# Patient Record
Sex: Female | Born: 1940 | Race: White | Hispanic: No | Smoking: Never smoker
Health system: Southern US, Community
[De-identification: ages and names within clinical notes are randomized; demographics above are authoritative.]

## PROBLEM LIST (undated history)

## (undated) DIAGNOSIS — G8929 Other chronic pain: Secondary | ICD-10-CM

## (undated) DIAGNOSIS — J8410 Pulmonary fibrosis, unspecified: Secondary | ICD-10-CM

## (undated) DIAGNOSIS — F419 Anxiety disorder, unspecified: Secondary | ICD-10-CM

## (undated) DIAGNOSIS — M109 Gout, unspecified: Secondary | ICD-10-CM

## (undated) DIAGNOSIS — I5032 Chronic diastolic (congestive) heart failure: Secondary | ICD-10-CM

## (undated) DIAGNOSIS — I10 Essential (primary) hypertension: Secondary | ICD-10-CM

## (undated) DIAGNOSIS — Z9981 Dependence on supplemental oxygen: Secondary | ICD-10-CM

## (undated) DIAGNOSIS — M1990 Unspecified osteoarthritis, unspecified site: Secondary | ICD-10-CM

## (undated) DIAGNOSIS — H409 Unspecified glaucoma: Secondary | ICD-10-CM

## (undated) DIAGNOSIS — I519 Heart disease, unspecified: Secondary | ICD-10-CM

## (undated) DIAGNOSIS — R7303 Prediabetes: Secondary | ICD-10-CM

## (undated) DIAGNOSIS — I4891 Unspecified atrial fibrillation: Secondary | ICD-10-CM

## (undated) DIAGNOSIS — J449 Chronic obstructive pulmonary disease, unspecified: Secondary | ICD-10-CM

## (undated) DIAGNOSIS — M069 Rheumatoid arthritis, unspecified: Secondary | ICD-10-CM

## (undated) DIAGNOSIS — F329 Major depressive disorder, single episode, unspecified: Secondary | ICD-10-CM

## (undated) DIAGNOSIS — I5031 Acute diastolic (congestive) heart failure: Secondary | ICD-10-CM

## (undated) DIAGNOSIS — G4730 Sleep apnea, unspecified: Secondary | ICD-10-CM

## (undated) DIAGNOSIS — K5790 Diverticulosis of intestine, part unspecified, without perforation or abscess without bleeding: Secondary | ICD-10-CM

## (undated) DIAGNOSIS — E785 Hyperlipidemia, unspecified: Secondary | ICD-10-CM

## (undated) DIAGNOSIS — I499 Cardiac arrhythmia, unspecified: Secondary | ICD-10-CM

## (undated) DIAGNOSIS — F32A Depression, unspecified: Secondary | ICD-10-CM

## (undated) DIAGNOSIS — G629 Polyneuropathy, unspecified: Secondary | ICD-10-CM

## (undated) DIAGNOSIS — N1831 Chronic kidney disease, stage 3a: Secondary | ICD-10-CM

## (undated) DIAGNOSIS — H3530 Unspecified macular degeneration: Secondary | ICD-10-CM

## (undated) DIAGNOSIS — K219 Gastro-esophageal reflux disease without esophagitis: Secondary | ICD-10-CM

## (undated) DIAGNOSIS — M545 Low back pain, unspecified: Secondary | ICD-10-CM

## (undated) DIAGNOSIS — E039 Hypothyroidism, unspecified: Secondary | ICD-10-CM

## (undated) DIAGNOSIS — R0600 Dyspnea, unspecified: Secondary | ICD-10-CM

## (undated) DIAGNOSIS — Z9289 Personal history of other medical treatment: Secondary | ICD-10-CM

## (undated) DIAGNOSIS — Z950 Presence of cardiac pacemaker: Secondary | ICD-10-CM

## (undated) HISTORY — PX: LUMBAR LAMINECTOMY: SHX95

## (undated) HISTORY — DX: Major depressive disorder, single episode, unspecified: F32.9

## (undated) HISTORY — PX: TOTAL ABDOMINAL HYSTERECTOMY: SHX209

## (undated) HISTORY — DX: Hyperlipidemia, unspecified: E78.5

## (undated) HISTORY — DX: Chronic kidney disease, stage 3a: N18.31

## (undated) HISTORY — DX: Depression, unspecified: F32.A

## (undated) HISTORY — PX: DILATION AND CURETTAGE OF UTERUS: SHX78

## (undated) HISTORY — PX: BACK SURGERY: SHX140

## (undated) HISTORY — DX: Gastro-esophageal reflux disease without esophagitis: K21.9

## (undated) HISTORY — DX: Chronic diastolic (congestive) heart failure: I50.32

## (undated) HISTORY — DX: Unspecified osteoarthritis, unspecified site: M19.90

## (undated) HISTORY — DX: Essential (primary) hypertension: I10

## (undated) HISTORY — DX: Diverticulosis of intestine, part unspecified, without perforation or abscess without bleeding: K57.90

## (undated) HISTORY — DX: Unspecified glaucoma: H40.9

## (undated) HISTORY — PX: NEPHRECTOMY TRANSPLANTED ORGAN: SUR880

## (undated) HISTORY — DX: Anxiety disorder, unspecified: F41.9

## (undated) HISTORY — DX: Hypothyroidism, unspecified: E03.9

## (undated) HISTORY — PX: TUBAL LIGATION: SHX77

## (undated) HISTORY — DX: Heart disease, unspecified: I51.9

## (undated) HISTORY — PX: BREAST LUMPECTOMY: SHX2

## (undated) HISTORY — PX: CATARACT EXTRACTION W/ INTRAOCULAR LENS  IMPLANT, BILATERAL: SHX1307

---

## 1981-04-21 DIAGNOSIS — Z9289 Personal history of other medical treatment: Secondary | ICD-10-CM

## 1981-04-21 HISTORY — DX: Personal history of other medical treatment: Z92.89

## 1981-04-21 HISTORY — PX: NEPHRECTOMY LIVING DONOR: SUR877

## 1997-10-17 ENCOUNTER — Encounter: Admission: RE | Admit: 1997-10-17 | Discharge: 1997-10-17 | Payer: Self-pay | Admitting: Internal Medicine

## 2000-03-16 ENCOUNTER — Encounter: Admission: RE | Admit: 2000-03-16 | Discharge: 2000-03-16 | Payer: Self-pay | Admitting: Internal Medicine

## 2001-01-18 ENCOUNTER — Encounter: Admission: RE | Admit: 2001-01-18 | Discharge: 2001-01-18 | Payer: Self-pay | Admitting: Internal Medicine

## 2001-03-12 ENCOUNTER — Encounter: Admission: RE | Admit: 2001-03-12 | Discharge: 2001-03-12 | Payer: Self-pay | Admitting: Internal Medicine

## 2001-03-12 ENCOUNTER — Encounter: Payer: Self-pay | Admitting: Internal Medicine

## 2001-03-12 ENCOUNTER — Ambulatory Visit (HOSPITAL_COMMUNITY): Admission: RE | Admit: 2001-03-12 | Discharge: 2001-03-12 | Payer: Self-pay | Admitting: Internal Medicine

## 2002-06-21 ENCOUNTER — Ambulatory Visit (HOSPITAL_COMMUNITY): Admission: RE | Admit: 2002-06-21 | Discharge: 2002-06-21 | Payer: Self-pay | Admitting: Family Medicine

## 2003-02-01 ENCOUNTER — Encounter: Admission: RE | Admit: 2003-02-01 | Discharge: 2003-02-01 | Payer: Self-pay | Admitting: Internal Medicine

## 2003-02-14 ENCOUNTER — Encounter: Admission: RE | Admit: 2003-02-14 | Discharge: 2003-02-14 | Payer: Self-pay | Admitting: Internal Medicine

## 2003-02-22 ENCOUNTER — Encounter (INDEPENDENT_AMBULATORY_CARE_PROVIDER_SITE_OTHER): Payer: Self-pay | Admitting: Specialist

## 2003-02-22 ENCOUNTER — Other Ambulatory Visit: Admission: RE | Admit: 2003-02-22 | Discharge: 2003-02-22 | Payer: Self-pay | Admitting: Internal Medicine

## 2003-02-22 ENCOUNTER — Encounter: Admission: RE | Admit: 2003-02-22 | Discharge: 2003-02-22 | Payer: Self-pay | Admitting: Internal Medicine

## 2006-12-25 ENCOUNTER — Ambulatory Visit: Payer: Self-pay | Admitting: Internal Medicine

## 2007-01-12 ENCOUNTER — Ambulatory Visit (HOSPITAL_COMMUNITY): Admission: RE | Admit: 2007-01-12 | Discharge: 2007-01-12 | Payer: Self-pay | Admitting: Internal Medicine

## 2007-01-12 ENCOUNTER — Ambulatory Visit: Payer: Self-pay | Admitting: Cardiology

## 2007-01-14 ENCOUNTER — Encounter: Payer: Self-pay | Admitting: Critical Care Medicine

## 2007-01-14 ENCOUNTER — Ambulatory Visit: Payer: Self-pay | Admitting: Internal Medicine

## 2007-01-14 LAB — CONVERTED CEMR LAB
Anti Nuclear Antibody(ANA): NEGATIVE
BUN: 14 mg/dL (ref 6–23)
Basophils Absolute: 0.1 10*3/uL (ref 0.0–0.1)
Basophils Relative: 0.8 % (ref 0.0–1.0)
CO2: 32 meq/L (ref 19–32)
Calcium: 9.7 mg/dL (ref 8.4–10.5)
Chloride: 107 meq/L (ref 96–112)
Creatinine, Ser: 0.9 mg/dL (ref 0.4–1.2)
Eosinophils Absolute: 0.2 10*3/uL (ref 0.0–0.6)
Eosinophils Relative: 2.5 % (ref 0.0–5.0)
GFR calc Af Amer: 81 mL/min
GFR calc non Af Amer: 67 mL/min
Glucose, Bld: 93 mg/dL (ref 70–99)
HCT: 37 % (ref 36.0–46.0)
Hemoglobin: 13.3 g/dL (ref 12.0–15.0)
Lymphocytes Relative: 23.9 % (ref 12.0–46.0)
MCHC: 36 g/dL (ref 30.0–36.0)
MCV: 89.4 fL (ref 78.0–100.0)
Monocytes Absolute: 0.6 10*3/uL (ref 0.2–0.7)
Monocytes Relative: 6.4 % (ref 3.0–11.0)
Neutro Abs: 5.7 10*3/uL (ref 1.4–7.7)
Neutrophil cytoplasmic antibodies,IgG,serum: <1:20 {titer}
Neutrophils Relative %: 66.4 % (ref 43.0–77.0)
Platelets: 268 10*3/uL (ref 150–400)
Potassium: 4.6 meq/L (ref 3.5–5.1)
RBC: 4.14 M/uL (ref 3.87–5.11)
RDW: 12.8 % (ref 11.5–14.6)
Rhuematoid fact SerPl-aCnc: <20 intl units/mL — ABNORMAL LOW (ref 0.0–20.0)
Sodium: 146 meq/L — ABNORMAL HIGH (ref 135–145)
TSH: 4.49 microintl units/mL (ref 0.35–5.50)
WBC: 8.7 10*3/uL (ref 4.5–10.5)

## 2007-02-08 DIAGNOSIS — I10 Essential (primary) hypertension: Secondary | ICD-10-CM | POA: Insufficient documentation

## 2007-02-08 DIAGNOSIS — R059 Cough, unspecified: Secondary | ICD-10-CM | POA: Insufficient documentation

## 2007-02-08 DIAGNOSIS — R05 Cough: Secondary | ICD-10-CM | POA: Insufficient documentation

## 2007-02-08 DIAGNOSIS — R0602 Shortness of breath: Secondary | ICD-10-CM | POA: Insufficient documentation

## 2007-02-08 DIAGNOSIS — K219 Gastro-esophageal reflux disease without esophagitis: Secondary | ICD-10-CM | POA: Insufficient documentation

## 2007-02-08 DIAGNOSIS — J8410 Pulmonary fibrosis, unspecified: Secondary | ICD-10-CM | POA: Insufficient documentation

## 2007-02-08 DIAGNOSIS — I152 Hypertension secondary to endocrine disorders: Secondary | ICD-10-CM | POA: Insufficient documentation

## 2007-02-08 DIAGNOSIS — J984 Other disorders of lung: Secondary | ICD-10-CM | POA: Insufficient documentation

## 2007-02-08 DIAGNOSIS — E1159 Type 2 diabetes mellitus with other circulatory complications: Secondary | ICD-10-CM | POA: Insufficient documentation

## 2008-01-29 ENCOUNTER — Encounter: Admission: RE | Admit: 2008-01-29 | Discharge: 2008-01-29 | Payer: Self-pay | Admitting: Family Medicine

## 2008-06-23 ENCOUNTER — Inpatient Hospital Stay (HOSPITAL_COMMUNITY): Admission: RE | Admit: 2008-06-23 | Discharge: 2008-06-27 | Payer: Self-pay | Admitting: Neurosurgery

## 2010-08-01 LAB — BASIC METABOLIC PANEL
BUN: 13 mg/dL (ref 6–23)
CO2: 28 mEq/L (ref 19–32)
Calcium: 9.7 mg/dL (ref 8.4–10.5)
Chloride: 104 mEq/L (ref 96–112)
Creatinine, Ser: 1.06 mg/dL (ref 0.4–1.2)
GFR calc Af Amer: >60 mL/min (ref >60)
GFR calc non Af Amer: 52 mL/min — ABNORMAL LOW (ref >60)
Glucose, Bld: 92 mg/dL (ref 70–99)
Potassium: 4.2 mEq/L (ref 3.5–5.1)
Sodium: 139 mEq/L (ref 135–145)

## 2010-08-01 LAB — CBC
HCT: 40 % (ref 36.0–46.0)
Hemoglobin: 14 g/dL (ref 12.0–15.0)
MCHC: 35 g/dL (ref 30.0–36.0)
MCV: 90.2 fL (ref 78.0–100.0)
Platelets: 281 10*3/uL (ref 150–400)
RBC: 4.44 MIL/uL (ref 3.87–5.11)
RDW: 12.5 % (ref 11.5–15.5)
WBC: 9.5 10*3/uL (ref 4.0–10.5)

## 2010-08-01 LAB — TYPE AND SCREEN
ABO/RH(D): B POS
Antibody Screen: NEGATIVE

## 2010-08-01 LAB — ABO/RH: ABO/RH(D): B POS

## 2010-09-03 NOTE — Op Note (Signed)
NAMESHANVI, [name] NO.:  [id number]   MEDICAL RECORD NO.:  [id number]          PATIENT TYPE:  INP   LOCATION:  3004                         FACILITY:  [facility]   PHYSICIAN:  Danae Orleans. Venetia Maxon, M.D.  DATE OF BIRTH:  08/09/1940   DATE OF PROCEDURE:  06/23/2008  DATE OF DISCHARGE:                               OPERATIVE REPORT   PREOPERATIVE DIAGNOSIS:  Recurrent herniated lumbar disk with  spondylosis, degenerative disk disease and radiculopathy L4-5 and L5-S1  levels.   POSTOPERATIVE DIAGNOSIS:  Recurrent herniated lumbar disk with  spondylosis, degenerative disk disease and radiculopathy L4-5 and L5-S1  levels.   PROCEDURES:  1. Redo laminectomy L4-5.  2. Diskectomy L4-5 and decompression more extensive than typical for      fusion.  3. Decompressive laminectomy L5-S1.  4. Transforaminal lumbar interbody fusion with PEEK interbody cage,      morselized bone autograft and allograft and EquivaBone at L4-5.  5. Pedicle screw fixation bilaterally L4 through S1 (segmental      fixation).  6. Posterolateral arthrodesis with bone autograft, BMP, and      EquivaBone.   SURGEON:  Danae Orleans. Venetia Maxon, MD   ASSISTANTS:  Georgiann Cocker, RN and Cristi Loron, MD   ANESTHESIA:  General endotracheal anesthesia.   ESTIMATED BLOOD LOSS:  Minimal.   COMPLICATIONS:  None.   DISPOSITION:  Recovery.   INDICATION:  Lorraine Leonard is a 70 year old woman who has previously  undergone lumbar laminectomy at L4-5 level and now has a large recurrent  disk herniation at L4-5 with severe spondylosis, foraminal nerve root  compression and is complaining of severe left leg pain.  Additionally,  she has a large central to left-sided disk herniation at the L5-S1  level.  It was elected to take her to surgery for decompression and  fusion at these two affected levels.  She complains of significant back  pain and she has a significant amount of disk space height loss and  modic changes  in the endplates at both of these levels.  She is morbidly  obese.  It was elected to proceed with decompression and fusion at these  affected levels.   PROCEDURE:  Lorraine Leonard was brought to the operating room.  Following a  satisfactory and uncomplicated induction of general endotracheal  anesthesia plus intravenous lines and Foley catheter, the patient was  placed in a prone position on the [place] table.  Soft tissue and the  bony prominences were padded appropriately.  Her low back was prepped  and draped and the area of planned incision was infiltrated with local  lidocaine.  Incision was made overlying the L4 to sacral spinous  processes and carried sharply through subcutaneous fat to the  lumbodorsal fascia in the previously scarred area.  Subperiosteal  dissection was performed exposing the L4-L5 transverse processes  bilaterally and sacral ala.  There was significant amount of scar tissue  overlying the L4-5 level.  Intraoperative x-ray confirmed correct  orientation of the L4 with marker probes at the L4 through S1 level.  On  the left side  of midline, a hemilaminectomy of L4 was performed with  high-speed drill and additionally at L5-S1 a similar decompression was  performed.  Extensive decompression of the L5 nerve root, lateral aspect  of the thecal sac was performed at the L4-5 level and also extensive  decompression at the L5-S1 level.  The disk herniation at L5-S1 was  large and calcified and there was not a lot of movement at this level.  It therefore elected not to perform a diskectomy at this level.  However, at L4-5, there was a significant amount of herniated disk  material with marked compression of the L5 nerve root and the nerve root  was decompressed.  A thorough diskectomy was performed and the inner  endplates were stripped of residual disk material and after trial  sizing, an 8 mm PEEK interbody TLIF cage was packed with BMP and  EquivaBone.  Additional BMP  and EquivaBone were placed deep within the  interspace and the implant was then tamped into position and rotated  appropriately and intraoperative x-ray demonstrated well-positioned  interbody graft.  Additional bone autograft was placed overlying the  implanted cage.  This was tamped into position.  Subsequently, pedicle  screw fixation was used from L4 through S1 levels with 6.5 x 40-mm  screws at S1, 5.5 x 45-mm screws at L5 and 5.5 x 50-mm screws at L4.  All screws had excellent purchase and positioning was confirmed on AP  and lateral fluoroscopy.  There were no cutouts.  Preloaded rods were  affixed to the heads of the screws prior to doing so.  The right  posterolateral region was extensively decorticated and the remaining  BMP, EquivaBone and autograft was placed in this location and tamped  into position.  The rods were locked down in situ.  Self-retaining  retractor was removed.  The lumbodorsal fascia was closed with #1 Vicryl  sutures, subcutaneous tissues were reapproximated with 2-0 Vicryl  interrupted inverted sutures and skin edges were reapproximated with  interrupted 3-0 Vicryl subcuticular stitch.  The wound was dressed with  benzoin, Steri-Strips, Telfa gauze and tape.  The patient was extubated  in the operating room and taken to recovery in stable satisfactory  condition having tolerated the operation well.  All counts were correct  at the end of the case.      Danae Orleans. Venetia Maxon, M.D.  Electronically Signed     JDS/MEDQ  D:  06/23/2008  T:  06/24/2008  Job:  [id number]

## 2010-09-03 NOTE — Letter (Signed)
December 25, 2006    Lorraine Leonard, M.D. [facility]  [address]  [place], [place] [address]   RE:  Lorraine, Leonard  MRN:  [id number]  /  DOB:  06/03/1940   Dear Dr. Corinda Gubler:   CHIEF COMPLAINT:  Cough and shortness of breath.   Thank you for referring Lorraine Leonard to my clinic.  It was a pleasure to  see her today for symptoms of cough and also for evaluation of possible  pulmonary fibrosis.  Lorraine Leonard tells me that she has had cough for the  past 1 year, it is a dry cough, and occasionally she brings up some  clear mucus.  She says that she has had sinus drainage through her nose  and down to her throat life-long and she thinks her cough is unrelated  to this, although she does admit that the cough is worse at night more  than during the day, particularly when she lies down.  She has taken a  prednisone course and she has seen Dewain Penning for her lungs.   For the last several months she has also had shortness of breath,  particularly while doing laundry and routine stuff and this is  associated with some wheezing, and for the last few weeks she has had  some tiredness.   She denies any hemoptysis, paroxysmal nocturnal dyspnea, fever, chills,  edema, orthopnea or wheezing.   She tells me that she presented to your clinic for cough on September  3rd and during her physical exam she was found to have some left lower  lobe crackles.  This was followed up by a chest x-ray and CT scan which  showed some fibrosis and that is why she is here.  She is on a  prednisone course that started December 22, 2006, and this will finish  up in 3 weeks.   PAST MEDICAL HISTORY:  1. Hypertension.  2. Postnasal drip that has been present life-long.   PAST SURGICAL HISTORY:  1. Status post back surgery in 1995.  2. Status post hysterectomy in 1980.  3. Status post tubal ligation in 1973.  4. Status post fiberoptic breast lump removal.  5. 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   ALLERGIES:  No known drug allergies.   CURRENT MEDICATIONS:  Hyzaar, Nasacort, Allegra, Singulair, Astelin,  citalopram, Nexium, gabapentin, multivitamin and Fish Oil.   SOCIAL HISTORY:  1. She states that she has never smoked.  2. She is married, lives with her children.  3. Her occupation is a housewife.  4. She has never been a passive smoker.  5. No recent travel.   FAMILY HISTORY:  1. Three of her sisters had lung cancer.  2. Her father had emphysema.  3. One of her sons and one sister has allergies.  4. There is family history of asthma.  5. Her Mom had some clotting disorders.  6. Sister had rheumatism.   REVIEW OF SYSTEMS:  Positive for snoring and excessive daytime  somnolence.  Other than that is positive for the current cough and  shortness of breath.  Rest of review of systems was negative.   PHYSICAL EXAM:  A weight of 210.8 pounds, temperature 98.1, blood  pressure 132/80, pulse of 61, oxygen saturation 96% on room air.  GENERAL EXAM:  An obese female seated comfortably in the examining room.  PSYCHIATRIC:  Flat affect.  CENTRAL NERVOUS SYSTEM:  Alert and oriented x3, speech and ambulation  are normal.  CARDIOVASCULAR:  Normal heart sounds, no murmurs, no gallops.  RESPIRATORY EXAM:  Air entry equal on both sides.  Left lower lobe  crackles present.  There is also a possibility of crackles on the right  lower lobe.  HEENT EXAM:  No neck nodes, supple neck, no elevated JVP.  ABDOMEN:  Obese, soft, nontender.  EXTREMITIES:  No cyanosis, no clubbing, no pedal edema.  SKIN:  Skin is intact.   LABORATORY EVALUATION:  A CT scan of the chest.  She brought with her  the original CT scan of the chest.  These are non-high resolution CT  scans of the chest.  I personally reviewed them.  They were done at the  [facility].  The report is as follows:  1. Mild coarse and interstitial markings with peripheral cystic change      in the left lung base.   I personally saw some interstitial      markings, but I am not sure there is any honeycombing there.  2. Minimal streaky opacities in the lung apices bilaterally without      osseous involvement.  I agree with this.  3. Small bilateral lung nodules, one 3 mm right upper lobe on image      21.  I do not see this.  4. A 2 mm posterior left upper lobe adjacent to the fissure, image 24.      I agree with this.  5. A 6 x 4 mm nodule right lateral lower lobe in image 39.  I agree      with this.  6. One borderline pretracheal node less than 1 cm.  I agree with this.  7. 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   ASSESSMENT AND PLAN:  In summary, this is a 70 year old nonsmoker who  has had chronic cough for a year and shortness of breath and wheezing  for a few months who has crackles on her exam and a CT scan of the chest  that shows some left lower lobe scarring.  The CT scan also shows  presence of some real small micronodules but she is at low risk for  cancer, especially due to the fact she does not smoke.  Her problems are  as follows:  1. Nodules.  These are micronodules and then she is also low risk for      lung cancer because of her smoking history, but she feels she is at      high risk because of her family history.  Nevertheless, these      nodules are too small to be characterized and by the Fleischner      criteria her next CT scan can be done only a year from now. Since      she is a bit more anxious to wait one year, she has agreed to have      a CT scan in 9 months and I will do a high resolution CT scan of      the chest.   1. Fibrosis.  I am unclear what the cause of this fibrosis is.  It      seems unilateral and in one base.  There is no clear evidence of      honeycombing.  This could be early UIP.  Will need a high      resolution CT scan of the chest to further characterize this      fibrosis.  Currently, there is  no urgency to get this CT scan of       the chest.  I, therefore, will get it within 6 to 9 months when she      has repeat CT for the nodules.   1. Cough.  She is to follow up with Dr. Stevphen Rochester for this.  The      cough could be related to fibrosis, but I will allow Dr. Corinda Gubler to      take care of the allergy symptoms and see what happens with the      cough before we pursue other etiologies.   1. Possible sleep apnea.  I offered a sleep study but she refused.   I will also get full set of pulmonary function tests and I will see her  in followup after that.    Sincerely,      Kalman Shan, MD  Electronically Signed    MR/MedQ  DD: 12/27/2006  DT: 12/27/2006  Job #: [id number]

## 2010-09-06 NOTE — Discharge Summary (Signed)
[name], Lorraine Leonard NO.:  [id number]   MEDICAL RECORD NO.:  [id number]          PATIENT TYPE:  INP   LOCATION:  3004                         FACILITY:  [facility]   PHYSICIAN:  Cristi Loron, M.D.DATE OF BIRTH:  11-Feb-1941   DATE OF ADMISSION:  06/23/2008  DATE OF DISCHARGE:  06/27/2008                               DISCHARGE SUMMARY   BRIEF HISTORY:  The patient is a 70 year old woman who has previously  undergone lumbar laminectomy at L4-L5 level and now has a large  recurrent disk herniation at L4-L5 with severe spondylosis, femoral  nerve root compression, and is complaining of severe left leg pain.  The  patient shows a large central to left-sided disk herniation at L5-S1 and  it was elected to take her to surgery for decompression and fusion at  these levels.  She complains of significant back pain and has had  significant amount of disk space height loss with Modic changes in the  endplates at both these levels.  She is morbidly obese, was elected to  proceed with decompression and fusion at these levels.   For further details of this admission, please refer to history and  physical.   HOSPITAL COURSE:  The patient was admitted to [facility] by Dr.  Venetia Maxon on June 23, 2008.  On day of admission, he performed L4-L5, L5-S1  decompression and fusion.  For further details of this operation, please  refer to typed operative note.   POSTOPERATIVE COURSE:  The patient's postoperative course was  unremarkable.  She was discharged to home June 27, 2008.   DISCHARGE INSTRUCTIONS:  The patient was given written discharge  instructions.  Instructed to follow up with Dr. Venetia Maxon.   DISCHARGE PRESCRIPTIONS:  1. Percocet 10/325, #100, 1 p.o. q.4 h. p.r.n. for pain.  2. Valium 5 mg 1 p.o. q.6 h. p.r.n. muscle spasms.   FINAL DIAGNOSES:  Recurrent disk herniation and spondylosis,  degenerative disk disease, radiculopathy of L4-L5 and L5-S1.   PROCEDURE  PERFORMED:  Redo laminectomy of L4-L5, discectomy at L4-L5  with decompression more extensive than typical for fusion, decompressive  laminectomy of L5-S1, transforaminal lumbar interbody fusion with PEEK  interbody cages, morselized bone autograft and allograft and EquivaBone  at L4-L5, pedicle screw fixation bilateral L4-S1 segmental  posterolateral arthrodesis, autograft bone, BMP, and EquivaBone.      Cristi Loron, M.D.  Electronically Signed    JDJ/MEDQ  D:  07/27/2008  T:  07/28/2008  Job:  [id number]

## 2010-09-06 NOTE — Letter (Signed)
January 20, 2007    Lorraine Leonard, M.D.  [address]  [place] [address]   RE:  Lorraine Leonard  MRN:  [id number]  /  DOB:  01-28-1941    PROBLEM LIST:  1. Micronodules on CT scan of the chest.  2. 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  3. Decreased diffusion capacity on pulmonary function tests September      2008.  Cardiopulmonary test is pending.  4. Dyspnea probably related to decreased diffusion and unclear      etiology.  Cardiopulmonary test is pending.  5. Cough probably related to fibrosis or other etiology not otherwise      specified.   Dear Dr. Corinda Gubler:   I saw Lorraine Leonard again on follow-up earlier than I had planned.  I was  concerned about the fibrosis.  Therefore, I decided to obtain a high  resolution scan of the chest.  I had noted that the image scans done at  [facility] were not high resolution cuts, so we obtained some high  resolution non-contrast CT scan of the chest here at [facility]  that were 10 mm cuts.  She returns for follow-up with results of the CT  scan and also undergoing pulmonary function test and 6-minute walk test.  In the interim, she reports no change in her complaints of shortness of  breath and cough since December 25, 2006.   PAST MEDICAL HISTORY:  No change other than the fact that she admitted  today that she has had bad acid reflux disease in the past and she has  been on Nexium with adequate control.   PAST SURGICAL HISTORY:  No change since December 25, 2006.   ALLERGIES:  No known drug allergies.   CURRENT MEDICATIONS:  No change since December 25, 2006.   SOCIAL HISTORY:  No change since December 25, 2006.   FAMILY HISTORY:  No change since December 25, 2006.   REVIEW OF SYSTEMS:  No change since December 25, 2006.   PHYSICAL EXAMINATION:  VITAL SIGNS:  Weight 209 pounds, temperature  98.5, blood pressure 128/80, pulse oximetry 95% on room air.  GENERAL:  Obese female  seated comfortably in the examination room.  PSYCHIATRIC:  Flat affect.  NEUROLOGY:  Alert and oriented x3.  Speech and ambulation are normal.  CARDIOVASCULAR:  Normal heart sounds, no murmurs and no gallops.  LUNGS:  Air entry equal in both sides.  Left lower lobe crackles present  as before.  HEENT:  No neck nodes, supple neck, no elevated JVP.  ABDOMEN:  Obese, soft, and nontender.  EXTREMITIES:  No cyanosis, no clubbing, and no pedal edema.  SKIN:  Intact.   LABORATORY DATA:  1. 6-minute walk test.  This was performed on January 12, 2007, at      [facility].  The technician noted that she walked a total      of 950 feet in 6 minutes.  Her pre-exercise heart rate was 78 with      a pulse oximetry of 97%.  At the end of 6 minutes, her heart rate      was 106 beats per minute, pulse oximetry stayed at 95% on room air.      This looks like a normal test, however, the interesting thing is      there is a notation that this test was done on 2 liters of oxygen.      I am currently unsure why  this was done on 2 liters of oxygen.  She      is normoxic on room air at rest and I never got a call about the      fact that she required oxygen.  The report does not say that oxygen      was initiated, so I do not know if this was a typographic area or      true need for oxygen.  If indeed there was a need for oxygen during      exercise, then this test is significant.  I will investigate this      further.   1. Full lung function test.  FEV1 2.43 102% predicted, FVC 3.1 liters      96% predicted, FEV1/FVC ratio normal at 78%, total lung capacity of      5.81 liters 106% predicted, DLCO corrected for hemoglobin was 16.7      mL/minute/mmHg which is 59% predicted.  This was reduced.  Hgb used      for estimate 13.4 grams%.   1. CT scan of the chest done on January 12, 2007.  Our radiologist      did not observe the left lower lobe fibrosis, although, when I      looked at it I felt  that it was still present.  They did not      observe any mediastinal or hilar adenopathy.  They noted the      presence of multiple micronodules, some of them calcified in both      lungs and they recommended follow-up in 9-12 months.   ASSESSMENT:  1. Shortness of breath.  Her dyspnea is out of proportion to her      pulmonary function and CT scan abnormalities.  Did not desaturate      in that 6-minute walk test. , I suspect some amount of      deconditioning could be present.  In any event, I will get a      cardiopulmonary excess test to clearly delineate the source of her      dyspnea because in her case, I think it is multifactorial.   1. Cough.  Again unclear etiology.  Certainly significant pulmonary      fibrosis can cause a cough.  We will continue to follow the cough      at next visit.   1. Isolated low DLCO.  Most common cause epidemiologically is COPD,      but she has never smoked and otherwise CT scan of the chest does      not show any emphysematous disease.  It is possible to isolate the      low DLCO from the second most important cause which is pulmonary      fibrosis.  I will get a cardiopulmonary excess test to see if this      is what is causing the dyspnea.  Also get complete blood count,      BMET, and TSH.   1. Pulmonary fibrosis.  At present, it looks localized to the left      lower lobe.  I would prefer to get  1mm cuts every 3 mm resolution      CT scan of the chest which we will do at follow-up in a years'      time.  Meanwhile look for secondary causes of fibrosis and plan to      get an ANA and anchor.  I suspect acid reflux  is a potential      etiology for her fibrosis particularly in that she gives a history      of today that she has had significant acid reflux in the past.   1. Micronodules.  There are many micronodules in the scan.      Radiologist have recommended a follow-up in one year.  We will get      another CT scan of the chest in one  year.    Sincerely,      Kalman Shan, MD  Electronically Signed    MR/MedQ  DD: 01/20/2007  DT: 01/21/2007  Job #: [id number]

## 2010-11-13 ENCOUNTER — Encounter (INDEPENDENT_AMBULATORY_CARE_PROVIDER_SITE_OTHER): Payer: Medicare Other | Admitting: Ophthalmology

## 2010-11-13 DIAGNOSIS — H43819 Vitreous degeneration, unspecified eye: Secondary | ICD-10-CM

## 2010-11-13 DIAGNOSIS — H353290 Exudative age-related macular degeneration, unspecified eye, stage unspecified: Secondary | ICD-10-CM

## 2010-11-13 DIAGNOSIS — H3530 Unspecified macular degeneration: Secondary | ICD-10-CM

## 2010-12-18 ENCOUNTER — Encounter (INDEPENDENT_AMBULATORY_CARE_PROVIDER_SITE_OTHER): Payer: Medicare Other | Admitting: Ophthalmology

## 2010-12-18 DIAGNOSIS — E11319 Type 2 diabetes mellitus with unspecified diabetic retinopathy without macular edema: Secondary | ICD-10-CM

## 2010-12-18 DIAGNOSIS — H353290 Exudative age-related macular degeneration, unspecified eye, stage unspecified: Secondary | ICD-10-CM

## 2010-12-18 DIAGNOSIS — H3530 Unspecified macular degeneration: Secondary | ICD-10-CM

## 2010-12-18 DIAGNOSIS — H43819 Vitreous degeneration, unspecified eye: Secondary | ICD-10-CM

## 2011-01-22 ENCOUNTER — Encounter (INDEPENDENT_AMBULATORY_CARE_PROVIDER_SITE_OTHER): Payer: Medicare Other | Admitting: Ophthalmology

## 2011-01-22 DIAGNOSIS — H2510 Age-related nuclear cataract, unspecified eye: Secondary | ICD-10-CM

## 2011-01-22 DIAGNOSIS — H3530 Unspecified macular degeneration: Secondary | ICD-10-CM

## 2011-01-22 DIAGNOSIS — H353290 Exudative age-related macular degeneration, unspecified eye, stage unspecified: Secondary | ICD-10-CM

## 2011-01-22 DIAGNOSIS — H43819 Vitreous degeneration, unspecified eye: Secondary | ICD-10-CM

## 2011-02-24 ENCOUNTER — Encounter (INDEPENDENT_AMBULATORY_CARE_PROVIDER_SITE_OTHER): Payer: Medicare Other | Admitting: Ophthalmology

## 2011-02-24 DIAGNOSIS — H43819 Vitreous degeneration, unspecified eye: Secondary | ICD-10-CM

## 2011-02-24 DIAGNOSIS — H3530 Unspecified macular degeneration: Secondary | ICD-10-CM

## 2011-02-24 DIAGNOSIS — H353290 Exudative age-related macular degeneration, unspecified eye, stage unspecified: Secondary | ICD-10-CM

## 2011-03-31 ENCOUNTER — Encounter (INDEPENDENT_AMBULATORY_CARE_PROVIDER_SITE_OTHER): Payer: Medicare Other | Admitting: Ophthalmology

## 2011-03-31 DIAGNOSIS — H43819 Vitreous degeneration, unspecified eye: Secondary | ICD-10-CM

## 2011-03-31 DIAGNOSIS — H2510 Age-related nuclear cataract, unspecified eye: Secondary | ICD-10-CM

## 2011-03-31 DIAGNOSIS — H3530 Unspecified macular degeneration: Secondary | ICD-10-CM

## 2011-03-31 DIAGNOSIS — H353290 Exudative age-related macular degeneration, unspecified eye, stage unspecified: Secondary | ICD-10-CM

## 2011-04-25 DIAGNOSIS — M25569 Pain in unspecified knee: Secondary | ICD-10-CM | POA: Diagnosis not present

## 2011-04-25 DIAGNOSIS — M549 Dorsalgia, unspecified: Secondary | ICD-10-CM | POA: Diagnosis not present

## 2011-04-25 DIAGNOSIS — IMO0002 Reserved for concepts with insufficient information to code with codable children: Secondary | ICD-10-CM | POA: Diagnosis not present

## 2011-05-02 ENCOUNTER — Encounter: Payer: Self-pay | Admitting: *Deleted

## 2011-05-05 ENCOUNTER — Encounter: Payer: Self-pay | Admitting: Cardiovascular Disease

## 2011-05-05 ENCOUNTER — Ambulatory Visit (INDEPENDENT_AMBULATORY_CARE_PROVIDER_SITE_OTHER): Payer: Medicare Other | Admitting: Cardiovascular Disease

## 2011-05-05 VITALS — BP 120/65 | HR 68 | Ht 68.0 in | Wt 246.0 lb

## 2011-05-05 DIAGNOSIS — I517 Cardiomegaly: Secondary | ICD-10-CM | POA: Insufficient documentation

## 2011-05-05 DIAGNOSIS — I10 Essential (primary) hypertension: Secondary | ICD-10-CM | POA: Diagnosis not present

## 2011-05-05 DIAGNOSIS — R0602 Shortness of breath: Secondary | ICD-10-CM | POA: Diagnosis not present

## 2011-05-05 DIAGNOSIS — R209 Unspecified disturbances of skin sensation: Secondary | ICD-10-CM | POA: Diagnosis not present

## 2011-05-05 DIAGNOSIS — R6889 Other general symptoms and signs: Secondary | ICD-10-CM | POA: Diagnosis not present

## 2011-05-05 DIAGNOSIS — R269 Unspecified abnormalities of gait and mobility: Secondary | ICD-10-CM | POA: Diagnosis not present

## 2011-05-05 DIAGNOSIS — D518 Other vitamin B12 deficiency anemias: Secondary | ICD-10-CM | POA: Diagnosis not present

## 2011-05-05 NOTE — Progress Notes (Signed)
70 yo referred by Dr Collins Scotland for cardiomegaly.  She is very overweight and debilitated.  Has chronic back and leg pain.  Seeing Dr Venetia Maxon next week. Recent cortisone shot.  I take care of her husband.  No previous cardiac history.  Chronic exertional dyspnea.  No SSCP, palpitations or syncope.  CRF HTN and elevated lipids.  On Rx.  Recent worsening of hives which she takes hydroxazine for  CXR reoport from office visit reviewed and indicates " minimally enlarged cardiac silloute".    ROS: Denies fever, malais, weight loss, blurry vision, decreased visual acuity, cough, sputum, SOB, hemoptysis, pleuritic pain, palpitaitons, heartburn, abdominal pain, melena, lower extremity edema, claudication, or rash.  All other systems reviewed and negative   General: Affect appropriate Healthy:  appears stated age HEENT: normal Neck supple with no adenopathy JVP normal no bruits no thyromegaly Lungs clear with no wheezing and good diaphragmatic motion Heart:  S1/S2 no murmur,rub, gallop or click PMI normal Abdomen: benighn, BS positve, no tenderness, no AAA no bruit.  No HSM or HJR Distal pulses intact with no bruits No edema Neuro non-focal Skin warm and dry No muscular weakness  Medications Current Outpatient Prescriptions  Medication Sig Dispense Refill  . albuterol (PROVENTIL HFA;VENTOLIN HFA) 108 (90 BASE) MCG/ACT inhaler Inhale 2 puffs into the lungs every 4 (four) hours as needed.      Marland Kitchen aspirin 81 MG tablet Take 160 mg by mouth daily.      Marland Kitchen atorvastatin (LIPITOR) 20 MG tablet Take 20 mg by mouth daily.      . Calcium Carbonate-Vitamin D (CALTRATE 600+D) 600-400 MG-UNIT per tablet Take 1 tablet by mouth daily.      . diazepam (VALIUM) 5 MG tablet Take 5 mg by mouth daily.      Marland Kitchen esomeprazole (NEXIUM) 40 MG capsule Take 40 mg by mouth daily before breakfast.      . hydrOXYzine (ATARAX/VISTARIL) 25 MG tablet Take 25 mg by mouth as needed.      Marland Kitchen levothyroxine (SYNTHROID, LEVOTHROID) 75 MCG  tablet Take 75 mcg by mouth daily.      Marland Kitchen losartan-hydrochlorothiazide (HYZAAR) 100-25 MG per tablet Take 1 tablet by mouth daily.      . mometasone (ELOCON) 0.1 % cream Apply 1 application topically 2 (two) times daily.      . multivitamin (THERAGRAN) per tablet Take 1 tablet by mouth daily.      . NON FORMULARY VAGROMJAX EYE DROPS, USE AFTER EYE INJECTIONS      . NON FORMULARY Apply to eye. Injection into eye every 5 wks      . pregabalin (LYRICA) 50 MG capsule Take 50 mg by mouth 3 (three) times daily.      Marland Kitchen trimethoprim (TRIMPEX) 100 MG tablet Take 100 mg by mouth daily.      . traMADol (ULTRAM) 50 MG tablet Take 50 mg by mouth 3 (three) times daily.        Allergies Review of patient's allergies indicates no known allergies.  Family History: Family History  Problem Relation Age of Onset  . Diabetes    . Hypertension    . Coronary artery disease    . Stroke    . Asthma    . Kidney disease Sister   . Lung cancer Sister   . Lung cancer Sister   . Suicidality Son     committed suicide in 2009  . Hyperlipidemia    . Anxiety disorder    . Migraines  Social History: History   Social History  . Marital Status: Married    Spouse Name: N/A    Number of Children: N/A  . Years of Education: N/A   Occupational History  . Not on file.   Social History Main Topics  . Smoking status: Never Smoker   . Smokeless tobacco: Not on file  . Alcohol Use: Not on file  . Drug Use: Not on file  . Sexually Active: Not on file   Other Topics Concern  . Not on file   Social History Narrative   Pt ONLY HAS ONE KIDNEY, she donated one of her kidneys to her sister    Electrocardiogram:  04/09/11  Nsr T wave inversion I and AVL  Today in our office NSR rate 68  T wave changes in 1,AVL no change  Assessment and Plan

## 2011-05-05 NOTE — Assessment & Plan Note (Signed)
Doubt significant cardiac issue.  ECG relatively normal and normal exam.  CXR likely AP and magnified from body habitus  Echo

## 2011-05-05 NOTE — Assessment & Plan Note (Signed)
Functional from obesity  ? History of pulmonary fibrosis although CXR doesn't mention this  Echo

## 2011-05-05 NOTE — Patient Instructions (Signed)
Your physician recommends that you schedule a follow-up appointment in: AS NEEDED Your physician recommends that you continue on your current medications as directed. Please refer to the Current Medication list given to you today.  Your physician has requested that you have an echocardiogram. Echocardiography is a painless test that uses sound waves to create images of your heart. It provides your doctor with information about the size and shape of your heart and how well your heart's chambers and valves are working. This procedure takes approximately one hour. There are no restrictions for this procedure. DX CARDIOMEAGALY

## 2011-05-05 NOTE — Assessment & Plan Note (Signed)
Well controlled.  Continue current medications and low sodium Dash type diet.    

## 2011-05-09 ENCOUNTER — Ambulatory Visit (HOSPITAL_COMMUNITY): Payer: Medicare Other | Attending: Cardiovascular Disease | Admitting: Radiology

## 2011-05-09 DIAGNOSIS — I10 Essential (primary) hypertension: Secondary | ICD-10-CM | POA: Insufficient documentation

## 2011-05-09 DIAGNOSIS — I517 Cardiomegaly: Secondary | ICD-10-CM

## 2011-05-09 DIAGNOSIS — R0609 Other forms of dyspnea: Secondary | ICD-10-CM | POA: Insufficient documentation

## 2011-05-09 DIAGNOSIS — R0989 Other specified symptoms and signs involving the circulatory and respiratory systems: Secondary | ICD-10-CM | POA: Diagnosis not present

## 2011-05-14 ENCOUNTER — Encounter (HOSPITAL_COMMUNITY): Payer: Self-pay | Admitting: *Deleted

## 2011-05-14 ENCOUNTER — Encounter (INDEPENDENT_AMBULATORY_CARE_PROVIDER_SITE_OTHER): Payer: Medicare Other | Admitting: Ophthalmology

## 2011-05-14 ENCOUNTER — Emergency Department (HOSPITAL_COMMUNITY)
Admission: EM | Admit: 2011-05-14 | Discharge: 2011-05-14 | Disposition: A | Discharge status: Home / Self Care | Payer: Medicare Other | Attending: Emergency Medicine | Admitting: Emergency Medicine

## 2011-05-14 ENCOUNTER — Emergency Department (HOSPITAL_COMMUNITY): Payer: Medicare Other

## 2011-05-14 DIAGNOSIS — K5730 Diverticulosis of large intestine without perforation or abscess without bleeding: Secondary | ICD-10-CM | POA: Diagnosis not present

## 2011-05-14 DIAGNOSIS — E039 Hypothyroidism, unspecified: Secondary | ICD-10-CM | POA: Diagnosis not present

## 2011-05-14 DIAGNOSIS — M545 Low back pain, unspecified: Secondary | ICD-10-CM | POA: Diagnosis not present

## 2011-05-14 DIAGNOSIS — R1032 Left lower quadrant pain: Secondary | ICD-10-CM | POA: Insufficient documentation

## 2011-05-14 DIAGNOSIS — R5381 Other malaise: Secondary | ICD-10-CM | POA: Insufficient documentation

## 2011-05-14 DIAGNOSIS — M5126 Other intervertebral disc displacement, lumbar region: Secondary | ICD-10-CM | POA: Diagnosis not present

## 2011-05-14 DIAGNOSIS — H2510 Age-related nuclear cataract, unspecified eye: Secondary | ICD-10-CM | POA: Diagnosis not present

## 2011-05-14 DIAGNOSIS — H3530 Unspecified macular degeneration: Secondary | ICD-10-CM

## 2011-05-14 DIAGNOSIS — R61 Generalized hyperhidrosis: Secondary | ICD-10-CM | POA: Insufficient documentation

## 2011-05-14 DIAGNOSIS — H353290 Exudative age-related macular degeneration, unspecified eye, stage unspecified: Secondary | ICD-10-CM | POA: Diagnosis not present

## 2011-05-14 DIAGNOSIS — K219 Gastro-esophageal reflux disease without esophagitis: Secondary | ICD-10-CM | POA: Insufficient documentation

## 2011-05-14 DIAGNOSIS — E119 Type 2 diabetes mellitus without complications: Secondary | ICD-10-CM | POA: Diagnosis not present

## 2011-05-14 DIAGNOSIS — E785 Hyperlipidemia, unspecified: Secondary | ICD-10-CM | POA: Diagnosis not present

## 2011-05-14 DIAGNOSIS — IMO0002 Reserved for concepts with insufficient information to code with codable children: Secondary | ICD-10-CM | POA: Diagnosis not present

## 2011-05-14 DIAGNOSIS — Z79899 Other long term (current) drug therapy: Secondary | ICD-10-CM | POA: Diagnosis not present

## 2011-05-14 DIAGNOSIS — H43819 Vitreous degeneration, unspecified eye: Secondary | ICD-10-CM

## 2011-05-14 DIAGNOSIS — Z7982 Long term (current) use of aspirin: Secondary | ICD-10-CM | POA: Diagnosis not present

## 2011-05-14 DIAGNOSIS — R110 Nausea: Secondary | ICD-10-CM | POA: Diagnosis not present

## 2011-05-14 LAB — DIFFERENTIAL
Basophils Absolute: 0.1 10*3/uL (ref 0.0–0.1)
Basophils Relative: 0 % (ref 0–1)
Eosinophils Absolute: 0.1 10*3/uL (ref 0.0–0.7)
Eosinophils Relative: 1 % (ref 0–5)
Lymphocytes Relative: 21 % (ref 12–46)
Lymphs Abs: 2.8 10*3/uL (ref 0.7–4.0)
Monocytes Absolute: 1 10*3/uL (ref 0.1–1.0)
Monocytes Relative: 8 % (ref 3–12)
Neutro Abs: 9.6 10*3/uL — ABNORMAL HIGH (ref 1.7–7.7)
Neutrophils Relative %: 70 % (ref 43–77)

## 2011-05-14 LAB — CBC
HCT: 41.9 % (ref 36.0–46.0)
Hemoglobin: 14.1 g/dL (ref 12.0–15.0)
MCH: 29.9 pg (ref 26.0–34.0)
MCHC: 33.7 g/dL (ref 30.0–36.0)
MCV: 88.8 fL (ref 78.0–100.0)
Platelets: 271 10*3/uL (ref 150–400)
RBC: 4.72 MIL/uL (ref 3.87–5.11)
RDW: 13.9 % (ref 11.5–15.5)
WBC: 13.6 10*3/uL — ABNORMAL HIGH (ref 4.0–10.5)

## 2011-05-14 LAB — COMPREHENSIVE METABOLIC PANEL
ALT: 34 U/L (ref 0–35)
AST: 36 U/L (ref 0–37)
Albumin: 3.8 g/dL (ref 3.5–5.2)
Alkaline Phosphatase: 115 U/L (ref 39–117)
BUN: 14 mg/dL (ref 6–23)
CO2: 26 mEq/L (ref 19–32)
Calcium: 10 mg/dL (ref 8.4–10.5)
Chloride: 101 mEq/L (ref 96–112)
Creatinine, Ser: 1.25 mg/dL — ABNORMAL HIGH (ref 0.50–1.10)
GFR calc Af Amer: 49 mL/min — ABNORMAL LOW (ref >90)
GFR calc non Af Amer: 43 mL/min — ABNORMAL LOW (ref >90)
Glucose, Bld: 114 mg/dL — ABNORMAL HIGH (ref 70–99)
Potassium: 3.7 mEq/L (ref 3.5–5.1)
Sodium: 138 mEq/L (ref 135–145)
Total Bilirubin: 0.4 mg/dL (ref 0.3–1.2)
Total Protein: 7.7 g/dL (ref 6.0–8.3)

## 2011-05-14 LAB — URINALYSIS, ROUTINE W REFLEX MICROSCOPIC
Bilirubin Urine: NEGATIVE
Glucose, UA: NEGATIVE mg/dL
Ketones, ur: NEGATIVE mg/dL
Leukocytes, UA: NEGATIVE
Nitrite: NEGATIVE
Protein, ur: NEGATIVE mg/dL
Specific Gravity, Urine: 1.009 (ref 1.005–1.030)
Urobilinogen, UA: 0.2 mg/dL (ref 0.0–1.0)
pH: 6 (ref 5.0–8.0)

## 2011-05-14 LAB — URINE MICROSCOPIC-ADD ON

## 2011-05-14 LAB — LIPASE, BLOOD: Lipase: 29 U/L (ref 11–59)

## 2011-05-14 MED ORDER — POLYETHYLENE GLYCOL 3350 17 G PO PACK: 17.0000 g | PACK | Freq: Every day | ORAL | Status: AC | PRN

## 2011-05-14 MED ORDER — ONDANSETRON HCL 4 MG/2ML IJ SOLN
4.0000 mg | Freq: Once | INTRAMUSCULAR | Status: AC
  Administered 2011-05-14: 4 mg via INTRAVENOUS
  Filled 2011-05-14: qty 2

## 2011-05-14 MED ORDER — MORPHINE SULFATE 4 MG/ML IJ SOLN
4.0000 mg | Freq: Once | INTRAMUSCULAR | Status: AC
Start: 2011-05-14 — End: 2011-05-14
  Administered 2011-05-14: 4 mg via INTRAVENOUS
  Filled 2011-05-14: qty 1

## 2011-05-14 MED ORDER — IOHEXOL 300 MG/ML  SOLN
100.0000 mL | Freq: Once | INTRAMUSCULAR | Status: AC | PRN
  Administered 2011-05-14: 100 mL via INTRAVENOUS

## 2011-05-14 MED ORDER — ONDANSETRON HCL 4 MG PO TABS: 4.0000 mg | ORAL_TABLET | Freq: Three times a day (TID) | ORAL | Status: AC | PRN

## 2011-05-14 MED ORDER — SODIUM CHLORIDE 0.9 % IV SOLN
999.0000 mL | INTRAVENOUS | Status: DC
  Administered 2011-05-14: 999 mL via INTRAVENOUS

## 2011-05-14 NOTE — ED Provider Notes (Signed)
History     CSN: [id number]  Arrival date & time 05/14/11  1618   First MD Initiated Contact with Patient 05/14/11 1754      Chief Complaint  Patient presents with  . Abdominal Pain    (Consider location/radiation/quality/duration/timing/severity/associated sxs/prior treatment) HPI Patient presented to the emergency room after being referred by her primary care Dr. Patient states she's been having abdominal pain ongoing since January 19. Patient states she's noted some nausea and has also noted some dark stools for a couple of days. Patient denies any vomiting or diarrhea. Her last bowel movement was normal in color but a little bit hard. Patient has also been general weakness. Patient states the pain seems to get worse with movement and exertion. Patient has not noticed any definite fevers but has been having some sweating at night. She denies any urinary symptoms. Nothing seems to make this pain any better. It is in the left lower quadrant and seems to radiate around that time  Past Medical History  Diagnosis Date  . Hypertension   . Diabetes mellitus     pre-diabetic  . Hyperlipidemia   . Allergic rhinitis   . Urticaria   . Hypothyroidism   . Anxiety   . GERD (gastroesophageal reflux disease)   . Lung fibrosis     Past Surgical History  Procedure Date  . Cervical discectomy   . 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 Diabetes    . Hypertension    . Coronary artery disease    . Stroke    . Asthma    . Kidney disease Sister   . Lung cancer Sister   . Lung cancer Sister   . Suicidality Son     committed suicide in 2009  . Hyperlipidemia    . Anxiety disorder    . Migraines      History  Substance Use Topics  . Smoking status: Never Smoker   . Smokeless tobacco: Not on file  . Alcohol Use: No    OB History    Grav Para Term Preterm Abortions TAB SAB Ect Mult Living                  Review  of Systems  All other systems reviewed and are negative.    Allergies  Review of patient's allergies indicates no known allergies.  Home Medications   Current Outpatient Rx  Name Route Sig Dispense Refill  . ALBUTEROL SULFATE HFA 108 (90 BASE) MCG/ACT IN AERS Inhalation Inhale 2 puffs into the lungs every 4 (four) hours as needed. For shortness of breath    . ASPIRIN 81 MG PO TABS Oral Take 81 mg by mouth daily.     . ATORVASTATIN CALCIUM 20 MG PO TABS Oral Take 20 mg by mouth daily.    Marland Kitchen CALCIUM CARBONATE-VITAMIN D 600-400 MG-UNIT PO TABS Oral Take 1 tablet by mouth daily.    Marland Kitchen DIAZEPAM 5 MG PO TABS Oral Take 5 mg by mouth daily.    Marland Kitchen ESOMEPRAZOLE MAGNESIUM 40 MG PO CPDR Oral Take 40 mg by mouth daily before breakfast.    . HYDROXYZINE HCL 25 MG PO TABS Oral Take 25 mg by mouth every 6 (six) hours as needed. For itching    . LEVOTHYROXINE SODIUM 75 MCG PO TABS Oral Take 75 mcg by mouth daily.    Marland Kitchen LOSARTAN POTASSIUM-HCTZ 100-25 MG PO  TABS Oral Take 1 tablet by mouth daily.    Marland Kitchen MAGNESIUM OXIDE 400 MG PO TABS Oral Take 400 mg by mouth daily.    . MOMETASONE FUROATE 0.1 % EX CREA Topical Apply 1 application topically 2 (two) times daily.    Marland Kitchen MOXIFLOXACIN HCL 0.5 % OP SOLN Right Eye Place 1 drop into the right eye See admin instructions. Pt takes three times daily for 2 days after eye injection    . MULTIVITAMINS PO TABS Oral Take 1 tablet by mouth daily.    . NON FORMULARY Right Eye Place 1 Syringe into the right eye every 5 (five) weeks. Injection into eye every 5 wks    . ONDANSETRON HCL 8 MG PO TABS Oral Take 8 mg by mouth every 8 (eight) hours as needed. For nausea    . TRAMADOL HCL 50 MG PO TABS Oral Take 50 mg by mouth 3 (three) times daily.    Marland Kitchen TRIMETHOPRIM 100 MG PO TABS Oral Take 100 mg by mouth daily.      BP 143/57  Pulse 76  Temp(Src) 98.2 F (36.8 C) (Oral)  Resp 19  SpO2 96%  Physical Exam  Nursing note and vitals reviewed. Constitutional: She appears  well-developed and well-nourished. No distress.  HENT:  Head: Normocephalic and atraumatic.  Right Ear: External ear normal.  Left Ear: External ear normal.  Eyes: Conjunctivae are normal. Right eye exhibits no discharge. Left eye exhibits no discharge. No scleral icterus.  Neck: Neck supple. No tracheal deviation present.  Cardiovascular: Normal rate, regular rhythm and intact distal pulses.   Pulmonary/Chest: Effort normal and breath sounds normal. No stridor. No respiratory distress. She has no wheezes. She has no rales.  Abdominal: Soft. Bowel sounds are normal. She exhibits no distension. There is tenderness in the left lower quadrant. There is no rigidity, no rebound and no guarding.  Musculoskeletal: She exhibits no edema and no tenderness.  Neurological: She is alert. She has normal strength. No sensory deficit. Cranial nerve deficit:  no gross defecits noted. She exhibits normal muscle tone. She displays no seizure activity. Coordination normal.  Skin: Skin is warm and dry. No rash noted.  Psychiatric: She has a normal mood and affect.    ED Course  Procedures (including critical care time)  Medications  ondansetron (ZOFRAN) 8 MG tablet (not administered)  moxifloxacin (VIGAMOX) 0.5 % ophthalmic solution (not administered)  magnesium oxide (MAG-OX) 400 MG tablet (not administered)  ondansetron (ZOFRAN) 4 MG tablet (not administered)  polyethylene glycol (MIRALAX) packet (not administered)  ondansetron (ZOFRAN) injection 4 mg (4 mg Intravenous Given 05/14/11 1857)  morphine 4 MG/ML injection 4 mg (4 mg Intravenous Given 05/14/11 1859)  iohexol (OMNIPAQUE) 300 MG/ML solution 100 mL (100 mL Intravenous Contrast Given 05/14/11 2054)    Labs Reviewed  CBC - Abnormal; Notable for the following:    WBC 13.6 (*)    All other components within normal limits  DIFFERENTIAL - Abnormal; Notable for the following:    Neutro Abs 9.6 (*)    All other components within normal limits    COMPREHENSIVE METABOLIC PANEL - Abnormal; Notable for the following:    Glucose, Bld 114 (*)    Creatinine, Ser 1.25 (*)    GFR calc non Af Amer 43 (*)    GFR calc Af Amer 49 (*)    All other components within normal limits  URINALYSIS, ROUTINE W REFLEX MICROSCOPIC - Abnormal; Notable for the following:    Hgb urine dipstick  TRACE (*)    All other components within normal limits  URINE MICROSCOPIC-ADD ON - Abnormal; Notable for the following:    Squamous Epithelial / LPF FEW (*)    Casts HYALINE CASTS (*)    All other components within normal limits  LIPASE, BLOOD  LAB REPORT - SCANNED  URINE CULTURE         MDM  Pt with abdominal pain since the 19th. Concerning for diverticulitis.  Sent to ed by pcp..  Ct scan ordered.  Case discussed with PA West in the cdu to follow up on CT scan results and disposition accordingly.        Celene Kras, MD 05/15/11 [id number]

## 2011-05-14 NOTE — ED Notes (Signed)
Returned from ct 

## 2011-05-14 NOTE — ED Notes (Signed)
LLQ since 1/19, sent by PCP for further evaluation.  Pt has has had black stools,.  No fever or chills with this.  Pt has had nausea, no vomiting

## 2011-05-14 NOTE — ED Notes (Signed)
Patient transported to CT 

## 2011-05-15 ENCOUNTER — Telehealth: Payer: Self-pay | Admitting: Cardiovascular Disease

## 2011-05-15 NOTE — Telephone Encounter (Signed)
Results of echo were given to Lorraine Leonard.

## 2011-05-15 NOTE — Telephone Encounter (Signed)
New Msg: pt calling wanting to know results of pt ECHO. Please return pt call to discuss further.  

## 2011-05-16 DIAGNOSIS — H02059 Trichiasis without entropian unspecified eye, unspecified eyelid: Secondary | ICD-10-CM | POA: Diagnosis not present

## 2011-05-19 DIAGNOSIS — R269 Unspecified abnormalities of gait and mobility: Secondary | ICD-10-CM | POA: Diagnosis not present

## 2011-05-19 DIAGNOSIS — R209 Unspecified disturbances of skin sensation: Secondary | ICD-10-CM | POA: Diagnosis not present

## 2011-05-23 DIAGNOSIS — R109 Unspecified abdominal pain: Secondary | ICD-10-CM | POA: Diagnosis not present

## 2011-05-23 DIAGNOSIS — R1032 Left lower quadrant pain: Secondary | ICD-10-CM | POA: Diagnosis not present

## 2011-05-23 DIAGNOSIS — R110 Nausea: Secondary | ICD-10-CM | POA: Diagnosis not present

## 2011-05-23 DIAGNOSIS — Z79899 Other long term (current) drug therapy: Secondary | ICD-10-CM | POA: Diagnosis not present

## 2011-05-23 DIAGNOSIS — R5383 Other fatigue: Secondary | ICD-10-CM | POA: Diagnosis not present

## 2011-05-23 DIAGNOSIS — M064 Inflammatory polyarthropathy: Secondary | ICD-10-CM | POA: Diagnosis not present

## 2011-05-23 DIAGNOSIS — R5381 Other malaise: Secondary | ICD-10-CM | POA: Diagnosis not present

## 2011-06-16 DIAGNOSIS — N8111 Cystocele, midline: Secondary | ICD-10-CM | POA: Diagnosis not present

## 2011-06-16 DIAGNOSIS — N3020 Other chronic cystitis without hematuria: Secondary | ICD-10-CM | POA: Diagnosis not present

## 2011-06-17 DIAGNOSIS — D649 Anemia, unspecified: Secondary | ICD-10-CM | POA: Diagnosis not present

## 2011-06-17 DIAGNOSIS — N2581 Secondary hyperparathyroidism of renal origin: Secondary | ICD-10-CM | POA: Diagnosis not present

## 2011-06-17 DIAGNOSIS — N183 Chronic kidney disease, stage 3 unspecified: Secondary | ICD-10-CM | POA: Diagnosis not present

## 2011-06-18 ENCOUNTER — Ambulatory Visit
Admission: RE | Admit: 2011-06-18 | Discharge: 2011-06-18 | Disposition: A | Discharge status: Home / Self Care | Payer: Medicare Other | Source: Ambulatory Visit | Attending: Nephrology | Admitting: Nephrology

## 2011-06-18 ENCOUNTER — Other Ambulatory Visit: Payer: Self-pay | Admitting: Nephrology

## 2011-06-18 ENCOUNTER — Encounter (INDEPENDENT_AMBULATORY_CARE_PROVIDER_SITE_OTHER): Payer: Medicare Other | Admitting: Ophthalmology

## 2011-06-18 DIAGNOSIS — H43819 Vitreous degeneration, unspecified eye: Secondary | ICD-10-CM | POA: Diagnosis not present

## 2011-06-18 DIAGNOSIS — R0602 Shortness of breath: Secondary | ICD-10-CM

## 2011-06-18 DIAGNOSIS — H353290 Exudative age-related macular degeneration, unspecified eye, stage unspecified: Secondary | ICD-10-CM

## 2011-06-18 DIAGNOSIS — H2510 Age-related nuclear cataract, unspecified eye: Secondary | ICD-10-CM

## 2011-06-18 DIAGNOSIS — N19 Unspecified kidney failure: Secondary | ICD-10-CM | POA: Diagnosis not present

## 2011-06-18 DIAGNOSIS — R9389 Abnormal findings on diagnostic imaging of other specified body structures: Secondary | ICD-10-CM | POA: Diagnosis not present

## 2011-06-18 DIAGNOSIS — H3530 Unspecified macular degeneration: Secondary | ICD-10-CM

## 2011-07-28 ENCOUNTER — Encounter (INDEPENDENT_AMBULATORY_CARE_PROVIDER_SITE_OTHER): Payer: Medicare Other | Admitting: Ophthalmology

## 2011-07-28 DIAGNOSIS — H353290 Exudative age-related macular degeneration, unspecified eye, stage unspecified: Secondary | ICD-10-CM

## 2011-07-28 DIAGNOSIS — H2510 Age-related nuclear cataract, unspecified eye: Secondary | ICD-10-CM

## 2011-07-28 DIAGNOSIS — H3530 Unspecified macular degeneration: Secondary | ICD-10-CM | POA: Diagnosis not present

## 2011-07-28 DIAGNOSIS — H43819 Vitreous degeneration, unspecified eye: Secondary | ICD-10-CM

## 2011-07-31 DIAGNOSIS — L500 Allergic urticaria: Secondary | ICD-10-CM | POA: Diagnosis not present

## 2011-07-31 DIAGNOSIS — R109 Unspecified abdominal pain: Secondary | ICD-10-CM | POA: Diagnosis not present

## 2011-07-31 DIAGNOSIS — L501 Idiopathic urticaria: Secondary | ICD-10-CM | POA: Diagnosis not present

## 2011-08-04 DIAGNOSIS — L501 Idiopathic urticaria: Secondary | ICD-10-CM | POA: Diagnosis not present

## 2011-08-14 DIAGNOSIS — N183 Chronic kidney disease, stage 3 unspecified: Secondary | ICD-10-CM | POA: Diagnosis not present

## 2011-08-15 DIAGNOSIS — R300 Dysuria: Secondary | ICD-10-CM | POA: Diagnosis not present

## 2011-08-15 DIAGNOSIS — N3080 Other cystitis without hematuria: Secondary | ICD-10-CM | POA: Diagnosis not present

## 2011-08-15 DIAGNOSIS — R319 Hematuria, unspecified: Secondary | ICD-10-CM | POA: Diagnosis not present

## 2011-08-15 DIAGNOSIS — N183 Chronic kidney disease, stage 3 unspecified: Secondary | ICD-10-CM | POA: Diagnosis not present

## 2011-08-15 DIAGNOSIS — R10814 Left lower quadrant abdominal tenderness: Secondary | ICD-10-CM | POA: Diagnosis not present

## 2011-08-15 DIAGNOSIS — L501 Idiopathic urticaria: Secondary | ICD-10-CM | POA: Diagnosis not present

## 2011-08-19 DIAGNOSIS — D649 Anemia, unspecified: Secondary | ICD-10-CM | POA: Diagnosis not present

## 2011-08-19 DIAGNOSIS — N390 Urinary tract infection, site not specified: Secondary | ICD-10-CM | POA: Diagnosis not present

## 2011-08-19 DIAGNOSIS — N183 Chronic kidney disease, stage 3 unspecified: Secondary | ICD-10-CM | POA: Diagnosis not present

## 2011-08-19 DIAGNOSIS — I10 Essential (primary) hypertension: Secondary | ICD-10-CM | POA: Diagnosis not present

## 2011-08-19 DIAGNOSIS — I129 Hypertensive chronic kidney disease with stage 1 through stage 4 chronic kidney disease, or unspecified chronic kidney disease: Secondary | ICD-10-CM | POA: Diagnosis not present

## 2011-09-02 DIAGNOSIS — R319 Hematuria, unspecified: Secondary | ICD-10-CM | POA: Diagnosis not present

## 2011-09-08 DIAGNOSIS — N3020 Other chronic cystitis without hematuria: Secondary | ICD-10-CM | POA: Diagnosis not present

## 2011-09-08 DIAGNOSIS — N8111 Cystocele, midline: Secondary | ICD-10-CM | POA: Diagnosis not present

## 2011-09-08 DIAGNOSIS — N390 Urinary tract infection, site not specified: Secondary | ICD-10-CM | POA: Diagnosis not present

## 2011-09-17 ENCOUNTER — Encounter (INDEPENDENT_AMBULATORY_CARE_PROVIDER_SITE_OTHER): Payer: Medicare Other | Admitting: Ophthalmology

## 2011-09-17 DIAGNOSIS — H3530 Unspecified macular degeneration: Secondary | ICD-10-CM

## 2011-09-17 DIAGNOSIS — H353290 Exudative age-related macular degeneration, unspecified eye, stage unspecified: Secondary | ICD-10-CM | POA: Diagnosis not present

## 2011-09-17 DIAGNOSIS — H2510 Age-related nuclear cataract, unspecified eye: Secondary | ICD-10-CM

## 2011-09-17 DIAGNOSIS — H43819 Vitreous degeneration, unspecified eye: Secondary | ICD-10-CM

## 2011-09-19 DIAGNOSIS — H02059 Trichiasis without entropian unspecified eye, unspecified eyelid: Secondary | ICD-10-CM | POA: Diagnosis not present

## 2011-09-19 DIAGNOSIS — H1045 Other chronic allergic conjunctivitis: Secondary | ICD-10-CM | POA: Diagnosis not present

## 2011-10-13 DIAGNOSIS — N3020 Other chronic cystitis without hematuria: Secondary | ICD-10-CM | POA: Diagnosis not present

## 2011-10-13 DIAGNOSIS — N8111 Cystocele, midline: Secondary | ICD-10-CM | POA: Diagnosis not present

## 2011-11-05 ENCOUNTER — Encounter (INDEPENDENT_AMBULATORY_CARE_PROVIDER_SITE_OTHER): Payer: Medicare Other | Admitting: Ophthalmology

## 2011-11-05 DIAGNOSIS — H353290 Exudative age-related macular degeneration, unspecified eye, stage unspecified: Secondary | ICD-10-CM

## 2011-11-05 DIAGNOSIS — I10 Essential (primary) hypertension: Secondary | ICD-10-CM

## 2011-11-05 DIAGNOSIS — H43819 Vitreous degeneration, unspecified eye: Secondary | ICD-10-CM | POA: Diagnosis not present

## 2011-11-05 DIAGNOSIS — H3530 Unspecified macular degeneration: Secondary | ICD-10-CM | POA: Diagnosis not present

## 2011-11-05 DIAGNOSIS — H35039 Hypertensive retinopathy, unspecified eye: Secondary | ICD-10-CM

## 2011-11-13 DIAGNOSIS — R7309 Other abnormal glucose: Secondary | ICD-10-CM | POA: Diagnosis not present

## 2011-11-13 DIAGNOSIS — J84112 Idiopathic pulmonary fibrosis: Secondary | ICD-10-CM | POA: Diagnosis not present

## 2011-11-13 DIAGNOSIS — Z79899 Other long term (current) drug therapy: Secondary | ICD-10-CM | POA: Diagnosis not present

## 2011-11-13 DIAGNOSIS — E782 Mixed hyperlipidemia: Secondary | ICD-10-CM | POA: Diagnosis not present

## 2011-11-13 DIAGNOSIS — R252 Cramp and spasm: Secondary | ICD-10-CM | POA: Diagnosis not present

## 2011-11-19 DIAGNOSIS — H02059 Trichiasis without entropian unspecified eye, unspecified eyelid: Secondary | ICD-10-CM | POA: Diagnosis not present

## 2011-11-24 DIAGNOSIS — N3020 Other chronic cystitis without hematuria: Secondary | ICD-10-CM | POA: Diagnosis not present

## 2011-12-15 ENCOUNTER — Encounter: Payer: Self-pay | Admitting: Emergency Medicine

## 2011-12-16 ENCOUNTER — Encounter: Payer: Self-pay | Admitting: Emergency Medicine

## 2011-12-16 ENCOUNTER — Ambulatory Visit (INDEPENDENT_AMBULATORY_CARE_PROVIDER_SITE_OTHER): Payer: Medicare Other | Admitting: Emergency Medicine

## 2011-12-16 VITALS — BP 180/98 | HR 75 | Temp 98.2°F | Ht 68.0 in | Wt 235.6 lb

## 2011-12-16 DIAGNOSIS — R0602 Shortness of breath: Secondary | ICD-10-CM

## 2011-12-16 DIAGNOSIS — J8410 Pulmonary fibrosis, unspecified: Secondary | ICD-10-CM | POA: Diagnosis not present

## 2011-12-16 NOTE — Assessment & Plan Note (Signed)
?   Related to wt gain vs progressive ILD or other changes - screening PFT to compare with priors

## 2011-12-16 NOTE — Progress Notes (Signed)
Subjective:    Patient ID: Lorraine Leonard, female    DOB: October 14, 1940, 71 y.o.   MRN: [id number]  HPI 71yo woman, never smoker, hx HTN, allergies, hyperlipidemia, ? Asthma. Seen here by Dr MR for cough, found to have some LLL interstitial changes and nodular changes on CT scan 2008. She reports more SOB than her last visit, has gained about 35 pounds. She has occasional cough and a tickle in her throat. Freq throat clearing.    Review of Systems  Constitutional: Positive for activity change and unexpected weight change. Negative for fever, chills, diaphoresis, appetite change and fatigue.  HENT: Positive for congestion, rhinorrhea, sneezing and postnasal drip. Negative for hearing loss, ear pain, nosebleeds, sore throat, facial swelling, trouble swallowing, neck pain, neck stiffness, voice change, sinus pressure, tinnitus and ear discharge.   Eyes: Negative for visual disturbance.  Respiratory: Positive for cough and shortness of breath. Negative for choking, chest tightness and wheezing.   Cardiovascular: Negative for chest pain, palpitations and leg swelling.  Gastrointestinal: Positive for constipation. Negative for nausea, vomiting, abdominal pain and blood in stool.  Genitourinary: Negative for difficulty urinating.  Musculoskeletal: Positive for arthralgias. Negative for myalgias, back pain, joint swelling and gait problem.  Skin: Positive for rash.  Neurological: Positive for headaches. Negative for dizziness, tremors, seizures, syncope, speech difficulty, weakness, light-headedness and numbness.  Hematological: Negative for adenopathy. Does not bruise/bleed easily.  Psychiatric/Behavioral: Negative for confusion, disturbed wake/sleep cycle and agitation. The patient is nervous/anxious.        Objective:   Physical Exam Filed Vitals:   12/16/11 1555  BP: 180/98  Pulse: 75  Temp: 98.2 F (36.8 C)    Gen: Pleasant, obese woman, in no distress,  normal affect  ENT: No lesions,   mouth clear,  oropharynx clear, no postnasal drip  Neck: No JVD, no TMG, no carotid bruits  Lungs: No use of accessory muscles, few insp crackles L base  Cardiovascular: RRR, heart sounds normal, no murmur or gallops, no peripheral edema  Musculoskeletal: No deformities, no cyanosis or clubbing  Neuro: alert, non focal  Skin: Warm, no lesions or rashes     CHEST CT WITHOUT CONTRAST - 01/12/07:  Technique: Multidetector CT imaging of the chest was performed following the standard protocol without IV contrast.  Comparison: The dictation from the [facility] CT chest without contrast of 12/22/06 was reviewed.  Findings: The ground glass opacity described within the medial right lower lobe is no longer seen. The small nodule in the left upper lobe near the fissure may be partially calcified, and there is a calcified granuloma within the right lower lobe on image #40. The other nodules appear stable and no new lung nodule is seen. I would favor the small nodules as being due to prior granulomatous disease but follow-up in 9 to 12 months is recommended. No pleural effusion is seen. There is a slightly more prominent nodule or pleural plaque abutting the right hemidiaphragm at the right lung base on image #38 and direct comparison with the prior CT may be helpful. No mediastinal or hilar adenopathy is seen. The liver is somewhat low in attenuation suggesting fatty infiltration.  IMPRESSION:  1. The ground-glass opacity noted on the outside CT of 12/22/06 is no longer seen.  2. No change in small nodules, right more numerous than left, a few of which are calcified , suggesting prior granulomatous disease. Consider follow-up CT in 9-12 months.  3. No mediastinal or hilar adenopathy.  Assessment & Plan:

## 2011-12-16 NOTE — Patient Instructions (Addendum)
We will repeat your CT scan of the chest We will repeat your pulmonary function testing Follow with Dr Delton Coombes next available appointment after your testing

## 2011-12-16 NOTE — Assessment & Plan Note (Signed)
Hx LLL scarring CT scan from 2008, ? Progression - repeat Ct scan chest now - follow to review

## 2011-12-19 ENCOUNTER — Ambulatory Visit (INDEPENDENT_AMBULATORY_CARE_PROVIDER_SITE_OTHER)
Admission: RE | Admit: 2011-12-19 | Discharge: 2011-12-19 | Disposition: A | Discharge status: Home / Self Care | Payer: Medicare Other | Source: Ambulatory Visit | Attending: Emergency Medicine | Admitting: Emergency Medicine

## 2011-12-19 DIAGNOSIS — R0602 Shortness of breath: Secondary | ICD-10-CM

## 2011-12-19 DIAGNOSIS — J8410 Pulmonary fibrosis, unspecified: Secondary | ICD-10-CM | POA: Diagnosis not present

## 2011-12-24 ENCOUNTER — Encounter (INDEPENDENT_AMBULATORY_CARE_PROVIDER_SITE_OTHER): Payer: Medicare Other | Admitting: Ophthalmology

## 2011-12-24 DIAGNOSIS — H3530 Unspecified macular degeneration: Secondary | ICD-10-CM

## 2011-12-24 DIAGNOSIS — H353290 Exudative age-related macular degeneration, unspecified eye, stage unspecified: Secondary | ICD-10-CM

## 2011-12-24 DIAGNOSIS — H35039 Hypertensive retinopathy, unspecified eye: Secondary | ICD-10-CM

## 2011-12-24 DIAGNOSIS — I10 Essential (primary) hypertension: Secondary | ICD-10-CM | POA: Diagnosis not present

## 2011-12-24 DIAGNOSIS — H2510 Age-related nuclear cataract, unspecified eye: Secondary | ICD-10-CM

## 2011-12-24 DIAGNOSIS — H43819 Vitreous degeneration, unspecified eye: Secondary | ICD-10-CM

## 2011-12-25 DIAGNOSIS — E782 Mixed hyperlipidemia: Secondary | ICD-10-CM | POA: Diagnosis not present

## 2011-12-25 DIAGNOSIS — Z79899 Other long term (current) drug therapy: Secondary | ICD-10-CM | POA: Diagnosis not present

## 2011-12-25 DIAGNOSIS — Z23 Encounter for immunization: Secondary | ICD-10-CM | POA: Diagnosis not present

## 2011-12-25 DIAGNOSIS — E785 Hyperlipidemia, unspecified: Secondary | ICD-10-CM | POA: Diagnosis not present

## 2011-12-29 ENCOUNTER — Telehealth: Payer: Self-pay | Admitting: Emergency Medicine

## 2011-12-29 NOTE — Telephone Encounter (Signed)
Pt is requesting chest ct results. Pls advise.

## 2012-01-02 NOTE — Telephone Encounter (Signed)
Reviewed results w patient - CT scan with stable ILD

## 2012-02-03 ENCOUNTER — Ambulatory Visit (INDEPENDENT_AMBULATORY_CARE_PROVIDER_SITE_OTHER): Payer: Medicare Other | Admitting: Emergency Medicine

## 2012-02-03 ENCOUNTER — Encounter: Payer: Self-pay | Admitting: Emergency Medicine

## 2012-02-03 VITALS — BP 140/76 | HR 83 | Temp 98.1°F | Ht 68.0 in | Wt 233.0 lb

## 2012-02-03 DIAGNOSIS — R0602 Shortness of breath: Secondary | ICD-10-CM | POA: Diagnosis not present

## 2012-02-03 DIAGNOSIS — J8410 Pulmonary fibrosis, unspecified: Secondary | ICD-10-CM

## 2012-02-03 DIAGNOSIS — R05 Cough: Secondary | ICD-10-CM

## 2012-02-03 DIAGNOSIS — R059 Cough, unspecified: Secondary | ICD-10-CM

## 2012-02-03 LAB — PULMONARY FUNCTION TEST

## 2012-02-03 MED ORDER — AZELASTINE HCL 0.1 % NA SOLN: 2.0000 | Freq: Two times a day (BID) | NASAL | Status: DC

## 2012-02-03 NOTE — Patient Instructions (Addendum)
Your Ct scan is stable - no evidence of worsening scarring Your breathing tests do not show evidence for asthma Contineu your zyrtec and nexium Start astelin nasal spray, 2 sprays each nostril 2 -3 times a day Follow with Dr Delton Coombes in 6 months or sooner if you have any problems

## 2012-02-03 NOTE — Assessment & Plan Note (Signed)
With restriction, likely due to obesity. CT scan reassuring, no progression ILD

## 2012-02-03 NOTE — Progress Notes (Signed)
Subjective:    Patient ID: Lorraine Leonard, female    DOB: Jun 17, 1940, 70 y.o.   MRN: [id number]  HPI 71yo woman, never smoker, hx HTN, allergies, hyperlipidemia, ? Asthma. Seen here by Dr MR for cough, found to have some LLL interstitial changes and nodular changes on CT scan 2008. She reports more SOB than her last visit, has gained about 35 pounds. She has occasional cough and a tickle in her throat. Freq throat clearing.   ROV 02/03/12 -- hx cough, ? 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 Last time we repeated CT scan of the chest, ordered PFT. Returns feeling that her breathing is the same - no new sx. She is on zyrtec and nexium. Has been on nasal steroid in the past. She has lost 5 lbs.    No evidence obstruction on spirometry.    PULMONARY FUNCTON TEST 02/03/2012  FVC 2.91  FEV1 2.1  FEV1/FVC 72.2  FVC  % Predicted 89  FEV % Predicted 90  FeF 25-75 1.45  FeF 25-75 % Predicted 2.5        Objective:   Physical Exam Filed Vitals:   02/03/12 1410  BP: 140/76  Pulse: 83  Temp: 98.1 F (36.7 C)    Gen: Pleasant, obese woman, in no distress,  normal affect  ENT: No lesions,  mouth clear,  oropharynx clear, no postnasal drip  Neck: No JVD, no TMG, no carotid bruits  Lungs: No use of accessory muscles, few insp crackles L base  Cardiovascular: RRR, heart sounds normal, no murmur or gallops, no peripheral edema  Musculoskeletal: No deformities, no cyanosis or clubbing  Neuro: alert, non focal  Skin: Warm, no lesions or rashes    CT SCAN CHEST 12/19/11:  Comparison: Chest CT 01/12/2007.  Findings:  Mediastinum: Heart size is normal. There is no significant  pericardial fluid, thickening or pericardial calcification. The  pulmonic trunk is mildly dilated measuring 3.8 cm in diameter. No  pathologically enlarged mediastinal or hilar lymph nodes. Please  note that accurate exclusion of hilar adenopathy is limited on  noncontrast CT scans. Esophagus is unremarkable in  appearance.  There is atherosclerosis of the thoracic aorta, the great vessels  of the mediastinum and the coronary arteries, including calcified  atherosclerotic plaque in the left anterior descending coronary  arteries.  Lungs/Pleura: No acute consolidative airspace disease. No pleural  effusions. Calcified granuloma in the right lower lobe. 6 mm  subpleural nodule associated with the lateral aspect of the right  major fissure (image 35 of series 5). Multiple other tiny 1-2 mm  nodules in the periphery of the lungs bilaterally, are favored to  represent areas of mucoid impaction within terminal bronchioles.  No other larger more suspicious appearing pulmonary nodules or  masses are otherwise identified. In the lungs bilaterally there  are some patchy areas of very mild peripheral subpleural  reticulation, most pronounced in the left lower lobe. In the left  lower lobe there is also some bronchial wall thickening with mild  cylindrical bronchiectasis and peripheral bronchiolectasis. These  findings are very similar in comparison to prior study 01/12/2007.  Inspiratory and expiratory imaging is unremarkable.  Upper Abdomen: Mild decreased attenuation throughout the hepatic  parenchyma suggestive of very mild hepatic steatosis.  Musculoskeletal: There are no aggressive appearing lytic or blastic  lesions noted in the visualized portions of the skeleton.  IMPRESSION:  1. While there are some very mild fibrotic changes in the lungs,  most pronounced in the left  lower lobe, the pattern is highly  nonspecific and is unchanged compared to remote prior examination  01/12/2007. Overall, this is favored to represent sequela of prior  infection/inflammation, rather than evidence of underlying  interstitial lung disease.  2. Mild dilatation of the pulmonic trunk (3.8 cm in diameter).  Clinical correlation for signs and symptoms of pulmonary arterial  hypertension may be warranted.  3.  Atherosclerosis, including left anterior descending coronary  artery disease. Assessment for potential risk factor modification,  dietary therapy or pharmacologic therapy may be warranted, if  clinically indicated.  4. Mild hepatic steatosis.       Assessment & Plan:  COUGH No AFL on spiro - no evidence for asthma. Sx sound UA in nature - nexium - add astelin to zyrtec  DYSPNEA With restriction, likely due to obesity. 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

## 2012-02-03 NOTE — Assessment & Plan Note (Signed)
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

## 2012-02-03 NOTE — Progress Notes (Signed)
PFT done today. 

## 2012-02-03 NOTE — Assessment & Plan Note (Signed)
No AFL on spiro - no evidence for asthma. Sx sound UA in nature - nexium - add astelin to zyrtec

## 2012-02-11 ENCOUNTER — Encounter (INDEPENDENT_AMBULATORY_CARE_PROVIDER_SITE_OTHER): Payer: Medicare Other | Admitting: Ophthalmology

## 2012-02-11 DIAGNOSIS — I10 Essential (primary) hypertension: Secondary | ICD-10-CM

## 2012-02-11 DIAGNOSIS — H3530 Unspecified macular degeneration: Secondary | ICD-10-CM | POA: Diagnosis not present

## 2012-02-11 DIAGNOSIS — H35039 Hypertensive retinopathy, unspecified eye: Secondary | ICD-10-CM | POA: Diagnosis not present

## 2012-02-11 DIAGNOSIS — H2510 Age-related nuclear cataract, unspecified eye: Secondary | ICD-10-CM

## 2012-02-11 DIAGNOSIS — H353290 Exudative age-related macular degeneration, unspecified eye, stage unspecified: Secondary | ICD-10-CM

## 2012-02-11 DIAGNOSIS — H43819 Vitreous degeneration, unspecified eye: Secondary | ICD-10-CM

## 2012-03-25 DIAGNOSIS — K219 Gastro-esophageal reflux disease without esophagitis: Secondary | ICD-10-CM | POA: Diagnosis not present

## 2012-03-25 DIAGNOSIS — I10 Essential (primary) hypertension: Secondary | ICD-10-CM | POA: Diagnosis not present

## 2012-03-25 DIAGNOSIS — E559 Vitamin D deficiency, unspecified: Secondary | ICD-10-CM | POA: Diagnosis not present

## 2012-03-25 DIAGNOSIS — E039 Hypothyroidism, unspecified: Secondary | ICD-10-CM | POA: Diagnosis not present

## 2012-03-25 DIAGNOSIS — E785 Hyperlipidemia, unspecified: Secondary | ICD-10-CM | POA: Diagnosis not present

## 2012-04-07 ENCOUNTER — Encounter (INDEPENDENT_AMBULATORY_CARE_PROVIDER_SITE_OTHER): Payer: Medicare Other | Admitting: Ophthalmology

## 2012-04-07 DIAGNOSIS — H3530 Unspecified macular degeneration: Secondary | ICD-10-CM

## 2012-04-07 DIAGNOSIS — Z1231 Encounter for screening mammogram for malignant neoplasm of breast: Secondary | ICD-10-CM | POA: Diagnosis not present

## 2012-04-07 DIAGNOSIS — I10 Essential (primary) hypertension: Secondary | ICD-10-CM

## 2012-04-07 DIAGNOSIS — H353290 Exudative age-related macular degeneration, unspecified eye, stage unspecified: Secondary | ICD-10-CM

## 2012-04-07 DIAGNOSIS — H2510 Age-related nuclear cataract, unspecified eye: Secondary | ICD-10-CM

## 2012-04-07 DIAGNOSIS — H35039 Hypertensive retinopathy, unspecified eye: Secondary | ICD-10-CM | POA: Diagnosis not present

## 2012-04-29 DIAGNOSIS — H02059 Trichiasis without entropian unspecified eye, unspecified eyelid: Secondary | ICD-10-CM | POA: Diagnosis not present

## 2012-05-27 DIAGNOSIS — N3020 Other chronic cystitis without hematuria: Secondary | ICD-10-CM | POA: Diagnosis not present

## 2012-05-27 DIAGNOSIS — R351 Nocturia: Secondary | ICD-10-CM | POA: Diagnosis not present

## 2012-06-16 ENCOUNTER — Encounter (INDEPENDENT_AMBULATORY_CARE_PROVIDER_SITE_OTHER): Payer: Medicare Other | Admitting: Ophthalmology

## 2012-06-16 DIAGNOSIS — H43819 Vitreous degeneration, unspecified eye: Secondary | ICD-10-CM

## 2012-06-16 DIAGNOSIS — I10 Essential (primary) hypertension: Secondary | ICD-10-CM

## 2012-06-16 DIAGNOSIS — H3530 Unspecified macular degeneration: Secondary | ICD-10-CM | POA: Diagnosis not present

## 2012-06-16 DIAGNOSIS — H353290 Exudative age-related macular degeneration, unspecified eye, stage unspecified: Secondary | ICD-10-CM

## 2012-06-16 DIAGNOSIS — H35039 Hypertensive retinopathy, unspecified eye: Secondary | ICD-10-CM

## 2012-06-16 DIAGNOSIS — H2510 Age-related nuclear cataract, unspecified eye: Secondary | ICD-10-CM

## 2012-06-22 DIAGNOSIS — H02059 Trichiasis without entropian unspecified eye, unspecified eyelid: Secondary | ICD-10-CM | POA: Diagnosis not present

## 2012-06-29 ENCOUNTER — Other Ambulatory Visit: Payer: Self-pay | Admitting: *Deleted

## 2012-06-29 DIAGNOSIS — M899 Disorder of bone, unspecified: Secondary | ICD-10-CM

## 2012-06-30 DIAGNOSIS — E785 Hyperlipidemia, unspecified: Secondary | ICD-10-CM | POA: Diagnosis not present

## 2012-06-30 DIAGNOSIS — E559 Vitamin D deficiency, unspecified: Secondary | ICD-10-CM | POA: Diagnosis not present

## 2012-06-30 DIAGNOSIS — E119 Type 2 diabetes mellitus without complications: Secondary | ICD-10-CM | POA: Diagnosis not present

## 2012-06-30 DIAGNOSIS — I10 Essential (primary) hypertension: Secondary | ICD-10-CM | POA: Diagnosis not present

## 2012-06-30 DIAGNOSIS — E039 Hypothyroidism, unspecified: Secondary | ICD-10-CM | POA: Diagnosis not present

## 2012-07-01 DIAGNOSIS — Z1212 Encounter for screening for malignant neoplasm of rectum: Secondary | ICD-10-CM | POA: Diagnosis not present

## 2012-07-07 ENCOUNTER — Other Ambulatory Visit: Payer: Self-pay | Admitting: *Deleted

## 2012-07-07 MED ORDER — TRAMADOL HCL 50 MG PO TABS: 50.0000 mg | ORAL_TABLET | Freq: Three times a day (TID) | ORAL | Status: DC | PRN

## 2012-07-07 MED ORDER — LEVOTHYROXINE SODIUM 75 MCG PO TABS: 75.0000 ug | ORAL_TABLET | Freq: Every day | ORAL | Status: DC

## 2012-07-21 ENCOUNTER — Ambulatory Visit (INDEPENDENT_AMBULATORY_CARE_PROVIDER_SITE_OTHER): Payer: Medicare Other

## 2012-07-21 ENCOUNTER — Ambulatory Visit (INDEPENDENT_AMBULATORY_CARE_PROVIDER_SITE_OTHER): Payer: Medicare Other | Admitting: Pharmacist

## 2012-07-21 ENCOUNTER — Other Ambulatory Visit: Payer: Self-pay

## 2012-07-21 VITALS — Ht 68.0 in | Wt 224.0 lb

## 2012-07-21 DIAGNOSIS — Z09 Encounter for follow-up examination after completed treatment for conditions other than malignant neoplasm: Secondary | ICD-10-CM

## 2012-07-21 DIAGNOSIS — M899 Disorder of bone, unspecified: Secondary | ICD-10-CM

## 2012-07-21 DIAGNOSIS — M949 Disorder of cartilage, unspecified: Secondary | ICD-10-CM

## 2012-07-21 DIAGNOSIS — R110 Nausea: Secondary | ICD-10-CM

## 2012-07-21 DIAGNOSIS — Z13820 Encounter for screening for osteoporosis: Secondary | ICD-10-CM | POA: Diagnosis not present

## 2012-07-21 MED ORDER — ONDANSETRON HCL 8 MG PO TABS: 8.0000 mg | ORAL_TABLET | Freq: Three times a day (TID) | ORAL | Status: DC | PRN

## 2012-07-21 NOTE — Patient Instructions (Addendum)

## 2012-07-21 NOTE — Progress Notes (Signed)
Patient ID: Lorraine Leonard, female   DOB: Jul 14, 1940, 72 y.o.   MRN: [id number] [facility] Current Height: Height: 5\' 8"  (172.7 cm)      Max Lifetime Height:  5'8" Current Weight: Weight: 224 lb (101.606 kg)       Ethnicity: Caucasian        HPI: Does pt already have a diagnosis of:  Osteopenia?  No Osteoporosis?  No  Back Pain?  Yes   -occassionally with increased activity    Kyphosis?  No Prior fracture?  Yes  - foot and thumb Med(s) for Osteoporosis/Osteopenia:  Calcium + D and MVI Med(s) previously tried for Osteoporosis/Osteopenia:  N/A                                                             PMH: Age at menopause:  72yo (surgiacal) Hysterectomy?  Yes Oophorectomy?  Yes HRT? Yes - Current.  Type/duration: premarin cream Steroid Use?  No Thyroid med?  Yes History of cancer?  No History of digestive disorders (ie Crohn's)?  Yes - GERD taking Nexium Current or previous eating disorders?  No Last Vitamin D Result:  28 (06-30-2012) Last Scr:  0.99 (06-30-2012)   FH/SH: Family history of osteoporosis?  No Parent with history of hip fracture?  No Family history of breast cancer?  Yes - paternal GM Exercise?  No Caffeine?  No Smoking?  No Alcohol?  No    Calcium Assessment Calcium Intake  # of servings/day  Calcium mg  Milk (8 oz) 0  x  300  = 0  Yogurt (8 oz) 0 x  400 = 0  Cheese (1 oz) 1 x  200 = 200mg   Non dairy sources   250 mg  Ca supplement 600mg  qd and mvi = 1000mg    Estimated calcium intake per day 1450mg     DEXA Results Date of Test T-Score for AP Spine L1-L4 T-Score for Left Neck of Hip T-Score for Right Neck of Hip  07/21/2012 0.4 0.0 -0.1                   Assessment: Normal BMD  Vitamin D deficiency  Recommendations: Continue current calcium intake Continue vitamin D 2000iu qd - due to recheck Vit D level 09/2012 Weight bearing exercise Educate on fall prevention - counseling and educational materials provided Recheck DEXA:  2-3  years   Time spent counseling patient:  15 minutes

## 2012-08-11 ENCOUNTER — Ambulatory Visit (INDEPENDENT_AMBULATORY_CARE_PROVIDER_SITE_OTHER): Payer: Medicare Other | Admitting: Emergency Medicine

## 2012-08-11 ENCOUNTER — Encounter: Payer: Self-pay | Admitting: Emergency Medicine

## 2012-08-11 VITALS — BP 110/68 | HR 68 | Temp 98.7°F | Ht 68.0 in | Wt 229.2 lb

## 2012-08-11 DIAGNOSIS — J8410 Pulmonary fibrosis, unspecified: Secondary | ICD-10-CM

## 2012-08-11 DIAGNOSIS — R059 Cough, unspecified: Secondary | ICD-10-CM | POA: Diagnosis not present

## 2012-08-11 DIAGNOSIS — R05 Cough: Secondary | ICD-10-CM | POA: Diagnosis not present

## 2012-08-11 NOTE — Patient Instructions (Addendum)
Please take your Zyrtec every day during the allergy season.  Otherwise continue your medications as you are taking them Follow with Dr Delton Coombes in 12 months or sooner if you have any problems CXR at your follow up visit

## 2012-08-11 NOTE — Progress Notes (Signed)
Subjective:    Patient ID: Lorraine Leonard, female    DOB: 24-Nov-1940, 72 y.o.   MRN: [id number]  HPI 72 yo woman, never smoker, hx HTN, allergies, hyperlipidemia, ? Asthma. Seen here by Dr MR for cough, found to have some LLL interstitial changes and nodular changes on CT scan 2008. She reports more SOB than her last visit, has gained about 35 pounds. She has occasional cough and a tickle in her throat. Freq throat clearing.   ROV 02/03/12 -- hx cough, ? 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 Last time we repeated CT scan of the chest, ordered PFT. Returns feeling that her breathing is the same - no new sx. She is on zyrtec and nexium. Has been on nasal steroid in the past. She has lost 5 lbs.   ROV 08/11/12 -- hx cough, ? 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 Returns for f/u >> states that addition of astelin to zyrtec has been helpful - less PND and cough.    No evidence obstruction on spirometry.  PULMONARY FUNCTON TEST 02/03/2012  FVC 2.91  FEV1 2.1  FEV1/FVC 72.2  FVC  % Predicted 89  FEV % Predicted 90  FeF 25-75 1.45  FeF 25-75 % Predicted 2.5       Objective:   Physical Exam Filed Vitals:   08/11/12 1346  BP: 110/68  Pulse: 68  Temp: 98.7 F (37.1 C)    Gen: Pleasant, obese woman, in no distress,  normal affect  ENT: No lesions,  mouth clear,  oropharynx clear, no postnasal drip  Neck: No JVD, no TMG, no carotid bruits  Lungs: No use of accessory muscles, few insp crackles L base  Cardiovascular: RRR, heart sounds normal, no murmur or gallops, no peripheral edema  Musculoskeletal: No deformities, no cyanosis or clubbing  Neuro: alert, non focal  Skin: Warm, no lesions or rashes    CT SCAN CHEST 12/19/11:  Comparison: Chest CT 01/12/2007.  Findings:  Mediastinum: Heart size is normal. There is no significant  pericardial fluid, thickening or pericardial calcification. The  pulmonic trunk is mildly dilated  measuring 3.8 cm in diameter. No  pathologically enlarged mediastinal or hilar lymph nodes. Please  note that accurate exclusion of hilar adenopathy is limited on  noncontrast CT scans. Esophagus is unremarkable in appearance.  There is atherosclerosis of the thoracic aorta, the great vessels  of the mediastinum and the coronary arteries, including calcified  atherosclerotic plaque in the left anterior descending coronary  arteries.  Lungs/Pleura: No acute consolidative airspace disease. No pleural  effusions. Calcified granuloma in the right lower lobe. 6 mm  subpleural nodule associated with the lateral aspect of the right  major fissure (image 35 of series 5). Multiple other tiny 1-2 mm  nodules in the periphery of the lungs bilaterally, are favored to  represent areas of mucoid impaction within terminal bronchioles.  No other larger more suspicious appearing pulmonary nodules or  masses are otherwise identified. In the lungs bilaterally there  are some patchy areas of very mild peripheral subpleural  reticulation, most pronounced in the left lower lobe. In the left  lower lobe there is also some bronchial wall thickening with mild  cylindrical bronchiectasis and peripheral bronchiolectasis. These  findings are very similar in comparison to prior study 01/12/2007.  Inspiratory and expiratory imaging is unremarkable.  Upper Abdomen: Mild decreased attenuation throughout the hepatic  parenchyma suggestive of very mild hepatic  steatosis.  Musculoskeletal: There are no aggressive appearing lytic or blastic  lesions noted in the visualized portions of the skeleton.  IMPRESSION:  1. While there are some very mild fibrotic changes in the lungs,  most pronounced in the left lower lobe, the pattern is highly  nonspecific and is unchanged compared to remote prior examination  01/12/2007. Overall, this is favored to represent sequela of prior  infection/inflammation, rather than evidence of  underlying  interstitial lung disease.  2. Mild dilatation of the pulmonic trunk (3.8 cm in diameter).  Clinical correlation for signs and symptoms of pulmonary arterial  hypertension may be warranted.  3. Atherosclerosis, including left anterior descending coronary  artery disease. Assessment for potential risk factor modification,  dietary therapy or pharmacologic therapy may be warranted, if  clinically indicated.  4. Mild hepatic steatosis.       Assessment & Plan:  PULMONARY FIBROSIS LLL stable on last Ct scan  - CXR in 1 year or sooner if any new sx  COUGH Improved with addition of astelin. She is using zyrtec prn - increase the zyrtec to qd during Spring, then back to prn

## 2012-08-11 NOTE — Assessment & Plan Note (Signed)
Improved with addition of astelin. She is using zyrtec prn - increase the zyrtec to qd during Spring, then back to prn

## 2012-08-11 NOTE — Assessment & Plan Note (Addendum)
LLL stable on last Ct scan  - CXR in 1 year or sooner if any new sx

## 2012-08-13 ENCOUNTER — Telehealth: Payer: Self-pay | Admitting: Nurse Practitioner

## 2012-08-13 ENCOUNTER — Other Ambulatory Visit: Payer: Self-pay | Admitting: *Deleted

## 2012-08-13 MED ORDER — TRAMADOL HCL 50 MG PO TABS: 50.0000 mg | ORAL_TABLET | Freq: Three times a day (TID) | ORAL | Status: DC | PRN

## 2012-08-13 NOTE — Telephone Encounter (Signed)
rx called in

## 2012-08-13 NOTE — Telephone Encounter (Signed)
If approved please route to nurse and have her phone into pharmacy.    Last visit 06/30/12.  Follow-up due 09/30/12.  Last filled 07/07/12.

## 2012-08-14 NOTE — Telephone Encounter (Signed)
[facility] saying [facility].  [facility] [place] and left on their voicemail.

## 2012-08-16 DIAGNOSIS — H02059 Trichiasis without entropian unspecified eye, unspecified eyelid: Secondary | ICD-10-CM | POA: Diagnosis not present

## 2012-08-16 DIAGNOSIS — H00029 Hordeolum internum unspecified eye, unspecified eyelid: Secondary | ICD-10-CM | POA: Diagnosis not present

## 2012-08-16 NOTE — Telephone Encounter (Signed)
PER EPIC THIS WAS TAKEN CARE OF ON 08/14/12

## 2012-08-24 ENCOUNTER — Ambulatory Visit (INDEPENDENT_AMBULATORY_CARE_PROVIDER_SITE_OTHER): Payer: Medicare Other | Admitting: Family Medicine

## 2012-08-24 ENCOUNTER — Telehealth: Payer: Self-pay | Admitting: Nurse Practitioner

## 2012-08-24 ENCOUNTER — Encounter: Payer: Self-pay | Admitting: Family Medicine

## 2012-08-24 VITALS — BP 150/78 | HR 74 | Temp 98.2°F | Wt 229.4 lb

## 2012-08-24 DIAGNOSIS — L500 Allergic urticaria: Secondary | ICD-10-CM

## 2012-08-24 MED ORDER — RANITIDINE HCL 300 MG PO TABS: 300.0000 mg | ORAL_TABLET | Freq: Every day | ORAL | Status: DC

## 2012-08-24 MED ORDER — METHYLPREDNISOLONE ACETATE 80 MG/ML IJ SUSP
120.0000 mg | Freq: Once | INTRAMUSCULAR | Status: AC
  Administered 2012-08-24: 120 mg via INTRAMUSCULAR

## 2012-08-24 MED ORDER — PREDNISONE 20 MG PO TABS: 40.0000 mg | ORAL_TABLET | Freq: Every day | ORAL | Status: DC

## 2012-08-24 MED ORDER — DIPHENHYDRAMINE HCL 50 MG PO TABS: 50.0000 mg | ORAL_TABLET | Freq: Every evening | ORAL | Status: DC | PRN

## 2012-08-24 NOTE — Progress Notes (Signed)
Tolerated injection well  Depo medrol 80 mg given verbal order Dr Modesto Charon

## 2012-08-24 NOTE — Telephone Encounter (Signed)
APPT MADE FOR [facility]- PT HAS HAD HIVES FOR 2 WEEKS

## 2012-08-24 NOTE — Progress Notes (Signed)
Patient ID: Lorraine Leonard, female   DOB: 03/29/1941, 72 y.o.   MRN: [id number] SUBJECTIVE: HPI: Rash Patient presents for evaluation of a rash involving the forearm and trunk. Rash started 2 days ago. Lesions are pink, and raised in texture. Rash has not changed over time. Rash is pruritic. Associated symptoms: none. Patient denies: abdominal pain, arthralgia, congestion, cough, crankiness, decrease in appetite, decrease in energy level, fever, headache, irritability, myalgia, nausea, sore throat and vomiting. Patient has not had contacts with similar rash. Patient has not had new exposures (soaps, lotions, laundry detergents, foods, medications, plants, insects or animals). These are the hives that she has every few months for years especially in Spring. 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 No respiratory distress.  PMH/PSH: reviewed/updated in Epic  SH/FH: reviewed/updated in Epic  Allergies: reviewed/updated in Epic  Medications: reviewed/updated in Epic  Immunizations: reviewed/updated in Epic  ROS: As above in the HPI. All other systems are stable or negative.  OBJECTIVE: APPEARANCE:  Patient in no acute distress.The patient appeared well nourished and normally developed. Acyanotic. Waist: VITAL SIGNS:BP 150/78  Pulse 74  Temp(Src) 98.2 F (36.8 C) (Oral)  Wt 229 lb 6.4 oz (104.055 kg)  BMI 34.89 kg/m2   SKIN: warm and  Dry , tattoos and scars Large Hives in both antecubital fossae and the biceps areas.  HEAD and Neck: without JVD, Head and scalp: normal Eyes:No scleral icterus. Fundi normal, eye movements normal. Ears: Auricle normal, canal normal, Tympanic membranes normal, insufflation normal. Nose: normal Throat: normal Neck & thyroid: normal  CHEST & LUNGS: Chest wall: normal Lungs: Clear  [facility]: Reveals the PMI to be normally located. Regular rhythm, First and Second Heart sounds are normal,  absence of  murmurs, rubs or gallops. Peripheral vasculature: Radial pulses: normal  ABDOMEN:  Appearance: obese Benign,, no organomegaly, no masses, no Abdominal Aortic enlargement. No Guarding , no rebound. No Bruits. Bowel sounds: normal   EXTREMETIES: nonedematous. MUSCULOSKELETAL:  Spine: normal  NEUROLOGIC: oriented to time,place and person; nonfocal.  ASSESSMENT: Allergic urticaria - Plan: ranitidine (ZANTAC) 300 MG tablet, diphenhydrAMINE (BENADRYL) 50 MG tablet, predniSONE (DELTASONE) 20 MG tablet, methylPREDNISolone acetate (DEPO-MEDROL) injection 120 mg    PLAN: Results for orders placed in visit on 02/03/12  PULMONARY FUNCTION TEST      Result Value Range   FEV1       FVC       FEV1/FVC       TLC       DLCO       No orders of the defined types were placed in this encounter.   Meds ordered this encounter  Medications  . ranitidine (ZANTAC) 300 MG tablet    Sig: Take 1 tablet (300 mg total) by mouth at bedtime.    Dispense:  30 tablet    Refill:  2  . diphenhydrAMINE (BENADRYL) 50 MG tablet    Sig: Take 1 tablet (50 mg total) by mouth at bedtime as needed for itching.    Dispense:  30 tablet    Refill:  0  . predniSONE (DELTASONE) 20 MG tablet    Sig: Take 2 tablets (40 mg total) by mouth daily. for 3 days, then 1 tab daily for 2 days, then 1/2 tab daily for 2 days.    Dispense:  9 tablet    Refill:  0  . methylPREDNISolone acetate (DEPO-MEDROL) injection 120 mg    Sig:  Recommend re-evaluation with an Allergist. Patient declines today. Does not have anaphylaxis, so she does not carry epipen all the time.  RTC prn.  Emy Angevine P. Modesto Charon, M.D.    .

## 2012-08-24 NOTE — Patient Instructions (Signed)
Methylprednisolone Suspension for Injection What is this medicine? METHYLPREDNISOLONE (meth ill pred NISS oh lone) is a corticosteroid. It is commonly used to treat inflammation of the skin, joints, lungs, and other organs. Common conditions treated include asthma, allergies, and arthritis. It is also used for other conditions, such as blood disorders and diseases of the adrenal glands. This medicine may be used for other purposes; ask your health care provider or pharmacist if you have questions. What should I tell my health care provider before I take this medicine? They need to know if you have any of these conditions: -cataracts or glaucoma -Cushings -heart disease -high blood pressure -infection including tuberculosis -low calcium or potassium levels in the blood -recent surgery -seizures -stomach or intestinal disease, including colitis -threadworms -thyroid problems -an unusual or allergic reaction to methylprednisolone, corticosteroids, benzyl alcohol, other medicines, foods, dyes, or preservatives -pregnant or trying to get pregnant -breast-feeding How should I use this medicine? This medicine is for injection into a muscle, joint, or other tissue. It is given by a health care professional in a hospital or clinic setting. Talk to your pediatrician regarding the use of this medicine in children. While this drug may be prescribed for selected conditions, precautions do apply. Overdosage: If you think you have taken too much of this medicine contact a poison control center or emergency room at once. NOTE: This medicine is only for you. Do not share this medicine with others. What if I miss a dose? This does not apply. What may interact with this medicine? Do not take this medicine with any of the following medications: -mifepristone -radiopaque contrast agents This medicine may also interact with the following medications: -aspirin and aspirin-like  medicines -cyclosporin -ketoconazole -phenobarbital -phenytoin -rifampin -tacrolimus -troleandomycin -vaccines -warfarin This list may not describe all possible interactions. Give your health care provider a list of all the medicines, herbs, non-prescription drugs, or dietary supplements you use. Also tell them if you smoke, drink alcohol, or use illegal drugs. Some items may interact with your medicine. What should I watch for while using this medicine? Visit your doctor or health care professional for regular checks on your progress. If you are taking this medicine for a long time, carry an identification card with your name and address, the type and dose of your medicine, and your doctor's name and address. The medicine may increase your risk of getting an infection. Stay away from people who are sick. Tell your doctor or health care professional if you are around anyone with measles or chickenpox. You may need to avoid some vaccines. Talk to your health care provider for more information. If you are going to have surgery, tell your doctor or health care professional that you have taken this medicine within the last twelve months. Ask your doctor or health care professional about your diet. You may need to lower the amount of salt you eat. The medicine can increase your blood sugar. If you are a diabetic check with your doctor if you need help adjusting the dose of your diabetic medicine. What side effects may I notice from receiving this medicine? Side effects that you should report to your doctor or health care professional as soon as possible: -allergic reactions like skin rash, itching or hives, swelling of the face, lips, or tongue -bloody or tarry stools -changes in vision -eye pain or bulging eyes -fever, sore throat, sneezing, cough, or other signs of infection, wounds that will not heal -increased thirst -irregular heartbeat -muscle cramps -pain   in hips, back, ribs, arms,  shoulders, or legs -swelling of the ankles, feet, hands -trouble passing urine or change in the amount of urine -unusual bleeding or bruising -unusually weak or tired -weight gain or weight loss Side effects that usually do not require medical attention (report to your doctor or health care professional if they continue or are bothersome): -changes in emotions or moods -constipation or diarrhea -headache -irritation at site where injected -nausea, vomiting -skin problems, acne, thin and shiny skin -trouble sleeping -unusual hair growth on the face or body This list may not describe all possible side effects. Call your doctor for medical advice about side effects. You may report side effects to FDA at 1-800-FDA-1088. Where should I keep my medicine? This drug is given in a hospital or clinic and will not be stored at home. NOTE: This sheet is a summary. It may not cover all possible information. If you have questions about this medicine, talk to your doctor, pharmacist, or health care provider.  2013, [employer]. (10/27/2007 2:36:31 PM)  

## 2012-08-25 ENCOUNTER — Encounter (INDEPENDENT_AMBULATORY_CARE_PROVIDER_SITE_OTHER): Payer: Medicare Other | Admitting: Ophthalmology

## 2012-08-25 ENCOUNTER — Telehealth: Payer: Self-pay | Admitting: Family Medicine

## 2012-08-25 DIAGNOSIS — H3530 Unspecified macular degeneration: Secondary | ICD-10-CM

## 2012-08-25 DIAGNOSIS — H353290 Exudative age-related macular degeneration, unspecified eye, stage unspecified: Secondary | ICD-10-CM | POA: Diagnosis not present

## 2012-08-25 DIAGNOSIS — H35039 Hypertensive retinopathy, unspecified eye: Secondary | ICD-10-CM

## 2012-08-25 DIAGNOSIS — I10 Essential (primary) hypertension: Secondary | ICD-10-CM

## 2012-08-25 DIAGNOSIS — H43819 Vitreous degeneration, unspecified eye: Secondary | ICD-10-CM

## 2012-08-25 NOTE — Telephone Encounter (Signed)
Advised patient that the oral prednisone is to be taken also.  The injection was to boost her progress.

## 2012-08-26 NOTE — Telephone Encounter (Signed)
Pt returned call and was advised to take her rx as prescribed . Pt verbalized understanding.

## 2012-08-31 ENCOUNTER — Other Ambulatory Visit: Payer: Self-pay | Admitting: Nurse Practitioner

## 2012-09-01 NOTE — Telephone Encounter (Signed)
[facility]

## 2012-09-01 NOTE — Telephone Encounter (Signed)
Please call in valium rx 

## 2012-09-01 NOTE — Telephone Encounter (Signed)
LAST RF 07/06/12. LAST OV 06/30/12. CALL [facility].

## 2012-09-06 ENCOUNTER — Telehealth: Payer: Self-pay | Admitting: Family Medicine

## 2012-09-06 DIAGNOSIS — H00029 Hordeolum internum unspecified eye, unspecified eyelid: Secondary | ICD-10-CM | POA: Diagnosis not present

## 2012-09-06 DIAGNOSIS — H02059 Trichiasis without entropian unspecified eye, unspecified eyelid: Secondary | ICD-10-CM | POA: Diagnosis not present

## 2012-09-06 NOTE — Telephone Encounter (Signed)
Please advise 

## 2012-09-16 ENCOUNTER — Other Ambulatory Visit: Payer: Self-pay | Admitting: Nurse Practitioner

## 2012-10-06 ENCOUNTER — Other Ambulatory Visit: Payer: Self-pay | Admitting: Nurse Practitioner

## 2012-10-07 DIAGNOSIS — H02059 Trichiasis without entropian unspecified eye, unspecified eyelid: Secondary | ICD-10-CM | POA: Diagnosis not present

## 2012-10-08 NOTE — Telephone Encounter (Signed)
Please call in valuim rx with 1 refill

## 2012-10-08 NOTE — Telephone Encounter (Signed)
Med at [facility]

## 2012-10-08 NOTE — Telephone Encounter (Signed)
Last filled 08/31/12,last seen 06/30/12. [facility]

## 2012-10-11 ENCOUNTER — Telehealth: Payer: Self-pay | Admitting: Nurse Practitioner

## 2012-10-11 NOTE — Telephone Encounter (Signed)
APPT MADE

## 2012-10-12 ENCOUNTER — Encounter: Payer: Self-pay | Admitting: General Practice

## 2012-10-12 ENCOUNTER — Ambulatory Visit (INDEPENDENT_AMBULATORY_CARE_PROVIDER_SITE_OTHER): Payer: Medicare Other | Admitting: General Practice

## 2012-10-12 VITALS — BP 149/72 | HR 65 | Temp 97.1°F | Ht 68.0 in | Wt 227.0 lb

## 2012-10-12 DIAGNOSIS — F329 Major depressive disorder, single episode, unspecified: Secondary | ICD-10-CM

## 2012-10-12 DIAGNOSIS — F32A Depression, unspecified: Secondary | ICD-10-CM

## 2012-10-12 DIAGNOSIS — F411 Generalized anxiety disorder: Secondary | ICD-10-CM | POA: Diagnosis not present

## 2012-10-12 MED ORDER — ESCITALOPRAM OXALATE 10 MG PO TABS: 10.0000 mg | ORAL_TABLET | Freq: Every day | ORAL | Status: DC

## 2012-10-12 NOTE — Patient Instructions (Addendum)

## 2012-10-12 NOTE — Progress Notes (Signed)
  Subjective:    Patient ID: Lorraine Leonard, female    DOB: 1940/11/04, 72 y.o.   MRN: [id number]  HPI Presents today with complaints of being nervous, crying, and worrying about things she can't control. She reports taking valium currently, but doesn't take as directed. Denies taking once daily, she may skip a day or two. Patient reports she has been on paxil and prozac in the past and discontinue due to feeling like meds were not effective. She reports having a lot of stress. She reports having back problems and not able to perform activities as in the past. Reports having to rest her back more frequently. She denies interference with activities of daily living. She reports crying and having thoughts about her son who committed suicide five years ago. She denies seeking counseling, but reports she has family that she talks with. She reports staying at home most of the time. She denies going on outings with friends. She does report attending church with family and having dinner.  Denies having thoughts of harming self or others.     Review of Systems  Constitutional: Negative for fever and chills.  Respiratory: Negative for chest tightness and shortness of breath.   Cardiovascular: Negative for chest pain and palpitations.  Neurological: Negative for dizziness, weakness and headaches.  Psychiatric/Behavioral: Positive for sleep disturbance. Negative for suicidal ideas. The patient is nervous/anxious.        Objective:   Physical Exam  Constitutional: She is oriented to person, place, and time. She appears well-developed and well-nourished.  Cardiovascular: Normal rate, regular rhythm and normal heart sounds.   Pulmonary/Chest: Effort normal and breath sounds normal.  Neurological: She is alert and oriented to person, place, and time.  Skin: Skin is warm and dry.  Psychiatric: Her mood appears anxious.          Assessment & Plan:  1. Generalized anxiety disorder, 2. Depression -  escitalopram (LEXAPRO) 10 MG tablet; Take 1 tablet (10 mg total) by mouth daily.  Dispense: 30 tablet; Refill: 0 -discussed side effects of medication -advised patient to take valium as directed -discussed reducing stress -relaxation techinques -A list of counseling resources provided, patient denies being willing to contact or discuss feelings with a counselor -RTO if symptoms worsen or seek emergent medical attention -follow up appointment in one week for evaluation of Lexapro -Patient verbalized understanding -Coralie Keens, FNP-C

## 2012-10-15 DIAGNOSIS — H02059 Trichiasis without entropian unspecified eye, unspecified eyelid: Secondary | ICD-10-CM | POA: Diagnosis not present

## 2012-10-19 ENCOUNTER — Encounter: Payer: Self-pay | Admitting: General Practice

## 2012-10-19 ENCOUNTER — Ambulatory Visit (INDEPENDENT_AMBULATORY_CARE_PROVIDER_SITE_OTHER): Payer: Medicare Other | Admitting: General Practice

## 2012-10-19 VITALS — BP 134/67 | HR 58 | Temp 97.3°F | Ht 68.0 in | Wt 227.0 lb

## 2012-10-19 DIAGNOSIS — F32A Depression, unspecified: Secondary | ICD-10-CM

## 2012-10-19 DIAGNOSIS — F411 Generalized anxiety disorder: Secondary | ICD-10-CM

## 2012-10-19 DIAGNOSIS — F329 Major depressive disorder, single episode, unspecified: Secondary | ICD-10-CM

## 2012-10-19 MED ORDER — ESCITALOPRAM OXALATE 10 MG PO TABS: 10.0000 mg | ORAL_TABLET | Freq: Every day | ORAL | Status: DC

## 2012-10-19 NOTE — Progress Notes (Signed)
  Subjective:    Patient ID: Lorraine Leonard, female    DOB: 16-May-1940, 72 y.o.   MRN: [id number]  HPI Patient presents today for follow up of medication initiation of medication for depression and anxiety. She reports having less mood swings and decreased anxiety. She reports taking medication as directed and feels it is effective. She reports having more energy and going to sit on porch more often.     Review of Systems  Constitutional: Negative for fever and chills.  Respiratory: Negative for chest tightness and shortness of breath.   Cardiovascular: Negative for chest pain and palpitations.  Gastrointestinal: Negative for abdominal pain and blood in stool.  Genitourinary: Negative for dysuria, hematuria and difficulty urinating.  Neurological: Negative for dizziness, weakness and headaches.  Psychiatric/Behavioral: Negative for suicidal ideas and self-injury. The patient is nervous/anxious.        Decreased anxiety       Objective:   Physical Exam  Constitutional: She is oriented to person, place, and time. She appears well-developed and well-nourished.  HENT:  Head: Normocephalic and atraumatic.  Cardiovascular: Normal rate, regular rhythm and normal heart sounds.   Pulmonary/Chest: Effort normal and breath sounds normal. No respiratory distress. She exhibits no tenderness.  Neurological: She is alert and oriented to person, place, and time.  Skin: Skin is warm and dry.  Psychiatric: She has a normal mood and affect.          Assessment & Plan:  1. Depression and 2. Generalized anxiety disorder - Continue escitalopram (LEXAPRO) 10 MG tablet; Take 1 tablet (10 mg total) by mouth daily.  Dispense: 30 tablet; Refill: 0 -Continue anxiety reduction and relaxation techniques -Will follow up in one month to reassess -Patient verbalized understanding -Coralie Keens, FNP-C

## 2012-10-29 ENCOUNTER — Other Ambulatory Visit: Payer: Self-pay | Admitting: Nurse Practitioner

## 2012-10-29 NOTE — Telephone Encounter (Signed)
Last seen Lorraine Leonard 10/19/12  Last filled 09/16/12   If approved print and have nurse call patient to pick up

## 2012-11-10 DIAGNOSIS — H02869 Hypertrichosis of unspecified eye, unspecified eyelid: Secondary | ICD-10-CM | POA: Diagnosis not present

## 2012-11-10 DIAGNOSIS — H2589 Other age-related cataract: Secondary | ICD-10-CM | POA: Diagnosis not present

## 2012-11-10 DIAGNOSIS — H00029 Hordeolum internum unspecified eye, unspecified eyelid: Secondary | ICD-10-CM | POA: Diagnosis not present

## 2012-11-10 DIAGNOSIS — H353290 Exudative age-related macular degeneration, unspecified eye, stage unspecified: Secondary | ICD-10-CM | POA: Diagnosis not present

## 2012-11-19 ENCOUNTER — Ambulatory Visit: Payer: Medicare Other | Admitting: General Practice

## 2012-11-25 ENCOUNTER — Encounter (INDEPENDENT_AMBULATORY_CARE_PROVIDER_SITE_OTHER): Payer: Medicare Other | Admitting: Ophthalmology

## 2012-11-25 DIAGNOSIS — H353290 Exudative age-related macular degeneration, unspecified eye, stage unspecified: Secondary | ICD-10-CM

## 2012-11-25 DIAGNOSIS — H3530 Unspecified macular degeneration: Secondary | ICD-10-CM

## 2012-11-25 DIAGNOSIS — I10 Essential (primary) hypertension: Secondary | ICD-10-CM | POA: Diagnosis not present

## 2012-11-25 DIAGNOSIS — H35039 Hypertensive retinopathy, unspecified eye: Secondary | ICD-10-CM

## 2012-11-25 DIAGNOSIS — H43819 Vitreous degeneration, unspecified eye: Secondary | ICD-10-CM

## 2012-12-13 ENCOUNTER — Encounter: Payer: Self-pay | Admitting: Family Medicine

## 2012-12-13 ENCOUNTER — Ambulatory Visit (INDEPENDENT_AMBULATORY_CARE_PROVIDER_SITE_OTHER): Payer: Medicare Other | Admitting: Family Medicine

## 2012-12-13 VITALS — BP 135/65 | HR 68 | Temp 99.1°F | Ht 67.0 in | Wt 228.2 lb

## 2012-12-13 DIAGNOSIS — R300 Dysuria: Secondary | ICD-10-CM

## 2012-12-13 DIAGNOSIS — N390 Urinary tract infection, site not specified: Secondary | ICD-10-CM

## 2012-12-13 DIAGNOSIS — R509 Fever, unspecified: Secondary | ICD-10-CM | POA: Diagnosis not present

## 2012-12-13 DIAGNOSIS — R350 Frequency of micturition: Secondary | ICD-10-CM

## 2012-12-13 LAB — POCT CBC
Granulocyte percent: 65.1 %G (ref 37–80)
HCT, POC: 41 % (ref 37.7–47.9)
Hemoglobin: 13.8 g/dL (ref 12.2–16.2)
Lymph, poc: 4 — AB (ref 0.6–3.4)
MCH, POC: 29.9 pg (ref 27–31.2)
MCHC: 33.6 g/dL (ref 31.8–35.4)
MCV: 88.8 fL (ref 80–97)
MPV: 8.1 fL (ref 0–99.8)
POC Granulocyte: 8.7 — AB (ref 2–6.9)
POC LYMPH PERCENT: 29.9 %L (ref 10–50)
Platelet Count, POC: 278 10*3/uL (ref 142–424)
RBC: 4.6 M/uL (ref 4.04–5.48)
RDW, POC: 12.9 %
WBC: 13.4 10*3/uL — AB (ref 4.6–10.2)

## 2012-12-13 LAB — POCT UA - MICROSCOPIC ONLY
Casts, Ur, LPF, POC: NEGATIVE
Crystals, Ur, HPF, POC: NEGATIVE
Mucus, UA: NEGATIVE
Yeast, UA: NEGATIVE

## 2012-12-13 LAB — POCT URINALYSIS DIPSTICK
Bilirubin, UA: NEGATIVE
Glucose, UA: NEGATIVE
Ketones, UA: NEGATIVE
Leukocytes, UA: NEGATIVE
Nitrite, UA: NEGATIVE
Spec Grav, UA: <=1.005
Urobilinogen, UA: NEGATIVE
pH, UA: 5

## 2012-12-13 MED ORDER — SULFAMETHOXAZOLE-TMP DS 800-160 MG PO TABS: 1.0000 | ORAL_TABLET | Freq: Two times a day (BID) | ORAL | Status: DC

## 2012-12-13 MED ORDER — PHENAZOPYRIDINE HCL 200 MG PO TABS: 200.0000 mg | ORAL_TABLET | Freq: Three times a day (TID) | ORAL | Status: DC | PRN

## 2012-12-13 NOTE — Progress Notes (Signed)
Subjective:    Patient ID: Lorraine Leonard, female    DOB: [birth year], 72 y.o.   MRN: [id number]  HPI Patient comes in today with complaints of urinary tract symptoms which include frequency urgency and discomfort with voiding. She also complains of some cough and upper respiratory symptoms. Patient has not been using her estrogen cream for 2-3 months.   Review of Systems  Constitutional: Positive for fever, chills and fatigue.  HENT: Positive for ear pain (L ear), sneezing, postnasal drip and sinus pressure. Negative for sore throat.   Eyes: Negative.  Negative for pain, discharge, redness, itching and visual disturbance.  Respiratory: Positive for cough. Negative for shortness of breath and wheezing.   Gastrointestinal: Positive for nausea, abdominal pain (lower, started Saturday) and constipation. Negative for vomiting.  Genitourinary: Positive for dysuria, urgency and frequency. Negative for hematuria.  Musculoskeletal: Positive for myalgias and arthralgias (all over).  Skin: Positive for rash (hive on lower lags).  Neurological: Positive for weakness and light-headedness. Negative for dizziness, tremors, numbness and headaches.       Objective:   Physical Exam  Vitals reviewed. Constitutional: She appears well-developed and well-nourished. No distress.  Pleasant  HENT:  Head: Normocephalic and atraumatic.  Eyes: Conjunctivae are normal. Right eye exhibits no discharge. Left eye exhibits no discharge. No scleral icterus.  Neck: No thyromegaly present.  Cardiovascular: Normal rate and regular rhythm.  Exam reveals no gallop and no friction rub.   No murmur heard. At 72 per minute  Pulmonary/Chest: Effort normal and breath sounds normal. No respiratory distress. She has no wheezes. She has no rales.  Abdominal: Soft. Bowel sounds are normal. She exhibits no distension and no mass. Tenderness: suprapubic pubic and both right and left lower quadrant tenderness. There is no rebound and  no guarding.  Obesity  Musculoskeletal: Normal range of motion. She exhibits no edema.  Lymphadenopathy:    She has no cervical adenopathy.  Skin: Skin is warm and dry. No rash noted.  Psychiatric: She has a normal mood and affect. Her behavior is normal. Thought content normal.     Results for orders placed in visit on 12/13/12  POCT UA - MICROSCOPIC ONLY      Result Value Range   WBC, Ur, HPF, POC tntc     RBC, urine, microscopic 20-40     Bacteria, U Microscopic many     Mucus, UA negative     Epithelial cells, urine per micros few     Crystals, Ur, HPF, POC negative     Casts, Ur, LPF, POC negative     Yeast, UA negative    POCT URINALYSIS DIPSTICK      Result Value Range   Color, UA gold     Clarity, UA clear     Glucose, UA negative     Bilirubin, UA negative'     Ketones, UA negative     Spec Grav, UA <=1.005     Blood, UA largre     pH, UA 5.0     Protein, UA 4+     Urobilinogen, UA negative     Nitrite, UA negative     Leukocytes, UA Negative          Assessment & Plan:  1. Dysuria - POCT UA - Microscopic Only - POCT urinalysis dipstick  2. Frequency of urination  3. UTI (urinary tract infection)  4. Fever and chills -CBC  Patient Instructions  Schedule appointment with regular provider for a routine follow  up Take Tylenol for aches pains and fever Start reusing vaginal cream Drink plenty of fluids Recheck urinalysis in 10-14 days   Nyra Capes MD

## 2012-12-13 NOTE — Patient Instructions (Addendum)
Schedule appointment with regular provider for a routine follow up Take Tylenol for aches pains and fever Start reusing vaginal cream Drink plenty of fluids Recheck urinalysis in 10-14 days Hold the trimethoprim while on the Septra DS

## 2012-12-13 NOTE — Addendum Note (Signed)
Addended by: Prescott Gum on: 12/13/2012 05:45 PM   Modules accepted: Orders

## 2012-12-14 ENCOUNTER — Other Ambulatory Visit: Payer: Self-pay | Admitting: Family Medicine

## 2012-12-14 DIAGNOSIS — H02059 Trichiasis without entropian unspecified eye, unspecified eyelid: Secondary | ICD-10-CM | POA: Diagnosis not present

## 2012-12-14 NOTE — Progress Notes (Signed)
Pt notified at appt 

## 2012-12-15 LAB — URINE CULTURE

## 2012-12-16 NOTE — Telephone Encounter (Signed)
Please have patient pick up

## 2012-12-16 NOTE — Telephone Encounter (Signed)
Last seen 10/19/12, last filled 10/29/12. Rx will print, route to pool to have nurse call pt for pickup if approved

## 2012-12-17 NOTE — Telephone Encounter (Signed)
Called in.

## 2012-12-23 ENCOUNTER — Ambulatory Visit (INDEPENDENT_AMBULATORY_CARE_PROVIDER_SITE_OTHER): Payer: Medicare Other | Admitting: Family Medicine

## 2012-12-23 VITALS — BP 144/66 | HR 70 | Temp 96.9°F | Ht 67.0 in | Wt 228.2 lb

## 2012-12-23 DIAGNOSIS — R5381 Other malaise: Secondary | ICD-10-CM

## 2012-12-23 DIAGNOSIS — N390 Urinary tract infection, site not specified: Secondary | ICD-10-CM | POA: Diagnosis not present

## 2012-12-23 DIAGNOSIS — R1011 Right upper quadrant pain: Secondary | ICD-10-CM

## 2012-12-23 DIAGNOSIS — R110 Nausea: Secondary | ICD-10-CM

## 2012-12-23 DIAGNOSIS — E785 Hyperlipidemia, unspecified: Secondary | ICD-10-CM

## 2012-12-23 DIAGNOSIS — R079 Chest pain, unspecified: Secondary | ICD-10-CM

## 2012-12-23 DIAGNOSIS — F329 Major depressive disorder, single episode, unspecified: Secondary | ICD-10-CM

## 2012-12-23 DIAGNOSIS — R531 Weakness: Secondary | ICD-10-CM

## 2012-12-23 DIAGNOSIS — G8929 Other chronic pain: Secondary | ICD-10-CM

## 2012-12-23 DIAGNOSIS — L500 Allergic urticaria: Secondary | ICD-10-CM

## 2012-12-23 DIAGNOSIS — M549 Dorsalgia, unspecified: Secondary | ICD-10-CM

## 2012-12-23 DIAGNOSIS — F32A Depression, unspecified: Secondary | ICD-10-CM

## 2012-12-23 DIAGNOSIS — K219 Gastro-esophageal reflux disease without esophagitis: Secondary | ICD-10-CM

## 2012-12-23 LAB — POCT URINALYSIS DIPSTICK
Bilirubin, UA: NEGATIVE
Blood, UA: NEGATIVE
Glucose, UA: NEGATIVE
Ketones, UA: NEGATIVE
Nitrite, UA: NEGATIVE
Protein, UA: NEGATIVE
Spec Grav, UA: <=1.005
Urobilinogen, UA: NEGATIVE
pH, UA: 8

## 2012-12-23 LAB — POCT UA - MICROSCOPIC ONLY
Bacteria, U Microscopic: NEGATIVE
Casts, Ur, LPF, POC: NEGATIVE
Crystals, Ur, HPF, POC: NEGATIVE
Mucus, UA: NEGATIVE
RBC, urine, microscopic: NEGATIVE
Yeast, UA: NEGATIVE

## 2012-12-23 LAB — POCT CBC
Granulocyte percent: 64 %G (ref 37–80)
HCT, POC: 39.6 % (ref 37.7–47.9)
Hemoglobin: 13.2 g/dL (ref 12.2–16.2)
Lymph, poc: 3.5 — AB (ref 0.6–3.4)
MCH, POC: 30 pg (ref 27–31.2)
MCHC: 33.4 g/dL (ref 31.8–35.4)
MCV: 90 fL (ref 80–97)
MPV: 7.9 fL (ref 0–99.8)
POC Granulocyte: 7.2 — AB (ref 2–6.9)
POC LYMPH PERCENT: 31.1 %L (ref 10–50)
Platelet Count, POC: 287 10*3/uL (ref 142–424)
RBC: 4.4 M/uL (ref 4.04–5.48)
RDW, POC: 13.3 %
WBC: 11.2 10*3/uL — AB (ref 4.6–10.2)

## 2012-12-23 MED ORDER — DIAZEPAM 5 MG PO TABS: 5.0000 mg | ORAL_TABLET | Freq: Four times a day (QID) | ORAL | Status: DC | PRN

## 2012-12-23 MED ORDER — ESCITALOPRAM OXALATE 10 MG PO TABS: 10.0000 mg | ORAL_TABLET | Freq: Every day | ORAL | Status: DC

## 2012-12-23 MED ORDER — SERTRALINE HCL 50 MG PO TABS: 50.0000 mg | ORAL_TABLET | Freq: Every day | ORAL | Status: DC

## 2012-12-23 MED ORDER — ESOMEPRAZOLE MAGNESIUM 40 MG PO CPDR: 40.0000 mg | DELAYED_RELEASE_CAPSULE | Freq: Every day | ORAL | Status: DC

## 2012-12-23 MED ORDER — ONDANSETRON HCL 8 MG PO TABS: 8.0000 mg | ORAL_TABLET | Freq: Three times a day (TID) | ORAL | Status: DC | PRN

## 2012-12-23 MED ORDER — TRAMADOL HCL 50 MG PO TABS: 50.0000 mg | ORAL_TABLET | Freq: Four times a day (QID) | ORAL | Status: DC | PRN

## 2012-12-23 MED ORDER — SIMVASTATIN 40 MG PO TABS: 40.0000 mg | ORAL_TABLET | Freq: Every evening | ORAL | Status: DC

## 2012-12-23 NOTE — Patient Instructions (Signed)
Chest Pain (Nonspecific) °It is often hard to give a specific diagnosis for the cause of chest pain. There is always a chance that your pain could be related to something serious, such as a heart attack or a blood clot in the lungs. You need to follow up with your caregiver for further evaluation. °CAUSES  °· Heartburn. °· Pneumonia or bronchitis. °· Anxiety or stress. °· Inflammation around your heart (pericarditis) or lung (pleuritis or pleurisy). °· A blood clot in the lung. °· A collapsed lung (pneumothorax). It can develop suddenly on its own (spontaneous pneumothorax) or from injury (trauma) to the chest. °· Shingles infection (herpes zoster virus). °The chest wall is composed of bones, muscles, and cartilage. Any of these can be the source of the pain. °· The bones can be bruised by injury. °· The muscles or cartilage can be strained by coughing or overwork. °· The cartilage can be affected by inflammation and become sore (costochondritis). °DIAGNOSIS  °Lab tests or other studies, such as X-rays, electrocardiography, stress testing, or cardiac imaging, may be needed to find the cause of your pain.  °TREATMENT  °· Treatment depends on what may be causing your chest pain. Treatment may include: °· Acid blockers for heartburn. °· Anti-inflammatory medicine. °· Pain medicine for inflammatory conditions. °· Antibiotics if an infection is present. °· You may be advised to change lifestyle habits. This includes stopping smoking and avoiding alcohol, caffeine, and chocolate. °· You may be advised to keep your head raised (elevated) when sleeping. This reduces the chance of acid going backward from your stomach into your esophagus. °· Most of the time, nonspecific chest pain will improve within 2 to 3 days with rest and mild pain medicine. °HOME CARE INSTRUCTIONS  °· If antibiotics were prescribed, take your antibiotics as directed. Finish them even if you start to feel better. °· For the next few days, avoid physical  activities that bring on chest pain. Continue physical activities as directed. °· Do not smoke. °· Avoid drinking alcohol. °· Only take over-the-counter or prescription medicine for pain, discomfort, or fever as directed by your caregiver. °· Follow your caregiver's suggestions for further testing if your chest pain does not go away. °· Keep any follow-up appointments you made. If you do not go to an appointment, you could develop lasting (chronic) problems with pain. If there is any problem keeping an appointment, you must call to reschedule. °SEEK MEDICAL CARE IF:  °· You think you are having problems from the medicine you are taking. Read your medicine instructions carefully. °· Your chest pain does not go away, even after treatment. °· You develop a rash with blisters on your chest. °SEEK IMMEDIATE MEDICAL CARE IF:  °· You have increased chest pain or pain that spreads to your arm, neck, jaw, back, or abdomen. °· You develop shortness of breath, an increasing cough, or you are coughing up blood. °· You have severe back or abdominal pain, feel nauseous, or vomit. °· You develop severe weakness, fainting, or chills. °· You have a fever. °THIS IS AN EMERGENCY. Do not wait to see if the pain will go away. Get medical help at once. Call your local emergency services (911 in [place] Do not drive yourself to the hospital. °MAKE SURE YOU:  °· Understand these instructions. °· Will watch your condition. °· Will get help right away if you are not doing well or get worse. °Document Released: 01/15/2005 Document Revised: 06/30/2011 Document Reviewed: 11/11/2007 °ExitCare® Patient Information ©2014 [employer]   [employer]. ° °

## 2012-12-23 NOTE — Progress Notes (Signed)
Subjective:    Patient ID: Lorraine Leonard, female    DOB: [birth year], 72 y.o.   MRN: [id number]  HPI  This 72 y.o. female presents for evaluation of fatigue, diaphoresis, nausea, and feeling weak. She does admit to having abdominal pain with eating fatty meal and has been unable to eat  Fatty meals.  She c/o ruq abdominal discomfort.  She c/o SOB, diaphoresis, and chest pain with Walking and feels like she cannot get around as good as she used to. She has hx of pulmonary Fibrosis and does see pulmonary.  She is anxious and states she is unable to take lexapro. She does take valium and needs refill.  She c/o worsening GERD.  She has hx of hypertension.  Review of Systems C/o nausea, abdominal pain, chest pain, SOB, diaphoresis.   No HA, dizziness, vision change, diarrhea, constipation, dysuria, urinary urgency or frequency, myalgias, arthralgias or rash.  Objective:   Physical Exam Vital signs noted  Well developed well nourished anxious female.  HEENT - Head atraumatic Normocephalic                Eyes - PERRLA, Conjuctiva - clear Sclera- Clear EOMI                Ears - EAC's Wnl TM's Wnl Gross Hearing WNL                Nose - Nares patent                 Throat - oropharanx wnl Respiratory - Lungs CTA bilateral Cardiac - RRR S1 and S2 without murmur GI - Abdomen tender right upper quadrant and positive murphy's and bowel sounds active x 4 Extremities - No edema. Neuro - Grossly intact.  EKG - NSR with flipped ST segments in lead I, AVL, V2.     Results for orders placed in visit on 12/23/12  POCT UA - MICROSCOPIC ONLY      Result Value Range   WBC, Ur, HPF, POC 1-5     RBC, urine, microscopic neg     Bacteria, U Microscopic neg     Mucus, UA neg     Epithelial cells, urine per micros mod     Crystals, Ur, HPF, POC neg     Casts, Ur, LPF, POC neg     Yeast, UA neg    POCT URINALYSIS DIPSTICK      Result Value Range   Color, UA yellow     Clarity, UA clear     Glucose, UA neg     Bilirubin, UA neg     Ketones, UA neg     Spec Grav, UA <=1.005     Blood, UA neg     pH, UA 8.0     Protein, UA neg     Urobilinogen, UA negative     Nitrite, UA neg     Leukocytes, UA Trace     Assessment & Plan:  UTI (urinary tract infection) - Plan: POCT UA - Microscopic Only, POCT urinalysis dipstick, POCT CBC, CMP14+EGFR, Thyroid Panel With TSH. UA cx ordered.  Nausea alone - Plan: ondansetron (ZOFRAN) 8 MG tablet  Weakness - Plan: POCT CBC, CMP14+EGFR, Thyroid Panel With TSH  Chest pain - Plan: EKG 12-Lead, Ambulatory referral to Cardiology.  She is not Currently having pain in clinic and recommend she go to ED if develops anymore chest discomfort. Follow up in 2 weeks.  Abdominal pain, chronic, right upper quadrant -  Plan: US Abdomen Limited RUQ.  Back pain - Plan: traMADol (ULTRAM) 50 MG tablet  Other and unspecified hyperlipidemia - Plan: simvastatin (ZOCOR) 40 MG tablet  GERD (gastroesophageal reflux disease) - Plan: esomeprazole (NEXIUM) 40 MG capsule  Depression - Plan: diazepam (VALIUM) 5 MG tablet, sertraline (ZOLOFT) 50 MG tablet, DISCONTINUED: escitalopram (LEXAPRO) 10 MG tablet, DISCONTINUED: diazepam (VALIUM) 5 MG tablet  Follow up in 2 weeks.

## 2012-12-24 ENCOUNTER — Other Ambulatory Visit: Payer: Self-pay | Admitting: Nurse Practitioner

## 2012-12-24 LAB — CMP14+EGFR
ALT: 34 IU/L — ABNORMAL HIGH (ref 0–32)
AST: 47 IU/L — ABNORMAL HIGH (ref 0–40)
Albumin/Globulin Ratio: 1.6 (ref 1.1–2.5)
Albumin: 4.6 g/dL (ref 3.5–4.8)
Alkaline Phosphatase: 94 IU/L (ref 39–117)
BUN/Creatinine Ratio: 11 (ref 11–26)
BUN: 15 mg/dL (ref 8–27)
CO2: 25 mmol/L (ref 18–29)
Calcium: 11 mg/dL — ABNORMAL HIGH (ref 8.6–10.2)
Chloride: 99 mmol/L (ref 97–108)
Creatinine, Ser: 1.33 mg/dL — ABNORMAL HIGH (ref 0.57–1.00)
GFR calc Af Amer: 46 mL/min/{1.73_m2} — ABNORMAL LOW (ref >59)
GFR calc non Af Amer: 40 mL/min/{1.73_m2} — ABNORMAL LOW (ref >59)
Globulin, Total: 2.9 g/dL (ref 1.5–4.5)
Glucose: 93 mg/dL (ref 65–99)
Potassium: 5.5 mmol/L — ABNORMAL HIGH (ref 3.5–5.2)
Sodium: 142 mmol/L (ref 134–144)
Total Bilirubin: 0.4 mg/dL (ref 0.0–1.2)
Total Protein: 7.5 g/dL (ref 6.0–8.5)

## 2012-12-24 LAB — THYROID PANEL WITH TSH
Free Thyroxine Index: 1.4 (ref 1.2–4.9)
T3 Uptake Ratio: 24 % (ref 24–39)
T4, Total: 5.9 ug/dL (ref 4.5–12.0)
TSH: 3.78 u[IU]/mL (ref 0.450–4.500)

## 2012-12-25 LAB — URINE CULTURE: Organism ID, Bacteria: NO GROWTH

## 2012-12-27 ENCOUNTER — Other Ambulatory Visit: Payer: Self-pay | Admitting: Family Medicine

## 2012-12-27 ENCOUNTER — Telehealth: Payer: Self-pay | Admitting: Family Medicine

## 2012-12-27 NOTE — Progress Notes (Signed)
Called in zofran and valium

## 2012-12-28 ENCOUNTER — Ambulatory Visit (HOSPITAL_COMMUNITY)
Admission: RE | Admit: 2012-12-28 | Discharge: 2012-12-28 | Disposition: A | Discharge status: Home / Self Care | Payer: Medicare Other | Source: Ambulatory Visit | Attending: Family Medicine | Admitting: Family Medicine

## 2012-12-28 DIAGNOSIS — R1011 Right upper quadrant pain: Secondary | ICD-10-CM | POA: Insufficient documentation

## 2012-12-28 DIAGNOSIS — G8929 Other chronic pain: Secondary | ICD-10-CM

## 2012-12-29 ENCOUNTER — Ambulatory Visit (INDEPENDENT_AMBULATORY_CARE_PROVIDER_SITE_OTHER): Payer: Medicare Other | Admitting: Family Medicine

## 2012-12-29 ENCOUNTER — Encounter: Payer: Self-pay | Admitting: Family Medicine

## 2012-12-29 VITALS — BP 146/61 | HR 65 | Temp 97.1°F | Ht 67.0 in | Wt 229.6 lb

## 2012-12-29 DIAGNOSIS — D72829 Elevated white blood cell count, unspecified: Secondary | ICD-10-CM

## 2012-12-29 DIAGNOSIS — E875 Hyperkalemia: Secondary | ICD-10-CM | POA: Diagnosis not present

## 2012-12-29 DIAGNOSIS — R300 Dysuria: Secondary | ICD-10-CM | POA: Diagnosis not present

## 2012-12-29 DIAGNOSIS — R350 Frequency of micturition: Secondary | ICD-10-CM

## 2012-12-29 LAB — POCT URINALYSIS DIPSTICK
Bilirubin, UA: NEGATIVE
Blood, UA: NEGATIVE
Glucose, UA: NEGATIVE
Ketones, UA: NEGATIVE
Leukocytes, UA: NEGATIVE
Nitrite, UA: NEGATIVE
Protein, UA: NEGATIVE
Spec Grav, UA: <=1.005
Urobilinogen, UA: NEGATIVE
pH, UA: 7

## 2012-12-29 LAB — POCT CBC
Granulocyte percent: 61.3 %G (ref 37–80)
HCT, POC: 40.6 % (ref 37.7–47.9)
Hemoglobin: 13.1 g/dL (ref 12.2–16.2)
Lymph, poc: 4 — AB (ref 0.6–3.4)
MCH, POC: 28.9 pg (ref 27–31.2)
MCHC: 32.3 g/dL (ref 31.8–35.4)
MCV: 89.6 fL (ref 80–97)
MPV: 8.7 fL (ref 0–99.8)
POC Granulocyte: 7 — AB (ref 2–6.9)
POC LYMPH PERCENT: 35 %L (ref 10–50)
Platelet Count, POC: 286 10*3/uL (ref 142–424)
RBC: 4.5 M/uL (ref 4.04–5.48)
RDW, POC: 13.3 %
WBC: 11.4 10*3/uL — AB (ref 4.6–10.2)

## 2012-12-29 LAB — POCT UA - MICROSCOPIC ONLY
Bacteria, U Microscopic: NEGATIVE
Casts, Ur, LPF, POC: NEGATIVE
Crystals, Ur, HPF, POC: NEGATIVE
Mucus, UA: NEGATIVE
RBC, urine, microscopic: NEGATIVE
Yeast, UA: NEGATIVE

## 2012-12-29 LAB — POCT UA - MICROALBUMIN: Microalbumin Ur, POC: NEGATIVE mg/L

## 2012-12-29 NOTE — Telephone Encounter (Signed)
lmom 

## 2012-12-29 NOTE — Progress Notes (Signed)
  Subjective:    Patient ID: [name], female    DOB: [birth year], 72 y.o.   MRN: [id number]  HPI This 72 y.o. female presents for evaluation of elevated wbc's, elevated Calcium, Hyperkalemia, and elevated bun and creatinine.  She was having tooth pain and she Was on PCN last week.  She is feeling better.  She was having urinary sx's but is no Longer having them. She feels fine and better after taking a week of PCN for dental problems.   Review of Systems No chest pain, SOB, HA, dizziness, vision change, N/V, diarrhea, constipation, dysuria, urinary urgency or frequency, myalgias, arthralgias or rash.     Objective:   Physical Exam Vital signs noted  Well developed well nourished female.  HEENT - Head atraumatic Normocephalic                Eyes - PERRLA, Conjuctiva - clear Sclera- Clear EOMI                Ears - EAC's Wnl TM's Wnl Gross Hearing WNL                Nose - Nares patent                 Throat - oropharanx wnl Respiratory - Lungs CTA bilateral Cardiac - RRR S1 and S2 without murmur GI - Abdomen soft Nontender and bowel sounds active x 4 Extremities - No edema. Neuro - Grossly intact.       Assessment & Plan:  Urinary frequency - Plan: POCT UA - Microalbumin, POCT UA - Microscopic Only, Urine culture, POCT urinalysis dipstick  Dysuria - Plan: POCT UA - Microalbumin, POCT UA - Microscopic Only, Urine culture  Hyperkalemia - Plan: BMP8+EGFR  Hypercalcemia - Plan: PTH, Intact and Calcium  Leukocytosis, unspecified - Plan: POCT CBC  Dental Problems - This has resolved after taking PCN abx's for a week.  Follow up in 3 months.

## 2012-12-29 NOTE — Telephone Encounter (Signed)
Per pt, sxs have resolved

## 2012-12-29 NOTE — Patient Instructions (Signed)
Hyperkalemia  Hyperkalemia is when you have too much potassium in your blood. This can be a life-threatening condition. Potassium is normally removed (excreted) from the body by the kidneys.  CAUSES   The potassium level in your body can become too high for the following reasons:   You take in too much potassium. You can do this by:   Using salt substitutes. They contain large amounts of potassium.   Taking potassium supplements from your caregiver. The dose may be too high for you.   Eating foods or taking nutritional products with potassium.   You excrete too little potassium. This can happen if:   Your kidneys are not functioning properly. Kidney (renal) disease is a very common cause of hyperkalemia.   You are taking medicines that lower your excretion of potassium, such as certain diuretic medicines.   You have an adrenal gland disease called Addison's disease.   You have a urinary tract obstruction, such as kidney stones.   You are on treatment to mechanically clean your blood (dialysis) and you skip a treatment.   You release a high amount of potassium from your cells into your blood. You may have a condition that causes potassium to move from your cells to your bloodstream. This can happen with:   Injury to muscles or other tissues. Most potassium is stored in the muscles.   Severe burns or infections.   Acidic blood plasma (acidosis). Acidosis can result from many diseases, such as uncontrolled diabetes.  SYMPTOMS   Usually, there are no symptoms unless the potassium is dangerously high or has risen very quickly. Symptoms may include:   Irregular or very slow heartbeat.   Feeling sick to your stomach (nauseous).   Tiredness (fatigue).   Nerve problems such as tingling of the skin, numbness of the hands or feet, weakness, or paralysis.  DIAGNOSIS   A simple blood test can measure the amount of potassium in your body. An electrocardiogram test of the heart can also help make the diagnosis.  The heart may beat dangerously fast or slow down and stop beating with severe hyperkalemia.   TREATMENT   Treatment depends on how bad the condition is and on the underlying cause.   If the hyperkalemia is an emergency (causing heart problems or paralysis), many different medicines can be used alone or together to lower the potassium level briefly. This may include an insulin injection even if you are not diabetic. Emergency dialysis may be needed to remove potassium from the body.   If the hyperkalemia is less severe or dangerous, the underlying cause is treated. This can include taking medicines if needed. Your prescription medicines may be changed. You may also need to take a medicine to help your body get rid of potassium. You may need to eat a diet low in potassium.  HOME CARE INSTRUCTIONS    Take medicines and supplements as directed by your caregiver.   Do not take any over-the-counter medicines, supplements, natural products, herbs, or vitamins without reviewing them with your caregiver. Certain supplements and natural food products can have high amounts of potassium. Other products (such as ibuprofen) can damage weak kidneys and raise your potassium.   You may be asked to do repeat lab tests. Be sure to follow these directions.   If you have kidney disease, you may need to follow a low potassium diet.  SEEK MEDICAL CARE IF:    You notice an irregular or very slow heartbeat.   You feel lightheaded.     You develop weakness that is unusual for you.  SEEK IMMEDIATE MEDICAL CARE IF:    You have shortness of breath.   You have chest discomfort.   You pass out (faint).  MAKE SURE YOU:    Understand these instructions.   Will watch your condition.   Will get help right away if you are not doing well or get worse.  Document Released: 03/28/2002 Document Revised: 06/30/2011 Document Reviewed: 09/12/2010  ExitCare Patient Information 2014 [employer].

## 2012-12-30 LAB — BMP8+EGFR
BUN/Creatinine Ratio: 13 (ref 11–26)
BUN: 13 mg/dL (ref 8–27)
CO2: 25 mmol/L (ref 18–29)
Calcium: 9.9 mg/dL (ref 8.6–10.2)
Chloride: 99 mmol/L (ref 97–108)
Creatinine, Ser: 1.03 mg/dL — ABNORMAL HIGH (ref 0.57–1.00)
GFR calc Af Amer: 63 mL/min/{1.73_m2} (ref >59)
GFR calc non Af Amer: 54 mL/min/{1.73_m2} — ABNORMAL LOW (ref >59)
Glucose: 89 mg/dL (ref 65–99)
Potassium: 4.6 mmol/L (ref 3.5–5.2)
Sodium: 141 mmol/L (ref 134–144)

## 2012-12-30 LAB — URINE CULTURE

## 2012-12-30 LAB — PTH, INTACT AND CALCIUM: PTH: 65 pg/mL (ref 15–65)

## 2012-12-31 ENCOUNTER — Telehealth: Payer: Self-pay | Admitting: Family Medicine

## 2012-12-31 NOTE — Telephone Encounter (Signed)
Message copied by Bearl Mulberry on Fri Dec 31, 2012 11:04 AM ------      Message from: Deatra Canter      Created: Mon Dec 27, 2012  8:09 AM       Need PTH intact and repeat calcium with repeat labs ------

## 2012-12-31 NOTE — Telephone Encounter (Signed)
Pt notified of need for repeat labs.

## 2013-01-05 ENCOUNTER — Telehealth: Payer: Self-pay | Admitting: Family Medicine

## 2013-01-05 NOTE — Telephone Encounter (Signed)
Pt has congestion and cough Husband has appt with [facility] tomorrow Wife wants to be seen at the same time Appt scheduled

## 2013-01-06 ENCOUNTER — Ambulatory Visit: Payer: Medicare Other | Admitting: Family Medicine

## 2013-01-06 ENCOUNTER — Ambulatory Visit (INDEPENDENT_AMBULATORY_CARE_PROVIDER_SITE_OTHER): Payer: Medicare Other | Admitting: Family Medicine

## 2013-01-06 ENCOUNTER — Encounter: Payer: Self-pay | Admitting: Family Medicine

## 2013-01-06 VITALS — BP 149/74 | HR 61 | Temp 98.1°F | Ht 67.0 in | Wt 228.4 lb

## 2013-01-06 DIAGNOSIS — J069 Acute upper respiratory infection, unspecified: Secondary | ICD-10-CM

## 2013-01-06 MED ORDER — AZITHROMYCIN 250 MG PO TABS: ORAL_TABLET | ORAL | Status: DC

## 2013-01-06 NOTE — Progress Notes (Signed)
  Subjective:    Patient ID: Lorraine Leonard, female    DOB: 01-Apr-1941, 72 y.o.   MRN: [id number]  HPI  This 72 y.o. female presents for evaluation of uri sx's and cough for over a week. She has recently had labs and her calcium was elevated and repeat labs and pth intact Were wnl.  She is awaiting stress test for past c/o chest pain. She is not having chest pain  At present.  Review of Systems C/o cough and congestion.   No chest pain, SOB, HA, dizziness, vision change, N/V, diarrhea, constipation, dysuria, urinary urgency or frequency, myalgias, arthralgias or rash.  Objective:   Physical Exam  Vital signs noted  Well developed well nourished female.  HEENT - Head atraumatic Normocephalic                Eyes - PERRLA, Conjuctiva - clear Sclera- Clear EOMI                Ears - EAC's Wnl TM's Wnl Gross Hearing WNL                Nose - Nares patent                 Throat - oropharanx wnl Respiratory - Lungs CTA bilateral Cardiac - RRR S1 and S2 without murmur.      Assessment & Plan:  URI (upper respiratory infection) - Plan: azithromycin (ZITHROMAX) 250 MG tablet Continue coricidin HBP cough medicine otc and follow up prn if sx's persist or continue. Discussed with patient that cardiology consult will be called to her.  Follow up in 3 months.

## 2013-01-06 NOTE — Patient Instructions (Signed)

## 2013-01-21 ENCOUNTER — Other Ambulatory Visit: Payer: Self-pay | Admitting: Nurse Practitioner

## 2013-01-21 DIAGNOSIS — M549 Dorsalgia, unspecified: Secondary | ICD-10-CM

## 2013-01-21 MED ORDER — TRAMADOL HCL 50 MG PO TABS: 50.0000 mg | ORAL_TABLET | Freq: Four times a day (QID) | ORAL | Status: DC | PRN

## 2013-01-21 NOTE — Telephone Encounter (Signed)
Last filled 12/17/12, last seen 01/06/13. If approved route to pool A so it can be called into [facility]

## 2013-01-25 NOTE — Telephone Encounter (Signed)
[facility] called [facility] and left on voice mail

## 2013-01-29 DIAGNOSIS — Z23 Encounter for immunization: Secondary | ICD-10-CM | POA: Diagnosis not present

## 2013-02-09 ENCOUNTER — Ambulatory Visit (INDEPENDENT_AMBULATORY_CARE_PROVIDER_SITE_OTHER): Payer: Medicare Other | Admitting: Cardiology

## 2013-02-09 ENCOUNTER — Encounter: Payer: Self-pay | Admitting: Cardiology

## 2013-02-09 VITALS — BP 160/78 | HR 69 | Ht 68.0 in | Wt 236.0 lb

## 2013-02-09 DIAGNOSIS — I10 Essential (primary) hypertension: Secondary | ICD-10-CM | POA: Diagnosis not present

## 2013-02-09 DIAGNOSIS — H02059 Trichiasis without entropian unspecified eye, unspecified eyelid: Secondary | ICD-10-CM | POA: Diagnosis not present

## 2013-02-09 DIAGNOSIS — R0602 Shortness of breath: Secondary | ICD-10-CM

## 2013-02-09 NOTE — Progress Notes (Signed)
HPI The patient presents for evaluation of chest discomfort. She was seen previously for possible cardiomegaly and I did review this record. In January of last year she had an echo without significant abnormalities. She had some mild LVH but normal left ventricular function. She's been having chest discomfort. This has been having for some time. She points to her left chest. It happens at rest. He goes to her left shoulder. She thinks it is somewhat sharp. It lasts for a few minutes. It is mild in intensity. There are no associated symptoms. She has no associated shortness of breath, PND or orthopnea. She's not describing how dictations, presyncope or syncope. She is very limited pain in her feet and back pain. She ambulates a little stiff but also rides and an electric cart if she's going to the grocery store.  No Known Allergies  Current Outpatient Prescriptions  Medication Sig Dispense Refill  . albuterol (PROVENTIL HFA;VENTOLIN HFA) 108 (90 BASE) MCG/ACT inhaler Inhale 2 puffs into the lungs every 4 (four) hours as needed. For shortness of breath      . aspirin 81 MG tablet Take 81 mg by mouth daily.       Marland Kitchen azelastine (ASTELIN) 137 MCG/SPRAY nasal spray Place 2 sprays into the nose 2 (two) times daily as needed.      . calcium citrate (CALCITRATE - DOSED IN MG ELEMENTAL CALCIUM) 950 MG tablet Take 1 tablet by mouth daily.      . cetirizine (ZYRTEC) 10 MG tablet Take 10 mg by mouth as needed.       . Cholecalciferol (VITAMIN D) 2000 UNITS CAPS Take 1 capsule by mouth daily.      Marland Kitchen conjugated estrogens (PREMARIN) vaginal cream Place 0.5 g vaginally daily. Use as directed      . diazepam (VALIUM) 5 MG tablet Take 1 tablet (5 mg total) by mouth every 6 (six) hours as needed for anxiety.  30 tablet  1  . esomeprazole (NEXIUM) 40 MG capsule Take 1 capsule (40 mg total) by mouth daily before breakfast.  30 capsule  11  . levothyroxine (SYNTHROID, LEVOTHROID) 75 MCG tablet Take 1 tablet (75 mcg  total) by mouth daily.  30 tablet  11  . losartan-hydrochlorothiazide (HYZAAR) 100-25 MG per tablet TAKE 1 TABLET DAILY  90 tablet  0  . magnesium oxide (MAG-OX) 400 MG tablet Take 400 mg by mouth daily.      . mometasone (ELOCON) 0.1 % cream APPLY TO AFFECTED AREA DAILY  30 g  1  . moxifloxacin (VIGAMOX) 0.5 % ophthalmic solution Place 1 drop into the right eye See admin instructions. Pt takes three times daily for 2 days after eye injection      . NON FORMULARY Place 1 Syringe into the right eye every 5 (five) weeks. Injection into eye 4 months      . ondansetron (ZOFRAN) 8 MG tablet Take 1 tablet (8 mg total) by mouth every 8 (eight) hours as needed for nausea.  20 tablet  0  . PATADAY 0.2 % SOLN Place 1 drop into both eyes daily.      . simvastatin (ZOCOR) 40 MG tablet Take 1 tablet (40 mg total) by mouth every evening.  30 tablet  11  . traMADol (ULTRAM) 50 MG tablet Take 1 tablet (50 mg total) by mouth every 6 (six) hours as needed for pain.  90 tablet  0  . trimethoprim (TRIMPEX) 100 MG tablet Take 100 mg by mouth daily.      Marland Kitchen  azithromycin (ZITHROMAX) 250 MG tablet Take 2 po day one and then one po qd x 4 days  6 tablet  0   No current facility-administered medications for this visit.    Past Medical History  Diagnosis Date  . Hypertension   . Diabetes mellitus     pre-diabetic  . Hyperlipidemia   . Allergic rhinitis   . Urticaria   . Hypothyroidism   . Anxiety   . GERD (gastroesophageal reflux disease)   . Lung fibrosis   . Diverticulosis   . Lung fibrosis   . Anxiety   . Depression   . Macular degeneration   . Rheumatoid arthritis(714.0)     Past Surgical History  Procedure Laterality Date  . Cervical discectomy    . 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 Vaginal hysterectomy    . Tubal ligation      ROS:  As stated in the HPI and negative for all other systems.  PHYSICAL EXAM BP 160/78  Pulse 69  Ht 5\' 8"  (1.727 m)  Wt 236 lb (107.049 kg)   BMI 35.89 kg/m2 GENERAL:  Well appearing HEENT:  Pupils equal round and reactive, fundi not visualized, oral mucosa unremarkable NECK:  No jugular venous distention, waveform within normal limits, carotid upstroke brisk and symmetric, no bruits, no thyromegaly LYMPHATICS:  No cervical, inguinal adenopathy LUNGS:  Clear to auscultation bilaterally BACK:  No CVA tenderness CHEST:  Unremarkable HEART:  PMI not displaced or sustained,S1 and S2 within normal limits, no S3, no S4, no clicks, no rubs, no murmurs ABD:  Flat, positive bowel sounds normal in frequency in pitch, no bruits, no rebound, no guarding, no midline pulsatile mass, no hepatomegaly, no splenomegaly EXT:  2 plus pulses throughout, no edema, no cyanosis no clubbing SKIN:  No rashes no nodules NEURO:  Cranial nerves II through XII grossly intact, motor grossly intact throughout PSYCH:  Cognitively intact, oriented to person place and time   EKG:  Sinus rhythm, rate 65, axis within normal limits, possible old inferior infarct, lateral T-wave inversion not change from previous. 02/09/2013  ASSESSMENT AND PLAN  CHEST PAIN:  Her pain is somewhat atypical. However, she does have risk factors and an abnormal EKG. Given this stress testing is indicated. However, she would not be a little walk on a treadmill. Therefore, she will need to have a [employer].  HTN:  Her blood pressure is elevated. She should keep a blood pressure diary to further evaluate this. Further changes can be based on these data points.

## 2013-02-09 NOTE — Patient Instructions (Signed)
The current medical regimen is effective;  continue present plan and medications.  Your physician has requested that you have a lexiscan myoview. For further information please visit [email or web address] Please follow instruction sheet, as given.  Follow up will be based on the results of your testing.

## 2013-02-18 ENCOUNTER — Other Ambulatory Visit: Payer: Self-pay | Admitting: Family Medicine

## 2013-02-21 ENCOUNTER — Ambulatory Visit (INDEPENDENT_AMBULATORY_CARE_PROVIDER_SITE_OTHER): Payer: Medicare Other | Admitting: General Practice

## 2013-02-21 ENCOUNTER — Telehealth: Payer: Self-pay | Admitting: Family Medicine

## 2013-02-21 ENCOUNTER — Encounter: Payer: Self-pay | Admitting: General Practice

## 2013-02-21 VITALS — BP 139/62 | HR 65 | Temp 97.5°F | Ht 68.0 in | Wt 230.0 lb

## 2013-02-21 DIAGNOSIS — L299 Pruritus, unspecified: Secondary | ICD-10-CM | POA: Diagnosis not present

## 2013-02-21 DIAGNOSIS — L509 Urticaria, unspecified: Secondary | ICD-10-CM | POA: Diagnosis not present

## 2013-02-21 MED ORDER — HYDROXYZINE HCL 25 MG PO TABS: 25.0000 mg | ORAL_TABLET | Freq: Every day | ORAL | Status: DC | PRN

## 2013-02-21 MED ORDER — METHYLPREDNISOLONE ACETATE 80 MG/ML IJ SUSP
80.0000 mg | Freq: Once | INTRAMUSCULAR | Status: AC
  Administered 2013-02-21: 80 mg via INTRAMUSCULAR

## 2013-02-21 NOTE — Patient Instructions (Signed)

## 2013-02-21 NOTE — Progress Notes (Signed)
  Subjective:    Patient ID: Lorraine Leonard, female    DOB: 07/17/1940, 72 y.o.   MRN: [id number]  Urticaria This is a recurrent problem. The current episode started in the past 7 days. The problem is unchanged. The affected locations include the left arm and right arm. The rash is characterized by redness and itchiness. It is unknown if there was an exposure to a precipitant. Pertinent negatives include no congestion, cough, diarrhea, eye pain, facial edema, fever, joint pain, shortness of breath, sore throat or vomiting. Past treatments include nothing. There is no history of asthma or eczema.      Review of Systems  Constitutional: Negative for fever and chills.  HENT: Negative for congestion and sore throat.   Eyes: Negative for pain.  Respiratory: Negative for cough, chest tightness and shortness of breath.   Gastrointestinal: Negative for vomiting and diarrhea.  Genitourinary: Negative for dysuria and difficulty urinating.  Musculoskeletal: Negative for joint pain.  Skin:       Hives to both arms  Neurological: Negative for dizziness, weakness and headaches.       Objective:   Physical Exam  Constitutional: She is oriented to person, place, and time. She appears well-developed and well-nourished.  Cardiovascular: Normal rate, regular rhythm and normal heart sounds.   Pulmonary/Chest: Effort normal and breath sounds normal. No respiratory distress. She exhibits no tenderness.  Neurological: She is alert and oriented to person, place, and time.  Skin: Skin is warm and dry. There is erythema.  Bilateral lower arms hives  Psychiatric: She has a normal mood and affect.          Assessment & Plan:  1. Hives  - methylPREDNISolone acetate (DEPO-MEDROL) injection 80 mg; Inject 1 mL (80 mg total) into the muscle once.  2. Itching  - hydrOXYzine (ATARAX/VISTARIL) 25 MG tablet; Take 1 tablet (25 mg total) by mouth daily as needed for itching.  Dispense: 30 tablet; Refill: 1 -RTO  if symptoms worsen or unresolved -increase fluids -Patient verbalized understanding Lorraine Keens, FNP-C

## 2013-02-21 NOTE — Telephone Encounter (Signed)
appt scheduled

## 2013-02-24 ENCOUNTER — Other Ambulatory Visit: Payer: Self-pay | Admitting: Family Medicine

## 2013-03-01 ENCOUNTER — Encounter: Payer: Self-pay | Admitting: Cardiology

## 2013-03-01 ENCOUNTER — Ambulatory Visit (HOSPITAL_COMMUNITY): Payer: Medicare Other | Attending: Cardiology | Admitting: Radiology

## 2013-03-01 VITALS — BP 137/74 | HR 65 | Ht 68.0 in | Wt 225.0 lb

## 2013-03-01 DIAGNOSIS — R002 Palpitations: Secondary | ICD-10-CM | POA: Diagnosis not present

## 2013-03-01 DIAGNOSIS — R110 Nausea: Secondary | ICD-10-CM | POA: Diagnosis not present

## 2013-03-01 DIAGNOSIS — Z8249 Family history of ischemic heart disease and other diseases of the circulatory system: Secondary | ICD-10-CM | POA: Insufficient documentation

## 2013-03-01 DIAGNOSIS — E785 Hyperlipidemia, unspecified: Secondary | ICD-10-CM | POA: Diagnosis not present

## 2013-03-01 DIAGNOSIS — R0602 Shortness of breath: Secondary | ICD-10-CM

## 2013-03-01 DIAGNOSIS — R0609 Other forms of dyspnea: Secondary | ICD-10-CM | POA: Diagnosis not present

## 2013-03-01 DIAGNOSIS — R9431 Abnormal electrocardiogram [ECG] [EKG]: Secondary | ICD-10-CM | POA: Insufficient documentation

## 2013-03-01 DIAGNOSIS — R079 Chest pain, unspecified: Secondary | ICD-10-CM | POA: Diagnosis not present

## 2013-03-01 DIAGNOSIS — R5381 Other malaise: Secondary | ICD-10-CM | POA: Diagnosis not present

## 2013-03-01 DIAGNOSIS — I10 Essential (primary) hypertension: Secondary | ICD-10-CM | POA: Diagnosis not present

## 2013-03-01 DIAGNOSIS — R42 Dizziness and giddiness: Secondary | ICD-10-CM | POA: Insufficient documentation

## 2013-03-01 DIAGNOSIS — R0989 Other specified symptoms and signs involving the circulatory and respiratory systems: Secondary | ICD-10-CM | POA: Insufficient documentation

## 2013-03-01 MED ORDER — TECHNETIUM TC 99M SESTAMIBI GENERIC - CARDIOLITE
11.0000 | Freq: Once | INTRAVENOUS | Status: AC | PRN
  Administered 2013-03-01: 11 via INTRAVENOUS

## 2013-03-01 MED ORDER — REGADENOSON 0.4 MG/5ML IV SOLN
0.4000 mg | Freq: Once | INTRAVENOUS | Status: AC
  Administered 2013-03-01: 0.4 mg via INTRAVENOUS

## 2013-03-01 MED ORDER — TECHNETIUM TC 99M SESTAMIBI GENERIC - CARDIOLITE
33.0000 | Freq: Once | INTRAVENOUS | Status: AC | PRN
  Administered 2013-03-01: 33 via INTRAVENOUS

## 2013-03-01 NOTE — Progress Notes (Signed)
[facility] SITE 3 NUCLEAR MED [address] [place], [place] [address] [phone]    Cardiology Nuclear Med Study  Lorraine Leonard is a 72 y.o. female     MRN : [id number]     DOB: [birth year]  Procedure Date: 03/01/2013  Nuclear Med Background Indication for Stress Test:  Evaluation for Ischemia and Abnormal EKG History:  Echo 2013 ef 60-65%, no known CAD Cardiac Risk Factors: Family History - CAD, Hypertension and Lipids  Symptoms:  Chest Pain, Dizziness, DOE, Fatigue, Nausea and Palpitations   Nuclear Pre-Procedure Caffeine/Decaff Intake:  None NPO After: 11:30pm   Lungs:  clear O2 Sat: 95% on room air. IV 0.9% NS with Angio Cath:  22g  IV Site: R Hand  IV Started by:  Cathlyn Parsons, RN  Chest Size (in):  44 Cup Size: C  Height: 5\' 8"  (1.727 m)  Weight:  225 lb (102.059 kg)  BMI:  Body mass index is 34.22 kg/(m^2). Tech Comments:  n/a    Nuclear Med Study 1 or 2 day study: 1 day  Stress Test Type:  Lexiscan  Reading MD: Olga Millers, MD  Order Authorizing Provider:  Melany Guernsey  Resting Radionuclide: Technetium 57m Sestamibi Resting Radionuclide Dose: 11.0 mCi   Stress Radionuclide:  Technetium 73m Sestamibi  Stress Radionuclide Dose: 33.0 mCi           Stress Protocol Rest HR: 65 Stress HR: 92  Rest BP: 137/74 Stress BP: 154/59  Exercise Time (min): n/a METS: n/a   Predicted Max HR: 148 bpm % Max HR: 62.16 bpm Rate Pressure Product: 69629   Dose of Adenosine (mg):  n/a Dose of Lexiscan: 0.4 mg  Dose of Atropine (mg): n/a Dose of Dobutamine: n/a mcg/kg/min (at max HR)  Stress Test Technologist: Nelson Chimes, BS-ES  Nuclear Technologist:  Dario Guardian, CNMT     Rest Procedure:  Myocardial perfusion imaging was performed at rest 45 minutes following the intravenous administration of Technetium 94m Sestamibi. Rest ECG: Sinus rhythm, short PR, nonspecific ST changes.  Stress Procedure:  The patient received IV Lexiscan 0.4 mg over  15-seconds.  Technetium 38m Sestamibi injected at 30-seconds.  Quantitative spect images were obtained after a 45 minute delay. During the infusion of Lexiscan, patient complained of a "strange" all over feeling and stomach discomfort.  Symptoms began to resolve in recovery.   Stress ECG: No significant ST segment change suggestive of ischemia.  QPS Raw Data Images:  Acquisition technically good; normal left ventricular size. Stress Images:  Normal homogeneous uptake in all areas of the myocardium. Rest Images:  Normal homogeneous uptake in all areas of the myocardium. Subtraction (SDS):  No evidence of ischemia. Transient Ischemic Dilatation (Normal <1.22):  0.97 Lung/Heart Ratio (Normal <0.45):  0.23  Quantitative Gated Spect Images QGS EDV:  57 ml QGS ESV:  11 ml  Impression Exercise Capacity:  Lexiscan with no exercise. BP Response:  Normal blood pressure response. Clinical Symptoms:  No chest pain or dyspnea ECG Impression:  No significant ST segment change suggestive of ischemia. Comparison with Prior Nuclear Study: No previous nuclear study performed  Overall Impression:  Normal stress nuclear study.  LV Ejection Fraction: 81%.  LV Wall Motion:  NL LV Function; NL Wall Motion  Olga Millers

## 2013-03-03 ENCOUNTER — Ambulatory Visit (INDEPENDENT_AMBULATORY_CARE_PROVIDER_SITE_OTHER): Payer: Medicare Other | Admitting: Family Medicine

## 2013-03-03 ENCOUNTER — Encounter (INDEPENDENT_AMBULATORY_CARE_PROVIDER_SITE_OTHER): Payer: Medicare Other | Admitting: Ophthalmology

## 2013-03-03 VITALS — BP 136/64 | HR 79 | Temp 97.1°F | Ht 68.0 in | Wt 228.8 lb

## 2013-03-03 DIAGNOSIS — H3530 Unspecified macular degeneration: Secondary | ICD-10-CM

## 2013-03-03 DIAGNOSIS — L299 Pruritus, unspecified: Secondary | ICD-10-CM | POA: Diagnosis not present

## 2013-03-03 DIAGNOSIS — H35039 Hypertensive retinopathy, unspecified eye: Secondary | ICD-10-CM

## 2013-03-03 DIAGNOSIS — I10 Essential (primary) hypertension: Secondary | ICD-10-CM

## 2013-03-03 DIAGNOSIS — H43819 Vitreous degeneration, unspecified eye: Secondary | ICD-10-CM

## 2013-03-03 DIAGNOSIS — L509 Urticaria, unspecified: Secondary | ICD-10-CM

## 2013-03-03 DIAGNOSIS — H353290 Exudative age-related macular degeneration, unspecified eye, stage unspecified: Secondary | ICD-10-CM | POA: Diagnosis not present

## 2013-03-03 MED ORDER — METHYLPREDNISOLONE ACETATE 80 MG/ML IJ SUSP: 80.0000 mg | Freq: Once | INTRAMUSCULAR | Status: DC

## 2013-03-03 MED ORDER — PREDNISONE 10 MG PO TABS: ORAL_TABLET | ORAL | Status: DC

## 2013-03-03 MED ORDER — HYDROXYZINE HCL 25 MG PO TABS: 25.0000 mg | ORAL_TABLET | Freq: Every day | ORAL | Status: DC | PRN

## 2013-03-03 NOTE — Progress Notes (Signed)
  Subjective:    Patient ID: Lorraine Leonard, female    DOB: [birth year], 72 y.o.   MRN: [id number]  Urticaria This is a recurrent problem. The current episode started 1 to 4 weeks ago (two weeks ago). The affected locations include the head, right buttock, right upper leg, left buttock and right arm. The rash is characterized by itchiness, redness and swelling. She was exposed to nothing. Associated symptoms include shortness of breath (no more than normal). Pertinent negatives include no congestion, cough, diarrhea or rhinorrhea. Past treatments include antihistamine, anti-itch cream and topical steroids. The treatment provided mild relief. There is no history of asthma.   *Pt was seen in office in office a week and half ago. She received a "steriod" shot, which helped the swelling but it returned. Pt denies any new interaction of medications, food, animals, or chemicals.    Review of Systems  HENT: Negative for congestion and rhinorrhea.   Respiratory: Positive for shortness of breath (no more than normal). Negative for cough.   Gastrointestinal: Negative for diarrhea.  All other systems reviewed and are negative.       Objective:   Physical Exam  Vitals reviewed. Constitutional: She is oriented to person, place, and time. She appears well-developed and well-nourished.  Cardiovascular: Normal rate, regular rhythm, normal heart sounds and intact distal pulses.   No murmur heard. Pulmonary/Chest: Effort normal and breath sounds normal. No respiratory distress. She has no wheezes.  Abdominal: Soft. Bowel sounds are normal.  Musculoskeletal: Normal range of motion. She exhibits edema.  Neurological: She is alert and oriented to person, place, and time.  Skin: Skin is warm and dry. Rash noted. There is erythema.  Several areas of erythema with swelling on pts left upper leg, right AC, scalp, and buttocks  Psychiatric: She has a normal mood and affect. Her behavior is normal. Judgment and  thought content normal.    BP 136/64  Pulse 79  Temp(Src) 97.1 F (36.2 C) (Oral)  Ht 5\' 8"  (1.727 m)  Wt 228 lb 12.8 oz (103.783 kg)  BMI 34.80 kg/m2       Assessment & Plan:  Hives - Plan: predniSONE (DELTASONE) 10 MG tablet, methylPREDNISolone acetate (DEPO-MEDROL) injection 80 mg  Itching - Plan: hydrOXYzine (ATARAX/VISTARIL) 25 MG tablet  Deatra Canter FNP

## 2013-03-03 NOTE — Patient Instructions (Signed)

## 2013-03-04 ENCOUNTER — Telehealth: Payer: Self-pay | Admitting: Cardiology

## 2013-03-04 NOTE — Telephone Encounter (Signed)
Pt aware of results 

## 2013-03-04 NOTE — Telephone Encounter (Signed)
Follow Up: ° ° ° °Pt returning call for results.  °

## 2013-03-14 ENCOUNTER — Other Ambulatory Visit: Payer: Self-pay | Admitting: Family Medicine

## 2013-03-15 NOTE — Telephone Encounter (Signed)
Last filled 01/21/13, last seen 03/03/13. If approved route to pool A,rx will print

## 2013-03-19 ENCOUNTER — Other Ambulatory Visit: Payer: Self-pay | Admitting: Family Medicine

## 2013-03-21 ENCOUNTER — Other Ambulatory Visit: Payer: Self-pay

## 2013-03-21 DIAGNOSIS — H02059 Trichiasis without entropian unspecified eye, unspecified eyelid: Secondary | ICD-10-CM | POA: Diagnosis not present

## 2013-03-21 DIAGNOSIS — K219 Gastro-esophageal reflux disease without esophagitis: Secondary | ICD-10-CM

## 2013-03-21 MED ORDER — ESOMEPRAZOLE MAGNESIUM 40 MG PO CPDR: 40.0000 mg | DELAYED_RELEASE_CAPSULE | Freq: Every day | ORAL | Status: DC

## 2013-03-22 ENCOUNTER — Telehealth: Payer: Self-pay | Admitting: Pharmacist

## 2013-03-22 NOTE — Telephone Encounter (Signed)
Patient called to schedule Annual Wellness Visit.  She would like to have at same time as her husband's visit 05/10/13.  Schedule is not out that far - will call her back next week when opened.   Patient asked about rx for ultram / tramadol.  I reviewed record and it looks like rx was printed 03/14/13.  Verified rx if at front desk and hasn't been picked up.   Patient to come in to pick up.

## 2013-03-24 DIAGNOSIS — H02059 Trichiasis without entropian unspecified eye, unspecified eyelid: Secondary | ICD-10-CM | POA: Diagnosis not present

## 2013-04-22 ENCOUNTER — Other Ambulatory Visit: Payer: Self-pay | Admitting: Family Medicine

## 2013-04-27 ENCOUNTER — Other Ambulatory Visit: Payer: Self-pay | Admitting: Family Medicine

## 2013-05-18 ENCOUNTER — Encounter: Payer: Self-pay | Admitting: Family Medicine

## 2013-05-18 ENCOUNTER — Ambulatory Visit (INDEPENDENT_AMBULATORY_CARE_PROVIDER_SITE_OTHER): Payer: Medicare Other | Admitting: Family Medicine

## 2013-05-18 ENCOUNTER — Encounter (INDEPENDENT_AMBULATORY_CARE_PROVIDER_SITE_OTHER): Payer: Self-pay

## 2013-05-18 VITALS — BP 151/66 | HR 65 | Temp 98.6°F | Ht 68.0 in | Wt 234.0 lb

## 2013-05-18 DIAGNOSIS — M1990 Unspecified osteoarthritis, unspecified site: Secondary | ICD-10-CM

## 2013-05-18 DIAGNOSIS — M129 Arthropathy, unspecified: Secondary | ICD-10-CM

## 2013-05-18 DIAGNOSIS — K5900 Constipation, unspecified: Secondary | ICD-10-CM | POA: Diagnosis not present

## 2013-05-18 DIAGNOSIS — Z1239 Encounter for other screening for malignant neoplasm of breast: Secondary | ICD-10-CM

## 2013-05-18 MED ORDER — TRAMADOL HCL 50 MG PO TABS: 50.0000 mg | ORAL_TABLET | Freq: Four times a day (QID) | ORAL | Status: DC | PRN

## 2013-05-18 MED ORDER — LINACLOTIDE 145 MCG PO CAPS: 145.0000 ug | ORAL_CAPSULE | Freq: Every day | ORAL | Status: DC

## 2013-05-18 NOTE — Progress Notes (Signed)
   Subjective:    Patient ID: Lorraine Leonard, female    DOB: April 21, 1941, 72 y.o.   MRN: [id number]  HPI This 73 y.o. female presents for evaluation of hypertension, pulmonary fibrosis, hypothyroidism, And GERD.  She has had a recent stress test which was negative.  She has been experiencing some Constipation.   Review of Systems C/o constipation No chest pain, SOB, HA, dizziness, vision change, N/V, diarrhea, constipation, dysuria, urinary urgency or frequency, myalgias, arthralgias or rash.     Objective:   Physical Exam Vital signs noted  Well developed well nourished female.  HEENT - Head atraumatic Normocephalic                Eyes - PERRLA, Conjuctiva - clear Sclera- Clear EOMI                Ears - EAC's Wnl TM's Wnl Gross Hearing WNL                Throat - oropharanx wnl Respiratory - Lungs CTA bilateral Cardiac - RRR S1 and S2 without murmur GI - Abdomen soft Nontender and bowel sounds active x 4       Assessment & Plan:  Screening breast examination - Plan: HM MAMMOGRAPHY  Arthritis - Plan: traMADol (ULTRAM) 50 MG tablet  Unspecified constipation - Plan: Linaclotide Rolan Lipa) 145 MCG CAPS capsule  [name] FNP

## 2013-05-19 ENCOUNTER — Telehealth: Payer: Self-pay | Admitting: Family Medicine

## 2013-05-19 NOTE — Telephone Encounter (Signed)
ERROR

## 2013-05-23 DIAGNOSIS — H02059 Trichiasis without entropian unspecified eye, unspecified eyelid: Secondary | ICD-10-CM | POA: Diagnosis not present

## 2013-05-24 ENCOUNTER — Other Ambulatory Visit: Payer: Self-pay | Admitting: Family Medicine

## 2013-05-26 ENCOUNTER — Other Ambulatory Visit: Payer: Self-pay | Admitting: Family Medicine

## 2013-05-26 ENCOUNTER — Telehealth: Payer: Self-pay | Admitting: Family Medicine

## 2013-05-26 MED ORDER — LUBIPROSTONE 24 MCG PO CAPS: 24.0000 ug | ORAL_CAPSULE | Freq: Two times a day (BID) | ORAL | Status: DC

## 2013-05-26 NOTE — Telephone Encounter (Signed)
Please advise 

## 2013-05-30 DIAGNOSIS — N3020 Other chronic cystitis without hematuria: Secondary | ICD-10-CM | POA: Diagnosis not present

## 2013-05-30 DIAGNOSIS — R351 Nocturia: Secondary | ICD-10-CM | POA: Diagnosis not present

## 2013-05-31 ENCOUNTER — Telehealth: Payer: Self-pay | Admitting: Family Medicine

## 2013-06-15 ENCOUNTER — Encounter (INDEPENDENT_AMBULATORY_CARE_PROVIDER_SITE_OTHER): Payer: Medicare Other | Admitting: Ophthalmology

## 2013-06-15 DIAGNOSIS — E11319 Type 2 diabetes mellitus with unspecified diabetic retinopathy without macular edema: Secondary | ICD-10-CM

## 2013-06-15 DIAGNOSIS — E1139 Type 2 diabetes mellitus with other diabetic ophthalmic complication: Secondary | ICD-10-CM

## 2013-06-15 DIAGNOSIS — H35039 Hypertensive retinopathy, unspecified eye: Secondary | ICD-10-CM

## 2013-06-15 DIAGNOSIS — H43819 Vitreous degeneration, unspecified eye: Secondary | ICD-10-CM

## 2013-06-15 DIAGNOSIS — I10 Essential (primary) hypertension: Secondary | ICD-10-CM

## 2013-06-15 DIAGNOSIS — H353290 Exudative age-related macular degeneration, unspecified eye, stage unspecified: Secondary | ICD-10-CM | POA: Diagnosis not present

## 2013-06-15 DIAGNOSIS — H2510 Age-related nuclear cataract, unspecified eye: Secondary | ICD-10-CM

## 2013-06-15 DIAGNOSIS — H3530 Unspecified macular degeneration: Secondary | ICD-10-CM

## 2013-06-15 DIAGNOSIS — E1165 Type 2 diabetes mellitus with hyperglycemia: Secondary | ICD-10-CM

## 2013-06-28 ENCOUNTER — Other Ambulatory Visit: Payer: Self-pay | Admitting: Family Medicine

## 2013-06-29 ENCOUNTER — Other Ambulatory Visit: Payer: Self-pay | Admitting: Family Medicine

## 2013-06-29 ENCOUNTER — Ambulatory Visit (INDEPENDENT_AMBULATORY_CARE_PROVIDER_SITE_OTHER): Payer: Medicare Other | Admitting: Family Medicine

## 2013-06-29 ENCOUNTER — Encounter: Payer: Self-pay | Admitting: Family Medicine

## 2013-06-29 VITALS — BP 139/67 | HR 62 | Temp 98.0°F | Ht 68.0 in | Wt 235.8 lb

## 2013-06-29 DIAGNOSIS — F329 Major depressive disorder, single episode, unspecified: Secondary | ICD-10-CM

## 2013-06-29 DIAGNOSIS — M1990 Unspecified osteoarthritis, unspecified site: Secondary | ICD-10-CM

## 2013-06-29 DIAGNOSIS — R5383 Other fatigue: Secondary | ICD-10-CM | POA: Diagnosis not present

## 2013-06-29 DIAGNOSIS — F32A Depression, unspecified: Secondary | ICD-10-CM

## 2013-06-29 DIAGNOSIS — E538 Deficiency of other specified B group vitamins: Secondary | ICD-10-CM | POA: Diagnosis not present

## 2013-06-29 DIAGNOSIS — M129 Arthropathy, unspecified: Secondary | ICD-10-CM

## 2013-06-29 DIAGNOSIS — R5381 Other malaise: Secondary | ICD-10-CM

## 2013-06-29 DIAGNOSIS — E531 Pyridoxine deficiency: Secondary | ICD-10-CM | POA: Diagnosis not present

## 2013-06-29 DIAGNOSIS — F3289 Other specified depressive episodes: Secondary | ICD-10-CM

## 2013-06-29 MED ORDER — LEVOTHYROXINE SODIUM 75 MCG PO TABS: ORAL_TABLET | ORAL | Status: DC

## 2013-06-29 MED ORDER — POLYETHYLENE GLYCOL 3350 17 GM/SCOOP PO POWD: 17.0000 g | Freq: Two times a day (BID) | ORAL | Status: DC | PRN

## 2013-06-29 MED ORDER — GABAPENTIN 100 MG PO CAPS: 100.0000 mg | ORAL_CAPSULE | Freq: Three times a day (TID) | ORAL | Status: DC

## 2013-06-29 MED ORDER — DIAZEPAM 5 MG PO TABS: 5.0000 mg | ORAL_TABLET | Freq: Four times a day (QID) | ORAL | Status: DC | PRN

## 2013-06-29 MED ORDER — TRAMADOL HCL 50 MG PO TABS: 50.0000 mg | ORAL_TABLET | Freq: Four times a day (QID) | ORAL | Status: DC | PRN

## 2013-06-29 MED ORDER — AZELASTINE HCL 0.1 % NA SOLN: 2.0000 | Freq: Two times a day (BID) | NASAL | Status: DC | PRN

## 2013-06-29 NOTE — Progress Notes (Signed)
   Subjective:    Patient ID: Lorraine Leonard, female    DOB: 04-26-1940, 73 y.o.   MRN: [id number]  HPI  This 73 y.o. female presents for evaluation of pain in lower extremities and burning in her lower Extremities.  She takes tramadol for arthritis pain.  She has hx of DDD of the LS spine and she Has had surgery on her back.  She has been having elevated calcium levels and high normal PTH intact.  She is c/o fatigue.  She need refills on her medications.  Review of Systems No chest pain, SOB, HA, dizziness, vision change, N/V, diarrhea, constipation, dysuria, urinary urgency or frequency, myalgias, arthralgias or rash.     Objective:   Physical Exam  Vital signs noted  Well developed well nourished female.  HEENT - Head atraumatic Normocephalic                Eyes - PERRLA, Conjuctiva - clear Sclera- Clear EOMI                Ears - EAC's Wnl TM's Wnl Gross Hearing WNL                Nose - Nares patent                 Throat - oropharanx wnl Respiratory - Lungs CTA bilateral Cardiac - RRR S1 and S2 without murmur GI - Abdomen soft Nontender and bowel sounds active x 4 Extremities - No edema. Neuro - Grossly intact.      Assessment & Plan:  Depression - Plan: diazepam (VALIUM) 5 MG tablet, DISCONTINUED: diazepam (VALIUM) 5 MG tablet  Arthritis - Plan: traMADol (ULTRAM) 50 MG tablet, Vit D  25 hydroxy (rtn osteoporosis monitoring)  Fatigue - Plan: Vitamin B12, CMP14+EGFR, Thyroid Panel With TSH, Vit D  25 hydroxy (rtn osteoporosis monitoring), PTH, Intact and Calcium, CANCELED: POCT CBC  Hypercalcemia - Plan: PTH, Intact and Calcium  [name] FNP

## 2013-06-30 ENCOUNTER — Other Ambulatory Visit: Payer: Self-pay | Admitting: Family Medicine

## 2013-06-30 ENCOUNTER — Telehealth: Payer: Self-pay | Admitting: Family Medicine

## 2013-06-30 LAB — CMP14+EGFR
ALT: 28 IU/L (ref 0–32)
AST: 37 IU/L (ref 0–40)
Albumin/Globulin Ratio: 1.4 (ref 1.1–2.5)
Albumin: 4.2 g/dL (ref 3.5–4.8)
Alkaline Phosphatase: 88 IU/L (ref 39–117)
BUN/Creatinine Ratio: 11 (ref 11–26)
BUN: 11 mg/dL (ref 8–27)
CO2: 23 mmol/L (ref 18–29)
Calcium: 10.2 mg/dL (ref 8.7–10.3)
Chloride: 101 mmol/L (ref 97–108)
Creatinine, Ser: 0.98 mg/dL (ref 0.57–1.00)
GFR calc Af Amer: 67 mL/min/{1.73_m2} (ref >59)
GFR calc non Af Amer: 58 mL/min/{1.73_m2} — ABNORMAL LOW (ref >59)
Globulin, Total: 3.1 g/dL (ref 1.5–4.5)
Glucose: 100 mg/dL — ABNORMAL HIGH (ref 65–99)
Potassium: 4.4 mmol/L (ref 3.5–5.2)
Sodium: 142 mmol/L (ref 134–144)
Total Bilirubin: 0.3 mg/dL (ref 0.0–1.2)
Total Protein: 7.3 g/dL (ref 6.0–8.5)

## 2013-06-30 LAB — THYROID PANEL WITH TSH
Free Thyroxine Index: 1.4 (ref 1.2–4.9)
T3 Uptake Ratio: 26 % (ref 24–39)
T4, Total: 5.3 ug/dL (ref 4.5–12.0)
TSH: 2.16 u[IU]/mL (ref 0.450–4.500)

## 2013-06-30 LAB — PTH, INTACT AND CALCIUM: PTH: 24 pg/mL (ref 15–65)

## 2013-06-30 LAB — VITAMIN B12: Vitamin B-12: 799 pg/mL (ref 211–946)

## 2013-06-30 LAB — VITAMIN D 25 HYDROXY (VIT D DEFICIENCY, FRACTURES): Vit D, 25-Hydroxy: 21.7 ng/mL — ABNORMAL LOW (ref 30.0–100.0)

## 2013-06-30 MED ORDER — VITAMIN D (ERGOCALCIFEROL) 1.25 MG (50000 UNIT) PO CAPS: 50000.0000 [IU] | ORAL_CAPSULE | ORAL | Status: DC

## 2013-06-30 NOTE — Telephone Encounter (Signed)
Spoke with pt regarding Vit D and explained dosage

## 2013-06-30 NOTE — Telephone Encounter (Signed)
Patient last seen in office on 06-29-13. Rx last filled on 06-20-13 for #30. Please advise. If approved please route to Pool A so nurse can phone in to pharmacy

## 2013-07-01 DIAGNOSIS — Z1231 Encounter for screening mammogram for malignant neoplasm of breast: Secondary | ICD-10-CM | POA: Diagnosis not present

## 2013-07-04 ENCOUNTER — Other Ambulatory Visit: Payer: Self-pay | Admitting: Family Medicine

## 2013-07-18 DIAGNOSIS — H02059 Trichiasis without entropian unspecified eye, unspecified eyelid: Secondary | ICD-10-CM | POA: Diagnosis not present

## 2013-07-26 ENCOUNTER — Other Ambulatory Visit: Payer: Self-pay | Admitting: Family Medicine

## 2013-07-28 ENCOUNTER — Encounter: Payer: Self-pay | Admitting: Nurse Practitioner

## 2013-07-28 ENCOUNTER — Ambulatory Visit (INDEPENDENT_AMBULATORY_CARE_PROVIDER_SITE_OTHER): Payer: Medicare Other | Admitting: Nurse Practitioner

## 2013-07-28 VITALS — BP 155/81 | HR 77 | Temp 97.3°F | Wt 233.0 lb

## 2013-07-28 DIAGNOSIS — L0231 Cutaneous abscess of buttock: Secondary | ICD-10-CM | POA: Diagnosis not present

## 2013-07-28 DIAGNOSIS — L03317 Cellulitis of buttock: Secondary | ICD-10-CM

## 2013-07-28 DIAGNOSIS — L500 Allergic urticaria: Secondary | ICD-10-CM | POA: Diagnosis not present

## 2013-07-28 MED ORDER — CEPHALEXIN 500 MG PO CAPS: 500.0000 mg | ORAL_CAPSULE | Freq: Three times a day (TID) | ORAL | Status: DC

## 2013-07-28 MED ORDER — PREDNISONE 20 MG PO TABS: ORAL_TABLET | ORAL | Status: DC

## 2013-07-28 MED ORDER — METHYLPREDNISOLONE ACETATE 80 MG/ML IJ SUSP
80.0000 mg | Freq: Once | INTRAMUSCULAR | Status: AC
  Administered 2013-07-28: 80 mg via INTRAMUSCULAR

## 2013-07-28 NOTE — Patient Instructions (Signed)

## 2013-07-28 NOTE — Progress Notes (Signed)
   Subjective:    Patient ID: Lorraine Leonard, female    DOB: [birth year], 73 y.o.   MRN: [id number]  Urticaria This is a recurrent problem. The current episode started 1 to 4 weeks ago (1 and half weeks). The problem is unchanged. The affected locations include the right shoulder, right upper leg and left upper leg. The rash is characterized by itchiness, pain and redness. She was exposed to nothing. Pertinent negatives include no shortness of breath. Past treatments include topical steroids, anti-itch cream, oral steroids and antihistamine. The treatment provided no relief.   Patient has seen a allergist and was told certain tree pollen may be causing the the hives.  Review of Systems  Respiratory: Negative for shortness of breath.   Cardiovascular: Negative for chest pain.  Skin: Positive for rash.       Urticara  All other systems reviewed and are negative.      Objective:   Physical Exam  Constitutional: She is oriented to person, place, and time. She appears well-developed and well-nourished.  HENT:  Head: Normocephalic.  Cardiovascular: Normal rate, regular rhythm and normal heart sounds.   Pulmonary/Chest: Effort normal and breath sounds normal.  Neurological: She is alert and oriented to person, place, and time.  Skin: Skin is warm and dry. Rash noted. Rash is urticarial (located on right upper chest, left, and right upper leg).  2cm raised erythematous lesion left buttocks  Psychiatric: She has a normal mood and affect. Her behavior is normal. Judgment and thought content normal.   BP 155/81  Pulse 77  Temp(Src) 97.3 F (36.3 C) (Oral)  Wt 233 lb (105.688 kg)  SpO2 96%        Assessment & Plan:   1. Allergic urticaria   2. Cellulitis and abscess of buttock      Meds ordered this encounter  Medications  . methylPREDNISolone acetate (DEPO-MEDROL) injection 80 mg    Sig:   . predniSONE (DELTASONE) 20 MG tablet    Sig: 2 po qd X5 days    Dispense:  10 tablet    Refill:  0    Order Specific Question:  Supervising Provider    Answer:  Chipper Herb [[id number]]  . cephALEXin (KEFLEX) 500 MG capsule    Sig: Take 1 capsule (500 mg total) by mouth 3 (three) times daily.    Dispense:  30 capsule    Refill:  0    Order Specific Question:  Supervising Provider    Answer:  Chipper Herb [[id number]]   Keep diary of what you eat prior to hives occuring Avoid scratching  Keep area clean and dry DO NOT PICK AT AREA RTO if symptoms continue or worsen  Mary-Margaret Hassell Done, FNP

## 2013-08-19 ENCOUNTER — Telehealth: Payer: Self-pay | Admitting: Nurse Practitioner

## 2013-08-19 NOTE — Telephone Encounter (Signed)
Patient wants to know if you will order this shot and give it to her for chronic hives?

## 2013-08-20 NOTE — Telephone Encounter (Signed)
Please check and see if we do xolair in our office- we use to but I do not think we do anymore

## 2013-08-22 NOTE — Telephone Encounter (Signed)
Let patient know needs to see pulmonology for that- has she already seen one- or go back to who rx.

## 2013-08-22 NOTE — Telephone Encounter (Signed)
Patient has already seen one and will call them for an appointment.Marland KitchenMarland KitchenMarland Kitchen

## 2013-08-22 NOTE — Telephone Encounter (Signed)
Talked to Lorraine Leonard and we no longer we refer out to pulmonology

## 2013-08-23 ENCOUNTER — Telehealth: Payer: Self-pay | Admitting: Nurse Practitioner

## 2013-08-23 NOTE — Telephone Encounter (Signed)
carlon took this call

## 2013-08-31 ENCOUNTER — Other Ambulatory Visit: Payer: Self-pay | Admitting: Family Medicine

## 2013-09-01 NOTE — Telephone Encounter (Signed)
Last seen 07/28/13  MMM

## 2013-09-21 ENCOUNTER — Other Ambulatory Visit: Payer: Self-pay | Admitting: Family Medicine

## 2013-10-19 ENCOUNTER — Encounter (INDEPENDENT_AMBULATORY_CARE_PROVIDER_SITE_OTHER): Payer: Medicare Other | Admitting: Ophthalmology

## 2013-10-19 DIAGNOSIS — E11319 Type 2 diabetes mellitus with unspecified diabetic retinopathy without macular edema: Secondary | ICD-10-CM | POA: Diagnosis not present

## 2013-10-19 DIAGNOSIS — E1165 Type 2 diabetes mellitus with hyperglycemia: Secondary | ICD-10-CM | POA: Diagnosis not present

## 2013-10-19 DIAGNOSIS — H3530 Unspecified macular degeneration: Secondary | ICD-10-CM | POA: Diagnosis not present

## 2013-10-19 DIAGNOSIS — E1139 Type 2 diabetes mellitus with other diabetic ophthalmic complication: Secondary | ICD-10-CM

## 2013-10-19 DIAGNOSIS — H43819 Vitreous degeneration, unspecified eye: Secondary | ICD-10-CM

## 2013-10-19 DIAGNOSIS — H35039 Hypertensive retinopathy, unspecified eye: Secondary | ICD-10-CM

## 2013-10-19 DIAGNOSIS — H353290 Exudative age-related macular degeneration, unspecified eye, stage unspecified: Secondary | ICD-10-CM | POA: Diagnosis not present

## 2013-10-19 DIAGNOSIS — I10 Essential (primary) hypertension: Secondary | ICD-10-CM

## 2013-11-01 ENCOUNTER — Ambulatory Visit (INDEPENDENT_AMBULATORY_CARE_PROVIDER_SITE_OTHER): Payer: Medicare Other | Admitting: Family Medicine

## 2013-11-01 ENCOUNTER — Encounter: Payer: Self-pay | Admitting: Family Medicine

## 2013-11-01 VITALS — BP 125/60 | HR 75 | Temp 97.4°F | Ht 68.0 in | Wt 235.0 lb

## 2013-11-01 DIAGNOSIS — I10 Essential (primary) hypertension: Secondary | ICD-10-CM

## 2013-11-01 DIAGNOSIS — M129 Arthropathy, unspecified: Secondary | ICD-10-CM

## 2013-11-01 DIAGNOSIS — F411 Generalized anxiety disorder: Secondary | ICD-10-CM | POA: Diagnosis not present

## 2013-11-01 DIAGNOSIS — E785 Hyperlipidemia, unspecified: Secondary | ICD-10-CM

## 2013-11-01 DIAGNOSIS — R5381 Other malaise: Secondary | ICD-10-CM

## 2013-11-01 DIAGNOSIS — G609 Hereditary and idiopathic neuropathy, unspecified: Secondary | ICD-10-CM

## 2013-11-01 DIAGNOSIS — M1990 Unspecified osteoarthritis, unspecified site: Secondary | ICD-10-CM

## 2013-11-01 DIAGNOSIS — K5900 Constipation, unspecified: Secondary | ICD-10-CM

## 2013-11-01 DIAGNOSIS — R5383 Other fatigue: Secondary | ICD-10-CM

## 2013-11-01 MED ORDER — SIMVASTATIN 40 MG PO TABS: 40.0000 mg | ORAL_TABLET | Freq: Every evening | ORAL | Status: DC

## 2013-11-01 MED ORDER — DIAZEPAM 5 MG PO TABS: 5.0000 mg | ORAL_TABLET | Freq: Two times a day (BID) | ORAL | Status: DC | PRN

## 2013-11-01 MED ORDER — TRAMADOL HCL 50 MG PO TABS: 50.0000 mg | ORAL_TABLET | Freq: Four times a day (QID) | ORAL | Status: DC | PRN

## 2013-11-01 MED ORDER — LOSARTAN POTASSIUM-HCTZ 100-25 MG PO TABS: 1.0000 | ORAL_TABLET | Freq: Every day | ORAL | Status: DC

## 2013-11-01 MED ORDER — GABAPENTIN 300 MG PO CAPS: 300.0000 mg | ORAL_CAPSULE | Freq: Three times a day (TID) | ORAL | Status: DC

## 2013-11-01 MED ORDER — LUBIPROSTONE 24 MCG PO CAPS: 24.0000 ug | ORAL_CAPSULE | Freq: Two times a day (BID) | ORAL | Status: DC

## 2013-11-01 MED ORDER — DIAZEPAM 5 MG PO TABS
5.0000 mg | ORAL_TABLET | Freq: Two times a day (BID) | ORAL | Status: DC | PRN
Start: 2013-11-01 — End: 2014-04-10

## 2013-11-02 ENCOUNTER — Ambulatory Visit (INDEPENDENT_AMBULATORY_CARE_PROVIDER_SITE_OTHER)
Admission: RE | Admit: 2013-11-02 | Discharge: 2013-11-02 | Disposition: A | Discharge status: Home / Self Care | Payer: Medicare Other | Source: Ambulatory Visit | Attending: Emergency Medicine | Admitting: Emergency Medicine

## 2013-11-02 ENCOUNTER — Encounter: Payer: Self-pay | Admitting: Emergency Medicine

## 2013-11-02 ENCOUNTER — Ambulatory Visit (INDEPENDENT_AMBULATORY_CARE_PROVIDER_SITE_OTHER): Payer: Medicare Other | Admitting: Emergency Medicine

## 2013-11-02 VITALS — BP 122/80 | HR 79 | Ht 68.0 in | Wt 234.0 lb

## 2013-11-02 DIAGNOSIS — J8410 Pulmonary fibrosis, unspecified: Secondary | ICD-10-CM

## 2013-11-02 DIAGNOSIS — J849 Interstitial pulmonary disease, unspecified: Secondary | ICD-10-CM

## 2013-11-02 DIAGNOSIS — R05 Cough: Secondary | ICD-10-CM | POA: Diagnosis not present

## 2013-11-02 DIAGNOSIS — R059 Cough, unspecified: Secondary | ICD-10-CM | POA: Diagnosis not present

## 2013-11-02 NOTE — Progress Notes (Signed)
Subjective:    Patient ID: Lorraine Leonard, female    DOB: 07-03-1940, 73 y.o.   MRN: [id number]  HPI This 73 y.o. female presents for evaluation of hypertension, GERD, and hyperlipidemia. She has been having constipation.  She has hx of anxiety and takes valium on occasion. She has OA and pain.   Review of Systems    No chest pain, SOB, HA, dizziness, vision change, N/V, diarrhea, constipation, dysuria, urinary urgency or frequency, myalgias, arthralgias or rash.  Objective:   Physical Exam Vital signs noted  Well developed well nourished female.  HEENT - Head atraumatic Normocephalic                Eyes - PERRLA, Conjuctiva - clear Sclera- Clear EOMI                Ears - EAC's Wnl TM's Wnl Gross Hearing WNL                Nose - Nares patent                 Throat - oropharanx wnl Respiratory - Lungs CTA bilateral Cardiac - RRR S1 and S2 without murmur GI - Abdomen soft Nontender and bowel sounds active x 4 Extremities - No edema. Neuro - Grossly intact.        Assessment & Plan:  Arthritis - Plan: traMADol (ULTRAM) 50 MG tablet  Other and unspecified hyperlipidemia - Plan: simvastatin (ZOCOR) 40 MG tablet, POCT CBC, CMP14+EGFR, Lipid panel  Unspecified hereditary and idiopathic peripheral neuropathy - Plan: gabapentin (NEURONTIN) 300 MG capsule, traMADol (ULTRAM) 50 MG tablet  Anxiety state, unspecified - Plan: diazepam (VALIUM) 5 MG tablet  Essential hypertension, benign - Plan: losartan-hydrochlorothiazide (HYZAAR) 100-25 MG per tablet  Other malaise and fatigue - Plan: POCT CBC, CMP14+EGFR, Vitamin B12, TSH  Unspecified constipation - Plan: lubiprostone (AMITIZA) 24 MCG capsule  [name] FNP

## 2013-11-02 NOTE — Assessment & Plan Note (Signed)
Stable. Continue current meds.   

## 2013-11-02 NOTE — Assessment & Plan Note (Signed)
In setting of RA, not on immunosuppression. Clinically stable. Will check CXR for interval change. She will need a repeat CT scan chest next year to compare with 2013.

## 2013-11-02 NOTE — Progress Notes (Signed)
Subjective:    Patient ID: Lorraine Leonard, female    DOB: [birth year], 73 y.o.   MRN: [id number]  HPI 73 yo woman, never smoker, hx HTN, allergies, hyperlipidemia, ? Asthma. Seen here by Dr MR for cough, found to have some LLL interstitial changes and nodular changes on CT scan 2008. She reports more SOB than her last visit, has gained about 35 pounds. She has occasional cough and a tickle in her throat. Freq throat clearing.   ROV 02/03/12 -- hx cough, ? 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 Last time we repeated CT scan of the chest, ordered PFT. Returns feeling that her breathing is the same - no new sx. She is on zyrtec and nexium. Has been on nasal steroid in the past. She has lost 5 lbs.   ROV 08/11/12 -- hx cough, ? 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 Returns for f/u >> states that addition of astelin to zyrtec has been helpful - less PND and cough.   ROV 11/02/13 --Hx of allergies, RA, hyperlipidemia,  follows for cough, ILD. Last CT scan was 11/2011, last CXR was stable in 2014.  Not on immunosuppression for RA at this time. Breathing stable, minimal cough.    No evidence obstruction on spirometry.  PULMONARY FUNCTON TEST 02/03/2012  FVC 2.91  FEV1 2.1  FEV1/FVC 72.2  FVC  % Predicted 89  FEV % Predicted 90  FeF 25-75 1.45  FeF 25-75 % Predicted 2.5    Past Medical History  Diagnosis Date  . Hypertension   . Diabetes mellitus     pre-diabetic  . Hyperlipidemia   . Allergic rhinitis   . Urticaria   . Hypothyroidism   . Anxiety   . GERD (gastroesophageal reflux disease)   . Lung fibrosis   . Diverticulosis   . Anxiety   . Depression   . Macular degeneration   . Rheumatoid arthritis(714.0)      Family History  Problem Relation Age of Onset  . Diabetes    . Hypertension    . Coronary artery disease    . Stroke    . Asthma    . Kidney disease Sister   . Lung cancer Sister   . Lung cancer Sister   . Suicidality Son    committed suicide in 2009  . Hyperlipidemia    . Anxiety disorder    . Migraines       History   Social History  . Marital Status: Married    Spouse Name: N/A    Number of Children: 4  . Years of Education: N/A   Occupational History  . retired     [employer]   Social History Main Topics  . Smoking status: Never Smoker   . Smokeless tobacco: Never Used  . Alcohol Use: No  . Drug Use: No  . Sexual Activity: Not on file   Other Topics Concern  . 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 albuterol (PROVENTIL HFA;VENTOLIN HFA) 108 (90 BASE) MCG/ACT inhaler Inhale 2 puffs into the lungs every 4 (four) hours as needed. For shortness of breath      . aspirin 81 MG tablet Take 81 mg by mouth daily.       Marland Kitchen  azelastine (ASTELIN) 137 MCG/SPRAY nasal spray Place 2 sprays into both nostrils 2 (two) times daily as needed.  30 mL  11  . calcium citrate (CALCITRATE - DOSED IN MG ELEMENTAL CALCIUM) 950 MG tablet Take 1 tablet by mouth daily.      . cetirizine (ZYRTEC) 10 MG tablet TAKE 1 TABLET BY MOUTH EVERY MORNING  30 tablet  5  . Cholecalciferol (VITAMIN D) 2000 UNITS CAPS Take 1 capsule by mouth daily.      Marland Kitchen conjugated estrogens (PREMARIN) vaginal cream Place 0.5 g vaginally daily. Use as directed      . cyanocobalamin 500 MCG tablet Take 500 mcg by mouth daily.      . diazepam (VALIUM) 5 MG tablet Take 1 tablet (5 mg total) by mouth every 12 (twelve) hours as needed for anxiety.  30 tablet  3  . esomeprazole (NEXIUM) 40 MG capsule Take 1 capsule (40 mg total) by mouth daily before breakfast.  30 capsule  4  . gabapentin (NEURONTIN) 300 MG capsule Take 1 capsule (300 mg total) by mouth 3 (three) times daily.  90 capsule  11  . levothyroxine (SYNTHROID, LEVOTHROID) 75 MCG tablet TAKE 1 TABLET (75 MCG TOTAL) BY MOUTH DAILY.   30 tablet  5  . losartan-hydrochlorothiazide (HYZAAR) 100-25 MG per tablet Take 1 tablet by mouth daily.  90 tablet  3  . mometasone (ELOCON) 0.1 % cream APPLY TO AFFECTED AREA DAILY  45 g  1  . moxifloxacin (VIGAMOX) 0.5 % ophthalmic solution Place 1 drop into the right eye See admin instructions. Pt takes three times daily for 2 days after eye injection      . multivitamin-lutein (OCUVITE-LUTEIN) CAPS capsule Take 1 capsule by mouth daily.      . ondansetron (ZOFRAN) 8 MG tablet TAKE 1 TABLET EVERY 8 HOURS AS NEEDED FOR NAUSEA  20 tablet  0  . PATADAY 0.2 % SOLN Place 1 drop into both eyes daily.      . polyethylene glycol powder (GLYCOLAX/MIRALAX) powder Take 17 g by mouth 2 (two) times daily as needed.  527 g  11  . simvastatin (ZOCOR) 40 MG tablet Take 1 tablet (40 mg total) by mouth every evening.  30 tablet  11  . traMADol (ULTRAM) 50 MG tablet Take 1 tablet (50 mg total) by mouth every 6 (six) hours as needed.  90 tablet  3  . trimethoprim (TRIMPEX) 100 MG tablet Take 100 mg by mouth daily.      . vitamin C (ASCORBIC ACID) 500 MG tablet Take 500 mg by mouth daily.      . vitamin E 400 UNIT capsule Take 400 Units by mouth daily.      Marland Kitchen lubiprostone (AMITIZA) 24 MCG capsule Take 1 capsule (24 mcg total) by mouth 2 (two) times daily with a meal.  60 capsule  11   Facility-Administered Medications Prior to Visit  Medication Dose Route Frequency Provider Last Rate Last Dose  . methylPREDNISolone acetate (DEPO-MEDROL) injection 80 mg  80 mg Intramuscular Once [name], FNP             Objective:   Physical Exam Filed Vitals:   11/02/13 1436  BP: 122/80  Pulse: 79    Gen: Pleasant, obese woman, in no distress,  normal affect  ENT: No lesions,  mouth clear,  oropharynx clear, no postnasal drip  Neck: No JVD, no TMG, no carotid bruits  Lungs: No use of accessory muscles, few insp crackles  L base  Cardiovascular: RRR, heart sounds normal, no murmur or gallops, no  peripheral edema  Musculoskeletal: No deformities, no cyanosis or clubbing  Neuro: alert, non focal  Skin: Warm, no lesions or rashes    CT SCAN CHEST 12/19/11:  Comparison: Chest CT 01/12/2007.  Findings:  Mediastinum: Heart size is normal. There is no significant  pericardial fluid, thickening or pericardial calcification. The  pulmonic trunk is mildly dilated measuring 3.8 cm in diameter. No  pathologically enlarged mediastinal or hilar lymph nodes. Please  note that accurate exclusion of hilar adenopathy is limited on  noncontrast CT scans. Esophagus is unremarkable in appearance.  There is atherosclerosis of the thoracic aorta, the great vessels  of the mediastinum and the coronary arteries, including calcified  atherosclerotic plaque in the left anterior descending coronary  arteries.  Lungs/Pleura: No acute consolidative airspace disease. No pleural  effusions. Calcified granuloma in the right lower lobe. 6 mm  subpleural nodule associated with the lateral aspect of the right  major fissure (image 35 of series 5). Multiple other tiny 1-2 mm  nodules in the periphery of the lungs bilaterally, are favored to  represent areas of mucoid impaction within terminal bronchioles.  No other larger more suspicious appearing pulmonary nodules or  masses are otherwise identified. In the lungs bilaterally there  are some patchy areas of very mild peripheral subpleural  reticulation, most pronounced in the left lower lobe. In the left  lower lobe there is also some bronchial wall thickening with mild  cylindrical bronchiectasis and peripheral bronchiolectasis. These  findings are very similar in comparison to prior study 01/12/2007.  Inspiratory and expiratory imaging is unremarkable.  Upper Abdomen: Mild decreased attenuation throughout the hepatic  parenchyma suggestive of very mild hepatic steatosis.  Musculoskeletal: There are no aggressive appearing lytic or blastic  lesions noted  in the visualized portions of the skeleton.  IMPRESSION:  1. While there are some very mild fibrotic changes in the lungs,  most pronounced in the left lower lobe, the pattern is highly  nonspecific and is unchanged compared to remote prior examination  01/12/2007. Overall, this is favored to represent sequela of prior  infection/inflammation, rather than evidence of underlying  interstitial lung disease.  2. Mild dilatation of the pulmonic trunk (3.8 cm in diameter).  Clinical correlation for signs and symptoms of pulmonary arterial  hypertension may be warranted.  3. Atherosclerosis, including left anterior descending coronary  artery disease. Assessment for potential risk factor modification,  dietary therapy or pharmacologic therapy may be warranted, if  clinically indicated.  4. Mild hepatic steatosis.       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 Clinically stable. Will check CXR for interval change. She will need a repeat CT scan chest next year to compare with 2013.   COUGH Stable. Continue current meds.

## 2013-11-02 NOTE — Patient Instructions (Signed)
We will repeat your CXR today  Continue your current medications as you have been taking them  We will plan to repeat your CT scan of the chest next Summer, 2016.  Follow with Dr Lamonte Sakai in 12 months or sooner if you have any problems

## 2013-11-14 ENCOUNTER — Telehealth: Payer: Self-pay | Admitting: Family Medicine

## 2013-11-14 ENCOUNTER — Telehealth: Payer: Self-pay | Admitting: Emergency Medicine

## 2013-11-14 NOTE — Telephone Encounter (Signed)
Patient is requesting her recent cxr results from 11/02/13 be advised.  Please advise Dr Melvyn Novas as Dr Lamonte Sakai not in office.  Results are in EPIC Thanks.

## 2013-11-14 NOTE — Telephone Encounter (Signed)
Say labs needs to be collected patient said blood was drawn but there are no results in computer

## 2013-11-14 NOTE — Telephone Encounter (Signed)
Called and spoke to pt. Informed pt of results per MW. Pt verbalized understanding and denied any further questions or concerns at this time.

## 2013-11-14 NOTE — Telephone Encounter (Signed)
Minimal changes of ? Significance, Dr Lamonte Sakai will need to go over the particulars but nothing at all to worry about in meantime

## 2013-11-16 ENCOUNTER — Telehealth: Payer: Self-pay

## 2013-11-16 NOTE — Telephone Encounter (Signed)
I want to speak with Dr Sallyanne Havers

## 2013-11-16 NOTE — Telephone Encounter (Signed)
Please call her for me

## 2013-11-18 ENCOUNTER — Telehealth: Payer: Self-pay | Admitting: Family Medicine

## 2013-11-18 DIAGNOSIS — L509 Urticaria, unspecified: Secondary | ICD-10-CM

## 2013-11-18 NOTE — Telephone Encounter (Signed)
Pt wants a referral to dermatologist for recurrent hives Referral entered

## 2013-11-29 ENCOUNTER — Ambulatory Visit (INDEPENDENT_AMBULATORY_CARE_PROVIDER_SITE_OTHER): Payer: Medicare Other | Admitting: Family Medicine

## 2013-11-29 ENCOUNTER — Encounter: Payer: Self-pay | Admitting: Family Medicine

## 2013-11-29 VITALS — BP 155/71 | HR 72 | Temp 98.4°F | Ht 68.0 in | Wt 237.8 lb

## 2013-11-29 DIAGNOSIS — R21 Rash and other nonspecific skin eruption: Secondary | ICD-10-CM

## 2013-11-29 MED ORDER — HYDROXYZINE HCL 25 MG PO TABS: 25.0000 mg | ORAL_TABLET | Freq: Three times a day (TID) | ORAL | Status: DC | PRN

## 2013-11-29 MED ORDER — METHYLPREDNISOLONE (PAK) 4 MG PO TABS: ORAL_TABLET | ORAL | Status: DC

## 2013-11-29 MED ORDER — METHYLPREDNISOLONE ACETATE 80 MG/ML IJ SUSP
80.0000 mg | Freq: Once | INTRAMUSCULAR | Status: AC
  Administered 2013-11-29: 80 mg via INTRAMUSCULAR

## 2013-11-29 NOTE — Progress Notes (Signed)
   Subjective:    Patient ID: Lorraine Leonard, female    DOB: 1940/06/22, 73 y.o.   MRN: [id number]  HPI This 73 y.o. female presents for evaluation of allergies and hives.  She is c/o urticaria and rash.   Review of Systems No chest pain, SOB, HA, dizziness, vision change, N/V, diarrhea, constipation, dysuria, urinary urgency or frequency, myalgias, arthralgias or rash.     Objective:   Physical Exam Vital signs noted  Well developed well nourished female.  HEENT - Head atraumatic Normocephalic Respiratory - Lungs CTA bilateral Cardiac - RRR S1 and S2 without murmur GI - Abdomen soft Nontender and bowel sounds active x 4 Extremities - No edema. Neuro - Grossly intact. Skin - Urticaria bilateral arms, legs, and neck      Assessment & Plan:  Rash and nonspecific skin eruption - Plan: methylPREDNISolone acetate (DEPO-MEDROL) injection 80 mg.  Hydroxyzine 25mg  one po qid prn #30  [name] FNP

## 2013-12-09 ENCOUNTER — Telehealth: Payer: Self-pay | Admitting: Family Medicine

## 2013-12-12 NOTE — Telephone Encounter (Signed)
Pt said it was a mistake and she has been getting meds. Doesn't need anything at this time.

## 2013-12-16 ENCOUNTER — Other Ambulatory Visit: Payer: Self-pay | Admitting: Nurse Practitioner

## 2013-12-19 DIAGNOSIS — H1045 Other chronic allergic conjunctivitis: Secondary | ICD-10-CM | POA: Diagnosis not present

## 2013-12-19 DIAGNOSIS — L501 Idiopathic urticaria: Secondary | ICD-10-CM | POA: Diagnosis not present

## 2013-12-19 DIAGNOSIS — J45909 Unspecified asthma, uncomplicated: Secondary | ICD-10-CM | POA: Diagnosis not present

## 2013-12-19 DIAGNOSIS — J301 Allergic rhinitis due to pollen: Secondary | ICD-10-CM | POA: Diagnosis not present

## 2013-12-21 ENCOUNTER — Telehealth: Payer: Self-pay | Admitting: Emergency Medicine

## 2013-12-21 NOTE — Telephone Encounter (Signed)
Called PT line busy wcb

## 2013-12-22 ENCOUNTER — Telehealth: Payer: Self-pay | Admitting: Family Medicine

## 2013-12-22 NOTE — Telephone Encounter (Signed)
Patient is requesting a increase in her Gabapentin. Her feet still like pens sticking her and hurting. Please advise she is taking 300 tid

## 2013-12-22 NOTE — Telephone Encounter (Signed)
lmomtcb for pt 

## 2013-12-23 ENCOUNTER — Other Ambulatory Visit: Payer: Self-pay | Admitting: Family Medicine

## 2013-12-23 MED ORDER — GABAPENTIN 600 MG PO TABS: 600.0000 mg | ORAL_TABLET | Freq: Three times a day (TID) | ORAL | Status: DC

## 2013-12-23 NOTE — Telephone Encounter (Signed)
Increased gabapentin and rx sent to pharmacy

## 2013-12-23 NOTE — Telephone Encounter (Signed)
Called spoke with patient who reported that when she saw RB on 7.15.15 she did not have her [employer] voucher (it did not arrive in the mail until after she had gotten home).  Pt is asking that we call [employer] to have a voucher faxed to the office.  Pt requests a call back once this is completed.  P: [phone] option 2, she had spoken with Gaetano Hawthorne: [phone]  Called [employer] and spoke with Horris Latino.  She stated that they are unable to back-date a voucher, but a note on letter-head can be sent to the above fax number (verified with Horris Latino) stating that pt attended the 7.15.15 appt with RB.  No cover sheet needed.  Letter faxed Pt is aware Nothing further needed; will sign off

## 2013-12-28 DIAGNOSIS — J301 Allergic rhinitis due to pollen: Secondary | ICD-10-CM | POA: Diagnosis not present

## 2013-12-28 DIAGNOSIS — L501 Idiopathic urticaria: Secondary | ICD-10-CM | POA: Diagnosis not present

## 2013-12-28 DIAGNOSIS — H1045 Other chronic allergic conjunctivitis: Secondary | ICD-10-CM | POA: Diagnosis not present

## 2013-12-28 DIAGNOSIS — J45909 Unspecified asthma, uncomplicated: Secondary | ICD-10-CM | POA: Diagnosis not present

## 2014-01-14 ENCOUNTER — Other Ambulatory Visit: Payer: Self-pay | Admitting: Family Medicine

## 2014-01-20 ENCOUNTER — Telehealth: Payer: Self-pay | Admitting: Family Medicine

## 2014-01-20 ENCOUNTER — Other Ambulatory Visit: Payer: Self-pay | Admitting: Family Medicine

## 2014-01-20 MED ORDER — GABAPENTIN 100 MG PO CAPS: 100.0000 mg | ORAL_CAPSULE | Freq: Three times a day (TID) | ORAL | Status: DC

## 2014-01-20 NOTE — Telephone Encounter (Signed)
I called in gabapentin 100mg  one po tid.  I recommend if she wants, to try taking gabapentin 300mg  po qhs and then 100mg  po bid for a few weeks then go to 300mg  po qhs and qam and then 100mg  po in the evening and then after 2 weeks then go to 300mg  po tid.

## 2014-01-20 NOTE — Telephone Encounter (Signed)
Patient aware.

## 2014-01-25 DIAGNOSIS — Z23 Encounter for immunization: Secondary | ICD-10-CM | POA: Diagnosis not present

## 2014-02-01 ENCOUNTER — Encounter: Payer: Self-pay | Admitting: Family Medicine

## 2014-02-01 ENCOUNTER — Ambulatory Visit (INDEPENDENT_AMBULATORY_CARE_PROVIDER_SITE_OTHER): Payer: Medicare Other | Admitting: Family Medicine

## 2014-02-01 VITALS — BP 161/67 | HR 61 | Temp 97.0°F | Ht 68.0 in | Wt 238.4 lb

## 2014-02-01 DIAGNOSIS — E538 Deficiency of other specified B group vitamins: Secondary | ICD-10-CM

## 2014-02-01 DIAGNOSIS — I10 Essential (primary) hypertension: Secondary | ICD-10-CM

## 2014-02-01 DIAGNOSIS — G894 Chronic pain syndrome: Secondary | ICD-10-CM

## 2014-02-01 DIAGNOSIS — M1990 Unspecified osteoarthritis, unspecified site: Secondary | ICD-10-CM

## 2014-02-01 LAB — POCT CBC
Granulocyte percent: 67.7 %G (ref 37–80)
HCT, POC: 39.7 % (ref 37.7–47.9)
Hemoglobin: 12.8 g/dL (ref 12.2–16.2)
Lymph, poc: 2.7 (ref 0.6–3.4)
MCH, POC: 28.5 pg (ref 27–31.2)
MCHC: 32.3 g/dL (ref 31.8–35.4)
MCV: 88.1 fL (ref 80–97)
MPV: 8 fL (ref 0–99.8)
POC Granulocyte: 6.7 (ref 2–6.9)
POC LYMPH PERCENT: 27.4 %L (ref 10–50)
Platelet Count, POC: 260 10*3/uL (ref 142–424)
RBC: 4.5 M/uL (ref 4.04–5.48)
RDW, POC: 14.9 %
WBC: 9.9 10*3/uL (ref 4.6–10.2)

## 2014-02-01 MED ORDER — TRAMADOL HCL 50 MG PO TABS: 50.0000 mg | ORAL_TABLET | Freq: Four times a day (QID) | ORAL | Status: DC | PRN

## 2014-02-01 MED ORDER — DICLOFENAC SODIUM 1 % TD GEL: 2.0000 g | Freq: Four times a day (QID) | TRANSDERMAL | Status: DC

## 2014-02-01 NOTE — Progress Notes (Signed)
   Subjective:    Patient ID: [name], female    DOB: November 29, 1940, 72 y.o.   MRN: [id number]  HPI This 73 y.o. female presents for evaluation of routine follow up.   Review of Systems    No chest pain, SOB, HA, dizziness, vision change, N/V, diarrhea, constipation, dysuria, urinary urgency or frequency, myalgias, arthralgias or rash.  Objective:   Physical Exam  Vital signs noted  Well developed well nourished female.  HEENT - Head atraumatic Normocephalic                Eyes - PERRLA, Conjuctiva - clear Sclera- Clear EOMI                Ears - EAC's Wnl TM's Wnl Gross Hearing WNL                Nose - Nares patent                 Throat - oropharanx wnl Respiratory - Lungs CTA bilateral Cardiac - RRR S1 and S2 without murmur GI - Abdomen soft Nontender and bowel sounds active x 4 Extremities - No edema. Neuro - Grossly intact.      Assessment & Plan:  Arthritis - Plan: traMADol (ULTRAM) 50 MG tablet, diclofenac sodium (VOLTAREN) 1 % GEL  Chronic pain syndrome - Plan: traMADol (ULTRAM) 50 MG tablet  Essential hypertension - Plan: POCT CBC, CMP14+EGFR, Lipid panel, TSH  B12 deficiency - Plan: Vitamin B12  [name] FNP

## 2014-02-02 LAB — CMP14+EGFR
ALT: 27 IU/L (ref 0–32)
AST: 30 IU/L (ref 0–40)
Albumin/Globulin Ratio: 1.4 (ref 1.1–2.5)
Albumin: 4.4 g/dL (ref 3.5–4.8)
Alkaline Phosphatase: 89 IU/L (ref 39–117)
BUN/Creatinine Ratio: 11 (ref 11–26)
BUN: 12 mg/dL (ref 8–27)
CO2: 20 mmol/L (ref 18–29)
Calcium: 9.7 mg/dL (ref 8.7–10.3)
Chloride: 103 mmol/L (ref 97–108)
Creatinine, Ser: 1.05 mg/dL — ABNORMAL HIGH (ref 0.57–1.00)
GFR calc Af Amer: 61 mL/min/{1.73_m2} (ref >59)
GFR calc non Af Amer: 53 mL/min/{1.73_m2} — ABNORMAL LOW (ref >59)
Globulin, Total: 3.2 g/dL (ref 1.5–4.5)
Glucose: 110 mg/dL — ABNORMAL HIGH (ref 65–99)
Potassium: 3.9 mmol/L (ref 3.5–5.2)
Sodium: 144 mmol/L (ref 134–144)
Total Bilirubin: 0.4 mg/dL (ref 0.0–1.2)
Total Protein: 7.6 g/dL (ref 6.0–8.5)

## 2014-02-02 LAB — LIPID PANEL
Chol/HDL Ratio: 3.4 ratio units (ref 0.0–4.4)
Cholesterol, Total: 158 mg/dL (ref 100–199)
HDL: 47 mg/dL (ref >39)
LDL Calculated: 72 mg/dL (ref 0–99)
Triglycerides: 194 mg/dL — ABNORMAL HIGH (ref 0–149)
VLDL Cholesterol Cal: 39 mg/dL (ref 5–40)

## 2014-02-02 LAB — TSH: TSH: 4.22 u[IU]/mL (ref 0.450–4.500)

## 2014-02-02 LAB — VITAMIN B12: Vitamin B-12: 1272 pg/mL — ABNORMAL HIGH (ref 211–946)

## 2014-02-08 ENCOUNTER — Other Ambulatory Visit: Payer: Self-pay | Admitting: Nurse Practitioner

## 2014-02-09 NOTE — Telephone Encounter (Signed)
Last seen 02/01/14 B [place]    

## 2014-02-15 ENCOUNTER — Other Ambulatory Visit: Payer: Self-pay | Admitting: Nurse Practitioner

## 2014-03-08 ENCOUNTER — Encounter (INDEPENDENT_AMBULATORY_CARE_PROVIDER_SITE_OTHER): Payer: Medicare Other | Admitting: Ophthalmology

## 2014-03-08 DIAGNOSIS — E11329 Type 2 diabetes mellitus with mild nonproliferative diabetic retinopathy without macular edema: Secondary | ICD-10-CM

## 2014-03-08 DIAGNOSIS — H43813 Vitreous degeneration, bilateral: Secondary | ICD-10-CM | POA: Diagnosis not present

## 2014-03-08 DIAGNOSIS — H1045 Other chronic allergic conjunctivitis: Secondary | ICD-10-CM | POA: Diagnosis not present

## 2014-03-08 DIAGNOSIS — J301 Allergic rhinitis due to pollen: Secondary | ICD-10-CM | POA: Diagnosis not present

## 2014-03-08 DIAGNOSIS — E11319 Type 2 diabetes mellitus with unspecified diabetic retinopathy without macular edema: Secondary | ICD-10-CM | POA: Diagnosis not present

## 2014-03-08 DIAGNOSIS — H35033 Hypertensive retinopathy, bilateral: Secondary | ICD-10-CM

## 2014-03-08 DIAGNOSIS — H3531 Nonexudative age-related macular degeneration: Secondary | ICD-10-CM | POA: Diagnosis not present

## 2014-03-08 DIAGNOSIS — I10 Essential (primary) hypertension: Secondary | ICD-10-CM

## 2014-03-08 DIAGNOSIS — L501 Idiopathic urticaria: Secondary | ICD-10-CM | POA: Diagnosis not present

## 2014-03-08 DIAGNOSIS — J4520 Mild intermittent asthma, uncomplicated: Secondary | ICD-10-CM | POA: Diagnosis not present

## 2014-03-27 ENCOUNTER — Other Ambulatory Visit: Payer: Self-pay | Admitting: Family Medicine

## 2014-04-05 ENCOUNTER — Encounter (INDEPENDENT_AMBULATORY_CARE_PROVIDER_SITE_OTHER): Payer: Medicare Other | Admitting: Ophthalmology

## 2014-04-05 DIAGNOSIS — H3532 Exudative age-related macular degeneration: Secondary | ICD-10-CM

## 2014-04-05 DIAGNOSIS — E11319 Type 2 diabetes mellitus with unspecified diabetic retinopathy without macular edema: Secondary | ICD-10-CM | POA: Diagnosis not present

## 2014-04-05 DIAGNOSIS — H35033 Hypertensive retinopathy, bilateral: Secondary | ICD-10-CM | POA: Diagnosis not present

## 2014-04-05 DIAGNOSIS — H43813 Vitreous degeneration, bilateral: Secondary | ICD-10-CM | POA: Diagnosis not present

## 2014-04-05 DIAGNOSIS — I10 Essential (primary) hypertension: Secondary | ICD-10-CM

## 2014-04-05 DIAGNOSIS — E11329 Type 2 diabetes mellitus with mild nonproliferative diabetic retinopathy without macular edema: Secondary | ICD-10-CM | POA: Diagnosis not present

## 2014-04-08 ENCOUNTER — Other Ambulatory Visit: Payer: Self-pay | Admitting: Family Medicine

## 2014-04-10 ENCOUNTER — Other Ambulatory Visit: Payer: Self-pay | Admitting: Family Medicine

## 2014-04-10 NOTE — Telephone Encounter (Signed)
Last seen 02/01/14 B [place]  If approved route to nurse to call into [facility]

## 2014-04-10 NOTE — Telephone Encounter (Signed)
This is okay 1 as long as the patient is not too early for the prescription refill

## 2014-04-10 NOTE — Telephone Encounter (Signed)
Pt requesting refill on tramadol ok to refill? Please advise and route to pools.

## 2014-04-10 NOTE — Telephone Encounter (Signed)
Please review and advise.

## 2014-05-01 ENCOUNTER — Other Ambulatory Visit: Payer: Self-pay | Admitting: Family Medicine

## 2014-05-03 ENCOUNTER — Encounter (INDEPENDENT_AMBULATORY_CARE_PROVIDER_SITE_OTHER): Payer: Medicare Other | Admitting: Ophthalmology

## 2014-05-03 DIAGNOSIS — H43813 Vitreous degeneration, bilateral: Secondary | ICD-10-CM

## 2014-05-03 DIAGNOSIS — H35033 Hypertensive retinopathy, bilateral: Secondary | ICD-10-CM

## 2014-05-03 DIAGNOSIS — I10 Essential (primary) hypertension: Secondary | ICD-10-CM

## 2014-05-03 DIAGNOSIS — E11329 Type 2 diabetes mellitus with mild nonproliferative diabetic retinopathy without macular edema: Secondary | ICD-10-CM

## 2014-05-03 DIAGNOSIS — E11319 Type 2 diabetes mellitus with unspecified diabetic retinopathy without macular edema: Secondary | ICD-10-CM | POA: Diagnosis not present

## 2014-05-03 DIAGNOSIS — H3532 Exudative age-related macular degeneration: Secondary | ICD-10-CM | POA: Diagnosis not present

## 2014-05-04 ENCOUNTER — Other Ambulatory Visit: Payer: Self-pay | Admitting: Family Medicine

## 2014-05-04 NOTE — Telephone Encounter (Signed)
Last seen 101/4/15 B [place]

## 2014-05-15 ENCOUNTER — Telehealth: Payer: Self-pay | Admitting: Emergency Medicine

## 2014-05-15 NOTE — Telephone Encounter (Signed)
Spoke with pt. Confirmed message. XRAY has been faxed. Nothing further needed

## 2014-05-16 ENCOUNTER — Encounter: Payer: Self-pay | Admitting: Family Medicine

## 2014-05-16 ENCOUNTER — Ambulatory Visit (INDEPENDENT_AMBULATORY_CARE_PROVIDER_SITE_OTHER): Payer: Medicare Other | Admitting: Family Medicine

## 2014-05-16 VITALS — BP 167/74 | HR 74 | Temp 97.5°F | Ht 68.0 in | Wt 245.0 lb

## 2014-05-16 DIAGNOSIS — M129 Arthropathy, unspecified: Secondary | ICD-10-CM | POA: Diagnosis not present

## 2014-05-16 DIAGNOSIS — M1990 Unspecified osteoarthritis, unspecified site: Secondary | ICD-10-CM

## 2014-05-16 DIAGNOSIS — K219 Gastro-esophageal reflux disease without esophagitis: Secondary | ICD-10-CM | POA: Diagnosis not present

## 2014-05-16 DIAGNOSIS — E785 Hyperlipidemia, unspecified: Secondary | ICD-10-CM | POA: Insufficient documentation

## 2014-05-16 DIAGNOSIS — I10 Essential (primary) hypertension: Secondary | ICD-10-CM

## 2014-05-16 DIAGNOSIS — F32A Depression, unspecified: Secondary | ICD-10-CM | POA: Insufficient documentation

## 2014-05-16 DIAGNOSIS — M069 Rheumatoid arthritis, unspecified: Secondary | ICD-10-CM | POA: Insufficient documentation

## 2014-05-16 DIAGNOSIS — J8410 Pulmonary fibrosis, unspecified: Secondary | ICD-10-CM | POA: Diagnosis not present

## 2014-05-16 DIAGNOSIS — F329 Major depressive disorder, single episode, unspecified: Secondary | ICD-10-CM | POA: Insufficient documentation

## 2014-05-16 DIAGNOSIS — F419 Anxiety disorder, unspecified: Secondary | ICD-10-CM | POA: Insufficient documentation

## 2014-05-16 DIAGNOSIS — E039 Hypothyroidism, unspecified: Secondary | ICD-10-CM | POA: Insufficient documentation

## 2014-05-16 LAB — POCT CBC
Granulocyte percent: 72 %G (ref 37–80)
HCT, POC: 41.4 % (ref 37.7–47.9)
Hemoglobin: 13.4 g/dL (ref 12.2–16.2)
Lymph, poc: 2.7 (ref 0.6–3.4)
MCH, POC: 28.8 pg (ref 27–31.2)
MCHC: 32.5 g/dL (ref 31.8–35.4)
MCV: 88.8 fL (ref 80–97)
MPV: 8.5 fL (ref 0–99.8)
POC Granulocyte: 7.5 — AB (ref 2–6.9)
POC LYMPH PERCENT: 25.6 %L (ref 10–50)
Platelet Count, POC: 279 10*3/uL (ref 142–424)
RBC: 4.7 M/uL (ref 4.04–5.48)
RDW, POC: 13.5 %
WBC: 10.4 10*3/uL — AB (ref 4.6–10.2)

## 2014-05-16 MED ORDER — MELOXICAM 15 MG PO TABS: 15.0000 mg | ORAL_TABLET | Freq: Every day | ORAL | Status: DC

## 2014-05-16 NOTE — Progress Notes (Signed)
Subjective:    Patient ID: Lorraine Leonard, female    DOB: [birth year], 74 y.o.   MRN: [id number]  HPI  74 year old female comes in today for 3 month follow up on chronic medical conditions including hyperlipidemia, hypertension, and hypothyroidism. Poor energy. Can't stand up long enough to wash dishes.   Patient presents for follow-up on  thyroid. She has a history of hypothyroidism for many years. It has been stable recently. Pt. denies any change in  voice, loss of hair, heat or cold intolerance. Energy level has been fair to poor. She denies constipation and diarrhea. No myxedema. Medication is as noted below. Verified that pt is taking it daily on an empty stomach. Well tolerated.  Patient in for follow-up of elevated cholesterol. Doing well without complaints on current medication. Denies side effects of statin including myalgia and arthralgia and nausea. Also in today for liver function testing. Currently no chest pain, shortness of breath or other cardiovascular related symptoms noted.  Patient in for follow-up of hypertension. Patient has no history of headache chest pain or shortness of breath or recent cough. Patient also denies symptoms of TIA such as numbness weakness lateralizing. Patient checks  blood pressure at home and has not had any elevated readings recently. Patient denies side effects from his medication. States taking it regularly.  Patient Active Problem List   Diagnosis Date Noted  . Depression   . Hypothyroidism   . Anxiety   . Rheumatoid arthritis   . Hyperlipidemia   . Cardiomegaly 05/05/2011  . Essential hypertension 02/08/2007  . 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 GASTROESOPHAGEAL REFLUX DISEASE 02/08/2007  . DYSPNEA 02/08/2007  . COUGH 02/08/2007   Outpatient Encounter Prescriptions as of 05/16/2014  Medication Sig  . aspirin 81 MG tablet Take 81 mg by mouth daily.   Marland Kitchen azelastine (ASTELIN) 137 MCG/SPRAY nasal spray Place 2  sprays into both nostrils 2 (two) times daily as needed.  . calcium citrate (CALCITRATE - DOSED IN MG ELEMENTAL CALCIUM) 950 MG tablet Take 1 tablet by mouth daily.  . Cholecalciferol (VITAMIN D) 2000 UNITS CAPS Take 1 capsule by mouth daily.  Marland Kitchen conjugated estrogens (PREMARIN) vaginal cream Place 0.5 g vaginally daily. Use as directed  . cyanocobalamin 500 MCG tablet Take 500 mcg by mouth daily.  . diazepam (VALIUM) 5 MG tablet TAKE 1 TABLET EVERY 12 HOURS AS NEEDED FOR ANXIETY  . diclofenac sodium (VOLTAREN) 1 % GEL Apply 2 g topically 4 (four) times daily.  Marland Kitchen esomeprazole (NEXIUM) 40 MG capsule TAKE 1 CAPSULE (40 MG TOTAL) BY MOUTH DAILY BEFORE BREAKFAST.  . hydrOXYzine (ATARAX/VISTARIL) 25 MG tablet Take 1 tablet (25 mg total) by mouth 3 (three) times daily as needed.  Marland Kitchen levocetirizine (XYZAL) 5 MG tablet   . levothyroxine (SYNTHROID, LEVOTHROID) 75 MCG tablet TAKE 1 TABLET (75 MCG TOTAL) BY MOUTH DAILY.  Marland Kitchen losartan-hydrochlorothiazide (HYZAAR) 100-25 MG per tablet Take 1 tablet by mouth daily.  . mometasone (ELOCON) 0.1 % cream APPLY TO AFFECTED AREA DAILY  . moxifloxacin (VIGAMOX) 0.5 % ophthalmic solution Place 1 drop into the right eye See admin instructions. Pt takes three times daily for 2 days after eye injection  . multivitamin-lutein (OCUVITE-LUTEIN) CAPS capsule Take 1 capsule by mouth daily.  . ondansetron (ZOFRAN) 8 MG tablet TAKE 1 TABLET EVERY 8 HOURS AS NEEDED FOR NAUSEA  . PATADAY 0.2 % SOLN Place 1 drop into both eyes daily.  Marland Kitchen PROAIR HFA 108 (90  BASE) MCG/ACT inhaler INHALE 2 PUFFS INTO THE LUNGS EVERY 6 (SIX) HOURS AS NEEDED FOR WHEEZING.  . simvastatin (ZOCOR) 40 MG tablet Take 1 tablet (40 mg total) by mouth every evening.  . traMADol (ULTRAM) 50 MG tablet TAKE 1 TABLET BY MOUTH EVERY 6 HOURS AS NEEDED  . trimethoprim (TRIMPEX) 100 MG tablet Take 100 mg by mouth daily.  . vitamin C (ASCORBIC ACID) 500 MG tablet Take 500 mg by mouth daily.  . vitamin E 400 UNIT capsule  Take 400 Units by mouth daily.  . [DISCONTINUED] ranitidine (ZANTAC) 150 MG capsule   . [DISCONTINUED] cetirizine (ZYRTEC) 10 MG tablet TAKE 1 TABLET BY MOUTH EVERY MORNING  . [DISCONTINUED] polyethylene glycol powder (GLYCOLAX/MIRALAX) powder Take 17 g by mouth 2 (two) times daily as needed.     Review of Systems  Constitutional: Negative for fever, chills, diaphoresis, appetite change, fatigue and unexpected weight change.  HENT: Negative for congestion, ear pain, hearing loss, postnasal drip, rhinorrhea, sneezing, sore throat and trouble swallowing.   Eyes: Negative for pain.  Respiratory: Negative for cough, chest tightness and shortness of breath.   Cardiovascular: Negative for chest pain and palpitations.  Gastrointestinal: Negative for nausea, vomiting, abdominal pain, diarrhea and constipation.  Genitourinary: Negative for dysuria, frequency and menstrual problem.  Musculoskeletal: Negative for joint swelling and arthralgias.  Skin: Negative for rash.  Neurological: Negative for dizziness, weakness, numbness and headaches.  Psychiatric/Behavioral: Negative for dysphoric mood and agitation.       Objective:   Physical Exam  Constitutional: She is oriented to person, place, and time. She appears well-developed and well-nourished. No distress.  HENT:  Head: Normocephalic and atraumatic.  Right Ear: External ear normal.  Left Ear: External ear normal.  Nose: Nose normal.  Mouth/Throat: Oropharynx is clear and moist.  Eyes: Conjunctivae and EOM are normal. Pupils are equal, round, and reactive to light.  Neck: Normal range of motion. Neck supple. No thyromegaly present.  Cardiovascular: Normal rate, regular rhythm and normal heart sounds.   No murmur heard. Pulmonary/Chest: Effort normal and breath sounds normal. No respiratory distress. She has no wheezes. She has no rales.  Abdominal: Soft. Bowel sounds are normal. She exhibits no distension. There is no tenderness.    Lymphadenopathy:    She has no cervical adenopathy.  Neurological: She is alert and oriented to person, place, and time. She has normal reflexes.  Skin: Skin is warm and dry.  Psychiatric: She has a normal mood and affect. Her behavior is normal. Judgment and thought content normal.    BP 167/74 mmHg  Pulse 74  Temp(Src) 97.5 F (36.4 C) (Oral)  Ht $R'5\' 8"'aQ$  (1.727 m)  Wt 245 lb (111.131 kg)  BMI 37.26 kg/m2       Assessment & Plan:   1. Essential hypertension   2. Gastroesophageal reflux disease, esophagitis presence not specified   3. Hypothyroidism, unspecified hypothyroidism type   4. Hyperlipidemia   5. Arthritis     Meds ordered this encounter  Medications  . meloxicam (MOBIC) 15 MG tablet    Sig: Take 1 tablet (15 mg total) by mouth daily.    Dispense:  30 tablet    Refill:  0    Orders Placed This Encounter  Procedures  . CMP14+EGFR  . TSH  . T4, Free  . Sedimentation rate  . POCT CBC    Labs pending Health Maintenance Diet and exercise encouraged Continue all meds as discussed Follow up in 3 months  Cletus Gash  Livia Snellen, MD

## 2014-05-17 ENCOUNTER — Encounter: Payer: Self-pay | Admitting: Family Medicine

## 2014-05-17 LAB — CMP14+EGFR
ALT: 25 IU/L (ref 0–32)
AST: 27 IU/L (ref 0–40)
Albumin/Globulin Ratio: 1.3 (ref 1.1–2.5)
Albumin: 4.1 g/dL (ref 3.5–4.8)
Alkaline Phosphatase: 89 IU/L (ref 39–117)
BUN/Creatinine Ratio: 12 (ref 11–26)
BUN: 13 mg/dL (ref 8–27)
CO2: 26 mmol/L (ref 18–29)
Calcium: 9.9 mg/dL (ref 8.7–10.3)
Chloride: 98 mmol/L (ref 97–108)
Creatinine, Ser: 1.1 mg/dL — ABNORMAL HIGH (ref 0.57–1.00)
GFR calc Af Amer: 58 mL/min/{1.73_m2} — ABNORMAL LOW (ref >59)
GFR calc non Af Amer: 50 mL/min/{1.73_m2} — ABNORMAL LOW (ref >59)
Globulin, Total: 3.2 g/dL (ref 1.5–4.5)
Glucose: 120 mg/dL — ABNORMAL HIGH (ref 65–99)
Potassium: 4.5 mmol/L (ref 3.5–5.2)
Sodium: 142 mmol/L (ref 134–144)
Total Bilirubin: 0.3 mg/dL (ref 0.0–1.2)
Total Protein: 7.3 g/dL (ref 6.0–8.5)

## 2014-05-17 LAB — T4, FREE: Free T4: 0.9 ng/dL (ref 0.82–1.77)

## 2014-05-17 LAB — SEDIMENTATION RATE: Sed Rate: 20 mm/hr (ref 0–40)

## 2014-05-17 LAB — TSH: TSH: 5.6 u[IU]/mL — ABNORMAL HIGH (ref 0.450–4.500)

## 2014-05-22 ENCOUNTER — Telehealth: Payer: Self-pay | Admitting: Family Medicine

## 2014-05-22 NOTE — Telephone Encounter (Signed)
-----   Message from Claretta Fraise, MD sent at 05/17/2014  3:05 PM EST ----- Labs good except her thyroid is underactive. She needs a higher dose of levothyroxine. Follow up in 2 months. Drop by for labs 2 days before appt. (TSH, Free T4)

## 2014-05-23 MED ORDER — LEVOTHYROXINE SODIUM 88 MCG PO TABS: 88.0000 ug | ORAL_TABLET | Freq: Every day | ORAL | Status: DC

## 2014-05-23 NOTE — Telephone Encounter (Signed)
Higher dose sent to pharmacy. Please notify pt.

## 2014-05-23 NOTE — Telephone Encounter (Signed)
-----   Message from Claretta Fraise, MD sent at 05/17/2014  3:05 PM EST ----- Labs good except her thyroid is underactive. She needs a higher dose of levothyroxine. Follow up in 2 months. Drop by for labs 2 days before appt. (TSH, Free T4)

## 2014-05-23 NOTE — Telephone Encounter (Signed)
Medication change 

## 2014-05-24 DIAGNOSIS — L509 Urticaria, unspecified: Secondary | ICD-10-CM | POA: Diagnosis not present

## 2014-05-24 DIAGNOSIS — Z7952 Long term (current) use of systemic steroids: Secondary | ICD-10-CM | POA: Diagnosis not present

## 2014-05-24 DIAGNOSIS — M069 Rheumatoid arthritis, unspecified: Secondary | ICD-10-CM | POA: Diagnosis not present

## 2014-05-24 DIAGNOSIS — L508 Other urticaria: Secondary | ICD-10-CM | POA: Diagnosis not present

## 2014-05-24 DIAGNOSIS — J309 Allergic rhinitis, unspecified: Secondary | ICD-10-CM | POA: Diagnosis not present

## 2014-05-24 DIAGNOSIS — H3530 Unspecified macular degeneration: Secondary | ICD-10-CM | POA: Diagnosis not present

## 2014-05-26 ENCOUNTER — Telehealth: Payer: Self-pay | Admitting: Family Medicine

## 2014-05-26 MED ORDER — CELECOXIB 200 MG PO CAPS: 200.0000 mg | ORAL_CAPSULE | Freq: Every day | ORAL | Status: DC

## 2014-05-26 NOTE — Telephone Encounter (Signed)
Stp she states the meloxicam caused her to itch really bad so she stopped and the itching is gone. She wants to know if there is anything else she can take because the Meloxicam really helped with her arthritis. 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 Please advise.

## 2014-05-26 NOTE — Telephone Encounter (Signed)
I will send in a prescription for celebrex. Please tell pt.

## 2014-05-29 NOTE — Telephone Encounter (Signed)
Left detailed message on voicemail that rx was at pharmacy and ready to pick up

## 2014-06-07 ENCOUNTER — Encounter (INDEPENDENT_AMBULATORY_CARE_PROVIDER_SITE_OTHER): Payer: Medicare Other | Admitting: Ophthalmology

## 2014-06-07 DIAGNOSIS — H43813 Vitreous degeneration, bilateral: Secondary | ICD-10-CM

## 2014-06-07 DIAGNOSIS — H35033 Hypertensive retinopathy, bilateral: Secondary | ICD-10-CM

## 2014-06-07 DIAGNOSIS — E11329 Type 2 diabetes mellitus with mild nonproliferative diabetic retinopathy without macular edema: Secondary | ICD-10-CM | POA: Diagnosis not present

## 2014-06-07 DIAGNOSIS — H3532 Exudative age-related macular degeneration: Secondary | ICD-10-CM

## 2014-06-07 DIAGNOSIS — E11319 Type 2 diabetes mellitus with unspecified diabetic retinopathy without macular edema: Secondary | ICD-10-CM | POA: Diagnosis not present

## 2014-06-07 DIAGNOSIS — I10 Essential (primary) hypertension: Secondary | ICD-10-CM

## 2014-06-07 DIAGNOSIS — H2513 Age-related nuclear cataract, bilateral: Secondary | ICD-10-CM | POA: Diagnosis not present

## 2014-06-08 DIAGNOSIS — N8111 Cystocele, midline: Secondary | ICD-10-CM | POA: Diagnosis not present

## 2014-06-08 DIAGNOSIS — N3020 Other chronic cystitis without hematuria: Secondary | ICD-10-CM | POA: Diagnosis not present

## 2014-06-12 ENCOUNTER — Telehealth: Payer: Self-pay | Admitting: Family Medicine

## 2014-06-12 NOTE — Telephone Encounter (Signed)
Pt notified that she does need yearly mammogram Verbalizes understanding

## 2014-06-17 ENCOUNTER — Other Ambulatory Visit: Payer: Self-pay | Admitting: Family Medicine

## 2014-06-19 NOTE — Telephone Encounter (Signed)
Last seen and filled on 05/16/14. [facility]

## 2014-06-20 ENCOUNTER — Telehealth: Payer: Self-pay | Admitting: Family Medicine

## 2014-06-20 ENCOUNTER — Ambulatory Visit (INDEPENDENT_AMBULATORY_CARE_PROVIDER_SITE_OTHER): Payer: Medicare Other | Admitting: Nurse Practitioner

## 2014-06-20 ENCOUNTER — Encounter: Payer: Self-pay | Admitting: Nurse Practitioner

## 2014-06-20 VITALS — BP 152/71 | HR 75 | Temp 97.6°F | Ht 68.0 in | Wt 247.0 lb

## 2014-06-20 DIAGNOSIS — L509 Urticaria, unspecified: Secondary | ICD-10-CM | POA: Diagnosis not present

## 2014-06-20 DIAGNOSIS — R21 Rash and other nonspecific skin eruption: Secondary | ICD-10-CM | POA: Diagnosis not present

## 2014-06-20 MED ORDER — METHYLPREDNISOLONE ACETATE 80 MG/ML IJ SUSP
80.0000 mg | Freq: Once | INTRAMUSCULAR | Status: AC
  Administered 2014-06-20: 80 mg via INTRAMUSCULAR

## 2014-06-20 MED ORDER — HYDROXYZINE HCL 10 MG PO TABS: 10.0000 mg | ORAL_TABLET | Freq: Four times a day (QID) | ORAL | Status: DC | PRN

## 2014-06-20 MED ORDER — PREDNISONE 20 MG PO TABS: 20.0000 mg | ORAL_TABLET | Freq: Every day | ORAL | Status: DC

## 2014-06-20 NOTE — Telephone Encounter (Signed)
Appointment given for today with Lorraine Leonard

## 2014-06-20 NOTE — Patient Instructions (Signed)
Hives Hives are itchy, red, swollen areas of the skin. They can vary in size and location on your body. Hives can come and go for hours or several days (acute hives) or for several weeks (chronic hives). Hives do not spread from person to person (noncontagious). They may get worse with scratching, exercise, and emotional stress. CAUSES   Allergic reaction to food, additives, or drugs.  Infections, including the common cold.  Illness, such as vasculitis, lupus, or thyroid disease.  Exposure to sunlight, heat, or cold.  Exercise.  Stress.  Contact with chemicals. SYMPTOMS   Red or white swollen patches on the skin. The patches may change size, shape, and location quickly and repeatedly.  Itching.  Swelling of the hands, feet, and face. This may occur if hives develop deeper in the skin. DIAGNOSIS  Your caregiver can usually tell what is wrong by performing a physical exam. Skin or blood tests may also be done to determine the cause of your hives. In some cases, the cause cannot be determined. TREATMENT  Mild cases usually get better with medicines such as antihistamines. Severe cases may require an emergency epinephrine injection. If the cause of your hives is known, treatment includes avoiding that trigger.  HOME CARE INSTRUCTIONS   Avoid causes that trigger your hives.  Take antihistamines as directed by your caregiver to reduce the severity of your hives. Non-sedating or low-sedating antihistamines are usually recommended. Do not drive while taking an antihistamine.  Take any other medicines prescribed for itching as directed by your caregiver.  Wear loose-fitting clothing.  Keep all follow-up appointments as directed by your caregiver. SEEK MEDICAL CARE IF:   You have persistent or severe itching that is not relieved with medicine.  You have painful or swollen joints. SEEK IMMEDIATE MEDICAL CARE IF:   You have a fever.  Your tongue or lips are swollen.  You have  trouble breathing or swallowing.  You feel tightness in the throat or chest.  You have abdominal pain. These problems may be the first sign of a life-threatening allergic reaction. Call your local emergency services (911 in [place] MAKE SURE YOU:   Understand these instructions.  Will watch your condition.  Will get help right away if you are not doing well or get worse. Document Released: 04/07/2005 Document Revised: 04/12/2013 Document Reviewed: 07/01/2011 Exit[employer] Patient Information 2015 [employer]. This information is not intended to replace advice given to you by your health care provider. Make sure you discuss any questions you have with your health care provider.  

## 2014-06-20 NOTE — Progress Notes (Signed)
  Subjective:     Lorraine Leonard is a 74 y.o. female who complains of a rash. Symptoms began 3 days ago. Patient describes the rash as erythematous, red, diffuse, maculopapular. Characteristics of rash and associated history: Similar rash in the past? yes, Is rash pruritic?  yes, Is rash painful?  yes, Alleviating factors? Yes, cold shower.  Any associated:fever, chills, Skin exposure to potential irritants at home or work or from [employer], Personal hx of allergies, asthma or hay fever? no, Recent hiking?no, Recent travel? no, Recent exposure to animals? no. Patient's previous dermatologic history includes hives.  Medications currently using: benadryl and xyzal. Environmental exposures or allergies: none  The following portions of the patient's history were reviewed and updated as appropriate: allergies, current medications, past family history, past medical history, past social history, past surgical history and problem list.   Review of Systems Pertinent items are noted in HPI.    Objective:    BP 152/71 mmHg  Pulse 75  Temp(Src) 97.6 F (36.4 C) (Oral)  Ht 5\' 8"  (1.727 m)  Wt 247 lb (112.038 kg)  BMI 37.56 kg/m2 Physical Exam  General:  alert and cooperative  HEENT:  PERRLA  Lymph Nodes:  Cervical, supraclavicular, and axillary nodes normal.  Lungs:  clear to auscultation bilaterally  Heart:  regular rate and rhythm, S1, S2 normal, no murmur, click, rub or gallop  Extremities:  extremities normal, atraumatic, no cyanosis or edema  Skin:   Rash located on the buttocks, extremities lower. Warm to touch.    Shape:   flat   Consistency:   dermal  Color:   hyperpigmented, red  Type: macularpapular rash        The remainder of the skin exam:  wnl  Laboratory: none at this time.        Assessment:    hives    Plan:    1.  benadryl, hydrocortisone and steroids: depomedrol 2.  Written patient instruction given. 3. Follow up as needed for acute illness    Orders Placed This  Encounter  Procedures  . Alpha-Gal Panel   Meds ordered this encounter  Medications  . hydrOXYzine (ATARAX/VISTARIL) 10 MG tablet    Sig: Take 1 tablet (10 mg total) by mouth every 6 (six) hours as needed for itching.    Dispense:  30 tablet    Refill:  1  . methylPREDNISolone acetate (DEPO-MEDROL) injection 80 mg    Sig:   . predniSONE (DELTASONE) 20 MG tablet    Sig: Take 1 tablet (20 mg total) by mouth daily with breakfast. 2 po at sametime daily for 5 days- start tomorrow    Dispense:  10 tablet    Refill:  0    Order Specific Question:  Supervising Provider    Answer:  Chipper Herb [[id number]]   [place], FNP

## 2014-06-21 ENCOUNTER — Telehealth: Payer: Self-pay | Admitting: Family Medicine

## 2014-06-21 NOTE — Telephone Encounter (Signed)
Please review and advise.

## 2014-06-22 ENCOUNTER — Telehealth: Payer: Self-pay | Admitting: Nurse Practitioner

## 2014-06-22 NOTE — Telephone Encounter (Signed)
[facility] Pt notified

## 2014-06-22 NOTE — Telephone Encounter (Signed)
[facility] Pt notified 

## 2014-06-22 NOTE — Telephone Encounter (Signed)
Patient took 2- 20mg  prenisone at 12:00pm yesterday at lunch and then took 2-20mg  last night she had forgot that she had taken then. When should she take her next dose.

## 2014-06-24 LAB — ALPHA-GAL PANEL
Alpha Gal IgE*: <0.1 kU/L (ref <0.35)
Beef (Bos spp) IgE: <0.1 kU/L (ref <0.35)
Class Interpretation: 0
Class Interpretation: 0
Class Interpretation: 0
Lamb/Mutton (Ovis spp) IgE: <0.1 kU/L (ref <0.35)
Pork (Sus spp) IgE: <0.1 kU/L (ref <0.35)

## 2014-06-26 ENCOUNTER — Encounter: Payer: Self-pay | Admitting: Nurse Practitioner

## 2014-06-28 DIAGNOSIS — L509 Urticaria, unspecified: Secondary | ICD-10-CM | POA: Diagnosis not present

## 2014-06-28 DIAGNOSIS — L508 Other urticaria: Secondary | ICD-10-CM | POA: Diagnosis not present

## 2014-07-01 ENCOUNTER — Other Ambulatory Visit: Payer: Self-pay | Admitting: Family Medicine

## 2014-07-05 ENCOUNTER — Encounter (INDEPENDENT_AMBULATORY_CARE_PROVIDER_SITE_OTHER): Payer: Medicare Other | Admitting: Ophthalmology

## 2014-07-05 DIAGNOSIS — H3532 Exudative age-related macular degeneration: Secondary | ICD-10-CM

## 2014-07-05 DIAGNOSIS — E11319 Type 2 diabetes mellitus with unspecified diabetic retinopathy without macular edema: Secondary | ICD-10-CM | POA: Diagnosis not present

## 2014-07-05 DIAGNOSIS — E11329 Type 2 diabetes mellitus with mild nonproliferative diabetic retinopathy without macular edema: Secondary | ICD-10-CM | POA: Diagnosis not present

## 2014-07-05 DIAGNOSIS — I10 Essential (primary) hypertension: Secondary | ICD-10-CM

## 2014-07-05 DIAGNOSIS — H35033 Hypertensive retinopathy, bilateral: Secondary | ICD-10-CM

## 2014-07-05 DIAGNOSIS — H43813 Vitreous degeneration, bilateral: Secondary | ICD-10-CM

## 2014-07-06 DIAGNOSIS — E11319 Type 2 diabetes mellitus with unspecified diabetic retinopathy without macular edema: Secondary | ICD-10-CM | POA: Diagnosis not present

## 2014-07-06 DIAGNOSIS — H35033 Hypertensive retinopathy, bilateral: Secondary | ICD-10-CM | POA: Diagnosis not present

## 2014-07-06 DIAGNOSIS — H3532 Exudative age-related macular degeneration: Secondary | ICD-10-CM | POA: Diagnosis not present

## 2014-07-06 DIAGNOSIS — H2512 Age-related nuclear cataract, left eye: Secondary | ICD-10-CM | POA: Diagnosis not present

## 2014-07-06 DIAGNOSIS — H25012 Cortical age-related cataract, left eye: Secondary | ICD-10-CM | POA: Diagnosis not present

## 2014-07-15 ENCOUNTER — Other Ambulatory Visit: Payer: Self-pay | Admitting: Family Medicine

## 2014-07-19 ENCOUNTER — Ambulatory Visit: Payer: Medicare Other | Admitting: Family

## 2014-07-20 DIAGNOSIS — Z1231 Encounter for screening mammogram for malignant neoplasm of breast: Secondary | ICD-10-CM | POA: Diagnosis not present

## 2014-08-03 ENCOUNTER — Ambulatory Visit (INDEPENDENT_AMBULATORY_CARE_PROVIDER_SITE_OTHER): Payer: Medicare Other | Admitting: Nurse Practitioner

## 2014-08-03 ENCOUNTER — Encounter: Payer: Self-pay | Admitting: Nurse Practitioner

## 2014-08-03 ENCOUNTER — Telehealth: Payer: Self-pay | Admitting: Nurse Practitioner

## 2014-08-03 VITALS — BP 143/65 | HR 63 | Temp 96.9°F | Ht 68.0 in | Wt 239.0 lb

## 2014-08-03 DIAGNOSIS — D72829 Elevated white blood cell count, unspecified: Secondary | ICD-10-CM

## 2014-08-03 DIAGNOSIS — R1013 Epigastric pain: Secondary | ICD-10-CM

## 2014-08-03 LAB — POCT CBC
Granulocyte percent: 64.2 %G (ref 37–80)
HCT, POC: 42.4 % (ref 37.7–47.9)
Hemoglobin: 13.3 g/dL (ref 12.2–16.2)
Lymph, poc: 4 — AB (ref 0.6–3.4)
MCH, POC: 27.2 pg (ref 27–31.2)
MCHC: 31.4 g/dL — AB (ref 31.8–35.4)
MCV: 86.7 fL (ref 80–97)
MPV: 8.7 fL (ref 0–99.8)
POC Granulocyte: 7.6 — AB (ref 2–6.9)
POC LYMPH PERCENT: 33.8 %L (ref 10–50)
Platelet Count, POC: 278 10*3/uL (ref 142–424)
RBC: 4.89 M/uL (ref 4.04–5.48)
RDW, POC: 14.5 %
WBC: 11.9 10*3/uL — AB (ref 4.6–10.2)

## 2014-08-03 MED ORDER — CIPROFLOXACIN HCL 500 MG PO TABS: 500.0000 mg | ORAL_TABLET | Freq: Two times a day (BID) | ORAL | Status: DC

## 2014-08-03 NOTE — Progress Notes (Signed)
   Subjective:    Patient ID: Lorraine Leonard, female    DOB: 1940-06-17, 74 y.o.   MRN: [id number]  HPI Patient in today c/o nausea that started Monday. She denies in vomiting- Has taken zofran and that has not helped. Laying down increases feeling of nausea. Soreness across upper abdomen. Had a bowel movement yesterday and today- has been constipated. Denies fever. Lamonte Sakai snot been able to eat today except a piece of bread and a few ginger snaps- Keeping gingerale down. She is on nexium.    Review of Systems  Constitutional: Negative.   HENT: Negative.   Respiratory: Negative.   Cardiovascular: Negative.   Gastrointestinal: Positive for nausea, abdominal pain and constipation. Negative for vomiting and rectal pain.  Genitourinary: Negative.   Neurological: Negative.   Psychiatric/Behavioral: Negative.   All other systems reviewed and are negative.      Objective:   Physical Exam  Constitutional: She is oriented to person, place, and time. She appears well-developed and well-nourished.  Neck: Normal range of motion. Neck supple.  Cardiovascular: Normal rate, regular rhythm and normal heart sounds.   Pulmonary/Chest: Effort normal and breath sounds normal.  Abdominal: Soft. Bowel sounds are normal. She exhibits no distension. There is tenderness (midepigastric and right upper quadrant pain on palpation.).  Neurological: She is alert and oriented to person, place, and time.  Skin: Skin is warm.  Psychiatric: She has a normal mood and affect. Her behavior is normal. Judgment and thought content normal.    BP 143/65 mmHg  Pulse 63  Temp(Src) 96.9 F (36.1 C) (Oral)  Ht 5\' 8"  (1.727 m)  Wt 239 lb (108.41 kg)  BMI 36.35 kg/m2  WBC 11.5     Assessment & Plan:   1. Epigastric pain   2. Elevated WBC count    Meds ordered this encounter  Medications  . ciprofloxacin (CIPRO) 500 MG tablet    Sig: Take 1 tablet (500 mg total) by mouth 2 (two) times daily.    Dispense:  20 tablet    Refill:  0    Order Specific Question:  Supervising Provider    Answer:  Chipper Herb [[id number]]   Orders Placed This Encounter  Procedures  . US Abdomen Limited RUQ    Standing Status: Future     Number of Occurrences:      Standing Expiration Date: 10/03/2015    Order Specific Question:  Reason for Exam (SYMPTOM  OR DIAGNOSIS REQUIRED)    Answer:  epigastric pain    Order Specific Question:  Preferred imaging location?    Answer:  [facility]  . POCT CBC   First 24 Hours-Clear liquids  popsicles  Jello  gatorade  Sprite Second 24 hours-Add Full liquids ( Liquids you cant see through) Third 24 hours- Bland diet ( foods that are baked or broiled)  *avoiding fried foods and highly spiced foods* During these 3 days  Avoid milk, cheese, ice cream or any other dairy products  Avoid caffeine- REMEMBER Mt. Dew and Mello Yellow contain lots of caffeine You should eat and drink in  Frequent small volumes If no improvement in symptoms or worsen in 2-3 days should RETRUN TO OFFICE or go to ER!    Mary-Margaret Hassell Done, FNP

## 2014-08-03 NOTE — Telephone Encounter (Signed)
Appointment given for 5:15 with Ronnald Collum, FNP

## 2014-08-03 NOTE — Patient Instructions (Signed)
First 24 Hours-Clear liquids  popsicles  Jello  gatorade  Sprite Second 24 hours-Add Full liquids ( Liquids you cant see through) Third 24 hours- Bland diet ( foods that are baked or broiled)  *avoiding fried foods and highly spiced foods* During these 3 days  Avoid milk, cheese, ice cream or any other dairy products  Avoid caffeine- REMEMBER Mt. Dew and Mello Yellow contain lots of caffeine You should eat and drink in  Frequent small volumes If no improvement in symptoms or worsen in 2-3 days should RETRUN TO OFFICE or go to ER!     

## 2014-08-07 ENCOUNTER — Telehealth: Payer: Self-pay | Admitting: Nurse Practitioner

## 2014-08-07 MED ORDER — POLYETHYLENE GLYCOL 3350 17 GM/SCOOP PO POWD: 17.0000 g | Freq: Two times a day (BID) | ORAL | Status: DC | PRN

## 2014-08-07 NOTE — Telephone Encounter (Signed)
miralax rx snet  To pharmacy

## 2014-08-07 NOTE — Telephone Encounter (Signed)
Pt aware of refill sent to pharmacy

## 2014-08-08 DIAGNOSIS — H2512 Age-related nuclear cataract, left eye: Secondary | ICD-10-CM | POA: Diagnosis not present

## 2014-08-14 ENCOUNTER — Ambulatory Visit (HOSPITAL_COMMUNITY)
Admission: RE | Admit: 2014-08-14 | Discharge: 2014-08-14 | Disposition: A | Discharge status: Home / Self Care | Payer: Medicare Other | Source: Ambulatory Visit | Attending: Nurse Practitioner | Admitting: Nurse Practitioner

## 2014-08-14 DIAGNOSIS — K760 Fatty (change of) liver, not elsewhere classified: Secondary | ICD-10-CM | POA: Insufficient documentation

## 2014-08-14 DIAGNOSIS — R1013 Epigastric pain: Secondary | ICD-10-CM

## 2014-08-14 DIAGNOSIS — R110 Nausea: Secondary | ICD-10-CM | POA: Insufficient documentation

## 2014-08-16 ENCOUNTER — Encounter (INDEPENDENT_AMBULATORY_CARE_PROVIDER_SITE_OTHER): Payer: Medicare Other | Admitting: Ophthalmology

## 2014-08-16 DIAGNOSIS — E11319 Type 2 diabetes mellitus with unspecified diabetic retinopathy without macular edema: Secondary | ICD-10-CM

## 2014-08-16 DIAGNOSIS — H43813 Vitreous degeneration, bilateral: Secondary | ICD-10-CM | POA: Diagnosis not present

## 2014-08-16 DIAGNOSIS — E11329 Type 2 diabetes mellitus with mild nonproliferative diabetic retinopathy without macular edema: Secondary | ICD-10-CM | POA: Diagnosis not present

## 2014-08-16 DIAGNOSIS — H2511 Age-related nuclear cataract, right eye: Secondary | ICD-10-CM | POA: Diagnosis not present

## 2014-08-16 DIAGNOSIS — H3532 Exudative age-related macular degeneration: Secondary | ICD-10-CM | POA: Diagnosis not present

## 2014-08-17 ENCOUNTER — Ambulatory Visit (INDEPENDENT_AMBULATORY_CARE_PROVIDER_SITE_OTHER): Payer: Medicare Other

## 2014-08-17 ENCOUNTER — Ambulatory Visit (INDEPENDENT_AMBULATORY_CARE_PROVIDER_SITE_OTHER): Payer: Medicare Other | Admitting: Family Medicine

## 2014-08-17 ENCOUNTER — Encounter: Payer: Self-pay | Admitting: Family Medicine

## 2014-08-17 VITALS — BP 154/69 | HR 67 | Temp 96.8°F | Ht 68.0 in | Wt 238.6 lb

## 2014-08-17 DIAGNOSIS — M129 Arthropathy, unspecified: Secondary | ICD-10-CM

## 2014-08-17 DIAGNOSIS — E785 Hyperlipidemia, unspecified: Secondary | ICD-10-CM

## 2014-08-17 DIAGNOSIS — I10 Essential (primary) hypertension: Secondary | ICD-10-CM

## 2014-08-17 DIAGNOSIS — E1169 Type 2 diabetes mellitus with other specified complication: Secondary | ICD-10-CM | POA: Insufficient documentation

## 2014-08-17 DIAGNOSIS — Z780 Asymptomatic menopausal state: Secondary | ICD-10-CM

## 2014-08-17 DIAGNOSIS — M1710 Unilateral primary osteoarthritis, unspecified knee: Secondary | ICD-10-CM

## 2014-08-17 DIAGNOSIS — E559 Vitamin D deficiency, unspecified: Secondary | ICD-10-CM

## 2014-08-17 DIAGNOSIS — E038 Other specified hypothyroidism: Secondary | ICD-10-CM

## 2014-08-17 LAB — POCT CBC
Granulocyte percent: 65.8 %G (ref 37–80)
HCT, POC: 42.9 % (ref 37.7–47.9)
Hemoglobin: 13.6 g/dL (ref 12.2–16.2)
Lymph, poc: 3 (ref 0.6–3.4)
MCH, POC: 27.2 pg (ref 27–31.2)
MCHC: 31.7 g/dL — AB (ref 31.8–35.4)
MCV: 85.9 fL (ref 80–97)
MPV: 8.7 fL (ref 0–99.8)
POC Granulocyte: 6.4 (ref 2–6.9)
POC LYMPH PERCENT: 31.3 %L (ref 10–50)
Platelet Count, POC: 300 10*3/uL (ref 142–424)
RBC: 4.99 M/uL (ref 4.04–5.48)
RDW, POC: 14.6 %
WBC: 9.7 10*3/uL (ref 4.6–10.2)

## 2014-08-17 MED ORDER — VALSARTAN-HYDROCHLOROTHIAZIDE 320-12.5 MG PO TABS: 1.0000 | ORAL_TABLET | Freq: Every day | ORAL | Status: DC

## 2014-08-17 NOTE — Progress Notes (Signed)
Subjective:  Patient ID: Lorraine Leonard, female    DOB: 1940/06/11  Age: 74 y.o. MRN: [id number]  CC: Hyperlipidemia; Hypertension; and Hypothyroidism   HPI Lorraine Leonard presents for Patient presents for follow-up on  thyroid. She has a history of hypothyroidism for many years. It has been stable recently. Pt. denies any change in  voice, loss of hair, heat or cold intolerance. Energy level has been adequate to good. She denies constipation and diarrhea. No myxedema. Medication is as noted below. Verified that pt is taking it daily on an empty stomach. Well tolerated.   follow-up of hypertension. Patient has no history of headache chest pain or shortness of breath or recent cough. Patient also denies symptoms of TIA such as numbness weakness lateralizing. Patient checks  blood pressure at home and has not had any elevated readings recently. Patient denies side effects from his medication. States taking it regularly.   Patient in for follow-up of elevated cholesterol. Doing well without complaints on current medication. Denies side effects of statin including myalgia and arthralgia and nausea. Also in today for liver function testing. Currently no chest pain, shortness of breath or other cardiovascular related symptoms noted.  Patient has had increasing pain in the knee from her arthritis. Right knee primarily, joint line anteriorly. History Lorraine Leonard has a past medical history of Hypertension; Diabetes mellitus; Allergic rhinitis; Urticaria; GERD (gastroesophageal reflux disease); Lung fibrosis; Diverticulosis; Macular degeneration; Depression; Hypothyroidism; Anxiety; Anxiety; Rheumatoid arthritis(714.0); and Hyperlipidemia.   She has past surgical history that includes Lumbar disc surgery; Kidney surgery; Vaginal hysterectomy; and Tubal ligation.   Her family history includes Anxiety disorder in an other family member; Asthma in an other family member; Coronary artery disease in an other family  member; Diabetes in an other family member; Hyperlipidemia in an other family member; Hypertension in an other family member; Kidney disease in her sister; Lung cancer in her sister and sister; Migraines in an other family member; Stroke in an other family member; Suicidality in her son.She reports that she has never smoked. She has never used smokeless tobacco. She reports that she does not drink alcohol or use illicit drugs.  Current Outpatient Prescriptions on File Prior to Visit  Medication Sig Dispense Refill  . aspirin 81 MG tablet Take 81 mg by mouth daily.     Marland Kitchen azelastine (ASTELIN) 0.1 % nasal spray PLACE 2 SPRAYS INTO BOTH NOSTRILS 2 (TWO) TIMES DAILY AS NEEDED. 30 mL 5  . calcium citrate (CALCITRATE - DOSED IN MG ELEMENTAL CALCIUM) 950 MG tablet Take 1 tablet by mouth daily.    . Cholecalciferol (VITAMIN D) 2000 UNITS CAPS Take 1 capsule by mouth daily.    Marland Kitchen conjugated estrogens (PREMARIN) vaginal cream Place 0.5 g vaginally daily. Use as directed    . cyanocobalamin 500 MCG tablet Take 500 mcg by mouth daily.    . diazepam (VALIUM) 5 MG tablet TAKE 1 TABLET 2 TIMES A DAY AS NEEDED FOR ANXIETY 30 tablet 1  . diclofenac sodium (VOLTAREN) 1 % GEL Apply 2 g topically 4 (four) times daily. 100 g 3  . esomeprazole (NEXIUM) 40 MG capsule TAKE 1 CAPSULE (40 MG TOTAL) BY MOUTH DAILY BEFORE BREAKFAST. 30 capsule 3  . hydrOXYzine (ATARAX/VISTARIL) 10 MG tablet Take 1 tablet (10 mg total) by mouth every 6 (six) hours as needed for itching. 30 tablet 1  . levocetirizine (XYZAL) 5 MG tablet     . levothyroxine (SYNTHROID, LEVOTHROID) 88 MCG tablet Take 1 tablet (  88 mcg total) by mouth daily before breakfast. 30 tablet 5  . meloxicam (MOBIC) 15 MG tablet Take 1 tablet (15 mg total) by mouth daily. 30 tablet 0  . mometasone (ELOCON) 0.1 % cream APPLY TO AFFECTED AREA DAILY 45 g 1  . moxifloxacin (VIGAMOX) 0.5 % ophthalmic solution Place 1 drop into the right eye See admin instructions. Pt takes three  times daily for 2 days after eye injection    . multivitamin-lutein (OCUVITE-LUTEIN) CAPS capsule Take 1 capsule by mouth daily.    . ondansetron (ZOFRAN) 8 MG tablet TAKE 1 TABLET EVERY 8 HOURS AS NEEDED FOR NAUSEA 20 tablet 1  . PATADAY 0.2 % SOLN Place 1 drop into both eyes daily.    . polyethylene glycol powder (GLYCOLAX/MIRALAX) powder Take 17 g by mouth 2 (two) times daily as needed. 3350 g 1  . PROAIR HFA 108 (90 BASE) MCG/ACT inhaler INHALE 2 PUFFS INTO THE LUNGS EVERY 6 (SIX) HOURS AS NEEDED FOR WHEEZING. 8.5 each 4  . simvastatin (ZOCOR) 40 MG tablet Take 1 tablet (40 mg total) by mouth every evening. 30 tablet 11  . traMADol (ULTRAM) 50 MG tablet TAKE 1 TABLET BY MOUTH EVERY 6 HOURS AS NEEDED 90 tablet 1  . trimethoprim (TRIMPEX) 100 MG tablet Take 100 mg by mouth daily.    . vitamin C (ASCORBIC ACID) 500 MG tablet Take 500 mg by mouth daily.    . vitamin E 400 UNIT capsule Take 400 Units by mouth daily.     Current Facility-Administered Medications on File Prior to Visit  Medication Dose Route Frequency Provider Last Rate Last Dose  . methylPREDNISolone acetate (DEPO-MEDROL) injection 80 mg  80 mg Intramuscular Once [name], FNP        ROS Review of Systems  Constitutional: Negative for fever, chills, diaphoresis, appetite change, fatigue and unexpected weight change.  HENT: Negative for congestion, ear pain, hearing loss, postnasal drip, rhinorrhea, sneezing, sore throat and trouble swallowing.   Eyes: Negative for pain.  Respiratory: Negative for cough, chest tightness and shortness of breath.   Cardiovascular: Negative for chest pain and palpitations.  Gastrointestinal: Negative for nausea, vomiting, abdominal pain, diarrhea and constipation.  Genitourinary: Negative for dysuria, frequency and menstrual problem.  Musculoskeletal: Negative for joint swelling and arthralgias.  Skin: Negative for rash.  Neurological: Negative for dizziness, weakness, numbness and  headaches.  Psychiatric/Behavioral: Negative for dysphoric mood and agitation.    Objective:  BP 154/69 mmHg  Pulse 67  Temp(Src) 96.8 F (36 C) (Oral)  Ht $R'5\' 8"'JY$  (1.727 m)  Wt 238 lb 9.6 oz (108.228 kg)  BMI 36.29 kg/m2  BP Readings from Last 3 Encounters:  08/17/14 154/69  08/03/14 143/65  06/20/14 152/71    Wt Readings from Last 3 Encounters:  08/17/14 238 lb 9.6 oz (108.228 kg)  08/03/14 239 lb (108.41 kg)  06/20/14 247 lb (112.038 kg)     Physical Exam  Constitutional: She is oriented to person, place, and time. She appears well-developed and well-nourished. No distress.  HENT:  Head: Normocephalic and atraumatic.  Right Ear: External ear normal.  Left Ear: External ear normal.  Nose: Nose normal.  Mouth/Throat: Oropharynx is clear and moist.  Eyes: Conjunctivae and EOM are normal. Pupils are equal, round, and reactive to light.  Neck: Normal range of motion. Neck supple. No thyromegaly present.  Cardiovascular: Normal rate, regular rhythm and normal heart sounds.   No murmur heard. Pulmonary/Chest: Effort normal and breath sounds normal. No  respiratory distress. She has no wheezes. She has no rales.  Abdominal: Soft. Bowel sounds are normal. She exhibits no distension. There is no tenderness.  Lymphadenopathy:    She has no cervical adenopathy.  Neurological: She is alert and oriented to person, place, and time. She has normal reflexes.  Skin: Skin is warm and dry.  Psychiatric: She has a normal mood and affect. Her behavior is normal. Judgment and thought content normal.    No results found for: HGBA1C  Lab Results  Component Value Date   WBC 9.7 08/17/2014   HGB 13.6 08/17/2014   HCT 42.9 08/17/2014   PLT 271 05/14/2011   GLUCOSE 120* 05/16/2014   CHOL 158 02/01/2014   TRIG 194* 02/01/2014   HDL 47 02/01/2014   LDLCALC 72 02/01/2014   ALT 25 05/16/2014   AST 27 05/16/2014   NA 142 05/16/2014   K 4.5 05/16/2014   CL 98 05/16/2014   CREATININE  1.10* 05/16/2014   BUN 13 05/16/2014   CO2 26 05/16/2014   TSH 5.600* 05/16/2014    US Abdomen Limited Ruq  08/14/2014   CLINICAL DATA:  Epigastric pain and nausea since 07/31/2014.  EXAM: US ABDOMEN LIMITED - RIGHT UPPER QUADRANT  COMPARISON:  None.  FINDINGS: Gallbladder:  No gallstones or wall thickening visualized. No sonographic Murphy sign noted.  Common bile duct:  Diameter: 0.4 cm  Liver:  Increased echogenicity consistent with fatty infiltration is identified. No focal lesion or intrahepatic biliary ductal dilatation.  IMPRESSION: No acute finding.  Negative for gallstones.  Fatty infiltration of the liver.   Electronically Signed   By: Inge Rise M.D.   On: 08/14/2014 09:30    Assessment & Plan:   Analese was seen today for hyperlipidemia, hypertension and hypothyroidism.  Diagnoses and all orders for this visit:  Other specified hypothyroidism Orders: -     Thyroid Panel With TSH  Vitamin D deficiency Orders: -     Vit D  25 hydroxy (rtn osteoporosis monitoring); Standing -     Vit D  25 hydroxy (rtn osteoporosis monitoring)  Postmenopausal Orders: -     Vit D  25 hydroxy (rtn osteoporosis monitoring); Standing -     Vit D  25 hydroxy (rtn osteoporosis monitoring) -     DG Bone Density  Hyperlipemia Orders: -     Lipid panel; Standing -     NMR, lipoprofile  Essential hypertension Orders: -     POCT CBC; Standing -     CMP14+EGFR; Standing -     POCT CBC -     CMP14+EGFR  Arthritis of knee  Other orders -     valsartan-hydrochlorothiazide (DIOVAN-HCT) 320-12.5 MG per tablet; Take 1 tablet by mouth daily.   I have discontinued Ms. Skluzacek's losartan-hydrochlorothiazide, predniSONE, and ciprofloxacin. I am also having her start on valsartan-hydrochlorothiazide. Additionally, I am having her maintain her aspirin, trimethoprim, moxifloxacin, conjugated estrogens, PATADAY, Vitamin D, calcium citrate, vitamin E, multivitamin-lutein, cyanocobalamin, vitamin C,  mometasone, simvastatin, levocetirizine, diclofenac sodium, PROAIR HFA, traMADol, esomeprazole, meloxicam, levothyroxine, diazepam, hydrOXYzine, ondansetron, azelastine, and polyethylene glycol powder. We will continue to administer methylPREDNISolone acetate.  Meds ordered this encounter  Medications  . valsartan-hydrochlorothiazide (DIOVAN-HCT) 320-12.5 MG per tablet    Sig: Take 1 tablet by mouth daily.    Dispense:  30 tablet    Refill:  5     Follow-up: Return in about 3 months (around 11/16/2014) for hypertension, cholesterol.  Claretta Fraise, M.D.

## 2014-08-18 ENCOUNTER — Other Ambulatory Visit: Payer: Self-pay | Admitting: Family Medicine

## 2014-08-18 LAB — NMR, LIPOPROFILE
Cholesterol: 155 mg/dL (ref 100–199)
HDL Cholesterol by NMR: 44 mg/dL (ref >39)
HDL Particle Number: 32.2 umol/L (ref >=30.5)
LDL Particle Number: 1349 nmol/L — ABNORMAL HIGH (ref <1000)
LDL Size: 20 nm (ref >20.5)
LDL-C: 77 mg/dL (ref 0–99)
LP-IR Score: 49 — ABNORMAL HIGH (ref <=45)
Small LDL Particle Number: 983 nmol/L — ABNORMAL HIGH (ref <=527)
Triglycerides by NMR: 169 mg/dL — ABNORMAL HIGH (ref 0–149)

## 2014-08-18 LAB — CMP14+EGFR
ALT: 23 IU/L (ref 0–32)
AST: 26 IU/L (ref 0–40)
Albumin/Globulin Ratio: 1.5 (ref 1.1–2.5)
Albumin: 4.4 g/dL (ref 3.5–4.8)
Alkaline Phosphatase: 100 IU/L (ref 39–117)
BUN/Creatinine Ratio: 13 (ref 11–26)
BUN: 14 mg/dL (ref 8–27)
Bilirubin Total: 0.4 mg/dL (ref 0.0–1.2)
CO2: 25 mmol/L (ref 18–29)
Calcium: 9.8 mg/dL (ref 8.7–10.3)
Chloride: 101 mmol/L (ref 97–108)
Creatinine, Ser: 1.05 mg/dL — ABNORMAL HIGH (ref 0.57–1.00)
GFR calc Af Amer: 60 mL/min/{1.73_m2} (ref >59)
GFR calc non Af Amer: 52 mL/min/{1.73_m2} — ABNORMAL LOW (ref >59)
Globulin, Total: 2.9 g/dL (ref 1.5–4.5)
Glucose: 115 mg/dL — ABNORMAL HIGH (ref 65–99)
Potassium: 4.4 mmol/L (ref 3.5–5.2)
Sodium: 141 mmol/L (ref 134–144)
Total Protein: 7.3 g/dL (ref 6.0–8.5)

## 2014-08-18 LAB — THYROID PANEL WITH TSH
Free Thyroxine Index: 1.6 (ref 1.2–4.9)
T3 Uptake Ratio: 26 % (ref 24–39)
T4, Total: 6.2 ug/dL (ref 4.5–12.0)
TSH: 2.76 u[IU]/mL (ref 0.450–4.500)

## 2014-08-18 LAB — VITAMIN D 25 HYDROXY (VIT D DEFICIENCY, FRACTURES): Vit D, 25-Hydroxy: 25 ng/mL — ABNORMAL LOW (ref 30.0–100.0)

## 2014-08-18 MED ORDER — ATORVASTATIN CALCIUM 80 MG PO TABS: 80.0000 mg | ORAL_TABLET | Freq: Every day | ORAL | Status: DC

## 2014-08-22 ENCOUNTER — Telehealth: Payer: Self-pay | Admitting: Family Medicine

## 2014-08-22 MED ORDER — SIMVASTATIN 40 MG PO TABS: 40.0000 mg | ORAL_TABLET | Freq: Every day | ORAL | Status: DC

## 2014-08-22 NOTE — Telephone Encounter (Signed)
She was already taking the maximum dose of Zocor, 40 mg daily. Have her go back to that for now I sent in a refill. Next time we check if it's still a bit high perhaps we could add Zetia.

## 2014-08-22 NOTE — Telephone Encounter (Signed)
The problem is she having with the atorvastatin?

## 2014-08-22 NOTE — Telephone Encounter (Signed)
Patient states that she seen that it can cause diabetes and she states that she is already borderline and she would just rather increase the simvastatin.

## 2014-08-23 ENCOUNTER — Telehealth: Payer: Self-pay | Admitting: Family Medicine

## 2014-08-23 MED ORDER — ATORVASTATIN CALCIUM 80 MG PO TABS: 80.0000 mg | ORAL_TABLET | Freq: Every day | ORAL | Status: DC

## 2014-08-23 NOTE — Telephone Encounter (Signed)
Detailed message left for patient.

## 2014-08-23 NOTE — Telephone Encounter (Signed)
Spoke with patient.

## 2014-08-25 ENCOUNTER — Encounter: Payer: Self-pay | Admitting: Family Medicine

## 2014-08-28 ENCOUNTER — Telehealth: Payer: Self-pay | Admitting: Family Medicine

## 2014-08-28 ENCOUNTER — Other Ambulatory Visit: Payer: Self-pay | Admitting: Family Medicine

## 2014-08-28 MED ORDER — VALSARTAN-HYDROCHLOROTHIAZIDE 160-12.5 MG PO TABS: 1.0000 | ORAL_TABLET | Freq: Every day | ORAL | Status: DC

## 2014-08-28 NOTE — Telephone Encounter (Signed)
Patient states that her BP is dropping with the valsartan/hctz. She states last night and this am 99/40 and she states that it is making her light headed, dizzy, nervous like she states that she just does not feel good since starting the valsartan/hctz.

## 2014-08-28 NOTE — Telephone Encounter (Signed)
Pt notified of Dr Livia Snellen recommendation Verbalizes understanding

## 2014-08-28 NOTE — Telephone Encounter (Signed)
Dose decreased to 160/12.5 called in.

## 2014-08-29 ENCOUNTER — Telehealth: Payer: Self-pay | Admitting: Family Medicine

## 2014-08-29 ENCOUNTER — Other Ambulatory Visit: Payer: Self-pay | Admitting: Family Medicine

## 2014-08-29 MED ORDER — TRAMADOL HCL 50 MG PO TABS: 50.0000 mg | ORAL_TABLET | Freq: Four times a day (QID) | ORAL | Status: DC | PRN

## 2014-08-29 MED ORDER — ESOMEPRAZOLE MAGNESIUM 40 MG PO CPDR: DELAYED_RELEASE_CAPSULE | ORAL | Status: DC

## 2014-08-29 NOTE — Telephone Encounter (Signed)
Please review and advise.

## 2014-08-29 NOTE — Telephone Encounter (Signed)
Pt notified RXs ready for pick up RXs to the front for pt pick up

## 2014-08-29 NOTE — Telephone Encounter (Signed)
Last seen 08/17/14, last filled 07/24/14. Both will print

## 2014-08-30 ENCOUNTER — Telehealth: Payer: Self-pay | Admitting: Family Medicine

## 2014-08-30 ENCOUNTER — Other Ambulatory Visit: Payer: Self-pay | Admitting: Family Medicine

## 2014-08-30 MED ORDER — TRAMADOL HCL 50 MG PO TABS: 50.0000 mg | ORAL_TABLET | Freq: Four times a day (QID) | ORAL | Status: DC | PRN

## 2014-08-30 MED ORDER — DIAZEPAM 5 MG PO TABS: ORAL_TABLET | ORAL | Status: DC

## 2014-08-30 NOTE — Telephone Encounter (Signed)
Patient aware that [facility] has [facility]

## 2014-09-05 ENCOUNTER — Ambulatory Visit (INDEPENDENT_AMBULATORY_CARE_PROVIDER_SITE_OTHER): Payer: Medicare Other

## 2014-09-05 ENCOUNTER — Ambulatory Visit (INDEPENDENT_AMBULATORY_CARE_PROVIDER_SITE_OTHER): Payer: Medicare Other | Admitting: Family

## 2014-09-05 ENCOUNTER — Encounter: Payer: Self-pay | Admitting: Family

## 2014-09-05 VITALS — BP 172/73 | HR 66 | Temp 97.4°F | Ht 68.0 in | Wt 241.0 lb

## 2014-09-05 DIAGNOSIS — W19XXXA Unspecified fall, initial encounter: Secondary | ICD-10-CM

## 2014-09-05 DIAGNOSIS — M25512 Pain in left shoulder: Secondary | ICD-10-CM

## 2014-09-05 DIAGNOSIS — R52 Pain, unspecified: Secondary | ICD-10-CM | POA: Diagnosis not present

## 2014-09-05 MED ORDER — KETOROLAC TROMETHAMINE 30 MG/ML IJ SOLN
30.0000 mg | Freq: Once | INTRAMUSCULAR | Status: AC
  Administered 2014-09-05: 30 mg via INTRAMUSCULAR

## 2014-09-05 MED ORDER — METHYLPREDNISOLONE ACETATE 80 MG/ML IJ SUSP
80.0000 mg | Freq: Once | INTRAMUSCULAR | Status: AC
  Administered 2014-09-05: 80 mg via INTRAMUSCULAR

## 2014-09-05 NOTE — Progress Notes (Signed)
   Subjective:    Patient ID: Lorraine Leonard, female    DOB: 1941/04/01, 74 y.o.   MRN: [id number]  Shoulder Pain  The pain is present in the left shoulder. This is a new problem. The current episode started more than 1 month ago. There has been a history of trauma (Pt lost her balance and supported herself with her left shoudler). The problem occurs constantly. The problem has been unchanged. The quality of the pain is described as aching. The pain is at a severity of 9/10. The pain is moderate. Associated symptoms include stiffness. Pertinent negatives include no fever, inability to bear weight, itching, joint swelling, limited range of motion, numbness or tingling. The symptoms are aggravated by activity. She has tried rest and acetaminophen for the symptoms. The treatment provided mild relief. Her past medical history is significant for osteoarthritis.      Review of Systems  Constitutional: Negative.  Negative for fever.  HENT: Negative.   Eyes: Negative.   Respiratory: Negative.  Negative for shortness of breath.   Cardiovascular: Negative.  Negative for palpitations.  Gastrointestinal: Negative.   Endocrine: Negative.   Genitourinary: Negative.   Musculoskeletal: Positive for stiffness.  Skin: Negative for itching.  Neurological: Negative.  Negative for tingling, numbness and headaches.  Hematological: Negative.   Psychiatric/Behavioral: Negative.   All other systems reviewed and are negative.      Objective:   Physical Exam  Constitutional: She is oriented to person, place, and time. She appears well-developed and well-nourished. No distress.  HENT:  Head: Normocephalic and atraumatic.  Eyes: Pupils are equal, round, and reactive to light.  Neck: Normal range of motion. Neck supple. No thyromegaly present.  Cardiovascular: Normal rate, regular rhythm, normal heart sounds and intact distal pulses.   No murmur heard. Pulmonary/Chest: Effort normal and breath sounds normal. No  respiratory distress. She has no wheezes.  Abdominal: Soft. Bowel sounds are normal. She exhibits no distension. There is no tenderness.  Musculoskeletal: Normal range of motion. She exhibits no edema or tenderness.  Neurological: She is alert and oriented to person, place, and time. She has normal reflexes. No cranial nerve deficit.  Skin: Skin is warm and dry.  Psychiatric: She has a normal mood and affect. Her behavior is normal. Judgment and thought content normal.  Vitals reviewed.  Shoulder X-ray- WNL Preliminary reading by Evelina Dun, FNP [facility]    BP 172/73 mmHg  Pulse 66  Temp(Src) 97.4 F (36.3 C) (Oral)  Ht $R'5\' 8"'OP$  (1.727 m)  Wt 241 lb (109.317 kg)  BMI 36.65 kg/m2     Assessment & Plan:  1. Pain - DG Shoulder Left; Future - BMP8+EGFR  2. Left shoulder pain -Rest -Continue Ultram as needed - ketorolac (TORADOL) 30 MG/ML injection 30 mg; Inject 1 mL (30 mg total) into the muscle once. - methylPREDNISolone acetate (DEPO-MEDROL) injection 80 mg; Inject 1 mL (80 mg total) into the muscle once. - BMP8+EGFR  3. Fall, initial encounter -Falls precaution discussed - ketorolac (TORADOL) 30 MG/ML injection 30 mg; Inject 1 mL (30 mg total) into the muscle once. - methylPREDNISolone acetate (DEPO-MEDROL) injection 80 mg; Inject 1 mL (80 mg total) into the muscle once. - BMP8+EGFR  Pt told to follow up about her HTN  Evelina Dun, FNP

## 2014-09-05 NOTE — Patient Instructions (Signed)
Shoulder Pain The shoulder is the joint that connects your arms to your body. The bones that form the shoulder joint include the upper arm bone (humerus), the shoulder blade (scapula), and the collarbone (clavicle). The top of the humerus is shaped like a ball and fits into a rather flat socket on the scapula (glenoid cavity). A combination of muscles and strong, fibrous tissues that connect muscles to bones (tendons) support your shoulder joint and hold the ball in the socket. Small, fluid-filled sacs (bursae) are located in different areas of the joint. They act as cushions between the bones and the overlying soft tissues and help reduce friction between the gliding tendons and the bone as you move your arm. Your shoulder joint allows a wide range of motion in your arm. This range of motion allows you to do things like scratch your back or throw a ball. However, this range of motion also makes your shoulder more prone to pain from overuse and injury. Causes of shoulder pain can originate from both injury and overuse and usually can be grouped in the following four categories:  Redness, swelling, and pain (inflammation) of the tendon (tendinitis) or the bursae (bursitis).  Instability, such as a dislocation of the joint.  Inflammation of the joint (arthritis).  Broken bone (fracture). HOME CARE INSTRUCTIONS   Apply ice to the sore area.  Put ice in a plastic bag.  Place a towel between your skin and the bag.  Leave the ice on for 15-20 minutes, 3-4 times per day for the first 2 days, or as directed by your health care provider.  Stop using cold packs if they do not help with the pain.  If you have a shoulder sling or immobilizer, wear it as long as your caregiver instructs. Only remove it to shower or bathe. Move your arm as little as possible, but keep your hand moving to prevent swelling.  Squeeze a soft ball or foam pad as much as possible to help prevent swelling.  Only take  over-the-counter or prescription medicines for pain, discomfort, or fever as directed by your caregiver. SEEK MEDICAL CARE IF:   Your shoulder pain increases, or new pain develops in your arm, hand, or fingers.  Your hand or fingers become cold and numb.  Your pain is not relieved with medicines. SEEK IMMEDIATE MEDICAL CARE IF:   Your arm, hand, or fingers are numb or tingling.  Your arm, hand, or fingers are significantly swollen or turn white or blue. MAKE SURE YOU:   Understand these instructions.  Will watch your condition.  Will get help right away if you are not doing well or get worse. Document Released: 01/15/2005 Document Revised: 08/22/2013 Document Reviewed: 03/22/2011 [facility] Patient Information 2015 [place], [place]. This information is not intended to replace advice given to you by your health care provider. Make sure you discuss any questions you have with your health care provider. Fall Prevention and Home Safety Falls cause injuries and can affect all age groups. It is possible to use preventive measures to significantly decrease the likelihood of falls. There are many simple measures which can make your home safer and prevent falls. OUTDOORS  Repair cracks and edges of walkways and driveways.  Remove high doorway thresholds.  Trim shrubbery on the main path into your home.  Have good outside lighting.  Clear walkways of tools, rocks, debris, and clutter.  Check that handrails are not broken and are securely fastened. Both sides of steps should have handrails.  Have leaves, snow, and ice cleared regularly.  Use sand or salt on walkways during winter months.  In the garage, clean up grease or oil spills. BATHROOM  Install night lights.  Install grab bars by the toilet and in the tub and shower.  Use non-skid mats or decals in the tub or shower.  Place a plastic non-slip stool in the shower to sit on, if needed.  Keep floors dry and clean up all  water on the floor immediately.  Remove soap buildup in the tub or shower on a regular basis.  Secure bath mats with non-slip, double-sided rug tape.  Remove throw rugs and tripping hazards from the floors. BEDROOMS  Install night lights.  Make sure a bedside light is easy to reach.  Do not use oversized bedding.  Keep a telephone by your bedside.  Have a firm chair with side arms to use for getting dressed.  Remove throw rugs and tripping hazards from the floor. KITCHEN  Keep handles on pots and pans turned toward the center of the stove. Use back burners when possible.  Clean up spills quickly and allow time for drying.  Avoid walking on wet floors.  Avoid hot utensils and knives.  Position shelves so they are not too high or low.  Place commonly used objects within easy reach.  If necessary, use a sturdy step stool with a grab bar when reaching.  Keep electrical cables out of the way.  Do not use floor polish or wax that makes floors slippery. If you must use wax, use non-skid floor wax.  Remove throw rugs and tripping hazards from the floor. STAIRWAYS  Never leave objects on stairs.  Place handrails on both sides of stairways and use them. Fix any loose handrails. Make sure handrails on both sides of the stairways are as long as the stairs.  Check carpeting to make sure it is firmly attached along stairs. Make repairs to worn or loose carpet promptly.  Avoid placing throw rugs at the top or bottom of stairways, or properly secure the rug with carpet tape to prevent slippage. Get rid of throw rugs, if possible.  Have an electrician put in a light switch at the top and bottom of the stairs. OTHER FALL PREVENTION TIPS  Wear low-heel or rubber-soled shoes that are supportive and fit well. Wear closed toe shoes.  When using a stepladder, make sure it is fully opened and both spreaders are firmly locked. Do not climb a closed stepladder.  Add color or contrast  paint or tape to grab bars and handrails in your home. Place contrasting color strips on first and last steps.  Learn and use mobility aids as needed. Install an electrical emergency response system.  Turn on lights to avoid dark areas. Replace light bulbs that burn out immediately. Get light switches that glow.  Arrange furniture to create clear pathways. Keep furniture in the same place.  Firmly attach carpet with non-skid or double-sided tape.  Eliminate uneven floor surfaces.  Select a carpet pattern that does not visually hide the edge of steps.  Be aware of all pets. OTHER HOME SAFETY TIPS  Set the water temperature for 120 F (48.8 C).  Keep emergency numbers on or near the telephone.  Keep smoke detectors on every level of the home and near sleeping areas. Document Released: 03/28/2002 Document Revised: 10/07/2011 Document Reviewed: 06/27/2011 [facility] Patient Information 2015 [place], [place]. This information is not intended to replace advice given to you by your  health care provider. Make sure you discuss any questions you have with your health care provider.

## 2014-09-06 ENCOUNTER — Other Ambulatory Visit: Payer: Self-pay | Admitting: Family Medicine

## 2014-09-11 DIAGNOSIS — H2511 Age-related nuclear cataract, right eye: Secondary | ICD-10-CM | POA: Diagnosis not present

## 2014-09-11 DIAGNOSIS — H25011 Cortical age-related cataract, right eye: Secondary | ICD-10-CM | POA: Diagnosis not present

## 2014-09-19 DIAGNOSIS — H2511 Age-related nuclear cataract, right eye: Secondary | ICD-10-CM | POA: Diagnosis not present

## 2014-09-21 ENCOUNTER — Encounter: Payer: Self-pay | Admitting: Family

## 2014-09-21 ENCOUNTER — Ambulatory Visit (INDEPENDENT_AMBULATORY_CARE_PROVIDER_SITE_OTHER): Payer: Medicare Other | Admitting: Family

## 2014-09-21 VITALS — BP 132/67 | HR 59 | Temp 97.7°F | Ht 68.0 in | Wt 238.8 lb

## 2014-09-21 DIAGNOSIS — I10 Essential (primary) hypertension: Secondary | ICD-10-CM | POA: Diagnosis not present

## 2014-09-21 NOTE — Patient Instructions (Signed)

## 2014-09-21 NOTE — Progress Notes (Signed)
   Subjective:    Patient ID: Lorraine Leonard, female    DOB: 03/02/1941, 74 y.o.   MRN: [id number]  Pt presents to the office to recheck HTN.  Hypertension This is a chronic problem. The current episode started more than 1 year ago. The problem has been waxing and waning since onset. The problem is uncontrolled. Associated symptoms include anxiety. Pertinent negatives include no headaches, palpitations, peripheral edema or shortness of breath. Risk factors for coronary artery disease include dyslipidemia, family history, obesity, post-menopausal state and sedentary lifestyle. Past treatments include angiotensin blockers and diuretics. The current treatment provides moderate improvement. Hypertensive end-organ damage includes a thyroid problem. There is no history of kidney disease, CAD/MI, CVA or heart failure.      Review of Systems  Constitutional: Negative.   HENT: Negative.   Eyes: Negative.   Respiratory: Negative.  Negative for shortness of breath.   Cardiovascular: Negative.  Negative for palpitations.  Gastrointestinal: Negative.   Endocrine: Negative.   Genitourinary: Negative.   Musculoskeletal: Negative.   Neurological: Negative.  Negative for headaches.  Hematological: Negative.   Psychiatric/Behavioral: Negative.   All other systems reviewed and are negative.      Objective:   Physical Exam  Constitutional: She is oriented to person, place, and time. She appears well-developed and well-nourished. No distress.  HENT:  Head: Normocephalic and atraumatic.  Eyes: Pupils are equal, round, and reactive to light.  Neck: Normal range of motion. Neck supple. No thyromegaly present.  Cardiovascular: Normal rate, regular rhythm, normal heart sounds and intact distal pulses.   No murmur heard. Pulmonary/Chest: Effort normal and breath sounds normal. No respiratory distress. She has no wheezes.  Abdominal: Soft. Bowel sounds are normal. She exhibits no distension. There is no  tenderness.  Musculoskeletal: Normal range of motion. She exhibits no edema or tenderness.  Neurological: She is alert and oriented to person, place, and time. She has normal reflexes. No cranial nerve deficit.  Skin: Skin is warm and dry.  Psychiatric: She has a normal mood and affect. Her behavior is normal. Judgment and thought content normal.  Vitals reviewed.     BP 153/69 mmHg  Pulse 58  Temp(Src) 97.7 F (36.5 C) (Oral)  Ht 5' 8" (1.727 m)  Wt 238 lb 12.8 oz (108.319 kg)  BMI 36.32 kg/m2     Assessment & Plan:  1. Essential hypertension -Daily blood pressure log given with instructions on how to fill out and told to bring to next visit -Dash diet information given -Exercise encouraged - Stress Management  -Continue current meds -RTO in 6months - BMP8+EGFR  Christy Hawks, FNP  

## 2014-09-22 LAB — BMP8+EGFR
BUN/Creatinine Ratio: 17 (ref 11–26)
BUN: 17 mg/dL (ref 8–27)
CO2: 29 mmol/L (ref 18–29)
Calcium: 9.6 mg/dL (ref 8.7–10.3)
Chloride: 102 mmol/L (ref 97–108)
Creatinine, Ser: 1.01 mg/dL — ABNORMAL HIGH (ref 0.57–1.00)
GFR calc Af Amer: 63 mL/min/{1.73_m2} (ref >59)
GFR calc non Af Amer: 55 mL/min/{1.73_m2} — ABNORMAL LOW (ref >59)
Glucose: 102 mg/dL — ABNORMAL HIGH (ref 65–99)
Potassium: 5.1 mmol/L (ref 3.5–5.2)
Sodium: 143 mmol/L (ref 134–144)

## 2014-09-28 ENCOUNTER — Encounter (INDEPENDENT_AMBULATORY_CARE_PROVIDER_SITE_OTHER): Payer: Medicare Other | Admitting: Ophthalmology

## 2014-09-28 DIAGNOSIS — I10 Essential (primary) hypertension: Secondary | ICD-10-CM

## 2014-09-28 DIAGNOSIS — H43813 Vitreous degeneration, bilateral: Secondary | ICD-10-CM

## 2014-09-28 DIAGNOSIS — H3532 Exudative age-related macular degeneration: Secondary | ICD-10-CM

## 2014-09-28 DIAGNOSIS — E11319 Type 2 diabetes mellitus with unspecified diabetic retinopathy without macular edema: Secondary | ICD-10-CM | POA: Diagnosis not present

## 2014-09-28 DIAGNOSIS — E11329 Type 2 diabetes mellitus with mild nonproliferative diabetic retinopathy without macular edema: Secondary | ICD-10-CM | POA: Diagnosis not present

## 2014-09-28 DIAGNOSIS — H35033 Hypertensive retinopathy, bilateral: Secondary | ICD-10-CM

## 2014-10-02 ENCOUNTER — Encounter: Payer: Self-pay | Admitting: *Deleted

## 2014-10-03 ENCOUNTER — Other Ambulatory Visit: Payer: Self-pay | Admitting: *Deleted

## 2014-10-03 MED ORDER — ONDANSETRON HCL 8 MG PO TABS: ORAL_TABLET | ORAL | Status: DC

## 2014-10-05 ENCOUNTER — Other Ambulatory Visit: Payer: Self-pay | Admitting: Family Medicine

## 2014-10-05 NOTE — Telephone Encounter (Signed)
Last seen 09/21/14 Lorraine Leonard  If approved print

## 2014-10-06 NOTE — Telephone Encounter (Signed)
RX ready for pick up 

## 2014-10-18 DIAGNOSIS — Z09 Encounter for follow-up examination after completed treatment for conditions other than malignant neoplasm: Secondary | ICD-10-CM | POA: Diagnosis not present

## 2014-10-18 DIAGNOSIS — D122 Benign neoplasm of ascending colon: Secondary | ICD-10-CM | POA: Diagnosis not present

## 2014-10-18 DIAGNOSIS — Z86010 Personal history of colonic polyps: Secondary | ICD-10-CM | POA: Diagnosis not present

## 2014-10-18 DIAGNOSIS — D126 Benign neoplasm of colon, unspecified: Secondary | ICD-10-CM | POA: Diagnosis not present

## 2014-10-18 DIAGNOSIS — K5730 Diverticulosis of large intestine without perforation or abscess without bleeding: Secondary | ICD-10-CM | POA: Diagnosis not present

## 2014-10-18 DIAGNOSIS — D123 Benign neoplasm of transverse colon: Secondary | ICD-10-CM | POA: Diagnosis not present

## 2014-10-30 ENCOUNTER — Encounter: Payer: Self-pay | Admitting: *Deleted

## 2014-10-30 DIAGNOSIS — H35039 Hypertensive retinopathy, unspecified eye: Secondary | ICD-10-CM

## 2014-11-01 ENCOUNTER — Other Ambulatory Visit: Payer: Self-pay | Admitting: Family Medicine

## 2014-11-02 NOTE — Telephone Encounter (Signed)
Last seen 09/21/14  Lorraine Leonard   If approved route to nurse to call into [facility]

## 2014-11-03 NOTE — Telephone Encounter (Signed)
rx called into pharmacy

## 2014-11-16 ENCOUNTER — Encounter (INDEPENDENT_AMBULATORY_CARE_PROVIDER_SITE_OTHER): Payer: Medicare Other | Admitting: Ophthalmology

## 2014-11-16 DIAGNOSIS — E11329 Type 2 diabetes mellitus with mild nonproliferative diabetic retinopathy without macular edema: Secondary | ICD-10-CM

## 2014-11-16 DIAGNOSIS — H35033 Hypertensive retinopathy, bilateral: Secondary | ICD-10-CM

## 2014-11-16 DIAGNOSIS — E11319 Type 2 diabetes mellitus with unspecified diabetic retinopathy without macular edema: Secondary | ICD-10-CM

## 2014-11-16 DIAGNOSIS — H3532 Exudative age-related macular degeneration: Secondary | ICD-10-CM

## 2014-11-16 DIAGNOSIS — I10 Essential (primary) hypertension: Secondary | ICD-10-CM | POA: Diagnosis not present

## 2014-11-16 DIAGNOSIS — H43813 Vitreous degeneration, bilateral: Secondary | ICD-10-CM

## 2014-11-17 ENCOUNTER — Ambulatory Visit (INDEPENDENT_AMBULATORY_CARE_PROVIDER_SITE_OTHER): Payer: Medicare Other | Admitting: Emergency Medicine

## 2014-11-17 ENCOUNTER — Ambulatory Visit (INDEPENDENT_AMBULATORY_CARE_PROVIDER_SITE_OTHER)
Admission: RE | Admit: 2014-11-17 | Discharge: 2014-11-17 | Disposition: A | Discharge status: Home / Self Care | Payer: Medicare Other | Source: Ambulatory Visit | Attending: Emergency Medicine | Admitting: Emergency Medicine

## 2014-11-17 ENCOUNTER — Other Ambulatory Visit: Payer: Self-pay | Admitting: *Deleted

## 2014-11-17 ENCOUNTER — Ambulatory Visit: Payer: Medicare Other | Admitting: Family Medicine

## 2014-11-17 ENCOUNTER — Telehealth: Payer: Self-pay | Admitting: Family Medicine

## 2014-11-17 ENCOUNTER — Encounter: Payer: Self-pay | Admitting: Emergency Medicine

## 2014-11-17 VITALS — BP 140/68 | HR 58 | Ht 67.0 in | Wt 236.0 lb

## 2014-11-17 DIAGNOSIS — M069 Rheumatoid arthritis, unspecified: Secondary | ICD-10-CM | POA: Diagnosis not present

## 2014-11-17 DIAGNOSIS — J849 Interstitial pulmonary disease, unspecified: Secondary | ICD-10-CM | POA: Diagnosis not present

## 2014-11-17 DIAGNOSIS — J8410 Pulmonary fibrosis, unspecified: Secondary | ICD-10-CM

## 2014-11-17 DIAGNOSIS — J310 Chronic rhinitis: Secondary | ICD-10-CM

## 2014-11-17 MED ORDER — SIMVASTATIN 40 MG PO TABS: 40.0000 mg | ORAL_TABLET | Freq: Every day | ORAL | Status: DC

## 2014-11-17 MED ORDER — MOMETASONE FUROATE 0.1 % EX CREA: TOPICAL_CREAM | CUTANEOUS | Status: DC

## 2014-11-17 NOTE — Assessment & Plan Note (Signed)
Continue Astelin nasal spray

## 2014-11-17 NOTE — Addendum Note (Signed)
Addended by: Ilean China on: 11/17/2014 03:03 PM   Modules accepted: Orders, Medications

## 2014-11-17 NOTE — Progress Notes (Signed)
Subjective:    Patient ID: Lorraine Leonard, female    DOB: April 05, 1941, 74 y.o.   MRN: [id number]  HPI 74 yo woman, never smoker, hx HTN, allergies, hyperlipidemia, ? Asthma. Seen here by Dr MR for cough, found to have some LLL interstitial changes and nodular changes on CT scan 2008. She reports more SOB than her last visit, has gained about 35 pounds. She has occasional cough and a tickle in her throat. Freq throat clearing.   ROV 74/15/13 -- hx cough, ? 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 Last time we repeated CT scan of the chest, ordered PFT. Returns feeling that her breathing is the same - no new sx. She is on zyrtec and nexium. Has been on nasal steroid in the past. She has lost 5 lbs.   ROV 74/23/14 -- hx cough, ? 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 Returns for f/u >> states that addition of astelin to zyrtec has been helpful - less PND and cough.   ROV 74/15/15 --Hx of allergies, RA, hyperlipidemia,  follows for cough, ILD. Last CT scan was 11/2011, last CXR was stable in 2014.  Not on immunosuppression for RA at this time. Breathing stable, minimal cough.   ROV 74/29/16 -- follow-up visit for history of rheumatoid arthritis with some associated mild interstitial lung disease. Chest x-ray performed on 11/02/13 showed some possible mild progression particularly in the left lung base. She returns today describing good breathing. She notes that she is deconditioned. This has affected her exercise tolerance. She is not on immune suppression for her RA. Occas cough, no wheeze.  She has constipation, sees optho for macular degeneration treatments. Uses astelin for nasal congestion.    No evidence obstruction on spirometry.  PULMONARY FUNCTON TEST 02/03/2012  FVC 2.91  FEV1 2.1  FEV1/FVC 72.2  FVC  % Predicted 89  FEV % Predicted 90  FeF 25-75 1.45  FeF 25-75 % Predicted 2.5    Past Medical History  Diagnosis Date  . Hypertension   . Diabetes  mellitus     pre-diabetic  . Allergic rhinitis   . Urticaria   . GERD (gastroesophageal reflux disease)   . Lung fibrosis   . Diverticulosis   . Macular degeneration   . Depression   . Hypothyroidism   . Anxiety   . Anxiety   . Rheumatoid arthritis(714.0)   . Hyperlipidemia      Family History  Problem Relation Age of Onset  . Diabetes    . Hypertension    . Coronary artery disease    . Stroke    . Asthma    . Kidney disease Sister   . Lung cancer Sister   . Lung cancer Sister   . Suicidality Son     committed suicide in 2009  . Hyperlipidemia    . Anxiety disorder    . Migraines       History   Social History  . Marital Status: Married    Spouse Name: N/A  . Number of Children: 4  . Years of Education: N/A   Occupational History  . retired     [employer]   Social History Main Topics  . Smoking status: Never Smoker   . Smokeless tobacco: Never Used  . Alcohol Use: No  . Drug Use: No  . Sexual Activity: Not on file   Other Topics Concern  . 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 Mobic [Meloxicam]      Outpatient Prescriptions Prior to Visit  Medication Sig Dispense Refill  . aspirin 81 MG tablet Take 81 mg by mouth daily.     Marland Kitchen atorvastatin (LIPITOR) 80 MG tablet Take 1 tablet (80 mg total) by mouth daily. (Patient taking differently: Take 40 mg by mouth daily. ) 90 tablet 1  . azelastine (ASTELIN) 0.1 % nasal spray PLACE 2 SPRAYS INTO BOTH NOSTRILS 2 (TWO) TIMES DAILY AS NEEDED. 30 mL 5  . calcium citrate (CALCITRATE - DOSED IN MG ELEMENTAL CALCIUM) 950 MG tablet Take 1 tablet by mouth daily.    . Cholecalciferol (VITAMIN D) 2000 UNITS CAPS Take 1 capsule by mouth daily.    Marland Kitchen conjugated estrogens (PREMARIN) vaginal cream Place 0.5 g vaginally daily. Use as directed    . cyanocobalamin 500 MCG tablet Take 2,000 mcg by mouth daily.     . diazepam  (VALIUM) 5 MG tablet TAKE 1 TABLET 2 TIMES A DAY AS NEEDED FOR ANXIETY 30 tablet 1  . esomeprazole (NEXIUM) 40 MG capsule TAKE 1 CAPSULE (40 MG TOTAL) BY MOUTH DAILY BEFORE BREAKFAST. 30 capsule 5  . levothyroxine (SYNTHROID, LEVOTHROID) 88 MCG tablet Take 1 tablet (88 mcg total) by mouth daily before breakfast. 30 tablet 5  . mometasone (ELOCON) 0.1 % cream APPLY TO AFFECTED AREA DAILY 45 g 1  . moxifloxacin (VIGAMOX) 0.5 % ophthalmic solution Place 1 drop into the right eye See admin instructions. Pt takes three times daily for 2 days after eye injection    . multivitamin-lutein (OCUVITE-LUTEIN) CAPS capsule Take 1 capsule by mouth daily.    . ondansetron (ZOFRAN) 8 MG tablet TAKE 1 TABLET EVERY 8 HOURS AS NEEDED FOR NAUSEA 20 tablet 1  . PATADAY 0.2 % SOLN Place 1 drop into both eyes daily.    . polyethylene glycol powder (GLYCOLAX/MIRALAX) powder Take 17 g by mouth 2 (two) times daily as needed. 3350 g 1  . PROAIR HFA 108 (90 BASE) MCG/ACT inhaler INHALE 2 PUFFS INTO THE LUNGS EVERY 6 (SIX) HOURS AS NEEDED FOR WHEEZING. 8.5 each 4  . simvastatin (ZOCOR) 40 MG tablet Take 1 tablet (40 mg total) by mouth at bedtime. 30 tablet 3  . traMADol (ULTRAM) 50 MG tablet TAKE 1 TABLET EVERY 6 HOURS AS NEEDED 90 tablet 0  . trimethoprim (TRIMPEX) 100 MG tablet Take 100 mg by mouth daily.    . valsartan-hydrochlorothiazide (DIOVAN HCT) 160-12.5 MG per tablet Take 1 tablet by mouth daily. 30 tablet 5  . vitamin C (ASCORBIC ACID) 500 MG tablet Take 500 mg by mouth daily.    . vitamin E 400 UNIT capsule Take 400 Units by mouth daily.    . VOLTAREN 1 % GEL APPLY 2 GRAMS TOPICALLY 4 (FOUR) TIMES DAILY. 100 g 3  . hydrOXYzine (ATARAX/VISTARIL) 10 MG tablet Take 10 mg by mouth daily.    Marland Kitchen levocetirizine (XYZAL) 5 MG tablet      No facility-administered medications prior to visit.         Objective:   Physical Exam Filed Vitals:   11/17/14 1408  BP: 140/68  Pulse: 58  Height: 5\' 7"  (1.702 m)  Weight:  236 lb (107.049 kg)  SpO2: 95%   Gen: Pleasant, obese woman, in no distress,  normal affect  ENT: No lesions,  mouth clear,  oropharynx clear, no postnasal drip  Neck: No JVD, no TMG, no carotid bruits  Lungs: No use of accessory muscles, few insp crackles L base  Cardiovascular: RRR, heart sounds normal, no murmur or gallops, no peripheral edema  Musculoskeletal: No deformities, no cyanosis or clubbing  Neuro: alert, non focal  Skin: Warm, no lesions or rashes    CT SCAN CHEST 12/19/11:  Comparison: Chest CT 01/12/2007.  Findings:  Mediastinum: Heart size is normal. There is no significant  pericardial fluid, thickening or pericardial calcification. The  pulmonic trunk is mildly dilated measuring 3.8 cm in diameter. No  pathologically enlarged mediastinal or hilar lymph nodes. Please  note that accurate exclusion of hilar adenopathy is limited on  noncontrast CT scans. Esophagus is unremarkable in appearance.  There is atherosclerosis of the thoracic aorta, the great vessels  of the mediastinum and the coronary arteries, including calcified  atherosclerotic plaque in the left anterior descending coronary  arteries.  Lungs/Pleura: No acute consolidative airspace disease. No pleural  effusions. Calcified granuloma in the right lower lobe. 6 mm  subpleural nodule associated with the lateral aspect of the right  major fissure (image 35 of series 5). Multiple other tiny 1-2 mm  nodules in the periphery of the lungs bilaterally, are favored to  represent areas of mucoid impaction within terminal bronchioles.  No other larger more suspicious appearing pulmonary nodules or  masses are otherwise identified. In the lungs bilaterally there  are some patchy areas of very mild peripheral subpleural  reticulation, most pronounced in the left lower lobe. In the left  lower lobe there is also some bronchial wall thickening with mild  cylindrical bronchiectasis and peripheral  bronchiolectasis. These  findings are very similar in comparison to prior study 01/12/2007.  Inspiratory and expiratory imaging is unremarkable.  Upper Abdomen: Mild decreased attenuation throughout the hepatic  parenchyma suggestive of very mild hepatic steatosis.  Musculoskeletal: There are no aggressive appearing lytic or blastic  lesions noted in the visualized portions of the skeleton.  IMPRESSION:  1. While there are some very mild fibrotic changes in the lungs,  most pronounced in the left lower lobe, the pattern is highly  nonspecific and is unchanged compared to remote prior examination  01/12/2007. Overall, this is favored to represent sequela of prior  infection/inflammation, rather than evidence of underlying  interstitial lung disease.  2. Mild dilatation of the pulmonic trunk (3.8 cm in diameter).  Clinical correlation for signs and symptoms of pulmonary arterial  hypertension may be warranted.  3. Atherosclerosis, including left anterior descending coronary  artery disease. Assessment for potential risk factor modification,  dietary therapy or pharmacologic therapy may be warranted, if  clinically indicated.  4. Mild hepatic steatosis.       Assessment & Plan:  Rheumatoid arthritis Not currently on any immunosuppression therapy  PULMONARY FIBROSIS Some evidence for slight progression on chest x-ray done a year ago. She appears to be clinically stable although notes that her exercise is limited. I would like to repeat her high-resolution CT scan to compare with prior. If she is having progression and we may need to consider referral back to rheumatology for treatment  Chronic rhinitis Continue Astelin nasal spray

## 2014-11-17 NOTE — Telephone Encounter (Signed)
Prescription sent to pharmacy.

## 2014-11-17 NOTE — Assessment & Plan Note (Signed)
Not currently on any immunosuppression therapy

## 2014-11-17 NOTE — Patient Instructions (Signed)
Please continue astelin nasal spray as you are using it We will perform a CT scan of your chest. You will be called with the results.  Follow with Dr Lamonte Sakai in 12 months or sooner if you have any problems

## 2014-11-17 NOTE — Assessment & Plan Note (Signed)
Some evidence for slight progression on chest x-ray done a year ago. She appears to be clinically stable although notes that her exercise is limited. I would like to repeat her high-resolution CT scan to compare with prior. If she is having progression and we may need to consider referral back to rheumatology for treatment

## 2014-11-21 ENCOUNTER — Other Ambulatory Visit: Payer: Self-pay | Admitting: Family

## 2014-11-22 ENCOUNTER — Other Ambulatory Visit: Payer: Medicare Other

## 2014-11-22 ENCOUNTER — Other Ambulatory Visit: Payer: Self-pay | Admitting: *Deleted

## 2014-11-22 MED ORDER — LEVOTHYROXINE SODIUM 88 MCG PO TABS: 88.0000 ug | ORAL_TABLET | Freq: Every day | ORAL | Status: DC

## 2014-11-22 NOTE — Telephone Encounter (Signed)
Left message , script for pain medication is ready.

## 2014-11-22 NOTE — Telephone Encounter (Signed)
RX ready for pick up 

## 2014-11-22 NOTE — Telephone Encounter (Signed)
Last seen 09/21/14  Lorraine Leonard  If approved print

## 2014-11-23 ENCOUNTER — Telehealth: Payer: Self-pay | Admitting: Emergency Medicine

## 2014-11-23 NOTE — Telephone Encounter (Signed)
RB out of the office until 11/28/14 I spoke with the pt and notified her of this and she verbalized understanding  RB, please advise results thanks!

## 2014-11-27 NOTE — Telephone Encounter (Signed)
lmtcb for pt.  

## 2014-11-27 NOTE — Telephone Encounter (Signed)
Pt returned call [phone]

## 2014-11-27 NOTE — Telephone Encounter (Signed)
Spoke with pt.  Discussed below per Dr. Lamonte Sakai.  She verbalized understanding and voiced no further questions or concerns at this time.

## 2014-11-27 NOTE — Telephone Encounter (Signed)
Please let the pt know that I have reviewed her CT's going back to 2008. There has been no change in the scar and interstitial changes over this time period. This is good news, suggests that there has been no further inflammation or injury in her lungs over this time period.

## 2014-11-28 ENCOUNTER — Encounter: Payer: Self-pay | Admitting: Family Medicine

## 2014-11-28 ENCOUNTER — Ambulatory Visit (INDEPENDENT_AMBULATORY_CARE_PROVIDER_SITE_OTHER): Payer: Medicare Other | Admitting: Family Medicine

## 2014-11-28 VITALS — Ht 67.0 in | Wt 236.6 lb

## 2014-11-28 DIAGNOSIS — H3530 Unspecified macular degeneration: Secondary | ICD-10-CM

## 2014-11-28 DIAGNOSIS — M1990 Unspecified osteoarthritis, unspecified site: Secondary | ICD-10-CM

## 2014-11-28 DIAGNOSIS — I10 Essential (primary) hypertension: Secondary | ICD-10-CM | POA: Diagnosis not present

## 2014-11-28 DIAGNOSIS — K5901 Slow transit constipation: Secondary | ICD-10-CM | POA: Diagnosis not present

## 2014-11-28 DIAGNOSIS — R350 Frequency of micturition: Secondary | ICD-10-CM

## 2014-11-28 DIAGNOSIS — E785 Hyperlipidemia, unspecified: Secondary | ICD-10-CM | POA: Diagnosis not present

## 2014-11-28 DIAGNOSIS — E039 Hypothyroidism, unspecified: Secondary | ICD-10-CM

## 2014-11-28 DIAGNOSIS — K5900 Constipation, unspecified: Secondary | ICD-10-CM | POA: Insufficient documentation

## 2014-11-28 LAB — POCT URINALYSIS DIPSTICK
Bilirubin, UA: NEGATIVE
Blood, UA: NEGATIVE
Glucose, UA: NEGATIVE
Ketones, UA: NEGATIVE
Nitrite, UA: NEGATIVE
Protein, UA: NEGATIVE
Spec Grav, UA: 1.015
Urobilinogen, UA: NEGATIVE
pH, UA: 5

## 2014-11-28 LAB — POCT UA - MICROSCOPIC ONLY
Casts, Ur, LPF, POC: NEGATIVE
Crystals, Ur, HPF, POC: NEGATIVE
Mucus, UA: NEGATIVE
Yeast, UA: NEGATIVE

## 2014-11-28 MED ORDER — VALSARTAN-HYDROCHLOROTHIAZIDE 160-12.5 MG PO TABS: 1.0000 | ORAL_TABLET | Freq: Every day | ORAL | Status: DC

## 2014-11-28 MED ORDER — VALSARTAN-HYDROCHLOROTHIAZIDE 160-25 MG PO TABS: 1.0000 | ORAL_TABLET | Freq: Every day | ORAL | Status: DC

## 2014-11-28 MED ORDER — LINACLOTIDE 145 MCG PO CAPS: 145.0000 ug | ORAL_CAPSULE | Freq: Every day | ORAL | Status: DC

## 2014-11-28 MED ORDER — BETAMETHASONE SOD PHOS & ACET 6 (3-3) MG/ML IJ SUSP
6.0000 mg | Freq: Once | INTRAMUSCULAR | Status: AC
  Administered 2014-11-28: 6 mg via INTRAMUSCULAR

## 2014-11-28 NOTE — Progress Notes (Signed)
Subjective:  Patient ID: Lorraine Leonard, female    DOB: [birth year]  Age: 74 y.o. MRN: [id number]  CC: Hypertension; Hyperlipidemia; and Hypothyroidism   HPI Lorraine Leonard presents for  follow-up of hypertension. Patient has no history of headache chest pain or shortness of breath or recent cough. Patient also denies symptoms of TIA such as numbness weakness lateralizing. Patient checks  blood pressure at home and has not had any elevated readings recently. Patient denies side effects from his medication. States taking it regularly.  Patient also  in for follow-up of elevated cholesterol. Doing well without complaints on current medication. Denies side effects of statin including myalgia and arthralgia and nausea. Also in today for liver function testing. Currently no chest pain, shortness of breath or other cardiovascular related symptoms noted.  Patient presents for follow-up on  thyroid. She has a history of hypothyroidism for many years. It has been stable recently. Pt. denies any change in  voice, loss of hair, heat or cold intolerance. Energy level has been adequate to good. She denies constipation and diarrhea. No myxedema. Medication is as noted below. Verified that pt is taking it daily on an empty stomach. Well tolerated.  Using miralax daily for constipation. Frequently has to use BID.  Amitiza didn't work - caused diarrhea. History Lorraine Leonard has a past medical history of Hypertension; Diabetes mellitus; Allergic rhinitis; Urticaria; GERD (gastroesophageal reflux disease); Lung fibrosis; Diverticulosis; Macular degeneration; Depression; Hypothyroidism; Anxiety; Anxiety; Rheumatoid arthritis(714.0); and Hyperlipidemia.   She has past surgical history that includes Lumbar disc surgery; Kidney surgery; Vaginal hysterectomy; and Tubal ligation.   Her family history includes Anxiety disorder in an other family member; Asthma in an other family member; Coronary artery disease in an other family  member; Diabetes in an other family member; Hyperlipidemia in an other family member; Hypertension in an other family member; Kidney disease in her sister; Lung cancer in her sister and sister; Migraines in an other family member; Stroke in an other family member; Suicidality in her son.She reports that she has never smoked. She has never used smokeless tobacco. She reports that she does not drink alcohol or use illicit drugs.  Current Outpatient Prescriptions on File Prior to Visit  Medication Sig Dispense Refill  . aspirin 81 MG tablet Take 81 mg by mouth daily.     Marland Kitchen azelastine (ASTELIN) 0.1 % nasal spray PLACE 2 SPRAYS INTO BOTH NOSTRILS 2 (TWO) TIMES DAILY AS NEEDED. 30 mL 5  . calcium citrate (CALCITRATE - DOSED IN MG ELEMENTAL CALCIUM) 950 MG tablet Take 1 tablet by mouth daily.    . Cholecalciferol (VITAMIN D) 2000 UNITS CAPS Take 1 capsule by mouth daily.    Marland Kitchen conjugated estrogens (PREMARIN) vaginal cream Place 0.5 g vaginally daily. Use as directed    . cyanocobalamin 500 MCG tablet Take 2,000 mcg by mouth daily.     . diazepam (VALIUM) 5 MG tablet TAKE 1 TABLET 2 TIMES A DAY AS NEEDED FOR ANXIETY 30 tablet 1  . esomeprazole (NEXIUM) 40 MG capsule TAKE 1 CAPSULE (40 MG TOTAL) BY MOUTH DAILY BEFORE BREAKFAST. 30 capsule 5  . levothyroxine (SYNTHROID, LEVOTHROID) 88 MCG tablet Take 1 tablet (88 mcg total) by mouth daily before breakfast. 30 tablet 6  . mometasone (ELOCON) 0.1 % cream APPLY TO AFFECTED AREA DAILY 45 g 1  . moxifloxacin (VIGAMOX) 0.5 % ophthalmic solution Place 1 drop into the right eye See admin instructions. Pt takes three times daily for 2 days after  eye injection    . multivitamin-lutein (OCUVITE-LUTEIN) CAPS capsule Take 1 capsule by mouth daily.    . ondansetron (ZOFRAN) 8 MG tablet TAKE 1 TABLET EVERY 8 HOURS AS NEEDED FOR NAUSEA 20 tablet 1  . PATADAY 0.2 % SOLN Place 1 drop into both eyes daily.    . polyethylene glycol powder (GLYCOLAX/MIRALAX) powder Take 17 g by  mouth 2 (two) times daily as needed. 3350 g 1  . PROAIR HFA 108 (90 BASE) MCG/ACT inhaler INHALE 2 PUFFS INTO THE LUNGS EVERY 6 (SIX) HOURS AS NEEDED FOR WHEEZING. 8.5 each 4  . simvastatin (ZOCOR) 40 MG tablet Take 1 tablet (40 mg total) by mouth at bedtime. 30 tablet 3  . traMADol (ULTRAM) 50 MG tablet TAKE 1 TABLET BY MOUTH EVERY 6 HOURS AS NEEDED 90 tablet 0  . trimethoprim (TRIMPEX) 100 MG tablet Take 100 mg by mouth daily.    . vitamin C (ASCORBIC ACID) 500 MG tablet Take 500 mg by mouth daily.    . vitamin E 400 UNIT capsule Take 400 Units by mouth daily.    . VOLTAREN 1 % GEL APPLY 2 GRAMS TOPICALLY 4 (FOUR) TIMES DAILY. 100 g 3   No current facility-administered medications on file prior to visit.    ROS Review of Systems  Constitutional: Negative for fever, chills, diaphoresis, appetite change, fatigue and unexpected weight change.  HENT: Negative for congestion, ear pain, hearing loss, postnasal drip, rhinorrhea, sneezing, sore throat and trouble swallowing.   Eyes: Negative for pain.  Respiratory: Negative for cough, chest tightness and shortness of breath.   Cardiovascular: Negative for chest pain and palpitations.  Gastrointestinal: Positive for constipation. Negative for nausea, vomiting, abdominal pain and diarrhea.  Genitourinary: Negative for dysuria, frequency and menstrual problem.  Musculoskeletal: Negative for joint swelling and arthralgias.  Skin: Negative for rash.  Neurological: Negative for dizziness, weakness, numbness and headaches.  Psychiatric/Behavioral: Negative for dysphoric mood and agitation.    Objective:  Ht $R'5\' 7"'kr$  (1.702 m)  Wt 236 lb 9.6 oz (107.321 kg)  BMI 37.05 kg/m2  BP Readings from Last 3 Encounters:  11/17/14 140/68  09/21/14 132/67  09/05/14 172/73    Wt Readings from Last 3 Encounters:  11/28/14 236 lb 9.6 oz (107.321 kg)  11/17/14 236 lb (107.049 kg)  09/21/14 238 lb 12.8 oz (108.319 kg)     Physical Exam  Constitutional:  She is oriented to person, place, and time. She appears well-developed and well-nourished. No distress.  HENT:  Head: Normocephalic and atraumatic.  Right Ear: External ear normal.  Left Ear: External ear normal.  Nose: Nose normal.  Mouth/Throat: Oropharynx is clear and moist.  Eyes: Conjunctivae and EOM are normal. Pupils are equal, round, and reactive to light.  Neck: Normal range of motion. Neck supple. No thyromegaly present.  Cardiovascular: Normal rate, regular rhythm and normal heart sounds.   No murmur heard. Pulmonary/Chest: Effort normal and breath sounds normal. No respiratory distress. She has no wheezes. She has no rales.  Abdominal: Soft. Bowel sounds are normal. She exhibits no distension. There is no tenderness.  Lymphadenopathy:    She has no cervical adenopathy.  Neurological: She is alert and oriented to person, place, and time. She has normal reflexes.  Skin: Skin is warm and dry.  Psychiatric: She has a normal mood and affect. Her behavior is normal. Judgment and thought content normal.    No results found for: HGBA1C  Lab Results  Component Value Date   WBC 9.7  08/17/2014   HGB 13.6 08/17/2014   HCT 42.9 08/17/2014   PLT 271 05/14/2011   GLUCOSE 102* 09/21/2014   CHOL 155 08/17/2014   TRIG 169* 08/17/2014   HDL 44 08/17/2014   LDLCALC 72 02/01/2014   ALT 23 08/17/2014   AST 26 08/17/2014   NA 143 09/21/2014   K 5.1 09/21/2014   CL 102 09/21/2014   CREATININE 1.01* 09/21/2014   BUN 17 09/21/2014   CO2 29 09/21/2014   TSH 2.760 08/17/2014    Ct Chest High Resolution  11/17/2014   CLINICAL DATA:  74 year old female with chronic shortness of breath. Suspected interstitial lung disease.  EXAM: CT CHEST WITHOUT CONTRAST  TECHNIQUE: Multidetector CT imaging of the chest was performed following the standard protocol without intravenous contrast. High resolution imaging of the lungs, as well as inspiratory and expiratory imaging, was performed.  COMPARISON:   Chest CT 12/19/2011.  FINDINGS: Mediastinum/Lymph Nodes: Heart size is normal. There is no significant pericardial fluid, thickening or pericardial calcification. There is atherosclerosis of the thoracic aorta, the great vessels of the mediastinum and the coronary arteries, including calcified atherosclerotic plaque in the left anterior descending coronary arteries. Dilatation of the pulmonic trunk (4 cm in diameter), increasing compared to the prior examination. No pathologically enlarged mediastinal or hilar lymph nodes. Please note that accurate exclusion of hilar adenopathy is limited on noncontrast CT scans. Esophagus is unremarkable in appearance. No axillary lymphadenopathy.  Lungs/Pleura: High-resolution images again demonstrate some patchy areas of very mild subpleural reticulation, most evident in the periphery of the lung bases bilaterally (left greater than right). In the basal segments of the lower lobes of the lungs there is associated bronchial wall thickening with some mild cylindrical bronchiectasis and peripheral bronchiolectasis, which appears essentially unchanged compared to the most recent prior examination, which was also very similar to prior study 01/12/2007. No progressive findings are noted on today's examination. Inspiratory and expiratory imaging demonstrates some very mild air trapping, indicative of mild small airways disease. 6 mm subpleural nodule in the periphery of the right lower lobe (image 33 of series 6) is unchanged, likely a subpleural lymph node. A few other scattered 1-2 mm pulmonary nodules are again noted in the periphery of the lungs, similar to the prior study, favored to represent benign areas of mucoid impaction within terminal bronchioles. No larger more suspicious appearing pulmonary nodules or masses are otherwise noted. No acute consolidative airspace disease. No pleural effusions.  Upper Abdomen: Unremarkable. Specifically, previously noted hepatic steatosis is  no longer evident.  Musculoskeletal/Soft Tissues: There are no aggressive appearing lytic or blastic lesions noted in the visualized portions of the skeleton.  IMPRESSION: 1. There again some areas of mild fibrosis in the lungs bilaterally (predominantly in the extreme lung bases), which are stable compared to prior examinations dating back to 2008. 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 No signs of progressive interstitial lung disease such as UIP (usual interstitial pneumonia) at this time. 2. Dilatation of the pulmonic trunk now measuring 4 cm in diameter, increased slightly compared to the prior study, suggestive of underlying pulmonary arterial hypertension. 3. Atherosclerosis, including left anterior descending coronary artery disease. Assessment for potential risk factor modification, dietary therapy or pharmacologic therapy may be warranted, if clinically indicated. 4. Additional incidental findings, as above.   Electronically Signed   By: Vinnie Langton M.D.   On: 11/17/2014 16:23    Assessment & Plan:   Lorraine Leonard was seen today for hypertension, hyperlipidemia and hypothyroidism.  Diagnoses and all orders for this visit:  Urinary frequency Orders: -     POCT UA - Microscopic Only -     POCT urinalysis dipstick -     CBC with Differential/Platelet  Essential hypertension Orders: -     Cancel: POCT CBC -     CMP14+EGFR -     betamethasone acetate-betamethasone sodium phosphate (CELESTONE) injection 6 mg; Inject 1 mL (6 mg total) into the muscle once. -     Cancel: POCT CBC -     Cancel: CMP14+EGFR -     Cancel: Lipid panel -     TSH -     T4, Free -     CBC with Differential/Platelet  Hyperlipemia Orders: -     Lipid panel -     Cancel: Lipid panel -     CBC with Differential/Platelet  Macular degeneration of both eyes Orders: -     CBC with Differential/Platelet  Slow transit constipation Orders: -     TSH -     T4, Free -     CBC with  Differential/Platelet  Arthritis Orders: -     betamethasone acetate-betamethasone sodium phosphate (CELESTONE) injection 6 mg; Inject 1 mL (6 mg total) into the muscle once. -     CBC with Differential/Platelet  Hypothyroidism, unspecified hypothyroidism type Orders: -     CBC with Differential/Platelet  Other orders -     Linaclotide (LINZESS) 145 MCG CAPS capsule; Take 1 capsule (145 mcg total) by mouth daily. -     Discontinue: valsartan-hydrochlorothiazide (DIOVAN HCT) 160-12.5 MG per tablet; Take 1 tablet by mouth daily. -     valsartan-hydrochlorothiazide (DIOVAN HCT) 160-25 MG per tablet; Take 1 tablet by mouth daily.   I have discontinued Ms. Carne's valsartan-hydrochlorothiazide and valsartan-hydrochlorothiazide. I am also having her start on Linaclotide and valsartan-hydrochlorothiazide. Additionally, I am having her maintain her aspirin, trimethoprim, moxifloxacin, conjugated estrogens, PATADAY, Vitamin D, calcium citrate, vitamin E, multivitamin-lutein, cyanocobalamin, vitamin C, PROAIR HFA, azelastine, polyethylene glycol powder, esomeprazole, VOLTAREN, ondansetron, diazepam, simvastatin, mometasone, traMADol, levothyroxine, and atorvastatin. We administered betamethasone acetate-betamethasone sodium phosphate.  Meds ordered this encounter  Medications  . atorvastatin (LIPITOR) 80 MG tablet    Sig: TAKE 1 TABLET (80 MG TOTAL) BY MOUTH DAILY.    Refill:  3  . Linaclotide (LINZESS) 145 MCG CAPS capsule    Sig: Take 1 capsule (145 mcg total) by mouth daily.    Dispense:  30 capsule    Refill:  5  . betamethasone acetate-betamethasone sodium phosphate (CELESTONE) injection 6 mg    Sig:   . DISCONTD: valsartan-hydrochlorothiazide (DIOVAN HCT) 160-12.5 MG per tablet    Sig: Take 1 tablet by mouth daily.    Dispense:  30 tablet    Refill:  5  . valsartan-hydrochlorothiazide (DIOVAN HCT) 160-25 MG per tablet    Sig: Take 1 tablet by mouth daily.    Dispense:  30 tablet     Refill:  5     Follow-up: Return in about 3 months (around 02/28/2015).  Claretta Fraise, M.D.

## 2014-11-29 LAB — CMP14+EGFR
ALT: 21 IU/L (ref 0–32)
AST: 32 IU/L (ref 0–40)
Albumin/Globulin Ratio: 1.3 (ref 1.1–2.5)
Albumin: 4.3 g/dL (ref 3.5–4.8)
Alkaline Phosphatase: 100 IU/L (ref 39–117)
BUN/Creatinine Ratio: 14 (ref 11–26)
BUN: 14 mg/dL (ref 8–27)
Bilirubin Total: 0.3 mg/dL (ref 0.0–1.2)
CO2: 28 mmol/L (ref 18–29)
Calcium: 10.3 mg/dL (ref 8.7–10.3)
Chloride: 99 mmol/L (ref 97–108)
Creatinine, Ser: 0.99 mg/dL (ref 0.57–1.00)
GFR calc Af Amer: 65 mL/min/{1.73_m2} (ref >59)
GFR calc non Af Amer: 56 mL/min/{1.73_m2} — ABNORMAL LOW (ref >59)
Globulin, Total: 3.2 g/dL (ref 1.5–4.5)
Glucose: 112 mg/dL — ABNORMAL HIGH (ref 65–99)
Potassium: 4.8 mmol/L (ref 3.5–5.2)
Sodium: 143 mmol/L (ref 134–144)
Total Protein: 7.5 g/dL (ref 6.0–8.5)

## 2014-11-29 LAB — LIPID PANEL
Chol/HDL Ratio: 3.9 ratio units (ref 0.0–4.4)
Cholesterol, Total: 149 mg/dL (ref 100–199)
HDL: 38 mg/dL — ABNORMAL LOW (ref >39)
LDL Calculated: 72 mg/dL (ref 0–99)
Triglycerides: 195 mg/dL — ABNORMAL HIGH (ref 0–149)
VLDL Cholesterol Cal: 39 mg/dL (ref 5–40)

## 2014-11-29 LAB — CBC WITH DIFFERENTIAL/PLATELET
Basophils Absolute: 0 10*3/uL (ref 0.0–0.2)
Basos: 0 %
EOS (ABSOLUTE): 0.2 10*3/uL (ref 0.0–0.4)
Eos: 1 %
Hematocrit: 40.9 % (ref 34.0–46.6)
Hemoglobin: 13.4 g/dL (ref 11.1–15.9)
Immature Grans (Abs): 0 10*3/uL (ref 0.0–0.1)
Immature Granulocytes: 0 %
Lymphocytes Absolute: 2.6 10*3/uL (ref 0.7–3.1)
Lymphs: 23 %
MCH: 28.6 pg (ref 26.6–33.0)
MCHC: 32.8 g/dL (ref 31.5–35.7)
MCV: 87 fL (ref 79–97)
Monocytes Absolute: 0.7 10*3/uL (ref 0.1–0.9)
Monocytes: 7 %
Neutrophils Absolute: 7.6 10*3/uL — ABNORMAL HIGH (ref 1.4–7.0)
Neutrophils: 69 %
Platelets: 308 10*3/uL (ref 150–379)
RBC: 4.69 x10E6/uL (ref 3.77–5.28)
RDW: 14.1 % (ref 12.3–15.4)
WBC: 11.1 10*3/uL — ABNORMAL HIGH (ref 3.4–10.8)

## 2014-11-29 LAB — TSH: TSH: 1.94 u[IU]/mL (ref 0.450–4.500)

## 2014-11-29 LAB — T4, FREE: Free T4: 1.21 ng/dL (ref 0.82–1.77)

## 2014-12-13 ENCOUNTER — Ambulatory Visit (INDEPENDENT_AMBULATORY_CARE_PROVIDER_SITE_OTHER): Payer: Medicare Other | Admitting: Family Medicine

## 2014-12-13 ENCOUNTER — Encounter: Payer: Self-pay | Admitting: Family Medicine

## 2014-12-13 VITALS — BP 122/73 | HR 82 | Temp 97.0°F | Ht 67.0 in | Wt 233.0 lb

## 2014-12-13 DIAGNOSIS — L509 Urticaria, unspecified: Secondary | ICD-10-CM | POA: Diagnosis not present

## 2014-12-13 DIAGNOSIS — T7840XD Allergy, unspecified, subsequent encounter: Secondary | ICD-10-CM

## 2014-12-13 MED ORDER — PREDNISONE 10 MG PO TABS: ORAL_TABLET | ORAL | Status: DC

## 2014-12-13 MED ORDER — METHYLPREDNISOLONE ACETATE 80 MG/ML IJ SUSP
60.0000 mg | Freq: Once | INTRAMUSCULAR | Status: AC
Start: 2014-12-13 — End: 2014-12-13
  Administered 2014-12-13: 60 mg via INTRAMUSCULAR

## 2014-12-13 NOTE — Patient Instructions (Signed)
The patient should only use scent free soaps, fabric softeners and detergents No more [employer] products Stay cool and drink plenty of cool fluids Take medication as directed, in addition may take Benadryl at nighttime Please get back in touch with [place] if problems continue Avoid shellfish. Peanuts and chocolate

## 2014-12-13 NOTE — Progress Notes (Signed)
Subjective:    Patient ID: Lorraine Leonard, female    DOB: [birth year], 74 y.o.   MRN: [id number]  HPI Patient here today for hives that started about 2 weeks ago. They come and go and are on different areas of her body. She also has a history of some nausea and multiple other medical problems. This patient has been having problems with hives off and on for years and is especially been bad over the past week and a half. At that time she has started some linzess for her stomach which she has since stopped. She doesn't have any shortness of breath associated with this. She dates the hives back to [place] for back as 1995. She is currently taking Allegra. She has had some nausea and headaches. Her daughter sells [place] products.       Patient Active Problem List   Diagnosis Date Noted  . Macular degeneration of both eyes 11/28/2014  . Constipation 11/28/2014  . Chronic rhinitis 11/17/2014  . Hypertensive retinopathy 10/30/2014  . Vitamin D deficiency 08/17/2014  . Arthritis of knee 08/17/2014  . Hyperlipemia 08/17/2014  . Depression   . Hypothyroidism   . Anxiety   . Rheumatoid arthritis   . Hyperlipidemia   . Cardiomegaly 05/05/2011  . Essential hypertension 02/08/2007  . 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 GASTROESOPHAGEAL REFLUX DISEASE 02/08/2007   Outpatient Encounter Prescriptions as of 12/13/2014  Medication Sig  . aspirin 81 MG tablet Take 81 mg by mouth daily.   Marland Kitchen azelastine (ASTELIN) 0.1 % nasal spray PLACE 2 SPRAYS INTO BOTH NOSTRILS 2 (TWO) TIMES DAILY AS NEEDED.  . calcium citrate (CALCITRATE - DOSED IN MG ELEMENTAL CALCIUM) 950 MG tablet Take 1 tablet by mouth daily.  . Cholecalciferol (VITAMIN D) 2000 UNITS CAPS Take 1 capsule by mouth daily.  Marland Kitchen conjugated estrogens (PREMARIN) vaginal cream Place 0.5 g vaginally daily. Use as directed  . cyanocobalamin 500 MCG tablet Take 2,000 mcg by mouth daily.   . diazepam (VALIUM) 5 MG tablet TAKE 1 TABLET 2  TIMES A DAY AS NEEDED FOR ANXIETY  . esomeprazole (NEXIUM) 40 MG capsule TAKE 1 CAPSULE (40 MG TOTAL) BY MOUTH DAILY BEFORE BREAKFAST.  Marland Kitchen levothyroxine (SYNTHROID, LEVOTHROID) 88 MCG tablet Take 1 tablet (88 mcg total) by mouth daily before breakfast.  . mometasone (ELOCON) 0.1 % cream APPLY TO AFFECTED AREA DAILY  . moxifloxacin (VIGAMOX) 0.5 % ophthalmic solution Place 1 drop into the right eye See admin instructions. Pt takes three times daily for 2 days after eye injection  . ondansetron (ZOFRAN) 8 MG tablet TAKE 1 TABLET EVERY 8 HOURS AS NEEDED FOR NAUSEA  . PATADAY 0.2 % SOLN Place 1 drop into both eyes daily.  . polyethylene glycol powder (GLYCOLAX/MIRALAX) powder Take 17 g by mouth 2 (two) times daily as needed.  Marland Kitchen PROAIR HFA 108 (90 BASE) MCG/ACT inhaler INHALE 2 PUFFS INTO THE LUNGS EVERY 6 (SIX) HOURS AS NEEDED FOR WHEEZING.  . traMADol (ULTRAM) 50 MG tablet TAKE 1 TABLET BY MOUTH EVERY 6 HOURS AS NEEDED  . trimethoprim (TRIMPEX) 100 MG tablet Take 100 mg by mouth daily.  . valsartan-hydrochlorothiazide (DIOVAN HCT) 160-25 MG per tablet Take 1 tablet by mouth daily.  . vitamin C (ASCORBIC ACID) 500 MG tablet Take 500 mg by mouth daily.  . vitamin E 400 UNIT capsule Take 400 Units by mouth daily.  . VOLTAREN 1 % GEL APPLY 2 GRAMS TOPICALLY 4 (FOUR) TIMES DAILY.  . [  DISCONTINUED] atorvastatin (LIPITOR) 80 MG tablet TAKE 1 TABLET (80 MG TOTAL) BY MOUTH DAILY.  . [DISCONTINUED] Linaclotide (LINZESS) 145 MCG CAPS capsule Take 1 capsule (145 mcg total) by mouth daily.  . [DISCONTINUED] multivitamin-lutein (OCUVITE-LUTEIN) CAPS capsule Take 1 capsule by mouth daily.  . [DISCONTINUED] simvastatin (ZOCOR) 40 MG tablet Take 1 tablet (40 mg total) by mouth at bedtime.   No facility-administered encounter medications on file as of 12/13/2014.      Review of Systems  Constitutional: Negative.   HENT: Negative.   Eyes: Negative.   Respiratory: Negative.   Cardiovascular: Negative.     Gastrointestinal: Positive for nausea.  Endocrine: Negative.   Genitourinary: Negative.   Musculoskeletal: Negative.   Skin: Positive for rash (hives).  Allergic/Immunologic: Negative.   Neurological: Negative.   Hematological: Negative.   Psychiatric/Behavioral: Negative.        Objective:   Physical Exam  Constitutional: She is oriented to person, place, and time. She appears well-developed and well-nourished. No distress.  Pleasant and alert and frustrated  HENT:  Head: Normocephalic and atraumatic.  Right Ear: External ear normal.  Left Ear: External ear normal.  Nose: Nose normal.  Mouth/Throat: Oropharynx is clear and moist.  Eyes: Conjunctivae and EOM are normal. Pupils are equal, round, and reactive to light. Right eye exhibits no discharge. Left eye exhibits no discharge. No scleral icterus.  Neck: Normal range of motion. Neck supple. No thyromegaly present.  Cardiovascular: Normal rate, regular rhythm and normal heart sounds.   No murmur heard. Pulmonary/Chest: Effort normal and breath sounds normal. She has no wheezes. She has no rales.  Clear anteriorly and posteriorly  Abdominal: Soft. Bowel sounds are normal. She exhibits no mass. There is no tenderness. There is no rebound and no guarding.  Musculoskeletal: Normal range of motion. She exhibits no edema or tenderness.  Lymphadenopathy:    She has no cervical adenopathy.  Neurological: She is alert and oriented to person, place, and time. No cranial nerve deficit.  Skin: Skin is warm and dry. Rash noted.  There is a large welt in the popliteal fossa of both knees  Psychiatric: She has a normal mood and affect. Her behavior is normal. Judgment and thought content normal.  Nursing note and vitals reviewed.  BP 122/73 mmHg  Pulse 82  Temp(Src) 97 F (36.1 C) (Oral)  Ht 5\' 7"  (1.702 m)  Wt 233 lb (105.688 kg)  BMI 36.48 kg/m2        Assessment & Plan:  1. Hives - predniSONE (DELTASONE) 10 MG tablet; 1  tablet 4 times a day for 2 days,  1 tablet 3 times a day for 2 days,  1 tablet 2 times a day for 2 days, 1 tablet daily for 2 days  Dispense: 20 tablet; Refill: 0 - methylPREDNISolone acetate (DEPO-MEDROL) injection 60 mg; Inject 0.75 mLs (60 mg total) into the muscle once.  2. Allergic reaction, subsequent encounter - predniSONE (DELTASONE) 10 MG tablet; 1 tablet 4 times a day for 2 days,  1 tablet 3 times a day for 2 days,  1 tablet 2 times a day for 2 days, 1 tablet daily for 2 days  Dispense: 20 tablet; Refill: 0 - methylPREDNISolone acetate (DEPO-MEDROL) injection 60 mg; Inject 0.75 mLs (60 mg total) into the muscle once.  Patient Instructions  The patient should only use scent free soaps, fabric softeners and detergents No more [employer] products Stay cool and drink plenty of cool fluids Take medication as directed, in addition  may take Benadryl at nighttime Please get back in touch with [place] if problems continue Avoid shellfish. Peanuts and chocolate   Arrie Senate MD

## 2014-12-15 ENCOUNTER — Other Ambulatory Visit: Payer: Self-pay | Admitting: Family

## 2014-12-15 MED ORDER — DIAZEPAM 5 MG PO TABS
5.0000 mg | ORAL_TABLET | Freq: Two times a day (BID) | ORAL | Status: DC | PRN
Start: 2014-12-15 — End: 2015-01-09

## 2014-12-15 NOTE — Telephone Encounter (Signed)
Just filled 12/07/14 for #30,  Taking bid

## 2014-12-15 NOTE — Telephone Encounter (Signed)
Script for valium called to [facility], vm.

## 2015-01-01 ENCOUNTER — Other Ambulatory Visit: Payer: Self-pay | Admitting: Family

## 2015-01-01 NOTE — Telephone Encounter (Signed)
Last filled 11/22/14, last seen 11/28/14. Rx will print

## 2015-01-09 ENCOUNTER — Other Ambulatory Visit: Payer: Self-pay | Admitting: Family

## 2015-01-09 NOTE — Telephone Encounter (Signed)
Last seen 11/28/14  Dr Livia Snellen  If approved route to nurse to call into [facility]

## 2015-01-09 NOTE — Telephone Encounter (Signed)
Please review and advise.

## 2015-01-10 NOTE — Telephone Encounter (Signed)
Rx for Valium called into [facility] Okayed per Dr Livia Snellen

## 2015-01-11 ENCOUNTER — Encounter (INDEPENDENT_AMBULATORY_CARE_PROVIDER_SITE_OTHER): Payer: Medicare Other | Admitting: Ophthalmology

## 2015-01-11 ENCOUNTER — Other Ambulatory Visit: Payer: Self-pay | Admitting: *Deleted

## 2015-01-11 DIAGNOSIS — H43813 Vitreous degeneration, bilateral: Secondary | ICD-10-CM | POA: Diagnosis not present

## 2015-01-11 DIAGNOSIS — E11329 Type 2 diabetes mellitus with mild nonproliferative diabetic retinopathy without macular edema: Secondary | ICD-10-CM | POA: Diagnosis not present

## 2015-01-11 DIAGNOSIS — E11319 Type 2 diabetes mellitus with unspecified diabetic retinopathy without macular edema: Secondary | ICD-10-CM

## 2015-01-11 DIAGNOSIS — I10 Essential (primary) hypertension: Secondary | ICD-10-CM

## 2015-01-11 DIAGNOSIS — H3532 Exudative age-related macular degeneration: Secondary | ICD-10-CM | POA: Diagnosis not present

## 2015-01-11 DIAGNOSIS — H35033 Hypertensive retinopathy, bilateral: Secondary | ICD-10-CM

## 2015-01-11 NOTE — Telephone Encounter (Signed)
Last seen 11/28/14  Dr Livia Snellen

## 2015-01-12 ENCOUNTER — Telehealth: Payer: Self-pay

## 2015-01-12 ENCOUNTER — Ambulatory Visit (HOSPITAL_COMMUNITY)
Admission: RE | Admit: 2015-01-12 | Discharge: 2015-01-12 | Disposition: A | Discharge status: Home / Self Care | Payer: Medicare Other | Source: Ambulatory Visit | Attending: Family Medicine | Admitting: Family Medicine

## 2015-01-12 ENCOUNTER — Ambulatory Visit (INDEPENDENT_AMBULATORY_CARE_PROVIDER_SITE_OTHER): Payer: Medicare Other | Admitting: Family Medicine

## 2015-01-12 ENCOUNTER — Encounter: Payer: Self-pay | Admitting: Family Medicine

## 2015-01-12 VITALS — BP 133/65 | HR 64 | Temp 97.5°F | Ht 67.0 in | Wt 233.6 lb

## 2015-01-12 DIAGNOSIS — R110 Nausea: Secondary | ICD-10-CM | POA: Insufficient documentation

## 2015-01-12 DIAGNOSIS — R1013 Epigastric pain: Secondary | ICD-10-CM

## 2015-01-12 DIAGNOSIS — K5730 Diverticulosis of large intestine without perforation or abscess without bleeding: Secondary | ICD-10-CM | POA: Diagnosis not present

## 2015-01-12 DIAGNOSIS — R109 Unspecified abdominal pain: Secondary | ICD-10-CM | POA: Diagnosis not present

## 2015-01-12 DIAGNOSIS — K5900 Constipation, unspecified: Secondary | ICD-10-CM | POA: Diagnosis not present

## 2015-01-12 DIAGNOSIS — R933 Abnormal findings on diagnostic imaging of other parts of digestive tract: Secondary | ICD-10-CM | POA: Diagnosis not present

## 2015-01-12 DIAGNOSIS — Z905 Acquired absence of kidney: Secondary | ICD-10-CM | POA: Diagnosis not present

## 2015-01-12 LAB — CMP14+EGFR
ALT: 23 IU/L (ref 0–32)
AST: 31 IU/L (ref 0–40)
Albumin/Globulin Ratio: 1.4 (ref 1.1–2.5)
Albumin: 4.2 g/dL (ref 3.5–4.8)
Alkaline Phosphatase: 88 IU/L (ref 39–117)
BUN/Creatinine Ratio: 11 (ref 11–26)
BUN: 12 mg/dL (ref 8–27)
Bilirubin Total: 0.4 mg/dL (ref 0.0–1.2)
CO2: 29 mmol/L (ref 18–29)
Calcium: 9.9 mg/dL (ref 8.7–10.3)
Chloride: 101 mmol/L (ref 96–108)
Creatinine, Ser: 1.11 mg/dL — ABNORMAL HIGH (ref 0.57–1.00)
GFR calc Af Amer: 57 mL/min/{1.73_m2} — ABNORMAL LOW (ref >59)
GFR calc non Af Amer: 49 mL/min/{1.73_m2} — ABNORMAL LOW (ref >59)
Globulin, Total: 3 g/dL (ref 1.5–4.5)
Glucose: 144 mg/dL — ABNORMAL HIGH (ref 65–99)
Potassium: 4.5 mmol/L (ref 3.5–5.2)
Sodium: 140 mmol/L (ref 134–144)
Total Protein: 7.2 g/dL (ref 6.0–8.5)

## 2015-01-12 LAB — LIPASE: Lipase: 33 U/L (ref 0–59)

## 2015-01-12 LAB — POCT I-STAT CREATININE: Creatinine, Ser: 1.2 mg/dL — ABNORMAL HIGH (ref 0.44–1.00)

## 2015-01-12 MED ORDER — IOHEXOL 300 MG/ML  SOLN
100.0000 mL | Freq: Once | INTRAMUSCULAR | Status: AC | PRN
  Administered 2015-01-12: 100 mL via INTRAVENOUS

## 2015-01-12 MED ORDER — GI COCKTAIL ~~LOC~~
30.0000 mL | Freq: Once | ORAL | Status: AC
  Administered 2015-01-12: 30 mL via ORAL

## 2015-01-12 MED ORDER — ONDANSETRON HCL 8 MG PO TABS: ORAL_TABLET | ORAL | Status: DC

## 2015-01-12 MED ORDER — METRONIDAZOLE 500 MG PO TABS: 500.0000 mg | ORAL_TABLET | Freq: Two times a day (BID) | ORAL | Status: DC

## 2015-01-12 MED ORDER — CIPROFLOXACIN HCL 500 MG PO TABS: 500.0000 mg | ORAL_TABLET | Freq: Two times a day (BID) | ORAL | Status: DC

## 2015-01-12 NOTE — Telephone Encounter (Signed)
Per Dr. Warrick Parisian, contact patient and have her call him so that he may discuss labwork with her.  I contacted patient and left a message for her to call back.

## 2015-01-12 NOTE — Progress Notes (Signed)
BP 133/65 mmHg  Pulse 64  Temp(Src) 97.5 F (36.4 C) (Oral)  Ht $R'5\' 7"'cK$  (1.702 m)  Wt 233 lb 9.6 oz (105.96 kg)  BMI 36.58 kg/m2   Subjective:    Patient ID: Lorraine Leonard, female    DOB: [birth year], 74 y.o.   MRN: [id number]  HPI: Lorraine Leonard is a 74 y.o. female presenting on 01/12/2015 for Abdominal Pain; Gastrophageal Reflux; and Constipation   HPI Abdominal pain Patient has epigastric and left-sided abdominal pain that has been worsening over the past couple weeks. She denies fevers or chills but does admit to having significant nausea, vomiting. Over the past couple days she has been having difficulty holding her food down. She has occasional diarrhea and occasional constipation intermittently. She denies any blood in her stool or blood in her vomit. The abdominal pain does not radiate into her back or anywhere else. She denies any difficulties with urination or pain with urination or blood in urine. She denies any vaginal bleeding or vaginal discharge or vaginal pain.  Relevant past medical, surgical, family and social history reviewed and updated as indicated. Interim medical history since our last visit reviewed. Allergies and medications reviewed and updated.  Review of Systems  Constitutional: Positive for fatigue. Negative for fever and chills.  HENT: Negative for congestion, ear discharge and ear pain.   Eyes: Negative for redness and visual disturbance.  Respiratory: Negative for chest tightness and shortness of breath.   Cardiovascular: Negative for chest pain and leg swelling.  Gastrointestinal: Positive for nausea, vomiting, abdominal pain, diarrhea and constipation. Negative for blood in stool, abdominal distention and anal bleeding.  Genitourinary: Negative for dysuria, urgency, hematuria, decreased urine volume, vaginal bleeding, vaginal discharge, difficulty urinating, vaginal pain and pelvic pain.  Musculoskeletal: Negative for back pain and gait problem.  Skin:  Negative for rash.  Neurological: Negative for dizziness, light-headedness and headaches.  Psychiatric/Behavioral: Negative for behavioral problems and agitation.  All other systems reviewed and are negative.   Per HPI unless specifically indicated above     Medication List       This list is accurate as of: 01/12/15  9:06 AM.  Always use your most recent med list.               aspirin 81 MG tablet  Take 81 mg by mouth daily.     azelastine 0.1 % nasal spray  Commonly known as:  ASTELIN  PLACE 2 SPRAYS INTO BOTH NOSTRILS 2 (TWO) TIMES DAILY AS NEEDED.     calcium citrate 950 MG tablet  Commonly known as:  CALCITRATE - dosed in mg elemental calcium  Take 1 tablet by mouth daily.     conjugated estrogens vaginal cream  Commonly known as:  PREMARIN  Place 0.5 g vaginally daily. Use as directed     cyanocobalamin 500 MCG tablet  Take 2,000 mcg by mouth daily.     diazepam 5 MG tablet  Commonly known as:  VALIUM  TAKE 1 TABLET 2 TIMES A DAY AS NEEDED FOR ANXIETY     esomeprazole 40 MG capsule  Commonly known as:  NEXIUM  TAKE 1 CAPSULE (40 MG TOTAL) BY MOUTH DAILY BEFORE BREAKFAST.     levothyroxine 88 MCG tablet  Commonly known as:  SYNTHROID, LEVOTHROID  Take 1 tablet (88 mcg total) by mouth daily before breakfast.     mometasone 0.1 % cream  Commonly known as:  ELOCON  APPLY TO AFFECTED AREA DAILY  moxifloxacin 0.5 % ophthalmic solution  Commonly known as:  VIGAMOX  Place 1 drop into the right eye See admin instructions. Pt takes three times daily for 2 days after eye injection     ondansetron 8 MG tablet  Commonly known as:  ZOFRAN  TAKE 1 TABLET EVERY 8 HOURS AS NEEDED FOR NAUSEA     PATADAY 0.2 % Soln  Generic drug:  Olopatadine HCl  Place 1 drop into both eyes daily.     polyethylene glycol powder powder  Commonly known as:  GLYCOLAX/MIRALAX  Take 17 g by mouth 2 (two) times daily as needed.     predniSONE 10 MG tablet  Commonly known as:   DELTASONE  1 tablet 4 times a day for 2 days,  1 tablet 3 times a day for 2 days,  1 tablet 2 times a day for 2 days, 1 tablet daily for 2 days     PROAIR HFA 108 (90 BASE) MCG/ACT inhaler  Generic drug:  albuterol  INHALE 2 PUFFS INTO THE LUNGS EVERY 6 (SIX) HOURS AS NEEDED FOR WHEEZING.     traMADol 50 MG tablet  Commonly known as:  ULTRAM  TAKE 1 TABLET EVERY 6 HOURS AS NEEDED     trimethoprim 100 MG tablet  Commonly known as:  TRIMPEX  Take 100 mg by mouth daily.     valsartan-hydrochlorothiazide 160-25 MG per tablet  Commonly known as:  DIOVAN HCT  Take 1 tablet by mouth daily.     vitamin C 500 MG tablet  Commonly known as:  ASCORBIC ACID  Take 500 mg by mouth daily.     Vitamin D 2000 UNITS Caps  Take 1 capsule by mouth daily.     vitamin E 400 UNIT capsule  Take 400 Units by mouth daily.     VOLTAREN 1 % Gel  Generic drug:  diclofenac sodium  APPLY 2 GRAMS TOPICALLY 4 (FOUR) TIMES DAILY.           Objective:    BP 133/65 mmHg  Pulse 64  Temp(Src) 97.5 F (36.4 C) (Oral)  Ht $R'5\' 7"'fI$  (1.702 m)  Wt 233 lb 9.6 oz (105.96 kg)  BMI 36.58 kg/m2  Wt Readings from Last 3 Encounters:  01/12/15 233 lb 9.6 oz (105.96 kg)  12/13/14 233 lb (105.688 kg)  11/28/14 236 lb 9.6 oz (107.321 kg)    Physical Exam  Constitutional: She is oriented to person, place, and time. Vital signs are normal. She appears ill. No distress.  Eyes: Conjunctivae and EOM are normal. Pupils are equal, round, and reactive to light.  Cardiovascular: Normal rate, regular rhythm, normal heart sounds and intact distal pulses.   No murmur heard. Pulmonary/Chest: Effort normal and breath sounds normal. No respiratory distress. She has no wheezes.  Abdominal: Soft. Bowel sounds are normal. She exhibits no distension and no mass. There is no hepatosplenomegaly. There is tenderness in the right upper quadrant, epigastric area and left upper quadrant. There is no rigidity, no rebound, no guarding, no CVA  tenderness and negative Murphy's sign. No hernia.  Musculoskeletal: Normal range of motion. She exhibits no edema or tenderness.  Neurological: She is alert and oriented to person, place, and time. Coordination normal.  Skin: Skin is warm and dry. No rash noted. She is not diaphoretic.  Psychiatric: She has a normal mood and affect. Her behavior is normal.  Vitals reviewed.   Results for orders placed or performed in visit on 11/28/14  CMP14+EGFR  Result  Value Ref Range   Glucose 112 (H) 65 - 99 mg/dL   BUN 14 8 - 27 mg/dL   Creatinine, Ser 1.46 0.57 - 1.00 mg/dL   GFR calc non Af Amer 56 (L) >59 mL/min/1.73   GFR calc Af Amer 65 >59 mL/min/1.73   BUN/Creatinine Ratio 14 11 - 26   Sodium 143 134 - 144 mmol/L   Potassium 4.8 3.5 - 5.2 mmol/L   Chloride 99 97 - 108 mmol/L   CO2 28 18 - 29 mmol/L   Calcium 10.3 8.7 - 10.3 mg/dL   Total Protein 7.5 6.0 - 8.5 g/dL   Albumin 4.3 3.5 - 4.8 g/dL   Globulin, Total 3.2 1.5 - 4.5 g/dL   Albumin/Globulin Ratio 1.3 1.1 - 2.5   Bilirubin Total 0.3 0.0 - 1.2 mg/dL   Alkaline Phosphatase 100 39 - 117 IU/L   AST 32 0 - 40 IU/L   ALT 21 0 - 32 IU/L  Lipid panel  Result Value Ref Range   Cholesterol, Total 149 100 - 199 mg/dL   Triglycerides 431 (H) 0 - 149 mg/dL   HDL 38 (L) >42 mg/dL   VLDL Cholesterol Cal 39 5 - 40 mg/dL   LDL Calculated 72 0 - 99 mg/dL   Chol/HDL Ratio 3.9 0.0 - 4.4 ratio units  TSH  Result Value Ref Range   TSH 1.940 0.450 - 4.500 uIU/mL  T4, Free  Result Value Ref Range   Free T4 1.21 0.82 - 1.77 ng/dL  CBC with Differential/Platelet  Result Value Ref Range   WBC 11.1 (H) 3.4 - 10.8 x10E3/uL   RBC 4.69 3.77 - 5.28 x10E6/uL   Hemoglobin 13.4 11.1 - 15.9 g/dL   Hematocrit 76.7 01.1 - 46.6 %   MCV 87 79 - 97 fL   MCH 28.6 26.6 - 33.0 pg   MCHC 32.8 31.5 - 35.7 g/dL   RDW 00.3 49.6 - 11.6 %   Platelets 308 150 - 379 x10E3/uL   Neutrophils 69 %   Lymphs 23 %   Monocytes 7 %   Eos 1 %   Basos 0 %    Neutrophils Absolute 7.6 (H) 1.4 - 7.0 x10E3/uL   Lymphocytes Absolute 2.6 0.7 - 3.1 x10E3/uL   Monocytes Absolute 0.7 0.1 - 0.9 x10E3/uL   EOS (ABSOLUTE) 0.2 0.0 - 0.4 x10E3/uL   Basophils Absolute 0.0 0.0 - 0.2 x10E3/uL   Immature Granulocytes 0 %   Immature Grans (Abs) 0.0 0.0 - 0.1 x10E3/uL  POCT UA - Microscopic Only  Result Value Ref Range   WBC, Ur, HPF, POC 1-5    RBC, urine, microscopic occ    Bacteria, U Microscopic occ    Mucus, UA negative    Epithelial cells, urine per micros occ    Crystals, Ur, HPF, POC negative    Casts, Ur, LPF, POC negative    Yeast, UA negative   POCT urinalysis dipstick  Result Value Ref Range   Color, UA gold    Clarity, UA clear    Glucose, UA negative    Bilirubin, UA negative    Ketones, UA negative    Spec Grav, UA 1.015    Blood, UA negative    pH, UA 5.0    Protein, UA negative    Urobilinogen, UA negative    Nitrite, UA negative    Leukocytes, UA Trace (A) Negative      Assessment & Plan:   Problem List Items Addressed This Visit  None    Visit Diagnoses    Abdominal pain, epigastric    -  Primary    pt has intense epigastric abd pain and appears very ill. gave GI cocktail and got stat labs and CT abd. had u/s in april for gallbladder that was normal.    Relevant Orders    CT Abdomen Pelvis W Contrast    CBC with Differential/Platelet    Lipase    CMP14+EGFR    GI COCKTAIL UP TO 45 CC        CT abdomen was ordered stat and update shows that the CT abdomen showed diverticulitis and Cipro and Flagyl were called in for her.  Follow up plan: Return in about 1 week (around 01/19/2015), or if symptoms worsen or fail to improve.  Caryl Pina, MD [facility] 01/12/2015, 9:06 AM

## 2015-01-12 NOTE — Addendum Note (Signed)
Addended by: Michaela Corner on: 01/12/2015 02:38 PM   Modules accepted: Orders

## 2015-01-12 NOTE — Telephone Encounter (Signed)
Final report of kidney function came in, per Dr. Warrick Parisian patient needs to make sure she drinks plenty of fluids.  Contacted patient and informed her of results and need to hydrate.

## 2015-01-15 LAB — CBC WITH DIFFERENTIAL/PLATELET
Basophils Absolute: 0 10*3/uL (ref 0.0–0.2)
Basos: 0 %
EOS (ABSOLUTE): 0.2 10*3/uL (ref 0.0–0.4)
Eos: 3 %
Hematocrit: 41.3 % (ref 34.0–46.6)
Hemoglobin: 13.6 g/dL (ref 11.1–15.9)
Immature Grans (Abs): 0 10*3/uL (ref 0.0–0.1)
Immature Granulocytes: 0 %
Lymphocytes Absolute: 2.7 10*3/uL (ref 0.7–3.1)
Lymphs: 33 %
MCH: 28.9 pg (ref 26.6–33.0)
MCHC: 32.9 g/dL (ref 31.5–35.7)
MCV: 88 fL (ref 79–97)
Monocytes Absolute: 0.7 10*3/uL (ref 0.1–0.9)
Monocytes: 8 %
Neutrophils Absolute: 4.4 10*3/uL (ref 1.4–7.0)
Neutrophils: 56 %
Platelets: 325 10*3/uL (ref 150–379)
RBC: 4.7 x10E6/uL (ref 3.77–5.28)
RDW: 14.4 % (ref 12.3–15.4)
WBC: 8 10*3/uL (ref 3.4–10.8)

## 2015-01-16 ENCOUNTER — Telehealth: Payer: Self-pay | Admitting: Family Medicine

## 2015-01-16 MED ORDER — FLUCONAZOLE 150 MG PO TABS: 150.0000 mg | ORAL_TABLET | Freq: Once | ORAL | Status: DC

## 2015-01-16 NOTE — Telephone Encounter (Signed)
Diflucan scrip sent

## 2015-01-24 ENCOUNTER — Encounter: Payer: Self-pay | Admitting: Family Medicine

## 2015-01-24 ENCOUNTER — Ambulatory Visit (INDEPENDENT_AMBULATORY_CARE_PROVIDER_SITE_OTHER): Payer: Medicare Other | Admitting: Family Medicine

## 2015-01-24 ENCOUNTER — Telehealth: Payer: Self-pay | Admitting: Family Medicine

## 2015-01-24 VITALS — BP 147/71 | HR 78 | Temp 97.9°F | Ht 67.0 in | Wt 237.4 lb

## 2015-01-24 DIAGNOSIS — N189 Chronic kidney disease, unspecified: Secondary | ICD-10-CM

## 2015-01-24 DIAGNOSIS — N179 Acute kidney failure, unspecified: Secondary | ICD-10-CM

## 2015-01-24 DIAGNOSIS — M25512 Pain in left shoulder: Secondary | ICD-10-CM | POA: Diagnosis not present

## 2015-01-24 DIAGNOSIS — S161XXA Strain of muscle, fascia and tendon at neck level, initial encounter: Secondary | ICD-10-CM | POA: Diagnosis not present

## 2015-01-24 DIAGNOSIS — Z23 Encounter for immunization: Secondary | ICD-10-CM | POA: Diagnosis not present

## 2015-01-24 NOTE — Progress Notes (Signed)
BP 147/71 mmHg  Pulse 78  Temp(Src) 97.9 F (36.6 C) (Oral)  Ht 5\' 7"  (1.702 m)  Wt 237 lb 6.4 oz (107.684 kg)  BMI 37.17 kg/m2   Subjective:    Patient ID: Lorraine Leonard, female    DOB: 09-03-1940, 74 y.o.   MRN: [id number]  HPI: Lorraine Leonard is a 74 y.o. female presenting on 01/24/2015 for Neck Pain   HPI Neck strain Patient presents today for new next rain/pain on the right side of her neck that developed after she was reading a magazine and leaning to one side for some time. The pain is right-sided neck and causes her difficulty to turn right. She has general muscle soreness that recurs frequently like this.  Shoulder pain She has continued shoulder pain that is worse on her left shoulder despite previous injection. 2 like to go to physical therapy for both. She also has the rheumatic arthritis and this may be associated with that.  Acute kidney change Her last creatinine came back 1.2 which was elevated from previous 1.1 range. Encouraged avoiding NSAIDs and increasing fluid intake. We will recheck that today.  Relevant past medical, surgical, family and social history reviewed and updated as indicated. Interim medical history since our last visit reviewed. Allergies and medications reviewed and updated.  Review of Systems  Constitutional: Negative for fever and chills.  HENT: Negative for congestion, ear discharge and ear pain.   Eyes: Negative for redness and visual disturbance.  Respiratory: Negative for chest tightness and shortness of breath.   Cardiovascular: Negative for chest pain and leg swelling.  Genitourinary: Negative for dysuria and difficulty urinating.  Musculoskeletal: Positive for arthralgias, neck pain and neck stiffness. Negative for back pain and gait problem.  Skin: Negative for color change and rash.  Neurological: Negative for light-headedness and headaches.  Psychiatric/Behavioral: Negative for behavioral problems and agitation.  All other  systems reviewed and are negative.   Per HPI unless specifically indicated above     Medication List       This list is accurate as of: 01/24/15 11:40 AM.  Always use your most recent med list.               aspirin 81 MG tablet  Take 81 mg by mouth daily.     azelastine 0.1 % nasal spray  Commonly known as:  ASTELIN  PLACE 2 SPRAYS INTO BOTH NOSTRILS 2 (TWO) TIMES DAILY AS NEEDED.     calcium citrate 950 MG tablet  Commonly known as:  CALCITRATE - dosed in mg elemental calcium  Take 1 tablet by mouth daily.     ciprofloxacin 500 MG tablet  Commonly known as:  CIPRO  Take 1 tablet (500 mg total) by mouth 2 (two) times daily.     conjugated estrogens vaginal cream  Commonly known as:  PREMARIN  Place 0.5 g vaginally daily. Use as directed     cyanocobalamin 500 MCG tablet  Take 2,000 mcg by mouth daily.     diazepam 5 MG tablet  Commonly known as:  VALIUM  TAKE 1 TABLET 2 TIMES A DAY AS NEEDED FOR ANXIETY     esomeprazole 40 MG capsule  Commonly known as:  NEXIUM  TAKE 1 CAPSULE (40 MG TOTAL) BY MOUTH DAILY BEFORE BREAKFAST.     fluconazole 150 MG tablet  Commonly known as:  DIFLUCAN  Take 1 tablet (150 mg total) by mouth once.     levothyroxine 88 MCG tablet  Commonly known as:  SYNTHROID, LEVOTHROID  Take 1 tablet (88 mcg total) by mouth daily before breakfast.     metroNIDAZOLE 500 MG tablet  Commonly known as:  FLAGYL  Take 1 tablet (500 mg total) by mouth 2 (two) times daily.     mometasone 0.1 % cream  Commonly known as:  ELOCON  APPLY TO AFFECTED AREA DAILY     moxifloxacin 0.5 % ophthalmic solution  Commonly known as:  VIGAMOX  Place 1 drop into the right eye See admin instructions. Pt takes three times daily for 2 days after eye injection     ondansetron 8 MG tablet  Commonly known as:  ZOFRAN  TAKE 1 TABLET EVERY 8 HOURS AS NEEDED FOR NAUSEA     PATADAY 0.2 % Soln  Generic drug:  Olopatadine HCl  Place 1 drop into both eyes daily.      polyethylene glycol powder powder  Commonly known as:  GLYCOLAX/MIRALAX  Take 17 g by mouth 2 (two) times daily as needed.     predniSONE 10 MG tablet  Commonly known as:  DELTASONE  1 tablet 4 times a day for 2 days,  1 tablet 3 times a day for 2 days,  1 tablet 2 times a day for 2 days, 1 tablet daily for 2 days     PROAIR HFA 108 (90 BASE) MCG/ACT inhaler  Generic drug:  albuterol  INHALE 2 PUFFS INTO THE LUNGS EVERY 6 (SIX) HOURS AS NEEDED FOR WHEEZING.     traMADol 50 MG tablet  Commonly known as:  ULTRAM  TAKE 1 TABLET EVERY 6 HOURS AS NEEDED     trimethoprim 100 MG tablet  Commonly known as:  TRIMPEX  Take 100 mg by mouth daily.     valsartan-hydrochlorothiazide 160-25 MG tablet  Commonly known as:  DIOVAN HCT  Take 1 tablet by mouth daily.     vitamin C 500 MG tablet  Commonly known as:  ASCORBIC ACID  Take 500 mg by mouth daily.     Vitamin D 2000 UNITS Caps  Take 1 capsule by mouth daily.     vitamin E 400 UNIT capsule  Take 400 Units by mouth daily.     VOLTAREN 1 % Gel  Generic drug:  diclofenac sodium  APPLY 2 GRAMS TOPICALLY 4 (FOUR) TIMES DAILY.           Objective:    BP 147/71 mmHg  Pulse 78  Temp(Src) 97.9 F (36.6 C) (Oral)  Ht $R'5\' 7"'KY$  (1.702 m)  Wt 237 lb 6.4 oz (107.684 kg)  BMI 37.17 kg/m2  Wt Readings from Last 3 Encounters:  01/24/15 237 lb 6.4 oz (107.684 kg)  01/12/15 233 lb 9.6 oz (105.96 kg)  12/13/14 233 lb (105.688 kg)    Physical Exam  Constitutional: She is oriented to person, place, and time. She appears well-developed and well-nourished. No distress.  Eyes: Conjunctivae and EOM are normal.  Neck: Neck supple. Muscular tenderness (right-sided muscular tenderness) present. No rigidity. Decreased range of motion (Decreased range of motion from pain) present. No edema and no erythema present. No thyromegaly present.  Cardiovascular: Normal rate, regular rhythm, normal heart sounds and intact distal pulses.   No murmur  heard. Pulmonary/Chest: Effort normal and breath sounds normal. No respiratory distress. She has no wheezes.  Abdominal: Soft. Bowel sounds are normal. She exhibits no distension. There is no tenderness.  Musculoskeletal: She exhibits no edema.       Left shoulder: She exhibits  tenderness. She exhibits normal range of motion, no swelling, no effusion, no crepitus and normal strength.  Lymphadenopathy:    She has no cervical adenopathy.  Neurological: She is alert and oriented to person, place, and time. She has normal reflexes. No cranial nerve deficit. Coordination normal.  Skin: Skin is warm and dry. No rash noted. She is not diaphoretic.  Psychiatric: She has a normal mood and affect. Her behavior is normal. Judgment and thought content normal.  Vitals reviewed.   Results for orders placed or performed during the hospital encounter of 01/12/15  I-STAT creatinine  Result Value Ref Range   Creatinine, Ser 1.20 (H) 0.44 - 1.00 mg/dL      Assessment & Plan:   Problem List Items Addressed This Visit    None    Visit Diagnoses    Neck strain, initial encounter    -  Primary    We'll refer to physical therapy for right neck strain.    Relevant Orders    Ambulatory referral to Physical Therapy    Left shoulder pain        We'll also send to physical therapy. Kidney function is up so can't take anti-inflammatories. Continue tramadol    Relevant Orders    Ambulatory referral to Physical Therapy    Acute on chronic kidney failure [facility])        Patient also has one kidney and had elevated creatinine at last visit of 1.20. We'll recheck today and continue to avoid anti-inflammatories and stay well-hydra    Relevant Orders    BMP8+EGFR        Follow up plan: Return in about 2 years (around 01/23/2017), or if symptoms worsen or fail to improve, for Hypertension follow-up.  Caryl Pina, MD [facility] 01/24/2015, 11:40 AM

## 2015-01-24 NOTE — Telephone Encounter (Signed)
Patient's creatinine level checked today, results will not be back for 1-2 more days.  I contacted patient's voicemail and left her a message letting her know this and that we will call with results and she may call back with any questions.

## 2015-01-25 LAB — BMP8+EGFR
BUN/Creatinine Ratio: 9 — ABNORMAL LOW (ref 11–26)
BUN: 9 mg/dL (ref 8–27)
CO2: 23 mmol/L (ref 18–29)
Calcium: 9.7 mg/dL (ref 8.7–10.3)
Chloride: 97 mmol/L (ref 97–108)
Creatinine, Ser: 0.95 mg/dL (ref 0.57–1.00)
GFR calc Af Amer: 68 mL/min/{1.73_m2} (ref >59)
GFR calc non Af Amer: 59 mL/min/{1.73_m2} — ABNORMAL LOW (ref >59)
Glucose: 169 mg/dL — ABNORMAL HIGH (ref 65–99)
Potassium: 4.2 mmol/L (ref 3.5–5.2)
Sodium: 140 mmol/L (ref 134–144)

## 2015-01-30 NOTE — Progress Notes (Signed)
Patient aware.

## 2015-02-01 ENCOUNTER — Ambulatory Visit: Payer: Medicare Other | Attending: Family Medicine | Admitting: Physical Therapy

## 2015-02-01 DIAGNOSIS — M25512 Pain in left shoulder: Secondary | ICD-10-CM | POA: Insufficient documentation

## 2015-02-01 DIAGNOSIS — M542 Cervicalgia: Secondary | ICD-10-CM | POA: Insufficient documentation

## 2015-02-01 NOTE — Therapy (Signed)
[facility] [place], [place], [address] Phone: [phone]   Fax:  [phone]  Physical Therapy Evaluation  Patient Details  Name: Lorraine Leonard MRN: [id number] Date of Birth: 1940/07/25 Referring Provider:  Dettinger, Fransisca Kaufmann, MD  Encounter Date: 02/01/2015      PT End of Session - 02/01/15 1359    Visit Number 1   Number of Visits 18   Date for PT Re-Evaluation 03/22/15   PT Start Time 0117   PT Stop Time 0202   PT Time Calculation (min) 45 min   Activity Tolerance Patient tolerated treatment well   Behavior During Therapy [facility] for tasks assessed/performed      Past Medical History  Diagnosis Date  . Hypertension   . Diabetes mellitus     pre-diabetic  . Allergic rhinitis   . Urticaria   . GERD (gastroesophageal reflux disease)   . 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 Diverticulosis   . Macular degeneration   . Depression   . Hypothyroidism   . Anxiety   . Anxiety   . Rheumatoid arthritis(714.0)   . Hyperlipidemia     Past Surgical History  Procedure Laterality Date  . Lumbar disc surgery      x 2  . 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 Vaginal hysterectomy    . Tubal ligation      There were no vitals filed for this visit.  Visit Diagnosis:  Neck pain - Plan: PT plan of care cert/re-cert  Left shoulder pain - Plan: PT plan of care cert/re-cert          [facility] PT Assessment - 02/01/15 0001    Assessment   Medical Diagnosis Neck pain and left shoulder pain.   Onset Date/Surgical Date --  08/18/14.   Precautions   Precautions None   Balance Screen   Has the patient fallen in the past 6 months Yes   How many times? --  2   Has the patient had a decrease in activity level because of a fear of falling?  Yes   Is the patient reluctant to leave their home because of a fear of falling?  Yes   [place] residence   Prior Function   Level of  Independence Independent   Cognition   Overall Cognitive Status Within Functional Limits for tasks assessed   Posture/Postural Control   Posture/Postural Control Postural limitations   Postural Limitations Rounded Shoulders;Forward head   ROM / Strength   AROM / PROM / Strength AROM;Strength   AROM   Overall AROM Comments Right cervical rotation= 48 degrees and 58 degrees to left.   Strength   Overall Strength Comments Left shoulder abduction 3+/5 limited at least in part due to pain.   Palpation   Palpation comment Tender over left UT and right shoulder acromial ridge.  She has a bruise over her right middle deltoid due to a flu shot and she incidentally reports right shoulder pain she staes due to overuse due to left shoulder pain and decreased use.   Special Tests    Special Tests Rotator Cuff Impingement  Normal bilateral UE DTR's.   Rotator Cuff Impingment tests Neer impingement test;Hawkins- Kennedy test   Neer Impingement test    Findings Positive   Side Left   Hawkins-Kennedy test   Findings Positive   Side Left  [facility] PT Treatment/Exercise - 2015/02/24 0001    Modalities   Modalities Electrical Stimulation   Electrical Stimulation   Electrical Stimulation Location Left UT and shoulder   Electrical Stimulation Action 100% scan IFC x 15 minutes 80-150 HZ.   Electrical Stimulation Goals Pain                  PT Short Term Goals - 2015-02-24 1347    PT SHORT TERM GOAL #1   Title Ind with HEP.   Time 2   Period Weeks   Status New           PT Long Term Goals - 02-24-2015 1347    PT LONG TERM GOAL #1   Title Bilateral active cervical rotation= 60-65 degrees so she can turn her head more easily while driving.   Time 6   Period Weeks   Status New   PT LONG TERM GOAL #2   Title Perform ADL's wiht pain not > 3/10.   Time 6   Period Weeks   Status New   PT LONG TERM GOAL #3   Title Perform left shoulder functional  antigravity movements with pain not > 3/10.               Plan - 02/24/15 1324    Clinical Impression Statement The patient tripped and fell on 08/18/14.  She fell forward on her hands and had to crawl to get to a point where she could pull herself up.  She has bilateral neck pain left > right and left shoulder pain rated at 7-8/10.  Turning of head increases pain.   Pt will benefit from skilled therapeutic intervention in order to improve on the following deficits Pain;Decreased activity tolerance   Rehab Potential Good   PT Frequency 3x / week   PT Duration 6 weeks   PT Treatment/Interventions ADLs/Self Care Home Management;Cryotherapy;Electrical Stimulation;Moist Heat;Therapeutic exercise;Therapeutic activities;Ultrasound;Patient/family education;Manual techniques;Passive range of motion   PT Next Visit Plan Left UT STW/M; Combo E'stim/U/S; Mckenzie neck exercises; left shoulder RW4.   Consulted and Agree with Plan of Care Patient          G-Codes - 02-24-2015 1356    Functional Assessment Tool Used FOTO.   Functional Limitation Mobility: Walking and moving around   Mobility: Walking and Moving Around Current Status [phone]) At least 20 percent but less than 40 percent impaired, limited or restricted   Mobility: Walking and Moving Around Goal Status [phone]) At least 1 percent but less than 20 percent impaired, limited or restricted       Problem List Patient Active Problem List   Diagnosis Date Noted  . Macular degeneration of both eyes 11/28/2014  . Constipation 11/28/2014  . Chronic rhinitis 11/17/2014  . Hypertensive retinopathy 10/30/2014  . Vitamin D deficiency 08/17/2014  . Arthritis of knee 08/17/2014  . Hyperlipemia 08/17/2014  . Depression   . Hypothyroidism   . Anxiety   . Rheumatoid arthritis ([place])   . Hyperlipidemia   . Cardiomegaly 05/05/2011  . Essential hypertension 02/08/2007  . 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  GASTROESOPHAGEAL REFLUX DISEASE 02/08/2007    Sayf Kerner, [place] MPT 02-24-2015, 3:08 PM  [facility] [address] [place], [place], [address] Phone: [phone]   Fax:  [phone]

## 2015-02-06 ENCOUNTER — Ambulatory Visit: Payer: Medicare Other | Admitting: *Deleted

## 2015-02-06 ENCOUNTER — Encounter: Payer: Self-pay | Admitting: *Deleted

## 2015-02-06 DIAGNOSIS — M25512 Pain in left shoulder: Secondary | ICD-10-CM | POA: Diagnosis not present

## 2015-02-06 DIAGNOSIS — M542 Cervicalgia: Secondary | ICD-10-CM

## 2015-02-06 NOTE — Therapy (Signed)
[facility] [place], [place], [address] Phone: [phone]   Fax:  [phone]  Physical Therapy Treatment  Patient Details  Name: Lorraine Leonard MRN: [id number] Date of Birth: 30-Nov-1940 No Data Recorded  Encounter Date: 02/06/2015      PT End of Session - 02/06/15 1358    Visit Number 2   Number of Visits 18   Date for PT Re-Evaluation 03/22/15   PT Start Time 1300   PT Stop Time 2202   PT Time Calculation (min) 49 min      Past Medical History  Diagnosis Date  . Hypertension   . Diabetes mellitus     pre-diabetic  . Allergic rhinitis   . Urticaria   . GERD (gastroesophageal reflux disease)   . 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 Diverticulosis   . Macular degeneration   . Depression   . Hypothyroidism   . Anxiety   . Anxiety   . Rheumatoid arthritis(714.0)   . Hyperlipidemia     Past Surgical History  Procedure Laterality Date  . Lumbar disc surgery      x 2  . 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 Vaginal hysterectomy    . Tubal ligation      There were no vitals filed for this visit.  Visit Diagnosis:  Left shoulder pain  Neck pain      Subjective Assessment - 02/06/15 1307    Subjective Did goo after last Rx. Just sore today   Limitations Standing;Sitting   Currently in Pain? Yes   Pain Score 2    Pain Location Neck   Pain Orientation Right;Left   Pain Type Chronic pain   Pain Frequency Intermittent   Aggravating Factors  turning my neck and Lt shldr pushing up   Pain Relieving Factors heat                         [facility] Adult PT Treatment/Exercise - 02/06/15 0001    Exercises   Exercises Neck;Shoulder   Neck Exercises: Standing   Neck Retraction 10 reps   Neck Retraction Limitations needs Verbal and tactile cues   Modalities   Modalities Ultrasound;Retail [employer] Location Left UT and shoulder IFC  x15 mins in sitting   Electrical Stimulation Goals Pain   Ultrasound   Ultrasound Location LT/RT UT   Ultrasound Parameters 1.5 w/cm2 x 10 mins   Ultrasound Goals Pain   Manual Therapy   Manual Therapy Soft tissue mobilization;Myofascial release   Myofascial Release IASTM to BIL Utraps and cerv paras                  PT Short Term Goals - 02/01/15 1347    PT SHORT TERM GOAL #1   Title Ind with HEP.   Time 2   Period Weeks   Status New           PT Long Term Goals - 02/01/15 1347    PT LONG TERM GOAL #1   Title Bilateral active cervical rotation= 60-65 degrees so she can turn her head more easily while driving.   Time 6   Period Weeks   Status New   PT LONG TERM GOAL #2   Title Perform ADL's wiht pain not > 3/10.   Time 6   Period Weeks   Status New   PT LONG  TERM GOAL #3   Title Perform left shoulder functional antigravity movements with pain not > 3/10.               Plan - 02/06/15 1359    Clinical Impression Statement Pt did very well with Rx today and had less pain and tightness after Rx. She needs to review Chin tucks again for technique.   Pt will benefit from skilled therapeutic intervention in order to improve on the following deficits Pain;Decreased activity tolerance   Rehab Potential Good   PT Frequency 3x / week   PT Duration 6 weeks   PT Treatment/Interventions ADLs/Self Care Home Management;Cryotherapy;Electrical Stimulation;Moist Heat;Therapeutic exercise;Therapeutic activities;Ultrasound;Patient/family education;Manual techniques;Passive range of motion   PT Next Visit Plan Left UT STW/M; Combo E'stim/U/S; Mckenzie neck exercises; left shoulder RW4.   Consulted and Agree with Plan of Care Patient        Problem List Patient Active Problem List   Diagnosis Date Noted  . Macular degeneration of both eyes 11/28/2014  . Constipation 11/28/2014  . Chronic rhinitis 11/17/2014  . Hypertensive retinopathy 10/30/2014  . Vitamin D  deficiency 08/17/2014  . Arthritis of knee 08/17/2014  . Hyperlipemia 08/17/2014  . Depression   . Hypothyroidism   . Anxiety   . Rheumatoid arthritis ([place])   . Hyperlipidemia   . Cardiomegaly 05/05/2011  . Essential hypertension 02/08/2007  . 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 GASTROESOPHAGEAL REFLUX DISEASE 02/08/2007    Leola Fiore,CHRIS, PTA 02/06/2015, 2:01 PM  [facility] [place], [place], [address] Phone: [phone]   Fax:  [phone]  Name: Lorraine Leonard MRN: [id number] Date of Birth: 1940/05/23

## 2015-02-08 ENCOUNTER — Ambulatory Visit: Payer: Medicare Other | Admitting: Physical Therapy

## 2015-02-08 DIAGNOSIS — M25512 Pain in left shoulder: Secondary | ICD-10-CM | POA: Diagnosis not present

## 2015-02-08 DIAGNOSIS — M542 Cervicalgia: Secondary | ICD-10-CM | POA: Diagnosis not present

## 2015-02-08 NOTE — Therapy (Signed)
[facility] [place], [place], [address] Phone: [phone]   Fax:  [phone]  Physical Therapy Treatment  Patient Details  Name: GAYLIN OSORIA MRN: [id number] Date of Birth: [birth year] No Data Recorded  Encounter Date: 02/08/2015      PT End of Session - 02/08/15 1354    Visit Number 3   Number of Visits 18   Date for PT Re-Evaluation 03/22/15   PT Start Time 8502   PT Stop Time 7741   PT Time Calculation (min) 48 min   Activity Tolerance Patient tolerated treatment well   Behavior During Therapy [facility] for tasks assessed/performed      Past Medical History  Diagnosis Date  . Hypertension   . Diabetes mellitus     pre-diabetic  . Allergic rhinitis   . Urticaria   . GERD (gastroesophageal reflux disease)   . 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 Diverticulosis   . Macular degeneration   . Depression   . Hypothyroidism   . Anxiety   . Anxiety   . Rheumatoid arthritis(714.0)   . Hyperlipidemia     Past Surgical History  Procedure Laterality Date  . Lumbar disc surgery      x 2  . 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 Vaginal hysterectomy    . Tubal ligation      There were no vitals filed for this visit.  Visit Diagnosis:  Left shoulder pain  Neck pain      Subjective Assessment - 02/08/15 1350    Subjective Left shoulder hurting.   Pain Score 4    Pain Location Shoulder   Pain Orientation Right;Left   Pain Type Chronic pain   Pain Frequency Intermittent                                   PT Short Term Goals - 02/01/15 1347    PT SHORT TERM GOAL #1   Title Ind with HEP.   Time 2   Period Weeks   Status New           PT Long Term Goals - 02/01/15 1347    PT LONG TERM GOAL #1   Title Bilateral active cervical rotation= 60-65 degrees so she can turn her head more easily while driving.   Time 6   Period Weeks   Status New   PT LONG TERM GOAL #2   Title Perform ADL's wiht pain not > 3/10.   Time 6   Period Weeks   Status New   PT LONG TERM GOAL #3   Title Perform left shoulder functional antigravity movements with pain not > 3/10.               Problem List Patient Active Problem List   Diagnosis Date Noted  . Macular degeneration of both eyes 11/28/2014  . Constipation 11/28/2014  . Chronic rhinitis 11/17/2014  . Hypertensive retinopathy 10/30/2014  . Vitamin D deficiency 08/17/2014  . Arthritis of knee 08/17/2014  . Hyperlipemia 08/17/2014  . Depression   . Hypothyroidism   . Anxiety   . Rheumatoid arthritis ([place])   . Hyperlipidemia   . Cardiomegaly 05/05/2011  . Essential hypertension 02/08/2007  . 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 GASTROESOPHAGEAL REFLUX DISEASE 02/08/2007   Treatment:  Pre-mod e stim constant bilateral UT's and acromial  ridge x 15 minutes f/b Combo E'stim?U/S at 1.50 /CM2 x 12 minutes (6 minutes each side) f/b STW/M x 11 minutes.  Instructed patient in yellow Theraband ER.  Give pic next visit.  Twan Harkin, [place] MPT 02/08/2015, 1:55 PM  [facility] [address] [place], [place], [address] Phone: [phone]   Fax:  [phone]  Name: CARLEA BADOUR MRN: [id number] Date of Birth: Aug 26, 1940

## 2015-02-14 ENCOUNTER — Encounter: Payer: Medicare Other | Admitting: Physical Therapy

## 2015-02-15 ENCOUNTER — Encounter: Payer: Self-pay | Admitting: Physical Therapy

## 2015-02-15 ENCOUNTER — Ambulatory Visit: Payer: Medicare Other | Admitting: Physical Therapy

## 2015-02-15 DIAGNOSIS — M25512 Pain in left shoulder: Secondary | ICD-10-CM

## 2015-02-15 DIAGNOSIS — M542 Cervicalgia: Secondary | ICD-10-CM | POA: Diagnosis not present

## 2015-02-15 NOTE — Therapy (Signed)
[facility] [place], [place], [address] Phone: [phone]   Fax:  [phone]  Physical Therapy Treatment  Patient Details  Name: Lorraine Leonard MRN: [id number] Date of Birth: 12-31-1940 No Data Recorded  Encounter Date: 02/15/2015      PT End of Session - 02/15/15 1430    Visit Number 4   Number of Visits 18   Date for PT Re-Evaluation 03/22/15   PT Start Time 5009   PT Stop Time 1444   PT Time Calculation (min) 47 min   Activity Tolerance Patient tolerated treatment well   Behavior During Therapy [facility] for tasks assessed/performed      Past Medical History  Diagnosis Date  . Hypertension   . Diabetes mellitus     pre-diabetic  . Allergic rhinitis   . Urticaria   . GERD (gastroesophageal reflux disease)   . 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 Diverticulosis   . Macular degeneration   . Depression   . Hypothyroidism   . Anxiety   . Anxiety   . Rheumatoid arthritis(714.0)   . Hyperlipidemia     Past Surgical History  Procedure Laterality Date  . Lumbar disc surgery      x 2  . 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 Vaginal hysterectomy    . Tubal ligation      There were no vitals filed for this visit.  Visit Diagnosis:  Left shoulder pain  Neck pain      Subjective Assessment - 02/15/15 1359    Subjective patient reported increase pain with movement, left side more   Limitations Standing;Sitting   Currently in Pain? Yes   Pain Score 7    Pain Location Shoulder   Pain Orientation Right;Left   Pain Type Chronic pain   Pain Onset More than a month ago   Pain Frequency Intermittent   Aggravating Factors  movement   Pain Relieving Factors rest                         [facility] Adult PT Treatment/Exercise - 02/15/15 0001    Electrical Stimulation   Electrical Stimulation Location bil UT and shoulder   Electrical Stimulation Action premod   Electrical Stimulation Parameters  80-_0    Electrical Stimulation Goals Pain   Ultrasound   Ultrasound Location bil UT area   Ultrasound Parameters 1.5w/cm2/50%/67mz x120m   Ultrasound Goals Pain   Manual Therapy   Manual Therapy Soft tissue mobilization;Myofascial release   Myofascial Release IASTM to BIL Utraps and cerv paras                PT Education - 02/15/15 1402    Education provided Yes   Education Details HEP   Person(s) Educated Patient   Methods Explanation;Demonstration;Handout   Comprehension Verbalized understanding;Returned demonstration          PT Short Term Goals - 02/15/15 1431    PT SHORT TERM GOAL #1   Title Ind with HEP.   Time 2   Period Weeks   Status Achieved  02/15/15)           PT Long Term Goals - 02/01/15 1347    PT LONG TERM GOAL #1   Title Bilateral active cervical rotation= 60-65 degrees so she can turn her head more easily while driving.   Time 6   Period Weeks   Status New   PT LONG  TERM GOAL #2   Title Perform ADL's wiht pain not > 3/10.   Time 6   Period Weeks   Status New   PT LONG TERM GOAL #3   Title Perform left shoulder functional antigravity movements with pain not > 3/10.               Plan - 02/15/15 1432    Clinical Impression Statement patient progressing with good response to treatment thus far and has little to no pain at rest and withh increase up to 7/10 with cervical rotation. HEP given to patient for posture and ER strength per MPT. Patient met STG #1, other goals ongoing due to ROM and pain deficits.    Pt will benefit from skilled therapeutic intervention in order to improve on the following deficits Pain;Decreased activity tolerance   Rehab Potential Good   PT Frequency 3x / week   PT Duration 6 weeks   PT Treatment/Interventions ADLs/Self Care Home Management;Cryotherapy;Electrical Stimulation;Moist Heat;Therapeutic exercise;Therapeutic activities;Ultrasound;Patient/family education;Manual techniques;Passive range of  motion   PT Next Visit Plan cont with POC per MPT for Left UT STW/M; Combo E'stim/U/S; Mckenzie neck exercises; left shoulder RW4.   Consulted and Agree with Plan of Care Patient        Problem List Patient Active Problem List   Diagnosis Date Noted  . Macular degeneration of both eyes 11/28/2014  . Constipation 11/28/2014  . Chronic rhinitis 11/17/2014  . Hypertensive retinopathy 10/30/2014  . Vitamin D deficiency 08/17/2014  . Arthritis of knee 08/17/2014  . Hyperlipemia 08/17/2014  . Depression   . Hypothyroidism   . Anxiety   . Rheumatoid arthritis ([place])   . Hyperlipidemia   . Cardiomegaly 05/05/2011  . Essential hypertension 02/08/2007  . 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 GASTROESOPHAGEAL REFLUX DISEASE 02/08/2007    Phillips Climes, PTA 02/15/2015, 2:45 PM  [facility] [address] [place], [place], [address] Phone: [phone]   Fax:  [phone]  Name: DONELL SLIWINSKI MRN: [id number] Date of Birth: January 06, 1941

## 2015-02-15 NOTE — Patient Instructions (Signed)
Strengthening: Resisted External Rotation   Hold tubing in right hand, elbow at side and forearm across body. Rotate forearm out. Repeat __10__ times per set. Do __2-3__ sets per session. Do ____ sessions per day. AROM: Neck Rotation   Turn head slowly to look over one shoulder, then the other. Hold each position _10___ seconds. Repeat _5___ times per set. Do __2__ sets per session. Do _2-3___ sessions per day.  http://orth.exer.us/294   Copyright  VHI. All rights reserved.  AROM: Lateral Neck Flexion   Slowly tilt head toward one shoulder, then the other. Hold each position _10___ seconds. Repeat __5__ times per set. Do __2__ sets per session. Do __2-3__ sessions per day.  http://orth.exer.us/296   Copyright  VHI. All rights reserved.  Stretch Break - Chin Tuck   Looking straight forward, tuck chin and hold __10__ seconds. Relax and return to starting position. Repeat __5-10__ times every _3-4___ hours.  Copyright  VHI. All rights reserved.  Stretch Break - Chest and Shoulder Stretch   Maintaining erect posture, draw shoulders back while bringing elbows back and inward. Return to starting position. Repeat __10-20__ times every _3-4___ hours.  Copyright  VHI. All rights reserved.

## 2015-02-16 ENCOUNTER — Other Ambulatory Visit: Payer: Self-pay | Admitting: Family

## 2015-02-16 ENCOUNTER — Ambulatory Visit: Payer: Medicare Other | Admitting: Physical Therapy

## 2015-02-16 DIAGNOSIS — M542 Cervicalgia: Secondary | ICD-10-CM

## 2015-02-16 DIAGNOSIS — M25512 Pain in left shoulder: Secondary | ICD-10-CM

## 2015-02-16 NOTE — Therapy (Signed)
[facility] [place], [place], [address] Phone: [phone]   Fax:  [phone]  Physical Therapy Treatment  Patient Details  Name: Lorraine Leonard MRN: [id number] Date of Birth: December 04, 1940 No Data Recorded  Encounter Date: 02/16/2015      PT End of Session - 02/16/15 1252    Visit Number 5   Number of Visits 18   Date for PT Re-Evaluation 03/22/15   PT Start Time 1115   PT Stop Time 1209   PT Time Calculation (min) 54 min   Activity Tolerance Patient tolerated treatment well   Behavior During Therapy [facility] for tasks assessed/performed      Past Medical History  Diagnosis Date  . Hypertension   . Diabetes mellitus     pre-diabetic  . Allergic rhinitis   . Urticaria   . GERD (gastroesophageal reflux disease)   . 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 Diverticulosis   . Macular degeneration   . Depression   . Hypothyroidism   . Anxiety   . Anxiety   . Rheumatoid arthritis(714.0)   . Hyperlipidemia     Past Surgical History  Procedure Laterality Date  . Lumbar disc surgery      x 2  . 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 Vaginal hysterectomy    . Tubal ligation      There were no vitals filed for this visit.  Visit Diagnosis:  Left shoulder pain  Neck pain      Subjective Assessment - 02/16/15 1239    Subjective CC is left shoulder pain with referred pain down left arm with certain movements.  Patient states she is doing the yellow back exercise at home.   Limitations Standing;Sitting   Pain Score 6    Pain Location Shoulder   Pain Orientation Left   Pain Type Chronic pain   Pain Onset More than a month ago                                 PT Education - 02/15/15 1402    Education provided Yes   Education Details HEP   Person(s) Educated Patient   Methods Explanation;Demonstration;Handout   Comprehension Verbalized understanding;Returned demonstration           PT Short Term Goals - 02/15/15 1431    PT SHORT TERM GOAL #1   Title Ind with HEP.   Time 2   Period Weeks   Status Achieved  02/15/15)           PT Long Term Goals - 02/01/15 1347    PT LONG TERM GOAL #1   Title Bilateral active cervical rotation= 60-65 degrees so she can turn her head more easily while driving.   Time 6   Period Weeks   Status New   PT LONG TERM GOAL #2   Title Perform ADL's wiht pain not > 3/10.   Time 6   Period Weeks   Status New   PT LONG TERM GOAL #3   Title Perform left shoulder functional antigravity movements with pain not > 3/10.               Plan - 02/15/15 1432    Clinical Impression Statement patient progressing with good response to treatment thus far and has little to no pain at rest and withh increase up to 7/10 with cervical rotation.  HEP given to patient for posture and ER strength per MPT. Patient met STG #1, other goals ongoing due to ROM and pain deficits.    Pt will benefit from skilled therapeutic intervention in order to improve on the following deficits Pain;Decreased activity tolerance   Rehab Potential Good   PT Frequency 3x / week   PT Duration 6 weeks   PT Treatment/Interventions ADLs/Self Care Home Management;Cryotherapy;Electrical Stimulation;Moist Heat;Therapeutic exercise;Therapeutic activities;Ultrasound;Patient/family education;Manual techniques;Passive range of motion   PT Next Visit Plan cont with POC per MPT for Left UT STW/M; Combo E'stim/U/S; Mckenzie neck exercises; left shoulder RW4.   Consulted and Agree with Plan of Care Patient        Problem List Patient Active Problem List   Diagnosis Date Noted  . Macular degeneration of both eyes 11/28/2014  . Constipation 11/28/2014  . Chronic rhinitis 11/17/2014  . Hypertensive retinopathy 10/30/2014  . Vitamin D deficiency 08/17/2014  . Arthritis of knee 08/17/2014  . Hyperlipemia 08/17/2014  . Depression   . Hypothyroidism   . Anxiety   .  Rheumatoid arthritis ([place])   . Hyperlipidemia   . Cardiomegaly 05/05/2011  . Essential hypertension 02/08/2007  . 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 GASTROESOPHAGEAL REFLUX DISEASE 02/08/2007   Treatment:  Seated IFC at 100% scan x 20 minutes to patient's left shoulder f/b Combo E' Stim--U/S at 1.50 W/CM2 x 12 minutes f/b STW/M including IASTM and left UT release technique x 14 minutes.  Patient felt good after treatment.  Koraima Albertsen, [place] MPT 02/16/2015, 12:54 PM  [facility] [address] [place], [place], [address] Phone: [phone]   Fax:  [phone]  Name: Lorraine Leonard MRN: [id number] Date of Birth: 10-03-1940

## 2015-02-20 ENCOUNTER — Other Ambulatory Visit: Payer: Self-pay | Admitting: Family Medicine

## 2015-02-20 ENCOUNTER — Encounter: Payer: Medicare Other | Admitting: Physical Therapy

## 2015-02-20 NOTE — Telephone Encounter (Signed)
Last seen 10/5/ 16  Dr Dettinger   If approved print

## 2015-02-21 ENCOUNTER — Encounter: Payer: Medicare Other | Admitting: Physical Therapy

## 2015-02-21 NOTE — Telephone Encounter (Signed)
We'll print and okayed a refill. Caryl Pina, MD [facility] 02/21/2015, 8:29 AM

## 2015-02-21 NOTE — Telephone Encounter (Signed)
Patient informed that prescription is ready for pick up and prescription placed at front desk

## 2015-02-22 ENCOUNTER — Encounter: Payer: Medicare Other | Admitting: Physical Therapy

## 2015-03-01 ENCOUNTER — Encounter: Payer: Self-pay | Admitting: Family

## 2015-03-01 ENCOUNTER — Encounter (INDEPENDENT_AMBULATORY_CARE_PROVIDER_SITE_OTHER): Payer: Self-pay

## 2015-03-01 ENCOUNTER — Ambulatory Visit (INDEPENDENT_AMBULATORY_CARE_PROVIDER_SITE_OTHER): Payer: Medicare Other | Admitting: Family

## 2015-03-01 VITALS — BP 163/79 | HR 69 | Temp 97.1°F | Ht 67.0 in | Wt 233.8 lb

## 2015-03-01 DIAGNOSIS — H3530 Unspecified macular degeneration: Secondary | ICD-10-CM | POA: Diagnosis not present

## 2015-03-01 DIAGNOSIS — E785 Hyperlipidemia, unspecified: Secondary | ICD-10-CM | POA: Diagnosis not present

## 2015-03-01 DIAGNOSIS — M0579 Rheumatoid arthritis with rheumatoid factor of multiple sites without organ or systems involvement: Secondary | ICD-10-CM

## 2015-03-01 DIAGNOSIS — R739 Hyperglycemia, unspecified: Secondary | ICD-10-CM | POA: Diagnosis not present

## 2015-03-01 DIAGNOSIS — K219 Gastro-esophageal reflux disease without esophagitis: Secondary | ICD-10-CM | POA: Diagnosis not present

## 2015-03-01 DIAGNOSIS — E039 Hypothyroidism, unspecified: Secondary | ICD-10-CM | POA: Diagnosis not present

## 2015-03-01 DIAGNOSIS — F329 Major depressive disorder, single episode, unspecified: Secondary | ICD-10-CM

## 2015-03-01 DIAGNOSIS — E559 Vitamin D deficiency, unspecified: Secondary | ICD-10-CM

## 2015-03-01 DIAGNOSIS — E8881 Metabolic syndrome: Secondary | ICD-10-CM

## 2015-03-01 DIAGNOSIS — K5901 Slow transit constipation: Secondary | ICD-10-CM

## 2015-03-01 DIAGNOSIS — F32A Depression, unspecified: Secondary | ICD-10-CM

## 2015-03-01 DIAGNOSIS — J310 Chronic rhinitis: Secondary | ICD-10-CM | POA: Diagnosis not present

## 2015-03-01 DIAGNOSIS — F419 Anxiety disorder, unspecified: Secondary | ICD-10-CM | POA: Diagnosis not present

## 2015-03-01 DIAGNOSIS — Z23 Encounter for immunization: Secondary | ICD-10-CM | POA: Diagnosis not present

## 2015-03-01 DIAGNOSIS — I10 Essential (primary) hypertension: Secondary | ICD-10-CM | POA: Diagnosis not present

## 2015-03-01 DIAGNOSIS — Z905 Acquired absence of kidney: Secondary | ICD-10-CM | POA: Insufficient documentation

## 2015-03-01 LAB — POCT GLYCOSYLATED HEMOGLOBIN (HGB A1C): Hemoglobin A1C: 6.1

## 2015-03-01 MED ORDER — METOPROLOL SUCCINATE ER 50 MG PO TB24: 50.0000 mg | ORAL_TABLET | Freq: Every day | ORAL | Status: DC

## 2015-03-01 NOTE — Addendum Note (Signed)
Addended by: Shelbie Ammons on: 03/01/2015 03:13 PM   Modules accepted: Orders

## 2015-03-01 NOTE — Addendum Note (Signed)
Addended by: Earlene Plater on: 03/01/2015 03:08 PM   Modules accepted: Miquel Dunn

## 2015-03-01 NOTE — Progress Notes (Signed)
Subjective:    Patient ID: Lorraine Leonard, female    DOB: 1941/01/02, 74 y.o.   MRN: [id number]  Pt presents to the office today for chronic follow up.  Hypertension This is a chronic problem. The current episode started more than 1 year ago. The problem has been waxing and waning since onset. The problem is uncontrolled. Associated symptoms include anxiety. Pertinent negatives include no headaches, palpitations, peripheral edema or shortness of breath. Risk factors for coronary artery disease include dyslipidemia, family history, obesity, post-menopausal state and sedentary lifestyle. Past treatments include angiotensin blockers and diuretics. The current treatment provides moderate improvement. Hypertensive end-organ damage includes a thyroid problem. There is no history of kidney disease, CAD/MI, CVA or heart failure.  Hyperlipidemia This is a chronic problem. The current episode started more than 1 year ago. The problem is controlled. Recent lipid tests were reviewed and are normal. She has no history of diabetes. Pertinent negatives include no shortness of breath. Current antihyperlipidemic treatment includes statins. The current treatment provides significant improvement of lipids. Risk factors for coronary artery disease include dyslipidemia, hypertension, obesity and post-menopausal.  Constipation This is a chronic problem. The current episode started more than 1 year ago. The problem has been resolved since onset. Her stool frequency is 4 to 5 times per week. Associated symptoms include nausea. Pertinent negatives include no diarrhea, fecal incontinence or vomiting. She has tried laxatives for the symptoms. The treatment provided significant relief.  Arthritis Presents for follow-up visit. The disease course has been stable. She complains of pain, stiffness and joint swelling. Affected locations include the left MCP, left wrist, right wrist and right MCP. Her pain is at a severity of 5/10.  Associated symptoms include fatigue. Pertinent negatives include no diarrhea. Her past medical history is significant for osteoarthritis and rheumatoid arthritis. Her pertinent risk factors include overuse. Past treatments include rest and corticosteroids. The treatment provided significant relief. Compliance with prior treatments has been good.  Thyroid Problem Presents for follow-up visit. Symptoms include anxiety, constipation, depressed mood and fatigue. Patient reports no diarrhea, hoarse voice, leg swelling or palpitations. The symptoms have been stable. Past treatments include levothyroxine. The treatment provided significant relief. Her past medical history is significant for hyperlipidemia. There is no history of diabetes or heart failure.  Anxiety Presents for follow-up visit. Onset was 1 to 6 months ago. The problem has been waxing and waning. Symptoms include depressed mood, excessive worry, nausea and nervous/anxious behavior. Patient reports no insomnia, palpitations, shortness of breath or suicidal ideas. Symptoms occur most days. The symptoms are aggravated by family issues. The quality of sleep is good.   Her past medical history is significant for anxiety/panic attacks and depression.  Depression      The patient presents with depression.  This is a chronic problem.  The current episode started more than 1 year ago.   The onset quality is gradual.   The problem occurs intermittently.  The problem has been waxing and waning since onset.  Associated symptoms include fatigue.  Associated symptoms include does not have insomnia, not irritable, no headaches and no suicidal ideas.  Past medical history includes thyroid problem, anxiety and depression.   Gastroesophageal Reflux She complains of nausea. She reports no belching, no coughing, no heartburn or no hoarse voice. This is a chronic problem. The current episode started more than 1 year ago. The problem occurs rarely. The problem has been  resolved. The symptoms are aggravated by lying down. Associated symptoms include fatigue.  Risk factors include obesity. She has tried a PPI for the symptoms. The treatment provided significant relief.      Review of Systems  Constitutional: Positive for fatigue.  HENT: Negative.  Negative for hoarse voice.   Eyes: Negative.   Respiratory: Negative.  Negative for cough and shortness of breath.   Cardiovascular: Negative.  Negative for palpitations.  Gastrointestinal: Positive for nausea and constipation. Negative for heartburn, vomiting and diarrhea.  Endocrine: Negative.   Genitourinary: Negative.   Musculoskeletal: Positive for joint swelling, arthritis and stiffness.  Neurological: Negative.  Negative for headaches.  Hematological: Negative.   Psychiatric/Behavioral: Positive for depression. Negative for suicidal ideas. The patient is nervous/anxious. The patient does not have insomnia.   All other systems reviewed and are negative.      Objective:   Physical Exam  Constitutional: She is oriented to person, place, and time. She appears well-developed and well-nourished. She is not irritable. No distress.  HENT:  Head: Normocephalic and atraumatic.  Right Ear: External ear normal.  Left Ear: External ear normal.  Nose: Nose normal.  Mouth/Throat: Oropharynx is clear and moist.  Eyes: Pupils are equal, round, and reactive to light.  Neck: Normal range of motion. Neck supple. No thyromegaly present.  Cardiovascular: Normal rate, regular rhythm, normal heart sounds and intact distal pulses.   No murmur heard. Pulmonary/Chest: Effort normal and breath sounds normal. No respiratory distress. She has no wheezes.  Abdominal: Soft. Bowel sounds are normal. She exhibits no distension. There is no tenderness.  Musculoskeletal: Normal range of motion. She exhibits no edema or tenderness.  Neurological: She is alert and oriented to person, place, and time. She has normal reflexes. No  cranial nerve deficit.  Skin: Skin is warm and dry.  Psychiatric: She has a normal mood and affect. Her behavior is normal. Judgment and thought content normal.  Vitals reviewed.   BP 163/79 mmHg  Pulse 69  Temp(Src) 97.1 F (36.2 C) (Oral)  Ht $R'5\' 7"'OZ$  (1.702 m)  Wt 233 lb 12.8 oz (106.051 kg)  BMI 36.61 kg/m2       Assessment & Plan:  1. Essential hypertension -Pt started on metoprolol 50 mg daily today -Dash diet information given -Exercise encouraged - Stress Management  -Continue current meds -RTO in 2 weeks  - CMP14+EGFR - metoprolol succinate (TOPROL-XL) 50 MG 24 hr tablet; Take 1 tablet (50 mg total) by mouth daily. Take with or immediately following a meal.  Dispense: 90 tablet; Refill: 3  2. Chronic rhinitis - CMP14+EGFR  3. Slow transit constipation - CMP14+EGFR  4. Gastroesophageal reflux disease, esophagitis presence not specified - CMP14+EGFR  5. Hypothyroidism, unspecified hypothyroidism type - CMP14+EGFR - Thyroid Panel With TSH  6. Rheumatoid arthritis involving multiple sites with positive rheumatoid factor (HCC) - CMP14+EGFR  7. Anxiety - CMP14+EGFR  8. Depression - CMP14+EGFR  9. Hyperlipemia - CMP14+EGFR - Lipid panel  10. Vitamin D deficiency - CMP14+EGFR - Vit D  25 hydroxy (rtn osteoporosis monitoring)  11. Macular degeneration of both eyes - CMP14+EGFR  12. Metabolic syndrome - POCT glycosylated hemoglobin (Hb A1C) - CMP14+EGFR  13. Single kidney - CMP14+EGFR  14. Blood glucose elevated - POCT glycosylated hemoglobin (Hb A1C) - CMP14+EGFR   Continue all meds Labs pending Health Maintenance reviewed- Prevnar given today Diet and exercise encouraged RTO 2 week for HTN  Evelina Dun, FNP

## 2015-03-01 NOTE — Patient Instructions (Signed)

## 2015-03-02 ENCOUNTER — Telehealth: Payer: Self-pay | Admitting: Family

## 2015-03-02 ENCOUNTER — Other Ambulatory Visit: Payer: Self-pay | Admitting: Family

## 2015-03-02 LAB — CMP14+EGFR
ALT: 20 IU/L (ref 0–32)
AST: 27 IU/L (ref 0–40)
Albumin/Globulin Ratio: 1.6 (ref 1.1–2.5)
Albumin: 4.5 g/dL (ref 3.5–4.8)
Alkaline Phosphatase: 95 IU/L (ref 39–117)
BUN/Creatinine Ratio: 14 (ref 11–26)
BUN: 12 mg/dL (ref 8–27)
Bilirubin Total: 0.3 mg/dL (ref 0.0–1.2)
CO2: 29 mmol/L (ref 18–29)
Calcium: 10.5 mg/dL — ABNORMAL HIGH (ref 8.7–10.3)
Chloride: 100 mmol/L (ref 97–106)
Creatinine, Ser: 0.85 mg/dL (ref 0.57–1.00)
GFR calc Af Amer: 78 mL/min/{1.73_m2} (ref >59)
GFR calc non Af Amer: 68 mL/min/{1.73_m2} (ref >59)
Globulin, Total: 2.9 g/dL (ref 1.5–4.5)
Glucose: 87 mg/dL (ref 65–99)
Potassium: 5 mmol/L (ref 3.5–5.2)
Sodium: 143 mmol/L (ref 136–144)
Total Protein: 7.4 g/dL (ref 6.0–8.5)

## 2015-03-02 LAB — LIPID PANEL
Chol/HDL Ratio: 4.1 ratio units (ref 0.0–4.4)
Cholesterol, Total: 141 mg/dL (ref 100–199)
HDL: 34 mg/dL — ABNORMAL LOW (ref >39)
LDL Calculated: 63 mg/dL (ref 0–99)
Triglycerides: 221 mg/dL — ABNORMAL HIGH (ref 0–149)
VLDL Cholesterol Cal: 44 mg/dL — ABNORMAL HIGH (ref 5–40)

## 2015-03-02 LAB — VITAMIN D 25 HYDROXY (VIT D DEFICIENCY, FRACTURES): Vit D, 25-Hydroxy: 25.7 ng/mL — ABNORMAL LOW (ref 30.0–100.0)

## 2015-03-02 LAB — THYROID PANEL WITH TSH
Free Thyroxine Index: 1.5 (ref 1.2–4.9)
T3 Uptake Ratio: 25 % (ref 24–39)
T4, Total: 6.1 ug/dL (ref 4.5–12.0)
TSH: 3.05 u[IU]/mL (ref 0.450–4.500)

## 2015-03-05 NOTE — Telephone Encounter (Signed)
Pt aware in results note encounter.

## 2015-03-08 ENCOUNTER — Encounter (INDEPENDENT_AMBULATORY_CARE_PROVIDER_SITE_OTHER): Payer: Medicare Other | Admitting: Ophthalmology

## 2015-03-08 DIAGNOSIS — H35033 Hypertensive retinopathy, bilateral: Secondary | ICD-10-CM

## 2015-03-08 DIAGNOSIS — E11311 Type 2 diabetes mellitus with unspecified diabetic retinopathy with macular edema: Secondary | ICD-10-CM

## 2015-03-08 DIAGNOSIS — E113213 Type 2 diabetes mellitus with mild nonproliferative diabetic retinopathy with macular edema, bilateral: Secondary | ICD-10-CM | POA: Diagnosis not present

## 2015-03-08 DIAGNOSIS — I10 Essential (primary) hypertension: Secondary | ICD-10-CM

## 2015-03-08 DIAGNOSIS — H353231 Exudative age-related macular degeneration, bilateral, with active choroidal neovascularization: Secondary | ICD-10-CM

## 2015-03-08 DIAGNOSIS — H43813 Vitreous degeneration, bilateral: Secondary | ICD-10-CM | POA: Diagnosis not present

## 2015-03-13 ENCOUNTER — Other Ambulatory Visit: Payer: Self-pay | Admitting: Family

## 2015-03-14 ENCOUNTER — Other Ambulatory Visit: Payer: Self-pay | Admitting: Family Medicine

## 2015-03-19 ENCOUNTER — Ambulatory Visit (INDEPENDENT_AMBULATORY_CARE_PROVIDER_SITE_OTHER): Payer: Medicare Other | Admitting: Family

## 2015-03-19 ENCOUNTER — Encounter: Payer: Self-pay | Admitting: Family

## 2015-03-19 VITALS — BP 133/61 | HR 54 | Temp 97.5°F | Ht 67.0 in | Wt 233.0 lb

## 2015-03-19 DIAGNOSIS — I10 Essential (primary) hypertension: Secondary | ICD-10-CM | POA: Diagnosis not present

## 2015-03-19 NOTE — Progress Notes (Signed)
   Subjective:    Patient ID: Lorraine Leonard, female    DOB: 05-Jun-1940, 74 y.o.   MRN: [id number]  Pt presents to the office today to recheck HTn. Pt's BP is at goal! Hypertension This is a chronic problem. The current episode started more than 1 year ago. The problem has been waxing and waning since onset. The problem is uncontrolled. Pertinent negatives include no blurred vision, headaches, palpitations or peripheral edema. Risk factors for coronary artery disease include dyslipidemia, family history, obesity, post-menopausal state and sedentary lifestyle. Past treatments include angiotensin blockers and diuretics. The current treatment provides significant improvement. Hypertensive end-organ damage includes a thyroid problem. There is no history of kidney disease, CAD/MI or CVA.      Review of Systems  Constitutional: Negative.   HENT: Negative.   Eyes: Negative.  Negative for blurred vision.  Respiratory: Negative.   Cardiovascular: Negative.  Negative for palpitations.  Endocrine: Negative.   Genitourinary: Negative.   Neurological: Negative.  Negative for headaches.  Hematological: Negative.   All other systems reviewed and are negative.      Objective:   Physical Exam  Constitutional: She is oriented to person, place, and time. She appears well-developed and well-nourished. No distress.  HENT:  Head: Normocephalic and atraumatic.  Right Ear: External ear normal.  Left Ear: External ear normal.  Eyes: Pupils are equal, round, and reactive to light.  Neck: Normal range of motion. Neck supple. No thyromegaly present.  Cardiovascular: Normal rate, regular rhythm, normal heart sounds and intact distal pulses.   No murmur heard. Pulmonary/Chest: Effort normal and breath sounds normal. No respiratory distress. She has no wheezes.  Abdominal: Soft. Bowel sounds are normal. She exhibits no distension. There is no tenderness.  Musculoskeletal: Normal range of motion. She exhibits no  edema or tenderness.  Neurological: She is alert and oriented to person, place, and time. She has normal reflexes. No cranial nerve deficit.  Skin: Skin is warm and dry.  Psychiatric: She has a normal mood and affect. Her behavior is normal. Judgment and thought content normal.  Vitals reviewed.     BP 133/61 mmHg  Pulse 54  Temp(Src) 97.5 F (36.4 C) (Oral)  Ht _0  (1.702 m)  Wt 233 lb (105.688 kg)  BMI 36.48 kg/m2     Assessment & Plan:  1. Essential hypertension -Daily blood pressure log given with instructions on how to fill out and told to bring to next visit -Dash diet information given -Exercise encouraged - Stress Management  -Continue current meds -RTO in 4 months - BMP8+EGFR   Continue all meds Labs pending Health Maintenance reviewed Diet and exercise encouraged RTO 4 months   Evelina Dun, FNP

## 2015-03-20 LAB — BMP8+EGFR
BUN/Creatinine Ratio: 16 (ref 11–26)
BUN: 15 mg/dL (ref 8–27)
CO2: 27 mmol/L (ref 18–29)
Calcium: 9.6 mg/dL (ref 8.7–10.3)
Chloride: 98 mmol/L (ref 97–106)
Creatinine, Ser: 0.94 mg/dL (ref 0.57–1.00)
GFR calc Af Amer: 69 mL/min/{1.73_m2} (ref >59)
GFR calc non Af Amer: 60 mL/min/{1.73_m2} (ref >59)
Glucose: 141 mg/dL — ABNORMAL HIGH (ref 65–99)
Potassium: 4.7 mmol/L (ref 3.5–5.2)
Sodium: 139 mmol/L (ref 136–144)

## 2015-03-24 ENCOUNTER — Telehealth: Payer: Self-pay | Admitting: Family

## 2015-03-24 ENCOUNTER — Ambulatory Visit (INDEPENDENT_AMBULATORY_CARE_PROVIDER_SITE_OTHER): Payer: Medicare Other | Admitting: Pediatrics

## 2015-03-24 VITALS — BP 143/63 | HR 51 | Temp 97.8°F | Ht 67.0 in | Wt 235.0 lb

## 2015-03-24 DIAGNOSIS — L508 Other urticaria: Secondary | ICD-10-CM | POA: Diagnosis not present

## 2015-03-24 NOTE — Telephone Encounter (Signed)
Patient was seen in the clinic.

## 2015-03-24 NOTE — Patient Instructions (Signed)
Take allergy medicines as recommended by allergist  Benadryl as needed for breakthrough itching

## 2015-03-24 NOTE — Progress Notes (Signed)
Subjective:    Patient ID: Lorraine Leonard, female    DOB: Feb 07, 1941, 74 y.o.   MRN: [id number]  CC: Urticaria   HPI: Lorraine Leonard is a 74 y.o. female with chronic urticaria and RA presenting for Urticaria  Has had urticaria off and on for years, since 1995 with a back operation At last visit strated trying to avoid fragrant soap Washed clothes in regular soap about a week ago, has had flare of urticaria since then, migrating from hands to legs daily Using topical mometasone and voltaren gel with minimal improvement Ice helps the most Itches a lot Seen by Dr. Orvil Feil, allergist, though ahs been some time since she last saw her Pt was recommended to take allergra and levocetirizine but she doesn't regularly take them Has not tried them with this outbreak, has not tried benadryl or atarax She doesn't like the allergy medicines because it makes her sleepy She was talking with [facility] about possible xolair treatment though she was nervous about it she has not seen them in 9 months.  Relevant past medical, surgical, family and social history reviewed and updated as indicated. Interim medical history since our last visit reviewed. Allergies and medications reviewed and updated.  ROS: Per HPI unless specifically indicated above  History  Smoking status  . Never Smoker   Smokeless tobacco  . Never Used    Past Medical History Patient Active Problem List   Diagnosis Date Noted  . Chronic urticaria 03/24/2015  . Metabolic syndrome [id number]  . Single kidney 03/01/2015  . Macular degeneration of both eyes 11/28/2014  . Constipation 11/28/2014  . Chronic rhinitis 11/17/2014  . Hypertensive retinopathy 10/30/2014  . Vitamin D deficiency 08/17/2014  . Arthritis of knee 08/17/2014  . Hyperlipemia 08/17/2014  . Depression   . Hypothyroidism   . Anxiety   . Rheumatoid arthritis ([place])   . Cardiomegaly 05/05/2011  . Essential hypertension 02/08/2007  . 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 GASTROESOPHAGEAL REFLUX DISEASE 02/08/2007    Current Outpatient Prescriptions  Medication Sig Dispense Refill  . aspirin 81 MG tablet Take 81 mg by mouth daily.     Marland Kitchen azelastine (ASTELIN) 0.1 % nasal spray PLACE 2 SPRAYS INTO BOTH NOSTRILS 2 (TWO) TIMES DAILY AS NEEDED. 30 mL 5  . calcium citrate (CALCITRATE - DOSED IN MG ELEMENTAL CALCIUM) 950 MG tablet Take 1 tablet by mouth daily.    . Cholecalciferol (VITAMIN D) 2000 UNITS CAPS Take 1 capsule by mouth daily.    Marland Kitchen conjugated estrogens (PREMARIN) vaginal cream Place 0.5 g vaginally daily. Use as directed    . cyanocobalamin 500 MCG tablet Take 2,000 mcg by mouth daily.     . diazepam (VALIUM) 5 MG tablet TAKE 1 TABLET 2 TIMES A DAY AS NEEDED FOR ANXIETY 60 tablet 5  . levothyroxine (SYNTHROID, LEVOTHROID) 88 MCG tablet Take 1 tablet (88 mcg total) by mouth daily before breakfast. 30 tablet 6  . metoprolol succinate (TOPROL-XL) 50 MG 24 hr tablet Take 1 tablet (50 mg total) by mouth daily. Take with or immediately following a meal. 90 tablet 3  . mometasone (ELOCON) 0.1 % cream APPLY TO AFFECTED AREA DAILY 45 g 0  . moxifloxacin (VIGAMOX) 0.5 % ophthalmic solution Place 1 drop into the right eye See admin instructions. Pt takes three times daily for 2 days after eye injection    . NEXIUM 40 MG capsule TAKE 1 CAPSULE (40 MG TOTAL) BY MOUTH  DAILY BEFORE BREAKFAST. 30 capsule 5  . PATADAY 0.2 % SOLN Place 1 drop into both eyes daily.    . polyethylene glycol powder (GLYCOLAX/MIRALAX) powder Take 17 g by mouth 2 (two) times daily as needed. 3350 g 1  . PROAIR HFA 108 (90 BASE) MCG/ACT inhaler INHALE 2 PUFFS INTO THE LUNGS EVERY 6 (SIX) HOURS AS NEEDED FOR WHEEZING. 8.5 each 4  . simvastatin (ZOCOR) 40 MG tablet TAKE 1 TABLET (40 MG TOTAL) BY MOUTH AT BEDTIME. 30 tablet 5  . traMADol (ULTRAM) 50 MG tablet TAKE 1 TABLET EVERY 6 HOURS AS NEEDED 90 tablet 0  . trimethoprim (TRIMPEX) 100 MG tablet Take  100 mg by mouth daily.    . valsartan-hydrochlorothiazide (DIOVAN HCT) 160-25 MG per tablet Take 1 tablet by mouth daily. 30 tablet 5  . vitamin C (ASCORBIC ACID) 500 MG tablet Take 500 mg by mouth daily.    . vitamin E 400 UNIT capsule Take 400 Units by mouth daily.    . VOLTAREN 1 % GEL APPLY 2 GRAMS TOPICALLY 4 (FOUR) TIMES DAILY. 100 g 3   No current facility-administered medications for this visit.       Objective:    BP 164/66 mmHg  Pulse 49  Temp(Src) 97.8 F (36.6 C) (Oral)  Ht 5\' 7"  (1.702 m)  Wt 235 lb (106.595 kg)  BMI 36.80 kg/m2  Wt Readings from Last 3 Encounters:  03/24/15 235 lb (106.595 kg)  03/19/15 233 lb (105.688 kg)  03/01/15 233 lb 12.8 oz (106.051 kg)    Gen: NAD, alert, cooperative with exam, NCAT EYES: EOMI, no scleral injection or icterus ENT:  TMs pearly gray b/l, OP without erythema LYMPH: no cervical LAD CV: NRRR, normal S1/S2, no murmur Resp: CTABL, no wheezes, normal WOB Abd: +BS, soft, NTND. no guarding or organomegaly Ext: No edema, warm Neuro: Alert and oriented Skin: raised red plaque with serpentiginous borders over back of legs b/l     Assessment & Plan:    Lorraine Leonard was seen today for urticaria. Has not been back to f/u with allergy. Take allergy medicines as they prescribed, benadryl as needed for itching. Drink plnety of fluids. Elevated SBP today, due for 6 mo HTN f/u.  Diagnoses and all orders for this visit:  Chronic urticaria  Elevated BP  Follow up plan: Return in about 2 weeks (around 04/07/2015) for BP check and med follow up w/ PCP .  Assunta Found, MD [facility] 03/24/2015, 12:26 PM

## 2015-04-06 ENCOUNTER — Encounter: Payer: Self-pay | Admitting: Pediatrics

## 2015-04-06 ENCOUNTER — Ambulatory Visit (INDEPENDENT_AMBULATORY_CARE_PROVIDER_SITE_OTHER): Payer: Medicare Other | Admitting: Pediatrics

## 2015-04-06 VITALS — BP 138/61 | HR 54 | Temp 97.1°F | Ht 67.0 in | Wt 235.0 lb

## 2015-04-06 DIAGNOSIS — N390 Urinary tract infection, site not specified: Secondary | ICD-10-CM

## 2015-04-06 DIAGNOSIS — I10 Essential (primary) hypertension: Secondary | ICD-10-CM | POA: Diagnosis not present

## 2015-04-06 DIAGNOSIS — R32 Unspecified urinary incontinence: Secondary | ICD-10-CM

## 2015-04-06 DIAGNOSIS — R8281 Pyuria: Secondary | ICD-10-CM

## 2015-04-06 DIAGNOSIS — L508 Other urticaria: Secondary | ICD-10-CM

## 2015-04-06 LAB — POCT UA - MICROSCOPIC ONLY
Bacteria, U Microscopic: NEGATIVE
Casts, Ur, LPF, POC: NEGATIVE
Crystals, Ur, HPF, POC: NEGATIVE
Mucus, UA: NEGATIVE
Yeast, UA: NEGATIVE

## 2015-04-06 LAB — POCT URINALYSIS DIPSTICK
Bilirubin, UA: NEGATIVE
Glucose, UA: NEGATIVE
Ketones, UA: NEGATIVE
Nitrite, UA: NEGATIVE
Protein, UA: NEGATIVE
Spec Grav, UA: <=1.005
Urobilinogen, UA: NEGATIVE
pH, UA: 7

## 2015-04-06 MED ORDER — NITROFURANTOIN MONOHYD MACRO 100 MG PO CAPS: 100.0000 mg | ORAL_CAPSULE | Freq: Two times a day (BID) | ORAL | Status: DC

## 2015-04-06 NOTE — Progress Notes (Signed)
Subjective:    Patient ID: Lorraine Leonard, female    DOB: 08-Apr-1941, 74 y.o.   MRN: [id number]  CC: Urinary Incontinence   HPI: Lorraine Leonard is a 74 y.o. female presenting for Urinary Incontinence   Still with hives, have moved and changed since last visit. Hasnt gotten back in to see allergist yet Some burning with urination Symptoms started yesterday Incontinence starting today, not associated with stress or urgency, just comes out at times. Has been wearing pullups all day which is unusual for her Is on trimethoprim chronically for UTI ppx No fevers, normal appetite    Depression screen [facility] 2/9 04/06/2015 03/01/2015 01/12/2015 11/28/2014 09/05/2014  Decreased Interest 0 1 0 0 0  Down, Depressed, Hopeless 0 0 1 0 0  PHQ - 2 Score 0 1 1 0 0     Relevant past medical, surgical, family and social history reviewed and updated as indicated. Interim medical history since our last visit reviewed. Allergies and medications reviewed and updated.    ROS: Per HPI unless specifically indicated above  History  Smoking status  . Never Smoker   Smokeless tobacco  . Never Used    Past Medical History Patient Active Problem List   Diagnosis Date Noted  . Chronic urticaria 03/24/2015  . Metabolic syndrome XX123456  . Single kidney 03/01/2015  . Macular degeneration of both eyes 11/28/2014  . Constipation 11/28/2014  . Chronic rhinitis 11/17/2014  . Hypertensive retinopathy 10/30/2014  . Vitamin D deficiency 08/17/2014  . Arthritis of knee 08/17/2014  . Hyperlipemia 08/17/2014  . Depression   . Hypothyroidism   . Anxiety   . Rheumatoid arthritis ([place])   . Cardiomegaly 05/05/2011  . Essential hypertension 02/08/2007  . 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 GASTROESOPHAGEAL REFLUX DISEASE 02/08/2007    Current Outpatient Prescriptions  Medication Sig Dispense Refill  . aspirin 81 MG tablet Take 81 mg by mouth daily.     Marland Kitchen azelastine  (ASTELIN) 0.1 % nasal spray PLACE 2 SPRAYS INTO BOTH NOSTRILS 2 (TWO) TIMES DAILY AS NEEDED. 30 mL 5  . calcium citrate (CALCITRATE - DOSED IN MG ELEMENTAL CALCIUM) 950 MG tablet Take 1 tablet by mouth daily.    . Cholecalciferol (VITAMIN D) 2000 UNITS CAPS Take 1 capsule by mouth daily.    Marland Kitchen conjugated estrogens (PREMARIN) vaginal cream Place 0.5 g vaginally daily. Use as directed    . cyanocobalamin 500 MCG tablet Take 2,000 mcg by mouth daily.     . diazepam (VALIUM) 5 MG tablet TAKE 1 TABLET 2 TIMES A DAY AS NEEDED FOR ANXIETY 60 tablet 5  . levothyroxine (SYNTHROID, LEVOTHROID) 88 MCG tablet Take 1 tablet (88 mcg total) by mouth daily before breakfast. 30 tablet 6  . metoprolol succinate (TOPROL-XL) 50 MG 24 hr tablet Take 1 tablet (50 mg total) by mouth daily. Take with or immediately following a meal. 90 tablet 3  . mometasone (ELOCON) 0.1 % cream APPLY TO AFFECTED AREA DAILY 45 g 0  . moxifloxacin (VIGAMOX) 0.5 % ophthalmic solution Place 1 drop into the right eye See admin instructions. Pt takes three times daily for 2 days after eye injection    . NEXIUM 40 MG capsule TAKE 1 CAPSULE (40 MG TOTAL) BY MOUTH DAILY BEFORE BREAKFAST. 30 capsule 5  . PATADAY 0.2 % SOLN Place 1 drop into both eyes daily.    . polyethylene glycol powder (GLYCOLAX/MIRALAX) powder Take 17 g by mouth 2 (two)  times daily as needed. 3350 g 1  . PROAIR HFA 108 (90 BASE) MCG/ACT inhaler INHALE 2 PUFFS INTO THE LUNGS EVERY 6 (SIX) HOURS AS NEEDED FOR WHEEZING. 8.5 each 4  . simvastatin (ZOCOR) 40 MG tablet TAKE 1 TABLET (40 MG TOTAL) BY MOUTH AT BEDTIME. 30 tablet 5  . traMADol (ULTRAM) 50 MG tablet TAKE 1 TABLET EVERY 6 HOURS AS NEEDED 90 tablet 0  . trimethoprim (TRIMPEX) 100 MG tablet Take 100 mg by mouth daily.    . valsartan-hydrochlorothiazide (DIOVAN HCT) 160-25 MG per tablet Take 1 tablet by mouth daily. 30 tablet 5  . vitamin C (ASCORBIC ACID) 500 MG tablet Take 500 mg by mouth daily.    . vitamin E 400 UNIT  capsule Take 400 Units by mouth daily.    . VOLTAREN 1 % GEL APPLY 2 GRAMS TOPICALLY 4 (FOUR) TIMES DAILY. 100 g 3  . nitrofurantoin, macrocrystal-monohydrate, (MACROBID) 100 MG capsule Take 1 capsule (100 mg total) by mouth 2 (two) times daily. 1 po BId 14 capsule 0   No current facility-administered medications for this visit.       Objective:    BP 138/61 mmHg  Pulse 54  Temp(Src) 97.1 F (36.2 C) (Oral)  Ht 5\' 7"  (1.702 m)  Wt 235 lb (106.595 kg)  BMI 36.80 kg/m2  Wt Readings from Last 3 Encounters:  04/06/15 235 lb (106.595 kg)  03/24/15 235 lb (106.595 kg)  03/19/15 233 lb (105.688 kg)     Gen: NAD, alert, cooperative with exam, NCAT EYES: EOMI, no scleral injection or icterus ENT:  MMM LYMPH: no cervical LAD CV: NRRR, normal S1/S2, no murmur, distal pulses 2+ b/l Resp: CTABL, no wheezes, normal WOB Abd: +BS, soft, NTND. no guarding or organomegaly, no CVA tenderness Ext: No edema, warm Neuro: Alert and oriented, strength equal b/l UE and LE, coordination grossly normal MSK: normal muscle bulk Skin: red raised plaques on anterior of legs b/l     Assessment & Plan:    Lorraine Leonard was seen today for urinary incontinence, found to have pyuria, likely a UTI.   Diagnoses and all orders for this visit:  Urinary incontinence, unspecified incontinence type -     POCT urinalysis dipstick -     POCT UA - Microscopic Only  Pyuria -     nitrofurantoin, macrocrystal-monohydrate, (MACROBID) 100 MG capsule; Take 1 capsule (100 mg total) by mouth 2 (two) times daily. 1 po BId -     Urine culture  Essential hypertension Much improved control today. Continue current meds.  Chronic urticaria Not controlled. encouraged her to call allergist and get back in to be seen. Has been thinking about starting xolair. Continue allergy meds.   Follow up plan: Return in about 4 weeks (around 05/04/2015).  Assunta Found, MD [facility] 04/06/2015, 3:22 PM

## 2015-04-08 LAB — URINE CULTURE

## 2015-04-10 ENCOUNTER — Other Ambulatory Visit: Payer: Self-pay | Admitting: Family Medicine

## 2015-04-10 ENCOUNTER — Telehealth: Payer: Self-pay | Admitting: Family Medicine

## 2015-04-10 DIAGNOSIS — J4520 Mild intermittent asthma, uncomplicated: Secondary | ICD-10-CM | POA: Diagnosis not present

## 2015-04-10 DIAGNOSIS — H1045 Other chronic allergic conjunctivitis: Secondary | ICD-10-CM | POA: Diagnosis not present

## 2015-04-10 DIAGNOSIS — J301 Allergic rhinitis due to pollen: Secondary | ICD-10-CM | POA: Diagnosis not present

## 2015-04-10 DIAGNOSIS — L501 Idiopathic urticaria: Secondary | ICD-10-CM | POA: Diagnosis not present

## 2015-04-10 NOTE — Telephone Encounter (Signed)
Forwarding to Dr. Warrick Parisian

## 2015-04-10 NOTE — Telephone Encounter (Signed)
Last seen 04/06/15  Dr Evette Doffing  If approved print

## 2015-04-11 NOTE — Telephone Encounter (Signed)
Patient aware and script placed up front

## 2015-04-11 NOTE — Telephone Encounter (Signed)
Go ahead and print refill, if needs another refill after this one we will have to discuss in the visit.

## 2015-04-13 NOTE — Telephone Encounter (Signed)
Labs faxed to [phone]

## 2015-04-24 ENCOUNTER — Other Ambulatory Visit: Payer: Self-pay | Admitting: Family Medicine

## 2015-04-26 DIAGNOSIS — L501 Idiopathic urticaria: Secondary | ICD-10-CM | POA: Diagnosis not present

## 2015-05-04 ENCOUNTER — Other Ambulatory Visit: Payer: Self-pay | Admitting: Family Medicine

## 2015-05-04 ENCOUNTER — Telehealth: Payer: Self-pay | Admitting: Family Medicine

## 2015-05-14 ENCOUNTER — Encounter (INDEPENDENT_AMBULATORY_CARE_PROVIDER_SITE_OTHER): Payer: Medicare Other | Admitting: Ophthalmology

## 2015-05-14 DIAGNOSIS — E113213 Type 2 diabetes mellitus with mild nonproliferative diabetic retinopathy with macular edema, bilateral: Secondary | ICD-10-CM | POA: Diagnosis not present

## 2015-05-14 DIAGNOSIS — H43813 Vitreous degeneration, bilateral: Secondary | ICD-10-CM | POA: Diagnosis not present

## 2015-05-14 DIAGNOSIS — E11311 Type 2 diabetes mellitus with unspecified diabetic retinopathy with macular edema: Secondary | ICD-10-CM | POA: Diagnosis not present

## 2015-05-14 DIAGNOSIS — I10 Essential (primary) hypertension: Secondary | ICD-10-CM

## 2015-05-14 DIAGNOSIS — H353231 Exudative age-related macular degeneration, bilateral, with active choroidal neovascularization: Secondary | ICD-10-CM

## 2015-05-14 DIAGNOSIS — H35033 Hypertensive retinopathy, bilateral: Secondary | ICD-10-CM | POA: Diagnosis not present

## 2015-05-21 ENCOUNTER — Telehealth: Payer: Self-pay | Admitting: *Deleted

## 2015-05-21 NOTE — Telephone Encounter (Signed)
Patient called in today complaining with chest pain yesterday and chest tightness today. Patient advised to go to the ER for evaluation.

## 2015-05-23 ENCOUNTER — Other Ambulatory Visit: Payer: Self-pay | Admitting: Family Medicine

## 2015-05-23 NOTE — Telephone Encounter (Signed)
Palpation this will be the last time a refill without her coming in seeing me in person for a routine visit.

## 2015-05-23 NOTE — Telephone Encounter (Signed)
Last seen for pain 01/24/2015, forwarding to Dr. Warrick Parisian

## 2015-05-23 NOTE — Telephone Encounter (Signed)
Last seen 03/27/15  Dr Evette Doffing  If approved print

## 2015-05-24 DIAGNOSIS — L501 Idiopathic urticaria: Secondary | ICD-10-CM | POA: Diagnosis not present

## 2015-05-28 DIAGNOSIS — H353232 Exudative age-related macular degeneration, bilateral, with inactive choroidal neovascularization: Secondary | ICD-10-CM | POA: Diagnosis not present

## 2015-05-28 DIAGNOSIS — Z961 Presence of intraocular lens: Secondary | ICD-10-CM | POA: Diagnosis not present

## 2015-05-28 DIAGNOSIS — H43813 Vitreous degeneration, bilateral: Secondary | ICD-10-CM | POA: Diagnosis not present

## 2015-05-28 DIAGNOSIS — H43393 Other vitreous opacities, bilateral: Secondary | ICD-10-CM | POA: Diagnosis not present

## 2015-06-05 ENCOUNTER — Other Ambulatory Visit: Payer: Self-pay | Admitting: Pediatrics

## 2015-06-05 NOTE — Telephone Encounter (Signed)
Needs to be seen so we can get urine culture to know what antibiotic she needs.

## 2015-06-08 ENCOUNTER — Ambulatory Visit: Payer: Medicare Other | Admitting: Family

## 2015-06-11 ENCOUNTER — Other Ambulatory Visit: Payer: Self-pay | Admitting: Pediatrics

## 2015-06-15 DIAGNOSIS — Z0000 Encounter for general adult medical examination without abnormal findings: Secondary | ICD-10-CM | POA: Diagnosis not present

## 2015-06-15 DIAGNOSIS — N3020 Other chronic cystitis without hematuria: Secondary | ICD-10-CM | POA: Diagnosis not present

## 2015-06-16 NOTE — Therapy (Signed)
[facility] [place], [place], [address] Phone: [phone]   Fax:  [phone]  Physical Therapy Treatment  Patient Details  Name: Lorraine Leonard MRN: [id number] Date of Birth: 07-22-1940 No Data Recorded  Encounter Date: 02/16/2015    Past Medical History  Diagnosis Date  . Hypertension   . Diabetes mellitus     pre-diabetic  . Allergic rhinitis   . Urticaria   . GERD (gastroesophageal reflux disease)   . 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 Diverticulosis   . Macular degeneration   . Depression   . Hypothyroidism   . Anxiety   . Anxiety   . Rheumatoid arthritis(714.0)   . Hyperlipidemia     Past Surgical History  Procedure Laterality Date  . Lumbar disc surgery      x 2  . 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 Vaginal hysterectomy    . Tubal ligation      There were no vitals filed for this visit.  Visit Diagnosis:  Left shoulder pain  Neck pain                                 PT Short Term Goals - 02/15/15 1431    PT SHORT TERM GOAL #1   Title Ind with HEP.   Time 2   Period Weeks   Status Achieved  02/15/15)           PT Long Term Goals - 02/01/15 1347    PT LONG TERM GOAL #1   Title Bilateral active cervical rotation= 60-65 degrees so she can turn her head more easily while driving.   Time 6   Period Weeks   Status New   PT LONG TERM GOAL #2   Title Perform ADL's wiht pain not > 3/10.   Time 6   Period Weeks   Status New   PT LONG TERM GOAL #3   Title Perform left shoulder functional antigravity movements with pain not > 3/10.               Problem List Patient Active Problem List   Diagnosis Date Noted  . Chronic urticaria 03/24/2015  . Metabolic syndrome 78/58/8502  . Single kidney 03/01/2015  . Macular degeneration of both eyes 11/28/2014  . Constipation 11/28/2014  . Chronic rhinitis 11/17/2014  . Hypertensive retinopathy  10/30/2014  . Vitamin D deficiency 08/17/2014  . Arthritis of knee 08/17/2014  . Hyperlipemia 08/17/2014  . Depression   . Hypothyroidism   . Anxiety   . Rheumatoid arthritis ([place])   . Cardiomegaly 05/05/2011  . Essential hypertension 02/08/2007  . 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 GASTROESOPHAGEAL REFLUX DISEASE 02/08/2007   PHYSICAL THERAPY DISCHARGE SUMMARY  Visits from Start of Care: 5  Current functional level related to goals / functional outcomes: Please see above.   Remaining deficits: Continued neck and left shoulder pain.   Education / Equipment: HEP.  Plan: Patient agrees to discharge.  Patient goals were not met. Patient is being discharged due to not returning since the last visit.  ?????      Kallee Nam, [place] MPT 06/16/2015, 5:21 PM  [facility] [address] [place], [place], [address] Phone: [phone]   Fax:  [phone]  Name: [name] MRN: [id number] Date of Birth: October 24, 1940

## 2015-06-21 DIAGNOSIS — L501 Idiopathic urticaria: Secondary | ICD-10-CM | POA: Diagnosis not present

## 2015-06-29 ENCOUNTER — Other Ambulatory Visit: Payer: Self-pay | Admitting: Family Medicine

## 2015-07-05 ENCOUNTER — Other Ambulatory Visit: Payer: Self-pay | Admitting: Family Medicine

## 2015-07-05 NOTE — Telephone Encounter (Signed)
Last seen 04/06/15  Dr Evette Doffing  Dr Livia Snellen  PCP  Print off approved

## 2015-07-07 ENCOUNTER — Telehealth: Payer: Self-pay | Admitting: Family Medicine

## 2015-07-07 ENCOUNTER — Other Ambulatory Visit: Payer: Self-pay | Admitting: Family Medicine

## 2015-07-07 NOTE — Telephone Encounter (Signed)
PATIENT NOTIFIED  CALLED [facility]

## 2015-07-07 NOTE — Telephone Encounter (Signed)
PLEASE ADDDRESS

## 2015-07-07 NOTE — Telephone Encounter (Signed)
Okay to call in # [phone]

## 2015-07-11 ENCOUNTER — Other Ambulatory Visit: Payer: Self-pay | Admitting: Nurse Practitioner

## 2015-07-11 MED ORDER — TRAMADOL HCL 50 MG PO TABS: 50.0000 mg | ORAL_TABLET | Freq: Four times a day (QID) | ORAL | Status: DC | PRN

## 2015-07-11 NOTE — Telephone Encounter (Signed)
Pt notified Rx for Tramadol is ready for pick up RX to front desk for pt pick up

## 2015-07-11 NOTE — Addendum Note (Signed)
Addended by: Thana Ates on: 07/11/2015 09:44 AM   Modules accepted: Orders

## 2015-07-11 NOTE — Telephone Encounter (Signed)
This will have to be be forwarded to Dr. Livia Snellen.

## 2015-07-12 ENCOUNTER — Ambulatory Visit (INDEPENDENT_AMBULATORY_CARE_PROVIDER_SITE_OTHER): Payer: Medicare Other | Admitting: Family Medicine

## 2015-07-12 ENCOUNTER — Encounter: Payer: Self-pay | Admitting: Family Medicine

## 2015-07-12 VITALS — BP 162/69 | HR 47 | Temp 97.7°F | Ht 67.0 in | Wt 235.8 lb

## 2015-07-12 DIAGNOSIS — J019 Acute sinusitis, unspecified: Secondary | ICD-10-CM

## 2015-07-12 DIAGNOSIS — R1013 Epigastric pain: Secondary | ICD-10-CM

## 2015-07-12 DIAGNOSIS — K219 Gastro-esophageal reflux disease without esophagitis: Secondary | ICD-10-CM

## 2015-07-12 MED ORDER — DEXLANSOPRAZOLE 30 MG PO CPDR: 30.0000 mg | DELAYED_RELEASE_CAPSULE | Freq: Every day | ORAL | Status: DC

## 2015-07-12 MED ORDER — FLUTICASONE PROPIONATE 50 MCG/ACT NA SUSP: 1.0000 | Freq: Two times a day (BID) | NASAL | Status: DC | PRN

## 2015-07-12 NOTE — Progress Notes (Signed)
BP 162/69 mmHg  Pulse 47  Temp(Src) 97.7 F (36.5 C) (Oral)  Ht '5\' 7"'$  (1.702 m)  Wt 235 lb 12.8 oz (106.958 kg)  BMI 36.92 kg/m2   Subjective:    Patient ID: Lorraine Leonard, female    DOB: 11-Mar-1941, 75 y.o.   MRN: [id number]  HPI: Lorraine Leonard is a 75 y.o. female presenting on 07/12/2015 for Nausea and Abdominal Pain   HPI Epigastric abdominal pain and nausea Patient has been having epigastric abdominal pain with belching and nausea and metallic taste in her mouth. She says she doesn't have any burning going up into her chest or throat she has previously. She is currently on Nexium and has been on it for quite some time. She thinks it may not be working as well as it was previously. She denies any vomiting with blood she denies any blood in her stool. She does have a history of being constipated but she did have a good bowel movement yesterday and does not feel constipated her back that today. She denies any shortness of breath or wheezing or palpitations.  Sinus congestion and dizziness Patient has been having sinus congestion and dizziness that has been going on since a week ago. She feels especially dizzy when she bends forward. She is also been having some postnasal drainage and congestion. She has a chronic history of allergies and is on Astelin spray. She is also been taking some Allegra as well.  Relevant past medical, surgical, family and social history reviewed and updated as indicated. Interim medical history since our last visit reviewed. Allergies and medications reviewed and updated.  Review of Systems  Constitutional: Negative for fever and chills.  HENT: Positive for postnasal drip, rhinorrhea, sinus pressure, sneezing and sore throat. Negative for congestion, ear discharge and ear pain.   Eyes: Negative for pain, redness and visual disturbance.  Respiratory: Negative for chest tightness and shortness of breath.   Cardiovascular: Negative for chest pain and leg  swelling.  Gastrointestinal: Positive for nausea and abdominal pain. Negative for vomiting, diarrhea, constipation, blood in stool, abdominal distention, anal bleeding and rectal pain.  Genitourinary: Negative for dysuria and difficulty urinating.  Musculoskeletal: Negative for back pain and gait problem.  Skin: Negative for rash.  Neurological: Negative for light-headedness and headaches.  Psychiatric/Behavioral: Negative for behavioral problems and agitation.  All other systems reviewed and are negative.   Per HPI unless specifically indicated above     Medication List       This list is accurate as of: 07/12/15  2:14 PM.  Always use your most recent med list.               aspirin 81 MG tablet  Take 81 mg by mouth daily.     azelastine 0.1 % nasal spray  Commonly known as:  ASTELIN  PLACE 2 SPRAYS INTO BOTH NOSTRILS 2 (TWO) TIMES DAILY AS NEEDED.     calcium citrate 950 MG tablet  Commonly known as:  CALCITRATE - dosed in mg elemental calcium  Take 1 tablet by mouth daily.     conjugated estrogens vaginal cream  Commonly known as:  PREMARIN  Place 0.5 g vaginally daily. Use as directed     cyanocobalamin 500 MCG tablet  Take 2,000 mcg by mouth daily.     Dexlansoprazole 30 MG capsule  Take 1 capsule (30 mg total) by mouth daily.     diazepam 5 MG tablet  Commonly known as:  VALIUM  TAKE 1 TABLET 2 TIMES A DAY AS NEEDED FOR ANXIETY     fluticasone 50 MCG/ACT nasal spray  Commonly known as:  FLONASE  Place 1 spray into both nostrils 2 (two) times daily as needed for allergies or rhinitis.     levothyroxine 88 MCG tablet  Commonly known as:  SYNTHROID, LEVOTHROID  TAKE 1 TABLET (88 MCG TOTAL) BY MOUTH DAILY BEFORE BREAKFAST.     metoprolol succinate 50 MG 24 hr tablet  Commonly known as:  TOPROL-XL  Take 1 tablet (50 mg total) by mouth daily. Take with or immediately following a meal.     mometasone 0.1 % cream  Commonly known as:  ELOCON  APPLY TO  AFFECTED AREA DAILY     moxifloxacin 0.5 % ophthalmic solution  Commonly known as:  VIGAMOX  Place 1 drop into the right eye See admin instructions. Pt takes three times daily for 2 days after eye injection     NEXIUM 40 MG capsule  Generic drug:  esomeprazole  TAKE 1 CAPSULE (40 MG TOTAL) BY MOUTH DAILY BEFORE BREAKFAST.     nitrofurantoin (macrocrystal-monohydrate) 100 MG capsule  Commonly known as:  MACROBID  Take 1 capsule (100 mg total) by mouth 2 (two) times daily. 1 po BId     ondansetron 8 MG tablet  Commonly known as:  ZOFRAN  TAKE 1 TABLET EVERY 8 HOURS AS NEEDED FOR NAUSEA     PATADAY 0.2 % Soln  Generic drug:  Olopatadine HCl  Place 1 drop into both eyes daily.     polyethylene glycol powder powder  Commonly known as:  GLYCOLAX/MIRALAX  TAKE 17 G BY MOUTH 2 (TWO) TIMES DAILY AS NEEDED.     PROAIR HFA 108 (90 Base) MCG/ACT inhaler  Generic drug:  albuterol  INHALE 2 PUFFS INTO THE LUNGS EVERY 6 (SIX) HOURS AS NEEDED FOR WHEEZING.     simvastatin 40 MG tablet  Commonly known as:  ZOCOR  TAKE 1 TABLET (40 MG TOTAL) BY MOUTH AT BEDTIME.     traMADol 50 MG tablet  Commonly known as:  ULTRAM  Take 1 tablet (50 mg total) by mouth every 6 (six) hours as needed.     trimethoprim 100 MG tablet  Commonly known as:  TRIMPEX  Take 100 mg by mouth daily.     valsartan-hydrochlorothiazide 160-25 MG tablet  Commonly known as:  DIOVAN HCT  Take 1 tablet by mouth daily.     vitamin C 500 MG tablet  Commonly known as:  ASCORBIC ACID  Take 500 mg by mouth daily.     Vitamin D 2000 units Caps  Take 1 capsule by mouth daily.     vitamin E 400 UNIT capsule  Take 400 Units by mouth daily.     VOLTAREN 1 % Gel  Generic drug:  diclofenac sodium  APPLY 2 GRAMS TOPICALLY 4 (FOUR) TIMES DAILY.     XOLAIR 150 MG injection  Generic drug:  omalizumab  Inject 150 mg into the skin every 28 (twenty-eight) days.           Objective:    BP 162/69 mmHg  Pulse 47   Temp(Src) 97.7 F (36.5 C) (Oral)  Ht '5\' 7"'$  (1.702 m)  Wt 235 lb 12.8 oz (106.958 kg)  BMI 36.92 kg/m2  Wt Readings from Last 3 Encounters:  07/12/15 235 lb 12.8 oz (106.958 kg)  04/06/15 235 lb (106.595 kg)  03/24/15 235 lb (106.595 kg)    Physical Exam  Constitutional: She is oriented to person, place, and time. She appears well-developed and well-nourished. No distress.  HENT:  Right Ear: Tympanic membrane, external ear and ear canal normal.  Left Ear: Tympanic membrane, external ear and ear canal normal.  Nose: Mucosal edema and rhinorrhea present. No epistaxis. Right sinus exhibits maxillary sinus tenderness. Right sinus exhibits no frontal sinus tenderness. Left sinus exhibits maxillary sinus tenderness. Left sinus exhibits no frontal sinus tenderness.  Mouth/Throat: Uvula is midline and mucous membranes are normal. No oropharyngeal exudate, posterior oropharyngeal edema, posterior oropharyngeal erythema or tonsillar abscesses.  Eyes: Conjunctivae and EOM are normal.  Neck: Neck supple. No thyromegaly present.  Cardiovascular: Normal rate, regular rhythm, normal heart sounds and intact distal pulses.   No murmur heard. Pulmonary/Chest: Effort normal and breath sounds normal. No respiratory distress. She has no wheezes.  Abdominal: Soft. Bowel sounds are normal. She exhibits no distension and no mass. There is tenderness in the epigastric area. There is no rigidity, no rebound, no guarding, no CVA tenderness, no tenderness at McBurney's point and negative Murphy's sign.  Musculoskeletal: Normal range of motion. She exhibits no edema or tenderness.  Lymphadenopathy:    She has no cervical adenopathy.  Neurological: She is alert and oriented to person, place, and time. Coordination normal.  Skin: Skin is warm and dry. No rash noted. She is not diaphoretic.  Psychiatric: She has a normal mood and affect. Her behavior is normal.  Vitals reviewed.     Assessment & Plan:   Problem  List Items Addressed This Visit      Digestive   GASTROESOPHAGEAL REFLUX DISEASE   Relevant Medications   Dexlansoprazole 30 MG capsule   Other Relevant Orders   Ambulatory referral to Gastroenterology    Other Visit Diagnoses    Abdominal pain, epigastric    -  Primary    Relevant Medications    Dexlansoprazole 30 MG capsule    Other Relevant Orders    CBC with Differential/Platelet    Lipase    CMP14+EGFR    Acute rhinosinusitis        Relevant Medications    fluticasone (FLONASE) 50 MCG/ACT nasal spray       Follow up plan: Return in about 4 weeks (around 08/09/2015), or if symptoms worsen or fail to improve, for Follow-up abdominal pain with PCP.  Counseling provided for all of the vaccine components Orders Placed This Encounter  Procedures  . CBC with Differential/Platelet  . Lipase  . CMP14+EGFR  . Ambulatory referral to Gastroenterology    Caryl Pina, MD [facility] 07/12/2015, 2:14 PM

## 2015-07-13 LAB — CMP14+EGFR
ALT: 18 IU/L (ref 0–32)
AST: 28 IU/L (ref 0–40)
Albumin/Globulin Ratio: 1.2 (ref 1.2–2.2)
Albumin: 4.2 g/dL (ref 3.5–4.8)
Alkaline Phosphatase: 94 IU/L (ref 39–117)
BUN/Creatinine Ratio: 14 (ref 11–26)
BUN: 14 mg/dL (ref 8–27)
Bilirubin Total: 0.3 mg/dL (ref 0.0–1.2)
CO2: 27 mmol/L (ref 18–29)
Calcium: 10.3 mg/dL (ref 8.7–10.3)
Chloride: 97 mmol/L (ref 96–106)
Creatinine, Ser: 0.97 mg/dL (ref 0.57–1.00)
GFR calc Af Amer: 66 mL/min/{1.73_m2} (ref >59)
GFR calc non Af Amer: 57 mL/min/{1.73_m2} — ABNORMAL LOW (ref >59)
Globulin, Total: 3.4 g/dL (ref 1.5–4.5)
Glucose: 80 mg/dL (ref 65–99)
Potassium: 5.1 mmol/L (ref 3.5–5.2)
Sodium: 141 mmol/L (ref 134–144)
Total Protein: 7.6 g/dL (ref 6.0–8.5)

## 2015-07-13 LAB — CBC WITH DIFFERENTIAL/PLATELET
Basophils Absolute: 0 10*3/uL (ref 0.0–0.2)
Basos: 0 %
EOS (ABSOLUTE): 0.3 10*3/uL (ref 0.0–0.4)
Eos: 2 %
Hematocrit: 40.4 % (ref 34.0–46.6)
Hemoglobin: 13.3 g/dL (ref 11.1–15.9)
Immature Grans (Abs): 0 10*3/uL (ref 0.0–0.1)
Immature Granulocytes: 0 %
Lymphocytes Absolute: 4.2 10*3/uL — ABNORMAL HIGH (ref 0.7–3.1)
Lymphs: 33 %
MCH: 28.6 pg (ref 26.6–33.0)
MCHC: 32.9 g/dL (ref 31.5–35.7)
MCV: 87 fL (ref 79–97)
Monocytes Absolute: 1.3 10*3/uL — ABNORMAL HIGH (ref 0.1–0.9)
Monocytes: 10 %
Neutrophils Absolute: 6.7 10*3/uL (ref 1.4–7.0)
Neutrophils: 55 %
Platelets: 321 10*3/uL (ref 150–379)
RBC: 4.65 x10E6/uL (ref 3.77–5.28)
RDW: 15.2 % (ref 12.3–15.4)
WBC: 12.6 10*3/uL — ABNORMAL HIGH (ref 3.4–10.8)

## 2015-07-13 LAB — LIPASE: Lipase: 41 U/L (ref 0–59)

## 2015-07-16 DIAGNOSIS — H1045 Other chronic allergic conjunctivitis: Secondary | ICD-10-CM | POA: Diagnosis not present

## 2015-07-16 DIAGNOSIS — J301 Allergic rhinitis due to pollen: Secondary | ICD-10-CM | POA: Diagnosis not present

## 2015-07-16 DIAGNOSIS — L501 Idiopathic urticaria: Secondary | ICD-10-CM | POA: Diagnosis not present

## 2015-07-16 DIAGNOSIS — J4520 Mild intermittent asthma, uncomplicated: Secondary | ICD-10-CM | POA: Diagnosis not present

## 2015-07-17 ENCOUNTER — Ambulatory Visit (INDEPENDENT_AMBULATORY_CARE_PROVIDER_SITE_OTHER): Payer: Medicare Other | Admitting: Family

## 2015-07-17 ENCOUNTER — Encounter: Payer: Self-pay | Admitting: Family

## 2015-07-17 VITALS — BP 154/62 | HR 56 | Temp 97.0°F | Ht 67.0 in | Wt 235.2 lb

## 2015-07-17 DIAGNOSIS — I10 Essential (primary) hypertension: Secondary | ICD-10-CM

## 2015-07-17 DIAGNOSIS — E8881 Metabolic syndrome: Secondary | ICD-10-CM

## 2015-07-17 DIAGNOSIS — F329 Major depressive disorder, single episode, unspecified: Secondary | ICD-10-CM

## 2015-07-17 DIAGNOSIS — F419 Anxiety disorder, unspecified: Secondary | ICD-10-CM

## 2015-07-17 DIAGNOSIS — M0579 Rheumatoid arthritis with rheumatoid factor of multiple sites without organ or systems involvement: Secondary | ICD-10-CM

## 2015-07-17 DIAGNOSIS — K5901 Slow transit constipation: Secondary | ICD-10-CM | POA: Diagnosis not present

## 2015-07-17 DIAGNOSIS — E785 Hyperlipidemia, unspecified: Secondary | ICD-10-CM | POA: Diagnosis not present

## 2015-07-17 DIAGNOSIS — E559 Vitamin D deficiency, unspecified: Secondary | ICD-10-CM

## 2015-07-17 DIAGNOSIS — E039 Hypothyroidism, unspecified: Secondary | ICD-10-CM | POA: Diagnosis not present

## 2015-07-17 DIAGNOSIS — Z905 Acquired absence of kidney: Secondary | ICD-10-CM | POA: Diagnosis not present

## 2015-07-17 DIAGNOSIS — J310 Chronic rhinitis: Secondary | ICD-10-CM | POA: Diagnosis not present

## 2015-07-17 DIAGNOSIS — K219 Gastro-esophageal reflux disease without esophagitis: Secondary | ICD-10-CM

## 2015-07-17 DIAGNOSIS — F32A Depression, unspecified: Secondary | ICD-10-CM

## 2015-07-17 NOTE — Patient Instructions (Signed)
Health Maintenance, Female Adopting a healthy lifestyle and getting preventive care can go a long way to promote health and wellness. Talk with your health care provider about what schedule of regular examinations is right for you. This is a good chance for you to check in with your provider about disease prevention and staying healthy. In between checkups, there are plenty of things you can do on your own. Experts have done a lot of research about which lifestyle changes and preventive measures are most likely to keep you healthy. Ask your health care provider for more information. WEIGHT AND DIET  Eat a healthy diet  Be sure to include plenty of vegetables, fruits, low-fat dairy products, and lean protein.  Do not eat a lot of foods high in solid fats, added sugars, or salt.  Get regular exercise. This is one of the most important things you can do for your health.  Most adults should exercise for at least 150 minutes each week. The exercise should increase your heart rate and make you sweat (moderate-intensity exercise).  Most adults should also do strengthening exercises at least twice a week. This is in addition to the moderate-intensity exercise.  Maintain a healthy weight  Body mass index (BMI) is a measurement that can be used to identify possible weight problems. It estimates body fat based on height and weight. Your health care provider can help determine your BMI and help you achieve or maintain a healthy weight.  For females 20 years of age and older:   A BMI below 18.5 is considered underweight.  A BMI of 18.5 to 24.9 is normal.  A BMI of 25 to 29.9 is considered overweight.  A BMI of 30 and above is considered obese.  Watch levels of cholesterol and blood lipids  You should start having your blood tested for lipids and cholesterol at 75 years of age, then have this test every 5 years.  You may need to have your cholesterol levels checked more often if:  Your lipid  or cholesterol levels are high.  You are older than 75 years of age.  You are at high risk for heart disease.  CANCER SCREENING   Lung Cancer  Lung cancer screening is recommended for adults 55-80 years old who are at high risk for lung cancer because of a history of smoking.  A yearly low-dose CT scan of the lungs is recommended for people who:  Currently smoke.  Have quit within the past 15 years.  Have at least a 30-pack-year history of smoking. A pack year is smoking an average of one pack of cigarettes a day for 1 year.  Yearly screening should continue until it has been 15 years since you quit.  Yearly screening should stop if you develop a health problem that would prevent you from having lung cancer treatment.  Breast Cancer  Practice breast self-awareness. This means understanding how your breasts normally appear and feel.  It also means doing regular breast self-exams. Let your health care provider know about any changes, no matter how small.  If you are in your 20s or 30s, you should have a clinical breast exam (CBE) by a health care provider every 1-3 years as part of a regular health exam.  If you are 40 or older, have a CBE every year. Also consider having a breast X-ray (mammogram) every year.  If you have a family history of breast cancer, talk to your health care provider about genetic screening.  If you   are at high risk for breast cancer, talk to your health care provider about having an MRI and a mammogram every year.  Breast cancer gene (BRCA) assessment is recommended for women who have family members with BRCA-related cancers. BRCA-related cancers include:  Breast.  Ovarian.  Tubal.  Peritoneal cancers.  Results of the assessment will determine the need for genetic counseling and BRCA1 and BRCA2 testing. Cervical Cancer Your health care provider may recommend that you be screened regularly for cancer of the pelvic organs (ovaries, uterus, and  vagina). This screening involves a pelvic examination, including checking for microscopic changes to the surface of your cervix (Pap test). You may be encouraged to have this screening done every 3 years, beginning at age 21.  For women ages 30-65, health care providers may recommend pelvic exams and Pap testing every 3 years, or they may recommend the Pap and pelvic exam, combined with testing for human papilloma virus (HPV), every 5 years. Some types of HPV increase your risk of cervical cancer. Testing for HPV may also be done on women of any age with unclear Pap test results.  Other health care providers may not recommend any screening for nonpregnant women who are considered low risk for pelvic cancer and who do not have symptoms. Ask your health care provider if a screening pelvic exam is right for you.  If you have had past treatment for cervical cancer or a condition that could lead to cancer, you need Pap tests and screening for cancer for at least 20 years after your treatment. If Pap tests have been discontinued, your risk factors (such as having a new sexual partner) need to be reassessed to determine if screening should resume. Some women have medical problems that increase the chance of getting cervical cancer. In these cases, your health care provider may recommend more frequent screening and Pap tests. Colorectal Cancer  This type of cancer can be detected and often prevented.  Routine colorectal cancer screening usually begins at 75 years of age and continues through 75 years of age.  Your health care provider may recommend screening at an earlier age if you have risk factors for colon cancer.  Your health care provider may also recommend using home test kits to check for hidden blood in the stool.  A small camera at the end of a tube can be used to examine your colon directly (sigmoidoscopy or colonoscopy). This is done to check for the earliest forms of colorectal  cancer.  Routine screening usually begins at age 50.  Direct examination of the colon should be repeated every 5-10 years through 75 years of age. However, you may need to be screened more often if early forms of precancerous polyps or small growths are found. Skin Cancer  Check your skin from head to toe regularly.  Tell your health care provider about any new moles or changes in moles, especially if there is a change in a mole's shape or color.  Also tell your health care provider if you have a mole that is larger than the size of a pencil eraser.  Always use sunscreen. Apply sunscreen liberally and repeatedly throughout the day.  Protect yourself by wearing long sleeves, pants, a wide-brimmed hat, and sunglasses whenever you are outside. HEART DISEASE, DIABETES, AND HIGH BLOOD PRESSURE   High blood pressure causes heart disease and increases the risk of stroke. High blood pressure is more likely to develop in:  People who have blood pressure in the high end   of the normal range (130-139/85-89 mm Hg).  People who are overweight or obese.  People who are African American.  If you are 38-23 years of age, have your blood pressure checked every 3-5 years. If you are 61 years of age or older, have your blood pressure checked every year. You should have your blood pressure measured twice--once when you are at a hospital or clinic, and once when you are not at a hospital or clinic. Record the average of the two measurements. To check your blood pressure when you are not at a hospital or clinic, you can use:  An automated blood pressure machine at a pharmacy.  A home blood pressure monitor.  If you are between 45 years and 39 years old, ask your health care provider if you should take aspirin to prevent strokes.  Have regular diabetes screenings. This involves taking a blood sample to check your fasting blood sugar level.  If you are at a normal weight and have a low risk for diabetes,  have this test once every three years after 75 years of age.  If you are overweight and have a high risk for diabetes, consider being tested at a younger age or more often. PREVENTING INFECTION  Hepatitis B  If you have a higher risk for hepatitis B, you should be screened for this virus. You are considered at high risk for hepatitis B if:  You were born in a country where hepatitis B is common. Ask your health care provider which countries are considered high risk.  Your parents were born in a high-risk country, and you have not been immunized against hepatitis B (hepatitis B vaccine).  You have HIV or AIDS.  You use needles to inject street drugs.  You live with someone who has hepatitis B.  You have had sex with someone who has hepatitis B.  You get hemodialysis treatment.  You take certain medicines for conditions, including cancer, organ transplantation, and autoimmune conditions. Hepatitis C  Blood testing is recommended for:  Everyone born from [birth year] through 1965.  Anyone with known risk factors for hepatitis C. Sexually transmitted infections (STIs)  You should be screened for sexually transmitted infections (STIs) including gonorrhea and chlamydia if:  You are sexually active and are younger than 75 years of age.  You are older than 75 years of age and your health care provider tells you that you are at risk for this type of infection.  Your sexual activity has changed since you were last screened and you are at an increased risk for chlamydia or gonorrhea. Ask your health care provider if you are at risk.  If you do not have HIV, but are at risk, it may be recommended that you take a prescription medicine daily to prevent HIV infection. This is called pre-exposure prophylaxis (PrEP). You are considered at risk if:  You are sexually active and do not regularly use condoms or know the HIV status of your partner(s).  You take drugs by injection.  You are sexually  active with a partner who has HIV. Talk with your health care provider about whether you are at high risk of being infected with HIV. If you choose to begin PrEP, you should first be tested for HIV. You should then be tested every 3 months for as long as you are taking PrEP.  PREGNANCY   If you are premenopausal and you may become pregnant, ask your health care provider about preconception counseling.  If you may  become pregnant, take 400 to 800 micrograms (mcg) of folic acid every day.  If you want to prevent pregnancy, talk to your health care provider about birth control (contraception). OSTEOPOROSIS AND MENOPAUSE   Osteoporosis is a disease in which the bones lose minerals and strength with aging. This can result in serious bone fractures. Your risk for osteoporosis can be identified using a bone density scan.  If you are 61 years of age or older, or if you are at risk for osteoporosis and fractures, ask your health care provider if you should be screened.  Ask your health care provider whether you should take a calcium or vitamin D supplement to lower your risk for osteoporosis.  Menopause may have certain physical symptoms and risks.  Hormone replacement therapy may reduce some of these symptoms and risks. Talk to your health care provider about whether hormone replacement therapy is right for you.  HOME CARE INSTRUCTIONS   Schedule regular health, dental, and eye exams.  Stay current with your immunizations.   Do not use any tobacco products including cigarettes, chewing tobacco, or electronic cigarettes.  If you are pregnant, do not drink alcohol.  If you are breastfeeding, limit how much and how often you drink alcohol.  Limit alcohol intake to no more than 1 drink per day for nonpregnant women. One drink equals 12 ounces of beer, 5 ounces of wine, or 1 ounces of hard liquor.  Do not use street drugs.  Do not share needles.  Ask your health care provider for help if  you need support or information about quitting drugs.  Tell your health care provider if you often feel depressed.  Tell your health care provider if you have ever been abused or do not feel safe at home.   This information is not intended to replace advice given to you by your health care provider. Make sure you discuss any questions you have with your health care provider.   Document Released: 10/21/2010 Document Revised: 04/28/2014 Document Reviewed: 03/09/2013 Elsevier Interactive Patient Education [employer].

## 2015-07-17 NOTE — Progress Notes (Signed)
Subjective:    Patient ID: Lorraine Leonard, female    DOB: February 06, 1941, 75 y.o.   MRN: [id number]  Pt presents to the office today for chronic follow up. PT is followed by a pulmonologist once a year to monitor  pulmonary nodule.  Hypertension This is a chronic problem. The current episode started more than 1 year ago. The problem has been waxing and waning since onset. The problem is controlled. Associated symptoms include anxiety and shortness of breath. Pertinent negatives include no headaches, palpitations or peripheral edema. Risk factors for coronary artery disease include dyslipidemia, family history, obesity, post-menopausal state and sedentary lifestyle. Past treatments include angiotensin blockers and diuretics. The current treatment provides moderate improvement. Hypertensive end-organ damage includes a thyroid problem. There is no history of kidney disease, CAD/MI, CVA or heart failure.  Anxiety Presents for follow-up visit. Onset was 1 to 6 months ago. The problem has been waxing and waning. Symptoms include depressed mood, excessive worry, nausea, nervous/anxious behavior and shortness of breath. Patient reports no insomnia, palpitations or suicidal ideas. Symptoms occur most days. The symptoms are aggravated by family issues. The quality of sleep is good.   Her past medical history is significant for anxiety/panic attacks and depression.  Hyperlipidemia This is a chronic problem. The current episode started more than 1 year ago. The problem is controlled. Recent lipid tests were reviewed and are normal. Exacerbating diseases include obesity. She has no history of diabetes. Associated symptoms include shortness of breath. Current antihyperlipidemic treatment includes statins. The current treatment provides significant improvement of lipids. Risk factors for coronary artery disease include dyslipidemia, hypertension, obesity and post-menopausal.  Constipation This is a chronic problem. The  current episode started more than 1 year ago. The problem has been resolved since onset. Her stool frequency is 4 to 5 times per week. Associated symptoms include nausea. Pertinent negatives include no diarrhea, fecal incontinence or vomiting. She has tried laxatives for the symptoms. The treatment provided significant relief.  Arthritis Presents for follow-up visit. The disease course has been stable. She complains of pain, stiffness and joint swelling. Affected locations include the left MCP, left wrist, right wrist and right MCP. Her pain is at a severity of 5/10. Associated symptoms include fatigue. Pertinent negatives include no diarrhea. Her past medical history is significant for osteoarthritis and rheumatoid arthritis. Her pertinent risk factors include overuse. Past treatments include rest and corticosteroids. The treatment provided significant relief. Compliance with prior treatments has been good.  Thyroid Problem Presents for follow-up visit. Symptoms include anxiety, constipation, depressed mood and fatigue. Patient reports no diarrhea, hoarse voice, leg swelling or palpitations. The symptoms have been stable. Past treatments include levothyroxine. The treatment provided significant relief. Her past medical history is significant for hyperlipidemia. There is no history of diabetes or heart failure.  Depression      The patient presents with depression.  This is a chronic problem.  The current episode started more than 1 year ago.   The onset quality is gradual.   The problem occurs intermittently.  The problem has been waxing and waning since onset.  Associated symptoms include fatigue.  Associated symptoms include does not have insomnia, not irritable, no headaches and no suicidal ideas.  Past medical history includes thyroid problem, anxiety and depression.   Gastroesophageal Reflux She complains of nausea. She reports no belching, no coughing, no heartburn or no hoarse voice. This is a  chronic problem. The current episode started more than 1 year ago. The problem  occurs rarely. The problem has been resolved. The symptoms are aggravated by lying down. Associated symptoms include fatigue. Risk factors include obesity. She has tried a PPI for the symptoms. The treatment provided significant relief.      Review of Systems  Constitutional: Positive for fatigue.  HENT: Negative.  Negative for hoarse voice.   Eyes: Negative.   Respiratory: Positive for shortness of breath. Negative for cough.   Cardiovascular: Negative.  Negative for palpitations.  Gastrointestinal: Positive for nausea and constipation. Negative for heartburn, vomiting and diarrhea.  Endocrine: Negative.   Genitourinary: Negative.   Musculoskeletal: Positive for joint swelling, arthritis and stiffness.  Neurological: Negative.  Negative for headaches.  Hematological: Negative.   Psychiatric/Behavioral: Positive for depression. Negative for suicidal ideas. The patient is nervous/anxious. The patient does not have insomnia.   All other systems reviewed and are negative.      Objective:   Physical Exam  Constitutional: She is oriented to person, place, and time. She appears well-developed and well-nourished. She is not irritable. No distress.  HENT:  Head: Normocephalic and atraumatic.  Right Ear: External ear normal.  Left Ear: External ear normal.  Nose: Nose normal.  Mouth/Throat: Oropharynx is clear and moist.  Eyes: Pupils are equal, round, and reactive to light.  Neck: Normal range of motion. Neck supple. No thyromegaly present.  Cardiovascular: Normal rate, regular rhythm, normal heart sounds and intact distal pulses.   No murmur heard. Pulmonary/Chest: Effort normal and breath sounds normal. No respiratory distress. She has no wheezes.  Abdominal: Soft. Bowel sounds are normal. She exhibits no distension. There is no tenderness.  Musculoskeletal: Normal range of motion. She exhibits no edema or  tenderness.  Neurological: She is alert and oriented to person, place, and time. She has normal reflexes. No cranial nerve deficit.  Skin: Skin is warm and dry.  Psychiatric: She has a normal mood and affect. Her behavior is normal. Judgment and thought content normal.  Vitals reviewed.   BP 154/62 mmHg  Pulse 56  Temp(Src) 97 F (36.1 C) (Oral)  Ht 5' 7" (1.702 m)  Wt 235 lb 3.2 oz (106.686 kg)  BMI 36.83 kg/m2       Assessment & Plan:  1. Essential hypertension - CMP14+EGFR  2. Chronic rhinitis - CMP14+EGFR  3. Gastroesophageal reflux disease, esophagitis presence not specified - CMP14+EGFR  4. Slow transit constipation - CMP14+EGFR  5. Hypothyroidism, unspecified hypothyroidism type - CMP14+EGFR - Thyroid Panel With TSH  6. Rheumatoid arthritis involving multiple sites with positive rheumatoid factor (HCC) - CMP14+EGFR - Arthritis Panel  7. Single kidney - EFU07+KTCC  8. Metabolic syndrome - EQF37+OUZH  9. Hyperlipemia - CMP14+EGFR - Lipid panel  10. Vitamin D deficiency - CMP14+EGFR - VITAMIN D 25 Hydroxy (Vit-D Deficiency, Fractures)  11. Anxiety - CMP14+EGFR  12. Depression - CMP14+EGFR   Continue all meds Labs pending Health Maintenance reviewed Diet and exercise encouraged RTO 4 months  Evelina Dun, FNP

## 2015-07-18 LAB — ARTHRITIS PANEL
Basophils Absolute: 0 10*3/uL (ref 0.0–0.2)
Basos: 0 %
EOS (ABSOLUTE): 0 10*3/uL (ref 0.0–0.4)
Eos: 0 %
Hematocrit: 40.4 % (ref 34.0–46.6)
Hemoglobin: 13.2 g/dL (ref 11.1–15.9)
Immature Grans (Abs): 0.1 10*3/uL (ref 0.0–0.1)
Immature Granulocytes: 1 %
Lymphocytes Absolute: 1.9 10*3/uL (ref 0.7–3.1)
Lymphs: 14 %
MCH: 29.3 pg (ref 26.6–33.0)
MCHC: 32.7 g/dL (ref 31.5–35.7)
MCV: 90 fL (ref 79–97)
Monocytes Absolute: 0.4 10*3/uL (ref 0.1–0.9)
Monocytes: 3 %
Neutrophils Absolute: 11.6 10*3/uL — ABNORMAL HIGH (ref 1.4–7.0)
Neutrophils: 82 %
Platelets: 308 10*3/uL (ref 150–379)
RBC: 4.51 x10E6/uL (ref 3.77–5.28)
RDW: 15.1 % (ref 12.3–15.4)
Rhuematoid fact SerPl-aCnc: 27.8 IU/mL — ABNORMAL HIGH (ref 0.0–13.9)
Sed Rate: 24 mm/hr (ref 0–40)
Uric Acid: 7.3 mg/dL — ABNORMAL HIGH (ref 2.5–7.1)
WBC: 14.1 10*3/uL — ABNORMAL HIGH (ref 3.4–10.8)

## 2015-07-18 LAB — CMP14+EGFR
ALT: 15 IU/L (ref 0–32)
AST: 20 IU/L (ref 0–40)
Albumin/Globulin Ratio: 1.5 (ref 1.2–2.2)
Albumin: 4.4 g/dL (ref 3.5–4.8)
Alkaline Phosphatase: 88 IU/L (ref 39–117)
BUN/Creatinine Ratio: 15 (ref 11–26)
BUN: 15 mg/dL (ref 8–27)
Bilirubin Total: 0.3 mg/dL (ref 0.0–1.2)
CO2: 26 mmol/L (ref 18–29)
Calcium: 10 mg/dL (ref 8.7–10.3)
Chloride: 99 mmol/L (ref 96–106)
Creatinine, Ser: 0.98 mg/dL (ref 0.57–1.00)
GFR calc Af Amer: 65 mL/min/{1.73_m2} (ref >59)
GFR calc non Af Amer: 57 mL/min/{1.73_m2} — ABNORMAL LOW (ref >59)
Globulin, Total: 3 g/dL (ref 1.5–4.5)
Glucose: 150 mg/dL — ABNORMAL HIGH (ref 65–99)
Potassium: 5.5 mmol/L — ABNORMAL HIGH (ref 3.5–5.2)
Sodium: 143 mmol/L (ref 134–144)
Total Protein: 7.4 g/dL (ref 6.0–8.5)

## 2015-07-18 LAB — LIPID PANEL
Chol/HDL Ratio: 2.9 ratio units (ref 0.0–4.4)
Cholesterol, Total: 137 mg/dL (ref 100–199)
HDL: 47 mg/dL (ref >39)
LDL Calculated: 69 mg/dL (ref 0–99)
Triglycerides: 107 mg/dL (ref 0–149)
VLDL Cholesterol Cal: 21 mg/dL (ref 5–40)

## 2015-07-18 LAB — THYROID PANEL WITH TSH
Free Thyroxine Index: 1.3 (ref 1.2–4.9)
T3 Uptake Ratio: 25 % (ref 24–39)
T4, Total: 5 ug/dL (ref 4.5–12.0)
TSH: 1.06 u[IU]/mL (ref 0.450–4.500)

## 2015-07-18 LAB — VITAMIN D 25 HYDROXY (VIT D DEFICIENCY, FRACTURES): Vit D, 25-Hydroxy: 27.3 ng/mL — ABNORMAL LOW (ref 30.0–100.0)

## 2015-07-19 ENCOUNTER — Encounter: Payer: Self-pay | Admitting: *Deleted

## 2015-07-19 ENCOUNTER — Other Ambulatory Visit: Payer: Self-pay | Admitting: Family

## 2015-07-19 DIAGNOSIS — E875 Hyperkalemia: Secondary | ICD-10-CM

## 2015-07-19 DIAGNOSIS — R7689 Other specified abnormal immunological findings in serum: Secondary | ICD-10-CM

## 2015-07-19 DIAGNOSIS — R768 Other specified abnormal immunological findings in serum: Secondary | ICD-10-CM

## 2015-07-19 MED ORDER — COLCHICINE 0.6 MG PO TABS: ORAL_TABLET | ORAL | Status: DC

## 2015-07-19 MED ORDER — VITAMIN D (ERGOCALCIFEROL) 1.25 MG (50000 UNIT) PO CAPS: 50000.0000 [IU] | ORAL_CAPSULE | ORAL | Status: DC

## 2015-07-19 MED ORDER — ALLOPURINOL 100 MG PO TABS: 100.0000 mg | ORAL_TABLET | Freq: Every day | ORAL | Status: DC

## 2015-07-20 DIAGNOSIS — L501 Idiopathic urticaria: Secondary | ICD-10-CM | POA: Diagnosis not present

## 2015-07-23 ENCOUNTER — Encounter (INDEPENDENT_AMBULATORY_CARE_PROVIDER_SITE_OTHER): Payer: Medicare Other | Admitting: Ophthalmology

## 2015-07-23 DIAGNOSIS — E113213 Type 2 diabetes mellitus with mild nonproliferative diabetic retinopathy with macular edema, bilateral: Secondary | ICD-10-CM

## 2015-07-23 DIAGNOSIS — I10 Essential (primary) hypertension: Secondary | ICD-10-CM

## 2015-07-23 DIAGNOSIS — H43813 Vitreous degeneration, bilateral: Secondary | ICD-10-CM | POA: Diagnosis not present

## 2015-07-23 DIAGNOSIS — H353231 Exudative age-related macular degeneration, bilateral, with active choroidal neovascularization: Secondary | ICD-10-CM

## 2015-07-23 DIAGNOSIS — E11311 Type 2 diabetes mellitus with unspecified diabetic retinopathy with macular edema: Secondary | ICD-10-CM

## 2015-07-23 DIAGNOSIS — H35033 Hypertensive retinopathy, bilateral: Secondary | ICD-10-CM | POA: Diagnosis not present

## 2015-07-27 ENCOUNTER — Other Ambulatory Visit: Payer: Self-pay | Admitting: Family Medicine

## 2015-08-02 DIAGNOSIS — R1013 Epigastric pain: Secondary | ICD-10-CM | POA: Diagnosis not present

## 2015-08-02 DIAGNOSIS — K219 Gastro-esophageal reflux disease without esophagitis: Secondary | ICD-10-CM | POA: Diagnosis not present

## 2015-08-14 DIAGNOSIS — M79641 Pain in right hand: Secondary | ICD-10-CM | POA: Diagnosis not present

## 2015-08-14 DIAGNOSIS — M150 Primary generalized (osteo)arthritis: Secondary | ICD-10-CM | POA: Diagnosis not present

## 2015-08-14 DIAGNOSIS — M79671 Pain in right foot: Secondary | ICD-10-CM | POA: Diagnosis not present

## 2015-08-14 DIAGNOSIS — M2550 Pain in unspecified joint: Secondary | ICD-10-CM | POA: Diagnosis not present

## 2015-08-14 DIAGNOSIS — M79642 Pain in left hand: Secondary | ICD-10-CM | POA: Diagnosis not present

## 2015-08-14 DIAGNOSIS — M1A09X0 Idiopathic chronic gout, multiple sites, without tophus (tophi): Secondary | ICD-10-CM | POA: Diagnosis not present

## 2015-08-14 DIAGNOSIS — M79672 Pain in left foot: Secondary | ICD-10-CM | POA: Diagnosis not present

## 2015-08-16 DIAGNOSIS — L501 Idiopathic urticaria: Secondary | ICD-10-CM | POA: Diagnosis not present

## 2015-08-17 DIAGNOSIS — K2970 Gastritis, unspecified, without bleeding: Secondary | ICD-10-CM | POA: Diagnosis not present

## 2015-08-17 DIAGNOSIS — R1013 Epigastric pain: Secondary | ICD-10-CM | POA: Diagnosis not present

## 2015-08-17 DIAGNOSIS — K3189 Other diseases of stomach and duodenum: Secondary | ICD-10-CM | POA: Diagnosis not present

## 2015-08-17 DIAGNOSIS — K2950 Unspecified chronic gastritis without bleeding: Secondary | ICD-10-CM | POA: Diagnosis not present

## 2015-08-23 ENCOUNTER — Other Ambulatory Visit: Payer: Self-pay | Admitting: Family Medicine

## 2015-08-23 NOTE — Telephone Encounter (Signed)
Last seen 07/17/15  Lorraine Leonard   If approved route to nurse to call into [facility]

## 2015-08-29 ENCOUNTER — Other Ambulatory Visit: Payer: Self-pay | Admitting: Family

## 2015-09-03 ENCOUNTER — Other Ambulatory Visit: Payer: Self-pay | Admitting: Family

## 2015-09-03 ENCOUNTER — Other Ambulatory Visit: Payer: Medicare Other

## 2015-09-03 DIAGNOSIS — M1A09X0 Idiopathic chronic gout, multiple sites, without tophus (tophi): Secondary | ICD-10-CM | POA: Diagnosis not present

## 2015-09-03 DIAGNOSIS — M2550 Pain in unspecified joint: Secondary | ICD-10-CM | POA: Diagnosis not present

## 2015-09-04 ENCOUNTER — Encounter: Payer: Self-pay | Admitting: Family

## 2015-09-05 ENCOUNTER — Other Ambulatory Visit: Payer: Self-pay | Admitting: Family Medicine

## 2015-09-09 ENCOUNTER — Other Ambulatory Visit: Payer: Self-pay | Admitting: Family Medicine

## 2015-09-11 DIAGNOSIS — J4520 Mild intermittent asthma, uncomplicated: Secondary | ICD-10-CM | POA: Diagnosis not present

## 2015-09-11 DIAGNOSIS — L501 Idiopathic urticaria: Secondary | ICD-10-CM | POA: Diagnosis not present

## 2015-09-11 DIAGNOSIS — J301 Allergic rhinitis due to pollen: Secondary | ICD-10-CM | POA: Diagnosis not present

## 2015-09-11 DIAGNOSIS — H1045 Other chronic allergic conjunctivitis: Secondary | ICD-10-CM | POA: Diagnosis not present

## 2015-09-12 ENCOUNTER — Other Ambulatory Visit: Payer: Self-pay | Admitting: Family Medicine

## 2015-09-24 ENCOUNTER — Other Ambulatory Visit: Payer: Self-pay | Admitting: Family Medicine

## 2015-09-25 ENCOUNTER — Other Ambulatory Visit: Payer: Self-pay | Admitting: Gastroenterology

## 2015-09-25 DIAGNOSIS — K3189 Other diseases of stomach and duodenum: Secondary | ICD-10-CM | POA: Diagnosis not present

## 2015-09-25 DIAGNOSIS — L501 Idiopathic urticaria: Secondary | ICD-10-CM | POA: Diagnosis not present

## 2015-09-25 DIAGNOSIS — R1013 Epigastric pain: Secondary | ICD-10-CM | POA: Diagnosis not present

## 2015-09-26 ENCOUNTER — Encounter (HOSPITAL_COMMUNITY): Payer: Self-pay | Admitting: *Deleted

## 2015-09-27 DIAGNOSIS — M79671 Pain in right foot: Secondary | ICD-10-CM | POA: Diagnosis not present

## 2015-09-27 DIAGNOSIS — M2550 Pain in unspecified joint: Secondary | ICD-10-CM | POA: Diagnosis not present

## 2015-09-27 DIAGNOSIS — M79641 Pain in right hand: Secondary | ICD-10-CM | POA: Diagnosis not present

## 2015-09-27 DIAGNOSIS — M79642 Pain in left hand: Secondary | ICD-10-CM | POA: Diagnosis not present

## 2015-09-27 DIAGNOSIS — M1A09X0 Idiopathic chronic gout, multiple sites, without tophus (tophi): Secondary | ICD-10-CM | POA: Diagnosis not present

## 2015-09-27 DIAGNOSIS — M150 Primary generalized (osteo)arthritis: Secondary | ICD-10-CM | POA: Diagnosis not present

## 2015-09-27 DIAGNOSIS — M79672 Pain in left foot: Secondary | ICD-10-CM | POA: Diagnosis not present

## 2015-10-02 ENCOUNTER — Other Ambulatory Visit: Payer: Self-pay | Admitting: Gastroenterology

## 2015-10-03 ENCOUNTER — Encounter (HOSPITAL_COMMUNITY): Payer: Self-pay

## 2015-10-03 ENCOUNTER — Ambulatory Visit (HOSPITAL_COMMUNITY): Payer: Medicare Other | Admitting: Anesthesiology

## 2015-10-03 ENCOUNTER — Ambulatory Visit (HOSPITAL_COMMUNITY)
Admission: RE | Admit: 2015-10-03 | Discharge: 2015-10-03 | Disposition: A | Discharge status: Home / Self Care | Payer: Medicare Other | Source: Ambulatory Visit | Attending: Gastroenterology | Admitting: Gastroenterology

## 2015-10-03 ENCOUNTER — Encounter (HOSPITAL_COMMUNITY)
Admission: RE | Disposition: A | Discharge status: Home / Self Care | Payer: Self-pay | Source: Ambulatory Visit | Attending: Gastroenterology

## 2015-10-03 DIAGNOSIS — E039 Hypothyroidism, unspecified: Secondary | ICD-10-CM | POA: Diagnosis not present

## 2015-10-03 DIAGNOSIS — Z6836 Body mass index (BMI) 36.0-36.9, adult: Secondary | ICD-10-CM | POA: Diagnosis not present

## 2015-10-03 DIAGNOSIS — M069 Rheumatoid arthritis, unspecified: Secondary | ICD-10-CM | POA: Diagnosis not present

## 2015-10-03 DIAGNOSIS — J310 Chronic rhinitis: Secondary | ICD-10-CM | POA: Insufficient documentation

## 2015-10-03 DIAGNOSIS — I119 Hypertensive heart disease without heart failure: Secondary | ICD-10-CM | POA: Diagnosis not present

## 2015-10-03 DIAGNOSIS — K210 Gastro-esophageal reflux disease with esophagitis: Secondary | ICD-10-CM | POA: Insufficient documentation

## 2015-10-03 DIAGNOSIS — K869 Disease of pancreas, unspecified: Secondary | ICD-10-CM | POA: Diagnosis not present

## 2015-10-03 DIAGNOSIS — E785 Hyperlipidemia, unspecified: Secondary | ICD-10-CM | POA: Diagnosis not present

## 2015-10-03 DIAGNOSIS — R1013 Epigastric pain: Secondary | ICD-10-CM | POA: Diagnosis present

## 2015-10-03 DIAGNOSIS — E119 Type 2 diabetes mellitus without complications: Secondary | ICD-10-CM | POA: Diagnosis not present

## 2015-10-03 DIAGNOSIS — Z79899 Other long term (current) drug therapy: Secondary | ICD-10-CM | POA: Insufficient documentation

## 2015-10-03 DIAGNOSIS — I10 Essential (primary) hypertension: Secondary | ICD-10-CM | POA: Diagnosis not present

## 2015-10-03 DIAGNOSIS — K3189 Other diseases of stomach and duodenum: Secondary | ICD-10-CM | POA: Insufficient documentation

## 2015-10-03 DIAGNOSIS — R932 Abnormal findings on diagnostic imaging of liver and biliary tract: Secondary | ICD-10-CM | POA: Diagnosis not present

## 2015-10-03 DIAGNOSIS — Z7982 Long term (current) use of aspirin: Secondary | ICD-10-CM | POA: Diagnosis not present

## 2015-10-03 DIAGNOSIS — E669 Obesity, unspecified: Secondary | ICD-10-CM | POA: Diagnosis not present

## 2015-10-03 DIAGNOSIS — Z905 Acquired absence of kidney: Secondary | ICD-10-CM | POA: Insufficient documentation

## 2015-10-03 DIAGNOSIS — Z7951 Long term (current) use of inhaled steroids: Secondary | ICD-10-CM | POA: Diagnosis not present

## 2015-10-03 DIAGNOSIS — E559 Vitamin D deficiency, unspecified: Secondary | ICD-10-CM | POA: Diagnosis not present

## 2015-10-03 HISTORY — PX: FINE NEEDLE ASPIRATION: SHX5430

## 2015-10-03 HISTORY — PX: EUS: SHX5427

## 2015-10-03 LAB — GLUCOSE, CAPILLARY: Glucose-Capillary: 130 mg/dL — ABNORMAL HIGH (ref 65–99)

## 2015-10-03 SURGERY — UPPER ENDOSCOPIC ULTRASOUND (EUS) RADIAL
Anesthesia: Monitor Anesthesia Care

## 2015-10-03 MED ORDER — LIDOCAINE HCL (CARDIAC) 20 MG/ML IV SOLN
INTRAVENOUS | Status: DC | PRN
  Administered 2015-10-03: 50 mg via INTRAVENOUS

## 2015-10-03 MED ORDER — LACTATED RINGERS IV SOLN
INTRAVENOUS | Status: DC
  Administered 2015-10-03: 1000 mL via INTRAVENOUS

## 2015-10-03 MED ORDER — SODIUM CHLORIDE 0.9 % IV SOLN: INTRAVENOUS | Status: DC

## 2015-10-03 MED ORDER — PROPOFOL 500 MG/50ML IV EMUL
INTRAVENOUS | Status: DC | PRN
  Administered 2015-10-03: 50 ug/kg/min via INTRAVENOUS

## 2015-10-03 MED ORDER — PROPOFOL 10 MG/ML IV BOLUS
INTRAVENOUS | Status: AC
  Filled 2015-10-03: qty 40

## 2015-10-03 MED ORDER — PROPOFOL 10 MG/ML IV BOLUS
INTRAVENOUS | Status: DC | PRN
  Administered 2015-10-03: 20 mg via INTRAVENOUS
  Administered 2015-10-03: 30 mg via INTRAVENOUS

## 2015-10-03 MED ORDER — LIDOCAINE HCL (CARDIAC) 20 MG/ML IV SOLN
INTRAVENOUS | Status: AC
  Filled 2015-10-03: qty 5

## 2015-10-03 NOTE — Op Note (Signed)
[facility] Patient Name: Lorraine Leonard Procedure Date: 10/03/2015 MRN: [id number] Attending MD: Arta Silence , MD Date of Birth: Nov 04, 1940 CSN: [id number] Age: 75 Admit Type: Outpatient Procedure:                Upper EUS Indications:              Gastric mucosal mass/polyp found on endoscopy,                            Epigastric abdominal pain Providers:                Arta Silence, MD, Carolynn Comment, RN, Corliss Parish, Technician Referring MD:             Chipper Herb, MD; Teena Irani, MD Medicines:                Monitored Anesthesia Care Complications:            No immediate complications. Estimated Blood Loss:     Estimated blood loss: none. Procedure:                Pre-Anesthesia Assessment:                           - Prior to the procedure, a History and Physical                            was performed, and patient medications and                            allergies were reviewed. The patient's tolerance of                            previous anesthesia was also reviewed. The risks                            and benefits of the procedure and the sedation                            options and risks were discussed with the patient.                            All questions were answered, and informed consent                            was obtained. Prior Anticoagulants: The patient has                            taken aspirin. ASA Grade Assessment: III - A                            patient with severe systemic disease. After  reviewing the risks and benefits, the patient was                            deemed in satisfactory condition to undergo the                            procedure.                           After obtaining informed consent, the endoscope was                            passed under direct vision. Throughout the                            procedure, the patient's blood  pressure, pulse, and                            oxygen saturations were monitored continuously. The                            [id number]) scope was introduced through                            the mouth, and advanced to the second part of                            duodenum. The upper EUS was accomplished without                            difficulty. The patient tolerated the procedure                            well. Due to computer technical difficulties, EUS                            pictures were unable to be saved to [employer]. Scope In: Scope Out: Findings:      Endoscopic Finding :      A single 10 mm papule (nodule) was found in the gastric antrum.      Endosonographic Finding :      A round intramural (subepithelial) lesion was found in the antrum of the       stomach. It was encountered at 10 cm distal to the gastroesophageal       junction. The lesion was hypoechoic. Sonographically, the lesion       appeared to originate from the luminal interface/superficial mucosa       (Layer 1). The lesion also appeared to involve the following wall       layer(s): deep mucosa (Layer 2). The lesion measured 10 mm (in maximum       thickness). The lesion also measured 10 mm by 10 mm in diameter. The       outer endosonographic borders were well defined.      Pancreatic parenchymal abnormalities were noted in the entire pancreas.       These consisted of hyperechoic foci,  hypoechoic foci and lobularity.      There was no sign of significant endosonographic abnormality in the       entire main bile duct. The maximum diameter of the duct was 4 mm. An       unremarkable gallbladder, no calcifications, no stones and no biliary       sludge were identified.      There was diffuse abnormal echotexture in the left lobe of the liver.       This was characterized by a hyperechoic appearance.      There was no sign of significant endosonographic abnormality in the        ampulla. Impression:               - A single papule (nodule) found in the stomach.                           - An intramural (subepithelial) lesion was found in                            the antrum of the stomach. The lesion appeared to                            originate from within the luminal                            interface/superficial mucosa (Layer 1). Mucosal                            biopsies (previously hyperplastic) are likely                            reflective of the nature of this lesion. Other less                            likely diagnostic considerations is pancreatic rest                            and leiomyoma (originating from the muscularis                            mucosa). Overall, suspect this is hyperplastic                            polyp.                           - Pancreatic parenchymal abnormalities consisting                            of hyperechoic foci, hypoechoic foci and lobularity                            were noted in the entire pancreas. These changes                            are mild and of  doubtful significance, and not                            diagnostic of chronic pancreatitis (although                            incipient chronic pancreatitis can't entirely be                            excluded).                           - There was no sign of significant pathology in the                            entire main bile duct.                           - There was diffuse abnormal echotexture in the                            left lobe of the liver. This was characterized by a                            hyperechoic appearance.                           - There was no sign of significant pathology in the                            ampulla.                           - No specimens collected. Moderate Sedation:      N/A- Per Anesthesia Care Recommendation:           - Discharge patient to home (via wheelchair).                            - Resume previous diet today.                           - Continue present medications.                           - Return to GI clinic at appointment to be                            scheduled.                           - Return to referring physician as previously                            scheduled. Procedure Code(s):        --- Professional ---  [phone], Esophagogastroduodenoscopy, flexible,                            transoral; with endoscopic ultrasound examination,                            including the esophagus, stomach, and either the                            duodenum or a surgically altered stomach where the                            jejunum is examined distal to the anastomosis Diagnosis Code(s):        --- Professional ---                           K31.89, Other diseases of stomach and duodenum                           K86.9, Disease of pancreas, unspecified                           R10.13, Epigastric pain                           R93.2, Abnormal findings on diagnostic imaging of                            liver and biliary tract CPT copyright 2016 [employer]. All rights reserved. The codes documented in this report are preliminary and upon coder review may  be revised to meet current compliance requirements. Arta Silence, MD 10/03/2015 9:26:06 AM This report has been signed electronically. Number of Addenda: 0

## 2015-10-03 NOTE — Anesthesia Preprocedure Evaluation (Addendum)
Anesthesia Evaluation  Patient identified by MRN, date of birth, ID band Patient awake    Reviewed: Allergy & Precautions, NPO status , Patient's Chart, lab work & pertinent test results  Airway Mallampati: III  TM Distance: >3 FB Neck ROM: Full  Mouth opening: Limited Mouth Opening  Dental  (+) Teeth Intact   Pulmonary  Pulm fibrosis    + decreased breath sounds      Cardiovascular hypertension,  Rhythm:Regular Rate:Normal     Neuro/Psych    GI/Hepatic GERD  ,  Endo/Other  diabetesHypothyroidism   Renal/GU      Musculoskeletal   Abdominal (+) + obese,   Peds  Hematology   Anesthesia Other Findings   Reproductive/Obstetrics                            Anesthesia Physical Anesthesia Plan  ASA: III  Anesthesia Plan: MAC   Post-op Pain Management:    Induction: Intravenous  Airway Management Planned: Nasal Cannula and Natural Airway  Additional Equipment:   Intra-op Plan:   Post-operative Plan:   Informed Consent: I have reviewed the patients History and Physical, chart, labs and discussed the procedure including the risks, benefits and alternatives for the proposed anesthesia with the patient or authorized representative who has indicated his/her understanding and acceptance.     Plan Discussed with:   Anesthesia Plan Comments:        Anesthesia Quick Evaluation

## 2015-10-03 NOTE — H&P (Signed)
Patient interval history reviewed.  Patient examined again.  There has been no change from documented H/P dated 09/25/15 (scanned into chart from our office) except as documented above.  Assessment:  1.  Epigastric pain. 2.  Submucosal-appearing gastric nodule.  Plan:  1.  Endoscopic ultrasound with possible fine needle aspiration. 2.  Risks (bleeding, infection, bowel perforation that could require surgery, sedation-related changes in cardiopulmonary systems), benefits (identification and possible treatment of source of symptoms, exclusion of certain causes of symptoms), and alternatives (watchful waiting, radiographic imaging studies, empiric medical treatment) of upper endoscopy with ultrasound and possible fine needle aspiration biopsies (EUS +/- FNA) were explained to patient/family in detail and patient wishes to proceed.

## 2015-10-03 NOTE — Transfer of Care (Signed)
Immediate Anesthesia Transfer of Care Note  Patient: Lorraine Leonard  Procedure(s) Performed: Procedure(s): UPPER ENDOSCOPIC ULTRASOUND (EUS) RADIAL (N/A) FINE NEEDLE ASPIRATION (FNA) RADIAL (N/A)  Patient Location: PACU  Anesthesia Type:MAC  Level of Consciousness: Patient easily awoken, sedated, comfortable, cooperative, following commands, responds to stimulation.   Airway & Oxygen Therapy: Patient spontaneously breathing, ventilating well, oxygen via simple oxygen mask.  Post-op Assessment: Report given to PACU RN, vital signs reviewed and stable, moving all extremities.   Post vital signs: Reviewed and stable.  Complications: No apparent anesthesia complications

## 2015-10-03 NOTE — Anesthesia Postprocedure Evaluation (Signed)
Anesthesia Post Note  Patient: Lorraine Leonard  Procedure(s) Performed: Procedure(s) (LRB): UPPER ENDOSCOPIC ULTRASOUND (EUS) RADIAL (N/A) FINE NEEDLE ASPIRATION (FNA) RADIAL (N/A)  Patient location during evaluation: PACU Anesthesia Type: MAC Level of consciousness: awake and alert Pain management: pain level controlled Vital Signs Assessment: post-procedure vital signs reviewed and stable Respiratory status: spontaneous breathing, nonlabored ventilation, respiratory function stable and patient connected to nasal cannula oxygen Cardiovascular status: stable and blood pressure returned to baseline Anesthetic complications: no    Last Vitals:  Filed Vitals:   10/03/15 0940 10/03/15 0950  BP: 204/97 204/65  Pulse: 48 48  Temp:    Resp: 18 19    Last Pain:  Filed Vitals:   10/03/15 0952  PainSc: 4                  Osborn Pullin,JAMES TERRILL

## 2015-10-03 NOTE — Discharge Instructions (Signed)
Endoscopic Ultrasound ° °Care After °Please read the instructions outlined below and refer to this sheet in the next few weeks. These discharge instructions provide you with general information on caring for yourself after you leave the hospital. Your doctor may also give you specific instructions. While your treatment has been planned according to the most current medical practices available, unavoidable complications occasionally occur. If you have any problems or questions after discharge, please call Dr. Jonnell Hentges ([facility]) at [phone]. ° °HOME CARE INSTRUCTIONS °Activity °· You may resume your regular activity but move at a slower pace for the next 24 hours.  °· Take frequent rest periods for the next 24 hours.  °· Walking will help expel (get rid of) the air and reduce the bloated feeling in your abdomen.  °· No driving for 24 hours (because of the anesthesia (medicine) used during the test).  °· You may shower.  °· Do not sign any important legal documents or operate any machinery for 24 hours (because of the anesthesia used during the test).  °Nutrition °· Drink plenty of fluids.  °· You may resume your normal diet.  °· Begin with a light meal and progress to your normal diet.  °· Avoid alcoholic beverages for 24 hours or as instructed by your caregiver.  °Medications °You may resume your normal medications unless your caregiver tells you otherwise. °What you can expect today °· You may experience abdominal discomfort such as a feeling of fullness or "gas" pains.  °· You may experience a sore throat for 2 to 3 days. This is normal. Gargling with salt water may help this.  °·  °SEEK IMMEDIATE MEDICAL CARE IF: °· You have excessive nausea (feeling sick to your stomach) and/or vomiting.  °· You have severe abdominal pain and distention (swelling).  °· You have trouble swallowing.  °· You have a temperature over 100° F (37.8° C).  °· You have rectal bleeding or vomiting of blood.  °Document  Released: 11/20/2003 Document Revised: 12/18/2010 Document Reviewed: 06/02/2007 °ExitCare® Patient Information ©2012 [employer]. °

## 2015-10-04 ENCOUNTER — Encounter (HOSPITAL_COMMUNITY): Payer: Self-pay | Admitting: Gastroenterology

## 2015-10-08 ENCOUNTER — Encounter (INDEPENDENT_AMBULATORY_CARE_PROVIDER_SITE_OTHER): Payer: Medicare Other | Admitting: Ophthalmology

## 2015-10-08 DIAGNOSIS — H35033 Hypertensive retinopathy, bilateral: Secondary | ICD-10-CM

## 2015-10-08 DIAGNOSIS — E113213 Type 2 diabetes mellitus with mild nonproliferative diabetic retinopathy with macular edema, bilateral: Secondary | ICD-10-CM | POA: Diagnosis not present

## 2015-10-08 DIAGNOSIS — E11311 Type 2 diabetes mellitus with unspecified diabetic retinopathy with macular edema: Secondary | ICD-10-CM

## 2015-10-08 DIAGNOSIS — H43813 Vitreous degeneration, bilateral: Secondary | ICD-10-CM

## 2015-10-08 DIAGNOSIS — L501 Idiopathic urticaria: Secondary | ICD-10-CM | POA: Diagnosis not present

## 2015-10-08 DIAGNOSIS — I10 Essential (primary) hypertension: Secondary | ICD-10-CM

## 2015-10-08 DIAGNOSIS — H353231 Exudative age-related macular degeneration, bilateral, with active choroidal neovascularization: Secondary | ICD-10-CM

## 2015-10-15 ENCOUNTER — Telehealth: Payer: Self-pay | Admitting: Family

## 2015-10-15 ENCOUNTER — Other Ambulatory Visit: Payer: Self-pay | Admitting: Family Medicine

## 2015-10-15 NOTE — Telephone Encounter (Signed)
Last filled 08/26/15, last seen 07/17/15. Route to pool to call in at [facility]

## 2015-10-15 NOTE — Telephone Encounter (Signed)
appt scheduled Pt notified 

## 2015-10-16 ENCOUNTER — Ambulatory Visit (INDEPENDENT_AMBULATORY_CARE_PROVIDER_SITE_OTHER): Payer: Medicare Other | Admitting: Family

## 2015-10-16 ENCOUNTER — Telehealth: Payer: Self-pay | Admitting: Family

## 2015-10-16 ENCOUNTER — Encounter: Payer: Self-pay | Admitting: Family

## 2015-10-16 VITALS — BP 132/58 | HR 51 | Temp 97.1°F | Ht 67.0 in | Wt 238.4 lb

## 2015-10-16 DIAGNOSIS — R1013 Epigastric pain: Secondary | ICD-10-CM

## 2015-10-16 DIAGNOSIS — R110 Nausea: Secondary | ICD-10-CM | POA: Diagnosis not present

## 2015-10-16 LAB — CMP14+EGFR
ALT: 22 IU/L (ref 0–32)
AST: 25 IU/L (ref 0–40)
Albumin/Globulin Ratio: 1.3 (ref 1.2–2.2)
Albumin: 4.1 g/dL (ref 3.5–4.8)
Alkaline Phosphatase: 101 IU/L (ref 39–117)
BUN/Creatinine Ratio: 12 (ref 12–28)
BUN: 14 mg/dL (ref 8–27)
Bilirubin Total: 0.4 mg/dL (ref 0.0–1.2)
CO2: 26 mmol/L (ref 18–29)
Calcium: 10 mg/dL (ref 8.7–10.3)
Chloride: 98 mmol/L (ref 96–106)
Creatinine, Ser: 1.16 mg/dL — ABNORMAL HIGH (ref 0.57–1.00)
GFR calc Af Amer: 53 mL/min/{1.73_m2} — ABNORMAL LOW (ref >59)
GFR calc non Af Amer: 46 mL/min/{1.73_m2} — ABNORMAL LOW (ref >59)
Globulin, Total: 3.1 g/dL (ref 1.5–4.5)
Glucose: 124 mg/dL — ABNORMAL HIGH (ref 65–99)
Potassium: 4.9 mmol/L (ref 3.5–5.2)
Sodium: 142 mmol/L (ref 134–144)
Total Protein: 7.2 g/dL (ref 6.0–8.5)

## 2015-10-16 LAB — CBC WITH DIFFERENTIAL/PLATELET
Basophils Absolute: 0 10*3/uL (ref 0.0–0.2)
Basos: 0 %
EOS (ABSOLUTE): 0.3 10*3/uL (ref 0.0–0.4)
Eos: 2 %
Hematocrit: 42.5 % (ref 34.0–46.6)
Hemoglobin: 13.8 g/dL (ref 11.1–15.9)
Immature Grans (Abs): 0 10*3/uL (ref 0.0–0.1)
Immature Granulocytes: 0 %
Lymphocytes Absolute: 3 10*3/uL (ref 0.7–3.1)
Lymphs: 27 %
MCH: 29.1 pg (ref 26.6–33.0)
MCHC: 32.5 g/dL (ref 31.5–35.7)
MCV: 90 fL (ref 79–97)
Monocytes Absolute: 0.9 10*3/uL (ref 0.1–0.9)
Monocytes: 8 %
Neutrophils Absolute: 7 10*3/uL (ref 1.4–7.0)
Neutrophils: 63 %
Platelets: 305 10*3/uL (ref 150–379)
RBC: 4.74 x10E6/uL (ref 3.77–5.28)
RDW: 14.8 % (ref 12.3–15.4)
WBC: 11.3 10*3/uL — ABNORMAL HIGH (ref 3.4–10.8)

## 2015-10-16 MED ORDER — ONDANSETRON HCL 8 MG PO TABS: ORAL_TABLET | ORAL | Status: DC

## 2015-10-16 MED ORDER — DEXLANSOPRAZOLE 60 MG PO CPDR: 60.0000 mg | DELAYED_RELEASE_CAPSULE | Freq: Every day | ORAL | Status: DC

## 2015-10-16 MED ORDER — TRAMADOL HCL 50 MG PO TABS: 50.0000 mg | ORAL_TABLET | Freq: Four times a day (QID) | ORAL | Status: DC | PRN

## 2015-10-16 NOTE — Patient Instructions (Addendum)
Abdominal Pain, Adult Many things can cause abdominal pain. Usually, abdominal pain is not caused by a disease and will improve without treatment. It can often be observed and treated at home. Your health care provider will do a physical exam and possibly order blood tests and X-rays to help determine the seriousness of your pain. However, in many cases, more time must pass before a clear cause of the pain can be found. Before that point, your health care provider may not know if you need more testing or further treatment. HOME CARE INSTRUCTIONS Monitor your abdominal pain for any changes. The following actions may help to alleviate any discomfort you are experiencing:  Only take over-the-counter or prescription medicines as directed by your health care provider.  Do not take laxatives unless directed to do so by your health care provider.  Try a clear liquid diet (broth, tea, or water) as directed by your health care provider. Slowly move to a bland diet as tolerated. SEEK MEDICAL CARE IF:  You have unexplained abdominal pain.  You have abdominal pain associated with nausea or diarrhea.  You have pain when you urinate or have a bowel movement.  You experience abdominal pain that wakes you in the night.  You have abdominal pain that is worsened or improved by eating food.  You have abdominal pain that is worsened with eating fatty foods.  You have a fever. SEEK IMMEDIATE MEDICAL CARE IF:  Your pain does not go away within 2 hours.  You keep throwing up (vomiting).  Your pain is felt only in portions of the abdomen, such as the right side or the left lower portion of the abdomen.  You pass bloody or black tarry stools. MAKE SURE YOU:  Understand these instructions.  Will watch your condition.  Will get help right away if you are not doing well or get worse.   This information is not intended to replace advice given to you by your health care provider. Make sure you discuss  any questions you have with your health care provider.   Document Released: 01/15/2005 Document Revised: 12/27/2014 Document Reviewed: 12/15/2012 Elsevier Interactive Patient Education 2016 [place] for Peptic Ulcer Disease When you have peptic ulcer disease, the foods you eat and your eating habits are very important. Choosing the right foods can help ease the discomfort of peptic ulcer disease. WHAT GENERAL GUIDELINES DO I NEED TO FOLLOW?  Choose fruits, vegetables, whole grains, and low-fat meat, fish, and poultry.   Keep a food diary to identify foods that cause symptoms.  Avoid foods that cause irritation or pain. These may be different for different people.  Eat frequent small meals instead of three large meals each day. The pain may be worse when your stomach is empty.  Avoid eating close to bedtime. WHAT FOODS ARE NOT RECOMMENDED? The following are some foods and drinks that may worsen your symptoms:  Black, white, and red pepper.  Hot sauce.  Chili peppers.  Chili powder.  Chocolate and cocoa.   Alcohol.  Tea, coffee, and cola (regular and decaffeinated). The items listed above may not be a complete list of foods and beverages to avoid. Contact your dietitian for more information.   This information is not intended to replace advice given to you by your health care provider. Make sure you discuss any questions you have with your health care provider.   Document Released: 06/30/2011 Document Revised: 04/12/2013 Document Reviewed: 02/09/2013 Elsevier Interactive Patient Education 2016 Elsevier  Inc.

## 2015-10-16 NOTE — Telephone Encounter (Signed)
Pt aware.

## 2015-10-16 NOTE — Progress Notes (Signed)
Subjective:    Patient ID: [name], female    DOB: [birth year], 75 y.o.   MRN: [id number]  PT presents to the office today for epigastric pain that started two days ago. PT states she had a Upper EUS  On 10/03/15 and found she had a single papule in her stomach that was suspect to be hyperplastic polyp.  Abdominal Pain This is a new problem. The current episode started in the past 7 days (Two days ago). The onset quality is sudden. The problem occurs constantly. The problem has been unchanged. The pain is located in the epigastric region. The pain is at a severity of 4/10. The pain is moderate. The quality of the pain is dull. The abdominal pain does not radiate. Associated symptoms include belching, constipation, nausea and vomiting. Pertinent negatives include no diarrhea, dysuria or hematuria. The pain is aggravated by certain positions. The pain is relieved by sitting up ("when my stomach is full"). She has tried proton pump inhibitors for the symptoms. The treatment provided moderate relief. Prior diagnostic workup includes upper endoscopy.      Review of Systems  Gastrointestinal: Positive for nausea, vomiting, abdominal pain and constipation. Negative for diarrhea.  Genitourinary: Negative for dysuria and hematuria.  All other systems reviewed and are negative.      Objective:   Physical Exam  Constitutional: She is oriented to person, place, and time. She appears well-developed and well-nourished. No distress.  Cardiovascular: Normal rate, regular rhythm, normal heart sounds and intact distal pulses.   No murmur heard. Pulmonary/Chest: Effort normal and breath sounds normal. No respiratory distress. She has no wheezes.  Abdominal: Soft. Bowel sounds are normal. She exhibits no distension. There is tenderness (mild generalized abd pain).  Musculoskeletal: Normal range of motion. She exhibits no edema or tenderness.  Neurological: She is alert and oriented to person, place, and  time.  Skin: Skin is warm and dry.  Psychiatric: She has a normal mood and affect. Her behavior is normal. Judgment and thought content normal.  Vitals reviewed.    BP 132/58 mmHg  Pulse 51  Temp(Src) 97.1 F (36.2 C) (Oral)  Ht 5' 7" (1.702 m)  Wt 238 lb 6.4 oz (108.138 kg)  BMI 37.33 kg/m2      Assessment & Plan:  1. Epigastric pain - CMP14+EGFR - CBC with Differential/Platelet - dexlansoprazole (DEXILANT) 60 MG capsule; Take 1 capsule (60 mg total) by mouth daily.  Dispense: 30 capsule; Refill: 6  2. Nausea without vomiting - ondansetron (ZOFRAN) 8 MG tablet; TAKE 1 TABLET EVERY 8 HOURS AS NEEDED FOR NAUSEA  Dispense: 20 tablet; Refill: 0 - dexlansoprazole (DEXILANT) 60 MG capsule; Take 1 capsule (60 mg total) by mouth daily.  Dispense: 30 capsule; Refill: 6  Pt's dexilant increased to 60 mg today from 30 mg Pt's zofran reordered Pt told to follow up with GI I believe some of this pain is related to anxiety related to being told she had a "nodule in her stomach". RTO prn    Evelina Dun, FNP

## 2015-10-16 NOTE — Addendum Note (Signed)
Addended by: Zannie Cove on: 10/16/2015 09:00 AM   Modules accepted: Orders

## 2015-10-19 ENCOUNTER — Encounter: Payer: Self-pay | Admitting: Family

## 2015-10-19 ENCOUNTER — Ambulatory Visit (INDEPENDENT_AMBULATORY_CARE_PROVIDER_SITE_OTHER): Payer: Medicare Other | Admitting: Family

## 2015-10-19 VITALS — BP 135/56 | HR 58 | Temp 97.3°F | Ht 67.0 in | Wt 240.0 lb

## 2015-10-19 DIAGNOSIS — Z905 Acquired absence of kidney: Secondary | ICD-10-CM | POA: Diagnosis not present

## 2015-10-19 DIAGNOSIS — I517 Cardiomegaly: Secondary | ICD-10-CM

## 2015-10-19 DIAGNOSIS — I10 Essential (primary) hypertension: Secondary | ICD-10-CM

## 2015-10-19 DIAGNOSIS — M0579 Rheumatoid arthritis with rheumatoid factor of multiple sites without organ or systems involvement: Secondary | ICD-10-CM | POA: Diagnosis not present

## 2015-10-19 DIAGNOSIS — K5901 Slow transit constipation: Secondary | ICD-10-CM | POA: Diagnosis not present

## 2015-10-19 DIAGNOSIS — M129 Arthropathy, unspecified: Secondary | ICD-10-CM

## 2015-10-19 DIAGNOSIS — M109 Gout, unspecified: Secondary | ICD-10-CM | POA: Insufficient documentation

## 2015-10-19 DIAGNOSIS — F419 Anxiety disorder, unspecified: Secondary | ICD-10-CM | POA: Diagnosis not present

## 2015-10-19 DIAGNOSIS — E8881 Metabolic syndrome: Secondary | ICD-10-CM

## 2015-10-19 DIAGNOSIS — E039 Hypothyroidism, unspecified: Secondary | ICD-10-CM | POA: Diagnosis not present

## 2015-10-19 DIAGNOSIS — E785 Hyperlipidemia, unspecified: Secondary | ICD-10-CM | POA: Diagnosis not present

## 2015-10-19 DIAGNOSIS — E559 Vitamin D deficiency, unspecified: Secondary | ICD-10-CM | POA: Diagnosis not present

## 2015-10-19 DIAGNOSIS — M1710 Unilateral primary osteoarthritis, unspecified knee: Secondary | ICD-10-CM

## 2015-10-19 DIAGNOSIS — F329 Major depressive disorder, single episode, unspecified: Secondary | ICD-10-CM

## 2015-10-19 DIAGNOSIS — K219 Gastro-esophageal reflux disease without esophagitis: Secondary | ICD-10-CM | POA: Diagnosis not present

## 2015-10-19 DIAGNOSIS — F32A Depression, unspecified: Secondary | ICD-10-CM

## 2015-10-19 DIAGNOSIS — M1A49X0 Other secondary chronic gout, multiple sites, without tophus (tophi): Secondary | ICD-10-CM

## 2015-10-19 NOTE — Patient Instructions (Signed)
Health Maintenance, Female Adopting a healthy lifestyle and getting preventive care can go a long way to promote health and wellness. Talk with your health care provider about what schedule of regular examinations is right for you. This is a good chance for you to check in with your provider about disease prevention and staying healthy. In between checkups, there are plenty of things you can do on your own. Experts have done a lot of research about which lifestyle changes and preventive measures are most likely to keep you healthy. Ask your health care provider for more information. WEIGHT AND DIET  Eat a healthy diet  Be sure to include plenty of vegetables, fruits, low-fat dairy products, and lean protein.  Do not eat a lot of foods high in solid fats, added sugars, or salt.  Get regular exercise. This is one of the most important things you can do for your health.  Most adults should exercise for at least 150 minutes each week. The exercise should increase your heart rate and make you sweat (moderate-intensity exercise).  Most adults should also do strengthening exercises at least twice a week. This is in addition to the moderate-intensity exercise.  Maintain a healthy weight  Body mass index (BMI) is a measurement that can be used to identify possible weight problems. It estimates body fat based on height and weight. Your health care provider can help determine your BMI and help you achieve or maintain a healthy weight.  For females 20 years of age and older:   A BMI below 18.5 is considered underweight.  A BMI of 18.5 to 24.9 is normal.  A BMI of 25 to 29.9 is considered overweight.  A BMI of 30 and above is considered obese.  Watch levels of cholesterol and blood lipids  You should start having your blood tested for lipids and cholesterol at 75 years of age, then have this test every 5 years.  You may need to have your cholesterol levels checked more often if:  Your lipid  or cholesterol levels are high.  You are older than 75 years of age.  You are at high risk for heart disease.  CANCER SCREENING   Lung Cancer  Lung cancer screening is recommended for adults 55-80 years old who are at high risk for lung cancer because of a history of smoking.  A yearly low-dose CT scan of the lungs is recommended for people who:  Currently smoke.  Have quit within the past 15 years.  Have at least a 30-pack-year history of smoking. A pack year is smoking an average of one pack of cigarettes a day for 1 year.  Yearly screening should continue until it has been 15 years since you quit.  Yearly screening should stop if you develop a health problem that would prevent you from having lung cancer treatment.  Breast Cancer  Practice breast self-awareness. This means understanding how your breasts normally appear and feel.  It also means doing regular breast self-exams. Let your health care provider know about any changes, no matter how small.  If you are in your 20s or 30s, you should have a clinical breast exam (CBE) by a health care provider every 1-3 years as part of a regular health exam.  If you are 40 or older, have a CBE every year. Also consider having a breast X-ray (mammogram) every year.  If you have a family history of breast cancer, talk to your health care provider about genetic screening.  If you   are at high risk for breast cancer, talk to your health care provider about having an MRI and a mammogram every year.  Breast cancer gene (BRCA) assessment is recommended for women who have family members with BRCA-related cancers. BRCA-related cancers include:  Breast.  Ovarian.  Tubal.  Peritoneal cancers.  Results of the assessment will determine the need for genetic counseling and BRCA1 and BRCA2 testing. Cervical Cancer Your health care provider may recommend that you be screened regularly for cancer of the pelvic organs (ovaries, uterus, and  vagina). This screening involves a pelvic examination, including checking for microscopic changes to the surface of your cervix (Pap test). You may be encouraged to have this screening done every 3 years, beginning at age 21.  For women ages 30-65, health care providers may recommend pelvic exams and Pap testing every 3 years, or they may recommend the Pap and pelvic exam, combined with testing for human papilloma virus (HPV), every 5 years. Some types of HPV increase your risk of cervical cancer. Testing for HPV may also be done on women of any age with unclear Pap test results.  Other health care providers may not recommend any screening for nonpregnant women who are considered low risk for pelvic cancer and who do not have symptoms. Ask your health care provider if a screening pelvic exam is right for you.  If you have had past treatment for cervical cancer or a condition that could lead to cancer, you need Pap tests and screening for cancer for at least 20 years after your treatment. If Pap tests have been discontinued, your risk factors (such as having a new sexual partner) need to be reassessed to determine if screening should resume. Some women have medical problems that increase the chance of getting cervical cancer. In these cases, your health care provider may recommend more frequent screening and Pap tests. Colorectal Cancer  This type of cancer can be detected and often prevented.  Routine colorectal cancer screening usually begins at 75 years of age and continues through 75 years of age.  Your health care provider may recommend screening at an earlier age if you have risk factors for colon cancer.  Your health care provider may also recommend using home test kits to check for hidden blood in the stool.  A small camera at the end of a tube can be used to examine your colon directly (sigmoidoscopy or colonoscopy). This is done to check for the earliest forms of colorectal  cancer.  Routine screening usually begins at age 50.  Direct examination of the colon should be repeated every 5-10 years through 75 years of age. However, you may need to be screened more often if early forms of precancerous polyps or small growths are found. Skin Cancer  Check your skin from head to toe regularly.  Tell your health care provider about any new moles or changes in moles, especially if there is a change in a mole's shape or color.  Also tell your health care provider if you have a mole that is larger than the size of a pencil eraser.  Always use sunscreen. Apply sunscreen liberally and repeatedly throughout the day.  Protect yourself by wearing long sleeves, pants, a wide-brimmed hat, and sunglasses whenever you are outside. HEART DISEASE, DIABETES, AND HIGH BLOOD PRESSURE   High blood pressure causes heart disease and increases the risk of stroke. High blood pressure is more likely to develop in:  People who have blood pressure in the high end   of the normal range (130-139/85-89 mm Hg).  People who are overweight or obese.  People who are African American.  If you are 38-23 years of age, have your blood pressure checked every 3-5 years. If you are 61 years of age or older, have your blood pressure checked every year. You should have your blood pressure measured twice--once when you are at a hospital or clinic, and once when you are not at a hospital or clinic. Record the average of the two measurements. To check your blood pressure when you are not at a hospital or clinic, you can use:  An automated blood pressure machine at a pharmacy.  A home blood pressure monitor.  If you are between 45 years and 39 years old, ask your health care provider if you should take aspirin to prevent strokes.  Have regular diabetes screenings. This involves taking a blood sample to check your fasting blood sugar level.  If you are at a normal weight and have a low risk for diabetes,  have this test once every three years after 75 years of age.  If you are overweight and have a high risk for diabetes, consider being tested at a younger age or more often. PREVENTING INFECTION  Hepatitis B  If you have a higher risk for hepatitis B, you should be screened for this virus. You are considered at high risk for hepatitis B if:  You were born in a country where hepatitis B is common. Ask your health care provider which countries are considered high risk.  Your parents were born in a high-risk country, and you have not been immunized against hepatitis B (hepatitis B vaccine).  You have HIV or AIDS.  You use needles to inject street drugs.  You live with someone who has hepatitis B.  You have had sex with someone who has hepatitis B.  You get hemodialysis treatment.  You take certain medicines for conditions, including cancer, organ transplantation, and autoimmune conditions. Hepatitis C  Blood testing is recommended for:  Everyone born from [birth year] through 1965.  Anyone with known risk factors for hepatitis C. Sexually transmitted infections (STIs)  You should be screened for sexually transmitted infections (STIs) including gonorrhea and chlamydia if:  You are sexually active and are younger than 75 years of age.  You are older than 75 years of age and your health care provider tells you that you are at risk for this type of infection.  Your sexual activity has changed since you were last screened and you are at an increased risk for chlamydia or gonorrhea. Ask your health care provider if you are at risk.  If you do not have HIV, but are at risk, it may be recommended that you take a prescription medicine daily to prevent HIV infection. This is called pre-exposure prophylaxis (PrEP). You are considered at risk if:  You are sexually active and do not regularly use condoms or know the HIV status of your partner(s).  You take drugs by injection.  You are sexually  active with a partner who has HIV. Talk with your health care provider about whether you are at high risk of being infected with HIV. If you choose to begin PrEP, you should first be tested for HIV. You should then be tested every 3 months for as long as you are taking PrEP.  PREGNANCY   If you are premenopausal and you may become pregnant, ask your health care provider about preconception counseling.  If you may  become pregnant, take 400 to 800 micrograms (mcg) of folic acid every day.  If you want to prevent pregnancy, talk to your health care provider about birth control (contraception). OSTEOPOROSIS AND MENOPAUSE   Osteoporosis is a disease in which the bones lose minerals and strength with aging. This can result in serious bone fractures. Your risk for osteoporosis can be identified using a bone density scan.  If you are 61 years of age or older, or if you are at risk for osteoporosis and fractures, ask your health care provider if you should be screened.  Ask your health care provider whether you should take a calcium or vitamin D supplement to lower your risk for osteoporosis.  Menopause may have certain physical symptoms and risks.  Hormone replacement therapy may reduce some of these symptoms and risks. Talk to your health care provider about whether hormone replacement therapy is right for you.  HOME CARE INSTRUCTIONS   Schedule regular health, dental, and eye exams.  Stay current with your immunizations.   Do not use any tobacco products including cigarettes, chewing tobacco, or electronic cigarettes.  If you are pregnant, do not drink alcohol.  If you are breastfeeding, limit how much and how often you drink alcohol.  Limit alcohol intake to no more than 1 drink per day for nonpregnant women. One drink equals 12 ounces of beer, 5 ounces of wine, or 1 ounces of hard liquor.  Do not use street drugs.  Do not share needles.  Ask your health care provider for help if  you need support or information about quitting drugs.  Tell your health care provider if you often feel depressed.  Tell your health care provider if you have ever been abused or do not feel safe at home.   This information is not intended to replace advice given to you by your health care provider. Make sure you discuss any questions you have with your health care provider.   Document Released: 10/21/2010 Document Revised: 04/28/2014 Document Reviewed: 03/09/2013 Elsevier Interactive Patient Education [employer].

## 2015-10-19 NOTE — Progress Notes (Signed)
Subjective:    Patient ID: Lorraine Leonard, female    DOB: 06-Jun-1940, 75 y.o.   MRN: [id number]  Pt presents to the office today for chronic follow up. PT is followed by a pulmonologist once a year to monitor  pulmonary nodule.  Hypertension This is a chronic problem. The current episode started more than 1 year ago. The problem has been resolved since onset. The problem is controlled. Associated symptoms include anxiety and shortness of breath. Pertinent negatives include no headaches, palpitations or peripheral edema. Risk factors for coronary artery disease include dyslipidemia, family history, obesity, post-menopausal state and sedentary lifestyle. Past treatments include angiotensin blockers and diuretics. The current treatment provides moderate improvement. Hypertensive end-organ damage includes kidney disease ("one kidney") and a thyroid problem. There is no history of CAD/MI, CVA or heart failure.  Gastroesophageal Reflux She complains of a hoarse voice and nausea. She reports no belching, no coughing or no heartburn. This is a chronic problem. The current episode started more than 1 year ago. The problem occurs rarely. The problem has been resolved. The symptoms are aggravated by lying down. Associated symptoms include fatigue. Risk factors include obesity. She has tried a PPI for the symptoms. The treatment provided significant relief.  Anxiety Presents for follow-up visit. Onset was 1 to 6 months ago. The problem has been waxing and waning. Symptoms include depressed mood, excessive worry, nausea, nervous/anxious behavior and shortness of breath. Patient reports no insomnia, palpitations or suicidal ideas. Symptoms occur most days. The symptoms are aggravated by family issues. The quality of sleep is good.   Her past medical history is significant for anxiety/panic attacks and depression. Compliance with prior treatments has been good.  Hyperlipidemia This is a chronic problem. The current  episode started more than 1 year ago. The problem is controlled. Recent lipid tests were reviewed and are normal. Exacerbating diseases include obesity. She has no history of diabetes. Associated symptoms include shortness of breath. Current antihyperlipidemic treatment includes statins. The current treatment provides significant improvement of lipids. Risk factors for coronary artery disease include dyslipidemia, hypertension, obesity and post-menopausal.  Constipation This is a chronic problem. The current episode started more than 1 year ago. The problem has been resolved since onset. Her stool frequency is 4 to 5 times per week. Associated symptoms include nausea. Pertinent negatives include no diarrhea, fecal incontinence or vomiting. She has tried laxatives for the symptoms. The treatment provided significant relief.  Arthritis Presents for follow-up visit. The disease course has been stable. She complains of pain, stiffness and joint swelling. Affected locations include the left MCP, left wrist, right wrist and right MCP. Her pain is at a severity of 2/10. Associated symptoms include fatigue. Pertinent negatives include no diarrhea. Her past medical history is significant for osteoarthritis and rheumatoid arthritis. Her pertinent risk factors include overuse. Past treatments include rest and corticosteroids. The treatment provided significant relief. Compliance with prior treatments has been good.  Thyroid Problem Presents for follow-up visit. Symptoms include anxiety, constipation, depressed mood, fatigue and hoarse voice. Patient reports no diarrhea, leg swelling or palpitations. The symptoms have been stable. Past treatments include levothyroxine. The treatment provided significant relief. Her past medical history is significant for hyperlipidemia. There is no history of diabetes or heart failure.  Depression      The patient presents with depression.  This is a chronic problem.  The current  episode started more than 1 year ago.   The onset quality is gradual.   The problem  occurs intermittently.  The problem has been waxing and waning since onset.  Associated symptoms include fatigue.  Associated symptoms include does not have insomnia, not irritable, no headaches and no suicidal ideas.  Past medical history includes thyroid problem, anxiety and depression.   Gout PT currently taking allopurinol 100 mg daily. States she has not had a gout flare up in awhile.     Review of Systems  Constitutional: Positive for fatigue.  HENT: Positive for hoarse voice.   Eyes: Negative.   Respiratory: Positive for shortness of breath. Negative for cough.   Cardiovascular: Negative.  Negative for palpitations.  Gastrointestinal: Positive for nausea and constipation. Negative for heartburn, vomiting and diarrhea.  Endocrine: Negative.   Genitourinary: Negative.   Musculoskeletal: Positive for joint swelling, arthritis and stiffness.  Neurological: Negative.  Negative for headaches.  Hematological: Negative.   Psychiatric/Behavioral: Positive for depression. Negative for suicidal ideas. The patient is nervous/anxious. The patient does not have insomnia.   All other systems reviewed and are negative.      Objective:   Physical Exam  Constitutional: She is oriented to person, place, and time. She appears well-developed and well-nourished. She is not irritable. No distress.  HENT:  Head: Normocephalic and atraumatic.  Right Ear: External ear normal.  Left Ear: External ear normal.  Nose: Nose normal.  Mouth/Throat: Oropharynx is clear and moist.  Eyes: Pupils are equal, round, and reactive to light.  Neck: Normal range of motion. Neck supple. No thyromegaly present.  Cardiovascular: Normal rate, regular rhythm, normal heart sounds and intact distal pulses.   No murmur heard. Pulmonary/Chest: Effort normal and breath sounds normal. No respiratory distress. She has no wheezes.  Abdominal:  Soft. Bowel sounds are normal. She exhibits no distension. There is no tenderness.  Musculoskeletal: Normal range of motion. She exhibits no edema or tenderness.  Neurological: She is alert and oriented to person, place, and time. She has normal reflexes. No cranial nerve deficit.  Skin: Skin is warm and dry.  Psychiatric: She has a normal mood and affect. Her behavior is normal. Judgment and thought content normal.  Vitals reviewed.   BP 135/56 mmHg  Pulse 58  Temp(Src) 97.3 F (36.3 C) (Oral)  Ht _0  (1.702 m)  Wt 240 lb (108.863 kg)  BMI 37.58 kg/m2       Assessment & Plan:  1. Essential hypertension - CMP14+EGFR  2. Gastroesophageal reflux disease, esophagitis presence not specified - CMP14+EGFR  3. Slow transit constipation - CMP14+EGFR  4. Rheumatoid arthritis involving multiple sites with positive rheumatoid factor (HCC) - CMP14+EGFR  5. Depression - CMP14+EGFR  6. Anxiety - CMP14+EGFR  7. Vitamin D deficiency - CMP14+EGFR - VITAMIN D 25 Hydroxy (Vit-D Deficiency, Fractures)  8. Hyperlipemia - CMP14+EGFR - Lipid panel  9. Metabolic syndrome - CZY60+YTKZ  10. Single kidney - CMP14+EGFR  11. Arthritis of knee - CMP14+EGFR  12. Hypothyroidism, unspecified hypothyroidism type - CMP14+EGFR - Thyroid Panel With TSH  13. Cardiomegaly - CMP14+EGFR  14. Other secondary chronic gout of multiple sites without tophus - Uric acid   Continue all meds Labs pending Health Maintenance reviewed Diet and exercise encouraged RTO 6 months   Evelina Dun, FNP

## 2015-10-20 LAB — CMP14+EGFR
ALT: 21 IU/L (ref 0–32)
AST: 28 IU/L (ref 0–40)
Albumin/Globulin Ratio: 1.4 (ref 1.2–2.2)
Albumin: 4.2 g/dL (ref 3.5–4.8)
Alkaline Phosphatase: 94 IU/L (ref 39–117)
BUN/Creatinine Ratio: 12 (ref 12–28)
BUN: 12 mg/dL (ref 8–27)
Bilirubin Total: 0.2 mg/dL (ref 0.0–1.2)
CO2: 26 mmol/L (ref 18–29)
Calcium: 10.1 mg/dL (ref 8.7–10.3)
Chloride: 101 mmol/L (ref 96–106)
Creatinine, Ser: 1.01 mg/dL — ABNORMAL HIGH (ref 0.57–1.00)
GFR calc Af Amer: 63 mL/min/{1.73_m2} (ref >59)
GFR calc non Af Amer: 55 mL/min/{1.73_m2} — ABNORMAL LOW (ref >59)
Globulin, Total: 3 g/dL (ref 1.5–4.5)
Glucose: 144 mg/dL — ABNORMAL HIGH (ref 65–99)
Potassium: 4.9 mmol/L (ref 3.5–5.2)
Sodium: 144 mmol/L (ref 134–144)
Total Protein: 7.2 g/dL (ref 6.0–8.5)

## 2015-10-20 LAB — LIPID PANEL
Chol/HDL Ratio: 3.5 ratio units (ref 0.0–4.4)
Cholesterol, Total: 140 mg/dL (ref 100–199)
HDL: 40 mg/dL (ref >39)
LDL Calculated: 55 mg/dL (ref 0–99)
Triglycerides: 224 mg/dL — ABNORMAL HIGH (ref 0–149)
VLDL Cholesterol Cal: 45 mg/dL — ABNORMAL HIGH (ref 5–40)

## 2015-10-20 LAB — VITAMIN D 25 HYDROXY (VIT D DEFICIENCY, FRACTURES): Vit D, 25-Hydroxy: 26.8 ng/mL — ABNORMAL LOW (ref 30.0–100.0)

## 2015-10-20 LAB — THYROID PANEL WITH TSH
Free Thyroxine Index: 1.8 (ref 1.2–4.9)
T3 Uptake Ratio: 28 % (ref 24–39)
T4, Total: 6.4 ug/dL (ref 4.5–12.0)
TSH: 2.29 u[IU]/mL (ref 0.450–4.500)

## 2015-10-20 LAB — URIC ACID: Uric Acid: 5.4 mg/dL (ref 2.5–7.1)

## 2015-10-22 NOTE — Progress Notes (Signed)
Patient aware.

## 2015-10-24 DIAGNOSIS — L501 Idiopathic urticaria: Secondary | ICD-10-CM | POA: Diagnosis not present

## 2015-11-06 DIAGNOSIS — L501 Idiopathic urticaria: Secondary | ICD-10-CM | POA: Diagnosis not present

## 2015-11-09 ENCOUNTER — Ambulatory Visit (INDEPENDENT_AMBULATORY_CARE_PROVIDER_SITE_OTHER): Payer: Medicare Other | Admitting: Emergency Medicine

## 2015-11-09 ENCOUNTER — Encounter: Payer: Self-pay | Admitting: Emergency Medicine

## 2015-11-09 VITALS — BP 132/60 | HR 55 | Ht 68.0 in | Wt 242.0 lb

## 2015-11-09 DIAGNOSIS — I272 Other secondary pulmonary hypertension: Secondary | ICD-10-CM | POA: Diagnosis not present

## 2015-11-09 DIAGNOSIS — J8410 Pulmonary fibrosis, unspecified: Secondary | ICD-10-CM | POA: Diagnosis not present

## 2015-11-09 DIAGNOSIS — R0683 Snoring: Secondary | ICD-10-CM

## 2015-11-09 NOTE — Progress Notes (Signed)
Subjective:    Patient ID: Lorraine Leonard, female    DOB: 1940/12/22, 75 y.o.   MRN: [id number]  HPI 75 yo woman, never smoker, hx HTN, allergies, hyperlipidemia, ? Asthma. Seen here by Dr MR for cough, found to have some LLL interstitial changes and nodular changes on CT scan 2008. She reports more SOB than her last visit, has gained about 35 pounds. She has occasional cough and a tickle in her throat. Freq throat clearing.   ROV 02/03/12 -- hx cough, ? 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 Last time we repeated CT scan of the chest, ordered PFT. Returns feeling that her breathing is the same - no new sx. She is on zyrtec and nexium. Has been on nasal steroid in the past. She has lost 5 lbs.   ROV 08/11/12 -- hx cough, ? 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 Returns for f/u >> states that addition of astelin to zyrtec has been helpful - less PND and cough.   ROV 11/02/13 --Hx of allergies, RA, hyperlipidemia,  follows for cough, ILD. Last CT scan was 11/2011, last CXR was stable in 2014.  Not on immunosuppression for RA at this time. Breathing stable, minimal cough.   ROV 11/17/14 -- follow-up visit for history of rheumatoid arthritis with some associated mild interstitial lung disease. Chest x-ray performed on 11/02/13 showed some possible mild progression particularly in the left lung base. She returns today describing good breathing. She notes that she is deconditioned. This has affected her exercise tolerance. She is not on immune suppression for her RA. Occas cough, no wheeze.  She has constipation, sees optho for macular degeneration treatments. Uses astelin for nasal congestion.   ROV 11/09/15 -- annual follow-up for history of rheumatoid arthritis with some mild interstitial changes noted on CT scan of the chest. We repeated her high-resolution CT one year ago and I reviewed the results again today. This shows some stable mild interstitial changes  compared with 2008. There was an enlarged pulmonary artery consistent with possible pulmonary hypertension. She tells me today that she has been re-evaluated by Dr Annamaria Boots w [facility] and it is now believed that she DOES NOT have RA. She tells me that her breathing has been worse - she ascribes this to gaining 15 lbs. She snores, is not fully rested in the am. She is not interested in getting a PSG. Her activity is limited by breathing and back pain.     No evidence obstruction on spirometry.  PULMONARY FUNCTON TEST 02/03/2012  FVC 2.91  FEV1 2.1  FEV1/FVC 72.2  FVC  % Predicted 89  FEV % Predicted 90  FeF 25-75 1.45  FeF 25-75 % Predicted 2.5    Past Medical History  Diagnosis Date  . Hypertension   . Diabetes mellitus     pre-diabetic  . Allergic rhinitis   . Urticaria   . GERD (gastroesophageal reflux disease)   . 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 Diverticulosis   . Macular degeneration   . Depression   . Hypothyroidism   . Anxiety   . Anxiety   . Rheumatoid arthritis(714.0)   . Hyperlipidemia      Family History  Problem Relation Age of Onset  . Diabetes    . Hypertension    . Coronary artery disease    . Stroke    . Asthma    . Kidney disease Sister   . Lung  cancer Sister   . Lung cancer Sister   . Suicidality Son     committed suicide in 2009  . Hyperlipidemia    . Anxiety disorder    . Migraines       Social History   Social History  . Marital Status: Married    Spouse Name: N/A  . Number of Children: 4  . Years of Education: N/A   Occupational History  . retired     [employer]   Social History Main Topics  . Smoking status: Never Smoker   . Smokeless tobacco: Never Used  . Alcohol Use: No  . Drug Use: No  . Sexual Activity: Not on file   Other Topics Concern  . Not on file   Social History Narrative   Pt ONLY HAS ONE KIDNEY, she donated one of her kidneys to her sister     Allergies  Allergen Reactions  . Mobic [Meloxicam] Swelling and Other (See  Comments)    Leg swelling     Outpatient Prescriptions Prior to Visit  Medication Sig Dispense Refill  . allopurinol (ZYLOPRIM) 100 MG tablet Take 1 tablet (100 mg total) by mouth daily. 30 tablet 6  . aspirin EC 81 MG tablet Take 81 mg by mouth daily.    Marland Kitchen azelastine (ASTELIN) 0.1 % nasal spray PLACE 2 SPRAYS INTO BOTH NOSTRILS 2 (TWO) TIMES DAILY AS NEEDED. (Patient taking differently: PLACE 2 SPRAYS INTO BOTH NOSTRILS 2 (TWO) TIMES DAILY AS NEEDED FOR ALLERGIES.) 30 mL 5  . calcium citrate (CALCITRATE - DOSED IN MG ELEMENTAL CALCIUM) 950 MG tablet Take 1 tablet by mouth daily.    . Cholecalciferol (VITAMIN D) 2000 UNITS CAPS Take 2,000 Units by mouth daily.     Marland Kitchen COLCRYS 0.6 MG tablet Reported on 10/19/2015  3  . conjugated estrogens (PREMARIN) vaginal cream Place 0.5 g vaginally every Thursday. Use as directed    . cyanocobalamin 500 MCG tablet Take 500 mcg by mouth daily.     Marland Kitchen dexlansoprazole (DEXILANT) 60 MG capsule Take 1 capsule (60 mg total) by mouth daily. 30 capsule 6  . diazepam (VALIUM) 5 MG tablet TAKE 1 TABLET 2 TIMES A DAY AS NEEDED FOR ANXIETY WE CAN FILL ON 9/25 PER MD (Patient taking differently: TAKE 1 TABLET 2 TIMES A DAY AS NEEDED FOR ANXIETY.) 60 tablet 3  . EPINEPHrine 0.3 mg/0.3 mL IJ SOAJ injection Inject 0.3 mg into the muscle once as needed (For anaphylaxis.).    Marland Kitchen fexofenadine (ALLEGRA) 180 MG tablet Take 180 mg by mouth every morning.  4  . FLUOCINOLONE ACETONIDE BODY 0.01 % OIL Apply 1 application topically at bedtime as needed (Apply to affected area.).   0  . fluticasone (FLONASE) 50 MCG/ACT nasal spray Place 1 spray into both nostrils 2 (two) times daily as needed for allergies or rhinitis. (Patient taking differently: Place 1 spray into both nostrils 2 (two) times daily. ) 16 g 6  . levocetirizine (XYZAL) 5 MG tablet Take 5 mg by mouth at bedtime.     Marland Kitchen levothyroxine (SYNTHROID, LEVOTHROID) 88 MCG tablet TAKE 1 TABLET (88 MCG TOTAL) BY MOUTH DAILY BEFORE  BREAKFAST. 30 tablet 7  . metoprolol succinate (TOPROL-XL) 50 MG 24 hr tablet Take 1 tablet (50 mg total) by mouth daily. Take with or immediately following a meal. (Patient taking differently: Take 50 mg by mouth daily after supper. Take with or immediately following a meal.) 90 tablet 3  . omalizumab (XOLAIR) 150 MG injection  Inject 150 mg into the skin every 14 (fourteen) days.     . ondansetron (ZOFRAN) 8 MG tablet TAKE 1 TABLET EVERY 8 HOURS AS NEEDED FOR NAUSEA 20 tablet 0  . OVER THE COUNTER MEDICATION Place 1 drop into both eyes as needed (For dry eyes.). Systane Gel Drops    . PATADAY 0.2 % SOLN Place 1 drop into both eyes daily.    . polyethylene glycol powder (GLYCOLAX/MIRALAX) powder TAKE 17 G BY MOUTH 2 (TWO) TIMES DAILY AS NEEDED. (Patient taking differently: TAKE 17 G BY MOUTH 2 (TWO) TIMES DAILY AS NEEDED FOR CONSTIPATION.) 3162 g 0  . PROAIR HFA 108 (90 Base) MCG/ACT inhaler INHALE 2 PUFFS INTO THE LUNGS EVERY 6 (SIX) HOURS AS NEEDED FOR WHEEZING. 8.5 Inhaler 1  . simvastatin (ZOCOR) 40 MG tablet TAKE 1 TABLET (40 MG TOTAL) BY MOUTH AT BEDTIME. 30 tablet 4  . traMADol (ULTRAM) 50 MG tablet Take 1 tablet (50 mg total) by mouth every 6 (six) hours as needed. 90 tablet 0  . triamcinolone cream (KENALOG) 0.1 % Apply 1 application topically 2 (two) times daily as needed (Apply to affected area.).   3  . trimethoprim (TRIMPEX) 100 MG tablet Take 100 mg by mouth daily.    . valsartan-hydrochlorothiazide (DIOVAN-HCT) 160-12.5 MG tablet TAKE 1 TABLET BY MOUTH ONCE EVERY DAY 30 tablet 4  . VIGAMOX 0.5 % ophthalmic solution Apply 1 drop to eye as needed (Instill into affected eye four times daily for 2 days after each eye injection for macular degeneration.).   12  . vitamin C (ASCORBIC ACID) 500 MG tablet Take 500 mg by mouth daily.    . Vitamin D, Ergocalciferol, (DRISDOL) 50000 units CAPS capsule Take 1 capsule (50,000 Units total) by mouth every 7 (seven) days. (Patient taking differently:  Take 50,000 Units by mouth every Wednesday. ) 12 capsule 3  . vitamin E 400 UNIT capsule Take 400 Units by mouth daily.     No facility-administered medications prior to visit.         Objective:   Physical Exam Filed Vitals:   11/09/15 1415  BP: 132/60  Pulse: 55  Height: 5\' 8"  (1.727 m)  Weight: 242 lb (109.77 kg)  SpO2: 95%   Gen: Pleasant, obese woman, in no distress,  normal affect  ENT: No lesions,  mouth clear,  oropharynx clear, no postnasal drip  Neck: No JVD, no TMG, no carotid bruits  Lungs: No use of accessory muscles, few insp crackles L base  Cardiovascular: RRR, heart sounds normal, no murmur or gallops, no peripheral edema  Musculoskeletal: No deformities, no cyanosis or clubbing  Neuro: alert, non focal  Skin: Warm, no lesions or rashes    10/2014 --  COMPARISON: Chest CT 12/19/2011.  FINDINGS: Mediastinum/Lymph Nodes: Heart size is normal. There is no significant pericardial fluid, thickening or pericardial calcification. There is atherosclerosis of the thoracic aorta, the great vessels of the mediastinum and the coronary arteries, including calcified atherosclerotic plaque in the left anterior descending coronary arteries. Dilatation of the pulmonic trunk (4 cm in diameter), increasing compared to the prior examination. No pathologically enlarged mediastinal or hilar lymph nodes. Please note that accurate exclusion of hilar adenopathy is limited on noncontrast CT scans. Esophagus is unremarkable in appearance. No axillary lymphadenopathy.  Lungs/Pleura: High-resolution images again demonstrate some patchy areas of very mild subpleural reticulation, most evident in the periphery of the lung bases bilaterally (left greater than right). In the basal segments of the lower  lobes of the lungs there is associated bronchial wall thickening with some mild cylindrical bronchiectasis and peripheral bronchiolectasis, which appears essentially  unchanged compared to the most recent prior examination, which was also very similar to prior study 01/12/2007. No progressive findings are noted on today's examination. Inspiratory and expiratory imaging demonstrates some very mild air trapping, indicative of mild small airways disease. 6 mm subpleural nodule in the periphery of the right lower lobe (image 33 of series 6) is unchanged, likely a subpleural lymph node. A few other scattered 1-2 mm pulmonary nodules are again noted in the periphery of the lungs, similar to the prior study, favored to represent benign areas of mucoid impaction within terminal bronchioles. No larger more suspicious appearing pulmonary nodules or masses are otherwise noted. No acute consolidative airspace disease. No pleural effusions.  Upper Abdomen: Unremarkable. Specifically, previously noted hepatic steatosis is no longer evident.  Musculoskeletal/Soft Tissues: There are no aggressive appearing lytic or blastic lesions noted in the visualized portions of the skeleton.  IMPRESSION: 1. There again some areas of mild fibrosis in the lungs bilaterally (predominantly in the extreme lung bases), which are stable compared to prior examinations dating back to 2008. 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 No signs of progressive interstitial lung disease such as UIP (usual interstitial pneumonia) at this time. 2. Dilatation of the pulmonic trunk now measuring 4 cm in diameter, increased slightly compared to the prior study, suggestive of underlying pulmonary arterial hypertension. 3. Atherosclerosis, including left anterior descending coronary artery disease. Assessment for potential risk factor modification, dietary therapy or pharmacologic therapy may be warranted, if clinically indicated. 4. Additional incidental findings, as above.       Assessment & Plan:  PULMONARY FIBROSIS Stable by repeat CT scan done one year ago.  We will follow her clinically and with serial chest x-rays for now. Interestingly she's been told that she doesn't have true rheumatoid arthritis. This is good prognostic factor since I suspected that RA was a contributor to her interstitial disease.  PULMONARY NODULE No change in 6 mm subpleural nodule in the periphery of the right lower lobe  Snoring Suspect that she does have a obstructive sleep apnea. We discussed polysomnography. She was not interested. We will revisit in the future  Pulmonary hypertension ([place]) Suspected PAH based on the size of her pulmonary artery on CT scan of the chest. I will perform an echocardiogram to compare with her priors. Note that it is now suspected that she does not have rheumatoid disease which is an independent risk factor for center for hypertension. She may have sleep apnea as detailed above   Baltazar Apo, MD, PhD 11/09/2015, 2:38 PM [facility] [phone] or if no answer [phone]

## 2015-11-09 NOTE — Assessment & Plan Note (Signed)
Suspected PAH based on the size of her pulmonary artery on CT scan of the chest. I will perform an echocardiogram to compare with her priors. Note that it is now suspected that she does not have rheumatoid disease which is an independent risk factor for center for hypertension. She may have sleep apnea as detailed above

## 2015-11-09 NOTE — Patient Instructions (Signed)
Walking oximetry today We will perform an echocardiogram to compare with your prior We discussed possibly doing a sleep study. We will not order this at this time but we may decide to revisit in the future. Follow with Dr Lamonte Sakai in 3 months or sooner if you have any problems.

## 2015-11-09 NOTE — Assessment & Plan Note (Signed)
No change in 6 mm subpleural nodule in the periphery of the right lower lobe

## 2015-11-09 NOTE — Assessment & Plan Note (Signed)
Suspect that she does have a obstructive sleep apnea. We discussed polysomnography. She was not interested. We will revisit in the future

## 2015-11-09 NOTE — Assessment & Plan Note (Signed)
Stable by repeat CT scan done one year ago. We will follow her clinically and with serial chest x-rays for now. Interestingly she's been told that she doesn't have true rheumatoid arthritis. This is good prognostic factor since I suspected that RA was a contributor to her interstitial disease.

## 2015-11-16 ENCOUNTER — Telehealth: Payer: Self-pay

## 2015-11-20 ENCOUNTER — Other Ambulatory Visit: Payer: Self-pay | Admitting: Nurse Practitioner

## 2015-11-20 DIAGNOSIS — L501 Idiopathic urticaria: Secondary | ICD-10-CM | POA: Diagnosis not present

## 2015-11-20 NOTE — Telephone Encounter (Signed)
done

## 2015-11-23 ENCOUNTER — Encounter (INDEPENDENT_AMBULATORY_CARE_PROVIDER_SITE_OTHER): Payer: Self-pay

## 2015-11-23 ENCOUNTER — Ambulatory Visit (HOSPITAL_COMMUNITY): Payer: Medicare Other | Attending: Cardiology

## 2015-11-23 ENCOUNTER — Other Ambulatory Visit: Payer: Self-pay

## 2015-11-23 DIAGNOSIS — I340 Nonrheumatic mitral (valve) insufficiency: Secondary | ICD-10-CM | POA: Diagnosis not present

## 2015-11-23 DIAGNOSIS — K3189 Other diseases of stomach and duodenum: Secondary | ICD-10-CM | POA: Diagnosis not present

## 2015-11-23 DIAGNOSIS — I272 Other secondary pulmonary hypertension: Secondary | ICD-10-CM | POA: Diagnosis not present

## 2015-11-23 DIAGNOSIS — F419 Anxiety disorder, unspecified: Secondary | ICD-10-CM | POA: Diagnosis not present

## 2015-11-23 DIAGNOSIS — R1013 Epigastric pain: Secondary | ICD-10-CM | POA: Diagnosis not present

## 2015-11-23 DIAGNOSIS — E785 Hyperlipidemia, unspecified: Secondary | ICD-10-CM | POA: Insufficient documentation

## 2015-11-23 DIAGNOSIS — I119 Hypertensive heart disease without heart failure: Secondary | ICD-10-CM | POA: Diagnosis not present

## 2015-11-23 DIAGNOSIS — E039 Hypothyroidism, unspecified: Secondary | ICD-10-CM | POA: Insufficient documentation

## 2015-11-23 DIAGNOSIS — K219 Gastro-esophageal reflux disease without esophagitis: Secondary | ICD-10-CM | POA: Diagnosis not present

## 2015-12-02 ENCOUNTER — Other Ambulatory Visit: Payer: Self-pay | Admitting: Family Medicine

## 2015-12-03 NOTE — Telephone Encounter (Signed)
Refill called to [facility] 

## 2015-12-03 NOTE — Telephone Encounter (Signed)
Last seen 06/302017. Last filled 10/16/2015

## 2015-12-04 DIAGNOSIS — L501 Idiopathic urticaria: Secondary | ICD-10-CM | POA: Diagnosis not present

## 2015-12-18 DIAGNOSIS — L501 Idiopathic urticaria: Secondary | ICD-10-CM | POA: Diagnosis not present

## 2015-12-19 ENCOUNTER — Encounter (INDEPENDENT_AMBULATORY_CARE_PROVIDER_SITE_OTHER): Payer: Medicare Other | Admitting: Ophthalmology

## 2015-12-19 DIAGNOSIS — E11311 Type 2 diabetes mellitus with unspecified diabetic retinopathy with macular edema: Secondary | ICD-10-CM | POA: Diagnosis not present

## 2015-12-19 DIAGNOSIS — E113213 Type 2 diabetes mellitus with mild nonproliferative diabetic retinopathy with macular edema, bilateral: Secondary | ICD-10-CM | POA: Diagnosis not present

## 2015-12-19 DIAGNOSIS — H353231 Exudative age-related macular degeneration, bilateral, with active choroidal neovascularization: Secondary | ICD-10-CM | POA: Diagnosis not present

## 2015-12-19 DIAGNOSIS — H43813 Vitreous degeneration, bilateral: Secondary | ICD-10-CM | POA: Diagnosis not present

## 2015-12-19 DIAGNOSIS — H35033 Hypertensive retinopathy, bilateral: Secondary | ICD-10-CM | POA: Diagnosis not present

## 2015-12-19 DIAGNOSIS — I10 Essential (primary) hypertension: Secondary | ICD-10-CM

## 2015-12-21 ENCOUNTER — Telehealth: Payer: Self-pay | Admitting: Emergency Medicine

## 2015-12-21 NOTE — Telephone Encounter (Signed)
Spoke with patient-she is aware of results. Nothing more needed at this time.

## 2015-12-21 NOTE — Telephone Encounter (Signed)
Compared to the prior study, there has been no significant   interval change.

## 2015-12-21 NOTE — Telephone Encounter (Signed)
Last ov with RB on 11/09/15  Next ov with RB on 02/18/16 Instructions  Walking oximetry today We will perform an echocardiogram to compare with your prior We discussed possibly doing a sleep study. We will not order this at this time but we may decide to revisit in the future. Follow with Dr Lamonte Sakai in 3 months or sooner if you have any problems.     Called spoke with pt. She is requesting the results from her echo done on 11/23/15. I explained to her that RB is out of the office and that I would send the message to MW in his absence. She voiced understanding and had no further questions.   MW please advise

## 2015-12-23 ENCOUNTER — Other Ambulatory Visit: Payer: Self-pay | Admitting: Family

## 2015-12-23 DIAGNOSIS — R110 Nausea: Secondary | ICD-10-CM

## 2015-12-26 ENCOUNTER — Telehealth: Payer: Self-pay | Admitting: Family

## 2015-12-28 ENCOUNTER — Telehealth: Payer: Self-pay | Admitting: Family

## 2015-12-28 NOTE — Telephone Encounter (Signed)
Spoke with pt regarding bilateral edema lower legs Has been going on x approx 3 mths since starting Allopurinol pt reports appt scheduled for eval

## 2015-12-31 DIAGNOSIS — J301 Allergic rhinitis due to pollen: Secondary | ICD-10-CM | POA: Diagnosis not present

## 2015-12-31 DIAGNOSIS — J4520 Mild intermittent asthma, uncomplicated: Secondary | ICD-10-CM | POA: Diagnosis not present

## 2015-12-31 DIAGNOSIS — H1045 Other chronic allergic conjunctivitis: Secondary | ICD-10-CM | POA: Diagnosis not present

## 2015-12-31 DIAGNOSIS — L501 Idiopathic urticaria: Secondary | ICD-10-CM | POA: Diagnosis not present

## 2016-01-01 DIAGNOSIS — Z23 Encounter for immunization: Secondary | ICD-10-CM | POA: Diagnosis not present

## 2016-01-03 ENCOUNTER — Ambulatory Visit: Payer: Medicare Other | Admitting: Family

## 2016-01-08 DIAGNOSIS — L501 Idiopathic urticaria: Secondary | ICD-10-CM | POA: Diagnosis not present

## 2016-01-22 DIAGNOSIS — L501 Idiopathic urticaria: Secondary | ICD-10-CM | POA: Diagnosis not present

## 2016-01-24 ENCOUNTER — Other Ambulatory Visit: Payer: Self-pay | Admitting: Family

## 2016-01-31 ENCOUNTER — Other Ambulatory Visit: Payer: Self-pay | Admitting: Pediatrics

## 2016-02-05 DIAGNOSIS — L501 Idiopathic urticaria: Secondary | ICD-10-CM | POA: Diagnosis not present

## 2016-02-10 ENCOUNTER — Other Ambulatory Visit: Payer: Self-pay | Admitting: Family

## 2016-02-10 DIAGNOSIS — R110 Nausea: Secondary | ICD-10-CM

## 2016-02-18 ENCOUNTER — Ambulatory Visit: Payer: Medicare Other | Admitting: Emergency Medicine

## 2016-02-18 ENCOUNTER — Other Ambulatory Visit: Payer: Self-pay | Admitting: Family

## 2016-02-19 DIAGNOSIS — L501 Idiopathic urticaria: Secondary | ICD-10-CM | POA: Diagnosis not present

## 2016-02-24 ENCOUNTER — Other Ambulatory Visit: Payer: Self-pay | Admitting: Family Medicine

## 2016-03-01 ENCOUNTER — Other Ambulatory Visit: Payer: Self-pay | Admitting: Family

## 2016-03-01 DIAGNOSIS — R110 Nausea: Secondary | ICD-10-CM

## 2016-03-04 DIAGNOSIS — L501 Idiopathic urticaria: Secondary | ICD-10-CM | POA: Diagnosis not present

## 2016-03-05 ENCOUNTER — Other Ambulatory Visit: Payer: Self-pay | Admitting: Family

## 2016-03-07 ENCOUNTER — Other Ambulatory Visit: Payer: Self-pay | Admitting: Family

## 2016-03-07 DIAGNOSIS — I10 Essential (primary) hypertension: Secondary | ICD-10-CM

## 2016-03-10 ENCOUNTER — Ambulatory Visit (INDEPENDENT_AMBULATORY_CARE_PROVIDER_SITE_OTHER): Payer: Medicare Other

## 2016-03-10 ENCOUNTER — Ambulatory Visit (INDEPENDENT_AMBULATORY_CARE_PROVIDER_SITE_OTHER): Payer: Medicare Other | Admitting: Nurse Practitioner

## 2016-03-10 ENCOUNTER — Encounter: Payer: Self-pay | Admitting: Nurse Practitioner

## 2016-03-10 VITALS — BP 118/62 | HR 46 | Temp 97.2°F | Ht 68.0 in | Wt 245.0 lb

## 2016-03-10 DIAGNOSIS — S92411A Displaced fracture of proximal phalanx of right great toe, initial encounter for closed fracture: Secondary | ICD-10-CM

## 2016-03-10 DIAGNOSIS — M79674 Pain in right toe(s): Secondary | ICD-10-CM

## 2016-03-10 DIAGNOSIS — S92421A Displaced fracture of distal phalanx of right great toe, initial encounter for closed fracture: Secondary | ICD-10-CM | POA: Diagnosis not present

## 2016-03-10 NOTE — Patient Instructions (Signed)
Toe Fracture A toe fracture is a break in one of the toe bones (phalanges). Follow these instructions at home: If you have a cast:   Do not stick anything inside the cast to scratch your skin.  Check the skin around the cast every day. Tell your doctor about any concerns. Do not put lotion on the skin underneath the cast. You may put lotion on dry skin around the edges of the cast.  Do not put pressure on any part of the cast until it is fully hardened. This may take many hours.  Keep the cast clean and dry. Bathing   Do not take baths, swim, or use a hot tub until your doctor says that you can. Ask your doctor if you can take showers. You may only be allowed to take sponge baths for bathing.  If your doctor says that bathing and showering are okay, cover the cast or bandage (dressing) with a watertight plastic bag to protect it from water. Do not let the cast or bandage get wet. Managing pain, stiffness, and swelling   If you do not have a cast, put ice on the injured area if told by your doctor:  Put ice in a plastic bag.  Place a towel between your skin and the bag.  Leave the ice on for 20 minutes, 2-3 times per day.  Move your toes often to avoid stiffness and to lessen swelling.  Raise (elevate) the injured area above the level of your heart while you are sitting or lying down. Driving   Do not drive or use heavy machinery while taking pain medicine.  Do not drive while wearing a cast on a foot that you use for driving. Activity   Return to your normal activities as told by your doctor. Ask your doctor what activities are safe for you.  Perform exercises daily as told by your doctor or therapist. Safety   Do not use your leg to support your body weight until your doctor says that you can. Use crutches or other tools to help you move around as told by your doctor. General instructions   If your toe was taped to a toe that is next to it (buddy taping), follow your  doctor's instructions for changing the gauze and tape. Change it more often:  If the gauze and tape get wet. If this happens, dry the space between the toes.  If the gauze and tape are too tight and they cause your toe to become pale or to lose feeling (numb).  Wear a protective shoe as told by your doctor. If you were not given one, wear sturdy shoes that support your foot. Your shoes should not pinch your toes. Your shoes should not fit tightly against your toes.  Do not use any tobacco products, including cigarettes, chewing tobacco, or e-cigarettes. Tobacco can delay bone healing. If you need help quitting, ask your doctor.  Take medicines only as told by your doctor.  Keep all follow-up visits as told by your doctor. This is important. Contact a doctor if:  You have a fever.  Your pain medicine is not helping.  Your toe feels cold.  You lose feeling (have numbness) in your toe.  You still have pain after one week of rest and treatment.  You still have pain after your doctor has said that you can start walking again.  You have pain or tingling in your foot, and it is not going away.  You have loss of   feeling in your foot, and it is not going away. Get help right away if:  You have severe pain.  You have redness or swelling (inflammation) in your toe, and it is getting worse.  You have pain or loss of feeling in your toe, and it is getting worse.  Your toe is blue. This information is not intended to replace advice given to you by your health care provider. Make sure you discuss any questions you have with your health care provider. Document Released: 09/24/2007 Document Revised: 12/10/2015 Document Reviewed: 02/01/2014 Elsevier Interactive Patient Education  2017 [employer]  

## 2016-03-10 NOTE — Addendum Note (Signed)
Addended by: Chevis Pretty on: 03/10/2016 04:48 PM   Modules accepted: Orders

## 2016-03-10 NOTE — Progress Notes (Signed)
   Subjective:    Patient ID: Lorraine Leonard, female    DOB: 11/18/1940, 75 y.o.   MRN: [id number]  HPI Patient comes in today c/o right big toe pain- started about 2 months. Said she got toe tangled in bedspread and it pulled it a little. Has continued to swell. Painful to walk on but if she takes a tramadol it is easier to walk on. Standing up on it increases swelling. * she does have a history of gout. Said sh etook colcrys earlier but did not like the way it made her feel.   Review of Systems  Constitutional: Negative.   HENT: Negative.   Respiratory: Negative.   Cardiovascular: Negative.   Genitourinary: Negative.   Neurological: Negative.   Psychiatric/Behavioral: Negative.   All other systems reviewed and are negative.      Objective:   Physical Exam  Constitutional: She is oriented to person, place, and time. She appears well-developed and well-nourished. No distress.  Cardiovascular: Normal rate, regular rhythm and normal heart sounds.   Pulmonary/Chest: Effort normal and breath sounds normal.  Musculoskeletal:  Right first toe mild erythema with peeling on inner aspect.- patient says that she cannot feel toe- has not been able  To feel for years.  Neurological: She is alert and oriented to person, place, and time.  Skin: Skin is warm.  Psychiatric: She has a normal mood and affect. Her behavior is normal. Judgment and thought content normal.   BP 118/62   Pulse (!) 46   Temp 97.2 F (36.2 C) (Oral)   Ht 5\' 8"  (1.727 m)   Wt 245 lb (111.1 kg)   BMI 37.25 kg/m   Right first toe xray- mildly displaced fracture of proximal phalalnx and mildly displace fracture of right distal phalanx.      Assessment & Plan:  1. Great toe pain, right - DG Toe Great Right; Future - Arthritis Panel  2. Closed displaced fracture of proximal phalanx of right great toe, initial encounter 3. Closed displaced fracture of distal phalanx of right great toe, initial encounter Buddy tape  toe to 2nd toe Ice Rest  Orders Placed This Encounter  Procedures  . DG Toe Great Right    Standing Status:   Future    Number of Occurrences:   1    Standing Expiration Date:   05/10/2017    Order Specific Question:   Reason for Exam (SYMPTOM  OR DIAGNOSIS REQUIRED)    Answer:   right great toe swelling    Order Specific Question:   Preferred imaging location?    Answer:   Internal  . Arthritis Panel   Mary-Margaret Hassell Done, FNP

## 2016-03-11 ENCOUNTER — Other Ambulatory Visit: Payer: Self-pay

## 2016-03-11 DIAGNOSIS — E7800 Pure hypercholesterolemia, unspecified: Secondary | ICD-10-CM

## 2016-03-11 DIAGNOSIS — E875 Hyperkalemia: Secondary | ICD-10-CM

## 2016-03-11 DIAGNOSIS — E8881 Metabolic syndrome: Secondary | ICD-10-CM

## 2016-03-11 LAB — CMP14+EGFR
ALT: 32 IU/L (ref 0–32)
AST: 50 IU/L — ABNORMAL HIGH (ref 0–40)
Albumin/Globulin Ratio: 1.6 (ref 1.2–2.2)
Albumin: 4.5 g/dL (ref 3.5–4.8)
Alkaline Phosphatase: 103 IU/L (ref 39–117)
BUN/Creatinine Ratio: 11 — ABNORMAL LOW (ref 12–28)
BUN: 12 mg/dL (ref 8–27)
Bilirubin Total: 0.3 mg/dL (ref 0.0–1.2)
CO2: 26 mmol/L (ref 18–29)
Calcium: 10.2 mg/dL (ref 8.7–10.3)
Chloride: 100 mmol/L (ref 96–106)
Creatinine, Ser: 1.08 mg/dL — ABNORMAL HIGH (ref 0.57–1.00)
GFR calc Af Amer: 58 mL/min/{1.73_m2} — ABNORMAL LOW (ref >59)
GFR calc non Af Amer: 50 mL/min/{1.73_m2} — ABNORMAL LOW (ref >59)
Globulin, Total: 2.9 g/dL (ref 1.5–4.5)
Glucose: 103 mg/dL — ABNORMAL HIGH (ref 65–99)
Potassium: 5.3 mmol/L — ABNORMAL HIGH (ref 3.5–5.2)
Sodium: 144 mmol/L (ref 134–144)
Total Protein: 7.4 g/dL (ref 6.0–8.5)

## 2016-03-11 LAB — ARTHRITIS PANEL
Basophils Absolute: 0.1 10*3/uL (ref 0.0–0.2)
Basos: 1 %
EOS (ABSOLUTE): 0.3 10*3/uL (ref 0.0–0.4)
Eos: 3 %
Hematocrit: 42.8 % (ref 34.0–46.6)
Hemoglobin: 13.8 g/dL (ref 11.1–15.9)
Immature Grans (Abs): 0 10*3/uL (ref 0.0–0.1)
Immature Granulocytes: 0 %
Lymphocytes Absolute: 3.5 10*3/uL — ABNORMAL HIGH (ref 0.7–3.1)
Lymphs: 31 %
MCH: 29.2 pg (ref 26.6–33.0)
MCHC: 32.2 g/dL (ref 31.5–35.7)
MCV: 91 fL (ref 79–97)
Monocytes Absolute: 1 10*3/uL — ABNORMAL HIGH (ref 0.1–0.9)
Monocytes: 9 %
Neutrophils Absolute: 6.1 10*3/uL (ref 1.4–7.0)
Neutrophils: 56 %
Platelets: 312 10*3/uL (ref 150–379)
RBC: 4.73 x10E6/uL (ref 3.77–5.28)
RDW: 14.3 % (ref 12.3–15.4)
Rhuematoid fact SerPl-aCnc: 20.3 IU/mL — ABNORMAL HIGH (ref 0.0–13.9)
Sed Rate: 7 mm/hr (ref 0–40)
Uric Acid: 5.4 mg/dL (ref 2.5–7.1)
WBC: 11 10*3/uL — ABNORMAL HIGH (ref 3.4–10.8)

## 2016-03-12 ENCOUNTER — Other Ambulatory Visit: Payer: Medicare Other

## 2016-03-12 DIAGNOSIS — E7800 Pure hypercholesterolemia, unspecified: Secondary | ICD-10-CM | POA: Diagnosis not present

## 2016-03-12 DIAGNOSIS — E875 Hyperkalemia: Secondary | ICD-10-CM | POA: Diagnosis not present

## 2016-03-12 DIAGNOSIS — E8881 Metabolic syndrome: Secondary | ICD-10-CM

## 2016-03-12 DIAGNOSIS — R7309 Other abnormal glucose: Secondary | ICD-10-CM | POA: Diagnosis not present

## 2016-03-12 LAB — BAYER DCA HB A1C WAIVED: HB A1C (BAYER DCA - WAIVED): 6.3 % (ref <7.0)

## 2016-03-13 LAB — CMP14+EGFR
ALT: 28 IU/L (ref 0–32)
AST: 38 IU/L (ref 0–40)
Albumin/Globulin Ratio: 1.4 (ref 1.2–2.2)
Albumin: 4 g/dL (ref 3.5–4.8)
Alkaline Phosphatase: 98 IU/L (ref 39–117)
BUN/Creatinine Ratio: 16 (ref 12–28)
BUN: 16 mg/dL (ref 8–27)
Bilirubin Total: 0.2 mg/dL (ref 0.0–1.2)
CO2: 25 mmol/L (ref 18–29)
Calcium: 9.6 mg/dL (ref 8.7–10.3)
Chloride: 101 mmol/L (ref 96–106)
Creatinine, Ser: 1.01 mg/dL — ABNORMAL HIGH (ref 0.57–1.00)
GFR calc Af Amer: 63 mL/min/{1.73_m2} (ref >59)
GFR calc non Af Amer: 55 mL/min/{1.73_m2} — ABNORMAL LOW (ref >59)
Globulin, Total: 2.8 g/dL (ref 1.5–4.5)
Glucose: 152 mg/dL — ABNORMAL HIGH (ref 65–99)
Potassium: 4.7 mmol/L (ref 3.5–5.2)
Sodium: 142 mmol/L (ref 134–144)
Total Protein: 6.8 g/dL (ref 6.0–8.5)

## 2016-03-14 ENCOUNTER — Telehealth: Payer: Self-pay | Admitting: Family

## 2016-03-14 NOTE — Telephone Encounter (Signed)
Patient aware of results.

## 2016-03-18 DIAGNOSIS — L501 Idiopathic urticaria: Secondary | ICD-10-CM | POA: Diagnosis not present

## 2016-03-23 ENCOUNTER — Other Ambulatory Visit: Payer: Self-pay | Admitting: Family

## 2016-03-26 ENCOUNTER — Encounter (INDEPENDENT_AMBULATORY_CARE_PROVIDER_SITE_OTHER): Payer: Medicare Other | Admitting: Ophthalmology

## 2016-03-26 DIAGNOSIS — E113293 Type 2 diabetes mellitus with mild nonproliferative diabetic retinopathy without macular edema, bilateral: Secondary | ICD-10-CM | POA: Diagnosis not present

## 2016-03-26 DIAGNOSIS — H35033 Hypertensive retinopathy, bilateral: Secondary | ICD-10-CM | POA: Diagnosis not present

## 2016-03-26 DIAGNOSIS — E11319 Type 2 diabetes mellitus with unspecified diabetic retinopathy without macular edema: Secondary | ICD-10-CM

## 2016-03-26 DIAGNOSIS — I10 Essential (primary) hypertension: Secondary | ICD-10-CM

## 2016-03-26 DIAGNOSIS — H43813 Vitreous degeneration, bilateral: Secondary | ICD-10-CM | POA: Diagnosis not present

## 2016-03-26 DIAGNOSIS — H353231 Exudative age-related macular degeneration, bilateral, with active choroidal neovascularization: Secondary | ICD-10-CM | POA: Diagnosis not present

## 2016-03-26 LAB — HM DIABETES EYE EXAM

## 2016-04-08 DIAGNOSIS — L501 Idiopathic urticaria: Secondary | ICD-10-CM | POA: Diagnosis not present

## 2016-04-12 ENCOUNTER — Other Ambulatory Visit: Payer: Self-pay | Admitting: Family

## 2016-04-18 ENCOUNTER — Ambulatory Visit (INDEPENDENT_AMBULATORY_CARE_PROVIDER_SITE_OTHER): Payer: Medicare Other | Admitting: Family

## 2016-04-18 ENCOUNTER — Telehealth: Payer: Self-pay | Admitting: *Deleted

## 2016-04-18 ENCOUNTER — Encounter: Payer: Self-pay | Admitting: Family

## 2016-04-18 VITALS — BP 168/66 | HR 46 | Temp 97.5°F | Ht 68.0 in | Wt 245.0 lb

## 2016-04-18 DIAGNOSIS — E8881 Metabolic syndrome: Secondary | ICD-10-CM

## 2016-04-18 DIAGNOSIS — K219 Gastro-esophageal reflux disease without esophagitis: Secondary | ICD-10-CM | POA: Diagnosis not present

## 2016-04-18 DIAGNOSIS — I10 Essential (primary) hypertension: Secondary | ICD-10-CM | POA: Diagnosis not present

## 2016-04-18 DIAGNOSIS — E1165 Type 2 diabetes mellitus with hyperglycemia: Secondary | ICD-10-CM

## 2016-04-18 DIAGNOSIS — E559 Vitamin D deficiency, unspecified: Secondary | ICD-10-CM | POA: Diagnosis not present

## 2016-04-18 DIAGNOSIS — F331 Major depressive disorder, recurrent, moderate: Secondary | ICD-10-CM | POA: Diagnosis not present

## 2016-04-18 DIAGNOSIS — K5901 Slow transit constipation: Secondary | ICD-10-CM | POA: Diagnosis not present

## 2016-04-18 DIAGNOSIS — E7800 Pure hypercholesterolemia, unspecified: Secondary | ICD-10-CM

## 2016-04-18 DIAGNOSIS — E039 Hypothyroidism, unspecified: Secondary | ICD-10-CM

## 2016-04-18 DIAGNOSIS — M1710 Unilateral primary osteoarthritis, unspecified knee: Secondary | ICD-10-CM | POA: Diagnosis not present

## 2016-04-18 DIAGNOSIS — F419 Anxiety disorder, unspecified: Secondary | ICD-10-CM | POA: Diagnosis not present

## 2016-04-18 DIAGNOSIS — Z905 Acquired absence of kidney: Secondary | ICD-10-CM

## 2016-04-18 DIAGNOSIS — M1A49X0 Other secondary chronic gout, multiple sites, without tophus (tophi): Secondary | ICD-10-CM

## 2016-04-18 DIAGNOSIS — I517 Cardiomegaly: Secondary | ICD-10-CM | POA: Diagnosis not present

## 2016-04-18 DIAGNOSIS — J301 Allergic rhinitis due to pollen: Secondary | ICD-10-CM | POA: Diagnosis not present

## 2016-04-18 DIAGNOSIS — E669 Obesity, unspecified: Secondary | ICD-10-CM | POA: Insufficient documentation

## 2016-04-18 LAB — BAYER DCA HB A1C WAIVED: HB A1C (BAYER DCA - WAIVED): 6.4 % (ref <7.0)

## 2016-04-18 NOTE — Patient Instructions (Signed)
Diabetes Mellitus and Food It is important for you to manage your blood sugar (glucose) level. Your blood glucose level can be greatly affected by what you eat. Eating healthier foods in the appropriate amounts throughout the day at about the same time each day will help you control your blood glucose level. It can also help slow or prevent worsening of your diabetes mellitus. Healthy eating may even help you improve the level of your blood pressure and reach or maintain a healthy weight. General recommendations for healthful eating and cooking habits include:  Eating meals and snacks regularly. Avoid going long periods of time without eating to lose weight.  Eating a diet that consists mainly of plant-based foods, such as fruits, vegetables, nuts, legumes, and whole grains.  Using low-heat cooking methods, such as baking, instead of high-heat cooking methods, such as deep frying.  Work with your dietitian to make sure you understand how to use the Nutrition Facts information on food labels. How can food affect me? Carbohydrates Carbohydrates affect your blood glucose level more than any other type of food. Your dietitian will help you determine how many carbohydrates to eat at each meal and teach you how to count carbohydrates. Counting carbohydrates is important to keep your blood glucose at a healthy level, especially if you are using insulin or taking certain medicines for diabetes mellitus. Alcohol Alcohol can cause sudden decreases in blood glucose (hypoglycemia), especially if you use insulin or take certain medicines for diabetes mellitus. Hypoglycemia can be a life-threatening condition. Symptoms of hypoglycemia (sleepiness, dizziness, and disorientation) are similar to symptoms of having too much alcohol. If your health care provider has given you approval to drink alcohol, do so in moderation and use the following guidelines:  Women should not have more than one drink per day, and men  should not have more than two drinks per day. One drink is equal to: ? 12 oz of beer. ? 5 oz of wine. ? 1 oz of hard liquor.  Do not drink on an empty stomach.  Keep yourself hydrated. Have water, diet soda, or unsweetened iced tea.  Regular soda, juice, and other mixers might contain a lot of carbohydrates and should be counted.  What foods are not recommended? As you make food choices, it is important to remember that all foods are not the same. Some foods have fewer nutrients per serving than other foods, even though they might have the same number of calories or carbohydrates. It is difficult to get your body what it needs when you eat foods with fewer nutrients. Examples of foods that you should avoid that are high in calories and carbohydrates but low in nutrients include:  Trans fats (most processed foods list trans fats on the Nutrition Facts label).  Regular soda.  Juice.  Candy.  Sweets, such as cake, pie, doughnuts, and cookies.  Fried foods.  What foods can I eat? Eat nutrient-rich foods, which will nourish your body and keep you healthy. The food you should eat also will depend on several factors, including:  The calories you need.  The medicines you take.  Your weight.  Your blood glucose level.  Your blood pressure level.  Your cholesterol level.  You should eat a variety of foods, including:  Protein. ? Lean cuts of meat. ? Proteins low in saturated fats, such as fish, egg whites, and beans. Avoid processed meats.  Fruits and vegetables. ? Fruits and vegetables that may help control blood glucose levels, such as apples,   mangoes, and yams.  Dairy products. ? Choose fat-free or low-fat dairy products, such as milk, yogurt, and cheese.  Grains, bread, pasta, and rice. ? Choose whole grain products, such as multigrain bread, whole oats, and brown rice. These foods may help control blood pressure.  Fats. ? Foods containing healthful fats, such as  nuts, avocado, olive oil, canola oil, and fish.  Does everyone with diabetes mellitus have the same meal plan? Because every person with diabetes mellitus is different, there is not one meal plan that works for everyone. It is very important that you meet with a dietitian who will help you create a meal plan that is just right for you. This information is not intended to replace advice given to you by your health care provider. Make sure you discuss any questions you have with your health care provider. Document Released: 01/02/2005 Document Revised: 09/13/2015 Document Reviewed: 03/04/2013 Elsevier Interactive Patient Education  2017 [employer] Inc.  

## 2016-04-18 NOTE — Telephone Encounter (Signed)
Refill for tramadol for quantity of three months given over phone per C. Hawks.

## 2016-04-18 NOTE — Progress Notes (Signed)
Subjective:    Patient ID: Lorraine Leonard, female    DOB: May 11, 1940, 75 y.o.   MRN: [id number]  Pt presents to the office today for chronic follow up. PT is followed by a pulmonologist once a year to monitor  pulmonary nodule.  Hypertension  This is a chronic problem. The current episode started more than 1 year ago. The problem has been waxing and waning since onset. The problem is controlled. Associated symptoms include anxiety and shortness of breath. Pertinent negatives include no headaches, palpitations or peripheral edema. Risk factors for coronary artery disease include dyslipidemia, family history, obesity, post-menopausal state and sedentary lifestyle. Past treatments include angiotensin blockers and diuretics. The current treatment provides moderate improvement. Hypertensive end-organ damage includes kidney disease ("one kidney") and a thyroid problem. There is no history of CAD/MI, CVA or heart failure.  Hyperlipidemia  This is a chronic problem. The current episode started more than 1 year ago. The problem is controlled. Recent lipid tests were reviewed and are normal. Exacerbating diseases include obesity. She has no history of diabetes. Associated symptoms include shortness of breath. Current antihyperlipidemic treatment includes statins. The current treatment provides significant improvement of lipids. Risk factors for coronary artery disease include dyslipidemia, hypertension, obesity and post-menopausal.  Gastroesophageal Reflux  She complains of a hoarse voice and nausea. She reports no belching, no coughing or no heartburn. This is a chronic problem. The current episode started more than 1 year ago. The problem occurs rarely. The problem has been resolved. The symptoms are aggravated by lying down. Associated symptoms include fatigue. Risk factors include obesity. She has tried a PPI for the symptoms. The treatment provided significant relief.  Anxiety  Presents for follow-up visit.  Onset was 1 to 6 months ago. The problem has been waxing and waning. Symptoms include depressed mood, excessive worry, nausea, nervous/anxious behavior and shortness of breath. Patient reports no insomnia, palpitations or suicidal ideas. Symptoms occur most days. The symptoms are aggravated by family issues. The quality of sleep is good.   Her past medical history is significant for anxiety/panic attacks and depression. Compliance with prior treatments has been good.  Constipation  This is a chronic problem. The current episode started more than 1 year ago. The problem has been resolved since onset. Her stool frequency is 2 to 3 times per week. Associated symptoms include nausea. Pertinent negatives include no diarrhea, fecal incontinence or vomiting. She has tried laxatives for the symptoms. The treatment provided moderate relief.  Arthritis  Presents for follow-up visit. The disease course has been stable. She complains of pain, stiffness and joint swelling. Affected locations include the left MCP, left wrist, right wrist, right MCP, right shoulder and left shoulder. Her pain is at a severity of 10/10. Associated symptoms include fatigue. Pertinent negatives include no diarrhea. Her past medical history is significant for osteoarthritis and rheumatoid arthritis. Her pertinent risk factors include overuse. Past treatments include rest and corticosteroids. The treatment provided significant relief. Compliance with prior treatments has been good.  Thyroid Problem  Presents for follow-up visit. Symptoms include anxiety, constipation, depressed mood, fatigue and hoarse voice. Patient reports no diarrhea, leg swelling, palpitations or visual change. The symptoms have been stable. Past treatments include levothyroxine. The treatment provided significant relief. Her past medical history is significant for hyperlipidemia. There is no history of diabetes or heart failure.  Depression       The patient presents  with depression.  This is a chronic problem.  The current episode started  more than 1 year ago.   The onset quality is gradual.   The problem occurs intermittently.  The problem has been waxing and waning since onset.  Associated symptoms include fatigue.  Associated symptoms include does not have insomnia, not irritable, no headaches and no suicidal ideas.  Past medical history includes thyroid problem, anxiety and depression.   Diabetes  She presents for her follow-up diabetic visit. She has type 2 diabetes mellitus. Hypoglycemia symptoms include nervousness/anxiousness. Pertinent negatives for hypoglycemia include no headaches. Associated symptoms include fatigue. Pertinent negatives for diabetes include no visual change. Pertinent negatives for diabetic complications include no CVA. Risk factors for coronary artery disease include dyslipidemia, diabetes mellitus, obesity, hypertension, post-menopausal and sedentary lifestyle. Current diabetic treatment includes diet. She is compliant with treatment some of the time. Her weight is stable. She is following a generally unhealthy diet. An ACE inhibitor/angiotensin II receptor blocker is being taken. Eye exam is not current.  Gout PT currently taking allopurinol 100 mg daily. States she has intermittent bilaterally foot pain that she is unsure if it is gout or not.  Metabolic Syndrome  PT does not exercise regularly  Or is not on a low fat or carb diet.   Review of Systems  Constitutional: Positive for fatigue.  HENT: Positive for hoarse voice.   Eyes: Negative.   Respiratory: Positive for shortness of breath. Negative for cough.   Cardiovascular: Negative.  Negative for palpitations.  Gastrointestinal: Positive for constipation and nausea. Negative for diarrhea, heartburn and vomiting.  Endocrine: Negative.   Genitourinary: Negative.   Musculoskeletal: Positive for arthritis, joint swelling and stiffness.  Neurological: Negative.  Negative for  headaches.  Hematological: Negative.   Psychiatric/Behavioral: Positive for depression. Negative for suicidal ideas. The patient is nervous/anxious. The patient does not have insomnia.   All other systems reviewed and are negative.      Objective:   Physical Exam  Constitutional: She is oriented to person, place, and time. She appears well-developed and well-nourished. She is not irritable. No distress.  HENT:  Head: Normocephalic and atraumatic.  Right Ear: External ear normal.  Left Ear: External ear normal.  Nose: Nose normal.  Mouth/Throat: Oropharynx is clear and moist.  Eyes: Pupils are equal, round, and reactive to light.  Neck: Normal range of motion. Neck supple. No thyromegaly present.  Cardiovascular: Normal rate, regular rhythm, normal heart sounds and intact distal pulses.   No murmur heard. Pulmonary/Chest: Effort normal and breath sounds normal. No respiratory distress. She has no wheezes.  Abdominal: Soft. Bowel sounds are normal. She exhibits no distension. There is no tenderness.  Musculoskeletal: Normal range of motion. She exhibits no edema or tenderness.  Neurological: She is alert and oriented to person, place, and time. She has normal reflexes. No cranial nerve deficit.  Skin: Skin is warm and dry.  Psychiatric: She has a normal mood and affect. Her behavior is normal. Judgment and thought content normal.  Vitals reviewed.   BP (!) 166/75   Pulse (!) 43   Temp 97.5 F (36.4 C) (Oral)   Ht _0  (1.727 m)   Wt 245 lb (111.1 kg)   BMI 37.25 kg/m        Assessment & Plan:  1. Essential hypertension - CMP14+EGFR  2. Cardiomegaly - CMP14+EGFR  3. Chronic allergic rhinitis due to pollen, unspecified seasonality - CMP14+EGFR  4. Slow transit constipation - CMP14+EGFR  5. Gastroesophageal reflux disease, esophagitis presence not specified - CMP14+EGFR  6. Hypothyroidism, unspecified type  -  CMP14+EGFR - Thyroid Panel With TSH  7. Arthritis  of knee - CMP14+EGFR  8. Single kidney - CMP14+EGFR  9. Anxiety - CMP14+EGFR  10. Moderate episode of recurrent major depressive disorder (HCC)  - CMP14+EGFR  11. Pure hypercholesterolemia - CMP14+EGFR - Lipid panel  12. Vitamin D deficiency - CMP14+EGFR  13. Metabolic syndrome  - NHR14+CQPE  14. Other secondary chronic gout of multiple sites without tophus - CMP14+EGFR - Uric acid  15. Morbid obesity (HCC)  - CMP14+EGFR  16. Type 2 diabetes mellitus with hyperglycemia, without long-term current use of insulin (HCC) - CMP14+EGFR - Bayer DCA Hb A1c Waived - Microalbumin / creatinine urine ratio - Ambulatory referral to Ophthalmology   Continue all meds Labs pending Health Maintenance reviewed Diet and exercise encouraged RTO 3 months   Evelina Dun, FNP

## 2016-04-19 LAB — CMP14+EGFR
ALT: 24 IU/L (ref 0–32)
AST: 38 IU/L (ref 0–40)
Albumin/Globulin Ratio: 1.3 (ref 1.2–2.2)
Albumin: 4.1 g/dL (ref 3.5–4.8)
Alkaline Phosphatase: 99 IU/L (ref 39–117)
BUN/Creatinine Ratio: 13 (ref 12–28)
BUN: 13 mg/dL (ref 8–27)
Bilirubin Total: 0.3 mg/dL (ref 0.0–1.2)
CO2: 29 mmol/L (ref 18–29)
Calcium: 9.7 mg/dL (ref 8.7–10.3)
Chloride: 100 mmol/L (ref 96–106)
Creatinine, Ser: 1 mg/dL (ref 0.57–1.00)
GFR calc Af Amer: 64 mL/min/{1.73_m2} (ref >59)
GFR calc non Af Amer: 55 mL/min/{1.73_m2} — ABNORMAL LOW (ref >59)
Globulin, Total: 3.1 g/dL (ref 1.5–4.5)
Glucose: 96 mg/dL (ref 65–99)
Potassium: 4.9 mmol/L (ref 3.5–5.2)
Sodium: 143 mmol/L (ref 134–144)
Total Protein: 7.2 g/dL (ref 6.0–8.5)

## 2016-04-19 LAB — LIPID PANEL
Chol/HDL Ratio: 3.4 ratio units (ref 0.0–4.4)
Cholesterol, Total: 136 mg/dL (ref 100–199)
HDL: 40 mg/dL (ref >39)
LDL Calculated: 56 mg/dL (ref 0–99)
Triglycerides: 199 mg/dL — ABNORMAL HIGH (ref 0–149)
VLDL Cholesterol Cal: 40 mg/dL (ref 5–40)

## 2016-04-19 LAB — THYROID PANEL WITH TSH
Free Thyroxine Index: 1.6 (ref 1.2–4.9)
T3 Uptake Ratio: 28 % (ref 24–39)
T4, Total: 5.6 ug/dL (ref 4.5–12.0)
TSH: 5.14 u[IU]/mL — ABNORMAL HIGH (ref 0.450–4.500)

## 2016-04-19 LAB — URIC ACID: Uric Acid: 6.8 mg/dL (ref 2.5–7.1)

## 2016-04-19 LAB — MICROALBUMIN / CREATININE URINE RATIO
Creatinine, Urine: 18.2 mg/dL
Microalb/Creat Ratio: 85.7 mg/g creat — ABNORMAL HIGH (ref 0.0–30.0)
Microalbumin, Urine: 15.6 ug/mL

## 2016-04-22 DIAGNOSIS — L501 Idiopathic urticaria: Secondary | ICD-10-CM | POA: Diagnosis not present

## 2016-04-23 ENCOUNTER — Other Ambulatory Visit: Payer: Self-pay | Admitting: Family

## 2016-04-23 MED ORDER — LEVOTHYROXINE SODIUM 100 MCG PO TABS: 100.0000 ug | ORAL_TABLET | Freq: Every day | ORAL | 1 refills | Status: DC

## 2016-04-28 ENCOUNTER — Telehealth: Payer: Self-pay | Admitting: Family

## 2016-04-28 NOTE — Telephone Encounter (Signed)
Pt called - labs and notes were discussed and she verbalized understanding of the DM II diagnosis

## 2016-04-29 ENCOUNTER — Telehealth: Payer: Self-pay | Admitting: Family

## 2016-04-30 NOTE — Telephone Encounter (Signed)
Noted  

## 2016-05-06 DIAGNOSIS — L501 Idiopathic urticaria: Secondary | ICD-10-CM | POA: Diagnosis not present

## 2016-05-13 ENCOUNTER — Other Ambulatory Visit: Payer: Self-pay | Admitting: Family

## 2016-05-13 DIAGNOSIS — R110 Nausea: Secondary | ICD-10-CM

## 2016-05-13 DIAGNOSIS — R1013 Epigastric pain: Secondary | ICD-10-CM

## 2016-05-19 ENCOUNTER — Other Ambulatory Visit: Payer: Self-pay | Admitting: Family

## 2016-05-19 ENCOUNTER — Encounter: Payer: Self-pay | Admitting: Family Medicine

## 2016-05-19 ENCOUNTER — Ambulatory Visit (INDEPENDENT_AMBULATORY_CARE_PROVIDER_SITE_OTHER): Payer: Medicare Other | Admitting: Family Medicine

## 2016-05-19 VITALS — BP 157/57 | HR 52 | Temp 97.3°F | Ht 68.0 in | Wt 246.2 lb

## 2016-05-19 DIAGNOSIS — J189 Pneumonia, unspecified organism: Secondary | ICD-10-CM | POA: Diagnosis not present

## 2016-05-19 DIAGNOSIS — R05 Cough: Secondary | ICD-10-CM

## 2016-05-19 DIAGNOSIS — R0981 Nasal congestion: Secondary | ICD-10-CM | POA: Diagnosis not present

## 2016-05-19 DIAGNOSIS — R059 Cough, unspecified: Secondary | ICD-10-CM

## 2016-05-19 DIAGNOSIS — R52 Pain, unspecified: Secondary | ICD-10-CM | POA: Diagnosis not present

## 2016-05-19 LAB — VERITOR FLU A/B WAIVED
Influenza A: NEGATIVE
Influenza B: NEGATIVE

## 2016-05-19 MED ORDER — DOXYCYCLINE HYCLATE 100 MG PO TABS: 100.0000 mg | ORAL_TABLET | Freq: Two times a day (BID) | ORAL | 0 refills | Status: DC

## 2016-05-19 MED ORDER — OSELTAMIVIR PHOSPHATE 75 MG PO CAPS: 75.0000 mg | ORAL_CAPSULE | Freq: Two times a day (BID) | ORAL | 0 refills | Status: DC

## 2016-05-20 ENCOUNTER — Telehealth: Payer: Self-pay | Admitting: Family

## 2016-05-20 DIAGNOSIS — L501 Idiopathic urticaria: Secondary | ICD-10-CM | POA: Diagnosis not present

## 2016-05-20 NOTE — Telephone Encounter (Signed)
Pt should be fine to continue with Xolair injections with Tamiflu and Doxycycline

## 2016-05-20 NOTE — Progress Notes (Signed)
BP (!) 157/57   Pulse (!) 52   Temp 97.3 F (36.3 C) (Oral)   Ht 5\' 8"  (1.727 m)   Wt 246 lb 3.2 oz (111.7 kg)   BMI 37.43 kg/m    Subjective:    Patient ID: Lorraine Leonard, female    DOB: 10-Jun-1940, 76 y.o.   MRN: [id number]  HPI: Lorraine Leonard is a 76 y.o. female presenting on 05/19/2016 for Cough (x 2 days); Generalized Body Aches; Nasal Congestion; and Headache   HPI Lorraine Leonard is a 76 year old female presenting with cough, body aches, nasal congestion and headache for the last 2 days.  She states she has suffered with some sinus issues that last couple weeks but her cough, headache and congestion started and worsened since Saturday.  She says her cough has been productive of white, yellow and blood-tinged "globs" of mucus.  Her body aches began yesterday and she states she had a fever in the 99 range.  She has also had a runny nose which she describes as watery and sometimes bloody, and she has had drainage going down her throat.  She states she has had some nausea, shortness breath and difficulty breathing recently as well.  She has not had any vomiting, but has had some diarrhea and constipation lately.   She tried coricidin last night which helped her sleep a bit, and ibuprofen this morning without much relief.  She also uses Flonase every morning and Astelin every night. She states she was with her grandchildren last week, one of which was treated for the flu on Friday.  Her flu vaccine and pneumovax are up to date.   Relevant past medical, surgical, family and social history reviewed and updated as indicated. Interim medical history since our last visit reviewed. Allergies and medications reviewed and updated.  Review of Systems  Constitutional: Positive for fever.  HENT: Positive for congestion, postnasal drip, rhinorrhea and sore throat. Negative for ear discharge and ear pain.   Eyes: Negative for pain, discharge and visual disturbance.  Respiratory: Positive for cough and  shortness of breath.   Cardiovascular: Negative for chest pain.  Gastrointestinal: Positive for constipation, diarrhea and nausea. Negative for abdominal pain and vomiting.  Neurological: Positive for headaches.  Psychiatric/Behavioral: Positive for sleep disturbance.    Per HPI unless specifically indicated above      Objective:    BP (!) 157/57   Pulse (!) 52   Temp 97.3 F (36.3 C) (Oral)   Ht 5\' 8"  (1.727 m)   Wt 246 lb 3.2 oz (111.7 kg)   BMI 37.43 kg/m   Wt Readings from Last 3 Encounters:  05/19/16 246 lb 3.2 oz (111.7 kg)  04/18/16 245 lb (111.1 kg)  03/10/16 245 lb (111.1 kg)    Physical Exam  Constitutional: She appears well-developed and well-nourished. She is cooperative. No distress.  HENT:  Right Ear: Tympanic membrane, external ear and ear canal normal. No drainage. No middle ear effusion.  Left Ear: Tympanic membrane, external ear and ear canal normal. No drainage.  No middle ear effusion.  Nose: Right sinus exhibits no maxillary sinus tenderness and no frontal sinus tenderness. Left sinus exhibits no maxillary sinus tenderness and no frontal sinus tenderness.  Mouth/Throat: Uvula is midline. Posterior oropharyngeal erythema present. No oropharyngeal exudate or posterior oropharyngeal edema.  Cardiovascular: Regular rhythm and normal heart sounds.  Bradycardia present.  Exam reveals no gallop.   No murmur heard. Pulmonary/Chest: Effort normal. She has rales in  the right lower field and the left lower field.  Lymphadenopathy:       Head (right side): No submental, no submandibular, no tonsillar, no preauricular, no posterior auricular and no occipital adenopathy present.       Head (left side): No submental, no submandibular, no tonsillar, no preauricular, no posterior auricular and no occipital adenopathy present.    She has no cervical adenopathy.    Rapid Flu Test: Negative for Influenza A & B   Assessment & Plan:   Problem List Items Addressed This  Visit    None    Visit Diagnoses    Community acquired pneumonia, unspecified laterality    -  Primary   Relevant Medications   oseltamivir (TAMIFLU) 75 MG capsule   doxycycline (VIBRA-TABS) 100 MG tablet   Other Relevant Orders   Veritor Flu A/B Waived (Completed)   Cough       Relevant Orders   Veritor Flu A/B Waived (Completed)   Nasal congestion       Relevant Orders   Veritor Flu A/B Waived (Completed)      Follow up plan: Return if symptoms worsen or fail to improve.  Counseling provided for all of the vaccine components Orders Placed This Encounter  Procedures  . Veritor Flu A/B Waived   Patient seen and discussed and no reviewed with student Lorraine Leonard.  Lorraine Pina, MD [facility] 05/21/2016, 8:30 PM

## 2016-05-20 NOTE — Telephone Encounter (Signed)
Left detailed mssg on VM 

## 2016-05-26 ENCOUNTER — Telehealth: Payer: Self-pay | Admitting: Family Medicine

## 2016-05-26 MED ORDER — NITROFURANTOIN MONOHYD MACRO 100 MG PO CAPS: 100.0000 mg | ORAL_CAPSULE | Freq: Two times a day (BID) | ORAL | 0 refills | Status: DC

## 2016-05-26 NOTE — Telephone Encounter (Signed)
Macrobid Prescription sent to pharmacy   

## 2016-05-26 NOTE — Telephone Encounter (Signed)
Patient is complaining with frequent urination, burning when urinates, foul odor to urine. Patient does not feel that she can come in she is still getting over pneumonia and flu.

## 2016-05-26 NOTE — Telephone Encounter (Signed)
Patient aware.

## 2016-05-28 ENCOUNTER — Other Ambulatory Visit: Payer: Self-pay | Admitting: Family

## 2016-05-28 DIAGNOSIS — R110 Nausea: Secondary | ICD-10-CM

## 2016-06-06 DIAGNOSIS — L500 Allergic urticaria: Secondary | ICD-10-CM | POA: Diagnosis not present

## 2016-06-06 DIAGNOSIS — R350 Frequency of micturition: Secondary | ICD-10-CM | POA: Diagnosis not present

## 2016-06-06 DIAGNOSIS — N3001 Acute cystitis with hematuria: Secondary | ICD-10-CM | POA: Diagnosis not present

## 2016-06-09 ENCOUNTER — Other Ambulatory Visit: Payer: Self-pay | Admitting: Family

## 2016-06-14 ENCOUNTER — Other Ambulatory Visit: Payer: Self-pay | Admitting: Family Medicine

## 2016-06-14 DIAGNOSIS — J019 Acute sinusitis, unspecified: Secondary | ICD-10-CM

## 2016-06-18 DIAGNOSIS — L501 Idiopathic urticaria: Secondary | ICD-10-CM | POA: Diagnosis not present

## 2016-06-25 ENCOUNTER — Ambulatory Visit (INDEPENDENT_AMBULATORY_CARE_PROVIDER_SITE_OTHER): Payer: Medicare Other | Admitting: Family Medicine

## 2016-06-25 ENCOUNTER — Ambulatory Visit (INDEPENDENT_AMBULATORY_CARE_PROVIDER_SITE_OTHER): Payer: Medicare Other

## 2016-06-25 VITALS — BP 135/59 | HR 58 | Temp 97.4°F | Ht 68.0 in | Wt 246.0 lb

## 2016-06-25 DIAGNOSIS — R112 Nausea with vomiting, unspecified: Secondary | ICD-10-CM

## 2016-06-25 MED ORDER — PROMETHAZINE HCL 25 MG/ML IJ SOLN
25.0000 mg | Freq: Once | INTRAMUSCULAR | Status: AC
  Administered 2016-06-25: 25 mg via INTRAMUSCULAR

## 2016-06-25 MED ORDER — PROMETHAZINE HCL 25 MG RE SUPP: 25.0000 mg | Freq: Four times a day (QID) | RECTAL | 0 refills | Status: DC | PRN

## 2016-06-25 NOTE — Progress Notes (Signed)
Subjective:  Patient ID: Lorraine Leonard, female    DOB: 26-Sep-1940  Age: 76 y.o. MRN: [id number]  CC: Nausea (Ate a bad piece of chicken Monday night)   HPI Lorraine Leonard presents for A day and a half of nausewa, vomiting & diarrhea with acholic appearing stools. Very nauseous today  but no vomiting. She did not get relief from the nausea when she took an ondansetron noted on her med list. She took that for couple of hours ago without any relief. Of note is that the symptoms seemed to start after eating some chicken 2 nights ago. The chicken was undercooked and the first bite tasted really bad. She microwaved it and the rest taste is okay. By morning she was having abdominal pain cramping moderate in severity with multiple bowel movements. The bowel movements were loose more watery first but at have become loose over the last day.  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   She has a past surgical history that includes Lumbar disc surgery; Kidney surgery; Vaginal hysterectomy; Tubal ligation; EUS (N/A, 10/03/2015); and Fine needle aspiration (N/A, 10/03/2015).   Her family history includes Kidney disease in her sister; Lung cancer in her sister and sister; Suicidality in her son.She reports that she has never smoked. She has never used smokeless tobacco. She reports that she does not drink alcohol or use drugs.  Current Outpatient Prescriptions on File Prior to Visit  Medication Sig Dispense Refill  . aspirin EC 81 MG tablet Take 81 mg by mouth daily.    Marland Kitchen azelastine (ASTELIN) 0.1 % nasal spray PLACE 2 SPRAYS INTO BOTH NOSTRILS 2 (TWO) TIMES DAILY AS NEEDED. 30 mL 4  . calcium citrate (CALCITRATE - DOSED IN MG ELEMENTAL CALCIUM) 950 MG tablet  Take 1 tablet by mouth daily.    . Cholecalciferol (VITAMIN D) 2000 UNITS CAPS Take 2,000 Units by mouth daily.     Marland Kitchen COLCRYS 0.6 MG tablet Reported on 10/19/2015  3  . conjugated estrogens (PREMARIN) vaginal cream Place 0.5 g vaginally every Thursday. Use as directed    . cyanocobalamin 500 MCG tablet Take 500 mcg by mouth daily.     Marland Kitchen DEXILANT 60 MG capsule TAKE 1 CAPSULE (60 MG TOTAL) BY MOUTH DAILY. 30 capsule 5  . diazepam (VALIUM) 5 MG tablet TAKE 1 TABLET TWICE A DAY AS NEEDED FOR ANXIETY 60 tablet 2  . EPINEPHrine 0.3 mg/0.3 mL IJ SOAJ injection Inject 0.3 mg into the muscle once as needed (For anaphylaxis.).    Marland Kitchen fexofenadine (ALLEGRA) 180 MG tablet Take 180 mg by mouth every morning.  4  . FLUOCINOLONE ACETONIDE BODY 0.01 % OIL Apply 1 application topically at bedtime as needed (Apply to affected area.).   0  . fluticasone (FLONASE) 50 MCG/ACT nasal spray PLACE 1 SPRAY INTO BOTH NOSTRILS 2 (TWO) TIMES DAILY AS NEEDED FOR ALLERGIES OR RHINITIS. 16 g 5  . levocetirizine (XYZAL) 5 MG tablet Take 5 mg by mouth at bedtime.     Marland Kitchen levothyroxine (SYNTHROID) 100 MCG tablet Take 1 tablet (100 mcg total) by mouth daily before breakfast. 90 tablet 1  . metoprolol succinate (TOPROL-XL) 50 MG 24 hr tablet TAKE 1 TABLET (50 MG TOTAL) BY MOUTH DAILY. TAKE WITH OR IMMEDIATELY FOLLOWING A MEAL. 90 tablet 3  . omalizumab (XOLAIR) 150 MG injection Inject  150 mg into the skin every 14 (fourteen) days.     . ondansetron (ZOFRAN) 8 MG tablet TAKE 1 TABLET EVERY 8 HOURS AS NEEDED FOR NAUSEA 20 tablet 0  . oseltamivir (TAMIFLU) 75 MG capsule Take 1 capsule (75 mg total) by mouth 2 (two) times daily. 10 capsule 0  . OVER THE COUNTER MEDICATION Place 1 drop into both eyes as needed (For dry eyes.). Systane Gel Drops    . PATADAY 0.2 % SOLN Place 1 drop into both eyes daily.    . polyethylene glycol powder (GLYCOLAX/MIRALAX) powder TAKE 17 G BY MOUTH 2 (TWO) TIMES DAILY AS NEEDED. 3162 g 0  . PROAIR HFA 108 (90  Base) MCG/ACT inhaler INHALE 2 PUFFS INTO THE LUNGS EVERY 6 (SIX) HOURS AS NEEDED FOR WHEEZING. 8.5 Inhaler 1  . simvastatin (ZOCOR) 40 MG tablet TAKE 1 TABLET (40 MG TOTAL) BY MOUTH AT BEDTIME. 30 tablet 3  . traMADol (ULTRAM) 50 MG tablet TAKE 1 TABLET BY MOUTH EVERY 6 HOURS AS NEEDED 90 tablet 2  . triamcinolone cream (KENALOG) 0.1 % Apply 1 application topically 2 (two) times daily as needed (Apply to affected area.).   3  . trimethoprim (TRIMPEX) 100 MG tablet Take 100 mg by mouth daily.    . valsartan-hydrochlorothiazide (DIOVAN-HCT) 160-12.5 MG tablet TAKE 1 TABLET BY MOUTH ONCE EVERY DAY 30 tablet 4  . VIGAMOX 0.5 % ophthalmic solution Apply 1 drop to eye as needed (Instill into affected eye four times daily for 2 days after each eye injection for macular degeneration.).   12  . vitamin C (ASCORBIC ACID) 500 MG tablet Take 500 mg by mouth daily.    . Vitamin D, Ergocalciferol, (DRISDOL) 50000 units CAPS capsule Take 1 capsule (50,000 Units total) by mouth every 7 (seven) days. (Patient taking differently: Take 50,000 Units by mouth every Wednesday. ) 12 capsule 3  . vitamin E 400 UNIT capsule Take 400 Units by mouth daily.     No current facility-administered medications on file prior to visit.     ROS Review of Systems  Constitutional: Negative for fever.  HENT: Negative for congestion, rhinorrhea and sore throat.   Respiratory: Negative for cough and shortness of breath.   Cardiovascular: Negative for chest pain and palpitations.  Gastrointestinal: Positive for abdominal distention, abdominal pain, diarrhea and nausea. Negative for blood in stool and vomiting.  Musculoskeletal: Negative for arthralgias and myalgias.    Objective:  BP (!) 135/59   Pulse (!) 58   Temp 97.4 F (36.3 C) (Oral)   Ht '5\' 8"'$  (1.727 m)   Wt 246 lb (111.6 kg)   BMI 37.40 kg/m   Physical Exam  Constitutional: She is oriented to person, place, and time. She appears well-developed and well-nourished.    distraught  HENT:  Head: Normocephalic and atraumatic.  Eyes: EOM are normal. Pupils are equal, round, and reactive to light.  Neck: Normal range of motion. Neck supple.  Cardiovascular: Normal rate and regular rhythm.   No murmur heard. Pulmonary/Chest: Effort normal and breath sounds normal.  Abdominal: Soft. Bowel sounds are normal. She exhibits no distension and no mass. There is tenderness (diffuse). There is no rebound and no guarding.  Neurological: She is alert and oriented to person, place, and time.  Skin: Skin is warm and dry. She is not diaphoretic.  Psychiatric: Her mood appears anxious.    Assessment & Plan:   Lorraine Leonard was seen today for nausea.  Diagnoses and all orders for this  visit:  Intractable vomiting with nausea, unspecified vomiting type -     CBC with Differential/Platelet -     CMP14+EGFR -     DG Abd 2 Views; Future -     promethazine (PHENERGAN) injection 25 mg; Inject 1 mL (25 mg total) into the muscle once.  Other orders -     promethazine (PHENERGAN) 25 MG suppository; Place 1 suppository (25 mg total) rectally every 6 (six) hours as needed for nausea or vomiting.   I have discontinued Lorraine Leonard's doxycycline and nitrofurantoin (macrocrystal-monohydrate). I am also having her start on promethazine. Additionally, I am having her maintain her trimethoprim, conjugated estrogens, PATADAY, Vitamin D, calcium citrate, vitamin E, cyanocobalamin, vitamin C, omalizumab, levocetirizine, Vitamin D (Ergocalciferol), PROAIR HFA, aspirin EC, fexofenadine, Fluocinolone Acetonide Body, triamcinolone cream, VIGAMOX, EPINEPHrine, OVER THE COUNTER MEDICATION, COLCRYS, azelastine, traMADol, polyethylene glycol powder, diazepam, metoprolol succinate, levothyroxine, DEXILANT, oseltamivir, valsartan-hydrochlorothiazide, ondansetron, simvastatin, and fluticasone. We administered promethazine.  Meds ordered this encounter  Medications  . promethazine (PHENERGAN) injection 25 mg   . promethazine (PHENERGAN) 25 MG suppository    Sig: Place 1 suppository (25 mg total) rectally every 6 (six) hours as needed for nausea or vomiting.    Dispense:  12 each    Refill:  0     Follow-up: Return if symptoms worsen or fail to improve.  Claretta Fraise, M.D.

## 2016-06-26 DIAGNOSIS — R350 Frequency of micturition: Secondary | ICD-10-CM | POA: Diagnosis not present

## 2016-06-26 DIAGNOSIS — N3020 Other chronic cystitis without hematuria: Secondary | ICD-10-CM | POA: Diagnosis not present

## 2016-06-26 LAB — CMP14+EGFR
ALT: 30 IU/L (ref 0–32)
AST: 39 IU/L (ref 0–40)
Albumin/Globulin Ratio: 1.4 (ref 1.2–2.2)
Albumin: 4.2 g/dL (ref 3.5–4.8)
Alkaline Phosphatase: 88 IU/L (ref 39–117)
BUN/Creatinine Ratio: 12 (ref 12–28)
BUN: 10 mg/dL (ref 8–27)
Bilirubin Total: 0.3 mg/dL (ref 0.0–1.2)
CO2: 26 mmol/L (ref 18–29)
Calcium: 9.5 mg/dL (ref 8.7–10.3)
Chloride: 100 mmol/L (ref 96–106)
Creatinine, Ser: 0.86 mg/dL (ref 0.57–1.00)
GFR calc Af Amer: 76 mL/min/{1.73_m2} (ref >59)
GFR calc non Af Amer: 66 mL/min/{1.73_m2} (ref >59)
Globulin, Total: 3.1 g/dL (ref 1.5–4.5)
Glucose: 96 mg/dL (ref 65–99)
Potassium: 4.4 mmol/L (ref 3.5–5.2)
Sodium: 145 mmol/L — ABNORMAL HIGH (ref 134–144)
Total Protein: 7.3 g/dL (ref 6.0–8.5)

## 2016-06-26 LAB — CBC WITH DIFFERENTIAL/PLATELET
Basophils Absolute: 0 10*3/uL (ref 0.0–0.2)
Basos: 0 %
EOS (ABSOLUTE): 0.3 10*3/uL (ref 0.0–0.4)
Eos: 3 %
Hematocrit: 43.9 % (ref 34.0–46.6)
Hemoglobin: 14.5 g/dL (ref 11.1–15.9)
Immature Grans (Abs): 0 10*3/uL (ref 0.0–0.1)
Immature Granulocytes: 0 %
Lymphocytes Absolute: 2.6 10*3/uL (ref 0.7–3.1)
Lymphs: 30 %
MCH: 29.6 pg (ref 26.6–33.0)
MCHC: 33 g/dL (ref 31.5–35.7)
MCV: 90 fL (ref 79–97)
Monocytes Absolute: 0.7 10*3/uL (ref 0.1–0.9)
Monocytes: 8 %
Neutrophils Absolute: 5 10*3/uL (ref 1.4–7.0)
Neutrophils: 59 %
Platelets: 319 10*3/uL (ref 150–379)
RBC: 4.9 x10E6/uL (ref 3.77–5.28)
RDW: 14.2 % (ref 12.3–15.4)
WBC: 8.5 10*3/uL (ref 3.4–10.8)

## 2016-06-27 ENCOUNTER — Other Ambulatory Visit: Payer: Self-pay | Admitting: Family Medicine

## 2016-06-27 MED ORDER — PROMETHAZINE HCL 25 MG PO TABS: 25.0000 mg | ORAL_TABLET | Freq: Three times a day (TID) | ORAL | 0 refills | Status: DC | PRN

## 2016-06-27 NOTE — Progress Notes (Signed)
Patient aware.

## 2016-07-04 ENCOUNTER — Other Ambulatory Visit: Payer: Self-pay | Admitting: Family

## 2016-07-04 DIAGNOSIS — R110 Nausea: Secondary | ICD-10-CM

## 2016-07-05 ENCOUNTER — Other Ambulatory Visit: Payer: Self-pay | Admitting: Family

## 2016-07-10 DIAGNOSIS — H02055 Trichiasis without entropian left lower eyelid: Secondary | ICD-10-CM | POA: Diagnosis not present

## 2016-07-10 DIAGNOSIS — H353232 Exudative age-related macular degeneration, bilateral, with inactive choroidal neovascularization: Secondary | ICD-10-CM | POA: Diagnosis not present

## 2016-07-10 DIAGNOSIS — Z961 Presence of intraocular lens: Secondary | ICD-10-CM | POA: Diagnosis not present

## 2016-07-10 DIAGNOSIS — H02054 Trichiasis without entropian left upper eyelid: Secondary | ICD-10-CM | POA: Diagnosis not present

## 2016-07-15 DIAGNOSIS — J301 Allergic rhinitis due to pollen: Secondary | ICD-10-CM | POA: Diagnosis not present

## 2016-07-15 DIAGNOSIS — L501 Idiopathic urticaria: Secondary | ICD-10-CM | POA: Diagnosis not present

## 2016-07-15 DIAGNOSIS — H1045 Other chronic allergic conjunctivitis: Secondary | ICD-10-CM | POA: Diagnosis not present

## 2016-07-15 DIAGNOSIS — J4520 Mild intermittent asthma, uncomplicated: Secondary | ICD-10-CM | POA: Diagnosis not present

## 2016-07-16 ENCOUNTER — Encounter (INDEPENDENT_AMBULATORY_CARE_PROVIDER_SITE_OTHER): Payer: Medicare Other | Admitting: Ophthalmology

## 2016-07-16 DIAGNOSIS — I10 Essential (primary) hypertension: Secondary | ICD-10-CM | POA: Diagnosis not present

## 2016-07-16 DIAGNOSIS — H35033 Hypertensive retinopathy, bilateral: Secondary | ICD-10-CM

## 2016-07-16 DIAGNOSIS — E11319 Type 2 diabetes mellitus with unspecified diabetic retinopathy without macular edema: Secondary | ICD-10-CM

## 2016-07-16 DIAGNOSIS — E113293 Type 2 diabetes mellitus with mild nonproliferative diabetic retinopathy without macular edema, bilateral: Secondary | ICD-10-CM | POA: Diagnosis not present

## 2016-07-16 DIAGNOSIS — H353231 Exudative age-related macular degeneration, bilateral, with active choroidal neovascularization: Secondary | ICD-10-CM | POA: Diagnosis not present

## 2016-07-16 DIAGNOSIS — H43813 Vitreous degeneration, bilateral: Secondary | ICD-10-CM | POA: Diagnosis not present

## 2016-07-16 LAB — HM DIABETES EYE EXAM

## 2016-07-17 ENCOUNTER — Ambulatory Visit (INDEPENDENT_AMBULATORY_CARE_PROVIDER_SITE_OTHER): Payer: Medicare Other | Admitting: Family

## 2016-07-17 ENCOUNTER — Encounter: Payer: Self-pay | Admitting: Family

## 2016-07-17 VITALS — BP 163/65 | HR 55 | Temp 97.1°F | Ht 68.0 in | Wt 243.6 lb

## 2016-07-17 DIAGNOSIS — E559 Vitamin D deficiency, unspecified: Secondary | ICD-10-CM | POA: Diagnosis not present

## 2016-07-17 DIAGNOSIS — K219 Gastro-esophageal reflux disease without esophagitis: Secondary | ICD-10-CM | POA: Diagnosis not present

## 2016-07-17 DIAGNOSIS — E1142 Type 2 diabetes mellitus with diabetic polyneuropathy: Secondary | ICD-10-CM | POA: Diagnosis not present

## 2016-07-17 DIAGNOSIS — M1710 Unilateral primary osteoarthritis, unspecified knee: Secondary | ICD-10-CM

## 2016-07-17 DIAGNOSIS — Z905 Acquired absence of kidney: Secondary | ICD-10-CM | POA: Diagnosis not present

## 2016-07-17 DIAGNOSIS — I10 Essential (primary) hypertension: Secondary | ICD-10-CM | POA: Diagnosis not present

## 2016-07-17 DIAGNOSIS — E039 Hypothyroidism, unspecified: Secondary | ICD-10-CM

## 2016-07-17 DIAGNOSIS — I517 Cardiomegaly: Secondary | ICD-10-CM | POA: Diagnosis not present

## 2016-07-17 DIAGNOSIS — F331 Major depressive disorder, recurrent, moderate: Secondary | ICD-10-CM | POA: Diagnosis not present

## 2016-07-17 DIAGNOSIS — E119 Type 2 diabetes mellitus without complications: Secondary | ICD-10-CM | POA: Insufficient documentation

## 2016-07-17 DIAGNOSIS — E8881 Metabolic syndrome: Secondary | ICD-10-CM | POA: Diagnosis not present

## 2016-07-17 DIAGNOSIS — L508 Other urticaria: Secondary | ICD-10-CM

## 2016-07-17 DIAGNOSIS — J301 Allergic rhinitis due to pollen: Secondary | ICD-10-CM

## 2016-07-17 DIAGNOSIS — F419 Anxiety disorder, unspecified: Secondary | ICD-10-CM

## 2016-07-17 DIAGNOSIS — K5901 Slow transit constipation: Secondary | ICD-10-CM | POA: Diagnosis not present

## 2016-07-17 DIAGNOSIS — E7800 Pure hypercholesterolemia, unspecified: Secondary | ICD-10-CM | POA: Diagnosis not present

## 2016-07-17 DIAGNOSIS — M1A49X0 Other secondary chronic gout, multiple sites, without tophus (tophi): Secondary | ICD-10-CM

## 2016-07-17 LAB — BAYER DCA HB A1C WAIVED: HB A1C (BAYER DCA - WAIVED): 6.5 % (ref <7.0)

## 2016-07-17 MED ORDER — ESCITALOPRAM OXALATE 5 MG PO TABS: 5.0000 mg | ORAL_TABLET | Freq: Every day | ORAL | 1 refills | Status: DC

## 2016-07-17 MED ORDER — DIAZEPAM 5 MG PO TABS: ORAL_TABLET | ORAL | 5 refills | Status: DC

## 2016-07-17 NOTE — Patient Instructions (Signed)
Stress and Stress Management Stress is a normal reaction to life events. It is what you feel when life demands more than you are used to or more than you can handle. Some stress can be useful. For example, the stress reaction can help you catch the last bus of the day, study for a test, or meet a deadline at work. But stress that occurs too often or for too long can cause problems. It can affect your emotional health and interfere with relationships and normal daily activities. Too much stress can weaken your immune system and increase your risk for physical illness. If you already have a medical problem, stress can make it worse. What are the causes? All sorts of life events may cause stress. An event that causes stress for one person may not be stressful for another person. Major life events commonly cause stress. These may be positive or negative. Examples include losing your job, moving into a new home, getting married, having a baby, or losing a loved one. Less obvious life events may also cause stress, especially if they occur day after day or in combination. Examples include working long hours, driving in traffic, caring for children, being in debt, or being in a difficult relationship. What are the signs or symptoms? Stress may cause emotional symptoms including, the following:  Anxiety. This is feeling worried, afraid, on edge, overwhelmed, or out of control.  Anger. This is feeling irritated or impatient.  Depression. This is feeling sad, down, helpless, or guilty.  Difficulty focusing, remembering, or making decisions. Stress may cause physical symptoms, including the following:  Aches and pains. These may affect your head, neck, back, stomach, or other areas of your body.  Tight muscles or clenched jaw.  Low energy or trouble sleeping. Stress may cause unhealthy behaviors, including the following:  Eating to feel better (overeating) or skipping meals.  Sleeping too little, too  much, or both.  Working too much or putting off tasks (procrastination).  Smoking, drinking alcohol, or using drugs to feel better. How is this diagnosed? Stress is diagnosed through an assessment by your health care provider. Your health care provider will ask questions about your symptoms and any stressful life events.Your health care provider will also ask about your medical history and may order blood tests or other tests. Certain medical conditions and medicine can cause physical symptoms similar to stress. Mental illness can cause emotional symptoms and unhealthy behaviors similar to stress. Your health care provider may refer you to a mental health professional for further evaluation. How is this treated? Stress management is the recommended treatment for stress.The goals of stress management are reducing stressful life events and coping with stress in healthy ways. Techniques for reducing stressful life events include the following:  Stress identification. Self-monitor for stress and identify what causes stress for you. These skills may help you to avoid some stressful events.  Time management. Set your priorities, keep a calendar of events, and learn to say "no." These tools can help you avoid making too many commitments. Techniques for coping with stress include the following:  Rethinking the problem. Try to think realistically about stressful events rather than ignoring them or overreacting. Try to find the positives in a stressful situation rather than focusing on the negatives.  Exercise. Physical exercise can release both physical and emotional tension. The key is to find a form of exercise you enjoy and do it regularly.  Relaxation techniques. These relax the body and mind. Examples include yoga,  meditation, tai chi, biofeedback, deep breathing, progressive muscle relaxation, listening to music, being out in nature, journaling, and other hobbies. Again, the key is to find one or  more that you enjoy and can do regularly.  Healthy lifestyle. Eat a balanced diet, get plenty of sleep, and do not smoke. Avoid using alcohol or drugs to relax.  Strong support network. Spend time with family, friends, or other people you enjoy being around.Express your feelings and talk things over with someone you trust. Counseling or talktherapy with a mental health professional may be helpful if you are having difficulty managing stress on your own. Medicine is typically not recommended for the treatment of stress.Talk to your health care provider if you think you need medicine for symptoms of stress. Follow these instructions at home:  Keep all follow-up visits as directed by your health care provider.  Take all medicines as directed by your health care provider. Contact a health care provider if:  Your symptoms get worse or you start having new symptoms.  You feel overwhelmed by your problems and can no longer manage them on your own. Get help right away if:  You feel like hurting yourself or someone else. This information is not intended to replace advice given to you by your health care provider. Make sure you discuss any questions you have with your health care provider. Document Released: 10/01/2000 Document Revised: 09/13/2015 Document Reviewed: 11/30/2012 Elsevier Interactive Patient Education  2017 [employer].

## 2016-07-17 NOTE — Progress Notes (Signed)
Subjective:    Patient ID: Lorraine Leonard, female    DOB: 16-Aug-1940, 76 y.o.   MRN: [id number]  Pt presents to the office today for chronic follow up. PT is followed by a pulmonologist once a year to monitor  pulmonary nodule.  Hyperlipidemia  This is a chronic problem. The current episode started more than 1 year ago. The problem is controlled. Recent lipid tests were reviewed and are normal. Exacerbating diseases include obesity. She has no history of diabetes. Associated symptoms include shortness of breath. Current antihyperlipidemic treatment includes statins. The current treatment provides significant improvement of lipids. Risk factors for coronary artery disease include dyslipidemia, hypertension, obesity and post-menopausal.  Hypertension  This is a chronic problem. The current episode started more than 1 year ago. The problem has been waxing and waning since onset. The problem is uncontrolled. Associated symptoms include anxiety and shortness of breath. Pertinent negatives include no headaches, palpitations or peripheral edema. Risk factors for coronary artery disease include dyslipidemia, family history, obesity, post-menopausal state and sedentary lifestyle. Past treatments include angiotensin blockers and diuretics. The current treatment provides moderate improvement. Hypertensive end-organ damage includes kidney disease ("one kidney"). There is no history of CAD/MI, CVA or heart failure. Identifiable causes of hypertension include a thyroid problem.  Gastroesophageal Reflux  She complains of a hoarse voice and nausea. She reports no belching, no coughing or no heartburn. This is a chronic problem. The current episode started more than 1 year ago. The problem occurs rarely. The problem has been resolved. The symptoms are aggravated by lying down. Associated symptoms include fatigue. Risk factors include obesity. She has tried a PPI for the symptoms. The treatment provided significant relief.   Anxiety  Presents for follow-up visit. Onset was 1 to 6 months ago. The problem has been waxing and waning. Symptoms include depressed mood, excessive worry, nausea, nervous/anxious behavior and shortness of breath. Patient reports no insomnia, palpitations or suicidal ideas. Symptoms occur most days. The symptoms are aggravated by family issues. The quality of sleep is good.   Her past medical history is significant for anxiety/panic attacks and depression. Compliance with prior treatments has been good.  Constipation  This is a chronic problem. The current episode started more than 1 year ago. The problem has been resolved since onset. Her stool frequency is 2 to 3 times per week. Associated symptoms include nausea. Pertinent negatives include no diarrhea, fecal incontinence or vomiting. She has tried laxatives for the symptoms. The treatment provided moderate relief.  Arthritis  Presents for follow-up visit. The disease course has been stable. She complains of pain, stiffness and joint swelling. Affected locations include the left MCP, left wrist, right wrist, right MCP, right shoulder and left shoulder. Her pain is at a severity of 6/10. Associated symptoms include fatigue. Pertinent negatives include no diarrhea. Her past medical history is significant for osteoarthritis and rheumatoid arthritis. Her pertinent risk factors include overuse. Past treatments include rest and corticosteroids. The treatment provided significant relief. Compliance with prior treatments has been good.  Thyroid Problem  Presents for follow-up visit. Symptoms include anxiety, constipation, depressed mood, fatigue and hoarse voice. Patient reports no diarrhea, leg swelling, palpitations or visual change. The symptoms have been stable. Past treatments include levothyroxine. The treatment provided significant relief. Her past medical history is significant for hyperlipidemia. There is no history of diabetes or heart failure.    Depression       The patient presents with depression.  This is a chronic problem.  The current episode started more than 1 year ago.   The onset quality is gradual.   The problem occurs intermittently.  The problem has been waxing and waning since onset.  Associated symptoms include fatigue, helplessness, hopelessness and sad.  Associated symptoms include does not have insomnia, not irritable, no headaches and no suicidal ideas.  Past medical history includes thyroid problem, anxiety and depression.   Diabetes  She presents for her follow-up diabetic visit. She has type 2 diabetes mellitus. Hypoglycemia symptoms include nervousness/anxiousness. Pertinent negatives for hypoglycemia include no headaches. Associated symptoms include fatigue and foot paresthesias. Pertinent negatives for diabetes include no visual change. Diabetic complications include peripheral neuropathy. Pertinent negatives for diabetic complications include no CVA. Risk factors for coronary artery disease include dyslipidemia, diabetes mellitus, obesity, hypertension, post-menopausal and sedentary lifestyle. Current diabetic treatment includes diet. She is compliant with treatment some of the time. Her weight is stable. She is following a generally unhealthy diet. An ACE inhibitor/angiotensin II receptor blocker is being taken. Eye exam is not current.  Gout PT currently stopped her allopurinol 100 mg. States she had a "flare up" a couple weeks ago in her right great tow that has reseolved.  Metabolic Syndrome  PT does not exercise regularly  Or is not on a low fat or carb diet.  Chronic Urticaria PT is followed by [facility] every 6 weeks. Currently taking xyzal, allegra, flonase, and Pataday OP. PT states she started with hives last week and saw her allergens specialists who gave her steroid injection that helped a "little".    Review of Systems  Constitutional: Positive for fatigue.  HENT: Positive for hoarse voice.    Eyes: Negative.   Respiratory: Positive for shortness of breath. Negative for cough.   Cardiovascular: Negative.  Negative for palpitations.  Gastrointestinal: Positive for constipation and nausea. Negative for diarrhea, heartburn and vomiting.  Endocrine: Negative.   Genitourinary: Negative.   Musculoskeletal: Positive for arthritis, joint swelling and stiffness.  Neurological: Negative.  Negative for headaches.  Hematological: Negative.   Psychiatric/Behavioral: Positive for depression. Negative for suicidal ideas. The patient is nervous/anxious. The patient does not have insomnia.   All other systems reviewed and are negative.      Objective:   Physical Exam  Constitutional: She is oriented to person, place, and time. She appears well-developed and well-nourished. She is not irritable. No distress.  HENT:  Head: Normocephalic and atraumatic.  Right Ear: External ear normal.  Left Ear: External ear normal.  Nose: Nose normal.  Mouth/Throat: Oropharynx is clear and moist.  Eyes: Pupils are equal, round, and reactive to light.  Neck: Normal range of motion. Neck supple. No thyromegaly present.  Cardiovascular: Normal rate, regular rhythm, normal heart sounds and intact distal pulses.   No murmur heard. Pulmonary/Chest: Effort normal and breath sounds normal. No respiratory distress. She has no wheezes.  Abdominal: Soft. Bowel sounds are normal. She exhibits no distension. There is no tenderness.  Musculoskeletal: Normal range of motion. She exhibits no edema or tenderness.  Neurological: She is alert and oriented to person, place, and time. She has normal reflexes. No cranial nerve deficit.  Skin: Skin is warm and dry.  Psychiatric: Her behavior is normal. Judgment and thought content normal. Her mood appears anxious.  Tearful   Vitals reviewed.   Temp 97.1 F (36.2 C) (Oral)   Ht '5\' 8"'$  (1.727 m)   Wt 243 lb 9.6 oz (110.5 kg)   BMI 37.04 kg/m  Assessment & Plan:   1. Essential hypertension - CMP14+EGFR  2. Cardiomegaly - CMP14+EGFR  3. Chronic allergic rhinitis due to pollen, unspecified seasonality - CMP14+EGFR  4. Gastroesophageal reflux disease, esophagitis presence not specified - CMP14+EGFR  5. Slow transit constipation - CMP14+EGFR  6. Hypothyroidism, unspecified type - CMP14+EGFR - Thyroid Panel With TSH  7. Arthritis of knee - CMP14+EGFR  8. Vitamin D deficiency - CMP14+EGFR  9. Morbid obesity ([place]) - CMP14+EGFR  10. Single kidney - CMP14+EGFR  11. Metabolic syndrome - SJG28+ZMOQ - Lipid panel - Bayer DCA Hb A1c Waived  12. Pure hypercholesterolemia - CMP14+EGFR - Lipid panel  13. Anxiety -Lexapro 5 mg started today Stress management discussed RTO in 6 weeks  - CMP14+EGFR - escitalopram (LEXAPRO) 5 MG tablet; Take 1 tablet (5 mg total) by mouth daily.  Dispense: 90 tablet; Refill: 1 - diazepam (VALIUM) 5 MG tablet; TAKE 1 TABLET TWICE A DAY AS NEEDED FOR ANXIETY  Dispense: 60 tablet; Refill: 5  14. Moderate episode of recurrent major depressive disorder (HCC) -Lexapro 5 mg started today Stress management discussed RTO in 6 weeks  - CMP14+EGFR - escitalopram (LEXAPRO) 5 MG tablet; Take 1 tablet (5 mg total) by mouth daily.  Dispense: 90 tablet; Refill: 1  15. Other secondary chronic gout of multiple sites without tophus - CMP14+EGFR - Uric acid  16. Chronic urticaria - CMP14+EGFR  17. Type 2 diabetes mellitus with diabetic polyneuropathy, without long-term current use of insulin ([place])   Continue all meds Labs pending Health Maintenance reviewed Diet and exercise encouraged RTO 6 weeks to recheck GAD and depression  Evelina Dun, FNP

## 2016-07-17 NOTE — Addendum Note (Signed)
Addended by: Evelina Dun A on: 07/17/2016 02:59 PM   Modules accepted: Orders

## 2016-07-18 LAB — CMP14+EGFR
ALT: 21 IU/L (ref 0–32)
AST: 34 IU/L (ref 0–40)
Albumin/Globulin Ratio: 1.2 (ref 1.2–2.2)
Albumin: 4.2 g/dL (ref 3.5–4.8)
Alkaline Phosphatase: 86 IU/L (ref 39–117)
BUN/Creatinine Ratio: 14 (ref 12–28)
BUN: 14 mg/dL (ref 8–27)
Bilirubin Total: 0.3 mg/dL (ref 0.0–1.2)
CO2: 28 mmol/L (ref 18–29)
Calcium: 10.1 mg/dL (ref 8.7–10.3)
Chloride: 100 mmol/L (ref 96–106)
Creatinine, Ser: 1.01 mg/dL — ABNORMAL HIGH (ref 0.57–1.00)
GFR calc Af Amer: 62 mL/min/{1.73_m2} (ref >59)
GFR calc non Af Amer: 54 mL/min/{1.73_m2} — ABNORMAL LOW (ref >59)
Globulin, Total: 3.4 g/dL (ref 1.5–4.5)
Glucose: 83 mg/dL (ref 65–99)
Potassium: 5.3 mmol/L — ABNORMAL HIGH (ref 3.5–5.2)
Sodium: 144 mmol/L (ref 134–144)
Total Protein: 7.6 g/dL (ref 6.0–8.5)

## 2016-07-18 LAB — THYROID PANEL WITH TSH
Free Thyroxine Index: 1.9 (ref 1.2–4.9)
T3 Uptake Ratio: 29 % (ref 24–39)
T4, Total: 6.6 ug/dL (ref 4.5–12.0)
TSH: 3.92 u[IU]/mL (ref 0.450–4.500)

## 2016-07-18 LAB — URIC ACID: Uric Acid: 7.2 mg/dL — ABNORMAL HIGH (ref 2.5–7.1)

## 2016-07-18 LAB — LIPID PANEL
Chol/HDL Ratio: 3.8 ratio units (ref 0.0–4.4)
Cholesterol, Total: 135 mg/dL (ref 100–199)
HDL: 36 mg/dL — ABNORMAL LOW (ref >39)
LDL Calculated: 69 mg/dL (ref 0–99)
Triglycerides: 148 mg/dL (ref 0–149)
VLDL Cholesterol Cal: 30 mg/dL (ref 5–40)

## 2016-07-21 ENCOUNTER — Other Ambulatory Visit: Payer: Self-pay | Admitting: Family

## 2016-07-21 MED ORDER — FEBUXOSTAT 40 MG PO TABS: 40.0000 mg | ORAL_TABLET | Freq: Every day | ORAL | 1 refills | Status: DC

## 2016-08-04 DIAGNOSIS — K219 Gastro-esophageal reflux disease without esophagitis: Secondary | ICD-10-CM | POA: Diagnosis not present

## 2016-08-04 DIAGNOSIS — K3189 Other diseases of stomach and duodenum: Secondary | ICD-10-CM | POA: Diagnosis not present

## 2016-08-04 DIAGNOSIS — R1013 Epigastric pain: Secondary | ICD-10-CM | POA: Diagnosis not present

## 2016-08-05 ENCOUNTER — Ambulatory Visit: Payer: Medicare Other

## 2016-08-06 ENCOUNTER — Encounter: Payer: Self-pay | Admitting: Family

## 2016-08-06 DIAGNOSIS — K2970 Gastritis, unspecified, without bleeding: Secondary | ICD-10-CM | POA: Diagnosis not present

## 2016-08-06 DIAGNOSIS — R12 Heartburn: Secondary | ICD-10-CM | POA: Diagnosis not present

## 2016-08-06 DIAGNOSIS — K317 Polyp of stomach and duodenum: Secondary | ICD-10-CM | POA: Diagnosis not present

## 2016-08-06 DIAGNOSIS — K2930 Chronic superficial gastritis without bleeding: Secondary | ICD-10-CM | POA: Diagnosis not present

## 2016-08-11 ENCOUNTER — Other Ambulatory Visit: Payer: Self-pay | Admitting: Family

## 2016-08-13 DIAGNOSIS — K2930 Chronic superficial gastritis without bleeding: Secondary | ICD-10-CM | POA: Diagnosis not present

## 2016-08-13 DIAGNOSIS — K2970 Gastritis, unspecified, without bleeding: Secondary | ICD-10-CM | POA: Diagnosis not present

## 2016-08-28 ENCOUNTER — Ambulatory Visit (INDEPENDENT_AMBULATORY_CARE_PROVIDER_SITE_OTHER): Payer: Medicare Other | Admitting: Family

## 2016-08-28 ENCOUNTER — Encounter: Payer: Self-pay | Admitting: Family

## 2016-08-28 VITALS — BP 126/59 | HR 55 | Temp 97.9°F | Ht 68.0 in | Wt 241.4 lb

## 2016-08-28 DIAGNOSIS — F419 Anxiety disorder, unspecified: Secondary | ICD-10-CM

## 2016-08-28 DIAGNOSIS — F331 Major depressive disorder, recurrent, moderate: Secondary | ICD-10-CM

## 2016-08-28 DIAGNOSIS — I10 Essential (primary) hypertension: Secondary | ICD-10-CM | POA: Diagnosis not present

## 2016-08-28 NOTE — Patient Instructions (Signed)
Stress and Stress Management Stress is a normal reaction to life events. It is what you feel when life demands more than you are used to or more than you can handle. Some stress can be useful. For example, the stress reaction can help you catch the last bus of the day, study for a test, or meet a deadline at work. But stress that occurs too often or for too long can cause problems. It can affect your emotional health and interfere with relationships and normal daily activities. Too much stress can weaken your immune system and increase your risk for physical illness. If you already have a medical problem, stress can make it worse. What are the causes? All sorts of life events may cause stress. An event that causes stress for one person may not be stressful for another person. Major life events commonly cause stress. These may be positive or negative. Examples include losing your job, moving into a new home, getting married, having a baby, or losing a loved one. Less obvious life events may also cause stress, especially if they occur day after day or in combination. Examples include working long hours, driving in traffic, caring for children, being in debt, or being in a difficult relationship. What are the signs or symptoms? Stress may cause emotional symptoms including, the following:  Anxiety. This is feeling worried, afraid, on edge, overwhelmed, or out of control.  Anger. This is feeling irritated or impatient.  Depression. This is feeling sad, down, helpless, or guilty.  Difficulty focusing, remembering, or making decisions. Stress may cause physical symptoms, including the following:  Aches and pains. These may affect your head, neck, back, stomach, or other areas of your body.  Tight muscles or clenched jaw.  Low energy or trouble sleeping. Stress may cause unhealthy behaviors, including the following:  Eating to feel better (overeating) or skipping meals.  Sleeping too little, too  much, or both.  Working too much or putting off tasks (procrastination).  Smoking, drinking alcohol, or using drugs to feel better. How is this diagnosed? Stress is diagnosed through an assessment by your health care provider. Your health care provider will ask questions about your symptoms and any stressful life events.Your health care provider will also ask about your medical history and may order blood tests or other tests. Certain medical conditions and medicine can cause physical symptoms similar to stress. Mental illness can cause emotional symptoms and unhealthy behaviors similar to stress. Your health care provider may refer you to a mental health professional for further evaluation. How is this treated? Stress management is the recommended treatment for stress.The goals of stress management are reducing stressful life events and coping with stress in healthy ways. Techniques for reducing stressful life events include the following:  Stress identification. Self-monitor for stress and identify what causes stress for you. These skills may help you to avoid some stressful events.  Time management. Set your priorities, keep a calendar of events, and learn to say "no." These tools can help you avoid making too many commitments. Techniques for coping with stress include the following:  Rethinking the problem. Try to think realistically about stressful events rather than ignoring them or overreacting. Try to find the positives in a stressful situation rather than focusing on the negatives.  Exercise. Physical exercise can release both physical and emotional tension. The key is to find a form of exercise you enjoy and do it regularly.  Relaxation techniques. These relax the body and mind. Examples include yoga,  meditation, tai chi, biofeedback, deep breathing, progressive muscle relaxation, listening to music, being out in nature, journaling, and other hobbies. Again, the key is to find one or  more that you enjoy and can do regularly.  Healthy lifestyle. Eat a balanced diet, get plenty of sleep, and do not smoke. Avoid using alcohol or drugs to relax.  Strong support network. Spend time with family, friends, or other people you enjoy being around.Express your feelings and talk things over with someone you trust. Counseling or talktherapy with a mental health professional may be helpful if you are having difficulty managing stress on your own. Medicine is typically not recommended for the treatment of stress.Talk to your health care provider if you think you need medicine for symptoms of stress. Follow these instructions at home:  Keep all follow-up visits as directed by your health care provider.  Take all medicines as directed by your health care provider. Contact a health care provider if:  Your symptoms get worse or you start having new symptoms.  You feel overwhelmed by your problems and can no longer manage them on your own. Get help right away if:  You feel like hurting yourself or someone else. This information is not intended to replace advice given to you by your health care provider. Make sure you discuss any questions you have with your health care provider. Document Released: 10/01/2000 Document Revised: 09/13/2015 Document Reviewed: 11/30/2012 Elsevier Interactive Patient Education  2017 [employer].

## 2016-08-28 NOTE — Progress Notes (Signed)
   Subjective:    Patient ID: Lorraine Leonard, female    DOB: [birth year], 76 y.o.   MRN: [id number]  PT presents to the office today to recheck depression and GAD. PT was started on lexapro 5 mg on 07/17/16. PT states she took it for a few days but made her feel weird and stopped it.  Depression         This is a chronic problem.  The current episode started more than 1 year ago.   The onset quality is gradual.   The problem occurs intermittently.  Associated symptoms include helplessness, hopelessness, restlessness, decreased interest and sad.  Past treatments include nothing.  Past medical history includes anxiety.   Anxiety  Presents for follow-up visit. Symptoms include excessive worry, irritability, nervous/anxious behavior and restlessness. Symptoms occur occasionally.        Review of Systems  Constitutional: Positive for irritability.  Respiratory: Positive for cough.   Gastrointestinal: Positive for abdominal distention and abdominal pain.  Musculoskeletal: Positive for arthralgias and back pain.  Psychiatric/Behavioral: Positive for depression. The patient is nervous/anxious.   All other systems reviewed and are negative.      Objective:   Physical Exam  Constitutional: She is oriented to person, place, and time. She appears well-developed and well-nourished. No distress.  HENT:  Head: Normocephalic and atraumatic.  Right Ear: External ear normal.  Mouth/Throat: Oropharynx is clear and moist.  Eyes: Pupils are equal, round, and reactive to light.  Neck: Normal range of motion. Neck supple. No thyromegaly present.  Cardiovascular: Normal rate, regular rhythm, normal heart sounds and intact distal pulses.   No murmur heard. Pulmonary/Chest: Effort normal and breath sounds normal. No respiratory distress. She has no wheezes.  Abdominal: Soft. Bowel sounds are normal. She exhibits no distension. There is no tenderness.  Musculoskeletal: Normal range of motion. She exhibits no  edema or tenderness.  Neurological: She is alert and oriented to person, place, and time.  Skin: Skin is warm and dry.  Psychiatric: She has a normal mood and affect. Her behavior is normal. Judgment and thought content normal.  Vitals reviewed.     BP (!) 156/66   Pulse (!) 56   Temp 97.9 F (36.6 C) (Oral)   Ht 5\' 8"  (1.727 m)   Wt 241 lb 6.4 oz (109.5 kg)   BMI 36.70 kg/m      Assessment & Plan:  1. Essential hypertension -Dash diet information given -Exercise encouraged - Stress Management  -Continue current meds  2. Moderate episode of recurrent major depressive disorder ([place])  3. Anxiety  PT states she could not take lexapro, she continues the valium as needed Pt encouraged to contact [facility]- They have been calling pt and she has not been answering.  Stress management discussed RTO in 3 months  Evelina Dun, FNP

## 2016-09-02 DIAGNOSIS — R1013 Epigastric pain: Secondary | ICD-10-CM | POA: Diagnosis not present

## 2016-09-02 DIAGNOSIS — K3189 Other diseases of stomach and duodenum: Secondary | ICD-10-CM | POA: Diagnosis not present

## 2016-09-02 DIAGNOSIS — K219 Gastro-esophageal reflux disease without esophagitis: Secondary | ICD-10-CM | POA: Diagnosis not present

## 2016-09-11 ENCOUNTER — Telehealth: Payer: Self-pay | Admitting: Family

## 2016-09-12 NOTE — Telephone Encounter (Signed)
lmtcb

## 2016-09-17 ENCOUNTER — Telehealth: Payer: Self-pay | Admitting: *Deleted

## 2016-09-17 NOTE — Telephone Encounter (Signed)
Patient aware she will need to be seen by provider for her cough. She says she can wait until she visits Dr. Amedeo Plenty on Monday.

## 2016-09-17 NOTE — Telephone Encounter (Signed)
Patient complaining of cough lasting for a month with white sputum.  She would like to have an antibiotic and cough syrup sent to [facility].  Her stomach is bloated and she does not feel well enough to come in for an office visit. Please advise.  Patient is aware Ms Lorraine Leonard will be back tomorrow.

## 2016-09-17 NOTE — Telephone Encounter (Signed)
Lmtcb if still needing [place]

## 2016-09-19 DIAGNOSIS — I4891 Unspecified atrial fibrillation: Secondary | ICD-10-CM

## 2016-09-19 HISTORY — DX: Unspecified atrial fibrillation: I48.91

## 2016-09-22 DIAGNOSIS — K317 Polyp of stomach and duodenum: Secondary | ICD-10-CM | POA: Diagnosis not present

## 2016-09-23 ENCOUNTER — Ambulatory Visit (INDEPENDENT_AMBULATORY_CARE_PROVIDER_SITE_OTHER): Payer: Medicare Other

## 2016-09-23 ENCOUNTER — Emergency Department (HOSPITAL_COMMUNITY): Payer: Medicare Other

## 2016-09-23 ENCOUNTER — Ambulatory Visit (INDEPENDENT_AMBULATORY_CARE_PROVIDER_SITE_OTHER): Payer: Medicare Other | Admitting: Family

## 2016-09-23 ENCOUNTER — Encounter: Payer: Self-pay | Admitting: Family

## 2016-09-23 ENCOUNTER — Encounter (HOSPITAL_COMMUNITY): Payer: Self-pay

## 2016-09-23 ENCOUNTER — Inpatient Hospital Stay (HOSPITAL_COMMUNITY)
Admission: EM | Admit: 2016-09-23 | Discharge: 2016-09-25 | DRG: 189 | Disposition: A | Discharge status: Home / Self Care | Payer: Medicare Other | Attending: Internal Medicine | Admitting: Internal Medicine

## 2016-09-23 VITALS — BP 137/71 | HR 63 | Temp 97.4°F | Ht 68.0 in | Wt 238.8 lb

## 2016-09-23 DIAGNOSIS — R05 Cough: Secondary | ICD-10-CM | POA: Diagnosis not present

## 2016-09-23 DIAGNOSIS — E039 Hypothyroidism, unspecified: Secondary | ICD-10-CM | POA: Diagnosis present

## 2016-09-23 DIAGNOSIS — E785 Hyperlipidemia, unspecified: Secondary | ICD-10-CM | POA: Diagnosis present

## 2016-09-23 DIAGNOSIS — I10 Essential (primary) hypertension: Secondary | ICD-10-CM | POA: Diagnosis present

## 2016-09-23 DIAGNOSIS — I272 Pulmonary hypertension, unspecified: Secondary | ICD-10-CM | POA: Diagnosis present

## 2016-09-23 DIAGNOSIS — J189 Pneumonia, unspecified organism: Secondary | ICD-10-CM | POA: Insufficient documentation

## 2016-09-23 DIAGNOSIS — I5030 Unspecified diastolic (congestive) heart failure: Secondary | ICD-10-CM | POA: Diagnosis not present

## 2016-09-23 DIAGNOSIS — J9801 Acute bronchospasm: Secondary | ICD-10-CM | POA: Diagnosis not present

## 2016-09-23 DIAGNOSIS — Z905 Acquired absence of kidney: Secondary | ICD-10-CM

## 2016-09-23 DIAGNOSIS — R0602 Shortness of breath: Secondary | ICD-10-CM | POA: Diagnosis not present

## 2016-09-23 DIAGNOSIS — J9601 Acute respiratory failure with hypoxia: Principal | ICD-10-CM | POA: Diagnosis present

## 2016-09-23 DIAGNOSIS — R0902 Hypoxemia: Secondary | ICD-10-CM

## 2016-09-23 DIAGNOSIS — E1159 Type 2 diabetes mellitus with other circulatory complications: Secondary | ICD-10-CM | POA: Diagnosis present

## 2016-09-23 DIAGNOSIS — J8410 Pulmonary fibrosis, unspecified: Secondary | ICD-10-CM | POA: Diagnosis not present

## 2016-09-23 DIAGNOSIS — Z7982 Long term (current) use of aspirin: Secondary | ICD-10-CM | POA: Diagnosis not present

## 2016-09-23 DIAGNOSIS — Z79899 Other long term (current) drug therapy: Secondary | ICD-10-CM

## 2016-09-23 DIAGNOSIS — I152 Hypertension secondary to endocrine disorders: Secondary | ICD-10-CM | POA: Diagnosis present

## 2016-09-23 DIAGNOSIS — R059 Cough, unspecified: Secondary | ICD-10-CM

## 2016-09-23 DIAGNOSIS — K317 Polyp of stomach and duodenum: Secondary | ICD-10-CM | POA: Diagnosis not present

## 2016-09-23 DIAGNOSIS — J849 Interstitial pulmonary disease, unspecified: Secondary | ICD-10-CM | POA: Diagnosis not present

## 2016-09-23 DIAGNOSIS — K219 Gastro-esophageal reflux disease without esophagitis: Secondary | ICD-10-CM | POA: Diagnosis present

## 2016-09-23 LAB — CBC WITH DIFFERENTIAL/PLATELET
Basophils Absolute: 0 10*3/uL (ref 0.0–0.1)
Basophils Relative: 0 %
Eosinophils Absolute: 0.3 10*3/uL (ref 0.0–0.7)
Eosinophils Relative: 2 %
HCT: 43.6 % (ref 36.0–46.0)
Hemoglobin: 13.7 g/dL (ref 12.0–15.0)
Lymphocytes Relative: 20 %
Lymphs Abs: 2.8 10*3/uL (ref 0.7–4.0)
MCH: 29.1 pg (ref 26.0–34.0)
MCHC: 31.4 g/dL (ref 30.0–36.0)
MCV: 92.8 fL (ref 78.0–100.0)
Monocytes Absolute: 1.2 10*3/uL — ABNORMAL HIGH (ref 0.1–1.0)
Monocytes Relative: 9 %
Neutro Abs: 10 10*3/uL — ABNORMAL HIGH (ref 1.7–7.7)
Neutrophils Relative %: 69 %
Platelets: 287 10*3/uL (ref 150–400)
RBC: 4.7 MIL/uL (ref 3.87–5.11)
RDW: 15 % (ref 11.5–15.5)
WBC: 14.3 10*3/uL — ABNORMAL HIGH (ref 4.0–10.5)

## 2016-09-23 LAB — BLOOD GAS, ARTERIAL
Acid-Base Excess: 5.8 mmol/L — ABNORMAL HIGH (ref 0.0–2.0)
Bicarbonate: 28.8 mmol/L — ABNORMAL HIGH (ref 20.0–28.0)
Drawn by: 105551
FIO2: 30
O2 Saturation: 91.7 %
pCO2 arterial: 48.5 mmHg — ABNORMAL HIGH (ref 32.0–48.0)
pH, Arterial: 7.412 (ref 7.350–7.450)
pO2, Arterial: 65.1 mmHg — ABNORMAL LOW (ref 83.0–108.0)

## 2016-09-23 LAB — BASIC METABOLIC PANEL
Anion gap: 9 (ref 5–15)
BUN: 9 mg/dL (ref 6–20)
CO2: 33 mmol/L — ABNORMAL HIGH (ref 22–32)
Calcium: 10.1 mg/dL (ref 8.9–10.3)
Chloride: 99 mmol/L — ABNORMAL LOW (ref 101–111)
Creatinine, Ser: 0.98 mg/dL (ref 0.44–1.00)
GFR calc Af Amer: >60 mL/min (ref >60)
GFR calc non Af Amer: 55 mL/min — ABNORMAL LOW (ref >60)
Glucose, Bld: 114 mg/dL — ABNORMAL HIGH (ref 65–99)
Potassium: 4.2 mmol/L (ref 3.5–5.1)
Sodium: 141 mmol/L (ref 135–145)

## 2016-09-23 LAB — BRAIN NATRIURETIC PEPTIDE: B Natriuretic Peptide: 313 pg/mL — ABNORMAL HIGH (ref 0.0–100.0)

## 2016-09-23 LAB — TROPONIN I: Troponin I: <0.03 ng/mL (ref <0.03)

## 2016-09-23 LAB — LACTIC ACID, PLASMA: Lactic Acid, Venous: 1.6 mmol/L (ref 0.5–1.9)

## 2016-09-23 MED ORDER — SODIUM CHLORIDE 0.9% FLUSH
3.0000 mL | Freq: Two times a day (BID) | INTRAVENOUS | Status: DC
  Administered 2016-09-24 – 2016-09-25 (×4): 3 mL via INTRAVENOUS

## 2016-09-23 MED ORDER — NITROGLYCERIN 2 % TD OINT
1.0000 [in_us] | TOPICAL_OINTMENT | Freq: Once | TRANSDERMAL | Status: AC
  Administered 2016-09-24: 1 [in_us] via TOPICAL
  Filled 2016-09-23: qty 1

## 2016-09-23 MED ORDER — ONDANSETRON HCL 4 MG/2ML IJ SOLN
4.0000 mg | Freq: Four times a day (QID) | INTRAMUSCULAR | Status: DC | PRN
  Administered 2016-09-24: 4 mg via INTRAVENOUS
  Filled 2016-09-23: qty 2

## 2016-09-23 MED ORDER — SIMVASTATIN 10 MG PO TABS
40.0000 mg | ORAL_TABLET | Freq: Every day | ORAL | Status: DC
  Administered 2016-09-24: 40 mg via ORAL
  Filled 2016-09-23: qty 4

## 2016-09-23 MED ORDER — METOPROLOL SUCCINATE ER 50 MG PO TB24
50.0000 mg | ORAL_TABLET | Freq: Every day | ORAL | Status: DC
  Administered 2016-09-24 – 2016-09-25 (×2): 50 mg via ORAL
  Filled 2016-09-23 (×2): qty 1

## 2016-09-23 MED ORDER — ALBUTEROL (5 MG/ML) CONTINUOUS INHALATION SOLN: 10.0000 mg/h | INHALATION_SOLUTION | Freq: Once | RESPIRATORY_TRACT | Status: DC

## 2016-09-23 MED ORDER — CHOLECALCIFEROL 10 MCG (400 UNIT) PO TABS
400.0000 [IU] | ORAL_TABLET | Freq: Every day | ORAL | Status: DC
  Administered 2016-09-24 – 2016-09-25 (×2): 400 [IU] via ORAL
  Filled 2016-09-23 (×3): qty 1

## 2016-09-23 MED ORDER — ALBUTEROL SULFATE (2.5 MG/3ML) 0.083% IN NEBU
2.5000 mg | INHALATION_SOLUTION | Freq: Once | RESPIRATORY_TRACT | Status: AC
  Administered 2016-09-23: 2.5 mg via RESPIRATORY_TRACT
  Filled 2016-09-23: qty 3

## 2016-09-23 MED ORDER — SODIUM CHLORIDE 0.9 % IV SOLN: 250.0000 mL | INTRAVENOUS | Status: DC | PRN

## 2016-09-23 MED ORDER — IPRATROPIUM BROMIDE 0.02 % IN SOLN: 1.0000 mg | Freq: Once | RESPIRATORY_TRACT | Status: DC

## 2016-09-23 MED ORDER — IOPAMIDOL (ISOVUE-370) INJECTION 76%
100.0000 mL | Freq: Once | INTRAVENOUS | Status: AC | PRN
  Administered 2016-09-23: 100 mL via INTRAVENOUS

## 2016-09-23 MED ORDER — VITAMIN E 180 MG (400 UNIT) PO CAPS
400.0000 [IU] | ORAL_CAPSULE | Freq: Every day | ORAL | Status: DC
  Administered 2016-09-24 – 2016-09-25 (×2): 400 [IU] via ORAL
  Filled 2016-09-23 (×2): qty 1

## 2016-09-23 MED ORDER — ALBUTEROL SULFATE (2.5 MG/3ML) 0.083% IN NEBU: 2.5000 mg | INHALATION_SOLUTION | RESPIRATORY_TRACT | Status: DC | PRN

## 2016-09-23 MED ORDER — PREDNISONE 10 MG (21) PO TBPK: ORAL_TABLET | ORAL | 0 refills | Status: DC

## 2016-09-23 MED ORDER — HEPARIN SODIUM (PORCINE) 5000 UNIT/ML IJ SOLN
5000.0000 [IU] | Freq: Three times a day (TID) | INTRAMUSCULAR | Status: DC
  Administered 2016-09-24 – 2016-09-25 (×4): 5000 [IU] via SUBCUTANEOUS
  Filled 2016-09-23 (×4): qty 1

## 2016-09-23 MED ORDER — VITAMIN D (ERGOCALCIFEROL) 1.25 MG (50000 UNIT) PO CAPS
50000.0000 [IU] | ORAL_CAPSULE | ORAL | Status: DC
  Administered 2016-09-24: 50000 [IU] via ORAL
  Filled 2016-09-23: qty 1

## 2016-09-23 MED ORDER — EPINEPHRINE 0.3 MG/0.3ML IJ SOAJ
0.3000 mg | Freq: Once | INTRAMUSCULAR | Status: DC | PRN
  Filled 2016-09-23: qty 0.3

## 2016-09-23 MED ORDER — CEFTRIAXONE SODIUM 1 G IJ SOLR: 500.0000 mg | Freq: Once | INTRAMUSCULAR | Status: DC

## 2016-09-23 MED ORDER — TRIMETHOPRIM 100 MG PO TABS
100.0000 mg | ORAL_TABLET | Freq: Every day | ORAL | Status: DC
  Administered 2016-09-24 – 2016-09-25 (×2): 100 mg via ORAL
  Filled 2016-09-23 (×3): qty 1

## 2016-09-23 MED ORDER — SODIUM CHLORIDE 0.9% FLUSH: 3.0000 mL | INTRAVENOUS | Status: DC | PRN

## 2016-09-23 MED ORDER — PANTOPRAZOLE SODIUM 40 MG PO TBEC
40.0000 mg | DELAYED_RELEASE_TABLET | Freq: Every day | ORAL | Status: DC
  Administered 2016-09-24 – 2016-09-25 (×2): 40 mg via ORAL
  Filled 2016-09-23 (×2): qty 1

## 2016-09-23 MED ORDER — POLYETHYLENE GLYCOL 3350 17 G PO PACK: 17.0000 g | PACK | Freq: Every day | ORAL | Status: DC | PRN

## 2016-09-23 MED ORDER — CALCIUM CITRATE 950 (200 CA) MG PO TABS
1.0000 | ORAL_TABLET | Freq: Two times a day (BID) | ORAL | Status: DC
  Filled 2016-09-23 (×2): qty 1

## 2016-09-23 MED ORDER — IPRATROPIUM-ALBUTEROL 0.5-2.5 (3) MG/3ML IN SOLN
RESPIRATORY_TRACT | Status: AC
  Administered 2016-09-23: 3 mL via RESPIRATORY_TRACT
  Filled 2016-09-23: qty 3

## 2016-09-23 MED ORDER — VITAMIN C 500 MG PO TABS
500.0000 mg | ORAL_TABLET | Freq: Every day | ORAL | Status: DC
  Administered 2016-09-24 – 2016-09-25 (×2): 500 mg via ORAL
  Filled 2016-09-23 (×2): qty 1

## 2016-09-23 MED ORDER — LEVOTHYROXINE SODIUM 50 MCG PO TABS
100.0000 ug | ORAL_TABLET | Freq: Every day | ORAL | Status: DC
  Administered 2016-09-24 – 2016-09-25 (×2): 100 ug via ORAL
  Filled 2016-09-23 (×2): qty 2

## 2016-09-23 MED ORDER — GUAIFENESIN ER 600 MG PO TB12
600.0000 mg | ORAL_TABLET | Freq: Two times a day (BID) | ORAL | Status: DC
  Administered 2016-09-24 – 2016-09-25 (×3): 600 mg via ORAL
  Filled 2016-09-23 (×4): qty 1

## 2016-09-23 MED ORDER — ONDANSETRON HCL 4 MG PO TABS: 4.0000 mg | ORAL_TABLET | Freq: Four times a day (QID) | ORAL | Status: DC | PRN

## 2016-09-23 MED ORDER — TRAZODONE HCL 50 MG PO TABS
50.0000 mg | ORAL_TABLET | Freq: Every evening | ORAL | Status: DC | PRN
  Administered 2016-09-24 (×2): 50 mg via ORAL
  Filled 2016-09-23 (×2): qty 1

## 2016-09-23 MED ORDER — HYDRALAZINE HCL 20 MG/ML IJ SOLN: 10.0000 mg | Freq: Four times a day (QID) | INTRAMUSCULAR | Status: DC | PRN

## 2016-09-23 MED ORDER — ONDANSETRON HCL 4 MG PO TABS: 8.0000 mg | ORAL_TABLET | Freq: Three times a day (TID) | ORAL | Status: DC | PRN

## 2016-09-23 MED ORDER — LEVOFLOXACIN 500 MG PO TABS: 500.0000 mg | ORAL_TABLET | Freq: Every day | ORAL | 0 refills | Status: DC

## 2016-09-23 MED ORDER — IPRATROPIUM-ALBUTEROL 0.5-2.5 (3) MG/3ML IN SOLN
3.0000 mL | Freq: Four times a day (QID) | RESPIRATORY_TRACT | Status: DC
  Administered 2016-09-23 – 2016-09-24 (×5): 3 mL via RESPIRATORY_TRACT
  Filled 2016-09-23 (×4): qty 3

## 2016-09-23 MED ORDER — ASPIRIN EC 81 MG PO TBEC
81.0000 mg | DELAYED_RELEASE_TABLET | Freq: Every day | ORAL | Status: DC
  Administered 2016-09-24 – 2016-09-25 (×2): 81 mg via ORAL
  Filled 2016-09-23 (×2): qty 1

## 2016-09-23 MED ORDER — ACETAMINOPHEN 325 MG PO TABS: 650.0000 mg | ORAL_TABLET | Freq: Four times a day (QID) | ORAL | Status: DC | PRN

## 2016-09-23 MED ORDER — VITAMIN B-12 1000 MCG PO TABS
500.0000 ug | ORAL_TABLET | Freq: Every day | ORAL | Status: DC
  Administered 2016-09-24 – 2016-09-25 (×2): 500 ug via ORAL
  Filled 2016-09-23 (×3): qty 1

## 2016-09-23 MED ORDER — LEVOFLOXACIN IN D5W 750 MG/150ML IV SOLN
750.0000 mg | INTRAVENOUS | Status: DC
  Administered 2016-09-23 – 2016-09-24 (×2): 750 mg via INTRAVENOUS
  Filled 2016-09-23 (×2): qty 150

## 2016-09-23 MED ORDER — GUAIFENESIN 100 MG/5ML PO SOLN: 10.0000 mL | ORAL | Status: DC | PRN

## 2016-09-23 MED ORDER — SENNA 8.6 MG PO TABS
1.0000 | ORAL_TABLET | Freq: Two times a day (BID) | ORAL | Status: DC
  Administered 2016-09-24 – 2016-09-25 (×4): 8.6 mg via ORAL
  Filled 2016-09-23 (×4): qty 1

## 2016-09-23 MED ORDER — ACETAMINOPHEN 650 MG RE SUPP: 650.0000 mg | Freq: Four times a day (QID) | RECTAL | Status: DC | PRN

## 2016-09-23 MED ORDER — GATIFLOXACIN 0.5 % OP SOLN
1.0000 [drp] | Freq: Four times a day (QID) | OPHTHALMIC | Status: DC
  Administered 2016-09-24 (×3): 1 [drp] via OPHTHALMIC
  Filled 2016-09-23: qty 2.5

## 2016-09-23 MED ORDER — ESTROGENS, CONJUGATED 0.625 MG/GM VA CREA
0.5000 g | TOPICAL_CREAM | VAGINAL | Status: DC
  Administered 2016-09-25: 0.25 via VAGINAL
  Filled 2016-09-23: qty 30

## 2016-09-23 MED ORDER — IPRATROPIUM-ALBUTEROL 0.5-2.5 (3) MG/3ML IN SOLN
3.0000 mL | Freq: Once | RESPIRATORY_TRACT | Status: AC
  Administered 2016-09-23: 3 mL via RESPIRATORY_TRACT
  Filled 2016-09-23: qty 3

## 2016-09-23 NOTE — ED Notes (Signed)
RT notified of order for breathing treatment as well as ABG

## 2016-09-23 NOTE — ED Notes (Signed)
Pt's SpO2 on room air was at 81%. Pt placed on Nasal cannula at 2 L/min. O2 sat raised to 98%. RN Dorian Pod Flueckiger notified

## 2016-09-23 NOTE — Patient Instructions (Signed)
Community-Acquired Pneumonia, Adult °Pneumonia is an infection of the lungs. There are different types of pneumonia. One type can develop while a person is in a hospital. A different type, called community-acquired pneumonia, develops in people who are not, or have not recently been, in the hospital or other health care facility. °What are the causes? °Pneumonia may be caused by bacteria, viruses, or funguses. Community-acquired pneumonia is often caused by Streptococcus pneumonia bacteria. These bacteria are often passed from one person to another by breathing in droplets from the cough or sneeze of an infected person. °What increases the risk? °The condition is more likely to develop in: °· People who have chronic diseases, such as chronic obstructive pulmonary disease (COPD), asthma, congestive heart failure, cystic fibrosis, diabetes, or kidney disease. °· People who have early-stage or late-stage HIV. °· People who have sickle cell disease. °· People who have had their spleen removed (splenectomy). °· People who have poor dental hygiene. °· People who have medical conditions that increase the risk of breathing in (aspirating) secretions their own mouth and nose. °· People who have a weakened immune system (immunocompromised). °· People who smoke. °· People who travel to areas where pneumonia-causing germs commonly exist. °· People who are around animal habitats or animals that have pneumonia-causing germs, including birds, bats, rabbits, cats, and farm animals. ° °What are the signs or symptoms? °Symptoms of this condition include: °· A dry cough. °· A wet (productive) cough. °· Fever. °· Sweating. °· Chest pain, especially when breathing deeply or coughing. °· Rapid breathing or difficulty breathing. °· Shortness of breath. °· Shaking chills. °· Fatigue. °· Muscle aches. ° °How is this diagnosed? °Your health care provider will take a medical history and perform a physical exam. You may also have other tests,  including: °· Imaging studies of your chest, including X-rays. °· Tests to check your blood oxygen level and other blood gases. °· Other tests on blood, mucus (sputum), fluid around your lungs (pleural fluid), and urine. ° °If your pneumonia is severe, other tests may be done to identify the specific cause of your illness. °How is this treated? °The type of treatment that you receive depends on many factors, such as the cause of your pneumonia, the medicines you take, and other medical conditions that you have. For most adults, treatment and recovery from pneumonia may occur at home. In some cases, treatment must happen in a hospital. Treatment may include: °· Antibiotic medicines, if the pneumonia was caused by bacteria. °· Antiviral medicines, if the pneumonia was caused by a virus. °· Medicines that are given by mouth or through an IV tube. °· Oxygen. °· Respiratory therapy. ° °Although rare, treating severe pneumonia may include: °· Mechanical ventilation. This is done if you are not breathing well on your own and you cannot maintain a safe blood oxygen level. °· Thoracentesis. This procedure removes fluid around one lung or both lungs to help you breathe better. ° °Follow these instructions at home: °· Take over-the-counter and prescription medicines only as told by your health care provider. °? Only take cough medicine if you are losing sleep. Understand that cough medicine can prevent your body’s natural ability to remove mucus from your lungs. °? If you were prescribed an antibiotic medicine, take it as told by your health care provider. Do not stop taking the antibiotic even if you start to feel better. °· Sleep in a semi-upright position at night. Try sleeping in a reclining chair, or place a few pillows under your head. °· Do not use tobacco products, including cigarettes, chewing   tobacco, and e-cigarettes. If you need help quitting, ask your health care provider. °· Drink enough water to keep your urine  clear or pale yellow. This will help to thin out mucus secretions in your lungs. °How is this prevented? °There are ways that you can decrease your risk of developing community-acquired pneumonia. Consider getting a pneumococcal vaccine if: °· You are older than 76 years of age. °· You are older than 76 years of age and are undergoing cancer treatment, have chronic lung disease, or have other medical conditions that affect your immune system. Ask your health care provider if this applies to you. ° °There are different types and schedules of pneumococcal vaccines. Ask your health care provider which vaccination option is best for you. °You may also prevent community-acquired pneumonia if you take these actions: °· Get an influenza vaccine every year. Ask your health care provider which type of influenza vaccine is best for you. °· Go to the dentist on a regular basis. °· Wash your hands often. Use hand sanitizer if soap and water are not available. ° °Contact a health care provider if: °· You have a fever. °· You are losing sleep because you cannot control your cough with cough medicine. °Get help right away if: °· You have worsening shortness of breath. °· You have increased chest pain. °· Your sickness becomes worse, especially if you are an older adult or have a weakened immune system. °· You cough up blood. °This information is not intended to replace advice given to you by your health care provider. Make sure you discuss any questions you have with your health care provider. °Document Released: 04/07/2005 Document Revised: 08/16/2015 Document Reviewed: 08/02/2014 °[employer] Interactive Patient Education © 2017 [employer] ° °

## 2016-09-23 NOTE — H&P (Signed)
Patient Demographics:    Lorraine Leonard, is a 76 y.o. female  MRN: [id number]   DOB - [birth year]  Admit Date - 09/23/2016  Outpatient Primary MD for the patient is Sharion Balloon, FNP   Assessment & Plan:    Principal Problem:   Acute respiratory failure with hypoxia ([place]) Active Problems:   Essential hypertension   GASTROESOPHAGEAL REFLUX DISEASE   Single kidney   Pulmonary hypertension (HCC)   Pulmonary fibrosis (HCC)    1)Acute Hypoxic Resp Failure- Patient has severe hypoxia with O2 sats down to 75 with minimal activity in the room, at rest O2 sats was 84-87%, ??? Pulm Fibrosis and ??? Pulm HTN, Give supplemental oxygen as ordered, use bronchialdilators, mucolytics as ordered,. Hold off on steroids at this time (no evidence of COPD or Asthma), however patient has productive cough and leukocytosis so treat empirically with IV Levaquin for possible concomitant respiratory infection. Get echo kilograms to evaluate for possible pulmonary hypertension the setting of possible pulmonary fibrosis. Get pulmonary consult from Dr. Luan Pulling to evaluate patient for pulmonary fibrosis. CTA chest report from 09/23/2016 noted (CT chest in the ED suggest pulmonary fibrosis and pulmonary hypertension, No Pulm Embolism). ABG done on FiO2 of 30% suggest some CO2 retention, consider BiPAP/CPAP use if hypercapnia worsens.   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 Allergic rhinitis   . Anxiety   . Anxiety   .  Depression   . Diabetes mellitus    pre-diabetic  . Diverticulosis   . GERD (gastroesophageal reflux disease)   . Hyperlipidemia   . Hypertension   . Hypothyroidism   . Lung fibrosis ([place])   . Macular degeneration   . Rheumatoid arthritis(714.0)   . Urticaria       Past Surgical History:  Procedure Laterality Date  . EUS N/A 10/03/2015   Procedure: UPPER ENDOSCOPIC ULTRASOUND (EUS) RADIAL;  Surgeon: Arta Silence, MD;  Location: [facility];  Service: Endoscopy;  Laterality: N/A;  . FINE NEEDLE ASPIRATION N/A 10/03/2015   Procedure: FINE NEEDLE ASPIRATION (FNA) RADIAL;  Surgeon: Arta Silence, MD;  Location: [facility];  Service: Endoscopy;  Laterality: N/A;  . 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 LUMBAR DISC SURGERY     x 2  . TUBAL LIGATION    . VAGINAL HYSTERECTOMY        Chief Complaint  Patient presents with  . Cough      HPI:    Lorraine Leonard  is a 76 y.o. female with past medical history relevant for status post previous nephrectomy (donated one kidney  to her sister), hypertension, glucose intolerance as well as hypothyroidism and anxiety who presents from the clinic with worsening respiratory status and hypoxia. Patient gives a history of recurrent URIs with productive sputum, No fever  Or chills . Marland KitchenNo nausea, vomiting, diarrhea   O2 sats down to 75 with minimal activity in the room, at rest O2 sats was 84-87%,   CT chest in the ED suggest pulmonary fibrosis and pulmonary hypertension  No tobacco use, no prior history of COPD, No Asthma  Additional history obtained from patient's husband at bedside  No pleuritic symptoms      Review of systems:    In addition to the HPI above,   A full 12 point Review of 10 Systems was done, except as stated above, all other Review of 10 Systems were negative.    Social History:  Reviewed by me    Social History  Substance Use Topics  . Smoking status: Never Smoker  . Smokeless tobacco: Never  Used  . Alcohol use No       Family History :  Reviewed by me    Family History  Problem Relation Age of Onset  . Diabetes Unknown   . Hypertension Unknown   . Coronary artery disease Unknown   . Stroke Unknown   . Asthma Unknown   . Kidney disease Sister   . Lung cancer Sister   . Lung cancer Sister   . Suicidality Son        committed suicide in 2009  . Hyperlipidemia Unknown   . Anxiety disorder Unknown   . Migraines Unknown      Home Medications:   Prior to Admission medications   Medication Sig Start Date End Date Taking? Authorizing Provider  aspirin EC 81 MG tablet Take 81 mg by mouth daily.   Yes [provider]  azelastine (ASTELIN) 0.1 % nasal spray PLACE 2 SPRAYS INTO BOTH NOSTRILS 2 (TWO) TIMES DAILY AS NEEDED. Patient taking differently: PLACE 2 SPRAYS INTO BOTH NOSTRILS AT BEDTIME 11/20/15  Yes Hawks, Christy A, FNP  calcium citrate (CALCITRATE - DOSED IN MG ELEMENTAL CALCIUM) 950 MG tablet Take 1 tablet by mouth 2 (two) times daily.    Yes [provider]  cholecalciferol (VITAMIN D) 400 units TABS tablet Take 400 Units by mouth daily.   Yes [provider]  conjugated estrogens (PREMARIN) vaginal cream Place 0.5 g vaginally every Thursday. Use as directed   Yes [provider]  cyanocobalamin 500 MCG tablet Take 500 mcg by mouth daily.    Yes [provider]  DEXILANT 60 MG capsule TAKE 1 CAPSULE (60 MG TOTAL) BY MOUTH DAILY. 05/14/16  Yes Hawks, Christy A, FNP  diazepam (VALIUM) 5 MG tablet TAKE 1 TABLET TWICE A DAY AS NEEDED FOR ANXIETY Patient taking differently: Take 5 mg by mouth 2 (two) times daily as needed for anxiety.  07/17/16  Yes Hawks, Christy A, FNP  EPINEPHrine 0.3 mg/0.3 mL IJ SOAJ injection Inject 0.3 mg into the muscle once as needed (For anaphylaxis.).   Yes [provider]  fluticasone (FLONASE) 50 MCG/ACT nasal spray PLACE 1 SPRAY INTO BOTH NOSTRILS 2 (TWO) TIMES DAILY AS NEEDED FOR  ALLERGIES OR RHINITIS. Patient taking differently: Place 1 spray into both nostrils 2 (two) times daily.  06/16/16  Yes Hawks, Christy A, FNP  levocetirizine (XYZAL) 5 MG tablet Take 5 mg by mouth at bedtime.  07/16/15  Yes [provider]  levothyroxine (SYNTHROID) 100 MCG tablet Take  1 tablet (100 mcg total) by mouth daily before breakfast. 04/23/16  Yes Hawks, Christy A, FNP  metoprolol succinate (TOPROL-XL) 50 MG 24 hr tablet TAKE 1 TABLET (50 MG TOTAL) BY MOUTH DAILY. TAKE WITH OR IMMEDIATELY FOLLOWING A MEAL. 03/07/16  Yes Hawks, Christy A, FNP  mometasone (ELOCON) 0.1 % cream APPLY TO AFFECTED AREA DAILY 08/12/16  Yes Hawks, Christy A, FNP  ondansetron (ZOFRAN) 8 MG tablet Take 8 mg by mouth every 8 (eight) hours as needed for nausea or vomiting.   Yes [provider]  PATADAY 0.2 % SOLN Place 1 drop into both eyes daily. 06/10/12  Yes [provider]  polyethylene glycol powder (GLYCOLAX/MIRALAX) powder TAKE 17 G BY MOUTH 2 (TWO) TIMES DAILY AS NEEDED. 02/01/16  Yes Sharion Balloon, FNP  PROAIR HFA 108 (90 Base) MCG/ACT inhaler INHALE 2 PUFFS INTO THE LUNGS EVERY 6 (SIX) HOURS AS NEEDED FOR WHEEZING. 09/13/15  Yes Evelina Dun A, FNP  simvastatin (ZOCOR) 40 MG tablet TAKE 1 TABLET (40 MG TOTAL) BY MOUTH AT BEDTIME. 06/10/16  Yes Hawks, Christy A, FNP  traMADol (ULTRAM) 50 MG tablet TAKE 1 TABLET BY MOUTH EVERY 6 HOURS AS NEEDED Patient taking differently: TAKE 1 TABLET BY MOUTH EVERY 6 HOURS AS NEEDED FOR PAIN 12/03/15  Yes Hawks, Christy A, FNP  trimethoprim (TRIMPEX) 100 MG tablet Take 100 mg by mouth daily.   Yes [provider]  valsartan-hydrochlorothiazide (DIOVAN-HCT) 160-12.5 MG tablet TAKE 1 TABLET BY MOUTH ONCE EVERY DAY 05/20/16  Yes Hawks, Christy A, FNP  VIGAMOX 0.5 % ophthalmic solution Apply 1 drop to eye as needed (Instill into affected eye four times daily for 2 days after each eye injection for macular degeneration.).  08/30/15  Yes [provider]  vitamin C (ASCORBIC ACID) 500 MG tablet Take 500 mg by mouth daily.   Yes [provider]  Vitamin D, Ergocalciferol, (DRISDOL) 50000 units CAPS capsule TAKE 1 CAPSULE (50,000 UNITS TOTAL) BY MOUTH EVERY 7 (SEVEN) DAYS. 07/07/16  Yes Hawks, Christy A, FNP  vitamin E 400 UNIT capsule Take 400 Units by mouth daily.   Yes [provider]  levofloxacin (LEVAQUIN) 500 MG tablet Take 1 tablet (500 mg total) by mouth daily. 09/23/16   Hawks, Alyse Low A, FNP  OVER THE COUNTER MEDICATION Place 1 drop into both eyes as needed (For dry eyes.). Systane Gel Drops    [provider]     Allergies:     Allergies  Allergen Reactions  . Allopurinol   . Mobic [Meloxicam] Swelling and Other (See Comments)    Leg swelling     Physical Exam:   Vitals  Blood pressure (!) 145/64, pulse 73, temperature 98.6 F (37 C), temperature source Oral, resp. rate 16, height 5\' 8"  (1.727 m), weight 108 kg (238 lb), SpO2 (!) 88 %.  Physical Examination: General appearance - alert, well appearing, and in no distress  Mental status - alert, oriented to person, place, and time Nose- [place] 3 L/min Eyes - sclera anicteric Neck - supple, no JVD elevation , Chest - scattered rhonchi and wheezes bilaterally, diminished breath sounds in the left lung base Heart - S1 and S2 normal,  Abdomen - soft, nontender, nondistended, no masses or organomegaly Neurological - screening mental status exam normal, neck supple without rigidity, cranial nerves II through XII intact, DTR's normal and symmetric Extremities - no pedal edema noted, intact peripheral pulses  Skin - warm, dry    Data Review:  CBC  Recent Labs Lab 09/23/16 1955  WBC 14.3*  HGB 13.7  HCT 43.6  PLT 287  MCV 92.8  MCH 29.1  MCHC 31.4  RDW 15.0  LYMPHSABS 2.8  MONOABS 1.2*  EOSABS 0.3  BASOSABS 0.0    ------------------------------------------------------------------------------------------------------------------  Chemistries   Recent Labs Lab 09/23/16 1955  NA 141  K 4.2  CL 99*  CO2 33*  GLUCOSE 114*  BUN 9  CREATININE 0.98  CALCIUM 10.1   FIO2  30.00   Delivery systems  NASAL CANNULA   pH, Arterial 7.350 - 7.450 7.412   pCO2 arterial 32.0 - 48.0 mmHg 48.5    pO2, Arterial 83.0 - 108.0 mmHg 65.1    Bicarbonate 20.0 - 28.0 mmol/L 28.8    Acid-Base Excess 0.0 - 2.0 mmol/L 5.8    O2 Saturation % 91.7   Collection site  RADIAL   Drawn by  [id number]   Sample type  ARTERIAL      ------------------------------------------------------------------------------------------------------------------ estimated creatinine clearance is 62.8 mL/min (by C-G formula based on SCr of 0.98 mg/dL). ------------------------------------------------------------------------------------------------------------------ No results for input(s): TSH, T4TOTAL, T3FREE, THYROIDAB in the last 72 hours.  Invalid input(s): FREET3   Coagulation profile No results for input(s): INR, PROTIME in the last 168 hours. ------------------------------------------------------------------------------------------------------------------- No results for input(s): DDIMER in the last 72 hours. -------------------------------------------------------------------------------------------------------------------  Cardiac Enzymes  Recent Labs Lab 09/23/16 1955  TROPONINI <0.03   ------------------------------------------------------------------------------------------------------------------    Component Value Date/Time   BNP 313.0 (H) 09/23/2016 1955     ---------------------------------------------------------------------------------------------------------------  Urinalysis    Component Value Date/Time   COLORURINE YELLOW 05/14/2011 1942   APPEARANCEUR CLEAR 05/14/2011 1942   LABSPEC 1.009 05/14/2011  1942   PHURINE 6.0 05/14/2011 1942   GLUCOSEU NEGATIVE 05/14/2011 1942   HGBUR TRACE (A) 05/14/2011 1942   BILIRUBINUR neg 04/06/2015 1517   KETONESUR NEGATIVE 05/14/2011 1942   PROTEINUR neg 04/06/2015 1517   PROTEINUR NEGATIVE 05/14/2011 1942   UROBILINOGEN negative 04/06/2015 1517   UROBILINOGEN 0.2 05/14/2011 1942   NITRITE neg 04/06/2015 1517   NITRITE NEGATIVE 05/14/2011 1942   LEUKOCYTESUR large (3+) (A) 04/06/2015 1517    ----------------------------------------------------------------------------------------------------------------   Imaging Results:    Dg Chest 2 View  Result Date: 09/23/2016 CLINICAL DATA:  Shortness of breath with productive cough EXAM: CHEST  2 VIEW COMPARISON:  CT 11/17/2014, radiograph 11/02/2013 FINDINGS: Hyperinflation. Streaky atelectasis or infiltrate at the left base. No pleural effusion. Stable cardiomediastinal silhouette with atherosclerosis. No pneumothorax. IMPRESSION: Mild streaky atelectasis or infiltrate at the left base. Electronically Signed   By: Donavan Foil M.D.   On: 09/23/2016 16:24   Ct Angio Chest Pe W/cm &/or Wo Cm  Result Date: 09/23/2016 CLINICAL DATA:  Cough, shortness of breath for 1.5 months EXAM: CT ANGIOGRAPHY CHEST WITH CONTRAST TECHNIQUE: Multidetector CT imaging of the chest was performed using the standard protocol during bolus administration of intravenous contrast. Multiplanar CT image reconstructions and MIPs were obtained to evaluate the vascular anatomy. CONTRAST:  100 mL Isovue 370 COMPARISON:  11/17/2014 FINDINGS: Cardiovascular: Satisfactory opacification of the pulmonary arteries to the segmental level. No evidence of pulmonary embolism. Main pulmonary artery is dilated measuring 4.2 cm in diameter. Right main pulmonary artery is dilated measuring 3 cm. Normal heart size. No pericardial effusion. Thoracic aorta is normal in caliber. No thoracic aortic dissection. Thoracic aortic atherosclerosis. Mediastinum/Nodes:  Right lower paratracheal lymph node measures 13 mm in short axis. Mild right hilar lymphadenopathy with the largest measuring 10 mm in short axis. Thyroid  gland, trachea, and esophagus demonstrate no significant findings. Lungs/Pleura: 6 mm right lower lobe pulmonary nodule. Bilateral lower lobe mild interstitial thickening consistent with mild fibrosis which has progressed compared with 11/17/2014. No focal consolidation, pleural effusion or pneumothorax. Upper Abdomen: No acute abnormality. Musculoskeletal: No acute osseous abnormality. No lytic or sclerotic osseous lesion. Review of the MIP images confirms the above findings. IMPRESSION: 1. No pulmonary artery embolus. 2. Dilatation of the main pulmonary artery as can be seen with pulmonary arterial hypertension. 3. Stable 6 mm right lower lobe pulmonary nodule unchanged compared with 11/17/2014. Electronically Signed   By: Kathreen Devoid   On: 09/23/2016 21:22    Radiological Exams on Admission: Dg Chest 2 View  Result Date: 09/23/2016 CLINICAL DATA:  Shortness of breath with productive cough EXAM: CHEST  2 VIEW COMPARISON:  CT 11/17/2014, radiograph 11/02/2013 FINDINGS: Hyperinflation. Streaky atelectasis or infiltrate at the left base. No pleural effusion. Stable cardiomediastinal silhouette with atherosclerosis. No pneumothorax. IMPRESSION: Mild streaky atelectasis or infiltrate at the left base. Electronically Signed   By: Donavan Foil M.D.   On: 09/23/2016 16:24   Ct Angio Chest Pe W/cm &/or Wo Cm  Result Date: 09/23/2016 CLINICAL DATA:  Cough, shortness of breath for 1.5 months EXAM: CT ANGIOGRAPHY CHEST WITH CONTRAST TECHNIQUE: Multidetector CT imaging of the chest was performed using the standard protocol during bolus administration of intravenous contrast. Multiplanar CT image reconstructions and MIPs were obtained to evaluate the vascular anatomy. CONTRAST:  100 mL Isovue 370 COMPARISON:  11/17/2014 FINDINGS: Cardiovascular: Satisfactory  opacification of the pulmonary arteries to the segmental level. No evidence of pulmonary embolism. Main pulmonary artery is dilated measuring 4.2 cm in diameter. Right main pulmonary artery is dilated measuring 3 cm. Normal heart size. No pericardial effusion. Thoracic aorta is normal in caliber. No thoracic aortic dissection. Thoracic aortic atherosclerosis. Mediastinum/Nodes: Right lower paratracheal lymph node measures 13 mm in short axis. Mild right hilar lymphadenopathy with the largest measuring 10 mm in short axis. Thyroid gland, trachea, and esophagus demonstrate no significant findings. Lungs/Pleura: 6 mm right lower lobe pulmonary nodule. Bilateral lower lobe mild interstitial thickening consistent with mild fibrosis which has progressed compared with 11/17/2014. No focal consolidation, pleural effusion or pneumothorax. Upper Abdomen: No acute abnormality. Musculoskeletal: No acute osseous abnormality. No lytic or sclerotic osseous lesion. Review of the MIP images confirms the above findings. IMPRESSION: 1. No pulmonary artery embolus. 2. Dilatation of the main pulmonary artery as can be seen with pulmonary arterial hypertension. 3. Stable 6 mm right lower lobe pulmonary nodule unchanged compared with 11/17/2014. Electronically Signed   By: Kathreen Devoid   On: 09/23/2016 21:22    DVT Prophylaxis -SCD   AM Labs Ordered, also please review Full Orders  Family Communication: Admission, patients condition and plan of care including tests being ordered have been discussed with the patient and husband who indicate understanding and agree with the plan   Code Status - Full Code  Likely DC to  Home with oxygen and home health services  Condition   stable  Chundra Sauerwein M.D on 09/23/2016 at 10:27 PM   Between 7am to 7pm - Pager - [phone]  After 7pm go to [email or web address] - password [email or web address]  [facility] - Office  [phone]  Voice Recognition Viviann Spare dictation system was used to create  this note, attempts have been made to correct errors. Please contact the author with questions and/or clarifications.

## 2016-09-23 NOTE — ED Notes (Signed)
Call to 3rd floor to give report.  RN will call me back

## 2016-09-23 NOTE — ED Provider Notes (Signed)
[facility] DEPT Provider Note   CSN: [id number] Arrival date & time: 09/23/16  1807     History   Chief Complaint Chief Complaint  Patient presents with  . Cough    HPI Lorraine Leonard is a 76 y.o. female.  HPI Pt was seen at [facility]. Per pt, c/o gradual onset and persistence of constant cough for the past 1 month. Pt has been evaluated by her PMD for same 1 month ago, dx allergies. Pt was evaluated by her PMD again today and told she "had pneumonia" and to come to the ED for further evaluation due to "low O2 Sats." Describes the sputum as "thick" and "white." Has been associated with SOB and intermittent "wheezing."  Denies fevers, no CP/palpitations, no back pain, no abd pain, no N/V/D.   Past Medical History:  Diagnosis Date  . Allergic rhinitis   . Anxiety   . Anxiety   . Depression   . Diabetes mellitus    pre-diabetic  . Diverticulosis   . GERD (gastroesophageal reflux disease)   . Hyperlipidemia   . Hypertension   . Hypothyroidism   . 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 Macular degeneration   . Rheumatoid arthritis(714.0)   . Urticaria     Patient Active Problem List   Diagnosis Date Noted  . Diabetes mellitus ([place]) 07/17/2016  . Morbid obesity ([place]) 04/18/2016  . Snoring 11/09/2015  . Pulmonary hypertension ([place]) 11/09/2015  . Gout 10/19/2015  . Chronic urticaria 03/24/2015  . Metabolic syndrome 20/25/4270  . Single kidney 03/01/2015  . Macular degeneration of both eyes 11/28/2014  . Constipation 11/28/2014  . Chronic rhinitis 11/17/2014  . Hypertensive retinopathy 10/30/2014  . Vitamin D deficiency 08/17/2014  . Arthritis of knee 08/17/2014  . Hyperlipemia 08/17/2014  . Depression   . Hypothyroidism   . Anxiety   . Cardiomegaly 05/05/2011  . Essential hypertension 02/08/2007  . 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 GASTROESOPHAGEAL REFLUX DISEASE 02/08/2007    Past Surgical History:  Procedure Laterality Date  . EUS N/A  10/03/2015   Procedure: UPPER ENDOSCOPIC ULTRASOUND (EUS) RADIAL;  Surgeon: Arta Silence, MD;  Location: [facility] ENDOSCOPY;  Service: Endoscopy;  Laterality: N/A;  . FINE NEEDLE ASPIRATION N/A 10/03/2015   Procedure: FINE NEEDLE ASPIRATION (FNA) RADIAL;  Surgeon: Arta Silence, MD;  Location: [facility] ENDOSCOPY;  Service: Endoscopy;  Laterality: N/A;  . 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 LUMBAR DISC SURGERY     x 2  . TUBAL LIGATION    . VAGINAL HYSTERECTOMY      OB History    No data available       Home Medications    Prior to Admission medications   Medication Sig Start Date End Date Taking? Authorizing Provider  aspirin EC 81 MG tablet Take 81 mg by mouth daily.   Yes [provider]  azelastine (ASTELIN) 0.1 % nasal spray PLACE 2 SPRAYS INTO BOTH NOSTRILS 2 (TWO) TIMES DAILY AS NEEDED. Patient taking differently: PLACE 2 SPRAYS INTO BOTH NOSTRILS AT BEDTIME 11/20/15  Yes Hawks, Christy A, FNP  calcium citrate (CALCITRATE - DOSED IN MG ELEMENTAL CALCIUM) 950 MG tablet Take 1 tablet by mouth 2 (two) times daily.    Yes [provider]  cholecalciferol (VITAMIN D) 400 units TABS tablet Take 400 Units by mouth daily.   Yes [provider]  conjugated estrogens (PREMARIN) vaginal cream Place 0.5 g vaginally every Thursday. Use as  directed   Yes [provider]  cyanocobalamin 500 MCG tablet Take 500 mcg by mouth daily.    Yes [provider]  DEXILANT 60 MG capsule TAKE 1 CAPSULE (60 MG TOTAL) BY MOUTH DAILY. 05/14/16  Yes Hawks, Christy A, FNP  diazepam (VALIUM) 5 MG tablet TAKE 1 TABLET TWICE A DAY AS NEEDED FOR ANXIETY Patient taking differently: Take 5 mg by mouth 2 (two) times daily as needed for anxiety.  07/17/16  Yes Hawks, Christy A, FNP  EPINEPHrine 0.3 mg/0.3 mL IJ SOAJ injection Inject 0.3 mg into the muscle once as needed (For anaphylaxis.).   Yes [provider]  fluticasone (FLONASE) 50 MCG/ACT nasal spray PLACE 1  SPRAY INTO BOTH NOSTRILS 2 (TWO) TIMES DAILY AS NEEDED FOR ALLERGIES OR RHINITIS. Patient taking differently: Place 1 spray into both nostrils 2 (two) times daily.  06/16/16  Yes Hawks, Christy A, FNP  levocetirizine (XYZAL) 5 MG tablet Take 5 mg by mouth at bedtime.  07/16/15  Yes [provider]  levothyroxine (SYNTHROID) 100 MCG tablet Take 1 tablet (100 mcg total) by mouth daily before breakfast. 04/23/16  Yes Hawks, Christy A, FNP  metoprolol succinate (TOPROL-XL) 50 MG 24 hr tablet TAKE 1 TABLET (50 MG TOTAL) BY MOUTH DAILY. TAKE WITH OR IMMEDIATELY FOLLOWING A MEAL. 03/07/16  Yes Hawks, Christy A, FNP  mometasone (ELOCON) 0.1 % cream APPLY TO AFFECTED AREA DAILY 08/12/16  Yes Hawks, Christy A, FNP  ondansetron (ZOFRAN) 8 MG tablet Take 8 mg by mouth every 8 (eight) hours as needed for nausea or vomiting.   Yes [provider]  PATADAY 0.2 % SOLN Place 1 drop into both eyes daily. 06/10/12  Yes [provider]  polyethylene glycol powder (GLYCOLAX/MIRALAX) powder TAKE 17 G BY MOUTH 2 (TWO) TIMES DAILY AS NEEDED. 02/01/16  Yes Sharion Balloon, FNP  PROAIR HFA 108 (90 Base) MCG/ACT inhaler INHALE 2 PUFFS INTO THE LUNGS EVERY 6 (SIX) HOURS AS NEEDED FOR WHEEZING. 09/13/15  Yes Evelina Dun A, FNP  simvastatin (ZOCOR) 40 MG tablet TAKE 1 TABLET (40 MG TOTAL) BY MOUTH AT BEDTIME. 06/10/16  Yes Hawks, Christy A, FNP  traMADol (ULTRAM) 50 MG tablet TAKE 1 TABLET BY MOUTH EVERY 6 HOURS AS NEEDED Patient taking differently: TAKE 1 TABLET BY MOUTH EVERY 6 HOURS AS NEEDED FOR PAIN 12/03/15  Yes Hawks, Christy A, FNP  trimethoprim (TRIMPEX) 100 MG tablet Take 100 mg by mouth daily.   Yes [provider]  valsartan-hydrochlorothiazide (DIOVAN-HCT) 160-12.5 MG tablet TAKE 1 TABLET BY MOUTH ONCE EVERY DAY 05/20/16  Yes Hawks, Christy A, FNP  VIGAMOX 0.5 % ophthalmic solution Apply 1 drop to eye as needed (Instill into affected eye four times daily for 2 days after each eye  injection for macular degeneration.).  08/30/15  Yes [provider]  vitamin C (ASCORBIC ACID) 500 MG tablet Take 500 mg by mouth daily.   Yes [provider]  Vitamin D, Ergocalciferol, (DRISDOL) 50000 units CAPS capsule TAKE 1 CAPSULE (50,000 UNITS TOTAL) BY MOUTH EVERY 7 (SEVEN) DAYS. 07/07/16  Yes Hawks, Christy A, FNP  vitamin E 400 UNIT capsule Take 400 Units by mouth daily.   Yes [provider]  levofloxacin (LEVAQUIN) 500 MG tablet Take 1 tablet (500 mg total) by mouth daily. 09/23/16   Hawks, Alyse Low A, FNP  OVER THE COUNTER MEDICATION Place 1 drop into both eyes as needed (For dry eyes.). Systane Gel Drops    [provider]  Family History Family History  Problem Relation Age of Onset  . Diabetes Unknown   . Hypertension Unknown   . Coronary artery disease Unknown   . Stroke Unknown   . Asthma Unknown   . Kidney disease Sister   . Lung cancer Sister   . Lung cancer Sister   . Suicidality Son        committed suicide in 2009  . Hyperlipidemia Unknown   . Anxiety disorder Unknown   . Migraines Unknown     Social History Social History  Substance Use Topics  . Smoking status: Never Smoker  . Smokeless tobacco: Never Used  . Alcohol use No     Allergies   Allopurinol and Mobic [meloxicam]   Review of Systems Review of Systems ROS: Statement: All systems negative except as marked or noted in the HPI; Constitutional: Negative for fever and chills. ; ; Eyes: Negative for eye pain, redness and discharge. ; ; ENMT: Negative for ear pain, hoarseness, nasal congestion, sinus pressure and sore throat. ; ; Cardiovascular: Negative for chest pain, palpitations, diaphoresis, and peripheral edema. ; ; Respiratory: +cough, wheezing, SOB. Negative for stridor. ; ; Gastrointestinal: Negative for nausea, vomiting, diarrhea, abdominal pain, blood in stool, hematemesis, jaundice and rectal bleeding. . ; ; Genitourinary: Negative for dysuria, flank  pain and hematuria. ; ; Musculoskeletal: Negative for back pain and neck pain. Negative for swelling and trauma.; ; Skin: Negative for pruritus, rash, abrasions, blisters, bruising and skin lesion.; ; Neuro: Negative for headache, lightheadedness and neck stiffness. Negative for weakness, altered level of consciousness, altered mental status, extremity weakness, paresthesias, involuntary movement, seizure and syncope.       Physical Exam Updated Vital Signs BP (!) 166/66 (BP Location: Right Arm)   Pulse 71   Temp 98.6 F (37 C) (Oral)   Resp (!) 28   Ht 5\' 8"  (1.727 m)   Wt 108 kg (238 lb)   SpO2 97%   BMI 36.19 kg/m    Patient Vitals for the past 24 hrs:  BP Temp Temp src Pulse Resp SpO2 Height Weight  09/23/16 2030 (!) 155/74 - - 64 15 100 % - -  09/23/16 2028 - - - - - 98 % - -  09/23/16 2015 134/89 - - 65 17 98 % - -  09/23/16 1837 - - - - - 97 % - -  09/23/16 1834 (!) 166/66 98.6 F (37 C) Oral 71 (!) 28 97 % - -  09/23/16 1833 - - - - - - 5\' 8"  (1.727 m) 108 kg (238 lb)   09/23/16 1557 97.4 F (36.3 C) 63 -- 137/71  87 % sitting on room air -- 108.3 kg (238 lb 12.8 oz) JMS     Physical Exam 1940: Physical examination:  Nursing notes reviewed; Vital signs and O2 SAT reviewed;  Constitutional: Well developed, Well nourished, Well hydrated, In no acute distress; Head:  Normocephalic, atraumatic; Eyes: EOMI, PERRL, No scleral icterus; ENMT: Mouth and pharynx normal, Mucous membranes moist; Neck: Supple, Full range of motion, No lymphadenopathy; Cardiovascular: Regular rate and rhythm, No gallop; Respiratory: Breath sounds coarse & equal bilaterally, faint scattered wheezes. No audible wheezing. Speaking full sentences, Tachypneic, sitting upright.; Chest: Nontender, Movement normal; Abdomen: Soft, Nontender, Nondistended, Normal bowel sounds; Genitourinary: No CVA tenderness; Extremities: Pulses normal, No tenderness, No edema, No calf edema or asymmetry.; Neuro: AA&Ox3, Major CN  grossly intact.  Speech clear. No gross focal motor or sensory deficits in extremities.; Skin: Color  normal, Warm, Dry.   ED Treatments / Results  Labs (all labs ordered are listed, but only abnormal results are displayed)   EKG  EKG Interpretation  Date/Time:  Tuesday September 23 2016 20:15:01 EDT Ventricular Rate:  71 PR Interval:    QRS Duration: 101 QT Interval:  430 QTC Calculation: 471 R Axis:   65 Text Interpretation:  Sinus rhythm Supraventricular bigeminy Aberrant conduction of SV complex(es) Low voltage, precordial leads Baseline wander When compared with ECG of 03/01/2013 No significant change was found Confirmed by [facility]  MD, Nunzio Cory [phone]) on 09/23/2016 8:47:52 PM       Radiology   Procedures Procedures (including critical care time)  Medications Ordered in ED Medications  ipratropium-albuterol (DUONEB) 0.5-2.5 (3) MG/3ML nebulizer solution 3 mL (not administered)  albuterol (PROVENTIL) (2.5 MG/3ML) 0.083% nebulizer solution 2.5 mg (not administered)     Initial Impression / Assessment and Plan / ED Course  I have reviewed the triage vital signs and the nursing notes.  Pertinent labs & imaging results that were available during my care of the patient were reviewed by me and considered in my medical decision making (see chart for details).  MDM Reviewed: previous chart, nursing note and vitals Reviewed previous: labs, ECG and x-ray Interpretation: labs, ECG and CT scan Total time providing critical care: 30-74 minutes. This excludes time spent performing separately reportable procedures and services. Consults: admitting MD   CRITICAL CARE Performed by: Alfonzo Feller Total critical care time: 35 minutes Critical care time was exclusive of separately billable procedures and treating other patients. Critical care was necessary to treat or prevent imminent or life-threatening deterioration. Critical care was time spent personally by me on the following  activities: development of treatment plan with patient and/or surrogate as well as nursing, discussions with consultants, evaluation of patient's response to treatment, examination of patient, obtaining history from patient or surrogate, ordering and performing treatments and interventions, ordering and review of laboratory studies, ordering and review of radiographic studies, pulse oximetry and re-evaluation of patient's condition.    Results for orders placed or performed during the hospital encounter of 04/29/30  Basic metabolic panel  Result Value Ref Range   Sodium 141 135 - 145 mmol/L   Potassium 4.2 3.5 - 5.1 mmol/L   Chloride 99 (L) 101 - 111 mmol/L   CO2 33 (H) 22 - 32 mmol/L   Glucose, Bld 114 (H) 65 - 99 mg/dL   BUN 9 6 - 20 mg/dL   Creatinine, Ser 0.98 0.44 - 1.00 mg/dL   Calcium 10.1 8.9 - 10.3 mg/dL   GFR calc non Af Amer 55 (L) >60 mL/min   GFR calc Af Amer >60 >60 mL/min   Anion gap 9 5 - 15  Lactic acid, plasma  Result Value Ref Range   Lactic Acid, Venous 1.6 0.5 - 1.9 mmol/L  CBC with Differential  Result Value Ref Range   WBC 14.3 (H) 4.0 - 10.5 K/uL   RBC 4.70 3.87 - 5.11 MIL/uL   Hemoglobin 13.7 12.0 - 15.0 g/dL   HCT 43.6 36.0 - 46.0 %   MCV 92.8 78.0 - 100.0 fL   MCH 29.1 26.0 - 34.0 pg   MCHC 31.4 30.0 - 36.0 g/dL   RDW 15.0 11.5 - 15.5 %   Platelets 287 150 - 400 K/uL   Neutrophils Relative % 69 %   Neutro Abs 10.0 (H) 1.7 - 7.7 K/uL   Lymphocytes Relative 20 %   Lymphs Abs 2.8 0.7 -  4.0 K/uL   Monocytes Relative 9 %   Monocytes Absolute 1.2 (H) 0.1 - 1.0 K/uL   Eosinophils Relative 2 %   Eosinophils Absolute 0.3 0.0 - 0.7 K/uL   Basophils Relative 0 %   Basophils Absolute 0.0 0.0 - 0.1 K/uL  Brain natriuretic peptide  Result Value Ref Range   B Natriuretic Peptide 313.0 (H) 0.0 - 100.0 pg/mL  Troponin I  Result Value Ref Range   Troponin I <0.03 <0.03 ng/mL    Ct Angio Chest Pe W/cm &/or Wo Cm Result Date: 09/23/2016 CLINICAL DATA:  Cough,  shortness of breath for 1.5 months EXAM: CT ANGIOGRAPHY CHEST WITH CONTRAST TECHNIQUE: Multidetector CT imaging of the chest was performed using the standard protocol during bolus administration of intravenous contrast. Multiplanar CT image reconstructions and MIPs were obtained to evaluate the vascular anatomy. CONTRAST:  100 mL Isovue 370 COMPARISON:  11/17/2014 FINDINGS: Cardiovascular: Satisfactory opacification of the pulmonary arteries to the segmental level. No evidence of pulmonary embolism. Main pulmonary artery is dilated measuring 4.2 cm in diameter. Right main pulmonary artery is dilated measuring 3 cm. Normal heart size. No pericardial effusion. Thoracic aorta is normal in caliber. No thoracic aortic dissection. Thoracic aortic atherosclerosis. Mediastinum/Nodes: Right lower paratracheal lymph node measures 13 mm in short axis. Mild right hilar lymphadenopathy with the largest measuring 10 mm in short axis. Thyroid gland, trachea, and esophagus demonstrate no significant findings. Lungs/Pleura: 6 mm right lower lobe pulmonary nodule. Bilateral lower lobe mild interstitial thickening consistent with mild fibrosis which has progressed compared with 11/17/2014. No focal consolidation, pleural effusion or pneumothorax. Upper Abdomen: No acute abnormality. Musculoskeletal: No acute osseous abnormality. No lytic or sclerotic osseous lesion. Review of the MIP images confirms the above findings. IMPRESSION: 1. No pulmonary artery embolus. 2. Dilatation of the main pulmonary artery as can be seen with pulmonary arterial hypertension. 3. Stable 6 mm right lower lobe pulmonary nodule unchanged compared with 11/17/2014. Electronically Signed   By: Kathreen Devoid   On: 09/23/2016 21:22    Dg Chest 2 View Result Date: 09/23/2016 CLINICAL DATA:  Shortness of breath with productive cough EXAM: CHEST  2 VIEW COMPARISON:  CT 11/17/2014, radiograph 11/02/2013 FINDINGS: Hyperinflation. Streaky atelectasis or infiltrate at  the left base. No pleural effusion. Stable cardiomediastinal silhouette with atherosclerosis. No pneumothorax. IMPRESSION: Mild streaky atelectasis or infiltrate at the left base. Electronically Signed   By: Donavan Foil M.D.   On: 09/23/2016 16:24    2145:   On arrival: pt sitting upright, tachypneic, Sats 87 % R/A, lungs diminished with scattered wheezing. Pt denies hx COPD or asthma. Short neb given. After neb: pt appears more comfortable at rest, less tachypneic, Sats 97 % on O2 2L N/C, lungs continue diminished. Pt ambulated in hallway: pt's O2 Sats dropped to 75 % R/A with pt c/o increasing SOB, with increasing HR and RR. Pt escorted back to stretcher with O2 Sats slowly increasing to low 90's % on O2 2L N/C. Will dose hour long neb, admit.  T/C to [facility] Denton Brick, case discussed, including:  HPI, pertinent PM/SHx, VS/PE, dx testing, ED course and treatment:  Agreeable to admit.     Final Clinical Impressions(s) / ED Diagnoses   Final diagnoses:  None    New Prescriptions New Prescriptions   No medications on file     Francine Graven, DO 09/26/16 1424

## 2016-09-23 NOTE — ED Notes (Addendum)
Pt removed from [facility].  Pt Spo2 decreased to 87% on RA while resting.  Pt then sat up and spo2 decreased to 84%.  Pt was not able to tolerate ambulationout of the room as she stated that she became "weak and wobbly".  Pt desat to 75% on RA by the time she walked back to the bed.  Pt placed back on 2L [place].  Pt became tachypneic.

## 2016-09-23 NOTE — ED Notes (Signed)
Call to Respiratory Therapy to let them know of the pending breathing treatment.

## 2016-09-23 NOTE — Progress Notes (Signed)
   Subjective:    Patient ID: Lorraine Leonard, female    DOB: 1941/03/09, 76 y.o.   MRN: [id number]  Cough  This is a recurrent problem. The current episode started 1 to 4 weeks ago. The problem has been waxing and waning. The problem occurs constantly. The cough is productive of sputum. Associated symptoms include chills, a fever, headaches, myalgias, nasal congestion, postnasal drip, shortness of breath, weight loss and wheezing. Pertinent negatives include no ear congestion or ear pain. The symptoms are aggravated by lying down. She has tried OTC cough suppressant and rest for the symptoms. The treatment provided mild relief. There is no history of asthma or COPD.      Review of Systems  Constitutional: Positive for chills, fever and weight loss.  HENT: Positive for postnasal drip. Negative for ear pain.   Respiratory: Positive for cough, shortness of breath and wheezing.   Musculoskeletal: Positive for myalgias.  Neurological: Positive for headaches.  All other systems reviewed and are negative.      Objective:   Physical Exam  Constitutional: She is oriented to person, place, and time. She appears well-developed and well-nourished. She has a sickly appearance. She appears ill. No distress.  HENT:  Head: Normocephalic.  Eyes: Pupils are equal, round, and reactive to light.  Neck: Normal range of motion. Neck supple. No thyromegaly present.  Cardiovascular: Normal rate, regular rhythm, normal heart sounds and intact distal pulses.   No murmur heard. Pulmonary/Chest: Effort normal. No respiratory distress. She has decreased breath sounds. She has no wheezes.  Abdominal: Soft. Bowel sounds are normal. She exhibits no distension. There is no tenderness.  Musculoskeletal: Normal range of motion. She exhibits no edema or tenderness.  Neurological: She is alert and oriented to person, place, and time.  Skin: Skin is warm and dry.  Psychiatric: She has a normal mood and affect. Her  behavior is normal. Judgment and thought content normal.  Vitals reviewed.   Chest x-ray- CAP Preliminary reading by Evelina Dun, FNP [facility]   BP 137/71   Pulse 63   Temp 97.4 F (36.3 C) (Oral)   Ht 5\' 8"  (1.727 m)   Wt 238 lb 12.8 oz (108.3 kg)   SpO2 (!) 87% Comment: sitting on room air  BMI 36.31 kg/m      Assessment & Plan:  1. SOB (shortness of breath) - DG Chest 2 View; Future  2. Cough - DG Chest 2 View; Future  3. Community acquired pneumonia of left lung, unspecified part of lung - levofloxacin (LEVAQUIN) 500 MG tablet; Take 1 tablet (500 mg total) by mouth daily.  Dispense: 7 tablet; Refill: 0 - predniSONE (STERAPRED UNI-PAK 21 TAB) 10 MG (21) TBPK tablet; Use as directed  Dispense: 21 tablet; Refill: 0 - cefTRIAXone (ROCEPHIN) injection 500 mg; Inject 0.5 g (500 mg total) into the muscle once.   After talking to pt we will d/c all orders and will send pt to ED. PT is extremely SOB and O2 while sitting is 87%.  Evelina Dun, FNP

## 2016-09-23 NOTE — ED Triage Notes (Addendum)
Pt reports she has been coughing for 1 1/2 month. Pt seen by GI today and was told to come to ED due to cough. Pt reports SOB for "a long time". Coughing up white thick mucous. Pt had CXR today

## 2016-09-24 ENCOUNTER — Encounter (HOSPITAL_COMMUNITY): Payer: Self-pay | Admitting: *Deleted

## 2016-09-24 ENCOUNTER — Inpatient Hospital Stay (HOSPITAL_COMMUNITY): Payer: Medicare Other

## 2016-09-24 DIAGNOSIS — I5030 Unspecified diastolic (congestive) heart failure: Secondary | ICD-10-CM

## 2016-09-24 DIAGNOSIS — J8410 Pulmonary fibrosis, unspecified: Secondary | ICD-10-CM

## 2016-09-24 DIAGNOSIS — I272 Pulmonary hypertension, unspecified: Secondary | ICD-10-CM

## 2016-09-24 DIAGNOSIS — Z905 Acquired absence of kidney: Secondary | ICD-10-CM

## 2016-09-24 DIAGNOSIS — I10 Essential (primary) hypertension: Secondary | ICD-10-CM

## 2016-09-24 DIAGNOSIS — J9601 Acute respiratory failure with hypoxia: Principal | ICD-10-CM

## 2016-09-24 LAB — GLUCOSE, CAPILLARY: Glucose-Capillary: 134 mg/dL — ABNORMAL HIGH (ref 65–99)

## 2016-09-24 LAB — TROPONIN I
Troponin I: <0.03 ng/mL (ref <0.03)
Troponin I: <0.03 ng/mL (ref <0.03)

## 2016-09-24 MED ORDER — IPRATROPIUM-ALBUTEROL 0.5-2.5 (3) MG/3ML IN SOLN
3.0000 mL | Freq: Three times a day (TID) | RESPIRATORY_TRACT | Status: DC
  Administered 2016-09-25 (×2): 3 mL via RESPIRATORY_TRACT
  Filled 2016-09-24 (×2): qty 3

## 2016-09-24 MED ORDER — GATIFLOXACIN 0.5 % OP SOLN
OPHTHALMIC | Status: AC
  Filled 2016-09-24: qty 2.5

## 2016-09-24 MED ORDER — CALCIUM CARBONATE 1250 (500 CA) MG PO TABS
1.0000 | ORAL_TABLET | Freq: Two times a day (BID) | ORAL | Status: DC
  Administered 2016-09-24 – 2016-09-25 (×3): 500 mg via ORAL
  Filled 2016-09-24 (×3): qty 1

## 2016-09-24 NOTE — Progress Notes (Addendum)
PROGRESS NOTE  Lorraine Leonard QJJ:[id number] DOB: 1941/04/05 DOA: 09/23/2016 PCP: Sharion Balloon, FNP  Brief History:  76 year old female with a history of hypertension, impaired glucose tolerance, hypothyroidism, hyperlipidemia presents with a 4 month history of worsening shortness of breath and dyspnea on exertion. The patient complains of a chronic cough during this period of time producing white sputum. She denies any hemoptysis, nausea, vomiting, diarrhea. She denies any fevers, chills, abdominal pain, dysuria, hematuria. She has not been started on any new medications. She denies any worsening lower extremity edema or increasing abdominal girth.  The patient denies any recent travels or occupational exposures in the past. The patient had previously seen pulmonary medicine, Dr. Baltazar Apo secondary to interstitial and nodular changes on CT in the left lower lobe. She had continue to follow Dr. Kyung Rudd on an annual basis for her interstitial lung disease and history of rheumatoid arthritis. There was a question whether the patient actually had rheumatoid arthritis. She followed up with rheumatology, Dr. Gavin Pound. Per the patient, she was told that "more or less, I did not have rheumatoid arthritis".  Nevertheless, the patient was noted to be hypoxic when she saw her primary care provider on 09/23/2016 with oxygen saturation down into the mid 70s with minimal activity. As result, the patient was sent to the emergency department for further evaluation.  Assessment/Plan: Acute respiratory failure with hypoxia -Secondary to pulmonary fibrosis and interstitial lung disease -Last saw Dr. Baltazar Apo 11/09/15 -it appears her ILD has progressed in past 10 months -stable on 2L -appreciate pulmonary consult--Dr. Luan Pulling -repeat outpatient PFTs -09/23/2016 CT angiogram chest--bilateral lower lobe interstitial thickening with mild fibrosis progressed since CT 11/17/2014; dilated pulmonary  artery -Continue bronchodilators  Pulmonary HTN -suspected PAH  -based on the size of her pulmonary artery on CT scan of the chest -11/23/2015 echo--EF 65-70%, normal PASP -follow up repeat echo  Atypical chest pain -personally reviewed EKG--sinus with PACs and nonconducted atrial beats, nonspecific T-wave changes -personally reviewed CXR--bibasilar atelectasis -cycle troponins  Essential hypertension -Controlled on metoprolol succinate  Hyperlipidemia -Continue statin  Hypothyroidism -Continue Synthroid   Disposition Plan:   Home when cleared by pulmonary medicine Family Communication:   Spouse updated at bedside--Total time spent 35 minutes.  Greater than 50% spent face to face counseling and coordinating care.  Consultants:  Pulmonary--Dr. Luan Pulling  Code Status:  FULL   DVT Prophylaxis:   Heparin   Procedures: As Listed in Progress Note Above  Antibiotics: None    Subjective: Patient states that she is breathing a little better with supplemental oxygen but still remained short of breath with minimal exertion. Denies any fevers, chest, chest pain, nausea, vomiting, diarrhea, abdominal pain, dysuria, hematuria. Denies any headache, neck pain, visual disturbance. No hemoptysis.  Objective: Vitals:   09/24/16 0638 09/24/16 0718 09/24/16 1316 09/24/16 1421  BP: 127/64  (!) 119/49   Pulse: 61  71   Resp: 18  20   Temp: 98.2 F (36.8 C)  97.8 F (36.6 C)   TempSrc: Axillary  Oral   SpO2: 98% 94% 95% 93%  Weight:      Height:        Intake/Output Summary (Last 24 hours) at 09/24/16 1523 Last data filed at 09/24/16 1317  Gross per 24 hour  Intake              750 ml  Output  1150 ml  Net             -400 ml   Weight change:  Exam:   General:  Pt is alert, follows commands appropriately, not in acute distress  HEENT: No icterus, No thrush, No neck mass, [place]/AT  Cardiovascular: RRR, S1/S2, no rubs, no gallops; no JVD  Respiratory:  Bibasilar crackles and no wheezing. Good air movement.  Abdomen: Soft/+BS, non tender, non distended, no guarding  Extremities: No edema, No lymphangitis, No petechiae, No rashes, no synovitis   Data Reviewed: I have personally reviewed following labs and imaging studies Basic Metabolic Panel:  Recent Labs Lab 09/23/16 1955  NA 141  K 4.2  CL 99*  CO2 33*  GLUCOSE 114*  BUN 9  CREATININE 0.98  CALCIUM 10.1   Liver Function Tests: No results for input(s): AST, ALT, ALKPHOS, BILITOT, PROT, ALBUMIN in the last 168 hours. No results for input(s): LIPASE, AMYLASE in the last 168 hours. No results for input(s): AMMONIA in the last 168 hours. Coagulation Profile: No results for input(s): INR, PROTIME in the last 168 hours. CBC:  Recent Labs Lab 09/23/16 1955  WBC 14.3*  NEUTROABS 10.0*  HGB 13.7  HCT 43.6  MCV 92.8  PLT 287   Cardiac Enzymes:  Recent Labs Lab 09/23/16 1955  TROPONINI <0.03   BNP: Invalid input(s): POCBNP CBG: No results for input(s): GLUCAP in the last 168 hours. HbA1C: No results for input(s): HGBA1C in the last 72 hours. Urine analysis:    Component Value Date/Time   COLORURINE YELLOW 05/14/2011 1942   APPEARANCEUR CLEAR 05/14/2011 1942   LABSPEC 1.009 05/14/2011 1942   PHURINE 6.0 05/14/2011 1942   GLUCOSEU NEGATIVE 05/14/2011 1942   HGBUR TRACE (A) 05/14/2011 1942   BILIRUBINUR neg 04/06/2015 1517   KETONESUR NEGATIVE 05/14/2011 1942   PROTEINUR neg 04/06/2015 1517   PROTEINUR NEGATIVE 05/14/2011 1942   UROBILINOGEN negative 04/06/2015 1517   UROBILINOGEN 0.2 05/14/2011 1942   NITRITE neg 04/06/2015 1517   NITRITE NEGATIVE 05/14/2011 1942   LEUKOCYTESUR large (3+) (A) 04/06/2015 1517   Sepsis Labs: @LABRCNTIP (procalcitonin:4,lacticidven:4) ) Recent Results (from the past 240 hour(s))  Culture, blood (Routine X 2) w Reflex to ID Panel     Status: None (Preliminary result)   Collection Time: 09/23/16 10:01 PM  Result Value Ref  Range Status   Specimen Description BLOOD LEFT FOREARM  Final   Special Requests Blood Culture adequate volume  Final   Culture NO GROWTH < 12 HOURS  Final   Report Status PENDING  Incomplete  Culture, blood (Routine X 2) w Reflex to ID Panel     Status: None (Preliminary result)   Collection Time: 09/23/16 10:11 PM  Result Value Ref Range Status   Specimen Description BLOOD RIGHT HAND  Final   Special Requests   Final    Blood Culture results may not be optimal due to an inadequate volume of blood received in culture bottles   Culture NO GROWTH < 12 HOURS  Final   Report Status PENDING  Incomplete     Scheduled Meds: . aspirin EC  81 mg Oral Daily  . calcium carbonate  1 tablet Oral BID WC  . cholecalciferol  400 Units Oral Daily  . [START ON 09/25/2016] conjugated estrogens  0.5 g Vaginal Q Thu  . gatifloxacin  1 drop Both Eyes QID  . guaiFENesin  600 mg Oral BID  . heparin  5,000 Units Subcutaneous Q8H  . ipratropium-albuterol  3  mL Nebulization Q6H  . levothyroxine  100 mcg Oral QAC breakfast  . metoprolol succinate  50 mg Oral Daily  . pantoprazole  40 mg Oral Daily  . senna  1 tablet Oral BID  . simvastatin  40 mg Oral q1800  . sodium chloride flush  3 mL Intravenous Q12H  . trimethoprim  100 mg Oral Daily  . vitamin B-12  500 mcg Oral Daily  . vitamin C  500 mg Oral Daily  . Vitamin D (Ergocalciferol)  50,000 Units Oral Q7 days  . vitamin E  400 Units Oral Daily   Continuous Infusions: . sodium chloride    . levofloxacin (LEVAQUIN) IV Stopped (09/24/16 0050)    Procedures/Studies: Dg Chest 2 View  Result Date: 09/23/2016 CLINICAL DATA:  Shortness of breath with productive cough EXAM: CHEST  2 VIEW COMPARISON:  CT 11/17/2014, radiograph 11/02/2013 FINDINGS: Hyperinflation. Streaky atelectasis or infiltrate at the left base. No pleural effusion. Stable cardiomediastinal silhouette with atherosclerosis. No pneumothorax. IMPRESSION: Mild streaky atelectasis or infiltrate  at the left base. Electronically Signed   By: Donavan Foil M.D.   On: 09/23/2016 16:24   Ct Angio Chest Pe W/cm &/or Wo Cm  Result Date: 09/23/2016 CLINICAL DATA:  Cough, shortness of breath for 1.5 months EXAM: CT ANGIOGRAPHY CHEST WITH CONTRAST TECHNIQUE: Multidetector CT imaging of the chest was performed using the standard protocol during bolus administration of intravenous contrast. Multiplanar CT image reconstructions and MIPs were obtained to evaluate the vascular anatomy. CONTRAST:  100 mL Isovue 370 COMPARISON:  11/17/2014 FINDINGS: Cardiovascular: Satisfactory opacification of the pulmonary arteries to the segmental level. No evidence of pulmonary embolism. Main pulmonary artery is dilated measuring 4.2 cm in diameter. Right main pulmonary artery is dilated measuring 3 cm. Normal heart size. No pericardial effusion. Thoracic aorta is normal in caliber. No thoracic aortic dissection. Thoracic aortic atherosclerosis. Mediastinum/Nodes: Right lower paratracheal lymph node measures 13 mm in short axis. Mild right hilar lymphadenopathy with the largest measuring 10 mm in short axis. Thyroid gland, trachea, and esophagus demonstrate no significant findings. Lungs/Pleura: 6 mm right lower lobe pulmonary nodule. Bilateral lower lobe mild interstitial thickening consistent with mild fibrosis which has progressed compared with 11/17/2014. No focal consolidation, pleural effusion or pneumothorax. Upper Abdomen: No acute abnormality. Musculoskeletal: No acute osseous abnormality. No lytic or sclerotic osseous lesion. Review of the MIP images confirms the above findings. IMPRESSION: 1. No pulmonary artery embolus. 2. Dilatation of the main pulmonary artery as can be seen with pulmonary arterial hypertension. 3. Stable 6 mm right lower lobe pulmonary nodule unchanged compared with 11/17/2014. Electronically Signed   By: Kathreen Devoid   On: 09/23/2016 21:22    Abdulrahim Siddiqi, DO  [facility] Pager  [phone]  If 7PM-7AM, please contact night-coverage [email or web address] Password [email or web address] 09/24/2016, 3:23 PM   LOS: 1 day

## 2016-09-24 NOTE — Progress Notes (Signed)
Patient does not wear CPAP at night. Her PCO2 was slightly elevated will continue to monitor and hold CPAP for now.

## 2016-09-24 NOTE — Progress Notes (Signed)
*  PRELIMINARY RESULTS* Echocardiogram 2D Echocardiogram has been performed.  Leavy Cella 09/24/2016, 3:27 PM

## 2016-09-24 NOTE — Progress Notes (Addendum)
Paged the MD about patients request for valium vs the trazadone she is ordered.  She states that the trazadone is not working for her.  Late Entry for 09/24/2016 1830 approx:  MD returned the call and stated that the patient can not have Valium that she has to continue her trazodone. Discussed with the patient and she verbalized understanding.

## 2016-09-24 NOTE — Progress Notes (Signed)
Patient doesn't wear CPAP at home and doesn't wish to wear one here. No unit in room at this time.

## 2016-09-24 NOTE — Progress Notes (Signed)
Patient still up on bedside cough, Saturation 91 on 2lpm , oxygen increased to 3lpm

## 2016-09-24 NOTE — Care Management Note (Signed)
Case Management Note  Patient Details  Name: Lorraine Leonard MRN: [id number] Date of Birth: 1940-11-06  Subjective/Objective:                  Adm with acute respiratory failure with hypoxia. From home with husband, ind PTA. Has RW if needed. No HH or oxygen PTA.  Action/Plan: Plans to return home with self care. CM following for needs. ? Oxygen/HH needs.   Expected Discharge Date:  09/24/16               Expected Discharge Plan:  Home/Self Care  In-House Referral:     Discharge planning Services  CM Consult  Post Acute Care Choice:    Choice offered to:     DME Arranged:    DME Agency:     HH Arranged:    HH Agency:     Status of Service:  In process, will continue to follow  If discussed at Long Length of Stay Meetings, dates discussed:    Additional Comments:  Jeral Zick, Chauncey Reading, RN 09/24/2016, 2:14 PM

## 2016-09-24 NOTE — Consult Note (Signed)
Consult requested by: [facility], Dr.Tat Consult requested for: Respiratory failure possible pulmonary hypertension interstitial lung disease  HPI: This is a 76 year old who says that she's been very tired for several months. She eventually came to the emergency department where she was found to be markedly hypoxic. She had CT of the chest looking for pulmonary emboli and that showed possible pulmonary hypertension and some interstitial lung changes. These have progressed from a CT done in 2016. She says she feels okay. She is tired. She is mildly short of breath. She is coughing a little bit. She has never smoked cigarettes. She says that she was told that she had rheumatoid arthritis and then she went to a rheumatologist and was told that if she had it was mild. She doesn't have a lot of complaints about her breathing. She denies nausea vomiting diarrhea abdominal pain she does have a little bit of chest pain that she describes as associated with taking a breath. No hemoptysis. No sputum production.  Past Medical History:  Diagnosis Date  . Allergic rhinitis   . Anxiety   . Anxiety   . Depression   . Diabetes mellitus    pre-diabetic  . Diverticulosis   . GERD (gastroesophageal reflux disease)   . Hyperlipidemia   . Hypertension   . Hypothyroidism   . 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 Macular degeneration   . Rheumatoid arthritis(714.0)   . Urticaria      Family History  Problem Relation Age of Onset  . Diabetes Unknown   . Hypertension Unknown   . Coronary artery disease Unknown   . Stroke Unknown   . Asthma Unknown   . Kidney disease Sister   . Lung cancer Sister   . Lung cancer Sister   . Suicidality Son        committed suicide in 2009  . Hyperlipidemia Unknown   . Anxiety disorder Unknown   . Migraines Unknown      Social History   Social History  . Marital status: Married    Spouse name: N/A  . Number of children: 4  . Years of education: N/A   Occupational  History  . retired     [employer]   Social History Main Topics  . Smoking status: Never Smoker  . Smokeless tobacco: Never Used  . Alcohol use No  . Drug use: No  . Sexual activity: Not Asked   Other Topics Concern  . 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     ROS: Except as mentioned 10 point review of systems is negative    Objective: Vital signs in last 24 hours: Temp:  [97.4 F (36.3 C)-98.6 F (37 C)] 98.2 F (36.8 C) (06/06 4854) Pulse Rate:  [61-84] 61 (06/06 0638) Resp:  [15-32] 18 (06/06 0638) BP: (114-166)/(57-89) 127/64 (06/06 0638) SpO2:  [75 %-100 %] 94 % (06/06 0718) Weight:  [108 kg (238 lb)-108.3 kg (238 lb 12.8 oz)] 108 kg (238 lb) (06/05 2345) Weight change:  Last BM Date: 09/23/16  Intake/Output from previous day: 06/05 0701 - 06/06 0700 In: 270 [P.O.:120; IV Piggyback:150] Out: 700 [Urine:700]  PHYSICAL EXAM Constitutional: She is awake and alert and in no acute distress. She is somewhat obese. Eyes: Pupils react EOMI. Ears nose mouth and throat: Her mucous membranes are moist. Her hearing is grossly normal. Cardiovascular: Her heart is regular with normal heart sounds no edema.  Respiratory: She does have some crackles in the bases bilaterally. Respiratory effort is normal. She is wearing nasal oxygen. Gastrointestinal: Her abdomen is soft with no masses. Skin: Warm and dry. Musculoskeletal: She doesn't have any definite changes of rheumatoid in her hands. Neurological: No focal abnormalities. Psychiatric: Normal mood and affect  Lab Results: Basic Metabolic Panel:  Recent Labs  09/23/16 1955  NA 141  K 4.2  CL 99*  CO2 33*  GLUCOSE 114*  BUN 9  CREATININE 0.98  CALCIUM 10.1   Liver Function Tests: No results for input(s): AST, ALT, ALKPHOS, BILITOT, PROT, ALBUMIN in the last 72 hours. No results for input(s): LIPASE, AMYLASE in the last 72 hours. No results for input(s): AMMONIA  in the last 72 hours. CBC:  Recent Labs  09/23/16 1955  WBC 14.3*  NEUTROABS 10.0*  HGB 13.7  HCT 43.6  MCV 92.8  PLT 287   Cardiac Enzymes:  Recent Labs  09/23/16 1955  TROPONINI <0.03   BNP: No results for input(s): PROBNP in the last 72 hours. D-Dimer: No results for input(s): DDIMER in the last 72 hours. CBG: No results for input(s): GLUCAP in the last 72 hours. Hemoglobin A1C: No results for input(s): HGBA1C in the last 72 hours. Fasting Lipid Panel: No results for input(s): CHOL, HDL, LDLCALC, TRIG, CHOLHDL, LDLDIRECT in the last 72 hours. Thyroid Function Tests: No results for input(s): TSH, T4TOTAL, FREET4, T3FREE, THYROIDAB in the last 72 hours. Anemia Panel: No results for input(s): VITAMINB12, FOLATE, FERRITIN, TIBC, IRON, RETICCTPCT in the last 72 hours. Coagulation: No results for input(s): LABPROT, INR in the last 72 hours. Urine Drug Screen: Drugs of Abuse  No results found for: LABOPIA, COCAINSCRNUR, LABBENZ, AMPHETMU, THCU, LABBARB  Alcohol Level: No results for input(s): ETH in the last 72 hours. Urinalysis: No results for input(s): COLORURINE, LABSPEC, PHURINE, GLUCOSEU, HGBUR, BILIRUBINUR, KETONESUR, PROTEINUR, UROBILINOGEN, NITRITE, LEUKOCYTESUR in the last 72 hours.  Invalid input(s): APPERANCEUR Misc. Labs:   ABGS:  Recent Labs  09/23/16 2250  PHART 7.412  PO2ART 65.1*  HCO3 28.8*     MICROBIOLOGY: Recent Results (from the past 240 hour(s))  Culture, blood (Routine X 2) w Reflex to ID Panel     Status: None (Preliminary result)   Collection Time: 09/23/16 10:01 PM  Result Value Ref Range Status   Specimen Description BLOOD LEFT FOREARM  Final   Special Requests Blood Culture adequate volume  Final   Culture NO GROWTH < 12 HOURS  Final   Report Status PENDING  Incomplete  Culture, blood (Routine X 2) w Reflex to ID Panel     Status: None (Preliminary result)   Collection Time: 09/23/16 10:11 PM  Result Value Ref Range Status    Specimen Description BLOOD RIGHT HAND  Final   Special Requests   Final    Blood Culture results may not be optimal due to an inadequate volume of blood received in culture bottles   Culture NO GROWTH < 12 HOURS  Final   Report Status PENDING  Incomplete    Studies/Results: Dg Chest 2 View  Result Date: 09/23/2016 CLINICAL DATA:  Shortness of breath with productive cough EXAM: CHEST  2 VIEW COMPARISON:  CT 11/17/2014, radiograph 11/02/2013 FINDINGS: Hyperinflation. Streaky atelectasis or infiltrate at the left base. No pleural effusion. Stable cardiomediastinal silhouette with atherosclerosis. No pneumothorax. IMPRESSION: Mild streaky atelectasis or infiltrate at the left base. Electronically Signed   By: Donavan Foil M.D.   On: 09/23/2016 16:24   Ct  Angio Chest Pe W/cm &/or Wo Cm  Result Date: 09/23/2016 CLINICAL DATA:  Cough, shortness of breath for 1.5 months EXAM: CT ANGIOGRAPHY CHEST WITH CONTRAST TECHNIQUE: Multidetector CT imaging of the chest was performed using the standard protocol during bolus administration of intravenous contrast. Multiplanar CT image reconstructions and MIPs were obtained to evaluate the vascular anatomy. CONTRAST:  100 mL Isovue 370 COMPARISON:  11/17/2014 FINDINGS: Cardiovascular: Satisfactory opacification of the pulmonary arteries to the segmental level. No evidence of pulmonary embolism. Main pulmonary artery is dilated measuring 4.2 cm in diameter. Right main pulmonary artery is dilated measuring 3 cm. Normal heart size. No pericardial effusion. Thoracic aorta is normal in caliber. No thoracic aortic dissection. Thoracic aortic atherosclerosis. Mediastinum/Nodes: Right lower paratracheal lymph node measures 13 mm in short axis. Mild right hilar lymphadenopathy with the largest measuring 10 mm in short axis. Thyroid gland, trachea, and esophagus demonstrate no significant findings. Lungs/Pleura: 6 mm right lower lobe pulmonary nodule. Bilateral lower lobe mild  interstitial thickening consistent with mild fibrosis which has progressed compared with 11/17/2014. No focal consolidation, pleural effusion or pneumothorax. Upper Abdomen: No acute abnormality. Musculoskeletal: No acute osseous abnormality. No lytic or sclerotic osseous lesion. Review of the MIP images confirms the above findings. IMPRESSION: 1. No pulmonary artery embolus. 2. Dilatation of the main pulmonary artery as can be seen with pulmonary arterial hypertension. 3. Stable 6 mm right lower lobe pulmonary nodule unchanged compared with 11/17/2014. Electronically Signed   By: Kathreen Devoid   On: 09/23/2016 21:22    Medications:  Prior to Admission:  Prescriptions Prior to Admission  Medication Sig Dispense Refill Last Dose  . aspirin EC 81 MG tablet Take 81 mg by mouth daily.   09/23/2016 at Unknown time  . azelastine (ASTELIN) 0.1 % nasal spray PLACE 2 SPRAYS INTO BOTH NOSTRILS 2 (TWO) TIMES DAILY AS NEEDED. (Patient taking differently: PLACE 2 SPRAYS INTO BOTH NOSTRILS AT BEDTIME) 30 mL 4 09/22/2016 at Unknown time  . calcium citrate (CALCITRATE - DOSED IN MG ELEMENTAL CALCIUM) 950 MG tablet Take 1 tablet by mouth 2 (two) times daily.    09/23/2016 at Unknown time  . cholecalciferol (VITAMIN D) 400 units TABS tablet Take 400 Units by mouth daily.   09/23/2016 at Unknown time  . conjugated estrogens (PREMARIN) vaginal cream Place 0.5 g vaginally every Thursday. Use as directed   09/18/2016 at Unknown time  . cyanocobalamin 500 MCG tablet Take 500 mcg by mouth daily.    09/23/2016 at Unknown time  . DEXILANT 60 MG capsule TAKE 1 CAPSULE (60 MG TOTAL) BY MOUTH DAILY. 30 capsule 5 09/23/2016 at Unknown time  . diazepam (VALIUM) 5 MG tablet TAKE 1 TABLET TWICE A DAY AS NEEDED FOR ANXIETY (Patient taking differently: Take 5 mg by mouth 2 (two) times daily as needed for anxiety. ) 60 tablet 5 09/22/2016 at Unknown time  . EPINEPHrine 0.3 mg/0.3 mL IJ SOAJ injection Inject 0.3 mg into the muscle once as needed (For  anaphylaxis.).   UNKNOWN  . fluticasone (FLONASE) 50 MCG/ACT nasal spray PLACE 1 SPRAY INTO BOTH NOSTRILS 2 (TWO) TIMES DAILY AS NEEDED FOR ALLERGIES OR RHINITIS. (Patient taking differently: Place 1 spray into both nostrils 2 (two) times daily. ) 16 g 5 09/23/2016 at Unknown time  . levocetirizine (XYZAL) 5 MG tablet Take 5 mg by mouth at bedtime.    09/22/2016 at Unknown time  . levothyroxine (SYNTHROID) 100 MCG tablet Take 1 tablet (100 mcg total) by mouth daily before  breakfast. 90 tablet 1 09/23/2016 at Unknown time  . metoprolol succinate (TOPROL-XL) 50 MG 24 hr tablet TAKE 1 TABLET (50 MG TOTAL) BY MOUTH DAILY. TAKE WITH OR IMMEDIATELY FOLLOWING A MEAL. 90 tablet 3 09/22/2016 at 1800  . mometasone (ELOCON) 0.1 % cream APPLY TO AFFECTED AREA DAILY 45 g 2 Past Week at Unknown time  . ondansetron (ZOFRAN) 8 MG tablet Take 8 mg by mouth every 8 (eight) hours as needed for nausea or vomiting.   09/22/2016 at Unknown time  . PATADAY 0.2 % SOLN Place 1 drop into both eyes daily.   09/23/2016 at Unknown time  . polyethylene glycol powder (GLYCOLAX/MIRALAX) powder TAKE 17 G BY MOUTH 2 (TWO) TIMES DAILY AS NEEDED. 3162 g 0 Past Week at Unknown time  . PROAIR HFA 108 (90 Base) MCG/ACT inhaler INHALE 2 PUFFS INTO THE LUNGS EVERY 6 (SIX) HOURS AS NEEDED FOR WHEEZING. 8.5 Inhaler 1 09/23/2016 at Unknown time  . simvastatin (ZOCOR) 40 MG tablet TAKE 1 TABLET (40 MG TOTAL) BY MOUTH AT BEDTIME. 30 tablet 3 09/22/2016 at Unknown time  . traMADol (ULTRAM) 50 MG tablet TAKE 1 TABLET BY MOUTH EVERY 6 HOURS AS NEEDED (Patient taking differently: TAKE 1 TABLET BY MOUTH EVERY 6 HOURS AS NEEDED FOR PAIN) 90 tablet 2 Past Week at Unknown time  . trimethoprim (TRIMPEX) 100 MG tablet Take 100 mg by mouth daily.   09/23/2016 at Unknown time  . valsartan-hydrochlorothiazide (DIOVAN-HCT) 160-12.5 MG tablet TAKE 1 TABLET BY MOUTH ONCE EVERY DAY 30 tablet 4 09/23/2016 at Unknown time  . VIGAMOX 0.5 % ophthalmic solution Apply 1 drop to eye as  needed (Instill into affected eye four times daily for 2 days after each eye injection for macular degeneration.).   12 Taking  . vitamin C (ASCORBIC ACID) 500 MG tablet Take 500 mg by mouth daily.   09/23/2016 at Unknown time  . Vitamin D, Ergocalciferol, (DRISDOL) 50000 units CAPS capsule TAKE 1 CAPSULE (50,000 UNITS TOTAL) BY MOUTH EVERY 7 (SEVEN) DAYS. 12 capsule 0 Past Week at Unknown time  . vitamin E 400 UNIT capsule Take 400 Units by mouth daily.   09/23/2016 at Unknown time  . levofloxacin (LEVAQUIN) 500 MG tablet Take 1 tablet (500 mg total) by mouth daily. 7 tablet 0   . OVER THE COUNTER MEDICATION Place 1 drop into both eyes as needed (For dry eyes.). Systane Gel Drops   Taking   Scheduled: . aspirin EC  81 mg Oral Daily  . calcium carbonate  1 tablet Oral BID WC  . cholecalciferol  400 Units Oral Daily  . [START ON 09/25/2016] conjugated estrogens  0.5 g Vaginal Q Thu  . gatifloxacin  1 drop Both Eyes QID  . guaiFENesin  600 mg Oral BID  . heparin  5,000 Units Subcutaneous Q8H  . ipratropium-albuterol  3 mL Nebulization Q6H  . levothyroxine  100 mcg Oral QAC breakfast  . metoprolol succinate  50 mg Oral Daily  . pantoprazole  40 mg Oral Daily  . senna  1 tablet Oral BID  . simvastatin  40 mg Oral q1800  . sodium chloride flush  3 mL Intravenous Q12H  . trimethoprim  100 mg Oral Daily  . vitamin B-12  500 mcg Oral Daily  . vitamin C  500 mg Oral Daily  . Vitamin D (Ergocalciferol)  50,000 Units Oral Q7 days  . vitamin E  400 Units Oral Daily   Continuous: . sodium chloride    .  levofloxacin (LEVAQUIN) IV Stopped (09/24/16 0050)   FMZ:UAUEBV chloride, acetaminophen **OR** acetaminophen, albuterol, EPINEPHrine, guaiFENesin, hydrALAZINE, ondansetron **OR** ondansetron (ZOFRAN) IV, ondansetron, polyethylene glycol, sodium chloride flush, traZODone  Assesment: She was admitted with acute hypoxic respiratory failure. She looks like she has pulmonary hypertension on her CT and that  will be further evaluated with echocardiogram. She has pulmonary fibrosis which is relatively mild on CT. It has however progressed since 2016. I personally reviewed both CTs. She may have rheumatoid arthritis and if so treatment of the rheumatoid may help with her lung problem. Otherwise we'll plan to continue with current treatments. Principal Problem:   Acute respiratory failure with hypoxia ([facility]) Active Problems:   Essential hypertension   GASTROESOPHAGEAL REFLUX DISEASE   Single kidney   Pulmonary hypertension ([facility])   Pulmonary fibrosis ([facility])    Plan: Continue current treatments. A trial of steroids may be helpful but I'd like to see what her pulmonary function test looks like and that would be done as an outpatient. Echocardiogram will be very helpful in trying to decide whether we need to go from here.    LOS: 1 day   Jayel Inks L 09/24/2016, 9:29 AM

## 2016-09-25 DIAGNOSIS — K317 Polyp of stomach and duodenum: Secondary | ICD-10-CM | POA: Diagnosis not present

## 2016-09-25 LAB — BASIC METABOLIC PANEL
Anion gap: 6 (ref 5–15)
BUN: 11 mg/dL (ref 6–20)
CO2: 33 mmol/L — ABNORMAL HIGH (ref 22–32)
Calcium: 9.4 mg/dL (ref 8.9–10.3)
Chloride: 98 mmol/L — ABNORMAL LOW (ref 101–111)
Creatinine, Ser: 1.12 mg/dL — ABNORMAL HIGH (ref 0.44–1.00)
GFR calc Af Amer: 54 mL/min — ABNORMAL LOW (ref >60)
GFR calc non Af Amer: 46 mL/min — ABNORMAL LOW (ref >60)
Glucose, Bld: 108 mg/dL — ABNORMAL HIGH (ref 65–99)
Potassium: 3.9 mmol/L (ref 3.5–5.1)
Sodium: 137 mmol/L (ref 135–145)

## 2016-09-25 LAB — CBC WITH DIFFERENTIAL/PLATELET
Basophils Absolute: 0 10*3/uL (ref 0.0–0.1)
Basophils Relative: 0 %
Eosinophils Absolute: 0.4 10*3/uL (ref 0.0–0.7)
Eosinophils Relative: 4 %
HCT: 40.4 % (ref 36.0–46.0)
Hemoglobin: 12.6 g/dL (ref 12.0–15.0)
Lymphocytes Relative: 26 %
Lymphs Abs: 2.4 10*3/uL (ref 0.7–4.0)
MCH: 29.1 pg (ref 26.0–34.0)
MCHC: 31.2 g/dL (ref 30.0–36.0)
MCV: 93.3 fL (ref 78.0–100.0)
Monocytes Absolute: 1 10*3/uL (ref 0.1–1.0)
Monocytes Relative: 11 %
Neutro Abs: 5.4 10*3/uL (ref 1.7–7.7)
Neutrophils Relative %: 59 %
Platelets: 251 10*3/uL (ref 150–400)
RBC: 4.33 MIL/uL (ref 3.87–5.11)
RDW: 15 % (ref 11.5–15.5)
WBC: 9.2 10*3/uL (ref 4.0–10.5)

## 2016-09-25 LAB — ECHOCARDIOGRAM COMPLETE
Height: 68 in
Weight: 3808 oz

## 2016-09-25 LAB — MAGNESIUM: Magnesium: 2 mg/dL (ref 1.7–2.4)

## 2016-09-25 MED ORDER — POLYVINYL ALCOHOL 1.4 % OP SOLN
1.0000 [drp] | OPHTHALMIC | Status: DC | PRN
  Filled 2016-09-25: qty 15

## 2016-09-25 MED ORDER — TIOTROPIUM BROMIDE MONOHYDRATE 18 MCG IN CAPS: 18.0000 ug | ORAL_CAPSULE | Freq: Every day | RESPIRATORY_TRACT | 0 refills | Status: DC

## 2016-09-25 MED ORDER — LEVOFLOXACIN 750 MG PO TABS: 750.0000 mg | ORAL_TABLET | Freq: Every day | ORAL | 0 refills | Status: AC

## 2016-09-25 MED ORDER — ALBUTEROL SULFATE HFA 108 (90 BASE) MCG/ACT IN AERS: 2.0000 | INHALATION_SPRAY | Freq: Four times a day (QID) | RESPIRATORY_TRACT | 2 refills | Status: DC | PRN

## 2016-09-25 NOTE — Discharge Summary (Signed)
Physician Discharge Summary  Lorraine Leonard JOI:[id number] DOB: [birth year] DOA: 09/23/2016  PCP: Sharion Balloon, FNP  Admit date: 09/23/2016 Discharge date: 09/25/2016  Admitted From: Home Disposition:  Home   Recommendations for Outpatient Follow-up:  1. Follow up with PCP in 1-2 weeks 2. Please obtain BMP/CBC in one week   Home Health: YES Equipment/Devices: 3L oxygen, PT  Discharge Condition: Stable CODE STATUS: FULL Diet recommendation: Heart Healthy   Brief/Interim Summary: 76 year old female with a history of hypertension, impaired glucose tolerance, hypothyroidism, hyperlipidemia presents with a 4 month history of worsening shortness of breath and dyspnea on exertion. The patient complains of a chronic cough during this period of time producing white sputum. She denies any hemoptysis, nausea, vomiting, diarrhea. She denies any fevers, chills, abdominal pain, dysuria, hematuria. She has not been started on any new medications. She denies any worsening lower extremity edema or increasing abdominal girth.  The patient denies any recent travels or occupational exposures in the past. The patient had previously seen pulmonary medicine, Dr. Baltazar Apo secondary to interstitial and nodular changes on CT in the left lower lobe. She had continue to follow Dr. Kyung Rudd on an annual basis for her interstitial lung disease and history of rheumatoid arthritis. There was a question whether the patient actually had rheumatoid arthritis. She followed up with rheumatology, Dr. Gavin Pound. Per the patient, she was told that "more or less, I did not have rheumatoid arthritis".  Nevertheless, the patient was noted to be hypoxic when she saw her primary care provider on 09/23/2016 with oxygen saturation down into the mid 70s with minimal activity. As result, the patient was sent to the emergency department for further evaluation.  Discharge Diagnoses:  Acute respiratory failure with hypoxia -Secondary to  pulmonary fibrosis and interstitial lung disease -Last saw Dr. Baltazar Apo 11/09/15 -it appears her ILD has progressed in past 10 months -stable on 3L -appreciate pulmonary consult--Dr. Luan Pulling -repeat outpatient PFTs -09/23/2016 CT angiogram chest--bilateral lower lobe interstitial thickening with mild fibrosis progressed since CT 11/17/2014; dilated pulmonary artery -Continue bronchodilators -09/25/16--case discussed with Dr. Zada Finders with discharge home and outpt PFTs and sleep study--home with spiriva and albuterol rescue inhaler -home with 3L [place]--desaturation with ambulation on RA -levofloxacin x 3 more days to complete 5 days of therapy  Pulmonary HTN -suspected PAH  -based on the size of her pulmonary artery on CT scan of the chest -11/23/2015 echo--EF 65-70%, normal PASP -09/24/16--repeat echo--EF 60-65%, grade 2 DD, PASP 25  Atypical chest pain -personally reviewed EKG--sinus with PACs and nonconducted atrial beats, nonspecific T-wave changes -personally reviewed CXR--bibasilar atelectasis -cycle troponins--neg x 3  Essential hypertension -Controlled on metoprolol succinate -will not restart valsartan HCTZ--->BP remained controlled  -outpt followup  Hyperlipidemia -Continue statin  Hypothyroidism -Continue Synthroid   Discharge Instructions  Discharge Instructions    Diet - low sodium heart healthy    Complete by:  As directed    Increase activity slowly    Complete by:  As directed      Allergies as of 09/25/2016      Reactions   Allopurinol    Mobic [meloxicam] Swelling, Other (See Comments)   Leg swelling      Medication List    STOP taking these medications   valsartan-hydrochlorothiazide 160-12.5 MG tablet Commonly known as:  DIOVAN-HCT     TAKE these medications   aspirin EC 81 MG tablet Take 81 mg by mouth daily.   azelastine 0.1 % nasal spray Commonly known as:  ASTELIN PLACE 2 SPRAYS INTO BOTH NOSTRILS 2 (TWO) TIMES DAILY AS  NEEDED. What changed:  See the new instructions.   calcium citrate 950 MG tablet Commonly known as:  CALCITRATE - dosed in mg elemental calcium Take 1 tablet by mouth 2 (two) times daily.   cholecalciferol 400 units Tabs tablet Commonly known as:  VITAMIN D Take 400 Units by mouth daily.   conjugated estrogens vaginal cream Commonly known as:  PREMARIN Place 0.5 g vaginally every Thursday. Use as directed   cyanocobalamin 500 MCG tablet Take 500 mcg by mouth daily.   DEXILANT 60 MG capsule Generic drug:  dexlansoprazole TAKE 1 CAPSULE (60 MG TOTAL) BY MOUTH DAILY.   diazepam 5 MG tablet Commonly known as:  VALIUM TAKE 1 TABLET TWICE A DAY AS NEEDED FOR ANXIETY What changed:  how much to take  how to take this  when to take this  reasons to take this  additional instructions   EPINEPHrine 0.3 mg/0.3 mL Soaj injection Commonly known as:  EPI-PEN Inject 0.3 mg into the muscle once as needed (For anaphylaxis.).   fluticasone 50 MCG/ACT nasal spray Commonly known as:  FLONASE PLACE 1 SPRAY INTO BOTH NOSTRILS 2 (TWO) TIMES DAILY AS NEEDED FOR ALLERGIES OR RHINITIS. What changed:  when to take this   levocetirizine 5 MG tablet Commonly known as:  XYZAL Take 5 mg by mouth at bedtime.   levofloxacin 750 MG tablet Commonly known as:  LEVAQUIN Take 1 tablet (750 mg total) by mouth daily. What changed:  medication strength  how much to take   levothyroxine 100 MCG tablet Commonly known as:  SYNTHROID Take 1 tablet (100 mcg total) by mouth daily before breakfast.   metoprolol succinate 50 MG 24 hr tablet Commonly known as:  TOPROL-XL TAKE 1 TABLET (50 MG TOTAL) BY MOUTH DAILY. TAKE WITH OR IMMEDIATELY FOLLOWING A MEAL.   mometasone 0.1 % cream Commonly known as:  ELOCON APPLY TO AFFECTED AREA DAILY   ondansetron 8 MG tablet Commonly known as:  ZOFRAN Take 8 mg by mouth every 8 (eight) hours as needed for nausea or vomiting.   OVER THE COUNTER  MEDICATION Place 1 drop into both eyes as needed (For dry eyes.). Systane Gel Drops   PATADAY 0.2 % Soln Generic drug:  Olopatadine HCl Place 1 drop into both eyes daily.   polyethylene glycol powder powder Commonly known as:  GLYCOLAX/MIRALAX TAKE 17 G BY MOUTH 2 (TWO) TIMES DAILY AS NEEDED.   PROAIR HFA 108 (90 Base) MCG/ACT inhaler Generic drug:  albuterol INHALE 2 PUFFS INTO THE LUNGS EVERY 6 (SIX) HOURS AS NEEDED FOR WHEEZING. What changed:  Another medication with the same name was added. Make sure you understand how and when to take each.   albuterol 108 (90 Base) MCG/ACT inhaler Commonly known as:  PROVENTIL HFA;VENTOLIN HFA Inhale 2 puffs into the lungs every 6 (six) hours as needed for wheezing or shortness of breath. What changed:  You were already taking a medication with the same name, and this prescription was added. Make sure you understand how and when to take each.   simvastatin 40 MG tablet Commonly known as:  ZOCOR TAKE 1 TABLET (40 MG TOTAL) BY MOUTH AT BEDTIME.   tiotropium 18 MCG inhalation capsule Commonly known as:  SPIRIVA HANDIHALER Place 1 capsule (18 mcg total) into inhaler and inhale daily.   traMADol 50 MG tablet Commonly known as:  ULTRAM TAKE 1 TABLET BY MOUTH EVERY 6 HOURS AS  NEEDED What changed:  See the new instructions.   trimethoprim 100 MG tablet Commonly known as:  TRIMPEX Take 100 mg by mouth daily.   VIGAMOX 0.5 % ophthalmic solution Generic drug:  moxifloxacin Apply 1 drop to eye as needed (Instill into affected eye four times daily for 2 days after each eye injection for macular degeneration.).   vitamin C 500 MG tablet Commonly known as:  ASCORBIC ACID Take 500 mg by mouth daily.   Vitamin D (Ergocalciferol) 50000 units Caps capsule Commonly known as:  DRISDOL TAKE 1 CAPSULE (50,000 UNITS TOTAL) BY MOUTH EVERY 7 (SEVEN) DAYS.   vitamin E 400 UNIT capsule Take 400 Units by mouth daily.            Durable Medical  Equipment        Start     Ordered   09/25/16 1333  For home use only DME oxygen  Once    Question Answer Comment  Mode or (Route) Nasal cannula   Liters per Minute 3   Frequency Continuous (stationary and portable oxygen unit needed)   Oxygen conserving device Yes   Oxygen delivery system Gas      09/25/16 1333      Allergies  Allergen Reactions  . Allopurinol   . Mobic [Meloxicam] Swelling and Other (See Comments)    Leg swelling    Consultations:  Pulmonary--Dr. Luan Pulling   Procedures/Studies: Dg Chest 2 View  Result Date: 09/23/2016 CLINICAL DATA:  Shortness of breath with productive cough EXAM: CHEST  2 VIEW COMPARISON:  CT 11/17/2014, radiograph 11/02/2013 FINDINGS: Hyperinflation. Streaky atelectasis or infiltrate at the left base. No pleural effusion. Stable cardiomediastinal silhouette with atherosclerosis. No pneumothorax. IMPRESSION: Mild streaky atelectasis or infiltrate at the left base. Electronically Signed   By: Donavan Foil M.D.   On: 09/23/2016 16:24   Ct Angio Chest Pe W/cm &/or Wo Cm  Result Date: 09/23/2016 CLINICAL DATA:  Cough, shortness of breath for 1.5 months EXAM: CT ANGIOGRAPHY CHEST WITH CONTRAST TECHNIQUE: Multidetector CT imaging of the chest was performed using the standard protocol during bolus administration of intravenous contrast. Multiplanar CT image reconstructions and MIPs were obtained to evaluate the vascular anatomy. CONTRAST:  100 mL Isovue 370 COMPARISON:  11/17/2014 FINDINGS: Cardiovascular: Satisfactory opacification of the pulmonary arteries to the segmental level. No evidence of pulmonary embolism. Main pulmonary artery is dilated measuring 4.2 cm in diameter. Right main pulmonary artery is dilated measuring 3 cm. Normal heart size. No pericardial effusion. Thoracic aorta is normal in caliber. No thoracic aortic dissection. Thoracic aortic atherosclerosis. Mediastinum/Nodes: Right lower paratracheal lymph node measures 13 mm in short  axis. Mild right hilar lymphadenopathy with the largest measuring 10 mm in short axis. Thyroid gland, trachea, and esophagus demonstrate no significant findings. Lungs/Pleura: 6 mm right lower lobe pulmonary nodule. Bilateral lower lobe mild interstitial thickening consistent with mild fibrosis which has progressed compared with 11/17/2014. No focal consolidation, pleural effusion or pneumothorax. Upper Abdomen: No acute abnormality. Musculoskeletal: No acute osseous abnormality. No lytic or sclerotic osseous lesion. Review of the MIP images confirms the above findings. IMPRESSION: 1. No pulmonary artery embolus. 2. Dilatation of the main pulmonary artery as can be seen with pulmonary arterial hypertension. 3. Stable 6 mm right lower lobe pulmonary nodule unchanged compared with 11/17/2014. Electronically Signed   By: Kathreen Devoid   On: 09/23/2016 21:22        Discharge Exam: Vitals:   09/24/16 2043 09/25/16 0520  BP: (!) 147/50 Marland Kitchen)  120/59  Pulse: 61 60  Resp: 18 18  Temp: 97.6 F (36.4 C) 97.6 F (36.4 C)   Vitals:   09/25/16 1040 09/25/16 1041 09/25/16 1042 09/25/16 1043  BP:      Pulse:      Resp:      Temp:      TempSrc:      SpO2: (!) 87% (!) 88% 95% 94%  Weight:      Height:        General: Pt is alert, awake, not in acute distress Cardiovascular: RRR, S1/S2 +, no rubs, no gallops Respiratory: CTA bilaterally, no wheezing, no rhonchi Abdominal: Soft, NT, ND, bowel sounds + Extremities: no edema, no cyanosis   The results of significant diagnostics from this hospitalization (including imaging, microbiology, ancillary and laboratory) are listed below for reference.    Significant Diagnostic Studies: Dg Chest 2 View  Result Date: 09/23/2016 CLINICAL DATA:  Shortness of breath with productive cough EXAM: CHEST  2 VIEW COMPARISON:  CT 11/17/2014, radiograph 11/02/2013 FINDINGS: Hyperinflation. Streaky atelectasis or infiltrate at the left base. No pleural effusion. Stable  cardiomediastinal silhouette with atherosclerosis. No pneumothorax. IMPRESSION: Mild streaky atelectasis or infiltrate at the left base. Electronically Signed   By: Donavan Foil M.D.   On: 09/23/2016 16:24   Ct Angio Chest Pe W/cm &/or Wo Cm  Result Date: 09/23/2016 CLINICAL DATA:  Cough, shortness of breath for 1.5 months EXAM: CT ANGIOGRAPHY CHEST WITH CONTRAST TECHNIQUE: Multidetector CT imaging of the chest was performed using the standard protocol during bolus administration of intravenous contrast. Multiplanar CT image reconstructions and MIPs were obtained to evaluate the vascular anatomy. CONTRAST:  100 mL Isovue 370 COMPARISON:  11/17/2014 FINDINGS: Cardiovascular: Satisfactory opacification of the pulmonary arteries to the segmental level. No evidence of pulmonary embolism. Main pulmonary artery is dilated measuring 4.2 cm in diameter. Right main pulmonary artery is dilated measuring 3 cm. Normal heart size. No pericardial effusion. Thoracic aorta is normal in caliber. No thoracic aortic dissection. Thoracic aortic atherosclerosis. Mediastinum/Nodes: Right lower paratracheal lymph node measures 13 mm in short axis. Mild right hilar lymphadenopathy with the largest measuring 10 mm in short axis. Thyroid gland, trachea, and esophagus demonstrate no significant findings. Lungs/Pleura: 6 mm right lower lobe pulmonary nodule. Bilateral lower lobe mild interstitial thickening consistent with mild fibrosis which has progressed compared with 11/17/2014. No focal consolidation, pleural effusion or pneumothorax. Upper Abdomen: No acute abnormality. Musculoskeletal: No acute osseous abnormality. No lytic or sclerotic osseous lesion. Review of the MIP images confirms the above findings. IMPRESSION: 1. No pulmonary artery embolus. 2. Dilatation of the main pulmonary artery as can be seen with pulmonary arterial hypertension. 3. Stable 6 mm right lower lobe pulmonary nodule unchanged compared with 11/17/2014.  Electronically Signed   By: Kathreen Devoid   On: 09/23/2016 21:22     Microbiology: Recent Results (from the past 240 hour(s))  Culture, blood (Routine X 2) w Reflex to ID Panel     Status: None (Preliminary result)   Collection Time: 09/23/16 10:01 PM  Result Value Ref Range Status   Specimen Description BLOOD LEFT FOREARM  Final   Special Requests Blood Culture adequate volume  Final   Culture NO GROWTH 2 DAYS  Final   Report Status PENDING  Incomplete  Culture, blood (Routine X 2) w Reflex to ID Panel     Status: None (Preliminary result)   Collection Time: 09/23/16 10:11 PM  Result Value Ref Range Status   Specimen Description  BLOOD RIGHT HAND  Final   Special Requests   Final    Blood Culture results may not be optimal due to an inadequate volume of blood received in culture bottles   Culture NO GROWTH 2 DAYS  Final   Report Status PENDING  Incomplete     Labs: Basic Metabolic Panel:  Recent Labs Lab 09/23/16 1955 09/25/16 0634  NA 141 137  K 4.2 3.9  CL 99* 98*  CO2 33* 33*  GLUCOSE 114* 108*  BUN 9 11  CREATININE 0.98 1.12*  CALCIUM 10.1 9.4  MG  --  2.0   Liver Function Tests: No results for input(s): AST, ALT, ALKPHOS, BILITOT, PROT, ALBUMIN in the last 168 hours. No results for input(s): LIPASE, AMYLASE in the last 168 hours. No results for input(s): AMMONIA in the last 168 hours. CBC:  Recent Labs Lab 09/23/16 1955 09/25/16 0634  WBC 14.3* 9.2  NEUTROABS 10.0* 5.4  HGB 13.7 12.6  HCT 43.6 40.4  MCV 92.8 93.3  PLT 287 251   Cardiac Enzymes:  Recent Labs Lab 09/23/16 1955 09/24/16 1547 09/24/16 2152  TROPONINI <0.03 <0.03 <0.03   BNP: Invalid input(s): POCBNP CBG:  Recent Labs Lab 09/24/16 1642  GLUCAP 134*    Time coordinating discharge:  Greater than 30 minutes  Signed:  Kazi Reppond, DO [facility] Pager: [phone] 09/25/2016, 1:47 PM

## 2016-09-25 NOTE — Progress Notes (Signed)
SATURATION QUALIFICATIONS: (This note is used to comply with regulatory documentation for home oxygen)  Patient Saturations on Room Air at Rest = 89%  Patient Saturations on Room Air while Ambulating = 84%  Patient Saturations on 6Liters of oxygen while Ambulating = 95%  Please briefly explain why patient needs home oxygen: Patient was very SOB while ambulating.  She ambulated greater than 250 feet.  It was attempted to decrease the amount of liters while ambulating, but the O2 sats continued to dropped to 84%.  There was no other complaints while ambulating.  I notified CM and I will notify the MD as well.

## 2016-09-25 NOTE — Progress Notes (Signed)
HOME OXYGEN QUALIFICATION NOTE  Oxygen saturation on room air before ambulation--93%  Oxygen saturation on room air with ambulation--83%  Oxygen saturation on room air after ambulation--86%  Oxygen saturation on 3 Liters nasal cannula at rest after ambulation--95%  Shanon Brow Kellie Chisolm, DO

## 2016-09-25 NOTE — Progress Notes (Signed)
Subjective: She says she still doesn't feel much better. She is confused about what's going on. I went through what we know now and told her we still have other tests that need to be done. She will need to have outpatient pulmonary function testing. She will need to have an outpatient sleep study. She agrees to have these done. Echocardiogram has been done but report is not available yet. She is still short of breath. She is still coughing.  Objective: Vital signs in last 24 hours: Temp:  [97.6 F (36.4 C)-97.8 F (36.6 C)] 97.6 F (36.4 C) (06/07 0520) Pulse Rate:  [60-71] 60 (06/07 0520) Resp:  [18-20] 18 (06/07 0520) BP: (119-147)/(49-59) 120/59 (06/07 0520) SpO2:  [90 %-95 %] 91 % (06/07 0745) Weight change:  Last BM Date: 09/23/16  Intake/Output from previous day: 06/06 0701 - 06/07 0700 In: 720 [P.O.:720] Out: 950 [Urine:950]  PHYSICAL EXAM General appearance: alert, cooperative and no distress Resp: rhonchi bilaterally Cardio: regular rate and rhythm, S1, S2 normal, no murmur, click, rub or gallop GI: soft, non-tender; bowel sounds normal; no masses,  no organomegaly Extremities: extremities normal, atraumatic, no cyanosis or edema Mucous membranes are moist  Lab Results:  Results for orders placed or performed during the hospital encounter of 09/23/16 (from the past 48 hour(s))  Basic metabolic panel     Status: Abnormal   Collection Time: 09/23/16  7:55 PM  Result Value Ref Range   Sodium 141 135 - 145 mmol/L   Potassium 4.2 3.5 - 5.1 mmol/L   Chloride 99 (L) 101 - 111 mmol/L   CO2 33 (H) 22 - 32 mmol/L   Glucose, Bld 114 (H) 65 - 99 mg/dL   BUN 9 6 - 20 mg/dL   Creatinine, Ser 0.98 0.44 - 1.00 mg/dL   Calcium 10.1 8.9 - 10.3 mg/dL   GFR calc non Af Amer 55 (L) >60 mL/min   GFR calc Af Amer >60 >60 mL/min    Comment: (NOTE) The eGFR has been calculated using the CKD EPI equation. This calculation has not been validated in all clinical situations. eGFR's  persistently <60 mL/min signify possible Chronic Kidney Disease.    Anion gap 9 5 - 15  Lactic acid, plasma     Status: None   Collection Time: 09/23/16  7:55 PM  Result Value Ref Range   Lactic Acid, Venous 1.6 0.5 - 1.9 mmol/L  CBC with Differential     Status: Abnormal   Collection Time: 09/23/16  7:55 PM  Result Value Ref Range   WBC 14.3 (H) 4.0 - 10.5 K/uL   RBC 4.70 3.87 - 5.11 MIL/uL   Hemoglobin 13.7 12.0 - 15.0 g/dL   HCT 43.6 36.0 - 46.0 %   MCV 92.8 78.0 - 100.0 fL   MCH 29.1 26.0 - 34.0 pg   MCHC 31.4 30.0 - 36.0 g/dL   RDW 15.0 11.5 - 15.5 %   Platelets 287 150 - 400 K/uL   Neutrophils Relative % 69 %   Neutro Abs 10.0 (H) 1.7 - 7.7 K/uL   Lymphocytes Relative 20 %   Lymphs Abs 2.8 0.7 - 4.0 K/uL   Monocytes Relative 9 %   Monocytes Absolute 1.2 (H) 0.1 - 1.0 K/uL   Eosinophils Relative 2 %   Eosinophils Absolute 0.3 0.0 - 0.7 K/uL   Basophils Relative 0 %   Basophils Absolute 0.0 0.0 - 0.1 K/uL  Brain natriuretic peptide     Status: Abnormal   Collection  Time: 09/23/16  7:55 PM  Result Value Ref Range   B Natriuretic Peptide 313.0 (H) 0.0 - 100.0 pg/mL  Troponin I     Status: None   Collection Time: 09/23/16  7:55 PM  Result Value Ref Range   Troponin I <0.03 <0.03 ng/mL  Culture, blood (Routine X 2) w Reflex to ID Panel     Status: None (Preliminary result)   Collection Time: 09/23/16 10:01 PM  Result Value Ref Range   Specimen Description BLOOD LEFT FOREARM    Special Requests Blood Culture adequate volume    Culture NO GROWTH 2 DAYS    Report Status PENDING   Culture, blood (Routine X 2) w Reflex to ID Panel     Status: None (Preliminary result)   Collection Time: 09/23/16 10:11 PM  Result Value Ref Range   Specimen Description BLOOD RIGHT HAND    Special Requests      Blood Culture results may not be optimal due to an inadequate volume of blood received in culture bottles   Culture NO GROWTH 2 DAYS    Report Status PENDING   Blood gas, arterial      Status: Abnormal   Collection Time: 09/23/16 10:50 PM  Result Value Ref Range   FIO2 30.00    Delivery systems NASAL CANNULA    pH, Arterial 7.412 7.350 - 7.450   pCO2 arterial 48.5 (H) 32.0 - 48.0 mmHg   pO2, Arterial 65.1 (L) 83.0 - 108.0 mmHg   Bicarbonate 28.8 (H) 20.0 - 28.0 mmol/L   Acid-Base Excess 5.8 (H) 0.0 - 2.0 mmol/L   O2 Saturation 91.7 %   Collection site RADIAL    Drawn by [id number]    Sample type ARTERIAL    Allens test (pass/fail) PASS PASS  Troponin I     Status: None   Collection Time: 09/24/16  3:47 PM  Result Value Ref Range   Troponin I <0.03 <0.03 ng/mL  Glucose, capillary     Status: Abnormal   Collection Time: 09/24/16  4:42 PM  Result Value Ref Range   Glucose-Capillary 134 (H) 65 - 99 mg/dL   Comment 1 Notify RN    Comment 2 Document in Chart   Troponin I     Status: None   Collection Time: 09/24/16  9:52 PM  Result Value Ref Range   Troponin I <0.03 <0.03 ng/mL  Basic metabolic panel     Status: Abnormal   Collection Time: 09/25/16  6:34 AM  Result Value Ref Range   Sodium 137 135 - 145 mmol/L   Potassium 3.9 3.5 - 5.1 mmol/L   Chloride 98 (L) 101 - 111 mmol/L   CO2 33 (H) 22 - 32 mmol/L   Glucose, Bld 108 (H) 65 - 99 mg/dL   BUN 11 6 - 20 mg/dL   Creatinine, Ser 1.12 (H) 0.44 - 1.00 mg/dL   Calcium 9.4 8.9 - 10.3 mg/dL   GFR calc non Af Amer 46 (L) >60 mL/min   GFR calc Af Amer 54 (L) >60 mL/min    Comment: (NOTE) The eGFR has been calculated using the CKD EPI equation. This calculation has not been validated in all clinical situations. eGFR's persistently <60 mL/min signify possible Chronic Kidney Disease.    Anion gap 6 5 - 15  CBC with Differential/Platelet     Status: None   Collection Time: 09/25/16  6:34 AM  Result Value Ref Range   WBC 9.2 4.0 - 10.5 K/uL   RBC  4.33 3.87 - 5.11 MIL/uL   Hemoglobin 12.6 12.0 - 15.0 g/dL   HCT 40.4 36.0 - 46.0 %   MCV 93.3 78.0 - 100.0 fL   MCH 29.1 26.0 - 34.0 pg   MCHC 31.2 30.0 - 36.0  g/dL   RDW 15.0 11.5 - 15.5 %   Platelets 251 150 - 400 K/uL   Neutrophils Relative % 59 %   Neutro Abs 5.4 1.7 - 7.7 K/uL   Lymphocytes Relative 26 %   Lymphs Abs 2.4 0.7 - 4.0 K/uL   Monocytes Relative 11 %   Monocytes Absolute 1.0 0.1 - 1.0 K/uL   Eosinophils Relative 4 %   Eosinophils Absolute 0.4 0.0 - 0.7 K/uL   Basophils Relative 0 %   Basophils Absolute 0.0 0.0 - 0.1 K/uL  Magnesium     Status: None   Collection Time: 09/25/16  6:34 AM  Result Value Ref Range   Magnesium 2.0 1.7 - 2.4 mg/dL    ABGS  Recent Labs  09/23/16 2250  PHART 7.412  PO2ART 65.1*  HCO3 28.8*   CULTURES Recent Results (from the past 240 hour(s))  Culture, blood (Routine X 2) w Reflex to ID Panel     Status: None (Preliminary result)   Collection Time: 09/23/16 10:01 PM  Result Value Ref Range Status   Specimen Description BLOOD LEFT FOREARM  Final   Special Requests Blood Culture adequate volume  Final   Culture NO GROWTH 2 DAYS  Final   Report Status PENDING  Incomplete  Culture, blood (Routine X 2) w Reflex to ID Panel     Status: None (Preliminary result)   Collection Time: 09/23/16 10:11 PM  Result Value Ref Range Status   Specimen Description BLOOD RIGHT HAND  Final   Special Requests   Final    Blood Culture results may not be optimal due to an inadequate volume of blood received in culture bottles   Culture NO GROWTH 2 DAYS  Final   Report Status PENDING  Incomplete   Studies/Results: Dg Chest 2 View  Result Date: 09/23/2016 CLINICAL DATA:  Shortness of breath with productive cough EXAM: CHEST  2 VIEW COMPARISON:  CT 11/17/2014, radiograph 11/02/2013 FINDINGS: Hyperinflation. Streaky atelectasis or infiltrate at the left base. No pleural effusion. Stable cardiomediastinal silhouette with atherosclerosis. No pneumothorax. IMPRESSION: Mild streaky atelectasis or infiltrate at the left base. Electronically Signed   By: Donavan Foil M.D.   On: 09/23/2016 16:24   Ct Angio Chest Pe  W/cm &/or Wo Cm  Result Date: 09/23/2016 CLINICAL DATA:  Cough, shortness of breath for 1.5 months EXAM: CT ANGIOGRAPHY CHEST WITH CONTRAST TECHNIQUE: Multidetector CT imaging of the chest was performed using the standard protocol during bolus administration of intravenous contrast. Multiplanar CT image reconstructions and MIPs were obtained to evaluate the vascular anatomy. CONTRAST:  100 mL Isovue 370 COMPARISON:  11/17/2014 FINDINGS: Cardiovascular: Satisfactory opacification of the pulmonary arteries to the segmental level. No evidence of pulmonary embolism. Main pulmonary artery is dilated measuring 4.2 cm in diameter. Right main pulmonary artery is dilated measuring 3 cm. Normal heart size. No pericardial effusion. Thoracic aorta is normal in caliber. No thoracic aortic dissection. Thoracic aortic atherosclerosis. Mediastinum/Nodes: Right lower paratracheal lymph node measures 13 mm in short axis. Mild right hilar lymphadenopathy with the largest measuring 10 mm in short axis. Thyroid gland, trachea, and esophagus demonstrate no significant findings. Lungs/Pleura: 6 mm right lower lobe pulmonary nodule. Bilateral lower lobe mild interstitial thickening consistent with  mild fibrosis which has progressed compared with 11/17/2014. No focal consolidation, pleural effusion or pneumothorax. Upper Abdomen: No acute abnormality. Musculoskeletal: No acute osseous abnormality. No lytic or sclerotic osseous lesion. Review of the MIP images confirms the above findings. IMPRESSION: 1. No pulmonary artery embolus. 2. Dilatation of the main pulmonary artery as can be seen with pulmonary arterial hypertension. 3. Stable 6 mm right lower lobe pulmonary nodule unchanged compared with 11/17/2014. Electronically Signed   By: Kathreen Devoid   On: 09/23/2016 21:22    Medications:  Prior to Admission:  Prescriptions Prior to Admission  Medication Sig Dispense Refill Last Dose  . aspirin EC 81 MG tablet Take 81 mg by mouth  daily.   09/23/2016 at Unknown time  . azelastine (ASTELIN) 0.1 % nasal spray PLACE 2 SPRAYS INTO BOTH NOSTRILS 2 (TWO) TIMES DAILY AS NEEDED. (Patient taking differently: PLACE 2 SPRAYS INTO BOTH NOSTRILS AT BEDTIME) 30 mL 4 09/22/2016 at Unknown time  . calcium citrate (CALCITRATE - DOSED IN MG ELEMENTAL CALCIUM) 950 MG tablet Take 1 tablet by mouth 2 (two) times daily.    09/23/2016 at Unknown time  . cholecalciferol (VITAMIN D) 400 units TABS tablet Take 400 Units by mouth daily.   09/23/2016 at Unknown time  . conjugated estrogens (PREMARIN) vaginal cream Place 0.5 g vaginally every Thursday. Use as directed   09/18/2016 at Unknown time  . cyanocobalamin 500 MCG tablet Take 500 mcg by mouth daily.    09/23/2016 at Unknown time  . DEXILANT 60 MG capsule TAKE 1 CAPSULE (60 MG TOTAL) BY MOUTH DAILY. 30 capsule 5 09/23/2016 at Unknown time  . diazepam (VALIUM) 5 MG tablet TAKE 1 TABLET TWICE A DAY AS NEEDED FOR ANXIETY (Patient taking differently: Take 5 mg by mouth 2 (two) times daily as needed for anxiety. ) 60 tablet 5 09/22/2016 at Unknown time  . EPINEPHrine 0.3 mg/0.3 mL IJ SOAJ injection Inject 0.3 mg into the muscle once as needed (For anaphylaxis.).   UNKNOWN  . fluticasone (FLONASE) 50 MCG/ACT nasal spray PLACE 1 SPRAY INTO BOTH NOSTRILS 2 (TWO) TIMES DAILY AS NEEDED FOR ALLERGIES OR RHINITIS. (Patient taking differently: Place 1 spray into both nostrils 2 (two) times daily. ) 16 g 5 09/23/2016 at Unknown time  . levocetirizine (XYZAL) 5 MG tablet Take 5 mg by mouth at bedtime.    09/22/2016 at Unknown time  . levothyroxine (SYNTHROID) 100 MCG tablet Take 1 tablet (100 mcg total) by mouth daily before breakfast. 90 tablet 1 09/23/2016 at Unknown time  . metoprolol succinate (TOPROL-XL) 50 MG 24 hr tablet TAKE 1 TABLET (50 MG TOTAL) BY MOUTH DAILY. TAKE WITH OR IMMEDIATELY FOLLOWING A MEAL. 90 tablet 3 09/22/2016 at 1800  . mometasone (ELOCON) 0.1 % cream APPLY TO AFFECTED AREA DAILY 45 g 2 Past Week at Unknown time   . ondansetron (ZOFRAN) 8 MG tablet Take 8 mg by mouth every 8 (eight) hours as needed for nausea or vomiting.   09/22/2016 at Unknown time  . PATADAY 0.2 % SOLN Place 1 drop into both eyes daily.   09/23/2016 at Unknown time  . polyethylene glycol powder (GLYCOLAX/MIRALAX) powder TAKE 17 G BY MOUTH 2 (TWO) TIMES DAILY AS NEEDED. 3162 g 0 Past Week at Unknown time  . PROAIR HFA 108 (90 Base) MCG/ACT inhaler INHALE 2 PUFFS INTO THE LUNGS EVERY 6 (SIX) HOURS AS NEEDED FOR WHEEZING. 8.5 Inhaler 1 09/23/2016 at Unknown time  . simvastatin (ZOCOR) 40 MG tablet TAKE 1 TABLET (  40 MG TOTAL) BY MOUTH AT BEDTIME. 30 tablet 3 09/22/2016 at Unknown time  . traMADol (ULTRAM) 50 MG tablet TAKE 1 TABLET BY MOUTH EVERY 6 HOURS AS NEEDED (Patient taking differently: TAKE 1 TABLET BY MOUTH EVERY 6 HOURS AS NEEDED FOR PAIN) 90 tablet 2 Past Week at Unknown time  . trimethoprim (TRIMPEX) 100 MG tablet Take 100 mg by mouth daily.   09/23/2016 at Unknown time  . valsartan-hydrochlorothiazide (DIOVAN-HCT) 160-12.5 MG tablet TAKE 1 TABLET BY MOUTH ONCE EVERY DAY 30 tablet 4 09/23/2016 at Unknown time  . VIGAMOX 0.5 % ophthalmic solution Apply 1 drop to eye as needed (Instill into affected eye four times daily for 2 days after each eye injection for macular degeneration.).   12 Taking  . vitamin C (ASCORBIC ACID) 500 MG tablet Take 500 mg by mouth daily.   09/23/2016 at Unknown time  . Vitamin D, Ergocalciferol, (DRISDOL) 50000 units CAPS capsule TAKE 1 CAPSULE (50,000 UNITS TOTAL) BY MOUTH EVERY 7 (SEVEN) DAYS. 12 capsule 0 Past Week at Unknown time  . vitamin E 400 UNIT capsule Take 400 Units by mouth daily.   09/23/2016 at Unknown time  . levofloxacin (LEVAQUIN) 500 MG tablet Take 1 tablet (500 mg total) by mouth daily. 7 tablet 0   . OVER THE COUNTER MEDICATION Place 1 drop into both eyes as needed (For dry eyes.). Systane Gel Drops   Taking   Scheduled: . aspirin EC  81 mg Oral Daily  . calcium carbonate  1 tablet Oral BID WC  .  cholecalciferol  400 Units Oral Daily  . conjugated estrogens  0.5 g Vaginal Q Thu  . gatifloxacin  1 drop Both Eyes QID  . guaiFENesin  600 mg Oral BID  . heparin  5,000 Units Subcutaneous Q8H  . ipratropium-albuterol  3 mL Nebulization TID  . levothyroxine  100 mcg Oral QAC breakfast  . metoprolol succinate  50 mg Oral Daily  . pantoprazole  40 mg Oral Daily  . senna  1 tablet Oral BID  . simvastatin  40 mg Oral q1800  . sodium chloride flush  3 mL Intravenous Q12H  . trimethoprim  100 mg Oral Daily  . vitamin B-12  500 mcg Oral Daily  . vitamin C  500 mg Oral Daily  . Vitamin D (Ergocalciferol)  50,000 Units Oral Q7 days  . vitamin E  400 Units Oral Daily   Continuous: . sodium chloride    . levofloxacin [facility]) IV Stopped (09/24/16 2306)   VZS:MOLMBE chloride, acetaminophen **OR** acetaminophen, albuterol, EPINEPHrine, guaiFENesin, hydrALAZINE, ondansetron **OR** ondansetron (ZOFRAN) IV, ondansetron, polyethylene glycol, sodium chloride flush, traZODone  Assesment: She was admitted with acute hypoxic respiratory failure. She has what looks like pulmonary hypertension on CT and she's had an echocardiogram that is pending. She has some element of pulmonary fibrosis. She may have sleep apnea and that will need to be checked Principal Problem:   Acute respiratory failure with hypoxia ([place]) Active Problems:   Essential hypertension   GASTROESOPHAGEAL REFLUX DISEASE   Single kidney   Pulmonary hypertension (HCC)   Pulmonary fibrosis ([place])    Plan: Continue treatments    LOS: 2 days   Mekhi Sonn L 09/25/2016, 8:49 AM

## 2016-09-25 NOTE — Progress Notes (Signed)
Notified Dr. Carles Collet via text of the ambulating on room air.

## 2016-09-25 NOTE — Care Management Important Message (Signed)
Important Message  Patient Details  Name: Lorraine Leonard MRN: [id number] Date of Birth: 11/21/1940   Medicare Important Message Given:  Yes    Sherald Barge, RN 09/25/2016, 2:09 PM

## 2016-09-25 NOTE — Care Management Note (Addendum)
Case Management Note  Patient Details  Name: Lorraine Leonard MRN: [id number] Date of Birth: 07-13-1940   Expected Discharge Date:  09/25/16               Expected Discharge Plan:  [place]  In-House Referral:  NA  Discharge planning Services  CM Consult  Post Acute Care Choice:  Durable Medical Equipment, Home Health Choice offered to:  Patient  DME Arranged:  Oxygen DME Agency:  [place] Arranged:  RN [facility] Agency:  [place]  Status of Service:  Completed, signed off  Additional Comments: Pt discharging home today. Will need [place] nursing and home oxygen. Pt will need oxygen because albuterol treatments have been trid and found to be insufficient. Pt has no preference of HH or DME provider. Pt and family at bedside agreeable to both from [facility]. They are aware [place] has 48hrs to make first visit. Romualdo Bolk, [facility] rep, aware of referral and will obtain pt info from chart and deliver DME to pt room prior to DC.    addendum 1600: pt requesting neb machine and treatments. MD made aware, meb machine ordered, [facility] rep aware and if meds ordered and DME order co-signed will deliver machine to pt room prior to DC.   Sherald Barge, RN 09/25/2016, 2:02 PM

## 2016-09-28 DIAGNOSIS — Z524 Kidney donor: Secondary | ICD-10-CM | POA: Diagnosis not present

## 2016-09-28 DIAGNOSIS — J8410 Pulmonary fibrosis, unspecified: Secondary | ICD-10-CM | POA: Diagnosis not present

## 2016-09-28 DIAGNOSIS — Z7982 Long term (current) use of aspirin: Secondary | ICD-10-CM | POA: Diagnosis not present

## 2016-09-28 DIAGNOSIS — Z905 Acquired absence of kidney: Secondary | ICD-10-CM | POA: Diagnosis not present

## 2016-09-28 DIAGNOSIS — F419 Anxiety disorder, unspecified: Secondary | ICD-10-CM | POA: Diagnosis not present

## 2016-09-28 DIAGNOSIS — I272 Pulmonary hypertension, unspecified: Secondary | ICD-10-CM | POA: Diagnosis not present

## 2016-09-28 DIAGNOSIS — Z9981 Dependence on supplemental oxygen: Secondary | ICD-10-CM | POA: Diagnosis not present

## 2016-09-28 DIAGNOSIS — K219 Gastro-esophageal reflux disease without esophagitis: Secondary | ICD-10-CM | POA: Diagnosis not present

## 2016-09-28 DIAGNOSIS — J849 Interstitial pulmonary disease, unspecified: Secondary | ICD-10-CM | POA: Diagnosis not present

## 2016-09-28 DIAGNOSIS — H3530 Unspecified macular degeneration: Secondary | ICD-10-CM | POA: Diagnosis not present

## 2016-09-28 DIAGNOSIS — M069 Rheumatoid arthritis, unspecified: Secondary | ICD-10-CM | POA: Diagnosis not present

## 2016-09-28 DIAGNOSIS — I10 Essential (primary) hypertension: Secondary | ICD-10-CM | POA: Diagnosis not present

## 2016-09-28 DIAGNOSIS — R7303 Prediabetes: Secondary | ICD-10-CM | POA: Diagnosis not present

## 2016-09-28 DIAGNOSIS — F329 Major depressive disorder, single episode, unspecified: Secondary | ICD-10-CM | POA: Diagnosis not present

## 2016-09-28 LAB — CULTURE, BLOOD (ROUTINE X 2)
Culture: NO GROWTH
Culture: NO GROWTH
Special Requests: ADEQUATE

## 2016-09-29 DIAGNOSIS — I10 Essential (primary) hypertension: Secondary | ICD-10-CM | POA: Diagnosis not present

## 2016-09-29 DIAGNOSIS — J8410 Pulmonary fibrosis, unspecified: Secondary | ICD-10-CM | POA: Diagnosis not present

## 2016-09-29 DIAGNOSIS — H3530 Unspecified macular degeneration: Secondary | ICD-10-CM | POA: Diagnosis not present

## 2016-09-29 DIAGNOSIS — I272 Pulmonary hypertension, unspecified: Secondary | ICD-10-CM | POA: Diagnosis not present

## 2016-09-29 DIAGNOSIS — J849 Interstitial pulmonary disease, unspecified: Secondary | ICD-10-CM | POA: Diagnosis not present

## 2016-09-29 DIAGNOSIS — K219 Gastro-esophageal reflux disease without esophagitis: Secondary | ICD-10-CM | POA: Diagnosis not present

## 2016-09-30 ENCOUNTER — Other Ambulatory Visit: Payer: Self-pay | Admitting: Family

## 2016-09-30 DIAGNOSIS — K219 Gastro-esophageal reflux disease without esophagitis: Secondary | ICD-10-CM | POA: Diagnosis not present

## 2016-09-30 DIAGNOSIS — H3530 Unspecified macular degeneration: Secondary | ICD-10-CM | POA: Diagnosis not present

## 2016-09-30 DIAGNOSIS — J8410 Pulmonary fibrosis, unspecified: Secondary | ICD-10-CM | POA: Diagnosis not present

## 2016-09-30 DIAGNOSIS — J849 Interstitial pulmonary disease, unspecified: Secondary | ICD-10-CM | POA: Diagnosis not present

## 2016-09-30 DIAGNOSIS — I10 Essential (primary) hypertension: Secondary | ICD-10-CM | POA: Diagnosis not present

## 2016-09-30 DIAGNOSIS — I272 Pulmonary hypertension, unspecified: Secondary | ICD-10-CM | POA: Diagnosis not present

## 2016-10-01 DIAGNOSIS — I10 Essential (primary) hypertension: Secondary | ICD-10-CM | POA: Diagnosis not present

## 2016-10-01 DIAGNOSIS — J8410 Pulmonary fibrosis, unspecified: Secondary | ICD-10-CM | POA: Diagnosis not present

## 2016-10-01 DIAGNOSIS — J849 Interstitial pulmonary disease, unspecified: Secondary | ICD-10-CM | POA: Diagnosis not present

## 2016-10-01 DIAGNOSIS — H3530 Unspecified macular degeneration: Secondary | ICD-10-CM | POA: Diagnosis not present

## 2016-10-01 DIAGNOSIS — K219 Gastro-esophageal reflux disease without esophagitis: Secondary | ICD-10-CM | POA: Diagnosis not present

## 2016-10-01 DIAGNOSIS — I272 Pulmonary hypertension, unspecified: Secondary | ICD-10-CM | POA: Diagnosis not present

## 2016-10-02 ENCOUNTER — Telehealth: Payer: Self-pay

## 2016-10-02 NOTE — Telephone Encounter (Signed)
Patients Spiriva 86mcg Inhaler is not covered by patients insurance. Pharmacy is requesting an alternative and suggests Incruse Ellipta 62.5 mcg. Please advise and send to pharmacy

## 2016-10-03 DIAGNOSIS — H3530 Unspecified macular degeneration: Secondary | ICD-10-CM | POA: Diagnosis not present

## 2016-10-03 DIAGNOSIS — I272 Pulmonary hypertension, unspecified: Secondary | ICD-10-CM | POA: Diagnosis not present

## 2016-10-03 DIAGNOSIS — K219 Gastro-esophageal reflux disease without esophagitis: Secondary | ICD-10-CM | POA: Diagnosis not present

## 2016-10-03 DIAGNOSIS — J849 Interstitial pulmonary disease, unspecified: Secondary | ICD-10-CM | POA: Diagnosis not present

## 2016-10-03 DIAGNOSIS — J8410 Pulmonary fibrosis, unspecified: Secondary | ICD-10-CM | POA: Diagnosis not present

## 2016-10-03 DIAGNOSIS — I10 Essential (primary) hypertension: Secondary | ICD-10-CM | POA: Diagnosis not present

## 2016-10-03 MED ORDER — UMECLIDINIUM BROMIDE 62.5 MCG/INH IN AEPB: 1.0000 | INHALATION_SPRAY | Freq: Every day | RESPIRATORY_TRACT | 3 refills | Status: DC

## 2016-10-03 NOTE — Telephone Encounter (Signed)
Incruse rx sent to pharmay and Spiriva d/c

## 2016-10-06 ENCOUNTER — Ambulatory Visit (INDEPENDENT_AMBULATORY_CARE_PROVIDER_SITE_OTHER): Payer: Medicare Other | Admitting: Family

## 2016-10-06 ENCOUNTER — Encounter: Payer: Self-pay | Admitting: Family

## 2016-10-06 ENCOUNTER — Telehealth: Payer: Self-pay | Admitting: Family

## 2016-10-06 VITALS — BP 142/68 | HR 52 | Temp 97.4°F | Ht 68.0 in | Wt 238.0 lb

## 2016-10-06 DIAGNOSIS — J8410 Pulmonary fibrosis, unspecified: Secondary | ICD-10-CM

## 2016-10-06 DIAGNOSIS — I272 Pulmonary hypertension, unspecified: Secondary | ICD-10-CM | POA: Diagnosis not present

## 2016-10-06 DIAGNOSIS — I517 Cardiomegaly: Secondary | ICD-10-CM

## 2016-10-06 DIAGNOSIS — J9601 Acute respiratory failure with hypoxia: Secondary | ICD-10-CM

## 2016-10-06 DIAGNOSIS — Z09 Encounter for follow-up examination after completed treatment for conditions other than malignant neoplasm: Secondary | ICD-10-CM

## 2016-10-06 NOTE — Telephone Encounter (Signed)
Pt aware our referral coordinators will giver a call when the appointment has been made

## 2016-10-06 NOTE — Patient Instructions (Signed)
Pulmonary Fibrosis °Pulmonary fibrosis is a type of lung disease that causes scarring. Over time, the scar tissue (fibrosis) builds up in the air sacs of your lungs. This makes it hard for you to breathe. Less oxygen can get into your blood. °Scarring from pulmonary fibrosis gets worse over time. This damage is permanent. Having damaged lungs may make it more likely that you will have heart problems as well. °What are the causes? °Usually, the cause of pulmonary fibrosis is not known (idiopathic pulmonary fibrosis). However, pulmonary fibrosis can be caused by: °· Exposure to occupational and environmental toxins. These include asbestos, silica, and metal dusts. °· Inhaling moldy hay. This can cause an allergic reaction in the lung (farmer's lung) that can lead to pulmonary fibrosis. °· Inhaling toxic fumes. °· Certain medicines. These include drugs used in radiation therapy or used to treat seizures, heart problems, and some infections. °· Autoimmune diseases, such as rheumatoid arthritis, systemic sclerosis, and connective tissue diseases. °· Sarcoidosis. In this disease, areas of inflammatory cells (granulomas) form and most often affect the lungs. °· Genes. Some cases of pulmonary fibrosis may be passed down through families. ° °What increases the risk? °You may be at a higher risk for developing pulmonary fibrosis if: °· You have a family history of the disease. °· You have an autoimmune disease or another condition linked to pulmonary fibrosis. °· You are exposed to certain substances or fumes found in agricultural, farm, construction, or factory work. °· You take certain medicines. ° °What are the signs or symptoms? °Symptoms may include: °· Difficulty breathing that gets worse with activity. °· Dry, hacking cough. °· Rapid, shallow breathing during exercise or while at rest. °· Shortness of breath that gets worse (dyspnea). °· Bluish skin and lips. °· Loss of appetite. °· Loss of strength. °· Weight loss and  fatigue. °· Rounded and enlarged fingertips (clubbing). ° °How is this diagnosed? °Your health care provider may suspect pulmonary fibrosis based on your symptoms and medical history. Diagnosis may include a physical exam. Your health care provider will check for signs that strongly suggest that you have pulmonary fibrosis, such as: °· Blue skin around your fingernails or mouth from reduced oxygen. °· Clubbing around the ends of your fingers. °· A crackling sound when you breathe. ° °Your health care provider may also do tests to confirm the diagnosis. These may include: °· Looking inside your lungs with an instrument (bronchoscopy). °· Imaging studies of your lungs and heart using: °? X-rays. °? CT scan. °? Sound waves (echocardiogram). °· Tests to measure how well you are breathing (pulmonary function tests). °· Exercise testing to see how well your lungs work while you are walking. °· Blood tests. °· A procedure to remove a small piece of lung tissue to examine in a lab (biopsy). ° °How is this treated? °There is no cure for pulmonary fibrosis. Treatments focus on managing symptoms and preventing scarring from getting worse. This can include: °· Medicines. °? You may take steroids to prevent permanent lung changes. Your health care provider may put you on a high dose at first, then on lower dosages for the long term. °? Medicines to suppress your body’s defense (immune) system. These can have serious side effects. °· You may be monitored with X-rays and laboratory work. °· Oxygen therapy may be helpful if oxygen in your blood is low. °· Surgery. In some cases, a lung transplant is an option. ° °Follow these instructions at home: °· Take medicines only as   directed by your health care provider. °· Keep your vaccinations up to date as recommended by your health care provider. °· Do not use any tobacco products, including cigarettes, chewing tobacco, or electronic cigarettes. If you need help quitting, ask your  health care provider. °· Get regular exercise, but do not overexert yourself. Ask your health care provider to suggest some activities that are safe for you to do. Walking and chair exercises can help if you have physical limitations. °· Consider joining a pulmonary rehabilitation program or a support group for people with pulmonary fibrosis. °· Eat small meals often so you do not get too full. Overeating can make breathing trouble worse. °· Maintain a healthy weight. Lose weight if you need to. °· Do breathing exercises as directed by your health care provider. °· Keep all follow-up visits as directed by your health care provider. This is important. °Contact a health care provider if: °· You are not able to be as active as usual. °· You have a long-lasting (chronic) cough. °· You are often short of breath. °· You have a fever or chills. °Get help right away if: °· You have chest pain. °· Your breathing is much worse. °· You cannot take a deep breath. °· You have blue skin around your mouth or fingers. °· You have clubbing of your fingers. °· You cough up mucus that is dark in color. °· You have a lot of headaches. °· You get very confused or sleepy. °This information is not intended to replace advice given to you by your health care provider. Make sure you discuss any questions you have with your health care provider. °Document Released: 06/28/2003 Document Revised: 09/13/2015 Document Reviewed: 09/15/2013 °Elsevier Interactive Patient Education © 2017 [employer] ° °

## 2016-10-06 NOTE — Progress Notes (Signed)
   Subjective:    Patient ID: Lorraine Leonard, female    DOB: 1941/03/10, 76 y.o.   MRN: [id number]  HPI Pt presents to the office today for hospital follow up. Pt was seen in the office on 09/23/16 with hypoxia related to secondary to pulmonary fibrosis and interstitial lung disease. Pt is seen by Dr. Luan Pulling, Pulmonary, in the hospital. PT placed 3l of Oxygen continuous. PT was seeing Dr. Lamonte Sakai annually, but requesting referral to see Dr. Luan Pulling.   Pt's HCTZ stopped since her BP was controlled in the hospital. Troponins negative in hospital.   [facility] notes reviewed.   Review of Systems  Respiratory: Positive for cough and shortness of breath.   All other systems reviewed and are negative.      Objective:   Physical Exam  Constitutional: She is oriented to person, place, and time. She appears well-developed and well-nourished. No distress.  HENT:  Head: Normocephalic.  Eyes: Pupils are equal, round, and reactive to light.  Neck: Normal range of motion. Neck supple. No thyromegaly present.  Cardiovascular: Normal rate, regular rhythm, normal heart sounds and intact distal pulses.   No murmur heard. Pulmonary/Chest: Effort normal. No respiratory distress. She has decreased breath sounds. She has no wheezes.  Pt on 3L of O2   Abdominal: Soft. Bowel sounds are normal. She exhibits no distension. There is no tenderness.  Musculoskeletal: Normal range of motion. She exhibits no edema or tenderness.  Neurological: She is alert and oriented to person, place, and time.  Skin: Skin is warm and dry.  Psychiatric: She has a normal mood and affect. Her behavior is normal. Judgment and thought content normal.  Vitals reviewed.     BP (!) 142/68   Pulse (!) 52   Temp 97.4 F (36.3 C) (Oral)   Ht '5\' 8"'$  (1.727 m)   Wt 238 lb (108 kg)   SpO2 93%   BMI 36.19 kg/m      Assessment & Plan:  1. Hospital discharge follow-up - CMP14+EGFR - CBC with Differential/Platelet  2. Pulmonary  hypertension ([place]) - CMP14+EGFR - CBC with Differential/Platelet - Ambulatory referral to Pulmonology  3. Cardiomegaly  - CMP14+EGFR - CBC with Differential/Platelet - Ambulatory referral to Pulmonology  4. Pulmonary fibrosis ([place]) - CMP14+EGFR - CBC with Differential/Platelet - Ambulatory referral to Pulmonology  5. Acute respiratory failure with hypoxia (HCC) - CMP14+EGFR - CBC with Differential/Platelet - Ambulatory referral to Pulmonology  Continue oxygen 3 L Will do referral to see Dr. Luan Pulling. Pt states it would be much easier for her to travel to [place] instead of [place].  Labs pending RTO prn and keep chronic follow up appts  Evelina Dun, FNP

## 2016-10-07 ENCOUNTER — Other Ambulatory Visit: Payer: Self-pay | Admitting: Family

## 2016-10-07 DIAGNOSIS — I10 Essential (primary) hypertension: Secondary | ICD-10-CM | POA: Diagnosis not present

## 2016-10-07 DIAGNOSIS — J8410 Pulmonary fibrosis, unspecified: Secondary | ICD-10-CM | POA: Diagnosis not present

## 2016-10-07 DIAGNOSIS — H3530 Unspecified macular degeneration: Secondary | ICD-10-CM | POA: Diagnosis not present

## 2016-10-07 DIAGNOSIS — I272 Pulmonary hypertension, unspecified: Secondary | ICD-10-CM | POA: Diagnosis not present

## 2016-10-07 DIAGNOSIS — J849 Interstitial pulmonary disease, unspecified: Secondary | ICD-10-CM | POA: Diagnosis not present

## 2016-10-07 DIAGNOSIS — K219 Gastro-esophageal reflux disease without esophagitis: Secondary | ICD-10-CM | POA: Diagnosis not present

## 2016-10-07 LAB — CBC WITH DIFFERENTIAL/PLATELET
Basophils Absolute: 0 10*3/uL (ref 0.0–0.2)
Basos: 0 %
EOS (ABSOLUTE): 0.4 10*3/uL (ref 0.0–0.4)
Eos: 3 %
Hematocrit: 39.6 % (ref 34.0–46.6)
Hemoglobin: 12.5 g/dL (ref 11.1–15.9)
Immature Grans (Abs): 0 10*3/uL (ref 0.0–0.1)
Immature Granulocytes: 0 %
Lymphocytes Absolute: 2.7 10*3/uL (ref 0.7–3.1)
Lymphs: 23 %
MCH: 29.3 pg (ref 26.6–33.0)
MCHC: 31.6 g/dL (ref 31.5–35.7)
MCV: 93 fL (ref 79–97)
Monocytes Absolute: 1.1 10*3/uL — ABNORMAL HIGH (ref 0.1–0.9)
Monocytes: 10 %
Neutrophils Absolute: 7.2 10*3/uL — ABNORMAL HIGH (ref 1.4–7.0)
Neutrophils: 64 %
Platelets: 288 10*3/uL (ref 150–379)
RBC: 4.27 x10E6/uL (ref 3.77–5.28)
RDW: 14.7 % (ref 12.3–15.4)
WBC: 11.5 10*3/uL — ABNORMAL HIGH (ref 3.4–10.8)

## 2016-10-07 LAB — CMP14+EGFR
ALT: 13 IU/L (ref 0–32)
AST: 21 IU/L (ref 0–40)
Albumin/Globulin Ratio: 0.9 — ABNORMAL LOW (ref 1.2–2.2)
Albumin: 3.7 g/dL (ref 3.5–4.8)
Alkaline Phosphatase: 60 IU/L (ref 39–117)
BUN/Creatinine Ratio: 11 — ABNORMAL LOW (ref 12–28)
BUN: 11 mg/dL (ref 8–27)
Bilirubin Total: 0.3 mg/dL (ref 0.0–1.2)
CO2: 35 mmol/L — ABNORMAL HIGH (ref 20–29)
Calcium: 9.8 mg/dL (ref 8.7–10.3)
Chloride: 94 mmol/L — ABNORMAL LOW (ref 96–106)
Creatinine, Ser: 0.99 mg/dL (ref 0.57–1.00)
GFR calc Af Amer: 64 mL/min/{1.73_m2} (ref >59)
GFR calc non Af Amer: 56 mL/min/{1.73_m2} — ABNORMAL LOW (ref >59)
Globulin, Total: 4.2 g/dL (ref 1.5–4.5)
Glucose: 109 mg/dL — ABNORMAL HIGH (ref 65–99)
Potassium: 4.9 mmol/L (ref 3.5–5.2)
Sodium: 142 mmol/L (ref 134–144)
Total Protein: 7.9 g/dL (ref 6.0–8.5)

## 2016-10-07 MED ORDER — METOPROLOL SUCCINATE ER 25 MG PO TB24: 25.0000 mg | ORAL_TABLET | Freq: Every day | ORAL | 1 refills | Status: DC

## 2016-10-07 NOTE — Progress Notes (Signed)
Home health called and pt's pulse is 48. Will cut her metorolol from 50 mg to 25 mg.

## 2016-10-08 DIAGNOSIS — K219 Gastro-esophageal reflux disease without esophagitis: Secondary | ICD-10-CM | POA: Diagnosis not present

## 2016-10-08 DIAGNOSIS — I272 Pulmonary hypertension, unspecified: Secondary | ICD-10-CM | POA: Diagnosis not present

## 2016-10-08 DIAGNOSIS — J849 Interstitial pulmonary disease, unspecified: Secondary | ICD-10-CM | POA: Diagnosis not present

## 2016-10-08 DIAGNOSIS — J8410 Pulmonary fibrosis, unspecified: Secondary | ICD-10-CM | POA: Diagnosis not present

## 2016-10-08 DIAGNOSIS — H3530 Unspecified macular degeneration: Secondary | ICD-10-CM | POA: Diagnosis not present

## 2016-10-08 DIAGNOSIS — I10 Essential (primary) hypertension: Secondary | ICD-10-CM | POA: Diagnosis not present

## 2016-10-10 ENCOUNTER — Other Ambulatory Visit: Payer: Self-pay | Admitting: Family

## 2016-10-12 ENCOUNTER — Other Ambulatory Visit: Payer: Self-pay | Admitting: Family

## 2016-10-14 ENCOUNTER — Telehealth: Payer: Self-pay | Admitting: *Deleted

## 2016-10-14 DIAGNOSIS — H3530 Unspecified macular degeneration: Secondary | ICD-10-CM | POA: Diagnosis not present

## 2016-10-14 DIAGNOSIS — J8410 Pulmonary fibrosis, unspecified: Secondary | ICD-10-CM | POA: Diagnosis not present

## 2016-10-14 DIAGNOSIS — I10 Essential (primary) hypertension: Secondary | ICD-10-CM | POA: Diagnosis not present

## 2016-10-14 DIAGNOSIS — I272 Pulmonary hypertension, unspecified: Secondary | ICD-10-CM | POA: Diagnosis not present

## 2016-10-14 DIAGNOSIS — J849 Interstitial pulmonary disease, unspecified: Secondary | ICD-10-CM | POA: Diagnosis not present

## 2016-10-14 DIAGNOSIS — K219 Gastro-esophageal reflux disease without esophagitis: Secondary | ICD-10-CM | POA: Diagnosis not present

## 2016-10-14 MED ORDER — UMECLIDINIUM BROMIDE 62.5 MCG/INH IN AEPB: 1.0000 | INHALATION_SPRAY | Freq: Every day | RESPIRATORY_TRACT | 1 refills | Status: DC

## 2016-10-14 NOTE — Telephone Encounter (Signed)
Incruse Prescription sent to pharmacy

## 2016-10-16 DIAGNOSIS — M25511 Pain in right shoulder: Secondary | ICD-10-CM | POA: Diagnosis not present

## 2016-10-16 DIAGNOSIS — I10 Essential (primary) hypertension: Secondary | ICD-10-CM | POA: Diagnosis not present

## 2016-10-16 DIAGNOSIS — M069 Rheumatoid arthritis, unspecified: Secondary | ICD-10-CM | POA: Diagnosis not present

## 2016-10-16 DIAGNOSIS — J8410 Pulmonary fibrosis, unspecified: Secondary | ICD-10-CM | POA: Diagnosis not present

## 2016-10-16 DIAGNOSIS — J9611 Chronic respiratory failure with hypoxia: Secondary | ICD-10-CM | POA: Diagnosis not present

## 2016-10-17 DIAGNOSIS — I272 Pulmonary hypertension, unspecified: Secondary | ICD-10-CM | POA: Diagnosis not present

## 2016-10-17 DIAGNOSIS — K219 Gastro-esophageal reflux disease without esophagitis: Secondary | ICD-10-CM | POA: Diagnosis not present

## 2016-10-17 DIAGNOSIS — J849 Interstitial pulmonary disease, unspecified: Secondary | ICD-10-CM | POA: Diagnosis not present

## 2016-10-17 DIAGNOSIS — H3530 Unspecified macular degeneration: Secondary | ICD-10-CM | POA: Diagnosis not present

## 2016-10-17 DIAGNOSIS — J8410 Pulmonary fibrosis, unspecified: Secondary | ICD-10-CM | POA: Diagnosis not present

## 2016-10-17 DIAGNOSIS — I10 Essential (primary) hypertension: Secondary | ICD-10-CM | POA: Diagnosis not present

## 2016-10-20 DIAGNOSIS — I10 Essential (primary) hypertension: Secondary | ICD-10-CM | POA: Diagnosis not present

## 2016-10-20 DIAGNOSIS — H3530 Unspecified macular degeneration: Secondary | ICD-10-CM | POA: Diagnosis not present

## 2016-10-20 DIAGNOSIS — I272 Pulmonary hypertension, unspecified: Secondary | ICD-10-CM | POA: Diagnosis not present

## 2016-10-20 DIAGNOSIS — J849 Interstitial pulmonary disease, unspecified: Secondary | ICD-10-CM | POA: Diagnosis not present

## 2016-10-20 DIAGNOSIS — J8410 Pulmonary fibrosis, unspecified: Secondary | ICD-10-CM | POA: Diagnosis not present

## 2016-10-20 DIAGNOSIS — K219 Gastro-esophageal reflux disease without esophagitis: Secondary | ICD-10-CM | POA: Diagnosis not present

## 2016-10-22 DIAGNOSIS — K219 Gastro-esophageal reflux disease without esophagitis: Secondary | ICD-10-CM | POA: Diagnosis not present

## 2016-10-22 DIAGNOSIS — I272 Pulmonary hypertension, unspecified: Secondary | ICD-10-CM | POA: Diagnosis not present

## 2016-10-22 DIAGNOSIS — J8410 Pulmonary fibrosis, unspecified: Secondary | ICD-10-CM | POA: Diagnosis not present

## 2016-10-22 DIAGNOSIS — H3530 Unspecified macular degeneration: Secondary | ICD-10-CM | POA: Diagnosis not present

## 2016-10-22 DIAGNOSIS — J849 Interstitial pulmonary disease, unspecified: Secondary | ICD-10-CM | POA: Diagnosis not present

## 2016-10-22 DIAGNOSIS — I10 Essential (primary) hypertension: Secondary | ICD-10-CM | POA: Diagnosis not present

## 2016-10-24 ENCOUNTER — Telehealth: Payer: Self-pay | Admitting: Family

## 2016-10-24 MED ORDER — CIPROFLOXACIN HCL 500 MG PO TABS: 500.0000 mg | ORAL_TABLET | Freq: Two times a day (BID) | ORAL | 0 refills | Status: DC

## 2016-10-24 NOTE — Telephone Encounter (Signed)
Pt aware.

## 2016-10-24 NOTE — Telephone Encounter (Signed)
Cipro Prescription sent to pharmacy   

## 2016-11-04 ENCOUNTER — Telehealth: Payer: Self-pay

## 2016-11-04 DIAGNOSIS — H3530 Unspecified macular degeneration: Secondary | ICD-10-CM | POA: Diagnosis not present

## 2016-11-04 DIAGNOSIS — J849 Interstitial pulmonary disease, unspecified: Secondary | ICD-10-CM | POA: Diagnosis not present

## 2016-11-04 DIAGNOSIS — I272 Pulmonary hypertension, unspecified: Secondary | ICD-10-CM | POA: Diagnosis not present

## 2016-11-04 DIAGNOSIS — K219 Gastro-esophageal reflux disease without esophagitis: Secondary | ICD-10-CM | POA: Diagnosis not present

## 2016-11-04 DIAGNOSIS — J8410 Pulmonary fibrosis, unspecified: Secondary | ICD-10-CM | POA: Diagnosis not present

## 2016-11-04 DIAGNOSIS — I10 Essential (primary) hypertension: Secondary | ICD-10-CM | POA: Diagnosis not present

## 2016-11-04 MED ORDER — TIOTROPIUM BROMIDE MONOHYDRATE 18 MCG IN CAPS: 18.0000 ug | ORAL_CAPSULE | Freq: Every day | RESPIRATORY_TRACT | 12 refills | Status: DC

## 2016-11-04 NOTE — Telephone Encounter (Signed)
Pt notified of RX Verbalizes understanding 

## 2016-11-04 NOTE — Telephone Encounter (Signed)
Medicaid non preferred Incruse Ellipta   Preferred are Atrovent HFA  Spireva handihaler

## 2016-11-04 NOTE — Telephone Encounter (Signed)
Insurance denied Alben Deeds Prescription sent to pharmacy

## 2016-11-10 ENCOUNTER — Inpatient Hospital Stay (HOSPITAL_COMMUNITY)
Admission: EM | Admit: 2016-11-10 | Discharge: 2016-11-14 | DRG: 196 | Disposition: A | Discharge status: Home Health | Payer: Medicare Other | Attending: Internal Medicine | Admitting: Internal Medicine

## 2016-11-10 ENCOUNTER — Encounter: Payer: Self-pay | Admitting: Family

## 2016-11-10 ENCOUNTER — Emergency Department (HOSPITAL_COMMUNITY): Payer: Medicare Other

## 2016-11-10 ENCOUNTER — Encounter (HOSPITAL_COMMUNITY): Payer: Self-pay

## 2016-11-10 ENCOUNTER — Ambulatory Visit (INDEPENDENT_AMBULATORY_CARE_PROVIDER_SITE_OTHER): Payer: Medicare Other | Admitting: Family

## 2016-11-10 VITALS — BP 126/85 | HR 133 | Temp 97.4°F

## 2016-11-10 DIAGNOSIS — I272 Pulmonary hypertension, unspecified: Secondary | ICD-10-CM | POA: Diagnosis not present

## 2016-11-10 DIAGNOSIS — F329 Major depressive disorder, single episode, unspecified: Secondary | ICD-10-CM | POA: Diagnosis present

## 2016-11-10 DIAGNOSIS — J849 Interstitial pulmonary disease, unspecified: Principal | ICD-10-CM | POA: Diagnosis present

## 2016-11-10 DIAGNOSIS — I10 Essential (primary) hypertension: Secondary | ICD-10-CM | POA: Diagnosis present

## 2016-11-10 DIAGNOSIS — I130 Hypertensive heart and chronic kidney disease with heart failure and stage 1 through stage 4 chronic kidney disease, or unspecified chronic kidney disease: Secondary | ICD-10-CM | POA: Diagnosis present

## 2016-11-10 DIAGNOSIS — E559 Vitamin D deficiency, unspecified: Secondary | ICD-10-CM | POA: Diagnosis present

## 2016-11-10 DIAGNOSIS — R609 Edema, unspecified: Secondary | ICD-10-CM

## 2016-11-10 DIAGNOSIS — I5031 Acute diastolic (congestive) heart failure: Secondary | ICD-10-CM | POA: Diagnosis present

## 2016-11-10 DIAGNOSIS — K219 Gastro-esophageal reflux disease without esophagitis: Secondary | ICD-10-CM | POA: Diagnosis present

## 2016-11-10 DIAGNOSIS — R000 Tachycardia, unspecified: Secondary | ICD-10-CM

## 2016-11-10 DIAGNOSIS — E1122 Type 2 diabetes mellitus with diabetic chronic kidney disease: Secondary | ICD-10-CM | POA: Diagnosis present

## 2016-11-10 DIAGNOSIS — F419 Anxiety disorder, unspecified: Secondary | ICD-10-CM | POA: Diagnosis present

## 2016-11-10 DIAGNOSIS — Z9981 Dependence on supplemental oxygen: Secondary | ICD-10-CM

## 2016-11-10 DIAGNOSIS — I5033 Acute on chronic diastolic (congestive) heart failure: Secondary | ICD-10-CM | POA: Diagnosis present

## 2016-11-10 DIAGNOSIS — R531 Weakness: Secondary | ICD-10-CM | POA: Diagnosis not present

## 2016-11-10 DIAGNOSIS — J449 Chronic obstructive pulmonary disease, unspecified: Secondary | ICD-10-CM | POA: Diagnosis present

## 2016-11-10 DIAGNOSIS — I509 Heart failure, unspecified: Secondary | ICD-10-CM | POA: Diagnosis not present

## 2016-11-10 DIAGNOSIS — Z905 Acquired absence of kidney: Secondary | ICD-10-CM | POA: Diagnosis not present

## 2016-11-10 DIAGNOSIS — E669 Obesity, unspecified: Secondary | ICD-10-CM | POA: Diagnosis not present

## 2016-11-10 DIAGNOSIS — Z7952 Long term (current) use of systemic steroids: Secondary | ICD-10-CM

## 2016-11-10 DIAGNOSIS — E039 Hypothyroidism, unspecified: Secondary | ICD-10-CM | POA: Diagnosis present

## 2016-11-10 DIAGNOSIS — R002 Palpitations: Secondary | ICD-10-CM | POA: Diagnosis not present

## 2016-11-10 DIAGNOSIS — E785 Hyperlipidemia, unspecified: Secondary | ICD-10-CM | POA: Diagnosis present

## 2016-11-10 DIAGNOSIS — M069 Rheumatoid arthritis, unspecified: Secondary | ICD-10-CM | POA: Diagnosis present

## 2016-11-10 DIAGNOSIS — I110 Hypertensive heart disease with heart failure: Secondary | ICD-10-CM | POA: Diagnosis not present

## 2016-11-10 DIAGNOSIS — Z9889 Other specified postprocedural states: Secondary | ICD-10-CM | POA: Diagnosis not present

## 2016-11-10 DIAGNOSIS — K5900 Constipation, unspecified: Secondary | ICD-10-CM | POA: Diagnosis not present

## 2016-11-10 DIAGNOSIS — N182 Chronic kidney disease, stage 2 (mild): Secondary | ICD-10-CM | POA: Diagnosis present

## 2016-11-10 DIAGNOSIS — Z888 Allergy status to other drugs, medicaments and biological substances status: Secondary | ICD-10-CM

## 2016-11-10 DIAGNOSIS — I4891 Unspecified atrial fibrillation: Secondary | ICD-10-CM | POA: Diagnosis not present

## 2016-11-10 DIAGNOSIS — Z6837 Body mass index (BMI) 37.0-37.9, adult: Secondary | ICD-10-CM

## 2016-11-10 DIAGNOSIS — E1159 Type 2 diabetes mellitus with other circulatory complications: Secondary | ICD-10-CM | POA: Diagnosis present

## 2016-11-10 DIAGNOSIS — J8410 Pulmonary fibrosis, unspecified: Secondary | ICD-10-CM | POA: Diagnosis not present

## 2016-11-10 DIAGNOSIS — I152 Hypertension secondary to endocrine disorders: Secondary | ICD-10-CM | POA: Diagnosis present

## 2016-11-10 DIAGNOSIS — J9621 Acute and chronic respiratory failure with hypoxia: Secondary | ICD-10-CM | POA: Diagnosis present

## 2016-11-10 DIAGNOSIS — R0602 Shortness of breath: Secondary | ICD-10-CM | POA: Diagnosis not present

## 2016-11-10 DIAGNOSIS — H3530 Unspecified macular degeneration: Secondary | ICD-10-CM | POA: Diagnosis present

## 2016-11-10 DIAGNOSIS — E038 Other specified hypothyroidism: Secondary | ICD-10-CM | POA: Diagnosis not present

## 2016-11-10 HISTORY — DX: Acute diastolic (congestive) heart failure: I50.31

## 2016-11-10 LAB — URINALYSIS, ROUTINE W REFLEX MICROSCOPIC
Bilirubin Urine: NEGATIVE
Glucose, UA: NEGATIVE mg/dL
Hgb urine dipstick: NEGATIVE
Ketones, ur: NEGATIVE mg/dL
Leukocytes, UA: NEGATIVE
Nitrite: NEGATIVE
Protein, ur: NEGATIVE mg/dL
Specific Gravity, Urine: 1.004 — ABNORMAL LOW (ref 1.005–1.030)
pH: 7 (ref 5.0–8.0)

## 2016-11-10 LAB — CBC WITH DIFFERENTIAL/PLATELET
Basophils Absolute: 0 10*3/uL (ref 0.0–0.1)
Basophils Relative: 0 %
Eosinophils Absolute: 0.1 10*3/uL (ref 0.0–0.7)
Eosinophils Relative: 1 %
HCT: 39.2 % (ref 36.0–46.0)
Hemoglobin: 12 g/dL (ref 12.0–15.0)
Lymphocytes Relative: 19 %
Lymphs Abs: 2.1 10*3/uL (ref 0.7–4.0)
MCH: 29.6 pg (ref 26.0–34.0)
MCHC: 30.6 g/dL (ref 30.0–36.0)
MCV: 96.8 fL (ref 78.0–100.0)
Monocytes Absolute: 0.8 10*3/uL (ref 0.1–1.0)
Monocytes Relative: 7 %
Neutro Abs: 8.1 10*3/uL — ABNORMAL HIGH (ref 1.7–7.7)
Neutrophils Relative %: 73 %
Platelets: 210 10*3/uL (ref 150–400)
RBC: 4.05 MIL/uL (ref 3.87–5.11)
RDW: 15.3 % (ref 11.5–15.5)
WBC: 11.1 10*3/uL — ABNORMAL HIGH (ref 4.0–10.5)

## 2016-11-10 LAB — BRAIN NATRIURETIC PEPTIDE: B Natriuretic Peptide: 607 pg/mL — ABNORMAL HIGH (ref 0.0–100.0)

## 2016-11-10 LAB — BASIC METABOLIC PANEL
Anion gap: 8 (ref 5–15)
BUN: 14 mg/dL (ref 6–20)
CO2: 33 mmol/L — ABNORMAL HIGH (ref 22–32)
Calcium: 9.5 mg/dL (ref 8.9–10.3)
Chloride: 99 mmol/L — ABNORMAL LOW (ref 101–111)
Creatinine, Ser: 1.09 mg/dL — ABNORMAL HIGH (ref 0.44–1.00)
GFR calc Af Amer: 56 mL/min — ABNORMAL LOW (ref >60)
GFR calc non Af Amer: 48 mL/min — ABNORMAL LOW (ref >60)
Glucose, Bld: 110 mg/dL — ABNORMAL HIGH (ref 65–99)
Potassium: 5.1 mmol/L (ref 3.5–5.1)
Sodium: 140 mmol/L (ref 135–145)

## 2016-11-10 LAB — TROPONIN I: Troponin I: <0.03 ng/mL (ref <0.03)

## 2016-11-10 LAB — APTT: aPTT: 73 seconds — ABNORMAL HIGH (ref 24–36)

## 2016-11-10 LAB — PROTIME-INR
INR: 1.21
Prothrombin Time: 15.4 seconds — ABNORMAL HIGH (ref 11.4–15.2)

## 2016-11-10 MED ORDER — DEXTROSE 5 % IV SOLN
5.0000 mg/h | INTRAVENOUS | Status: DC
  Administered 2016-11-10: 5 mg/h via INTRAVENOUS
  Administered 2016-11-11: 10 mg/h via INTRAVENOUS
  Administered 2016-11-11: 12.5 mg/h via INTRAVENOUS
  Administered 2016-11-11: 10 mg/h via INTRAVENOUS
  Administered 2016-11-11: 15 mg/h via INTRAVENOUS
  Filled 2016-11-10 (×4): qty 100

## 2016-11-10 MED ORDER — DILTIAZEM LOAD VIA INFUSION
10.0000 mg | Freq: Once | INTRAVENOUS | Status: AC
  Administered 2016-11-10: 10 mg via INTRAVENOUS
  Filled 2016-11-10: qty 10

## 2016-11-10 MED ORDER — DILTIAZEM HCL 100 MG IV SOLR
INTRAVENOUS | Status: AC
  Filled 2016-11-10: qty 100

## 2016-11-10 MED ORDER — FUROSEMIDE 10 MG/ML IJ SOLN
40.0000 mg | Freq: Once | INTRAMUSCULAR | Status: AC
Start: 2016-11-10 — End: 2016-11-10
  Administered 2016-11-10: 40 mg via INTRAVENOUS
  Filled 2016-11-10: qty 4

## 2016-11-10 MED ORDER — HEPARIN (PORCINE) IN NACL 100-0.45 UNIT/ML-% IJ SOLN
1400.0000 [IU]/h | INTRAMUSCULAR | Status: DC
  Administered 2016-11-10 – 2016-11-11 (×2): 1400 [IU]/h via INTRAVENOUS
  Filled 2016-11-10 (×2): qty 250

## 2016-11-10 MED ORDER — DILTIAZEM HCL 25 MG/5ML IV SOLN
15.0000 mg | Freq: Once | INTRAVENOUS | Status: AC
  Administered 2016-11-10: 15 mg via INTRAVENOUS

## 2016-11-10 MED ORDER — HEPARIN BOLUS VIA INFUSION
3000.0000 [IU] | Freq: Once | INTRAVENOUS | Status: AC
  Administered 2016-11-10: 3000 [IU] via INTRAVENOUS

## 2016-11-10 NOTE — ED Notes (Signed)
Pt provided clean catch urine specimen without in and out catheter.

## 2016-11-10 NOTE — Patient Instructions (Signed)
Atrial Fibrillation Atrial fibrillation is a type of irregular or rapid heartbeat (arrhythmia). In atrial fibrillation, the heart quivers continuously in a chaotic pattern. This occurs when parts of the heart receive disorganized signals that make the heart unable to pump blood normally. This can increase the risk for stroke, heart failure, and other heart-related conditions. There are different types of atrial fibrillation, including:  Paroxysmal atrial fibrillation. This type starts suddenly, and it usually stops on its own shortly after it starts.  Persistent atrial fibrillation. This type often lasts longer than a week. It may stop on its own or with treatment.  Long-lasting persistent atrial fibrillation. This type lasts longer than 12 months.  Permanent atrial fibrillation. This type does not go away.  Talk with your health care provider to learn about the type of atrial fibrillation that you have. What are the causes? This condition is caused by some heart-related conditions or procedures, including:  A heart attack.  Coronary artery disease.  Heart failure.  Heart valve conditions.  High blood pressure.  Inflammation of the sac that surrounds the heart (pericarditis).  Heart surgery.  Certain heart rhythm disorders, such as Wolf-Parkinson-White syndrome.  Other causes include:  Pneumonia.  Obstructive sleep apnea.  Blockage of an artery in the lungs (pulmonary embolism, or PE).  Lung cancer.  Chronic lung disease.  Thyroid problems, especially if the thyroid is overactive (hyperthyroidism).  Caffeine.  Excessive alcohol use or illegal drug use.  Use of some medicines, including certain decongestants and diet pills.  Sometimes, the cause cannot be found. What increases the risk? This condition is more likely to develop in:  People who are older in age.  People who smoke.  People who have diabetes mellitus.  People who are overweight  (obese).  Athletes who exercise vigorously.  What are the signs or symptoms? Symptoms of this condition include:  A feeling that your heart is beating rapidly or irregularly.  A feeling of discomfort or pain in your chest.  Shortness of breath.  Sudden light-headedness or weakness.  Getting tired easily during exercise.  In some cases, there are no symptoms. How is this diagnosed? Your health care provider may be able to detect atrial fibrillation when taking your pulse. If detected, this condition may be diagnosed with:  An electrocardiogram (ECG).  A Holter monitor test that records your heartbeat patterns over a 24-hour period.  Transthoracic echocardiogram (TTE) to evaluate how blood flows through your heart.  Transesophageal echocardiogram (TEE) to view more detailed images of your heart.  A stress test.  Imaging tests, such as a CT scan or chest X-ray.  Blood tests.  How is this treated? The main goals of treatment are to prevent blood clots from forming and to keep your heart beating at a normal rate and rhythm. The type of treatment that you receive depends on many factors, such as your underlying medical conditions and how you feel when you are experiencing atrial fibrillation. This condition may be treated with:  Medicine to slow down the heart rate, bring the heart's rhythm back to normal, or prevent clots from forming.  Electrical cardioversion. This is a procedure that resets your heart's rhythm by delivering a controlled, low-energy shock to the heart through your skin.  Different types of ablation, such as catheter ablation, catheter ablation with pacemaker, or surgical ablation. These procedures destroy the heart tissues that send abnormal signals. When the pacemaker is used, it is placed under your skin to help your heart beat in   a regular rhythm.  Follow these instructions at home:  Take over-the counter and prescription medicines only as told by your  health care provider.  If your health care provider prescribed a blood-thinning medicine (anticoagulant), take it exactly as told. Taking too much blood-thinning medicine can cause bleeding. If you do not take enough blood-thinning medicine, you will not have the protection that you need against stroke and other problems.  Do not use tobacco products, including cigarettes, chewing tobacco, and e-cigarettes. If you need help quitting, ask your health care provider.  If you have obstructive sleep apnea, manage your condition as told by your health care provider.  Do not drink alcohol.  Do not drink beverages that contain caffeine, such as coffee, soda, and tea.  Maintain a healthy weight. Do not use diet pills unless your health care provider approves. Diet pills may make heart problems worse.  Follow diet instructions as told by your health care provider.  Exercise regularly as told by your health care provider.  Keep all follow-up visits as told by your health care provider. This is important. How is this prevented?  Avoid drinking beverages that contain caffeine or alcohol.  Avoid certain medicines, especially medicines that are used for breathing problems.  Avoid certain herbs and herbal medicines, such as those that contain ephedra or ginseng.  Do not use illegal drugs, such as cocaine and amphetamines.  Do not smoke.  Manage your high blood pressure. Contact a health care provider if:  You notice a change in the rate, rhythm, or strength of your heartbeat.  You are taking an anticoagulant and you notice increased bruising.  You tire more easily when you exercise or exert yourself. Get help right away if:  You have chest pain, abdominal pain, sweating, or weakness.  You feel nauseous.  You notice blood in your vomit, bowel movement, or urine.  You have shortness of breath.  You suddenly have swollen feet and ankles.  You feel dizzy.  You have sudden weakness or  numbness of the face, arm, or leg, especially on one side of the body.  You have trouble speaking, trouble understanding, or both (aphasia).  Your face or your eyelid droops on one side. These symptoms may represent a serious problem that is an emergency. Do not wait to see if the symptoms will go away. Get medical help right away. Call your local emergency services (911 in the [place] Do not drive yourself to the hospital. This information is not intended to replace advice given to you by your health care provider. Make sure you discuss any questions you have with your health care provider. Document Released: 04/07/2005 Document Revised: 08/15/2015 Document Reviewed: 08/02/2014 Elsevier Interactive Patient Education  2017 [employer] Inc.  

## 2016-11-10 NOTE — ED Triage Notes (Addendum)
Reports of shortness of breath and tachycardia since yesterday. Also reports of bilateral foot pain/headache.

## 2016-11-10 NOTE — Progress Notes (Signed)
ANTICOAGULATION CONSULT NOTE - Initial Consult  Pharmacy Consult for Heparin Indication: atrial fibrillation  Allergies  Allergen Reactions  . Allopurinol   . Mobic [Meloxicam] Swelling and Other (See Comments)    Leg swelling    Patient Measurements: Height: 5\' 8"  (172.7 cm) Weight: 238 lb (108 kg) IBW/kg (Calculated) : 63.9 HEPARIN DW (KG): 88.3   Vital Signs: Temp: 97.6 F (36.4 C) (07/23 1728) Temp Source: Oral (07/23 1728) BP: 112/94 (07/23 1938) Pulse Rate: 128 (07/23 1952)  Labs:  Recent Labs  11/10/16 1745 11/10/16 1900  HGB 12.0  --   HCT 39.2  --   PLT 210  --   CREATININE  --  1.09*  TROPONINI  --  <0.03    Estimated Creatinine Clearance: 56.5 mL/min (A) (by C-G formula based on SCr of 1.09 mg/dL (H)).   Medical History: Past Medical History:  Diagnosis Date  . Allergic rhinitis   . Anxiety   . Anxiety   . Depression   . Diabetes mellitus    pre-diabetic  . Diverticulosis   . GERD (gastroesophageal reflux disease)   . Hyperlipidemia   . Hypertension   . Hypothyroidism   . Lung fibrosis ([place])   . Macular degeneration   . Rheumatoid arthritis(714.0)   . Urticaria     Medications:   (Not in a hospital admission) Reviewed - current home med list pending.  Assessment: Okay for Protocol, baseline anticoag labs pending.  Goal of Therapy:  Heparin level 0.3-0.7 units/ml Monitor platelets by anticoagulation protocol: Yes   Plan:  Give 3000 units bolus x 1 Start heparin infusion at 1400 units/hr Check anti-Xa level in 6-8 hours and daily while on heparin Continue to monitor H&H and platelets  Pricilla Larsson 11/10/2016,8:23 PM

## 2016-11-10 NOTE — ED Notes (Signed)
Additional 5 mg done (bolus from bag)

## 2016-11-10 NOTE — ED Notes (Signed)
ED Provider at bedside. 

## 2016-11-10 NOTE — H&P (Signed)
History and Physical    Lorraine Leonard LOV:[id number] DOB: [birth year] DOA: 11/10/2016  PCP: Sharion Balloon, FNP  Patient coming from: PCP office.    Chief Complaint:   SOB, weakness, and pedal edema.    HPI: Lorraine Leonard is an 76 y.o. female with hx of pulmonary fibrosis, on chronic steroids, hx of possible RA, macular degenration, depression, HTN, HLD, DM, hypothyrodism, went to her PCP for weakness, and SOB, found to be in afib with RVR and sent to the ER.  EKG confirmed new onset of afib, rate 140.  CXR showed CHF and right pleural effusion.  Her Cr was 1.0, K of 5.1, no leukocytosis.  She was started on IV cardiazem with bolus and drip, and IV heparin.  Hospitalist was asked to admit her for new onset of afib, CHADS of 5, and CHF. There is no suspicion of PE.  She has no chest pain.   ED Course:  See above.  Rewiew of Systems:  Constitutional: Negative for malaise, fever and chills. No significant weight loss or weight gain Eyes: Negative for eye pain, redness and discharge, diplopia, visual changes, or flashes of light. ENMT: Negative for ear pain, hoarseness, nasal congestion, sinus pressure and sore throat. No headaches; tinnitus, drooling, or problem swallowing. Cardiovascular: Negative for chest pain, palpitations, diaphoresis,   No orthopnea, PND Respiratory: Negative for cough, hemoptysis, wheezing and stridor. No pleuritic chestpain. Gastrointestinal: Negative for diarrhea, constipation,  melena, blood in stool, hematemesis, jaundice and rectal bleeding.    Genitourinary: Negative for frequency, dysuria, incontinence,flank pain and hematuria; Musculoskeletal: Negative for back pain and neck pain. Negative for swelling and trauma.;  Skin: . Negative for pruritus, rash, abrasions, bruising and skin lesion.; ulcerations Neuro: Negative for headache, lightheadedness and neck stiffness. Negative for weakness, altered level of consciousness , altered mental status, extremity weakness,  burning feet, involuntary movement, seizure and syncope.  Psych: negative for anxiety, depression, insomnia, tearfulness, panic attacks, hallucinations, paranoia, suicidal or homicidal ideation    Past Medical History:  Diagnosis Date  . Allergic rhinitis   . Anxiety   . Anxiety   . Depression   . Diabetes mellitus    pre-diabetic  . Diverticulosis   . GERD (gastroesophageal reflux disease)   . Hyperlipidemia   . Hypertension   . Hypothyroidism   . Lung fibrosis ([place])   . Macular degeneration   . Rheumatoid arthritis(714.0)   . Urticaria     Past Surgical History:  Procedure Laterality Date  . EUS N/A 10/03/2015   Procedure: UPPER ENDOSCOPIC ULTRASOUND (EUS) RADIAL;  Surgeon: Arta Silence, MD;  Location: [facility] ENDOSCOPY;  Service: Endoscopy;  Laterality: N/A;  . FINE NEEDLE ASPIRATION N/A 10/03/2015   Procedure: FINE NEEDLE ASPIRATION (FNA) RADIAL;  Surgeon: Arta Silence, MD;  Location: [facility] ENDOSCOPY;  Service: Endoscopy;  Laterality: N/A;  . 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 LUMBAR DISC SURGERY     x 2  . TUBAL LIGATION    . VAGINAL HYSTERECTOMY       reports that she has never smoked. She has never used smokeless tobacco. She reports that she does not drink alcohol or use drugs.  Allergies  Allergen Reactions  . Allopurinol   . Mobic [Meloxicam] Swelling and Other (See Comments)    Leg swelling    Family History  Problem Relation Age of Onset  . Diabetes Unknown   . Hypertension Unknown   . Coronary artery disease Unknown   .  Stroke Unknown   . Asthma Unknown   . Kidney disease Sister   . Lung cancer Sister   . Lung cancer Sister   . Suicidality Son        committed suicide in 2009  . Hyperlipidemia Unknown   . Anxiety disorder Unknown   . Migraines Unknown      Prior to Admission medications   Medication Sig Start Date End Date Taking? Authorizing Provider  albuterol (PROVENTIL HFA;VENTOLIN HFA) 108 (90 Base) MCG/ACT inhaler Inhale 2  puffs into the lungs every 6 (six) hours as needed for wheezing or shortness of breath. 09/25/16   Orson Eva, MD  aspirin EC 81 MG tablet Take 81 mg by mouth daily.    [provider]  azelastine (ASTELIN) 0.1 % nasal spray PLACE 2 SPRAYS INTO BOTH NOSTRILS 2 (TWO) TIMES DAILY AS NEEDED. Patient taking differently: PLACE 2 SPRAYS INTO BOTH NOSTRILS AT BEDTIME 11/20/15   Hawks, Alyse Low A, FNP  calcium citrate (CALCITRATE - DOSED IN MG ELEMENTAL CALCIUM) 950 MG tablet Take 1 tablet by mouth 2 (two) times daily.     [provider]  cholecalciferol (VITAMIN D) 400 units TABS tablet Take 400 Units by mouth daily.    [provider]  conjugated estrogens (PREMARIN) vaginal cream Place 0.5 g vaginally every Thursday. Use as directed    [provider]  cyanocobalamin 500 MCG tablet Take 500 mcg by mouth daily.     [provider]  DEXILANT 60 MG capsule TAKE 1 CAPSULE (60 MG TOTAL) BY MOUTH DAILY. 05/14/16   Evelina Dun A, FNP  diazepam (VALIUM) 5 MG tablet TAKE 1 TABLET TWICE A DAY AS NEEDED FOR ANXIETY Patient taking differently: Take 5 mg by mouth 2 (two) times daily as needed for anxiety.  07/17/16   Evelina Dun A, FNP  EPINEPHrine 0.3 mg/0.3 mL IJ SOAJ injection Inject 0.3 mg into the muscle once as needed (For anaphylaxis.).    [provider]  fluticasone (FLONASE) 50 MCG/ACT nasal spray PLACE 1 SPRAY INTO BOTH NOSTRILS 2 (TWO) TIMES DAILY AS NEEDED FOR ALLERGIES OR RHINITIS. Patient taking differently: Place 1 spray into both nostrils 2 (two) times daily.  06/16/16   Hawks, Christy A, FNP  INCRUSE ELLIPTA 62.5 MCG/INH AEPB TAKE 1 PUFF BY MOUTH EVERY DAY 10/31/16   [provider]  levocetirizine (XYZAL) 5 MG tablet Take 5 mg by mouth at bedtime.  07/16/15   [provider]  levothyroxine (SYNTHROID, LEVOTHROID) 100 MCG tablet TAKE 1 TABLET (100 MCG TOTAL) BY MOUTH DAILY BEFORE BREAKFAST. 10/13/16   Evelina Dun A, FNP    metoprolol succinate (TOPROL XL) 25 MG 24 hr tablet Take 1 tablet (25 mg total) by mouth daily. 10/07/16 10/07/17  Sharion Balloon, FNP  mometasone (ELOCON) 0.1 % cream APPLY TO AFFECTED AREA DAILY 08/12/16   Evelina Dun A, FNP  ondansetron (ZOFRAN) 8 MG tablet Take 8 mg by mouth every 8 (eight) hours as needed for nausea or vomiting.    [provider]  OVER THE COUNTER MEDICATION Place 1 drop into both eyes as needed (For dry eyes.). Systane Gel Drops    [provider]  PATADAY 0.2 % SOLN Place 1 drop into both eyes daily. 06/10/12   [provider]  polyethylene glycol powder (GLYCOLAX/MIRALAX) powder TAKE 17 G BY MOUTH 2 (TWO) TIMES DAILY AS NEEDED. 02/01/16   Evelina Dun A, FNP  predniSONE (DELTASONE) 10 MG tablet Take 10 mg by mouth daily.  10/16/16   [provider]  PROAIR HFA 108 (90 Base) MCG/ACT inhaler INHALE 2 PUFFS INTO THE LUNGS EVERY 6 (SIX) HOURS AS NEEDED FOR WHEEZING. 09/13/15   Evelina Dun A, FNP  simvastatin (ZOCOR) 40 MG tablet TAKE 1 TABLET (40 MG TOTAL) BY MOUTH AT BEDTIME. 09/30/16   Hawks, Alyse Low A, FNP  tiotropium (SPIRIVA HANDIHALER) 18 MCG inhalation capsule Place 1 capsule (18 mcg total) into inhaler and inhale daily. 11/04/16   Evelina Dun A, FNP  traMADol (ULTRAM) 50 MG tablet TAKE 1 TABLET BY MOUTH EVERY 6 HOURS AS NEEDED Patient taking differently: TAKE 1 TABLET BY MOUTH EVERY 6 HOURS AS NEEDED FOR PAIN 12/03/15   Evelina Dun A, FNP  trimethoprim (TRIMPEX) 100 MG tablet Take 100 mg by mouth daily.    [provider]  VIGAMOX 0.5 % ophthalmic solution Apply 1 drop to eye as needed (Instill into affected eye four times daily for 2 days after each eye injection for macular degeneration.).  08/30/15   [provider]  vitamin C (ASCORBIC ACID) 500 MG tablet Take 500 mg by mouth daily.    [provider]  Vitamin D, Ergocalciferol, (DRISDOL) 50000 units CAPS capsule TAKE 1 CAPSULE (50,000 UNITS TOTAL)  BY MOUTH EVERY 7 (SEVEN) DAYS. 09/30/16   Sharion Balloon, FNP  vitamin E 400 UNIT capsule Take 400 Units by mouth daily.    [provider]    Physical Exam: Vitals:   11/10/16 2023 11/10/16 2024 11/10/16 2026 11/10/16 2030  BP:    124/74  Pulse: 79 (!) 111 (!) 107 (!) 119  Resp: 20 17 (!) 22 (!) 24  Temp:      TempSrc:      SpO2: 94% 94% 92% 92%  Weight:      Height:          Constitutional: NAD, calm, comfortable Vitals:   11/10/16 2023 11/10/16 2024 11/10/16 2026 11/10/16 2030  BP:    124/74  Pulse: 79 (!) 111 (!) 107 (!) 119  Resp: 20 17 (!) 22 (!) 24  Temp:      TempSrc:      SpO2: 94% 94% 92% 92%  Weight:      Height:       Eyes: PERRL, lids and conjunctivae normal ENMT: Mucous membranes are moist. Posterior pharynx clear of any exudate or lesions.Normal dentition.  Neck: normal, supple, no masses, no thyromegaly Respiratory: clear to auscultation bilaterally, no wheezing, some crackles. Normal respiratory effort. No accessory muscle use.  Cardiovascular: Regular rate and rhythm, no murmurs / rubs / gallops. No extremity edema. 2+ pedal pulses. No carotid bruits.  Abdomen: no tenderness, no masses palpated. No hepatosplenomegaly. Bowel sounds positive.  Musculoskeletal: no clubbing / cyanosis. No joint deformity upper and lower extremities. Good ROM, no contractures. Normal muscle tone. 1-2 plus edema.  Skin: no rashes, lesions, ulcers. No induration Neurologic: CN 2-12 grossly intact. Sensation intact, DTR normal. Strength 5/5 in all 4.  Psychiatric: Normal judgment and insight. Alert and oriented x 3. Normal mood.    Labs on Admission: I have personally reviewed following labs and imaging studies  CBC:  Recent Labs Lab 11/10/16 1745  WBC 11.1*  NEUTROABS 8.1*  HGB 12.0  HCT 39.2  MCV 96.8  PLT 161   Basic Metabolic Panel:  Recent Labs Lab 11/10/16 1900  NA 140  K 5.1  CL 99*  CO2 33*  GLUCOSE 110*  BUN 14  CREATININE 1.09*    CALCIUM  9.5   GFR: Cardiac Enzymes:  Recent Labs Lab 11/10/16 1900  TROPONINI <0.03   Urine analysis:    Component Value Date/Time   COLORURINE COLORLESS (A) 11/10/2016 1748   APPEARANCEUR CLEAR 11/10/2016 1748   LABSPEC 1.004 (L) 11/10/2016 1748   PHURINE 7.0 11/10/2016 1748   GLUCOSEU NEGATIVE 11/10/2016 1748   HGBUR NEGATIVE 11/10/2016 1748   BILIRUBINUR NEGATIVE 11/10/2016 1748   BILIRUBINUR neg 04/06/2015 1517   KETONESUR NEGATIVE 11/10/2016 1748   PROTEINUR NEGATIVE 11/10/2016 1748   UROBILINOGEN negative 04/06/2015 1517   UROBILINOGEN 0.2 05/14/2011 1942   NITRITE NEGATIVE 11/10/2016 1748   LEUKOCYTESUR NEGATIVE 11/10/2016 1748   Radiological Exams on Admission: Dg Chest Port 1 View  Result Date: 11/10/2016 CLINICAL DATA:  Shortness of breath and tachycardia for 2 days, initial encounter EXAM: PORTABLE CHEST 1 VIEW COMPARISON:  09/23/2016 FINDINGS: Cardiac shadow is mildly enlarged. Mild vascular congestion is noted new from the prior exam. Small bilateral pleural effusions are noted right greater than left with likely underlying atelectasis. No bony abnormality is seen. IMPRESSION: Changes consistent with CHF and prominent right pleural effusion. Electronically Signed   By: Inez Catalina M.D.   On: 11/10/2016 17:54    EKG: Independently reviewed.   Assessment/Plan Active Problems:   Essential hypertension   Hypothyroidism   Vitamin D deficiency   Single kidney   Pulmonary hypertension (HCC)   Morbid obesity (HCC)   Diabetes mellitus ([place])   Pulmonary fibrosis (HCC)   Atrial fibrillation with RVR ([place])   PLAN:   New onset of afib with RVR:  No evidence of PE.  CHADS of 5.  Will continue with anticoagulation.  No contraindication from her history.  Continue with IV Cardiazem.  Will increase her Toprol to 50mg  per day.  Make NPO and consult cardiology to see if cardioversion is an option.  Obtain cardiac ECHO.  Check TSH.  She uses no alcohol, but does require  nebulizer.  Change to Xopenex.   COPD:  Continue with prednisone (chronic), use Xopenex.  No active wheezing.   Hypothyroidism:  Will check TSH.  Continue with supplement.   CHF:  Obtain ECHO.  Give IV Lasix BID.   DM:  Will check CBGs.  SSI as required.  Note NPO after midnight.    DVT prophylaxis: IV Heparin.  Code Status: FULL CODE.  Family Communication: husband, grand daughter at bedside.  Disposition Plan: Home.  Consults called: None.  Admission status: inpatient.    Tajon Moring MD FACP. [facility]  If 7PM-7AM, please contact night-coverage [email or web address] Password [email or web address]  11/10/2016, 9:01 PM

## 2016-11-10 NOTE — ED Notes (Signed)
Cardizem bolus of an additional 10 mg given from bag per verbal orders given by Dr. Thurnell Garbe.

## 2016-11-10 NOTE — ED Provider Notes (Signed)
[facility] DEPT Provider Note   CSN: [id number] Arrival date & time: 11/10/16  1719     History   Chief Complaint Chief Complaint  Patient presents with  . Shortness of Breath    HPI Lorraine Leonard is a 76 y.o. female.   Pt was seen at 1730. Per pt, c/o gradual onset and worsening of persistent SOB and palpitations for the past 3 to 4 days, worse today. Has been associated with increasing pedal edema. Denies CP, no cough, no abd pain, no back pain, no N/V/D, no fevers, no calf pain/unilateral swelling.   Past Medical History:  Diagnosis Date  . Allergic rhinitis   . Anxiety   . Anxiety   . Depression   . Diabetes mellitus    pre-diabetic  . Diverticulosis   . GERD (gastroesophageal reflux disease)   . Hyperlipidemia   . Hypertension   . Hypothyroidism   . Lung fibrosis ([place])   . Macular degeneration   . Rheumatoid arthritis(714.0)   . Urticaria     Patient Active Problem List   Diagnosis Date Noted  . Pneumonia 09/23/2016  . Pulmonary fibrosis ([place]) 09/23/2016  . Acute respiratory failure with hypoxia ([place]) 09/23/2016  . Diabetes mellitus ([place]) 07/17/2016  . Morbid obesity ([place]) 04/18/2016  . Snoring 11/09/2015  . Pulmonary hypertension ([place]) 11/09/2015  . Gout 10/19/2015  . Chronic urticaria 03/24/2015  . Metabolic syndrome 35/36/1443  . Single kidney 03/01/2015  . Macular degeneration of both eyes 11/28/2014  . Constipation 11/28/2014  . Chronic rhinitis 11/17/2014  . Hypertensive retinopathy 10/30/2014  . Vitamin D deficiency 08/17/2014  . Arthritis of knee 08/17/2014  . Hyperlipemia 08/17/2014  . Depression   . Hypothyroidism   . Anxiety   . Cardiomegaly 05/05/2011  . Essential hypertension 02/08/2007  . 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 GASTROESOPHAGEAL REFLUX DISEASE 02/08/2007    Past Surgical History:  Procedure Laterality Date  . EUS N/A 10/03/2015   Procedure: UPPER ENDOSCOPIC ULTRASOUND (EUS) RADIAL;   Surgeon: Arta Silence, MD;  Location: [facility] ENDOSCOPY;  Service: Endoscopy;  Laterality: N/A;  . FINE NEEDLE ASPIRATION N/A 10/03/2015   Procedure: FINE NEEDLE ASPIRATION (FNA) RADIAL;  Surgeon: Arta Silence, MD;  Location: [facility] ENDOSCOPY;  Service: Endoscopy;  Laterality: N/A;  . 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 LUMBAR DISC SURGERY     x 2  . TUBAL LIGATION    . VAGINAL HYSTERECTOMY      OB History    No data available       Home Medications    Prior to Admission medications   Medication Sig Start Date End Date Taking? Authorizing Provider  albuterol (PROVENTIL HFA;VENTOLIN HFA) 108 (90 Base) MCG/ACT inhaler Inhale 2 puffs into the lungs every 6 (six) hours as needed for wheezing or shortness of breath. 09/25/16   Orson Eva, MD  aspirin EC 81 MG tablet Take 81 mg by mouth daily.    [provider]  azelastine (ASTELIN) 0.1 % nasal spray PLACE 2 SPRAYS INTO BOTH NOSTRILS 2 (TWO) TIMES DAILY AS NEEDED. Patient taking differently: PLACE 2 SPRAYS INTO BOTH NOSTRILS AT BEDTIME 11/20/15   Hawks, Alyse Low A, FNP  calcium citrate (CALCITRATE - DOSED IN MG ELEMENTAL CALCIUM) 950 MG tablet Take 1 tablet by mouth 2 (two) times daily.     [provider]  cholecalciferol (VITAMIN D) 400 units TABS tablet Take 400 Units by mouth daily.  [provider]  conjugated estrogens (PREMARIN) vaginal cream Place 0.5 g vaginally every Thursday. Use as directed    [provider]  cyanocobalamin 500 MCG tablet Take 500 mcg by mouth daily.     [provider]  DEXILANT 60 MG capsule TAKE 1 CAPSULE (60 MG TOTAL) BY MOUTH DAILY. 05/14/16   Evelina Dun A, FNP  diazepam (VALIUM) 5 MG tablet TAKE 1 TABLET TWICE A DAY AS NEEDED FOR ANXIETY Patient taking differently: Take 5 mg by mouth 2 (two) times daily as needed for anxiety.  07/17/16   Evelina Dun A, FNP  EPINEPHrine 0.3 mg/0.3 mL IJ SOAJ injection Inject 0.3 mg into the muscle once as needed (For  anaphylaxis.).    [provider]  fluticasone (FLONASE) 50 MCG/ACT nasal spray PLACE 1 SPRAY INTO BOTH NOSTRILS 2 (TWO) TIMES DAILY AS NEEDED FOR ALLERGIES OR RHINITIS. Patient taking differently: Place 1 spray into both nostrils 2 (two) times daily.  06/16/16   Hawks, Christy A, FNP  INCRUSE ELLIPTA 62.5 MCG/INH AEPB TAKE 1 PUFF BY MOUTH EVERY DAY 10/31/16   [provider]  levocetirizine (XYZAL) 5 MG tablet Take 5 mg by mouth at bedtime.  07/16/15   [provider]  levothyroxine (SYNTHROID, LEVOTHROID) 100 MCG tablet TAKE 1 TABLET (100 MCG TOTAL) BY MOUTH DAILY BEFORE BREAKFAST. 10/13/16   Evelina Dun A, FNP  metoprolol succinate (TOPROL XL) 25 MG 24 hr tablet Take 1 tablet (25 mg total) by mouth daily. 10/07/16 10/07/17  Sharion Balloon, FNP  mometasone (ELOCON) 0.1 % cream APPLY TO AFFECTED AREA DAILY 08/12/16   Evelina Dun A, FNP  ondansetron (ZOFRAN) 8 MG tablet Take 8 mg by mouth every 8 (eight) hours as needed for nausea or vomiting.    [provider]  OVER THE COUNTER MEDICATION Place 1 drop into both eyes as needed (For dry eyes.). Systane Gel Drops    [provider]  PATADAY 0.2 % SOLN Place 1 drop into both eyes daily. 06/10/12   [provider]  polyethylene glycol powder (GLYCOLAX/MIRALAX) powder TAKE 17 G BY MOUTH 2 (TWO) TIMES DAILY AS NEEDED. 02/01/16   Evelina Dun A, FNP  predniSONE (DELTASONE) 10 MG tablet Take 10 mg by mouth daily. 10/16/16   [provider]  PROAIR HFA 108 (90 Base) MCG/ACT inhaler INHALE 2 PUFFS INTO THE LUNGS EVERY 6 (SIX) HOURS AS NEEDED FOR WHEEZING. 09/13/15   Evelina Dun A, FNP  simvastatin (ZOCOR) 40 MG tablet TAKE 1 TABLET (40 MG TOTAL) BY MOUTH AT BEDTIME. 09/30/16   Hawks, Alyse Low A, FNP  tiotropium (SPIRIVA HANDIHALER) 18 MCG inhalation capsule Place 1 capsule (18 mcg total) into inhaler and inhale daily. 11/04/16   Evelina Dun A, FNP  traMADol (ULTRAM) 50 MG tablet TAKE 1 TABLET  BY MOUTH EVERY 6 HOURS AS NEEDED Patient taking differently: TAKE 1 TABLET BY MOUTH EVERY 6 HOURS AS NEEDED FOR PAIN 12/03/15   Evelina Dun A, FNP  trimethoprim (TRIMPEX) 100 MG tablet Take 100 mg by mouth daily.    [provider]  VIGAMOX 0.5 % ophthalmic solution Apply 1 drop to eye as needed (Instill into affected eye four times daily for 2 days after each eye injection for macular degeneration.).  08/30/15   [provider]  vitamin C (ASCORBIC ACID) 500 MG tablet Take 500 mg by mouth daily.    [provider]  Vitamin D, Ergocalciferol, (DRISDOL) 50000 units CAPS capsule TAKE 1 CAPSULE (50,000 UNITS TOTAL)  BY MOUTH EVERY 7 (SEVEN) DAYS. 09/30/16   Sharion Balloon, FNP  vitamin E 400 UNIT capsule Take 400 Units by mouth daily.    [provider]    Family History Family History  Problem Relation Age of Onset  . Diabetes Unknown   . Hypertension Unknown   . Coronary artery disease Unknown   . Stroke Unknown   . Asthma Unknown   . Kidney disease Sister   . Lung cancer Sister   . Lung cancer Sister   . Suicidality Son        committed suicide in 2009  . Hyperlipidemia Unknown   . Anxiety disorder Unknown   . Migraines Unknown     Social History Social History  Substance Use Topics  . Smoking status: Never Smoker  . Smokeless tobacco: Never Used  . Alcohol use No     Allergies   Allopurinol and Mobic [meloxicam]   Review of Systems Review of Systems ROS: Statement: All systems negative except as marked or noted in the HPI; Constitutional: Negative for fever and chills. ; ; Eyes: Negative for eye pain, redness and discharge. ; ; ENMT: Negative for ear pain, hoarseness, nasal congestion, sinus pressure and sore throat. ; ; Cardiovascular: Negative for chest pain, diaphoresis. +palpitations, dyspnea and peripheral edema. ; ; Respiratory: Negative for cough, wheezing and stridor. ; ; Gastrointestinal: Negative for nausea, vomiting, diarrhea,  abdominal pain, blood in stool, hematemesis, jaundice and rectal bleeding. . ; ; Genitourinary: Negative for dysuria, flank pain and hematuria. ; ; Musculoskeletal: Negative for back pain and neck pain. Negative for swelling and trauma.; ; Skin: Negative for pruritus, rash, abrasions, blisters, bruising and skin lesion.; ; Neuro: Negative for headache, lightheadedness and neck stiffness. Negative for weakness, altered level of consciousness, altered mental status, extremity weakness, paresthesias, involuntary movement, seizure and syncope.       Physical Exam Updated Vital Signs BP 137/87 Comment: Simultaneous filing. User may not have seen previous data.  Pulse (!) 131   Temp 97.6 F (36.4 C) (Oral)   Resp 19   Ht 5\' 8"  (1.727 m)   Wt 108 kg (238 lb)   SpO2 97%   BMI 36.19 kg/m   Patient Vitals for the past 24 hrs:  BP Temp Temp src Pulse Resp SpO2 Height Weight  11/10/16 1938 (!) 112/94 - - (!) 126 (!) 22 98 % - -  11/10/16 1935 - - - (!) 129 (!) 24 95 % - -  11/10/16 1932 - - - (!) 134 19 95 % - -  11/10/16 1930 - - - (!) 118 (!) 23 94 % - -  11/10/16 1916 - - - (!) 108 18 96 % - -  11/10/16 1911 - - - (!) 118 (!) 25 96 % - -  11/10/16 1850 - - - (!) 141 (!) 23 92 % - -  11/10/16 1845 - - - (!) 134 (!) 24 96 % - -  11/10/16 1830 - - - (!) 119 (!) 22 96 % - -  11/10/16 1815 - - - (!) 134 (!) 24 97 % - -  11/10/16 1800 (!) 125/91 - - (!) 129 19 93 % - -  11/10/16 1740 - - - (!) 131 19 97 % - -  11/10/16 1734 - - - (!) 146 (!) 23 100 % - -  11/10/16 1731 137/87 - - (!) 146 (!) 22 99 % - -  11/10/16 1730 - - - (!) 139  18 100 % - -  11/10/16 1729 - - - - - - 5\' 8"  (1.727 m) 108 kg (238 lb)  11/10/16 1728 - 97.6 F (36.4 C) Oral - (!) 22 - - -      Physical Exam 1735: Physical examination:  Nursing notes reviewed; Vital signs and O2 SAT reviewed;  Constitutional: Well developed, Well nourished, Well hydrated, Uncomfortable appearing.; Head:  Normocephalic, atraumatic; Eyes:  EOMI, PERRL, No scleral icterus; ENMT: Mouth and pharynx normal, Mucous membranes moist; Neck: Supple, Full range of motion, No lymphadenopathy; Cardiovascular: Tachycardic rate and irregular irregular rhythm, No gallop; Respiratory: Breath sounds coarse & equal bilaterally, No wheezes.  Tachypneic. Speaking full sentences, sitting upright.; Chest: Nontender, Movement normal; Abdomen: Soft, Nontender, Nondistended, Normal bowel sounds; Genitourinary: No CVA tenderness; Extremities: Pulses normal, No tenderness, +2 pedal edema bilat. No calf asymmetry.; Neuro: AA&Ox3, Major CN grossly intact.  Speech clear. No gross focal motor or sensory deficits in extremities.; Skin: Color normal, Warm, Dry.   ED Treatments / Results  Labs (all labs ordered are listed, but only abnormal results are displayed)   EKG  EKG Interpretation  Date/Time:  Monday November 10 2016 17:30:30 EDT Ventricular Rate:  137 PR Interval:    QRS Duration: 77 QT Interval:  294 QTC Calculation: 444 R Axis:   112 Text Interpretation:  Atrial fibrillation with rapid ventricular response Anteroseptal infarct, age indeterminate Baseline wander When compared with ECG of 09/23/2016 Atrial fibrillation has replaced Normal sinus rhythm Confirmed by [facility]  MD, Nunzio Cory [id number]) on 11/10/2016 5:52:09 PM       Radiology   Procedures Procedures (including critical care time)  Medications Ordered in ED Medications - No data to display   Initial Impression / Assessment and Plan / ED Course  I have reviewed the triage vital signs and the nursing notes.  Pertinent labs & imaging results that were available during my care of the patient were reviewed by me and considered in my medical decision making (see chart for details).  MDM Reviewed: previous chart, nursing note and vitals Reviewed previous: labs and ECG Interpretation: labs, ECG and x-ray Total time providing critical care: 30-74 minutes. This excludes time spent performing  separately reportable procedures and services. Consults: admitting MD   CRITICAL CARE Performed by: Alfonzo Feller Total critical care time: 35 minutes Critical care time was exclusive of separately billable procedures and treating other patients. Critical care was necessary to treat or prevent imminent or life-threatening deterioration. Critical care was time spent personally by me on the following activities: development of treatment plan with patient and/or surrogate as well as nursing, discussions with consultants, evaluation of patient's response to treatment, examination of patient, obtaining history from patient or surrogate, ordering and performing treatments and interventions, ordering and review of laboratory studies, ordering and review of radiographic studies, pulse oximetry and re-evaluation of patient's condition.   Results for orders placed or performed during the hospital encounter of 29/51/88  Basic metabolic panel  Result Value Ref Range   Sodium 140 135 - 145 mmol/L   Potassium 5.1 3.5 - 5.1 mmol/L   Chloride 99 (L) 101 - 111 mmol/L   CO2 33 (H) 22 - 32 mmol/L   Glucose, Bld 110 (H) 65 - 99 mg/dL   BUN 14 6 - 20 mg/dL   Creatinine, Ser 1.09 (H) 0.44 - 1.00 mg/dL   Calcium 9.5 8.9 - 10.3 mg/dL   GFR calc non Af Amer 48 (L) >60 mL/min   GFR calc Af  Amer 56 (L) >60 mL/min   Anion gap 8 5 - 15  Brain natriuretic peptide  Result Value Ref Range   B Natriuretic Peptide 607.0 (H) 0.0 - 100.0 pg/mL  Troponin I  Result Value Ref Range   Troponin I <0.03 <0.03 ng/mL  CBC with Differential  Result Value Ref Range   WBC 11.1 (H) 4.0 - 10.5 K/uL   RBC 4.05 3.87 - 5.11 MIL/uL   Hemoglobin 12.0 12.0 - 15.0 g/dL   HCT 39.2 36.0 - 46.0 %   MCV 96.8 78.0 - 100.0 fL   MCH 29.6 26.0 - 34.0 pg   MCHC 30.6 30.0 - 36.0 g/dL   RDW 15.3 11.5 - 15.5 %   Platelets 210 150 - 400 K/uL   Neutrophils Relative % 73 %   Neutro Abs 8.1 (H) 1.7 - 7.7 K/uL   Lymphocytes Relative 19 %     Lymphs Abs 2.1 0.7 - 4.0 K/uL   Monocytes Relative 7 %   Monocytes Absolute 0.8 0.1 - 1.0 K/uL   Eosinophils Relative 1 %   Eosinophils Absolute 0.1 0.0 - 0.7 K/uL   Basophils Relative 0 %   Basophils Absolute 0.0 0.0 - 0.1 K/uL    Dg Chest Port 1 View Result Date: 11/10/2016 CLINICAL DATA:  Shortness of breath and tachycardia for 2 days, initial encounter EXAM: PORTABLE CHEST 1 VIEW COMPARISON:  09/23/2016 FINDINGS: Cardiac shadow is mildly enlarged. Mild vascular congestion is noted new from the prior exam. Small bilateral pleural effusions are noted right greater than left with likely underlying atelectasis. No bony abnormality is seen. IMPRESSION: Changes consistent with CHF and prominent right pleural effusion. Electronically Signed   By: Inez Catalina M.D.   On: 11/10/2016 17:54    CHA2DS2/VAS Stroke Risk Points      6 >= 2 Points: High Risk     1 - 1.99 Points: Medium Risk     0 Points: Low Risk    The patient's score has not changed in the past year.:  No Change        Details:  Note: External data might be a factor in metrics not marked with  Points Metrics  This score determines the patient's risk of having a stroke if the patient has atrial fibrillation.       1 Has Congestive Heart Failure:  Yes   0 Has Vascular Disease:  No   1 Has Hypertension:  Yes   2 Age:  34   1 Has Diabetes:  Yes   0 Had Stroke:  No Had TIA:  No Had thromboembolism:  No   1 Female:  Yes      2000:  On arrival, monitor afib/RVR with rates 130-150's. IV cardizem bolus and gtt given with HR improving to 110-130's. Will re-bolus IV cardizem.  CHA2DS2-VASc score 6: will start IV heparin.  BNP elevated and CXR with CHF; IV lasix given. Dx and testing d/w pt.  Questions answered.  Verb understanding, agreeable to admit. T/C to [facility]. [place] Comment, case discussed, including:  HPI, pertinent PM/SHx, VS/PE, dx testing, ED course and treatment:  Agreeable to admit.     Final Clinical Impressions(s) / ED  Diagnoses   Final diagnoses:  None    New Prescriptions New Prescriptions   No medications on file      Francine Graven, DO 11/14/16 9211

## 2016-11-10 NOTE — Progress Notes (Signed)
   Subjective:    Patient ID: Lorraine Leonard, female    DOB: Jan 30, 1941, 76 y.o.   MRN: [id number]  Pt presents to the office today with complaints of peripheral edema, palpitations, and weakness. PT states this started on Saturday and has become worse. PT is on continuous oxygen 3 L and is followed by Pulmonologist's for Pulmonary Hypertension and Pulmonary fibrosis.  Pt states her SOB has not changed, but does feel anxious.  Headache   Associated symptoms include weakness.      Review of Systems  Constitutional: Positive for fatigue.  Cardiovascular: Positive for palpitations and leg swelling. Negative for chest pain.  Neurological: Positive for weakness and headaches.  Psychiatric/Behavioral: The patient is nervous/anxious.   All other systems reviewed and are negative.      Objective:   Physical Exam  Constitutional: She is oriented to person, place, and time. She appears well-developed and well-nourished. No distress.  HENT:  Head: Normocephalic.  Eyes: Pupils are equal, round, and reactive to light.  Neck: Normal range of motion. Neck supple. No thyromegaly present.  Cardiovascular: Normal heart sounds and intact distal pulses.  An irregular rhythm present. Tachycardia present.   No murmur heard. Pulmonary/Chest: Effort normal. No respiratory distress. She has decreased breath sounds. She has no wheezes.  Abdominal: Soft. Bowel sounds are normal. She exhibits no distension. There is no tenderness.  Musculoskeletal: Normal range of motion. She exhibits edema (3+ in bilateral feet). She exhibits no tenderness.  Generalized weakness, pt in wheelchair  Neurological: She is alert and oriented to person, place, and time.  Skin: Skin is warm and dry. Ecchymosis (left foot) noted.  Psychiatric: She has a normal mood and affect. Her behavior is normal. Judgment and thought content normal.  Vitals reviewed.  EKG- A Fib   BP 126/85   Pulse (!) 133   Temp (!) 97.4 F (36.3 C)  (Oral)   SpO2 98% Comment: 3L O2     Assessment & Plan:  1. Atrial fibrillation, unspecified type (HCC)  2. Palpitations - EKG 12-Lead  3. Tachycardia - EKG 12-Lead  4. Weakness - EKG 12-Lead  5. Peripheral edema - EKG 12-Lead Bruising present on left foot- Possible DVT?  Edema present bilaterally though less likely DVT  New onset of A Fib- Will send to ED Peripheral edema Weakness O2- 98%- stable    Evelina Dun, FNP

## 2016-11-11 ENCOUNTER — Encounter (HOSPITAL_COMMUNITY): Payer: Self-pay | Admitting: *Deleted

## 2016-11-11 DIAGNOSIS — I5031 Acute diastolic (congestive) heart failure: Secondary | ICD-10-CM | POA: Diagnosis present

## 2016-11-11 DIAGNOSIS — I272 Pulmonary hypertension, unspecified: Secondary | ICD-10-CM

## 2016-11-11 DIAGNOSIS — N182 Chronic kidney disease, stage 2 (mild): Secondary | ICD-10-CM | POA: Diagnosis present

## 2016-11-11 DIAGNOSIS — J9621 Acute and chronic respiratory failure with hypoxia: Secondary | ICD-10-CM | POA: Diagnosis present

## 2016-11-11 DIAGNOSIS — I4891 Unspecified atrial fibrillation: Secondary | ICD-10-CM

## 2016-11-11 DIAGNOSIS — Z905 Acquired absence of kidney: Secondary | ICD-10-CM

## 2016-11-11 HISTORY — DX: Acute diastolic (congestive) heart failure: I50.31

## 2016-11-11 LAB — CBC
HCT: 38.4 % (ref 36.0–46.0)
Hemoglobin: 12 g/dL (ref 12.0–15.0)
MCH: 29.8 pg (ref 26.0–34.0)
MCHC: 31.3 g/dL (ref 30.0–36.0)
MCV: 95.3 fL (ref 78.0–100.0)
Platelets: 218 10*3/uL (ref 150–400)
RBC: 4.03 MIL/uL (ref 3.87–5.11)
RDW: 15.5 % (ref 11.5–15.5)
WBC: 10.9 10*3/uL — ABNORMAL HIGH (ref 4.0–10.5)

## 2016-11-11 LAB — COMPREHENSIVE METABOLIC PANEL
ALT: 32 U/L (ref 14–54)
AST: 27 U/L (ref 15–41)
Albumin: 3.6 g/dL (ref 3.5–5.0)
Alkaline Phosphatase: 56 U/L (ref 38–126)
Anion gap: 8 (ref 5–15)
BUN: 13 mg/dL (ref 6–20)
CO2: 36 mmol/L — ABNORMAL HIGH (ref 22–32)
Calcium: 9.2 mg/dL (ref 8.9–10.3)
Chloride: 98 mmol/L — ABNORMAL LOW (ref 101–111)
Creatinine, Ser: 0.97 mg/dL (ref 0.44–1.00)
GFR calc Af Amer: >60 mL/min (ref >60)
GFR calc non Af Amer: 55 mL/min — ABNORMAL LOW (ref >60)
Glucose, Bld: 104 mg/dL — ABNORMAL HIGH (ref 65–99)
Potassium: 3.8 mmol/L (ref 3.5–5.1)
Sodium: 142 mmol/L (ref 135–145)
Total Bilirubin: 0.7 mg/dL (ref 0.3–1.2)
Total Protein: 7.4 g/dL (ref 6.5–8.1)

## 2016-11-11 LAB — TSH: TSH: 2.402 u[IU]/mL (ref 0.350–4.500)

## 2016-11-11 LAB — HEPARIN LEVEL (UNFRACTIONATED): Heparin Unfractionated: 0.73 IU/mL — ABNORMAL HIGH (ref 0.30–0.70)

## 2016-11-11 LAB — MRSA PCR SCREENING: MRSA by PCR: NEGATIVE

## 2016-11-11 MED ORDER — TRIMETHOPRIM 100 MG PO TABS
100.0000 mg | ORAL_TABLET | Freq: Every day | ORAL | Status: DC
  Administered 2016-11-11 – 2016-11-14 (×4): 100 mg via ORAL
  Filled 2016-11-11 (×7): qty 1

## 2016-11-11 MED ORDER — OLOPATADINE HCL 0.1 % OP SOLN
1.0000 [drp] | Freq: Two times a day (BID) | OPHTHALMIC | Status: DC
  Administered 2016-11-11 – 2016-11-14 (×7): 1 [drp] via OPHTHALMIC
  Filled 2016-11-11: qty 5

## 2016-11-11 MED ORDER — TIOTROPIUM BROMIDE MONOHYDRATE 18 MCG IN CAPS
18.0000 ug | ORAL_CAPSULE | Freq: Every day | RESPIRATORY_TRACT | Status: DC
  Filled 2016-11-11: qty 5

## 2016-11-11 MED ORDER — PREDNISONE 10 MG PO TABS
10.0000 mg | ORAL_TABLET | Freq: Every day | ORAL | Status: DC
  Administered 2016-11-11 – 2016-11-14 (×4): 10 mg via ORAL
  Filled 2016-11-11 (×4): qty 1

## 2016-11-11 MED ORDER — FUROSEMIDE 10 MG/ML IJ SOLN
40.0000 mg | Freq: Two times a day (BID) | INTRAMUSCULAR | Status: DC
  Administered 2016-11-11 – 2016-11-13 (×5): 40 mg via INTRAVENOUS
  Filled 2016-11-11 (×5): qty 4

## 2016-11-11 MED ORDER — TRAMADOL HCL 50 MG PO TABS
50.0000 mg | ORAL_TABLET | Freq: Four times a day (QID) | ORAL | Status: DC | PRN
  Administered 2016-11-11: 50 mg via ORAL
  Filled 2016-11-11: qty 1

## 2016-11-11 MED ORDER — DIAZEPAM 5 MG PO TABS
5.0000 mg | ORAL_TABLET | Freq: Two times a day (BID) | ORAL | Status: DC | PRN
  Administered 2016-11-11 – 2016-11-13 (×4): 5 mg via ORAL
  Filled 2016-11-11 (×4): qty 1

## 2016-11-11 MED ORDER — PANTOPRAZOLE SODIUM 40 MG PO TBEC
40.0000 mg | DELAYED_RELEASE_TABLET | Freq: Every day | ORAL | Status: DC
  Administered 2016-11-11 – 2016-11-14 (×4): 40 mg via ORAL
  Filled 2016-11-11 (×4): qty 1

## 2016-11-11 MED ORDER — VITAMIN C 500 MG PO TABS
500.0000 mg | ORAL_TABLET | Freq: Every day | ORAL | Status: DC
  Administered 2016-11-11 – 2016-11-14 (×4): 500 mg via ORAL
  Filled 2016-11-11 (×5): qty 1

## 2016-11-11 MED ORDER — ONDANSETRON HCL 4 MG/2ML IJ SOLN: 4.0000 mg | Freq: Four times a day (QID) | INTRAMUSCULAR | Status: DC | PRN

## 2016-11-11 MED ORDER — FLUTICASONE PROPIONATE 50 MCG/ACT NA SUSP
1.0000 | Freq: Two times a day (BID) | NASAL | Status: DC
  Administered 2016-11-11 – 2016-11-14 (×6): 1 via NASAL
  Filled 2016-11-11: qty 16

## 2016-11-11 MED ORDER — CALCIUM CARBONATE 1250 (500 CA) MG PO TABS
625.0000 mg | ORAL_TABLET | Freq: Two times a day (BID) | ORAL | Status: DC
  Administered 2016-11-11 – 2016-11-14 (×7): 625 mg via ORAL
  Filled 2016-11-11 (×8): qty 1

## 2016-11-11 MED ORDER — APIXABAN 5 MG PO TABS
5.0000 mg | ORAL_TABLET | Freq: Two times a day (BID) | ORAL | Status: DC
  Administered 2016-11-11 – 2016-11-14 (×7): 5 mg via ORAL
  Filled 2016-11-11 (×7): qty 1

## 2016-11-11 MED ORDER — SIMVASTATIN 20 MG PO TABS
40.0000 mg | ORAL_TABLET | Freq: Every day | ORAL | Status: DC
  Administered 2016-11-11: 40 mg via ORAL
  Filled 2016-11-11: qty 2

## 2016-11-11 MED ORDER — ASPIRIN EC 81 MG PO TBEC
81.0000 mg | DELAYED_RELEASE_TABLET | Freq: Every day | ORAL | Status: DC
  Administered 2016-11-11: 81 mg via ORAL
  Filled 2016-11-11: qty 1

## 2016-11-11 MED ORDER — IPRATROPIUM-ALBUTEROL 0.5-2.5 (3) MG/3ML IN SOLN
3.0000 mL | Freq: Three times a day (TID) | RESPIRATORY_TRACT | Status: DC
  Administered 2016-11-11 (×2): 3 mL via RESPIRATORY_TRACT
  Filled 2016-11-11 (×2): qty 3

## 2016-11-11 MED ORDER — DIGOXIN 0.25 MG/ML IJ SOLN
0.5000 mg | Freq: Once | INTRAMUSCULAR | Status: AC
  Administered 2016-11-11: 0.5 mg via INTRAVENOUS
  Filled 2016-11-11: qty 2

## 2016-11-11 MED ORDER — ONDANSETRON HCL 4 MG PO TABS: 4.0000 mg | ORAL_TABLET | Freq: Four times a day (QID) | ORAL | Status: DC | PRN

## 2016-11-11 MED ORDER — LEVOTHYROXINE SODIUM 100 MCG PO TABS
100.0000 ug | ORAL_TABLET | Freq: Every day | ORAL | Status: DC
  Administered 2016-11-11 – 2016-11-14 (×4): 100 ug via ORAL
  Filled 2016-11-11 (×4): qty 1

## 2016-11-11 MED ORDER — ONDANSETRON HCL 4 MG PO TABS: 8.0000 mg | ORAL_TABLET | Freq: Three times a day (TID) | ORAL | Status: DC | PRN

## 2016-11-11 MED ORDER — DIGOXIN 0.25 MG/ML IJ SOLN
0.2500 mg | Freq: Four times a day (QID) | INTRAMUSCULAR | Status: AC
  Administered 2016-11-11 (×2): 0.25 mg via INTRAVENOUS
  Filled 2016-11-11 (×2): qty 2

## 2016-11-11 MED ORDER — VITAMIN D (ERGOCALCIFEROL) 1.25 MG (50000 UNIT) PO CAPS
50000.0000 [IU] | ORAL_CAPSULE | ORAL | Status: DC
  Administered 2016-11-11 – 2016-11-12 (×2): 50000 [IU] via ORAL
  Filled 2016-11-11 (×2): qty 1

## 2016-11-11 MED ORDER — CHOLECALCIFEROL 10 MCG (400 UNIT) PO TABS
400.0000 [IU] | ORAL_TABLET | Freq: Every day | ORAL | Status: DC
  Administered 2016-11-11 – 2016-11-14 (×4): 400 [IU] via ORAL
  Filled 2016-11-11 (×5): qty 1

## 2016-11-11 MED ORDER — METOPROLOL SUCCINATE ER 50 MG PO TB24
50.0000 mg | ORAL_TABLET | Freq: Every day | ORAL | Status: DC
  Administered 2016-11-11 – 2016-11-13 (×3): 50 mg via ORAL
  Filled 2016-11-11 (×3): qty 1

## 2016-11-11 MED ORDER — UMECLIDINIUM BROMIDE 62.5 MCG/INH IN AEPB
1.0000 | INHALATION_SPRAY | Freq: Every day | RESPIRATORY_TRACT | Status: DC
  Administered 2016-11-11 – 2016-11-14 (×4): 1 via RESPIRATORY_TRACT
  Filled 2016-11-11: qty 7

## 2016-11-11 MED ORDER — VITAMIN B-12 100 MCG PO TABS
500.0000 ug | ORAL_TABLET | Freq: Every day | ORAL | Status: DC
  Administered 2016-11-11 – 2016-11-13 (×3): 500 ug via ORAL
  Filled 2016-11-11 (×3): qty 1

## 2016-11-11 NOTE — Progress Notes (Signed)
ANTICOAGULATION CONSULT NOTE - follow up  Pharmacy Consult for Heparin Indication: atrial fibrillation  Allergies  Allergen Reactions  . Allopurinol   . Mobic [Meloxicam] Swelling and Other (See Comments)    Leg swelling   Patient Measurements: Height: 5\' 8"  (172.7 cm) Weight: 245 lb 9.5 oz (111.4 kg) IBW/kg (Calculated) : 63.9 HEPARIN DW (KG): 89.3   Vital Signs: Temp: 97.8 F (36.6 C) (07/24 0100) Temp Source: Oral (07/24 0100) BP: 137/101 (07/24 0700) Pulse Rate: 108 (07/24 0700)  Labs:  Recent Labs  11/10/16 1745 11/10/16 1900 11/10/16 2115 11/11/16 0539  HGB 12.0  --   --  12.0  HCT 39.2  --   --  38.4  PLT 210  --   --  218  APTT  --   --  73*  --   LABPROT  --   --  15.4*  --   INR  --   --  1.21  --   HEPARINUNFRC  --   --   --  0.73*  CREATININE  --  1.09*  --  0.97  TROPONINI  --  <0.03  --   --    Estimated Creatinine Clearance: 64.6 mL/min (by C-G formula based on SCr of 0.97 mg/dL).  Medical History: Past Medical History:  Diagnosis Date  . Acute diastolic CHF (congestive heart failure) ([place]) 11/11/2016  . Allergic rhinitis   . Anxiety   . Anxiety   . Depression   . Diabetes mellitus    pre-diabetic  . Diverticulosis   . GERD (gastroesophageal reflux disease)   . Hyperlipidemia   . Hypertension   . Hypothyroidism   . Lung fibrosis ([place])   . Macular degeneration   . Rheumatoid arthritis(714.0)   . Urticaria    Medications:  Prescriptions Prior to Admission  Medication Sig Dispense Refill Last Dose  . albuterol (PROVENTIL HFA;VENTOLIN HFA) 108 (90 Base) MCG/ACT inhaler Inhale 2 puffs into the lungs every 6 (six) hours as needed for wheezing or shortness of breath. 1 Inhaler 2 11/10/2016 at Unknown time  . aspirin EC 81 MG tablet Take 81 mg by mouth daily.   11/10/2016 at Unknown time  . azelastine (ASTELIN) 0.1 % nasal spray PLACE 2 SPRAYS INTO BOTH NOSTRILS 2 (TWO) TIMES DAILY AS NEEDED. (Patient taking differently: PLACE 2 SPRAYS INTO  BOTH NOSTRILS AT BEDTIME) 30 mL 4 11/09/2016 at Unknown time  . calcium citrate (CALCITRATE - DOSED IN MG ELEMENTAL CALCIUM) 950 MG tablet Take 1 tablet by mouth 2 (two) times daily.    11/10/2016 at Unknown time  . Cholecalciferol 2000 units CAPS Take 2,000 Units by mouth daily.    11/10/2016 at Unknown time  . conjugated estrogens (PREMARIN) vaginal cream Place 0.5 g vaginally every Thursday. Use as directed   11/06/2016 at Unknown time  . cyanocobalamin 500 MCG tablet Take 500 mcg by mouth daily.    11/10/2016 at Unknown time  . DEXILANT 60 MG capsule TAKE 1 CAPSULE (60 MG TOTAL) BY MOUTH DAILY. 30 capsule 5 11/10/2016 at Unknown time  . diazepam (VALIUM) 5 MG tablet TAKE 1 TABLET TWICE A DAY AS NEEDED FOR ANXIETY (Patient taking differently: Take 5 mg by mouth 2 (two) times daily as needed for anxiety. ) 60 tablet 5 11/10/2016 at Unknown time  . EPINEPHrine 0.3 mg/0.3 mL IJ SOAJ injection Inject 0.3 mg into the muscle once as needed (For anaphylaxis.).   unknown  . fluticasone (FLONASE) 50 MCG/ACT nasal spray PLACE 1 SPRAY INTO  BOTH NOSTRILS 2 (TWO) TIMES DAILY AS NEEDED FOR ALLERGIES OR RHINITIS. (Patient taking differently: Place 1 spray into both nostrils 2 (two) times daily. ) 16 g 5 11/10/2016 at Unknown time  . INCRUSE ELLIPTA 62.5 MCG/INH AEPB TAKE 1 PUFF BY MOUTH EVERY DAY  3 11/10/2016 at Unknown time  . levocetirizine (XYZAL) 5 MG tablet Take 5 mg by mouth at bedtime as needed for allergies.    Past Month at Unknown time  . levothyroxine (SYNTHROID, LEVOTHROID) 100 MCG tablet TAKE 1 TABLET (100 MCG TOTAL) BY MOUTH DAILY BEFORE BREAKFAST. 90 tablet 1 11/10/2016 at Unknown time  . metoprolol succinate (TOPROL XL) 25 MG 24 hr tablet Take 1 tablet (25 mg total) by mouth daily. 90 tablet 1 11/09/2016 at 1930  . ondansetron (ZOFRAN) 8 MG tablet Take 8 mg by mouth every 8 (eight) hours as needed for nausea or vomiting.   unknown  . OVER THE COUNTER MEDICATION Place 1 drop into both eyes as needed (For dry  eyes.). Systane Gel Drops   Past Month at Unknown time  . PATADAY 0.2 % SOLN Place 1 drop into both eyes daily.   11/10/2016 at Unknown time  . polyethylene glycol powder (GLYCOLAX/MIRALAX) powder TAKE 17 G BY MOUTH 2 (TWO) TIMES DAILY AS NEEDED. 3162 g 0 Past Week at Unknown time  . predniSONE (DELTASONE) 10 MG tablet Take 10 mg by mouth daily.  0 11/10/2016 at Unknown time  . simvastatin (ZOCOR) 40 MG tablet TAKE 1 TABLET (40 MG TOTAL) BY MOUTH AT BEDTIME. 30 tablet 3 11/09/2016 at Unknown time  . traMADol (ULTRAM) 50 MG tablet TAKE 1 TABLET BY MOUTH EVERY 6 HOURS AS NEEDED (Patient taking differently: TAKE 1 TABLET BY MOUTH EVERY 6 HOURS AS NEEDED FOR PAIN) 90 tablet 2 Past Week at Unknown time  . trimethoprim (TRIMPEX) 100 MG tablet Take 100 mg by mouth daily.   11/10/2016 at Unknown time  . VIGAMOX 0.5 % ophthalmic solution Apply 1 drop to eye as needed (Instill into affected eye four times daily for 2 days after each eye injection for macular degeneration.).   12 Past Month at Unknown time  . vitamin C (ASCORBIC ACID) 500 MG tablet Take 500 mg by mouth daily.   11/10/2016 at Unknown time  . Vitamin D, Ergocalciferol, (DRISDOL) 50000 units CAPS capsule TAKE 1 CAPSULE (50,000 UNITS TOTAL) BY MOUTH EVERY 7 (SEVEN) DAYS. 12 capsule 0 11/05/2016 at Unknown time  . vitamin E 400 UNIT capsule Take 400 Units by mouth daily.   11/10/2016 at Unknown time  . mometasone (ELOCON) 0.1 % cream APPLY TO AFFECTED AREA DAILY 45 g 2 Taking   Assessment: Okay for Protocol, baseline anticoag labs OK.  H/H appears stable.  Heparin level at upper end of range.  No bleeding reported.    Goal of Therapy:  Heparin level 0.3-0.7 units/ml Monitor platelets by anticoagulation protocol: Yes   Plan:   Continue Heparin infusion at 1400 units/hr  Recheck Heparin level in ~6 hrs to confirm stable then daily  CBC daily while on Heparin   Hart Robinsons A 11/11/2016,8:07 AM

## 2016-11-11 NOTE — Discharge Instructions (Signed)

## 2016-11-11 NOTE — Care Management Note (Signed)
Case Management Note  Patient Details  Name: Lorraine Leonard MRN: [id number] Date of Birth: [birth year]  Subjective/Objective:  Adm with CHF/new Afib. From home with husband, has RW and cane. She has home oxygen provided by [facility]. She has [place] RN and PT with [facility]. She will be discharged on Eliquis.                    Action/Plan: CM will place order for resumption of HH and given 30 day free voucher for Eliquis.    Expected Discharge Date:  11/14/16               Expected Discharge Plan:  [place]  In-House Referral:     Discharge planning Services  CM Consult  Post Acute Care Choice:    Choice offered to:     DME Arranged:    DME Agency:     HH Arranged:    [place] Agency:     Status of Service:  In process, will continue to follow  If discussed at Long Length of Stay Meetings, dates discussed:    Additional Comments:  Pape Parson, Chauncey Reading, RN 11/11/2016, 1:52 PM

## 2016-11-11 NOTE — Progress Notes (Signed)
ANTICOAGULATION CONSULT NOTE - follow up  Pharmacy Consult for Heparin >> ELIQUIS Indication: atrial fibrillation  Allergies  Allergen Reactions  . Allopurinol   . Mobic [Meloxicam] Swelling and Other (See Comments)    Leg swelling   Patient Measurements: Height: 5\' 8"  (172.7 cm) Weight: 245 lb 9.5 oz (111.4 kg) IBW/kg (Calculated) : 63.9 HEPARIN DW (KG): 89.3   Vital Signs: Temp: 97.6 F (36.4 C) (07/24 0900) Temp Source: Oral (07/24 0900) BP: 137/101 (07/24 0700) Pulse Rate: 108 (07/24 0700)  Labs:  Recent Labs  11/10/16 1745 11/10/16 1900 11/10/16 2115 11/11/16 0539  HGB 12.0  --   --  12.0  HCT 39.2  --   --  38.4  PLT 210  --   --  218  APTT  --   --  73*  --   LABPROT  --   --  15.4*  --   INR  --   --  1.21  --   HEPARINUNFRC  --   --   --  0.73*  CREATININE  --  1.09*  --  0.97  TROPONINI  --  <0.03  --   --    Estimated Creatinine Clearance: 64.6 mL/min (by C-G formula based on SCr of 0.97 mg/dL).  Medical History: Past Medical History:  Diagnosis Date  . Acute diastolic CHF (congestive heart failure) ([place]) 11/11/2016  . Allergic rhinitis   . Anxiety   . Anxiety   . Depression   . Diabetes mellitus    pre-diabetic  . Diverticulosis   . GERD (gastroesophageal reflux disease)   . Hyperlipidemia   . Hypertension   . Hypothyroidism   . Lung fibrosis ([place])   . Macular degeneration   . Rheumatoid arthritis(714.0)   . Urticaria    Medications:  Prescriptions Prior to Admission  Medication Sig Dispense Refill Last Dose  . albuterol (PROVENTIL HFA;VENTOLIN HFA) 108 (90 Base) MCG/ACT inhaler Inhale 2 puffs into the lungs every 6 (six) hours as needed for wheezing or shortness of breath. 1 Inhaler 2 11/10/2016 at Unknown time  . aspirin EC 81 MG tablet Take 81 mg by mouth daily.   11/10/2016 at Unknown time  . azelastine (ASTELIN) 0.1 % nasal spray PLACE 2 SPRAYS INTO BOTH NOSTRILS 2 (TWO) TIMES DAILY AS NEEDED. (Patient taking differently: PLACE 2  SPRAYS INTO BOTH NOSTRILS AT BEDTIME) 30 mL 4 11/09/2016 at Unknown time  . calcium citrate (CALCITRATE - DOSED IN MG ELEMENTAL CALCIUM) 950 MG tablet Take 1 tablet by mouth 2 (two) times daily.    11/10/2016 at Unknown time  . Cholecalciferol 2000 units CAPS Take 2,000 Units by mouth daily.    11/10/2016 at Unknown time  . conjugated estrogens (PREMARIN) vaginal cream Place 0.5 g vaginally every Thursday. Use as directed   11/06/2016 at Unknown time  . cyanocobalamin 500 MCG tablet Take 500 mcg by mouth daily.    11/10/2016 at Unknown time  . DEXILANT 60 MG capsule TAKE 1 CAPSULE (60 MG TOTAL) BY MOUTH DAILY. 30 capsule 5 11/10/2016 at Unknown time  . diazepam (VALIUM) 5 MG tablet TAKE 1 TABLET TWICE A DAY AS NEEDED FOR ANXIETY (Patient taking differently: Take 5 mg by mouth 2 (two) times daily as needed for anxiety. ) 60 tablet 5 11/10/2016 at Unknown time  . EPINEPHrine 0.3 mg/0.3 mL IJ SOAJ injection Inject 0.3 mg into the muscle once as needed (For anaphylaxis.).   unknown  . fluticasone (FLONASE) 50 MCG/ACT nasal spray PLACE 1  SPRAY INTO BOTH NOSTRILS 2 (TWO) TIMES DAILY AS NEEDED FOR ALLERGIES OR RHINITIS. (Patient taking differently: Place 1 spray into both nostrils 2 (two) times daily. ) 16 g 5 11/10/2016 at Unknown time  . INCRUSE ELLIPTA 62.5 MCG/INH AEPB TAKE 1 PUFF BY MOUTH EVERY DAY  3 11/10/2016 at Unknown time  . levocetirizine (XYZAL) 5 MG tablet Take 5 mg by mouth at bedtime as needed for allergies.    Past Month at Unknown time  . levothyroxine (SYNTHROID, LEVOTHROID) 100 MCG tablet TAKE 1 TABLET (100 MCG TOTAL) BY MOUTH DAILY BEFORE BREAKFAST. 90 tablet 1 11/10/2016 at Unknown time  . metoprolol succinate (TOPROL XL) 25 MG 24 hr tablet Take 1 tablet (25 mg total) by mouth daily. 90 tablet 1 11/09/2016 at 1930  . ondansetron (ZOFRAN) 8 MG tablet Take 8 mg by mouth every 8 (eight) hours as needed for nausea or vomiting.   unknown  . OVER THE COUNTER MEDICATION Place 1 drop into both eyes as  needed (For dry eyes.). Systane Gel Drops   Past Month at Unknown time  . PATADAY 0.2 % SOLN Place 1 drop into both eyes daily.   11/10/2016 at Unknown time  . polyethylene glycol powder (GLYCOLAX/MIRALAX) powder TAKE 17 G BY MOUTH 2 (TWO) TIMES DAILY AS NEEDED. 3162 g 0 Past Week at Unknown time  . predniSONE (DELTASONE) 10 MG tablet Take 10 mg by mouth daily.  0 11/10/2016 at Unknown time  . simvastatin (ZOCOR) 40 MG tablet TAKE 1 TABLET (40 MG TOTAL) BY MOUTH AT BEDTIME. 30 tablet 3 11/09/2016 at Unknown time  . traMADol (ULTRAM) 50 MG tablet TAKE 1 TABLET BY MOUTH EVERY 6 HOURS AS NEEDED (Patient taking differently: TAKE 1 TABLET BY MOUTH EVERY 6 HOURS AS NEEDED FOR PAIN) 90 tablet 2 Past Week at Unknown time  . trimethoprim (TRIMPEX) 100 MG tablet Take 100 mg by mouth daily.   11/10/2016 at Unknown time  . VIGAMOX 0.5 % ophthalmic solution Apply 1 drop to eye as needed (Instill into affected eye four times daily for 2 days after each eye injection for macular degeneration.).   12 Past Month at Unknown time  . vitamin C (ASCORBIC ACID) 500 MG tablet Take 500 mg by mouth daily.   11/10/2016 at Unknown time  . Vitamin D, Ergocalciferol, (DRISDOL) 50000 units CAPS capsule TAKE 1 CAPSULE (50,000 UNITS TOTAL) BY MOUTH EVERY 7 (SEVEN) DAYS. 12 capsule 0 11/05/2016 at Unknown time  . vitamin E 400 UNIT capsule Take 400 Units by mouth daily.   11/10/2016 at Unknown time  . mometasone (ELOCON) 0.1 % cream APPLY TO AFFECTED AREA DAILY 45 g 2 Taking   Assessment: Okay for Protocol, baseline anticoag labs OK.  H/H appears stable. No bleeding reported.   Asked to transition to Eliquis.  Goal of Therapy:  Heparin level 0.3-0.7 units/ml Monitor platelets by anticoagulation protocol: Yes   Plan:   Eliquis 5mg  po BID  Provide education / handout  Monitor for s/sx bleeding   Hart Robinsons A 11/11/2016,11:33 AM

## 2016-11-11 NOTE — Care Management Important Message (Signed)
Important Message  Patient Details  Name: Lorraine Leonard MRN: [id number] Date of Birth: 05-02-1940   Medicare Important Message Given:  Yes    Marcene Laskowski, Chauncey Reading, RN 11/11/2016, 1:59 PM

## 2016-11-11 NOTE — Progress Notes (Signed)
PROGRESS NOTE  Lorraine Leonard EXB:[id number] DOB: 01-Apr-1941 DOA: 11/10/2016 PCP: Sharion Balloon, FNP  Brief History:  76 year old female with a history of pulmonary fibrosis, chronic respiratory failure on 3 L at home, hypertension, impaired glucose tolerance, hypothyroidism, hyperlipidemia presented with one-week history of progressive shortness of breath and PND type symptoms.  Patient states that she has had 3-4 day history of worsening lower extremity edema. She has been having intermittent chest discomfort and palpitations over the past 3-4 days. She denies any dizziness, nausea, vomiting, diarrhea,  fevers, chills. She continues to have chronic nonproductive cough. The patient was recently discharged from the hospital after a stay from 09/23/2016 through 09/25/2016 secondary to progression/exacerbation of her underlying pulmonary interstitial lung disease. She was discharged home with 3 L nasal cannula at that time. CT chest angio  on 09/23/2016 was negative pulmonary embolus.    The patient had previously seen pulmonary medicine, Dr. Elie Goody to interstitial and nodular changes on CT in the left lower lobe. She had continue to follow Dr. Lamonte Sakai on an annual basis for her interstitial lung disease and history of rheumatoid arthritis. There wasa question whether the patient actually had rheumatoid arthritis. She followed up with rheumatology, Dr. Gavin Pound. Per the patient, she was told that "more or less, I did not have rheumatoid arthritis". Nevertheless, the patient saw her primary care provider on the day of admission, and she was found to have atrial fibrillation with RVR. She was sent to the emergency department for further evaluation.  Assessment/Plan: Acute on chronic respiratory failure with hypoxia -Secondary to CHF and pulmonary fibrosis/interstitial lung disease -Currently stable on 4 L nasal cannula -She is on baseline 3 L at home -Restart  bronchodilators -Wean oxygen as tolerated  Acute diastolic CHF -02/15/2535 echocardiogram EF 60-65%, grade 2 DD, PASP 25 -Continue furosemide 40 mg IV twice a day -Daily weights -Strict I's and O's -She remains clinically fluid overloaded  Atrial fibrillation with RVR -This is likely precipitated by her underlying lung disease -Continue diltiazem drip -Continue heparin drip -Consult cardiology -CHADSVASc = 6 (CHF, HTN, Age, DM, female) -she remains clinically fluid overloaded  Pulmonary HTN -suspected PAH  -based on the size of her pulmonary artery on CT scan of the chest -11/23/2015 echo--EF 65-70%, normal PASP -09/24/16--repeat echo--EF 60-65%, grade 2 DD, PASP 25  Atypical chest pain -personally reviewed EKG--Afib with nonspecific ST changes -personally reviewed CXR--increased interstital markings, bilateral pleural effusions R>L -cycle troponins  Pulmonary fibrosis -follow Dr. Lamonte Sakai -restart bronchodilators  CKD stage 2 with solitary kidney -baseline creatinine 0.8-1.0 -monitor with diuresis  Essential hypertension -Controlled on metoprolol succinate -diltiazem drip  Hyperlipidemia -Continue statin  Hypothyroidism -Continue Synthroid    Disposition Plan:   Home in 2-3 days  Family Communication:  No Family at bedside  Consultants:  cardiology  Code Status:  FULL  DVT Prophylaxis:  IV Heparin    Procedures: As Listed in Progress Note Above  Antibiotics: None    Subjective: Patient says her breathing has not much better. She complains of shortness of breath with any type of movement. She denies any dizziness, chest pain, nausea, vomiting, diarrhea, abdominal pain. She complains of constipation.  Objective: Vitals:   11/11/16 0345 11/11/16 0400 11/11/16 0500 11/11/16 0600  BP: 124/69 (!) 116/59 109/69 117/76  Pulse: 93 83 (!) 104 (!) 112  Resp: 14 16 16 20   Temp:      TempSrc:  SpO2: 98% 98% 95% 98%  Weight:   111.4 kg (245 lb 9.5  oz)   Height:        Intake/Output Summary (Last 24 hours) at 11/11/16 0724 Last data filed at 11/11/16 0054  Gross per 24 hour  Intake             59.5 ml  Output                0 ml  Net             59.5 ml   Weight change:  Exam:   General:  Pt is alert, follows commands appropriately, not in acute distress  HEENT: No icterus, No thrush, No neck mass, /AT  Cardiovascular: RRR, S1/S2, no rubs, no gallops + JVD  Respiratory: Bibasilar crackles. Diminished breath sounds at bases. No wheezing   Abdomen: Soft/+BS, non tender, non distended, no guarding  Extremities: 2 +LE edema, No lymphangitis, No petechiae, No rashes, no synovitis   Data Reviewed: I have personally reviewed following labs and imaging studies Basic Metabolic Panel:  Recent Labs Lab 11/10/16 1900 11/11/16 0539  NA 140 142  K 5.1 3.8  CL 99* 98*  CO2 33* 36*  GLUCOSE 110* 104*  BUN 14 13  CREATININE 1.09* 0.97  CALCIUM 9.5 9.2   Liver Function Tests:  Recent Labs Lab 11/11/16 0539  AST 27  ALT 32  ALKPHOS 56  BILITOT 0.7  PROT 7.4  ALBUMIN 3.6   No results for input(s): LIPASE, AMYLASE in the last 168 hours. No results for input(s): AMMONIA in the last 168 hours. Coagulation Profile:  Recent Labs Lab 11/10/16 2115  INR 1.21   CBC:  Recent Labs Lab 11/10/16 1745 11/11/16 0539  WBC 11.1* 10.9*  NEUTROABS 8.1*  --   HGB 12.0 12.0  HCT 39.2 38.4  MCV 96.8 95.3  PLT 210 218   Cardiac Enzymes:  Recent Labs Lab 11/10/16 1900  TROPONINI <0.03   BNP: Invalid input(s): POCBNP CBG: No results for input(s): GLUCAP in the last 168 hours. HbA1C: No results for input(s): HGBA1C in the last 72 hours. Urine analysis:    Component Value Date/Time   COLORURINE COLORLESS (A) 11/10/2016 1748   APPEARANCEUR CLEAR 11/10/2016 1748   LABSPEC 1.004 (L) 11/10/2016 1748   PHURINE 7.0 11/10/2016 1748   GLUCOSEU NEGATIVE 11/10/2016 1748   HGBUR NEGATIVE 11/10/2016 1748    BILIRUBINUR NEGATIVE 11/10/2016 1748   BILIRUBINUR neg 04/06/2015 1517   KETONESUR NEGATIVE 11/10/2016 1748   PROTEINUR NEGATIVE 11/10/2016 1748   UROBILINOGEN negative 04/06/2015 1517   UROBILINOGEN 0.2 05/14/2011 1942   NITRITE NEGATIVE 11/10/2016 1748   LEUKOCYTESUR NEGATIVE 11/10/2016 1748   Sepsis Labs: @LABRCNTIP (procalcitonin:4,lacticidven:4) ) Recent Results (from the past 240 hour(s))  MRSA PCR Screening     Status: None   Collection Time: 11/11/16  1:00 AM  Result Value Ref Range Status   MRSA by PCR NEGATIVE NEGATIVE Final    Comment:        The GeneXpert MRSA Assay (FDA approved for NASAL specimens only), is one component of a comprehensive MRSA colonization surveillance program. It is not intended to diagnose MRSA infection nor to guide or monitor treatment for MRSA infections.      Scheduled Meds: . aspirin EC  81 mg Oral Daily  . calcium carbonate  625 mg Oral BID WC  . cholecalciferol  400 Units Oral Daily  . fluticasone  1 spray Each Nare BID  . furosemide  40 mg Intravenous BID  . levothyroxine  100 mcg Oral QAC breakfast  . metoprolol succinate  50 mg Oral Daily  . olopatadine  1 drop Both Eyes BID  . pantoprazole  40 mg Oral Daily  . predniSONE  10 mg Oral Daily  . simvastatin  40 mg Oral q1800  . tiotropium  18 mcg Inhalation Daily  . trimethoprim  100 mg Oral Daily  . umeclidinium bromide  1 puff Inhalation Daily  . cyanocobalamin  500 mcg Oral Daily  . vitamin C  500 mg Oral Daily  . [START ON 11/12/2016] Vitamin D (Ergocalciferol)  50,000 Units Oral Q7 days   Continuous Infusions: . diltiazem (CARDIZEM) infusion 15 mg/hr (11/11/16 0643)  . heparin 1,400 Units/hr (11/11/16 9563)    Procedures/Studies: Dg Chest Port 1 View  Result Date: 11/10/2016 CLINICAL DATA:  Shortness of breath and tachycardia for 2 days, initial encounter EXAM: PORTABLE CHEST 1 VIEW COMPARISON:  09/23/2016 FINDINGS: Cardiac shadow is mildly enlarged. Mild vascular  congestion is noted new from the prior exam. Small bilateral pleural effusions are noted right greater than left with likely underlying atelectasis. No bony abnormality is seen. IMPRESSION: Changes consistent with CHF and prominent right pleural effusion. Electronically Signed   By: Inez Catalina M.D.   On: 11/10/2016 17:54    Jameeka Marcy, DO  [facility] Pager [phone]  If 7PM-7AM, please contact night-coverage [email or web address] Password [email or web address] 11/11/2016, 7:24 AM   LOS: 1 day

## 2016-11-11 NOTE — Consult Note (Addendum)
Cardiology Consultation:   Patient ID: TEHANI MERSMAN; [id number]; 1941/03/07   Admit date: 11/10/2016 Date of Consult: 11/11/2016  Primary Care Provider: Sharion Balloon, FNP Primary Cardiologist: Minus Breeding MD    Patient Profile:   Lorraine Leonard is a 76 y.o. female with a hx of HTN, Hyperlipidemia, Hypothyroidism, Pulmonary fibrosis, DM 2, GERD, Depression and Anxiety,  who is being seen today for the evaluation of new onset atrial fib. at the request of Dr. Carles Collet, Hospitalist.   History of Present Illness:   Lorraine Leonard was admitted via the emergency room after being seen by primary care physician for weakness and shortness of breath. EKG revealed atrial fibrillation with RVR, and she was transported to ER for further treatment. She was seen by Dr. Truman Hayward, EKG revealed atrial fibrillation with heart rate of 148 bpm.   Of note, the patient was just recently discharged on 09/25/2016 after admission for acute respiratory distress with hypoxia secondary to pulmonary fibrosis and interstitial lung disease. She was treated with antibiotics and sent home on by mouth antibiotics along with home O2 3 L per nasal cannula.  She reports that over the last week she's been noticing lower extremity edema, worsening shortness of breath and fatigue. She denies chest pain, dizziness, nausea vomiting or diaphoresis  Admission vital signs in ER blood pressure 126/85, heart rate 133, O2 sat 98% on 3 L of oxygen, heart rate increased to 146 bpm. She was started on diltiazem drip after a 10 mg bolus and started on IV heparin. Pertinent labs revealed potassium of 5.1 chloride 99, CO2 33, glucose 110, BUN 14, creatinine 1.09 PT 15.4, INR 1.21 white blood cells were elevated at 11.1, she was not found to be anemic or thrombocytopenic chest x-ray revealed changes consistent with CHF and prominent right pleural. effusion.   She was started on IV Lasix 40 mg twice a day, after given one dose of 40 mg 1 in ER,  echocardiogram is planned. She was restarted on metoprolol 50 mg by mouth daily, also on levothyroxine, and aspirin. Most recent echocardiogram was completed on 09/24/2016. This revealed normal LV systolic function with mild LVH, EF of 60% to 65%. left atrium was normal in size. Mitral valve revealed calcified annulus but no significant regurgitation.   Past Medical History:  Diagnosis Date  . Acute diastolic CHF (congestive heart failure) ([place]) 11/11/2016  . Allergic rhinitis   . Anxiety   . Anxiety   . Depression   . Diabetes mellitus    pre-diabetic  . Diverticulosis   . GERD (gastroesophageal reflux disease)   . Hyperlipidemia   . Hypertension   . Hypothyroidism   . Lung fibrosis ([place])   . Macular degeneration   . Rheumatoid arthritis(714.0)   . Urticaria     Past Surgical History:  Procedure Laterality Date  . EUS N/A 10/03/2015   Procedure: UPPER ENDOSCOPIC ULTRASOUND (EUS) RADIAL;  Surgeon: Arta Silence, MD;  Location: [facility];  Service: Endoscopy;  Laterality: N/A;  . FINE NEEDLE ASPIRATION N/A 10/03/2015   Procedure: FINE NEEDLE ASPIRATION (FNA) RADIAL;  Surgeon: Arta Silence, MD;  Location: [facility];  Service: Endoscopy;  Laterality: N/A;  . 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 LUMBAR DISC SURGERY     x 2  . TUBAL LIGATION    . VAGINAL HYSTERECTOMY       Inpatient Medications: Scheduled Meds: . aspirin EC  81 mg Oral Daily  . calcium  carbonate  625 mg Oral BID WC  . cholecalciferol  400 Units Oral Daily  . fluticasone  1 spray Each Nare BID  . furosemide  40 mg Intravenous BID  . levothyroxine  100 mcg Oral QAC breakfast  . metoprolol succinate  50 mg Oral Daily  . olopatadine  1 drop Both Eyes BID  . pantoprazole  40 mg Oral Daily  . predniSONE  10 mg Oral Daily  . simvastatin  40 mg Oral q1800  . tiotropium  18 mcg Inhalation Daily  . trimethoprim  100 mg Oral Daily  . umeclidinium bromide  1 puff Inhalation Daily  .  cyanocobalamin  500 mcg Oral Daily  . vitamin C  500 mg Oral Daily  . [START ON 11/12/2016] Vitamin D (Ergocalciferol)  50,000 Units Oral Q7 days   Continuous Infusions: . diltiazem (CARDIZEM) infusion 15 mg/hr (11/11/16 0800)  . heparin 1,400 Units/hr (11/11/16 0800)   PRN Meds: diazepam, ondansetron **OR** ondansetron (ZOFRAN) IV, ondansetron  Allergies:    Allergies  Allergen Reactions  . Allopurinol   . Mobic [Meloxicam] Swelling and Other (See Comments)    Leg swelling    Social History:   Social History   Social History  . Marital status: Married    Spouse name: N/A  . Number of children: 4  . Years of education: N/A   Occupational History  . retired     [employer]   Social History Main Topics  . Smoking status: Never Smoker  . Smokeless tobacco: Never Used  . Alcohol use No  . Drug use: No  . Sexual activity: Not Currently   Other Topics Concern  . 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  ROS:  Please see the history of present illness.  ROS  All other ROS reviewed and negative.     Physical Exam/Data:   Vitals:   11/11/16 0500 11/11/16 0600 11/11/16 0700 11/11/16 0900  BP: 109/69 117/76 (!) 137/101   Pulse: (!) 104 (!) 112 (!) 108   Resp: 16 20 (!) 26   Temp:    97.6 F (36.4 C)  TempSrc:    Oral  SpO2: 95% 98% 95%   Weight: 245 lb 9.5 oz (111.4 kg)     Height:        Intake/Output Summary (Last 24 hours) at 11/11/16 1013 Last data  filed at 11/11/16 0800  Gross per 24 hour  Intake            117.5 ml  Output              300 ml  Net           -182.5 ml   Filed Weights   11/10/16 1729 11/11/16 0054 11/11/16 0500  Weight: 238 lb (108 kg) 245 lb 9.5 oz (111.4 kg) 245 lb 9.5 oz (111.4 kg)   Body mass index is 37.34 kg/m.  General:  Well nourished, well developed, ill appearing. HEENT: normal Lymph: no  adenopathy Neck: no JVD Endocrine:  No thryomegaly Vascular: No carotid bruits; DP 2+ bilaterally without bruits  Cardiac:  normal S1, S2; IRRR, tachycardic, soft systolic murmur.  Lungs:  Inspiratory rales, mild wheezing expiration, no coughing, O2 2/L  Abd: soft, nontender, no hepatomegaly  Ext: 2+ pitting  Edema bilateral LE.  Musculoskeletal:  No deformities, BUE and BLE strength normal and equal Skin: warm and dry  Neuro:  CNs 2-12 intact, no focal abnormalities noted Psych:  Normal affect   EKG:  The EKG was personally reviewed and demonstrates:  Atrial fib with RVR. Telemetry: Atrial fib rates between 117-130 bpm.   Relevant CV Studies: 09/24/2016 Echocardiogram  Left ventricle: The cavity size was normal. Wall thickness was   increased in a pattern of mild LVH. Systolic function was normal.   The estimated ejection fraction was in the range of 60% to 65%.   Wall motion was normal; there were no regional wall motion   abnormalities. Features are consistent with a pseudonormal left   ventricular filling pattern, with concomitant abnormal relaxation   and increased filling pressure (grade 2 diastolic dysfunction). - Aortic valve: Mildly calcified annulus. Trileaflet. - Mitral valve: Calcified annulus. - Right atrium: Central venous pressure (est): 3 mm Hg. - Atrial septum: No defect or patent foramen ovale was identified. - Tricuspid valve: There was trivial regurgitation. - Pulmonary arteries: PA peak pressure: 25 mm Hg (S). - Pericardium, extracardiac: A prominent pericardial fat pad was    present.  Laboratory Data:  Chemistry Recent Labs Lab 11/10/16 1900 11/11/16 0539  NA 140 142  K 5.1 3.8  CL 99* 98*  CO2 33* 36*  GLUCOSE 110* 104*  BUN 14 13  CREATININE 1.09* 0.97  CALCIUM 9.5 9.2  GFRNONAA 48* 55*  GFRAA 56* >60  ANIONGAP 8 8     Recent Labs Lab 11/11/16 0539  PROT 7.4  ALBUMIN 3.6  AST 27  ALT 32  ALKPHOS 56  BILITOT 0.7   Hematology Recent Labs Lab 11/10/16 1745 11/11/16 0539  WBC 11.1* 10.9*  RBC 4.05 4.03  HGB 12.0 12.0  HCT 39.2 38.4  MCV 96.8 95.3  MCH 29.6 29.8  MCHC 30.6 31.3  RDW 15.3 15.5  PLT 210 218   Cardiac Enzymes Recent Labs Lab 11/10/16 1900  TROPONINI <0.03   No results for input(s): TROPIPOC in the last 168 hours.  BNP Recent Labs Lab 11/10/16 1745  BNP 607.0*    Radiology/Studies:  Dg Chest Port 1 View  Result Date: 11/10/2016 CLINICAL DATA:  Shortness of breath and tachycardia for 2 days, initial encounter EXAM: PORTABLE CHEST 1 VIEW COMPARISON:  09/23/2016 FINDINGS: Cardiac shadow is mildly enlarged. Mild vascular congestion is noted new from the prior exam. Small bilateral pleural effusions are noted right greater than left with likely underlying atelectasis. No bony abnormality is seen. IMPRESSION: Changes consistent with CHF and prominent right pleural effusion. Electronically Signed   By: Inez Catalina M.D.   On: 11/10/2016 17:54    Assessment and Plan:   1. Atrial fib with RVR: Continues on diltiazem drip at 15 mg an hour, with heart rate uncontrolled but improved since admission. She continues on IV heparin drip.CHADS VASC Score of 5 she has no history of irregular heart rhythm in the past. No history had any issues with GI bleeding.. V. Review of home medications as her on metoprolol succinate 25 mg daily. Patient will likely need to transition to [place].  Etiology may be acute illness with  COPD exacerbation and ongoing treatment for same  Troponin is been negative. With antibiotics and steroids. K  consider IV digoxin loading, would not want to increase beta blocker in the setting of lung disease or add amiodarone at this time. Discuss further with Dr. Harl Bowie.Will give one dose of IV digoxin 0.5 mg.  2. CHF: Likely from rapid heart rate and reduced cardiac output. She has been given IV Lasix and has diuresed 182 cc with IV Lasix since admission. She continues to have lower extremity edema and dyspnea. Creatinine 0.97. Continue IV Lasix. BNP 607.  3. Hypertension: Elevated initially but has been trending down. Diastolic numbers are elevated.  4. COPD: Ongoing treatment by PCP. Inhalers haven't changed his opening by primary care on admission.  5. Diabetes: On sliding scale. Followed by PCP.    Signed, Jory Sims DNP, ANP-C [facility] 11/11/2016 10:13 AM   Patient seen and discussed with DNP Lawerence, I agree with her documentation. 76 yo female history of pulmonary fibrosis, prediabetes, HTN, hyperlipidemia, hypthyroidism, history of prior atypical chest pain admitted with SOB. Found to be in afib with RVR,a new diagnosis for her. Started on IV dilt and heparin gtt for CHADS2Vasc score of 5.    BNP 607, Hgb 12, Plt 210, K 5.1, Cr 1.09, TSH 2.4,  Trop neg  CXR suggesting mild pulmonary edema, right pleural effusion 09/2016 CT PE: no PE, dilated pulmonary artery 09/2016 echo LVEF 60-65%, no WMAs, grade II diastolic dysfunction, PASP 25 EKG   Patient admitted with new onset afib with RVR, likely exacerbating her chronic diastolic HF. Currently on dilt gtt and Toprol XL, rates remain elevated. Agree with digoxin load, plan to ultiamately convert to oral therapy dilt once rates controlled. Convert heparin to eliquis for stroke prevention. Continue IV diuresis.  Carlyle Dolly MD

## 2016-11-12 ENCOUNTER — Encounter (INDEPENDENT_AMBULATORY_CARE_PROVIDER_SITE_OTHER): Payer: Medicare Other | Admitting: Ophthalmology

## 2016-11-12 DIAGNOSIS — N182 Chronic kidney disease, stage 2 (mild): Secondary | ICD-10-CM

## 2016-11-12 DIAGNOSIS — J9621 Acute and chronic respiratory failure with hypoxia: Secondary | ICD-10-CM

## 2016-11-12 DIAGNOSIS — J8410 Pulmonary fibrosis, unspecified: Secondary | ICD-10-CM

## 2016-11-12 DIAGNOSIS — E669 Obesity, unspecified: Secondary | ICD-10-CM

## 2016-11-12 DIAGNOSIS — E038 Other specified hypothyroidism: Secondary | ICD-10-CM

## 2016-11-12 LAB — CBC
HCT: 39.4 % (ref 36.0–46.0)
Hemoglobin: 12.1 g/dL (ref 12.0–15.0)
MCH: 29.3 pg (ref 26.0–34.0)
MCHC: 30.7 g/dL (ref 30.0–36.0)
MCV: 95.4 fL (ref 78.0–100.0)
Platelets: 225 10*3/uL (ref 150–400)
RBC: 4.13 MIL/uL (ref 3.87–5.11)
RDW: 15 % (ref 11.5–15.5)
WBC: 9.4 10*3/uL (ref 4.0–10.5)

## 2016-11-12 LAB — URINE CULTURE: Culture: NO GROWTH

## 2016-11-12 MED ORDER — IPRATROPIUM BROMIDE 0.02 % IN SOLN
0.5000 mg | Freq: Three times a day (TID) | RESPIRATORY_TRACT | Status: DC
  Administered 2016-11-12 – 2016-11-13 (×4): 0.5 mg via RESPIRATORY_TRACT
  Filled 2016-11-12 (×5): qty 2.5

## 2016-11-12 MED ORDER — LEVALBUTEROL HCL 0.63 MG/3ML IN NEBU: 0.6300 mg | INHALATION_SOLUTION | RESPIRATORY_TRACT | Status: DC | PRN

## 2016-11-12 MED ORDER — DILTIAZEM HCL 60 MG PO TABS
60.0000 mg | ORAL_TABLET | Freq: Three times a day (TID) | ORAL | Status: DC
  Administered 2016-11-12 – 2016-11-13 (×4): 60 mg via ORAL
  Filled 2016-11-12 (×4): qty 1

## 2016-11-12 MED ORDER — POTASSIUM CHLORIDE CRYS ER 20 MEQ PO TBCR
40.0000 meq | EXTENDED_RELEASE_TABLET | Freq: Once | ORAL | Status: AC
  Administered 2016-11-12: 40 meq via ORAL
  Filled 2016-11-12: qty 2

## 2016-11-12 MED ORDER — ATORVASTATIN CALCIUM 20 MG PO TABS
20.0000 mg | ORAL_TABLET | Freq: Every day | ORAL | Status: DC
  Administered 2016-11-12 – 2016-11-13 (×2): 20 mg via ORAL
  Filled 2016-11-12 (×2): qty 1

## 2016-11-12 MED ORDER — LEVALBUTEROL HCL 0.63 MG/3ML IN NEBU
0.6300 mg | INHALATION_SOLUTION | Freq: Three times a day (TID) | RESPIRATORY_TRACT | Status: DC
  Administered 2016-11-12 – 2016-11-13 (×4): 0.63 mg via RESPIRATORY_TRACT
  Filled 2016-11-12 (×5): qty 3

## 2016-11-12 NOTE — Progress Notes (Signed)
PROGRESS NOTE    Lorraine Leonard  ZHG:[id number] DOB: 23-Apr-1940 DOA: 11/10/2016 PCP: Sharion Balloon, FNP    Brief Narrative:  76 year old female with a history of pulmonary fibrosis, chronic respiratory failure on 3 L at home, hypertension, impaired glucose tolerance, hypothyroidism, hyperlipidemia presented with one-week history of progressive shortness of breath and PND type symptoms.  Patient states that she has had 3-4 day history of worsening lower extremity edema. She has been having intermittent chest discomfort and palpitations over the past 3-4 days. She denies any dizziness, nausea, vomiting, diarrhea,  fevers, chills. She continues to have chronic nonproductive cough. The patient was recently discharged from the hospital after a stay from 09/23/2016 through 09/25/2016 secondary to progression/exacerbation of her underlying pulmonary interstitial lung disease. She was discharged home with 3 L nasal cannula at that time. CT chest angio  on 09/23/2016 was negative pulmonary embolus.    The patient had previously seen pulmonary medicine, Dr. Elie Goody to interstitial and nodular changes on CT in the left lower lobe. She had continue to follow Dr. Lamonte Sakai on an annual basis for her interstitial lung disease and history of rheumatoid arthritis. There wasa question whether the patient actually had rheumatoid arthritis. She followed up with rheumatology, Dr. Gavin Pound. Per the patient, she was told that "more or less, I did not have rheumatoid arthritis". Nevertheless, the patient saw her primary care provider on the day of admission, and she was found to have atrial fibrillation with RVR. She was sent to the emergency department for further evaluation.   Assessment & Plan:   Active Problems:   Essential hypertension   Hypothyroidism   Vitamin D deficiency   Single kidney   Pulmonary hypertension (HCC)   Morbid obesity (HCC)   Diabetes mellitus ([place])   Pulmonary fibrosis  (HCC)   Atrial fibrillation with RVR (HCC)   Acute on chronic respiratory failure with hypoxia (HCC)   Acute diastolic CHF (congestive heart failure) (HCC)   CKD (chronic kidney disease) stage 2, GFR 60-89 ml/min   1. Acute on chronic respiratory failure with hypoxia. Secondary to decompensated CHF/pulmonary fibrosis/interstitial lung disease. Baseline oxygen requirement is 3 L at home. Currently on 4 L. Continue to wean down oxygen as tolerated. 2. Acute diastolic congestive heart failure. Clinically improving with IV Lasix. Intake and output has not been accurately documented. Continue current treatments. Still has some volume overload. 3. Atrial fibrillation with rapid ventricular response. Still on Cardizem infusion, but weaning down. Also on oral Cardizem and Toprol. Loaded with digoxin. Anticoagulated with eliquis 4. 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 Renal function has been stable. Continue to monitor setting of diuresis. 5. Pulmonary fibrosis. Followed by Dr. Lamonte Sakai. Continue on maintenance prednisone. Continue bronchodilators. 6. Hypertension. Blood pressure is currently controlled on Toprol and Cardizem. Continue to monitor. 7. Hyperlipidemia. Continue statin 8. Hypothyroidism. Continue Synthroid 9. Obesity, class II. Counseled on the importance of diet and exercise.   DVT prophylaxis: eliquis Code Status: Full code Family Communication: No Family present Disposition Plan: Discharge home once improved   Consultants:   Cardiology  Procedures:     Antimicrobials:      Subjective: Feeling better. Shortness of breath improving. Not back to baseline yet.  Objective: Vitals:   11/12/16 0730 11/12/16 0745 11/12/16 0800 11/12/16 0815  BP: 128/75 (!) 138/126 126/72 115/75  Pulse: 74 (!) 123 83 (!) 116  Resp:      Temp:      TempSrc:  SpO2: 98% 90% 98% 94%  Weight:      Height:        Intake/Output Summary (Last 24 hours) at 11/12/16  0954 Last data filed at 11/11/16 1800  Gross per 24 hour  Intake              935 ml  Output             1200 ml  Net             -265 ml   Filed Weights   11/11/16 0054 11/11/16 0500 11/12/16 0500  Weight: 111.4 kg (245 lb 9.5 oz) 111.4 kg (245 lb 9.5 oz) 107.6 kg (237 lb 3.4 oz)    Examination:  General exam: Appears calm and comfortable  Respiratory system: bilateral crackles. Respiratory effort normal. Cardiovascular system: S1 & S2 heard, irregular. No JVD, murmurs, rubs, gallops or clicks. 1+ pedal edema. Gastrointestinal system: Abdomen is nondistended, soft and nontender. No organomegaly or masses felt. Normal bowel sounds heard. Central nervous system: Alert and oriented. No focal neurological deficits. Extremities: Symmetric 5 x 5 power. Skin: No rashes, lesions or ulcers Psychiatry: Judgement and insight appear normal. Mood & affect appropriate.     Data Reviewed: I have personally reviewed following labs and imaging studies  CBC:  Recent Labs Lab 11/10/16 1745 11/11/16 0539 11/12/16 0536  WBC 11.1* 10.9* 9.4  NEUTROABS 8.1*  --   --   HGB 12.0 12.0 12.1  HCT 39.2 38.4 39.4  MCV 96.8 95.3 95.4  PLT 210 218 417   Basic Metabolic Panel:  Recent Labs Lab 11/10/16 1900 11/11/16 0539  NA 140 142  K 5.1 3.8  CL 99* 98*  CO2 33* 36*  GLUCOSE 110* 104*  BUN 14 13  CREATININE 1.09* 0.97  CALCIUM 9.5 9.2   GFR: Estimated Creatinine Clearance: 63.4 mL/min (by C-G formula based on SCr of 0.97 mg/dL). Liver Function Tests:  Recent Labs Lab 11/11/16 0539  AST 27  ALT 32  ALKPHOS 56  BILITOT 0.7  PROT 7.4  ALBUMIN 3.6   No results for input(s): LIPASE, AMYLASE in the last 168 hours. No results for input(s): AMMONIA in the last 168 hours. Coagulation Profile:  Recent Labs Lab 11/10/16 2115  INR 1.21   Cardiac Enzymes:  Recent Labs Lab 11/10/16 1900  TROPONINI <0.03   BNP (last 3 results) No results for input(s): PROBNP in the last 8760  hours. HbA1C: No results for input(s): HGBA1C in the last 72 hours. CBG: No results for input(s): GLUCAP in the last 168 hours. Lipid Profile: No results for input(s): CHOL, HDL, LDLCALC, TRIG, CHOLHDL, LDLDIRECT in the last 72 hours. Thyroid Function Tests:  Recent Labs  11/10/16 2115  TSH 2.402   Anemia Panel: No results for input(s): VITAMINB12, FOLATE, FERRITIN, TIBC, IRON, RETICCTPCT in the last 72 hours. Sepsis Labs: No results for input(s): PROCALCITON, LATICACIDVEN in the last 168 hours.  Recent Results (from the past 240 hour(s))  Urine culture     Status: None   Collection Time: 11/10/16  5:48 PM  Result Value Ref Range Status   Specimen Description URINE, CLEAN CATCH  Final   Special Requests NONE  Final   Culture   Final    NO GROWTH Performed at [facility], [address], [place], [place] [address]    Report Status 11/12/2016 FINAL  Final  MRSA PCR Screening     Status: None   Collection Time: 11/11/16  1:00  AM  Result Value Ref Range Status   MRSA by PCR NEGATIVE NEGATIVE Final    Comment:        The GeneXpert MRSA Assay (FDA approved for NASAL specimens only), is one component of a comprehensive MRSA colonization surveillance program. It is not intended to diagnose MRSA infection nor to guide or monitor treatment for MRSA infections.          Radiology Studies: Dg Chest Port 1 View  Result Date: 11/10/2016 CLINICAL DATA:  Shortness of breath and tachycardia for 2 days, initial encounter EXAM: PORTABLE CHEST 1 VIEW COMPARISON:  09/23/2016 FINDINGS: Cardiac shadow is mildly enlarged. Mild vascular congestion is noted new from the prior exam. Small bilateral pleural effusions are noted right greater than left with likely underlying atelectasis. No bony abnormality is seen. IMPRESSION: Changes consistent with CHF and prominent right pleural effusion. Electronically Signed   By: Inez Catalina M.D.   On: 11/10/2016 17:54        Scheduled  Meds: . apixaban  5 mg Oral BID  . atorvastatin  20 mg Oral q1800  . calcium carbonate  625 mg Oral BID WC  . cholecalciferol  400 Units Oral Daily  . diltiazem  60 mg Oral Q8H  . fluticasone  1 spray Each Nare BID  . furosemide  40 mg Intravenous BID  . ipratropium  0.5 mg Nebulization Q8H  . levalbuterol  0.63 mg Nebulization Q8H  . levothyroxine  100 mcg Oral QAC breakfast  . metoprolol succinate  50 mg Oral Daily  . olopatadine  1 drop Both Eyes BID  . pantoprazole  40 mg Oral Daily  . potassium chloride  40 mEq Oral Once  . predniSONE  10 mg Oral Daily  . trimethoprim  100 mg Oral Daily  . umeclidinium bromide  1 puff Inhalation Daily  . cyanocobalamin  500 mcg Oral Daily  . vitamin C  500 mg Oral Daily  . Vitamin D (Ergocalciferol)  50,000 Units Oral Q7 days   Continuous Infusions:   LOS: 2 days    Time spent: 78mins    Meera Vasco, MD [facility] Pager [phone]  If 7PM-7AM, please contact night-coverage [email or web address] Password [facility] 11/12/2016, 9:54 AM

## 2016-11-12 NOTE — Progress Notes (Addendum)
Visitation guideline information form is signed and put in pt paper chart. It list two contact individuals that the family and pt has designated for the staff to speak with and have them tell their family members about plan of care. Linda Ware([employer] in [employer]) # [phone]. Chelle Duncan(daughter) # [phone]. Patient password is documented as well.  Lorenna Lurry Rica Mote, RN

## 2016-11-12 NOTE — Progress Notes (Signed)
Progress Note  Patient Name: Lorraine Leonard Date of Encounter: 11/12/2016  Primary Cardiologist: Dr. Percival Spanish  Subjective   Breathing improved, but not at baseline. She denies any palpitations at rest but notices this when walking around the room. No chest pain.   Inpatient Medications    Scheduled Meds: . apixaban  5 mg Oral BID  . calcium carbonate  625 mg Oral BID WC  . cholecalciferol  400 Units Oral Daily  . fluticasone  1 spray Each Nare BID  . furosemide  40 mg Intravenous BID  . ipratropium-albuterol  3 mL Nebulization Q8H  . levothyroxine  100 mcg Oral QAC breakfast  . metoprolol succinate  50 mg Oral Daily  . olopatadine  1 drop Both Eyes BID  . pantoprazole  40 mg Oral Daily  . predniSONE  10 mg Oral Daily  . simvastatin  40 mg Oral q1800  . trimethoprim  100 mg Oral Daily  . umeclidinium bromide  1 puff Inhalation Daily  . cyanocobalamin  500 mcg Oral Daily  . vitamin C  500 mg Oral Daily  . Vitamin D (Ergocalciferol)  50,000 Units Oral Q7 days   Continuous Infusions: . diltiazem (CARDIZEM) infusion 10 mg/hr (11/11/16 2308)   PRN Meds: diazepam, ondansetron **OR** ondansetron (ZOFRAN) IV, ondansetron, traMADol   Vital Signs    Vitals:   11/12/16 0730 11/12/16 0745 11/12/16 0800 11/12/16 0815  BP: 128/75 (!) 138/126 126/72 115/75  Pulse: 74 (!) 123 83 (!) 116  Resp:      Temp:      TempSrc:      SpO2: 98% 90% 98% 94%  Weight:      Height:        Intake/Output Summary (Last 24 hours) at 11/12/16 0842 Last data filed at 11/11/16 1800  Gross per 24 hour  Intake             1310 ml  Output             1200 ml  Net              110 ml   Filed Weights   11/11/16 0054 11/11/16 0500 11/12/16 0500  Weight: 245 lb 9.5 oz (111.4 kg) 245 lb 9.5 oz (111.4 kg) 237 lb 3.4 oz (107.6 kg)    Telemetry    Atrial fibrillation, HR in 60's - 90's overnight. Pauses up to 2.56 seconds this morning.  - Personally Reviewed  ECG    Atrial fibrillation with RVR,  HR 137 - Personally Reviewed  Physical Exam   General: Well developed, overweight Caucasian female appearing in no acute distress. Head: Normocephalic, atraumatic.  Neck: Supple without bruits, JVD at 9cm. Lungs: Resp regular and unlabored, dry rales along upper lung fields, diminished breath sounds at bases bilaterally. Heart: Irregularly irregular, S1, S2, no S3, S4, or murmur; no rub. Abdomen: Soft, non-tender, non-distended with normoactive bowel sounds. No hepatomegaly. No rebound/guarding. No obvious abdominal masses. Extremities: No clubbing or cyanosis, 1+ pitting edema up to mid-shins bilaterally, more noticeable on left. Distal pedal pulses are 2+ bilaterally. Neuro: Alert and oriented X 3. Moves all extremities spontaneously. Psych: Normal affect.  Labs    Chemistry Recent Labs Lab 11/10/16 1900 11/11/16 0539  NA 140 142  K 5.1 3.8  CL 99* 98*  CO2 33* 36*  GLUCOSE 110* 104*  BUN 14 13  CREATININE 1.09* 0.97  CALCIUM 9.5 9.2  PROT  --  7.4  ALBUMIN  --  3.6  AST  --  27  ALT  --  32  ALKPHOS  --  56  BILITOT  --  0.7  GFRNONAA 48* 55*  GFRAA 56* >60  ANIONGAP 8 8     Hematology Recent Labs Lab 11/10/16 1745 11/11/16 0539 11/12/16 0536  WBC 11.1* 10.9* 9.4  RBC 4.05 4.03 4.13  HGB 12.0 12.0 12.1  HCT 39.2 38.4 39.4  MCV 96.8 95.3 95.4  MCH 29.6 29.8 29.3  MCHC 30.6 31.3 30.7  RDW 15.3 15.5 15.0  PLT 210 218 225    Cardiac Enzymes Recent Labs Lab 11/10/16 1900  TROPONINI <0.03   No results for input(s): TROPIPOC in the last 168 hours.   BNP Recent Labs Lab 11/10/16 1745  BNP 607.0*     DDimer No results for input(s): DDIMER in the last 168 hours.   Radiology    Dg Chest Port 1 View  Result Date: 11/10/2016 CLINICAL DATA:  Shortness of breath and tachycardia for 2 days, initial encounter EXAM: PORTABLE CHEST 1 VIEW COMPARISON:  09/23/2016 FINDINGS: Cardiac shadow is mildly enlarged. Mild vascular congestion is noted new from the  prior exam. Small bilateral pleural effusions are noted right greater than left with likely underlying atelectasis. No bony abnormality is seen. IMPRESSION: Changes consistent with CHF and prominent right pleural effusion. Electronically Signed   By: Inez Catalina M.D.   On: 11/10/2016 17:54    Cardiac Studies   Echocardiogram: 09/24/2016 Study Conclusions  - Left ventricle: The cavity size was normal. Wall thickness was   increased in a pattern of mild LVH. Systolic function was normal.   The estimated ejection fraction was in the range of 60% to 65%.   Wall motion was normal; there were no regional wall motion   abnormalities. Features are consistent with a pseudonormal left   ventricular filling pattern, with concomitant abnormal relaxation   and increased filling pressure (grade 2 diastolic dysfunction). - Aortic valve: Mildly calcified annulus. Trileaflet. - Mitral valve: Calcified annulus. - Right atrium: Central venous pressure (est): 3 mm Hg. - Atrial septum: No defect or patent foramen ovale was identified. - Tricuspid valve: There was trivial regurgitation. - Pulmonary arteries: PA peak pressure: 25 mm Hg (S). - Pericardium, extracardiac: A prominent pericardial fat pad was   present.  Impressions:  - Mild LVH with LVEF 60-65% and grade 2 diastolic dysfunction.   Calcified mitral annulus. Mildly sclerotic aortic valve. Trivial   tricuspid regurgitation with normal PASP estimated 25 mmHg.   Prominent pericardial fat pad noted.  Patient Profile     76 y.o. female with PMH of HTN, HLD, Hypothyroidins, Pulmonary Fibrosis (on 3L [place] at baseline), Type 2 DM and GERD who presented to [facility] ED on worsening dyspnea and palpitations. Found to be in atrial fibrillation with RVR and Cardiology was consulted to assist with management of this.   Assessment & Plan    1. Atrial fibrillation with RVR - presented with dyspnea and palpitations, found to be in atrial fibrillation with RVR.   - electrolytes within normal limits. Initial troponin negative. TSH at 2.402. - initially placed on Cardizem drip, titrated to 15 mg/hr. Now down to 10 mg/hr. Short-acting Cardizem 60mg  Q8H has been ordered. Discussed with the patient's nurse. Will administer PO dosing now and wean down drip with plans to discontinue. Anticipate switch to Cardizem CD prior to discharge. Also remains on Toprol-XL 50mg  daily.  - This patients CHA2DS2-VASc Score and unadjusted Ischemic Stroke Rate (% per  year) is equal to 7.2 % stroke rate/year from a score of 5 (HTN, DM, Female, Age (2)). Initially on Heparin. Has been switched to Eliquis 5mg  BID. Discussed possible DCCV in 4 weeks following uninterpreted anticoagulation if she remains in atrial fibrillation and symptomatic with this.  2. Acute on Chronic Diastolic CHF - echo in 62/0355 showed a preserved EF of  60-65% with Grade 2 DD.  - BNP 607 on admission with CXR consistent with CHF, likely due to her rapid heart rate and reduced cardiac output.  - she was started on IV Lasix 40mg  BID. Initial recorded weight at 238 with repeat of 245 lbs on the same day (most accurate). Down to 237 lbs this AM. Only recorded net output of -72.5 mL but she reports significant diuresis. Continue with IV Lasix at current dosing as she still appears volume overloaded by examination. Creatinine remains stable. Repeat BMET in AM.   3. Hypertension - BP at 102/50 - 151/126 in the past 24 hours, with most recent reading at 115/75.  - remains on Toprol-XL 50mg  daily with recent switch of IV Cardizem to PO Cardizem 60mg  Q8H.   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  - per admitting team. Would favor the use of Xopenex over Albuterol to avoid rebound tachycardia.   5. Type 2 DM - per admitting team.     Signed, Erma Heritage , PA-C 8:42 AM 11/12/2016 Pager: [phone]  Attending note Patient seen and discussed with PA Strader. Admitted with new diagnosis of afib,  presented with RVR. We will start oral dilt today and wean dilt gtt. Completed a dig load yesterday. She has good bp's so at this time will focus on optimizing Toprol and dilt doses, will not start oral digoxin. Continue eliquis for anticoag. Anticipate changing to long acting dilt tomorrow, if rates controlled anticipate discharge tomorrow.  Carlyle Dolly MD

## 2016-11-13 DIAGNOSIS — I10 Essential (primary) hypertension: Secondary | ICD-10-CM

## 2016-11-13 MED ORDER — DILTIAZEM HCL ER COATED BEADS 180 MG PO CP24
180.0000 mg | ORAL_CAPSULE | Freq: Every day | ORAL | Status: DC
  Administered 2016-11-13 – 2016-11-14 (×2): 180 mg via ORAL
  Filled 2016-11-13 (×2): qty 1

## 2016-11-13 MED ORDER — FUROSEMIDE 40 MG PO TABS
40.0000 mg | ORAL_TABLET | Freq: Every day | ORAL | Status: DC
  Administered 2016-11-14: 40 mg via ORAL
  Filled 2016-11-13: qty 1

## 2016-11-13 MED ORDER — IPRATROPIUM BROMIDE 0.02 % IN SOLN
0.5000 mg | Freq: Three times a day (TID) | RESPIRATORY_TRACT | Status: DC
  Administered 2016-11-14 (×2): 0.5 mg via RESPIRATORY_TRACT
  Filled 2016-11-13 (×2): qty 2.5

## 2016-11-13 MED ORDER — LEVALBUTEROL HCL 0.63 MG/3ML IN NEBU
0.6300 mg | INHALATION_SOLUTION | Freq: Three times a day (TID) | RESPIRATORY_TRACT | Status: DC
  Administered 2016-11-14 (×2): 0.63 mg via RESPIRATORY_TRACT
  Filled 2016-11-13 (×2): qty 3

## 2016-11-13 NOTE — Progress Notes (Signed)
PROGRESS NOTE    Lorraine Leonard  VPX:[id number] DOB: June 24, 1940 DOA: 11/10/2016 PCP: Sharion Balloon, FNP    Brief Narrative:  76 year old female with a history of pulmonary fibrosis, chronic respiratory failure on 3 L at home, hypertension, impaired glucose tolerance, hypothyroidism, hyperlipidemia presented with one-week history of progressive shortness of breath and PND type symptoms.  Patient states that she has had 3-4 day history of worsening lower extremity edema. She has been having intermittent chest discomfort and palpitations over the past 3-4 days. She denies any dizziness, nausea, vomiting, diarrhea,  fevers, chills. She continues to have chronic nonproductive cough. The patient was recently discharged from the hospital after a stay from 09/23/2016 through 09/25/2016 secondary to progression/exacerbation of her underlying pulmonary interstitial lung disease. She was discharged home with 3 L nasal cannula at that time. CT chest angio  on 09/23/2016 was negative pulmonary embolus.    The patient had previously seen pulmonary medicine, Dr. Elie Goody to interstitial and nodular changes on CT in the left lower lobe. She had continue to follow Dr. Lamonte Sakai on an annual basis for her interstitial lung disease and history of rheumatoid arthritis. There wasa question whether the patient actually had rheumatoid arthritis. She followed up with rheumatology, Dr. Gavin Pound. Per the patient, she was told that "more or less, I did not have rheumatoid arthritis". Nevertheless, the patient saw her primary care provider on the day of admission, and she was found to have atrial fibrillation with RVR. She was sent to the emergency department for further evaluation.   Assessment & Plan:   Active Problems:   Essential hypertension   Hypothyroidism   Vitamin D deficiency   Single kidney   Pulmonary hypertension (HCC)   Obesity, Class II, BMI 35-39.9   Pulmonary fibrosis (HCC)   Atrial  fibrillation with RVR (HCC)   Acute on chronic respiratory failure with hypoxia (HCC)   Acute diastolic CHF (congestive heart failure) (HCC)   CKD (chronic kidney disease) stage 2, GFR 60-89 ml/min   1. Acute on chronic respiratory failure with hypoxia. Secondary to decompensated CHF/pulmonary fibrosis/interstitial lung disease. Baseline oxygen requirement is 3 L at home. Currently on 4 L. Continue to wean down oxygen as tolerated. 2. Acute diastolic congestive heart failure. Clinically improving with IV Lasix. Intake and output has not been accurately documented. Continue current treatments. She has been transitioned to oral Lasix today. 3. Atrial fibrillation with rapid ventricular response. She has been weaned off of Cardizem infusion. Oral Cardizem and change to Cardizem CD. Also on Toprol. Anticoagulated with eliquis. Continue to monitor on telemetry 4. 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 Renal function has been stable. Continue to monitor setting of diuresis. 5. Pulmonary fibrosis. Followed by Dr. Lamonte Sakai. Continue on maintenance prednisone. Continue bronchodilators. 6. Hypertension. Blood pressure is currently controlled on Toprol and Cardizem. Continue to monitor. 7. Hyperlipidemia. Continue statin 8. Hypothyroidism. Continue Synthroid 9. Obesity, class II. Counseled on the importance of diet and exercise.   DVT prophylaxis: eliquis Code Status: Full code Family Communication: No Family present Disposition Plan: Discharge home once improved, possibly in a.m.   Consultants:   Cardiology  Procedures:     Antimicrobials:      Subjective: Shortness of breath improving. No palpitations.  Objective: Vitals:   11/13/16 0913 11/13/16 1000 11/13/16 1109 11/13/16 1442  BP:  98/61    Pulse:  78 74   Resp:  18 (!) 23   Temp:   98.6 F (37 C)  TempSrc:   Oral   SpO2: (!) 87% 97% 96% 97%  Weight:      Height:        Intake/Output Summary (Last 24 hours)  at 11/13/16 1543 Last data filed at 11/13/16 0846  Gross per 24 hour  Intake           723.75 ml  Output                0 ml  Net           723.75 ml   Filed Weights   11/11/16 0500 11/12/16 0500 11/13/16 0500  Weight: 111.4 kg (245 lb 9.5 oz) 107.6 kg (237 lb 3.4 oz) 107.6 kg (237 lb 3.4 oz)    Examination:  General exam: Appears calm and comfortable  Respiratory system: bilateral crackles. Respiratory effort normal. Cardiovascular system: S1 & S2 heard, irregular. No JVD, murmurs, rubs, gallops or clicks. trace pedal edema. Gastrointestinal system: Abdomen is nondistended, soft and nontender. No organomegaly or masses felt. Normal bowel sounds heard. Central nervous system: Alert and oriented. No focal neurological deficits. Extremities: Symmetric 5 x 5 power. Skin: No rashes, lesions or ulcers Psychiatry: Judgement and insight appear normal. Mood & affect appropriate.     Data Reviewed: I have personally reviewed following labs and imaging studies  CBC:  Recent Labs Lab 11/10/16 1745 11/11/16 0539 11/12/16 0536  WBC 11.1* 10.9* 9.4  NEUTROABS 8.1*  --   --   HGB 12.0 12.0 12.1  HCT 39.2 38.4 39.4  MCV 96.8 95.3 95.4  PLT 210 218 818   Basic Metabolic Panel:  Recent Labs Lab 11/10/16 1900 11/11/16 0539  NA 140 142  K 5.1 3.8  CL 99* 98*  CO2 33* 36*  GLUCOSE 110* 104*  BUN 14 13  CREATININE 1.09* 0.97  CALCIUM 9.5 9.2   GFR: Estimated Creatinine Clearance: 63.4 mL/min (by C-G formula based on SCr of 0.97 mg/dL). Liver Function Tests:  Recent Labs Lab 11/11/16 0539  AST 27  ALT 32  ALKPHOS 56  BILITOT 0.7  PROT 7.4  ALBUMIN 3.6   No results for input(s): LIPASE, AMYLASE in the last 168 hours. No results for input(s): AMMONIA in the last 168 hours. Coagulation Profile:  Recent Labs Lab 11/10/16 2115  INR 1.21   Cardiac Enzymes:  Recent Labs Lab 11/10/16 1900  TROPONINI <0.03   BNP (last 3 results) No results for input(s): PROBNP  in the last 8760 hours. HbA1C: No results for input(s): HGBA1C in the last 72 hours. CBG: No results for input(s): GLUCAP in the last 168 hours. Lipid Profile: No results for input(s): CHOL, HDL, LDLCALC, TRIG, CHOLHDL, LDLDIRECT in the last 72 hours. Thyroid Function Tests:  Recent Labs  11/10/16 2115  TSH 2.402   Anemia Panel: No results for input(s): VITAMINB12, FOLATE, FERRITIN, TIBC, IRON, RETICCTPCT in the last 72 hours. Sepsis Labs: No results for input(s): PROCALCITON, LATICACIDVEN in the last 168 hours.  Recent Results (from the past 240 hour(s))  Urine culture     Status: None   Collection Time: 11/10/16  5:48 PM  Result Value Ref Range Status   Specimen Description URINE, CLEAN CATCH  Final   Special Requests NONE  Final   Culture   Final    NO GROWTH Performed at [facility], [address] [place], [place] [address]    Report Status 11/12/2016 FINAL  Final  MRSA PCR Screening     Status: None  Collection Time: 11/11/16  1:00 AM  Result Value Ref Range Status   MRSA by PCR NEGATIVE NEGATIVE Final    Comment:        The GeneXpert MRSA Assay (FDA approved for NASAL specimens only), is one component of a comprehensive MRSA colonization surveillance program. It is not intended to diagnose MRSA infection nor to guide or monitor treatment for MRSA infections.          Radiology Studies: No results found.      Scheduled Meds: . apixaban  5 mg Oral BID  . atorvastatin  20 mg Oral q1800  . calcium carbonate  625 mg Oral BID WC  . cholecalciferol  400 Units Oral Daily  . diltiazem  180 mg Oral Daily  . fluticasone  1 spray Each Nare BID  . furosemide  40 mg Oral Daily  . ipratropium  0.5 mg Nebulization Q8H  . levalbuterol  0.63 mg Nebulization Q8H  . levothyroxine  100 mcg Oral QAC breakfast  . metoprolol succinate  50 mg Oral Daily  . olopatadine  1 drop Both Eyes BID  . pantoprazole  40 mg Oral Daily  . predniSONE  10 mg Oral Daily   . trimethoprim  100 mg Oral Daily  . umeclidinium bromide  1 puff Inhalation Daily  . cyanocobalamin  500 mcg Oral Daily  . vitamin C  500 mg Oral Daily  . Vitamin D (Ergocalciferol)  50,000 Units Oral Q7 days   Continuous Infusions:   LOS: 3 days    Time spent: 74mins    Chisa Kushner, MD [facility] Pager [phone]  If 7PM-7AM, please contact night-coverage [email or web address] Password [facility] 11/13/2016, 3:43 PM

## 2016-11-13 NOTE — Progress Notes (Signed)
Progress Note  Patient Name: Lorraine Leonard Date of Encounter: 11/13/2016  Primary Cardiologist: Dr. Minus Breeding   Subjective   Complaints of constipation. No chest pain or palpitations this morning.  Inpatient Medications    Scheduled Meds: . apixaban  5 mg Oral BID  . atorvastatin  20 mg Oral q1800  . calcium carbonate  625 mg Oral BID WC  . cholecalciferol  400 Units Oral Daily  . diltiazem  180 mg Oral Daily  . fluticasone  1 spray Each Nare BID  . furosemide  40 mg Oral Daily  . ipratropium  0.5 mg Nebulization Q8H  . levalbuterol  0.63 mg Nebulization Q8H  . levothyroxine  100 mcg Oral QAC breakfast  . metoprolol succinate  50 mg Oral Daily  . olopatadine  1 drop Both Eyes BID  . pantoprazole  40 mg Oral Daily  . predniSONE  10 mg Oral Daily  . trimethoprim  100 mg Oral Daily  . umeclidinium bromide  1 puff Inhalation Daily  . cyanocobalamin  500 mcg Oral Daily  . vitamin C  500 mg Oral Daily  . Vitamin D (Ergocalciferol)  50,000 Units Oral Q7 days    PRN Meds: diazepam, levalbuterol, ondansetron **OR** ondansetron (ZOFRAN) IV, ondansetron, traMADol   Vital Signs    Vitals:   11/13/16 0500 11/13/16 0636 11/13/16 0700 11/13/16 0716  BP:  103/66 110/63   Pulse:   (!) 118 86  Resp:   18 16  Temp:    98.2 F (36.8 C)  TempSrc:    Oral  SpO2:   93% 96%  Weight: 237 lb 3.4 oz (107.6 kg)     Height:        Intake/Output Summary (Last 24 hours) at 11/13/16 0906 Last data filed at 11/13/16 0846  Gross per 24 hour  Intake           723.75 ml  Output                0 ml  Net           723.75 ml   Filed Weights   11/11/16 0500 11/12/16 0500 11/13/16 0500  Weight: 245 lb 9.5 oz (111.4 kg) 237 lb 3.4 oz (107.6 kg) 237 lb 3.4 oz (107.6 kg)    Telemetry    Atrial fibrillation with rates in the 80's and 90;s. Personally Reviewed.  Physical Exam   GEN: No acute distress.   Neck: No JVD Cardiac: IRRR, no murmurs, rubs, or gallops.  Respiratory: Some  crackles in the right base and middle lobe. No wheezing or coughing.  GI: Soft, nontender, non-distended  MS: No edema; No deformity. Neuro:  Nonfocal Very Hard of hearing.  Psych: Normal affect   Labs    Chemistry  Recent Labs Lab 11/10/16 1900 11/11/16 0539  NA 140 142  K 5.1 3.8  CL 99* 98*  CO2 33* 36*  GLUCOSE 110* 104*  BUN 14 13  CREATININE 1.09* 0.97  CALCIUM 9.5 9.2  PROT  --  7.4  ALBUMIN  --  3.6  AST  --  27  ALT  --  32  ALKPHOS  --  56  BILITOT  --  0.7  GFRNONAA 48* 55*  GFRAA 56* >60  ANIONGAP 8 8     Hematology  Recent Labs Lab 11/10/16 1745 11/11/16 0539 11/12/16 0536  WBC 11.1* 10.9* 9.4  RBC 4.05 4.03 4.13  HGB 12.0 12.0 12.1  HCT 39.2 38.4  39.4  MCV 96.8 95.3 95.4  MCH 29.6 29.8 29.3  MCHC 30.6 31.3 30.7  RDW 15.3 15.5 15.0  PLT 210 218 225    Cardiac Enzymes  Recent Labs Lab 11/10/16 1900  TROPONINI <0.03    BNP  Recent Labs Lab 11/10/16 1745  BNP 607.0*     Cardiac Studies   Echocardiogram: 09/24/2016 Study Conclusions  - Left ventricle: The cavity size was normal. Wall thickness was increased in a pattern of mild LVH. Systolic function was normal. The estimated ejection fraction was in the range of 60% to 65%. Wall motion was normal; there were no regional wall motion abnormalities. Features are consistent with a pseudonormal left ventricular filling pattern, with concomitant abnormal relaxation and increased filling pressure (grade 2 diastolic dysfunction). - Aortic valve: Mildly calcified annulus. Trileaflet. - Mitral valve: Calcified annulus. - Right atrium: Central venous pressure (est): 3 mm Hg. - Atrial septum: No defect or patent foramen ovale was identified. - Tricuspid valve: There was trivial regurgitation. - Pulmonary arteries: PA peak pressure: 25 mm Hg (S). - Pericardium, extracardiac: A prominent pericardial fat pad was present.  Impressions:  - Mild LVH with LVEF 60-65%  and grade 2 diastolic dysfunction. Calcified mitral annulus. Mildly sclerotic aortic valve. Trivial tricuspid regurgitation with normal PASP estimated 25 mmHg. Prominent pericardial fat pad noted.   Patient Profile     76 y.o. female with PMH of HTN, HLD, Hypothyroidins, Pulmonary Fibrosis (on 3L [place] at baseline), Type 2 DM and GERD who presented to [facility] ED on worsening dyspnea and palpitations. Found to be in atrial fibrillation with RVR and Cardiology was consulted to assist with management.  Assessment & Plan    1. Atrial fibrillation with RVR: Heart rate is better controlled now. She was loaded with digoxin IV, but will not start po per Dr. Harl Bowie. Change to long acting diltiazem (now on 60 mg Q 8 hours), therefore start 180 mg diltiazem CD Continue metoprolol 50 mg XL daily,  Eliquis 5 mg BID, CHADS VASC Score of 5.   Plan for OP appointment to discuss DCCV with Dr. Percival Spanish as OP.Follow up appointment has been made. Please see discharge instructions.   2. Acute on Chronic Diastolic CHF:  Appears euvolemic today.  Positive on I/O but no urine output has been recorded. Weight is down from 245 to 237 since admission. Continue she is on lasix 40 mg IV BID. Will change to po, 40 mg daily.   3. Hypertension: BP is soft today.  4. COPD: O2 dependent. Follow up with pulmonary.   5. Type II Diabetes; Followed by PCP   Signed, Phill Myron. West Pugh, ANP, AACC   11/13/2016, 9:06 AM     Attending note:  Patient seen and examined. Reviewed consultation and follow-up by Dr. Harl Bowie. Agree with above assessment by Ms. West Pugh. Ms. Rekowski presents with newly documented atrial fibrillation associated with RVR, heart rate now better controlled on combination of calcium channel blocker and beta blocker. She has been placed on Eliquis for stroke prophylaxis with CHADSVASC score of 5.  On examination she appears comfortable this morning, no palpitations or chest pain. Recent heart rates in  the 80s to 90s in atrial fibrillation by telemetry, blood pressure stable. Lungs exhibit decreased breath sounds, particularly right base, without wheezing. Troponin I levels were negative. BNP 607 and initial chest x-ray consistent with fluid overload and right pleural effusion. She has diuresed well as noted above.  Patient presents with newly documented atrial fibrillation  with RVR and also acute on chronic diastolic heart failure. She has clinically improved and heart rate is better controlled. Agree with transition to Cardizem CD 180 mg daily, otherwise continue current dose of Toprol-XL. Would not start Lanoxin as an outpatient. She is on Eliquis for stroke prophylaxis, would not resume aspirin as an outpatient. Convert to Lasix 40 mg daily. We will arrange a follow-up visit with Dr. Percival Spanish to discuss elective cardioversion, presuming she does not convert spontaneously in the interim.  Satira Sark, M.D., F.A.C.C.

## 2016-11-14 ENCOUNTER — Institutional Professional Consult (permissible substitution): Payer: Medicare Other | Admitting: Emergency Medicine

## 2016-11-14 LAB — CBC
HCT: 41.9 % (ref 36.0–46.0)
Hemoglobin: 13.3 g/dL (ref 12.0–15.0)
MCH: 29.7 pg (ref 26.0–34.0)
MCHC: 31.7 g/dL (ref 30.0–36.0)
MCV: 93.5 fL (ref 78.0–100.0)
Platelets: 267 10*3/uL (ref 150–400)
RBC: 4.48 MIL/uL (ref 3.87–5.11)
RDW: 14.7 % (ref 11.5–15.5)
WBC: 11.9 10*3/uL — ABNORMAL HIGH (ref 4.0–10.5)

## 2016-11-14 LAB — BASIC METABOLIC PANEL
Anion gap: 10 (ref 5–15)
BUN: 25 mg/dL — ABNORMAL HIGH (ref 6–20)
CO2: 36 mmol/L — ABNORMAL HIGH (ref 22–32)
Calcium: 9.5 mg/dL (ref 8.9–10.3)
Chloride: 95 mmol/L — ABNORMAL LOW (ref 101–111)
Creatinine, Ser: 1.27 mg/dL — ABNORMAL HIGH (ref 0.44–1.00)
GFR calc Af Amer: 46 mL/min — ABNORMAL LOW (ref >60)
GFR calc non Af Amer: 40 mL/min — ABNORMAL LOW (ref >60)
Glucose, Bld: 99 mg/dL (ref 65–99)
Potassium: 3.7 mmol/L (ref 3.5–5.1)
Sodium: 141 mmol/L (ref 135–145)

## 2016-11-14 MED ORDER — DILTIAZEM HCL ER COATED BEADS 180 MG PO CP24: 180.0000 mg | ORAL_CAPSULE | Freq: Every day | ORAL | 0 refills | Status: DC

## 2016-11-14 MED ORDER — FUROSEMIDE 40 MG PO TABS: 40.0000 mg | ORAL_TABLET | Freq: Every day | ORAL | 1 refills | Status: DC

## 2016-11-14 MED ORDER — METOPROLOL SUCCINATE ER 50 MG PO TB24
75.0000 mg | ORAL_TABLET | Freq: Every day | ORAL | Status: DC
  Administered 2016-11-14: 75 mg via ORAL
  Filled 2016-11-14: qty 1

## 2016-11-14 MED ORDER — APIXABAN 5 MG PO TABS
5.0000 mg | ORAL_TABLET | Freq: Two times a day (BID) | ORAL | 1 refills | Status: DC
Start: 2016-11-14 — End: 2017-01-22

## 2016-11-14 MED ORDER — BISACODYL 5 MG PO TBEC
5.0000 mg | DELAYED_RELEASE_TABLET | Freq: Every day | ORAL | Status: DC | PRN
  Administered 2016-11-14: 5 mg via ORAL
  Filled 2016-11-14: qty 1

## 2016-11-14 MED ORDER — METOPROLOL SUCCINATE ER 25 MG PO TB24: 75.0000 mg | ORAL_TABLET | Freq: Every day | ORAL | 1 refills | Status: DC

## 2016-11-14 NOTE — Care Management Note (Signed)
Case Management Note  Patient Details  Name: Lorraine Leonard MRN: [id number] Date of Birth: 1940/07/24  Expected Discharge Date:  11/14/16               Expected Discharge Plan:  [place]  In-House Referral:  NA  Discharge planning Services  CM Consult  Post Acute Care Choice:  Home Health, Resumption of Svcs/PTA Provider Choice offered to:     [facility] Arranged:  RN Grace Agency:  [place]  Status of Service:  Completed, signed off  Additional Comments: Discharging home today. Pt's home oxygen at bedside for transport home. He husband coming to pick her up. Pt aware HH has 48hrs to resume services. Brad, [facility] rep, aware of DC home today and will obtain pt info from chart.   Sherald Barge, RN 11/14/2016, 12:29 PM

## 2016-11-14 NOTE — Progress Notes (Signed)
Patient discharged home. IVs removed and sites intact. Patient discharged with personal belongings and prescriptions.

## 2016-11-14 NOTE — Progress Notes (Signed)
Progress Note  Patient Name: Lorraine Leonard Date of Encounter: 11/14/2016  Primary Cardiologist: Hochrein   Subjective   Complaints of constipation.  Inpatient Medications    Scheduled Meds: . apixaban  5 mg Oral BID  . atorvastatin  20 mg Oral q1800  . calcium carbonate  625 mg Oral BID WC  . cholecalciferol  400 Units Oral Daily  . diltiazem  180 mg Oral Daily  . fluticasone  1 spray Each Nare BID  . furosemide  40 mg Oral Daily  . ipratropium  0.5 mg Nebulization TID  . levalbuterol  0.63 mg Nebulization TID  . levothyroxine  100 mcg Oral QAC breakfast  . metoprolol succinate  50 mg Oral Daily  . olopatadine  1 drop Both Eyes BID  . pantoprazole  40 mg Oral Daily  . predniSONE  10 mg Oral Daily  . trimethoprim  100 mg Oral Daily  . umeclidinium bromide  1 puff Inhalation Daily  . cyanocobalamin  500 mcg Oral Daily  . vitamin C  500 mg Oral Daily  . Vitamin D (Ergocalciferol)  50,000 Units Oral Q7 days   Continuous Infusions:  PRN Meds: diazepam, levalbuterol, ondansetron **OR** ondansetron (ZOFRAN) IV, ondansetron, traMADol   Vital Signs    Vitals:   11/13/16 2237 11/14/16 0505 11/14/16 0729 11/14/16 0733  BP:  136/64    Pulse:  76    Resp:  16    Temp:  98.4 F (36.9 C)    TempSrc:  Oral    SpO2: 96% 99% 98% 98%  Weight:  236 lb 3.2 oz (107.1 kg)    Height:        Intake/Output Summary (Last 24 hours) at 11/14/16 0754 Last data filed at 11/14/16 0655  Gross per 24 hour  Intake              480 ml  Output              400 ml  Net               80 ml   Filed Weights   11/12/16 0500 11/13/16 0500 11/14/16 0505  Weight: 237 lb 3.4 oz (107.6 kg) 237 lb 3.4 oz (107.6 kg) 236 lb 3.2 oz (107.1 kg)    Telemetry    Atrial fib with rates in the 100-120 bpm.   Physical Exam   GEN: No acute distress.   Neck: No JVD Cardiac: RRR, no murmurs, rubs, or gallops.  Respiratory: Inspiratory crackles,  No wheezing or coughing.  GI: Soft, nontender,  non-distended  MS: No edema; No deformity. Neuro:  Nonfocal  Psych: Normal affect   Labs    Chemistry Recent Labs Lab 11/10/16 1900 11/11/16 0539 11/14/16 0419  NA 140 142 141  K 5.1 3.8 3.7  CL 99* 98* 95*  CO2 33* 36* 36*  GLUCOSE 110* 104* 99  BUN 14 13 25*  CREATININE 1.09* 0.97 1.27*  CALCIUM 9.5 9.2 9.5  PROT  --  7.4  --   ALBUMIN  --  3.6  --   AST  --  27  --   ALT  --  32  --   ALKPHOS  --  56  --   BILITOT  --  0.7  --   GFRNONAA 48* 55* 40*  GFRAA 56* >60 46*  ANIONGAP 8 8 10      Hematology Recent Labs Lab 11/11/16 0539 11/12/16 0536 11/14/16 0419  WBC 10.9* 9.4 11.9*  RBC 4.03 4.13 4.48  HGB 12.0 12.1 13.3  HCT 38.4 39.4 41.9  MCV 95.3 95.4 93.5  MCH 29.8 29.3 29.7  MCHC 31.3 30.7 31.7  RDW 15.5 15.0 14.7  PLT 218 225 267    Cardiac Enzymes Recent Labs Lab 11/10/16 1900  TROPONINI <0.03   No results for input(s): TROPIPOC in the last 168 hours.   BNP Recent Labs Lab 11/10/16 1745  BNP 607.0*     DDimer No results for input(s): DDIMER in the last 168 hours.   Radiology    No results found.  Cardiac Studies  Echocardiogram: 09/24/2016 Study Conclusions  - Left ventricle: The cavity size was normal. Wall thickness was increased in a pattern of mild LVH. Systolic function was normal. The estimated ejection fraction was in the range of 60% to 65%. Wall motion was normal; there were no regional wall motion abnormalities. Features are consistent with a pseudonormal left ventricular filling pattern, with concomitant abnormal relaxation and increased filling pressure (grade 2 diastolic dysfunction). - Aortic valve: Mildly calcified annulus. Trileaflet. - Mitral valve: Calcified annulus. - Right atrium: Central venous pressure (est): 3 mm Hg. - Atrial septum: No defect or patent foramen ovale was identified. - Tricuspid valve: There was trivial regurgitation. - Pulmonary arteries: PA peak pressure: 25 mm Hg (S). -  Pericardium, extracardiac: A prominent pericardial fat pad was present.  Impressions:  - Mild LVH with LVEF 60-65% and grade 2 diastolic dysfunction. Calcified mitral annulus. Mildly sclerotic aortic valve. Trivial tricuspid regurgitation with normal PASP estimated 25 mmHg. Prominent pericardial fat pad noted.    Patient Profile     76 y.o. female with PMH of HTN, HLD, Hypothyroidins, Pulmonary Fibrosis (on 3L [place] at baseline), Type 2 DM and GERD who presented to [facility] on worsening dyspnea and palpitations. Found to be in atrial fibrillation with RVR and Cardiology was consulted to assist with management.  Assessment & Plan    1. Atrial fib with RVR: Heart rate is up today and overnight. She is asymptomatic with this. She did have digoxin loading but this has not been continued po. Has been changed long acting Diltiazem CD 180 mg daily, and continues on metoprolol succinate at 50 mg daily. BP is stable. Will increase metoprolol to 75 mg daily to assist with better HR control instead of going up on long acting Diltiazem. She will continue Eliquis 5 mg daily. She already has a follow up appointment with Dr. Percival Spanish schedule and placed in the follow up instructions.   2, Acute on Chronic Diastolic CHF: She appears euvolemic with weight today at 236 lbs. Down from 245 lbs on admission. Has been changed to po lasix 40 mg daily. Creatinine 1.27, up from 0.97 yesterday. Will need follow up BMET as OP.   3. Hypertension: Stable.   4. COPD: O2 Dependent. Follow up with pulmonary for ongoing management.,  5.Type II Diabetes: To be followed by PCP  6. Constipation: Dulcolax stool softener ordered today.   Signed, Phill Myron. West Pugh, ANP, AACC   11/14/2016, 7:54 AM    Attending Note Patient seen and discussed with DNP Purcell Nails, I agree with her documentation. Admitted with new diagnosis of afib, presented with RVR. She is now on dilt 180 and Toprol XL 75mg  for rate control,  started on eliquis for stroke prevention. Can consider cardioversion as outpatient after 3 weeks if remains in afib. Presented with diastolic HF, she has been transitioned to oral diuretics. Will need BMET/Mg at f/u, has  had some Cr elevation this admission. Rates elevated this AM, we will increase Toprol XL to75mg  this AM. She remains ok for discharge this AM, meds can be further adjusted as outpatient.   Carlyle Dolly MD

## 2016-11-14 NOTE — Discharge Summary (Signed)
Physician Discharge Summary  Lorraine Leonard GGY:[id number] DOB: May 26, 1940 DOA: 11/10/2016  PCP: Sharion Balloon, FNP  Admit date: 11/10/2016 Discharge date: 11/14/2016  Admitted From: home Disposition:  home  Recommendations for Outpatient Follow-up:  1. Follow up with PCP in 1-2 weeks 2. Please obtain BMP/CBC in one week 3. Patient will follow up with primary cardiologist, Dr. Percival Spanish  Discharge Condition:Stable CODE STATUS:Full code Diet recommendation: Heart Healthy   Brief/Interim Summary: 76 year old female with a history of pulmonary fibrosis, chronic respiratory failure on 3 L at home, hypertension, impaired glucose tolerance, hypothyroidism, hyperlipidemiapresented with one-week history of progressive shortness of breath and PND type symptoms. Patient states that she has had 3-4 day history of worsening lower extremity edema. She has been having intermittent chest discomfort and palpitations over the past 3-4 days. She denies any dizziness, nausea, vomiting, diarrhea, fevers, chills. She continues to have chronic nonproductive cough. The patient was recently discharged from the hospital after a stay from 09/23/2016 through 09/25/2016 secondary to progression/exacerbation of her underlying pulmonary interstitial lung disease. She was discharged home with 3 L nasal cannula at that time. CT chest angioon 09/23/2016 was negative pulmonary embolus.   The patient had previously seen pulmonary medicine, Dr. Elie Goody to interstitial and nodular changes on CT in the left lower lobe. She had continue to follow Dr. Lamonte Sakai on an annual basis for her interstitial lung disease and history of rheumatoid arthritis. There wasa question whether the patient actually had rheumatoid arthritis. She followed up with rheumatology, Dr. Gavin Pound. Per the patient, she was told that "more or less, I did not have rheumatoid arthritis". Nevertheless,the patient saw her primary care provider  on the day of admission, and she was found to have atrial fibrillation with RVR. She was sent to the emergency department for further evaluation  Discharge Diagnoses:  Active Problems:   Essential hypertension   Hypothyroidism   Vitamin D deficiency   Single kidney   Pulmonary hypertension (HCC)   Obesity, Class II, BMI 35-39.9   Pulmonary fibrosis (HCC)   Atrial fibrillation with RVR (HCC)   Acute on chronic respiratory failure with hypoxia (HCC)   Acute diastolic CHF (congestive heart failure) (HCC)   CKD (chronic kidney disease) stage 2, GFR 60-89 ml/min  1. Acute on chronic respiratory failure with hypoxia. Secondary to decompensated CHF/pulmonary fibrosis/interstitial lung disease. Baseline oxygen requirement is 3 L at home. Currently on 4 L. we'll continue oxygen 4 L on discharge. 2. Acute diastolic congestive heart failure. Clinically improved with IV Lasix. Weight at the time of discharge is 236 pounds. She has been transitioned to oral Lasix. She'll be followed up as an outpatient 3. Atrial fibrillation with rapid ventricular response. Previously required Cardizem infusion, but has since been transitioned to oral Cardizem. She is also on Toprol. Heart rate remained stable. Anticoagulated with eliquis. Can consider DCCV as an outpatient in the next few weeks if she remains in atrial fibrillation. 4. 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 Renal function has been stable. Continue to monitor setting of diuresis. Will need repeat basic metabolic panel in 1 week 5. Pulmonary fibrosis. Followed by Dr. Lamonte Sakai. Continue on maintenance prednisone. Continue bronchodilators. Follow-up with pulmonology as an outpatient 6. Hypertension. Blood pressure is currently controlled on Toprol and Cardizem. Continue to monitor. 7. Hyperlipidemia. Continue statin 8. Hypothyroidism. Continue Synthroid 9. Obesity, class II. Counseled on the importance of diet and exercise.  Discharge  Instructions  Discharge Instructions    Diet - low  sodium heart healthy    Complete by:  As directed    Increase activity slowly    Complete by:  As directed      Allergies as of 11/14/2016      Reactions   Allopurinol    Mobic [meloxicam] Swelling, Other (See Comments)   Leg swelling      Medication List    TAKE these medications   albuterol 108 (90 Base) MCG/ACT inhaler Commonly known as:  PROVENTIL HFA;VENTOLIN HFA Inhale 2 puffs into the lungs every 6 (six) hours as needed for wheezing or shortness of breath.   apixaban 5 MG Tabs tablet Commonly known as:  ELIQUIS Take 1 tablet (5 mg total) by mouth 2 (two) times daily.   aspirin EC 81 MG tablet Take 81 mg by mouth daily.   azelastine 0.1 % nasal spray Commonly known as:  ASTELIN PLACE 2 SPRAYS INTO BOTH NOSTRILS 2 (TWO) TIMES DAILY AS NEEDED. What changed:  See the new instructions.   calcium citrate 950 MG tablet Commonly known as:  CALCITRATE - dosed in mg elemental calcium Take 1 tablet by mouth 2 (two) times daily.   Cholecalciferol 2000 units Caps Take 2,000 Units by mouth daily.   conjugated estrogens vaginal cream Commonly known as:  PREMARIN Place 0.5 g vaginally every Thursday. Use as directed   cyanocobalamin 500 MCG tablet Take 500 mcg by mouth daily.   DEXILANT 60 MG capsule Generic drug:  dexlansoprazole TAKE 1 CAPSULE (60 MG TOTAL) BY MOUTH DAILY.   diazepam 5 MG tablet Commonly known as:  VALIUM TAKE 1 TABLET TWICE A DAY AS NEEDED FOR ANXIETY What changed:  how much to take  how to take this  when to take this  reasons to take this  additional instructions   diltiazem 180 MG 24 hr capsule Commonly known as:  CARDIZEM CD Take 1 capsule (180 mg total) by mouth daily.   EPINEPHrine 0.3 mg/0.3 mL Soaj injection Commonly known as:  EPI-PEN Inject 0.3 mg into the muscle once as needed (For anaphylaxis.).   fluticasone 50 MCG/ACT nasal spray Commonly known as:  FLONASE PLACE 1  SPRAY INTO BOTH NOSTRILS 2 (TWO) TIMES DAILY AS NEEDED FOR ALLERGIES OR RHINITIS. What changed:  when to take this   furosemide 40 MG tablet Commonly known as:  LASIX Take 1 tablet (40 mg total) by mouth daily.   INCRUSE ELLIPTA 62.5 MCG/INH Aepb Generic drug:  umeclidinium bromide TAKE 1 PUFF BY MOUTH EVERY DAY   levocetirizine 5 MG tablet Commonly known as:  XYZAL Take 5 mg by mouth at bedtime as needed for allergies.   levothyroxine 100 MCG tablet Commonly known as:  SYNTHROID, LEVOTHROID TAKE 1 TABLET (100 MCG TOTAL) BY MOUTH DAILY BEFORE BREAKFAST.   metoprolol succinate 25 MG 24 hr tablet Commonly known as:  TOPROL XL Take 3 tablets (75 mg total) by mouth daily. What changed:  how much to take   mometasone 0.1 % cream Commonly known as:  ELOCON APPLY TO AFFECTED AREA DAILY   ondansetron 8 MG tablet Commonly known as:  ZOFRAN Take 8 mg by mouth every 8 (eight) hours as needed for nausea or vomiting.   OVER THE COUNTER MEDICATION Place 1 drop into both eyes as needed (For dry eyes.). Systane Gel Drops   PATADAY 0.2 % Soln Generic drug:  Olopatadine HCl Place 1 drop into both eyes daily.   polyethylene glycol powder powder Commonly known as:  GLYCOLAX/MIRALAX TAKE 17 G BY MOUTH  2 (TWO) TIMES DAILY AS NEEDED.   predniSONE 10 MG tablet Commonly known as:  DELTASONE Take 10 mg by mouth daily.   simvastatin 40 MG tablet Commonly known as:  ZOCOR TAKE 1 TABLET (40 MG TOTAL) BY MOUTH AT BEDTIME.   traMADol 50 MG tablet Commonly known as:  ULTRAM TAKE 1 TABLET BY MOUTH EVERY 6 HOURS AS NEEDED What changed:  See the new instructions.   trimethoprim 100 MG tablet Commonly known as:  TRIMPEX Take 100 mg by mouth daily.   VIGAMOX 0.5 % ophthalmic solution Generic drug:  moxifloxacin Apply 1 drop to eye as needed (Instill into affected eye four times daily for 2 days after each eye injection for macular degeneration.).   vitamin C 500 MG tablet Commonly known  as:  ASCORBIC ACID Take 500 mg by mouth daily.   Vitamin D (Ergocalciferol) 50000 units Caps capsule Commonly known as:  DRISDOL TAKE 1 CAPSULE (50,000 UNITS TOTAL) BY MOUTH EVERY 7 (SEVEN) DAYS.   vitamin E 400 UNIT capsule Take 400 Units by mouth daily.      Follow-up Information    Minus Breeding, MD Follow up on 12/17/2016.   Specialty:  Cardiology Why:  2:20 pm  Contact information: [place] [address] [phone]        [facility] Follow up.   Contact information: [place] [address] [phone]          Allergies  Allergen Reactions  . Allopurinol   . Mobic [Meloxicam] Swelling and Other (See Comments)    Leg swelling    Consultations:  Cardiology   Procedures/Studies: Dg Chest Port 1 View  Result Date: 11/10/2016 CLINICAL DATA:  Shortness of breath and tachycardia for 2 days, initial encounter EXAM: PORTABLE CHEST 1 VIEW COMPARISON:  09/23/2016 FINDINGS: Cardiac shadow is mildly enlarged. Mild vascular congestion is noted new from the prior exam. Small bilateral pleural effusions are noted right greater than left with likely underlying atelectasis. No bony abnormality is seen. IMPRESSION: Changes consistent with CHF and prominent right pleural effusion. Electronically Signed   By: Inez Catalina M.D.   On: 11/10/2016 17:54       Subjective: Feeling better. No shortness of breath or chest pain.  Discharge Exam: Vitals:   11/13/16 2210 11/14/16 0505  BP: (!) 144/84 136/64  Pulse: (!) 106 76  Resp: 18 16  Temp: 97.7 F (36.5 C) 98.4 F (36.9 C)   Vitals:   11/14/16 0505 11/14/16 0729 11/14/16 0733 11/14/16 1354  BP: 136/64     Pulse: 76     Resp: 16     Temp: 98.4 F (36.9 C)     TempSrc: Oral     SpO2: 99% 98% 98% 96%  Weight: 107.1 kg (236 lb 3.2 oz)     Height:        General: Pt is alert, awake, not in acute distress Cardiovascular: irregular, S1/S2 +, no rubs, no  gallops Respiratory: coarse breath sounds bilaterally Abdominal: Soft, NT, ND, bowel sounds + Extremities: no edema, no cyanosis    The results of significant diagnostics from this hospitalization (including imaging, microbiology, ancillary and laboratory) are listed below for reference.     Microbiology: Recent Results (from the past 240 hour(s))  Urine culture     Status: None   Collection Time: 11/10/16  5:48 PM  Result Value Ref Range Status   Specimen Description URINE, CLEAN CATCH  Final   Special  Requests NONE  Final   Culture   Final    NO GROWTH Performed at [facility], [place] [address] [place], [place] [address]    Report Status 11/12/2016 FINAL  Final  MRSA PCR Screening     Status: None   Collection Time: 11/11/16  1:00 AM  Result Value Ref Range Status   MRSA by PCR NEGATIVE NEGATIVE Final    Comment:        The GeneXpert MRSA Assay (FDA approved for NASAL specimens only), is one component of a comprehensive MRSA colonization surveillance program. It is not intended to diagnose MRSA infection nor to guide or monitor treatment for MRSA infections.      Labs: BNP (last 3 results)  Recent Labs  09/23/16 1955 11/10/16 1745  BNP 313.0* 867.6*   Basic Metabolic Panel:  Recent Labs Lab 11/10/16 1900 11/11/16 0539 11/14/16 0419  NA 140 142 141  K 5.1 3.8 3.7  CL 99* 98* 95*  CO2 33* 36* 36*  GLUCOSE 110* 104* 99  BUN 14 13 25*  CREATININE 1.09* 0.97 1.27*  CALCIUM 9.5 9.2 9.5   Liver Function Tests:  Recent Labs Lab 11/11/16 0539  AST 27  ALT 32  ALKPHOS 56  BILITOT 0.7  PROT 7.4  ALBUMIN 3.6   No results for input(s): LIPASE, AMYLASE in the last 168 hours. No results for input(s): AMMONIA in the last 168 hours. CBC:  Recent Labs Lab 11/10/16 1745 11/11/16 0539 11/12/16 0536 11/14/16 0419  WBC 11.1* 10.9* 9.4 11.9*  NEUTROABS 8.1*  --   --   --   HGB 12.0 12.0 12.1 13.3  HCT 39.2 38.4 39.4 41.9  MCV 96.8 95.3 95.4  93.5  PLT 210 218 225 267   Cardiac Enzymes:  Recent Labs Lab 11/10/16 1900  TROPONINI <0.03   BNP: Invalid input(s): POCBNP CBG: No results for input(s): GLUCAP in the last 168 hours. D-Dimer No results for input(s): DDIMER in the last 72 hours. Hgb A1c No results for input(s): HGBA1C in the last 72 hours. Lipid Profile No results for input(s): CHOL, HDL, LDLCALC, TRIG, CHOLHDL, LDLDIRECT in the last 72 hours. Thyroid function studies No results for input(s): TSH, T4TOTAL, T3FREE, THYROIDAB in the last 72 hours.  Invalid input(s): FREET3 Anemia work up No results for input(s): VITAMINB12, FOLATE, FERRITIN, TIBC, IRON, RETICCTPCT in the last 72 hours. Urinalysis    Component Value Date/Time   COLORURINE COLORLESS (A) 11/10/2016 1748   APPEARANCEUR CLEAR 11/10/2016 1748   LABSPEC 1.004 (L) 11/10/2016 1748   PHURINE 7.0 11/10/2016 1748   GLUCOSEU NEGATIVE 11/10/2016 1748   HGBUR NEGATIVE 11/10/2016 1748   BILIRUBINUR NEGATIVE 11/10/2016 1748   BILIRUBINUR neg 04/06/2015 1517   KETONESUR NEGATIVE 11/10/2016 1748   PROTEINUR NEGATIVE 11/10/2016 1748   UROBILINOGEN negative 04/06/2015 1517   UROBILINOGEN 0.2 05/14/2011 1942   NITRITE NEGATIVE 11/10/2016 1748   LEUKOCYTESUR NEGATIVE 11/10/2016 1748   Sepsis Labs Invalid input(s): PROCALCITONIN,  WBC,  LACTICIDVEN Microbiology Recent Results (from the past 240 hour(s))  Urine culture     Status: None   Collection Time: 11/10/16  5:48 PM  Result Value Ref Range Status   Specimen Description URINE, CLEAN CATCH  Final   Special Requests NONE  Final   Culture   Final    NO GROWTH Performed at [facility], [place] [address] [place], [place] [address]    Report Status 11/12/2016 FINAL  Final  MRSA PCR Screening  Status: None   Collection Time: 11/11/16  1:00 AM  Result Value Ref Range Status   MRSA by PCR NEGATIVE NEGATIVE Final    Comment:        The GeneXpert MRSA Assay (FDA approved for NASAL  specimens only), is one component of a comprehensive MRSA colonization surveillance program. It is not intended to diagnose MRSA infection nor to guide or monitor treatment for MRSA infections.      Time coordinating discharge: Over 30 minutes  SIGNED:   Kathie Dike, MD  [facility] 11/14/2016, 7:06 PM Pager   If 7PM-7AM, please contact night-coverage [email or web address] Password [email or web address]

## 2016-11-14 NOTE — Care Management Important Message (Signed)
Important Message  Patient Details  Name: Lorraine Leonard MRN: [id number] Date of Birth: [birth year]   Medicare Important Message Given:  Yes    Sherald Barge, RN 11/14/2016, 12:31 PM

## 2016-11-15 DIAGNOSIS — H3530 Unspecified macular degeneration: Secondary | ICD-10-CM | POA: Diagnosis not present

## 2016-11-15 DIAGNOSIS — J849 Interstitial pulmonary disease, unspecified: Secondary | ICD-10-CM | POA: Diagnosis not present

## 2016-11-15 DIAGNOSIS — I10 Essential (primary) hypertension: Secondary | ICD-10-CM | POA: Diagnosis not present

## 2016-11-15 DIAGNOSIS — K219 Gastro-esophageal reflux disease without esophagitis: Secondary | ICD-10-CM | POA: Diagnosis not present

## 2016-11-15 DIAGNOSIS — J8410 Pulmonary fibrosis, unspecified: Secondary | ICD-10-CM | POA: Diagnosis not present

## 2016-11-15 DIAGNOSIS — I272 Pulmonary hypertension, unspecified: Secondary | ICD-10-CM | POA: Diagnosis not present

## 2016-11-17 ENCOUNTER — Other Ambulatory Visit: Payer: Self-pay | Admitting: Family

## 2016-11-17 DIAGNOSIS — I272 Pulmonary hypertension, unspecified: Secondary | ICD-10-CM | POA: Diagnosis not present

## 2016-11-17 DIAGNOSIS — K219 Gastro-esophageal reflux disease without esophagitis: Secondary | ICD-10-CM | POA: Diagnosis not present

## 2016-11-17 DIAGNOSIS — H3530 Unspecified macular degeneration: Secondary | ICD-10-CM | POA: Diagnosis not present

## 2016-11-17 DIAGNOSIS — R110 Nausea: Secondary | ICD-10-CM

## 2016-11-17 DIAGNOSIS — R1013 Epigastric pain: Secondary | ICD-10-CM

## 2016-11-17 DIAGNOSIS — J849 Interstitial pulmonary disease, unspecified: Secondary | ICD-10-CM | POA: Diagnosis not present

## 2016-11-17 DIAGNOSIS — I10 Essential (primary) hypertension: Secondary | ICD-10-CM | POA: Diagnosis not present

## 2016-11-17 DIAGNOSIS — J8410 Pulmonary fibrosis, unspecified: Secondary | ICD-10-CM | POA: Diagnosis not present

## 2016-11-18 ENCOUNTER — Ambulatory Visit (INDEPENDENT_AMBULATORY_CARE_PROVIDER_SITE_OTHER): Payer: Medicare Other | Admitting: Family Medicine

## 2016-11-18 DIAGNOSIS — I272 Pulmonary hypertension, unspecified: Secondary | ICD-10-CM | POA: Diagnosis not present

## 2016-11-18 DIAGNOSIS — I10 Essential (primary) hypertension: Secondary | ICD-10-CM

## 2016-11-18 DIAGNOSIS — J849 Interstitial pulmonary disease, unspecified: Secondary | ICD-10-CM | POA: Diagnosis not present

## 2016-11-18 DIAGNOSIS — H3530 Unspecified macular degeneration: Secondary | ICD-10-CM | POA: Diagnosis not present

## 2016-11-18 DIAGNOSIS — J8410 Pulmonary fibrosis, unspecified: Secondary | ICD-10-CM | POA: Diagnosis not present

## 2016-11-18 DIAGNOSIS — K219 Gastro-esophageal reflux disease without esophagitis: Secondary | ICD-10-CM | POA: Diagnosis not present

## 2016-11-20 ENCOUNTER — Other Ambulatory Visit: Payer: Self-pay | Admitting: Family

## 2016-11-20 DIAGNOSIS — J8410 Pulmonary fibrosis, unspecified: Secondary | ICD-10-CM | POA: Diagnosis not present

## 2016-11-20 DIAGNOSIS — I10 Essential (primary) hypertension: Secondary | ICD-10-CM | POA: Diagnosis not present

## 2016-11-20 DIAGNOSIS — K219 Gastro-esophageal reflux disease without esophagitis: Secondary | ICD-10-CM | POA: Diagnosis not present

## 2016-11-20 DIAGNOSIS — I272 Pulmonary hypertension, unspecified: Secondary | ICD-10-CM | POA: Diagnosis not present

## 2016-11-20 DIAGNOSIS — H3530 Unspecified macular degeneration: Secondary | ICD-10-CM | POA: Diagnosis not present

## 2016-11-20 DIAGNOSIS — J849 Interstitial pulmonary disease, unspecified: Secondary | ICD-10-CM | POA: Diagnosis not present

## 2016-11-21 NOTE — Telephone Encounter (Signed)
Last Vit D 10/19/15  26.8 

## 2016-11-24 ENCOUNTER — Other Ambulatory Visit: Payer: Self-pay | Admitting: Family Medicine

## 2016-11-24 ENCOUNTER — Telehealth: Payer: Self-pay | Admitting: Family

## 2016-11-24 DIAGNOSIS — J849 Interstitial pulmonary disease, unspecified: Secondary | ICD-10-CM | POA: Diagnosis not present

## 2016-11-24 DIAGNOSIS — J8410 Pulmonary fibrosis, unspecified: Secondary | ICD-10-CM | POA: Diagnosis not present

## 2016-11-24 DIAGNOSIS — K219 Gastro-esophageal reflux disease without esophagitis: Secondary | ICD-10-CM | POA: Diagnosis not present

## 2016-11-24 DIAGNOSIS — I10 Essential (primary) hypertension: Secondary | ICD-10-CM | POA: Diagnosis not present

## 2016-11-24 DIAGNOSIS — R110 Nausea: Secondary | ICD-10-CM

## 2016-11-24 DIAGNOSIS — H3530 Unspecified macular degeneration: Secondary | ICD-10-CM | POA: Diagnosis not present

## 2016-11-24 DIAGNOSIS — I272 Pulmonary hypertension, unspecified: Secondary | ICD-10-CM | POA: Diagnosis not present

## 2016-11-24 NOTE — Telephone Encounter (Signed)
Aware to folow provider's instructions.

## 2016-11-24 NOTE — Telephone Encounter (Signed)
Pt to continue Lasix 40 mg daily and she can take 20 mg extra today. Pt needs to keep follow up with Cardiologists. Report any weight gain of 3 lbs in one day or 5 lbs in a week.   Low salt diet.

## 2016-11-25 ENCOUNTER — Telehealth: Payer: Self-pay | Admitting: Cardiology

## 2016-11-25 ENCOUNTER — Telehealth: Payer: Self-pay | Admitting: Family

## 2016-11-25 NOTE — Telephone Encounter (Signed)
New Message     How long is the patients suppose to take the increased dosage of the Lasix ?

## 2016-11-25 NOTE — Telephone Encounter (Signed)
Pt aware to follow low salt diet and to report weight gain of 3 pounds in a day or 5 pounds in a week. Pt voiced understanding.

## 2016-11-25 NOTE — Telephone Encounter (Signed)
Patient had spoken with her PCP office yesterday and they advised for her to take an extra 20 mg of lasix yesterday, she took today. Advised only take the one extra dose. Patient HHN called the PCP office regarding swelling. Advised patient to monitor her daily weight and call office if weight gain of 3 pounds in 24 hours or 5 pounds in 1 week and to limit her sodium intakeHHN seeing patient twice a week  Keep follow up appointment 8/29

## 2016-11-26 DIAGNOSIS — J8410 Pulmonary fibrosis, unspecified: Secondary | ICD-10-CM | POA: Diagnosis not present

## 2016-11-26 DIAGNOSIS — K219 Gastro-esophageal reflux disease without esophagitis: Secondary | ICD-10-CM | POA: Diagnosis not present

## 2016-11-26 DIAGNOSIS — I10 Essential (primary) hypertension: Secondary | ICD-10-CM | POA: Diagnosis not present

## 2016-11-26 DIAGNOSIS — I272 Pulmonary hypertension, unspecified: Secondary | ICD-10-CM | POA: Diagnosis not present

## 2016-11-26 DIAGNOSIS — H3530 Unspecified macular degeneration: Secondary | ICD-10-CM | POA: Diagnosis not present

## 2016-11-26 DIAGNOSIS — J849 Interstitial pulmonary disease, unspecified: Secondary | ICD-10-CM | POA: Diagnosis not present

## 2016-11-26 NOTE — Telephone Encounter (Signed)
Agree 

## 2016-11-27 DIAGNOSIS — I5031 Acute diastolic (congestive) heart failure: Secondary | ICD-10-CM | POA: Diagnosis not present

## 2016-11-27 DIAGNOSIS — F329 Major depressive disorder, single episode, unspecified: Secondary | ICD-10-CM | POA: Diagnosis not present

## 2016-11-27 DIAGNOSIS — H3530 Unspecified macular degeneration: Secondary | ICD-10-CM | POA: Diagnosis not present

## 2016-11-27 DIAGNOSIS — J8410 Pulmonary fibrosis, unspecified: Secondary | ICD-10-CM | POA: Diagnosis not present

## 2016-11-27 DIAGNOSIS — Z905 Acquired absence of kidney: Secondary | ICD-10-CM | POA: Diagnosis not present

## 2016-11-27 DIAGNOSIS — Z7982 Long term (current) use of aspirin: Secondary | ICD-10-CM | POA: Diagnosis not present

## 2016-11-27 DIAGNOSIS — R7303 Prediabetes: Secondary | ICD-10-CM | POA: Diagnosis not present

## 2016-11-27 DIAGNOSIS — Z9981 Dependence on supplemental oxygen: Secondary | ICD-10-CM | POA: Diagnosis not present

## 2016-11-27 DIAGNOSIS — K219 Gastro-esophageal reflux disease without esophagitis: Secondary | ICD-10-CM | POA: Diagnosis not present

## 2016-11-27 DIAGNOSIS — Z524 Kidney donor: Secondary | ICD-10-CM | POA: Diagnosis not present

## 2016-11-27 DIAGNOSIS — J849 Interstitial pulmonary disease, unspecified: Secondary | ICD-10-CM | POA: Diagnosis not present

## 2016-11-27 DIAGNOSIS — I272 Pulmonary hypertension, unspecified: Secondary | ICD-10-CM | POA: Diagnosis not present

## 2016-11-27 DIAGNOSIS — M069 Rheumatoid arthritis, unspecified: Secondary | ICD-10-CM | POA: Diagnosis not present

## 2016-11-27 DIAGNOSIS — I130 Hypertensive heart and chronic kidney disease with heart failure and stage 1 through stage 4 chronic kidney disease, or unspecified chronic kidney disease: Secondary | ICD-10-CM | POA: Diagnosis not present

## 2016-11-27 DIAGNOSIS — J9621 Acute and chronic respiratory failure with hypoxia: Secondary | ICD-10-CM | POA: Diagnosis not present

## 2016-11-27 DIAGNOSIS — F419 Anxiety disorder, unspecified: Secondary | ICD-10-CM | POA: Diagnosis not present

## 2016-11-27 DIAGNOSIS — N182 Chronic kidney disease, stage 2 (mild): Secondary | ICD-10-CM | POA: Diagnosis not present

## 2016-12-01 ENCOUNTER — Encounter: Payer: Self-pay | Admitting: Cardiology

## 2016-12-01 ENCOUNTER — Telehealth: Payer: Self-pay | Admitting: Family

## 2016-12-01 NOTE — Telephone Encounter (Signed)
Pt is currently taking 40 mg. Pt needs to be on low salt diet, compression hose, and keep feet elevated. She also needs to be monitoring her weight and report any weight gain of 5 lbs or greater in a week or 3lbs in a day.   If this swelling continues pt will need to be seen.

## 2016-12-01 NOTE — Telephone Encounter (Signed)
Patient notified of Christys suggestions. Patient verbalized understanding and will keep [place] informed on weight.

## 2016-12-05 DIAGNOSIS — I130 Hypertensive heart and chronic kidney disease with heart failure and stage 1 through stage 4 chronic kidney disease, or unspecified chronic kidney disease: Secondary | ICD-10-CM | POA: Diagnosis not present

## 2016-12-05 DIAGNOSIS — J849 Interstitial pulmonary disease, unspecified: Secondary | ICD-10-CM | POA: Diagnosis not present

## 2016-12-05 DIAGNOSIS — I272 Pulmonary hypertension, unspecified: Secondary | ICD-10-CM | POA: Diagnosis not present

## 2016-12-05 DIAGNOSIS — J8410 Pulmonary fibrosis, unspecified: Secondary | ICD-10-CM | POA: Diagnosis not present

## 2016-12-05 DIAGNOSIS — N182 Chronic kidney disease, stage 2 (mild): Secondary | ICD-10-CM | POA: Diagnosis not present

## 2016-12-05 DIAGNOSIS — I5031 Acute diastolic (congestive) heart failure: Secondary | ICD-10-CM | POA: Diagnosis not present

## 2016-12-09 DIAGNOSIS — I130 Hypertensive heart and chronic kidney disease with heart failure and stage 1 through stage 4 chronic kidney disease, or unspecified chronic kidney disease: Secondary | ICD-10-CM | POA: Diagnosis not present

## 2016-12-09 DIAGNOSIS — N182 Chronic kidney disease, stage 2 (mild): Secondary | ICD-10-CM | POA: Diagnosis not present

## 2016-12-09 DIAGNOSIS — I272 Pulmonary hypertension, unspecified: Secondary | ICD-10-CM | POA: Diagnosis not present

## 2016-12-09 DIAGNOSIS — J849 Interstitial pulmonary disease, unspecified: Secondary | ICD-10-CM | POA: Diagnosis not present

## 2016-12-09 DIAGNOSIS — J8410 Pulmonary fibrosis, unspecified: Secondary | ICD-10-CM | POA: Diagnosis not present

## 2016-12-09 DIAGNOSIS — I5031 Acute diastolic (congestive) heart failure: Secondary | ICD-10-CM | POA: Diagnosis not present

## 2016-12-10 ENCOUNTER — Encounter (INDEPENDENT_AMBULATORY_CARE_PROVIDER_SITE_OTHER): Payer: Medicare Other | Admitting: Ophthalmology

## 2016-12-15 ENCOUNTER — Other Ambulatory Visit: Payer: Self-pay | Admitting: Family

## 2016-12-16 ENCOUNTER — Telehealth: Payer: Self-pay | Admitting: Family

## 2016-12-16 NOTE — Telephone Encounter (Signed)
appt scheduled for hospital follow up 

## 2016-12-16 NOTE — Progress Notes (Signed)
Cardiology Office Note   Date:  12/17/2016   ID:  Lorraine Leonard, DOB [birth year], MRN [id number]  PCP:  Sharion Balloon, FNP  Cardiologist:   Minus Breeding, MD    Chief Complaint  Patient presents with  . Atrial Fibrillation      History of Present Illness: Lorraine Leonard is a 76 y.o. female who presents for follow up of atrial fib.  She was in the hospital with this this month.  I reviewed these records for this visit.  She has had respiratory failure with pulmonary fibrosis and interstitial lung disease.  She also has had diastolic HF.  I saw her last in 2014 for evaluation of chest pain.  She had a negative Lexiscan Myoview.    She was seen by our service for the atrial fib.  It appears that this was new.  She had an echo which was essentially normal.    She had meds for rate control and was started on Eliquis.  She presents for follow up.  Of note she has chronic dyspnea and she's very limited by chronic fatigue. She is on the oxygen all the time. She doesn't really notice tachypalpitations. She's not had any presyncope or syncope. She's had no new chest pressure, neck or arm discomfort. She's been compliant with her medications. She has noticed some dark stools. She hasn't noticed any red blood.   Past Medical History:  Diagnosis Date  . Acute diastolic CHF (congestive heart failure) ([place]) 11/11/2016  . Allergic rhinitis   . Anxiety   . Anxiety   . Depression   . Diabetes mellitus    pre-diabetic  . Diverticulosis   . GERD (gastroesophageal reflux disease)   . Hyperlipidemia   . Hypertension   . Hypothyroidism   . Lung fibrosis ([place])   . Macular degeneration   . Rheumatoid arthritis(714.0)   . Urticaria     Past Surgical History:  Procedure Laterality Date  . EUS N/A 10/03/2015   Procedure: UPPER ENDOSCOPIC ULTRASOUND (EUS) RADIAL;  Surgeon: Arta Silence, MD;  Location: [facility];  Service: Endoscopy;  Laterality: N/A;  . FINE NEEDLE ASPIRATION N/A 10/03/2015   Procedure: FINE NEEDLE ASPIRATION (FNA) RADIAL;  Surgeon: Arta Silence, MD;  Location: [facility];  Service: Endoscopy;  Laterality: N/A;  . 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 LUMBAR DISC SURGERY     x 2  . TUBAL LIGATION    . VAGINAL HYSTERECTOMY       Current Outpatient Prescriptions  Medication Sig Dispense Refill  . albuterol (PROVENTIL HFA;VENTOLIN HFA) 108 (90 Base) MCG/ACT inhaler Inhale 2 puffs into the lungs every 6 (six) hours as needed for wheezing or shortness of breath. 1 Inhaler 2  . apixaban (ELIQUIS) 5 MG TABS tablet Take 1 tablet (5 mg total) by mouth 2 (two) times daily. 60 tablet 1  . azelastine (ASTELIN) 0.1 % nasal spray PLACE 2 SPRAYS INTO BOTH NOSTRILS 2 (TWO) TIMES DAILY AS NEEDED. (Patient taking differently: PLACE 2 SPRAYS INTO BOTH NOSTRILS AT BEDTIME) 30 mL 4  . calcium citrate (CALCITRATE - DOSED IN MG ELEMENTAL CALCIUM) 950 MG tablet Take 1 tablet by mouth 2 (two) times daily.     . Cholecalciferol 2000 units CAPS Take 2,000 Units by mouth daily.     Marland Kitchen conjugated estrogens (PREMARIN) vaginal cream Place 0.5 g vaginally every Thursday. Use as directed    . cyanocobalamin 500 MCG tablet Take 500 mcg  by mouth daily.     Marland Kitchen DEXILANT 60 MG capsule TAKE 1 CAPSULE (60 MG TOTAL) BY MOUTH DAILY. 30 capsule 5  . diazepam (VALIUM) 5 MG tablet TAKE 1 TABLET TWICE A DAY AS NEEDED FOR ANXIETY (Patient taking differently: Take 5 mg by mouth 2 (two) times daily as needed for anxiety. ) 60 tablet 5  . diltiazem (CARDIZEM CD) 180 MG 24 hr capsule Take 1 capsule (180 mg total) by mouth daily. 30 capsule 0  . fluticasone (FLONASE) 50 MCG/ACT nasal spray PLACE 1 SPRAY INTO BOTH NOSTRILS 2 (TWO) TIMES DAILY AS NEEDED FOR ALLERGIES OR RHINITIS. (Patient taking differently: Place 1 spray into both nostrils 2 (two) times daily. ) 16 g 5  . furosemide (LASIX) 40 MG tablet Take 1 tablet (40 mg total) by mouth daily. 30 tablet 1  . INCRUSE ELLIPTA 62.5 MCG/INH AEPB TAKE 1  PUFF BY MOUTH EVERY DAY  3  . levocetirizine (XYZAL) 5 MG tablet Take 5 mg by mouth at bedtime as needed for allergies.     Marland Kitchen levothyroxine (SYNTHROID, LEVOTHROID) 100 MCG tablet TAKE 1 TABLET (100 MCG TOTAL) BY MOUTH DAILY BEFORE BREAKFAST. 90 tablet 1  . metoprolol succinate (TOPROL-XL) 100 MG 24 hr tablet Take 1 tablet (100 mg total) by mouth at bedtime. 90 tablet 3  . mometasone (ELOCON) 0.1 % cream APPLY TO AFFECTED AREA DAILY 45 g 2  . ondansetron (ZOFRAN) 8 MG tablet Take 8 mg by mouth every 8 (eight) hours as needed for nausea or vomiting.    Marland Kitchen OVER THE COUNTER MEDICATION Place 1 drop into both eyes as needed (For dry eyes.). Systane Gel Drops    . PATADAY 0.2 % SOLN Place 1 drop into both eyes daily.    . polyethylene glycol powder (GLYCOLAX/MIRALAX) powder TAKE 17 G BY MOUTH 2 (TWO) TIMES DAILY AS NEEDED. 3162 g 0  . rosuvastatin (CRESTOR) 10 MG tablet TAKE 1 TABLET BY MOUTH AT BEDTIME 30 tablet 0  . rosuvastatin (CRESTOR) 10 MG tablet Take 10 mg by mouth at bedtime.  0  . traMADol (ULTRAM) 50 MG tablet TAKE 1 TABLET BY MOUTH EVERY 6 HOURS AS NEEDED (Patient taking differently: TAKE 1 TABLET BY MOUTH EVERY 6 HOURS AS NEEDED FOR PAIN) 90 tablet 2  . trimethoprim (TRIMPEX) 100 MG tablet Take 100 mg by mouth daily.    . vitamin C (ASCORBIC ACID) 500 MG tablet Take 500 mg by mouth daily.    . Vitamin D, Ergocalciferol, (DRISDOL) 50000 units CAPS capsule TAKE 1 CAPSULE (50,000 UNITS TOTAL) BY MOUTH EVERY 7 (SEVEN) DAYS. 12 capsule 0  . vitamin E 400 UNIT capsule Take 400 Units by mouth daily.    Marland Kitchen EPINEPHrine 0.3 mg/0.3 mL IJ SOAJ injection Inject 0.3 mg into the muscle once as needed (For anaphylaxis.).    Marland Kitchen VIGAMOX 0.5 % ophthalmic solution Apply 1 drop to eye as needed (Instill into affected eye four times daily for 2 days after each eye injection for macular degeneration.).   12   No current facility-administered medications for this visit.     Allergies:   Allopurinol and Mobic  [meloxicam]     ROS:  Please see the history of present illness.   Otherwise, review of systems are positive for Insomnia diaphoresis at night.   All other systems are reviewed and negative.    PHYSICAL EXAM: VS:  BP 124/84   Pulse (!) 128   Ht 5\' 8"  (1.727 m)   Wt 249  lb (112.9 kg)   BMI 37.86 kg/m  , BMI Body mass index is 37.86 kg/m. GENERAL:  Chronically ill appearing HEENT:  Pupils equal round and reactive, fundi not visualized, oral mucosa unremarkable NECK:  No jugular venous distention, waveform within normal limits, carotid upstroke brisk and symmetric, no bruits, no thyromegaly LYMPHATICS:  No cervical, inguinal adenopathy LUNGS:  Clear to auscultation bilaterally BACK:  No CVA tenderness CHEST:  Unremarkable HEART:  PMI not displaced or sustained,S1 and S2 within normal limits, no S3,  no clicks, no rubs, no murmurs, irregular ABD:  Flat, positive bowel sounds normal in frequency in pitch, no bruits, no rebound, no guarding, no midline pulsatile mass, no hepatomegaly, no splenomegaly, obese EXT:  2 plus pulses throughout, trace edema, no cyanosis no clubbing SKIN:  No rashes no nodules NEURO:  Cranial nerves II through XII grossly intact, motor grossly intact throughout PSYCH:  Cognitively intact, oriented to person place and time    EKG:  EKG is ordered today. The ekg ordered today demonstrates atrial fibrillation with rapid ventricular rate, 128, QTC prolonged, no acute ST-T wave changes.   Recent Labs: 09/25/2016: Magnesium 2.0 11/10/2016: B Natriuretic Peptide 607.0; TSH 2.402 11/11/2016: ALT 32 11/14/2016: BUN 25; Creatinine, Ser 1.27; Hemoglobin 13.3; Platelets 267; Potassium 3.7; Sodium 141    Lipid Panel    Component Value Date/Time   CHOL 135 07/17/2016 1501   TRIG 148 07/17/2016 1501   TRIG 169 (H) 08/17/2014 1136   HDL 36 (L) 07/17/2016 1501   HDL 44 08/17/2014 1136   CHOLHDL 3.8 07/17/2016 1501   LDLCALC 69 07/17/2016 1501      Wt Readings from  Last 3 Encounters:  12/17/16 249 lb (112.9 kg)  11/14/16 236 lb 3.2 oz (107.1 kg)  10/06/16 238 lb (108 kg)      Other studies Reviewed: Additional studies/ records that were reviewed today include: Extensive hospital records. Review of the above records demonstrates:  Please see elsewhere in the note.     ASSESSMENT AND PLAN:   ATRIAL FIB:   Lorraine Leonard has a CHA2DS2 - VASc score of 6.    I think rate control be difficult. Increase her metoprolol to 100 mg daily. I'm going to set her up for cardioversion. She has been taking her Eliquis.  I will send her to get a CBC and stool guaiac today as she mentions some black stools. She should not be cardioverted until these results are back as she may not be able to continue the anticoagulation if she has any signs of active bleeding.  ACUTE ON CHRONIC DIASTOLIC HF:  She seems to be euvolemic today. She will continue the meds as listed.  CKD STAGE II:  This can be followed by her primary provider.  She is on the appropriate dose of anticoagulation.  PULMONARY FIBROSIS:  I have facilitated follow-up with Dr. Lamonte Sakai  HTN:  This is well controlled.  She will continue the meds as listed.   OBESITY:  She needs weight loss which will be difficult given the physical limitations.     Current medicines are reviewed at length with the patient today.  The patient does not have concerns regarding medicines.  The following changes have been made:  As above  Labs/ tests ordered today include:   Orders Placed This Encounter  Procedures  . CBC  . POC Hemoccult Bld/Stl (3-Cd Home Screen)  . EKG 12-Lead     Disposition:   FU with me after  cardioversion.      Signed, Minus Breeding, MD  12/17/2016 3:36 PM    [facility]

## 2016-12-17 ENCOUNTER — Ambulatory Visit (INDEPENDENT_AMBULATORY_CARE_PROVIDER_SITE_OTHER): Payer: Medicare Other | Admitting: Cardiology

## 2016-12-17 ENCOUNTER — Encounter: Payer: Self-pay | Admitting: Cardiology

## 2016-12-17 ENCOUNTER — Other Ambulatory Visit: Payer: Medicare Other

## 2016-12-17 VITALS — BP 124/84 | HR 128 | Ht 68.0 in | Wt 249.0 lb

## 2016-12-17 DIAGNOSIS — I4819 Other persistent atrial fibrillation: Secondary | ICD-10-CM

## 2016-12-17 DIAGNOSIS — N182 Chronic kidney disease, stage 2 (mild): Secondary | ICD-10-CM | POA: Diagnosis not present

## 2016-12-17 DIAGNOSIS — I10 Essential (primary) hypertension: Secondary | ICD-10-CM

## 2016-12-17 DIAGNOSIS — K921 Melena: Secondary | ICD-10-CM

## 2016-12-17 DIAGNOSIS — Z79899 Other long term (current) drug therapy: Secondary | ICD-10-CM

## 2016-12-17 DIAGNOSIS — J8410 Pulmonary fibrosis, unspecified: Secondary | ICD-10-CM | POA: Diagnosis not present

## 2016-12-17 DIAGNOSIS — J849 Interstitial pulmonary disease, unspecified: Secondary | ICD-10-CM | POA: Diagnosis not present

## 2016-12-17 DIAGNOSIS — I481 Persistent atrial fibrillation: Secondary | ICD-10-CM | POA: Diagnosis not present

## 2016-12-17 DIAGNOSIS — I272 Pulmonary hypertension, unspecified: Secondary | ICD-10-CM | POA: Diagnosis not present

## 2016-12-17 DIAGNOSIS — I130 Hypertensive heart and chronic kidney disease with heart failure and stage 1 through stage 4 chronic kidney disease, or unspecified chronic kidney disease: Secondary | ICD-10-CM | POA: Diagnosis not present

## 2016-12-17 DIAGNOSIS — I480 Paroxysmal atrial fibrillation: Secondary | ICD-10-CM | POA: Insufficient documentation

## 2016-12-17 DIAGNOSIS — I5031 Acute diastolic (congestive) heart failure: Secondary | ICD-10-CM | POA: Diagnosis not present

## 2016-12-17 MED ORDER — METOPROLOL SUCCINATE ER 100 MG PO TB24: 100.0000 mg | ORAL_TABLET | Freq: Every day | ORAL | 3 refills | Status: DC

## 2016-12-17 NOTE — Patient Instructions (Addendum)
Medication Instructions:  Please discontinue your aspirin. Increase your Metoprolol to 100 mg at bedtime. Continue all other medications as listed.   Labwork: Please have blood work today (CBC/stool cards) at [facility].  Testing/Procedures: Your physician has requested that you have a Cardioversion.  Electrical Cardioversion uses a jolt of electricity to your heart either through paddles or wired patches attached to your chest. This is a controlled, usually prescheduled, procedure. This procedure is done at the hospital and you are not awake during the procedure. You usually go home the day of the procedure.  This will be completed at [facility] with Dr Harl Bowie.  Do not eat or drink after midnight.  You may take your medications with a sip of water the morning of your procedure.  You may hold you Furosemide this AM. Check in at Registration  Follow-Up: Follow up with Dr Lamonte Sakai as scheduled.  Follow up with Dr Percival Spanish in [place] after your cardioversion.  If you need a refill on your cardiac medications before your next appointment, please call your pharmacy.  Thank you for choosing [facility]!!

## 2016-12-18 ENCOUNTER — Other Ambulatory Visit: Payer: Self-pay | Admitting: Family

## 2016-12-18 ENCOUNTER — Telehealth: Payer: Self-pay | Admitting: *Deleted

## 2016-12-18 ENCOUNTER — Other Ambulatory Visit: Payer: Medicare Other

## 2016-12-18 DIAGNOSIS — Z1211 Encounter for screening for malignant neoplasm of colon: Secondary | ICD-10-CM

## 2016-12-18 LAB — CBC
Hematocrit: 38.6 % (ref 34.0–46.6)
Hemoglobin: 12.4 g/dL (ref 11.1–15.9)
MCH: 29.2 pg (ref 26.6–33.0)
MCHC: 32.1 g/dL (ref 31.5–35.7)
MCV: 91 fL (ref 79–97)
Platelets: 259 10*3/uL (ref 150–379)
RBC: 4.25 x10E6/uL (ref 3.77–5.28)
RDW: 15.1 % (ref 12.3–15.4)
WBC: 12.4 10*3/uL — ABNORMAL HIGH (ref 3.4–10.8)

## 2016-12-18 NOTE — Telephone Encounter (Signed)
-----   Message from [name] sent at 12/18/2016  1:12 PM EDT ----- Regarding: RE: cardioversion Uvaldo Bristle,  Cardioversion scheduled for 12/26/16 @ 10:30am.  Patient needs to register at [facility] @ 9:00am. Pre op scheduled for 12/24/16 @ 3:15pm at [facility] as well.  Please have Dr. Percival Spanish place orders prior to pre op.  Thanks, Coralyn Mark ----- Message ----- From: Shellia Cleverly, RN Sent: 12/17/2016   3:20 PM To: Bertram Gala Goins Subject: cardioversion                                  Hey,  This pt needs to be scheduled for her out pt cardioversion with Dr Harl Bowie at [facility]. (per [facility] for At fib)  Can you schedule that and let her know the date and time or let me know and I will call her.  Thank you,  Pam

## 2016-12-18 NOTE — Telephone Encounter (Signed)
Pt aware of instructions, date and time for cardioversion.   Reviewed instructions with her again and she states she understands.  She has no further questions.

## 2016-12-19 ENCOUNTER — Institutional Professional Consult (permissible substitution): Payer: Medicare Other | Admitting: Emergency Medicine

## 2016-12-22 LAB — FECAL OCCULT BLOOD, IMMUNOCHEMICAL: Fecal Occult Bld: POSITIVE — AB

## 2016-12-23 NOTE — Patient Instructions (Signed)
Lorraine Leonard  12/23/2016     @PREFPERIOPPHARMACY @   Your procedure is scheduled on  12/26/2016   Report to [facility] at  900  A.M.  Call this number if you have problems the morning of surgery:  [phone]   Remember:  Do not eat food or drink liquids after midnight.  Take these medicines the morning of surgery with A SIP OF WATER  Dexilant, valium, cardiazem, levothyroxine, zofran, ultram. Use your inhalers before you come. DO NOT miss any doses of eliquis.   Do not wear jewelry, make-up or nail polish.  Do not wear lotions, powders, or perfumes, or deoderant.  Do not shave 48 hours prior to surgery.  Men may shave face and neck.  Do not bring valuables to the hospital.  [facility] is not responsible for any belongings or valuables.  Contacts, dentures or bridgework may not be worn into surgery.  Leave your suitcase in the car.  After surgery it may be brought to your room.  For patients admitted to the hospital, discharge time will be determined by your treatment team.  Patients discharged the day of surgery will not be allowed to drive home.   Name and phone number of your driver:  family Special instructions:  None  Please read over the following fact sheets that you were given. Anesthesia Post-op Instructions and Care and Recovery After Surgery       Electrical Cardioversion Electrical cardioversion is the delivery of a jolt of electricity to restore a normal rhythm to the heart. A rhythm that is too fast or is not regular keeps the heart from pumping well. In this procedure, sticky patches or metal paddles are placed on the chest to deliver electricity to the heart from a device. This procedure may be done in an emergency if:  There is low or no blood pressure as a result of the heart rhythm.  Normal rhythm must be restored as fast as possible to protect the brain and heart from further damage.  It may save a life.  This procedure may also be  done for irregular or fast heart rhythms that are not immediately life-threatening. Tell a health care provider about:  Any allergies you have.  All medicines you are taking, including vitamins, herbs, eye drops, creams, and over-the-counter medicines.  Any problems you or family members have had with anesthetic medicines.  Any blood disorders you have.  Any surgeries you have had.  Any medical conditions you have.  Whether you are pregnant or may be pregnant. What are the risks? Generally, this is a safe procedure. However, problems may occur, including:  Allergic reactions to medicines.  A blood clot that breaks free and travels to other parts of your body.  The possible return of an abnormal heart rhythm within hours or days after the procedure.  Your heart stopping (cardiac arrest). This is rare.  What happens before the procedure? Medicines  Your health care provider may have you start taking: ? Blood-thinning medicines (anticoagulants) so your blood does not clot as easily. ? Medicines may be given to help stabilize your heart rate and rhythm.  Ask your health care provider about changing or stopping your regular medicines. This is especially important if you are taking diabetes medicines or blood thinners. General instructions  Plan to have someone take you home from the hospital or clinic.  If you will be going home right after the procedure,  plan to have someone with you for 24 hours.  Follow instructions from your health care provider about eating or drinking restrictions. What happens during the procedure?  To lower your risk of infection: ? Your health care team will wash or sanitize their hands. ? Your skin will be washed with soap.  An IV tube will be inserted into one of your veins.  You will be given a medicine to help you relax (sedative).  Sticky patches (electrodes) or metal paddles may be placed on your chest.  An electrical shock will be  delivered. The procedure may vary among health care providers and hospitals. What happens after the procedure?  Your blood pressure, heart rate, breathing rate, and blood oxygen level will be monitored until the medicines you were given have worn off.  Do not drive for 24 hours if you were given a sedative.  Your heart rhythm will be watched to make sure it does not change. This information is not intended to replace advice given to you by your health care provider. Make sure you discuss any questions you have with your health care provider. Document Released: 03/28/2002 Document Revised: 12/05/2015 Document Reviewed: 10/12/2015 Elsevier Interactive Patient Education  2017 [employer].  Electrical Cardioversion, Care After This sheet gives you information about how to care for yourself after your procedure. Your health care provider may also give you more specific instructions. If you have problems or questions, contact your health care provider. What can I expect after the procedure? After the procedure, it is common to have:  Some redness on the skin where the shocks were given.  Follow these instructions at home:  Do not drive for 24 hours if you were given a medicine to help you relax (sedative).  Take over-the-counter and prescription medicines only as told by your health care provider.  Ask your health care provider how to check your pulse. Check it often.  Rest for 48 hours after the procedure or as told by your health care provider.  Avoid or limit your caffeine use as told by your health care provider. Contact a health care provider if:  You feel like your heart is beating too quickly or your pulse is not regular.  You have a serious muscle cramp that does not go away. Get help right away if:  You have discomfort in your chest.  You are dizzy or you feel faint.  You have trouble breathing or you are short of breath.  Your speech is slurred.  You have trouble  moving an arm or leg on one side of your body.  Your fingers or toes turn cold or blue. This information is not intended to replace advice given to you by your health care provider. Make sure you discuss any questions you have with your health care provider. Document Released: 01/26/2013 Document Revised: 11/09/2015 Document Reviewed: 10/12/2015 Elsevier Interactive Patient Education  2018 [place] Anesthesia is a term that refers to techniques, procedures, and medicines that help a person stay safe and comfortable during a medical procedure. Monitored anesthesia care, or sedation, is one type of anesthesia. Your anesthesia specialist may recommend sedation if you will be having a procedure that does not require you to be unconscious, such as:  Cataract surgery.  A dental procedure.  A biopsy.  A colonoscopy.  During the procedure, you may receive a medicine to help you relax (sedative). There are three levels of sedation:  Mild sedation. At this level, you  may feel awake and relaxed. You will be able to follow directions.  Moderate sedation. At this level, you will be sleepy. You may not remember the procedure.  Deep sedation. At this level, you will be asleep. You will not remember the procedure.  The more medicine you are given, the deeper your level of sedation will be. Depending on how you respond to the procedure, the anesthesia specialist may change your level of sedation or the type of anesthesia to fit your needs. An anesthesia specialist will monitor you closely during the procedure. Let your health care provider know about:  Any allergies you have.  All medicines you are taking, including vitamins, herbs, eye drops, creams, and over-the-counter medicines.  Any use of steroids (by mouth or as a cream).  Any problems you or family members have had with sedatives and anesthetic medicines.  Any blood disorders you have.  Any surgeries you  have had.  Any medical conditions you have, such as sleep apnea.  Whether you are pregnant or may be pregnant.  Any use of cigarettes, alcohol, or street drugs. What are the risks? Generally, this is a safe procedure. However, problems may occur, including:  Getting too much medicine (oversedation).  Nausea.  Allergic reaction to medicines.  Trouble breathing. If this happens, a breathing tube may be used to help with breathing. It will be removed when you are awake and breathing on your own.  Heart trouble.  Lung trouble.  Before the procedure Staying hydrated Follow instructions from your health care provider about hydration, which may include:  Up to 2 hours before the procedure - you may continue to drink clear liquids, such as water, clear fruit juice, black coffee, and plain tea.  Eating and drinking restrictions Follow instructions from your health care provider about eating and drinking, which may include:  8 hours before the procedure - stop eating heavy meals or foods such as meat, fried foods, or fatty foods.  6 hours before the procedure - stop eating light meals or foods, such as toast or cereal.  6 hours before the procedure - stop drinking milk or drinks that contain milk.  2 hours before the procedure - stop drinking clear liquids.  Medicines Ask your health care provider about:  Changing or stopping your regular medicines. This is especially important if you are taking diabetes medicines or blood thinners.  Taking medicines such as aspirin and ibuprofen. These medicines can thin your blood. Do not take these medicines before your procedure if your health care provider instructs you not to.  Tests and exams  You will have a physical exam.  You may have blood tests done to show: ? How well your kidneys and liver are working. ? How well your blood can clot.  General instructions  Plan to have someone take you home from the hospital or  clinic.  If you will be going home right after the procedure, plan to have someone with you for 24 hours.  What happens during the procedure?  Your blood pressure, heart rate, breathing, level of pain and overall condition will be monitored.  An IV tube will be inserted into one of your veins.  Your anesthesia specialist will give you medicines as needed to keep you comfortable during the procedure. This may mean changing the level of sedation.  The procedure will be performed. After the procedure  Your blood pressure, heart rate, breathing rate, and blood oxygen level will be monitored until the medicines you were  given have worn off.  Do not drive for 24 hours if you received a sedative.  You may: ? Feel sleepy, clumsy, or nauseous. ? Feel forgetful about what happened after the procedure. ? Have a sore throat if you had a breathing tube during the procedure. ? Vomit. This information is not intended to replace advice given to you by your health care provider. Make sure you discuss any questions you have with your health care provider. Document Released: 01/01/2005 Document Revised: 09/14/2015 Document Reviewed: 07/29/2015 Elsevier Interactive Patient Education  2018 [place], Care After These instructions provide you with information about caring for yourself after your procedure. Your health care provider may also give you more specific instructions. Your treatment has been planned according to current medical practices, but problems sometimes occur. Call your health care provider if you have any problems or questions after your procedure. What can I expect after the procedure? After your procedure, it is common to:  Feel sleepy for several hours.  Feel clumsy and have poor balance for several hours.  Feel forgetful about what happened after the procedure.  Have poor judgment for several hours.  Feel nauseous or vomit.  Have a sore throat  if you had a breathing tube during the procedure.  Follow these instructions at home: For at least 24 hours after the procedure:   Do not: ? Participate in activities in which you could fall or become injured. ? Drive. ? Use heavy machinery. ? Drink alcohol. ? Take sleeping pills or medicines that cause drowsiness. ? Make important decisions or sign legal documents. ? Take care of children on your own.  Rest. Eating and drinking  Follow the diet that is recommended by your health care provider.  If you vomit, drink water, juice, or soup when you can drink without vomiting.  Make sure you have little or no nausea before eating solid foods. General instructions  Have a responsible adult stay with you until you are awake and alert.  Take over-the-counter and prescription medicines only as told by your health care provider.  If you smoke, do not smoke without supervision.  Keep all follow-up visits as told by your health care provider. This is important. Contact a health care provider if:  You keep feeling nauseous or you keep vomiting.  You feel light-headed.  You develop a rash.  You have a fever. Get help right away if:  You have trouble breathing. This information is not intended to replace advice given to you by your health care provider. Make sure you discuss any questions you have with your health care provider. Document Released: 07/29/2015 Document Revised: 11/28/2015 Document Reviewed: 07/29/2015 Elsevier Interactive Patient Education  [employer].

## 2016-12-24 ENCOUNTER — Other Ambulatory Visit: Payer: Self-pay | Admitting: Cardiology

## 2016-12-24 ENCOUNTER — Encounter (HOSPITAL_COMMUNITY): Payer: Self-pay

## 2016-12-24 ENCOUNTER — Encounter (HOSPITAL_COMMUNITY)
Admission: RE | Admit: 2016-12-24 | Discharge: 2016-12-24 | Disposition: A | Discharge status: Home / Self Care | Payer: Medicare Other | Source: Ambulatory Visit | Attending: Cardiology | Admitting: Cardiology

## 2016-12-24 DIAGNOSIS — N182 Chronic kidney disease, stage 2 (mild): Secondary | ICD-10-CM | POA: Insufficient documentation

## 2016-12-24 DIAGNOSIS — Z79891 Long term (current) use of opiate analgesic: Secondary | ICD-10-CM | POA: Insufficient documentation

## 2016-12-24 DIAGNOSIS — I4891 Unspecified atrial fibrillation: Secondary | ICD-10-CM | POA: Insufficient documentation

## 2016-12-24 DIAGNOSIS — Z01812 Encounter for preprocedural laboratory examination: Secondary | ICD-10-CM | POA: Insufficient documentation

## 2016-12-24 DIAGNOSIS — I5033 Acute on chronic diastolic (congestive) heart failure: Secondary | ICD-10-CM | POA: Diagnosis not present

## 2016-12-24 DIAGNOSIS — I130 Hypertensive heart and chronic kidney disease with heart failure and stage 1 through stage 4 chronic kidney disease, or unspecified chronic kidney disease: Secondary | ICD-10-CM | POA: Insufficient documentation

## 2016-12-24 DIAGNOSIS — Z6837 Body mass index (BMI) 37.0-37.9, adult: Secondary | ICD-10-CM | POA: Diagnosis not present

## 2016-12-24 DIAGNOSIS — E669 Obesity, unspecified: Secondary | ICD-10-CM | POA: Diagnosis not present

## 2016-12-24 DIAGNOSIS — Z79899 Other long term (current) drug therapy: Secondary | ICD-10-CM | POA: Insufficient documentation

## 2016-12-24 HISTORY — DX: Dyspnea, unspecified: R06.00

## 2016-12-24 HISTORY — DX: Cardiac arrhythmia, unspecified: I49.9

## 2016-12-24 HISTORY — DX: Unspecified atrial fibrillation: I48.91

## 2016-12-24 HISTORY — DX: Pulmonary fibrosis, unspecified: J84.10

## 2016-12-24 LAB — BASIC METABOLIC PANEL
Anion gap: 10 (ref 5–15)
BUN: 16 mg/dL (ref 6–20)
CO2: 30 mmol/L (ref 22–32)
Calcium: 9.6 mg/dL (ref 8.9–10.3)
Chloride: 101 mmol/L (ref 101–111)
Creatinine, Ser: 0.86 mg/dL (ref 0.44–1.00)
GFR calc Af Amer: >60 mL/min (ref >60)
GFR calc non Af Amer: >60 mL/min (ref >60)
Glucose, Bld: 171 mg/dL — ABNORMAL HIGH (ref 65–99)
Potassium: 4.5 mmol/L (ref 3.5–5.1)
Sodium: 141 mmol/L (ref 135–145)

## 2016-12-25 ENCOUNTER — Encounter: Payer: Self-pay | Admitting: Family

## 2016-12-25 ENCOUNTER — Ambulatory Visit (INDEPENDENT_AMBULATORY_CARE_PROVIDER_SITE_OTHER): Payer: Medicare Other | Admitting: Family

## 2016-12-25 VITALS — BP 140/95 | HR 130 | Temp 97.2°F | Ht 68.0 in | Wt 254.6 lb

## 2016-12-25 DIAGNOSIS — I5031 Acute diastolic (congestive) heart failure: Secondary | ICD-10-CM

## 2016-12-25 DIAGNOSIS — J9601 Acute respiratory failure with hypoxia: Secondary | ICD-10-CM | POA: Diagnosis not present

## 2016-12-25 DIAGNOSIS — R195 Other fecal abnormalities: Secondary | ICD-10-CM

## 2016-12-25 DIAGNOSIS — E669 Obesity, unspecified: Secondary | ICD-10-CM | POA: Diagnosis not present

## 2016-12-25 DIAGNOSIS — I480 Paroxysmal atrial fibrillation: Secondary | ICD-10-CM | POA: Diagnosis not present

## 2016-12-25 DIAGNOSIS — J8410 Pulmonary fibrosis, unspecified: Secondary | ICD-10-CM | POA: Diagnosis not present

## 2016-12-25 NOTE — Progress Notes (Signed)
   Subjective:    Patient ID: Lorraine Leonard, female    DOB: 30-Aug-1940, 76 y.o.   MRN: [id number]  HPI Pt presents to the office today for hospital follow up. PT went to the ED on 11/10/16 for peripheral edema, chest pain, and new onset of A Fib. Pt was discharged on 11/14/16.  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 Pt is currently on 3 L  Of O2.    Pt is followed by Pulmonologist in a few weeks. Pt was seen by Dr. Percival Spanish on 12/17/16 and is scheduled for cardioversion 12/26/16. Pt taking Eliquis and metoprolol XL 100 mg daily.   Pt's FOBT positive, pt states she brought back another sample today.    Review of Systems  Respiratory: Positive for cough, shortness of breath and wheezing.   Cardiovascular: Positive for leg swelling. Negative for chest pain.  Genitourinary: Negative.   Musculoskeletal: Positive for back pain.  Neurological: Positive for weakness.  All other systems reviewed and are negative.      Objective:   Physical Exam  Constitutional: She is oriented to person, place, and time. She appears well-developed and well-nourished. No distress.  HENT:  Head: Normocephalic and atraumatic.  Right Ear: External ear normal.  Mouth/Throat: Oropharynx is clear and moist.  Eyes: Pupils are equal, round, and reactive to light.  Neck: Normal range of motion. Neck supple. No thyromegaly present.  Cardiovascular: Normal rate, regular rhythm, normal heart sounds and intact distal pulses.   No murmur heard. Pulmonary/Chest: Effort normal. No respiratory distress. She has decreased breath sounds. She has no wheezes.  3 L O2  Abdominal: Soft. Bowel sounds are normal. She exhibits no distension. There is no tenderness.  Musculoskeletal: Normal range of motion. She exhibits no edema or tenderness.  Generalized weakness  Neurological: She is alert and oriented to person, place, and time.  Skin: Skin is warm and dry.    Psychiatric: She has a normal mood and affect. Her behavior is normal. Judgment and thought content normal.  Vitals reviewed.    BP (!) 140/95   Pulse (!) 130   Temp (!) 97.2 F (36.2 C) (Oral)   Ht _0  (1.727 m)   Wt 254 lb 9.6 oz (115.5 kg)   BMI 38.71 kg/m      Assessment & Plan:  1. Pulmonary fibrosis (HCC)  - CMP14+EGFR - CBC with Differential/Platelet  2. Paroxysmal atrial fibrillation (HCC)  - CMP14+EGFR - CBC with Differential/Platelet  3. Obesity, Class II, BMI 35-39.9  - CMP14+EGFR - CBC with Differential/Platelet  4. Acute respiratory failure with hypoxia (HCC)  - CMP14+EGFR - CBC with Differential/Platelet  5. Acute diastolic CHF (congestive heart failure) (HCC) - CMP14+EGFR - CBC with Differential/Platelet  6. Fecal occult blood test positive - Fecal occult blood, imunochemical; Future - CMP14+EGFR - CBC with Differential/Platelet  Keep Cardioversion appt for tomorrow!! Avoid caffeine  Keep all Pulmonologist appt Continue oxygen- Currently 3L Labs pending   Evelina Dun, FNP

## 2016-12-25 NOTE — Addendum Note (Signed)
Addended by: Earlene Plater on: 12/25/2016 03:33 PM   Modules accepted: Orders

## 2016-12-25 NOTE — Patient Instructions (Signed)
Atrial Fibrillation Atrial fibrillation is a type of irregular or rapid heartbeat (arrhythmia). In atrial fibrillation, the heart quivers continuously in a chaotic pattern. This occurs when parts of the heart receive disorganized signals that make the heart unable to pump blood normally. This can increase the risk for stroke, heart failure, and other heart-related conditions. There are different types of atrial fibrillation, including:  Paroxysmal atrial fibrillation. This type starts suddenly, and it usually stops on its own shortly after it starts.  Persistent atrial fibrillation. This type often lasts longer than a week. It may stop on its own or with treatment.  Long-lasting persistent atrial fibrillation. This type lasts longer than 12 months.  Permanent atrial fibrillation. This type does not go away.  Talk with your health care provider to learn about the type of atrial fibrillation that you have. What are the causes? This condition is caused by some heart-related conditions or procedures, including:  A heart attack.  Coronary artery disease.  Heart failure.  Heart valve conditions.  High blood pressure.  Inflammation of the sac that surrounds the heart (pericarditis).  Heart surgery.  Certain heart rhythm disorders, such as Wolf-Parkinson-White syndrome.  Other causes include:  Pneumonia.  Obstructive sleep apnea.  Blockage of an artery in the lungs (pulmonary embolism, or PE).  Lung cancer.  Chronic lung disease.  Thyroid problems, especially if the thyroid is overactive (hyperthyroidism).  Caffeine.  Excessive alcohol use or illegal drug use.  Use of some medicines, including certain decongestants and diet pills.  Sometimes, the cause cannot be found. What increases the risk? This condition is more likely to develop in:  People who are older in age.  People who smoke.  People who have diabetes mellitus.  People who are overweight  (obese).  Athletes who exercise vigorously.  What are the signs or symptoms? Symptoms of this condition include:  A feeling that your heart is beating rapidly or irregularly.  A feeling of discomfort or pain in your chest.  Shortness of breath.  Sudden light-headedness or weakness.  Getting tired easily during exercise.  In some cases, there are no symptoms. How is this diagnosed? Your health care provider may be able to detect atrial fibrillation when taking your pulse. If detected, this condition may be diagnosed with:  An electrocardiogram (ECG).  A Holter monitor test that records your heartbeat patterns over a 24-hour period.  Transthoracic echocardiogram (TTE) to evaluate how blood flows through your heart.  Transesophageal echocardiogram (TEE) to view more detailed images of your heart.  A stress test.  Imaging tests, such as a CT scan or chest X-ray.  Blood tests.  How is this treated? The main goals of treatment are to prevent blood clots from forming and to keep your heart beating at a normal rate and rhythm. The type of treatment that you receive depends on many factors, such as your underlying medical conditions and how you feel when you are experiencing atrial fibrillation. This condition may be treated with:  Medicine to slow down the heart rate, bring the heart's rhythm back to normal, or prevent clots from forming.  Electrical cardioversion. This is a procedure that resets your heart's rhythm by delivering a controlled, low-energy shock to the heart through your skin.  Different types of ablation, such as catheter ablation, catheter ablation with pacemaker, or surgical ablation. These procedures destroy the heart tissues that send abnormal signals. When the pacemaker is used, it is placed under your skin to help your heart beat in   a regular rhythm.  Follow these instructions at home:  Take over-the counter and prescription medicines only as told by your  health care provider.  If your health care provider prescribed a blood-thinning medicine (anticoagulant), take it exactly as told. Taking too much blood-thinning medicine can cause bleeding. If you do not take enough blood-thinning medicine, you will not have the protection that you need against stroke and other problems.  Do not use tobacco products, including cigarettes, chewing tobacco, and e-cigarettes. If you need help quitting, ask your health care provider.  If you have obstructive sleep apnea, manage your condition as told by your health care provider.  Do not drink alcohol.  Do not drink beverages that contain caffeine, such as coffee, soda, and tea.  Maintain a healthy weight. Do not use diet pills unless your health care provider approves. Diet pills may make heart problems worse.  Follow diet instructions as told by your health care provider.  Exercise regularly as told by your health care provider.  Keep all follow-up visits as told by your health care provider. This is important. How is this prevented?  Avoid drinking beverages that contain caffeine or alcohol.  Avoid certain medicines, especially medicines that are used for breathing problems.  Avoid certain herbs and herbal medicines, such as those that contain ephedra or ginseng.  Do not use illegal drugs, such as cocaine and amphetamines.  Do not smoke.  Manage your high blood pressure. Contact a health care provider if:  You notice a change in the rate, rhythm, or strength of your heartbeat.  You are taking an anticoagulant and you notice increased bruising.  You tire more easily when you exercise or exert yourself. Get help right away if:  You have chest pain, abdominal pain, sweating, or weakness.  You feel nauseous.  You notice blood in your vomit, bowel movement, or urine.  You have shortness of breath.  You suddenly have swollen feet and ankles.  You feel dizzy.  You have sudden weakness or  numbness of the face, arm, or leg, especially on one side of the body.  You have trouble speaking, trouble understanding, or both (aphasia).  Your face or your eyelid droops on one side. These symptoms may represent a serious problem that is an emergency. Do not wait to see if the symptoms will go away. Get medical help right away. Call your local emergency services (911 in the [place] Do not drive yourself to the hospital. This information is not intended to replace advice given to you by your health care provider. Make sure you discuss any questions you have with your health care provider. Document Released: 04/07/2005 Document Revised: 08/15/2015 Document Reviewed: 08/02/2014 Elsevier Interactive Patient Education  2017 [employer] Inc.  

## 2016-12-26 ENCOUNTER — Encounter: Payer: Self-pay | Admitting: *Deleted

## 2016-12-26 ENCOUNTER — Encounter (HOSPITAL_COMMUNITY)
Admission: RE | Disposition: A | Discharge status: Home / Self Care | Payer: Self-pay | Source: Ambulatory Visit | Attending: Cardiology

## 2016-12-26 ENCOUNTER — Ambulatory Visit (HOSPITAL_COMMUNITY)
Admission: RE | Admit: 2016-12-26 | Discharge: 2016-12-26 | Disposition: A | Discharge status: Home / Self Care | Payer: Medicare Other | Source: Ambulatory Visit | Attending: Cardiology | Admitting: Cardiology

## 2016-12-26 ENCOUNTER — Telehealth: Payer: Self-pay | Admitting: *Deleted

## 2016-12-26 DIAGNOSIS — K219 Gastro-esophageal reflux disease without esophagitis: Secondary | ICD-10-CM | POA: Diagnosis not present

## 2016-12-26 DIAGNOSIS — I4891 Unspecified atrial fibrillation: Secondary | ICD-10-CM | POA: Diagnosis not present

## 2016-12-26 DIAGNOSIS — D508 Other iron deficiency anemias: Secondary | ICD-10-CM

## 2016-12-26 DIAGNOSIS — I5032 Chronic diastolic (congestive) heart failure: Secondary | ICD-10-CM | POA: Diagnosis not present

## 2016-12-26 DIAGNOSIS — J8410 Pulmonary fibrosis, unspecified: Secondary | ICD-10-CM | POA: Diagnosis not present

## 2016-12-26 DIAGNOSIS — D649 Anemia, unspecified: Secondary | ICD-10-CM | POA: Diagnosis not present

## 2016-12-26 DIAGNOSIS — R195 Other fecal abnormalities: Secondary | ICD-10-CM | POA: Diagnosis not present

## 2016-12-26 LAB — CBC WITH DIFFERENTIAL/PLATELET
Basophils Absolute: 0 10*3/uL (ref 0.0–0.2)
Basos: 0 %
EOS (ABSOLUTE): 0.1 10*3/uL (ref 0.0–0.4)
Eos: 0 %
Hematocrit: 34.3 % (ref 34.0–46.6)
Hemoglobin: 10.8 g/dL — ABNORMAL LOW (ref 11.1–15.9)
Immature Grans (Abs): 0 10*3/uL (ref 0.0–0.1)
Immature Granulocytes: 0 %
Lymphocytes Absolute: 1.8 10*3/uL (ref 0.7–3.1)
Lymphs: 14 %
MCH: 28.2 pg (ref 26.6–33.0)
MCHC: 31.5 g/dL (ref 31.5–35.7)
MCV: 90 fL (ref 79–97)
Monocytes Absolute: 0.9 10*3/uL (ref 0.1–0.9)
Monocytes: 7 %
Neutrophils Absolute: 9.5 10*3/uL — ABNORMAL HIGH (ref 1.4–7.0)
Neutrophils: 79 %
Platelets: 253 10*3/uL (ref 150–379)
RBC: 3.83 x10E6/uL (ref 3.77–5.28)
RDW: 14.5 % (ref 12.3–15.4)
WBC: 12.3 10*3/uL — ABNORMAL HIGH (ref 3.4–10.8)

## 2016-12-26 LAB — CMP14+EGFR
ALT: 23 IU/L (ref 0–32)
AST: 27 IU/L (ref 0–40)
Albumin/Globulin Ratio: 1.1 — ABNORMAL LOW (ref 1.2–2.2)
Albumin: 4.1 g/dL (ref 3.5–4.8)
Alkaline Phosphatase: 78 IU/L (ref 39–117)
BUN/Creatinine Ratio: 17 (ref 12–28)
BUN: 18 mg/dL (ref 8–27)
Bilirubin Total: 0.3 mg/dL (ref 0.0–1.2)
CO2: 29 mmol/L (ref 20–29)
Calcium: 9.5 mg/dL (ref 8.7–10.3)
Chloride: 98 mmol/L (ref 96–106)
Creatinine, Ser: 1.03 mg/dL — ABNORMAL HIGH (ref 0.57–1.00)
GFR calc Af Amer: 61 mL/min/{1.73_m2} (ref >59)
GFR calc non Af Amer: 53 mL/min/{1.73_m2} — ABNORMAL LOW (ref >59)
Globulin, Total: 3.6 g/dL (ref 1.5–4.5)
Glucose: 174 mg/dL — ABNORMAL HIGH (ref 65–99)
Potassium: 4.6 mmol/L (ref 3.5–5.2)
Sodium: 143 mmol/L (ref 134–144)
Total Protein: 7.7 g/dL (ref 6.0–8.5)

## 2016-12-26 SURGERY — CARDIOVERSION
Anesthesia: Monitor Anesthesia Care

## 2016-12-26 MED ORDER — METOPROLOL SUCCINATE ER 100 MG PO TB24: ORAL_TABLET | ORAL | 6 refills | Status: DC

## 2016-12-26 NOTE — H&P (Signed)
Please see Dr Cherlyn Cushing clinic note referenced below for full medical history. She presents today for electrical cardioversion for atrial fibrillation. Reports recent dark stools, FOBT was positive. Hgb down 12.4 to 10.8. Concern for GI bleed, will refer to GI. Cardioversion with the potential early termination of anticoagulation creates significant higher risk of stroke. We will cancel cardioversion today, hold eliquis due to nearly 2 point drop in Hgb, increase Toprol XL to 150mg  daily, arrange f/u with Dr Amedeo Plenty her GI doctor.   Carlyle Dolly MD

## 2016-12-26 NOTE — Telephone Encounter (Signed)
-----   Message from Arnoldo Lenis, MD sent at 12/26/2016  9:45 AM EDT ----- Patients cardioversion cancelled for today. Kisha please have her hold her eliquis, increase Toprol XL to 150mg  daily. Please contact her GI doctor Teena [place] in [place] and arrange an appt ASAP. Repeat CBC next Friday, f/u Dr Percival Spanish 2-3 weeks  Zandra Abts MD

## 2016-12-26 NOTE — Telephone Encounter (Signed)
Appt made with Dr. Amedeo Plenty for 12/26/16 @ 1:00 pm. Order placed for CBC. Dr. Percival Spanish does not have appt till Oct. So pt. Will see Kerin Ransom, PA-C 01/13/17 @ 10 at [place] office. Called to inform pt no answer.

## 2016-12-27 DIAGNOSIS — J849 Interstitial pulmonary disease, unspecified: Secondary | ICD-10-CM | POA: Diagnosis not present

## 2016-12-27 DIAGNOSIS — I272 Pulmonary hypertension, unspecified: Secondary | ICD-10-CM | POA: Diagnosis not present

## 2016-12-27 DIAGNOSIS — J8410 Pulmonary fibrosis, unspecified: Secondary | ICD-10-CM | POA: Diagnosis not present

## 2016-12-27 DIAGNOSIS — I130 Hypertensive heart and chronic kidney disease with heart failure and stage 1 through stage 4 chronic kidney disease, or unspecified chronic kidney disease: Secondary | ICD-10-CM | POA: Diagnosis not present

## 2016-12-27 DIAGNOSIS — I5031 Acute diastolic (congestive) heart failure: Secondary | ICD-10-CM | POA: Diagnosis not present

## 2016-12-27 DIAGNOSIS — N182 Chronic kidney disease, stage 2 (mild): Secondary | ICD-10-CM | POA: Diagnosis not present

## 2016-12-28 LAB — FECAL OCCULT BLOOD, IMMUNOCHEMICAL: Fecal Occult Bld: NEGATIVE

## 2016-12-29 ENCOUNTER — Telehealth: Payer: Self-pay | Admitting: Family

## 2016-12-29 ENCOUNTER — Telehealth: Payer: Self-pay | Admitting: Cardiology

## 2016-12-29 NOTE — Telephone Encounter (Signed)
Needs to follow up with GI, see result note

## 2016-12-29 NOTE — Telephone Encounter (Signed)
Pt states she called GI in the meantime - and they have called in iron

## 2016-12-29 NOTE — Telephone Encounter (Signed)
I spoke with pt, she will fu on 9/25 as scheduled,she spoke with nurse in GI last week

## 2016-12-29 NOTE — Telephone Encounter (Signed)
Patient was scheduled for DCCV on Friday per Dr.Hochrein. Dr.Branch cancelled DCCV and patient states he made medication changes. Patient is questioning how long she needs to continue these changes. Please return call. / tg

## 2016-12-29 NOTE — Telephone Encounter (Signed)
Continue for now, she will be reassessed at her follow up to see what other changes may need to be made. We increased her Toprol to try and better control her elevated heart rates. We stopped her blood thinner because we are worried about a possible GI bleed. Has she heard from her GI doctor yet?   Carlyle Dolly MD

## 2016-12-29 NOTE — Telephone Encounter (Signed)
I will forward to Dr Harl Bowie to clarify

## 2016-12-30 DIAGNOSIS — J849 Interstitial pulmonary disease, unspecified: Secondary | ICD-10-CM | POA: Diagnosis not present

## 2016-12-30 DIAGNOSIS — N182 Chronic kidney disease, stage 2 (mild): Secondary | ICD-10-CM | POA: Diagnosis not present

## 2016-12-30 DIAGNOSIS — I5031 Acute diastolic (congestive) heart failure: Secondary | ICD-10-CM | POA: Diagnosis not present

## 2016-12-30 DIAGNOSIS — I272 Pulmonary hypertension, unspecified: Secondary | ICD-10-CM | POA: Diagnosis not present

## 2016-12-30 DIAGNOSIS — I130 Hypertensive heart and chronic kidney disease with heart failure and stage 1 through stage 4 chronic kidney disease, or unspecified chronic kidney disease: Secondary | ICD-10-CM | POA: Diagnosis not present

## 2016-12-30 DIAGNOSIS — J8410 Pulmonary fibrosis, unspecified: Secondary | ICD-10-CM | POA: Diagnosis not present

## 2017-01-01 ENCOUNTER — Other Ambulatory Visit: Payer: Medicare Other

## 2017-01-01 ENCOUNTER — Other Ambulatory Visit: Payer: Self-pay | Admitting: *Deleted

## 2017-01-01 DIAGNOSIS — D508 Other iron deficiency anemias: Secondary | ICD-10-CM

## 2017-01-02 ENCOUNTER — Other Ambulatory Visit: Payer: Self-pay | Admitting: Family

## 2017-01-02 LAB — CBC WITH DIFFERENTIAL/PLATELET
Basophils Absolute: 0 10*3/uL (ref 0.0–0.2)
Basos: 0 %
EOS (ABSOLUTE): 0.2 10*3/uL (ref 0.0–0.4)
Eos: 1 %
Hematocrit: 38 % (ref 34.0–46.6)
Hemoglobin: 11.7 g/dL (ref 11.1–15.9)
Immature Grans (Abs): 0 10*3/uL (ref 0.0–0.1)
Immature Granulocytes: 0 %
Lymphocytes Absolute: 2.5 10*3/uL (ref 0.7–3.1)
Lymphs: 20 %
MCH: 28.6 pg (ref 26.6–33.0)
MCHC: 30.8 g/dL — ABNORMAL LOW (ref 31.5–35.7)
MCV: 93 fL (ref 79–97)
Monocytes Absolute: 1 10*3/uL — ABNORMAL HIGH (ref 0.1–0.9)
Monocytes: 8 %
Neutrophils Absolute: 8.8 10*3/uL — ABNORMAL HIGH (ref 1.4–7.0)
Neutrophils: 71 %
Platelets: 246 10*3/uL (ref 150–379)
RBC: 4.09 x10E6/uL (ref 3.77–5.28)
RDW: 14.7 % (ref 12.3–15.4)
WBC: 12.6 10*3/uL — ABNORMAL HIGH (ref 3.4–10.8)

## 2017-01-04 ENCOUNTER — Other Ambulatory Visit: Payer: Self-pay | Admitting: Family

## 2017-01-04 DIAGNOSIS — J019 Acute sinusitis, unspecified: Secondary | ICD-10-CM

## 2017-01-07 ENCOUNTER — Other Ambulatory Visit: Payer: Self-pay | Admitting: Family

## 2017-01-07 DIAGNOSIS — D500 Iron deficiency anemia secondary to blood loss (chronic): Secondary | ICD-10-CM | POA: Diagnosis not present

## 2017-01-08 ENCOUNTER — Telehealth: Payer: Self-pay | Admitting: Cardiology

## 2017-01-08 ENCOUNTER — Ambulatory Visit: Payer: Medicare Other | Admitting: Family

## 2017-01-08 NOTE — Telephone Encounter (Signed)
I will forward to Dr Branch 

## 2017-01-08 NOTE — Telephone Encounter (Signed)
Patient states that GI doctor told her to start Eliquis back. Wants to know if that's ok w/ Dr.Branch. / tg

## 2017-01-09 ENCOUNTER — Other Ambulatory Visit: Payer: Medicare Other

## 2017-01-09 DIAGNOSIS — J8410 Pulmonary fibrosis, unspecified: Secondary | ICD-10-CM | POA: Diagnosis not present

## 2017-01-09 DIAGNOSIS — J849 Interstitial pulmonary disease, unspecified: Secondary | ICD-10-CM | POA: Diagnosis not present

## 2017-01-09 DIAGNOSIS — I130 Hypertensive heart and chronic kidney disease with heart failure and stage 1 through stage 4 chronic kidney disease, or unspecified chronic kidney disease: Secondary | ICD-10-CM | POA: Diagnosis not present

## 2017-01-09 DIAGNOSIS — N182 Chronic kidney disease, stage 2 (mild): Secondary | ICD-10-CM | POA: Diagnosis not present

## 2017-01-09 DIAGNOSIS — I5031 Acute diastolic (congestive) heart failure: Secondary | ICD-10-CM | POA: Diagnosis not present

## 2017-01-09 DIAGNOSIS — I272 Pulmonary hypertension, unspecified: Secondary | ICD-10-CM | POA: Diagnosis not present

## 2017-01-09 NOTE — Telephone Encounter (Signed)
Notified pt it is ok to restart Eliquis   Dr Therisa Doyne with [facility], to fax records

## 2017-01-09 NOTE — Telephone Encounter (Signed)
If GI is ok then we are ok with restarting. We will need to have her GI records available at time of her appointment with [facility] next week.    Zandra Abts MD

## 2017-01-12 DIAGNOSIS — I5031 Acute diastolic (congestive) heart failure: Secondary | ICD-10-CM | POA: Diagnosis not present

## 2017-01-12 DIAGNOSIS — N182 Chronic kidney disease, stage 2 (mild): Secondary | ICD-10-CM | POA: Diagnosis not present

## 2017-01-12 DIAGNOSIS — J8410 Pulmonary fibrosis, unspecified: Secondary | ICD-10-CM | POA: Diagnosis not present

## 2017-01-12 DIAGNOSIS — I272 Pulmonary hypertension, unspecified: Secondary | ICD-10-CM | POA: Diagnosis not present

## 2017-01-12 DIAGNOSIS — I130 Hypertensive heart and chronic kidney disease with heart failure and stage 1 through stage 4 chronic kidney disease, or unspecified chronic kidney disease: Secondary | ICD-10-CM | POA: Diagnosis not present

## 2017-01-12 DIAGNOSIS — J849 Interstitial pulmonary disease, unspecified: Secondary | ICD-10-CM | POA: Diagnosis not present

## 2017-01-12 NOTE — Progress Notes (Addendum)
Cardiology Office Note   Date:  01/13/2017   ID:  WILEY FLICKER, DOB 1941-02-26, MRN [id number]  PCP:  Sharion Balloon, FNP  Cardiologist:  [facility]  Chief Complaint  Patient presents with  . Atrial Fibrillation     History of Present Illness: Lorraine Leonard is a 76 y.o. female who presents for ongoing assessment and management of atrial fibrillation, history of pulmonary fibrosis and interstitial lung disease. The patient was last seen by Dr. Percival Spanish on 12/17/2016, for newly diagnosed atrial fibrillation.CHADS VASC Score of 6. The patient was placed on metoprolol 100 mg daily, plan for outpatient cardioversion, and was continued on ELIQUIS. There is no evidence of volume overload at that time.   She was scheduled for cardioversion on 12/26/2016. However, the patient was found to have positive fecal occult blood, with anemia (hemoglobin from 12.4-10.8 on labs. There was a concern for GI bleed, and she was referred to GI for further evaluation. ELIQUIS was held, and metoprolol was increased to 150 mg daily.  She comes today having been restarted on Eliquis by GI. We are still awaiting records from them to confirm. She does not remember whether she is scheduled to see GI again or not. She does not report bleeding or hemoptysis since starting back on Eliquis. Her main complaint is racing HR while at rest. She states it goes up to 120 bpm sometimes. She does not know what doses of medications she takes. Granddaughter fills her medication tray.   Past Medical History:  Diagnosis Date  . Acute diastolic CHF (congestive heart failure) ([place]) 11/11/2016  . Allergic rhinitis   . Anxiety   . Anxiety   . Atrial fibrillation ([place]) 09/2016  . Depression   . Diabetes mellitus    pre-diabetic  . Diverticulosis   . Dyspnea   . Dysrhythmia   . GERD (gastroesophageal reflux disease)   . Hyperlipidemia   . Hypertension   . Hypothyroidism   . Lung fibrosis ([place])   . Macular degeneration   . Pulmonary  fibrosis ([place])   . Rheumatoid arthritis(714.0)   . Urticaria     Past Surgical History:  Procedure Laterality Date  . EUS N/A 10/03/2015   Procedure: UPPER ENDOSCOPIC ULTRASOUND (EUS) RADIAL;  Surgeon: Arta Silence, MD;  Location: [facility];  Service: Endoscopy;  Laterality: N/A;  . FINE NEEDLE ASPIRATION N/A 10/03/2015   Procedure: FINE NEEDLE ASPIRATION (FNA) RADIAL;  Surgeon: Arta Silence, MD;  Location: [facility];  Service: Endoscopy;  Laterality: N/A;  . 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 LUMBAR DISC SURGERY     x 2  . TUBAL LIGATION    . VAGINAL HYSTERECTOMY       Current Outpatient Prescriptions  Medication Sig Dispense Refill  . albuterol (PROVENTIL HFA;VENTOLIN HFA) 108 (90 Base) MCG/ACT inhaler Inhale 2 puffs into the lungs every 6 (six) hours as needed for wheezing or shortness of breath. 1 Inhaler 2  . apixaban (ELIQUIS) 5 MG TABS tablet Take 1 tablet (5 mg total) by mouth 2 (two) times daily. 60 tablet 1  . azelastine (ASTELIN) 0.1 % nasal spray PLACE 2 SPRAYS INTO BOTH NOSTRILS 2 (TWO) TIMES DAILY AS NEEDED. (Patient taking differently: PLACE 2 SPRAYS INTO BOTH NOSTRILS AT BEDTIME) 30 mL 4  . calcium citrate (CALCITRATE - DOSED IN MG ELEMENTAL CALCIUM) 950 MG tablet Take 1 tablet by mouth 2 (two) times daily.     . Cholecalciferol 2000 units CAPS Take  2,000 Units by mouth daily.     Marland Kitchen conjugated estrogens (PREMARIN) vaginal cream Place 0.5 g vaginally every Thursday. Use as directed    . cyanocobalamin 500 MCG tablet Take 500 mcg by mouth daily.     . diazepam (VALIUM) 5 MG tablet TAKE 1 TABLET TWICE A DAY AS NEEDED FOR ANXIETY (Patient taking differently: Take 5 mg by mouth 2 (two) times daily as needed for anxiety. ) 60 tablet 5  . diltiazem (CARDIZEM CD) 180 MG 24 hr capsule TAKE 1 CAPSULE BY MOUTH EVERY DAY 30 capsule 5  . docusate sodium (COLACE) 100 MG capsule Take 200 mg by mouth daily as needed for mild constipation.    Marland Kitchen EPINEPHrine 0.3  mg/0.3 mL IJ SOAJ injection Inject 0.3 mg into the muscle once as needed (For anaphylaxis.).    Marland Kitchen esomeprazole (NEXIUM) 40 MG capsule Take 40 mg by mouth daily.     . fluticasone (FLONASE) 50 MCG/ACT nasal spray PLACE 1 SPRAY INTO BOTH NOSTRILS 2 (TWO) TIMES DAILY AS NEEDED FOR ALLERGIES OR RHINITIS. 16 g 2  . furosemide (LASIX) 40 MG tablet TAKE 1 TABLET BY MOUTH EVERY DAY 30 tablet 5  . INCRUSE ELLIPTA 62.5 MCG/INH AEPB TAKE 1 PUFF BY MOUTH EVERY DAY  3  . levocetirizine (XYZAL) 5 MG tablet Take 5 mg by mouth every evening.     Marland Kitchen levothyroxine (SYNTHROID, LEVOTHROID) 100 MCG tablet TAKE 1 TABLET (100 MCG TOTAL) BY MOUTH DAILY BEFORE BREAKFAST. 90 tablet 1  . metoprolol succinate (TOPROL-XL) 100 MG 24 hr tablet Take 1 and 1/2 Tablet Daily. Take with or immediately following a meal. 45 tablet 6  . mometasone (ELOCON) 0.1 % cream APPLY TO AFFECTED AREA DAILY 45 g 2  . Multiple Vitamins-Minerals (PRESERVISION AREDS 2 PO) Take 1 tablet by mouth 2 (two) times daily.    . ondansetron (ZOFRAN) 8 MG tablet Take 8 mg by mouth every 8 (eight) hours as needed for nausea or vomiting.    Marland Kitchen OVER THE COUNTER MEDICATION Place 1 drop into both eyes as needed (For dry eyes.). Systane Gel Drops    . PATADAY 0.2 % SOLN Place 1 drop into both eyes daily.    . polyethylene glycol powder (GLYCOLAX/MIRALAX) powder TAKE 17 G BY MOUTH 2 (TWO) TIMES DAILY AS NEEDED. 3162 g 0  . predniSONE (DELTASONE) 10 MG tablet Take 10 mg by mouth daily with breakfast.    . rosuvastatin (CRESTOR) 10 MG tablet TAKE 1 TABLET BY MOUTH AT BEDTIME 30 tablet 0  . traMADol (ULTRAM) 50 MG tablet TAKE 1 TABLET BY MOUTH EVERY 6 HOURS AS NEEDED (Patient taking differently: TAKE 1 TABLET BY MOUTH EVERY 6 HOURS AS NEEDED FOR PAIN) 90 tablet 2  . trimethoprim (TRIMPEX) 100 MG tablet Take 100 mg by mouth daily.    Marland Kitchen VIGAMOX 0.5 % ophthalmic solution Apply 1 drop to eye as needed (after eye injection).   12  . vitamin C (ASCORBIC ACID) 500 MG tablet  Take 500 mg by mouth daily.    . Vitamin D, Ergocalciferol, (DRISDOL) 50000 units CAPS capsule TAKE 1 CAPSULE (50,000 UNITS TOTAL) BY MOUTH EVERY 7 (SEVEN) DAYS. (Patient taking differently: Take 50,000 Units by mouth every 7 (seven) days. Take on Wednesday) 12 capsule 0  . vitamin E 400 UNIT capsule Take 400 Units by mouth daily.     No current facility-administered medications for this visit.     Allergies:   Allopurinol and Mobic [meloxicam]    Social  History:  The patient  reports that she has never smoked. She has never used smokeless tobacco. She reports that she does not drink alcohol or use drugs.   Family History:  The patient's family history includes Anxiety disorder in her unknown relative; Asthma in her unknown relative; Coronary artery disease in her unknown relative; Diabetes in her unknown relative; Hyperlipidemia in her unknown relative; Hypertension in her unknown relative; Kidney disease in her sister; Lung cancer in her sister and sister; Migraines in her unknown relative; Stroke in her unknown relative; Suicidality in her son.    ROS: All other systems are reviewed and negative. Unless otherwise mentioned in H&P    PHYSICAL EXAM: VS:  BP 126/72   Pulse (!) 118   Ht 5\' 8"  (1.727 m)   Wt 246 lb (111.6 kg)   SpO2 98%   BMI 37.40 kg/m  , BMI Body mass index is 37.4 kg/m. GEN: Well nourished, well developed, in no acute distress  HEENT: normal  Neck: no JVD, carotid bruits, or masses Cardiac: IRRR; tachycardic, no murmurs, rubs, or gallops,no edema  Respiratory:  Bilateral crackles in the bases. Wearing O2.  GI: soft, nontender, nondistended, + BS MS: no deformity or atrophy  Skin: warm and dry, no rash Neuro:  Strength and sensation are intact Psych: euthymic mood, full affect   Recent Labs: 09/25/2016: Magnesium 2.0 11/10/2016: B Natriuretic Peptide 607.0; TSH 2.402 12/25/2016: ALT 23; BUN 18; Creatinine, Ser 1.03; Potassium 4.6; Sodium 143 01/01/2017:  Hemoglobin 11.7; Platelets 246    Lipid Panel    Component Value Date/Time   CHOL 135 07/17/2016 1501   TRIG 148 07/17/2016 1501   TRIG 169 (H) 08/17/2014 1136   HDL 36 (L) 07/17/2016 1501   HDL 44 08/17/2014 1136   CHOLHDL 3.8 07/17/2016 1501   LDLCALC 69 07/17/2016 1501      Wt Readings from Last 3 Encounters:  01/13/17 246 lb (111.6 kg)  12/25/16 254 lb 9.6 oz (115.5 kg)  12/17/16 249 lb (112.9 kg)    Other studies Reviewed: Echocardiogram 10/08/2016 Left ventricle: The cavity size was normal. Wall thickness was   increased in a pattern of mild LVH. Systolic function was normal.   The estimated ejection fraction was in the range of 60% to 65%.   Wall motion was normal; there were no regional wall motion   abnormalities. Features are consistent with a pseudonormal left   ventricular filling pattern, with concomitant abnormal relaxation   and increased filling pressure (grade 2 diastolic dysfunction). - Aortic valve: Mildly calcified annulus. Trileaflet. - Mitral valve: Calcified annulus. - Right atrium: Central venous pressure (est): 3 mm Hg. - Atrial septum: No defect or patent foramen ovale was identified. - Tricuspid valve: There was trivial regurgitation. - Pulmonary arteries: PA peak pressure: 25 mm Hg (S). - Pericardium, extracardiac: A prominent pericardial fat pad was   present.   ASSESSMENT AND PLAN:  1. Atrial fib: Patients heart rate is not well controlled. She is not sure what dose of metoprolol she is taking. She should be on 150 mg daily. She continues to have episodes of racing HR. Will increase to 200 mg daily. She will continue Eliquis. We have requested records again from [place] for confirmation of ok to restart Eliquis and ongoing testing. Once we have gotten full information concerning GI plan, will make further arrangements for DCCV again. She will need to be on uninterrupted anticoagulation for 3 weeks prior to having DCCV  2. Pulmonary fibrosis:  She is oxygen dependent currently. She should follow with pulmonary,   3. FOBT Positive: Followed by GI. She is to continue labs. We are awaiting notes from GI in order to make further plans for DCCV.     Current medicines are reviewed at length with the patient today.    ADDENDUM: 01/19/2017  Reviewed records from GI, Dr. Ronnette Juniper, who saw patient on 01/07/2017 His noted states that she is not on Eliquis, that she stopped taking it one month ago. They did recommend that she start back on Eliquis that day as there was no evidence of bleeding. She will need to be on Eliquis for 3 weeks prior to scheduling DCCV. That will take her into the second week of October. Will have DCCV planned for then. She has follow up appointment with Dr. Harl Bowie on Feb 18, 2017. She will keep that appointment.    Phill Myron. West Pugh, ANP, AACC   01/13/2017 4:06 PM    [facility] [address], [place], [place] [address] Phone: [phone]; Fax: [phone]

## 2017-01-13 ENCOUNTER — Encounter: Payer: Self-pay | Admitting: Adult Health

## 2017-01-13 ENCOUNTER — Ambulatory Visit: Payer: Medicare Other | Admitting: Cardiology

## 2017-01-13 ENCOUNTER — Other Ambulatory Visit: Payer: Self-pay | Admitting: Family

## 2017-01-13 ENCOUNTER — Ambulatory Visit (INDEPENDENT_AMBULATORY_CARE_PROVIDER_SITE_OTHER): Payer: Medicare Other | Admitting: Adult Health

## 2017-01-13 ENCOUNTER — Other Ambulatory Visit: Payer: Medicare Other

## 2017-01-13 VITALS — BP 126/72 | HR 118 | Ht 68.0 in | Wt 246.0 lb

## 2017-01-13 DIAGNOSIS — J8410 Pulmonary fibrosis, unspecified: Secondary | ICD-10-CM

## 2017-01-13 DIAGNOSIS — I481 Persistent atrial fibrillation: Secondary | ICD-10-CM | POA: Diagnosis not present

## 2017-01-13 DIAGNOSIS — Z8719 Personal history of other diseases of the digestive system: Secondary | ICD-10-CM

## 2017-01-13 DIAGNOSIS — I4819 Other persistent atrial fibrillation: Secondary | ICD-10-CM

## 2017-01-13 NOTE — Patient Instructions (Signed)
Medication Instructions:  Your physician recommends that you continue on your current medications as directed. Please refer to the Current Medication list given to you today.   Labwork: NONE   Testing/Procedures: NONE   Follow-Up: Your physician recommends that you schedule a follow-up appointment in: 1 Month    Any Other Special Instructions Will Be Listed Below (If Applicable).     If you need a refill on your cardiac medications before your next appointment, please call your pharmacy.  Thank you for choosing [facility]!   

## 2017-01-16 ENCOUNTER — Other Ambulatory Visit: Payer: Self-pay | Admitting: Family

## 2017-01-19 ENCOUNTER — Other Ambulatory Visit: Payer: Self-pay | Admitting: Family

## 2017-01-21 ENCOUNTER — Encounter: Payer: Self-pay | Admitting: *Deleted

## 2017-01-21 ENCOUNTER — Encounter (INDEPENDENT_AMBULATORY_CARE_PROVIDER_SITE_OTHER): Payer: Medicare Other | Admitting: Ophthalmology

## 2017-01-21 ENCOUNTER — Telehealth: Payer: Self-pay | Admitting: *Deleted

## 2017-01-21 ENCOUNTER — Other Ambulatory Visit: Payer: Self-pay | Admitting: *Deleted

## 2017-01-21 DIAGNOSIS — H35033 Hypertensive retinopathy, bilateral: Secondary | ICD-10-CM

## 2017-01-21 DIAGNOSIS — H353231 Exudative age-related macular degeneration, bilateral, with active choroidal neovascularization: Secondary | ICD-10-CM

## 2017-01-21 DIAGNOSIS — H43813 Vitreous degeneration, bilateral: Secondary | ICD-10-CM | POA: Diagnosis not present

## 2017-01-21 DIAGNOSIS — E113291 Type 2 diabetes mellitus with mild nonproliferative diabetic retinopathy without macular edema, right eye: Secondary | ICD-10-CM

## 2017-01-21 DIAGNOSIS — N182 Chronic kidney disease, stage 2 (mild): Secondary | ICD-10-CM | POA: Diagnosis not present

## 2017-01-21 DIAGNOSIS — I10 Essential (primary) hypertension: Secondary | ICD-10-CM | POA: Diagnosis not present

## 2017-01-21 DIAGNOSIS — E11319 Type 2 diabetes mellitus with unspecified diabetic retinopathy without macular edema: Secondary | ICD-10-CM

## 2017-01-21 DIAGNOSIS — E113392 Type 2 diabetes mellitus with moderate nonproliferative diabetic retinopathy without macular edema, left eye: Secondary | ICD-10-CM

## 2017-01-21 DIAGNOSIS — I272 Pulmonary hypertension, unspecified: Secondary | ICD-10-CM | POA: Diagnosis not present

## 2017-01-21 DIAGNOSIS — J8410 Pulmonary fibrosis, unspecified: Secondary | ICD-10-CM | POA: Diagnosis not present

## 2017-01-21 DIAGNOSIS — D500 Iron deficiency anemia secondary to blood loss (chronic): Secondary | ICD-10-CM | POA: Diagnosis not present

## 2017-01-21 DIAGNOSIS — I5031 Acute diastolic (congestive) heart failure: Secondary | ICD-10-CM | POA: Diagnosis not present

## 2017-01-21 DIAGNOSIS — I480 Paroxysmal atrial fibrillation: Secondary | ICD-10-CM

## 2017-01-21 DIAGNOSIS — I130 Hypertensive heart and chronic kidney disease with heart failure and stage 1 through stage 4 chronic kidney disease, or unspecified chronic kidney disease: Secondary | ICD-10-CM | POA: Diagnosis not present

## 2017-01-21 DIAGNOSIS — J849 Interstitial pulmonary disease, unspecified: Secondary | ICD-10-CM | POA: Diagnosis not present

## 2017-01-21 LAB — HM DIABETES EYE EXAM

## 2017-01-21 NOTE — Patient Instructions (Signed)
Lorraine Leonard  01/21/2017     @PREFPERIOPPHARMACY @   Your procedure is scheduled on  01/30/2017  Report to [facility] at  37  A.M.  Call this number if you have problems the morning of surgery:  [phone]   Remember:  Do not eat food or drink liquids after midnight.  Take these medicines the morning of surgery with A SIP OF WATER  Valium, cardiazem, nexium, levothyroxine, ultram. Use your inhaler before you come.   Do not wear jewelry, make-up or nail polish.  Do not wear lotions, powders, or perfumes, or deoderant.  Do not shave 48 hours prior to surgery.  Men may shave face and neck.  Do not bring valuables to the hospital.  [facility] is not responsible for any belongings or valuables.  Contacts, dentures or bridgework may not be worn into surgery.  Leave your suitcase in the car.  After surgery it may be brought to your room.  For patients admitted to the hospital, discharge time will be determined by your treatment team.  Patients discharged the day of surgery will not be allowed to drive home.   Name and phone number of your driver:   family Special instructions:  DO NOT miss any doses of your Eliquis.  Please read over the following fact sheets that you were given. Anesthesia Post-op Instructions and Care and Recovery After Surgery       Electrical Cardioversion Electrical cardioversion is the delivery of a jolt of electricity to restore a normal rhythm to the heart. A rhythm that is too fast or is not regular keeps the heart from pumping well. In this procedure, sticky patches or metal paddles are placed on the chest to deliver electricity to the heart from a device. This procedure may be done in an emergency if:  There is low or no blood pressure as a result of the heart rhythm.  Normal rhythm must be restored as fast as possible to protect the brain and heart from further damage.  It may save a life.  This procedure may also be done for  irregular or fast heart rhythms that are not immediately life-threatening. Tell a health care provider about:  Any allergies you have.  All medicines you are taking, including vitamins, herbs, eye drops, creams, and over-the-counter medicines.  Any problems you or family members have had with anesthetic medicines.  Any blood disorders you have.  Any surgeries you have had.  Any medical conditions you have.  Whether you are pregnant or may be pregnant. What are the risks? Generally, this is a safe procedure. However, problems may occur, including:  Allergic reactions to medicines.  A blood clot that breaks free and travels to other parts of your body.  The possible return of an abnormal heart rhythm within hours or days after the procedure.  Your heart stopping (cardiac arrest). This is rare.  What happens before the procedure? Medicines  Your health care provider may have you start taking: ? Blood-thinning medicines (anticoagulants) so your blood does not clot as easily. ? Medicines may be given to help stabilize your heart rate and rhythm.  Ask your health care provider about changing or stopping your regular medicines. This is especially important if you are taking diabetes medicines or blood thinners. General instructions  Plan to have someone take you home from the hospital or clinic.  If you will be going home right after the procedure, plan  to have someone with you for 24 hours.  Follow instructions from your health care provider about eating or drinking restrictions. What happens during the procedure?  To lower your risk of infection: ? Your health care team will wash or sanitize their hands. ? Your skin will be washed with soap.  An IV tube will be inserted into one of your veins.  You will be given a medicine to help you relax (sedative).  Sticky patches (electrodes) or metal paddles may be placed on your chest.  An electrical shock will be  delivered. The procedure may vary among health care providers and hospitals. What happens after the procedure?  Your blood pressure, heart rate, breathing rate, and blood oxygen level will be monitored until the medicines you were given have worn off.  Do not drive for 24 hours if you were given a sedative.  Your heart rhythm will be watched to make sure it does not change. This information is not intended to replace advice given to you by your health care provider. Make sure you discuss any questions you have with your health care provider. Document Released: 03/28/2002 Document Revised: 12/05/2015 Document Reviewed: 10/12/2015 Elsevier Interactive Patient Education  2017 [employer].  Electrical Cardioversion, Care After This sheet gives you information about how to care for yourself after your procedure. Your health care provider may also give you more specific instructions. If you have problems or questions, contact your health care provider. What can I expect after the procedure? After the procedure, it is common to have:  Some redness on the skin where the shocks were given.  Follow these instructions at home:  Do not drive for 24 hours if you were given a medicine to help you relax (sedative).  Take over-the-counter and prescription medicines only as told by your health care provider.  Ask your health care provider how to check your pulse. Check it often.  Rest for 48 hours after the procedure or as told by your health care provider.  Avoid or limit your caffeine use as told by your health care provider. Contact a health care provider if:  You feel like your heart is beating too quickly or your pulse is not regular.  You have a serious muscle cramp that does not go away. Get help right away if:  You have discomfort in your chest.  You are dizzy or you feel faint.  You have trouble breathing or you are short of breath.  Your speech is slurred.  You have trouble  moving an arm or leg on one side of your body.  Your fingers or toes turn cold or blue. This information is not intended to replace advice given to you by your health care provider. Make sure you discuss any questions you have with your health care provider. Document Released: 01/26/2013 Document Revised: 11/09/2015 Document Reviewed: 10/12/2015 Elsevier Interactive Patient Education  2018 [place] Anesthesia is a term that refers to techniques, procedures, and medicines that help a person stay safe and comfortable during a medical procedure. Monitored anesthesia care, or sedation, is one type of anesthesia. Your anesthesia specialist may recommend sedation if you will be having a procedure that does not require you to be unconscious, such as:  Cataract surgery.  A dental procedure.  A biopsy.  A colonoscopy.  During the procedure, you may receive a medicine to help you relax (sedative). There are three levels of sedation:  Mild sedation. At this level, you may  feel awake and relaxed. You will be able to follow directions.  Moderate sedation. At this level, you will be sleepy. You may not remember the procedure.  Deep sedation. At this level, you will be asleep. You will not remember the procedure.  The more medicine you are given, the deeper your level of sedation will be. Depending on how you respond to the procedure, the anesthesia specialist may change your level of sedation or the type of anesthesia to fit your needs. An anesthesia specialist will monitor you closely during the procedure. Let your health care provider know about:  Any allergies you have.  All medicines you are taking, including vitamins, herbs, eye drops, creams, and over-the-counter medicines.  Any use of steroids (by mouth or as a cream).  Any problems you or family members have had with sedatives and anesthetic medicines.  Any blood disorders you have.  Any surgeries you  have had.  Any medical conditions you have, such as sleep apnea.  Whether you are pregnant or may be pregnant.  Any use of cigarettes, alcohol, or street drugs. What are the risks? Generally, this is a safe procedure. However, problems may occur, including:  Getting too much medicine (oversedation).  Nausea.  Allergic reaction to medicines.  Trouble breathing. If this happens, a breathing tube may be used to help with breathing. It will be removed when you are awake and breathing on your own.  Heart trouble.  Lung trouble.  Before the procedure Staying hydrated Follow instructions from your health care provider about hydration, which may include:  Up to 2 hours before the procedure - you may continue to drink clear liquids, such as water, clear fruit juice, black coffee, and plain tea.  Eating and drinking restrictions Follow instructions from your health care provider about eating and drinking, which may include:  8 hours before the procedure - stop eating heavy meals or foods such as meat, fried foods, or fatty foods.  6 hours before the procedure - stop eating light meals or foods, such as toast or cereal.  6 hours before the procedure - stop drinking milk or drinks that contain milk.  2 hours before the procedure - stop drinking clear liquids.  Medicines Ask your health care provider about:  Changing or stopping your regular medicines. This is especially important if you are taking diabetes medicines or blood thinners.  Taking medicines such as aspirin and ibuprofen. These medicines can thin your blood. Do not take these medicines before your procedure if your health care provider instructs you not to.  Tests and exams  You will have a physical exam.  You may have blood tests done to show: ? How well your kidneys and liver are working. ? How well your blood can clot.  General instructions  Plan to have someone take you home from the hospital or  clinic.  If you will be going home right after the procedure, plan to have someone with you for 24 hours.  What happens during the procedure?  Your blood pressure, heart rate, breathing, level of pain and overall condition will be monitored.  An IV tube will be inserted into one of your veins.  Your anesthesia specialist will give you medicines as needed to keep you comfortable during the procedure. This may mean changing the level of sedation.  The procedure will be performed. After the procedure  Your blood pressure, heart rate, breathing rate, and blood oxygen level will be monitored until the medicines you were given  have worn off.  Do not drive for 24 hours if you received a sedative.  You may: ? Feel sleepy, clumsy, or nauseous. ? Feel forgetful about what happened after the procedure. ? Have a sore throat if you had a breathing tube during the procedure. ? Vomit. This information is not intended to replace advice given to you by your health care provider. Make sure you discuss any questions you have with your health care provider. Document Released: 01/01/2005 Document Revised: 09/14/2015 Document Reviewed: 07/29/2015 Elsevier Interactive Patient Education  2018 [place], Care After These instructions provide you with information about caring for yourself after your procedure. Your health care provider may also give you more specific instructions. Your treatment has been planned according to current medical practices, but problems sometimes occur. Call your health care provider if you have any problems or questions after your procedure. What can I expect after the procedure? After your procedure, it is common to:  Feel sleepy for several hours.  Feel clumsy and have poor balance for several hours.  Feel forgetful about what happened after the procedure.  Have poor judgment for several hours.  Feel nauseous or vomit.  Have a sore throat  if you had a breathing tube during the procedure.  Follow these instructions at home: For at least 24 hours after the procedure:   Do not: ? Participate in activities in which you could fall or become injured. ? Drive. ? Use heavy machinery. ? Drink alcohol. ? Take sleeping pills or medicines that cause drowsiness. ? Make important decisions or sign legal documents. ? Take care of children on your own.  Rest. Eating and drinking  Follow the diet that is recommended by your health care provider.  If you vomit, drink water, juice, or soup when you can drink without vomiting.  Make sure you have little or no nausea before eating solid foods. General instructions  Have a responsible adult stay with you until you are awake and alert.  Take over-the-counter and prescription medicines only as told by your health care provider.  If you smoke, do not smoke without supervision.  Keep all follow-up visits as told by your health care provider. This is important. Contact a health care provider if:  You keep feeling nauseous or you keep vomiting.  You feel light-headed.  You develop a rash.  You have a fever. Get help right away if:  You have trouble breathing. This information is not intended to replace advice given to you by your health care provider. Make sure you discuss any questions you have with your health care provider. Document Released: 07/29/2015 Document Revised: 11/28/2015 Document Reviewed: 07/29/2015 Elsevier Interactive Patient Education  [employer].

## 2017-01-21 NOTE — Telephone Encounter (Signed)
Pt notified of cardioversion Friday 01/30/17 @ 11 and Pre op 01/27/17 @ 8 am. Pt voiced understanding. Pt will stop by office for lab slips and instructions.

## 2017-01-22 ENCOUNTER — Other Ambulatory Visit: Payer: Self-pay | Admitting: Family

## 2017-01-23 ENCOUNTER — Ambulatory Visit (INDEPENDENT_AMBULATORY_CARE_PROVIDER_SITE_OTHER): Payer: Medicare Other | Admitting: Emergency Medicine

## 2017-01-23 ENCOUNTER — Other Ambulatory Visit: Payer: Medicare Other

## 2017-01-23 ENCOUNTER — Encounter: Payer: Self-pay | Admitting: Emergency Medicine

## 2017-01-23 VITALS — BP 128/82 | HR 120 | Ht 68.0 in | Wt 241.0 lb

## 2017-01-23 DIAGNOSIS — J849 Interstitial pulmonary disease, unspecified: Secondary | ICD-10-CM

## 2017-01-23 DIAGNOSIS — J8410 Pulmonary fibrosis, unspecified: Secondary | ICD-10-CM

## 2017-01-23 NOTE — Assessment & Plan Note (Addendum)
Base predominant interstitial lung disease appears to be slightly more prominent 09/23/16 compare with prior CT 2 years ago. Interpretation slightly more difficult due to poor inspiration and technique, probably some evolving pulmonary edema. I like to repeat her CT scan to confirm interval change. I will repeat autoimmune labs today. She may ultimately be a candidate for anti-fibrotic therapy. Flu shot today

## 2017-01-23 NOTE — Assessment & Plan Note (Signed)
Noted to be hypoxemic on recent hospitalizations, currently on 3 L/m. Suspect that this is multifactorial. Obesity plus interstitial lung disease plus diastolic dysfunction and atrial fibrillation. She is interested in getting a portable O2 concentrator. We will confirm desaturation today and prescribe the POC. If she wants an Inogen device she will need to pursue this with the company directly

## 2017-01-23 NOTE — Addendum Note (Signed)
Addended by: Desmond Dike C on: 01/23/2017 03:29 PM   Modules accepted: Orders

## 2017-01-23 NOTE — Progress Notes (Signed)
Subjective:    Patient ID: Lorraine Leonard, female    DOB: Jan 09, 1941, 76 y.o.   MRN: [id number]  HPI  01/23/17 -- this is a follow-up visit for patient to her history of a pulmonary nodule in interstitial lung disease seen most prominently in the left lower lobe. She also has A Fib, HTN and diastolic CHF. At one time it was felt she may possibly have rheumatoid arthritis although this is no longer felt to be the case. 11/23/15 she had an echocardiogram that showed normal pulmonary pressures. A repeat echocardiogram 09/24/16 was performed and again showed normal PA pressure with an estimated peak pressure of 25 mmHg. We have followed her interstitial lung disease and pulmonary nodule with serial CT scans. She had an interval scan 09/23/16 which she was seen in the emergency department for dyspnea. This showed no evidence of pulmonary embolus, bilateral lower lobe interstitial prominence slightly more prominent than in 10/2014.    She describes increase in her cough, SOB, weakness. Was admitted in June and July for diuresis. Was noted to be hypoxemic and was started on O2 > wearing 3L/min, sleeps with it. She is under eval for a possible DCCV, but this was postponed due to anemia and a positive FOBT. She snores a little, still fatigued when she wakes up. She has refused eval for OSA in the past.    No evidence obstruction on spirometry.  PULMONARY FUNCTON TEST 02/03/2012  FVC 2.91  FEV1 2.1  FEV1/FVC 72.2  FVC  % Predicted 89  FEV % Predicted 90  FeF 25-75 1.45  FeF 25-75 % Predicted 2.5        Objective:   Physical Exam Vitals:   01/23/17 1431 01/23/17 1434  BP:  128/82  Pulse:  (!) 120  SpO2:  97%  Weight: 241 lb (109.3 kg)   Height: 5\' 8"  (1.727 m)    Gen: Pleasant, obese woman, in no distress,  normal affect  ENT: No lesions,  mouth clear,  oropharynx clear, no postnasal drip  Neck: No JVD, no TMG, no carotid bruits  Lungs: No use of accessory muscles, few insp crackles L  base  Cardiovascular: RRR, heart sounds normal, no murmur or gallops, no peripheral edema  Musculoskeletal: No deformities, no cyanosis or clubbing  Neuro: alert, non focal  Skin: Warm, no lesions or rashes  09/23/16 --  COMPARISON:  11/17/2014  FINDINGS: Cardiovascular: Satisfactory opacification of the pulmonary arteries to the segmental level. No evidence of pulmonary embolism. Main pulmonary artery is dilated measuring 4.2 cm in diameter. Right main pulmonary artery is dilated measuring 3 cm. Normal heart size. No pericardial effusion. Thoracic aorta is normal in caliber. No thoracic aortic dissection. Thoracic aortic atherosclerosis.  Mediastinum/Nodes: Right lower paratracheal lymph node measures 13 mm in short axis. Mild right hilar lymphadenopathy with the largest measuring 10 mm in short axis. Thyroid gland, trachea, and esophagus demonstrate no significant findings.  Lungs/Pleura: 6 mm right lower lobe pulmonary nodule. Bilateral lower lobe mild interstitial thickening consistent with mild fibrosis which has progressed compared with 11/17/2014. No focal consolidation, pleural effusion or pneumothorax.  Upper Abdomen: No acute abnormality.  Musculoskeletal: No acute osseous abnormality. No lytic or sclerotic osseous lesion.  Review of the MIP images confirms the above findings.  IMPRESSION: 1. No pulmonary artery embolus. 2. Dilatation of the main pulmonary artery as can be seen with pulmonary arterial hypertension. 3. Stable 6 mm right lower lobe pulmonary nodule unchanged compared with 11/17/2014.  Assessment & Plan:  Pulmonary fibrosis ([place]) Base predominant interstitial lung disease appears to be slightly more prominent 09/23/16 compare with prior CT 2 years ago. Interpretation slightly more difficult due to poor inspiration and technique, probably some evolving pulmonary edema. I like to repeat her CT scan to confirm interval change. I will  repeat autoimmune labs today. She may ultimately be a candidate for anti-fibrotic therapy. Flu shot today  Acute on chronic respiratory failure with hypoxia (HCC) Noted to be hypoxemic on recent hospitalizations, currently on 3 L/m. Suspect that this is multifactorial. Obesity plus interstitial lung disease plus diastolic dysfunction and atrial fibrillation. She is interested in getting a portable O2 concentrator. We will confirm desaturation today and prescribe the POC. If she wants an Inogen device she will need to pursue this with the company directly  Snoring She almost certainly has obstructive sleep apnea based on history, chronic hypoxemia. She refuses workup at this time.  Baltazar Apo, MD, PhD 01/23/2017, 3:07 PM [facility] [phone] or if no answer [phone]

## 2017-01-23 NOTE — Assessment & Plan Note (Signed)
She almost certainly has obstructive sleep apnea based on history, chronic hypoxemia. She refuses workup at this time.

## 2017-01-23 NOTE — Patient Instructions (Addendum)
We will repeat your pulmonary function testing.  We will perform a walking oximetry today to insure trhat 3L/min is adequate.  We will repeat blood work today We will repeat your CT scan chest in December 2018 to look for interval changes.  Flu shot today.  We can write a prescription for you to try to get an Inogen portable O2 concentrator.  Follow with Dr Lamonte Sakai in December 2018 to review your scan

## 2017-01-24 LAB — ANTI-JO 1 ANTIBODY, IGG: Anti JO-1: <0.2 AI (ref 0.0–0.9)

## 2017-01-26 LAB — SJOGREN'S SYNDROME ANTIBODS(SSA + SSB)
SSA (Ro) (ENA) Antibody, IgG: <1 AI
SSB (La) (ENA) Antibody, IgG: <1 AI

## 2017-01-26 LAB — CYCLIC CITRUL PEPTIDE ANTIBODY, IGG: Cyclic Citrullin Peptide Ab: <16 UNITS

## 2017-01-26 LAB — ANTI-SMITH ANTIBODY: ENA SM Ab Ser-aCnc: <1 AI

## 2017-01-26 LAB — ANTI-DNA ANTIBODY, DOUBLE-STRANDED: ds DNA Ab: <1 IU/mL

## 2017-01-26 LAB — RNP ANTIBODY: Ribonucleic Protein(ENA) Antibody, IgG: <1 AI

## 2017-01-26 LAB — ANA: Anti Nuclear Antibody(ANA): NEGATIVE

## 2017-01-26 LAB — ALDOLASE: Aldolase: 4.8 U/L (ref <=8.1)

## 2017-01-26 LAB — ANTI-SCLERODERMA ANTIBODY: Scleroderma (Scl-70) (ENA) Antibody, IgG: <1 AI

## 2017-01-26 LAB — RHEUMATOID FACTOR: Rhuematoid fact SerPl-aCnc: 43 IU/mL — ABNORMAL HIGH (ref <14)

## 2017-01-27 ENCOUNTER — Encounter (HOSPITAL_COMMUNITY)
Admission: RE | Admit: 2017-01-27 | Discharge: 2017-01-27 | Disposition: A | Discharge status: Home / Self Care | Payer: Medicare Other | Source: Ambulatory Visit | Attending: Cardiovascular Disease | Admitting: Cardiovascular Disease

## 2017-01-27 ENCOUNTER — Encounter (HOSPITAL_COMMUNITY): Payer: Self-pay

## 2017-01-27 DIAGNOSIS — D649 Anemia, unspecified: Secondary | ICD-10-CM | POA: Diagnosis not present

## 2017-01-27 DIAGNOSIS — Z7901 Long term (current) use of anticoagulants: Secondary | ICD-10-CM | POA: Diagnosis not present

## 2017-01-27 DIAGNOSIS — H3530 Unspecified macular degeneration: Secondary | ICD-10-CM | POA: Diagnosis not present

## 2017-01-27 DIAGNOSIS — I4891 Unspecified atrial fibrillation: Secondary | ICD-10-CM | POA: Diagnosis not present

## 2017-01-27 DIAGNOSIS — Z7951 Long term (current) use of inhaled steroids: Secondary | ICD-10-CM | POA: Diagnosis not present

## 2017-01-27 DIAGNOSIS — K219 Gastro-esophageal reflux disease without esophagitis: Secondary | ICD-10-CM | POA: Diagnosis not present

## 2017-01-27 DIAGNOSIS — I5032 Chronic diastolic (congestive) heart failure: Secondary | ICD-10-CM | POA: Diagnosis not present

## 2017-01-27 DIAGNOSIS — E785 Hyperlipidemia, unspecified: Secondary | ICD-10-CM | POA: Diagnosis not present

## 2017-01-27 DIAGNOSIS — E669 Obesity, unspecified: Secondary | ICD-10-CM | POA: Diagnosis not present

## 2017-01-27 DIAGNOSIS — F329 Major depressive disorder, single episode, unspecified: Secondary | ICD-10-CM | POA: Diagnosis not present

## 2017-01-27 DIAGNOSIS — J8410 Pulmonary fibrosis, unspecified: Secondary | ICD-10-CM | POA: Diagnosis not present

## 2017-01-27 DIAGNOSIS — F419 Anxiety disorder, unspecified: Secondary | ICD-10-CM | POA: Diagnosis not present

## 2017-01-27 DIAGNOSIS — R195 Other fecal abnormalities: Secondary | ICD-10-CM | POA: Diagnosis not present

## 2017-01-27 DIAGNOSIS — J9621 Acute and chronic respiratory failure with hypoxia: Secondary | ICD-10-CM | POA: Diagnosis not present

## 2017-01-27 DIAGNOSIS — Z9981 Dependence on supplemental oxygen: Secondary | ICD-10-CM | POA: Diagnosis not present

## 2017-01-27 DIAGNOSIS — I110 Hypertensive heart disease with heart failure: Secondary | ICD-10-CM | POA: Diagnosis not present

## 2017-01-27 DIAGNOSIS — Z6837 Body mass index (BMI) 37.0-37.9, adult: Secondary | ICD-10-CM | POA: Diagnosis not present

## 2017-01-27 DIAGNOSIS — E039 Hypothyroidism, unspecified: Secondary | ICD-10-CM | POA: Diagnosis not present

## 2017-01-27 DIAGNOSIS — M069 Rheumatoid arthritis, unspecified: Secondary | ICD-10-CM | POA: Diagnosis not present

## 2017-01-27 DIAGNOSIS — J449 Chronic obstructive pulmonary disease, unspecified: Secondary | ICD-10-CM | POA: Diagnosis not present

## 2017-01-27 DIAGNOSIS — E119 Type 2 diabetes mellitus without complications: Secondary | ICD-10-CM | POA: Diagnosis not present

## 2017-01-27 HISTORY — DX: Sleep apnea, unspecified: G47.30

## 2017-01-27 HISTORY — DX: Chronic obstructive pulmonary disease, unspecified: J44.9

## 2017-01-27 HISTORY — DX: Gout, unspecified: M10.9

## 2017-01-27 HISTORY — DX: Polyneuropathy, unspecified: G62.9

## 2017-01-27 LAB — CBC WITH DIFFERENTIAL/PLATELET
Basophils Absolute: 0 10*3/uL (ref 0.0–0.1)
Basophils Relative: 0 %
Eosinophils Absolute: 0.3 10*3/uL (ref 0.0–0.7)
Eosinophils Relative: 3 %
HCT: 37.3 % (ref 36.0–46.0)
Hemoglobin: 11.3 g/dL — ABNORMAL LOW (ref 12.0–15.0)
Lymphocytes Relative: 21 %
Lymphs Abs: 2.6 10*3/uL (ref 0.7–4.0)
MCH: 28.4 pg (ref 26.0–34.0)
MCHC: 30.3 g/dL (ref 30.0–36.0)
MCV: 93.7 fL (ref 78.0–100.0)
Monocytes Absolute: 0.8 10*3/uL (ref 0.1–1.0)
Monocytes Relative: 7 %
Neutro Abs: 8.5 10*3/uL — ABNORMAL HIGH (ref 1.7–7.7)
Neutrophils Relative %: 69 %
Platelets: 242 10*3/uL (ref 150–400)
RBC: 3.98 MIL/uL (ref 3.87–5.11)
RDW: 14.7 % (ref 11.5–15.5)
WBC: 12.3 10*3/uL — ABNORMAL HIGH (ref 4.0–10.5)

## 2017-01-27 LAB — BASIC METABOLIC PANEL
Anion gap: 8 (ref 5–15)
BUN: 16 mg/dL (ref 6–20)
CO2: 31 mmol/L (ref 22–32)
Calcium: 9.8 mg/dL (ref 8.9–10.3)
Chloride: 101 mmol/L (ref 101–111)
Creatinine, Ser: 0.99 mg/dL (ref 0.44–1.00)
GFR calc Af Amer: >60 mL/min (ref >60)
GFR calc non Af Amer: 54 mL/min — ABNORMAL LOW (ref >60)
Glucose, Bld: 222 mg/dL — ABNORMAL HIGH (ref 65–99)
Potassium: 3.9 mmol/L (ref 3.5–5.1)
Sodium: 140 mmol/L (ref 135–145)

## 2017-01-28 NOTE — Pre-Procedure Instructions (Signed)
Dr Patsey Berthold aware that patient states she is "diet controlled diabetic", and on no meds and that her glucose was 222. Will do CBG on arrival am of procedure.

## 2017-01-30 ENCOUNTER — Encounter (HOSPITAL_COMMUNITY)
Admission: RE | Disposition: A | Discharge status: Home / Self Care | Payer: Self-pay | Source: Ambulatory Visit | Attending: Cardiovascular Disease

## 2017-01-30 ENCOUNTER — Ambulatory Visit (HOSPITAL_COMMUNITY)
Admission: RE | Admit: 2017-01-30 | Discharge: 2017-01-30 | Disposition: A | Discharge status: Home / Self Care | Payer: Medicare Other | Source: Ambulatory Visit | Attending: Cardiovascular Disease | Admitting: Cardiovascular Disease

## 2017-01-30 ENCOUNTER — Ambulatory Visit (HOSPITAL_COMMUNITY): Payer: Medicare Other | Admitting: Anesthesiology

## 2017-01-30 ENCOUNTER — Encounter (HOSPITAL_COMMUNITY): Payer: Self-pay | Admitting: *Deleted

## 2017-01-30 DIAGNOSIS — J9621 Acute and chronic respiratory failure with hypoxia: Secondary | ICD-10-CM | POA: Diagnosis not present

## 2017-01-30 DIAGNOSIS — I4891 Unspecified atrial fibrillation: Secondary | ICD-10-CM | POA: Insufficient documentation

## 2017-01-30 DIAGNOSIS — J449 Chronic obstructive pulmonary disease, unspecified: Secondary | ICD-10-CM | POA: Insufficient documentation

## 2017-01-30 DIAGNOSIS — Z7951 Long term (current) use of inhaled steroids: Secondary | ICD-10-CM | POA: Insufficient documentation

## 2017-01-30 DIAGNOSIS — Z9981 Dependence on supplemental oxygen: Secondary | ICD-10-CM | POA: Insufficient documentation

## 2017-01-30 DIAGNOSIS — I480 Paroxysmal atrial fibrillation: Secondary | ICD-10-CM

## 2017-01-30 DIAGNOSIS — I110 Hypertensive heart disease with heart failure: Secondary | ICD-10-CM | POA: Diagnosis not present

## 2017-01-30 DIAGNOSIS — M069 Rheumatoid arthritis, unspecified: Secondary | ICD-10-CM | POA: Insufficient documentation

## 2017-01-30 DIAGNOSIS — K219 Gastro-esophageal reflux disease without esophagitis: Secondary | ICD-10-CM | POA: Insufficient documentation

## 2017-01-30 DIAGNOSIS — F329 Major depressive disorder, single episode, unspecified: Secondary | ICD-10-CM | POA: Insufficient documentation

## 2017-01-30 DIAGNOSIS — J8410 Pulmonary fibrosis, unspecified: Secondary | ICD-10-CM | POA: Diagnosis not present

## 2017-01-30 DIAGNOSIS — E785 Hyperlipidemia, unspecified: Secondary | ICD-10-CM | POA: Insufficient documentation

## 2017-01-30 DIAGNOSIS — D649 Anemia, unspecified: Secondary | ICD-10-CM | POA: Insufficient documentation

## 2017-01-30 DIAGNOSIS — E669 Obesity, unspecified: Secondary | ICD-10-CM | POA: Insufficient documentation

## 2017-01-30 DIAGNOSIS — R195 Other fecal abnormalities: Secondary | ICD-10-CM | POA: Insufficient documentation

## 2017-01-30 DIAGNOSIS — Z7901 Long term (current) use of anticoagulants: Secondary | ICD-10-CM | POA: Insufficient documentation

## 2017-01-30 DIAGNOSIS — E119 Type 2 diabetes mellitus without complications: Secondary | ICD-10-CM | POA: Insufficient documentation

## 2017-01-30 DIAGNOSIS — I5032 Chronic diastolic (congestive) heart failure: Secondary | ICD-10-CM | POA: Diagnosis not present

## 2017-01-30 DIAGNOSIS — E039 Hypothyroidism, unspecified: Secondary | ICD-10-CM | POA: Diagnosis not present

## 2017-01-30 DIAGNOSIS — H3530 Unspecified macular degeneration: Secondary | ICD-10-CM | POA: Insufficient documentation

## 2017-01-30 DIAGNOSIS — F419 Anxiety disorder, unspecified: Secondary | ICD-10-CM | POA: Insufficient documentation

## 2017-01-30 DIAGNOSIS — Z6837 Body mass index (BMI) 37.0-37.9, adult: Secondary | ICD-10-CM | POA: Insufficient documentation

## 2017-01-30 HISTORY — PX: CARDIOVERSION: SHX1299

## 2017-01-30 LAB — GLUCOSE, CAPILLARY: Glucose-Capillary: 179 mg/dL — ABNORMAL HIGH (ref 65–99)

## 2017-01-30 SURGERY — CARDIOVERSION
Anesthesia: Monitor Anesthesia Care

## 2017-01-30 MED ORDER — DEXAMETHASONE SODIUM PHOSPHATE 4 MG/ML IJ SOLN
INTRAMUSCULAR | Status: AC
  Filled 2017-01-30: qty 1

## 2017-01-30 MED ORDER — MIDAZOLAM HCL 2 MG/2ML IJ SOLN
INTRAMUSCULAR | Status: AC
  Filled 2017-01-30: qty 2

## 2017-01-30 MED ORDER — MIDAZOLAM HCL 2 MG/2ML IJ SOLN
1.0000 mg | INTRAMUSCULAR | Status: AC
  Administered 2017-01-30 (×2): 1 mg via INTRAVENOUS

## 2017-01-30 MED ORDER — PROPOFOL 500 MG/50ML IV EMUL
INTRAVENOUS | Status: DC | PRN
  Administered 2017-01-30: 150 ug/kg/min via INTRAVENOUS

## 2017-01-30 MED ORDER — PROPOFOL 10 MG/ML IV BOLUS
INTRAVENOUS | Status: AC
  Filled 2017-01-30: qty 20

## 2017-01-30 MED ORDER — LACTATED RINGERS IV SOLN
INTRAVENOUS | Status: DC
  Administered 2017-01-30: 10:00:00 via INTRAVENOUS

## 2017-01-30 MED ORDER — ONDANSETRON HCL 4 MG/2ML IJ SOLN
INTRAMUSCULAR | Status: AC
  Filled 2017-01-30: qty 2

## 2017-01-30 MED ORDER — ONDANSETRON HCL 4 MG/2ML IJ SOLN
4.0000 mg | Freq: Once | INTRAMUSCULAR | Status: AC
  Administered 2017-01-30: 4 mg via INTRAVENOUS

## 2017-01-30 MED ORDER — DEXAMETHASONE SODIUM PHOSPHATE 4 MG/ML IJ SOLN
4.0000 mg | Freq: Once | INTRAMUSCULAR | Status: AC
  Administered 2017-01-30: 4 mg via INTRAVENOUS

## 2017-01-30 NOTE — CV Procedure (Signed)
Anesthesia: Propofl On Rx Eliquis with no missed doses Rhythm afib rate 122 Biphasic shock x 2 120 _>150J's Converted to SB with PaC;s  No immediate neurologic sequelae F/U Dr Jolyn Lent

## 2017-01-30 NOTE — Interval H&P Note (Signed)
History and Physical Interval Note:  01/30/2017 9:29 AM  Lorraine Leonard  has presented today for surgery, with the diagnosis of persistant a-fib  The various methods of treatment have been discussed with the patient and family. After consideration of risks, benefits and other options for treatment, the patient has consented to  Procedure(s): CARDIOVERSION (N/A) as a surgical intervention .  The patient's history has been reviewed, patient examined, no change in status, stable for surgery.  I have reviewed the patient's chart and labs.  Questions were answered to the patient's satisfaction.     Jenkins Rouge

## 2017-01-30 NOTE — H&P (View-Only) (Signed)
Cardiology Office Note   Date:  01/13/2017   ID:  DAJA SHUPING, DOB 1940-06-23, MRN [id number]  PCP:  Sharion Balloon, FNP  Cardiologist:  [facility]  Chief Complaint  Patient presents with  . Atrial Fibrillation     History of Present Illness: Lorraine Leonard is a 76 y.o. female who presents for ongoing assessment and management of atrial fibrillation, history of pulmonary fibrosis and interstitial lung disease. The patient was last seen by Dr. Percival Spanish on 12/17/2016, for newly diagnosed atrial fibrillation.CHADS VASC Score of 6. The patient was placed on metoprolol 100 mg daily, plan for outpatient cardioversion, and was continued on ELIQUIS. There is no evidence of volume overload at that time.   She was scheduled for cardioversion on 12/26/2016. However, the patient was found to have positive fecal occult blood, with anemia (hemoglobin from 12.4-10.8 on labs. There was a concern for GI bleed, and she was referred to GI for further evaluation. ELIQUIS was held, and metoprolol was increased to 150 mg daily.  She comes today having been restarted on Eliquis by GI. We are still awaiting records from them to confirm. She does not remember whether she is scheduled to see GI again or not. She does not report bleeding or hemoptysis since starting back on Eliquis. Her main complaint is racing HR while at rest. She states it goes up to 120 bpm sometimes. She does not know what doses of medications she takes. Granddaughter fills her medication tray.   Past Medical History:  Diagnosis Date  . Acute diastolic CHF (congestive heart failure) ([place]) 11/11/2016  . Allergic rhinitis   . Anxiety   . Anxiety   . Atrial fibrillation ([place]) 09/2016  . Depression   . Diabetes mellitus    pre-diabetic  . Diverticulosis   . Dyspnea   . Dysrhythmia   . GERD (gastroesophageal reflux disease)   . Hyperlipidemia   . Hypertension   . Hypothyroidism   . Lung fibrosis ([place])   . Macular degeneration   . Pulmonary  fibrosis ([place])   . Rheumatoid arthritis(714.0)   . Urticaria     Past Surgical History:  Procedure Laterality Date  . EUS N/A 10/03/2015   Procedure: UPPER ENDOSCOPIC ULTRASOUND (EUS) RADIAL;  Surgeon: Arta Silence, MD;  Location: [facility] ENDOSCOPY;  Service: Endoscopy;  Laterality: N/A;  . FINE NEEDLE ASPIRATION N/A 10/03/2015   Procedure: FINE NEEDLE ASPIRATION (FNA) RADIAL;  Surgeon: Arta Silence, MD;  Location: [facility] ENDOSCOPY;  Service: Endoscopy;  Laterality: N/A;  . 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 LUMBAR DISC SURGERY     x 2  . TUBAL LIGATION    . VAGINAL HYSTERECTOMY       Current Outpatient Prescriptions  Medication Sig Dispense Refill  . albuterol (PROVENTIL HFA;VENTOLIN HFA) 108 (90 Base) MCG/ACT inhaler Inhale 2 puffs into the lungs every 6 (six) hours as needed for wheezing or shortness of breath. 1 Inhaler 2  . apixaban (ELIQUIS) 5 MG TABS tablet Take 1 tablet (5 mg total) by mouth 2 (two) times daily. 60 tablet 1  . azelastine (ASTELIN) 0.1 % nasal spray PLACE 2 SPRAYS INTO BOTH NOSTRILS 2 (TWO) TIMES DAILY AS NEEDED. (Patient taking differently: PLACE 2 SPRAYS INTO BOTH NOSTRILS AT BEDTIME) 30 mL 4  . calcium citrate (CALCITRATE - DOSED IN MG ELEMENTAL CALCIUM) 950 MG tablet Take 1 tablet by mouth 2 (two) times daily.     . Cholecalciferol 2000 units CAPS Take  2,000 Units by mouth daily.     Marland Kitchen conjugated estrogens (PREMARIN) vaginal cream Place 0.5 g vaginally every Thursday. Use as directed    . cyanocobalamin 500 MCG tablet Take 500 mcg by mouth daily.     . diazepam (VALIUM) 5 MG tablet TAKE 1 TABLET TWICE A DAY AS NEEDED FOR ANXIETY (Patient taking differently: Take 5 mg by mouth 2 (two) times daily as needed for anxiety. ) 60 tablet 5  . diltiazem (CARDIZEM CD) 180 MG 24 hr capsule TAKE 1 CAPSULE BY MOUTH EVERY DAY 30 capsule 5  . docusate sodium (COLACE) 100 MG capsule Take 200 mg by mouth daily as needed for mild constipation.    Marland Kitchen EPINEPHrine 0.3  mg/0.3 mL IJ SOAJ injection Inject 0.3 mg into the muscle once as needed (For anaphylaxis.).    Marland Kitchen esomeprazole (NEXIUM) 40 MG capsule Take 40 mg by mouth daily.     . fluticasone (FLONASE) 50 MCG/ACT nasal spray PLACE 1 SPRAY INTO BOTH NOSTRILS 2 (TWO) TIMES DAILY AS NEEDED FOR ALLERGIES OR RHINITIS. 16 g 2  . furosemide (LASIX) 40 MG tablet TAKE 1 TABLET BY MOUTH EVERY DAY 30 tablet 5  . INCRUSE ELLIPTA 62.5 MCG/INH AEPB TAKE 1 PUFF BY MOUTH EVERY DAY  3  . levocetirizine (XYZAL) 5 MG tablet Take 5 mg by mouth every evening.     Marland Kitchen levothyroxine (SYNTHROID, LEVOTHROID) 100 MCG tablet TAKE 1 TABLET (100 MCG TOTAL) BY MOUTH DAILY BEFORE BREAKFAST. 90 tablet 1  . metoprolol succinate (TOPROL-XL) 100 MG 24 hr tablet Take 1 and 1/2 Tablet Daily. Take with or immediately following a meal. 45 tablet 6  . mometasone (ELOCON) 0.1 % cream APPLY TO AFFECTED AREA DAILY 45 g 2  . Multiple Vitamins-Minerals (PRESERVISION AREDS 2 PO) Take 1 tablet by mouth 2 (two) times daily.    . ondansetron (ZOFRAN) 8 MG tablet Take 8 mg by mouth every 8 (eight) hours as needed for nausea or vomiting.    Marland Kitchen OVER THE COUNTER MEDICATION Place 1 drop into both eyes as needed (For dry eyes.). Systane Gel Drops    . PATADAY 0.2 % SOLN Place 1 drop into both eyes daily.    . polyethylene glycol powder (GLYCOLAX/MIRALAX) powder TAKE 17 G BY MOUTH 2 (TWO) TIMES DAILY AS NEEDED. 3162 g 0  . predniSONE (DELTASONE) 10 MG tablet Take 10 mg by mouth daily with breakfast.    . rosuvastatin (CRESTOR) 10 MG tablet TAKE 1 TABLET BY MOUTH AT BEDTIME 30 tablet 0  . traMADol (ULTRAM) 50 MG tablet TAKE 1 TABLET BY MOUTH EVERY 6 HOURS AS NEEDED (Patient taking differently: TAKE 1 TABLET BY MOUTH EVERY 6 HOURS AS NEEDED FOR PAIN) 90 tablet 2  . trimethoprim (TRIMPEX) 100 MG tablet Take 100 mg by mouth daily.    Marland Kitchen VIGAMOX 0.5 % ophthalmic solution Apply 1 drop to eye as needed (after eye injection).   12  . vitamin C (ASCORBIC ACID) 500 MG tablet  Take 500 mg by mouth daily.    . Vitamin D, Ergocalciferol, (DRISDOL) 50000 units CAPS capsule TAKE 1 CAPSULE (50,000 UNITS TOTAL) BY MOUTH EVERY 7 (SEVEN) DAYS. (Patient taking differently: Take 50,000 Units by mouth every 7 (seven) days. Take on Wednesday) 12 capsule 0  . vitamin E 400 UNIT capsule Take 400 Units by mouth daily.     No current facility-administered medications for this visit.     Allergies:   Allopurinol and Mobic [meloxicam]    Social  History:  The patient  reports that she has never smoked. She has never used smokeless tobacco. She reports that she does not drink alcohol or use drugs.   Family History:  The patient's family history includes Anxiety disorder in her unknown relative; Asthma in her unknown relative; Coronary artery disease in her unknown relative; Diabetes in her unknown relative; Hyperlipidemia in her unknown relative; Hypertension in her unknown relative; Kidney disease in her sister; Lung cancer in her sister and sister; Migraines in her unknown relative; Stroke in her unknown relative; Suicidality in her son.    ROS: All other systems are reviewed and negative. Unless otherwise mentioned in H&P    PHYSICAL EXAM: VS:  BP 126/72   Pulse (!) 118   Ht 5\' 8"  (1.727 m)   Wt 246 lb (111.6 kg)   SpO2 98%   BMI 37.40 kg/m  , BMI Body mass index is 37.4 kg/m. GEN: Well nourished, well developed, in no acute distress  HEENT: normal  Neck: no JVD, carotid bruits, or masses Cardiac: IRRR; tachycardic, no murmurs, rubs, or gallops,no edema  Respiratory:  Bilateral crackles in the bases. Wearing O2.  GI: soft, nontender, nondistended, + BS MS: no deformity or atrophy  Skin: warm and dry, no rash Neuro:  Strength and sensation are intact Psych: euthymic mood, full affect   Recent Labs: 09/25/2016: Magnesium 2.0 11/10/2016: B Natriuretic Peptide 607.0; TSH 2.402 12/25/2016: ALT 23; BUN 18; Creatinine, Ser 1.03; Potassium 4.6; Sodium 143 01/01/2017:  Hemoglobin 11.7; Platelets 246    Lipid Panel    Component Value Date/Time   CHOL 135 07/17/2016 1501   TRIG 148 07/17/2016 1501   TRIG 169 (H) 08/17/2014 1136   HDL 36 (L) 07/17/2016 1501   HDL 44 08/17/2014 1136   CHOLHDL 3.8 07/17/2016 1501   LDLCALC 69 07/17/2016 1501      Wt Readings from Last 3 Encounters:  01/13/17 246 lb (111.6 kg)  12/25/16 254 lb 9.6 oz (115.5 kg)  12/17/16 249 lb (112.9 kg)    Other studies Reviewed: Echocardiogram 09-30-16 Left ventricle: The cavity size was normal. Wall thickness was   increased in a pattern of mild LVH. Systolic function was normal.   The estimated ejection fraction was in the range of 60% to 65%.   Wall motion was normal; there were no regional wall motion   abnormalities. Features are consistent with a pseudonormal left   ventricular filling pattern, with concomitant abnormal relaxation   and increased filling pressure (grade 2 diastolic dysfunction). - Aortic valve: Mildly calcified annulus. Trileaflet. - Mitral valve: Calcified annulus. - Right atrium: Central venous pressure (est): 3 mm Hg. - Atrial septum: No defect or patent foramen ovale was identified. - Tricuspid valve: There was trivial regurgitation. - Pulmonary arteries: PA peak pressure: 25 mm Hg (S). - Pericardium, extracardiac: A prominent pericardial fat pad was   present.   ASSESSMENT AND PLAN:  1. Atrial fib: Patients heart rate is not well controlled. She is not sure what dose of metoprolol she is taking. She should be on 150 mg daily. She continues to have episodes of racing HR. Will increase to 200 mg daily. She will continue Eliquis. We have requested records again from [place] for confirmation of ok to restart Eliquis and ongoing testing. Once we have gotten full information concerning GI plan, will make further arrangements for DCCV again. She will need to be on uninterrupted anticoagulation for 3 weeks prior to having DCCV  2. Pulmonary fibrosis:  She is oxygen dependent currently. She should follow with pulmonary,   3. FOBT Positive: Followed by GI. She is to continue labs. We are awaiting notes from GI in order to make further plans for DCCV.     Current medicines are reviewed at length with the patient today.    ADDENDUM: 01/19/2017  Reviewed records from GI, Dr. Ronnette Juniper, who saw patient on 01/07/2017 His noted states that she is not on Eliquis, that she stopped taking it one month ago. They did recommend that she start back on Eliquis that day as there was no evidence of bleeding. She will need to be on Eliquis for 3 weeks prior to scheduling DCCV. That will take her into the second week of October. Will have DCCV planned for then. She has follow up appointment with Dr. Harl Bowie on Feb 18, 2017. She will keep that appointment.    Phill Myron. West Pugh, ANP, AACC   01/13/2017 4:06 PM    [facility] [address], [place], [place] [address] Phone: [phone]; Fax: [phone]

## 2017-01-30 NOTE — Anesthesia Preprocedure Evaluation (Signed)
Anesthesia Evaluation  Patient identified by MRN, date of birth, ID band Patient awake    Reviewed: Allergy & Precautions, NPO status , Patient's Chart, lab work & pertinent test results, reviewed documented beta blocker date and time   Airway Mallampati: III  TM Distance: >3 FB Neck ROM: Full  Mouth opening: Limited Mouth Opening  Dental  (+) Teeth Intact   Pulmonary shortness of breath, sleep apnea , COPD ( Acute on chronic respiratory failure with hypoxia ),  oxygen dependent,  Pulm fibrosis    + decreased breath sounds      Cardiovascular hypertension, Pt. on medications and Pt. on home beta blockers +CHF  + dysrhythmias Atrial Fibrillation  Rhythm:Regular Rate:Normal     Neuro/Psych PSYCHIATRIC DISORDERS Anxiety Depression    GI/Hepatic GERD  ,  Endo/Other  diabetesHypothyroidism   Renal/GU Renal InsufficiencyRenal disease     Musculoskeletal   Abdominal (+) + obese,   Peds  Hematology   Anesthesia Other Findings   Reproductive/Obstetrics                             Anesthesia Physical Anesthesia Plan  ASA: III  Anesthesia Plan: MAC   Post-op Pain Management:    Induction: Intravenous  PONV Risk Score and Plan:   Airway Management Planned: Simple Face Mask  Additional Equipment:   Intra-op Plan:   Post-operative Plan:   Informed Consent: I have reviewed the patients History and Physical, chart, labs and discussed the procedure including the risks, benefits and alternatives for the proposed anesthesia with the patient or authorized representative who has indicated his/her understanding and acceptance.     Plan Discussed with:   Anesthesia Plan Comments:         Anesthesia Quick Evaluation

## 2017-01-30 NOTE — Transfer of Care (Signed)
Immediate Anesthesia Transfer of Care Note  Patient: Lorraine Leonard  Procedure(s) Performed: CARDIOVERSION (N/A )  Patient Location: PACU  Anesthesia Type:MAC  Level of Consciousness: awake, alert , oriented and patient cooperative  Airway & Oxygen Therapy: Patient Spontanous Breathing and Patient connected to nasal cannula oxygen  Post-op Assessment: Report given to RN and Post -op Vital signs reviewed and stable  Post vital signs: Reviewed and stable  Last Vitals:  Vitals:   01/30/17 1055 01/30/17 1122  BP: 138/81 (!) 105/53  Pulse:    Resp: (!) 25 19  Temp:  37 C  SpO2: 94% 99%    Last Pain:  Vitals:   01/30/17 1000  TempSrc: Oral      Patients Stated Pain Goal: 8 (66/44/03 4742)  Complications: No apparent anesthesia complications

## 2017-01-30 NOTE — Anesthesia Postprocedure Evaluation (Signed)
Anesthesia Post Note  Patient: Lorraine Leonard  Procedure(s) Performed: CARDIOVERSION (N/A )  Patient location during evaluation: PACU Anesthesia Type: MAC Level of consciousness: awake and alert, oriented and patient cooperative Pain management: pain level controlled Vital Signs Assessment: post-procedure vital signs reviewed and stable Respiratory status: spontaneous breathing, respiratory function stable and patient connected to nasal cannula oxygen Cardiovascular status: stable Postop Assessment: no apparent nausea or vomiting Anesthetic complications: no     Last Vitals:  Vitals:   01/30/17 1100 01/30/17 1122  BP:  (!) 105/53  Pulse:    Resp: (!) 26 19  Temp:  37 C  SpO2: 94% 99%    Last Pain:  Vitals:   01/30/17 1000  TempSrc: Oral                 Salena Ortlieb A

## 2017-01-30 NOTE — Addendum Note (Signed)
Addendum  created 01/30/17 1154 by Mickel Baas, CRNA   Charge Capture section accepted

## 2017-01-30 NOTE — Anesthesia Procedure Notes (Signed)
Procedure Name: MAC Date/Time: 01/30/2017 11:00 AM Performed by: Andree Elk, AMY A Pre-anesthesia Checklist: Patient identified, Emergency Drugs available, Suction available, Patient being monitored and Timeout performed Oxygen Delivery Method: Simple face mask

## 2017-01-30 NOTE — Discharge Instructions (Signed)
Electrical Cardioversion, Care After °This sheet gives you information about how to care for yourself after your procedure. Your health care provider may also give you more specific instructions. If you have problems or questions, contact your health care provider. °What can I expect after the procedure? °After the procedure, it is common to have: °· Some redness on the skin where the shocks were given. ° °Follow these instructions at home: °· Do not drive for 24 hours if you were given a medicine to help you relax (sedative). °· Take over-the-counter and prescription medicines only as told by your health care provider. °· Ask your health care provider how to check your pulse. Check it often. °· Rest for 48 hours after the procedure or as told by your health care provider. °· Avoid or limit your caffeine use as told by your health care provider. °Contact a health care provider if: °· You feel like your heart is beating too quickly or your pulse is not regular. °· You have a serious muscle cramp that does not go away. °Get help right away if: °· You have discomfort in your chest. °· You are dizzy or you feel faint. °· You have trouble breathing or you are short of breath. °· Your speech is slurred. °· You have trouble moving an arm or leg on one side of your body. °· Your fingers or toes turn cold or blue. °This information is not intended to replace advice given to you by your health care provider. Make sure you discuss any questions you have with your health care provider. °Document Released: 01/26/2013 Document Revised: 11/09/2015 Document Reviewed: 10/12/2015 °Elsevier Interactive Patient Education © 2018 [employer] ° °

## 2017-02-01 ENCOUNTER — Other Ambulatory Visit: Payer: Self-pay | Admitting: Family

## 2017-02-02 ENCOUNTER — Encounter (HOSPITAL_COMMUNITY): Payer: Self-pay | Admitting: Cardiovascular Disease

## 2017-02-03 ENCOUNTER — Telehealth: Payer: Self-pay | Admitting: Family

## 2017-02-03 ENCOUNTER — Telehealth: Payer: Self-pay | Admitting: Cardiology

## 2017-02-03 NOTE — Telephone Encounter (Signed)
I will forward to Dr.Branch for advice 

## 2017-02-03 NOTE — Telephone Encounter (Signed)
Patient states that since having Cardioversion she has been weak. Is asking if she needs to change any of her medications. / tg

## 2017-02-03 NOTE — Telephone Encounter (Signed)
What medication did she decrease? If related to the heart she needs to call her Cardiologists and make follow up.

## 2017-02-03 NOTE — Telephone Encounter (Signed)
Patient states that she decrease metoprolol and will call cardiologist

## 2017-02-04 ENCOUNTER — Telehealth: Payer: Self-pay | Admitting: Cardiology

## 2017-02-04 NOTE — Telephone Encounter (Signed)
Can she come in for a nursing visit for EKG and vitals check today or tomorrow  Zandra Abts MD

## 2017-02-04 NOTE — Telephone Encounter (Signed)
Pt scheduled for nurse visit tomorrow 10/18

## 2017-02-04 NOTE — Telephone Encounter (Signed)
Contacted by granddaughter Comptroller) and informed her that we did not have permission to speak with her about patient's health information. Granddaughter said that she was currently with patient fixing her pill box. Nurse asked to speak with patient. Patient said that she called to confirm whether she is supposed to be on eliquis 5 mg because they didn't see it on the list of medications they were given after DCCV. Nurse reviewed list that was given and confirmed that eliquis is listed and that patient is suppose to be on it. Patient and granddaughter verbalized understanding of plan.

## 2017-02-04 NOTE — Telephone Encounter (Signed)
Please give pt a call--pt not feeling well since having procedure last Friday, has questions about her Beta Blockers [phone]

## 2017-02-04 NOTE — Telephone Encounter (Signed)
Please call Elwyn Reach (grandaughter) @ [phone]

## 2017-02-05 ENCOUNTER — Ambulatory Visit (INDEPENDENT_AMBULATORY_CARE_PROVIDER_SITE_OTHER): Payer: Medicare Other | Admitting: Cardiology

## 2017-02-05 ENCOUNTER — Ambulatory Visit (INDEPENDENT_AMBULATORY_CARE_PROVIDER_SITE_OTHER): Payer: Medicare Other

## 2017-02-05 VITALS — BP 140/70 | HR 42 | Ht 68.0 in | Wt 253.0 lb

## 2017-02-05 DIAGNOSIS — I5033 Acute on chronic diastolic (congestive) heart failure: Secondary | ICD-10-CM

## 2017-02-05 DIAGNOSIS — R001 Bradycardia, unspecified: Secondary | ICD-10-CM

## 2017-02-05 DIAGNOSIS — I4891 Unspecified atrial fibrillation: Secondary | ICD-10-CM | POA: Diagnosis not present

## 2017-02-05 DIAGNOSIS — I480 Paroxysmal atrial fibrillation: Secondary | ICD-10-CM | POA: Diagnosis not present

## 2017-02-05 MED ORDER — FUROSEMIDE 40 MG PO TABS: 60.0000 mg | ORAL_TABLET | Freq: Every day | ORAL | 3 refills | Status: DC

## 2017-02-05 NOTE — Progress Notes (Signed)
Arrives for EKG,VS post DCCV, very weak.EKG notes HR 42 at triage,states HR in the 30's at home. Patient states she reduced her dose of Toprol from 150 mg to 100 mg daily 4 days ago  Dr.Branch to see patient

## 2017-02-05 NOTE — Progress Notes (Signed)
Clinical Summary Lorraine Leonard is a 76 y.o.female seen today as an add on focused visit for recent troubles with bradycardia and fatigue.   1. Bradycardia - cardioversion last week for afib -  Since that time significant fatigue, SOB/DOE, and weight gain - she has lowered the dose of her Toprol to 100mg  daily on her own without imrpvoment of symptoms  2. Weight gain - no recent edema, +abdominal distension.  - by our scale up 12 lbs over the last 2 weeks    Past Medical History:  Diagnosis Date  . Acute diastolic CHF (congestive heart failure) ([place]) 11/11/2016  . Allergic rhinitis   . Anxiety   . Anxiety   . Arthritis   . Atrial fibrillation ([place]) 09/2016  . COPD (chronic obstructive pulmonary disease) ([place])   . Depression   . Diabetes mellitus    pre-diabetic  . Diverticulosis   . Dyspnea   . Dysrhythmia   . GERD (gastroesophageal reflux disease)   . Gout   . Hyperlipidemia   . Hypertension   . Hypothyroidism   . Lung fibrosis ([place])   . Macular degeneration   . Neuropathy   . Pulmonary fibrosis ([place])   . Rheumatoid arthritis(714.0)   . Sleep apnea    uses 3 liters of o2 24/7  . Urticaria      Allergies  Allergen Reactions  . Allopurinol Swelling    Feet and legs swell  . Mobic [Meloxicam] Swelling and Other (See Comments)    Feet and Leg swelling     Current Outpatient Prescriptions  Medication Sig Dispense Refill  . albuterol (PROVENTIL HFA;VENTOLIN HFA) 108 (90 Base) MCG/ACT inhaler Inhale 2 puffs into the lungs every 6 (six) hours as needed for wheezing or shortness of breath. 1 Inhaler 2  . azelastine (ASTELIN) 0.1 % nasal spray PLACE 2 SPRAYS INTO BOTH NOSTRILS 2 (TWO) TIMES DAILY AS NEEDED. 30 mL 5  . calcium citrate (CALCITRATE - DOSED IN MG ELEMENTAL CALCIUM) 950 MG tablet Take 1 tablet by mouth 2 (two) times daily.     . Cholecalciferol 2000 units CAPS Take 2,000 Units by mouth daily.     Marland Kitchen conjugated estrogens (PREMARIN) vaginal cream Place  0.5 g vaginally every Thursday. Use as directed    . cyanocobalamin 500 MCG tablet Take 500 mcg by mouth daily.     . diazepam (VALIUM) 5 MG tablet TAKE 1 TABLET TWICE A DAY AS NEEDED FOR ANXIETY (Patient taking differently: Take 5 mg by mouth 2 (two) times daily as needed for anxiety. ) 60 tablet 5  . diltiazem (CARDIZEM CD) 180 MG 24 hr capsule TAKE 1 CAPSULE BY MOUTH EVERY DAY 30 capsule 5  . docusate sodium (COLACE) 100 MG capsule Take 200 mg by mouth daily as needed for mild constipation.    Marland Kitchen ELIQUIS 5 MG TABS tablet TAKE 1 TABLET BY MOUTH TWICE A DAY 60 tablet 1  . EPINEPHrine 0.3 mg/0.3 mL IJ SOAJ injection Inject 0.3 mg into the muscle once as needed (For anaphylaxis.).    Marland Kitchen esomeprazole (NEXIUM) 40 MG capsule Take 40 mg by mouth daily.     . ferrous sulfate 325 (65 FE) MG tablet Take 325 mg by mouth 2 (two) times daily with a meal.    . fluticasone (FLONASE) 50 MCG/ACT nasal spray PLACE 1 SPRAY INTO BOTH NOSTRILS 2 (TWO) TIMES DAILY AS NEEDED FOR ALLERGIES OR RHINITIS. 16 g 2  . furosemide (LASIX) 40 MG tablet TAKE 1  TABLET BY MOUTH EVERY DAY 30 tablet 5  . INCRUSE ELLIPTA 62.5 MCG/INH AEPB TAKE 1 PUFF BY MOUTH EVERY DAY  3  . levocetirizine (XYZAL) 5 MG tablet Take 5 mg by mouth every evening.     Marland Kitchen levothyroxine (SYNTHROID, LEVOTHROID) 100 MCG tablet TAKE 1 TABLET (100 MCG TOTAL) BY MOUTH DAILY BEFORE BREAKFAST. 90 tablet 1  . metoprolol succinate (TOPROL-XL) 100 MG 24 hr tablet Take 100 mg by mouth daily. Take with or immediately following a meal.    . mometasone (ELOCON) 0.1 % cream APPLY TO AFFECTED AREA DAILY 45 g 2  . Multiple Vitamins-Minerals (PRESERVISION AREDS 2 PO) Take 1 tablet by mouth 2 (two) times daily.    . ondansetron (ZOFRAN) 8 MG tablet Take 8 mg by mouth every 8 (eight) hours as needed for nausea or vomiting.    Marland Kitchen OVER THE COUNTER MEDICATION Place 1 drop into both eyes as needed (For dry eyes.). Systane Gel Drops    . PATADAY 0.2 % SOLN Place 1 drop into both eyes  daily.    . polyethylene glycol powder (GLYCOLAX/MIRALAX) powder TAKE 17 G BY MOUTH 2 (TWO) TIMES DAILY AS NEEDED. 3162 g 0  . predniSONE (DELTASONE) 10 MG tablet Take 10 mg by mouth daily with breakfast.    . rosuvastatin (CRESTOR) 10 MG tablet TAKE 1 TABLET BY MOUTH AT BEDTIME 30 tablet 0  . traMADol (ULTRAM) 50 MG tablet TAKE 1 TABLET BY MOUTH EVERY 6 HOURS AS NEEDED (Patient taking differently: TAKE 1 TABLET BY MOUTH EVERY 6 HOURS AS NEEDED FOR PAIN) 90 tablet 2  . trimethoprim (TRIMPEX) 100 MG tablet Take 100 mg by mouth daily.    Marland Kitchen VIGAMOX 0.5 % ophthalmic solution Apply 1 drop to eye as needed (after eye injection).   12  . vitamin C (ASCORBIC ACID) 500 MG tablet Take 500 mg by mouth daily.    . Vitamin D, Ergocalciferol, (DRISDOL) 50000 units CAPS capsule TAKE 1 CAPSULE (50,000 UNITS TOTAL) BY MOUTH EVERY 7 (SEVEN) DAYS. (Patient taking differently: Take 50,000 Units by mouth every 7 (seven) days. Take on Wednesday) 12 capsule 0  . vitamin E 400 UNIT capsule Take 400 Units by mouth daily.     No current facility-administered medications for this visit.      Past Surgical History:  Procedure Laterality Date  . CARDIOVERSION N/A 01/30/2017   Procedure: CARDIOVERSION;  Surgeon: Josue Hector, MD;  [facility];  Service: Cardiovascular;  Laterality: N/A;  . CATARACT EXTRACTION, BILATERAL Bilateral   . EUS N/A 10/03/2015   Procedure: UPPER ENDOSCOPIC ULTRASOUND (EUS) RADIAL;  Surgeon: Arta Silence, MD;  Location: [facility];  Service: Endoscopy;  Laterality: N/A;  . FINE NEEDLE ASPIRATION N/A 10/03/2015   Procedure: FINE NEEDLE ASPIRATION (FNA) RADIAL;  Surgeon: Arta Silence, MD;  Location: [facility];  Service: Endoscopy;  Laterality: N/A;  . 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 LUMBAR DISC SURGERY     x 2  . TUBAL LIGATION    . VAGINAL HYSTERECTOMY       Allergies  Allergen Reactions  . Allopurinol Swelling    Feet and legs swell  . Mobic  [Meloxicam] Swelling and Other (See Comments)    Feet and Leg swelling      Family History  Problem Relation Age of Onset  . Diabetes Unknown   . Hypertension Unknown   . Coronary artery disease Unknown   . Stroke Unknown   .  Asthma Unknown   . Kidney disease Sister   . Lung cancer Sister   . Lung cancer Sister   . Suicidality Son        committed suicide in 2009  . Hyperlipidemia Unknown   . Anxiety disorder Unknown   . Migraines Unknown      Social History Lorraine Leonard reports that she has never smoked. She has never used smokeless tobacco. Lorraine Leonard reports that she does not drink alcohol.   Review of Systems CONSTITUTIONAL: +fatigu HEENT: Eyes: No visual loss, blurred vision, double vision or yellow sclerae.No hearing loss, sneezing, congestion, runny nose or sore throat.  SKIN: No rash or itching.  CARDIOVASCULAR: per hpi RESPIRATORY: per hpi GASTROINTESTINAL: No anorexia, nausea, vomiting or diarrhea. No abdominal pain or blood.  GENITOURINARY: No burning on urination, no polyuria NEUROLOGICAL: No headache, dizziness, syncope, paralysis, ataxia, numbness or tingling in the extremities. No change in bowel or bladder control.  MUSCULOSKELETAL: No muscle, back pain, joint pain or stiffness.  LYMPHATICS: No enlarged nodes. No history of splenectomy.  PSYCHIATRIC: No history of depression or anxiety.  ENDOCRINOLOGIC: No reports of sweating, cold or heat intolerance. No polyuria or polydipsia.  Marland Kitchen   Physical Examination Gen: resting comfortably, no acute distress HEENT: no scleral icterus, pupils equal round and reactive, no palptable cervical adenopathy,  CV: brady, no m/r/g Resp: Clear to auscultation bilaterally GI: abdomen is soft, non-tender, non-distended, normal bowel sounds, no hepatosplenomegaly MSK: extremities are warm, no edema.  Skin: warm, no rash Neuro:  no focal deficits Psych: appropriate affect      Assessment and Plan  1.  Bradycardia/fatigue/Afibbra - heart rates in 40s today, this is due to her recent cardioversion from afib and continued doses of av nodal agents - EKG today and extended rhythm strip shows sinus brady to 40s, juncitonal escape beats - we will stop her Toprol, continue dilt at this time  2. Weight gain/Acute on chronic diastolic HF - significant weight gain and abdominla distension, increased SOB - we will increase lasix to 60mg  daily   F/u this Monday to rrevalute.       Arnoldo Lenis, M.D.

## 2017-02-05 NOTE — Patient Instructions (Addendum)
STOP Metoprolol    INCREASE Lasix to 60 mg daily     Follow up on Monday, 10/22 at 11:40 am  With Dr Harl Bowie       Thank you for choosing [facility] !

## 2017-02-09 ENCOUNTER — Inpatient Hospital Stay (HOSPITAL_COMMUNITY)
Admission: EM | Admit: 2017-02-09 | Discharge: 2017-02-16 | DRG: 309 | Disposition: A | Discharge status: Home Health | Payer: Medicare Other | Attending: Internal Medicine | Admitting: Internal Medicine

## 2017-02-09 ENCOUNTER — Encounter (HOSPITAL_COMMUNITY): Payer: Self-pay | Admitting: Emergency Medicine

## 2017-02-09 ENCOUNTER — Ambulatory Visit (INDEPENDENT_AMBULATORY_CARE_PROVIDER_SITE_OTHER): Payer: Medicare Other | Admitting: Cardiology

## 2017-02-09 ENCOUNTER — Encounter: Payer: Self-pay | Admitting: Cardiology

## 2017-02-09 ENCOUNTER — Emergency Department (HOSPITAL_COMMUNITY): Payer: Medicare Other

## 2017-02-09 VITALS — BP 118/78 | HR 150 | Ht 68.0 in | Wt 242.0 lb

## 2017-02-09 DIAGNOSIS — E7800 Pure hypercholesterolemia, unspecified: Secondary | ICD-10-CM

## 2017-02-09 DIAGNOSIS — I272 Pulmonary hypertension, unspecified: Secondary | ICD-10-CM | POA: Diagnosis present

## 2017-02-09 DIAGNOSIS — K219 Gastro-esophageal reflux disease without esophagitis: Secondary | ICD-10-CM | POA: Diagnosis present

## 2017-02-09 DIAGNOSIS — E1159 Type 2 diabetes mellitus with other circulatory complications: Secondary | ICD-10-CM | POA: Diagnosis present

## 2017-02-09 DIAGNOSIS — E785 Hyperlipidemia, unspecified: Secondary | ICD-10-CM | POA: Diagnosis present

## 2017-02-09 DIAGNOSIS — I4821 Permanent atrial fibrillation: Secondary | ICD-10-CM | POA: Diagnosis present

## 2017-02-09 DIAGNOSIS — J449 Chronic obstructive pulmonary disease, unspecified: Secondary | ICD-10-CM | POA: Diagnosis present

## 2017-02-09 DIAGNOSIS — R079 Chest pain, unspecified: Secondary | ICD-10-CM | POA: Diagnosis not present

## 2017-02-09 DIAGNOSIS — J8410 Pulmonary fibrosis, unspecified: Secondary | ICD-10-CM | POA: Diagnosis present

## 2017-02-09 DIAGNOSIS — Z7952 Long term (current) use of systemic steroids: Secondary | ICD-10-CM

## 2017-02-09 DIAGNOSIS — I208 Other forms of angina pectoris: Secondary | ICD-10-CM | POA: Diagnosis not present

## 2017-02-09 DIAGNOSIS — I5031 Acute diastolic (congestive) heart failure: Secondary | ICD-10-CM | POA: Diagnosis not present

## 2017-02-09 DIAGNOSIS — K317 Polyp of stomach and duodenum: Secondary | ICD-10-CM | POA: Diagnosis present

## 2017-02-09 DIAGNOSIS — Z841 Family history of disorders of kidney and ureter: Secondary | ICD-10-CM

## 2017-02-09 DIAGNOSIS — I130 Hypertensive heart and chronic kidney disease with heart failure and stage 1 through stage 4 chronic kidney disease, or unspecified chronic kidney disease: Secondary | ICD-10-CM | POA: Diagnosis present

## 2017-02-09 DIAGNOSIS — Z79899 Other long term (current) drug therapy: Secondary | ICD-10-CM

## 2017-02-09 DIAGNOSIS — I10 Essential (primary) hypertension: Secondary | ICD-10-CM | POA: Diagnosis present

## 2017-02-09 DIAGNOSIS — I361 Nonrheumatic tricuspid (valve) insufficiency: Secondary | ICD-10-CM | POA: Diagnosis not present

## 2017-02-09 DIAGNOSIS — I5032 Chronic diastolic (congestive) heart failure: Secondary | ICD-10-CM | POA: Diagnosis present

## 2017-02-09 DIAGNOSIS — N390 Urinary tract infection, site not specified: Secondary | ICD-10-CM | POA: Diagnosis not present

## 2017-02-09 DIAGNOSIS — N179 Acute kidney failure, unspecified: Secondary | ICD-10-CM | POA: Diagnosis present

## 2017-02-09 DIAGNOSIS — R000 Tachycardia, unspecified: Secondary | ICD-10-CM | POA: Diagnosis not present

## 2017-02-09 DIAGNOSIS — N182 Chronic kidney disease, stage 2 (mild): Secondary | ICD-10-CM | POA: Diagnosis present

## 2017-02-09 DIAGNOSIS — D72829 Elevated white blood cell count, unspecified: Secondary | ICD-10-CM | POA: Diagnosis present

## 2017-02-09 DIAGNOSIS — I259 Chronic ischemic heart disease, unspecified: Secondary | ICD-10-CM | POA: Diagnosis not present

## 2017-02-09 DIAGNOSIS — E876 Hypokalemia: Secondary | ICD-10-CM

## 2017-02-09 DIAGNOSIS — Z801 Family history of malignant neoplasm of trachea, bronchus and lung: Secondary | ICD-10-CM

## 2017-02-09 DIAGNOSIS — E662 Morbid (severe) obesity with alveolar hypoventilation: Secondary | ICD-10-CM | POA: Diagnosis present

## 2017-02-09 DIAGNOSIS — E119 Type 2 diabetes mellitus without complications: Secondary | ICD-10-CM

## 2017-02-09 DIAGNOSIS — E1122 Type 2 diabetes mellitus with diabetic chronic kidney disease: Secondary | ICD-10-CM | POA: Diagnosis present

## 2017-02-09 DIAGNOSIS — J9611 Chronic respiratory failure with hypoxia: Secondary | ICD-10-CM | POA: Diagnosis not present

## 2017-02-09 DIAGNOSIS — D490 Neoplasm of unspecified behavior of digestive system: Secondary | ICD-10-CM | POA: Diagnosis not present

## 2017-02-09 DIAGNOSIS — Z6836 Body mass index (BMI) 36.0-36.9, adult: Secondary | ICD-10-CM | POA: Diagnosis not present

## 2017-02-09 DIAGNOSIS — Z792 Long term (current) use of antibiotics: Secondary | ICD-10-CM

## 2017-02-09 DIAGNOSIS — E1165 Type 2 diabetes mellitus with hyperglycemia: Secondary | ICD-10-CM | POA: Diagnosis not present

## 2017-02-09 DIAGNOSIS — E1142 Type 2 diabetes mellitus with diabetic polyneuropathy: Secondary | ICD-10-CM | POA: Diagnosis not present

## 2017-02-09 DIAGNOSIS — I152 Hypertension secondary to endocrine disorders: Secondary | ICD-10-CM | POA: Diagnosis present

## 2017-02-09 DIAGNOSIS — Z7901 Long term (current) use of anticoagulants: Secondary | ICD-10-CM | POA: Diagnosis not present

## 2017-02-09 DIAGNOSIS — I480 Paroxysmal atrial fibrillation: Secondary | ICD-10-CM | POA: Diagnosis not present

## 2017-02-09 DIAGNOSIS — H3530 Unspecified macular degeneration: Secondary | ICD-10-CM | POA: Diagnosis present

## 2017-02-09 DIAGNOSIS — I4891 Unspecified atrial fibrillation: Secondary | ICD-10-CM | POA: Diagnosis present

## 2017-02-09 DIAGNOSIS — Z87440 Personal history of urinary (tract) infections: Secondary | ICD-10-CM | POA: Diagnosis not present

## 2017-02-09 DIAGNOSIS — I481 Persistent atrial fibrillation: Secondary | ICD-10-CM | POA: Diagnosis not present

## 2017-02-09 DIAGNOSIS — M069 Rheumatoid arthritis, unspecified: Secondary | ICD-10-CM | POA: Diagnosis present

## 2017-02-09 DIAGNOSIS — Z79890 Hormone replacement therapy (postmenopausal): Secondary | ICD-10-CM | POA: Diagnosis not present

## 2017-02-09 DIAGNOSIS — E039 Hypothyroidism, unspecified: Secondary | ICD-10-CM | POA: Diagnosis not present

## 2017-02-09 DIAGNOSIS — Z9981 Dependence on supplemental oxygen: Secondary | ICD-10-CM

## 2017-02-09 DIAGNOSIS — N183 Chronic kidney disease, stage 3 (moderate): Secondary | ICD-10-CM | POA: Diagnosis not present

## 2017-02-09 DIAGNOSIS — E038 Other specified hypothyroidism: Secondary | ICD-10-CM

## 2017-02-09 DIAGNOSIS — D72828 Other elevated white blood cell count: Secondary | ICD-10-CM | POA: Diagnosis not present

## 2017-02-09 DIAGNOSIS — E1169 Type 2 diabetes mellitus with other specified complication: Secondary | ICD-10-CM | POA: Diagnosis present

## 2017-02-09 DIAGNOSIS — I495 Sick sinus syndrome: Secondary | ICD-10-CM | POA: Diagnosis present

## 2017-02-09 DIAGNOSIS — G4733 Obstructive sleep apnea (adult) (pediatric): Secondary | ICD-10-CM | POA: Diagnosis not present

## 2017-02-09 LAB — CBC WITH DIFFERENTIAL/PLATELET
Basophils Absolute: 0 10*3/uL (ref 0.0–0.1)
Basophils Relative: 0 %
Eosinophils Absolute: 0.1 10*3/uL (ref 0.0–0.7)
Eosinophils Relative: 1 %
HCT: 44 % (ref 36.0–46.0)
Hemoglobin: 14 g/dL (ref 12.0–15.0)
Lymphocytes Relative: 9 %
Lymphs Abs: 1.5 10*3/uL (ref 0.7–4.0)
MCH: 29.4 pg (ref 26.0–34.0)
MCHC: 31.8 g/dL (ref 30.0–36.0)
MCV: 92.4 fL (ref 78.0–100.0)
Monocytes Absolute: 1.1 10*3/uL — ABNORMAL HIGH (ref 0.1–1.0)
Monocytes Relative: 6 %
Neutro Abs: 15.2 10*3/uL — ABNORMAL HIGH (ref 1.7–7.7)
Neutrophils Relative %: 84 %
Platelets: 301 10*3/uL (ref 150–400)
RBC: 4.76 MIL/uL (ref 3.87–5.11)
RDW: 15.3 % (ref 11.5–15.5)
WBC: 18 10*3/uL — ABNORMAL HIGH (ref 4.0–10.5)

## 2017-02-09 LAB — BASIC METABOLIC PANEL
Anion gap: 13 (ref 5–15)
BUN: 19 mg/dL (ref 6–20)
CO2: 35 mmol/L — ABNORMAL HIGH (ref 22–32)
Calcium: 10.2 mg/dL (ref 8.9–10.3)
Chloride: 94 mmol/L — ABNORMAL LOW (ref 101–111)
Creatinine, Ser: 1.26 mg/dL — ABNORMAL HIGH (ref 0.44–1.00)
GFR calc Af Amer: 47 mL/min — ABNORMAL LOW (ref >60)
GFR calc non Af Amer: 40 mL/min — ABNORMAL LOW (ref >60)
Glucose, Bld: 185 mg/dL — ABNORMAL HIGH (ref 65–99)
Potassium: 3.6 mmol/L (ref 3.5–5.1)
Sodium: 142 mmol/L (ref 135–145)

## 2017-02-09 LAB — TROPONIN I
Troponin I: 0.04 ng/mL (ref <0.03)
Troponin I: 0.03 ng/mL (ref <0.03)

## 2017-02-09 LAB — MRSA PCR SCREENING: MRSA by PCR: NEGATIVE

## 2017-02-09 LAB — MAGNESIUM: Magnesium: 1.9 mg/dL (ref 1.7–2.4)

## 2017-02-09 MED ORDER — APIXABAN 5 MG PO TABS
5.0000 mg | ORAL_TABLET | Freq: Two times a day (BID) | ORAL | Status: DC
  Administered 2017-02-09 – 2017-02-16 (×14): 5 mg via ORAL
  Filled 2017-02-09 (×14): qty 1

## 2017-02-09 MED ORDER — ACETAMINOPHEN 650 MG RE SUPP: 650.0000 mg | Freq: Four times a day (QID) | RECTAL | Status: DC | PRN

## 2017-02-09 MED ORDER — FLECAINIDE ACETATE 100 MG PO TABS
ORAL_TABLET | ORAL | Status: AC
  Filled 2017-02-09: qty 1

## 2017-02-09 MED ORDER — POLYETHYLENE GLYCOL 3350 17 G PO PACK: 17.0000 g | PACK | Freq: Every day | ORAL | Status: DC | PRN

## 2017-02-09 MED ORDER — ROSUVASTATIN CALCIUM 10 MG PO TABS
10.0000 mg | ORAL_TABLET | Freq: Every day | ORAL | Status: DC
  Administered 2017-02-09 – 2017-02-15 (×7): 10 mg via ORAL
  Filled 2017-02-09 (×7): qty 1

## 2017-02-09 MED ORDER — FUROSEMIDE 40 MG PO TABS: ORAL_TABLET | ORAL | 6 refills | Status: DC

## 2017-02-09 MED ORDER — ONDANSETRON HCL 4 MG/2ML IJ SOLN
4.0000 mg | Freq: Four times a day (QID) | INTRAMUSCULAR | Status: DC | PRN
Start: 2017-02-09 — End: 2017-02-16

## 2017-02-09 MED ORDER — DILTIAZEM LOAD VIA INFUSION
15.0000 mg | Freq: Once | INTRAVENOUS | Status: AC
  Administered 2017-02-09: 15 mg via INTRAVENOUS
  Filled 2017-02-09: qty 15

## 2017-02-09 MED ORDER — ACETAMINOPHEN 325 MG PO TABS
650.0000 mg | ORAL_TABLET | Freq: Four times a day (QID) | ORAL | Status: DC | PRN
Start: 2017-02-09 — End: 2017-02-16
  Administered 2017-02-13: 650 mg via ORAL
  Filled 2017-02-09: qty 2

## 2017-02-09 MED ORDER — ONDANSETRON HCL 4 MG PO TABS: 4.0000 mg | ORAL_TABLET | Freq: Four times a day (QID) | ORAL | Status: DC | PRN

## 2017-02-09 MED ORDER — DILTIAZEM HCL-DEXTROSE 100-5 MG/100ML-% IV SOLN (PREMIX)
5.0000 mg/h | INTRAVENOUS | Status: DC
  Administered 2017-02-09: 5 mg/h via INTRAVENOUS
  Administered 2017-02-09 – 2017-02-12 (×10): 15 mg/h via INTRAVENOUS
  Filled 2017-02-09 (×11): qty 100

## 2017-02-09 MED ORDER — FUROSEMIDE 10 MG/ML IJ SOLN: 20.0000 mg | Freq: Every day | INTRAMUSCULAR | Status: DC

## 2017-02-09 MED ORDER — METOPROLOL SUCCINATE ER 100 MG PO TB24: 100.0000 mg | ORAL_TABLET | Freq: Every day | ORAL | 6 refills | Status: DC

## 2017-02-09 MED ORDER — LEVOTHYROXINE SODIUM 100 MCG PO TABS
100.0000 ug | ORAL_TABLET | Freq: Every day | ORAL | Status: DC
  Administered 2017-02-10 – 2017-02-16 (×7): 100 ug via ORAL
  Filled 2017-02-09 (×7): qty 1

## 2017-02-09 MED ORDER — FUROSEMIDE 10 MG/ML IJ SOLN: 40.0000 mg | Freq: Every day | INTRAMUSCULAR | Status: DC

## 2017-02-09 MED ORDER — FLECAINIDE ACETATE 50 MG PO TABS
50.0000 mg | ORAL_TABLET | Freq: Two times a day (BID) | ORAL | Status: DC
  Administered 2017-02-09 – 2017-02-10 (×2): 50 mg via ORAL
  Filled 2017-02-09 (×4): qty 1

## 2017-02-09 MED ORDER — TRIMETHOPRIM 100 MG PO TABS
100.0000 mg | ORAL_TABLET | Freq: Every day | ORAL | Status: DC
  Administered 2017-02-10 – 2017-02-11 (×2): 100 mg via ORAL
  Filled 2017-02-09 (×3): qty 1

## 2017-02-09 NOTE — Patient Instructions (Signed)
Medication Instructions:  Your physician has recommended you make the following change in your medication:  Start Toprol 100 mg Daily  Lasix 40 mg Daily Alternating with 60 mg Daily    Labwork: Your physician recommends that you return for lab work in: Today    Testing/Procedures: NONE  Follow-Up:   Any Other Special Instructions Will Be Listed Below (If Applicable).     If you need a refill on your cardiac medications before your next appointment, please call your pharmacy.

## 2017-02-09 NOTE — ED Triage Notes (Signed)
Weakness since Saturday. CP above lt breast on and off for a few days.

## 2017-02-09 NOTE — H&P (Signed)
History and Physical    Lorraine Leonard ZOX:[id number] DOB: 07/16/1940 DOA: 02/09/2017  PCP: Sharion Balloon, FNP   Patient coming from: [facility]  Chief Complaint: palpitations  HPI: Lorraine Leonard is a 76 y.o. female with medical history significant of atrial fibrillation, GERD, hypothyroidism, essential HTN, pulmonary HTN, DM, Pulmonary fibrosis, CKD Stage 2 who was sent to the ED from [facility] after being found to be in atrial fibrillation with rapid ventricular response.  4 days prior to admission patient was at the heart clinic to get weighed and get an EKG.  She says her weight was up 12 lbs from the past two weeks and her heart rate was in the 40s.  She was told to stop her Toprol but continue her diltiazem.  She was to follow up today at the cardiology clinic.  At follow up today she had palpitations (which started yesterday) and fatigue.  Weight decreased from 253 to 242 over the past four days.  Today patient reports she feels very weak.  No fevers, chills.  Patient reports dyspnea, and minimal chest pain (wears 3L [place] at baseline). No dysuria or hematuria.  No diarrhea or constipation.   ED Course: Patient was seen and examined.  Found to be in A fib with RVR- started on diltiazem drip.  WBC elevated at 18k.  Hypertensive.  Slightly elevated troponin at 0.04.  TRH asked to admit.   Review of Systems: As per HPI otherwise 10 point review of systems negative.    Past Medical History:  Diagnosis Date  . Acute diastolic CHF (congestive heart failure) ([place]) 11/11/2016  . Allergic rhinitis   . Anxiety   . Anxiety   . Arthritis   . Atrial fibrillation ([place]) 09/2016  . COPD (chronic obstructive pulmonary disease) ([place])   . Depression   . Diabetes mellitus    pre-diabetic  . Diverticulosis   . Dyspnea   . Dysrhythmia   . GERD (gastroesophageal reflux disease)   . Gout   . Hyperlipidemia   . Hypertension   . Hypothyroidism   . Lung fibrosis ([place])   . Macular  degeneration   . Neuropathy   . Pulmonary fibrosis ([place])   . Rheumatoid arthritis(714.0)   . Sleep apnea    uses 3 liters of o2 24/7  . Urticaria     Past Surgical History:  Procedure Laterality Date  . CARDIOVERSION N/A 01/30/2017   Procedure: CARDIOVERSION;  Surgeon: Josue Hector, MD;  [facility];  Service: Cardiovascular;  Laterality: N/A;  . CATARACT EXTRACTION, BILATERAL Bilateral   . EUS N/A 10/03/2015   Procedure: UPPER ENDOSCOPIC ULTRASOUND (EUS) RADIAL;  Surgeon: Arta Silence, MD;  [facility];  Service: Endoscopy;  Laterality: N/A;  . FINE NEEDLE ASPIRATION N/A 10/03/2015   Procedure: FINE NEEDLE ASPIRATION (FNA) RADIAL;  Surgeon: Arta Silence, MD;  [facility];  Service: Endoscopy;  Laterality: N/A;  . 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 LUMBAR DISC SURGERY     x 2  . TUBAL LIGATION    . VAGINAL HYSTERECTOMY       reports that she has never smoked. She has never used smokeless tobacco. She reports that she does not drink alcohol or use drugs.  Allergies  Allergen Reactions  . Allopurinol Swelling    Feet and legs swell  . Mobic [Meloxicam] Swelling and Other (See Comments)    Feet and Leg swelling  Family History  Problem Relation Age of Onset  . Diabetes Unknown   . Hypertension Unknown   . Coronary artery disease Unknown   . Stroke Unknown   . Asthma Unknown   . Kidney disease Sister   . Lung cancer Sister   . Lung cancer Sister   . Suicidality Son        committed suicide in 2009  . Hyperlipidemia Unknown   . Anxiety disorder Unknown   . Migraines Unknown      Prior to Admission medications   Medication Sig Start Date End Date Taking? Authorizing Provider  albuterol (PROVENTIL HFA;VENTOLIN HFA) 108 (90 Base) MCG/ACT inhaler Inhale 2 puffs into the lungs every 6 (six) hours as needed for wheezing or shortness of breath. 09/25/16  Yes Tat, Shanon Brow, MD  azelastine (ASTELIN) 0.1 % nasal spray PLACE  2 SPRAYS INTO BOTH NOSTRILS 2 (TWO) TIMES DAILY AS NEEDED. 01/14/17  Yes Hawks, Christy A, FNP  calcium citrate (CALCITRATE - DOSED IN MG ELEMENTAL CALCIUM) 950 MG tablet Take 1 tablet by mouth 2 (two) times daily.    Yes [provider]  Cholecalciferol 2000 units CAPS Take 2,000 Units by mouth daily.    Yes [provider]  conjugated estrogens (PREMARIN) vaginal cream Place 0.5 g vaginally every Thursday. Use as directed   Yes [provider]  cyanocobalamin 500 MCG tablet Take 500 mcg by mouth daily.    Yes [provider]  diazepam (VALIUM) 5 MG tablet TAKE 1 TABLET TWICE A DAY AS NEEDED FOR ANXIETY Patient taking differently: Take 5 mg by mouth 2 (two) times daily as needed for anxiety.  07/17/16  Yes Hawks, Christy A, FNP  diltiazem (CARDIZEM CD) 180 MG 24 hr capsule TAKE 1 CAPSULE BY MOUTH EVERY DAY 01/02/17  Yes Hawks, Christy A, FNP  docusate sodium (COLACE) 100 MG capsule Take 200 mg by mouth daily as needed for mild constipation.   Yes [provider]  ELIQUIS 5 MG TABS tablet TAKE 1 TABLET BY MOUTH TWICE A DAY 01/23/17  Yes Hawks, Christy A, FNP  EPINEPHrine 0.3 mg/0.3 mL IJ SOAJ injection Inject 0.3 mg into the muscle once as needed (For anaphylaxis.).   Yes [provider]  esomeprazole (NEXIUM) 40 MG capsule Take 40 mg by mouth daily.  01/07/17  Yes [provider]  ferrous sulfate 325 (65 FE) MG tablet Take 325 mg by mouth 2 (two) times daily with a meal.   Yes [provider]  fluticasone (FLONASE) 50 MCG/ACT nasal spray PLACE 1 SPRAY INTO BOTH NOSTRILS 2 (TWO) TIMES DAILY AS NEEDED FOR ALLERGIES OR RHINITIS. 01/05/17  Yes Hawks, Christy A, FNP  furosemide (LASIX) 40 MG tablet Take 40 mg ( 1 Tablet) Alternating with 60 mg ( 1 1/2 Tablet) Daily 02/09/17  Yes Branch, Alphonse Guild, MD  INCRUSE ELLIPTA 62.5 MCG/INH AEPB TAKE 1 PUFF BY MOUTH EVERY DAY 10/31/16  Yes [provider]  levocetirizine (XYZAL) 5 MG tablet  Take 5 mg by mouth every evening.  07/16/15  Yes [provider]  levothyroxine (SYNTHROID, LEVOTHROID) 100 MCG tablet TAKE 1 TABLET (100 MCG TOTAL) BY MOUTH DAILY BEFORE BREAKFAST. 01/20/17  Yes Hawks, Christy A, FNP  metoprolol succinate (TOPROL-XL) 100 MG 24 hr tablet Take 1 tablet (100 mg total) by mouth daily. 02/09/17  Yes Arnoldo Lenis, MD  mometasone (ELOCON) 0.1 % cream APPLY TO AFFECTED AREA DAILY 08/12/16  Yes Sharion Balloon, FNP  Multiple Vitamins-Minerals (PRESERVISION AREDS 2  PO) Take 1 tablet by mouth 2 (two) times daily.   Yes [provider]  ondansetron (ZOFRAN) 8 MG tablet Take 8 mg by mouth every 8 (eight) hours as needed for nausea or vomiting.   Yes [provider]  OVER THE COUNTER MEDICATION Place 1 drop into both eyes as needed (For dry eyes.). Systane Gel Drops   Yes [provider]  PATADAY 0.2 % SOLN Place 1 drop into both eyes daily. 06/10/12  Yes [provider]  polyethylene glycol powder (GLYCOLAX/MIRALAX) powder TAKE 17 G BY MOUTH 2 (TWO) TIMES DAILY AS NEEDED. 01/19/17  Yes Hawks, Christy A, FNP  predniSONE (DELTASONE) 10 MG tablet Take 10 mg by mouth daily with breakfast.   Yes [provider]  rosuvastatin (CRESTOR) 10 MG tablet TAKE 1 TABLET BY MOUTH AT BEDTIME 02/02/17  Yes Hawks, Christy A, FNP  traMADol (ULTRAM) 50 MG tablet TAKE 1 TABLET BY MOUTH EVERY 6 HOURS AS NEEDED Patient taking differently: TAKE 1 TABLET BY MOUTH EVERY 6 HOURS AS NEEDED FOR PAIN 12/03/15  Yes Hawks, Christy A, FNP  trimethoprim (TRIMPEX) 100 MG tablet Take 100 mg by mouth daily.   Yes [provider]  VIGAMOX 0.5 % ophthalmic solution Apply 1 drop to eye as needed (after eye injection).  08/30/15  Yes [provider]  vitamin C (ASCORBIC ACID) 500 MG tablet Take 500 mg by mouth daily.   Yes [provider]  Vitamin D, Ergocalciferol, (DRISDOL) 50000 units CAPS capsule TAKE 1 CAPSULE (50,000 UNITS TOTAL) BY  MOUTH EVERY 7 (SEVEN) DAYS. Patient taking differently: Take 50,000 Units by mouth every 7 (seven) days. Take on Wednesday 11/21/16  Yes Hawks, Alyse Low A, FNP  vitamin E 400 UNIT capsule Take 400 Units by mouth daily.   Yes [provider]    Physical Exam: Vitals:   02/09/17 1342 02/09/17 1400 02/09/17 1430 02/09/17 1503  BP: 119/72 (!) 140/102 (!) 172/144 (!) 147/66  Pulse: (!) 142 (!) 153  (!) 150  Resp: (!) 22 (!) 26 18 20   Temp:      TempSrc:      SpO2: 100% 96% 95% 94%  Weight:      Height:          Constitutional: NAD, calm, comfortable Vitals:   02/09/17 1342 02/09/17 1400 02/09/17 1430 02/09/17 1503  BP: 119/72 (!) 140/102 (!) 172/144 (!) 147/66  Pulse: (!) 142 (!) 153  (!) 150  Resp: (!) 22 (!) 26 18 20   Temp:      TempSrc:      SpO2: 100% 96% 95% 94%  Weight:      Height:       Eyes: PERRL, lids and conjunctivae normal, injected conjunctiva ENMT: Mucous membranes are moist. Posterior pharynx clear of any exudate or lesions.Normal dentition.  Neck: normal, supple, no masses, no thyromegaly Respiratory: nasal cannula in place, able to speak in phrases but struggles to speak in sentences, scattered rhonchi, no accessory muscle use, increased work of breathing  Cardiovascular: irregularly irregular rhythm, no murmurs / rubs / gallops. Minimal right foot edema. 2+ pedal pulses. No carotid bruits.  Abdomen: obese, no tenderness, no masses palpated. No hepatosplenomegaly. Bowel sounds positive.  Musculoskeletal: no clubbing / cyanosis. No joint deformity upper and lower extremities. Good ROM, no contractures. Normal muscle tone.  Skin: no rashes, lesions, ulcers. No induration Neurologic: CN 2-12 grossly intact. Sensation intact, DTR normal. Strength 5/5 in all 4.  Psychiatric: Normal judgment and insight. Alert  and oriented x 3. Normal mood.    Labs on Admission: I have personally reviewed following labs and imaging studies  CBC:  Recent Labs Lab  02/09/17 1327  WBC 18.0*  NEUTROABS 15.2*  HGB 14.0  HCT 44.0  MCV 92.4  PLT 956   Basic Metabolic Panel:  Recent Labs Lab 02/09/17 1327  NA 142  K 3.6  CL 94*  CO2 35*  GLUCOSE 185*  BUN 19  CREATININE 1.26*  CALCIUM 10.2  MG 1.9   GFR: Estimated Creatinine Clearance: 49.4 mL/min (A) (by C-G formula based on SCr of 1.26 mg/dL (H)). Liver Function Tests: No results for input(s): AST, ALT, ALKPHOS, BILITOT, PROT, ALBUMIN in the last 168 hours. No results for input(s): LIPASE, AMYLASE in the last 168 hours. No results for input(s): AMMONIA in the last 168 hours. Coagulation Profile: No results for input(s): INR, PROTIME in the last 168 hours. Cardiac Enzymes:  Recent Labs Lab 02/09/17 1327  TROPONINI 0.04*   BNP (last 3 results) No results for input(s): PROBNP in the last 8760 hours. HbA1C: No results for input(s): HGBA1C in the last 72 hours. CBG: No results for input(s): GLUCAP in the last 168 hours. Lipid Profile: No results for input(s): CHOL, HDL, LDLCALC, TRIG, CHOLHDL, LDLDIRECT in the last 72 hours. Thyroid Function Tests: No results for input(s): TSH, T4TOTAL, FREET4, T3FREE, THYROIDAB in the last 72 hours. Anemia Panel: No results for input(s): VITAMINB12, FOLATE, FERRITIN, TIBC, IRON, RETICCTPCT in the last 72 hours. Urine analysis:    Component Value Date/Time   COLORURINE COLORLESS (A) 11/10/2016 1748   APPEARANCEUR CLEAR 11/10/2016 1748   LABSPEC 1.004 (L) 11/10/2016 1748   PHURINE 7.0 11/10/2016 1748   GLUCOSEU NEGATIVE 11/10/2016 1748   HGBUR NEGATIVE 11/10/2016 1748   BILIRUBINUR NEGATIVE 11/10/2016 1748   BILIRUBINUR neg 04/06/2015 1517   KETONESUR NEGATIVE 11/10/2016 1748   PROTEINUR NEGATIVE 11/10/2016 1748   UROBILINOGEN negative 04/06/2015 1517   UROBILINOGEN 0.2 05/14/2011 1942   NITRITE NEGATIVE 11/10/2016 1748   LEUKOCYTESUR NEGATIVE 11/10/2016 1748   Sepsis Labs:  !!!!!!!!!!!!!!!!!!!!!!!!!!!!!!!!!!!!!!!!!!!! @LABRCNTIP (procalcitonin:4,lacticidven:4) )No results found for this or any previous visit (from the past 240 hour(s)).   Radiological Exams on Admission: Dg Chest Portable 1 View  Result Date: 02/09/2017 CLINICAL DATA:  Chest pain and weakness since Saturday. EXAM: PORTABLE CHEST 1 VIEW COMPARISON:  11/10/2016 FINDINGS: The heart is enlarged but stable. Stable tortuosity and calcification of the thoracic aorta. Chronic bronchitic type interstitial lung changes but no obvious acute overlying pulmonary process. Low lung volumes with vascular crowding and streaky atelectasis. The bony thorax is intact. IMPRESSION: Stable cardiac enlargement and tortuous calcified thoracic aorta. Low lung volumes with vascular crowding and streaky atelectasis. Chronic bronchitic type interstitial changes. Electronically Signed   By: Marijo Sanes M.D.   On: 02/09/2017 13:28    EKG: Independently reviewed. Atrial fibrillation with rapid ventricular rate  Assessment/Plan Principal Problem:   Atrial fibrillation (HCC) Active Problems:   Essential hypertension   GASTROESOPHAGEAL REFLUX DISEASE   Hypothyroidism   Hyperlipemia   Diabetes mellitus (HCC)   Pulmonary fibrosis (HCC)   CKD (chronic kidney disease) stage 2, GFR 60-89 ml/min     Atrial fibrillation - patient on diltiazem drip - flecanide 50mg  BID  - cardiology consulted - admit to SDU inpatient - recent cardioversion - continue eliquis  Leukocytosis - no evidence of infection on exam - will order UA - monitor for signs of infection - patient on chronic prednisone at baseline but with  increased neutrophils have suspicion for infectious etiology - CXR does not show evidence of pneumonia  Hypothyroidism - continue synthroid  HLD - continue daily statin  DM - does not appear to be on any medication outpatient - will order HgA1c  Pulmonary fibrosis - follows with Dr. Lamonte Sakai outpatient -  continue chronic prednisone  CKD Stage 2 - baseline creatinine appears to be around 0.9 - slightly elevated today - repeat BMP in am   DVT prophylaxis: eliquis Code Status:  Full code Family Communication:  Husband bedside Disposition Plan: pending improvement in heart rate control  Consults called: cardiology  Admission status: inpatient, SDU   Loretha Stapler MD [facility] Pager [phone]  If 7PM-7AM, please contact night-coverage [email or web address] Password [email or web address]  02/09/2017, 3:35 PM

## 2017-02-09 NOTE — Progress Notes (Signed)
Message sent to Mid-level via AMION to alert, as per the order, that the pt's HR is >130. -patient is in no apparent distress with RR and effort WDP -patient does deny at this time experiencing any CP or SOB

## 2017-02-09 NOTE — Progress Notes (Signed)
Clinical Summary Lorraine Leonard is a 76 y.o.female seen today for follow up of the following medical problems.   1. Bradycardia/Afib - cardioversion 01/30/17 for afib -  Since that time significant fatigue, SOB/DOE, and weight gain - she has lowered the dose of her Toprol to 100mg  daily on her own without imrpvoment of symptoms  - last visit we stopped her Toprol due to bradycardia in the 40s - recent palpitations started yesterday. Fatigue along with SOB.   2. Weight gain/ Acute on chronic diastolic Hf - no recent edema, +abdominal distension.  - by our scale up 12 lbs over the last 2 weeks  - last visit we increased lasix to 60mg  daily. Weight is down from 253 to 242 lbs over the weekend   3. Pulmonary fibrosis - followed by pulmionary   4. FOBT positive - followed by GI   Past Medical History:  Diagnosis Date  . Acute diastolic CHF (congestive heart failure) ([place]) 11/11/2016  . Allergic rhinitis   . Anxiety   . Anxiety   . Arthritis   . Atrial fibrillation ([place]) 09/2016  . COPD (chronic obstructive pulmonary disease) ([place])   . Depression   . Diabetes mellitus    pre-diabetic  . Diverticulosis   . Dyspnea   . Dysrhythmia   . GERD (gastroesophageal reflux disease)   . Gout   . Hyperlipidemia   . Hypertension   . Hypothyroidism   . Lung fibrosis ([place])   . Macular degeneration   . Neuropathy   . Pulmonary fibrosis ([place])   . Rheumatoid arthritis(714.0)   . Sleep apnea    uses 3 liters of o2 24/7  . Urticaria      Allergies  Allergen Reactions  . Allopurinol Swelling    Feet and legs swell  . Mobic [Meloxicam] Swelling and Other (See Comments)    Feet and Leg swelling     Current Outpatient Prescriptions  Medication Sig Dispense Refill  . albuterol (PROVENTIL HFA;VENTOLIN HFA) 108 (90 Base) MCG/ACT inhaler Inhale 2 puffs into the lungs every 6 (six) hours as needed for wheezing or shortness of breath. 1 Inhaler 2  . azelastine (ASTELIN) 0.1 %  nasal spray PLACE 2 SPRAYS INTO BOTH NOSTRILS 2 (TWO) TIMES DAILY AS NEEDED. 30 mL 5  . calcium citrate (CALCITRATE - DOSED IN MG ELEMENTAL CALCIUM) 950 MG tablet Take 1 tablet by mouth 2 (two) times daily.     . Cholecalciferol 2000 units CAPS Take 2,000 Units by mouth daily.     Marland Kitchen conjugated estrogens (PREMARIN) vaginal cream Place 0.5 g vaginally every Thursday. Use as directed    . cyanocobalamin 500 MCG tablet Take 500 mcg by mouth daily.     . diazepam (VALIUM) 5 MG tablet TAKE 1 TABLET TWICE A DAY AS NEEDED FOR ANXIETY (Patient taking differently: Take 5 mg by mouth 2 (two) times daily as needed for anxiety. ) 60 tablet 5  . diltiazem (CARDIZEM CD) 180 MG 24 hr capsule TAKE 1 CAPSULE BY MOUTH EVERY DAY 30 capsule 5  . docusate sodium (COLACE) 100 MG capsule Take 200 mg by mouth daily as needed for mild constipation.    Marland Kitchen ELIQUIS 5 MG TABS tablet TAKE 1 TABLET BY MOUTH TWICE A DAY 60 tablet 1  . EPINEPHrine 0.3 mg/0.3 mL IJ SOAJ injection Inject 0.3 mg into the muscle once as needed (For anaphylaxis.).    Marland Kitchen esomeprazole (NEXIUM) 40 MG capsule Take 40 mg by mouth daily.     Marland Kitchen  ferrous sulfate 325 (65 FE) MG tablet Take 325 mg by mouth 2 (two) times daily with a meal.    . fluticasone (FLONASE) 50 MCG/ACT nasal spray PLACE 1 SPRAY INTO BOTH NOSTRILS 2 (TWO) TIMES DAILY AS NEEDED FOR ALLERGIES OR RHINITIS. 16 g 2  . furosemide (LASIX) 40 MG tablet Take 1.5 tablets (60 mg total) by mouth daily. 90 tablet 3  . INCRUSE ELLIPTA 62.5 MCG/INH AEPB TAKE 1 PUFF BY MOUTH EVERY DAY  3  . levocetirizine (XYZAL) 5 MG tablet Take 5 mg by mouth every evening.     Marland Kitchen levothyroxine (SYNTHROID, LEVOTHROID) 100 MCG tablet TAKE 1 TABLET (100 MCG TOTAL) BY MOUTH DAILY BEFORE BREAKFAST. 90 tablet 1  . mometasone (ELOCON) 0.1 % cream APPLY TO AFFECTED AREA DAILY 45 g 2  . Multiple Vitamins-Minerals (PRESERVISION AREDS 2 PO) Take 1 tablet by mouth 2 (two) times daily.    . ondansetron (ZOFRAN) 8 MG tablet Take 8 mg by  mouth every 8 (eight) hours as needed for nausea or vomiting.    Marland Kitchen OVER THE COUNTER MEDICATION Place 1 drop into both eyes as needed (For dry eyes.). Systane Gel Drops    . PATADAY 0.2 % SOLN Place 1 drop into both eyes daily.    . polyethylene glycol powder (GLYCOLAX/MIRALAX) powder TAKE 17 G BY MOUTH 2 (TWO) TIMES DAILY AS NEEDED. 3162 g 0  . predniSONE (DELTASONE) 10 MG tablet Take 10 mg by mouth daily with breakfast.    . rosuvastatin (CRESTOR) 10 MG tablet TAKE 1 TABLET BY MOUTH AT BEDTIME 30 tablet 0  . traMADol (ULTRAM) 50 MG tablet TAKE 1 TABLET BY MOUTH EVERY 6 HOURS AS NEEDED (Patient taking differently: TAKE 1 TABLET BY MOUTH EVERY 6 HOURS AS NEEDED FOR PAIN) 90 tablet 2  . trimethoprim (TRIMPEX) 100 MG tablet Take 100 mg by mouth daily.    Marland Kitchen VIGAMOX 0.5 % ophthalmic solution Apply 1 drop to eye as needed (after eye injection).   12  . vitamin C (ASCORBIC ACID) 500 MG tablet Take 500 mg by mouth daily.    . Vitamin D, Ergocalciferol, (DRISDOL) 50000 units CAPS capsule TAKE 1 CAPSULE (50,000 UNITS TOTAL) BY MOUTH EVERY 7 (SEVEN) DAYS. (Patient taking differently: Take 50,000 Units by mouth every 7 (seven) days. Take on Wednesday) 12 capsule 0  . vitamin E 400 UNIT capsule Take 400 Units by mouth daily.     No current facility-administered medications for this visit.      Past Surgical History:  Procedure Laterality Date  . CARDIOVERSION N/A 01/30/2017   Procedure: CARDIOVERSION;  Surgeon: Josue Hector, MD;  [facility];  Service: Cardiovascular;  Laterality: N/A;  . CATARACT EXTRACTION, BILATERAL Bilateral   . EUS N/A 10/03/2015   Procedure: UPPER ENDOSCOPIC ULTRASOUND (EUS) RADIAL;  Surgeon: Arta Silence, MD;  Location: [facility];  Service: Endoscopy;  Laterality: N/A;  . FINE NEEDLE ASPIRATION N/A 10/03/2015   Procedure: FINE NEEDLE ASPIRATION (FNA) RADIAL;  Surgeon: Arta Silence, MD;  Location: [facility];  Service: Endoscopy;  Laterality: N/A;  . 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 LUMBAR DISC SURGERY     x 2  . TUBAL LIGATION    . VAGINAL HYSTERECTOMY       Allergies  Allergen Reactions  . Allopurinol Swelling    Feet and legs swell  . Mobic [Meloxicam] Swelling and Other (See Comments)    Feet and Leg swelling  Family History  Problem Relation Age of Onset  . Diabetes Unknown   . Hypertension Unknown   . Coronary artery disease Unknown   . Stroke Unknown   . Asthma Unknown   . Kidney disease Sister   . Lung cancer Sister   . Lung cancer Sister   . Suicidality Son        committed suicide in 2009  . Hyperlipidemia Unknown   . Anxiety disorder Unknown   . Migraines Unknown      Social History Ms. Youngren reports that she has never smoked. She has never used smokeless tobacco. Ms. Jorden reports that she does not drink alcohol.   Review of Systems CONSTITUTIONAL: per hpi HEENT: Eyes: No visual loss, blurred vision, double vision or yellow sclerae.No hearing loss, sneezing, congestion, runny nose or sore throat.  SKIN: No rash or itching.  CARDIOVASCULAR: per hpi RESPIRATORY: per hpi GASTROINTESTINAL: No anorexia, nausea, vomiting or diarrhea. No abdominal pain or blood.  GENITOURINARY: No burning on urination, no polyuria NEUROLOGICAL: No headache, dizziness, syncope, paralysis, ataxia, numbness or tingling in the extremities. No change in bowel or bladder control.  MUSCULOSKELETAL: No muscle, back pain, joint pain or stiffness.  LYMPHATICS: No enlarged nodes. No history of splenectomy.  PSYCHIATRIC: No history of depression or anxiety.  ENDOCRINOLOGIC: No reports of sweating, cold or heat intolerance. No polyuria or polydipsia.  Marland Kitchen   Physical Examination Vitals:   02/09/17 1139  BP: 118/78  Pulse: 61  SpO2: 92%   Filed Weights   02/09/17 1139  Weight: 242 lb (109.8 kg)    Gen: resting comfortably, no acute distress HEENT: no scleral icterus, pupils equal round and reactive, no  palptable cervical adenopathy,  CV: RRR, no m/r/g, no jvd Resp: Clear to auscultation bilaterally GI: abdomen is soft, non-tender, non-distended, normal bowel sounds, no hepatosplenomegaly MSK: extremities are warm, no edema.  Skin: warm, no rash Neuro:  no focal deficits Psych: appropriate affect   Diagnostic Studies 11/2015 echo Study Conclusions  - Left ventricle: The cavity size was normal. There was severe   focal basal hypertrophy of the septum. Systolic function was   vigorous. The estimated ejection fraction was in the range of 65%   to 70%. Wall motion was normal; there were no regional wall   motion abnormalities. - Mitral valve: Moderately calcified annulus. There was mild   regurgitation. - Left atrium: Volume/bsa, ES (1-plane Simpson&'s, A4C): 26.5   ml/m^2. - Right ventricle: The cavity size was normal. Wall thickness was   mildly increased. - Pulmonary arteries: Systolic pressure was within the normal   range.   09/2016 echo Study Conclusions  - Left ventricle: The cavity size was normal. Wall thickness was   increased in a pattern of mild LVH. Systolic function was normal.   The estimated ejection fraction was in the range of 60% to 65%.   Wall motion was normal; there were no regional wall motion   abnormalities. Features are consistent with a pseudonormal left   ventricular filling pattern, with concomitant abnormal relaxation   and increased filling pressure (grade 2 diastolic dysfunction). - Aortic valve: Mildly calcified annulus. Trileaflet. - Mitral valve: Calcified annulus. - Right atrium: Central venous pressure (est): 3 mm Hg. - Atrial septum: No defect or patent foramen ovale was identified. - Tricuspid valve: There was trivial regurgitation. - Pulmonary arteries: PA peak pressure: 25 mm Hg (S). - Pericardium, extracardiac: A prominent pericardial fat pad was   present.  Impressions:  - Mild  LVH with LVEF 60-65% and grade 2 diastolic  dysfunction.   Calcified mitral annulus. Mildly sclerotic aortic valve. Trivial   tricuspid regurgitation with normal PASP estimated 25 mmHg.   Prominent pericardial fat pad noted.    Assessment and Plan  1. Bradycardia/fatigue/Afib - recent cardioversion, Toprol stopped Friday due to severe sinus bradycardia. She is back in afib today with rates 150s and symptomatic - sent to ER for management, would not repeat cardioversion in ER as she failed prior attempt, would need to be established on antiarrhtymic prior to second trial. Would avoid amio given her pulmonary fibrosis history. Normal LVEF with mild LVH in setting of diastolic HF. Will touch base with EP about class IC agent in this setting, optimize rate control at this time with dilt gtt.  - aggressive rate control led to bradycardia, I believe she will require an antiarrhythmic.   -discussed flecanide as option  with Dr Caryl Comes, flecanide reasonable in this scenario with mild LVH less than 1.4 cm (patient right at 1.3-1.4). Would start 50mg  bid, plan for repeat cardioversion once loaded  2. Weight gain/Acute on chronic diastolic HF - significant weight gain and abdominla distension, increased SOB - we will increase lasix to 60mg  daily      Arnoldo Lenis, M.D.

## 2017-02-09 NOTE — ED Provider Notes (Signed)
[facility] EMERGENCY DEPARTMENT Provider Note   CSN: [id number] Arrival date & time: 02/09/17  1228     History   Chief Complaint Chief Complaint  Patient presents with  . Weakness    HPI Lorraine Leonard is a 76 y.o. female.  HPI   76 year old female referred from cardiologist office for symptomatic atrial fibrillation with rapid ventricular response.  She has a past history of paroxysmal A. fib.  She had cardioversion on 10/12.  She subsequently developed bradycardia to 30/40s and felt very weak.  Her metoprolol was discontinued on 10/18.  She is currently on diltiazem and Eliquis.  Furosemide was increased on the 18th secondary to weight gain.  She followed back up in the cardiology office today.  She did lose several pounds over the weekend but still feeling very poorly and the aforementioned A. fib with RVR was present.    Past Medical History:  Diagnosis Date  . Acute diastolic CHF (congestive heart failure) ([place]) 11/11/2016  . Allergic rhinitis   . Anxiety   . Anxiety   . Arthritis   . Atrial fibrillation ([place]) 09/2016  . COPD (chronic obstructive pulmonary disease) ([place])   . Depression   . Diabetes mellitus    pre-diabetic  . Diverticulosis   . Dyspnea   . Dysrhythmia   . GERD (gastroesophageal reflux disease)   . Gout   . Hyperlipidemia   . Hypertension   . Hypothyroidism   . Lung fibrosis ([place])   . Macular degeneration   . Neuropathy   . Pulmonary fibrosis ([place])   . Rheumatoid arthritis(714.0)   . Sleep apnea    uses 3 liters of o2 24/7  . Urticaria     Patient Active Problem List   Diagnosis Date Noted  . Paroxysmal atrial fibrillation ([place]) 12/17/2016  . Black stool 12/17/2016  . Acute on chronic respiratory failure with hypoxia ([place]) 11/11/2016  . Acute diastolic CHF (congestive heart failure) ([place]) 11/11/2016  . CKD (chronic kidney disease) stage 2, GFR 60-89 ml/min 11/11/2016  . Atrial fibrillation with RVR ([place]) 11/10/2016  . Pulmonary  fibrosis ([place]) 09/23/2016  . Acute respiratory failure with hypoxia ([place]) 09/23/2016  . Diabetes mellitus ([place]) 07/17/2016  . Obesity, Class II, BMI 35-39.9 04/18/2016  . Snoring 11/09/2015  . Pulmonary hypertension ([place]) 11/09/2015  . Gout 10/19/2015  . Chronic urticaria 03/24/2015  . Metabolic syndrome 63/04/6008  . Single kidney 03/01/2015  . Macular degeneration of both eyes 11/28/2014  . Constipation 11/28/2014  . Chronic rhinitis 11/17/2014  . Hypertensive retinopathy 10/30/2014  . Vitamin D deficiency 08/17/2014  . Arthritis of knee 08/17/2014  . Hyperlipemia 08/17/2014  . Depression   . Hypothyroidism   . Anxiety   . Cardiomegaly 05/05/2011  . Essential hypertension 02/08/2007  . 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 GASTROESOPHAGEAL REFLUX DISEASE 02/08/2007    Past Surgical History:  Procedure Laterality Date  . CARDIOVERSION N/A 01/30/2017   Procedure: CARDIOVERSION;  Surgeon: Josue Hector, MD;  [facility];  Service: Cardiovascular;  Laterality: N/A;  . CATARACT EXTRACTION, BILATERAL Bilateral   . EUS N/A 10/03/2015   Procedure: UPPER ENDOSCOPIC ULTRASOUND (EUS) RADIAL;  Surgeon: Arta Silence, MD;  [facility];  Service: Endoscopy;  Laterality: N/A;  . FINE NEEDLE ASPIRATION N/A 10/03/2015   Procedure: FINE NEEDLE ASPIRATION (FNA) RADIAL;  Surgeon: Arta Silence, MD;  [facility];  Service: Endoscopy;  Laterality: N/A;  . 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  LUMBAR DISC SURGERY     x 2  . TUBAL LIGATION    . VAGINAL HYSTERECTOMY      OB History    No data available       Home Medications    Prior to Admission medications   Medication Sig Start Date End Date Taking? Authorizing Provider  albuterol (PROVENTIL HFA;VENTOLIN HFA) 108 (90 Base) MCG/ACT inhaler Inhale 2 puffs into the lungs every 6 (six) hours as needed for wheezing or shortness of breath. 09/25/16   Orson Eva, MD  azelastine (ASTELIN) 0.1 % nasal  spray PLACE 2 SPRAYS INTO BOTH NOSTRILS 2 (TWO) TIMES DAILY AS NEEDED. 01/14/17   Evelina Dun A, FNP  calcium citrate (CALCITRATE - DOSED IN MG ELEMENTAL CALCIUM) 950 MG tablet Take 1 tablet by mouth 2 (two) times daily.     [provider]  Cholecalciferol 2000 units CAPS Take 2,000 Units by mouth daily.     [provider]  conjugated estrogens (PREMARIN) vaginal cream Place 0.5 g vaginally every Thursday. Use as directed    [provider]  cyanocobalamin 500 MCG tablet Take 500 mcg by mouth daily.     [provider]  diazepam (VALIUM) 5 MG tablet TAKE 1 TABLET TWICE A DAY AS NEEDED FOR ANXIETY Patient taking differently: Take 5 mg by mouth 2 (two) times daily as needed for anxiety.  07/17/16   Sharion Balloon, FNP  diltiazem (CARDIZEM CD) 180 MG 24 hr capsule TAKE 1 CAPSULE BY MOUTH EVERY DAY 01/02/17   Evelina Dun A, FNP  docusate sodium (COLACE) 100 MG capsule Take 200 mg by mouth daily as needed for mild constipation.    [provider]  ELIQUIS 5 MG TABS tablet TAKE 1 TABLET BY MOUTH TWICE A DAY 01/23/17   Hawks, Christy A, FNP  EPINEPHrine 0.3 mg/0.3 mL IJ SOAJ injection Inject 0.3 mg into the muscle once as needed (For anaphylaxis.).    [provider]  esomeprazole (NEXIUM) 40 MG capsule Take 40 mg by mouth daily.  01/07/17   [provider]  ferrous sulfate 325 (65 FE) MG tablet Take 325 mg by mouth 2 (two) times daily with a meal.    [provider]  fluticasone (FLONASE) 50 MCG/ACT nasal spray PLACE 1 SPRAY INTO BOTH NOSTRILS 2 (TWO) TIMES DAILY AS NEEDED FOR ALLERGIES OR RHINITIS. 01/05/17   Sharion Balloon, FNP  furosemide (LASIX) 40 MG tablet Take 40 mg ( 1 Tablet) Alternating with 60 mg ( 1 1/2 Tablet) Daily 02/09/17   Arnoldo Lenis, MD  INCRUSE ELLIPTA 62.5 MCG/INH AEPB TAKE 1 PUFF BY MOUTH EVERY DAY 10/31/16   [provider]  levocetirizine (XYZAL) 5 MG tablet Take 5 mg by mouth every evening.   07/16/15   [provider]  levothyroxine (SYNTHROID, LEVOTHROID) 100 MCG tablet TAKE 1 TABLET (100 MCG TOTAL) BY MOUTH DAILY BEFORE BREAKFAST. 01/20/17   Evelina Dun A, FNP  metoprolol succinate (TOPROL-XL) 100 MG 24 hr tablet Take 1 tablet (100 mg total) by mouth daily. 02/09/17   Arnoldo Lenis, MD  mometasone (ELOCON) 0.1 % cream APPLY TO AFFECTED AREA DAILY 08/12/16   Evelina Dun A, FNP  Multiple Vitamins-Minerals (PRESERVISION AREDS 2 PO) Take 1 tablet by mouth 2 (two) times daily.    [provider]  ondansetron (ZOFRAN) 8 MG tablet Take 8 mg by mouth every 8 (eight) hours as needed for nausea or vomiting.    [provider]  OVER  THE COUNTER MEDICATION Place 1 drop into both eyes as needed (For dry eyes.). Systane Gel Drops    [provider]  PATADAY 0.2 % SOLN Place 1 drop into both eyes daily. 06/10/12   [provider]  polyethylene glycol powder (GLYCOLAX/MIRALAX) powder TAKE 17 G BY MOUTH 2 (TWO) TIMES DAILY AS NEEDED. 01/19/17   Evelina Dun A, FNP  predniSONE (DELTASONE) 10 MG tablet Take 10 mg by mouth daily with breakfast.    [provider]  rosuvastatin (CRESTOR) 10 MG tablet TAKE 1 TABLET BY MOUTH AT BEDTIME 02/02/17   Hawks, Christy A, FNP  traMADol (ULTRAM) 50 MG tablet TAKE 1 TABLET BY MOUTH EVERY 6 HOURS AS NEEDED Patient taking differently: TAKE 1 TABLET BY MOUTH EVERY 6 HOURS AS NEEDED FOR PAIN 12/03/15   Evelina Dun A, FNP  trimethoprim (TRIMPEX) 100 MG tablet Take 100 mg by mouth daily.    [provider]  VIGAMOX 0.5 % ophthalmic solution Apply 1 drop to eye as needed (after eye injection).  08/30/15   [provider]  vitamin C (ASCORBIC ACID) 500 MG tablet Take 500 mg by mouth daily.    [provider]  Vitamin D, Ergocalciferol, (DRISDOL) 50000 units CAPS capsule TAKE 1 CAPSULE (50,000 UNITS TOTAL) BY MOUTH EVERY 7 (SEVEN) DAYS. Patient taking differently: Take 50,000 Units by  mouth every 7 (seven) days. Take on Wednesday 11/21/16   Sharion Balloon, FNP  vitamin E 400 UNIT capsule Take 400 Units by mouth daily.    [provider]    Family History Family History  Problem Relation Age of Onset  . Diabetes Unknown   . Hypertension Unknown   . Coronary artery disease Unknown   . Stroke Unknown   . Asthma Unknown   . Kidney disease Sister   . Lung cancer Sister   . Lung cancer Sister   . Suicidality Son        committed suicide in 2009  . Hyperlipidemia Unknown   . Anxiety disorder Unknown   . Migraines Unknown     Social History Social History  Substance Use Topics  . Smoking status: Never Smoker  . Smokeless tobacco: Never Used  . Alcohol use No     Allergies   Allopurinol and Mobic [meloxicam]   Review of Systems Review of Systems  All systems reviewed and negative, other than as noted in HPI.   Physical Exam Updated Vital Signs BP 119/86 (BP Location: Right Arm)   Pulse 82   Temp 98.5 F (36.9 C) (Oral)   Resp 18   Ht 5\' 8"  (1.727 m)   Wt 109.8 kg (242 lb)   SpO2 96%   BMI 36.80 kg/m   Physical Exam  Constitutional: She appears well-developed and well-nourished. No distress.  Laying in bed.  Appears tired.  HENT:  Head: Normocephalic and atraumatic.  Eyes: Conjunctivae are normal. Right eye exhibits no discharge. Left eye exhibits no discharge.  Neck: Neck supple.  Cardiovascular: Exam reveals no gallop and no friction rub.   No murmur heard. Tachycardic.  Irregularly irregular.  Pulmonary/Chest: Effort normal and breath sounds normal. No respiratory distress.  Abdominal: Soft. She exhibits no distension. There is no tenderness.  Musculoskeletal: She exhibits no edema or tenderness.  Neurological: She is alert.  Skin: Skin is warm and dry.  Psychiatric: She has a normal mood and affect. Her behavior is normal. Thought content normal.  Nursing note and vitals reviewed.    ED Treatments /  Results  Labs (all  labs ordered are listed, but only abnormal results are displayed) Labs Reviewed  CBC WITH DIFFERENTIAL/PLATELET - Abnormal; Notable for the following:       Result Value   WBC 18.0 (*)    Neutro Abs 15.2 (*)    Monocytes Absolute 1.1 (*)    All other components within normal limits  BASIC METABOLIC PANEL  TROPONIN I  MAGNESIUM    EKG  EKG Interpretation  Date/Time:  Monday February 09 2017 12:50:18 EDT Ventricular Rate:  158 PR Interval:    QRS Duration: 77 QT Interval:  250 QTC Calculation: 406 R Axis:   32 Text Interpretation:  Atrial fibrillation with rapid V-rate Ventricular premature complex Aberrant complex Anteroseptal infarct, old Repolarization abnormality, prob rate related Confirmed by Virgel Manifold [phone]) on 02/09/2017 1:11:03 PM       Radiology Dg Chest Portable 1 View  Result Date: 02/09/2017 CLINICAL DATA:  Chest pain and weakness since Saturday. EXAM: PORTABLE CHEST 1 VIEW COMPARISON:  11/10/2016 FINDINGS: The heart is enlarged but stable. Stable tortuosity and calcification of the thoracic aorta. Chronic bronchitic type interstitial lung changes but no obvious acute overlying pulmonary process. Low lung volumes with vascular crowding and streaky atelectasis. The bony thorax is intact. IMPRESSION: Stable cardiac enlargement and tortuous calcified thoracic aorta. Low lung volumes with vascular crowding and streaky atelectasis. Chronic bronchitic type interstitial changes. Electronically Signed   By: Marijo Sanes M.D.   On: 02/09/2017 13:28    Procedures Procedures (including critical care time)  CRITICAL CARE Performed by: Virgel Manifold Total critical care time: 35 minutes. Symptomatic atrial fibrillation with sustained HR in 150s and necessitating initiating infusion/titration of diltiazem.   Critical care time was exclusive of separately billable procedures and treating other patients. Critical care was necessary to treat or prevent imminent or  life-threatening deterioration. Critical care was time spent personally by me on the following activities: development of treatment plan with patient and/or surrogate as well as nursing, discussions with consultants, evaluation of patient's response to treatment, examination of patient, obtaining history from patient or surrogate, ordering and performing treatments and interventions, ordering and review of laboratory studies, ordering and review of radiographic studies, pulse oximetry and re-evaluation of patient's condition.   Medications Ordered in ED Medications  diltiazem (CARDIZEM) 1 mg/mL load via infusion 15 mg (15 mg Intravenous Bolus from Bag 02/09/17 1337)    And  diltiazem (CARDIZEM) 100 mg in dextrose 5% 115mL (1 mg/mL) infusion (5 mg/hr Intravenous New Bag/Given 02/09/17 1338)     Initial Impression / Assessment and Plan / ED Course  I have reviewed the triage vital signs and the nursing notes.  Pertinent labs & imaging results that were available during my care of the patient were reviewed by me and considered in my medical decision making (see chart for details).    76 year old female with generalized weakness.  She is in atrial fibrillation with a rapid ventricular response.   EKG with elevation in aVR and diffuse ST depression.  Suspect this is likely rate related. She denies any acute pain. Evaluated by cardiology earlier today and referred to the emergency room.  Recommended diltiazem drip at this time and will see in consultation.  Admission to hospitalist service.  Final Clinical Impressions(s) / ED Diagnoses   Final diagnoses:  Atrial fibrillation with rapid ventricular response [facility])    New Prescriptions New Prescriptions   No medications on file     Virgel Manifold, MD 02/09/17 1350

## 2017-02-10 ENCOUNTER — Inpatient Hospital Stay (HOSPITAL_COMMUNITY): Payer: Medicare Other

## 2017-02-10 ENCOUNTER — Other Ambulatory Visit: Payer: Self-pay

## 2017-02-10 DIAGNOSIS — I10 Essential (primary) hypertension: Secondary | ICD-10-CM

## 2017-02-10 DIAGNOSIS — E785 Hyperlipidemia, unspecified: Secondary | ICD-10-CM

## 2017-02-10 DIAGNOSIS — I4891 Unspecified atrial fibrillation: Secondary | ICD-10-CM

## 2017-02-10 DIAGNOSIS — I361 Nonrheumatic tricuspid (valve) insufficiency: Secondary | ICD-10-CM

## 2017-02-10 DIAGNOSIS — E876 Hypokalemia: Secondary | ICD-10-CM

## 2017-02-10 DIAGNOSIS — J8410 Pulmonary fibrosis, unspecified: Secondary | ICD-10-CM

## 2017-02-10 DIAGNOSIS — E039 Hypothyroidism, unspecified: Secondary | ICD-10-CM

## 2017-02-10 DIAGNOSIS — R079 Chest pain, unspecified: Secondary | ICD-10-CM

## 2017-02-10 DIAGNOSIS — N182 Chronic kidney disease, stage 2 (mild): Secondary | ICD-10-CM

## 2017-02-10 DIAGNOSIS — I259 Chronic ischemic heart disease, unspecified: Secondary | ICD-10-CM

## 2017-02-10 LAB — BASIC METABOLIC PANEL
Anion gap: 12 (ref 5–15)
BUN: 20 mg/dL (ref 6–20)
CO2: 34 mmol/L — ABNORMAL HIGH (ref 22–32)
Calcium: 9.4 mg/dL (ref 8.9–10.3)
Chloride: 93 mmol/L — ABNORMAL LOW (ref 101–111)
Creatinine, Ser: 1.34 mg/dL — ABNORMAL HIGH (ref 0.44–1.00)
GFR calc Af Amer: 43 mL/min — ABNORMAL LOW (ref >60)
GFR calc non Af Amer: 37 mL/min — ABNORMAL LOW (ref >60)
Glucose, Bld: 175 mg/dL — ABNORMAL HIGH (ref 65–99)
Potassium: 3 mmol/L — ABNORMAL LOW (ref 3.5–5.1)
Sodium: 139 mmol/L (ref 135–145)

## 2017-02-10 LAB — ECHOCARDIOGRAM COMPLETE
E decel time: 201 msec
FS: 29 % (ref 28–44)
Height: 68 in
IVS/LV PW RATIO, ED: 1.16
LA ID, A-P, ES: 33 mm
LA diam end sys: 33 mm
LA diam index: 1.41 cm/m2
LA vol A4C: 54.8 ml
LA vol index: 21.2 mL/m2
LA vol: 49.7 mL
LV PW d: 13.2 mm — AB (ref 0.6–1.1)
LV dias vol index: 20 mL/m2
LV dias vol: 46 mL (ref 46–106)
LV sys vol index: 7 mL/m2
LV sys vol: 17 mL
LVOT SV: 53 mL
LVOT VTI: 16.9 cm
LVOT area: 3.14 cm2
LVOT diameter: 20 mm
LVOT peak grad rest: 4 mmHg
LVOT peak vel: 94.1 cm/s
Lateral S' vel: 11.2 cm/s
MV Dec: 201
MV Peak grad: 5 mmHg
MV pk E vel: 114 m/s
Reg peak vel: 241 cm/s
Simpson's disk: 63
Stroke v: 29 ml
TAPSE: 14.2 mm
TR max vel: 241 cm/s
Weight: 3869.51 oz

## 2017-02-10 LAB — URINALYSIS, ROUTINE W REFLEX MICROSCOPIC
Bilirubin Urine: NEGATIVE
Glucose, UA: NEGATIVE mg/dL
Hgb urine dipstick: NEGATIVE
Ketones, ur: NEGATIVE mg/dL
Leukocytes, UA: NEGATIVE
Nitrite: NEGATIVE
Protein, ur: NEGATIVE mg/dL
Specific Gravity, Urine: 1.011 (ref 1.005–1.030)
pH: 8 (ref 5.0–8.0)

## 2017-02-10 LAB — CBC
HCT: 40.2 % (ref 36.0–46.0)
Hemoglobin: 12.6 g/dL (ref 12.0–15.0)
MCH: 29 pg (ref 26.0–34.0)
MCHC: 31.3 g/dL (ref 30.0–36.0)
MCV: 92.6 fL (ref 78.0–100.0)
Platelets: 257 10*3/uL (ref 150–400)
RBC: 4.34 MIL/uL (ref 3.87–5.11)
RDW: 15.4 % (ref 11.5–15.5)
WBC: 12.8 10*3/uL — ABNORMAL HIGH (ref 4.0–10.5)

## 2017-02-10 LAB — TROPONIN I
Troponin I: 0.03 ng/mL (ref <0.03)
Troponin I: 0.03 ng/mL (ref <0.03)

## 2017-02-10 MED ORDER — DOCUSATE SODIUM 100 MG PO CAPS: 200.0000 mg | ORAL_CAPSULE | Freq: Every day | ORAL | Status: DC | PRN

## 2017-02-10 MED ORDER — CETIRIZINE HCL 10 MG PO TABS
10.0000 mg | ORAL_TABLET | Freq: Every evening | ORAL | Status: DC
  Administered 2017-02-10 – 2017-02-15 (×6): 10 mg via ORAL
  Filled 2017-02-10 (×7): qty 1

## 2017-02-10 MED ORDER — PANTOPRAZOLE SODIUM 40 MG PO TBEC
40.0000 mg | DELAYED_RELEASE_TABLET | Freq: Every day | ORAL | Status: DC
  Administered 2017-02-11 – 2017-02-16 (×6): 40 mg via ORAL
  Filled 2017-02-10 (×6): qty 1

## 2017-02-10 MED ORDER — MOMETASONE FUROATE 0.1 % EX CREA
TOPICAL_CREAM | Freq: Every day | CUTANEOUS | Status: DC
  Administered 2017-02-11 – 2017-02-16 (×5): via TOPICAL
  Filled 2017-02-10: qty 15

## 2017-02-10 MED ORDER — ALBUTEROL SULFATE (2.5 MG/3ML) 0.083% IN NEBU: 3.0000 mL | INHALATION_SOLUTION | Freq: Four times a day (QID) | RESPIRATORY_TRACT | Status: DC | PRN

## 2017-02-10 MED ORDER — AZELASTINE HCL 0.1 % NA SOLN
2.0000 | Freq: Two times a day (BID) | NASAL | Status: DC
  Administered 2017-02-10 – 2017-02-16 (×10): 2 via NASAL
  Filled 2017-02-10: qty 30

## 2017-02-10 MED ORDER — POTASSIUM CHLORIDE CRYS ER 20 MEQ PO TBCR
40.0000 meq | EXTENDED_RELEASE_TABLET | ORAL | Status: AC
  Administered 2017-02-10 (×2): 40 meq via ORAL
  Filled 2017-02-10 (×2): qty 2

## 2017-02-10 MED ORDER — DIAZEPAM 5 MG PO TABS
5.0000 mg | ORAL_TABLET | Freq: Once | ORAL | Status: AC
Start: 2017-02-10 — End: 2017-02-10
  Administered 2017-02-10: 5 mg via ORAL
  Filled 2017-02-10: qty 1

## 2017-02-10 MED ORDER — FLUTICASONE PROPIONATE 50 MCG/ACT NA SUSP
1.0000 | Freq: Two times a day (BID) | NASAL | Status: DC | PRN
  Filled 2017-02-10: qty 16

## 2017-02-10 MED ORDER — METOPROLOL TARTRATE 5 MG/5ML IV SOLN
5.0000 mg | INTRAVENOUS | Status: AC
  Administered 2017-02-10: 5 mg via INTRAVENOUS
  Filled 2017-02-10: qty 5

## 2017-02-10 MED ORDER — TRAMADOL HCL 50 MG PO TABS: 50.0000 mg | ORAL_TABLET | Freq: Four times a day (QID) | ORAL | Status: DC | PRN

## 2017-02-10 MED ORDER — DIAZEPAM 5 MG PO TABS
5.0000 mg | ORAL_TABLET | Freq: Two times a day (BID) | ORAL | Status: DC | PRN
  Filled 2017-02-10: qty 1

## 2017-02-10 MED ORDER — FERROUS SULFATE 325 (65 FE) MG PO TABS
325.0000 mg | ORAL_TABLET | Freq: Two times a day (BID) | ORAL | Status: DC
  Administered 2017-02-11 – 2017-02-16 (×11): 325 mg via ORAL
  Filled 2017-02-10 (×11): qty 1

## 2017-02-10 NOTE — Progress Notes (Signed)
PROGRESS NOTE    Lorraine Leonard  TFT:[id number] DOB: 27-Sep-1940 DOA: 02/09/2017 PCP: Sharion Balloon, FNP    Brief Narrative:  Lorraine Leonard is a 76 y.o. female with medical history significant of atrial fibrillation, GERD, hypothyroidism, essential HTN, pulmonary HTN, DM, Pulmonary fibrosis, CKD Stage 2 who was sent to the ED from [facility] after being found to be in atrial fibrillation with rapid ventricular response.  4 days prior to admission patient was at the heart clinic to get weighed and get an EKG.  She says her weight was up 12 lbs from the past two weeks and her heart rate was in the 40s.  She was told to stop her Toprol but continue her diltiazem.  She was to follow up today at the cardiology clinic.  At follow up today she had palpitations (which started yesterday) and fatigue.  Weight decreased from 253 to 242 over the past four days.  Today patient reports she feels very weak.  No fevers, chills.  Patient reports dyspnea, and minimal chest pain (wears 3L [place] at baseline). No dysuria or hematuria.  No diarrhea or constipation.   ED Course: Patient was seen and examined.  Found to be in A fib with RVR- started on diltiazem drip.  WBC elevated at 18k.  Hypertensive.  Slightly elevated troponin at 0.04.  TRH asked to admit.    Assessment & Plan:   Principal Problem:   Atrial fibrillation (HCC) Active Problems:   Essential hypertension   GASTROESOPHAGEAL REFLUX DISEASE   Hypothyroidism   Hyperlipemia   Diabetes mellitus (HCC)   Pulmonary fibrosis (HCC)   CKD (chronic kidney disease) stage 2, GFR 60-89 ml/min   Atrial fibrillation - patient on diltiazem drip - holding flecanide  - cardiology consulted - transfer patient to [facility] for evaluation by EP - continue eliquis  Chest pain - ST depressions on EKG this am - holding flecanide - transferring to [facility] - ST depression noted on EKG  Leukocytosis - UA negative - WBC decreased to 12 today - monitor for signs  of infection - patient on chronic prednisone at baseline but with increased neutrophils have suspicion for infectious etiology - CXR does not show evidence of pneumonia  Hypothyroidism - continue synthroid  HLD - continue daily statin  DM - does not appear to be on any medication outpatient - HgA1c pending  Pulmonary fibrosis - follows with Dr. Lamonte Sakai outpatient - continue chronic prednisone - pending respiratory status may need evaluation by pulmonology - currently on home oxygen requirement and breathing is unlabored  CKD Stage 2 - baseline creatinine appears to be around 0.9 - Cr still elevated - continue to monitor - holding lasix   DVT prophylaxis: eliquis Code Status:  Full code Family Communication:  no family bedside Disposition Plan: transferring to [facility] today    Consultants:   Cardiology  Procedures:   None  Antimicrobials:   None    Subjective: Patient voices she has had some chest pain this am for about 5 minutes.  Says she did not sleep much last night and feels "crummy" all around.  Denies any increased shortness of breath.   Objective: Vitals:   02/10/17 0430 02/10/17 0445 02/10/17 0450 02/10/17 0710  BP: 129/84 (!) 135/95    Pulse: (!) 128 (!) 129  (!) 192  Resp: (!) 27 (!) 29  19  Temp:    97.7 F (36.5 C)  TempSrc:    Oral  SpO2: 95% 95%  96%  Weight:   109.7 kg (241 lb 13.5 oz)   Height:        Intake/Output Summary (Last 24 hours) at 02/10/17 0820 Last data filed at 02/10/17 0336  Gross per 24 hour  Intake                0 ml  Output             1100 ml  Net            -1100 ml   Filed Weights   02/09/17 1233 02/10/17 0450  Weight: 109.8 kg (242 lb) 109.7 kg (241 lb 13.5 oz)    Examination:  General exam: Appears calm and comfortable  Respiratory system: scattered rhonchi throughout lung fields without rales Cardiovascular system: S1 & S2 heard, Irregularly irregular rhythm. No JVD, murmurs, rubs, gallops or  clicks. No pedal edema. Gastrointestinal system: Abdomen is nondistended, soft and nontender. No organomegaly or masses felt. Normal bowel sounds heard. Central nervous system: Alert and oriented. No focal neurological deficits. Extremities: Symmetric 5 x 5 power. Skin: No rashes, lesions or ulcers Psychiatry: Judgement and insight appear normal. Mood & affect appropriate.     Data Reviewed: I have personally reviewed following labs and imaging studies  CBC:  Recent Labs Lab 02/09/17 1327 02/10/17 0633  WBC 18.0* 12.8*  NEUTROABS 15.2*  --   HGB 14.0 12.6  HCT 44.0 40.2  MCV 92.4 92.6  PLT 301 502   Basic Metabolic Panel:  Recent Labs Lab 02/09/17 1327 02/10/17 0633  NA 142 139  K 3.6 3.0*  CL 94* 93*  CO2 35* 34*  GLUCOSE 185* 175*  BUN 19 20  CREATININE 1.26* 1.34*  CALCIUM 10.2 9.4  MG 1.9  --    GFR: Estimated Creatinine Clearance: 46.3 mL/min (A) (by C-G formula based on SCr of 1.34 mg/dL (H)). Liver Function Tests: No results for input(s): AST, ALT, ALKPHOS, BILITOT, PROT, ALBUMIN in the last 168 hours. No results for input(s): LIPASE, AMYLASE in the last 168 hours. No results for input(s): AMMONIA in the last 168 hours. Coagulation Profile: No results for input(s): INR, PROTIME in the last 168 hours. Cardiac Enzymes:  Recent Labs Lab 02/09/17 1327 02/09/17 1916 02/10/17 0044 02/10/17 0633  TROPONINI 0.04* 0.03* 0.03* 0.03*   BNP (last 3 results) No results for input(s): PROBNP in the last 8760 hours. HbA1C: No results for input(s): HGBA1C in the last 72 hours. CBG: No results for input(s): GLUCAP in the last 168 hours. Lipid Profile: No results for input(s): CHOL, HDL, LDLCALC, TRIG, CHOLHDL, LDLDIRECT in the last 72 hours. Thyroid Function Tests: No results for input(s): TSH, T4TOTAL, FREET4, T3FREE, THYROIDAB in the last 72 hours. Anemia Panel: No results for input(s): VITAMINB12, FOLATE, FERRITIN, TIBC, IRON, RETICCTPCT in the last 72  hours. Sepsis Labs: No results for input(s): PROCALCITON, LATICACIDVEN in the last 168 hours.  Recent Results (from the past 240 hour(s))  MRSA PCR Screening     Status: None   Collection Time: 02/09/17  6:38 PM  Result Value Ref Range Status   MRSA by PCR NEGATIVE NEGATIVE Final    Comment:        The GeneXpert MRSA Assay (FDA approved for NASAL specimens only), is one component of a comprehensive MRSA colonization surveillance program. It is not intended to diagnose MRSA infection nor to guide or monitor treatment for MRSA infections.          Radiology Studies: Dg Chest Portable  1 View  Result Date: 02/09/2017 CLINICAL DATA:  Chest pain and weakness since Saturday. EXAM: PORTABLE CHEST 1 VIEW COMPARISON:  11/10/2016 FINDINGS: The heart is enlarged but stable. Stable tortuosity and calcification of the thoracic aorta. Chronic bronchitic type interstitial lung changes but no obvious acute overlying pulmonary process. Low lung volumes with vascular crowding and streaky atelectasis. The bony thorax is intact. IMPRESSION: Stable cardiac enlargement and tortuous calcified thoracic aorta. Low lung volumes with vascular crowding and streaky atelectasis. Chronic bronchitic type interstitial changes. Electronically Signed   By: Marijo Sanes M.D.   On: 02/09/2017 13:28        Scheduled Meds: . apixaban  5 mg Oral BID  . flecainide  50 mg Oral Q12H  . levothyroxine  100 mcg Oral QAC breakfast  . rosuvastatin  10 mg Oral QHS  . trimethoprim  100 mg Oral Daily   Continuous Infusions: . diltiazem (CARDIZEM) infusion 15 mg/hr (02/10/17 0450)     LOS: 1 day    Time spent: 30 minutes    Loretha Stapler, MD [facility] Pager [phone]  If 7PM-7AM, please contact night-coverage [email or web address] Password [email or web address] 02/10/2017, 8:20 AM

## 2017-02-10 NOTE — Progress Notes (Addendum)
Progress Note  Patient Name: Lorraine Leonard Date of Encounter: 02/10/2017  Primary Cardiologist: Dr. Harl Bowie  Subjective   Remains feeling weak, somewhat short of breath. Had 5 minutes of recurrent CP this AM, resolved spontaneously.   Inpatient Medications    Scheduled Meds: . apixaban  5 mg Oral BID  . levothyroxine  100 mcg Oral QAC breakfast  . rosuvastatin  10 mg Oral QHS  . trimethoprim  100 mg Oral Daily   Continuous Infusions: . diltiazem (CARDIZEM) infusion 15 mg/hr (02/10/17 0450)   PRN Meds: acetaminophen **OR** acetaminophen, ondansetron **OR** ondansetron (ZOFRAN) IV, polyethylene glycol   Vital Signs    Vitals:   02/10/17 0710 02/10/17 0800 02/10/17 0900 02/10/17 1000  BP:  (!) 138/122 (!) 158/130 113/74  Pulse: (!) 192 (!) 141 86 (!) 117  Resp: 19 (!) 32 (!) 24 (!) 21  Temp: 97.7 F (36.5 C)     TempSrc: Oral     SpO2: 96% 97% 97% 99%  Weight:      Height:        Intake/Output Summary (Last 24 hours) at 02/10/17 1034 Last data filed at 02/10/17 1000  Gross per 24 hour  Intake               60 ml  Output             1500 ml  Net            -1440 ml   Filed Weights   02/09/17 1233 02/10/17 0450  Weight: 242 lb (109.8 kg) 241 lb 13.5 oz (109.7 kg)    Telemetry    Remains in rapid atrial fib HR 1teens-160s - Personally Reviewed  ECG    Atrial fibrillation, 125bpm, diffuse downsloped TWI I, II, avF, V4-V6 - Personally Reviewed  Physical Exam   GEN: No acute distress.  HEENT: Normocephalic, atraumatic, sclera non-icteric. Neck: No JVD or bruits. Cardiac: Rapid, irregular, no murmurs, rubs, or gallops.  Radials/DP/PT 1+ and equal bilaterally.  Respiratory: Clear to auscultation bilaterally. Breathing is unlabored. GI: Soft, nontender, non-distended, BS +x 4. MS: no deformity. Extremities: No clubbing or cyanosis. No edema. Distal pedal pulses are 2+ and equal bilaterally. Neuro:  AAOx3. Follows commands. Psych:  Responds to questions  appropriately with a normal affect.  Labs    Chemistry  Recent Labs Lab 02/09/17 1327 02/10/17 0633  NA 142 139  K 3.6 3.0*  CL 94* 93*  CO2 35* 34*  GLUCOSE 185* 175*  BUN 19 20  CREATININE 1.26* 1.34*  CALCIUM 10.2 9.4  GFRNONAA 40* 37*  GFRAA 47* 43*  ANIONGAP 13 12     Hematology  Recent Labs Lab 02/09/17 1327 02/10/17 0633  WBC 18.0* 12.8*  RBC 4.76 4.34  HGB 14.0 12.6  HCT 44.0 40.2  MCV 92.4 92.6  MCH 29.4 29.0  MCHC 31.8 31.3  RDW 15.3 15.4  PLT 301 257    Cardiac Enzymes  Recent Labs Lab 02/09/17 1327 02/09/17 1916 02/10/17 0044 02/10/17 0633  TROPONINI 0.04* 0.03* 0.03* 0.03*   No results for input(s): TROPIPOC in the last 168 hours.   BNPNo results for input(s): BNP, PROBNP in the last 168 hours.   DDimer No results for input(s): DDIMER in the last 168 hours.   Radiology    Dg Chest Portable 1 View  Result Date: 02/09/2017 CLINICAL DATA:  Chest pain and weakness since Saturday. EXAM: PORTABLE CHEST 1 VIEW COMPARISON:  11/10/2016 FINDINGS: The heart is enlarged  but stable. Stable tortuosity and calcification of the thoracic aorta. Chronic bronchitic type interstitial lung changes but no obvious acute overlying pulmonary process. Low lung volumes with vascular crowding and streaky atelectasis. The bony thorax is intact. IMPRESSION: Stable cardiac enlargement and tortuous calcified thoracic aorta. Low lung volumes with vascular crowding and streaky atelectasis. Chronic bronchitic type interstitial changes. Electronically Signed   By: Marijo Sanes M.D.   On: 02/09/2017 13:28    Cardiac Studies   2D echo June 2018 Study Conclusions  - Left ventricle: The cavity size was normal. Wall thickness was   increased in a pattern of mild LVH. Systolic function was normal.   The estimated ejection fraction was in the range of 60% to 65%.   Wall motion was normal; there were no regional wall motion   abnormalities. Features are consistent with a  pseudonormal left   ventricular filling pattern, with concomitant abnormal relaxation   and increased filling pressure (grade 2 diastolic dysfunction). - Aortic valve: Mildly calcified annulus. Trileaflet. - Mitral valve: Calcified annulus. - Right atrium: Central venous pressure (est): 3 mm Hg. - Atrial septum: No defect or patent foramen ovale was identified. - Tricuspid valve: There was trivial regurgitation. - Pulmonary arteries: PA peak pressure: 25 mm Hg (S). - Pericardium, extracardiac: A prominent pericardial fat pad was   present.  Impressions:  - Mild LVH with LVEF 60-65% and grade 2 diastolic dysfunction.   Calcified mitral annulus. Mildly sclerotic aortic valve. Trivial   tricuspid regurgitation with normal PASP estimated 25 mmHg.   Prominent pericardial fat pad noted.   Patient Profile     76 y.o. female with persistent atrial fib (dx 10/2016), bradycardia requiring discontinuation of Toprol, chronic diastolic CHF, pulm fibrosis with chronic respiratory failure, COPD, DM, HTN, hyperlipidemia, OSA, RA on steroids who was admitted with recurrent atrial fib. She was first evaluated 11/2016 for newly diagnosed atrial fib. Initially DCCV was planned but the patient had +FOBT and new anemia, prompting GI eval first. Unclear what workup required but she was cleared to return to eliquis and underwent DCCV 01/30/17. Due to weakness, pt self-reduced Toprol to 100mg  daily. At f/u visit 10/18 she remained bradycardic (SB with HR 40s, junctional escape beats) so Toprol was stopped and diltiazem continued. Seen back yesterday and found to be back in atrial fib RVR with dyspnea. Has also been having intermittent CP.  Assessment & Plan    1. Rapid atrial fibrillation complicated by evidence of tachy-brady syndrome - She remains tachycardic on IV diltiazem drip. As above, prior metoprolol held due to significant bradycardia.  She continues to be symptomatic from her atrial fib. Has not had  recent echo since atrial fib was diagnosed; would need to see what LVEF after HR settles down.  Dr. Harl Bowie curbsided EP yesterday who recommended initiation of flecainide and then consideration of repeat DCCV. In discussing with Dr. Bronson Ing, we feel she would benefit from formal EP evaluation particularly given her history of bradycardia as well as ST depression without ability to exclude underlying coronary disease at this point. Hold flecainide pending their eval. Recent DCCV 01/30/17 limits ability to interrupt anticoagulation for further procedures.  2. Intermittent chest pain - troponin trend low and flat. Remote nuc in 2014 was nonischemic. However, her EKG with a HR of 125 shows diffuse ST depression raising question of whether she could possibly have underlying coronary disease. I will review further with Dr. Bronson Ing.  3. AKI on possible CKD stage II (prior Cr 1-1.2) -  holding diuretic.  4. Chronic diastolic CHF - at OV yesterday she was felt to be volume overloaded with weight gain but weight appears quite variable, previously 238-246 earlier this year. She reports resolution of prior [name]. Would hold off on diuretic at present time given her AKI and softer BP.  5. Leukocytosis - appreciate IM input.  6. Pulmonary fibrosis/RA/COPD - per IM.  Signed, Melina Copa PA-C (pager [phone]) 02/10/2017, 10:34 AM    The patient was seen and examined, and I agree with the history, physical exam, assessment and plan as documented above, with modifications as noted below. I have also personally reviewed all relevant documentation, old records, labs, and both radiographic and cardiovascular studies. I have also independently interpreted old and new ECG's.  She remains in rapid atrial fibrillation on IV diltiazem, with diffuse ST segment depressions. She was complaining of intermittent precordial pain during my exam. Troponins have remained flat in 0.03 range.  As stated above, she had to be taken  off of Toprol-XL due to bradycardia in 40 bpm range.  Recommendations: Will stop flecainide given paroxysmal precordial pain with ST segment depressions and inability to exclude coronary artery disease at present time. Will provide IV metoprolol 5 mg for tachycardia and chest discomfort. Continue anticoagulation with Eliquis given recent DCCV. Continue Crestor. Hypokalemic (K is 3) so will replete. Mg ok.  She would be best served at [facility] with formal EP consultation for further arrhythmic management in the context of what appears to be ongoing ischemia. Hospitalist service will be primary service. This was discussed with Internal Medicine and they are in agreement.  Kate Sable, MD, [facility]  02/10/2017 11:00 AM

## 2017-02-10 NOTE — Progress Notes (Signed)
Ultrasound in. Pt reports sharp chest pain that spreads to her left arm. Dr. Adair Patter notified.

## 2017-02-10 NOTE — Progress Notes (Signed)
*  PRELIMINARY RESULTS* Echocardiogram 2D Echocardiogram has been performed.  Lorraine Leonard 02/10/2017, 3:42 PM

## 2017-02-10 NOTE — Plan of Care (Signed)
Problem: Activity: Goal: Risk for activity intolerance will decrease Outcome: Progressing Pt tolerates getting out of bed and into chair with some slight dyspnea on exertion.

## 2017-02-10 NOTE — Progress Notes (Signed)
Spoke with nurse. Pt's HR decreased to 90s following administration of IV metoprolol. SBP on softer side at 102 but patient reported to be feeling better. Continue plan per note. Evart Mcdonnell PA-C

## 2017-02-10 NOTE — Consult Note (Deleted)
Error

## 2017-02-11 DIAGNOSIS — E1165 Type 2 diabetes mellitus with hyperglycemia: Secondary | ICD-10-CM

## 2017-02-11 DIAGNOSIS — N390 Urinary tract infection, site not specified: Secondary | ICD-10-CM

## 2017-02-11 DIAGNOSIS — E662 Morbid (severe) obesity with alveolar hypoventilation: Secondary | ICD-10-CM

## 2017-02-11 DIAGNOSIS — I5032 Chronic diastolic (congestive) heart failure: Secondary | ICD-10-CM

## 2017-02-11 DIAGNOSIS — G4733 Obstructive sleep apnea (adult) (pediatric): Secondary | ICD-10-CM

## 2017-02-11 DIAGNOSIS — I481 Persistent atrial fibrillation: Principal | ICD-10-CM

## 2017-02-11 DIAGNOSIS — D72828 Other elevated white blood cell count: Secondary | ICD-10-CM

## 2017-02-11 DIAGNOSIS — J9611 Chronic respiratory failure with hypoxia: Secondary | ICD-10-CM

## 2017-02-11 LAB — MAGNESIUM: Magnesium: 2 mg/dL (ref 1.7–2.4)

## 2017-02-11 LAB — TSH: TSH: 4.648 u[IU]/mL — ABNORMAL HIGH (ref 0.350–4.500)

## 2017-02-11 LAB — BASIC METABOLIC PANEL
Anion gap: 9 (ref 5–15)
BUN: 15 mg/dL (ref 6–20)
CO2: 29 mmol/L (ref 22–32)
Calcium: 9.4 mg/dL (ref 8.9–10.3)
Chloride: 101 mmol/L (ref 101–111)
Creatinine, Ser: 1.22 mg/dL — ABNORMAL HIGH (ref 0.44–1.00)
GFR calc Af Amer: 49 mL/min — ABNORMAL LOW (ref >60)
GFR calc non Af Amer: 42 mL/min — ABNORMAL LOW (ref >60)
Glucose, Bld: 202 mg/dL — ABNORMAL HIGH (ref 65–99)
Potassium: 4 mmol/L (ref 3.5–5.1)
Sodium: 139 mmol/L (ref 135–145)

## 2017-02-11 LAB — HEMOGLOBIN A1C
Hgb A1c MFr Bld: 7.1 % — ABNORMAL HIGH (ref 4.8–5.6)
Mean Plasma Glucose: 157 mg/dL

## 2017-02-11 MED ORDER — POTASSIUM CHLORIDE CRYS ER 20 MEQ PO TBCR
40.0000 meq | EXTENDED_RELEASE_TABLET | ORAL | Status: AC
  Administered 2017-02-11 (×2): 40 meq via ORAL
  Filled 2017-02-11 (×2): qty 2

## 2017-02-11 MED ORDER — TRAZODONE HCL 50 MG PO TABS
25.0000 mg | ORAL_TABLET | Freq: Every evening | ORAL | Status: DC | PRN
  Administered 2017-02-11 – 2017-02-16 (×5): 25 mg via ORAL
  Filled 2017-02-11 (×5): qty 1

## 2017-02-11 MED ORDER — SPIRONOLACTONE 25 MG PO TABS
25.0000 mg | ORAL_TABLET | Freq: Every day | ORAL | Status: DC
  Administered 2017-02-11 – 2017-02-16 (×6): 25 mg via ORAL
  Filled 2017-02-11 (×6): qty 1

## 2017-02-11 MED ORDER — TRAZODONE HCL 50 MG PO TABS: 50.0000 mg | ORAL_TABLET | Freq: Every evening | ORAL | Status: DC | PRN

## 2017-02-11 NOTE — Progress Notes (Addendum)
PROGRESS NOTE    Lorraine Leonard  HEN:[id number] DOB: September 29, 1940 DOA: 02/09/2017 PCP: Sharion Balloon, FNP   Brief Narrative:  76 y.o. 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  4 days prior to admission patient was at the heart clinic to get weighed and get an EKG.  She says her weight was up 12 lbs from the past two weeks and her heart rate was in the 40s.  She was told to stop her Toprol but continue her diltiazem.  She was to follow up today at the cardiology clinic.  At follow up today she had palpitations (which started yesterday) and fatigue.  Weight decreased from 253 to 242 over the past four days.  Today patient reports she feels very weak.  No fevers, chills.  Patient reports dyspnea, and minimal chest pain (wears 3L [place] at baseline). No dysuria or hematuria.  No diarrhea or constipation.     Subjective: 10/24 A/O 4, negative CP, positive chronic SOB, negative abdominal pain. States [facility] performed resection of esophageal tumor 2017. States her pulmonologist has already suspects OSA/OHS but patient had refused outpatient sleep study.   Assessment & Plan:   Principal Problem:   Atrial fibrillation (HCC) Active Problems:   Essential hypertension   GASTROESOPHAGEAL REFLUX DISEASE   Hypothyroidism   Hyperlipemia   Diabetes mellitus (HCC)   Pulmonary fibrosis (HCC)   CKD (chronic kidney disease) stage 2, GFR 60-89 ml/min   Chest pain due to myocardial ischemia   Hypokalemia   Atrial fibrillation -continue diltiazem drip per cardiology  -rate control agents per cardiology (flecanide ?) - continue eliquis   Chest pain - ST depressions on EKG this am - holding flecanide - ST  depression noted on EKG   Chronic UTI -Bactrim per her urologist  Leukocytosis - UA negative - WBC decreased to 12 today - monitor for signs of infection - patient on chronic prednisone at baseline but with increased neutrophils have suspicion for infectious etiology - CXR does not show evidence of pneumonia   Hypothyroidism - continue synthroid   HLD - continue daily statin   DM Type 2 uncontrolled with Peripheral Neuropathy -10/23 Hemoglobin A1c= 7.1    Pulmonary Fibrosis/Chronic Respiratory Failure with Hypoxiaon home O2 3 L - follows with Dr. Lamonte Sakai outpatient - continue chronic prednisone - pending respiratory status may need evaluation by pulmonology - currently on home oxygen requirement and breathing is unlabored   CKD Stage 2(baseline Cr ~0.9) - baseline creatinine appears to be around 0.9 - Cr still elevated - continue to monitor - holding lasix  OSA/OHS -Patient willing to try CPAP while in the hospital.  -Trazodone 25 mg QHS PRN   DVT prophylaxis: SCD  Code Status: Full Family Communication: Sisters at bedside discussed plan of care Disposition Plan: TBD   Consultants:  Cardiology EP  Procedures/Significant Events:     I have personally reviewed and interpreted all radiology studies and my findings are as above.  VENTILATOR SETTINGS:    Cultures   Antimicrobials: Anti-infectives    Start     Stop   02/10/17 1000  trimethoprim (TRIMPEX) tablet 100 mg  Status:  Discontinued     02/11/17 1333  Devices    LINES / TUBES:      Continuous Infusions: . diltiazem (CARDIZEM) infusion 15 mg/hr (02/11/17 1555)     Objective: Vitals:   02/11/17 0810 02/11/17 1209 02/11/17 1405 02/11/17 1500  BP: 124/74 110/66 (!) 100/58   Pulse: (!) 127 (!) 116 93   Resp: 15 18 15    Temp:    97.8 F (36.6 C)  TempSrc:    Oral  SpO2: 98% 97% 98%   Weight:      Height:        Intake/Output Summary (Last 24 hours) at 02/11/17 1628 Last  data filed at 02/11/17 1400  Gross per 24 hour  Intake              990 ml  Output              850 ml  Net              140 ml   Filed Weights   02/09/17 1233 02/10/17 0450 02/11/17 0411  Weight: 242 lb (109.8 kg) 241 lb 13.5 oz (109.7 kg) 241 lb (109.3 kg)    Examination:  General: A/O 4, positive chronic respiratory distress Neck:  Negative scars, masses, torticollis, lymphadenopathy, JVD Lungs: diffuse poor air movement, bibasal crackles, negative wheezing  Cardiovascular: Irregular irregular rhythm and rate, without murmur gallop or rub normal S1 and S2 Abdomen: MORBIDLY OBESE, negative abdominal pain, nondistended, positive soft, bowel sounds, no rebound, no ascites, no appreciable mass Extremities: No significant cyanosis, clubbing, or edema bilateral lower extremities Skin: Negative rashes, lesions, ulcers Psychiatric:  Negative depression, negative anxiety, negative fatigue, negative mania  Central nervous system:  Cranial nerves II through XII intact, tongue/uvula midline, all extremities muscle strength 5/5, sensation intact throughout,negative dysarthria, negative expressive aphasia, negative receptive aphasia.  .     Data Reviewed: Care during the described time interval was provided by me .  I have reviewed this patient's available data, including medical history, events of note, physical examination, and all test results as part of my evaluation.   CBC:  Recent Labs Lab 02/09/17 1327 02/10/17 0633  WBC 18.0* 12.8*  NEUTROABS 15.2*  --   HGB 14.0 12.6  HCT 44.0 40.2  MCV 92.4 92.6  PLT 301 474   Basic Metabolic Panel:  Recent Labs Lab 02/09/17 1327 02/10/17 0633 02/11/17 1001  NA 142 139 139  K 3.6 3.0* 4.0  CL 94* 93* 101  CO2 35* 34* 29  GLUCOSE 185* 175* 202*  BUN 19 20 15   CREATININE 1.26* 1.34* 1.22*  CALCIUM 10.2 9.4 9.4  MG 1.9  --  2.0   GFR: Estimated Creatinine Clearance: 50.8 mL/min (A) (by C-G formula based on SCr of 1.22 mg/dL  (H)). Liver Function Tests: No results for input(s): AST, ALT, ALKPHOS, BILITOT, PROT, ALBUMIN in the last 168 hours. No results for input(s): LIPASE, AMYLASE in the last 168 hours. No results for input(s): AMMONIA in the last 168 hours. Coagulation Profile: No results for input(s): INR, PROTIME in the last 168 hours. Cardiac Enzymes:  Recent Labs Lab 02/09/17 1327 02/09/17 1916 02/10/17 0044 02/10/17 0633  TROPONINI 0.04* 0.03* 0.03* 0.03*   BNP (last 3 results) No results for input(s): PROBNP in the last 8760 hours. HbA1C:  Recent Labs  02/10/17 0633  HGBA1C 7.1*   CBG: No results for input(s): GLUCAP in the last 168 hours. Lipid Profile: No results for input(s): CHOL, HDL, LDLCALC, TRIG, CHOLHDL, LDLDIRECT in the last 72  hours. Thyroid Function Tests:  Recent Labs  02/11/17 1448  TSH 4.648*   Anemia Panel: No results for input(s): VITAMINB12, FOLATE, FERRITIN, TIBC, IRON, RETICCTPCT in the last 72 hours. Urine analysis:    Component Value Date/Time   COLORURINE YELLOW 02/09/2017 2320   APPEARANCEUR CLEAR 02/09/2017 2320   LABSPEC 1.011 02/09/2017 2320   PHURINE 8.0 02/09/2017 2320   GLUCOSEU NEGATIVE 02/09/2017 2320   HGBUR NEGATIVE 02/09/2017 2320   BILIRUBINUR NEGATIVE 02/09/2017 2320   BILIRUBINUR neg 04/06/2015 1517   KETONESUR NEGATIVE 02/09/2017 2320   PROTEINUR NEGATIVE 02/09/2017 2320   UROBILINOGEN negative 04/06/2015 1517   UROBILINOGEN 0.2 05/14/2011 1942   NITRITE NEGATIVE 02/09/2017 2320   LEUKOCYTESUR NEGATIVE 02/09/2017 2320   Sepsis Labs: @LABRCNTIP (procalcitonin:4,lacticidven:4)  ) Recent Results (from the past 240 hour(s))  MRSA PCR Screening     Status: None   Collection Time: 02/09/17  6:38 PM  Result Value Ref Range Status   MRSA by PCR NEGATIVE NEGATIVE Final    Comment:        The GeneXpert MRSA Assay (FDA approved for NASAL specimens only), is one component of a comprehensive MRSA colonization surveillance program. It  is not intended to diagnose MRSA infection nor to guide or monitor treatment for MRSA infections.          Radiology Studies: No results found.      Scheduled Meds: . apixaban  5 mg Oral BID  . azelastine  2 spray Each Nare BID  . cetirizine  10 mg Oral QPM  . ferrous sulfate  325 mg Oral BID WC  . levothyroxine  100 mcg Oral QAC breakfast  . mometasone   Topical Daily  . pantoprazole  40 mg Oral Daily  . potassium chloride  40 mEq Oral Q4H  . rosuvastatin  10 mg Oral QHS  . spironolactone  25 mg Oral Daily   Continuous Infusions: . diltiazem (CARDIZEM) infusion 15 mg/hr (02/11/17 1555)     LOS: 2 days    Time spent: 40 minutes    WOODS, Geraldo Docker, MD [facility] Pager [phone]   If 7PM-7AM, please contact night-coverage [email or web address] Password [email or web address] 02/11/2017, 4:28 PM

## 2017-02-11 NOTE — Progress Notes (Addendum)
Pharmacy Review for Dofetilide (Tikosyn) Initiation  Admit Complaint: 76 y.o. female admitted 02/09/2017 with atrial fibrillation MAY be initiated on dofetilide.   Assessment:  Patient Exclusion Criteria: If any screening criteria checked as "Yes", then  patient  should NOT receive dofetilide until criteria item is corrected. If "Yes" please indicate correction plan.  YES  NO Patient  Exclusion Criteria Correction Plan  []  [x]  Baseline QTc interval is greater than or equal to 440 msec. IF above YES box checked dofetilide contraindicated unless patient has ICD; then may proceed if QTc 500-550 msec or with known ventricular conduction abnormalities may proceed with QTc 550-600 msec. QTc = 406   []  [x]  Magnesium level is less than 1.8 mEq/l : Last magnesium:  Lab Results  Component Value Date   MG 1.9 02/09/2017         [x]  []  Potassium level is less than 4 mEq/l : Last potassium:  Lab Results  Component Value Date   K 3.0 (L) 02/10/2017       Replace K+?  []  [x]  Patient is known or suspected to have a digoxin level greater than 2 ng/ml: No results found for: DIGOXIN    []  [x]  Creatinine clearance less than 20 ml/min (calculated using Cockcroft-Gault, actual body weight and serum creatinine): Estimated Creatinine Clearance: 46.3 mL/min (A) (by C-G formula based on SCr of 1.34 mg/dL (H)).    []  [x]  Patient has received drugs known to prolong the QT intervals within the last 48 hours (phenothiazines, tricyclics or tetracyclic antidepressants, erythromycin, H-1 antihistamines, cisapride, fluoroquinolones, azithromycin). Drugs not listed above may have an, as yet, undetected potential to prolong the QT interval, updated information on QT prolonging agents is available at this website:QT prolonging agents   []  [x]  Patient received a dose of hydrochlorothiazide (Oretic) alone or in any combination including triamterene (Dyazide, Maxzide) in the last 48 hours.   [x]  []  Patient received a  medication known to increase dofetilide plasma concentrations prior to initial dofetilide dose:  . Trimethoprim (Primsol, Proloprim) in the last 36 hours . Verapamil (Calan, Verelan) in the last 36 hours or a sustained release dose in the last 72 hours . Megestrol (Megace) in the last 5 days  . Cimetidine (Tagamet) in the last 6 hours . Ketoconazole (Nizoral) in the last 24 hours . Itraconazole (Sporanox) in the last 48 hours  . Prochlorperazine (Compazine) in the last 36 hours  D/c Trimethoprim?  []  [x]  Patient is known to have a history of torsades de pointes; congenital or acquired long QT syndromes.   [x]  [x]  Patient has received a Class 1 antiarrhythmic with less than 2 half-lives since last dose. (Disopyramide, Quinidine, Procainamide, Lidocaine, Mexiletine, Flecainide, Propafenone) Flecainide 10/22 and 10/23. Must wait at least 2 half lives (48 hrs)  []  [x]  Patient has received amiodarone therapy in the past 3 months or amiodarone level is greater than 0.3 ng/ml.    Patient has been appropriately anticoagulated with Eliquis.  Ordering provider was confirmed at [email or web address] if they are not listed on the [place] Prescribers list.  Goal of Therapy: Follow renal function, electrolytes, potential drug interactions, and dose adjustment. Provide education and 1 week supply at discharge.  Plan:  []   Physician selected initial dose within range recommended for patients level of renal function - will monitor for response.  []   Physician selected initial dose outside of range recommended for patients level of renal function - will discuss if the dose should be altered at this time.  Select One Calculated CrCl  Dose q12h  []  > 60 ml/min 500 mcg  []  40-60 ml/min 250 mcg  []  20-40 ml/min 125 mcg   2. Follow up QTc after the first 5 doses, renal function, electrolytes (K & Mg) daily x 3     days, dose adjustment, success of initiation and facilitate 1 week discharge supply as      clinically indicated.  3. Initiate Tikosyn education video (Call [phone] and ask for Tikosyn Video # 116).  4. Place Enrollment Form on the chart for discharge supply of dofetilide.  Text paged EP PA regarding contraindication to Tikosyn until Trimethoprim held at least 36 hrs, K+ replaced, and must wait at least 2 half lives (48 hr) since last dose of Flecainide on 10/23.  Sade Hollon S. Alford Highland, PharmD, [place] Clinical Staff Pharmacist Pager [phone]  Eilene Ghazi Stillinger 10:37 AM 02/11/2017

## 2017-02-11 NOTE — Consult Note (Addendum)
Cardiology Consultation:   Patient ID: Lorraine Leonard; [id number]; [birth year]   Admit date: 02/09/2017 Date of Consult: 02/11/2017  Primary Care Provider: Sharion Balloon, FNP Primary Cardiologist: Dr. Harl Bowie    Patient Profile:   Lorraine Leonard is a 76 y.o. female with a hx of recently new finding of AFib in July of this year with bradiacardia historically requiring discontinuation of BB, chronic diastolic CHF, pulmonary fibrosis w/chronic resp insufficieny, COPD, DM, HTN, HLD, RA on chronic steroids who is being seen today for the evaluation of potential tachy-brady syndrome at the request of Dr. Jacinta Shoe.  History of Present Illness:   Ms. Speakman was admitted to [facility] 2/2 rapid AFib. In review or records she follows with Dr. Harl Bowie, at a visit on 02/04/17 she was c/o weakness/fatigue and on her own had reduced her Toprol, she was found in SB 40s and her BB was stopped (dilt continued) and given increased lasix dose with fluid OL.  She developed palpitations, SOB and rapid weght gain seen again on 02/09/17 found in rapid AFib, remained fluid OL and referred to the hospital for further care and management. Notes report anemia w/+FOB undergoing an unrevealing GI w/u cleared for Eliquis underwent DCCV 01/30/17.  She was started on diltiazem gtt at [facility], Dr. Jacinta Shoe discussed the case with dr. Caryl Comes who recommended flecainide that was started at 50mg  BID with thought to revisit DCCV on an AAD, (she received 2 doses prior to stopping).  The patient remained tachycardiac with intermittent c/o CP and some ST depression, given inability to interrupt her a/c post DCCV, no procedures planned, and her flecainide was stopped with concerns of potential underlying CAD.  She was planned for transfer to [facility] for EP evaluation and management.  Her lasix was held 2/2 ARI  Current rate meds: diltiazem gtt  eliquis for a/c  LABS  K+ 3.0 BUN/Creat 1.34 TropI 0.04, 0.03 x3 WBC 12.8 H/H 12/40 plts  257  Last TSH 11/10/16 was 2.402  The patient reports feeling the palpitations and heavy in her chest with the AFib, especially when fast, she felt weak, fatigued with slow rates, bit not the heaviness/discomfort.  No reports of syncope or near syncope.   Past Medical History:  Diagnosis Date  . Acute diastolic CHF (congestive heart failure) ([place]) 11/11/2016  . Allergic rhinitis   . Anxiety   . Anxiety   . Arthritis   . Atrial fibrillation ([place]) 09/2016  . COPD (chronic obstructive pulmonary disease) ([place])   . Depression   . Diabetes mellitus    pre-diabetic  . Diverticulosis   . Dyspnea   . Dysrhythmia   . GERD (gastroesophageal reflux disease)   . Gout   . Hyperlipidemia   . Hypertension   . Hypothyroidism   . Lung fibrosis ([place])   . Macular degeneration   . Neuropathy   . Pulmonary fibrosis ([place])   . Rheumatoid arthritis(714.0)   . Sleep apnea    uses 3 liters of o2 24/7  . Urticaria     Past Surgical History:  Procedure Laterality Date  . CARDIOVERSION N/A 01/30/2017   Procedure: CARDIOVERSION;  Surgeon: Josue Hector, MD;  Location: [facility];  Service: Cardiovascular;  Laterality: N/A;  . CATARACT EXTRACTION, BILATERAL Bilateral   . EUS N/A 10/03/2015   Procedure: UPPER ENDOSCOPIC ULTRASOUND (EUS) RADIAL;  Surgeon: Arta Silence, MD;  Location: [facility] ENDOSCOPY;  Service: Endoscopy;  Laterality: N/A;  . FINE NEEDLE ASPIRATION N/A 10/03/2015   Procedure: FINE  NEEDLE ASPIRATION (FNA) RADIAL;  Surgeon: Arta Silence, MD;  Location: [facility] ENDOSCOPY;  Service: Endoscopy;  Laterality: N/A;  . 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 LUMBAR DISC SURGERY     x 2  . TUBAL LIGATION    . VAGINAL HYSTERECTOMY         Inpatient Medications: Scheduled Meds: . apixaban  5 mg Oral BID  . azelastine  2 spray Each Nare BID  . cetirizine  10 mg Oral QPM  . ferrous sulfate  325 mg Oral BID WC  . levothyroxine  100 mcg Oral QAC breakfast  . mometasone   Topical  Daily  . pantoprazole  40 mg Oral Daily  . rosuvastatin  10 mg Oral QHS  . trimethoprim  100 mg Oral Daily   Continuous Infusions: . diltiazem (CARDIZEM) infusion 15 mg/hr (02/11/17 0906)   PRN Meds: acetaminophen **OR** acetaminophen, albuterol, diazepam, docusate sodium, fluticasone, ondansetron **OR** ondansetron (ZOFRAN) IV, polyethylene glycol, traMADol  Allergies:    Allergies  Allergen Reactions  . Allopurinol Swelling    Feet and legs swell  . Mobic [Meloxicam] Swelling and Other (See Comments)    Feet and Leg swelling    Social History:   Social History   Social History  . Marital status: Married    Spouse name: N/A  . Number of children: 4  . Years of education: N/A   Occupational History  . retired     [employer]   Social History Main Topics  . Smoking status: Never Smoker  . Smokeless tobacco: Never Used  . Alcohol use No  . Drug use: No  . Sexual activity: Not Currently    Birth control/ protection: Surgical   Other Topics Concern  . Not on file   Social History Narrative   Pt ONLY HAS ONE KIDNEY, she donated one of her kidneys to her sister    Family History:   Family History  Problem Relation Age of Onset  . Diabetes Unknown   . Hypertension Unknown   . Coronary artery disease Unknown   . Stroke Unknown   . Asthma Unknown   . Kidney disease Sister   . Lung cancer Sister   . Lung cancer Sister   . Suicidality Son        committed suicide in 2009  . Hyperlipidemia Unknown   . Anxiety disorder Unknown   . Migraines Unknown      ROS:  Please see the history of present illness.  ROS All other ROS reviewed and negative.     Physical Exam/Data:   Vitals:   02/11/17 0028 02/11/17 0411 02/11/17 0700 02/11/17 0810  BP: 125/76 (!) 141/69  124/74  Pulse: (!) 116 (!) 123  (!) 127  Resp: (!) 22 (!) 21  15  Temp: 98.1 F (36.7 C) 98 F (36.7 C) 98.2 F (36.8 C)   TempSrc: Oral Oral Oral   SpO2: 98% 98%  98%  Weight:  241 lb (109.3 kg)      Height:  5\' 8"  (1.727 m)      Intake/Output Summary (Last 24 hours) at 02/11/17 0923 Last data filed at 02/11/17 0412  Gross per 24 hour  Intake              165 ml  Output              300 ml  Net             -  135 ml   Filed Weights   02/09/17 1233 02/10/17 0450 02/11/17 0411  Weight: 242 lb (109.8 kg) 241 lb 13.5 oz (109.7 kg) 241 lb (109.3 kg)   Body mass index is 36.64 kg/m.  General:  Well nourished, well developed, in no acute distress HEENT: normal Lymph: no adenopathy Neck: no JVD Endocrine:  No thryomegaly Vascular: No carotid bruits  Cardiac:  IRRR; no murmurs, gallops or rubs appreciated Lungs:  CTA b/l, no wheezing, rhonchi or rales  Abd: soft, nontender  Ext: no edema Musculoskeletal:  No deformities, BUE and BLE strength normal and equal Skin: warm and dry  Neuro:   No gross focal abnormalities noted Psych:  Normal affect   EKG:  The EKG was personally reviewed and demonstrates:   AFib 158bpm, QRS 61ms Telemetry:  Telemetry was personally reviewed and demonstrates:   AFib since here 100-120  Relevant CV Studies:  02/10/17: TTE Study Conclusions - Left ventricle: The cavity size was normal. Wall thickness was   increased in a pattern of moderate LVH. Systolic function was   normal. The estimated ejection fraction was in the range of 55%   to 60%. Wall motion was normal; there were no regional wall   motion abnormalities. - Mitral valve: Mildly calcified leaflets . There was trivial   regurgitation. - Right ventricle: Systolic function was low normal. - Right atrium: Central venous pressure (est): 3 mm Hg. - Tricuspid valve: There was mild regurgitation. - Pulmonary arteries: PA peak pressure: 26 mm Hg (S). - Pericardium, extracardiac: There was no pericardial effusion. Impressions: - Moderate LVH with LVEF 55-60% and indeterminate diastolic   function in the setting of atrial fibrillation. Mildly calcified   mitral annulus with trivial mitral  regurgitation. Mild tricuspid   regurgitation with PASP 26 mmHg.  Laboratory Data:  Chemistry Recent Labs Lab 02/09/17 1327 02/10/17 0633  NA 142 139  K 3.6 3.0*  CL 94* 93*  CO2 35* 34*  GLUCOSE 185* 175*  BUN 19 20  CREATININE 1.26* 1.34*  CALCIUM 10.2 9.4  GFRNONAA 40* 37*  GFRAA 47* 43*  ANIONGAP 13 12    No results for input(s): PROT, ALBUMIN, AST, ALT, ALKPHOS, BILITOT in the last 168 hours. Hematology Recent Labs Lab 02/09/17 1327 02/10/17 0633  WBC 18.0* 12.8*  RBC 4.76 4.34  HGB 14.0 12.6  HCT 44.0 40.2  MCV 92.4 92.6  MCH 29.4 29.0  MCHC 31.8 31.3  RDW 15.3 15.4  PLT 301 257   Cardiac Enzymes Recent Labs Lab 02/09/17 1327 02/09/17 1916 02/10/17 0044 02/10/17 0633  TROPONINI 0.04* 0.03* 0.03* 0.03*   No results for input(s): TROPIPOC in the last 168 hours.  BNPNo results for input(s): BNP, PROBNP in the last 168 hours.  DDimer No results for input(s): DDIMER in the last 168 hours.  Radiology/Studies:   Dg Chest Portable 1 View Result Date: 02/09/2017 CLINICAL DATA:  Chest pain and weakness since Saturday. EXAM: PORTABLE CHEST 1 VIEW COMPARISON:  11/10/2016 FINDINGS: The heart is enlarged but stable. Stable tortuosity and calcification of the thoracic aorta. Chronic bronchitic type interstitial lung changes but no obvious acute overlying pulmonary process. Low lung volumes with vascular crowding and streaky atelectasis. The bony thorax is intact. IMPRESSION: Stable cardiac enlargement and tortuous calcified thoracic aorta. Low lung volumes with vascular crowding and streaky atelectasis. Chronic bronchitic type interstitial changes. Electronically Signed   By: Marijo Sanes M.D.   On: 02/09/2017 13:28    Assessment and Plan:   1.  AFib w/RVR, sinus bradycardia     Symptomatic with both     Continue diltiazem gtt for now  Will plan likely for Tikosyn, recheck her electrolytes, I will ask pharmacy consult to evaluate her med list, last SR EKG QT  looks good.  Cchronic hypoxic resp failure, pulmonary fibrosis, in review of pulm notes, stong suspicion of OSA though patient declined testing, obesity all play a role likley to make rhythm control difficult though has not been bother by AFib for long that we know of.  LA measured 42mm   2. Chronic Diastolic CHF     I/O appear euvolemic, exam currently does not suggest fluid OL, CXR w/o pulmonary edema or efussion     Weight in-patient stable (down 1lb)     There is question as to where her actual dry weight is  3. CP      No known hx of CAD     Neg Trop     Not active   c/w IM service:: 4. Pulmonary Fibrosis 5. RA 6 Chronic UTI on ABx suppression  7.  Hypothyroidism, treated  TSH 4>>2 will recheck  8 hHypokalemia   For questions or updates, please contact [place] Please consult [email or web address] for contact info under Cardiology/STEMI.   Signed, Baldwin Jamaica, PA-C  02/11/2017 9:23 AM  Hx and Leonard as above x as amended   Patient has recurrent paroxysms of persistent atrial fibrillation with a rapid rate and associated chest pain. She likely has coronary disease based on her Myoview from a few years ago; however, left ventricular hypertrophy has been measured 1.3--1.5 cm. This would preclude the use of a 1C antiarrhythmic. Her pulmonary issues make amiodarone less than favorable. Dronaderone is not very effective. Hence, I have recommended that we use dofetilide. There is interaction with trimethoprim which she uses for UTI chronic suppression; hence, we will discontinue it and begin dofetilide tomorrow.this will also allow [place] to normalize her potassium.  Her atrial dimension is surprisingly small. I will asked Dr. Rayann Heman to consider her candidacy for catheter ablation for atrial fibrillation in the event that the dofetilide fails.  Will recheck TSH Further replete K and will begin aldactone   I reviewed the above with the patient

## 2017-02-12 DIAGNOSIS — N390 Urinary tract infection, site not specified: Secondary | ICD-10-CM

## 2017-02-12 DIAGNOSIS — D490 Neoplasm of unspecified behavior of digestive system: Secondary | ICD-10-CM

## 2017-02-12 DIAGNOSIS — E1142 Type 2 diabetes mellitus with diabetic polyneuropathy: Secondary | ICD-10-CM

## 2017-02-12 DIAGNOSIS — N183 Chronic kidney disease, stage 3 (moderate): Secondary | ICD-10-CM

## 2017-02-12 DIAGNOSIS — E1165 Type 2 diabetes mellitus with hyperglycemia: Secondary | ICD-10-CM

## 2017-02-12 DIAGNOSIS — I4891 Unspecified atrial fibrillation: Secondary | ICD-10-CM

## 2017-02-12 DIAGNOSIS — D72829 Elevated white blood cell count, unspecified: Secondary | ICD-10-CM

## 2017-02-12 DIAGNOSIS — J8410 Pulmonary fibrosis, unspecified: Secondary | ICD-10-CM

## 2017-02-12 DIAGNOSIS — E662 Morbid (severe) obesity with alveolar hypoventilation: Secondary | ICD-10-CM

## 2017-02-12 DIAGNOSIS — G4733 Obstructive sleep apnea (adult) (pediatric): Secondary | ICD-10-CM

## 2017-02-12 LAB — BASIC METABOLIC PANEL
Anion gap: 7 (ref 5–15)
BUN: 16 mg/dL (ref 6–20)
CO2: 29 mmol/L (ref 22–32)
Calcium: 9.1 mg/dL (ref 8.9–10.3)
Chloride: 106 mmol/L (ref 101–111)
Creatinine, Ser: 1.25 mg/dL — ABNORMAL HIGH (ref 0.44–1.00)
GFR calc Af Amer: 47 mL/min — ABNORMAL LOW (ref >60)
GFR calc non Af Amer: 41 mL/min — ABNORMAL LOW (ref >60)
Glucose, Bld: 137 mg/dL — ABNORMAL HIGH (ref 65–99)
Potassium: 4.7 mmol/L (ref 3.5–5.1)
Sodium: 142 mmol/L (ref 135–145)

## 2017-02-12 LAB — GLUCOSE, CAPILLARY
Glucose-Capillary: 145 mg/dL — ABNORMAL HIGH (ref 65–99)
Glucose-Capillary: 150 mg/dL — ABNORMAL HIGH (ref 65–99)

## 2017-02-12 LAB — MAGNESIUM: Magnesium: 2 mg/dL (ref 1.7–2.4)

## 2017-02-12 MED ORDER — DILTIAZEM HCL ER COATED BEADS 180 MG PO CP24
300.0000 mg | ORAL_CAPSULE | Freq: Every day | ORAL | Status: DC
  Administered 2017-02-12: 300 mg via ORAL
  Filled 2017-02-12: qty 1

## 2017-02-12 MED ORDER — PREDNISONE 10 MG PO TABS
10.0000 mg | ORAL_TABLET | Freq: Every day | ORAL | Status: DC
  Administered 2017-02-12 – 2017-02-16 (×4): 10 mg via ORAL
  Filled 2017-02-12 (×5): qty 1

## 2017-02-12 MED ORDER — LEVALBUTEROL HCL 1.25 MG/0.5ML IN NEBU
INHALATION_SOLUTION | RESPIRATORY_TRACT | Status: AC
  Filled 2017-02-12: qty 0.5

## 2017-02-12 MED ORDER — INSULIN ASPART 100 UNIT/ML ~~LOC~~ SOLN
0.0000 [IU] | SUBCUTANEOUS | Status: DC
  Administered 2017-02-12 (×2): 1 [IU] via SUBCUTANEOUS
  Administered 2017-02-13: 2 [IU] via SUBCUTANEOUS
  Administered 2017-02-13: 3 [IU] via SUBCUTANEOUS
  Administered 2017-02-13: 1 [IU] via SUBCUTANEOUS
  Administered 2017-02-13: 2 [IU] via SUBCUTANEOUS

## 2017-02-12 MED ORDER — LEVALBUTEROL HCL 1.25 MG/0.5ML IN NEBU
1.2500 mg | INHALATION_SOLUTION | Freq: Three times a day (TID) | RESPIRATORY_TRACT | Status: DC
  Administered 2017-02-12 – 2017-02-13 (×2): 1.25 mg via RESPIRATORY_TRACT
  Filled 2017-02-12: qty 0.5

## 2017-02-12 MED ORDER — ALBUTEROL SULFATE (2.5 MG/3ML) 0.083% IN NEBU
3.0000 mL | INHALATION_SOLUTION | Freq: Four times a day (QID) | RESPIRATORY_TRACT | Status: DC
Start: 2017-02-12 — End: 2017-02-12

## 2017-02-12 NOTE — Progress Notes (Signed)
RT NOTE:  Pt states she was told to wear CPAP @ home, however, she does not do this. Pt does not want to wear CPAP tonight. She wants to continue with 3L [place]. Pt tolerating [place] well @ this time. Pt understands to call RN for RT if she changes her mind about using CPAP.

## 2017-02-12 NOTE — Plan of Care (Signed)
Problem: Safety: Goal: Ability to remain free from injury will improve Outcome: Progressing Bed alarm implemented. Patient educated to call for assistance prior to ambulation. Patient verbalizes understanding

## 2017-02-12 NOTE — Discharge Instructions (Addendum)
Atrial Fibrillation Atrial fibrillation is a type of irregular or rapid heartbeat (arrhythmia). In atrial fibrillation, the heart quivers continuously in a chaotic pattern. This occurs when parts of the heart receive disorganized signals that make the heart unable to pump blood normally. This can increase the risk for stroke, heart failure, and other heart-related conditions. There are different types of atrial fibrillation, including:  Paroxysmal atrial fibrillation. This type starts suddenly, and it usually stops on its own shortly after it starts.  Persistent atrial fibrillation. This type often lasts longer than a week. It may stop on its own or with treatment.  Long-lasting persistent atrial fibrillation. This type lasts longer than 12 months.  Permanent atrial fibrillation. This type does not go away.  Talk with your health care provider to learn about the type of atrial fibrillation that you have. What are the causes? This condition is caused by some heart-related conditions or procedures, including:  A heart attack.  Coronary artery disease.  Heart failure.  Heart valve conditions.  High blood pressure.  Inflammation of the sac that surrounds the heart (pericarditis).  Heart surgery.  Certain heart rhythm disorders, such as Wolf-Parkinson-White syndrome.  Other causes include:  Pneumonia.  Obstructive sleep apnea.  Blockage of an artery in the lungs (pulmonary embolism, or PE).  Lung cancer.  Chronic lung disease.  Thyroid problems, especially if the thyroid is overactive (hyperthyroidism).  Caffeine.  Excessive alcohol use or illegal drug use.  Use of some medicines, including certain decongestants and diet pills.  Sometimes, the cause cannot be found. What increases the risk? This condition is more likely to develop in:  People who are older in age.  People who smoke.  People who have diabetes mellitus.  People who are overweight  (obese).  Athletes who exercise vigorously.  What are the signs or symptoms? Symptoms of this condition include:  A feeling that your heart is beating rapidly or irregularly.  A feeling of discomfort or pain in your chest.  Shortness of breath.  Sudden light-headedness or weakness.  Getting tired easily during exercise.  In some cases, there are no symptoms. How is this diagnosed? Your health care provider may be able to detect atrial fibrillation when taking your pulse. If detected, this condition may be diagnosed with:  An electrocardiogram (ECG).  A Holter monitor test that records your heartbeat patterns over a 24-hour period.  Transthoracic echocardiogram (TTE) to evaluate how blood flows through your heart.  Transesophageal echocardiogram (TEE) to view more detailed images of your heart.  A stress test.  Imaging tests, such as a CT scan or chest X-ray.  Blood tests.  How is this treated? The main goals of treatment are to prevent blood clots from forming and to keep your heart beating at a normal rate and rhythm. The type of treatment that you receive depends on many factors, such as your underlying medical conditions and how you feel when you are experiencing atrial fibrillation. This condition may be treated with:  Medicine to slow down the heart rate, bring the hearts rhythm back to normal, or prevent clots from forming.  Electrical cardioversion. This is a procedure that resets your hearts rhythm by delivering a controlled, low-energy shock to the heart through your skin.  Different types of ablation, such as catheter ablation, catheter ablation with pacemaker, or surgical ablation. These procedures destroy the heart tissues that send abnormal signals. When the pacemaker is used, it is placed under your skin to help your heart beat in  a regular rhythm.  Follow these instructions at home:  Take over-the counter and prescription medicines only as told by your  health care provider.  If your health care provider prescribed a blood-thinning medicine (anticoagulant), take it exactly as told. Taking too much blood-thinning medicine can cause bleeding. If you do not take enough blood-thinning medicine, you will not have the protection that you need against stroke and other problems.  Do not use tobacco products, including cigarettes, chewing tobacco, and e-cigarettes. If you need help quitting, ask your health care provider.  If you have obstructive sleep apnea, manage your condition as told by your health care provider.  Do not drink alcohol.  Do not drink beverages that contain caffeine, such as coffee, soda, and tea.  Maintain a healthy weight. Do not use diet pills unless your health care provider approves. Diet pills may make heart problems worse.  Follow diet instructions as told by your health care provider.  Exercise regularly as told by your health care provider.  Keep all follow-up visits as told by your health care provider. This is important. How is this prevented?  Avoid drinking beverages that contain caffeine or alcohol.  Avoid certain medicines, especially medicines that are used for breathing problems.  Avoid certain herbs and herbal medicines, such as those that contain ephedra or ginseng.  Do not use illegal drugs, such as cocaine and amphetamines.  Do not smoke.  Manage your high blood pressure. Contact a health care provider if:  You notice a change in the rate, rhythm, or strength of your heartbeat.  You are taking an anticoagulant and you notice increased bruising.  You tire more easily when you exercise or exert yourself. Get help right away if:  You have chest pain, abdominal pain, sweating, or weakness.  You feel nauseous.  You notice blood in your vomit, bowel movement, or urine.  You have shortness of breath.  You suddenly have swollen feet and ankles.  You feel dizzy.  You have sudden weakness or  numbness of the face, arm, or leg, especially on one side of the body.  You have trouble speaking, trouble understanding, or both (aphasia).  Your face or your eyelid droops on one side. These symptoms may represent a serious problem that is an emergency. Do not wait to see if the symptoms will go away. Get medical help right away. Call your local emergency services (911 in the [place] Do not drive yourself to the hospital. This information is not intended to replace advice given to you by your health care provider. Make sure you discuss any questions you have with your health care provider. Document Released: 04/07/2005 Document Revised: 08/15/2015 Document Reviewed: 08/02/2014 Elsevier Interactive Patient Education  2017 [place] on my medicine - ELIQUIS (apixaban)  This medication education was reviewed with me or my healthcare representative as part of my discharge preparation.  The pharmacist that spoke with me during my hospital stay was:  Wayland Salinas, [facility]  Why was Eliquis prescribed for you? Eliquis was prescribed for you to reduce the risk of a blood clot forming that can cause a stroke if you have a medical condition called atrial fibrillation (a type of irregular heartbeat).  What do You need to know about Eliquis ? Take your Eliquis TWICE DAILY - one tablet in the morning and one tablet in the evening with or without food. If you have difficulty swallowing the tablet whole please discuss with your pharmacist how to take  the medication safely.  Take Eliquis exactly as prescribed by your doctor and DO NOT stop taking Eliquis without talking to the doctor who prescribed the medication.  Stopping may increase your risk of developing a stroke.  Refill your prescription before you run out.  After discharge, you should have regular check-up appointments with your healthcare provider that is prescribing your Eliquis.  In the future your dose may  need to be changed if your kidney function or weight changes by a significant amount or as you get older.  What do you do if you miss a dose? If you miss a dose, take it as soon as you remember on the same day and resume taking twice daily.  Do not take more than one dose of ELIQUIS at the same time to make up a missed dose.  Important Safety Information A possible side effect of Eliquis is bleeding. You should call your healthcare provider right away if you experience any of the following: ? Bleeding from an injury or your nose that does not stop. ? Unusual colored urine (red or dark brown) or unusual colored stools (red or black). ? Unusual bruising for unknown reasons. ? A serious fall or if you hit your head (even if there is no bleeding).  Some medicines may interact with Eliquis and might increase your risk of bleeding or clotting while on Eliquis. To help avoid this, consult your healthcare provider or pharmacist prior to using any new prescription or non-prescription medications, including herbals, vitamins, non-steroidal anti-inflammatory drugs (NSAIDs) and supplements.  This website has more information on Eliquis (apixaban): http://www.eliquis.com/eliquis/home

## 2017-02-12 NOTE — Progress Notes (Addendum)
PROGRESS NOTE    Lorraine Leonard  EXB:[id number] DOB: 07/22/1940 DOA: 02/09/2017 PCP: Sharion Balloon, FNP   Brief Narrative:  76 y.o. 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  4 days prior to admission patient was at the heart clinic to get weighed and get an EKG.  She says her weight was up 12 lbs from the past two weeks and her heart rate was in the 40s.  She was told to stop her Toprol but continue her diltiazem.  She was to follow up today at the cardiology clinic.  At follow up today she had palpitations (which started yesterday) and fatigue.  Weight decreased from 253 to 242 over the past four days.  Today patient reports she feels very weak.  No fevers, chills.  Patient reports dyspnea, and minimal chest pain (wears 3L [place] at baseline). No dysuria or hematuria.  No diarrhea or constipation.     Subjective: 10/25 A/O 4, negative CP, positive chronic SOB, negative abdominal pain, negative N/V.     Assessment & Plan:   Principal Problem:   Atrial fibrillation ([place]) Active Problems:   Essential hypertension   GASTROESOPHAGEAL REFLUX DISEASE   Hypothyroidism   Hyperlipemia   Diabetes mellitus (HCC)   Pulmonary fibrosis (HCC)   CKD (chronic kidney disease) stage 2, GFR 60-89 ml/min   Chest pain due to myocardial ischemia   Hypokalemia   Atrial fibrillation -Cardizem 300 mg daily -Eliquis 5 mg BID  -Spironolactone 25 mg daily   Chest pain -See A. fib    Chronic UTI -Bactrim per her urologist: Discontinued   Leukocytosis -  UA, blood culture pending - afebrile today. Inflammation vs true infection. Does not appear to be true infection but will await cultures. Continue to hold  antibiotic - patient on chronic prednisone at baseline  - CXR does not show evidence of pneumonia   Hypothyroidism  - TSH elevated: Obtain free T4 -continue synthroid 100 g daily   HLD -Crestor 10 mg daily -Lipid panel pending   DM Type 2 uncontrolled with Peripheral Neuropathy -10/23 Hemoglobin A1c= 7.1 -sensitive SSI   Pulmonary Fibrosis/Chronic Respiratory Failure with Hypoxiaon home O2, 3 L - follows with Dr. Lamonte Sakai outpatient - continue chronic prednisone10 mg daily (home dose) -Xopenex TID - currently on home oxygen requirement and breathing is unlabored appears to have [place] understanding of her disease process. Per patient has not seen Dr. Lamonte Sakai in 2 years, consult PCCM on 10/26  Gastric tumor -S/P EGD 08/17/2015: [facility] antral nodule biopsy showed benign gastric mucosa with hyperplastic epithelial changes, negative intestinal metaplasia, significant inflammation or H pylori identified -S/P repeat EGD 08/06/2016 [facility]: Hyperplastic gastric polyp with chronic inactive gastritis, negative H. pylori   CKD Stage 2(baseline Cr ~0.9)   Recent Labs Lab 02/09/17 1327 02/10/17 0633 02/11/17 1001 02/12/17 0420  CREATININE 1.26* 1.34* 1.22* 1.25*  -still not at baseline. Holding Lasix  OSA/OHS -Patient willing to try CPAP while in the hospital.  -Trazodone 25 mg QHS PRN   DVT prophylaxis: SCD  Code Status: Full Family Communication: none Disposition Plan: TBD   Consultants:  Cardiology EP  Procedures/Significant Events:     I have personally reviewed and interpreted all  radiology studies and my findings are as above.  VENTILATOR SETTINGS:    Cultures 10/25 blood pending 10/25 urine pending     Antimicrobials: Anti-infectives    Start     Stop   02/10/17 1000  trimethoprim (TRIMPEX) tablet 100 mg  Status:  Discontinued     02/11/17 1333       Devices    LINES / TUBES:      Continuous Infusions:    Objective: Vitals:    02/12/17 1100 02/12/17 1202 02/12/17 1422 02/12/17 1528  BP:  (!) 114/93 124/68 118/67  Pulse:  82 (!) 111 (!) 104  Resp:  (!) 29 18   Temp: 98 F (36.7 C)     TempSrc: Oral     SpO2:  98% 97% 94%  Weight:      Height:        Intake/Output Summary (Last 24 hours) at 02/12/17 1609 Last data filed at 02/12/17 1400  Gross per 24 hour  Intake             1440 ml  Output              900 ml  Net              540 ml   Filed Weights   02/10/17 0450 02/11/17 0411 02/12/17 0448  Weight: 241 lb 13.5 oz (109.7 kg) 241 lb (109.3 kg) 243 lb 6.4 oz (110.4 kg)    Examination:  General: A/O 4, positive chronic respiratory distress Neck:  Negative scars, masses, torticollis, lymphadenopathy, JVD Lungs: diffuse poor air movement, bibasal crackles, negative wheezing  Cardiovascular: Irregular irregular rhythm and rate, without murmur gallop or rub normal S1 and S2 Abdomen: MORBIDLY OBESE, negative abdominal pain, nondistended, positive soft, bowel sounds, no rebound, no ascites, no appreciable mass Extremities: No significant cyanosis, clubbing, or edema bilateral lower extremities Skin: Negative rashes, lesions, ulcers Psychiatric:  Negative depression, negative anxiety, negative fatigue, negative mania  Central nervous system:  Cranial nerves II through XII intact, tongue/uvula midline, all extremities muscle strength 5/5, sensation intact throughout,negative dysarthria, negative expressive aphasia, negative receptive aphasia.  .     Data Reviewed: Care during the described time interval was provided by me .  I have reviewed this patient's available data, including medical history, events of note, physical examination, and all test results as part of my evaluation.   CBC:  Recent Labs Lab 02/09/17 1327 02/10/17 0633  WBC 18.0* 12.8*  NEUTROABS 15.2*  --   HGB 14.0 12.6  HCT 44.0 40.2  MCV 92.4 92.6  PLT 301 175   Basic Metabolic Panel:  Recent Labs Lab 02/09/17 1327  02/10/17 0633 02/11/17 1001 02/12/17 0420  NA 142 139 139 142  K 3.6 3.0* 4.0 4.7  CL 94* 93* 101 106  CO2 35* 34* 29 29  GLUCOSE 185* 175* 202* 137*  BUN 19 20 15 16   CREATININE 1.26* 1.34* 1.22* 1.25*  CALCIUM 10.2 9.4 9.4 9.1  MG 1.9  --  2.0 2.0   GFR: Estimated Creatinine Clearance: 49.9 mL/min (A) (by C-G formula based on SCr of 1.25 mg/dL (H)). Liver Function Tests: No results for input(s): AST, ALT, ALKPHOS, BILITOT, PROT, ALBUMIN in the last 168 hours. No results for input(s): LIPASE, AMYLASE in the last 168 hours. No results for input(s): AMMONIA in the last 168 hours. Coagulation Profile: No results for input(s): INR, PROTIME in the last 168 hours. Cardiac Enzymes:  Recent Labs Lab 02/09/17 1327 02/09/17  1916 02/10/17 0044 02/10/17 0633  TROPONINI 0.04* 0.03* 0.03* 0.03*   BNP (last 3 results) No results for input(s): PROBNP in the last 8760 hours. HbA1C:  Recent Labs  02/10/17 0633  HGBA1C 7.1*   CBG: No results for input(s): GLUCAP in the last 168 hours. Lipid Profile: No results for input(s): CHOL, HDL, LDLCALC, TRIG, CHOLHDL, LDLDIRECT in the last 72 hours. Thyroid Function Tests:  Recent Labs  02/11/17 1448  TSH 4.648*   Anemia Panel: No results for input(s): VITAMINB12, FOLATE, FERRITIN, TIBC, IRON, RETICCTPCT in the last 72 hours. Urine analysis:    Component Value Date/Time   COLORURINE YELLOW 02/09/2017 2320   APPEARANCEUR CLEAR 02/09/2017 2320   LABSPEC 1.011 02/09/2017 2320   PHURINE 8.0 02/09/2017 2320   GLUCOSEU NEGATIVE 02/09/2017 2320   HGBUR NEGATIVE 02/09/2017 2320   BILIRUBINUR NEGATIVE 02/09/2017 2320   BILIRUBINUR neg 04/06/2015 1517   KETONESUR NEGATIVE 02/09/2017 2320   PROTEINUR NEGATIVE 02/09/2017 2320   UROBILINOGEN negative 04/06/2015 1517   UROBILINOGEN 0.2 05/14/2011 1942   NITRITE NEGATIVE 02/09/2017 2320   LEUKOCYTESUR NEGATIVE 02/09/2017 2320   Sepsis  Labs: @LABRCNTIP (procalcitonin:4,lacticidven:4)  ) Recent Results (from the past 240 hour(s))  MRSA PCR Screening     Status: None   Collection Time: 02/09/17  6:38 PM  Result Value Ref Range Status   MRSA by PCR NEGATIVE NEGATIVE Final    Comment:        The GeneXpert MRSA Assay (FDA approved for NASAL specimens only), is one component of a comprehensive MRSA colonization surveillance program. It is not intended to diagnose MRSA infection nor to guide or monitor treatment for MRSA infections.          Radiology Studies: No results found.      Scheduled Meds: . apixaban  5 mg Oral BID  . azelastine  2 spray Each Nare BID  . cetirizine  10 mg Oral QPM  . diltiazem  300 mg Oral Daily  . ferrous sulfate  325 mg Oral BID WC  . levothyroxine  100 mcg Oral QAC breakfast  . mometasone   Topical Daily  . pantoprazole  40 mg Oral Daily  . rosuvastatin  10 mg Oral QHS  . spironolactone  25 mg Oral Daily   Continuous Infusions:    LOS: 3 days    Time spent: 40 minutes    Kendarious Gudino, Geraldo Docker, MD [facility] Pager [phone]   If 7PM-7AM, please contact night-coverage [email or web address] Password [email or web address] 02/12/2017, 4:09 PM

## 2017-02-12 NOTE — Progress Notes (Signed)
Progress Note  Patient Name: Lorraine Leonard Date of Encounter: 02/12/2017  Primary Cardiologist: Dr. Harl Bowie  Subjective   Feels quite well today, slept very well, OOB eating lunch.  No CP, palpitations or SOB  Inpatient Medications    Scheduled Meds: . apixaban  5 mg Oral BID  . azelastine  2 spray Each Nare BID  . cetirizine  10 mg Oral QPM  . ferrous sulfate  325 mg Oral BID WC  . levothyroxine  100 mcg Oral QAC breakfast  . mometasone   Topical Daily  . pantoprazole  40 mg Oral Daily  . rosuvastatin  10 mg Oral QHS  . spironolactone  25 mg Oral Daily   Continuous Infusions: . diltiazem (CARDIZEM) infusion 15 mg/hr (02/12/17 1044)   PRN Meds: acetaminophen **OR** acetaminophen, albuterol, diazepam, docusate sodium, fluticasone, ondansetron **OR** ondansetron (ZOFRAN) IV, polyethylene glycol, traMADol, traZODone   Vital Signs    Vitals:   02/12/17 0100 02/12/17 0448 02/12/17 0700 02/12/17 0725  BP: (!) 124/49 138/73  128/79  Pulse: (!) 107 (!) 128  (!) 103  Resp: (!) 21 19  (!) 27  Temp: 98.2 F (36.8 C) 98.6 F (37 C) 98.3 F (36.8 C)   TempSrc: Oral Axillary Oral   SpO2: 97% 98%  98%  Weight:  243 lb 6.4 oz (110.4 kg)    Height:        Intake/Output Summary (Last 24 hours) at 02/12/17 1123 Last data filed at 02/12/17 0626  Gross per 24 hour  Intake             1400 ml  Output             1300 ml  Net              100 ml   Filed Weights   02/10/17 0450 02/11/17 0411 02/12/17 0448  Weight: 241 lb 13.5 oz (109.7 kg) 241 lb (109.3 kg) 243 lb 6.4 oz (110.4 kg)    Telemetry    AFib 80's-100 - Personally Reviewed  ECG    AFib 70bpm, 446ms - Personally Reviewed  Physical Exam   GEN: No acute distress.   Neck: No JVD Cardiac: IRRR, no murmurs, rubs, or gallops.  Respiratory: CTA b/l. GI: Soft, nontender, non-distended  MS: No edema; No deformity. Neuro:  Nonfocal  Psych: Normal affect   Labs    Chemistry Recent Labs Lab 02/10/17 0633  02/11/17 1001 02/12/17 0420  NA 139 139 142  K 3.0* 4.0 4.7  CL 93* 101 106  CO2 34* 29 29  GLUCOSE 175* 202* 137*  BUN 20 15 16   CREATININE 1.34* 1.22* 1.25*  CALCIUM 9.4 9.4 9.1  GFRNONAA 37* 42* 41*  GFRAA 43* 49* 47*  ANIONGAP 12 9 7      Hematology Recent Labs Lab 02/09/17 1327 02/10/17 0633  WBC 18.0* 12.8*  RBC 4.76 4.34  HGB 14.0 12.6  HCT 44.0 40.2  MCV 92.4 92.6  MCH 29.4 29.0  MCHC 31.8 31.3  RDW 15.3 15.4  PLT 301 257    Cardiac Enzymes Recent Labs Lab 02/09/17 1327 02/09/17 1916 02/10/17 0044 02/10/17 0633  TROPONINI 0.04* 0.03* 0.03* 0.03*   No results for input(s): TROPIPOC in the last 168 hours.   BNPNo results for input(s): BNP, PROBNP in the last 168 hours.   DDimer No results for input(s): DDIMER in the last 168 hours.   Radiology    No results found.  Cardiac Studies   02/10/17:  TTE Study Conclusions - Left ventricle: The cavity size was normal. Wall thickness was increased in a pattern of moderate LVH. Systolic function was normal. The estimated ejection fraction was in the range of 55% to 60%. Wall motion was normal; there were no regional wall motion abnormalities. - Mitral valve: Mildly calcified leaflets . There was trivial regurgitation. - Right ventricle: Systolic function was low normal. - Right atrium: Central venous pressure (est): 3 mm Hg. - Tricuspid valve: There was mild regurgitation. - Pulmonary arteries: PA peak pressure: 26 mm Hg (S). - Pericardium, extracardiac: There was no pericardial effusion. Impressions: - Moderate LVH with LVEF 55-60% and indeterminate diastolic function in the setting of atrial fibrillation. Mildly calcified mitral annulus with trivial mitral regurgitation. Mild tricuspid regurgitation with PASP 26 mmHg.  Patient Profile     76 y.o. female hx of recently new finding of AFib in July of this year with bradiacardia historically requiring discontinuation of BB, chronic  diastolic CHF, pulmonary fibrosis w/chronic resp insufficieny, COPD, DM, HTN, HLD, RA on chronic steroids, admitted to [facility] with out patient observations of bradycardia 40's/rapid Afib 130's, transferred to [facility] for further managet with EP support.  Assessment & Plan    1. AFib w/RVR, sinus bradycardia     Symptomatic with both     Continue diltiazem gtt for now     CHA2DS2Vasc is 4, on Eliquis  chronic hypoxic resp failure, pulmonary fibrosis, in review of pulm notes, stong suspicion of OSA though patient declined testing, obesity all play a role likley to make rhythm control difficult though has not been bother by AFib for long that we know of.  LA measured 47mm  After much discussion yesterday with Dr. Caryl Comes, plan was to stop her trimethoprim given no other good AAD options for her other then Tikosyn.  He Eberardo Demello reach out to her urologist.   Her last dose of this was yesterday AM at 0959 (36 hour wait Larene Ascencio be this evening) Last dose of Flecainide was 02/10/17 @0928  (48hour wait is completed) Appreciate Pharmacist aid and recommendations, no other contraindicated medicines were noted.  Unfortunately the patient can not be certain that she has been completely compliant with her a/c at home.  She feels it is more then likely that she has missed a dose or 2 here/there (we discussed the importance of this)   Also, she remarks that she is planned early November to have repeat EGD to re-assess after a "tumor removal".  This raises further concerns given if we cardiovert her (after TEE with Tikosyn or DCCV) she can not hold her a/c for 4 weeks.  She may best be served to have rate control (at lower doses then her previus regime to avoid brady) at this point and revisit Tikosyn initiation afterwards.  K+ 4.7 Mag 2.0 Creat 1.25 (calc creat cl = 67) QTc today is 458ms 01/30/17 EKG is SB, QTc was 601UX  Very complicated, Francie Keeling discuss case with Dr. Curt Bears, he Keaja Reaume see later today  2. Chronic  Diastolic CHF     I/O appear euvolemic, exam currently does not suggest fluid OL, CXR w/o pulmonary edema or efussion     Weight in-patient stable (down 1lb)     There is question as to where her actual dry weight is  3. CP      No known hx of CAD     Neg Trop     Not active or ongoing   c/w IM service:: 4. Pulmonary Fibrosis 5. RA  6 Chronic UTI on ABx suppression  7.  Hypothyroidism, treated  TSH 4>>2 Keziah Drotar recheck    For questions or updates, please contact [place] Please consult [email or web address] for contact info under Cardiology/STEMI.      Signed, Baldwin Jamaica, PA-C  02/12/2017, 11:23 AM    I have seen and examined this patient with Tommye Standard.  Agree with above, note added to reflect my findings.  On exam, RRR, no murmurs, lungs clear. Remains in atrial fibrillation. She states that she has missed at least one dose of eliquis this past week. Due to that, Zyniah Ferraiolo hold off on rhythm control with Tikosyn. Jeffery Bachmeier plan on rate control with diltiazem at this time. She has a GI procedure upcoming and Che Below potentially need to hold anticoagulation for that, and thus would discuss rhythm control post endoscopy..    Deontez Klinke M. Boone Gear MD 02/12/2017 5:09 PM

## 2017-02-13 LAB — LIPID PANEL
Cholesterol: 119 mg/dL (ref 0–200)
HDL: 39 mg/dL — ABNORMAL LOW (ref >40)
LDL Cholesterol: 56 mg/dL (ref 0–99)
Total CHOL/HDL Ratio: 3.1 RATIO
Triglycerides: 121 mg/dL (ref <150)
VLDL: 24 mg/dL (ref 0–40)

## 2017-02-13 LAB — BASIC METABOLIC PANEL
Anion gap: 9 (ref 5–15)
BUN: 14 mg/dL (ref 6–20)
CO2: 27 mmol/L (ref 22–32)
Calcium: 9.6 mg/dL (ref 8.9–10.3)
Chloride: 104 mmol/L (ref 101–111)
Creatinine, Ser: 1.28 mg/dL — ABNORMAL HIGH (ref 0.44–1.00)
GFR calc Af Amer: 46 mL/min — ABNORMAL LOW (ref >60)
GFR calc non Af Amer: 40 mL/min — ABNORMAL LOW (ref >60)
Glucose, Bld: 171 mg/dL — ABNORMAL HIGH (ref 65–99)
Potassium: 4.5 mmol/L (ref 3.5–5.1)
Sodium: 140 mmol/L (ref 135–145)

## 2017-02-13 LAB — MAGNESIUM: Magnesium: 2.2 mg/dL (ref 1.7–2.4)

## 2017-02-13 LAB — GLUCOSE, CAPILLARY
Glucose-Capillary: 125 mg/dL — ABNORMAL HIGH (ref 65–99)
Glucose-Capillary: 156 mg/dL — ABNORMAL HIGH (ref 65–99)
Glucose-Capillary: 158 mg/dL — ABNORMAL HIGH (ref 65–99)
Glucose-Capillary: 162 mg/dL — ABNORMAL HIGH (ref 65–99)
Glucose-Capillary: 173 mg/dL — ABNORMAL HIGH (ref 65–99)
Glucose-Capillary: 226 mg/dL — ABNORMAL HIGH (ref 65–99)

## 2017-02-13 MED ORDER — POLYETHYLENE GLYCOL 3350 17 G PO PACK: 17.0000 g | PACK | Freq: Every day | ORAL | Status: DC | PRN

## 2017-02-13 MED ORDER — METOPROLOL SUCCINATE ER 25 MG PO TB24
25.0000 mg | ORAL_TABLET | Freq: Every day | ORAL | Status: DC
  Administered 2017-02-13 – 2017-02-14 (×2): 25 mg via ORAL
  Filled 2017-02-13 (×2): qty 1

## 2017-02-13 MED ORDER — INSULIN ASPART 100 UNIT/ML ~~LOC~~ SOLN
0.0000 [IU] | Freq: Three times a day (TID) | SUBCUTANEOUS | Status: DC
  Administered 2017-02-13 – 2017-02-14 (×3): 2 [IU] via SUBCUTANEOUS
  Administered 2017-02-14 – 2017-02-15 (×2): 1 [IU] via SUBCUTANEOUS
  Administered 2017-02-15 (×2): 2 [IU] via SUBCUTANEOUS
  Administered 2017-02-16: 1 [IU] via SUBCUTANEOUS
  Administered 2017-02-16: 3 [IU] via SUBCUTANEOUS

## 2017-02-13 MED ORDER — LEVALBUTEROL HCL 1.25 MG/0.5ML IN NEBU: 1.2500 mg | INHALATION_SOLUTION | RESPIRATORY_TRACT | Status: DC | PRN

## 2017-02-13 MED ORDER — DILTIAZEM HCL ER COATED BEADS 180 MG PO CP24
180.0000 mg | ORAL_CAPSULE | Freq: Two times a day (BID) | ORAL | Status: DC
  Administered 2017-02-13 – 2017-02-14 (×3): 180 mg via ORAL
  Filled 2017-02-13 (×3): qty 1

## 2017-02-13 NOTE — Progress Notes (Signed)
Pt ambulated up the hallway with oxygen. Pt's HR went into the 140's. Pt stated she felt very weak and shaky. Pt brought back to bed. While resting in bed HR in the 100's-120's. Will cont to monitor pt.

## 2017-02-13 NOTE — Progress Notes (Signed)
Resting HR's have been generally 90's this afternoon, her second ambulation RN reported she did better, able to go further HR's got into 102's, though recovered much quicker with rest then this morning and patient did not feel as tired.  Discussed with Dr. Caryl Comes, will continue Ditiazem 180mg  BID and Toprol XL 25mg  daily, OK to discharge from our perspective, early f/u has been arranged for next week.  I have asked RN to make attending service aware.  Tommye Standard, PA-C

## 2017-02-13 NOTE — Care Management Important Message (Signed)
Important Message  Patient Details  Name: Lorraine Leonard MRN: [id number] Date of Birth: 10/26/1940   Medicare Important Message Given:  Yes    [name] 02/13/2017, 10:45 AM

## 2017-02-13 NOTE — Plan of Care (Signed)
Problem: Skin Integrity: Goal: Risk for impaired skin integrity will decrease Outcome: Progressing Patient educated on need to reposition frequently and maintain clean, dry skin to promote skin integrity. Patient verbalizes understanding of education

## 2017-02-13 NOTE — Progress Notes (Signed)
[facility] TEAM 1 - Stepdown/ICU TEAM  TRAVIS PURK  ATF:[id number] DOB: 1940/05/15 DOA: 02/09/2017 PCP: Sharion Balloon, FNP    Brief Narrative:  76 y.o.F Hx Anxiety, Depression, Acute diastolic CHF,  Atrial fibrillation, HTN, COPD, Chronic Hypoxic Respiratory Failure on 3 L O2, OSA, DM2 with peripheral neuropathy, HLD, and Hypothyroidism who was sent to the ED from [place] [facility] after being found to be in atrial fibrillation with rapid ventricular response. 4 days prior to admission patient was at the [place] clinic to get weighed and get an EKG. She says her weight was up 12 lbs from the past two weeks and her heart rate was in the 40s. She was told to stop her Toprol but continue her diltiazem.   Subjective: Pt tells me she feels very tired in general.  She does not feel steady on her feet.  She denies cp, n/v, or sob.    Assessment & Plan:  Atrial fibrillation w/ RVR - Sinus brady Care as per cardiology - medications currently being titrated - Chadsvasc is 4 - on eliquis   Chest pain Denies chest pain at this time - no known history of coronary artery disease  Chronic UTI Chronic Bactrim discontinued due to interference w/ use of antiarrythmic meds   Leukocytosis Afebrile - CXR unremarkable - urine and blood cx have been sent - hold abx for now   Hypothyroidism  TSH elevated at 4.65 but would not adjust dose in setting of afib w/ RVR - continue synthroid 100 g daily  HLD Cont usual home tx   DM2  10/23 A1c 7.1 - CBG variable - follow w/o change today   Pulmonary Fibrosis/Chronic Hypoxic Respiratory Failure > home O2 3 L follows with Dr. Lamonte Sakai - continue chronic prednisone10 mg daily (home dose) - appears compensated at this time   Gastric hyperplastic polyp EGD 08/17/2015 by [facility]:  antral nodule biopsy showed benign gastric mucosa with hyperplastic epithelial changes, negative intestinal metaplasia significant inflammation or H pylori identified -  repeat EGD 08/06/2016 [facility]: Hyperplastic gastric polyp with chronic inactive gastritis, negative H. pylori  CKD Stage 2 baseline Cr ~0.9  Recent Labs Lab 02/09/17 1327 02/10/17 0633 02/11/17 1001 02/12/17 0420 02/13/17 0430  CREATININE 1.26* 1.34* 1.22* 1.25* 1.28*    OSA/OHS Reportedly willing to try CPAP while in the hospital but then refused when RT tried to place - trazodone 25 mg QHS PRN   DVT prophylaxis: full anticoag Code Status: FULL CODE Family Communication: no family present at time of exam  Disposition Plan:   Consultants:  [facility] Cards   Antimicrobials:  none   Objective: Blood pressure 116/88, pulse (!) 107, temperature 97.8 F (36.6 C), temperature source Oral, resp. rate 20, height 5\' 8"  (1.727 m), weight 109.6 kg (241 lb 11.2 oz), SpO2 97 %.  Intake/Output Summary (Last 24 hours) at 02/13/17 1359 Last data filed at 02/13/17 1100  Gross per 24 hour  Intake            643.5 ml  Output                0 ml  Net            643.5 ml   Filed Weights   02/11/17 0411 02/12/17 0448 02/13/17 0404  Weight: 109.3 kg (241 lb) 110.4 kg (243 lb 6.4 oz) 109.6 kg (241 lb 11.2 oz)    Examination: General: No acute respiratory distress Lungs: Clear to auscultation bilaterally  without wheezes or crackles Cardiovascular: irreg irreg - HR 100 Abdomen: Nontender, nondistended, soft, bowel sounds positive, no rebound, no ascites, no appreciable mass Extremities: No significant cyanosis, clubbing, or edema bilateral lower extremities  CBC:  Recent Labs Lab 02/09/17 1327 02/10/17 0633  WBC 18.0* 12.8*  NEUTROABS 15.2*  --   HGB 14.0 12.6  HCT 44.0 40.2  MCV 92.4 92.6  PLT 301 096   Basic Metabolic Panel:  Recent Labs Lab 02/09/17 1327 02/10/17 0633 02/11/17 1001 02/12/17 0420 02/13/17 0430  NA 142 139 139 142 140  K 3.6 3.0* 4.0 4.7 4.5  CL 94* 93* 101 106 104  CO2 35* 34* 29 29 27   GLUCOSE 185* 175* 202* 137* 171*  BUN 19 20 15 16 14     CREATININE 1.26* 1.34* 1.22* 1.25* 1.28*  CALCIUM 10.2 9.4 9.4 9.1 9.6  MG 1.9  --  2.0 2.0 2.2   GFR: Estimated Creatinine Clearance: 48.5 mL/min (A) (by C-G formula based on SCr of 1.28 mg/dL (H)).  Liver Function Tests: No results for input(s): AST, ALT, ALKPHOS, BILITOT, PROT, ALBUMIN in the last 168 hours. No results for input(s): LIPASE, AMYLASE in the last 168 hours. No results for input(s): AMMONIA in the last 168 hours.   Cardiac Enzymes:  Recent Labs Lab 02/09/17 1327 02/09/17 1916 02/10/17 0044 02/10/17 0633  TROPONINI 0.04* 0.03* 0.03* 0.03*    HbA1C: Hemoglobin A1C  Date/Time Value Ref Range Status  03/01/2015 03:19 PM 6.1  Final    Comment:    4.8-5.6% normal range   Hgb A1c MFr Bld  Date/Time Value Ref Range Status  02/10/2017 06:33 AM 7.1 (H) 4.8 - 5.6 % Final    Comment:    (NOTE)         Prediabetes: 5.7 - 6.4         Diabetes: >6.4         Glycemic control for adults with diabetes: <7.0     CBG:  Recent Labs Lab 02/12/17 2024 02/13/17 0014 02/13/17 0409 02/13/17 0750 02/13/17 1133  GLUCAP 150* 125* 158* 173* 226*    Recent Results (from the past 240 hour(s))  MRSA PCR Screening     Status: None   Collection Time: 02/09/17  6:38 PM  Result Value Ref Range Status   MRSA by PCR NEGATIVE NEGATIVE Final    Comment:        The GeneXpert MRSA Assay (FDA approved for NASAL specimens only), is one component of a comprehensive MRSA colonization surveillance program. It is not intended to diagnose MRSA infection nor to guide or monitor treatment for MRSA infections.      Scheduled Meds: . apixaban  5 mg Oral BID  . azelastine  2 spray Each Nare BID  . cetirizine  10 mg Oral QPM  . diltiazem  180 mg Oral BID  . ferrous sulfate  325 mg Oral BID WC  . insulin aspart  0-9 Units Subcutaneous Q4H  . levothyroxine  100 mcg Oral QAC breakfast  . metoprolol succinate  25 mg Oral Daily  . mometasone   Topical Daily  . pantoprazole  40  mg Oral Daily  . predniSONE  10 mg Oral Q breakfast  . rosuvastatin  10 mg Oral QHS  . spironolactone  25 mg Oral Daily     LOS: 4 days   Cherene Altes, MD [facility] Office  [phone] Pager - Text Page per Amion as per below:  On-Call/Text Page:  [email or web address]      password [email or web address]  If 7PM-7AM, please contact night-coverage [email or web address] Password [email or web address] 02/13/2017, 1:59 PM

## 2017-02-13 NOTE — Progress Notes (Signed)
Pt ambulated in the hallway. Hr went into the 120's. Pt currently resting in bed. Hr in the low 100's. Will cont to monitor pt.

## 2017-02-13 NOTE — Progress Notes (Signed)
RT NOTE:  Pt refuses CPAP tonight. She does not wear at home and wishes to only wear 3L [place] while she is sleeping. Pt understands to have RN contact RT if she changes her mind.

## 2017-02-13 NOTE — Progress Notes (Signed)
Progress Note  Patient Name: Lorraine Leonard Date of Encounter: 02/13/2017  Primary Cardiologist: Dr. Harl Bowie  Subjective   Got tired and weak quickly with ambulation today, no CP.    Inpatient Medications    Scheduled Meds: . apixaban  5 mg Oral BID  . azelastine  2 spray Each Nare BID  . cetirizine  10 mg Oral QPM  . diltiazem  180 mg Oral BID  . ferrous sulfate  325 mg Oral BID WC  . insulin aspart  0-9 Units Subcutaneous Q4H  . levothyroxine  100 mcg Oral QAC breakfast  . metoprolol succinate  25 mg Oral Daily  . mometasone   Topical Daily  . pantoprazole  40 mg Oral Daily  . predniSONE  10 mg Oral Q breakfast  . rosuvastatin  10 mg Oral QHS  . spironolactone  25 mg Oral Daily   Continuous Infusions:  PRN Meds: acetaminophen **OR** acetaminophen, diazepam, docusate sodium, fluticasone, levalbuterol, ondansetron **OR** ondansetron (ZOFRAN) IV, polyethylene glycol, traMADol, traZODone   Vital Signs    Vitals:   02/12/17 2341 02/13/17 0404 02/13/17 0817 02/13/17 0931  BP: 128/69 134/72 116/88   Pulse: (!) 114 (!) 111 (!) 107   Resp: (!) 22 (!) 22 20   Temp: 98 F (36.7 C)  97.8 F (36.6 C)   TempSrc: Oral  Oral   SpO2: 97% 96% 96% 97%  Weight:  241 lb 11.2 oz (109.6 kg)    Height:        Intake/Output Summary (Last 24 hours) at 02/13/17 1216 Last data filed at 02/13/17 1100  Gross per 24 hour  Intake            643.5 ml  Output                0 ml  Net            643.5 ml   Filed Weights   02/11/17 0411 02/12/17 0448 02/13/17 0404  Weight: 241 lb (109.3 kg) 243 lb 6.4 oz (110.4 kg) 241 lb 11.2 oz (109.6 kg)    Telemetry    AFib 80's-100 - Personally Reviewed And afaster with ambulation   ECG    AFib 70bpm, 440ms - Personally Reviewed  Physical Exam   GEN: No acute distress.   Neck: No JVD Cardiac: IRRR tachycardic, no murmurs, rubs, or gallops.  Respiratory: CTA b/l. GI: Soft, nontender, non-distended  MS: No edema; No deformity. Neuro:   Nonfocal  Psych: Normal affect   Labs    Chemistry  Recent Labs Lab 02/11/17 1001 02/12/17 0420 02/13/17 0430  NA 139 142 140  K 4.0 4.7 4.5  CL 101 106 104  CO2 29 29 27   GLUCOSE 202* 137* 171*  BUN 15 16 14   CREATININE 1.22* 1.25* 1.28*  CALCIUM 9.4 9.1 9.6  GFRNONAA 42* 41* 40*  GFRAA 49* 47* 46*  ANIONGAP 9 7 9      Hematology  Recent Labs Lab 02/09/17 1327 02/10/17 0633  WBC 18.0* 12.8*  RBC 4.76 4.34  HGB 14.0 12.6  HCT 44.0 40.2  MCV 92.4 92.6  MCH 29.4 29.0  MCHC 31.8 31.3  RDW 15.3 15.4  PLT 301 257    Cardiac Enzymes  Recent Labs Lab 02/09/17 1327 02/09/17 1916 02/10/17 0044 02/10/17 0633  TROPONINI 0.04* 0.03* 0.03* 0.03*   No results for input(s): TROPIPOC in the last 168 hours.   BNPNo results for input(s): BNP, PROBNP in the last 168 hours.  DDimer No results for input(s): DDIMER in the last 168 hours.   Radiology    No results found.  Cardiac Studies   02/10/17: TTE Study Conclusions - Left ventricle: The cavity size was normal. Wall thickness was increased in a pattern of moderate LVH. Systolic function was normal. The estimated ejection fraction was in the range of 55% to 60%. Wall motion was normal; there were no regional wall motion abnormalities. - Mitral valve: Mildly calcified leaflets . There was trivial regurgitation. - Right ventricle: Systolic function was low normal. - Right atrium: Central venous pressure (est): 3 mm Hg. - Tricuspid valve: There was mild regurgitation. - Pulmonary arteries: PA peak pressure: 26 mm Hg (S). - Pericardium, extracardiac: There was no pericardial effusion. Impressions: - Moderate LVH with LVEF 55-60% and indeterminate diastolic function in the setting of atrial fibrillation. Mildly calcified mitral annulus with trivial mitral regurgitation. Mild tricuspid regurgitation with PASP 26 mmHg.  Patient Profile     77 y.o. female hx of recently new finding of AFib in  July of this year with bradiacardia historically requiring discontinuation of BB, chronic diastolic CHF, pulmonary fibrosis w/chronic resp insufficieny, COPD, DM, HTN, HLD, RA on chronic steroids, admitted to [facility] with out patient observations of bradycardia 40's/rapid Afib 130's, transferred to [facility] for further managet with EP support.  Assessment & Plan    1. AFib w/RVR, sinus bradycardia     Symptomatic with both     Continue diltiazem gtt for now     CHA2DS2Vasc is 4, on Eliquis  From note 10/25 chronic hypoxic resp failure, pulmonary fibrosis, in review of pulm notes, stong suspicion of OSA though patient declined testing, obesity all play a role likley to make rhythm control difficult though has not been bother by AFib for long that we know of.  LA measured 7mm  After much discussion with Dr. Caryl Comes, plan was to stop her trimethoprim given no other good AAD options for her other then Tikosyn.  He will reach out to her urologist.   Her last dose of this was yesterday AM at 0959 Last dose of Flecainide was 02/10/17 @0928   Appreciate Pharmacist aid and recommendations, no other contraindicated medicines were noted.  Unfortunately the patient could not be certain that she has been completely compliant with her a/c at home. She did yesterday in-fact confirm at lest once in the last 2 weeks she had missed for sure one dose  Also, she mentioned that she is planned early November 1st see GI to plan to have repeat EGD to re-assess after a "tumor removal".  (It appears she had polyp removed in June 2018).   This raised further concerns given if we cardiovert her (after TEE with Tikosyn or DCCV now) she can not hold her a/c for 4 weeks.  Felt she may best be served with attempt at rate control (at lower doses then her previous regime to avoid brady) at this point and revisit Tikosyn initiation after GI procedure completed and compliant with a/c.         2. Chronic Diastolic CHF     I/O  appear euvolemic, exam currently does not suggest fluid OL, CXR w/o pulmonary edema or efussion    3. CP      No known hx of CAD    c/w IM service:: 4. Pulmonary Fibrosis 5. RA 6. Chronic UTI on ABx suppression  Held as above   7.  Hypothyroidism, treated  TSH 4>>2>>4.64     For questions  or updates, please contact [place] Please consult [email or web address] for contact info under Cardiology/STEMI.      Signed, Baldwin Jamaica, PA-C  02/13/2017, 12:16 PM    Her HR remain fast.  Given issues of compliance for anticoagulation and her anticipated EGD, it was elected to forego immediate dofetilide and cardioversion  Our challenge now will be reatae control with the realization that what we use for afib WILL BE TOO MUCH AT THE TIME OF CARDIOVERSION   We will add back low dose metoprolol 25 mg with early followup with cardiology for uptitration

## 2017-02-14 LAB — URINE CULTURE: Culture: <10000 — AB

## 2017-02-14 LAB — BASIC METABOLIC PANEL
Anion gap: 9 (ref 5–15)
BUN: 14 mg/dL (ref 6–20)
CO2: 29 mmol/L (ref 22–32)
Calcium: 9.2 mg/dL (ref 8.9–10.3)
Chloride: 103 mmol/L (ref 101–111)
Creatinine, Ser: 1.17 mg/dL — ABNORMAL HIGH (ref 0.44–1.00)
GFR calc Af Amer: 51 mL/min — ABNORMAL LOW (ref >60)
GFR calc non Af Amer: 44 mL/min — ABNORMAL LOW (ref >60)
Glucose, Bld: 151 mg/dL — ABNORMAL HIGH (ref 65–99)
Potassium: 4.2 mmol/L (ref 3.5–5.1)
Sodium: 141 mmol/L (ref 135–145)

## 2017-02-14 LAB — GLUCOSE, CAPILLARY
Glucose-Capillary: 155 mg/dL — ABNORMAL HIGH (ref 65–99)
Glucose-Capillary: 147 mg/dL — ABNORMAL HIGH (ref 65–99)
Glucose-Capillary: 180 mg/dL — ABNORMAL HIGH (ref 65–99)
Glucose-Capillary: 194 mg/dL — ABNORMAL HIGH (ref 65–99)

## 2017-02-14 LAB — CBC
HCT: 38.5 % (ref 36.0–46.0)
Hemoglobin: 12.1 g/dL (ref 12.0–15.0)
MCH: 28.8 pg (ref 26.0–34.0)
MCHC: 31.4 g/dL (ref 30.0–36.0)
MCV: 91.7 fL (ref 78.0–100.0)
Platelets: 262 10*3/uL (ref 150–400)
RBC: 4.2 MIL/uL (ref 3.87–5.11)
RDW: 15.6 % — ABNORMAL HIGH (ref 11.5–15.5)
WBC: 11.9 10*3/uL — ABNORMAL HIGH (ref 4.0–10.5)

## 2017-02-14 MED ORDER — METOPROLOL SUCCINATE ER 50 MG PO TB24
50.0000 mg | ORAL_TABLET | Freq: Every day | ORAL | Status: DC
  Administered 2017-02-15 – 2017-02-16 (×2): 50 mg via ORAL
  Filled 2017-02-14 (×2): qty 1

## 2017-02-14 MED ORDER — DILTIAZEM HCL ER COATED BEADS 180 MG PO CP24
180.0000 mg | ORAL_CAPSULE | Freq: Two times a day (BID) | ORAL | Status: DC
  Administered 2017-02-14 – 2017-02-16 (×4): 180 mg via ORAL
  Filled 2017-02-14 (×4): qty 1

## 2017-02-14 MED ORDER — DILTIAZEM HCL ER COATED BEADS 240 MG PO CP24: 240.0000 mg | ORAL_CAPSULE | Freq: Two times a day (BID) | ORAL | Status: DC

## 2017-02-14 NOTE — Plan of Care (Signed)
Problem: Nutrition: Goal: Adequate nutrition will be maintained Outcome: Completed/Met Date Met: 02/14/17 Consumes 100% of meals. Follows cardiac, carb modified diet

## 2017-02-14 NOTE — Progress Notes (Addendum)
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 4 days prior to admission patient was at the [place] clinic to get weighed and get an EKG. She says her weight was up 12 lbs from the past two weeks and her heart rate was in the 40s. She was told to stop her Toprol but continue her diltiazem.   Subjective: Pt still feels very weak and unstable on her feet.  She has not ambulated beyond the bathroom today.  She denies cp, n/v, or abdom pain.    Assessment & Plan:  Atrial fibrillation w/ RVR - Sinus brady Cardiology has cleared the pt for d/c home - HR remaining 90-120 th/o most of today - Chadsvasc is 4 - on eliquis - adjust CCB and follow  Chest pain - resolved  Denies chest pain at this time - no known history of coronary artery disease  Chronic UTI Chronic Bactrim discontinued due to interference w/ use of antiarrythmic meds - Tikosyn not yet initiated due to concern that pt had not been 100% consistent w/ her anticoag at home - to be re-visited in f/u w/ Cards therefore attempt to remain off abx for now   Leukocytosis Afebrile - CXR unremarkable - urine and blood cx unrevealing - WBC improving - off abx at this time   Hypothyroidism  TSH elevated at 4.65 but would not adjust dose in setting of afib w/ RVR - continue synthroid 100 g daily  HLD Cont usual home tx   DM2  10/23 A1c 7.1 - CBG well controlled at this time   Pulmonary Fibrosis/Chronic Hypoxic Respiratory Failure > home O2 3 L follows with Dr. 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 pylori  CKD Stage 2 baseline Cr ~0.9 - crt approaching baseline   Recent Labs Lab 02/10/17 0633 02/11/17 1001 02/12/17 0420 02/13/17 0430 02/14/17 0445  CREATININE 1.34* 1.22* 1.25* 1.28* 1.17*    OSA/OHS Reportedly willing to try CPAP while in the hospital but then refused when RT tried to place - trazodone 25 mg QHS PRN  Obesity - Body mass index is 36.86 kg/m.  DVT prophylaxis: full anticoag Code Status: FULL CODE Family Communication: spoke w/ husband at bedside at length  Disposition Plan: d/c home 10/28 if ambulation improved and HR controlled   Consultants:  [facility] Cards   Antimicrobials:  none   Objective: Blood pressure (!) 153/81, pulse (!) 108, temperature 98.2 F (36.8 C), temperature source Oral, resp. rate (!) 23, height 5\' 8"  (1.727 m), weight 110 kg (242 lb 6.4 oz), SpO2 97 %.  Intake/Output Summary (Last 24 hours) at 02/14/17 1428 Last data filed at 02/14/17 0800  Gross per 24 hour  Intake              480  ml  Output             1900 ml  Net            -1420 ml   Filed Weights   02/12/17 0448 02/13/17 0404 02/14/17 0631  Weight: 110.4 kg (243 lb 6.4 oz) 109.6 kg (241 lb 11.2 oz) 110 kg (242 lb 6.4 oz)    Examination: General: No acute respiratory distress - alert  Lungs: Clear to auscultation bilaterally - no wheezing or crackles  Cardiovascular: irreg irreg - HR 90-120 Abdomen: NT/ND, soft, bs+, no mass  Extremities: No edema bilateral lower extremities  CBC:  Recent Labs Lab 02/09/17 1327 02/10/17 0633 02/14/17 0445  WBC 18.0* 12.8* 11.9*  NEUTROABS 15.2*  --   --    HGB 14.0 12.6 12.1  HCT 44.0 40.2 38.5  MCV 92.4 92.6 91.7  PLT 301 257 096   Basic Metabolic Panel:  Recent Labs Lab 02/09/17 1327 02/10/17 0633 02/11/17 1001 02/12/17 0420 02/13/17 0430 02/14/17 0445  NA 142 139 139 142 140 141  K 3.6 3.0* 4.0 4.7 4.5 4.2  CL 94* 93* 101 106 104 103  CO2 35* 34* 29 29 27 29   GLUCOSE 185* 175* 202* 137* 171* 151*  BUN 19 20 15 16 14 14   CREATININE 1.26* 1.34* 1.22* 1.25* 1.28* 1.17*  CALCIUM 10.2 9.4 9.4 9.1 9.6 9.2  MG 1.9  --  2.0 2.0 2.2  --    GFR: Estimated Creatinine Clearance: 53.1 mL/min (A) (by C-G formula based on SCr of 1.17 mg/dL (H)).   Cardiac Enzymes:  Recent Labs Lab 02/09/17 1327 02/09/17 1916 02/10/17 0044 02/10/17 0633  TROPONINI 0.04* 0.03* 0.03* 0.03*    HbA1C: Hemoglobin A1C  Date/Time Value Ref Range Status  03/01/2015 03:19 PM 6.1  Final    Comment:    4.8-5.6% normal range   Hgb A1c MFr Bld  Date/Time Value Ref Range Status  02/10/2017 06:33 AM 7.1 (H) 4.8 - 5.6 % Final    Comment:    (NOTE)         Prediabetes: 5.7 - 6.4         Diabetes: >6.4         Glycemic control for adults with diabetes: <7.0     CBG:  Recent Labs Lab 02/13/17 1133 02/13/17 1642 02/13/17 2149 02/14/17 0743 02/14/17 1119  GLUCAP 226* 156* 162* 155* 147*    Recent Results (from the past 240 hour(s))  MRSA PCR Screening     Status: None   Collection Time: 02/09/17  6:38 PM  Result Value Ref Range Status   MRSA by PCR NEGATIVE NEGATIVE Final    Comment:        The GeneXpert MRSA Assay (FDA approved for NASAL specimens only), is one component of a comprehensive MRSA colonization surveillance program. It is not intended to diagnose MRSA infection nor to guide or monitor treatment for MRSA infections.   Culture, Urine     Status: Abnormal   Collection Time: 02/12/17  5:25 AM  Result Value Ref Range Status   Specimen Description URINE, RANDOM  Final   Special Requests NONE  Final   Culture <10,000  COLONIES/mL INSIGNIFICANT GROWTH (A)  Final   Report Status 02/14/2017 FINAL  Final  Culture, blood (routine x 2)     Status: None (Preliminary result)   Collection Time: 02/13/17 12:20 AM  Result Value Ref Range Status   Specimen Description BLOOD RIGHT HAND  Final  Special Requests   Final    BOTTLES DRAWN AEROBIC AND ANAEROBIC Blood Culture adequate volume   Culture NO GROWTH 1 DAY  Final   Report Status PENDING  Incomplete  Culture, blood (routine x 2)     Status: None (Preliminary result)   Collection Time: 02/13/17 12:30 AM  Result Value Ref Range Status   Specimen Description BLOOD LEFT ARM  Final   Special Requests   Final    BOTTLES DRAWN AEROBIC AND ANAEROBIC Blood Culture adequate volume   Culture NO GROWTH 1 DAY  Final   Report Status PENDING  Incomplete     Scheduled Meds: . apixaban  5 mg Oral BID  . azelastine  2 spray Each Nare BID  . cetirizine  10 mg Oral QPM  . diltiazem  180 mg Oral BID  . ferrous sulfate  325 mg Oral BID WC  . insulin aspart  0-9 Units Subcutaneous TID WC  . levothyroxine  100 mcg Oral QAC breakfast  . metoprolol succinate  25 mg Oral Daily  . mometasone   Topical Daily  . pantoprazole  40 mg Oral Daily  . predniSONE  10 mg Oral Q breakfast  . rosuvastatin  10 mg Oral QHS  . spironolactone  25 mg Oral Daily     LOS: 5 days   Cherene Altes, MD [facility] Office  [phone] Pager - Text Page per Amion as per below:  On-Call/Text Page:      [email or web address]      password [email or web address]  If 7PM-7AM, please contact night-coverage [email or web address] Password [email or web address] 02/14/2017, 2:28 PM

## 2017-02-14 NOTE — Plan of Care (Signed)
Problem: Activity: Goal: Risk for activity intolerance will decrease Outcome: Progressing Ambulated with PT 40 ft; tolerated.

## 2017-02-14 NOTE — Evaluation (Addendum)
Physical Therapy Evaluation Patient Details Name: Lorraine Leonard MRN: [id number] DOB: 27-Apr-1940 Today's Date: 02/14/2017   History of Present Illness  76 y.o. F Hx Anxiety, Depression, Acute diastolic CHF,  Atrial fibrillation, HTN, COPD, Chronic Hypoxic Respiratory Failure on 3 L O2, OSA, DM2 with peripheral neuropathy, HLD, and Hypothyroidism who was sent to the ED from [place] [facility] after being found to be in atrial fibrillation with rapid ventricular response.    Clinical Impression  Patient presents with problems listed below.  Will benefit from acute PT to maximize functional mobility prior to discharge home with husband.  Recommend HHPT at d/c for continued therapy.    Follow Up Recommendations Home health PT;Supervision for mobility/OOB    Equipment Recommendations  None recommended by PT    Recommendations for Other Services       Precautions / Restrictions Precautions Precautions: Fall Restrictions Weight Bearing Restrictions: No      Mobility  Bed Mobility               General bed mobility comments: Patient in recliner as PT entered room  Transfers Overall transfer level: Needs assistance Equipment used: None;Rolling walker (2 wheeled) Transfers: Sit to/from Stand Sit to Stand: Min guard         General transfer comment: No physical assist to stand from recliner.  Ambulation/Gait Ambulation/Gait assistance: Min assist;Min guard Ambulation Distance (Feet): 40 Feet Assistive device: None;Rolling walker (2 wheeled) Gait Pattern/deviations: Step-through pattern;Decreased step length - right;Decreased step length - left;Decreased stride length;Shuffle;Staggering left;Staggering right;Trunk flexed Gait velocity: decreased Gait velocity interpretation: Below normal speed for age/gender General Gait Details: Attempted gait with no AD.  Patient with very unsteady gait, reaching for furniture/wall in room for balance.  Improved gait/balance with RW.   Patient did have difficulty maneuvering RW in tight spaces.   Resting HR ranged 98-109 bpm.  Max HR was 128 during gait.  Ambulated on 3L O2.  Stairs            Wheelchair Mobility    Modified Rankin (Stroke Patients Only)       Balance Overall balance assessment: Needs assistance Sitting-balance support: No upper extremity supported;Feet supported Sitting balance-Leahy Scale: Good     Standing balance support: No upper extremity supported;Bilateral upper extremity supported Standing balance-Leahy Scale: Poor Standing balance comment: Balance improved with use of RW during gait.                             Pertinent Vitals/Pain Pain Assessment: No/denies pain Faces Pain Scale: No hurt    Home Living Family/patient expects to be discharged to:: Private residence Living Arrangements: Spouse/significant other Available Help at Discharge: Family;Available 24 hours/day Type of Home: Mobile home Home Access: Stairs to enter Entrance Stairs-Rails: Right;Left Entrance Stairs-Number of Steps: 5 Home Layout: One level Home Equipment: Walker - 2 wheels;Bedside commode;Cane - single point;Wheelchair - manual Additional Comments: 3L home O2    Prior Function Level of Independence: Needs assistance   Gait / Transfers Assistance Needed: RW/cane for community mobility  ADL's / Homemaking Assistance Needed: husband assisting with ADL recently  Comments: Granddaughter assists with housekeeping     Hand Dominance        Extremity/Trunk Assessment   Upper Extremity Assessment Upper Extremity Assessment: Defer to OT evaluation    Lower Extremity Assessment Lower Extremity Assessment: Generalized weakness    Cervical / Trunk Assessment Cervical / Trunk Assessment: Kyphotic  Communication  Communication: No difficulties  Cognition Arousal/Alertness: Awake/alert Behavior During Therapy: WFL for tasks assessed/performed;Anxious Overall Cognitive Status:  Within Functional Limits for tasks assessed                                        General Comments      Exercises     Assessment/Plan    PT Assessment Patient needs continued PT services  PT Problem List Decreased strength;Decreased activity tolerance;Decreased balance;Decreased mobility;Decreased knowledge of use of DME;Cardiopulmonary status limiting activity;Obesity       PT Treatment Interventions DME instruction;Gait training;Stair training;Functional mobility training;Therapeutic activities;Therapeutic exercise;Balance training;Patient/family education    PT Goals (Current goals can be found in the Care Plan section)  Acute Rehab PT Goals Patient Stated Goal: go home PT Goal Formulation: With patient Time For Goal Achievement: 02/21/17 Potential to Achieve Goals: Good    Frequency Min 3X/week   Barriers to discharge        Co-evaluation               AM-PAC PT "6 Clicks" Daily Activity  Outcome Measure Difficulty turning over in bed (including adjusting bedclothes, sheets and blankets)?: None Difficulty moving from lying on back to sitting on the side of the bed? : A Little Difficulty sitting down on and standing up from a chair with arms (e.g., wheelchair, bedside commode, etc,.)?: None Help needed moving to and from a bed to chair (including a wheelchair)?: A Little Help needed walking in hospital room?: A Little Help needed climbing 3-5 steps with a railing? : A Little 6 Click Score: 20    End of Session Equipment Utilized During Treatment: Oxygen;Gait belt Activity Tolerance: Patient tolerated treatment well;Patient limited by fatigue Patient left: in chair;with call bell/phone within reach;with chair alarm set Nurse Communication: Mobility status PT Visit Diagnosis: Unsteadiness on feet (R26.81);Other abnormalities of gait and mobility (R26.89);Muscle weakness (generalized) (M62.81)    Time: 7591-6384 PT Time Calculation (min)  (ACUTE ONLY): 17 min   Charges:   PT Evaluation $PT Eval Moderate Complexity: 1 Mod     PT G Codes:        Carita Pian. Sanjuana Kava, [facility] Acute Rehab Services Pager [place] 02/14/2017, 5:24 PM

## 2017-02-14 NOTE — Evaluation (Signed)
Occupational Therapy Evaluation Patient Details Name: Lorraine Leonard MRN: [id number] DOB: 1940/05/01 Today's Date: 02/14/2017    History of Present Illness 76 y.o. F Hx Anxiety, Depression, Acute diastolic CHF,  Atrial fibrillation, HTN, COPD, Chronic Hypoxic Respiratory Failure on 3 L O2, OSA, DM2 with peripheral neuropathy, HLD, and Hypothyroidism who was sent to the ED from [place] [facility] after being found to be in atrial fibrillation with rapid ventricular response.   Clinical Impression   Pt reports she required assist for ADL PTA. Currently pt requires min assist for functional mobility and seated ADL, mod assist for LB ADL. HR in 120s with mobility; DOE 3/4; unable to obtain reliable SpO2 reading. Pt planning to d/c home with supervision from her husband. Recommending HHOT for follow up to maximize independence and safety with ADL and functional mobility upon return home. Pt would benefit from continued skilled OT to address established goals.    Follow Up Recommendations  Home health OT;Supervision/Assistance - 24 hour    Equipment Recommendations  3 in 1 bedside commode;Tub/shower seat    Recommendations for Other Services PT consult     Precautions / Restrictions Precautions Precautions: Fall Restrictions Weight Bearing Restrictions: No      Mobility Bed Mobility               General bed mobility comments: Pt sitting EOB upon arrival  Transfers Overall transfer level: Needs assistance Equipment used: Rolling walker (2 wheeled) Transfers: Sit to/from Stand Sit to Stand: Min assist         General transfer comment: steadying assist for balance. Cues for hand placement    Balance Overall balance assessment: Needs assistance Sitting-balance support: Feet supported;Single extremity supported Sitting balance-Leahy Scale: Good     Standing balance support: Bilateral upper extremity supported Standing balance-Leahy Scale: Poor Standing balance comment:  RW for support                           ADL either performed or assessed with clinical judgement   ADL Overall ADL's : Needs assistance/impaired Eating/Feeding: Set up;Sitting   Grooming: Set up;Supervision/safety;Sitting   Upper Body Bathing: Minimal assistance;Sitting   Lower Body Bathing: Moderate assistance;Sit to/from stand   Upper Body Dressing : Minimal assistance;Sitting   Lower Body Dressing: Moderate assistance;Sit to/from stand   Toilet Transfer: Minimal assistance;Ambulation;Regular Toilet;Grab bars;RW   Toileting- Water quality scientist and Hygiene: Min guard;Sitting/lateral lean Toileting - Clothing Manipulation Details (indicate cue type and reason): for peri care     Functional mobility during ADLs: Minimal assistance;Rolling walker General ADL Comments: HR in 120s with mobililty. DOE 3/4 with functional mobility; unable to obtain good SpO2 reading on 3L supplemental O2     Vision         Perception     Praxis      Pertinent Vitals/Pain Pain Assessment: Faces Faces Pain Scale: No hurt     Hand Dominance     Extremity/Trunk Assessment Upper Extremity Assessment Upper Extremity Assessment: Generalized weakness   Lower Extremity Assessment Lower Extremity Assessment: Defer to PT evaluation   Cervical / Trunk Assessment Cervical / Trunk Assessment: Kyphotic   Communication Communication Communication: No difficulties   Cognition Arousal/Alertness: Awake/alert Behavior During Therapy: WFL for tasks assessed/performed Overall Cognitive Status: Within Functional Limits for tasks assessed  General Comments       Exercises     Shoulder Instructions      Home Living Family/patient expects to be discharged to:: Private residence Living Arrangements: Spouse/significant other Available Help at Discharge: Family Type of Home: Mobile home Home Access: Stairs to enter Entrance  Stairs-Number of Steps: 5   Home Layout: One level     Bathroom Shower/Tub: Teacher, early years/pre: Standard     Home Equipment: Environmental consultant - 2 wheels   Additional Comments: 3L home O2      Prior Functioning/Environment Level of Independence: Needs assistance  Gait / Transfers Assistance Needed: RW/cane for community mobility ADL's / Homemaking Assistance Needed: husband assisting with ADL recently            OT Problem List: Decreased strength;Decreased activity tolerance;Impaired balance (sitting and/or standing);Decreased knowledge of use of DME or AE;Decreased safety awareness;Cardiopulmonary status limiting activity;Obesity      OT Treatment/Interventions: Self-care/ADL training;Therapeutic exercise;Energy conservation;DME and/or AE instruction;Therapeutic activities;Patient/family education;Balance training    OT Goals(Current goals can be found in the care plan section) Acute Rehab OT Goals Patient Stated Goal: go home OT Goal Formulation: With patient/family Time For Goal Achievement: 02/28/17 Potential to Achieve Goals: Good ADL Goals Pt Will Perform Upper Body Bathing: with set-up;sitting Pt Will Perform Lower Body Bathing: with supervision;sit to/from stand Pt Will Transfer to Toilet: with supervision;ambulating;bedside commode Pt Will Perform Toileting - Clothing Manipulation and hygiene: with supervision;sit to/from stand Pt Will Perform Tub/Shower Transfer: Tub transfer;with supervision;ambulating;shower seat;rolling walker Additional ADL Goal #1: Pt will independently verbally recall 3 energy conservation strategies and use during ADL.  OT Frequency: Min 2X/week   Barriers to D/C:            Co-evaluation              AM-PAC PT "6 Clicks" Daily Activity     Outcome Measure Help from another person eating meals?: None Help from another person taking care of personal grooming?: A Little Help from another person toileting, which includes  using toliet, bedpan, or urinal?: A Little Help from another person bathing (including washing, rinsing, drying)?: A Lot Help from another person to put on and taking off regular upper body clothing?: A Little Help from another person to put on and taking off regular lower body clothing?: A Lot 6 Click Score: 17   End of Session Equipment Utilized During Treatment: Rolling walker;Oxygen  Activity Tolerance: Patient tolerated treatment well Patient left: in chair;with call bell/phone within reach;with family/visitor present  OT Visit Diagnosis: Unsteadiness on feet (R26.81);Other abnormalities of gait and mobility (R26.89)                Time: 2423-5361 OT Time Calculation (min): 18 min Charges:  OT General Charges $OT Visit: 1 Visit OT Evaluation $OT Eval Moderate Complexity: 1 Mod G-Codes:     Ilithyia Titzer A. Ulice Brilliant, M.S., OTR/L Pager: [place] 02/14/2017, 3:39 PM

## 2017-02-15 LAB — GLUCOSE, CAPILLARY
Glucose-Capillary: 146 mg/dL — ABNORMAL HIGH (ref 65–99)
Glucose-Capillary: 195 mg/dL — ABNORMAL HIGH (ref 65–99)
Glucose-Capillary: 168 mg/dL — ABNORMAL HIGH (ref 65–99)
Glucose-Capillary: 161 mg/dL — ABNORMAL HIGH (ref 65–99)

## 2017-02-15 NOTE — Progress Notes (Signed)
Physical Therapy Treatment Patient Details Name: Lorraine Leonard MRN: [id number] DOB: 1940-04-27 Today's Date: 02/15/2017    History of Present Illness 76 y.o. F Hx Anxiety, Depression, Acute diastolic CHF,  Atrial fibrillation, HTN, COPD, Chronic Hypoxic Respiratory Failure on 3 L O2, OSA, DM2 with peripheral neuropathy, HLD, and Hypothyroidism who was sent to the ED from [place] [facility] after being found to be in atrial fibrillation with rapid ventricular response.    PT Comments    Patient making improvements with mobility and gait.  Improved balance with use of RW.  Patient able to ambulate 100' with RW with max HR of 130 (briefly).   Follow Up Recommendations  Home health PT;Supervision for mobility/OOB     Equipment Recommendations  None recommended by PT    Recommendations for Other Services       Precautions / Restrictions Precautions Precautions: Fall Restrictions Weight Bearing Restrictions: No    Mobility  Bed Mobility Overal bed mobility: Needs Assistance Bed Mobility: Supine to Sit;Sit to Supine     Supine to sit: Min guard Sit to supine: Min assist   General bed mobility comments: Assist to bring LE's onto bed to return to supine.  Transfers Overall transfer level: Needs assistance Equipment used: Rolling walker (2 wheeled) Transfers: Sit to/from Stand Sit to Stand: Min guard         General transfer comment: From bed and toilet.  Assist for safety only.  Ambulation/Gait Ambulation/Gait assistance: Min guard Ambulation Distance (Feet): 100 Feet (100' x2 with seated rest break) Assistive device: Rolling walker (2 wheeled) Gait Pattern/deviations: Step-through pattern;Decreased stride length;Trunk flexed Gait velocity: decreased Gait velocity interpretation: Below normal speed for age/gender General Gait Details: Patient with improved balance with RW.  Able to ambulate 100' x2 with seated rest break.  Max HR during ambulation was 130.  Returned to  99 bpm with rest.   Stairs            Wheelchair Mobility    Modified Rankin (Stroke Patients Only)       Balance           Standing balance support: Bilateral upper extremity supported Standing balance-Leahy Scale: Poor                              Cognition Arousal/Alertness: Awake/alert Behavior During Therapy: WFL for tasks assessed/performed;Anxious Overall Cognitive Status: Within Functional Limits for tasks assessed                                        Exercises      General Comments        Pertinent Vitals/Pain Pain Assessment: No/denies pain    Home Living                      Prior Function            PT Goals (current goals can now be found in the care plan section) Acute Rehab PT Goals Patient Stated Goal: go home Progress towards PT goals: Progressing toward goals    Frequency    Min 3X/week      PT Plan Current plan remains appropriate    Co-evaluation              AM-PAC PT "6 Clicks" Daily Activity  Outcome Measure  Difficulty turning  over in bed (including adjusting bedclothes, sheets and blankets)?: None Difficulty moving from lying on back to sitting on the side of the bed? : A Little Difficulty sitting down on and standing up from a chair with arms (e.g., wheelchair, bedside commode, etc,.)?: None Help needed moving to and from a bed to chair (including a wheelchair)?: A Little Help needed walking in hospital room?: A Little Help needed climbing 3-5 steps with a railing? : A Little 6 Click Score: 20    End of Session Equipment Utilized During Treatment: Oxygen;Gait belt Activity Tolerance: Patient tolerated treatment well Patient left: in bed;with call bell/phone within reach;with bed alarm set Nurse Communication: Mobility status PT Visit Diagnosis: Unsteadiness on feet (R26.81);Other abnormalities of gait and mobility (R26.89);Muscle weakness (generalized) (M62.81)      Time: 5056-9794 PT Time Calculation (min) (ACUTE ONLY): 28 min  Charges:  $Gait Training: 23-37 mins                    G Codes:       Carita Pian. Sanjuana Kava, [facility] Acute Rehab Services Pager [place] 02/15/2017, 4:53 PM

## 2017-02-15 NOTE — Plan of Care (Signed)
Problem: Education: Goal: Knowledge of [facility] Education information/materials will improve Outcome: Progressing Patient educated on treatment plan. Verbalizes understanding of prescribed therapeutic regimen and verbalizes understanding of importance of compliance with prescribed regimen.

## 2017-02-15 NOTE — Progress Notes (Signed)
[facility] TEAM 1 - Stepdown/ICU TEAM  MORGAN KEINATH  NAT:[id number] DOB: 20-Mar-1941 DOA: 02/09/2017 PCP: Sharion Balloon, FNP    Brief Narrative:  76 y.o.F Hx Anxiety, Depression, Acute diastolic CHF,  Atrial fibrillation, HTN, COPD, Chronic Hypoxic Respiratory Failure on 3 L O2, OSA, DM2 with peripheral neuropathy, HLD, and Hypothyroidism who was sent to the ED from [place] [facility] after being found to be in atrial fibrillation with rapid ventricular response. 4 days prior to admission patient was at the [place] clinic to get weighed and get an EKG. She says her weight was up 12 lbs from the past two weeks and her heart rate was in the 40s. She was told to stop her Toprol but continue her diltiazem.   Subjective: The patient is very reluctant to go home.  She continues to insist that she feels unstable on her feet.  She denies chest pain or shortness of breath.  I have agreed to allow her 1 more night to convalesce but have explained to her that we are rapidly approaching the point of diminishing returns and that I fear if she does not return home soon she will begin to lose independence.  I have encouraged her that we will arrange for home therapy as suggested by PT/OT.   Assessment & Plan:  Atrial fibrillation w/ RVR - Sinus brady Cardiology has cleared the pt for d/c home - HR remaining 90-120 th/o most of today - Chadsvasc is 4 - on eliquis - stable at this time - explained to pt and her husband that further titration of her meds raises the risk of signif bradycardia   Chest pain - resolved  Denies chest pain at this time - no known history of coronary artery disease  Chronic UTI Chronic Bactrim discontinued due to interference w/ use of antiarrythmic meds - Tikosyn not yet initiated due to concern that pt had not been 100% consistent w/ her anticoag at home - to be re-visited in f/u w/ Cards, therefore attempt to remain off abx for now   Leukocytosis Afebrile - CXR  unremarkable - urine and blood cx unrevealing - WBC has been improving - off abx at this time   Hypothyroidism  TSH elevated at 4.65 but would not adjust dose in setting of afib w/ RVR - continue synthroid 100 g daily  HLD Cont usual home tx   DM2  10/23 A1c 7.1 - CBG well controlled at this time   Pulmonary Fibrosis/Chronic Hypoxic Respiratory Failure > home O2 3 L follows with Dr. 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 pylori  CKD Stage 2 baseline Cr ~0.9 - crt approaching baseline   Recent Labs Lab 02/10/17 0633 02/11/17 1001 02/12/17 0420 02/13/17 0430 02/14/17 0445  CREATININE 1.34* 1.22* 1.25* 1.28* 1.17*    OSA/OHS Reportedly willing to try CPAP while in the hospital but then refused when RT tried to place each night  Obesity - Body mass index is 36.99 kg/m.  DVT prophylaxis: full anticoag Code Status: FULL CODE Family Communication: spoke w/ husband at bedside at length  Disposition Plan: d/c home 10/29 unless acute issues develop overnight   Consultants:  [name]  Antimicrobials:  none   Objective: Blood pressure 120/75, pulse 100, temperature 98 F (36.7 C), temperature source Axillary, resp. rate 19, height 5\' 8"  (1.727 m), weight 110.4 kg (243 lb 4.8 oz), SpO2 93 %.  Intake/Output Summary (Last 24 hours) at 02/15/17 1626 Last data filed at 02/15/17 1215  Gross per 24 hour  Intake              480 ml  Output              400 ml  Net               80 ml   Filed Weights   02/13/17 0404 02/14/17 0631 02/15/17 0615  Weight: 109.6 kg (241 lb 11.2 oz) 110 kg (242  lb 6.4 oz) 110.4 kg (243 lb 4.8 oz)    Examination: General: No acute respiratory distress - sleepy  Lungs: Clear to auscultation bilaterally  Cardiovascular: irreg irreg - HR remains 90-120 Abdomen: NT/ND, soft, bs+, no mass  Extremities: No edema B LE   CBC:  Recent Labs Lab 02/09/17 1327 02/10/17 0633 02/14/17 0445  WBC 18.0* 12.8* 11.9*  NEUTROABS 15.2*  --   --   HGB 14.0 12.6 12.1  HCT 44.0 40.2 38.5  MCV 92.4 92.6 91.7  PLT 301 257 161   Basic Metabolic Panel:  Recent Labs Lab 02/09/17 1327 02/10/17 0633 02/11/17 1001 02/12/17 0420 02/13/17 0430 02/14/17 0445  NA 142 139 139 142 140 141  K 3.6 3.0* 4.0 4.7 4.5 4.2  CL 94* 93* 101 106 104 103  CO2 35* 34* 29 29 27 29   GLUCOSE 185* 175* 202* 137* 171* 151*  BUN 19 20 15 16 14 14   CREATININE 1.26* 1.34* 1.22* 1.25* 1.28* 1.17*  CALCIUM 10.2 9.4 9.4 9.1 9.6 9.2  MG 1.9  --  2.0 2.0 2.2  --    GFR: Estimated Creatinine Clearance: 53.3 mL/min (A) (by C-G formula based on SCr of 1.17 mg/dL (H)).   Cardiac Enzymes:  Recent Labs Lab 02/09/17 1327 02/09/17 1916 02/10/17 0044 02/10/17 0633  TROPONINI 0.04* 0.03* 0.03* 0.03*    HbA1C: Hemoglobin A1C  Date/Time Value Ref Range Status  03/01/2015 03:19 PM 6.1  Final    Comment:    4.8-5.6% normal range   Hgb A1c MFr Bld  Date/Time Value Ref Range Status  02/10/2017 06:33 AM 7.1 (H) 4.8 - 5.6 % Final    Comment:    (NOTE)         Prediabetes: 5.7 - 6.4         Diabetes: >6.4         Glycemic control for adults with diabetes: <7.0     CBG:  Recent Labs Lab 02/14/17 1119 02/14/17 1632 02/14/17 2044 02/15/17 0755 02/15/17 1200  GLUCAP 147* 180* 194* 146* 195*    Recent Results (from the past 240 hour(s))  MRSA PCR Screening     Status: None   Collection Time: 02/09/17  6:38 PM  Result Value Ref Range Status   MRSA by PCR NEGATIVE NEGATIVE Final    Comment:        The GeneXpert MRSA Assay (FDA approved for NASAL specimens only), is  one component of a comprehensive MRSA colonization surveillance program. It is not intended to diagnose MRSA infection nor to guide or monitor treatment for MRSA infections.   Culture, Urine     Status: Abnormal   Collection Time: 02/12/17  5:25 AM  Result Value Ref  Range Status   Specimen Description URINE, RANDOM  Final   Special Requests NONE  Final   Culture <10,000 COLONIES/mL INSIGNIFICANT GROWTH (A)  Final   Report Status 02/14/2017 FINAL  Final  Culture, blood (routine x 2)     Status: None (Preliminary result)   Collection Time: 02/13/17 12:20 AM  Result Value Ref Range Status   Specimen Description BLOOD RIGHT HAND  Final   Special Requests   Final    BOTTLES DRAWN AEROBIC AND ANAEROBIC Blood Culture adequate volume   Culture NO GROWTH 1 DAY  Final   Report Status PENDING  Incomplete  Culture, blood (routine x 2)     Status: None (Preliminary result)   Collection Time: 02/13/17 12:30 AM  Result Value Ref Range Status   Specimen Description BLOOD LEFT ARM  Final   Special Requests   Final    BOTTLES DRAWN AEROBIC AND ANAEROBIC Blood Culture adequate volume   Culture NO GROWTH 1 DAY  Final   Report Status PENDING  Incomplete     Scheduled Meds: . apixaban  5 mg Oral BID  . azelastine  2 spray Each Nare BID  . cetirizine  10 mg Oral QPM  . diltiazem  180 mg Oral BID  . ferrous sulfate  325 mg Oral BID WC  . insulin aspart  0-9 Units Subcutaneous TID WC  . levothyroxine  100 mcg Oral QAC breakfast  . metoprolol succinate  50 mg Oral Daily  . mometasone   Topical Daily  . pantoprazole  40 mg Oral Daily  . predniSONE  10 mg Oral Q breakfast  . rosuvastatin  10 mg Oral QHS  . spironolactone  25 mg Oral Daily     LOS: 6 days   Cherene Altes, MD [facility] Office  [phone] Pager - Text Page per Amion as per below:  On-Call/Text Page:      [email or web address]      password [email or web address]  If 7PM-7AM, please contact night-coverage [email or web address] Password  [email or web address] 02/15/2017, 4:26 PM

## 2017-02-16 ENCOUNTER — Other Ambulatory Visit: Payer: Self-pay | Admitting: Family

## 2017-02-16 DIAGNOSIS — I208 Other forms of angina pectoris: Secondary | ICD-10-CM

## 2017-02-16 LAB — BASIC METABOLIC PANEL
Anion gap: 10 (ref 5–15)
BUN: 19 mg/dL (ref 6–20)
CO2: 24 mmol/L (ref 22–32)
Calcium: 9.1 mg/dL (ref 8.9–10.3)
Chloride: 105 mmol/L (ref 101–111)
Creatinine, Ser: 1.17 mg/dL — ABNORMAL HIGH (ref 0.44–1.00)
GFR calc Af Amer: 51 mL/min — ABNORMAL LOW (ref >60)
GFR calc non Af Amer: 44 mL/min — ABNORMAL LOW (ref >60)
Glucose, Bld: 175 mg/dL — ABNORMAL HIGH (ref 65–99)
Potassium: 3.9 mmol/L (ref 3.5–5.1)
Sodium: 139 mmol/L (ref 135–145)

## 2017-02-16 LAB — CBC
HCT: 38 % (ref 36.0–46.0)
Hemoglobin: 12.1 g/dL (ref 12.0–15.0)
MCH: 29 pg (ref 26.0–34.0)
MCHC: 31.8 g/dL (ref 30.0–36.0)
MCV: 91.1 fL (ref 78.0–100.0)
Platelets: 278 10*3/uL (ref 150–400)
RBC: 4.17 MIL/uL (ref 3.87–5.11)
RDW: 15.2 % (ref 11.5–15.5)
WBC: 10.4 10*3/uL (ref 4.0–10.5)

## 2017-02-16 LAB — GLUCOSE, CAPILLARY
Glucose-Capillary: 129 mg/dL — ABNORMAL HIGH (ref 65–99)
Glucose-Capillary: 206 mg/dL — ABNORMAL HIGH (ref 65–99)

## 2017-02-16 MED ORDER — METOPROLOL SUCCINATE ER 50 MG PO TB24: 50.0000 mg | ORAL_TABLET | Freq: Every day | ORAL | 0 refills | Status: DC

## 2017-02-16 MED ORDER — SPIRONOLACTONE 25 MG PO TABS: 25.0000 mg | ORAL_TABLET | Freq: Every day | ORAL | 0 refills | Status: DC

## 2017-02-16 MED ORDER — LEVALBUTEROL HCL 1.25 MG/0.5ML IN NEBU: 1.2500 mg | INHALATION_SOLUTION | RESPIRATORY_TRACT | 0 refills | Status: DC | PRN

## 2017-02-16 MED ORDER — DILTIAZEM HCL ER COATED BEADS 180 MG PO CP24: 180.0000 mg | ORAL_CAPSULE | Freq: Two times a day (BID) | ORAL | 0 refills | Status: DC

## 2017-02-16 NOTE — Care Management Note (Signed)
Case Management Note  Patient Details  Name: SHEQUITA PEPLINSKI MRN: [id number] Date of Birth: 05-18-1940  Subjective/Objective:     afib with RVR               Action/Plan: Discharge Planning: NCM spoke to pt and husband at dc. Pt has oxygen ([facility]), RW and bedside commode at home. Pt will need neb machine for home. Contacted [facility] with new referral for HH, neb machine and portable to go home.   PCP Evelina Dun A MD  Expected Discharge Date:  02/16/17               Expected Discharge Plan:  [place]  In-House Referral:  NA  Discharge planning Services  CM Consult  Post Acute Care Choice:  Home Health Choice offered to:  Patient  DME Arranged:  Nebulizer machine DME Agency:  [place] Arranged:  PT, OT [facility] Agency:  [place]  Status of Service:  Completed, signed off  If discussed at [place] of Stay Meetings, dates discussed:    Additional Comments:  Erenest Rasher, RN 02/16/2017, 3:25 PM

## 2017-02-16 NOTE — Progress Notes (Signed)
Occupational Therapy Treatment Patient Details Name: Lorraine Leonard MRN: [id number] DOB: 18-Feb-1941 Today's Date: 02/16/2017    History of present illness 76 y.o. F Hx Anxiety, Depression, Acute diastolic CHF,  Atrial fibrillation, HTN, COPD, Chronic Hypoxic Respiratory Failure on 3 L O2, OSA, DM2 with peripheral neuropathy, HLD, and Hypothyroidism who was sent to the ED from [place] [facility] after being found to be in atrial fibrillation with rapid ventricular response.   OT comments  Pt sleepy, stated she slept poorly. Pt educated in energy conservation strategies and performed standing grooming and toileting this visit. Pt eager to go home later today.  Follow Up Recommendations  Home health OT;Supervision/Assistance - 24 hour    Equipment Recommendations  3 in 1 bedside commode;Tub/shower seat    Recommendations for Other Services      Precautions / Restrictions Precautions Precautions: Fall       Mobility Bed Mobility Overal bed mobility: Modified Independent             General bed mobility comments: HOB up 30 degrees, used rail to push up, no physical assist  Transfers Overall transfer level: Needs assistance Equipment used: Rolling walker (2 wheeled) Transfers: Sit to/from Stand Sit to Stand: Supervision         General transfer comment: From bed and toilet.  Assist for safety only.    Balance Overall balance assessment: Needs assistance   Sitting balance-Leahy Scale: Good     Standing balance support: Bilateral upper extremity supported Standing balance-Leahy Scale: Poor                             ADL either performed or assessed with clinical judgement   ADL Overall ADL's : Needs assistance/impaired     Grooming: Oral care;Standing;Supervision/safety         Lower Body Bathing Details (indicate cue type and reason): educated in benefits of long handled bath sponge Upper Body Dressing : Set up;Sitting       Toilet Transfer:  Supervision/safety;Ambulation;RW;Grab bars;Regular Museum/gallery exhibitions officer and Hygiene: Supervision/safety;Sit to/from stand       Functional mobility during ADLs: Supervision/safety;Rolling walker General ADL Comments: Pt educated in energy conservation strategies and reinforced with handout.     Vision       Perception     Praxis      Cognition Arousal/Alertness: Lethargic;Suspect due to medications (pt sleepy ) Behavior During Therapy: WFL for tasks assessed/performed Overall Cognitive Status: Within Functional Limits for tasks assessed                                          Exercises     Shoulder Instructions       General Comments      Pertinent Vitals/ Pain       Pain Assessment: No/denies pain  Home Living                                          Prior Functioning/Environment              Frequency  Min 2X/week        Progress Toward Goals  OT Goals(current goals can now be found in the care plan section)  Progress towards  OT goals: Progressing toward goals  Acute Rehab OT Goals Patient Stated Goal: go home OT Goal Formulation: With patient/family Time For Goal Achievement: 02/28/17 Potential to Achieve Goals: Good  Plan Discharge plan remains appropriate    Co-evaluation                 AM-PAC PT "6 Clicks" Daily Activity     Outcome Measure   Help from another person eating meals?: None Help from another person taking care of personal grooming?: A Little Help from another person toileting, which includes using toliet, bedpan, or urinal?: A Little Help from another person bathing (including washing, rinsing, drying)?: A Little Help from another person to put on and taking off regular upper body clothing?: None Help from another person to put on and taking off regular lower body clothing?: A Little 6 Click Score: 20    End of Session Equipment Utilized During Treatment:  Rolling walker;Oxygen  OT Visit Diagnosis: Unsteadiness on feet (R26.81);Other abnormalities of gait and mobility (R26.89)   Activity Tolerance Patient limited by fatigue   Patient Left in bed;with call bell/phone within reach   Nurse Communication          Time: 0850-0909 OT Time Calculation (min): 19 min  Charges: OT General Charges $OT Visit: 1 Visit OT Treatments $Self Care/Home Management : 8-22 mins  02/16/2017 Nestor Lewandowsky, OTR/L Pager: [phone]   Werner Lean, [place] 02/16/2017, 9:16 AM

## 2017-02-16 NOTE — Telephone Encounter (Signed)
[facility] Vit D 08/18/14  25

## 2017-02-16 NOTE — Discharge Summary (Signed)
DISCHARGE SUMMARY  Lorraine Leonard  MR#: [id number]  DOB:1941/01/25  Date of Admission: 02/09/2017 Date of Discharge: 02/16/2017  Attending Physician:Jann Ra T  Patient's PZW:[name], [name], FNP  Consults:  [facility] Cards   Disposition: D/C home   Follow-up Appts: Follow-up Information    Arnoldo Lenis, MD Follow up on 02/18/2017.   Specialty:  Cardiology Why:  1:40PM Contact information: [place] [address] [phone]           Tests Needing Follow-up: -assess HR control -routine f/u of pt on anticoag  -assess renal fxn and lytes on diuretic and K replacement   Discharge Diagnoses: Atrial fibrillation w/ RVR - Sinus brady Chest pain - resolved  Chronic UTI Hypothyroidism HLD DM2  Pulmonary Fibrosis/Chronic Hypoxic Respiratory Failure > home O2 3 L Gastric hyperplastic polyp CKD Stage 2 OSA/OHS Obesity - Body mass index is 36.99 kg/m.  Initial presentation: 76 y.o.F HxAnxiety, Depression, Acute diastolic CHF, Atrial fibrillation, HTN, COPD, Chronic Hypoxic Respiratory Failure on 3 L O2, OSA, DM2 with peripheral neuropathy, HLD, and Hypothyroidism who was sent to the ED from the [facility] clinic after being found to be in atrial fibrillation with rapid ventricular response. 4 days prior to admission patient was at the [facility] clinic to get weighed and get an EKG. She says her weight was up 12 lbs from the past two weeks and her heart rate was in the 40s. She was told to stop her Toprol but continue her diltiazem.   Hospital Course:  Atrial fibrillation w/ RVR - Sinus brady Cardiology has cleared the pt for d/c home - HR remaining 90-120 - Chadsvasc is 4 - on eliquis - stable at this time - explained to pt and her husband that further titration of her meds raises the risk of signif bradycardia - has been evaluated by PT/OT   Chest pain - resolved  Denies chest pain at this time - no known history of coronary artery  disease  Chronic UTI Chronic Bactrim discontinued due to interference w/ potential use of antiarrythmic meds - Tikosyn not yet initiated due to concern that pt had not been 100% consistent w/ her anticoag at home - to be re-visited in f/u w/ Cards, therefore attempt to remain off abx for now   Leukocytosis Afebrile - CXR unremarkable - urine and blood cx unrevealing - WBC normalized prior to d/c - off abx   Hypothyroidism TSH elevated at 4.65 but would not adjust dose in setting of afib w/ RVR - continue synthroid 100 g daily  HLD Cont usual home tx   DM2  10/23 A1c 7.1 - CBG well controlled at this time   Pulmonary Fibrosis/Chronic Hypoxic Respiratory Failure > home O2 3 L follows with Dr. Lamonte Sakai- continue chronic prednisone10 mg daily (home dose) - appears compensated at this time   Gastric hyperplastic polyp EGD 08/17/2015 by [facility]:  antral nodule biopsy showed benign gastric mucosa with hyperplastic epithelial changes,negative intestinal metaplasia significant inflammation or H pylori identified - repeat EGD 08/06/2016 [id number] gastric polyp with chronic inactive gastritis, negative H. pylori  CKD Stage 2 baseline Cr ~0.9 - crt approaching baseline at 1.17 on day of d/c   OSA/OHS Reportedly willing to try CPAP while in the hospital but then refused when RT tried to place each night consistently   Obesity - Body mass index is 36.99 kg/m.  Allergies as of 02/16/2017      Reactions   Allopurinol Swelling   Feet and legs  swell   Mobic [meloxicam] Swelling, Other (See Comments)   Feet and Leg swelling      Medication List    STOP taking these medications   albuterol 108 (90 Base) MCG/ACT inhaler Commonly known as:  PROVENTIL HFA;VENTOLIN HFA   trimethoprim 100 MG tablet Commonly known as:  TRIMPEX     TAKE these medications   azelastine 0.1 % nasal spray Commonly known as:  ASTELIN PLACE 2 SPRAYS INTO BOTH NOSTRILS 2 (TWO) TIMES DAILY AS  NEEDED.   calcium citrate 950 MG tablet Commonly known as:  CALCITRATE - dosed in mg elemental calcium Take 1 tablet by mouth 2 (two) times daily.   Cholecalciferol 2000 units Caps Take 2,000 Units by mouth daily.   conjugated estrogens vaginal cream Commonly known as:  PREMARIN Place 0.5 g vaginally every Thursday. Use as directed   cyanocobalamin 500 MCG tablet Take 500 mcg by mouth daily.   diazepam 5 MG tablet Commonly known as:  VALIUM TAKE 1 TABLET TWICE A DAY AS NEEDED FOR ANXIETY What changed:  how much to take  how to take this  when to take this  reasons to take this  additional instructions   diltiazem 180 MG 24 hr capsule Commonly known as:  CARDIZEM CD Take 1 capsule (180 mg total) by mouth 2 (two) times daily. What changed:  See the new instructions.   docusate sodium 100 MG capsule Commonly known as:  COLACE Take 200 mg by mouth daily as needed for mild constipation.   ELIQUIS 5 MG Tabs tablet Generic drug:  apixaban TAKE 1 TABLET BY MOUTH TWICE A DAY   EPINEPHrine 0.3 mg/0.3 mL Soaj injection Commonly known as:  EPI-PEN Inject 0.3 mg into the muscle once as needed (For anaphylaxis.).   esomeprazole 40 MG capsule Commonly known as:  NEXIUM Take 40 mg by mouth daily.   ferrous sulfate 325 (65 FE) MG tablet Take 325 mg by mouth 2 (two) times daily with a meal.   fluticasone 50 MCG/ACT nasal spray Commonly known as:  FLONASE PLACE 1 SPRAY INTO BOTH NOSTRILS 2 (TWO) TIMES DAILY AS NEEDED FOR ALLERGIES OR RHINITIS.   furosemide 40 MG tablet Commonly known as:  LASIX Take 40 mg ( 1 Tablet) Alternating with 60 mg ( 1 1/2 Tablet) Daily   INCRUSE ELLIPTA 62.5 MCG/INH Aepb Generic drug:  umeclidinium bromide TAKE 1 PUFF BY MOUTH EVERY DAY   levalbuterol 1.25 MG/0.5ML nebulizer solution Commonly known as:  XOPENEX Take 1.25 mg by nebulization every 4 (four) hours as needed for wheezing or shortness of breath.   levocetirizine 5 MG  tablet Commonly known as:  XYZAL Take 5 mg by mouth every evening.   levothyroxine 100 MCG tablet Commonly known as:  SYNTHROID, LEVOTHROID TAKE 1 TABLET (100 MCG TOTAL) BY MOUTH DAILY BEFORE BREAKFAST.   metoprolol succinate 50 MG 24 hr tablet Commonly known as:  TOPROL-XL Take 1 tablet (50 mg total) by mouth daily. Take with or immediately following a meal. What changed:  medication strength  how much to take  additional instructions   mometasone 0.1 % cream Commonly known as:  ELOCON APPLY TO AFFECTED AREA DAILY   ondansetron 8 MG tablet Commonly known as:  ZOFRAN Take 8 mg by mouth every 8 (eight) hours as needed for nausea or vomiting.   OVER THE COUNTER MEDICATION Place 1 drop into both eyes as needed (For dry eyes.). Systane Gel Drops   PATADAY 0.2 % Soln Generic drug:  Olopatadine  HCl Place 1 drop into both eyes daily.   polyethylene glycol powder powder Commonly known as:  GLYCOLAX/MIRALAX TAKE 17 G BY MOUTH 2 (TWO) TIMES DAILY AS NEEDED.   predniSONE 10 MG tablet Commonly known as:  DELTASONE Take 10 mg by mouth daily with breakfast.   PRESERVISION AREDS 2 PO Take 1 tablet by mouth 2 (two) times daily.   rosuvastatin 10 MG tablet Commonly known as:  CRESTOR TAKE 1 TABLET BY MOUTH AT BEDTIME   spironolactone 25 MG tablet Commonly known as:  ALDACTONE Take 1 tablet (25 mg total) by mouth daily.   traMADol 50 MG tablet Commonly known as:  ULTRAM TAKE 1 TABLET BY MOUTH EVERY 6 HOURS AS NEEDED What changed:  See the new instructions.   VIGAMOX 0.5 % ophthalmic solution Generic drug:  moxifloxacin Apply 1 drop to eye as needed (after eye injection).   vitamin C 500 MG tablet Commonly known as:  ASCORBIC ACID Take 500 mg by mouth daily.   Vitamin D (Ergocalciferol) 50000 units Caps capsule Commonly known as:  DRISDOL TAKE 1 CAPSULE (50,000 UNITS TOTAL) BY MOUTH EVERY 7 (SEVEN) DAYS. What changed:  additional instructions   vitamin E 400 UNIT  capsule Take 400 Units by mouth daily.            Durable Medical Equipment        Start     Ordered   02/16/17 1218  For home use only DME Shower stool  Once     02/16/17 1217   02/16/17 1217  For home use only DME 3 n 1  Once     02/16/17 1217      Day of Discharge BP 116/79 (BP Location: Left Arm)   Pulse 90   Temp 97.8 F (36.6 C) (Oral)   Resp (!) 22   Ht 5\' 8"  (1.727 m)   Wt 110.2 kg (242 lb 14.4 oz)   SpO2 99%   BMI 36.93 kg/m   Physical Exam: General: No acute respiratory distress Lungs: Clear to auscultation bilaterally without wheezes or crackles Cardiovascular: Irreg irreg - HR 95 - no M or rub  Abdomen: Nontender, obese, soft, bowel sounds positive, no rebound, no ascites, no appreciable mass Extremities: trace edema bilateral lower extremities  Basic Metabolic Panel:  Recent Labs Lab 02/11/17 1001 02/12/17 0420 02/13/17 0430 02/14/17 0445 02/16/17 0527  NA 139 142 140 141 139  K 4.0 4.7 4.5 4.2 3.9  CL 101 106 104 103 105  CO2 29 29 27 29 24   GLUCOSE 202* 137* 171* 151* 175*  BUN 15 16 14 14 19   CREATININE 1.22* 1.25* 1.28* 1.17* 1.17*  CALCIUM 9.4 9.1 9.6 9.2 9.1  MG 2.0 2.0 2.2  --   --    CBC:  Recent Labs Lab 02/10/17 0633 02/14/17 0445 02/16/17 0527  WBC 12.8* 11.9* 10.4  HGB 12.6 12.1 12.1  HCT 40.2 38.5 38.0  MCV 92.6 91.7 91.1  PLT 257 262 278    Cardiac Enzymes:  Recent Labs Lab 02/09/17 1916 02/10/17 0044 02/10/17 0633  TROPONINI 0.03* 0.03* 0.03*    CBG:  Recent Labs Lab 02/15/17 1200 02/15/17 1632 02/15/17 2021 02/16/17 0808 02/16/17 1141  GLUCAP 195* 168* 161* 129* 206*    Recent Results (from the past 240 hour(s))  MRSA PCR Screening     Status: None   Collection Time: 02/09/17  6:38 PM  Result Value Ref Range Status   MRSA by PCR NEGATIVE NEGATIVE Final  Comment:        The GeneXpert MRSA Assay (FDA approved for NASAL specimens only), is one component of a comprehensive MRSA  colonization surveillance program. It is not intended to diagnose MRSA infection nor to guide or monitor treatment for MRSA infections.   Culture, Urine     Status: Abnormal   Collection Time: 02/12/17  5:25 AM  Result Value Ref Range Status   Specimen Description URINE, RANDOM  Final   Special Requests NONE  Final   Culture <10,000 COLONIES/mL INSIGNIFICANT GROWTH (A)  Final   Report Status 02/14/2017 FINAL  Final  Culture, blood (routine x 2)     Status: None (Preliminary result)   Collection Time: 02/13/17 12:20 AM  Result Value Ref Range Status   Specimen Description BLOOD RIGHT HAND  Final   Special Requests   Final    BOTTLES DRAWN AEROBIC AND ANAEROBIC Blood Culture adequate volume   Culture NO GROWTH 3 DAYS  Final   Report Status PENDING  Incomplete  Culture, blood (routine x 2)     Status: None (Preliminary result)   Collection Time: 02/13/17 12:30 AM  Result Value Ref Range Status   Specimen Description BLOOD LEFT ARM  Final   Special Requests   Final    BOTTLES DRAWN AEROBIC AND ANAEROBIC Blood Culture adequate volume   Culture NO GROWTH 3 DAYS  Final   Report Status PENDING  Incomplete     Time spent in discharge (includes decision making & examination of pt): 35 minutes  02/16/2017, 2:36 PM   Cherene Altes, MD [facility] Office  [phone] Pager [phone]  On-Call/Text Page:      [email or web address]      password [facility]

## 2017-02-16 NOTE — Progress Notes (Signed)
Pt refused cpap

## 2017-02-16 NOTE — Progress Notes (Signed)
Discharge instructions (including medications) discussed with and copy provided to patient/caregiver along with paper prescriptions. 

## 2017-02-17 ENCOUNTER — Encounter: Payer: Self-pay | Admitting: *Deleted

## 2017-02-17 DIAGNOSIS — J8410 Pulmonary fibrosis, unspecified: Secondary | ICD-10-CM | POA: Diagnosis not present

## 2017-02-17 DIAGNOSIS — E669 Obesity, unspecified: Secondary | ICD-10-CM | POA: Diagnosis not present

## 2017-02-17 DIAGNOSIS — Z9981 Dependence on supplemental oxygen: Secondary | ICD-10-CM | POA: Diagnosis not present

## 2017-02-17 DIAGNOSIS — G4733 Obstructive sleep apnea (adult) (pediatric): Secondary | ICD-10-CM | POA: Diagnosis not present

## 2017-02-17 DIAGNOSIS — E1142 Type 2 diabetes mellitus with diabetic polyneuropathy: Secondary | ICD-10-CM | POA: Diagnosis not present

## 2017-02-17 DIAGNOSIS — I5031 Acute diastolic (congestive) heart failure: Secondary | ICD-10-CM | POA: Diagnosis not present

## 2017-02-17 DIAGNOSIS — I4891 Unspecified atrial fibrillation: Secondary | ICD-10-CM | POA: Diagnosis not present

## 2017-02-17 DIAGNOSIS — J9611 Chronic respiratory failure with hypoxia: Secondary | ICD-10-CM | POA: Diagnosis not present

## 2017-02-17 DIAGNOSIS — I130 Hypertensive heart and chronic kidney disease with heart failure and stage 1 through stage 4 chronic kidney disease, or unspecified chronic kidney disease: Secondary | ICD-10-CM | POA: Diagnosis not present

## 2017-02-17 DIAGNOSIS — E1122 Type 2 diabetes mellitus with diabetic chronic kidney disease: Secondary | ICD-10-CM | POA: Diagnosis not present

## 2017-02-17 DIAGNOSIS — Z524 Kidney donor: Secondary | ICD-10-CM | POA: Diagnosis not present

## 2017-02-17 DIAGNOSIS — Z905 Acquired absence of kidney: Secondary | ICD-10-CM | POA: Diagnosis not present

## 2017-02-17 DIAGNOSIS — I272 Pulmonary hypertension, unspecified: Secondary | ICD-10-CM | POA: Diagnosis not present

## 2017-02-17 DIAGNOSIS — M069 Rheumatoid arthritis, unspecified: Secondary | ICD-10-CM | POA: Diagnosis not present

## 2017-02-17 DIAGNOSIS — J449 Chronic obstructive pulmonary disease, unspecified: Secondary | ICD-10-CM | POA: Diagnosis not present

## 2017-02-17 DIAGNOSIS — F419 Anxiety disorder, unspecified: Secondary | ICD-10-CM | POA: Diagnosis not present

## 2017-02-17 DIAGNOSIS — H3530 Unspecified macular degeneration: Secondary | ICD-10-CM | POA: Diagnosis not present

## 2017-02-17 DIAGNOSIS — Z87440 Personal history of urinary (tract) infections: Secondary | ICD-10-CM | POA: Diagnosis not present

## 2017-02-17 DIAGNOSIS — N182 Chronic kidney disease, stage 2 (mild): Secondary | ICD-10-CM | POA: Diagnosis not present

## 2017-02-17 DIAGNOSIS — Z6836 Body mass index (BMI) 36.0-36.9, adult: Secondary | ICD-10-CM | POA: Diagnosis not present

## 2017-02-17 DIAGNOSIS — K317 Polyp of stomach and duodenum: Secondary | ICD-10-CM | POA: Diagnosis not present

## 2017-02-17 DIAGNOSIS — F329 Major depressive disorder, single episode, unspecified: Secondary | ICD-10-CM | POA: Diagnosis not present

## 2017-02-18 ENCOUNTER — Encounter: Payer: Self-pay | Admitting: Cardiology

## 2017-02-18 ENCOUNTER — Telehealth: Payer: Self-pay | Admitting: *Deleted

## 2017-02-18 ENCOUNTER — Ambulatory Visit (INDEPENDENT_AMBULATORY_CARE_PROVIDER_SITE_OTHER): Payer: Medicare Other | Admitting: Cardiology

## 2017-02-18 VITALS — BP 124/83 | HR 84 | Ht 68.0 in | Wt 243.4 lb

## 2017-02-18 DIAGNOSIS — R001 Bradycardia, unspecified: Secondary | ICD-10-CM

## 2017-02-18 DIAGNOSIS — I4891 Unspecified atrial fibrillation: Secondary | ICD-10-CM | POA: Diagnosis not present

## 2017-02-18 LAB — CULTURE, BLOOD (ROUTINE X 2)
Culture: NO GROWTH
Culture: NO GROWTH
Special Requests: ADEQUATE
Special Requests: ADEQUATE

## 2017-02-18 MED ORDER — METOPROLOL SUCCINATE ER 50 MG PO TB24: ORAL_TABLET | ORAL | 3 refills | Status: DC

## 2017-02-18 NOTE — Progress Notes (Signed)
Clinical Summary Lorraine Leonard is a 76 y.o.female seen today for follow up of the following medical problems. This is a focused visit on her history of afib.   1. Bradycardia/Afib - cardioversion 01/30/17 for afib - Since that time significant fatigue, SOB/DOE, and weight gain - she has lowered the dose of her Toprol to 100mg  daily on her own without imrpvoment of symptoms  - last visit we stopped her Toprol due to bradycardia in the 40s while in SR - recurrent afib with RVR, admitted and evaluted by EP - concerns about her GI status. 09/2016 she had EGD, single gastric polyp removed.  - seen 12/2016 for DCCV, postponed due to anemia and +FOBT - 12/2016 GI appt ok with restarting eliquis. - normal nuclear study 2014. There was thought based on her ST/T changes with high rates she may have CAD. A repeat echo also showed wall measurements of 1.3-1.5 and flecanide was stopped I had started flecanide on patient, there was some concern about her LVH and potential for CAD and it was stopped. Per EP notes best option is tikasyn for patient -  DCCV not performed due to patient had missed a prior dose of anticoagulation. Also concern about repeat EGD with plans for possible repeat biopsy.   - no recent palpitations. Can have some dizziness. +fatigue - no missed doses of anticoagulation since discharge.     Past Medical History:  Diagnosis Date  . Acute diastolic CHF (congestive heart failure) ([place]) 11/11/2016  . Allergic rhinitis   . Anxiety   . Anxiety   . Arthritis   . Atrial fibrillation ([place]) 09/2016  . COPD (chronic obstructive pulmonary disease) ([place])   . Depression   . Diabetes mellitus    pre-diabetic  . Diverticulosis   . Dyspnea   . Dysrhythmia   . GERD (gastroesophageal reflux disease)   . Gout   . Hyperlipidemia   . Hypertension   . Hypothyroidism   . 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 Macular degeneration   . Neuropathy   . Pulmonary fibrosis ([place])   . Rheumatoid  arthritis(714.0)   . Sleep apnea    uses 3 liters of o2 24/7  . Urticaria      Allergies  Allergen Reactions  . Allopurinol Swelling    Feet and legs swell  . Mobic [Meloxicam] Swelling and Other (See Comments)    Feet and Leg swelling     Current Outpatient Prescriptions  Medication Sig Dispense Refill  . azelastine (ASTELIN) 0.1 % nasal spray PLACE 2 SPRAYS INTO BOTH NOSTRILS 2 (TWO) TIMES DAILY AS NEEDED. 30 mL 5  . calcium citrate (CALCITRATE - DOSED IN MG ELEMENTAL CALCIUM) 950 MG tablet Take 1 tablet by mouth 2 (two) times daily.     . Cholecalciferol 2000 units CAPS Take 2,000 Units by mouth daily.     Marland Kitchen conjugated estrogens (PREMARIN) vaginal cream Place 0.5 g vaginally every Thursday. Use as directed    . cyanocobalamin 500 MCG tablet Take 500 mcg by mouth daily.     . diazepam (VALIUM) 5 MG tablet TAKE 1 TABLET TWICE A DAY AS NEEDED FOR ANXIETY (Patient taking differently: Take 5 mg by mouth 2 (two) times daily as needed for anxiety. ) 60 tablet 5  . diltiazem (CARDIZEM CD) 180 MG 24 hr capsule Take 1 capsule (180 mg total) by mouth 2 (two) times daily. 60 capsule 0  . docusate sodium (COLACE) 100 MG capsule Take 200 mg by mouth  daily as needed for mild constipation.    Marland Kitchen ELIQUIS 5 MG TABS tablet TAKE 1 TABLET BY MOUTH TWICE A DAY 60 tablet 1  . EPINEPHrine 0.3 mg/0.3 mL IJ SOAJ injection Inject 0.3 mg into the muscle once as needed (For anaphylaxis.).    Marland Kitchen esomeprazole (NEXIUM) 40 MG capsule Take 40 mg by mouth daily.     . ferrous sulfate 325 (65 FE) MG tablet Take 325 mg by mouth 2 (two) times daily with a meal.    . fluticasone (FLONASE) 50 MCG/ACT nasal spray PLACE 1 SPRAY INTO BOTH NOSTRILS 2 (TWO) TIMES DAILY AS NEEDED FOR ALLERGIES OR RHINITIS. 16 g 2  . furosemide (LASIX) 40 MG tablet Take 40 mg ( 1 Tablet) Alternating with 60 mg ( 1 1/2 Tablet) Daily 90 tablet 6  . INCRUSE ELLIPTA 62.5 MCG/INH AEPB TAKE 1 PUFF BY MOUTH EVERY DAY  3  . levalbuterol (XOPENEX) 1.25  MG/0.5ML nebulizer solution Take 1.25 mg by nebulization every 4 (four) hours as needed for wheezing or shortness of breath. 30 each 0  . levocetirizine (XYZAL) 5 MG tablet Take 5 mg by mouth every evening.     Marland Kitchen levothyroxine (SYNTHROID, LEVOTHROID) 100 MCG tablet TAKE 1 TABLET (100 MCG TOTAL) BY MOUTH DAILY BEFORE BREAKFAST. 90 tablet 1  . metoprolol succinate (TOPROL-XL) 50 MG 24 hr tablet Take 1 tablet (50 mg total) by mouth daily. Take with or immediately following a meal. 30 tablet 0  . mometasone (ELOCON) 0.1 % cream APPLY TO AFFECTED AREA DAILY 45 g 2  . Multiple Vitamins-Minerals (PRESERVISION AREDS 2 PO) Take 1 tablet by mouth 2 (two) times daily.    . ondansetron (ZOFRAN) 8 MG tablet Take 8 mg by mouth every 8 (eight) hours as needed for nausea or vomiting.    Marland Kitchen OVER THE COUNTER MEDICATION Place 1 drop into both eyes as needed (For dry eyes.). Systane Gel Drops    . PATADAY 0.2 % SOLN Place 1 drop into both eyes daily.    . polyethylene glycol powder (GLYCOLAX/MIRALAX) powder TAKE 17 G BY MOUTH 2 (TWO) TIMES DAILY AS NEEDED. 3162 g 0  . predniSONE (DELTASONE) 10 MG tablet Take 10 mg by mouth daily with breakfast.    . rosuvastatin (CRESTOR) 10 MG tablet TAKE 1 TABLET BY MOUTH AT BEDTIME 30 tablet 0  . spironolactone (ALDACTONE) 25 MG tablet Take 1 tablet (25 mg total) by mouth daily. 30 tablet 0  . traMADol (ULTRAM) 50 MG tablet TAKE 1 TABLET BY MOUTH EVERY 6 HOURS AS NEEDED (Patient taking differently: TAKE 1 TABLET BY MOUTH EVERY 6 HOURS AS NEEDED FOR PAIN) 90 tablet 2  . VIGAMOX 0.5 % ophthalmic solution Apply 1 drop to eye as needed (after eye injection).   12  . vitamin C (ASCORBIC ACID) 500 MG tablet Take 500 mg by mouth daily.    . Vitamin D, Ergocalciferol, (DRISDOL) 50000 units CAPS capsule TAKE 1 CAPSULE (50,000 UNITS TOTAL) BY MOUTH EVERY 7 (SEVEN) DAYS. 12 capsule 0  . vitamin E 400 UNIT capsule Take 400 Units by mouth daily.     No current facility-administered medications  for this visit.      Past Surgical History:  Procedure Laterality Date  . CARDIOVERSION N/A 01/30/2017   Procedure: CARDIOVERSION;  Surgeon: Josue Hector, MD;  Location: [facility];  Service: Cardiovascular;  Laterality: N/A;  . CATARACT EXTRACTION, BILATERAL Bilateral   . EUS N/A 10/03/2015   Procedure: UPPER ENDOSCOPIC ULTRASOUND (EUS)  RADIAL;  Surgeon: Arta Silence, MD;  Location: [facility] END[facility];  Service: Endoscopy;  Laterality: N/A;  . FINE NEEDLE ASPIRATION N/A 10/03/2015   Procedure: FINE NEEDLE ASPIRATION (FNA) RADIAL;  Surgeon: Arta Silence, MD;  Location: [facility] ENDOSCOPY;  Service: Endoscopy;  Laterality: N/A;  . 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 LUMBAR DISC SURGERY     x 2  . TUBAL LIGATION    . VAGINAL HYSTERECTOMY       Allergies  Allergen Reactions  . Allopurinol Swelling    Feet and legs swell  . Mobic [Meloxicam] Swelling and Other (See Comments)    Feet and Leg swelling      Family History  Problem Relation Age of Onset  . Diabetes Unknown   . Hypertension Unknown   . Coronary artery disease Unknown   . Stroke Unknown   . Asthma Unknown   . Kidney disease Sister   . Lung cancer Sister   . Lung cancer Sister   . Suicidality Son        committed suicide in 2009  . Hyperlipidemia Unknown   . Anxiety disorder Unknown   . Migraines Unknown      Social History Ms. Hunsinger reports that she has never smoked. She has never used smokeless tobacco. Ms. Lotspeich reports that she does not drink alcohol.   Review of Systems CONSTITUTIONAL: No weight loss, fever, chills, weakness or fatigue.  HEENT: Eyes: No visual loss, blurred vision, double vision or yellow sclerae.No hearing loss, sneezing, congestion, runny nose or sore throat.  SKIN: No rash or itching.  CARDIOVASCULAR: per hpi RESPIRATORY: No shortness of breath, cough or sputum.  GASTROINTESTINAL: No anorexia, nausea, vomiting or diarrhea. No abdominal pain or blood.    GENITOURINARY: No burning on urination, no polyuria NEUROLOGICAL: No headache, dizziness, syncope, paralysis, ataxia, numbness or tingling in the extremities. No change in bowel or bladder control.  MUSCULOSKELETAL: No muscle, back pain, joint pain or stiffness.  LYMPHATICS: No enlarged nodes. No history of splenectomy.  PSYCHIATRIC: No history of depression or anxiety.  ENDOCRINOLOGIC: No reports of sweating, cold or heat intolerance. No polyuria or polydipsia.  Marland Kitchen   Physical Exam Vitals:   02/18/17 1305  BP: 124/83  Pulse: 84  SpO2: 98%   Vitals:   02/18/17 1305  Weight: 243 lb 6.4 oz (110.4 kg)  Height: 5\' 8"  (1.727 m)    Gen: resting comfortably, no acute distress HEENT: no scleral icterus, pupils equal round and reactive, no palptable cervical adenopathy,  CV: irreg, rate 100 , no m/r/g Resp: Clear to auscultation bilaterally GI: abdomen is soft, non-tender, non-distended, normal bowel sounds, no hepatosplenomegaly MSK: extremities are warm, no edema.  Skin: warm, no rash Neuro:  no focal deficits Psych: appropriate affect   Diagnostic Studies 11/2015 echo Study Conclusions  - Left ventricle: The cavity size was normal. There was severe focal basal hypertrophy of the septum. Systolic function was vigorous. The estimated ejection fraction was in the range of 65% to 70%. Wall motion was normal; there were no regional wall motion abnormalities. - Mitral valve: Moderately calcified annulus. There was mild regurgitation. - Left atrium: Volume/bsa, ES (1-plane Simpson&'s, A4C): 26.5 ml/m^2. - Right ventricle: The cavity size was normal. Wall thickness was mildly increased. - Pulmonary arteries: Systolic pressure was within the normal range.   09/2016 echo Study Conclusions  - Left ventricle: The cavity size was normal. Wall thickness was increased in a pattern of mild LVH.  Systolic function was normal. The estimated ejection fraction was  in the range of 60% to 65%. Wall motion was normal; there were no regional wall motion abnormalities. Features are consistent with a pseudonormal left ventricular filling pattern, with concomitant abnormal relaxation and increased filling pressure (grade 2 diastolic dysfunction). - Aortic valve: Mildly calcified annulus. Trileaflet. - Mitral valve: Calcified annulus. - Right atrium: Central venous pressure (est): 3 mm Hg. - Atrial septum: No defect or patent foramen ovale was identified. - Tricuspid valve: There was trivial regurgitation. - Pulmonary arteries: PA peak pressure: 25 mm Hg (S). - Pericardium, extracardiac: A prominent pericardial fat pad was present.  Impressions:  - Mild LVH with LVEF 60-65% and grade 2 diastolic dysfunction. Calcified mitral annulus. Mildly sclerotic aortic valve. Trivial tricuspid regurgitation with normal PASP estimated 25 mmHg. Prominent pericardial fat pad noted.     Assessment and Plan  1. Bradycardia/fatigue/Afib -continue rate control, she is to follow up with EP to reconsider possible tikasyn after recent inpatient evaluation - ekg shows afib mild elvation in rates, we will increase Toprol to 37.5mg  daily   F/u 2 months      Arnoldo Lenis, M.D.

## 2017-02-18 NOTE — Patient Instructions (Signed)
Medication Instructions:  INCREASE TOPROL XL TO 75 MG DAILY   Labwork: NONE  Testing/Procedures: NONE  Follow-Up: Your physician recommends that you schedule a follow-up appointment in: 2 MONTHS    Any Other Special Instructions Will Be Listed Below (If Applicable).  You have been referred to DR. Lovena Le     If you need a refill on your cardiac medications before your next appointment, please call your pharmacy.

## 2017-02-18 NOTE — Telephone Encounter (Signed)
VM from [place] with [facility] Xopenex prescription is expensive Please advise of alternative

## 2017-02-19 ENCOUNTER — Telehealth: Payer: Self-pay | Admitting: Family

## 2017-02-19 DIAGNOSIS — D500 Iron deficiency anemia secondary to blood loss (chronic): Secondary | ICD-10-CM | POA: Diagnosis not present

## 2017-02-20 ENCOUNTER — Ambulatory Visit (INDEPENDENT_AMBULATORY_CARE_PROVIDER_SITE_OTHER): Payer: Medicare Other | Admitting: Internal Medicine

## 2017-02-20 ENCOUNTER — Encounter: Payer: Self-pay | Admitting: Internal Medicine

## 2017-02-20 ENCOUNTER — Other Ambulatory Visit: Payer: Self-pay

## 2017-02-20 ENCOUNTER — Telehealth: Payer: Self-pay

## 2017-02-20 VITALS — BP 132/70 | HR 83 | Ht 68.0 in | Wt 244.0 lb

## 2017-02-20 DIAGNOSIS — I480 Paroxysmal atrial fibrillation: Secondary | ICD-10-CM

## 2017-02-20 MED ORDER — METOPROLOL SUCCINATE ER 50 MG PO TB24: 50.0000 mg | ORAL_TABLET | Freq: Every day | ORAL | 3 refills | Status: DC

## 2017-02-20 MED ORDER — FLECAINIDE ACETATE 150 MG PO TABS: 75.0000 mg | ORAL_TABLET | Freq: Two times a day (BID) | ORAL | 11 refills | Status: DC

## 2017-02-20 MED ORDER — ALBUTEROL SULFATE (2.5 MG/3ML) 0.083% IN NEBU: 2.5000 mg | INHALATION_SOLUTION | Freq: Four times a day (QID) | RESPIRATORY_TRACT | 1 refills | Status: DC | PRN

## 2017-02-20 NOTE — Telephone Encounter (Signed)
Detailed message left for patient.

## 2017-02-20 NOTE — Telephone Encounter (Signed)
Patient missed visit today for [facility]

## 2017-02-20 NOTE — Progress Notes (Signed)
HPI [name] is referred today by Dr. Harl Bowie for evaluation and management of atrial fibrillation. She is a pleasant obese 76 year old woman with a diagnosis of pulmonary fibrosis, hypertension, and atrial fibrillation with a rapid ventricular response. She also has some sinus node dysfunction. She has not had syncope. When I ask her about symptoms regarding palpitations she actually has only minimal symptoms. She has not had anginal symptoms. No recent peripheral edema. When she saw Dr. Harl Bowie last week, flecainide was prescribed but she did not obtain the prescription. It seems that there was some confusion about what she was supposed to be taking. Her dose of Toprol was increased. Allergies  Allergen Reactions  . Allopurinol Swelling    Feet and legs swell  . Mobic [Meloxicam] Swelling and Other (See Comments)    Feet and Leg swelling     Current Outpatient Prescriptions  Medication Sig Dispense Refill  . albuterol (PROVENTIL) (2.5 MG/3ML) 0.083% nebulizer solution Take 3 mLs (2.5 mg total) by nebulization every 6 (six) hours as needed for wheezing or shortness of breath. 150 mL 1  . azelastine (ASTELIN) 0.1 % nasal spray PLACE 2 SPRAYS INTO BOTH NOSTRILS 2 (TWO) TIMES DAILY AS NEEDED. 30 mL 5  . calcium citrate (CALCITRATE - DOSED IN MG ELEMENTAL CALCIUM) 950 MG tablet Take 1 tablet by mouth 2 (two) times daily.     . Cholecalciferol 2000 units CAPS Take 2,000 Units by mouth daily.     Marland Kitchen conjugated estrogens (PREMARIN) vaginal cream Place 0.5 g vaginally every Thursday. Use as directed    . cyanocobalamin 500 MCG tablet Take 500 mcg by mouth daily.     . diazepam (VALIUM) 5 MG tablet TAKE 1 TABLET TWICE A DAY AS NEEDED FOR ANXIETY (Patient taking differently: Take 5 mg by mouth 2 (two) times daily as needed for anxiety. ) 60 tablet 5  . diltiazem (CARDIZEM CD) 180 MG 24 hr capsule Take 1 capsule (180 mg total) by mouth 2 (two) times daily. 60 capsule 0  . docusate sodium (COLACE)  100 MG capsule Take 200 mg by mouth daily as needed for mild constipation.    Marland Kitchen ELIQUIS 5 MG TABS tablet TAKE 1 TABLET BY MOUTH TWICE A DAY 60 tablet 1  . EPINEPHrine 0.3 mg/0.3 mL IJ SOAJ injection Inject 0.3 mg into the muscle once as needed (For anaphylaxis.).    Marland Kitchen esomeprazole (NEXIUM) 40 MG capsule Take 40 mg by mouth daily.     Marland Kitchen FeFum-FePoly-FA-B Cmp-C-Biot (FOLIVANE-PLUS) CAPS Take 1 capsule by mouth 2 (two) times daily.  1  . ferrous sulfate 325 (65 FE) MG tablet Take 325 mg by mouth 2 (two) times daily with a meal.    . fluticasone (FLONASE) 50 MCG/ACT nasal spray PLACE 1 SPRAY INTO BOTH NOSTRILS 2 (TWO) TIMES DAILY AS NEEDED FOR ALLERGIES OR RHINITIS. 16 g 2  . furosemide (LASIX) 40 MG tablet Take 40 mg ( 1 Tablet) Alternating with 60 mg ( 1 1/2 Tablet) Daily 90 tablet 6  . INCRUSE ELLIPTA 62.5 MCG/INH AEPB TAKE 1 PUFF BY MOUTH EVERY DAY  3  . levocetirizine (XYZAL) 5 MG tablet Take 5 mg by mouth every evening.     Marland Kitchen levothyroxine (SYNTHROID, LEVOTHROID) 100 MCG tablet TAKE 1 TABLET (100 MCG TOTAL) BY MOUTH DAILY BEFORE BREAKFAST. 90 tablet 1  . metoprolol succinate (TOPROL-XL) 50 MG 24 hr tablet Take 1 1/2 TABLETS DAILY  with or immediately following a meal. 45 tablet 3  .  mometasone (ELOCON) 0.1 % cream APPLY TO AFFECTED AREA DAILY 45 g 2  . Multiple Vitamins-Minerals (PRESERVISION AREDS 2 PO) Take 1 tablet by mouth 2 (two) times daily.    . ondansetron (ZOFRAN) 8 MG tablet Take 8 mg by mouth every 8 (eight) hours as needed for nausea or vomiting.    Marland Kitchen OVER THE COUNTER MEDICATION Place 1 drop into both eyes as needed (For dry eyes.). Systane Gel Drops    . PATADAY 0.2 % SOLN Place 1 drop into both eyes daily.    . polyethylene glycol powder (GLYCOLAX/MIRALAX) powder TAKE 17 G BY MOUTH 2 (TWO) TIMES DAILY AS NEEDED. 3162 g 0  . predniSONE (DELTASONE) 10 MG tablet Take 10 mg by mouth daily with breakfast.    . rosuvastatin (CRESTOR) 10 MG tablet TAKE 1 TABLET BY MOUTH AT BEDTIME 30  tablet 0  . spironolactone (ALDACTONE) 25 MG tablet Take 1 tablet (25 mg total) by mouth daily. 30 tablet 0  . traMADol (ULTRAM) 50 MG tablet TAKE 1 TABLET BY MOUTH EVERY 6 HOURS AS NEEDED (Patient taking differently: TAKE 1 TABLET BY MOUTH EVERY 6 HOURS AS NEEDED FOR PAIN) 90 tablet 2  . VIGAMOX 0.5 % ophthalmic solution Apply 1 drop to eye as needed (after eye injection).   12  . vitamin C (ASCORBIC ACID) 500 MG tablet Take 500 mg by mouth daily.    . Vitamin D, Ergocalciferol, (DRISDOL) 50000 units CAPS capsule TAKE 1 CAPSULE (50,000 UNITS TOTAL) BY MOUTH EVERY 7 (SEVEN) DAYS. 12 capsule 0  . vitamin E 400 UNIT capsule Take 400 Units by mouth daily.     No current facility-administered medications for this visit.      Past Medical History:  Diagnosis Date  . Acute diastolic CHF (congestive heart failure) ([place]) 11/11/2016  . Allergic rhinitis   . Anxiety   . Anxiety   . Arthritis   . Atrial fibrillation ([place]) 09/2016  . COPD (chronic obstructive pulmonary disease) ([place])   . Depression   . Diabetes mellitus    pre-diabetic  . Diverticulosis   . Dyspnea   . Dysrhythmia   . GERD (gastroesophageal reflux disease)   . Gout   . Hyperlipidemia   . Hypertension   . Hypothyroidism   . Lung fibrosis ([place])   . Macular degeneration   . Neuropathy   . Pulmonary fibrosis ([place])   . Rheumatoid arthritis(714.0)   . Sleep apnea    uses 3 liters of o2 24/7  . Urticaria     ROS:   All systems reviewed and negative except as noted in the HPI.   Past Surgical History:  Procedure Laterality Date  . CARDIOVERSION N/A 01/30/2017   Procedure: CARDIOVERSION;  Surgeon: Josue Hector, MD;  Location[facility];  Service: Cardiovascular;  Laterality: N/A;  . CATARACT EXTRACTION, BILATERAL Bilateral   . EUS N/A 10/03/2015   Procedure: UPPER ENDOSCOPIC ULTRASOUND (EUS) RADIAL;  Surgeon: Arta Silence, MD;  Location: [facility];  Service: Endoscopy;  Laterality: N/A;  . FINE NEEDLE  ASPIRATION N/A 10/03/2015   Procedure: FINE NEEDLE ASPIRATION (FNA) RADIAL;  Surgeon: Arta Silence, MD;  Location: [facility];  Service: Endoscopy;  Laterality: N/A;  . 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 LUMBAR DISC SURGERY     x 2  . TUBAL LIGATION    . VAGINAL HYSTERECTOMY       Family History  Problem Relation Age of Onset  .  Diabetes Unknown   . Hypertension Unknown   . Coronary artery disease Unknown   . Stroke Unknown   . Asthma Unknown   . Kidney disease Sister   . Lung cancer Sister   . Lung cancer Sister   . Suicidality Son        committed suicide in 2009  . Hyperlipidemia Unknown   . Anxiety disorder Unknown   . Migraines Unknown      Social History   Social History  . Marital status: Married    Spouse name: N/A  . Number of children: 4  . Years of education: N/A   Occupational History  . retired     [employer]   Social History Main Topics  . Smoking status: Never Smoker  . Smokeless tobacco: Never Used  . Alcohol use No  . Drug use: No  . Sexual activity: Not Currently    Birth control/ protection: Surgical   Other Topics Concern  . 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  Neck:  7 cm  JVD, no thyromegally Lymphatics:  No adenopathy Back:  No CVA tenderness Lungs:  Clear, with scattered rales in the bases with no wheezes or rhonchi.  HEART:  IRegular rate rhythm, no murmurs, no rubs, no clicks Abd:  soft, positive bowel sounds, no organomegally, no rebound, no guarding Ext:  2 plus pulses, no edema, no cyanosis, no clubbing Skin:  No rashes no nodules Neuro:  CN II through XII intact, motor grossly  intact  EKG - reviewed, atrial fibrillation with a controlled ventricular response    Assess/Plan: 1. Atrial fibrillation  - we discussed the treatment options. It's unclear what happened with the initial prescription of flecainide. However because of her large size I think 75 mg twice daily would be a more appropriate dose. Prescription has been given for this medication today, and she will come back in a week for 12-lead ECG nurse visit. I'm concerned about bradycardia and asked her to reduce her dose of Toprol back to 50 mg daily in conjunction with Cardizem.  2. Sinus node dysfunction  - she currently is stable but may ultimately end up needing permanent pacemaker insertion.  3. Pulmonary fibrosis  - she is on steroid therapy and is on oxygen by nasal cannula. She is scheduled for follow-up in the pulmonary clinic in several weeks.  4. Obesity  - I discussed the importance of losing weight with the patient.  5. Hypertension  - her blood pressure is reasonably well controlled today. I would not shoot for tight control with all of her medical problems and different treatments. She is encouraged to reduce her salt intake and lose weight.   Cristopher Peru, M.D.

## 2017-02-20 NOTE — Patient Instructions (Signed)
Medication Instructions:  Your physician has recommended you make the following change in your medication:  Decrease Toprol XL to 50 mg Daily  Start Flecainide 75 mg Two Times Daily    Labwork: NONE   Testing/Procedures: NONE   Follow-Up: Your physician recommends that you schedule a follow-up appointment in: 1 Week for EKG  Your physician recommends that you schedule a follow-up appointment on 04/06/17  With Dr. Lovena Le.     Any Other Special Instructions Will Be Listed Below (If Applicable).     If you need a refill on your cardiac medications before your next appointment, please call your pharmacy.  Thank you for choosing [facility]!

## 2017-02-20 NOTE — Telephone Encounter (Signed)
Albuterol Prescription sent to pharmacy   

## 2017-02-21 ENCOUNTER — Other Ambulatory Visit: Payer: Self-pay | Admitting: Family

## 2017-02-21 DIAGNOSIS — F419 Anxiety disorder, unspecified: Secondary | ICD-10-CM

## 2017-02-22 ENCOUNTER — Encounter: Payer: Self-pay | Admitting: Cardiology

## 2017-02-23 DIAGNOSIS — I5031 Acute diastolic (congestive) heart failure: Secondary | ICD-10-CM | POA: Diagnosis not present

## 2017-02-23 DIAGNOSIS — I4891 Unspecified atrial fibrillation: Secondary | ICD-10-CM | POA: Diagnosis not present

## 2017-02-23 DIAGNOSIS — J9611 Chronic respiratory failure with hypoxia: Secondary | ICD-10-CM | POA: Diagnosis not present

## 2017-02-23 DIAGNOSIS — E1142 Type 2 diabetes mellitus with diabetic polyneuropathy: Secondary | ICD-10-CM | POA: Diagnosis not present

## 2017-02-23 DIAGNOSIS — J8410 Pulmonary fibrosis, unspecified: Secondary | ICD-10-CM | POA: Diagnosis not present

## 2017-02-23 DIAGNOSIS — I130 Hypertensive heart and chronic kidney disease with heart failure and stage 1 through stage 4 chronic kidney disease, or unspecified chronic kidney disease: Secondary | ICD-10-CM | POA: Diagnosis not present

## 2017-02-23 NOTE — Telephone Encounter (Signed)
rx called into pharmacy

## 2017-02-23 NOTE — Telephone Encounter (Signed)
Last seen 12/25/16  Lorraine Leonard  If approved route to nurse to call into [facility]

## 2017-02-24 ENCOUNTER — Telehealth: Payer: Self-pay | Admitting: Cardiology

## 2017-02-24 DIAGNOSIS — J9611 Chronic respiratory failure with hypoxia: Secondary | ICD-10-CM | POA: Diagnosis not present

## 2017-02-24 DIAGNOSIS — I5031 Acute diastolic (congestive) heart failure: Secondary | ICD-10-CM | POA: Diagnosis not present

## 2017-02-24 DIAGNOSIS — E1142 Type 2 diabetes mellitus with diabetic polyneuropathy: Secondary | ICD-10-CM | POA: Diagnosis not present

## 2017-02-24 DIAGNOSIS — I130 Hypertensive heart and chronic kidney disease with heart failure and stage 1 through stage 4 chronic kidney disease, or unspecified chronic kidney disease: Secondary | ICD-10-CM | POA: Diagnosis not present

## 2017-02-24 DIAGNOSIS — J8410 Pulmonary fibrosis, unspecified: Secondary | ICD-10-CM | POA: Diagnosis not present

## 2017-02-24 DIAGNOSIS — I4891 Unspecified atrial fibrillation: Secondary | ICD-10-CM | POA: Diagnosis not present

## 2017-02-24 NOTE — Telephone Encounter (Signed)
[facility] [phone] would like verbal order to send [place] nurse for this pt

## 2017-02-24 NOTE — Telephone Encounter (Signed)
Lorraine Leonard with [facility] is seeing patient for physical therapy.She would like an order to have nursing home care to assist with her medications and weigh her.Lorraine states patient puts medication in boxes and out of pill bottle and doesn't always remember to take medications.

## 2017-02-25 DIAGNOSIS — I5031 Acute diastolic (congestive) heart failure: Secondary | ICD-10-CM | POA: Diagnosis not present

## 2017-02-25 DIAGNOSIS — I130 Hypertensive heart and chronic kidney disease with heart failure and stage 1 through stage 4 chronic kidney disease, or unspecified chronic kidney disease: Secondary | ICD-10-CM | POA: Diagnosis not present

## 2017-02-25 DIAGNOSIS — J8410 Pulmonary fibrosis, unspecified: Secondary | ICD-10-CM | POA: Diagnosis not present

## 2017-02-25 DIAGNOSIS — I4891 Unspecified atrial fibrillation: Secondary | ICD-10-CM | POA: Diagnosis not present

## 2017-02-25 DIAGNOSIS — J9611 Chronic respiratory failure with hypoxia: Secondary | ICD-10-CM | POA: Diagnosis not present

## 2017-02-25 DIAGNOSIS — E1142 Type 2 diabetes mellitus with diabetic polyneuropathy: Secondary | ICD-10-CM | POA: Diagnosis not present

## 2017-02-25 NOTE — Telephone Encounter (Signed)
Called [employer]. No answer. Left message to call back.

## 2017-02-25 NOTE — Telephone Encounter (Signed)
Ok to place the order   Zandra Abts MD

## 2017-02-26 NOTE — Telephone Encounter (Signed)
Spoke with [employer]. Notified her that it is ok for nursing order. Debbie voiced understanding.

## 2017-02-27 ENCOUNTER — Ambulatory Visit (INDEPENDENT_AMBULATORY_CARE_PROVIDER_SITE_OTHER): Payer: Medicare Other

## 2017-02-27 VITALS — HR 108 | Ht 70.0 in | Wt 246.0 lb

## 2017-02-27 DIAGNOSIS — I480 Paroxysmal atrial fibrillation: Secondary | ICD-10-CM | POA: Diagnosis not present

## 2017-02-27 NOTE — Patient Instructions (Signed)
I will email your EKG to Dr Lovena Le for review.  His office will call you for any changes

## 2017-02-28 ENCOUNTER — Encounter: Payer: Self-pay | Admitting: Family Medicine

## 2017-02-28 ENCOUNTER — Ambulatory Visit (INDEPENDENT_AMBULATORY_CARE_PROVIDER_SITE_OTHER): Payer: Medicare Other | Admitting: Family Medicine

## 2017-02-28 VITALS — BP 130/76 | HR 68 | Temp 97.2°F | Ht 70.0 in | Wt 247.0 lb

## 2017-02-28 DIAGNOSIS — R300 Dysuria: Secondary | ICD-10-CM | POA: Diagnosis not present

## 2017-02-28 DIAGNOSIS — N3000 Acute cystitis without hematuria: Secondary | ICD-10-CM

## 2017-02-28 LAB — MICROSCOPIC EXAMINATION: WBC, UA: >30 /hpf — AB (ref 0–?)

## 2017-02-28 LAB — URINALYSIS, COMPLETE
Bilirubin, UA: NEGATIVE
Glucose, UA: NEGATIVE
Ketones, UA: NEGATIVE
Nitrite, UA: NEGATIVE
Protein, UA: NEGATIVE
Specific Gravity, UA: 1.01 (ref 1.005–1.030)
Urobilinogen, Ur: 0.2 mg/dL (ref 0.2–1.0)
pH, UA: 6.5 (ref 5.0–7.5)

## 2017-02-28 MED ORDER — CEFDINIR 300 MG PO CAPS: 300.0000 mg | ORAL_CAPSULE | Freq: Two times a day (BID) | ORAL | 0 refills | Status: DC

## 2017-02-28 NOTE — Progress Notes (Signed)
BP 130/76   Pulse 68   Temp (!) 97.2 F (36.2 C) (Oral)   Ht 5\' 10"  (1.778 m)   Wt 247 lb (112 kg)   BMI 35.44 kg/m    Subjective:    Patient ID: Lorraine Leonard, female    DOB: Jul 25, 1940, 76 y.o.   MRN: [id number]  HPI: Lorraine Leonard is a 76 y.o. female presenting on 02/28/2017 for Urinary Tract Infection (burning with urination, urinary frequency x 1 day; )   HPI Dysuria and hematuria Patient comes in with dysuria and hematuria that started yesterday.  She says she is lactose has not been on it because the cardiologist took her off of it.  She says that whenever she goes off of it she usually gets a urinary tract infection like she has this time.  She says she has burning irritation and dysuria.  She denies any abdominal pain or fevers or chills or flank pain.  She says that she used to be on Trimpex for prophylaxis and does not know for sure where she was taken off of it.  Relevant past medical, surgical, family and social history reviewed and updated as indicated. Interim medical history since our last visit reviewed. Allergies and medications reviewed and updated.  Review of Systems  Constitutional: Negative for chills and fever.  Eyes: Negative for visual disturbance.  Respiratory: Negative for chest tightness and shortness of breath.   Cardiovascular: Negative for chest pain and leg swelling.  Gastrointestinal: Negative for abdominal pain.  Genitourinary: Positive for dysuria, frequency and urgency. Negative for difficulty urinating, flank pain, hematuria, vaginal bleeding, vaginal discharge and vaginal pain.  Musculoskeletal: Negative for back pain and gait problem.  Skin: Negative for rash.  Neurological: Negative for light-headedness and headaches.  Psychiatric/Behavioral: Negative for agitation and behavioral problems.  All other systems reviewed and are negative.   Per HPI unless specifically indicated above        Objective:    BP 130/76   Pulse 68   Temp  (!) 97.2 F (36.2 C) (Oral)   Ht 5\' 10"  (1.778 m)   Wt 247 lb (112 kg)   BMI 35.44 kg/m   Wt Readings from Last 3 Encounters:  02/28/17 247 lb (112 kg)  02/27/17 246 lb (111.6 kg)  02/20/17 244 lb (110.7 kg)    Physical Exam  Constitutional: She is oriented to person, place, and time. She appears well-developed and well-nourished. No distress.  Eyes: Conjunctivae are normal.  Abdominal: Soft. Bowel sounds are normal. She exhibits no distension. There is no tenderness. There is no rebound.  Musculoskeletal: Normal range of motion.  Neurological: She is alert and oriented to person, place, and time. Coordination normal.  Skin: Skin is warm and dry. No rash noted. She is not diaphoretic.  Psychiatric: She has a normal mood and affect. Her behavior is normal.  Nursing note and vitals reviewed.       Assessment & Plan:   Problem List Items Addressed This Visit    None    Visit Diagnoses    Acute cystitis without hematuria    -  Primary   Relevant Medications   cefdinir (OMNICEF) 300 MG capsule   Other Relevant Orders   Urinalysis, Complete   Urine Culture       Follow up plan: Return if symptoms worsen or fail to improve.  Counseling provided for all of the vaccine components Orders Placed This Encounter  Procedures  . Urine Culture  . Urinalysis,  Complete    Caryl Pina, MD [facility] 02/28/2017, 9:01 AM

## 2017-03-03 DIAGNOSIS — J9611 Chronic respiratory failure with hypoxia: Secondary | ICD-10-CM | POA: Diagnosis not present

## 2017-03-03 DIAGNOSIS — I4891 Unspecified atrial fibrillation: Secondary | ICD-10-CM | POA: Diagnosis not present

## 2017-03-03 DIAGNOSIS — I130 Hypertensive heart and chronic kidney disease with heart failure and stage 1 through stage 4 chronic kidney disease, or unspecified chronic kidney disease: Secondary | ICD-10-CM | POA: Diagnosis not present

## 2017-03-03 DIAGNOSIS — J8410 Pulmonary fibrosis, unspecified: Secondary | ICD-10-CM | POA: Diagnosis not present

## 2017-03-03 DIAGNOSIS — E1142 Type 2 diabetes mellitus with diabetic polyneuropathy: Secondary | ICD-10-CM | POA: Diagnosis not present

## 2017-03-03 DIAGNOSIS — I5031 Acute diastolic (congestive) heart failure: Secondary | ICD-10-CM | POA: Diagnosis not present

## 2017-03-03 LAB — URINE CULTURE

## 2017-03-04 DIAGNOSIS — I5031 Acute diastolic (congestive) heart failure: Secondary | ICD-10-CM | POA: Diagnosis not present

## 2017-03-04 DIAGNOSIS — I4891 Unspecified atrial fibrillation: Secondary | ICD-10-CM | POA: Diagnosis not present

## 2017-03-04 DIAGNOSIS — I130 Hypertensive heart and chronic kidney disease with heart failure and stage 1 through stage 4 chronic kidney disease, or unspecified chronic kidney disease: Secondary | ICD-10-CM | POA: Diagnosis not present

## 2017-03-04 DIAGNOSIS — J9611 Chronic respiratory failure with hypoxia: Secondary | ICD-10-CM | POA: Diagnosis not present

## 2017-03-04 DIAGNOSIS — J8410 Pulmonary fibrosis, unspecified: Secondary | ICD-10-CM | POA: Diagnosis not present

## 2017-03-04 DIAGNOSIS — E1142 Type 2 diabetes mellitus with diabetic polyneuropathy: Secondary | ICD-10-CM | POA: Diagnosis not present

## 2017-03-05 DIAGNOSIS — I5031 Acute diastolic (congestive) heart failure: Secondary | ICD-10-CM | POA: Diagnosis not present

## 2017-03-05 DIAGNOSIS — J8410 Pulmonary fibrosis, unspecified: Secondary | ICD-10-CM | POA: Diagnosis not present

## 2017-03-05 DIAGNOSIS — I4891 Unspecified atrial fibrillation: Secondary | ICD-10-CM | POA: Diagnosis not present

## 2017-03-05 DIAGNOSIS — I130 Hypertensive heart and chronic kidney disease with heart failure and stage 1 through stage 4 chronic kidney disease, or unspecified chronic kidney disease: Secondary | ICD-10-CM | POA: Diagnosis not present

## 2017-03-05 DIAGNOSIS — E1142 Type 2 diabetes mellitus with diabetic polyneuropathy: Secondary | ICD-10-CM | POA: Diagnosis not present

## 2017-03-05 DIAGNOSIS — J9611 Chronic respiratory failure with hypoxia: Secondary | ICD-10-CM | POA: Diagnosis not present

## 2017-03-06 DIAGNOSIS — E1142 Type 2 diabetes mellitus with diabetic polyneuropathy: Secondary | ICD-10-CM | POA: Diagnosis not present

## 2017-03-06 DIAGNOSIS — I130 Hypertensive heart and chronic kidney disease with heart failure and stage 1 through stage 4 chronic kidney disease, or unspecified chronic kidney disease: Secondary | ICD-10-CM | POA: Diagnosis not present

## 2017-03-06 DIAGNOSIS — J9611 Chronic respiratory failure with hypoxia: Secondary | ICD-10-CM | POA: Diagnosis not present

## 2017-03-06 DIAGNOSIS — I4891 Unspecified atrial fibrillation: Secondary | ICD-10-CM | POA: Diagnosis not present

## 2017-03-06 DIAGNOSIS — J8410 Pulmonary fibrosis, unspecified: Secondary | ICD-10-CM | POA: Diagnosis not present

## 2017-03-06 DIAGNOSIS — I5031 Acute diastolic (congestive) heart failure: Secondary | ICD-10-CM | POA: Diagnosis not present

## 2017-03-09 DIAGNOSIS — I5031 Acute diastolic (congestive) heart failure: Secondary | ICD-10-CM | POA: Diagnosis not present

## 2017-03-09 DIAGNOSIS — E1142 Type 2 diabetes mellitus with diabetic polyneuropathy: Secondary | ICD-10-CM | POA: Diagnosis not present

## 2017-03-09 DIAGNOSIS — J9611 Chronic respiratory failure with hypoxia: Secondary | ICD-10-CM | POA: Diagnosis not present

## 2017-03-09 DIAGNOSIS — I4891 Unspecified atrial fibrillation: Secondary | ICD-10-CM | POA: Diagnosis not present

## 2017-03-09 DIAGNOSIS — I130 Hypertensive heart and chronic kidney disease with heart failure and stage 1 through stage 4 chronic kidney disease, or unspecified chronic kidney disease: Secondary | ICD-10-CM | POA: Diagnosis not present

## 2017-03-09 DIAGNOSIS — J8410 Pulmonary fibrosis, unspecified: Secondary | ICD-10-CM | POA: Diagnosis not present

## 2017-03-10 ENCOUNTER — Telehealth: Payer: Self-pay | Admitting: Cardiology

## 2017-03-10 DIAGNOSIS — J9611 Chronic respiratory failure with hypoxia: Secondary | ICD-10-CM | POA: Diagnosis not present

## 2017-03-10 DIAGNOSIS — E1142 Type 2 diabetes mellitus with diabetic polyneuropathy: Secondary | ICD-10-CM | POA: Diagnosis not present

## 2017-03-10 DIAGNOSIS — I130 Hypertensive heart and chronic kidney disease with heart failure and stage 1 through stage 4 chronic kidney disease, or unspecified chronic kidney disease: Secondary | ICD-10-CM | POA: Diagnosis not present

## 2017-03-10 DIAGNOSIS — J8410 Pulmonary fibrosis, unspecified: Secondary | ICD-10-CM | POA: Diagnosis not present

## 2017-03-10 DIAGNOSIS — I5031 Acute diastolic (congestive) heart failure: Secondary | ICD-10-CM | POA: Diagnosis not present

## 2017-03-10 DIAGNOSIS — I4891 Unspecified atrial fibrillation: Secondary | ICD-10-CM | POA: Diagnosis not present

## 2017-03-10 NOTE — Telephone Encounter (Signed)
Per Tye Maryland Nurse w/ [facility] --weight up 10lbs since yesterday, 1 cm increase on each ankle, please give Tye Maryland a call @ [phone]

## 2017-03-10 NOTE — Telephone Encounter (Addendum)
I spoke with [facility] and she will speak with family of Ms.Klutts and adjust meds per Dr.branch's order

## 2017-03-10 NOTE — Telephone Encounter (Signed)
LM for [facility] nurse cathy to call back for instructions

## 2017-03-10 NOTE — Telephone Encounter (Signed)
I will forward to Dr.Branch for dispo 

## 2017-03-10 NOTE — Telephone Encounter (Signed)
Take lasix 40mg  bid x 3 days, then resume prior dosing. Update [place] Monday. If weight remains up can contiue the lasix 40mg  bid until MOnday.    Zandra Abts MD

## 2017-03-11 DIAGNOSIS — I4891 Unspecified atrial fibrillation: Secondary | ICD-10-CM | POA: Diagnosis not present

## 2017-03-11 DIAGNOSIS — J9611 Chronic respiratory failure with hypoxia: Secondary | ICD-10-CM | POA: Diagnosis not present

## 2017-03-11 DIAGNOSIS — I5031 Acute diastolic (congestive) heart failure: Secondary | ICD-10-CM | POA: Diagnosis not present

## 2017-03-11 DIAGNOSIS — I130 Hypertensive heart and chronic kidney disease with heart failure and stage 1 through stage 4 chronic kidney disease, or unspecified chronic kidney disease: Secondary | ICD-10-CM | POA: Diagnosis not present

## 2017-03-11 DIAGNOSIS — J8410 Pulmonary fibrosis, unspecified: Secondary | ICD-10-CM | POA: Diagnosis not present

## 2017-03-11 DIAGNOSIS — E1142 Type 2 diabetes mellitus with diabetic polyneuropathy: Secondary | ICD-10-CM | POA: Diagnosis not present

## 2017-03-13 DIAGNOSIS — I4891 Unspecified atrial fibrillation: Secondary | ICD-10-CM | POA: Diagnosis not present

## 2017-03-13 DIAGNOSIS — J8410 Pulmonary fibrosis, unspecified: Secondary | ICD-10-CM | POA: Diagnosis not present

## 2017-03-13 DIAGNOSIS — I130 Hypertensive heart and chronic kidney disease with heart failure and stage 1 through stage 4 chronic kidney disease, or unspecified chronic kidney disease: Secondary | ICD-10-CM | POA: Diagnosis not present

## 2017-03-13 DIAGNOSIS — E1142 Type 2 diabetes mellitus with diabetic polyneuropathy: Secondary | ICD-10-CM | POA: Diagnosis not present

## 2017-03-13 DIAGNOSIS — I5031 Acute diastolic (congestive) heart failure: Secondary | ICD-10-CM | POA: Diagnosis not present

## 2017-03-13 DIAGNOSIS — J9611 Chronic respiratory failure with hypoxia: Secondary | ICD-10-CM | POA: Diagnosis not present

## 2017-03-16 ENCOUNTER — Other Ambulatory Visit: Payer: Self-pay | Admitting: Family

## 2017-03-16 DIAGNOSIS — I5031 Acute diastolic (congestive) heart failure: Secondary | ICD-10-CM | POA: Diagnosis not present

## 2017-03-16 DIAGNOSIS — E1142 Type 2 diabetes mellitus with diabetic polyneuropathy: Secondary | ICD-10-CM | POA: Diagnosis not present

## 2017-03-16 DIAGNOSIS — I130 Hypertensive heart and chronic kidney disease with heart failure and stage 1 through stage 4 chronic kidney disease, or unspecified chronic kidney disease: Secondary | ICD-10-CM | POA: Diagnosis not present

## 2017-03-16 DIAGNOSIS — J8410 Pulmonary fibrosis, unspecified: Secondary | ICD-10-CM | POA: Diagnosis not present

## 2017-03-16 DIAGNOSIS — J9611 Chronic respiratory failure with hypoxia: Secondary | ICD-10-CM | POA: Diagnosis not present

## 2017-03-16 DIAGNOSIS — I4891 Unspecified atrial fibrillation: Secondary | ICD-10-CM | POA: Diagnosis not present

## 2017-03-18 DIAGNOSIS — J9611 Chronic respiratory failure with hypoxia: Secondary | ICD-10-CM | POA: Diagnosis not present

## 2017-03-18 DIAGNOSIS — E1142 Type 2 diabetes mellitus with diabetic polyneuropathy: Secondary | ICD-10-CM | POA: Diagnosis not present

## 2017-03-18 DIAGNOSIS — I130 Hypertensive heart and chronic kidney disease with heart failure and stage 1 through stage 4 chronic kidney disease, or unspecified chronic kidney disease: Secondary | ICD-10-CM | POA: Diagnosis not present

## 2017-03-18 DIAGNOSIS — I5031 Acute diastolic (congestive) heart failure: Secondary | ICD-10-CM | POA: Diagnosis not present

## 2017-03-18 DIAGNOSIS — I4891 Unspecified atrial fibrillation: Secondary | ICD-10-CM | POA: Diagnosis not present

## 2017-03-18 DIAGNOSIS — J8410 Pulmonary fibrosis, unspecified: Secondary | ICD-10-CM | POA: Diagnosis not present

## 2017-03-23 ENCOUNTER — Ambulatory Visit (HOSPITAL_COMMUNITY)
Admission: RE | Admit: 2017-03-23 | Discharge: 2017-03-23 | Disposition: A | Discharge status: Home / Self Care | Payer: Medicare Other | Source: Ambulatory Visit | Attending: Emergency Medicine | Admitting: Emergency Medicine

## 2017-03-23 DIAGNOSIS — J9611 Chronic respiratory failure with hypoxia: Secondary | ICD-10-CM | POA: Diagnosis not present

## 2017-03-23 DIAGNOSIS — I2510 Atherosclerotic heart disease of native coronary artery without angina pectoris: Secondary | ICD-10-CM | POA: Diagnosis not present

## 2017-03-23 DIAGNOSIS — E1142 Type 2 diabetes mellitus with diabetic polyneuropathy: Secondary | ICD-10-CM | POA: Diagnosis not present

## 2017-03-23 DIAGNOSIS — J849 Interstitial pulmonary disease, unspecified: Secondary | ICD-10-CM | POA: Diagnosis not present

## 2017-03-23 DIAGNOSIS — I130 Hypertensive heart and chronic kidney disease with heart failure and stage 1 through stage 4 chronic kidney disease, or unspecified chronic kidney disease: Secondary | ICD-10-CM | POA: Diagnosis not present

## 2017-03-23 DIAGNOSIS — I700 Atherosclerosis of aorta: Secondary | ICD-10-CM | POA: Insufficient documentation

## 2017-03-23 DIAGNOSIS — R0600 Dyspnea, unspecified: Secondary | ICD-10-CM | POA: Diagnosis not present

## 2017-03-23 DIAGNOSIS — I4891 Unspecified atrial fibrillation: Secondary | ICD-10-CM | POA: Diagnosis not present

## 2017-03-23 DIAGNOSIS — I7789 Other specified disorders of arteries and arterioles: Secondary | ICD-10-CM | POA: Diagnosis not present

## 2017-03-23 DIAGNOSIS — I5031 Acute diastolic (congestive) heart failure: Secondary | ICD-10-CM | POA: Diagnosis not present

## 2017-03-23 DIAGNOSIS — J8410 Pulmonary fibrosis, unspecified: Secondary | ICD-10-CM | POA: Diagnosis not present

## 2017-03-24 ENCOUNTER — Ambulatory Visit (INDEPENDENT_AMBULATORY_CARE_PROVIDER_SITE_OTHER): Payer: Medicare Other | Admitting: Family

## 2017-03-24 ENCOUNTER — Encounter: Payer: Self-pay | Admitting: Family

## 2017-03-24 VITALS — BP 116/68 | HR 82 | Temp 96.6°F | Ht 70.0 in | Wt 254.8 lb

## 2017-03-24 DIAGNOSIS — Z09 Encounter for follow-up examination after completed treatment for conditions other than malignant neoplasm: Secondary | ICD-10-CM | POA: Diagnosis not present

## 2017-03-24 DIAGNOSIS — I4819 Other persistent atrial fibrillation: Secondary | ICD-10-CM

## 2017-03-24 DIAGNOSIS — I5031 Acute diastolic (congestive) heart failure: Secondary | ICD-10-CM | POA: Diagnosis not present

## 2017-03-24 DIAGNOSIS — I4891 Unspecified atrial fibrillation: Secondary | ICD-10-CM | POA: Diagnosis not present

## 2017-03-24 DIAGNOSIS — I480 Paroxysmal atrial fibrillation: Secondary | ICD-10-CM | POA: Diagnosis not present

## 2017-03-24 DIAGNOSIS — J8410 Pulmonary fibrosis, unspecified: Secondary | ICD-10-CM

## 2017-03-24 DIAGNOSIS — I481 Persistent atrial fibrillation: Secondary | ICD-10-CM | POA: Diagnosis not present

## 2017-03-24 NOTE — Progress Notes (Signed)
Subjective:    Patient ID: Lorraine Leonard, female    DOB: 10-06-1940, 76 y.o.   MRN: [id number]  HPI PT presents to the office today for hospital follow up. Pt was admitted on 02/09/17 and discharged 02/16/17 for A Fib and CHF.  Pt is followed by Cardiologists. Her last visit was 02/20/17 with Dr. Lovena Le and was started on Flecainide. Pt states her heart rate does fluctuate, but denies any palpitations or chest pain. States she has SOB, but does have Pulmonary Fibrosis and is on 3 L continuous oxygen.    Pt has gained 10 lbs since her appt on 02/20/17. States she does have slightly more peripheral edema. PT states she is taking her Lasix daily, but is not on a low salt diet. States she weighs herself daily, but can not read the numbers that well. Pt states she thought her weight this morning was 243 lbs at home. States according her home scales she has gained 3 lbs over the last week. Pt does have Home health nurse that comes to her home weekly and per Epic reported a 10 lb weight gain on 03/10/17 and her Lasix was increased per Dr. Harl Bowie.    Review of Systems  Respiratory: Positive for shortness of breath.   Cardiovascular: Negative for palpitations.  Musculoskeletal: Positive for arthralgias.  All other systems reviewed and are negative.      Objective:   Physical Exam  Constitutional: She is oriented to person, place, and time. She appears well-developed and well-nourished. No distress.  HENT:  Head: Normocephalic.  Eyes: Pupils are equal, round, and reactive to light.  Neck: Normal range of motion. Neck supple. No thyromegaly present.  Cardiovascular: Normal rate, normal heart sounds and intact distal pulses. An irregular rhythm present.  No murmur heard. Pulmonary/Chest: Effort normal. No respiratory distress. She has decreased breath sounds. She has no wheezes.  3L of continuous oxygen   Abdominal: Soft. Bowel sounds are normal. She exhibits no distension. There is no  tenderness.  Musculoskeletal: Normal range of motion. She exhibits edema (2+ BLE). She exhibits no tenderness.  Neurological: She is alert and oriented to person, place, and time.  Skin: Skin is warm and dry.  Psychiatric: She has a normal mood and affect. Her behavior is normal. Judgment and thought content normal.  Vitals reviewed.   BP 116/68   Pulse 82   Temp (!) 96.6 F (35.9 C) (Oral)   Ht '5\' 10"'$  (1.778 m)   Wt 254 lb 12.8 oz (115.6 kg)   BMI 36.56 kg/m      Assessment & Plan:  .1. Acute diastolic CHF (congestive heart failure) (HCC) - BMP8+EGFR - CBC with Differential/Platelet  2. Paroxysmal atrial fibrillation (HCC) - BMP8+EGFR - CBC with Differential/Platelet  3. Persistent atrial fibrillation (HCC) - BMP8+EGFR - CBC with Differential/Platelet  4. Atrial fibrillation with rapid ventricular response (HCC)  - BMP8+EGFR - CBC with Differential/Platelet  5. Pulmonary fibrosis (HCC) - BMP8+EGFR - CBC with Differential/Platelet  6. Morbid obesity (HCC)  - BMP8+EGFR - CBC with Differential/Platelet  7. Hospital discharge follow-up - BMP8+EGFR - CBC with Differential/Platelet  Keep all appts with Cardiologists and Pulmonologist  Discussed importance of weighing herself daily, discussed that the next time [place] comes she needs to get her to weigh her. I am unsure if pt is getting correct weight by her husband readings. (states he does not have good vision) Pt is unsure her lasix dosing at this time and states her granddaughter does  her medications Strict low salt diet and weight daily Labs pending  Evelina Dun, FNP

## 2017-03-24 NOTE — Patient Instructions (Signed)
Heart Failure °Heart failure is a condition in which the heart has trouble pumping blood because it has become weak or stiff. This means that the heart does not pump blood efficiently for the body to work well. For some people with heart failure, fluid may back up into the lungs and there may be swelling (edema) in the lower legs. Heart failure is usually a long-term (chronic) condition. It is important for you to take good care of yourself and follow the treatment plan from your health care provider. °What are the causes? °This condition is caused by some health problems, including: °· High blood pressure (hypertension). Hypertension causes the heart muscle to work harder than normal. High blood pressure eventually causes the heart to become stiff and weak. °· Coronary artery disease (CAD). CAD is the buildup of cholesterol and fat (plaques) in the arteries of the heart. °· Heart attack (myocardial infarction). Injured tissue, which is caused by the heart attack, does not contract as well and the heart's ability to pump blood is weakened. °· Abnormal heart valves. When the heart valves do not open and close properly, the heart muscle must pump harder to keep the blood flowing. °· Heart muscle disease (cardiomyopathy or myocarditis). Heart muscle disease is damage to the heart muscle from a variety of causes, such as drug or alcohol abuse, infections, or unknown causes. These can increase the risk of heart failure. °· Lung disease. When the lungs do not work properly, the heart must work harder. ° °What increases the risk? °Risk of heart failure increases as a person ages. This condition is also more likely to develop in people who: °· Are overweight. °· Are female. °· Smoke or chew tobacco. °· Abuse alcohol or illegal drugs. °· Have taken medicines that can damage the heart, such as chemotherapy drugs. °· Have diabetes. °? High blood sugar (glucose) is associated with high fat (lipid) levels in the blood. °? Diabetes  can also damage tiny blood vessels that carry nutrients to the heart muscle. °· Have abnormal heart rhythms. °· Have thyroid problems. °· Have low blood counts (anemia). ° °What are the signs or symptoms? °Symptoms of this condition include: °· Shortness of breath with activity, such as when climbing stairs. °· Persistent cough. °· Swelling of the feet, ankles, legs, or abdomen. °· Unexplained weight gain. °· Difficulty breathing when lying flat (orthopnea). °· Waking from sleep because of the need to sit up and get more air. °· Rapid heartbeat. °· Fatigue and loss of energy. °· Feeling light-headed, dizzy, or close to fainting. °· Loss of appetite. °· Nausea. °· Increased urination during the night (nocturia). °· Confusion. ° °How is this diagnosed? °This condition is diagnosed based on: °· Medical history, symptoms, and a physical exam. °· Diagnostic tests, which may include: °? Echocardiogram. °? Electrocardiogram (ECG). °? Chest X-ray. °? Blood tests. °? Exercise stress test. °? Radionuclide scans. °? Cardiac catheterization and angiogram. ° °How is this treated? °Treatment for this condition is aimed at managing the symptoms of heart failure. Medicines, behavioral changes, or other treatments may be necessary to treat heart failure. °Medicines °These may include: °· Angiotensin-converting enzyme (ACE) inhibitors. This type of medicine blocks the effects of a blood protein called angiotensin-converting enzyme. ACE inhibitors relax (dilate) the blood vessels and help to lower blood pressure. °· Angiotensin receptor blockers (ARBs). This type of medicine blocks the actions of a blood protein called angiotensin. ARBs dilate the blood vessels and help to lower blood pressure. °· Water   pills (diuretics). Diuretics cause the kidneys to remove salt and water from the blood. The extra fluid is removed through urination, leaving a lower volume of blood that the heart has to pump. °· Beta blockers. These improve heart  muscle strength and they prevent the heart from beating too quickly. °· Digoxin. This increases the force of the heartbeat. ° °Healthy behavior changes °These may include: °· Reaching and maintaining a healthy weight. °· Stopping smoking or chewing tobacco. °· Eating heart-healthy foods. °· Limiting or avoiding alcohol. °· Stopping use of street drugs (illegal drugs). °· Physical activity. ° °Other treatments °These may include: °· Surgery to open blocked coronary arteries or repair damaged heart valves. °· Placement of a biventricular pacemaker to improve heart muscle function (cardiac resynchronization therapy). This device paces both the right ventricle and left ventricle. °· Placement of a device to treat serious abnormal heart rhythms (implantable cardioverter defibrillator, or ICD). °· Placement of a device to improve the pumping ability of the heart (left ventricular assist device, or LVAD). °· Heart transplant. This can cure heart failure, and it is considered for certain patients who do not improve with other therapies. ° °Follow these instructions at home: °Medicines °· Take over-the-counter and prescription medicines only as told by your health care provider. Medicines are important in reducing the workload of your heart, slowing the progression of heart failure, and improving your symptoms. °? Do not stop taking your medicine unless your health care provider told you to do that. °? Do not skip any dose of medicine. °? Refill your prescriptions before you run out of medicine. You need your medicines every day. °Eating and drinking ° °· Eat heart-healthy foods. Talk with a dietitian to make an eating plan that is right for you. °? Choose foods that contain no trans fat and are low in saturated fat and cholesterol. Healthy choices include fresh or frozen fruits and vegetables, fish, lean meats, legumes, fat-free or low-fat dairy products, and whole-grain or high-fiber foods. °? Limit salt (sodium) if  directed by your health care provider. Sodium restriction may reduce symptoms of heart failure. Ask a dietitian to recommend heart-healthy seasonings. °? Use healthy cooking methods instead of frying. Healthy methods include roasting, grilling, broiling, baking, poaching, steaming, and stir-frying. °· Limit your fluid intake if directed by your health care provider. Fluid restriction may reduce symptoms of heart failure. °Lifestyle °· Stop smoking or using chewing tobacco. Nicotine and tobacco can damage your heart and your blood vessels. Do not use nicotine gum or patches before talking to your health care provider. °· Limit alcohol intake to no more than 1 drink per day for non-pregnant women and 2 drinks per day for men. One drink equals 12 oz of beer, 5 oz of wine, or 1½ oz of hard liquor. °? Drinking more than that is harmful to your heart. Tell your health care provider if you drink alcohol several times a week. °? Talk with your health care provider about whether any level of alcohol use is safe for you. °? If your heart has already been damaged by alcohol or you have severe heart failure, drinking alcohol should be stopped completely. °· Stop use of illegal drugs. °· Lose weight if directed by your health care provider. Weight loss may reduce symptoms of heart failure. °· Do moderate physical activity if directed by your health care provider. People who are elderly and people with severe heart failure should consult with a health care provider for physical activity recommendations. °  Monitor important information °· Weigh yourself every day. Keeping track of your weight daily helps you to notice excess fluid sooner. °? Weigh yourself every morning after you urinate and before you eat breakfast. °? Wear the same amount of clothing each time you weigh yourself. °? Record your daily weight. Provide your health care provider with your weight record. °· Monitor and record your blood pressure as told by your health  care provider. °· Check your pulse as told by your health care provider. °Dealing with extreme temperatures °· If the weather is extremely hot: °? Avoid vigorous physical activity. °? Use air conditioning or fans or seek a cooler location. °? Avoid caffeine and alcohol. °? Wear loose-fitting, lightweight, and light-colored clothing. °· If the weather is extremely cold: °? Avoid vigorous physical activity. °? Layer your clothes. °? Wear mittens or gloves, a hat, and a scarf when you go outside. °? Avoid alcohol. °General instructions °· Manage other health conditions such as hypertension, diabetes, thyroid disease, or abnormal heart rhythms as told by your health care provider. °· Learn to manage stress. If you need help to do this, ask your health care provider. °· Plan rest periods when fatigued. °· Get ongoing education and support as needed. °· Participate in or seek rehabilitation as needed to maintain or improve independence and quality of life. °· Stay up to date with immunizations. Keeping current on pneumococcal and influenza immunizations is especially important to prevent respiratory infections. °· Keep all follow-up visits as told by your health care provider. This is important. °Contact a health care provider if: °· You have a rapid weight gain. °· You have increasing shortness of breath that is unusual for you. °· You are unable to participate in your usual physical activities. °· You tire easily. °· You cough more than normal, especially with physical activity. °· You have any swelling or more swelling in areas such as your hands, feet, ankles, or abdomen. °· You are unable to sleep because it is hard to breathe. °· You feel like your heart is beating quickly (palpitations). °· You become dizzy or light-headed when you stand up. °Get help right away if: °· You have difficulty breathing. °· You notice or your family notices a change in your awareness, such as having trouble staying awake or having  difficulty with concentration. °· You have pain or discomfort in your chest. °· You have an episode of fainting (syncope). °This information is not intended to replace advice given to you by your health care provider. Make sure you discuss any questions you have with your health care provider. °Document Released: 04/07/2005 Document Revised: 12/11/2015 Document Reviewed: 10/31/2015 °Elsevier Interactive Patient Education © 2017 [employer] ° °

## 2017-03-25 ENCOUNTER — Other Ambulatory Visit: Payer: Self-pay | Admitting: Family

## 2017-03-25 LAB — BMP8+EGFR
BUN/Creatinine Ratio: 10 — ABNORMAL LOW (ref 12–28)
BUN: 12 mg/dL (ref 8–27)
CO2: 33 mmol/L — ABNORMAL HIGH (ref 20–29)
Calcium: 10.1 mg/dL (ref 8.7–10.3)
Chloride: 97 mmol/L (ref 96–106)
Creatinine, Ser: 1.22 mg/dL — ABNORMAL HIGH (ref 0.57–1.00)
GFR calc Af Amer: 50 mL/min/{1.73_m2} — ABNORMAL LOW (ref >59)
GFR calc non Af Amer: 43 mL/min/{1.73_m2} — ABNORMAL LOW (ref >59)
Glucose: 141 mg/dL — ABNORMAL HIGH (ref 65–99)
Potassium: 4.5 mmol/L (ref 3.5–5.2)
Sodium: 143 mmol/L (ref 134–144)

## 2017-03-25 LAB — CBC WITH DIFFERENTIAL/PLATELET
Basophils Absolute: 0 10*3/uL (ref 0.0–0.2)
Basos: 0 %
EOS (ABSOLUTE): 0.3 10*3/uL (ref 0.0–0.4)
Eos: 3 %
Hematocrit: 38.6 % (ref 34.0–46.6)
Hemoglobin: 12.6 g/dL (ref 11.1–15.9)
Immature Grans (Abs): 0 10*3/uL (ref 0.0–0.1)
Immature Granulocytes: 0 %
Lymphocytes Absolute: 2.7 10*3/uL (ref 0.7–3.1)
Lymphs: 28 %
MCH: 28.8 pg (ref 26.6–33.0)
MCHC: 32.6 g/dL (ref 31.5–35.7)
MCV: 88 fL (ref 79–97)
Monocytes Absolute: 1 10*3/uL — ABNORMAL HIGH (ref 0.1–0.9)
Monocytes: 10 %
Neutrophils Absolute: 5.8 10*3/uL (ref 1.4–7.0)
Neutrophils: 59 %
Platelets: 295 10*3/uL (ref 150–379)
RBC: 4.37 x10E6/uL (ref 3.77–5.28)
RDW: 15.6 % — ABNORMAL HIGH (ref 12.3–15.4)
WBC: 9.9 10*3/uL (ref 3.4–10.8)

## 2017-03-26 ENCOUNTER — Ambulatory Visit (INDEPENDENT_AMBULATORY_CARE_PROVIDER_SITE_OTHER): Payer: Medicare Other | Admitting: Emergency Medicine

## 2017-03-26 ENCOUNTER — Encounter: Payer: Self-pay | Admitting: Emergency Medicine

## 2017-03-26 DIAGNOSIS — J449 Chronic obstructive pulmonary disease, unspecified: Secondary | ICD-10-CM | POA: Insufficient documentation

## 2017-03-26 DIAGNOSIS — J8410 Pulmonary fibrosis, unspecified: Secondary | ICD-10-CM | POA: Diagnosis not present

## 2017-03-26 DIAGNOSIS — J9611 Chronic respiratory failure with hypoxia: Secondary | ICD-10-CM | POA: Insufficient documentation

## 2017-03-26 DIAGNOSIS — J849 Interstitial pulmonary disease, unspecified: Secondary | ICD-10-CM | POA: Diagnosis not present

## 2017-03-26 LAB — PULMONARY FUNCTION TEST
DL/VA % pred: 81 %
DL/VA: 4.22 ml/min/mmHg/L
DLCO unc % pred: 53 %
DLCO unc: 15.01 ml/min/mmHg
FEF 25-75 Pre: 1.14 L/sec
FEF2575-%Pred-Pre: 65 %
FEV1-%Pred-Pre: 63 %
FEV1-Pre: 1.48 L
FEV1FVC-%Pred-Pre: 100 %
FEV6-%Pred-Pre: 65 %
FEV6-Pre: 1.96 L
FEV6FVC-%Pred-Pre: 105 %
FVC-%Pred-Pre: 62 %
FVC-Pre: 1.96 L
Pre FEV1/FVC ratio: 75 %
Pre FEV6/FVC Ratio: 100 %
RV % pred: 71 %
RV: 1.77 L
TLC % pred: 71 %
TLC: 3.93 L

## 2017-03-26 NOTE — Assessment & Plan Note (Signed)
Obstruction confirmed on her pulmonary function testing from 03/26/17.  She has been on Incruse.  She believes that she does benefit.  I will continue this.  She has albuterol nebulizer available to use as needed.

## 2017-03-26 NOTE — Patient Instructions (Signed)
Your CT scan shows a small amount of increased scar at the bottoms of your lungs compared with your prior scan in July 2016.  Do not believe that there is enough change to merit starting occasions for this. Please continue Incruse 1 inhalation every day Keep albuterol nebulizer available to use as needed for shortness of breath Continue prednisone 10 mg daily Continue your oxygen at 3 L/min at all times Follow with Dr Lamonte Sakai in 6 months or sooner if you have any problems

## 2017-03-26 NOTE — Assessment & Plan Note (Signed)
Bibasilar interstitial lung disease with compared with 10/2014.  Not clear to me that the risk-benefit ratio favors treating her with anti-fibrotic therapy which has significant potential side effects.  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

## 2017-03-26 NOTE — Assessment & Plan Note (Signed)
Obesity, hypoventilation, untreated sleep apnea, interstitial lung disease at her bilateral bases, obstructive lung disease, diastolic CHF are all contributing to her chronic hypoxemia.  She needs to stay on 3 L/min at all times.  She would benefit from a sleep study but does not want to have this done.

## 2017-03-26 NOTE — Progress Notes (Signed)
PFT completed today 03/26/17.  

## 2017-03-26 NOTE — Progress Notes (Signed)
Subjective:    Patient ID: Lorraine Leonard, female    DOB: 1941/02/16, 76 y.o.   MRN: [id number]  HPI  01/23/17 -- this is a follow-up visit for patient to her history of a pulmonary nodule in interstitial lung disease seen most prominently in the left lower lobe. She also has A Fib, HTN and diastolic CHF. At one time it was felt she may possibly have rheumatoid arthritis although this is no longer felt to be the case. 11/23/15 she had an echocardiogram that showed normal pulmonary pressures. A repeat echocardiogram 09/24/16 was performed and again showed normal PA pressure with an estimated peak pressure of 25 mmHg. We have followed her interstitial lung disease and pulmonary nodule with serial CT scans. She had an interval scan 09/23/16 which she was seen in the emergency department for dyspnea. This showed no evidence of pulmonary embolus, bilateral lower lobe interstitial prominence slightly more prominent than in 10/2014.    She describes increase in her cough, SOB, weakness. Was admitted in June and July for diuresis. Was noted to be hypoxemic and was started on O2 > wearing 3L/min, sleeps with it. She is under eval for a possible DCCV, but this was postponed due to anemia and a positive FOBT. She snores a little, still fatigued when she wakes up. She has refused eval for OSA in the past.   ROV 03/26/17 --patient follows up today for her history of interstitial lung disease, dyspnea, hypoxemic respiratory failure.  He also has suspected obstructive sleep apnea although the diagnosis has not been established.  We repeated her CT scan of the chest on 03/23/17 which does show progression of interstitial disease in the bases and scattered subpleural lymph nodes.  There is been slight progression of the interstitial disease compared with 11/17/14.  She underwent pulmonary function testing today which I have reviewed.  This is consistent with restrictive lung disease, possible superimposed obstruction based on a normal  FEV1 to FVC ratio.  Her lung volumes are restricted her diffusion capacity is decreased but corrects to the normal range when adjusted for her alveolar volume.  She remains on prednisone 10 mg daily. She failed DCCV for her A fib. She is on Incruse, minimal albuterol use.    No evidence obstruction on spirometry.  PULMONARY FUNCTON TEST 02/03/2012  FVC 2.91  FEV1 2.1  FEV1/FVC 72.2  FVC  % Predicted 89  FEV % Predicted 90  FeF 25-75 1.45  FeF 25-75 % Predicted 2.5        Objective:   Physical Exam Vitals:   03/26/17 1507 03/26/17 1514  BP:  126/84  Pulse:  (!) 110  SpO2:  95%  Weight: 253 lb (114.8 kg)   Height: 5\' 7"  (1.702 m)    Gen: Pleasant, obese woman, in no distress,  normal affect  ENT: No lesions,  mouth clear,  oropharynx clear, no postnasal drip  Neck: No JVD, no TMG, no carotid bruits  Lungs: No use of accessory muscles, few insp crackles L base  Cardiovascular: RRR, heart sounds normal, no murmur or gallops, no peripheral edema  Musculoskeletal: No deformities, no cyanosis or clubbing  Neuro: alert, non focal  Skin: Warm, no lesions or rashes  High-resolution CT chest 03/23/17 --  COMPARISON:  09/23/2016, 11/17/2014, 12/09/2011 and 01/12/2007.  FINDINGS: Cardiovascular: Atherosclerotic calcification of the arterial vasculature, including coronary arteries. Pulmonary arteries and heart are enlarged. No pericardial effusion.  Mediastinum/Nodes: Mediastinal lymph nodes are not enlarged by CT size criteria. Hilar regions  are difficult to evaluate without IV contrast. No axillary adenopathy. Esophagus is grossly unremarkable.  Lungs/Pleura: Subpleural reticulation and traction bronchiectasis/bronchiolectasis appear slightly basilar predominant. Findings are progressive from 11/17/2014. There may be honeycombing at the left lung base. Scattered subpleural lymph nodes at the lung bases are unchanged over multiple prior exams. No air trapping at the  lung bases. No pleural fluid. Airway is unremarkable.  Upper Abdomen: Visualized portions of the liver, gallbladder, adrenal glands, right kidney, spleen, pancreas, stomach and bowel are grossly unremarkable. No upper abdominal adenopathy.  Musculoskeletal: Degenerative changes in the spine.  IMPRESSION: 1. Pulmonary parenchymal pattern of fibrosis appears progressive from 11/17/2014 and is worrisome for usual interstitial pneumonitis. Fibrotic nonspecific interstitial pneumonitis is another consideration. 2. Aortic atherosclerosis (ICD10-170.0). Coronary artery calcification. 3. Enlarged pulmonary arteries, indicative of pulmonary arterial hypertension     Assessment & Plan:  Chronic respiratory failure with hypoxia (HCC) Obesity, hypoventilation, untreated sleep apnea, interstitial lung disease at her bilateral bases, obstructive lung disease, diastolic CHF are all contributing to her chronic hypoxemia.  She needs to stay on 3 L/min at all times.  She would benefit from a sleep study but does not want to have this done.  Pulmonary fibrosis ([place]) Bibasilar interstitial lung disease with compared with 10/2014.  Not clear to me that the risk-benefit ratio favors treating her with anti-fibrotic therapy which has significant potential side effects.  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   COPD (chronic obstructive pulmonary disease) (HCC) Obstruction confirmed on her pulmonary function testing from 03/26/17.  She has been on Incruse.  She believes that she does benefit.  I will continue this.  She has albuterol nebulizer available to use as needed.  Baltazar Apo, MD, PhD 03/26/2017, 3:36 PM [facility] [phone] or if no answer [phone]

## 2017-03-27 ENCOUNTER — Other Ambulatory Visit: Payer: Self-pay | Admitting: Family

## 2017-03-27 NOTE — Telephone Encounter (Signed)
Last filled 02/17/15 by Thereasa Solo

## 2017-04-02 ENCOUNTER — Encounter: Payer: Self-pay | Admitting: Student

## 2017-04-02 ENCOUNTER — Telehealth: Payer: Self-pay

## 2017-04-02 DIAGNOSIS — E1142 Type 2 diabetes mellitus with diabetic polyneuropathy: Secondary | ICD-10-CM | POA: Diagnosis not present

## 2017-04-02 DIAGNOSIS — J8410 Pulmonary fibrosis, unspecified: Secondary | ICD-10-CM | POA: Diagnosis not present

## 2017-04-02 DIAGNOSIS — I5031 Acute diastolic (congestive) heart failure: Secondary | ICD-10-CM | POA: Diagnosis not present

## 2017-04-02 DIAGNOSIS — J9611 Chronic respiratory failure with hypoxia: Secondary | ICD-10-CM | POA: Diagnosis not present

## 2017-04-02 DIAGNOSIS — I130 Hypertensive heart and chronic kidney disease with heart failure and stage 1 through stage 4 chronic kidney disease, or unspecified chronic kidney disease: Secondary | ICD-10-CM | POA: Diagnosis not present

## 2017-04-02 DIAGNOSIS — I4891 Unspecified atrial fibrillation: Secondary | ICD-10-CM | POA: Diagnosis not present

## 2017-04-02 NOTE — Telephone Encounter (Signed)
Increase lasix to 60mg  daily and update [place] again on Monday.    Zandra Abts MD

## 2017-04-02 NOTE — Telephone Encounter (Signed)
Nurse notified  

## 2017-04-02 NOTE — Telephone Encounter (Signed)
Home health nurse contacted office stating patient has had a 7 pound weight gain in 10 days. Patient is taking lasix 40 mg alternating with 60. Nurse states right leg looks about the same, but left leg is more swollen. Lungs clear.

## 2017-04-06 ENCOUNTER — Encounter: Payer: Self-pay | Admitting: Internal Medicine

## 2017-04-06 ENCOUNTER — Encounter: Payer: Self-pay | Admitting: *Deleted

## 2017-04-06 ENCOUNTER — Other Ambulatory Visit (HOSPITAL_COMMUNITY)
Admission: RE | Admit: 2017-04-06 | Discharge: 2017-04-06 | Disposition: A | Discharge status: Home / Self Care | Payer: Medicare Other | Source: Ambulatory Visit | Attending: Physician Assistant | Admitting: Physician Assistant

## 2017-04-06 ENCOUNTER — Other Ambulatory Visit (HOSPITAL_COMMUNITY)
Admission: RE | Admit: 2017-04-06 | Discharge: 2017-04-06 | Disposition: A | Discharge status: Home / Self Care | Payer: Medicare Other | Source: Ambulatory Visit | Attending: Internal Medicine | Admitting: Internal Medicine

## 2017-04-06 ENCOUNTER — Telehealth: Payer: Self-pay

## 2017-04-06 ENCOUNTER — Ambulatory Visit (INDEPENDENT_AMBULATORY_CARE_PROVIDER_SITE_OTHER): Payer: Medicare Other | Admitting: Internal Medicine

## 2017-04-06 VITALS — BP 122/76 | HR 102 | Ht 67.0 in | Wt 257.0 lb

## 2017-04-06 DIAGNOSIS — Z01818 Encounter for other preprocedural examination: Secondary | ICD-10-CM | POA: Insufficient documentation

## 2017-04-06 DIAGNOSIS — D508 Other iron deficiency anemias: Secondary | ICD-10-CM | POA: Insufficient documentation

## 2017-04-06 DIAGNOSIS — D500 Iron deficiency anemia secondary to blood loss (chronic): Secondary | ICD-10-CM | POA: Insufficient documentation

## 2017-04-06 DIAGNOSIS — I4891 Unspecified atrial fibrillation: Secondary | ICD-10-CM | POA: Diagnosis not present

## 2017-04-06 LAB — IRON AND TIBC
Iron: 87 ug/dL (ref 28–170)
Saturation Ratios: 20 % (ref 10.4–31.8)
TIBC: 444 ug/dL (ref 250–450)
UIBC: 357 ug/dL

## 2017-04-06 LAB — CBC WITH DIFFERENTIAL/PLATELET
Basophils Absolute: 0 10*3/uL (ref 0.0–0.1)
Basophils Relative: 0 %
Eosinophils Absolute: 0.3 10*3/uL (ref 0.0–0.7)
Eosinophils Relative: 2 %
HCT: 41.1 % (ref 36.0–46.0)
Hemoglobin: 12.9 g/dL (ref 12.0–15.0)
Lymphocytes Relative: 25 %
Lymphs Abs: 3.1 10*3/uL (ref 0.7–4.0)
MCH: 29.3 pg (ref 26.0–34.0)
MCHC: 31.4 g/dL (ref 30.0–36.0)
MCV: 93.2 fL (ref 78.0–100.0)
Monocytes Absolute: 1 10*3/uL (ref 0.1–1.0)
Monocytes Relative: 8 %
Neutro Abs: 8.1 10*3/uL — ABNORMAL HIGH (ref 1.7–7.7)
Neutrophils Relative %: 65 %
Platelets: 281 10*3/uL (ref 150–400)
RBC: 4.41 MIL/uL (ref 3.87–5.11)
RDW: 15.2 % (ref 11.5–15.5)
WBC: 12.5 10*3/uL — ABNORMAL HIGH (ref 4.0–10.5)

## 2017-04-06 LAB — BASIC METABOLIC PANEL
Anion gap: 8 (ref 5–15)
BUN: 21 mg/dL — ABNORMAL HIGH (ref 6–20)
CO2: 32 mmol/L (ref 22–32)
Calcium: 10 mg/dL (ref 8.9–10.3)
Chloride: 99 mmol/L — ABNORMAL LOW (ref 101–111)
Creatinine, Ser: 1.12 mg/dL — ABNORMAL HIGH (ref 0.44–1.00)
GFR calc Af Amer: 54 mL/min — ABNORMAL LOW (ref >60)
GFR calc non Af Amer: 46 mL/min — ABNORMAL LOW (ref >60)
Glucose, Bld: 176 mg/dL — ABNORMAL HIGH (ref 65–99)
Potassium: 4 mmol/L (ref 3.5–5.1)
Sodium: 139 mmol/L (ref 135–145)

## 2017-04-06 LAB — PROTIME-INR
INR: 1.4
Prothrombin Time: 17 seconds — ABNORMAL HIGH (ref 11.4–15.2)

## 2017-04-06 LAB — FERRITIN: Ferritin: 45 ng/mL (ref 11–307)

## 2017-04-06 NOTE — Addendum Note (Signed)
Addended by: Evans Lance on: 04/06/2017 12:56 PM   Modules accepted: Level of Service

## 2017-04-06 NOTE — Progress Notes (Addendum)
HPI Lorraine Leonard returns today for ongoing evaluation and management of her persistent atrial fibrillation.  She is a 76 year old woman with a history of pulmonary fibrosis and COPD who uses home oxygen.  She developed atrial fibrillation several months ago.  She underwent cardioversion but had early return of atrial fibrillation.  She has been started on flecainide 75 mg twice a day but continues to have persistent A. fib.  She returns today for additional evaluation.  She has palpitations.  She denies dietary indiscretion and denies missing any of her medications. Allergies  Allergen Reactions  . Allopurinol Swelling    Feet and legs swell  . Mobic [Meloxicam] Swelling and Other (See Comments)    Feet and Leg swelling     Current Outpatient Medications  Medication Sig Dispense Refill  . albuterol (PROVENTIL) (2.5 MG/3ML) 0.083% nebulizer solution Take 3 mLs (2.5 mg total) by nebulization every 6 (six) hours as needed for wheezing or shortness of breath. J96.01 150 mL 1  . azelastine (ASTELIN) 0.1 % nasal spray PLACE 2 SPRAYS INTO BOTH NOSTRILS 2 (TWO) TIMES DAILY AS NEEDED. 30 mL 5  . calcium citrate (CALCITRATE - DOSED IN MG ELEMENTAL CALCIUM) 950 MG tablet Take 1 tablet by mouth 2 (two) times daily.     . cefdinir (OMNICEF) 300 MG capsule Take 1 capsule (300 mg total) 2 (two) times daily by mouth. 1 po BID 20 capsule 0  . Cholecalciferol 2000 units CAPS Take 2,000 Units by mouth daily.     Marland Kitchen conjugated estrogens (PREMARIN) vaginal cream Place 0.5 g vaginally every Thursday. Use as directed    . cyanocobalamin 500 MCG tablet Take 500 mcg by mouth daily.     Marland Kitchen DEXILANT 60 MG capsule Take 60 mg by mouth daily.  5  . diazepam (VALIUM) 5 MG tablet TAKE 1 TABLET TWICE A DAY AS NEEDED FOR ANXIETY 60 tablet 1  . diltiazem (CARDIZEM CD) 180 MG 24 hr capsule TAKE 1 CAPSULE BY MOUTH TWICE A DAY 60 capsule 0  . docusate sodium (COLACE) 100 MG capsule Take 200 mg by mouth daily as needed for  mild constipation.    Marland Kitchen ELIQUIS 5 MG TABS tablet TAKE 1 TABLET BY MOUTH TWICE A DAY 60 tablet 5  . EPINEPHrine 0.3 mg/0.3 mL IJ SOAJ injection Inject 0.3 mg into the muscle once as needed (For anaphylaxis.).    Marland Kitchen FeFum-FePoly-FA-B Cmp-C-Biot (FOLIVANE-PLUS) CAPS Take 1 capsule by mouth 2 (two) times daily.  1  . ferrous sulfate 325 (65 FE) MG tablet Take 325 mg by mouth 2 (two) times daily with a meal.    . flecainide (TAMBOCOR) 150 MG tablet Take 0.5 tablets (75 mg total) by mouth 2 (two) times daily. 30 tablet 11  . fluticasone (FLONASE) 50 MCG/ACT nasal spray PLACE 1 SPRAY INTO BOTH NOSTRILS 2 (TWO) TIMES DAILY AS NEEDED FOR ALLERGIES OR RHINITIS. 16 g 2  . furosemide (LASIX) 40 MG tablet Take 40 mg ( 1 Tablet) Alternating with 60 mg ( 1 1/2 Tablet) Daily 90 tablet 6  . INCRUSE ELLIPTA 62.5 MCG/INH AEPB TAKE 1 PUFF BY MOUTH EVERY DAY  3  . levocetirizine (XYZAL) 5 MG tablet Take 5 mg by mouth every evening.     Marland Kitchen levothyroxine (SYNTHROID, LEVOTHROID) 100 MCG tablet TAKE 1 TABLET (100 MCG TOTAL) BY MOUTH DAILY BEFORE BREAKFAST. 90 tablet 1  . metoprolol succinate (TOPROL-XL) 50 MG 24 hr tablet Take 1 tablet (50 mg total) by mouth  daily. Take with or immediately following a meal. 90 tablet 3  . mometasone (ELOCON) 0.1 % cream APPLY TO AFFECTED AREA DAILY 45 g 2  . Multiple Vitamins-Minerals (PRESERVISION AREDS 2 PO) Take 1 tablet by mouth 2 (two) times daily.    . ondansetron (ZOFRAN) 8 MG tablet Take 8 mg by mouth every 8 (eight) hours as needed for nausea or vomiting.    Marland Kitchen OVER THE COUNTER MEDICATION Place 1 drop into both eyes as needed (For dry eyes.). Systane Gel Drops    . PATADAY 0.2 % SOLN Place 1 drop into both eyes daily.    . polyethylene glycol powder (GLYCOLAX/MIRALAX) powder TAKE 17 G BY MOUTH 2 (TWO) TIMES DAILY AS NEEDED. 3162 g 0  . predniSONE (DELTASONE) 10 MG tablet Take 10 mg by mouth daily with breakfast.    . rosuvastatin (CRESTOR) 10 MG tablet TAKE 1 TABLET BY MOUTH AT  BEDTIME 90 tablet 0  . spironolactone (ALDACTONE) 25 MG tablet TAKE 1 TABLET BY MOUTH EVERY DAY 90 tablet 0  . traMADol (ULTRAM) 50 MG tablet TAKE 1 TABLET BY MOUTH EVERY 6 HOURS AS NEEDED (Patient taking differently: TAKE 1 TABLET BY MOUTH EVERY 6 HOURS AS NEEDED FOR PAIN) 90 tablet 2  . VIGAMOX 0.5 % ophthalmic solution Apply 1 drop to eye as needed (after eye injection).   12  . vitamin C (ASCORBIC ACID) 500 MG tablet Take 500 mg by mouth daily.    . Vitamin D, Ergocalciferol, (DRISDOL) 50000 units CAPS capsule TAKE 1 CAPSULE (50,000 UNITS TOTAL) BY MOUTH EVERY 7 (SEVEN) DAYS. 12 capsule 0  . vitamin E 400 UNIT capsule Take 400 Units by mouth daily.     No current facility-administered medications for this visit.      Past Medical History:  Diagnosis Date  . Acute diastolic CHF (congestive heart failure) ([place]) 11/11/2016  . Allergic rhinitis   . Anxiety   . Anxiety   . Arthritis   . Atrial fibrillation ([place]) 09/2016  . COPD (chronic obstructive pulmonary disease) ([place])   . Depression   . Diabetes mellitus    pre-diabetic  . Diverticulosis   . Dyspnea   . Dysrhythmia   . GERD (gastroesophageal reflux disease)   . Gout   . Hyperlipidemia   . Hypertension   . Hypothyroidism   . Lung fibrosis ([place])   . Macular degeneration   . Neuropathy   . Pulmonary fibrosis ([place])   . Rheumatoid arthritis(714.0)   . Sleep apnea    uses 3 liters of o2 24/7  . Urticaria     ROS:   All systems reviewed and negative except as noted in the HPI.   Past Surgical History:  Procedure Laterality Date  . CARDIOVERSION N/A 01/30/2017   Procedure: CARDIOVERSION;  Surgeon: Josue Hector, MD;  [facility];  Service: Cardiovascular;  Laterality: N/A;  . CATARACT EXTRACTION, BILATERAL Bilateral   . EUS N/A 10/03/2015   Procedure: UPPER ENDOSCOPIC ULTRASOUND (EUS) RADIAL;  Surgeon: Arta Silence, MD;  Location: [facility];  Service: Endoscopy;  Laterality: N/A;  . FINE NEEDLE  ASPIRATION N/A 10/03/2015   Procedure: FINE NEEDLE ASPIRATION (FNA) RADIAL;  Surgeon: Arta Silence, MD;  Location: [facility];  Service: Endoscopy;  Laterality: N/A;  . 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 LUMBAR DISC SURGERY     x 2  . TUBAL LIGATION    . VAGINAL HYSTERECTOMY  Family History  Problem Relation Age of Onset  . Diabetes Unknown   . Hypertension Unknown   . Coronary artery disease Unknown   . Stroke Unknown   . Asthma Unknown   . Kidney disease Sister   . Lung cancer Sister   . Lung cancer Sister   . Suicidality Son        committed suicide in 2009  . Hyperlipidemia Unknown   . Anxiety disorder Unknown   . Migraines Unknown      Social History   Socioeconomic History  . Marital status: Married    Spouse name: Not on file  . Number of children: 4  . Years of education: Not on file  . Highest education level: Not on file  Social Needs  . Financial resource strain: Not on file  . Food insecurity - worry: Not on file  . Food insecurity - inability: Not on file  . Transportation needs - medical: Not on file  . Transportation needs - non-medical: Not on file  Occupational History  . Occupation: retired    Comment: cna  Tobacco Use  . Smoking status: Never Smoker  . Smokeless tobacco: Never Used  Substance and Sexual Activity  . Alcohol use: No  . Drug use: No  . Sexual activity: Not Currently    Birth control/protection: Surgical  Other Topics Concern  . 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     BP 122/76   Pulse (!) 102   Ht 5\' 7"  (1.702 m)   Wt 257 lb (116.6 kg)   SpO2 94%   BMI 40.25 kg/m   Physical Exam:   chronically ill appearing 76 year old woman, NAD HEENT: Unremarkable Neck: 7 cm JVD, no thyromegally Lymphatics:  No adenopathy Back:  No CVA tenderness Lungs:  Clear, with decreased breath sounds bilaterally HEART:  IRegular rate  rhythm, no murmurs, no rubs, no clicks Abd:  soft, positive bowel sounds, no organomegally, no rebound, no guarding Ext:  2 plus pulses, no edema, no cyanosis, no clubbing Skin:  No rashes no nodules Neuro:  CN II through XII intact, motor grossly intact  EKG -  Atrial fibrillation with a controlled ventricular response  DEVICE  Normal device function.  See PaceArt for details.   Assess/Plan: 1.  Persistent atrial fibrillation -we discussed the treatment options in detail.  Proceeding with repeat DC cardioversion is indicated.  We also discussed AV node ablation and insertion of a pacemaker for rate control.  We will plan to proceed with cardioversion.  We also discussed initiation of dofetilide if flecainide is ineffective at maintaining her in sinus rhythm. 2.  Chronic diastolic heart failure -her dyspnea is multifactorial.  She is encouraged to maintain a low-sodium diet.  She will continue her current medications. 3.  Pulmonary fibrosis -she is followed in the pulmonary clinic.  She is on home oxygen therapy.  She is not a candidate for amiodarone secondary to her pulmonary fibrosis. 4.  Anemia -she has a history of iron deficiency.  Her last hemoglobin was good.  We will check a ferritin level.  Crissie Sickles, MD

## 2017-04-06 NOTE — Telephone Encounter (Signed)
Nurse contacted office today stating patient is up another 8 pounds since thursday

## 2017-04-06 NOTE — H&P (View-Only) (Signed)
HPI [name] returns today for ongoing evaluation and management of her persistent atrial fibrillation.  She is a 76 year old woman with a history of pulmonary fibrosis and COPD who uses home oxygen.  She developed atrial fibrillation several months ago.  She underwent cardioversion but had early return of atrial fibrillation.  She has been started on flecainide 75 mg twice a day but continues to have persistent A. fib.  She returns today for additional evaluation.  She has palpitations.  She denies dietary indiscretion and denies missing any of her medications. Allergies  Allergen Reactions  . Allopurinol Swelling    Feet and legs swell  . Mobic [Meloxicam] Swelling and Other (See Comments)    Feet and Leg swelling     Current Outpatient Medications  Medication Sig Dispense Refill  . albuterol (PROVENTIL) (2.5 MG/3ML) 0.083% nebulizer solution Take 3 mLs (2.5 mg total) by nebulization every 6 (six) hours as needed for wheezing or shortness of breath. J96.01 150 mL 1  . azelastine (ASTELIN) 0.1 % nasal spray PLACE 2 SPRAYS INTO BOTH NOSTRILS 2 (TWO) TIMES DAILY AS NEEDED. 30 mL 5  . calcium citrate (CALCITRATE - DOSED IN MG ELEMENTAL CALCIUM) 950 MG tablet Take 1 tablet by mouth 2 (two) times daily.     . cefdinir (OMNICEF) 300 MG capsule Take 1 capsule (300 mg total) 2 (two) times daily by mouth. 1 po BID 20 capsule 0  . Cholecalciferol 2000 units CAPS Take 2,000 Units by mouth daily.     Marland Kitchen conjugated estrogens (PREMARIN) vaginal cream Place 0.5 g vaginally every Thursday. Use as directed    . cyanocobalamin 500 MCG tablet Take 500 mcg by mouth daily.     Marland Kitchen DEXILANT 60 MG capsule Take 60 mg by mouth daily.  5  . diazepam (VALIUM) 5 MG tablet TAKE 1 TABLET TWICE A DAY AS NEEDED FOR ANXIETY 60 tablet 1  . diltiazem (CARDIZEM CD) 180 MG 24 hr capsule TAKE 1 CAPSULE BY MOUTH TWICE A DAY 60 capsule 0  . docusate sodium (COLACE) 100 MG capsule Take 200 mg by mouth daily as needed for  mild constipation.    Marland Kitchen ELIQUIS 5 MG TABS tablet TAKE 1 TABLET BY MOUTH TWICE A DAY 60 tablet 5  . EPINEPHrine 0.3 mg/0.3 mL IJ SOAJ injection Inject 0.3 mg into the muscle once as needed (For anaphylaxis.).    Marland Kitchen FeFum-FePoly-FA-B Cmp-C-Biot (FOLIVANE-PLUS) CAPS Take 1 capsule by mouth 2 (two) times daily.  1  . ferrous sulfate 325 (65 FE) MG tablet Take 325 mg by mouth 2 (two) times daily with a meal.    . flecainide (TAMBOCOR) 150 MG tablet Take 0.5 tablets (75 mg total) by mouth 2 (two) times daily. 30 tablet 11  . fluticasone (FLONASE) 50 MCG/ACT nasal spray PLACE 1 SPRAY INTO BOTH NOSTRILS 2 (TWO) TIMES DAILY AS NEEDED FOR ALLERGIES OR RHINITIS. 16 g 2  . furosemide (LASIX) 40 MG tablet Take 40 mg ( 1 Tablet) Alternating with 60 mg ( 1 1/2 Tablet) Daily 90 tablet 6  . INCRUSE ELLIPTA 62.5 MCG/INH AEPB TAKE 1 PUFF BY MOUTH EVERY DAY  3  . levocetirizine (XYZAL) 5 MG tablet Take 5 mg by mouth every evening.     Marland Kitchen levothyroxine (SYNTHROID, LEVOTHROID) 100 MCG tablet TAKE 1 TABLET (100 MCG TOTAL) BY MOUTH DAILY BEFORE BREAKFAST. 90 tablet 1  . metoprolol succinate (TOPROL-XL) 50 MG 24 hr tablet Take 1 tablet (50 mg total) by mouth  daily. Take with or immediately following a meal. 90 tablet 3  . mometasone (ELOCON) 0.1 % cream APPLY TO AFFECTED AREA DAILY 45 g 2  . Multiple Vitamins-Minerals (PRESERVISION AREDS 2 PO) Take 1 tablet by mouth 2 (two) times daily.    . ondansetron (ZOFRAN) 8 MG tablet Take 8 mg by mouth every 8 (eight) hours as needed for nausea or vomiting.    Marland Kitchen OVER THE COUNTER MEDICATION Place 1 drop into both eyes as needed (For dry eyes.). Systane Gel Drops    . PATADAY 0.2 % SOLN Place 1 drop into both eyes daily.    . polyethylene glycol powder (GLYCOLAX/MIRALAX) powder TAKE 17 G BY MOUTH 2 (TWO) TIMES DAILY AS NEEDED. 3162 g 0  . predniSONE (DELTASONE) 10 MG tablet Take 10 mg by mouth daily with breakfast.    . rosuvastatin (CRESTOR) 10 MG tablet TAKE 1 TABLET BY MOUTH AT  BEDTIME 90 tablet 0  . spironolactone (ALDACTONE) 25 MG tablet TAKE 1 TABLET BY MOUTH EVERY DAY 90 tablet 0  . traMADol (ULTRAM) 50 MG tablet TAKE 1 TABLET BY MOUTH EVERY 6 HOURS AS NEEDED (Patient taking differently: TAKE 1 TABLET BY MOUTH EVERY 6 HOURS AS NEEDED FOR PAIN) 90 tablet 2  . VIGAMOX 0.5 % ophthalmic solution Apply 1 drop to eye as needed (after eye injection).   12  . vitamin C (ASCORBIC ACID) 500 MG tablet Take 500 mg by mouth daily.    . Vitamin D, Ergocalciferol, (DRISDOL) 50000 units CAPS capsule TAKE 1 CAPSULE (50,000 UNITS TOTAL) BY MOUTH EVERY 7 (SEVEN) DAYS. 12 capsule 0  . vitamin E 400 UNIT capsule Take 400 Units by mouth daily.     No current facility-administered medications for this visit.      Past Medical History:  Diagnosis Date  . Acute diastolic CHF (congestive heart failure) ([place]) 11/11/2016  . Allergic rhinitis   . Anxiety   . Anxiety   . Arthritis   . Atrial fibrillation ([place]) 09/2016  . COPD (chronic obstructive pulmonary disease) ([place])   . Depression   . Diabetes mellitus    pre-diabetic  . Diverticulosis   . Dyspnea   . Dysrhythmia   . GERD (gastroesophageal reflux disease)   . Gout   . Hyperlipidemia   . Hypertension   . Hypothyroidism   . Lung fibrosis ([place])   . Macular degeneration   . Neuropathy   . Pulmonary fibrosis ([place])   . Rheumatoid arthritis(714.0)   . Sleep apnea    uses 3 liters of o2 24/7  . Urticaria     ROS:   All systems reviewed and negative except as noted in the HPI.   Past Surgical History:  Procedure Laterality Date  . CARDIOVERSION N/A 01/30/2017   Procedure: CARDIOVERSION;  Surgeon: Josue Hector, MD;  [facility];  Service: Cardiovascular;  Laterality: N/A;  . CATARACT EXTRACTION, BILATERAL Bilateral   . EUS N/A 10/03/2015   Procedure: UPPER ENDOSCOPIC ULTRASOUND (EUS) RADIAL;  Surgeon: Arta Silence, MD;  [facility];  Service: Endoscopy;  Laterality: N/A;  . FINE NEEDLE  ASPIRATION N/A 10/03/2015   Procedure: FINE NEEDLE ASPIRATION (FNA) RADIAL;  Surgeon: Arta Silence, MD;  [facility];  Service: Endoscopy;  Laterality: N/A;  . 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 LUMBAR DISC SURGERY     x 2  . TUBAL LIGATION    . VAGINAL HYSTERECTOMY  Family History  Problem Relation Age of Onset  . Diabetes Unknown   . Hypertension Unknown   . Coronary artery disease Unknown   . Stroke Unknown   . Asthma Unknown   . Kidney disease Sister   . Lung cancer Sister   . Lung cancer Sister   . Suicidality Son        committed suicide in 2009  . Hyperlipidemia Unknown   . Anxiety disorder Unknown   . Migraines Unknown      Social History   Socioeconomic History  . Marital status: Married    Spouse name: Not on file  . Number of children: 4  . Years of education: Not on file  . Highest education level: Not on file  Social Needs  . Financial resource strain: Not on file  . Food insecurity - worry: Not on file  . Food insecurity - inability: Not on file  . Transportation needs - medical: Not on file  . Transportation needs - non-medical: Not on file  Occupational History  . Occupation: retired    Comment: cna  Tobacco Use  . Smoking status: Never Smoker  . Smokeless tobacco: Never Used  Substance and Sexual Activity  . Alcohol use: No  . Drug use: No  . Sexual activity: Not Currently    Birth control/protection: Surgical  Other Topics Concern  . 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     BP 122/76   Pulse (!) 102   Ht 5\' 7"  (1.702 m)   Wt 257 lb (116.6 kg)   SpO2 94%   BMI 40.25 kg/m   Physical Exam:   chronically ill appearing 76 year old woman, NAD HEENT: Unremarkable Neck: 7 cm JVD, no thyromegally Lymphatics:  No adenopathy Back:  No CVA tenderness Lungs:  Clear, with decreased breath sounds bilaterally HEART:  IRegular rate  rhythm, no murmurs, no rubs, no clicks Abd:  soft, positive bowel sounds, no organomegally, no rebound, no guarding Ext:  2 plus pulses, no edema, no cyanosis, no clubbing Skin:  No rashes no nodules Neuro:  CN II through XII intact, motor grossly intact  EKG -  Atrial fibrillation with a controlled ventricular response  DEVICE  Normal device function.  See PaceArt for details.   Assess/Plan: 1.  Persistent atrial fibrillation -we discussed the treatment options in detail.  Proceeding with repeat DC cardioversion is indicated.  We also discussed AV node ablation and insertion of a pacemaker for rate control.  We will plan to proceed with cardioversion.  We also discussed initiation of dofetilide if flecainide is ineffective at maintaining her in sinus rhythm. 2.  Chronic diastolic heart failure -her dyspnea is multifactorial.  She is encouraged to maintain a low-sodium diet.  She will continue her current medications. 3.  Pulmonary fibrosis -she is followed in the pulmonary clinic.  She is on home oxygen therapy.  She is not a candidate for amiodarone secondary to her pulmonary fibrosis. 4.  Anemia -she has a history of iron deficiency.  Her last hemoglobin was good.  We will check a ferritin level.  Crissie Sickles, MD

## 2017-04-06 NOTE — Patient Instructions (Signed)
Medication Instructions:  Your physician recommends that you continue on your current medications as directed. Please refer to the Current Medication list given to you today.   Labwork: Your physician recommends that you return for lab work in: Today    Testing/Procedures: Your physician has recommended that you have a Cardioversion (DCCV). Electrical Cardioversion uses a jolt of electricity to your heart either through paddles or wired patches attached to your chest. This is a controlled, usually prescheduled, procedure. Defibrillation is done under light anesthesia in the hospital, and you usually go home the day of the procedure. This is done to get your heart back into a normal rhythm. You are not awake for the procedure. Please see the instruction sheet given to you today.    Follow-Up: Your physician recommends that you schedule a follow-up appointment in: After Cardioversion    Any Other Special Instructions Will Be Listed Below (If Applicable).     If you need a refill on your cardiac medications before your next appointment, please call your pharmacy.  Thank you for choosing [facility]!

## 2017-04-07 ENCOUNTER — Encounter (HOSPITAL_COMMUNITY)
Admission: RE | Admit: 2017-04-07 | Discharge: 2017-04-07 | Disposition: A | Discharge status: Home / Self Care | Payer: Medicare Other | Source: Ambulatory Visit | Attending: Cardiology | Admitting: Cardiology

## 2017-04-07 DIAGNOSIS — J9611 Chronic respiratory failure with hypoxia: Secondary | ICD-10-CM | POA: Diagnosis not present

## 2017-04-07 DIAGNOSIS — I130 Hypertensive heart and chronic kidney disease with heart failure and stage 1 through stage 4 chronic kidney disease, or unspecified chronic kidney disease: Secondary | ICD-10-CM | POA: Diagnosis not present

## 2017-04-07 DIAGNOSIS — E1142 Type 2 diabetes mellitus with diabetic polyneuropathy: Secondary | ICD-10-CM | POA: Diagnosis not present

## 2017-04-07 DIAGNOSIS — I4891 Unspecified atrial fibrillation: Secondary | ICD-10-CM | POA: Diagnosis not present

## 2017-04-07 DIAGNOSIS — J8410 Pulmonary fibrosis, unspecified: Secondary | ICD-10-CM | POA: Diagnosis not present

## 2017-04-07 DIAGNOSIS — I5031 Acute diastolic (congestive) heart failure: Secondary | ICD-10-CM | POA: Diagnosis not present

## 2017-04-07 NOTE — Telephone Encounter (Signed)
[facility] with [facility] wanting to continue another 60 days of home care - pt weight today is 250 lbs

## 2017-04-07 NOTE — Telephone Encounter (Signed)
Estill Bamberg made aware - will update [place] again on Friday - also will made Angie aware of home health extension

## 2017-04-07 NOTE — Telephone Encounter (Signed)
Increase lasix to 60mg  bid and update [place] again on Friday. Please extend her home health   Zandra Abts MD

## 2017-04-08 ENCOUNTER — Encounter (HOSPITAL_COMMUNITY): Admission: RE | Admit: 2017-04-08 | Payer: Medicare Other | Source: Ambulatory Visit

## 2017-04-09 ENCOUNTER — Encounter (HOSPITAL_COMMUNITY): Payer: Self-pay

## 2017-04-09 ENCOUNTER — Ambulatory Visit (HOSPITAL_COMMUNITY): Payer: Medicare Other | Admitting: Anesthesiology

## 2017-04-09 ENCOUNTER — Encounter (HOSPITAL_COMMUNITY)
Admission: RE | Disposition: A | Discharge status: Home / Self Care | Payer: Self-pay | Source: Ambulatory Visit | Attending: Cardiology

## 2017-04-09 ENCOUNTER — Ambulatory Visit (HOSPITAL_COMMUNITY)
Admission: RE | Admit: 2017-04-09 | Discharge: 2017-04-09 | Disposition: A | Discharge status: Home / Self Care | Payer: Medicare Other | Source: Ambulatory Visit | Attending: Cardiology | Admitting: Cardiology

## 2017-04-09 DIAGNOSIS — D649 Anemia, unspecified: Secondary | ICD-10-CM | POA: Diagnosis not present

## 2017-04-09 DIAGNOSIS — J449 Chronic obstructive pulmonary disease, unspecified: Secondary | ICD-10-CM | POA: Diagnosis not present

## 2017-04-09 DIAGNOSIS — I481 Persistent atrial fibrillation: Secondary | ICD-10-CM | POA: Diagnosis not present

## 2017-04-09 DIAGNOSIS — Z79899 Other long term (current) drug therapy: Secondary | ICD-10-CM | POA: Diagnosis not present

## 2017-04-09 DIAGNOSIS — I110 Hypertensive heart disease with heart failure: Secondary | ICD-10-CM | POA: Insufficient documentation

## 2017-04-09 DIAGNOSIS — J8410 Pulmonary fibrosis, unspecified: Secondary | ICD-10-CM | POA: Diagnosis not present

## 2017-04-09 DIAGNOSIS — Z9981 Dependence on supplemental oxygen: Secondary | ICD-10-CM | POA: Diagnosis not present

## 2017-04-09 DIAGNOSIS — G4730 Sleep apnea, unspecified: Secondary | ICD-10-CM | POA: Insufficient documentation

## 2017-04-09 DIAGNOSIS — I5032 Chronic diastolic (congestive) heart failure: Secondary | ICD-10-CM | POA: Insufficient documentation

## 2017-04-09 DIAGNOSIS — E039 Hypothyroidism, unspecified: Secondary | ICD-10-CM | POA: Diagnosis not present

## 2017-04-09 DIAGNOSIS — Z79890 Hormone replacement therapy (postmenopausal): Secondary | ICD-10-CM | POA: Insufficient documentation

## 2017-04-09 DIAGNOSIS — F419 Anxiety disorder, unspecified: Secondary | ICD-10-CM | POA: Diagnosis not present

## 2017-04-09 DIAGNOSIS — R001 Bradycardia, unspecified: Secondary | ICD-10-CM | POA: Insufficient documentation

## 2017-04-09 DIAGNOSIS — J309 Allergic rhinitis, unspecified: Secondary | ICD-10-CM | POA: Diagnosis not present

## 2017-04-09 DIAGNOSIS — Z7952 Long term (current) use of systemic steroids: Secondary | ICD-10-CM | POA: Insufficient documentation

## 2017-04-09 DIAGNOSIS — Z7901 Long term (current) use of anticoagulants: Secondary | ICD-10-CM | POA: Diagnosis not present

## 2017-04-09 DIAGNOSIS — E785 Hyperlipidemia, unspecified: Secondary | ICD-10-CM | POA: Insufficient documentation

## 2017-04-09 DIAGNOSIS — E1140 Type 2 diabetes mellitus with diabetic neuropathy, unspecified: Secondary | ICD-10-CM | POA: Diagnosis not present

## 2017-04-09 DIAGNOSIS — I4891 Unspecified atrial fibrillation: Secondary | ICD-10-CM | POA: Diagnosis not present

## 2017-04-09 HISTORY — PX: CARDIOVERSION: SHX1299

## 2017-04-09 LAB — GLUCOSE, CAPILLARY: Glucose-Capillary: 175 mg/dL — ABNORMAL HIGH (ref 65–99)

## 2017-04-09 SURGERY — CARDIOVERSION
Anesthesia: Monitor Anesthesia Care

## 2017-04-09 MED ORDER — LACTATED RINGERS IV SOLN: INTRAVENOUS | Status: DC

## 2017-04-09 MED ORDER — ATROPINE SULFATE 0.4 MG/ML IJ SOLN
INTRAMUSCULAR | Status: AC
  Filled 2017-04-09: qty 2

## 2017-04-09 MED ORDER — ATROPINE SULFATE 0.4 MG/ML IJ SOLN
INTRAMUSCULAR | Status: DC | PRN
  Administered 2017-04-09 (×2): 0.4 mg via INTRAVENOUS

## 2017-04-09 MED ORDER — PROPOFOL 10 MG/ML IV BOLUS
INTRAVENOUS | Status: AC
  Filled 2017-04-09: qty 20

## 2017-04-09 MED ORDER — ATROPINE SULFATE 1 MG/ML IJ SOLN
INTRAMUSCULAR | Status: AC
  Filled 2017-04-09: qty 1

## 2017-04-09 MED ORDER — PROPOFOL 500 MG/50ML IV EMUL
INTRAVENOUS | Status: DC | PRN
  Administered 2017-04-09: 150 ug/kg/min via INTRAVENOUS

## 2017-04-09 MED ORDER — LACTATED RINGERS IV SOLN
INTRAVENOUS | Status: DC | PRN
  Administered 2017-04-09: 09:00:00 via INTRAVENOUS

## 2017-04-09 NOTE — Interval H&P Note (Signed)
History and Physical Interval Note:  04/09/2017 9:01 AM  Patient presents for elective cardioversion with persistent atrial fibrillation on flecainide. She is on Eliquis for stroke prophylaxis and confirms regular doses. She is ready to proceed and reports no major interval change since recent office visit with Dr. Lovena Le.   Rozann Lesches

## 2017-04-09 NOTE — Transfer of Care (Signed)
Immediate Anesthesia Transfer of Care Note  Patient: Lorraine Leonard  Procedure(s) Performed: CARDIOVERSION (N/A )  Patient Location: PACU  Anesthesia Type:MAC  Level of Consciousness: awake, alert , oriented and patient cooperative  Airway & Oxygen Therapy: Patient Spontanous Breathing and Patient connected to nasal cannula oxygen  Post-op Assessment: Report given to RN and Post -op Vital signs reviewed and stable  Post vital signs: Reviewed and stable  Last Vitals:  Vitals:   04/09/17 0809 04/09/17 0945  BP: 126/62   Pulse:  (!) 45  Resp:  12  Temp: 36.5 C (P) 36.5 C  SpO2: 96% 98%    Last Pain:  Vitals:   04/09/17 0809  TempSrc: Oral         Complications: No apparent anesthesia complications

## 2017-04-09 NOTE — Anesthesia Preprocedure Evaluation (Signed)
Anesthesia Evaluation  Patient identified by MRN, date of birth, ID band Patient awake    Reviewed: Allergy & Precautions, NPO status , Patient's Chart, lab work & pertinent test results, reviewed documented beta blocker date and time   Airway Mallampati: III  TM Distance: >3 FB Neck ROM: Full  Mouth opening: Limited Mouth Opening  Dental  (+) Teeth Intact   Pulmonary shortness of breath, sleep apnea , COPD ( Acute on chronic respiratory failure with hypoxia ),  oxygen dependent,  Pulm fibrosis    + decreased breath sounds      Cardiovascular hypertension, Pt. on medications and Pt. on home beta blockers +CHF  + dysrhythmias Atrial Fibrillation  Rhythm:Regular Rate:Normal     Neuro/Psych PSYCHIATRIC DISORDERS Anxiety Depression    GI/Hepatic GERD  ,  Endo/Other  diabetes, Type 2Hypothyroidism   Renal/GU Renal InsufficiencyRenal disease     Musculoskeletal   Abdominal (+) + obese,   Peds  Hematology   Anesthesia Other Findings   Reproductive/Obstetrics                             Anesthesia Physical Anesthesia Plan  ASA: III  Anesthesia Plan: MAC   Post-op Pain Management:    Induction: Intravenous  PONV Risk Score and Plan:   Airway Management Planned: Simple Face Mask  Additional Equipment:   Intra-op Plan:   Post-operative Plan:   Informed Consent: I have reviewed the patients History and Physical, chart, labs and discussed the procedure including the risks, benefits and alternatives for the proposed anesthesia with the patient or authorized representative who has indicated his/her understanding and acceptance.     Plan Discussed with:   Anesthesia Plan Comments:         Anesthesia Quick Evaluation

## 2017-04-09 NOTE — Anesthesia Postprocedure Evaluation (Signed)
Anesthesia Post Note  Patient: Lorraine Leonard  Procedure(s) Performed: CARDIOVERSION (N/A )  Patient location during evaluation: PACU Anesthesia Type: MAC Level of consciousness: awake and alert, oriented and patient cooperative Pain management: pain level controlled Vital Signs Assessment: post-procedure vital signs reviewed and stable Respiratory status: spontaneous breathing and respiratory function stable Cardiovascular status: stable Postop Assessment: no apparent nausea or vomiting Anesthetic complications: no     Last Vitals:  Vitals:   04/09/17 0809 04/09/17 0945  BP: 126/62   Pulse:  (!) 45  Resp:  12  Temp: 36.5 C (P) 36.5 C  SpO2: 96% 98%    Last Pain:  Vitals:   04/09/17 0809  TempSrc: Oral                 Ehab Humber A

## 2017-04-09 NOTE — Discharge Instructions (Signed)
Electrical Cardioversion, Care After °This sheet gives you information about how to care for yourself after your procedure. Your health care provider may also give you more specific instructions. If you have problems or questions, contact your health care provider. °What can I expect after the procedure? °After the procedure, it is common to have: °· Some redness on the skin where the shocks were given. ° °Follow these instructions at home: °· Do not drive for 24 hours if you were given a medicine to help you relax (sedative). °· Take over-the-counter and prescription medicines only as told by your health care provider. °· Ask your health care provider how to check your pulse. Check it often. °· Rest for 48 hours after the procedure or as told by your health care provider. °· Avoid or limit your caffeine use as told by your health care provider. °Contact a health care provider if: °· You feel like your heart is beating too quickly or your pulse is not regular. °· You have a serious muscle cramp that does not go away. °Get help right away if: °· You have discomfort in your chest. °· You are dizzy or you feel faint. °· You have trouble breathing or you are short of breath. °· Your speech is slurred. °· You have trouble moving an arm or leg on one side of your body. °· Your fingers or toes turn cold or blue. °This information is not intended to replace advice given to you by your health care provider. Make sure you discuss any questions you have with your health care provider. °Document Released: 01/26/2013 Document Revised: 11/09/2015 Document Reviewed: 10/12/2015 °Elsevier Interactive Patient Education © 2018 [employer] ° °

## 2017-04-09 NOTE — Progress Notes (Signed)
Electrical Cardioversion Procedure Note WING GFELLER [id number] 16-Jul-1940  Procedure: Electrical Cardioversion Indications:  Atrial Fibrillation  Procedure Details Consent: yes Time Out: Verified patient identification, verified procedure, site/side was marked, verified correct patient position, special equipment/implants available, medications/allergies/relevent history reviewed, required imaging and test results available.  Time Out : 0917 Patient placed on cardiac monitor, pulse oximetry, supplemental oxygen as necessary.  Sedation given:yes Pacer pads placed yes Cardioverted 2 time(s).  Cardioverted at 120 joules and 150 joules Evaluation Findings: Post procedure EKG shows: sinus bradycardia with first degree block Complications: {none Patient did tolerate procedure well.   Hillery Jacks 04/09/2017, 10:03 AM

## 2017-04-09 NOTE — CV Procedure (Addendum)
Elective direct-current cardioversion  Indication: Persistent atrial fibrillation  Description of procedure: Informed consent was obtained and the patient was taken to the procedure suite where timeout was performed. Anterior and posterior pads were placed and connected to the biphasic defibrillator. Sedation was provided by the anesthesia service with use of propofol. With sandbag in place on anterior pad, a single synchronized shock at 120 J was delivered. Subsequent rhythm was atrial fibrillation/flutter with controlled ventricular response. This was confirmed by follow-up ECG. A second synchronized shock at 150 J was then delivered. This was followed by a prolonged sinus pause and then return of sinus rhythm initially in the 30s, increasing into the 40s. Patient was given atropine and ultimately heart rate increased into the 50s. She remained hemodynamic stable and otherwise there were no immediate complications.  Disposition: Patient observed in PACU with stable sinus bradycardia in the 50s. She has been on Toprol-XL 50 mg daily and Cardizem CD 180 mg daily as an outpatient and has a history of sinus node dysfunction with symptomatic sinus bradycardia previously. Plan is to have her stop Toprol-XL completely at this point, otherwise continue her current cardiac regimen. Office follow-up arranged.  Lorraine Leonard, M.D., F.A.C.C.

## 2017-04-10 ENCOUNTER — Telehealth: Payer: Self-pay

## 2017-04-10 ENCOUNTER — Telehealth: Payer: Self-pay | Admitting: Internal Medicine

## 2017-04-10 DIAGNOSIS — J9611 Chronic respiratory failure with hypoxia: Secondary | ICD-10-CM | POA: Diagnosis not present

## 2017-04-10 DIAGNOSIS — I5031 Acute diastolic (congestive) heart failure: Secondary | ICD-10-CM | POA: Diagnosis not present

## 2017-04-10 DIAGNOSIS — I4891 Unspecified atrial fibrillation: Secondary | ICD-10-CM | POA: Diagnosis not present

## 2017-04-10 DIAGNOSIS — I130 Hypertensive heart and chronic kidney disease with heart failure and stage 1 through stage 4 chronic kidney disease, or unspecified chronic kidney disease: Secondary | ICD-10-CM | POA: Diagnosis not present

## 2017-04-10 DIAGNOSIS — E1142 Type 2 diabetes mellitus with diabetic polyneuropathy: Secondary | ICD-10-CM | POA: Diagnosis not present

## 2017-04-10 DIAGNOSIS — J8410 Pulmonary fibrosis, unspecified: Secondary | ICD-10-CM | POA: Diagnosis not present

## 2017-04-10 NOTE — Telephone Encounter (Signed)
-----   Message from Evans Lance, MD sent at 04/07/2017  8:57 PM EST ----- Let her know that her labs are better and that she should continue taking her iron.

## 2017-04-10 NOTE — Telephone Encounter (Signed)
Angie RN ( [facility]) is calling to speak to the nurse about Lorraine Leonard heart rate being at 44 and its irregular. Had a Cardioversion done on yesterday 04/10/17. Please call

## 2017-04-10 NOTE — Telephone Encounter (Signed)
Returned call to [facility] RN.  Notified per Dr. Lovena Le have Pt continue on diltiazem and flecainide (metoprolol discontinued after DCCV).  Pt needs EKG at f/u visit with Dr. Harl Bowie.   Nurse indicates understanding.  Notified that Dr. Lovena Le would be in office on Monday if Pt/nurse have any issues.

## 2017-04-10 NOTE — Telephone Encounter (Signed)
Notified that per Dr. Corliss Parish to change from prescription iron to OTC iron d/t cost.  Pt indicates understanding.

## 2017-04-13 DIAGNOSIS — E1142 Type 2 diabetes mellitus with diabetic polyneuropathy: Secondary | ICD-10-CM | POA: Diagnosis not present

## 2017-04-13 DIAGNOSIS — I130 Hypertensive heart and chronic kidney disease with heart failure and stage 1 through stage 4 chronic kidney disease, or unspecified chronic kidney disease: Secondary | ICD-10-CM | POA: Diagnosis not present

## 2017-04-13 DIAGNOSIS — I5031 Acute diastolic (congestive) heart failure: Secondary | ICD-10-CM | POA: Diagnosis not present

## 2017-04-13 DIAGNOSIS — J9611 Chronic respiratory failure with hypoxia: Secondary | ICD-10-CM | POA: Diagnosis not present

## 2017-04-13 DIAGNOSIS — J8410 Pulmonary fibrosis, unspecified: Secondary | ICD-10-CM | POA: Diagnosis not present

## 2017-04-13 DIAGNOSIS — I4891 Unspecified atrial fibrillation: Secondary | ICD-10-CM | POA: Diagnosis not present

## 2017-04-15 ENCOUNTER — Encounter (HOSPITAL_COMMUNITY): Payer: Self-pay | Admitting: Cardiology

## 2017-04-16 DIAGNOSIS — J9611 Chronic respiratory failure with hypoxia: Secondary | ICD-10-CM | POA: Diagnosis not present

## 2017-04-16 DIAGNOSIS — J8410 Pulmonary fibrosis, unspecified: Secondary | ICD-10-CM | POA: Diagnosis not present

## 2017-04-16 DIAGNOSIS — E1142 Type 2 diabetes mellitus with diabetic polyneuropathy: Secondary | ICD-10-CM | POA: Diagnosis not present

## 2017-04-16 DIAGNOSIS — I5031 Acute diastolic (congestive) heart failure: Secondary | ICD-10-CM | POA: Diagnosis not present

## 2017-04-16 DIAGNOSIS — I130 Hypertensive heart and chronic kidney disease with heart failure and stage 1 through stage 4 chronic kidney disease, or unspecified chronic kidney disease: Secondary | ICD-10-CM | POA: Diagnosis not present

## 2017-04-16 DIAGNOSIS — I4891 Unspecified atrial fibrillation: Secondary | ICD-10-CM | POA: Diagnosis not present

## 2017-04-18 DIAGNOSIS — F329 Major depressive disorder, single episode, unspecified: Secondary | ICD-10-CM | POA: Diagnosis not present

## 2017-04-18 DIAGNOSIS — Z905 Acquired absence of kidney: Secondary | ICD-10-CM | POA: Diagnosis not present

## 2017-04-18 DIAGNOSIS — I4891 Unspecified atrial fibrillation: Secondary | ICD-10-CM | POA: Diagnosis not present

## 2017-04-18 DIAGNOSIS — F419 Anxiety disorder, unspecified: Secondary | ICD-10-CM | POA: Diagnosis not present

## 2017-04-18 DIAGNOSIS — J449 Chronic obstructive pulmonary disease, unspecified: Secondary | ICD-10-CM | POA: Diagnosis not present

## 2017-04-18 DIAGNOSIS — E1142 Type 2 diabetes mellitus with diabetic polyneuropathy: Secondary | ICD-10-CM | POA: Diagnosis not present

## 2017-04-18 DIAGNOSIS — J9611 Chronic respiratory failure with hypoxia: Secondary | ICD-10-CM | POA: Diagnosis not present

## 2017-04-18 DIAGNOSIS — J8410 Pulmonary fibrosis, unspecified: Secondary | ICD-10-CM | POA: Diagnosis not present

## 2017-04-18 DIAGNOSIS — E669 Obesity, unspecified: Secondary | ICD-10-CM | POA: Diagnosis not present

## 2017-04-18 DIAGNOSIS — Z87440 Personal history of urinary (tract) infections: Secondary | ICD-10-CM | POA: Diagnosis not present

## 2017-04-18 DIAGNOSIS — I5031 Acute diastolic (congestive) heart failure: Secondary | ICD-10-CM | POA: Diagnosis not present

## 2017-04-18 DIAGNOSIS — Z9981 Dependence on supplemental oxygen: Secondary | ICD-10-CM | POA: Diagnosis not present

## 2017-04-18 DIAGNOSIS — M069 Rheumatoid arthritis, unspecified: Secondary | ICD-10-CM | POA: Diagnosis not present

## 2017-04-18 DIAGNOSIS — K317 Polyp of stomach and duodenum: Secondary | ICD-10-CM | POA: Diagnosis not present

## 2017-04-18 DIAGNOSIS — H3530 Unspecified macular degeneration: Secondary | ICD-10-CM | POA: Diagnosis not present

## 2017-04-18 DIAGNOSIS — N182 Chronic kidney disease, stage 2 (mild): Secondary | ICD-10-CM | POA: Diagnosis not present

## 2017-04-18 DIAGNOSIS — Z6836 Body mass index (BMI) 36.0-36.9, adult: Secondary | ICD-10-CM | POA: Diagnosis not present

## 2017-04-18 DIAGNOSIS — I272 Pulmonary hypertension, unspecified: Secondary | ICD-10-CM | POA: Diagnosis not present

## 2017-04-18 DIAGNOSIS — I130 Hypertensive heart and chronic kidney disease with heart failure and stage 1 through stage 4 chronic kidney disease, or unspecified chronic kidney disease: Secondary | ICD-10-CM | POA: Diagnosis not present

## 2017-04-18 DIAGNOSIS — G4733 Obstructive sleep apnea (adult) (pediatric): Secondary | ICD-10-CM | POA: Diagnosis not present

## 2017-04-18 DIAGNOSIS — E1122 Type 2 diabetes mellitus with diabetic chronic kidney disease: Secondary | ICD-10-CM | POA: Diagnosis not present

## 2017-04-18 DIAGNOSIS — Z524 Kidney donor: Secondary | ICD-10-CM | POA: Diagnosis not present

## 2017-04-20 ENCOUNTER — Other Ambulatory Visit: Payer: Self-pay | Admitting: Family

## 2017-04-20 DIAGNOSIS — F419 Anxiety disorder, unspecified: Secondary | ICD-10-CM

## 2017-04-20 DIAGNOSIS — J019 Acute sinusitis, unspecified: Secondary | ICD-10-CM

## 2017-04-22 DIAGNOSIS — I130 Hypertensive heart and chronic kidney disease with heart failure and stage 1 through stage 4 chronic kidney disease, or unspecified chronic kidney disease: Secondary | ICD-10-CM | POA: Diagnosis not present

## 2017-04-22 DIAGNOSIS — I4891 Unspecified atrial fibrillation: Secondary | ICD-10-CM | POA: Diagnosis not present

## 2017-04-22 DIAGNOSIS — E1142 Type 2 diabetes mellitus with diabetic polyneuropathy: Secondary | ICD-10-CM | POA: Diagnosis not present

## 2017-04-22 DIAGNOSIS — I5031 Acute diastolic (congestive) heart failure: Secondary | ICD-10-CM | POA: Diagnosis not present

## 2017-04-22 DIAGNOSIS — J9611 Chronic respiratory failure with hypoxia: Secondary | ICD-10-CM | POA: Diagnosis not present

## 2017-04-22 DIAGNOSIS — J8410 Pulmonary fibrosis, unspecified: Secondary | ICD-10-CM | POA: Diagnosis not present

## 2017-04-22 NOTE — Telephone Encounter (Signed)
Last seen 03/24/17  [facility]

## 2017-04-24 ENCOUNTER — Telehealth: Payer: Self-pay | Admitting: Emergency Medicine

## 2017-04-24 DIAGNOSIS — I272 Pulmonary hypertension, unspecified: Secondary | ICD-10-CM

## 2017-04-24 NOTE — Telephone Encounter (Signed)
Spoke with pt. Pt's daughter Joycie Peek is not on her DPR. This will be updated at her next OV. Order will be sent [facility] for [facility]. Nothing further was needed.

## 2017-04-24 NOTE — Telephone Encounter (Signed)
Okay to make this order

## 2017-04-24 NOTE — Telephone Encounter (Signed)
RB ok to place an order for a POC? Please advise.

## 2017-04-28 ENCOUNTER — Encounter: Payer: Self-pay | Admitting: Cardiology

## 2017-04-28 ENCOUNTER — Ambulatory Visit (INDEPENDENT_AMBULATORY_CARE_PROVIDER_SITE_OTHER): Payer: Medicare Other | Admitting: Cardiology

## 2017-04-28 VITALS — BP 124/74 | HR 75 | Ht 67.0 in | Wt 253.0 lb

## 2017-04-28 DIAGNOSIS — J9611 Chronic respiratory failure with hypoxia: Secondary | ICD-10-CM | POA: Diagnosis not present

## 2017-04-28 DIAGNOSIS — E1142 Type 2 diabetes mellitus with diabetic polyneuropathy: Secondary | ICD-10-CM | POA: Diagnosis not present

## 2017-04-28 DIAGNOSIS — I5031 Acute diastolic (congestive) heart failure: Secondary | ICD-10-CM | POA: Diagnosis not present

## 2017-04-28 DIAGNOSIS — I4891 Unspecified atrial fibrillation: Secondary | ICD-10-CM | POA: Diagnosis not present

## 2017-04-28 DIAGNOSIS — I5032 Chronic diastolic (congestive) heart failure: Secondary | ICD-10-CM | POA: Diagnosis not present

## 2017-04-28 DIAGNOSIS — J8410 Pulmonary fibrosis, unspecified: Secondary | ICD-10-CM | POA: Diagnosis not present

## 2017-04-28 DIAGNOSIS — I130 Hypertensive heart and chronic kidney disease with heart failure and stage 1 through stage 4 chronic kidney disease, or unspecified chronic kidney disease: Secondary | ICD-10-CM | POA: Diagnosis not present

## 2017-04-28 NOTE — Progress Notes (Signed)
Clinical Summary Lorraine Leonard is a 77 y.o.female seen today for follow up of the following medical problems.   1. Bradycardia/Afib - cardioversion 10/12/18for afib - Since that time significant fatigue, SOB/DOE, and weight gain - she has lowered the dose of her Toprol to 100mg  daily on her own without imrpvoment of symptoms  - last visit we stopped her Toprol due to bradycardia in the 40s while in SR - recurrent afib with RVR, admitted and evaluted by EP - concerns about her GI status. 09/2016 she had EGD, single gastric polyp removed.  - seen 12/2016 for DCCV, postponed due to anemia and +FOBT - 12/2016 GI appt ok with restarting eliquis. - normal nuclear study 2014. There was thought based on her ST/T changes with high rates she may have CAD. A repeat echo also showed wall measurements of 1.3-1.5 and flecanide was stopped I had started flecanide on patient, there was some concern about her LVH and potential for CAD and it was stopped. Per EP notes best option is tikasyn for patient -  DCCV not performed due to patient had missed a prior dose of anticoagulation. Also concern about repeat EGD with plans for possible repeat biopsy.      - followed by EP. She is on flecanide 75mg  bid, s/p DCCV 04/09/17. Metoprolol stopped after cardioversion due to sinus bradycardia.  - no recent palpitations.   2. Chronic diastolic Hf - home weights 245 at home. Down from 250 lbs. No recent edema, no SOB/DOE  3. Pulmonary fibrosis - followed by pulmionary    Past Medical History:  Diagnosis Date  . Acute diastolic CHF (congestive heart failure) ([place]) 11/11/2016  . Allergic rhinitis   . Anxiety   . Anxiety   . Arthritis   . Atrial fibrillation ([place]) 09/2016  . COPD (chronic obstructive pulmonary disease) ([place])   . Depression   . Diabetes mellitus    pre-diabetic  . Diverticulosis   . Dyspnea   . Dysrhythmia   . GERD (gastroesophageal reflux disease)   . Gout   .  Hyperlipidemia   . Hypertension   . Hypothyroidism   . Lung fibrosis ([place])   . Macular degeneration   . Neuropathy   . Pulmonary fibrosis ([place])   . Rheumatoid arthritis(714.0)   . Sleep apnea    uses 3 liters of o2 24/7  . Urticaria      Allergies  Allergen Reactions  . Allopurinol Swelling    Feet and legs swell  . Mobic [Meloxicam] Swelling and Other (See Comments)    Feet and Leg swelling     Current Outpatient Medications  Medication Sig Dispense Refill  . albuterol (PROVENTIL) (2.5 MG/3ML) 0.083% nebulizer solution Take 3 mLs (2.5 mg total) by nebulization every 6 (six) hours as needed for wheezing or shortness of breath. J96.01 150 mL 1  . azelastine (ASTELIN) 0.1 % nasal spray PLACE 2 SPRAYS INTO BOTH NOSTRILS 2 (TWO) TIMES DAILY AS NEEDED. 30 mL 5  . calcium citrate (CALCITRATE - DOSED IN MG ELEMENTAL CALCIUM) 950 MG tablet Take 1 tablet by mouth 2 (two) times daily.     . cefdinir (OMNICEF) 300 MG capsule Take 1 capsule (300 mg total) 2 (two) times daily by mouth. 1 po BID 20 capsule 0  . Cholecalciferol 2000 units CAPS Take 2,000 Units by mouth daily.     Marland Kitchen conjugated estrogens (PREMARIN) vaginal cream Place 0.5 g vaginally every Thursday. Use as directed    . cyanocobalamin  500 MCG tablet Take 500 mcg by mouth daily.     Marland Kitchen DEXILANT 60 MG capsule Take 60 mg by mouth daily.  5  . diazepam (VALIUM) 5 MG tablet TAKE 1 TABLET BY MOUTH TWICE A DAY AS NEEDED FOR ANXIETY 60 tablet 0  . diltiazem (CARDIZEM CD) 180 MG 24 hr capsule TAKE 1 CAPSULE BY MOUTH TWICE A DAY 60 capsule 5  . docusate sodium (COLACE) 100 MG capsule Take 200 mg by mouth daily as needed for mild constipation.    Marland Kitchen ELIQUIS 5 MG TABS tablet TAKE 1 TABLET BY MOUTH TWICE A DAY 60 tablet 5  . EPINEPHrine 0.3 mg/0.3 mL IJ SOAJ injection Inject 0.3 mg into the muscle once as needed (For anaphylaxis.).    Marland Kitchen FeFum-FePoly-FA-B Cmp-C-Biot (FOLIVANE-PLUS) CAPS Take 1 capsule by mouth 2 (two) times daily.  1  .  ferrous sulfate 325 (65 FE) MG tablet Take 325 mg by mouth 2 (two) times daily with a meal.    . flecainide (TAMBOCOR) 150 MG tablet Take 0.5 tablets (75 mg total) by mouth 2 (two) times daily. 30 tablet 11  . fluticasone (FLONASE) 50 MCG/ACT nasal spray PLACE 1 SPRAY INTO BOTH NOSTRILS 2 (TWO) TIMES DAILY AS NEEDED FOR ALLERGIES OR RHINITIS. 16 g 5  . furosemide (LASIX) 40 MG tablet Take 40 mg ( 1 Tablet) Alternating with 60 mg ( 1 1/2 Tablet) Daily 90 tablet 6  . INCRUSE ELLIPTA 62.5 MCG/INH AEPB TAKE 1 PUFF BY MOUTH EVERY DAY  3  . levocetirizine (XYZAL) 5 MG tablet Take 5 mg by mouth every evening.     Marland Kitchen levothyroxine (SYNTHROID, LEVOTHROID) 100 MCG tablet TAKE 1 TABLET (100 MCG TOTAL) BY MOUTH DAILY BEFORE BREAKFAST. 90 tablet 1  . mometasone (ELOCON) 0.1 % cream APPLY TO AFFECTED AREA DAILY 45 g 2  . Multiple Vitamins-Minerals (PRESERVISION AREDS 2 PO) Take 1 tablet by mouth 2 (two) times daily.    . ondansetron (ZOFRAN) 8 MG tablet Take 8 mg by mouth every 8 (eight) hours as needed for nausea or vomiting.    Marland Kitchen OVER THE COUNTER MEDICATION Place 1 drop into both eyes as needed (For dry eyes.). Systane Gel Drops    . PATADAY 0.2 % SOLN Place 1 drop into both eyes daily.    . polyethylene glycol powder (GLYCOLAX/MIRALAX) powder TAKE 17 G BY MOUTH 2 (TWO) TIMES DAILY AS NEEDED. 3162 g 0  . predniSONE (DELTASONE) 10 MG tablet Take 10 mg by mouth daily with breakfast.    . rosuvastatin (CRESTOR) 10 MG tablet TAKE 1 TABLET BY MOUTH AT BEDTIME 90 tablet 0  . spironolactone (ALDACTONE) 25 MG tablet TAKE 1 TABLET BY MOUTH EVERY DAY 90 tablet 0  . traMADol (ULTRAM) 50 MG tablet TAKE 1 TABLET BY MOUTH EVERY 6 HOURS AS NEEDED (Patient taking differently: TAKE 1 TABLET BY MOUTH EVERY 6 HOURS AS NEEDED FOR PAIN) 90 tablet 2  . VIGAMOX 0.5 % ophthalmic solution Apply 1 drop to eye as needed (after eye injection).   12  . vitamin C (ASCORBIC ACID) 500 MG tablet Take 500 mg by mouth daily.    . Vitamin D,  Ergocalciferol, (DRISDOL) 50000 units CAPS capsule TAKE 1 CAPSULE (50,000 UNITS TOTAL) BY MOUTH EVERY 7 (SEVEN) DAYS. 12 capsule 0  . vitamin E 400 UNIT capsule Take 400 Units by mouth daily.     No current facility-administered medications for this visit.      Past Surgical History:  Procedure Laterality  Date  . CARDIOVERSION N/A 01/30/2017   Procedure: CARDIOVERSION;  Surgeon: Josue Hector, MD;  Location: [facility];  Service: Cardiovascular;  Laterality: N/A;  . CARDIOVERSION N/A 04/09/2017   Procedure: CARDIOVERSION;  Surgeon: Satira Sark, MD;  Location: [facility];  Service: Cardiovascular;  Laterality: N/A;  . CATARACT EXTRACTION, BILATERAL Bilateral   . EUS N/A 10/03/2015   Procedure: UPPER ENDOSCOPIC ULTRASOUND (EUS) RADIAL;  Surgeon: Arta Silence, MD;  Location: [facility];  Service: Endoscopy;  Laterality: N/A;  . FINE NEEDLE ASPIRATION N/A 10/03/2015   Procedure: FINE NEEDLE ASPIRATION (FNA) RADIAL;  Surgeon: Arta Silence, MD;  Location: [facility];  Service: Endoscopy;  Laterality: N/A;  . 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 LUMBAR DISC SURGERY     x 2  . TUBAL LIGATION    . VAGINAL HYSTERECTOMY       Allergies  Allergen Reactions  . Allopurinol Swelling    Feet and legs swell  . Mobic [Meloxicam] Swelling and Other (See Comments)    Feet and Leg swelling      Family History  Problem Relation Age of Onset  . Diabetes Unknown   . Hypertension Unknown   . Coronary artery disease Unknown   . Stroke Unknown   . Asthma Unknown   . Kidney disease Sister   . Lung cancer Sister   . Lung cancer Sister   . Suicidality Son        committed suicide in 2009  . Hyperlipidemia Unknown   . Anxiety disorder Unknown   . Migraines Unknown      Social History Lorraine Leonard reports that  has never smoked. she has never used smokeless tobacco. Lorraine Leonard reports that she does not drink alcohol.   Review of Systems CONSTITUTIONAL: No  weight loss, fever, chills, weakness or fatigue.  HEENT: Eyes: No visual loss, blurred vision, double vision or yellow sclerae.No hearing loss, sneezing, congestion, runny nose or sore throat.  SKIN: No rash or itching.  CARDIOVASCULAR: per hpi RESPIRATORY: per hpi GASTROINTESTINAL: No anorexia, nausea, vomiting or diarrhea. No abdominal pain or blood.  GENITOURINARY: No burning on urination, no polyuria NEUROLOGICAL: No headache, dizziness, syncope, paralysis, ataxia, numbness or tingling in the extremities. No change in bowel or bladder control.  MUSCULOSKELETAL: No muscle, back pain, joint pain or stiffness.  LYMPHATICS: No enlarged nodes. No history of splenectomy.  PSYCHIATRIC: No history of depression or anxiety.  ENDOCRINOLOGIC: No reports of sweating, cold or heat intolerance. No polyuria or polydipsia.  Marland Kitchen   Physical Examinatiion Vitals:   04/28/17 1459  BP: 124/74  Pulse: 75  SpO2: 97%   Vitals:   04/28/17 1459  Weight: 253 lb (114.8 kg)  Height: 5\' 7"  (1.702 m)    Gen: resting comfortably, no acute distress HEENT: no scleral icterus, pupils equal round and reactive, no palptable cervical adenopathy,  CV: irreg, no m/r/g, n ojvd Resp: Clear to auscultation bilaterally GI: abdomen is soft, non-tender, non-distended, normal bowel sounds, no hepatosplenomegaly MSK: extremities are warm, no edema.  Skin: warm, no rash Neuro:  no focal deficits Psych: appropriate affect   Diagnostic Studies 11/2015 echo Study Conclusions  - Left ventricle: The cavity size was normal. There was severe focal basal hypertrophy of the septum. Systolic function was vigorous. The estimated ejection fraction was in the range of 65% to 70%. Wall motion was normal; there were no regional wall motion abnormalities. - Mitral valve: Moderately calcified annulus.  There was mild regurgitation. - Left atrium: Volume/bsa, ES (1-plane Simpson&'s, A4C): 26.5 ml/m^2. - Right  ventricle: The cavity size was normal. Wall thickness was mildly increased. - Pulmonary arteries: Systolic pressure was within the normal range.   09/2016 echo Study Conclusions  - Left ventricle: The cavity size was normal. Wall thickness was increased in a pattern of mild LVH. Systolic function was normal. The estimated ejection fraction was in the range of 60% to 65%. Wall motion was normal; there were no regional wall motion abnormalities. Features are consistent with a pseudonormal left ventricular filling pattern, with concomitant abnormal relaxation and increased filling pressure (grade 2 diastolic dysfunction). - Aortic valve: Mildly calcified annulus. Trileaflet. - Mitral valve: Calcified annulus. - Right atrium: Central venous pressure (est): 3 mm Hg. - Atrial septum: No defect or patent foramen ovale was identified. - Tricuspid valve: There was trivial regurgitation. - Pulmonary arteries: PA peak pressure: 25 mm Hg (S). - Pericardium, extracardiac: A prominent pericardial fat pad was present.  Impressions:  - Mild LVH with LVEF 60-65% and grade 2 diastolic dysfunction. Calcified mitral annulus. Mildly sclerotic aortic valve. Trivial tricuspid regurgitation with normal PASP estimated 25 mmHg. Prominent pericardial fat pad noted.    Assessment and Plan  1. Bradycardia/fatigue/Afib - EKG today shows she is back in afib - will discuss with EP what next step is - higher doses of av nodal agents work to rate control her afib, but cause severe bradycardia when in SR.  2. Chronic diastolic HF - appears euvoelmic, no symptoms - conitnue current meds    F/u 4 months      Arnoldo Lenis, M.D.

## 2017-04-28 NOTE — Patient Instructions (Signed)
Medication Instructions:   Your physician recommends that you continue on your current medications as directed. Please refer to the Current Medication list given to you today.  Labwork:  none  Testing/Procedures:  none  Follow-Up:  Your physician recommends that you schedule a follow-up appointment in: 4 months.   Any Other Special Instructions Will Be Listed Below (If Applicable).  If you need a refill on your cardiac medications before your next appointment, please call your pharmacy. 

## 2017-05-01 ENCOUNTER — Encounter: Payer: Self-pay | Admitting: Cardiology

## 2017-05-01 ENCOUNTER — Telehealth: Payer: Self-pay

## 2017-05-01 NOTE — Telephone Encounter (Signed)
Call back received from Pt.  Pt unsure which of her medications is flecainide, states her granddaughter fills her pill box.  Discussed initiating dofetilide.  Discussed her needing to call her insurance to determine what her copay would be.  Pt unwilling or incapable of call insurance.  This nurse asked if she had someone in her family who could help her.  Pt states to call her granddaughter Lorraine Leonard.  Will discuss plan of care with granddaughter.

## 2017-05-01 NOTE — Telephone Encounter (Signed)
Left message for Pt to return this nurse call.  Need to DC flecainide and initiate inpatient dofetilide.  Will await call back.

## 2017-05-01 NOTE — Telephone Encounter (Signed)
Call placed to granddaughter.  Discussed Pt last visit with Dr. Harl Bowie, notified Pt back in Afib.  Notified of recommendations by Dr. Lovena Le.   Discussed need to call insurance to inquire about copay for dofetilide.  Chas[place] will call.  Briefly discussed dofetilide initiation and need to take medication consistently after initiating.  Granddaughter with some concerns that Pt will be able to adhere with recommendations.  Granddaughter and this nurse agree to get Pt and family in with afib clinic to discuss and formulate plan going forward.   Stacy with afib clinic notified of situation and need for appt.  Afib clinic to meet with family.

## 2017-05-04 DIAGNOSIS — J9611 Chronic respiratory failure with hypoxia: Secondary | ICD-10-CM | POA: Diagnosis not present

## 2017-05-04 DIAGNOSIS — I5031 Acute diastolic (congestive) heart failure: Secondary | ICD-10-CM | POA: Diagnosis not present

## 2017-05-04 DIAGNOSIS — J8410 Pulmonary fibrosis, unspecified: Secondary | ICD-10-CM | POA: Diagnosis not present

## 2017-05-04 DIAGNOSIS — I4891 Unspecified atrial fibrillation: Secondary | ICD-10-CM | POA: Diagnosis not present

## 2017-05-04 DIAGNOSIS — E1142 Type 2 diabetes mellitus with diabetic polyneuropathy: Secondary | ICD-10-CM | POA: Diagnosis not present

## 2017-05-04 DIAGNOSIS — I130 Hypertensive heart and chronic kidney disease with heart failure and stage 1 through stage 4 chronic kidney disease, or unspecified chronic kidney disease: Secondary | ICD-10-CM | POA: Diagnosis not present

## 2017-05-04 NOTE — Telephone Encounter (Signed)
Left message with granddaughter to return my call to further discuss tikosyn start.

## 2017-05-05 ENCOUNTER — Telehealth: Payer: Self-pay | Admitting: Pharmacist

## 2017-05-05 ENCOUNTER — Other Ambulatory Visit (HOSPITAL_COMMUNITY): Payer: Self-pay | Admitting: *Deleted

## 2017-05-05 NOTE — Telephone Encounter (Signed)
Medication list reviewed in anticipation of upcoming Tikosyn initiation. Patient is taking Zofran which is QTc prolonging. Would advise pt to avoid use of this while on Tikosyn. She does not take any other QTc prolonging medications.   Patient is anticoagulated on Eliquis 5mg  BID on the appropriate dose. Please ensure that patient has not missed any anticoagulation doses in the 3 weeks prior to Tikosyn initiation.   Patient will need to be counseled to avoid use of Benadryl while on Tikosyn and in the 2-3 days prior to Tikosyn initiation.

## 2017-05-11 ENCOUNTER — Encounter (HOSPITAL_COMMUNITY): Payer: Self-pay | Admitting: Nurse Practitioner

## 2017-05-11 ENCOUNTER — Inpatient Hospital Stay (HOSPITAL_COMMUNITY): Admission: AD | Admit: 2017-05-11 | Payer: Medicare Other | Source: Ambulatory Visit | Admitting: Internal Medicine

## 2017-05-11 ENCOUNTER — Ambulatory Visit (HOSPITAL_COMMUNITY)
Admission: RE | Admit: 2017-05-11 | Discharge: 2017-05-11 | Disposition: A | Discharge status: Home / Self Care | Payer: Medicare Other | Source: Ambulatory Visit | Attending: Nurse Practitioner | Admitting: Nurse Practitioner

## 2017-05-11 ENCOUNTER — Other Ambulatory Visit: Payer: Self-pay | Admitting: Internal Medicine

## 2017-05-11 ENCOUNTER — Other Ambulatory Visit: Payer: Medicare Other

## 2017-05-11 VITALS — BP 132/66 | HR 139 | Ht 67.0 in | Wt 257.0 lb

## 2017-05-11 DIAGNOSIS — G4730 Sleep apnea, unspecified: Secondary | ICD-10-CM | POA: Diagnosis not present

## 2017-05-11 DIAGNOSIS — M109 Gout, unspecified: Secondary | ICD-10-CM | POA: Insufficient documentation

## 2017-05-11 DIAGNOSIS — Z801 Family history of malignant neoplasm of trachea, bronchus and lung: Secondary | ICD-10-CM | POA: Diagnosis not present

## 2017-05-11 DIAGNOSIS — Z886 Allergy status to analgesic agent status: Secondary | ICD-10-CM | POA: Insufficient documentation

## 2017-05-11 DIAGNOSIS — Z7901 Long term (current) use of anticoagulants: Secondary | ICD-10-CM | POA: Diagnosis not present

## 2017-05-11 DIAGNOSIS — Z9841 Cataract extraction status, right eye: Secondary | ICD-10-CM | POA: Insufficient documentation

## 2017-05-11 DIAGNOSIS — M069 Rheumatoid arthritis, unspecified: Secondary | ICD-10-CM | POA: Diagnosis not present

## 2017-05-11 DIAGNOSIS — Z7952 Long term (current) use of systemic steroids: Secondary | ICD-10-CM | POA: Insufficient documentation

## 2017-05-11 DIAGNOSIS — I481 Persistent atrial fibrillation: Secondary | ICD-10-CM | POA: Insufficient documentation

## 2017-05-11 DIAGNOSIS — Z888 Allergy status to other drugs, medicaments and biological substances status: Secondary | ICD-10-CM | POA: Insufficient documentation

## 2017-05-11 DIAGNOSIS — I10 Essential (primary) hypertension: Secondary | ICD-10-CM | POA: Diagnosis not present

## 2017-05-11 DIAGNOSIS — F329 Major depressive disorder, single episode, unspecified: Secondary | ICD-10-CM | POA: Insufficient documentation

## 2017-05-11 DIAGNOSIS — J449 Chronic obstructive pulmonary disease, unspecified: Secondary | ICD-10-CM | POA: Insufficient documentation

## 2017-05-11 DIAGNOSIS — F419 Anxiety disorder, unspecified: Secondary | ICD-10-CM | POA: Insufficient documentation

## 2017-05-11 DIAGNOSIS — H3530 Unspecified macular degeneration: Secondary | ICD-10-CM | POA: Insufficient documentation

## 2017-05-11 DIAGNOSIS — Z79899 Other long term (current) drug therapy: Secondary | ICD-10-CM | POA: Diagnosis not present

## 2017-05-11 DIAGNOSIS — Z841 Family history of disorders of kidney and ureter: Secondary | ICD-10-CM | POA: Insufficient documentation

## 2017-05-11 DIAGNOSIS — J8410 Pulmonary fibrosis, unspecified: Secondary | ICD-10-CM | POA: Insufficient documentation

## 2017-05-11 DIAGNOSIS — E1140 Type 2 diabetes mellitus with diabetic neuropathy, unspecified: Secondary | ICD-10-CM | POA: Diagnosis not present

## 2017-05-11 DIAGNOSIS — K219 Gastro-esophageal reflux disease without esophagitis: Secondary | ICD-10-CM | POA: Diagnosis not present

## 2017-05-11 DIAGNOSIS — E039 Hypothyroidism, unspecified: Secondary | ICD-10-CM | POA: Insufficient documentation

## 2017-05-11 DIAGNOSIS — I4891 Unspecified atrial fibrillation: Secondary | ICD-10-CM | POA: Diagnosis not present

## 2017-05-11 DIAGNOSIS — E785 Hyperlipidemia, unspecified: Secondary | ICD-10-CM | POA: Diagnosis not present

## 2017-05-11 DIAGNOSIS — I4819 Other persistent atrial fibrillation: Secondary | ICD-10-CM

## 2017-05-11 DIAGNOSIS — Z79890 Hormone replacement therapy (postmenopausal): Secondary | ICD-10-CM | POA: Insufficient documentation

## 2017-05-11 DIAGNOSIS — M1990 Unspecified osteoarthritis, unspecified site: Secondary | ICD-10-CM | POA: Insufficient documentation

## 2017-05-11 DIAGNOSIS — Z9842 Cataract extraction status, left eye: Secondary | ICD-10-CM | POA: Insufficient documentation

## 2017-05-11 LAB — BASIC METABOLIC PANEL
Anion gap: 13 (ref 5–15)
BUN: 14 mg/dL (ref 6–20)
CO2: 27 mmol/L (ref 22–32)
Calcium: 9.9 mg/dL (ref 8.9–10.3)
Chloride: 98 mmol/L — ABNORMAL LOW (ref 101–111)
Creatinine, Ser: 1.14 mg/dL — ABNORMAL HIGH (ref 0.44–1.00)
GFR calc Af Amer: 53 mL/min — ABNORMAL LOW (ref >60)
GFR calc non Af Amer: 46 mL/min — ABNORMAL LOW (ref >60)
Glucose, Bld: 145 mg/dL — ABNORMAL HIGH (ref 65–99)
Potassium: 4 mmol/L (ref 3.5–5.1)
Sodium: 138 mmol/L (ref 135–145)

## 2017-05-11 LAB — MAGNESIUM: Magnesium: 2 mg/dL (ref 1.7–2.4)

## 2017-05-11 MED ORDER — METOPROLOL TARTRATE 25 MG PO TABS: 12.5000 mg | ORAL_TABLET | Freq: Two times a day (BID) | ORAL | 1 refills | Status: DC

## 2017-05-11 NOTE — Patient Instructions (Signed)
Your physician has recommended you make the following change in your medication:  1) Increase lasix to 60mg  for the next 2 days then go back to your normal dosing.  2) Start metoprolol tartrate 1/2 a tablet twice a day - this prescription has been sent to your pharmacy.

## 2017-05-11 NOTE — Progress Notes (Signed)
Primary Care Physician: Sharion Balloon, FNP Referring Physician: [name] is a 77 y.o. female with a h/o afib, that recently failed flecainide. The plan was for pt to come to the afib clinic today for Tikosyn admit. However, she took Zofran last night which is not to be taken for 3 days before admit for Tikosyn and she will not be able to take going forward with Tikosyn.The pt states that she does not know if she could do without Zofan as she has frequent nausea. I informed Dr.Taylor issues with Tikosyn admit. He came to the clinic to speak with the pt as admission for Tikosyn will not be able to be done today with Zofran on board with qt at 471. The plan was made to abort the Tikosyn admit and plans put into place for av nodal ablation and PPM next week.  Today, she denies symptoms of palpitations, chest pain, shortness of breath, orthopnea, PND, lower extremity edema, dizziness, presyncope, syncope, or neurologic sequela. The patient is tolerating medications without difficulties and is otherwise without complaint today.   Past Medical History:  Diagnosis Date  . Acute diastolic CHF (congestive heart failure) ([place]) 11/11/2016  . Allergic rhinitis   . Anxiety   . Anxiety   . Arthritis   . Atrial fibrillation ([place]) 09/2016  . COPD (chronic obstructive pulmonary disease) ([place])   . Depression   . Diabetes mellitus    pre-diabetic  . Diverticulosis   . Dyspnea   . Dysrhythmia   . GERD (gastroesophageal reflux disease)   . Gout   . Hyperlipidemia   . Hypertension   . Hypothyroidism   . Lung fibrosis ([place])   . Macular degeneration   . Neuropathy   . Pulmonary fibrosis ([place])   . Rheumatoid arthritis(714.0)   . Sleep apnea    uses 3 liters of o2 24/7  . Urticaria    Past Surgical History:  Procedure Laterality Date  . CARDIOVERSION N/A 01/30/2017   Procedure: CARDIOVERSION;  Surgeon: Josue Hector, MD;  Location[facility];  Service: Cardiovascular;   Laterality: N/A;  . CARDIOVERSION N/A 04/09/2017   Procedure: CARDIOVERSION;  Surgeon: Satira Sark, MD;  Location: [facility];  Service: Cardiovascular;  Laterality: N/A;  . CATARACT EXTRACTION, BILATERAL Bilateral   . EUS N/A 10/03/2015   Procedure: UPPER ENDOSCOPIC ULTRASOUND (EUS) RADIAL;  Surgeon: Arta Silence, MD;  Location: [facility];  Service: Endoscopy;  Laterality: N/A;  . FINE NEEDLE ASPIRATION N/A 10/03/2015   Procedure: FINE NEEDLE ASPIRATION (FNA) RADIAL;  Surgeon: Arta Silence, MD;  Location: [facility];  Service: Endoscopy;  Laterality: N/A;  . 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 LUMBAR DISC SURGERY     x 2  . TUBAL LIGATION    . VAGINAL HYSTERECTOMY      Current Outpatient Medications  Medication Sig Dispense Refill  . albuterol (PROVENTIL) (2.5 MG/3ML) 0.083% nebulizer solution Take 3 mLs (2.5 mg total) by nebulization every 6 (six) hours as needed for wheezing or shortness of breath. J96.01 150 mL 1  . azelastine (ASTELIN) 0.1 % nasal spray PLACE 2 SPRAYS INTO BOTH NOSTRILS 2 (TWO) TIMES DAILY AS NEEDED. 30 mL 5  . calcium citrate (CALCITRATE - DOSED IN MG ELEMENTAL CALCIUM) 950 MG tablet Take 1 tablet by mouth 2 (two) times daily.     . Cholecalciferol 2000 units CAPS Take 2,000 Units by mouth daily.     Marland Kitchen  cyanocobalamin 500 MCG tablet Take 500 mcg by mouth daily.     Marland Kitchen DEXILANT 60 MG capsule Take 60 mg by mouth daily.  5  . diazepam (VALIUM) 5 MG tablet TAKE 1 TABLET BY MOUTH TWICE A DAY AS NEEDED FOR ANXIETY 60 tablet 0  . diltiazem (CARDIZEM CD) 180 MG 24 hr capsule TAKE 1 CAPSULE BY MOUTH TWICE A DAY 60 capsule 5  . docusate sodium (COLACE) 100 MG capsule Take 200 mg by mouth daily as needed for mild constipation.    Marland Kitchen ELIQUIS 5 MG TABS tablet TAKE 1 TABLET BY MOUTH TWICE A DAY 60 tablet 5  . EPINEPHrine 0.3 mg/0.3 mL IJ SOAJ injection Inject 0.3 mg into the muscle once as needed (For anaphylaxis.).    Marland Kitchen FeFum-FePoly-FA-B Cmp-C-Biot  (FOLIVANE-PLUS) CAPS Take 1 capsule by mouth 2 (two) times daily.  1  . ferrous sulfate 325 (65 FE) MG tablet Take 325 mg by mouth 2 (two) times daily with a meal.    . fluticasone (FLONASE) 50 MCG/ACT nasal spray PLACE 1 SPRAY INTO BOTH NOSTRILS 2 (TWO) TIMES DAILY AS NEEDED FOR ALLERGIES OR RHINITIS. 16 g 5  . furosemide (LASIX) 40 MG tablet Take 40 mg ( 1 Tablet) Alternating with 60 mg ( 1 1/2 Tablet) Daily 90 tablet 6  . INCRUSE ELLIPTA 62.5 MCG/INH AEPB TAKE 1 PUFF BY MOUTH EVERY DAY  3  . levocetirizine (XYZAL) 5 MG tablet Take 5 mg by mouth every evening.     Marland Kitchen levothyroxine (SYNTHROID, LEVOTHROID) 100 MCG tablet TAKE 1 TABLET (100 MCG TOTAL) BY MOUTH DAILY BEFORE BREAKFAST. 90 tablet 1  . mometasone (ELOCON) 0.1 % cream APPLY TO AFFECTED AREA DAILY 45 g 2  . Multiple Vitamins-Minerals (PRESERVISION AREDS 2 PO) Take 1 tablet by mouth 2 (two) times daily.    . ondansetron (ZOFRAN) 8 MG tablet Take 8 mg by mouth every 8 (eight) hours as needed for nausea or vomiting.    Marland Kitchen OVER THE COUNTER MEDICATION Place 1 drop into both eyes as needed (For dry eyes.). Systane Gel Drops    . PATADAY 0.2 % SOLN Place 1 drop into both eyes daily.    . polyethylene glycol powder (GLYCOLAX/MIRALAX) powder TAKE 17 G BY MOUTH 2 (TWO) TIMES DAILY AS NEEDED. 3162 g 0  . predniSONE (DELTASONE) 10 MG tablet Take 10 mg by mouth daily with breakfast.    . rosuvastatin (CRESTOR) 10 MG tablet TAKE 1 TABLET BY MOUTH AT BEDTIME 90 tablet 0  . spironolactone (ALDACTONE) 25 MG tablet TAKE 1 TABLET BY MOUTH EVERY DAY 90 tablet 0  . traMADol (ULTRAM) 50 MG tablet TAKE 1 TABLET BY MOUTH EVERY 6 HOURS AS NEEDED (Patient taking differently: TAKE 1 TABLET BY MOUTH EVERY 6 HOURS AS NEEDED FOR PAIN) 90 tablet 2  . VIGAMOX 0.5 % ophthalmic solution Apply 1 drop to eye as needed (after eye injection).   12  . vitamin C (ASCORBIC ACID) 500 MG tablet Take 1,000 mg by mouth daily.     . Vitamin D, Ergocalciferol, (DRISDOL) 50000 units  CAPS capsule TAKE 1 CAPSULE (50,000 UNITS TOTAL) BY MOUTH EVERY 7 (SEVEN) DAYS. 12 capsule 0  . vitamin E 400 UNIT capsule Take 400 Units by mouth daily.    Marland Kitchen conjugated estrogens (PREMARIN) vaginal cream Place 0.5 g vaginally every Thursday. Use as directed    . metoprolol tartrate (LOPRESSOR) 25 MG tablet Take 0.5 tablets (12.5 mg total) by mouth 2 (two) times daily. 60 tablet  1   No current facility-administered medications for this encounter.     Allergies  Allergen Reactions  . Allopurinol Swelling    Feet and legs swell  . Mobic [Meloxicam] Swelling and Other (See Comments)    Feet and Leg swelling    Social History   Socioeconomic History  . Marital status: Married    Spouse name: Not on file  . Number of children: 4  . Years of education: Not on file  . Highest education level: Not on file  Social Needs  . Financial resource strain: Not on file  . Food insecurity - worry: Not on file  . Food insecurity - inability: Not on file  . Transportation needs - medical: Not on file  . Transportation needs - non-medical: Not on file  Occupational History  . Occupation: retired    Comment: cna  Tobacco Use  . Smoking status: Never Smoker  . Smokeless tobacco: Never Used  Substance and Sexual Activity  . Alcohol use: No  . Drug use: No  . Sexual activity: Not Currently    Birth control/protection: Surgical  Other Topics Concern  . 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 Diabetes Unknown   . Hypertension Unknown   . Coronary artery disease Unknown   . Stroke Unknown   . Asthma Unknown   . Kidney disease Sister   . Lung cancer Sister   . Lung cancer Sister   . Suicidality Son        committed suicide in 2009  . Hyperlipidemia Unknown   . Anxiety disorder Unknown   . Migraines Unknown     ROS- All systems are reviewed and negative except as  per the HPI above  Physical Exam: Vitals:   05/11/17 1033  BP: 132/66  Pulse: (!) 139  Weight: 257 lb (116.6 kg)  Height: 5\' 7"  (1.702 m)   Wt Readings from Last 3 Encounters:  05/11/17 257 lb (116.6 kg)  04/28/17 253 lb (114.8 kg)  04/09/17 257 lb (116.6 kg)    Labs: Lab Results  Component Value Date   NA 138 05/11/2017   K 4.0 05/11/2017   CL 98 (L) 05/11/2017   CO2 27 05/11/2017   GLUCOSE 145 (H) 05/11/2017   BUN 14 05/11/2017   CREATININE 1.14 (H) 05/11/2017   CALCIUM 9.9 05/11/2017   MG 2.0 05/11/2017   Lab Results  Component Value Date   INR 1.40 04/06/2017   Lab Results  Component Value Date   CHOL 119 02/13/2017   HDL 39 (L) 02/13/2017   LDLCALC 56 02/13/2017   TRIG 121 02/13/2017     GEN- The patient is well appearing, alert and oriented x 3 today.   Head- normocephalic, atraumatic Eyes-  Sclera clear, conjunctiva pink Ears- hearing intact Oropharynx- clear Neck- supple, no JVP Lymph- no cervical lymphadenopathy Lungs- Clear to ausculation bilaterally, normal work of breathing Heart- rapid irregular rate and rhythm, no murmurs, rubs or gallops, PMI not laterally displaced GI- soft, NT, ND, + BS Extremities- no clubbing, cyanosis, or edema MS- no significant deformity or atrophy Skin- no rash or lesion Psych- euthymic mood, full affect Neuro- strength and sensation are intact  EKG- afib at 139 bpm, qrs int 82 ms, qtc 471 ms Epic records reviewed    Assessment and Plan: 1. Persistent afib Took  Zofran last night which is not to be taken x 3 days before Tikosyn initiation. Pt expresses concern that she has to have her Zofran as she has frequent nausea Dr. Lovena Le to clinic to speak with pt and Tikosyn admit aborted and plans for next week for AV nodal ablation and PPM She does have afib with RVR and will add low dose metoprolol 25 mg 1/2 tab bid for better rate control Her weight is up a few lbs and will increase lasix to 60 mg x 2 days and  then back to usual  dose of lasix  Marinus Maw, PA in and explained plans for next week for PPM/AV nodal ablation   Butch Penny C. Aamya Orellana, [facility] [address] [place], [place] [address] [phone]

## 2017-05-11 NOTE — Addendum Note (Signed)
Encounter addended by: Sherran Needs, NP on: 05/11/2017 3:24 PM  Actions taken: LOS modified

## 2017-05-12 ENCOUNTER — Telehealth: Payer: Self-pay | Admitting: *Deleted

## 2017-05-12 DIAGNOSIS — I5031 Acute diastolic (congestive) heart failure: Secondary | ICD-10-CM | POA: Diagnosis not present

## 2017-05-12 DIAGNOSIS — E1142 Type 2 diabetes mellitus with diabetic polyneuropathy: Secondary | ICD-10-CM | POA: Diagnosis not present

## 2017-05-12 DIAGNOSIS — J8410 Pulmonary fibrosis, unspecified: Secondary | ICD-10-CM | POA: Diagnosis not present

## 2017-05-12 DIAGNOSIS — I4891 Unspecified atrial fibrillation: Secondary | ICD-10-CM | POA: Diagnosis not present

## 2017-05-12 DIAGNOSIS — I130 Hypertensive heart and chronic kidney disease with heart failure and stage 1 through stage 4 chronic kidney disease, or unspecified chronic kidney disease: Secondary | ICD-10-CM | POA: Diagnosis not present

## 2017-05-12 DIAGNOSIS — J9611 Chronic respiratory failure with hypoxia: Secondary | ICD-10-CM | POA: Diagnosis not present

## 2017-05-12 LAB — CBC WITH DIFFERENTIAL/PLATELET
Basophils Absolute: 0.1 10*3/uL (ref 0.0–0.2)
Basos: 1 %
EOS (ABSOLUTE): 0.2 10*3/uL (ref 0.0–0.4)
Eos: 1 %
Hematocrit: 42 % (ref 34.0–46.6)
Hemoglobin: 13.8 g/dL (ref 11.1–15.9)
Immature Grans (Abs): 0 10*3/uL (ref 0.0–0.1)
Immature Granulocytes: 0 %
Lymphocytes Absolute: 3 10*3/uL (ref 0.7–3.1)
Lymphs: 29 %
MCH: 30.1 pg (ref 26.6–33.0)
MCHC: 32.9 g/dL (ref 31.5–35.7)
MCV: 92 fL (ref 79–97)
Monocytes Absolute: 0.9 10*3/uL (ref 0.1–0.9)
Monocytes: 9 %
Neutrophils Absolute: 6.3 10*3/uL (ref 1.4–7.0)
Neutrophils: 60 %
Platelets: 288 10*3/uL (ref 150–379)
RBC: 4.59 x10E6/uL (ref 3.77–5.28)
RDW: 14.3 % (ref 12.3–15.4)
WBC: 10.4 10*3/uL (ref 3.4–10.8)

## 2017-05-12 NOTE — Telephone Encounter (Signed)
VM from [facility] nurse Wanted to let you know that she fell last week off the piano bench when she was reaching too far

## 2017-05-13 ENCOUNTER — Ambulatory Visit: Payer: Medicare Other | Admitting: *Deleted

## 2017-05-14 ENCOUNTER — Telehealth: Payer: Self-pay | Admitting: Cardiovascular Disease

## 2017-05-14 ENCOUNTER — Encounter: Payer: Self-pay | Admitting: Family

## 2017-05-14 ENCOUNTER — Ambulatory Visit (INDEPENDENT_AMBULATORY_CARE_PROVIDER_SITE_OTHER): Payer: Medicare Other | Admitting: Family

## 2017-05-14 VITALS — BP 114/64 | HR 104 | Temp 97.3°F | Ht 67.0 in | Wt 257.2 lb

## 2017-05-14 DIAGNOSIS — E1169 Type 2 diabetes mellitus with other specified complication: Secondary | ICD-10-CM | POA: Diagnosis not present

## 2017-05-14 DIAGNOSIS — E1142 Type 2 diabetes mellitus with diabetic polyneuropathy: Secondary | ICD-10-CM

## 2017-05-14 DIAGNOSIS — K5901 Slow transit constipation: Secondary | ICD-10-CM | POA: Diagnosis not present

## 2017-05-14 DIAGNOSIS — E039 Hypothyroidism, unspecified: Secondary | ICD-10-CM

## 2017-05-14 DIAGNOSIS — E1159 Type 2 diabetes mellitus with other circulatory complications: Secondary | ICD-10-CM

## 2017-05-14 DIAGNOSIS — J8410 Pulmonary fibrosis, unspecified: Secondary | ICD-10-CM | POA: Diagnosis not present

## 2017-05-14 DIAGNOSIS — F331 Major depressive disorder, recurrent, moderate: Secondary | ICD-10-CM | POA: Diagnosis not present

## 2017-05-14 DIAGNOSIS — I4891 Unspecified atrial fibrillation: Secondary | ICD-10-CM

## 2017-05-14 DIAGNOSIS — I272 Pulmonary hypertension, unspecified: Secondary | ICD-10-CM

## 2017-05-14 DIAGNOSIS — I10 Essential (primary) hypertension: Secondary | ICD-10-CM

## 2017-05-14 DIAGNOSIS — E785 Hyperlipidemia, unspecified: Secondary | ICD-10-CM

## 2017-05-14 DIAGNOSIS — I152 Hypertension secondary to endocrine disorders: Secondary | ICD-10-CM

## 2017-05-14 LAB — BAYER DCA HB A1C WAIVED: HB A1C (BAYER DCA - WAIVED): 6.8 % (ref <7.0)

## 2017-05-14 NOTE — Telephone Encounter (Signed)
Returned call to Pt.  Notified Pt Lorraine Leonard was calling for her med list.  Told Pt to take last med list from our office with her to her procedure and not worry.  Pt indicates understanding.

## 2017-05-14 NOTE — Progress Notes (Signed)
Subjective:    Patient ID: Lorraine Leonard, female    DOB: 18-Sep-1940, 77 y.o.   MRN: [id number]  Pt presents to the office today for chronic follow up. Pt is followed by Cardiologists every 3 months for A Fib and CHF. Pt is scheduled for a pacemaker 05/18/17.  She is followed by her Pulmonologist every 6 months for Pulmonary hypertension, COPD, and Pulmonary fibrosis. Pt is on continuously oxygen at  3 L. States her breathing is stable, but becomes short of breath with any activity.   Diabetes  She presents for her follow-up diabetic visit. She has type 2 diabetes mellitus. Her disease course has been stable. There are no hypoglycemic associated symptoms. Pertinent negatives for hypoglycemia include no confusion. Associated symptoms include fatigue and foot paresthesias. Pertinent negatives for diabetes include no blurred vision, no polyphagia and no weight loss. There are no hypoglycemic complications. Symptoms are stable. Diabetic complications include heart disease, nephropathy and peripheral neuropathy. Pertinent negatives for diabetic complications include no CVA. Risk factors for coronary artery disease include diabetes mellitus, dyslipidemia, obesity, hypertension, sedentary lifestyle, post-menopausal and family history. Her breakfast blood glucose range is generally 110-130 mg/dl.  Hyperlipidemia  This is a chronic problem. The current episode started more than 1 year ago. The problem is controlled. Recent lipid tests were reviewed and are normal. Exacerbating diseases include obesity. Associated symptoms include shortness of breath. Current antihyperlipidemic treatment includes statins. The current treatment provides moderate improvement of lipids. Risk factors for coronary artery disease include dyslipidemia, diabetes mellitus, family history, hypertension and a sedentary lifestyle.  Hypertension  This is a chronic problem. The current episode started more than 1 year ago. The problem has been  resolved since onset. The problem is controlled. Associated symptoms include malaise/fatigue, peripheral edema and shortness of breath. Pertinent negatives include no blurred vision. Risk factors for coronary artery disease include diabetes mellitus, dyslipidemia, family history, obesity, post-menopausal state and sedentary lifestyle. The current treatment provides moderate improvement. Hypertensive end-organ damage includes kidney disease and heart failure. There is no history of CVA. Identifiable causes of hypertension include a thyroid problem.  Thyroid Problem  Presents for follow-up visit. Symptoms include constipation and fatigue. Patient reports no weight loss. The symptoms have been stable. Her past medical history is significant for heart failure and hyperlipidemia.  Gastroesophageal Reflux  She reports no belching, no coughing or no heartburn. This is a chronic problem. The current episode started more than 1 year ago. The problem occurs occasionally. The problem has been waxing and waning. Associated symptoms include fatigue. Pertinent negatives include no weight loss. Risk factors include obesity. She has tried a PPI for the symptoms.  Depression         This is a chronic problem.  The current episode started more than 1 year ago.   The onset quality is gradual.   The problem occurs intermittently.  The problem has been waxing and waning since onset.  Associated symptoms include fatigue, restlessness and sad.  Associated symptoms include no helplessness and no hopelessness.  Past medical history includes thyroid problem.   Constipation  This is a chronic problem. The current episode started more than 1 year ago. The problem has been waxing and waning since onset. Pertinent negatives include no weight loss.      Review of Systems  Constitutional: Positive for fatigue and malaise/fatigue. Negative for weight loss.  Eyes: Negative for blurred vision.  Respiratory: Positive for shortness of  breath. Negative for cough.   Gastrointestinal:  Positive for constipation. Negative for heartburn.  Endocrine: Negative for polyphagia.  Psychiatric/Behavioral: Positive for depression. Negative for confusion.  All other systems reviewed and are negative.      Objective:   Physical Exam  Constitutional: She is oriented to person, place, and time. She appears well-developed and well-nourished. No distress.  HENT:  Head: Normocephalic and atraumatic.  Left Ear: External ear normal.  Nose: Nose normal.  Mouth/Throat: Oropharynx is clear and moist.  Eyes: Pupils are equal, round, and reactive to light.  Neck: Normal range of motion. Neck supple. No thyromegaly present.  Cardiovascular: Normal rate, normal heart sounds and intact distal pulses. An irregular rhythm present.  No murmur heard. Pulmonary/Chest: Effort normal. No respiratory distress. She has decreased breath sounds. She has no wheezes.  3 L O2   Abdominal: Soft. Bowel sounds are normal. She exhibits no distension. There is no tenderness.  Musculoskeletal: Normal range of motion. She exhibits no edema or tenderness.  Neurological: She is alert and oriented to person, place, and time.  Skin: Skin is warm and dry.  Psychiatric: She has a normal mood and affect. Her behavior is normal. Judgment and thought content normal.  Vitals reviewed.     BP 114/64   Pulse (!) 104   Temp (!) 97.3 F (36.3 C) (Oral)   Ht _0  (1.702 m)   Wt 257 lb 3.2 oz (116.7 kg)   SpO2 97% Comment: On 3 liters of oxygen, sitting  BMI 40.28 kg/m      Assessment & Plan:  1. Hypertension associated with diabetes ([place]) - CMP14+EGFR  2. Pulmonary hypertension ([facility]) - CMP14+EGFR  3. Atrial fibrillation with rapid ventricular response ([facility])  - CMP14+EGFR  4. Pulmonary fibrosis ([facility]) - CMP14+EGFR  5. Hypothyroidism, unspecified type - CMP14+EGFR - TSH  6. Type 2 diabetes mellitus with diabetic polyneuropathy, without long-term current  use of insulin ([facility])  - Bayer DCA Hb A1c Waived - CMP14+EGFR  7. Morbid obesity ([facility])  - CMP14+EGFR  8. Slow transit constipation - CMP14+EGFR  9. Hyperlipidemia associated with type 2 diabetes mellitus ([facility])  - CMP14+EGFR - Lipid panel  10. Moderate episode of recurrent major depressive disorder ([place]) - CMP14+EGFR   Continue all meds Labs pending Health Maintenance reviewed Diet and exercise encouraged RTO 6 months   Evelina Dun, FNP

## 2017-05-14 NOTE — Telephone Encounter (Signed)
Patient calling, states that she received voicemail asking her to call back and verify medications.

## 2017-05-14 NOTE — Patient Instructions (Signed)
Diabetes Mellitus and Nutrition When you have diabetes (diabetes mellitus), it is very important to have healthy eating habits because your blood sugar (glucose) levels are greatly affected by what you eat and drink. Eating healthy foods in the appropriate amounts, at about the same times every day, can help you:  Control your blood glucose.  Lower your risk of heart disease.  Improve your blood pressure.  Reach or maintain a healthy weight.  Every person with diabetes is different, and each person has different needs for a meal plan. Your health care provider may recommend that you work with a diet and nutrition specialist (dietitian) to make a meal plan that is best for you. Your meal plan may vary depending on factors such as:  The calories you need.  The medicines you take.  Your weight.  Your blood glucose, blood pressure, and cholesterol levels.  Your activity level.  Other health conditions you have, such as heart or kidney disease.  How do carbohydrates affect me? Carbohydrates affect your blood glucose level more than any other type of food. Eating carbohydrates naturally increases the amount of glucose in your blood. Carbohydrate counting is a method for keeping track of how many carbohydrates you eat. Counting carbohydrates is important to keep your blood glucose at a healthy level, especially if you use insulin or take certain oral diabetes medicines. It is important to know how many carbohydrates you can safely have in each meal. This is different for every person. Your dietitian can help you calculate how many carbohydrates you should have at each meal and for snack. Foods that contain carbohydrates include:  Bread, cereal, rice, pasta, and crackers.  Potatoes and corn.  Peas, beans, and lentils.  Milk and yogurt.  Fruit and juice.  Desserts, such as cakes, cookies, ice cream, and candy.  How does alcohol affect me? Alcohol can cause a sudden decrease in blood  glucose (hypoglycemia), especially if you use insulin or take certain oral diabetes medicines. Hypoglycemia can be a life-threatening condition. Symptoms of hypoglycemia (sleepiness, dizziness, and confusion) are similar to symptoms of having too much alcohol. If your health care provider says that alcohol is safe for you, follow these guidelines:  Limit alcohol intake to no more than 1 drink per day for nonpregnant women and 2 drinks per day for men. One drink equals 12 oz of beer, 5 oz of wine, or 1 oz of hard liquor.  Do not drink on an empty stomach.  Keep yourself hydrated with water, diet soda, or unsweetened iced tea.  Keep in mind that regular soda, juice, and other mixers may contain a lot of sugar and must be counted as carbohydrates.  What are tips for following this plan? Reading food labels  Start by checking the serving size on the label. The amount of calories, carbohydrates, fats, and other nutrients listed on the label are based on one serving of the food. Many foods contain more than one serving per package.  Check the total grams (g) of carbohydrates in one serving. You can calculate the number of servings of carbohydrates in one serving by dividing the total carbohydrates by 15. For example, if a food has 30 g of total carbohydrates, it would be equal to 2 servings of carbohydrates.  Check the number of grams (g) of saturated and trans fats in one serving. Choose foods that have low or no amount of these fats.  Check the number of milligrams (mg) of sodium in one serving. Most people   should limit total sodium intake to less than 2,300 mg per day.  Always check the nutrition information of foods labeled as "low-fat" or "nonfat". These foods may be higher in added sugar or refined carbohydrates and should be avoided.  Talk to your dietitian to identify your daily goals for nutrients listed on the label. Shopping  Avoid buying canned, premade, or processed foods. These  foods tend to be high in fat, sodium, and added sugar.  Shop around the outside edge of the grocery store. This includes fresh fruits and vegetables, bulk grains, fresh meats, and fresh dairy. Cooking  Use low-heat cooking methods, such as baking, instead of high-heat cooking methods like deep frying.  Cook using healthy oils, such as olive, canola, or sunflower oil.  Avoid cooking with butter, cream, or high-fat meats. Meal planning  Eat meals and snacks regularly, preferably at the same times every day. Avoid going long periods of time without eating.  Eat foods high in fiber, such as fresh fruits, vegetables, beans, and whole grains. Talk to your dietitian about how many servings of carbohydrates you can eat at each meal.  Eat 4-6 ounces of lean protein each day, such as lean meat, chicken, fish, eggs, or tofu. 1 ounce is equal to 1 ounce of meat, chicken, or fish, 1 egg, or 1/4 cup of tofu.  Eat some foods each day that contain healthy fats, such as avocado, nuts, seeds, and fish. Lifestyle   Check your blood glucose regularly.  Exercise at least 30 minutes 5 or more days each week, or as told by your health care provider.  Take medicines as told by your health care provider.  Do not use any products that contain nicotine or tobacco, such as cigarettes and e-cigarettes. If you need help quitting, ask your health care provider.  Work with a counselor or diabetes educator to identify strategies to manage stress and any emotional and social challenges. What are some questions to ask my health care provider?  Do I need to meet with a diabetes educator?  Do I need to meet with a dietitian?  What number can I call if I have questions?  When are the best times to check my blood glucose? Where to find more information:  American Diabetes Association: diabetes.org/food-and-fitness/food  Academy of Nutrition and Dietetics:  www.eatright.org/resources/health/diseases-and-conditions/diabetes  National Institute of Diabetes and Digestive and Kidney Diseases (NIH): www.niddk.nih.gov/health-information/diabetes/overview/diet-eating-physical-activity Summary  A healthy meal plan will help you control your blood glucose and maintain a healthy lifestyle.  Working with a diet and nutrition specialist (dietitian) can help you make a meal plan that is best for you.  Keep in mind that carbohydrates and alcohol have immediate effects on your blood glucose levels. It is important to count carbohydrates and to use alcohol carefully. This information is not intended to replace advice given to you by your health care provider. Make sure you discuss any questions you have with your health care provider. Document Released: 01/02/2005 Document Revised: 05/12/2016 Document Reviewed: 05/12/2016 Elsevier Interactive Patient Education  2018 [employer]vier Inc.  

## 2017-05-14 NOTE — Telephone Encounter (Signed)
Spoke with pt she stated someone from Dr Lovena Le office call about medications, will forward to Dr Lovena Le nurse

## 2017-05-15 LAB — CMP14+EGFR
ALT: 25 IU/L (ref 0–32)
AST: 38 IU/L (ref 0–40)
Albumin/Globulin Ratio: 1.1 — ABNORMAL LOW (ref 1.2–2.2)
Albumin: 4.2 g/dL (ref 3.5–4.8)
Alkaline Phosphatase: 88 IU/L (ref 39–117)
BUN/Creatinine Ratio: 17 (ref 12–28)
BUN: 18 mg/dL (ref 8–27)
Bilirubin Total: 0.3 mg/dL (ref 0.0–1.2)
CO2: 29 mmol/L (ref 20–29)
Calcium: 10.5 mg/dL — ABNORMAL HIGH (ref 8.7–10.3)
Chloride: 98 mmol/L (ref 96–106)
Creatinine, Ser: 1.06 mg/dL — ABNORMAL HIGH (ref 0.57–1.00)
GFR calc Af Amer: 59 mL/min/{1.73_m2} — ABNORMAL LOW (ref >59)
GFR calc non Af Amer: 51 mL/min/{1.73_m2} — ABNORMAL LOW (ref >59)
Globulin, Total: 3.8 g/dL (ref 1.5–4.5)
Glucose: 119 mg/dL — ABNORMAL HIGH (ref 65–99)
Potassium: 5 mmol/L (ref 3.5–5.2)
Sodium: 143 mmol/L (ref 134–144)
Total Protein: 8 g/dL (ref 6.0–8.5)

## 2017-05-15 LAB — LIPID PANEL
Chol/HDL Ratio: 3.4 ratio (ref 0.0–4.4)
Cholesterol, Total: 144 mg/dL (ref 100–199)
HDL: 42 mg/dL (ref >39)
LDL Calculated: 64 mg/dL (ref 0–99)
Triglycerides: 192 mg/dL — ABNORMAL HIGH (ref 0–149)
VLDL Cholesterol Cal: 38 mg/dL (ref 5–40)

## 2017-05-15 LAB — TSH: TSH: 3.82 u[IU]/mL (ref 0.450–4.500)

## 2017-05-18 ENCOUNTER — Ambulatory Visit (HOSPITAL_COMMUNITY)
Admission: RE | Admit: 2017-05-18 | Discharge: 2017-05-19 | Disposition: A | Discharge status: Home / Self Care | Payer: Medicare Other | Source: Ambulatory Visit | Attending: Internal Medicine | Admitting: Internal Medicine

## 2017-05-18 ENCOUNTER — Encounter (HOSPITAL_COMMUNITY): Payer: Self-pay | Admitting: General Practice

## 2017-05-18 ENCOUNTER — Ambulatory Visit (HOSPITAL_COMMUNITY)
Admission: RE | Disposition: A | Discharge status: Home / Self Care | Payer: Self-pay | Source: Ambulatory Visit | Attending: Internal Medicine

## 2017-05-18 ENCOUNTER — Other Ambulatory Visit: Payer: Self-pay

## 2017-05-18 DIAGNOSIS — Z950 Presence of cardiac pacemaker: Secondary | ICD-10-CM

## 2017-05-18 DIAGNOSIS — Z79890 Hormone replacement therapy (postmenopausal): Secondary | ICD-10-CM | POA: Diagnosis not present

## 2017-05-18 DIAGNOSIS — R001 Bradycardia, unspecified: Secondary | ICD-10-CM | POA: Diagnosis not present

## 2017-05-18 DIAGNOSIS — Z79899 Other long term (current) drug therapy: Secondary | ICD-10-CM | POA: Insufficient documentation

## 2017-05-18 DIAGNOSIS — G4733 Obstructive sleep apnea (adult) (pediatric): Secondary | ICD-10-CM | POA: Insufficient documentation

## 2017-05-18 DIAGNOSIS — Z7901 Long term (current) use of anticoagulants: Secondary | ICD-10-CM | POA: Insufficient documentation

## 2017-05-18 DIAGNOSIS — Z888 Allergy status to other drugs, medicaments and biological substances status: Secondary | ICD-10-CM | POA: Diagnosis not present

## 2017-05-18 DIAGNOSIS — M069 Rheumatoid arthritis, unspecified: Secondary | ICD-10-CM | POA: Insufficient documentation

## 2017-05-18 DIAGNOSIS — M1990 Unspecified osteoarthritis, unspecified site: Secondary | ICD-10-CM | POA: Insufficient documentation

## 2017-05-18 DIAGNOSIS — J449 Chronic obstructive pulmonary disease, unspecified: Secondary | ICD-10-CM | POA: Insufficient documentation

## 2017-05-18 DIAGNOSIS — I110 Hypertensive heart disease with heart failure: Secondary | ICD-10-CM | POA: Diagnosis not present

## 2017-05-18 DIAGNOSIS — I442 Atrioventricular block, complete: Secondary | ICD-10-CM | POA: Diagnosis not present

## 2017-05-18 DIAGNOSIS — I481 Persistent atrial fibrillation: Secondary | ICD-10-CM | POA: Diagnosis not present

## 2017-05-18 DIAGNOSIS — Z7952 Long term (current) use of systemic steroids: Secondary | ICD-10-CM | POA: Insufficient documentation

## 2017-05-18 DIAGNOSIS — I5032 Chronic diastolic (congestive) heart failure: Secondary | ICD-10-CM | POA: Insufficient documentation

## 2017-05-18 DIAGNOSIS — H3530 Unspecified macular degeneration: Secondary | ICD-10-CM | POA: Diagnosis not present

## 2017-05-18 DIAGNOSIS — E039 Hypothyroidism, unspecified: Secondary | ICD-10-CM | POA: Insufficient documentation

## 2017-05-18 DIAGNOSIS — I4891 Unspecified atrial fibrillation: Secondary | ICD-10-CM | POA: Diagnosis present

## 2017-05-18 DIAGNOSIS — E1140 Type 2 diabetes mellitus with diabetic neuropathy, unspecified: Secondary | ICD-10-CM | POA: Diagnosis not present

## 2017-05-18 DIAGNOSIS — E785 Hyperlipidemia, unspecified: Secondary | ICD-10-CM | POA: Insufficient documentation

## 2017-05-18 DIAGNOSIS — J8410 Pulmonary fibrosis, unspecified: Secondary | ICD-10-CM | POA: Insufficient documentation

## 2017-05-18 HISTORY — PX: AV NODE ABLATION: EP1193

## 2017-05-18 HISTORY — PX: AV NODE ABLATION: SHX1209

## 2017-05-18 HISTORY — PX: INSERT / REPLACE / REMOVE PACEMAKER: SUR710

## 2017-05-18 HISTORY — DX: Unspecified macular degeneration: H35.30

## 2017-05-18 HISTORY — DX: Low back pain: M54.5

## 2017-05-18 HISTORY — DX: Personal history of other medical treatment: Z92.89

## 2017-05-18 HISTORY — DX: Presence of cardiac pacemaker: Z95.0

## 2017-05-18 HISTORY — DX: Rheumatoid arthritis, unspecified: M06.9

## 2017-05-18 HISTORY — DX: Unspecified atrial fibrillation: I48.91

## 2017-05-18 HISTORY — DX: Dependence on supplemental oxygen: Z99.81

## 2017-05-18 HISTORY — PX: PACEMAKER IMPLANT: EP1218

## 2017-05-18 HISTORY — DX: Other chronic pain: G89.29

## 2017-05-18 HISTORY — DX: Prediabetes: R73.03

## 2017-05-18 HISTORY — DX: Low back pain, unspecified: M54.50

## 2017-05-18 LAB — SURGICAL PCR SCREEN
MRSA, PCR: NEGATIVE
Staphylococcus aureus: NEGATIVE

## 2017-05-18 LAB — GLUCOSE, CAPILLARY
Glucose-Capillary: 153 mg/dL — ABNORMAL HIGH (ref 65–99)
Glucose-Capillary: 99 mg/dL (ref 65–99)

## 2017-05-18 SURGERY — AV NODE ABLATION

## 2017-05-18 MED ORDER — LIDOCAINE HCL 1 % IJ SOLN
INTRAMUSCULAR | Status: AC
  Filled 2017-05-18: qty 20

## 2017-05-18 MED ORDER — HEPARIN (PORCINE) IN NACL 2-0.9 UNIT/ML-% IJ SOLN
INTRAMUSCULAR | Status: AC
  Filled 2017-05-18: qty 500

## 2017-05-18 MED ORDER — SODIUM CHLORIDE 0.9 % IR SOLN
Status: AC
  Filled 2017-05-18: qty 2

## 2017-05-18 MED ORDER — ROSUVASTATIN CALCIUM 10 MG PO TABS
10.0000 mg | ORAL_TABLET | Freq: Every day | ORAL | Status: DC
Start: 2017-05-18 — End: 2017-05-19
  Administered 2017-05-18: 10 mg via ORAL
  Filled 2017-05-18: qty 1

## 2017-05-18 MED ORDER — CEFAZOLIN SODIUM-DEXTROSE 1-4 GM/50ML-% IV SOLN
1.0000 g | Freq: Four times a day (QID) | INTRAVENOUS | Status: DC
  Administered 2017-05-18: 1 g via INTRAVENOUS
  Filled 2017-05-18 (×3): qty 50

## 2017-05-18 MED ORDER — ONDANSETRON HCL 4 MG/2ML IJ SOLN: 4.0000 mg | Freq: Four times a day (QID) | INTRAMUSCULAR | Status: DC | PRN

## 2017-05-18 MED ORDER — LEVOTHYROXINE SODIUM 100 MCG PO TABS
100.0000 ug | ORAL_TABLET | Freq: Every day | ORAL | Status: DC
  Administered 2017-05-19: 100 ug via ORAL
  Filled 2017-05-18: qty 1

## 2017-05-18 MED ORDER — CEFAZOLIN SODIUM-DEXTROSE 2-4 GM/100ML-% IV SOLN
2.0000 g | INTRAVENOUS | Status: AC
  Administered 2017-05-18: 2 g via INTRAVENOUS

## 2017-05-18 MED ORDER — DILTIAZEM HCL ER COATED BEADS 180 MG PO CP24
180.0000 mg | ORAL_CAPSULE | Freq: Two times a day (BID) | ORAL | Status: DC
  Administered 2017-05-18 – 2017-05-19 (×2): 180 mg via ORAL
  Filled 2017-05-18 (×2): qty 1

## 2017-05-18 MED ORDER — TRAMADOL HCL 50 MG PO TABS: 50.0000 mg | ORAL_TABLET | Freq: Four times a day (QID) | ORAL | Status: DC | PRN

## 2017-05-18 MED ORDER — ONDANSETRON HCL 8 MG PO TABS
8.0000 mg | ORAL_TABLET | Freq: Three times a day (TID) | ORAL | Status: DC | PRN
Start: 2017-05-18 — End: 2017-05-19
  Filled 2017-05-18: qty 1

## 2017-05-18 MED ORDER — FUROSEMIDE 40 MG PO TABS
40.0000 mg | ORAL_TABLET | Freq: Every day | ORAL | Status: DC
  Administered 2017-05-18 – 2017-05-19 (×2): 40 mg via ORAL
  Filled 2017-05-18 (×2): qty 1

## 2017-05-18 MED ORDER — CEFAZOLIN SODIUM-DEXTROSE 2-4 GM/100ML-% IV SOLN
INTRAVENOUS | Status: AC
  Filled 2017-05-18: qty 100

## 2017-05-18 MED ORDER — METOPROLOL TARTRATE 25 MG PO TABS
25.0000 mg | ORAL_TABLET | Freq: Two times a day (BID) | ORAL | Status: DC
  Administered 2017-05-18 – 2017-05-19 (×2): 25 mg via ORAL
  Filled 2017-05-18 (×2): qty 1

## 2017-05-18 MED ORDER — SODIUM CHLORIDE 0.9 % IV SOLN
INTRAVENOUS | Status: DC
  Administered 2017-05-18: 13:00:00 via INTRAVENOUS

## 2017-05-18 MED ORDER — PREDNISONE 10 MG PO TABS
10.0000 mg | ORAL_TABLET | Freq: Every day | ORAL | Status: DC
  Administered 2017-05-19: 08:00:00 10 mg via ORAL
  Filled 2017-05-18: qty 1
  Filled 2017-05-18: qty 2

## 2017-05-18 MED ORDER — SODIUM CHLORIDE 0.9 % IR SOLN
80.0000 mg | Status: AC
  Administered 2017-05-18: 80 mg

## 2017-05-18 MED ORDER — ACETAMINOPHEN 325 MG PO TABS
325.0000 mg | ORAL_TABLET | ORAL | Status: DC | PRN
  Administered 2017-05-18: 650 mg via ORAL
  Filled 2017-05-18: qty 2

## 2017-05-18 MED ORDER — DIAZEPAM 5 MG PO TABS
5.0000 mg | ORAL_TABLET | Freq: Two times a day (BID) | ORAL | Status: DC | PRN
  Administered 2017-05-19: 02:00:00 5 mg via ORAL
  Filled 2017-05-18: qty 1

## 2017-05-18 MED ORDER — FENTANYL CITRATE (PF) 100 MCG/2ML IJ SOLN
INTRAMUSCULAR | Status: DC | PRN
  Administered 2017-05-18: 25 ug via INTRAVENOUS
  Administered 2017-05-18 (×3): 12.5 ug via INTRAVENOUS

## 2017-05-18 MED ORDER — HEPARIN (PORCINE) IN NACL 2-0.9 UNIT/ML-% IJ SOLN
INTRAMUSCULAR | Status: AC | PRN
  Administered 2017-05-18 (×2): 500 mL

## 2017-05-18 MED ORDER — BUPIVACAINE HCL (PF) 0.25 % IJ SOLN
INTRAMUSCULAR | Status: DC | PRN
  Administered 2017-05-18: 30 mL

## 2017-05-18 MED ORDER — FENTANYL CITRATE (PF) 100 MCG/2ML IJ SOLN
INTRAMUSCULAR | Status: AC
  Filled 2017-05-18: qty 2

## 2017-05-18 MED ORDER — CEFAZOLIN SODIUM-DEXTROSE 1-4 GM/50ML-% IV SOLN: 1.0000 g | Freq: Four times a day (QID) | INTRAVENOUS | Status: DC

## 2017-05-18 MED ORDER — SPIRONOLACTONE 25 MG PO TABS
25.0000 mg | ORAL_TABLET | Freq: Every day | ORAL | Status: DC
  Administered 2017-05-19: 25 mg via ORAL
  Filled 2017-05-18: qty 1

## 2017-05-18 MED ORDER — CHLORHEXIDINE GLUCONATE 4 % EX LIQD
60.0000 mL | Freq: Once | CUTANEOUS | Status: DC
  Filled 2017-05-18: qty 60

## 2017-05-18 MED ORDER — YOU HAVE A PACEMAKER BOOK
Freq: Once | Status: AC
  Administered 2017-05-18: 22:00:00
  Filled 2017-05-18: qty 1

## 2017-05-18 MED ORDER — LIDOCAINE HCL (PF) 1 % IJ SOLN
INTRAMUSCULAR | Status: DC | PRN
  Administered 2017-05-18: 60 mL

## 2017-05-18 MED ORDER — MIDAZOLAM HCL 5 MG/5ML IJ SOLN
INTRAMUSCULAR | Status: DC | PRN
  Administered 2017-05-18: 2 mg via INTRAVENOUS
  Administered 2017-05-18 (×3): 1 mg via INTRAVENOUS

## 2017-05-18 MED ORDER — BUPIVACAINE HCL (PF) 0.25 % IJ SOLN
INTRAMUSCULAR | Status: AC
  Filled 2017-05-18: qty 30

## 2017-05-18 MED ORDER — MUPIROCIN 2 % EX OINT
TOPICAL_OINTMENT | CUTANEOUS | Status: AC
  Administered 2017-05-18: 1
  Filled 2017-05-18: qty 22

## 2017-05-18 MED ORDER — MIDAZOLAM HCL 5 MG/5ML IJ SOLN
INTRAMUSCULAR | Status: AC
  Filled 2017-05-18: qty 5

## 2017-05-18 SURGICAL SUPPLY — 17 items
BAG SNAP BAND KOVER 36X36 (MISCELLANEOUS) ×1 IMPLANT
CABLE SURGICAL S-101-97-12 (CABLE) ×1 IMPLANT
CATH BLAZER 7FR 4MM LG 5031TK2 (ABLATOR) ×1 IMPLANT
CATH RIGHTSITE C315HIS02 (CATHETERS) ×1 IMPLANT
IPG PACE AZUR XT DR MRI W1DR01 (Pacemaker) IMPLANT
LEAD CAPSURE NOVUS 5076-58CM (Lead) ×1 IMPLANT
LEAD SELECT SECURE 3830 383069 (Lead) IMPLANT
PACE AZURE XT DR MRI W1DR01 (Pacemaker) ×2 IMPLANT
PACK EP LATEX FREE (CUSTOM PROCEDURE TRAY) ×2
PACK EP LF (CUSTOM PROCEDURE TRAY) IMPLANT
PAD DEFIB LIFELINK (PAD) ×1 IMPLANT
SELECT SECURE 3830 383069 (Lead) ×2 IMPLANT
SHEATH CLASSIC 7F (SHEATH) ×2 IMPLANT
SHEATH PINNACLE 8F 10CM (SHEATH) ×1 IMPLANT
SLITTER 6232ADJ (MISCELLANEOUS) ×1 IMPLANT
TRAY PACEMAKER INSERTION (PACKS) ×1 IMPLANT
WIRE HI TORQ VERSACORE-J 145CM (WIRE) ×1 IMPLANT

## 2017-05-18 NOTE — Progress Notes (Signed)
Site area: right groin a 8 french arterial sheath was removed  Site Prior to Removal:  Level 0  Pressure Applied For 20 MINUTES    Bedrest Beginning at 1705p  Manual:   Yes.    Patient Status During Pull:  stable  Post Pull Groin Site:  Level 0  Post Pull Instructions Given:  Yes.    Post Pull Pulses Present:  Yes.    Dressing Applied:  Yes.    Comments:  VS remain stable

## 2017-05-18 NOTE — H&P (Signed)
Primary Care Physician: Lorraine Balloon, FNP Referring Physician: Dr. Sheela Leonard is a 77 y.o. female with a h/o afib, that recently failed flecainide. The plan was for pt to come to the afib clinic today for Tikosyn admit. However, she took Zofran last night which is not to be taken for 3 days before admit for Tikosyn and she will not be able to take going forward with Tikosyn.The pt states that she does not know if she could do without Zofan as she has frequent nausea. I informed Dr.Willma Leonard issues with Tikosyn admit. He came to the clinic to speak with the pt as admission for Tikosyn will not be able to be done today with Zofran on board with qt at 471. The plan was made to abort the Tikosyn admit and plans put into place for av nodal ablation and PPM next week.  Today, she denies symptoms of palpitations, chest pain, shortness of breath, orthopnea, PND, lower extremity edema, dizziness, presyncope, syncope, or neurologic sequela. The patient is tolerating medications without difficulties and is otherwise without complaint today.       Past Medical History:  Diagnosis Date  . Acute diastolic CHF (congestive heart failure) ([place]) 11/11/2016  . Allergic rhinitis   . Anxiety   . Anxiety   . Arthritis   . Atrial fibrillation ([place]) 09/2016  . COPD (chronic obstructive pulmonary disease) ([place])   . Depression   . Diabetes mellitus    pre-diabetic  . Diverticulosis   . Dyspnea   . Dysrhythmia   . GERD (gastroesophageal reflux disease)   . Gout   . Hyperlipidemia   . Hypertension   . Hypothyroidism   . Lung fibrosis ([place])   . Macular degeneration   . Neuropathy   . Pulmonary fibrosis ([place])   . Rheumatoid arthritis(714.0)   . Sleep apnea    uses 3 liters of o2 24/7  . Urticaria         Past Surgical History:  Procedure Laterality Date  . CARDIOVERSION N/A 01/30/2017   Procedure: CARDIOVERSION;  Surgeon: Josue Hector, MD;  [facility];  Service: Cardiovascular;  Laterality: N/A;  . CARDIOVERSION N/A 04/09/2017   Procedure: CARDIOVERSION;  Surgeon: Satira Sark, MD;  Location: [facility];  Service: Cardiovascular;  Laterality: N/A;  . CATARACT EXTRACTION, BILATERAL Bilateral   . EUS N/A 10/03/2015   Procedure: UPPER ENDOSCOPIC ULTRASOUND (EUS) RADIAL;  Surgeon: Arta Silence, MD;  Location: [facility];  Service: Endoscopy;  Laterality: N/A;  . FINE NEEDLE ASPIRATION N/A 10/03/2015   Procedure: FINE NEEDLE ASPIRATION (FNA) RADIAL;  Surgeon: Arta Silence, MD;  Location: [facility];  Service: Endoscopy;  Laterality: N/A;  . 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 LUMBAR DISC SURGERY     x 2  . TUBAL LIGATION    . VAGINAL HYSTERECTOMY      Current Outpatient Medications  Medication Sig Dispense Refill  . albuterol (PROVENTIL) (2.5 MG/3ML) 0.083% nebulizer solution Take 3 mLs (2.5 mg total) by nebulization every 6 (six) hours as needed for wheezing or shortness of breath. J96.01 150 mL 1  . azelastine (ASTELIN) 0.1 % nasal spray PLACE 2 SPRAYS INTO BOTH NOSTRILS 2 (TWO) TIMES DAILY AS NEEDED. 30 mL 5  . calcium citrate (CALCITRATE - DOSED IN MG ELEMENTAL CALCIUM) 950 MG tablet Take 1 tablet by mouth 2 (two) times daily.     . Cholecalciferol 2000 units CAPS Take  2,000 Units by mouth daily.     . cyanocobalamin 500 MCG tablet Take 500 mcg by mouth daily.     Marland Kitchen DEXILANT 60 MG capsule Take 60 mg by mouth daily.  5  . diazepam (VALIUM) 5 MG tablet TAKE 1 TABLET BY MOUTH TWICE A DAY AS NEEDED FOR ANXIETY 60 tablet 0  . diltiazem (CARDIZEM CD) 180 MG 24 hr capsule TAKE 1 CAPSULE BY MOUTH TWICE A DAY 60 capsule 5  . docusate sodium (COLACE) 100 MG capsule Take 200 mg by mouth daily as needed for mild constipation.    Marland Kitchen ELIQUIS 5 MG TABS tablet TAKE 1 TABLET BY MOUTH TWICE A DAY 60 tablet 5  . EPINEPHrine 0.3 mg/0.3 mL IJ SOAJ injection Inject 0.3 mg into the muscle once as  needed (For anaphylaxis.).    Marland Kitchen FeFum-FePoly-FA-B Cmp-C-Biot (FOLIVANE-PLUS) CAPS Take 1 capsule by mouth 2 (two) times daily.  1  . ferrous sulfate 325 (65 FE) MG tablet Take 325 mg by mouth 2 (two) times daily with a meal.    . fluticasone (FLONASE) 50 MCG/ACT nasal spray PLACE 1 SPRAY INTO BOTH NOSTRILS 2 (TWO) TIMES DAILY AS NEEDED FOR ALLERGIES OR RHINITIS. 16 g 5  . furosemide (LASIX) 40 MG tablet Take 40 mg ( 1 Tablet) Alternating with 60 mg ( 1 1/2 Tablet) Daily 90 tablet 6  . INCRUSE ELLIPTA 62.5 MCG/INH AEPB TAKE 1 PUFF BY MOUTH EVERY DAY  3  . levocetirizine (XYZAL) 5 MG tablet Take 5 mg by mouth every evening.     Marland Kitchen levothyroxine (SYNTHROID, LEVOTHROID) 100 MCG tablet TAKE 1 TABLET (100 MCG TOTAL) BY MOUTH DAILY BEFORE BREAKFAST. 90 tablet 1  . mometasone (ELOCON) 0.1 % cream APPLY TO AFFECTED AREA DAILY 45 g 2  . Multiple Vitamins-Minerals (PRESERVISION AREDS 2 PO) Take 1 tablet by mouth 2 (two) times daily.    . ondansetron (ZOFRAN) 8 MG tablet Take 8 mg by mouth every 8 (eight) hours as needed for nausea or vomiting.    Marland Kitchen OVER THE COUNTER MEDICATION Place 1 drop into both eyes as needed (For dry eyes.). Systane Gel Drops    . PATADAY 0.2 % SOLN Place 1 drop into both eyes daily.    . polyethylene glycol powder (GLYCOLAX/MIRALAX) powder TAKE 17 G BY MOUTH 2 (TWO) TIMES DAILY AS NEEDED. 3162 g 0  . predniSONE (DELTASONE) 10 MG tablet Take 10 mg by mouth daily with breakfast.    . rosuvastatin (CRESTOR) 10 MG tablet TAKE 1 TABLET BY MOUTH AT BEDTIME 90 tablet 0  . spironolactone (ALDACTONE) 25 MG tablet TAKE 1 TABLET BY MOUTH EVERY DAY 90 tablet 0  . traMADol (ULTRAM) 50 MG tablet TAKE 1 TABLET BY MOUTH EVERY 6 HOURS AS NEEDED (Patient taking differently: TAKE 1 TABLET BY MOUTH EVERY 6 HOURS AS NEEDED FOR PAIN) 90 tablet 2  . VIGAMOX 0.5 % ophthalmic solution Apply 1 drop to eye as needed (after eye injection).   12  . vitamin C (ASCORBIC ACID) 500 MG tablet Take  1,000 mg by mouth daily.     . Vitamin D, Ergocalciferol, (DRISDOL) 50000 units CAPS capsule TAKE 1 CAPSULE (50,000 UNITS TOTAL) BY MOUTH EVERY 7 (SEVEN) DAYS. 12 capsule 0  . vitamin E 400 UNIT capsule Take 400 Units by mouth daily.    Marland Kitchen conjugated estrogens (PREMARIN) vaginal cream Place 0.5 g vaginally every Thursday. Use as directed    . metoprolol tartrate (LOPRESSOR) 25 MG tablet Take 0.5 tablets (12.5  mg total) by mouth 2 (two) times daily. 60 tablet 1   No current facility-administered medications for this encounter.          Allergies  Allergen Reactions  . Allopurinol Swelling    Feet and legs swell  . Mobic [Meloxicam] Swelling and Other (See Comments)    Feet and Leg swelling    Social History        Socioeconomic History  . Marital status: Married    Spouse name: Not on file  . Number of children: 4  . Years of education: Not on file  . Highest education level: Not on file  Social Needs  . Financial resource strain: Not on file  . Food insecurity - worry: Not on file  . Food insecurity - inability: Not on file  . Transportation needs - medical: Not on file  . Transportation needs - non-medical: Not on file  Occupational History  . Occupation: retired    Comment: cna  Tobacco Use  . Smoking status: Never Smoker  . Smokeless tobacco: Never Used  Substance and Sexual Activity  . Alcohol use: No  . Drug use: No  . Sexual activity: Not Currently    Birth control/protection: Surgical  Other Topics Concern  . 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 Diabetes Unknown   . Hypertension Unknown   . Coronary artery disease Unknown   . Stroke Unknown   . Asthma Unknown   . Kidney disease Sister   . Lung cancer Sister   . Lung cancer Sister   . Suicidality Son        committed suicide in 2009  .  Hyperlipidemia Unknown   . Anxiety disorder Unknown   . Migraines Unknown     ROS- All systems are reviewed and negative except as per the HPI above  Physical Exam:    Vitals:   05/11/17 1033  BP: 132/66  Pulse: (!) 139  Weight: 257 lb (116.6 kg)  Height: 5\' 7"  (1.702 m)      Wt Readings from Last 3 Encounters:  05/11/17 257 lb (116.6 kg)  04/28/17 253 lb (114.8 kg)  04/09/17 257 lb (116.6 kg)    Labs: RecentLabs       Lab Results  Component Value Date   NA 138 05/11/2017   K 4.0 05/11/2017   CL 98 (L) 05/11/2017   CO2 27 05/11/2017   GLUCOSE 145 (H) 05/11/2017   BUN 14 05/11/2017   CREATININE 1.14 (H) 05/11/2017   CALCIUM 9.9 05/11/2017   MG 2.0 05/11/2017     RecentLabs       Lab Results  Component Value Date   INR 1.40 04/06/2017     RecentLabs       Lab Results  Component Value Date   CHOL 119 02/13/2017   HDL 39 (L) 02/13/2017   LDLCALC 56 02/13/2017   TRIG 121 02/13/2017       GEN- The patient is well appearing, alert and oriented x 3 today.   Head- normocephalic, atraumatic Eyes-  Sclera clear, conjunctiva pink Ears- hearing intact Oropharynx- clear Neck- supple, no JVP Lymph- no cervical lymphadenopathy Lungs- Clear to ausculation bilaterally, normal work of breathing Heart- rapid irregular rate and rhythm, no murmurs, rubs or gallops, PMI not laterally displaced GI- soft,  NT, ND, + BS Extremities- no clubbing, cyanosis, or edema MS- no significant deformity or atrophy Skin- no rash or lesion Psych- euthymic mood, full affect Neuro- strength and sensation are intact  EKG- afib at 139 bpm, qrs int 82 ms, qtc 471 ms Epic records reviewed    Assessment and Plan: 1. Persistent afib Took Zofran last night which is not to be taken x 3 days before Tikosyn initiation. Pt expresses concern that she has to have her Zofran as she has frequent nausea Dr. Lovena Leonard to clinic to speak with pt and Tikosyn  admit aborted and plans for next week for AV nodal ablation and PPM She does have afib with RVR and will add low dose metoprolol 25 mg 1/2 tab bid for better rate control Her weight is up a few lbs and will increase lasix to 60 mg x 2 days and then back to usual  dose of lasix  Lorraine Maw, PA in and explained plans for next week for PPM/AV nodal ablation   Lorraine [facility] [address] [place], [place] [address] [phone]  EP Attending  Patient seen and examined. Agree with above. The patient presents today for AV node ablation and His bundle pacing. She has a long h/o atrial fib with RVR and we tried her initially on flecainde which was ineffective at maintaining NSR. We had planned to bring her in for initiation of dofetilide but she takes Zofran for chronic nausea which is contra-indicated and the patient decided that she would not want to take dofetilide. She now present for AV node ablation and insertion of a His bundle PPM.  Mikle Bosworth.D.

## 2017-05-19 ENCOUNTER — Ambulatory Visit (HOSPITAL_COMMUNITY): Payer: Medicare Other

## 2017-05-19 ENCOUNTER — Encounter (HOSPITAL_COMMUNITY): Payer: Self-pay | Admitting: Internal Medicine

## 2017-05-19 DIAGNOSIS — J449 Chronic obstructive pulmonary disease, unspecified: Secondary | ICD-10-CM | POA: Diagnosis not present

## 2017-05-19 DIAGNOSIS — Z950 Presence of cardiac pacemaker: Secondary | ICD-10-CM | POA: Diagnosis not present

## 2017-05-19 DIAGNOSIS — I4891 Unspecified atrial fibrillation: Secondary | ICD-10-CM | POA: Diagnosis not present

## 2017-05-19 DIAGNOSIS — I442 Atrioventricular block, complete: Secondary | ICD-10-CM | POA: Diagnosis not present

## 2017-05-19 DIAGNOSIS — E1140 Type 2 diabetes mellitus with diabetic neuropathy, unspecified: Secondary | ICD-10-CM | POA: Diagnosis not present

## 2017-05-19 DIAGNOSIS — M1990 Unspecified osteoarthritis, unspecified site: Secondary | ICD-10-CM | POA: Diagnosis not present

## 2017-05-19 DIAGNOSIS — R001 Bradycardia, unspecified: Secondary | ICD-10-CM | POA: Diagnosis not present

## 2017-05-19 DIAGNOSIS — I110 Hypertensive heart disease with heart failure: Secondary | ICD-10-CM | POA: Diagnosis not present

## 2017-05-19 DIAGNOSIS — E039 Hypothyroidism, unspecified: Secondary | ICD-10-CM | POA: Diagnosis not present

## 2017-05-19 DIAGNOSIS — I481 Persistent atrial fibrillation: Secondary | ICD-10-CM | POA: Diagnosis not present

## 2017-05-19 DIAGNOSIS — E785 Hyperlipidemia, unspecified: Secondary | ICD-10-CM | POA: Diagnosis not present

## 2017-05-19 DIAGNOSIS — I5032 Chronic diastolic (congestive) heart failure: Secondary | ICD-10-CM | POA: Diagnosis not present

## 2017-05-19 DIAGNOSIS — H3530 Unspecified macular degeneration: Secondary | ICD-10-CM | POA: Diagnosis not present

## 2017-05-19 DIAGNOSIS — M069 Rheumatoid arthritis, unspecified: Secondary | ICD-10-CM | POA: Diagnosis not present

## 2017-05-19 MED ORDER — CEFAZOLIN SODIUM-DEXTROSE 1-4 GM/50ML-% IV SOLN
1.0000 g | Freq: Four times a day (QID) | INTRAVENOUS | Status: AC
  Administered 2017-05-19 (×2): 1 g via INTRAVENOUS
  Filled 2017-05-19 (×2): qty 50

## 2017-05-19 MED ORDER — APIXABAN 5 MG PO TABS: 5.0000 mg | ORAL_TABLET | Freq: Two times a day (BID) | ORAL | 5 refills | Status: DC

## 2017-05-19 MED FILL — Lidocaine HCl Local Inj 1%: INTRAMUSCULAR | Qty: 60 | Status: AC

## 2017-05-19 NOTE — Care Management Note (Signed)
Case Management Note  Patient Details  Name: MELONI HINZ MRN: [id number] Date of Birth: 09-Oct-1940  Subjective/Objective:  From home with spouse, s/p AV node ablation, pacemaker implant.  She is active with [facility], will resume at dc.  No new orders needed, she is OIB status.  Her spouse will bring her home oxygen tank when he comes to transport her home.                 Action/Plan: DC home with resumption of HHRN with [facility].  Expected Discharge Date:  05/19/17               Expected Discharge Plan:  [place]  In-House Referral:     Discharge planning Services  CM Consult  Post Acute Care Choice:  Home Health, Resumption of Svcs/PTA Provider Choice offered to:     DME Arranged:    DME Agency:     HH Arranged:  RN [place] Agency:  [facility]  Status of Service:  Completed, signed off  If discussed at [place] of Stay Meetings, dates discussed:    Additional Comments:  Zenon Mayo, RN 05/19/2017, 11:14 AM

## 2017-05-19 NOTE — Discharge Summary (Signed)
ELECTROPHYSIOLOGY PROCEDURE DISCHARGE SUMMARY    Patient ID: Lorraine Leonard,  MRN: [id number], DOB/AGE: 08/21/1940 77 y.o.  Admit date: 05/18/2017 Discharge date: 05/19/2017  Primary Care Physician: Sharion Balloon, FNP Primary Cardiologist: Harl Bowie Electrophysiologist: Lovena Le  Primary Discharge Diagnosis:  Uncontrolled AF with RVR status post pacemaker implantation and AVN ablation this admission  Secondary Discharge Diagnosis:  1.  Pulmonary fibrosis 2.  Diabetes 3.  COPD 4.  OSA  Allergies  Allergen Reactions  . Allopurinol Swelling    Feet and legs swell  . Mobic [Meloxicam] Swelling and Other (See Comments)    Feet and Leg swelling     Procedures This Admission:  1.  Implantation of a MDT His Bundle chamber PPM on 05/18/17 by Dr Lovena Le.  The patient received a MDT model number Azure PPM with model number 0938 His Bundle lead and 5076 right ventricular lead. There were no immediate post procedure complications. 2.  CXR on 05/19/17 demonstrated no pneumothorax status post device implantation.   Brief HPI: Lorraine Leonard is a 77 y.o. female with persistent symptomatic atrial fibrillation.  She has failed medical therapy with Flecainide. She was not felt to be a good candidate for Tikosyn with use of zofran at home.  Risks, benefits, and alternatives to PPM implantation and AVN ablation were reviewed with the patient who wished to proceed.   Hospital Course:  The patient was admitted and underwent implantation of a MDT His Bundle pacemaker and AVN ablation with details as outlined above.  She  was monitored on telemetry overnight which demonstrated AF with controlled ventricular rate.  Left chest was without hematoma or ecchymosis.  The device was interrogated and found to be functioning normally.  CXR was obtained and demonstrated no pneumothorax status post device implantation.  Wound care, arm mobility, and restrictions were reviewed with the patient.  The patient was  examined and considered stable for discharge to home.    Physical Exam: Vitals:   05/18/17 1820 05/18/17 2037 05/19/17 0520 05/19/17 0750  BP: 119/70 116/62 123/69 (!) 141/55  Pulse:    80  Resp: 19 16 17 20   Temp:  97.6 F (36.4 C) 97.8 F (36.6 C) (!) 97.2 F (36.2 C)  TempSrc:  Oral Oral Oral  SpO2: 99% 100% 99% 98%  Weight:      Height:        GEN- The patient is well appearing, alert and oriented x 3 today.   HEENT: normocephalic, atraumatic; sclera clear, conjunctiva pink; hearing intact; oropharynx clear; neck supple Lungs- Clear to ausculation bilaterally, normal work of breathing.  No wheezes, rales, rhonchi Heart- Irregular rate and rhythm  GI- soft, non-tender, non-distended, bowel sounds present  Extremities- no clubbing, cyanosis, or edema  MS- no significant deformity or atrophy Skin- warm and dry, no rash or lesion, left chest without hematoma/ecchymosis Psych- euthymic mood, full affect Neuro- strength and sensation are intact   Labs:   Lab Results  Component Value Date   WBC 10.4 05/11/2017   HGB 13.8 05/11/2017   HCT 42.0 05/11/2017   MCV 92 05/11/2017   PLT 288 05/11/2017    Recent Labs  Lab 05/14/17 1551  NA 143  K 5.0  CL 98  CO2 29  BUN 18  CREATININE 1.06*  CALCIUM 10.5*  PROT 8.0  BILITOT 0.3  ALKPHOS 88  ALT 25  AST 38  GLUCOSE 119*    Discharge Medications:  Allergies as of 05/19/2017  Reactions   Allopurinol Swelling   Feet and legs swell   Mobic [meloxicam] Swelling, Other (See Comments)   Feet and Leg swelling      Medication List    TAKE these medications   albuterol (2.5 MG/3ML) 0.083% nebulizer solution Commonly known as:  PROVENTIL Take 3 mLs (2.5 mg total) by nebulization every 6 (six) hours as needed for wheezing or shortness of breath. J96.01   apixaban 5 MG Tabs tablet Commonly known as:  ELIQUIS Take 1 tablet (5 mg total) by mouth 2 (two) times daily. Resume 05/23/17 What changed:    how much to  take  additional instructions   azelastine 0.1 % nasal spray Commonly known as:  ASTELIN PLACE 2 SPRAYS INTO BOTH NOSTRILS 2 (TWO) TIMES DAILY AS NEEDED.   calcium citrate 950 MG tablet Commonly known as:  CALCITRATE - dosed in mg elemental calcium Take 1 tablet by mouth 2 (two) times daily.   Cholecalciferol 2000 units Caps Take 2,000 Units by mouth daily.   conjugated estrogens vaginal cream Commonly known as:  PREMARIN Place 0.5 g vaginally as needed. Weekly prn.  Use as directed   cyanocobalamin 1000 MCG tablet Take 1,000 mcg by mouth daily.   DEXILANT 60 MG capsule Generic drug:  dexlansoprazole Take 60 mg by mouth daily.   diazepam 5 MG tablet Commonly known as:  VALIUM TAKE 1 TABLET BY MOUTH TWICE A DAY AS NEEDED FOR ANXIETY   diltiazem 180 MG 24 hr capsule Commonly known as:  CARDIZEM CD TAKE 1 CAPSULE BY MOUTH TWICE A DAY   docusate sodium 100 MG capsule Commonly known as:  COLACE Take 200 mg by mouth daily as needed for mild constipation.   EPINEPHrine 0.3 mg/0.3 mL Soaj injection Commonly known as:  EPI-PEN Inject 0.3 mg into the muscle once as needed (For anaphylaxis.).   ferrous sulfate 325 (65 FE) MG tablet Take 325 mg by mouth 2 (two) times daily with a meal.   fluticasone 50 MCG/ACT nasal spray Commonly known as:  FLONASE PLACE 1 SPRAY INTO BOTH NOSTRILS 2 (TWO) TIMES DAILY AS NEEDED FOR ALLERGIES OR RHINITIS. What changed:  when to take this   FOLIVANE-PLUS Caps Take 1 capsule by mouth 2 (two) times daily.   furosemide 40 MG tablet Commonly known as:  LASIX Take 40 mg ( 1 Tablet) Alternating with 60 mg ( 1 1/2 Tablet) Daily What changed:    how much to take  how to take this  when to take this  additional instructions   INCRUSE ELLIPTA 62.5 MCG/INH Aepb Generic drug:  umeclidinium bromide TAKE 1 PUFF BY MOUTH EVERY DAY   levocetirizine 5 MG tablet Commonly known as:  XYZAL Take 5 mg by mouth every evening.   levothyroxine 100  MCG tablet Commonly known as:  SYNTHROID, LEVOTHROID TAKE 1 TABLET (100 MCG TOTAL) BY MOUTH DAILY BEFORE BREAKFAST.   metoprolol tartrate 25 MG tablet Commonly known as:  LOPRESSOR Take 0.5 tablets (12.5 mg total) by mouth 2 (two) times daily.   ondansetron 8 MG tablet Commonly known as:  ZOFRAN Take 8 mg by mouth every 8 (eight) hours as needed for nausea or vomiting.   OVER THE COUNTER MEDICATION Place 1 drop into both eyes as needed (For dry eyes.). Systane Gel Drops   PATADAY 0.2 % Soln Generic drug:  Olopatadine HCl Place 1 drop into both eyes daily.   polyethylene glycol powder powder Commonly known as:  GLYCOLAX/MIRALAX TAKE 17 G BY MOUTH 2 (  TWO) TIMES DAILY AS NEEDED.   predniSONE 10 MG tablet Commonly known as:  DELTASONE Take 10 mg by mouth daily with breakfast.   PRESERVISION AREDS 2 PO Take 1 tablet by mouth 2 (two) times daily.   rosuvastatin 10 MG tablet Commonly known as:  CRESTOR TAKE 1 TABLET BY MOUTH AT BEDTIME   spironolactone 25 MG tablet Commonly known as:  ALDACTONE TAKE 1 TABLET BY MOUTH EVERY DAY   traMADol 50 MG tablet Commonly known as:  ULTRAM TAKE 1 TABLET BY MOUTH EVERY 6 HOURS AS NEEDED What changed:    how much to take  how to take this  when to take this   VIGAMOX 0.5 % ophthalmic solution Generic drug:  moxifloxacin Apply 1 drop to eye as needed (after eye injection).   vitamin C 500 MG tablet Commonly known as:  ASCORBIC ACID Take 1,000 mg by mouth daily.   Vitamin D (Ergocalciferol) 50000 units Caps capsule Commonly known as:  DRISDOL TAKE 1 CAPSULE (50,000 UNITS TOTAL) BY MOUTH EVERY 7 (SEVEN) DAYS.   vitamin E 400 UNIT capsule Take 400 Units by mouth daily.       Disposition:  Discharge Instructions    Diet - low sodium heart healthy   Complete by:  As directed    Increase activity slowly   Complete by:  As directed      Follow-up Information    [place] [facility] Follow up on 06/01/2017.     Specialty:  Cardiology Why:  at [facility] information: [address], [address]       Evans Lance, MD Follow up on 06/18/2017.   Specialty:  Cardiology Why:  at 12:15PM  Contact information: [address] [place] [address] [phone]           Duration of Discharge Encounter: Greater than 30 minutes including physician time.  Signed, Chanetta Marshall, NP 05/19/2017 8:48 AM  EP Attending  Patient seen and examined. Agree with the findings as noted above. CXR has been reviewed. Her PM interogation under my direct supervision demonstrates normal device function. Her AV node modification has resulted in HR's in the 60-80, down from 130-150. Incision looks good. She is stable for DC home with usual followup. Because she does not have CHB, we have left her lower rate at 50. If her conduction were to worsen as an outpatient we would increase her pacing rate.   Mikle Bosworth.D.

## 2017-05-19 NOTE — Discharge Instructions (Signed)
° ° °  Supplemental Discharge Instructions for  Pacemaker/Defibrillator Patients  Activity No heavy lifting or vigorous activity with your left/right arm for 6 to 8 weeks.  Do not raise your left/right arm above your head for one week.  Gradually raise your affected arm as drawn below.           __        05/23/17                     05/24/17                        05/25/17                  05/26/17  NO DRIVING for  1 week   ; you may begin driving on  08/26/30   .  WOUND CARE - Keep the wound area clean and dry.  Do not get this area wet for one week. No showers for one week; you may shower on  05/26/17   . - The tape/steri-strips on your wound will fall off; do not pull them off.  No bandage is needed on the site.  DO  NOT apply any creams, oils, or ointments to the wound area. - If you notice any drainage or discharge from the wound, any swelling or bruising at the site, or you develop a fever > 101? F after you are discharged home, call the office at once.  Special Instructions - You are still able to use cellular telephones; use the ear opposite the side where you have your pacemaker/defibrillator.  Avoid carrying your cellular phone near your device. - When traveling through airports, show security personnel your identification card to avoid being screened in the metal detectors.  Ask the security personnel to use the hand wand. - Avoid arc welding equipment, MRI testing (magnetic resonance imaging), TENS units (transcutaneous nerve stimulators).  Call the office for questions about other devices. - Avoid electrical appliances that are in poor condition or are not properly grounded. - Microwave ovens are safe to be near or to operate.

## 2017-05-19 NOTE — Progress Notes (Signed)
Pt's ride is here.  Home oxygen available in vehicle.  Pt accompanied down by nursing staff and placed on home O2 tank.  Pt has no s/s of any acute distress or c/o pain

## 2017-05-20 DIAGNOSIS — E1142 Type 2 diabetes mellitus with diabetic polyneuropathy: Secondary | ICD-10-CM | POA: Diagnosis not present

## 2017-05-20 DIAGNOSIS — J9611 Chronic respiratory failure with hypoxia: Secondary | ICD-10-CM | POA: Diagnosis not present

## 2017-05-20 DIAGNOSIS — I4891 Unspecified atrial fibrillation: Secondary | ICD-10-CM | POA: Diagnosis not present

## 2017-05-20 DIAGNOSIS — J8410 Pulmonary fibrosis, unspecified: Secondary | ICD-10-CM | POA: Diagnosis not present

## 2017-05-20 DIAGNOSIS — I5031 Acute diastolic (congestive) heart failure: Secondary | ICD-10-CM | POA: Diagnosis not present

## 2017-05-20 DIAGNOSIS — I130 Hypertensive heart and chronic kidney disease with heart failure and stage 1 through stage 4 chronic kidney disease, or unspecified chronic kidney disease: Secondary | ICD-10-CM | POA: Diagnosis not present

## 2017-05-26 DIAGNOSIS — J9611 Chronic respiratory failure with hypoxia: Secondary | ICD-10-CM | POA: Diagnosis not present

## 2017-05-26 DIAGNOSIS — E1142 Type 2 diabetes mellitus with diabetic polyneuropathy: Secondary | ICD-10-CM | POA: Diagnosis not present

## 2017-05-26 DIAGNOSIS — I130 Hypertensive heart and chronic kidney disease with heart failure and stage 1 through stage 4 chronic kidney disease, or unspecified chronic kidney disease: Secondary | ICD-10-CM | POA: Diagnosis not present

## 2017-05-26 DIAGNOSIS — I5031 Acute diastolic (congestive) heart failure: Secondary | ICD-10-CM | POA: Diagnosis not present

## 2017-05-26 DIAGNOSIS — J8410 Pulmonary fibrosis, unspecified: Secondary | ICD-10-CM | POA: Diagnosis not present

## 2017-05-26 DIAGNOSIS — I4891 Unspecified atrial fibrillation: Secondary | ICD-10-CM | POA: Diagnosis not present

## 2017-06-01 ENCOUNTER — Encounter: Payer: Self-pay | Admitting: *Deleted

## 2017-06-01 ENCOUNTER — Ambulatory Visit (INDEPENDENT_AMBULATORY_CARE_PROVIDER_SITE_OTHER): Payer: Medicare Other | Admitting: *Deleted

## 2017-06-01 DIAGNOSIS — I4821 Permanent atrial fibrillation: Secondary | ICD-10-CM

## 2017-06-01 DIAGNOSIS — I482 Chronic atrial fibrillation: Secondary | ICD-10-CM

## 2017-06-02 ENCOUNTER — Telehealth: Payer: Self-pay | Admitting: Family

## 2017-06-02 ENCOUNTER — Ambulatory Visit (INDEPENDENT_AMBULATORY_CARE_PROVIDER_SITE_OTHER): Payer: Medicare Other | Admitting: Family Medicine

## 2017-06-02 VITALS — BP 101/70 | HR 86 | Temp 96.7°F | Ht 67.0 in | Wt 254.0 lb

## 2017-06-02 DIAGNOSIS — L508 Other urticaria: Secondary | ICD-10-CM | POA: Diagnosis not present

## 2017-06-02 DIAGNOSIS — R300 Dysuria: Secondary | ICD-10-CM | POA: Diagnosis not present

## 2017-06-02 DIAGNOSIS — N3001 Acute cystitis with hematuria: Secondary | ICD-10-CM

## 2017-06-02 LAB — URINALYSIS, COMPLETE
Bilirubin, UA: NEGATIVE
Glucose, UA: NEGATIVE
Ketones, UA: NEGATIVE
Nitrite, UA: NEGATIVE
Protein, UA: NEGATIVE
Specific Gravity, UA: 1.01 (ref 1.005–1.030)
Urobilinogen, Ur: 0.2 mg/dL (ref 0.2–1.0)
pH, UA: 7 (ref 5.0–7.5)

## 2017-06-02 LAB — MICROSCOPIC EXAMINATION
Renal Epithel, UA: NONE SEEN /hpf
WBC, UA: >30 /hpf — AB (ref 0–?)

## 2017-06-02 MED ORDER — CEFDINIR 300 MG PO CAPS: 300.0000 mg | ORAL_CAPSULE | Freq: Two times a day (BID) | ORAL | 0 refills | Status: AC

## 2017-06-02 MED ORDER — HYDROCORTISONE 2.5 % EX CREA: TOPICAL_CREAM | CUTANEOUS | 0 refills | Status: DC

## 2017-06-02 NOTE — Patient Instructions (Signed)
Your urine does show infection.  I have sent this for culture.  I will contact you with the results.  I prescribed you Cefdinir 300 mg to take twice a day for the next week.  I have also prescribed you hydrocortisone cream to apply to your neck twice a day for the next week.  If your symptoms do not get better, please follow-up with [facility] for reevaluation.   Urinary Tract Infection, Adult A urinary tract infection (UTI) is an infection of any part of the urinary tract. The urinary tract includes the:  Kidneys.  Ureters.  Bladder.  Urethra.  These organs make, store, and get rid of pee (urine) in the body. Follow these instructions at home:  Take over-the-counter and prescription medicines only as told by your doctor.  If you were prescribed an antibiotic medicine, take it as told by your doctor. Do not stop taking the antibiotic even if you start to feel better.  Avoid the following drinks: ? Alcohol. ? Caffeine. ? Tea. ? Carbonated drinks.  Drink enough fluid to keep your pee clear or pale yellow.  Keep all follow-up visits as told by your doctor. This is important.  Make sure to: ? Empty your bladder often and completely. Do not to hold pee for long periods of time. ? Empty your bladder before and after sex. ? Wipe from front to back after a bowel movement if you are female. Use each tissue one time when you wipe. Contact a doctor if:  You have back pain.  You have a fever.  You feel sick to your stomach (nauseous).  You throw up (vomit).  Your symptoms do not get better after 3 days.  Your symptoms go away and then come back. Get help right away if:  You have very bad back pain.  You have very bad lower belly (abdominal) pain.  You are throwing up and cannot keep down any medicines or water. This information is not intended to replace advice given to you by your health care provider. Make sure you discuss any questions you have with your health care  provider. Document Released: 09/24/2007 Document Revised: 09/13/2015 Document Reviewed: 02/26/2015 Elsevier Interactive Patient Education  [employer].

## 2017-06-02 NOTE — Telephone Encounter (Signed)
What symptoms do you have? Bladder infection  How long have you been sick? This morning  Have you been seen for this problem? no  If your provider decides to give you a prescription, which pharmacy would you like for it to be sent to? [facility]   Patient informed that this information will be sent to the clinical staff for review and that they should receive a follow up call.

## 2017-06-02 NOTE — Telephone Encounter (Signed)
Lmtcb - patient ntbs

## 2017-06-02 NOTE — Progress Notes (Signed)
Subjective: CC: urinary symptoms PCP: Sharion Balloon, FNP BOF:BPZW E Wages is a 77 y.o. female presenting to clinic today for:  1. Urinary symptoms Patient reports a 1 day h/o dysuria.  She reports urinary frequency with Lasix but otherwise urine output has been baseline.  Denies urgency, hematuria, fevers, chills, abdominal pain, nausea, vomiting, back pain, vaginal discharge.  Patient has used Premarin cream for symptoms.  Patient denies a h/o frequent or recurrent UTIs.    Last urinary tract infection was November 2018.  This grew E. Coli, which was multidrug resistant but sensitive to Macrobid and some cephalosporins.  2. Hives Patient reports chronic urticaria.  She notes that she is on Xyzal daily.  She was previously on Xolair injections but notes that her insurance stopped paying for this.  She notes that the hives have actually been pretty well controlled on Xyzal until about a month ago.  She reports that they occur in various places and are itchy.  Currently, she has an area along the right side of her neck that is itchy.  She has not done anything except for take Xyzal for this issue.  No new perfumes, medications, lotions, soaps, foods or recent travel.   ROS: Per HPI  Allergies  Allergen Reactions  . Allopurinol Swelling    Feet and legs swell  . Mobic [Meloxicam] Swelling and Other (See Comments)    Feet and Leg swelling   Past Medical History:  Diagnosis Date  . Acute diastolic CHF (congestive heart failure) ([place]) 11/11/2016  . Allergic rhinitis   . Anxiety   . Anxiety   . Arthritis    "~ all my joints" (05/18/2017)  . Atrial fibrillation ([place]) 09/2016  . Atrial fibrillation with RVR ([place]) 05/18/2017  . Chronic lower back pain   . COPD (chronic obstructive pulmonary disease) ([place])   . Depression   . Diverticulosis   . Dyspnea   . Dysrhythmia   . GERD (gastroesophageal reflux disease)   . Gout   . History of blood transfusion 1983   "when I gave a kidney  to my sister"  . Hyperlipidemia   . Hypertension   . Hypothyroidism   . Macular degeneration of both eyes   . Neuropathy   . On home oxygen therapy    "3L; 24/7" (05/18/2017)  . Pre-diabetes   . 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 Pulmonary fibrosis ([place])   . Rheumatoid arthritis ([place])   . Sleep apnea    uses 3 liters of o2 24/7  . Urticaria     Current Outpatient Medications:  .  albuterol (PROVENTIL) (2.5 MG/3ML) 0.083% nebulizer solution, Take 3 mLs (2.5 mg total) by nebulization every 6 (six) hours as needed for wheezing or shortness of breath. J96.01, Disp: 150 mL, Rfl: 1 .  apixaban (ELIQUIS) 5 MG TABS tablet, Take 1 tablet (5 mg total) by mouth 2 (two) times daily. Resume 05/23/17, Disp: 60 tablet, Rfl: 5 .  azelastine (ASTELIN) 0.1 % nasal spray, PLACE 2 SPRAYS INTO BOTH NOSTRILS 2 (TWO) TIMES DAILY AS NEEDED., Disp: 30 mL, Rfl: 5 .  calcium citrate (CALCITRATE - DOSED IN MG ELEMENTAL CALCIUM) 950 MG tablet, Take 1 tablet by mouth 2 (two) times daily. , Disp: , Rfl:  .  Cholecalciferol 2000 units CAPS, Take 2,000 Units by mouth daily. , Disp: , Rfl:  .  conjugated estrogens (PREMARIN) vaginal cream, Place 0.5 g vaginally as needed. Weekly prn.  Use as directed, Disp: , Rfl:  .  cyanocobalamin 1000 MCG tablet, Take 1,000 mcg by mouth daily. , Disp: , Rfl:  .  DEXILANT 60 MG capsule, Take 60 mg by mouth daily., Disp: , Rfl: 5 .  diazepam (VALIUM) 5 MG tablet, TAKE 1 TABLET BY MOUTH TWICE A DAY AS NEEDED FOR ANXIETY, Disp: 60 tablet, Rfl: 0 .  diltiazem (CARDIZEM CD) 180 MG 24 hr capsule, TAKE 1 CAPSULE BY MOUTH TWICE A DAY, Disp: 60 capsule, Rfl: 5 .  docusate sodium (COLACE) 100 MG capsule, Take 200 mg by mouth daily as needed for mild constipation., Disp: , Rfl:  .  EPINEPHrine 0.3 mg/0.3 mL IJ SOAJ injection, Inject 0.3 mg into the muscle once as needed (For anaphylaxis.)., Disp: , Rfl:  .  FeFum-FePoly-FA-B Cmp-C-Biot (FOLIVANE-PLUS) CAPS, Take 1 capsule by  mouth 2 (two) times daily., Disp: , Rfl: 1 .  ferrous sulfate 325 (65 FE) MG tablet, Take 325 mg by mouth 2 (two) times daily with a meal., Disp: , Rfl:  .  fluticasone (FLONASE) 50 MCG/ACT nasal spray, PLACE 1 SPRAY INTO BOTH NOSTRILS 2 (TWO) TIMES DAILY AS NEEDED FOR ALLERGIES OR RHINITIS. (Patient taking differently: Place 1 spray into both nostrils daily. ), Disp: 16 g, Rfl: 5 .  furosemide (LASIX) 40 MG tablet, Take 40 mg ( 1 Tablet) Alternating with 60 mg ( 1 1/2 Tablet) Daily (Patient taking differently: Take 40-60 mg by mouth See admin instructions. Take 40 mg ( 1 Tablet) Alternating with 60 mg ( 1 1/2 Tablet) Daily), Disp: 90 tablet, Rfl: 6 .  INCRUSE ELLIPTA 62.5 MCG/INH AEPB, TAKE 1 PUFF BY MOUTH EVERY DAY, Disp: , Rfl: 3 .  levocetirizine (XYZAL) 5 MG tablet, Take 5 mg by mouth every evening. , Disp: , Rfl:  .  levothyroxine (SYNTHROID, LEVOTHROID) 100 MCG tablet, TAKE 1 TABLET (100 MCG TOTAL) BY MOUTH DAILY BEFORE BREAKFAST., Disp: 90 tablet, Rfl: 1 .  metoprolol tartrate (LOPRESSOR) 25 MG tablet, Take 0.5 tablets (12.5 mg total) by mouth 2 (two) times daily., Disp: 60 tablet, Rfl: 1 .  Multiple Vitamins-Minerals (PRESERVISION AREDS 2 PO), Take 1 tablet by mouth 2 (two) times daily., Disp: , Rfl:  .  ondansetron (ZOFRAN) 8 MG tablet, Take 8 mg by mouth every 8 (eight) hours as needed for nausea or vomiting., Disp: , Rfl:  .  OVER THE COUNTER MEDICATION, Place 1 drop into both eyes as needed (For dry eyes.). Systane Gel Drops, Disp: , Rfl:  .  PATADAY 0.2 % SOLN, Place 1 drop into both eyes daily., Disp: , Rfl:  .  polyethylene glycol powder (GLYCOLAX/MIRALAX) powder, TAKE 17 G BY MOUTH 2 (TWO) TIMES DAILY AS NEEDED., Disp: 3162 g, Rfl: 0 .  predniSONE (DELTASONE) 10 MG tablet, Take 10 mg by mouth daily with breakfast., Disp: , Rfl:  .  rosuvastatin (CRESTOR) 10 MG tablet, TAKE 1 TABLET BY MOUTH AT BEDTIME, Disp: 90 tablet, Rfl: 0 .  spironolactone (ALDACTONE) 25 MG tablet, TAKE 1 TABLET  BY MOUTH EVERY DAY, Disp: 90 tablet, Rfl: 0 .  traMADol (ULTRAM) 50 MG tablet, TAKE 1 TABLET BY MOUTH EVERY 6 HOURS AS NEEDED (Patient taking differently: TAKE 1 TABLET BY MOUTH EVERY 6 HOURS AS NEEDED FOR PAIN), Disp: 90 tablet, Rfl: 2 .  VIGAMOX 0.5 % ophthalmic solution, Apply 1 drop to eye as needed (after eye injection). , Disp: , Rfl: 12 .  vitamin C (ASCORBIC ACID) 500 MG tablet, Take 1,000 mg by mouth daily. , Disp: , Rfl:  .  Vitamin  D, Ergocalciferol, (DRISDOL) 50000 units CAPS capsule, TAKE 1 CAPSULE (50,000 UNITS TOTAL) BY MOUTH EVERY 7 (SEVEN) DAYS., Disp: 12 capsule, Rfl: 0 .  vitamin E 400 UNIT capsule, Take 400 Units by mouth daily., Disp: , Rfl:  Social History   Socioeconomic History  . Marital status: Married    Spouse name: Not on file  . Number of children: 4  . Years of education: Not on file  . Highest education level: Not on file  Social Needs  . Financial resource strain: Not on file  . Food insecurity - worry: Not on file  . Food insecurity - inability: Not on file  . Transportation needs - medical: Not on file  . Transportation needs - non-medical: Not on file  Occupational History  . Occupation: retired    Comment: cna  Tobacco Use  . Smoking status: Never Smoker  . Smokeless tobacco: Never Used  Substance and Sexual Activity  . Alcohol use: No  . Drug use: No  . Sexual activity: Not on file  Other Topics Concern  . 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   Family History  Problem Relation Age of Onset  . Diabetes Unknown   . Hypertension Unknown   . Coronary artery disease Unknown   . Stroke Unknown   . Asthma Unknown   . Kidney disease Sister   . Lung cancer Sister   . Lung cancer Sister   . Suicidality Son        committed suicide in 2009  . Hyperlipidemia Unknown   . Anxiety disorder Unknown   . Migraines Unknown     Objective: Office vital signs reviewed. BP  101/70   Pulse 86   Temp (!) 96.7 F (35.9 C) (Oral)   Ht 5\' 7"  (1.702 m)   Wt 254 lb (115.2 kg)   SpO2 96%   BMI 39.78 kg/m   Physical Examination:  General: Awake, alert, chronically ill appearing female, No acute distress Pulm: normal work of breathing on 3L GU: No suprapubic tenderness or CVA tenderness to palpation Skin: Large will appreciated along the right side of the base of her neck.  This is nontender to palpation.  No skin breakdown or evidence of secondary infection appreciated.  Assessment/ Plan: 77 y.o. female   1. Acute cystitis with hematuria Urinalysis with 2+ leukocytes and trace blood.  Urine microscopy with greater than 30 white blood cells, 3-10 red blood cells and few bacteria.  This has been sent for urine culture.  Her last urine culture was reviewed and E. coli was sensitive to third generation cephalosporins.  Omnicef 300 mg p.o. twice daily for 7 days prescribed.  Home care instructions were reviewed with the patient.  Reasons for emergent evaluation discussed.  She voiced good understanding will follow-up as needed. - Urinalysis, Complete - Urine Culture  2. Chronic urticaria She is actually already on prednisone for chronic pulmonary issues.  She is also on an oral antihistamine.  She is on multiple other medications, including blood thinners.  For this reason, I have elected to add a topical steroid for as needed use.  Instructions for use were reviewed with the patient.  Avoid face.  Should she have persistent issues, I did recommend that she follow-up with PCP for reevaluation and likely referral to allergy in this complicated patient.   Orders Placed This Encounter  Procedures  . Urine Culture  .  Urinalysis, Complete   Meds ordered this encounter  Medications  . cefdinir (OMNICEF) 300 MG capsule    Sig: Take 1 capsule (300 mg total) by mouth 2 (two) times daily for 7 days.    Dispense:  14 capsule    Refill:  0  . hydrocortisone 2.5 % cream     Sig: Applied to affected areas 2 times per day for 1 week.  Do not apply to face.    Dispense:  30 g    Refill:  [place], DO [place] [phone]

## 2017-06-02 NOTE — Telephone Encounter (Signed)
Patient has a follow up appointment scheduled. 

## 2017-06-03 DIAGNOSIS — I4891 Unspecified atrial fibrillation: Secondary | ICD-10-CM | POA: Diagnosis not present

## 2017-06-03 DIAGNOSIS — I130 Hypertensive heart and chronic kidney disease with heart failure and stage 1 through stage 4 chronic kidney disease, or unspecified chronic kidney disease: Secondary | ICD-10-CM | POA: Diagnosis not present

## 2017-06-03 DIAGNOSIS — E1142 Type 2 diabetes mellitus with diabetic polyneuropathy: Secondary | ICD-10-CM | POA: Diagnosis not present

## 2017-06-03 DIAGNOSIS — J9611 Chronic respiratory failure with hypoxia: Secondary | ICD-10-CM | POA: Diagnosis not present

## 2017-06-03 DIAGNOSIS — I5031 Acute diastolic (congestive) heart failure: Secondary | ICD-10-CM | POA: Diagnosis not present

## 2017-06-03 DIAGNOSIS — J8410 Pulmonary fibrosis, unspecified: Secondary | ICD-10-CM | POA: Diagnosis not present

## 2017-06-04 LAB — CUP PACEART INCLINIC DEVICE CHECK
Battery Remaining Longevity: 102 mo
Battery Voltage: 3.19 V
Brady Statistic AP VP Percent: 35.33 %
Brady Statistic AP VS Percent: 0 %
Brady Statistic AS VP Percent: 0.01 %
Brady Statistic AS VS Percent: 64.66 %
Brady Statistic RA Percent Paced: 78.84 %
Brady Statistic RV Percent Paced: 35.34 %
Date Time Interrogation Session: 20190211191222
Implantable Lead Implant Date: 20190128
Implantable Lead Implant Date: 20190128
Implantable Lead Location: 753860
Implantable Lead Location: 753860
Implantable Lead Model: 3830
Implantable Lead Model: 5076
Implantable Pulse Generator Implant Date: 20190128
Lead Channel Impedance Value: 323 Ohm
Lead Channel Impedance Value: 380 Ohm
Lead Channel Impedance Value: 418 Ohm
Lead Channel Impedance Value: 437 Ohm
Lead Channel Pacing Threshold Amplitude: 0.75 V
Lead Channel Pacing Threshold Amplitude: 2.75 V
Lead Channel Pacing Threshold Pulse Width: 0.4 ms
Lead Channel Pacing Threshold Pulse Width: 1 ms
Lead Channel Sensing Intrinsic Amplitude: 11.75 mV
Lead Channel Sensing Intrinsic Amplitude: 2.25 mV
Lead Channel Setting Pacing Amplitude: 3.5 V
Lead Channel Setting Pacing Amplitude: 5 V
Lead Channel Setting Pacing Pulse Width: 0.4 ms
Lead Channel Setting Sensing Sensitivity: 0.9 mV

## 2017-06-04 NOTE — Progress Notes (Signed)
Wound check appointment. Steri-strips removed. Wound without redness or edema. Incision edges approximated, wound well healed. Normal device function. Thresholds, sensing, and impedances consistent with implant measurements. Appears Non-selective His bundle capture 5 to loss of capture. Device programmed RV at 3.5V and RA (His bundle) at 5.0V for extra safety margin until 3 month visit. Histogram distribution appropriate for patient and level of activity. No mode switches or high ventricular rates noted. Patient educated about wound care, arm mobility, lifting restrictions. ROV 08/20/17 with GT.

## 2017-06-04 NOTE — Telephone Encounter (Signed)
Clarified order with patient

## 2017-06-05 LAB — URINE CULTURE

## 2017-06-08 ENCOUNTER — Other Ambulatory Visit: Payer: Self-pay | Admitting: Family

## 2017-06-11 ENCOUNTER — Encounter: Payer: Self-pay | Admitting: Internal Medicine

## 2017-06-11 DIAGNOSIS — I130 Hypertensive heart and chronic kidney disease with heart failure and stage 1 through stage 4 chronic kidney disease, or unspecified chronic kidney disease: Secondary | ICD-10-CM | POA: Diagnosis not present

## 2017-06-11 DIAGNOSIS — E1142 Type 2 diabetes mellitus with diabetic polyneuropathy: Secondary | ICD-10-CM | POA: Diagnosis not present

## 2017-06-11 DIAGNOSIS — I5031 Acute diastolic (congestive) heart failure: Secondary | ICD-10-CM | POA: Diagnosis not present

## 2017-06-11 DIAGNOSIS — I4891 Unspecified atrial fibrillation: Secondary | ICD-10-CM | POA: Diagnosis not present

## 2017-06-11 DIAGNOSIS — J8410 Pulmonary fibrosis, unspecified: Secondary | ICD-10-CM | POA: Diagnosis not present

## 2017-06-11 DIAGNOSIS — J9611 Chronic respiratory failure with hypoxia: Secondary | ICD-10-CM | POA: Diagnosis not present

## 2017-06-14 ENCOUNTER — Other Ambulatory Visit: Payer: Self-pay | Admitting: Family

## 2017-06-15 DIAGNOSIS — I130 Hypertensive heart and chronic kidney disease with heart failure and stage 1 through stage 4 chronic kidney disease, or unspecified chronic kidney disease: Secondary | ICD-10-CM | POA: Diagnosis not present

## 2017-06-15 DIAGNOSIS — E1142 Type 2 diabetes mellitus with diabetic polyneuropathy: Secondary | ICD-10-CM | POA: Diagnosis not present

## 2017-06-15 DIAGNOSIS — I4891 Unspecified atrial fibrillation: Secondary | ICD-10-CM | POA: Diagnosis not present

## 2017-06-15 DIAGNOSIS — I5031 Acute diastolic (congestive) heart failure: Secondary | ICD-10-CM | POA: Diagnosis not present

## 2017-06-15 DIAGNOSIS — J8410 Pulmonary fibrosis, unspecified: Secondary | ICD-10-CM | POA: Diagnosis not present

## 2017-06-15 DIAGNOSIS — J9611 Chronic respiratory failure with hypoxia: Secondary | ICD-10-CM | POA: Diagnosis not present

## 2017-06-15 NOTE — Telephone Encounter (Signed)
Last vIT d 10/19/15  26.8

## 2017-06-16 ENCOUNTER — Ambulatory Visit (INDEPENDENT_AMBULATORY_CARE_PROVIDER_SITE_OTHER): Payer: Medicare Other | Admitting: *Deleted

## 2017-06-16 ENCOUNTER — Encounter: Payer: Self-pay | Admitting: *Deleted

## 2017-06-16 VITALS — BP 105/59 | HR 87 | Ht 67.0 in | Wt 258.0 lb

## 2017-06-16 DIAGNOSIS — Z0000 Encounter for general adult medical examination without abnormal findings: Secondary | ICD-10-CM

## 2017-06-16 NOTE — Progress Notes (Addendum)
Subjective:   Lorraine Leonard is a 77 y.o. female who presents for an Initial Medicare Annual Wellness Visit. Mrs Mabee has been married for 60 years. She lives in a single wide with her husband and has steps onto the porch with railings. She could use a ramp. Her son lives behind them and her granddaughter, that is a Marine scientist, lives nearby and helps manage her medications.  They have 4 children, 13 grandkids, 4 children, and 13 great grandchildren.  Review of Systems    Health is worse than last year Afib and COPD  Hospitalized several times  Cardiac Risk Factors include: advanced age (>48men, >46 women);obesity (BMI >30kg/m2);sedentary lifestyle;hypertension;dyslipidemia     Objective:    Today's Vitals   06/16/17 1509  BP: (!) 105/59  Pulse: 87  Weight: 258 lb (117 kg)  Height: 5\' 7"  (1.702 m)   Body mass index is 40.41 kg/m.  Advanced Directives 06/16/2017 05/18/2017 04/09/2017 02/10/2017 02/09/2017 01/30/2017 01/27/2017  Does Patient Have a Medical Advance Directive? No No No - No No No  Would patient like information on creating a medical advance directive? Yes (MAU/Ambulatory/Procedural Areas - Information given) No - Patient declined No - Patient declined No - Patient declined - No - Patient declined No - Patient declined    Current Medications (verified) Outpatient Encounter Medications as of 06/16/2017  Medication Sig  . albuterol (PROVENTIL) (2.5 MG/3ML) 0.083% nebulizer solution Take 3 mLs (2.5 mg total) by nebulization every 6 (six) hours as needed for wheezing or shortness of breath. J96.01 (Patient not taking: Reported on 06/18/2017)  . apixaban (ELIQUIS) 5 MG TABS tablet Take 1 tablet (5 mg total) by mouth 2 (two) times daily. Resume 05/23/17  . azelastine (ASTELIN) 0.1 % nasal spray PLACE 2 SPRAYS INTO BOTH NOSTRILS 2 (TWO) TIMES DAILY AS NEEDED.  . calcium citrate (CALCITRATE - DOSED IN MG ELEMENTAL CALCIUM) 950 MG tablet Take 1 tablet by mouth 2 (two) times daily.   .  Cholecalciferol 2000 units CAPS Take 2,000 Units by mouth daily.   Marland Kitchen conjugated estrogens (PREMARIN) vaginal cream Place 0.5 g vaginally as needed. Weekly prn.  Use as directed  . cyanocobalamin 1000 MCG tablet Take 1,000 mcg by mouth daily.   Marland Kitchen DEXILANT 60 MG capsule Take 60 mg by mouth daily.  Marland Kitchen diltiazem (CARDIZEM CD) 180 MG 24 hr capsule TAKE 1 CAPSULE BY MOUTH TWICE A DAY  . docusate sodium (COLACE) 100 MG capsule Take 200 mg by mouth daily as needed for mild constipation.  Marland Kitchen EPINEPHrine 0.3 mg/0.3 mL IJ SOAJ injection Inject 0.3 mg into the muscle once as needed (For anaphylaxis.).  Marland Kitchen FeFum-FePoly-FA-B Cmp-C-Biot (FOLIVANE-PLUS) CAPS Take 1 capsule by mouth 2 (two) times daily.  . ferrous sulfate 325 (65 FE) MG tablet Take 325 mg by mouth 2 (two) times daily with a meal.  . fluticasone (FLONASE) 50 MCG/ACT nasal spray PLACE 1 SPRAY INTO BOTH NOSTRILS 2 (TWO) TIMES DAILY AS NEEDED FOR ALLERGIES OR RHINITIS. (Patient taking differently: Place 1 spray into both nostrils daily. )  . furosemide (LASIX) 40 MG tablet Take 40 mg ( 1 Tablet) Alternating with 60 mg ( 1 1/2 Tablet) Daily (Patient taking differently: Take 40-60 mg by mouth See admin instructions. Take 40 mg ( 1 Tablet) Alternating with 60 mg ( 1 1/2 Tablet) Daily)  . hydrocortisone 2.5 % cream Applied to affected areas 2 times per day for 1 week.  Do not apply to face.  . INCRUSE ELLIPTA 62.5  MCG/INH AEPB TAKE 1 PUFF BY MOUTH EVERY DAY  . levocetirizine (XYZAL) 5 MG tablet Take 5 mg by mouth every evening.   Marland Kitchen levothyroxine (SYNTHROID, LEVOTHROID) 100 MCG tablet TAKE 1 TABLET (100 MCG TOTAL) BY MOUTH DAILY BEFORE BREAKFAST.  . metoprolol tartrate (LOPRESSOR) 25 MG tablet Take 0.5 tablets (12.5 mg total) by mouth 2 (two) times daily.  . ondansetron (ZOFRAN) 8 MG tablet Take 8 mg by mouth every 8 (eight) hours as needed for nausea or vomiting.  Marland Kitchen OVER THE COUNTER MEDICATION Place 1 drop into both eyes as needed (For dry eyes.). Systane Gel  Drops  . PATADAY 0.2 % SOLN Place 1 drop into both eyes daily.  . polyethylene glycol powder (GLYCOLAX/MIRALAX) powder TAKE 17 G BY MOUTH 2 (TWO) TIMES DAILY AS NEEDED.  Marland Kitchen predniSONE (DELTASONE) 10 MG tablet Take 10 mg by mouth daily with breakfast.  . rosuvastatin (CRESTOR) 10 MG tablet TAKE 1 TABLET BY MOUTH EVERYDAY AT BEDTIME  . spironolactone (ALDACTONE) 25 MG tablet TAKE 1 TABLET BY MOUTH EVERY DAY  . traMADol (ULTRAM) 50 MG tablet TAKE 1 TABLET BY MOUTH EVERY 6 HOURS AS NEEDED (Patient taking differently: TAKE 1 TABLET BY MOUTH EVERY 6 HOURS AS NEEDED FOR PAIN)  . VIGAMOX 0.5 % ophthalmic solution Apply 1 drop to eye as needed (after eye injection).   . vitamin C (ASCORBIC ACID) 500 MG tablet Take 1,000 mg by mouth daily.   . Vitamin D, Ergocalciferol, (DRISDOL) 50000 units CAPS capsule TAKE 1 CAPSULE (50,000 UNITS TOTAL) BY MOUTH EVERY 7 (SEVEN) DAYS.  Marland Kitchen vitamin E 400 UNIT capsule Take 400 Units by mouth daily.  . [DISCONTINUED] diazepam (VALIUM) 5 MG tablet TAKE 1 TABLET BY MOUTH TWICE A DAY AS NEEDED FOR ANXIETY  . [DISCONTINUED] Multiple Vitamins-Minerals (PRESERVISION AREDS 2 PO) Take 1 tablet by mouth 2 (two) times daily.   No facility-administered encounter medications on file as of 06/16/2017.     Allergies (verified) Allopurinol and Mobic [meloxicam]   History: Past Medical History:  Diagnosis Date  . Acute diastolic CHF (congestive heart failure) ([place]) 11/11/2016  . Allergic rhinitis   . Anxiety   . Anxiety   . Arthritis    "~ all my joints" (05/18/2017)  . Atrial fibrillation ([place]) 09/2016  . Atrial fibrillation with RVR ([place]) 05/18/2017  . Chronic lower back pain   . COPD (chronic obstructive pulmonary disease) ([place])   . Depression   . Diverticulosis   . Dyspnea   . Dysrhythmia   . GERD (gastroesophageal reflux disease)   . Gout   . 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 Hyperlipidemia   . Hypertension   . Hypothyroidism    . Macular degeneration of both eyes   . Neuropathy   . On home oxygen therapy    "3L; 24/7" (05/18/2017)  . Pre-diabetes   . 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 Pulmonary fibrosis ([place])   . Rheumatoid arthritis ([place])   . Sleep apnea    uses 3 liters of o2 24/7  . Urticaria    Past Surgical History:  Procedure Laterality Date  . AV NODE ABLATION  05/18/2017  . AV NODE ABLATION N/A 05/18/2017   Procedure: AV NODE ABLATION;  Surgeon: Evans Lance, MD;  Location: [facility];  Service: Cardiovascular;  Laterality: N/A;  . BACK SURGERY    . BREAST LUMPECTOMY Left   . CARDIOVERSION N/A 01/30/2017  Procedure: CARDIOVERSION;  Surgeon: Josue Hector, MD;  [facility];  Service: Cardiovascular;  Laterality: N/A;  . CARDIOVERSION N/A 04/09/2017   Procedure: CARDIOVERSION;  Surgeon: Satira Sark, MD;  Location: [facility];  Service: Cardiovascular;  Laterality: N/A;  . CATARACT EXTRACTION W/ INTRAOCULAR LENS  IMPLANT, BILATERAL Bilateral   . DILATION AND CURETTAGE OF UTERUS    . EUS N/A 10/03/2015   Procedure: UPPER ENDOSCOPIC ULTRASOUND (EUS) RADIAL;  Surgeon: Arta Silence, MD;  Location: [facility];  Service: Endoscopy;  Laterality: N/A;  . FINE NEEDLE ASPIRATION N/A 10/03/2015   Procedure: FINE NEEDLE ASPIRATION (FNA) RADIAL;  Surgeon: Arta Silence, MD;  Location: [facility];  Service: Endoscopy;  Laterality: N/A;  . INSERT / REPLACE / REMOVE PACEMAKER  05/18/2017  . LUMBAR LAMINECTOMY  1995; 06/23/2008   L4-5/notes 08/20/2010  . [place]   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  . PACEMAKER IMPLANT N/A 05/18/2017   Procedure: PACEMAKER IMPLANT;  Surgeon: Evans Lance, MD;  Location: [facility];  Service: Cardiovascular;  Laterality: N/A;  . TOTAL ABDOMINAL HYSTERECTOMY    . TUBAL LIGATION     Family History  Problem Relation Age of Onset  . Diabetes Unknown   . Hypertension Unknown   . Coronary artery  disease Unknown   . Stroke Unknown   . Asthma Unknown   . Kidney disease Sister   . Cancer Sister        lung  . Lung cancer Sister   . Cancer Sister        lung  . Lung cancer Sister   . Cancer Sister        lung  . Suicidality Son        committed suicide in 2009  . CVA Son   . Hyperlipidemia Unknown   . Anxiety disorder Unknown   . Migraines Unknown   . Heart failure Father   . Healthy Brother   . Cancer Sister        lung  . Healthy Sister    Social History   Socioeconomic History  . Marital status: Married    Spouse name: Not on file  . Number of children: 4  . Years of education: Not on file  . Highest education level: Not on file  Occupational History  . Occupation: retired    Comment: cna  Social Needs  . Financial resource strain: Not hard at all  . Food insecurity:    Worry: Never true    Inability: Never true  . Transportation needs:    Medical: No    Non-medical: No  Tobacco Use  . Smoking status: Never Smoker  . Smokeless tobacco: Never Used  Substance and Sexual Activity  . Alcohol use: No  . Drug use: No  . Sexual activity: Not Currently    Birth control/protection: Surgical  Lifestyle  . Physical activity:    Days per week: 0 days    Minutes per session: 0 min  . Stress: Rather much  Relationships  . Social connections:    Talks on phone: More than three times a week    Gets together: More than three times a week    Attends religious service: Never    Active member of club or organization: No    Attends meetings of clubs or organizations: Never    Relationship status: Married  Other Topics Concern  . 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   Pt lives with her husband in a single wide trailer. They have 4 children and 13 grandchildren.     Tobacco Counseling No tobacco use  Clinical Intake:    Pain : No/denies pain     Nutritional Status: BMI > 30   Obese Diabetes: No  How often do you need to have someone help you when you read instructions, pamphlets, or other written materials from your doctor or pharmacy?: 1 - Never What is the last grade level you completed in school?: 9th  Interpreter Needed?: No  Information entered by :: Chong Sicilian, RN   Activities of Daily Living In your present state of health, do you have any difficulty performing the following activities: 06/16/2017 05/18/2017  Hearing? Y -  Comment trouble with hearing in right ear -  Vision? Y -  Comment Sees Dr Zigmund Daniel every few months -  Difficulty concentrating or making decisions? (No Data) -  Comment has noticed some decrease -  Walking or climbing stairs? Y -  Dressing or bathing? Y -  Doing errands, shopping? Tempie Donning  Preparing Food and eating ? Y -  Using the Toilet? Y -  In the past six months, have you accidently leaked urine? N -  Do you have problems with loss of bowel control? N -  Managing your Medications? Y -  Comment grandaughters -  Managing your Finances? N -  Some recent data might be hidden     Immunizations and Health Maintenance Immunization History  Administered Date(s) Administered  . Influenza Split 01/20/2011, 12/22/2011  . Influenza, High Dose Seasonal PF 01/25/2014, 01/24/2015, 01/01/2016  . Influenza-Unspecified 01/19/2013  . Pneumococcal Conjugate-13 03/01/2015  . Pneumococcal Polysaccharide-23 04/21/2009  . Zoster 10/20/2010  . Zoster Recombinat (Shingrix) 09/03/2016   Health Maintenance Due  Topic Date Due  . TETANUS/TDAP  07/10/1959  . FOOT EXAM  04/18/2017  . URINE MICROALBUMIN  04/18/2017    Patient Care Team: Sharion Balloon, FNP as PCP - General (Family Medicine) Collene Gobble, MD as Consulting Physician (Pulmonary Disease)  Several hospitalizations over the past year.      Assessment:   This is a routine wellness examination for [place].  Hearing/Vision screen No deficits noted during visit. Patient  has macular degeneration and regular eye exams.   Dietary issues and exercise activities discussed:  Diet Tends to eat for comfort   Current Exercise Habits: The patient does not participate in regular exercise at present, Exercise limited by: orthopedic condition(s);respiratory conditions(s)  Goals    . Exercise 150 min/wk Moderate Activity     Chair exercises daily. Handout given.       Depression Screen PHQ 2/9 Scores 06/16/2017 05/14/2017 03/24/2017 02/28/2017 12/25/2016 11/10/2016 10/06/2016  PHQ - 2 Score 3 0 0 1 5 1  0  PHQ- 9 Score 10 - - - 23 - -    Fall Risk Fall Risk  06/16/2017 05/14/2017 03/24/2017 02/28/2017 12/25/2016  Falls in the past year? Yes Yes Yes Yes No  Number falls in past yr: 1 1 1 1  -  Injury with Fall? No No No No -  Comment fell off of a stool with no injury Fell off stool with no injury. - - -  Risk for fall due to : History of fall(s) - - - -    Is the patient's home free of loose throw rugs in walkways, pet beds, electrical cords,  etc?   yes      Grab bars in the bathroom? no. Needs a larger shower with grab bars and a handicap toilet      Handrails on the stairs?   yes      Adequate lighting?   yes   Cognitive Function: MMSE - Mini Mental State Exam 06/16/2017  Orientation to time 5  Orientation to Place 5  Registration 3  Attention/ Calculation 5  Recall 3  Language- name 2 objects 2  Language- repeat 1  Language- follow 3 step command 3  Language- read & follow direction 1  Write a sentence 1  Copy design 1  Total score 30        Screening Tests Health Maintenance  Topic Date Due  . TETANUS/TDAP  07[birth year]10/1959  . FOOT EXAM  04/18/2017  . URINE MICROALBUMIN  04/18/2017  . INFLUENZA VACCINE  07/24/2017 (Originally 11/19/2016)  . HEMOGLOBIN A1C  11/11/2017  . OPHTHALMOLOGY EXAM  06/25/2018  . DEXA SCAN  Completed  . PNA vac Low Risk Adult  Completed     Plan:  Keep f/u with PCP Advance Directives given. Review with family Increase  activity level slowly but discuss with cardiologist first  I have personally reviewed and noted the following in the patient's chart:   . Medical and social history . Use of alcohol, tobacco or illicit drugs  . Current medications and supplements . Functional ability and status . Nutritional status . Physical activity . Advanced directives . List of other physicians . Hospitalizations, surgeries, and ER visits in previous 12 months . Vitals . Screenings to include cognitive, depression, and falls . Referrals and appointments  In addition, I have reviewed and discussed with patient certain preventive protocols, quality metrics, and best practice recommendations. A written personalized care plan for preventive services as well as general preventive health recommendations were provided to patient.     Chong Sicilian, RN   06/16/2017     I have reviewed and agree with the above AWV documentation.   Evelina Dun, FNP

## 2017-06-16 NOTE — Patient Instructions (Signed)
  Lorraine Leonard , Thank you for taking time to come for your Medicare Wellness Visit. I appreciate your ongoing commitment to your health goals. Please review the following plan we discussed and let me know if I can assist you in the future.   These are the goals we discussed: Increase activity level. Talk with cardiologist about what is appropriate.   This is a list of the screening recommended for you and due dates:  Health Maintenance  Topic Date Due  . Tetanus Vaccine  07[birth year]10/1959  . Complete foot exam   04/18/2017  . Urine Protein Check  04/18/2017  . Flu Shot  07/24/2017*  . Hemoglobin A1C  11/11/2017  . Eye exam for diabetics  01/21/2018  . DEXA scan (bone density measurement)  Completed  . Pneumonia vaccines  Completed  *Topic was postponed. The date shown is not the original due date.

## 2017-06-18 ENCOUNTER — Encounter: Payer: Self-pay | Admitting: Internal Medicine

## 2017-06-18 ENCOUNTER — Ambulatory Visit (INDEPENDENT_AMBULATORY_CARE_PROVIDER_SITE_OTHER): Payer: Medicare Other | Admitting: Internal Medicine

## 2017-06-18 VITALS — BP 128/76 | HR 79 | Ht 67.0 in | Wt 257.6 lb

## 2017-06-18 DIAGNOSIS — I4819 Other persistent atrial fibrillation: Secondary | ICD-10-CM

## 2017-06-18 DIAGNOSIS — Z950 Presence of cardiac pacemaker: Secondary | ICD-10-CM | POA: Diagnosis not present

## 2017-06-18 DIAGNOSIS — I5032 Chronic diastolic (congestive) heart failure: Secondary | ICD-10-CM

## 2017-06-18 DIAGNOSIS — I481 Persistent atrial fibrillation: Secondary | ICD-10-CM

## 2017-06-18 NOTE — Progress Notes (Signed)
HPI Lorraine Leonard returns today for followup of her atrial fib with an RVR. She is a pleasant 77 yo woman with uncontrolled atrial fib who could not take amiodarone and underwent AV node ablation and PPM insertion about a month ago. In the interim, she has been stable. She notes that her HR is much improved although she is tstill fairly weak. No syncope or chest pain.  Allergies  Allergen Reactions  . Allopurinol Swelling    Feet and legs swell  . Mobic [Meloxicam] Swelling and Other (See Comments)    Feet and Leg swelling     Current Outpatient Medications  Medication Sig Dispense Refill  . apixaban (ELIQUIS) 5 MG TABS tablet Take 1 tablet (5 mg total) by mouth 2 (two) times daily. Resume 05/23/17 60 tablet 5  . azelastine (ASTELIN) 0.1 % nasal spray PLACE 2 SPRAYS INTO BOTH NOSTRILS 2 (TWO) TIMES DAILY AS NEEDED. 30 mL 5  . calcium citrate (CALCITRATE - DOSED IN MG ELEMENTAL CALCIUM) 950 MG tablet Take 1 tablet by mouth 2 (two) times daily.     . Cholecalciferol 2000 units CAPS Take 2,000 Units by mouth daily.     Marland Kitchen conjugated estrogens (PREMARIN) vaginal cream Place 0.5 g vaginally as needed. Weekly prn.  Use as directed    . cyanocobalamin 1000 MCG tablet Take 1,000 mcg by mouth daily.     Marland Kitchen DEXILANT 60 MG capsule Take 60 mg by mouth daily.  5  . diazepam (VALIUM) 5 MG tablet TAKE 1 TABLET BY MOUTH TWICE A DAY AS NEEDED FOR ANXIETY 60 tablet 0  . diltiazem (CARDIZEM CD) 180 MG 24 hr capsule TAKE 1 CAPSULE BY MOUTH TWICE A DAY 60 capsule 5  . docusate sodium (COLACE) 100 MG capsule Take 200 mg by mouth daily as needed for mild constipation.    Marland Kitchen EPINEPHrine 0.3 mg/0.3 mL IJ SOAJ injection Inject 0.3 mg into the muscle once as needed (For anaphylaxis.).    Marland Kitchen ferrous sulfate 325 (65 FE) MG tablet Take 325 mg by mouth 2 (two) times daily with a meal.    . fluticasone (FLONASE) 50 MCG/ACT nasal spray PLACE 1 SPRAY INTO BOTH NOSTRILS 2 (TWO) TIMES DAILY AS NEEDED FOR ALLERGIES OR  RHINITIS. (Patient taking differently: Place 1 spray into both nostrils daily. ) 16 g 5  . furosemide (LASIX) 40 MG tablet Take 40 mg ( 1 Tablet) Alternating with 60 mg ( 1 1/2 Tablet) Daily (Patient taking differently: Take 40-60 mg by mouth See admin instructions. Take 40 mg ( 1 Tablet) Alternating with 60 mg ( 1 1/2 Tablet) Daily) 90 tablet 6  . hydrocortisone 2.5 % cream Applied to affected areas 2 times per day for 1 week.  Do not apply to face. 30 g 0  . INCRUSE ELLIPTA 62.5 MCG/INH AEPB TAKE 1 PUFF BY MOUTH EVERY DAY  3  . levocetirizine (XYZAL) 5 MG tablet Take 5 mg by mouth every evening.     Marland Kitchen levothyroxine (SYNTHROID, LEVOTHROID) 100 MCG tablet TAKE 1 TABLET (100 MCG TOTAL) BY MOUTH DAILY BEFORE BREAKFAST. 90 tablet 1  . metoprolol tartrate (LOPRESSOR) 25 MG tablet Take 0.5 tablets (12.5 mg total) by mouth 2 (two) times daily. 60 tablet 1  . ondansetron (ZOFRAN) 8 MG tablet Take 8 mg by mouth every 8 (eight) hours as needed for nausea or vomiting.    Marland Kitchen OVER THE COUNTER MEDICATION Place 1 drop into both eyes as needed (For dry eyes.). Systane  Gel Drops    . PATADAY 0.2 % SOLN Place 1 drop into both eyes daily.    . polyethylene glycol powder (GLYCOLAX/MIRALAX) powder TAKE 17 G BY MOUTH 2 (TWO) TIMES DAILY AS NEEDED. 3162 g 0  . predniSONE (DELTASONE) 10 MG tablet Take 10 mg by mouth daily with breakfast.    . rosuvastatin (CRESTOR) 10 MG tablet TAKE 1 TABLET BY MOUTH EVERYDAY AT BEDTIME 90 tablet 1  . spironolactone (ALDACTONE) 25 MG tablet TAKE 1 TABLET BY MOUTH EVERY DAY 90 tablet 1  . traMADol (ULTRAM) 50 MG tablet TAKE 1 TABLET BY MOUTH EVERY 6 HOURS AS NEEDED (Patient taking differently: TAKE 1 TABLET BY MOUTH EVERY 6 HOURS AS NEEDED FOR PAIN) 90 tablet 2  . VIGAMOX 0.5 % ophthalmic solution Apply 1 drop to eye as needed (after eye injection).   12  . vitamin C (ASCORBIC ACID) 500 MG tablet Take 1,000 mg by mouth daily.     . Vitamin D, Ergocalciferol, (DRISDOL) 50000 units CAPS  capsule TAKE 1 CAPSULE (50,000 UNITS TOTAL) BY MOUTH EVERY 7 (SEVEN) DAYS. 12 capsule 0  . vitamin E 400 UNIT capsule Take 400 Units by mouth daily.    Marland Kitchen albuterol (PROVENTIL) (2.5 MG/3ML) 0.083% nebulizer solution Take 3 mLs (2.5 mg total) by nebulization every 6 (six) hours as needed for wheezing or shortness of breath. J96.01 (Patient not taking: Reported on 06/18/2017) 150 mL 1  . FeFum-FePoly-FA-B Cmp-C-Biot (FOLIVANE-PLUS) CAPS Take 1 capsule by mouth 2 (two) times daily.  1   No current facility-administered medications for this visit.      Past Medical History:  Diagnosis Date  . Acute diastolic CHF (congestive heart failure) ([place]) 11/11/2016  . Allergic rhinitis   . Anxiety   . Anxiety   . Arthritis    "~ all my joints" (05/18/2017)  . Atrial fibrillation ([place]) 09/2016  . Atrial fibrillation with RVR ([place]) 05/18/2017  . Chronic lower back pain   . COPD (chronic obstructive pulmonary disease) ([place])   . Depression   . Diverticulosis   . Dyspnea   . Dysrhythmia   . GERD (gastroesophageal reflux disease)   . Gout   . 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 Hyperlipidemia   . Hypertension   . Hypothyroidism   . Macular degeneration of both eyes   . Neuropathy   . On home oxygen therapy    "3L; 24/7" (05/18/2017)  . Pre-diabetes   . 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 Pulmonary fibrosis ([place])   . Rheumatoid arthritis ([place])   . Sleep apnea    uses 3 liters of o2 24/7  . Urticaria     ROS:   All systems reviewed and negative except as noted in the HPI.   Past Surgical History:  Procedure Laterality Date  . AV NODE ABLATION  05/18/2017  . AV NODE ABLATION N/A 05/18/2017   Procedure: AV NODE ABLATION;  Surgeon: Evans Lance, MD;  Location: [facility];  Service: Cardiovascular;  Laterality: N/A;  . BACK SURGERY    . BREAST LUMPECTOMY Left   . CARDIOVERSION N/A 01/30/2017   Procedure: CARDIOVERSION;   Surgeon: Josue Hector, MD;  [facility];  Service: Cardiovascular;  Laterality: N/A;  . CARDIOVERSION N/A 04/09/2017   Procedure: CARDIOVERSION;  Surgeon: Satira Sark, MD;  [facility];  Service: Cardiovascular;  Laterality: N/A;  . CATARACT EXTRACTION  W/ INTRAOCULAR LENS  IMPLANT, BILATERAL Bilateral   . DILATION AND CURETTAGE OF UTERUS    . EUS N/A 10/03/2015   Procedure: UPPER ENDOSCOPIC ULTRASOUND (EUS) RADIAL;  Surgeon: Arta Silence, MD;  [facility];  Service: Endoscopy;  Laterality: N/A;  . FINE NEEDLE ASPIRATION N/A 10/03/2015   Procedure: FINE NEEDLE ASPIRATION (FNA) RADIAL;  Surgeon: Arta Silence, MD;  [facility];  Service: Endoscopy;  Laterality: N/A;  . INSERT / REPLACE / REMOVE PACEMAKER  05/18/2017  . LUMBAR LAMINECTOMY  1995; 06/23/2008   L4-5/notes 08/20/2010  . [place]   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  . PACEMAKER IMPLANT N/A 05/18/2017   Procedure: PACEMAKER IMPLANT;  Surgeon: Evans Lance, MD;  Location: [facility];  Service: Cardiovascular;  Laterality: N/A;  . TOTAL ABDOMINAL HYSTERECTOMY    . TUBAL LIGATION       Family History  Problem Relation Age of Onset  . Diabetes Unknown   . Hypertension Unknown   . Coronary artery disease Unknown   . Stroke Unknown   . Asthma Unknown   . Kidney disease Sister   . Cancer Sister        lung  . Lung cancer Sister   . Cancer Sister        lung  . Lung cancer Sister   . Cancer Sister        lung  . Suicidality Son        committed suicide in 2009  . CVA Son   . Hyperlipidemia Unknown   . Anxiety disorder Unknown   . Migraines Unknown   . Heart failure Father   . Healthy Brother   . Cancer Sister        lung  . Healthy Sister      Social History   Socioeconomic History  . Marital status: Married    Spouse name: Not on file  . Number of children: 4  . Years of education: Not on file  . Highest education level: Not on  file  Social Needs  . Financial resource strain: Not hard at all  . Food insecurity - worry: Never true  . Food insecurity - inability: Never true  . Transportation needs - medical: No  . Transportation needs - non-medical: No  Occupational History  . Occupation: retired    Comment: cna  Tobacco Use  . Smoking status: Never Smoker  . Smokeless tobacco: Never Used  Substance and Sexual Activity  . Alcohol use: No  . Drug use: No  . Sexual activity: Not Currently    Birth control/protection: Surgical  Other Topics Concern  . 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   Pt lives with her husband in a single wide trailer. They have 4 children and 13 grandchildren.      BP 128/76   Pulse 79   Ht 5\' 7"  (1.702 m)   Wt 257 lb 9.6 oz (116.8 kg)   BMI 40.35 kg/m   Physical Exam:  Chronically ill appearing NAD HEENT: Unremarkable Neck:  7 cm JVD, no thyromegally Lymphatics:  No adenopathy Back:  No CVA tenderness Lungs:  Clear with no wheezes HEART:  IRegular rate rhythm, no murmurs, no rubs, no clicks Abd:  soft, positive bowel sounds, no organomegally, no rebound, no guarding Ext:  2 plus pulses, no edema, no cyanosis, no clubbing Skin:  No rashes no nodules Neuro:  CN II through XII intact, motor grossly intact  EKG - atrial fib with a controlled VR  DEVICE  Normal device function.  See PaceArt for details.   Assess/Plan: 1. Atrial fib - her rates are improved. No change in meds.  2. PPM - her Medtronic DDD PM with a His bundle and RV leads are working normally. Her His threshold has improved.  3. COPD - she is debiltated but is not on oxygen therapy. She will continue her current meds. 4. Chronic diastolic heart failure - her symptoms are class 3 although her dyspnea is multifactorial. Will follow.  Mikle Bosworth.D.

## 2017-06-18 NOTE — Patient Instructions (Signed)
Medication Instructions:  Your physician recommends that you continue on your current medications as directed. Please refer to the Current Medication list given to you today.  Labwork: None ordered.  Testing/Procedures: None ordered.  Follow-Up: Your physician wants you to follow-up Aug 20, 2017 @ 9:30 am at the [place] office.  Any Other Special Instructions Will Be Listed Below (If Applicable).  If you need a refill on your cardiac medications before your next appointment, please call your pharmacy.

## 2017-06-19 ENCOUNTER — Other Ambulatory Visit: Payer: Self-pay | Admitting: Internal Medicine

## 2017-06-19 LAB — CUP PACEART INCLINIC DEVICE CHECK
Battery Remaining Longevity: 119 mo
Battery Voltage: 3.19 V
Brady Statistic AP VP Percent: 14.91 %
Brady Statistic AP VS Percent: 0 %
Brady Statistic AS VP Percent: 0 %
Brady Statistic AS VS Percent: 85.09 %
Brady Statistic RA Percent Paced: 33.65 %
Brady Statistic RV Percent Paced: 14.91 %
Date Time Interrogation Session: 20190228175618
Implantable Lead Implant Date: 20190128
Implantable Lead Implant Date: 20190128
Implantable Lead Location: 753859
Implantable Lead Location: 753860
Implantable Lead Model: 3830
Implantable Lead Model: 5076
Implantable Pulse Generator Implant Date: 20190128
Lead Channel Impedance Value: 323 Ohm
Lead Channel Impedance Value: 380 Ohm
Lead Channel Impedance Value: 437 Ohm
Lead Channel Impedance Value: 456 Ohm
Lead Channel Pacing Threshold Amplitude: 0.75 V
Lead Channel Pacing Threshold Amplitude: 2 V
Lead Channel Pacing Threshold Pulse Width: 0.4 ms
Lead Channel Pacing Threshold Pulse Width: 1 ms
Lead Channel Sensing Intrinsic Amplitude: 12.25 mV
Lead Channel Sensing Intrinsic Amplitude: 2.625 mV
Lead Channel Setting Pacing Amplitude: 3.5 V
Lead Channel Setting Pacing Amplitude: 5 V
Lead Channel Setting Pacing Pulse Width: 0.4 ms
Lead Channel Setting Sensing Sensitivity: 0.9 mV

## 2017-06-24 ENCOUNTER — Encounter (INDEPENDENT_AMBULATORY_CARE_PROVIDER_SITE_OTHER): Payer: Medicare Other | Admitting: Ophthalmology

## 2017-06-24 DIAGNOSIS — H43813 Vitreous degeneration, bilateral: Secondary | ICD-10-CM

## 2017-06-24 DIAGNOSIS — E11319 Type 2 diabetes mellitus with unspecified diabetic retinopathy without macular edema: Secondary | ICD-10-CM

## 2017-06-24 DIAGNOSIS — H35033 Hypertensive retinopathy, bilateral: Secondary | ICD-10-CM | POA: Diagnosis not present

## 2017-06-24 DIAGNOSIS — H353231 Exudative age-related macular degeneration, bilateral, with active choroidal neovascularization: Secondary | ICD-10-CM

## 2017-06-24 DIAGNOSIS — I10 Essential (primary) hypertension: Secondary | ICD-10-CM | POA: Diagnosis not present

## 2017-06-24 DIAGNOSIS — E113393 Type 2 diabetes mellitus with moderate nonproliferative diabetic retinopathy without macular edema, bilateral: Secondary | ICD-10-CM

## 2017-06-24 LAB — HM DIABETES EYE EXAM

## 2017-06-29 ENCOUNTER — Other Ambulatory Visit: Payer: Self-pay | Admitting: Family

## 2017-06-29 ENCOUNTER — Other Ambulatory Visit: Payer: Self-pay | Admitting: Family Medicine

## 2017-06-29 DIAGNOSIS — F419 Anxiety disorder, unspecified: Secondary | ICD-10-CM

## 2017-06-29 DIAGNOSIS — R110 Nausea: Secondary | ICD-10-CM

## 2017-07-17 DIAGNOSIS — H353232 Exudative age-related macular degeneration, bilateral, with inactive choroidal neovascularization: Secondary | ICD-10-CM | POA: Diagnosis not present

## 2017-07-17 DIAGNOSIS — Z961 Presence of intraocular lens: Secondary | ICD-10-CM | POA: Diagnosis not present

## 2017-07-17 DIAGNOSIS — H02054 Trichiasis without entropian left upper eyelid: Secondary | ICD-10-CM | POA: Diagnosis not present

## 2017-07-17 DIAGNOSIS — H02055 Trichiasis without entropian left lower eyelid: Secondary | ICD-10-CM | POA: Diagnosis not present

## 2017-08-03 DIAGNOSIS — G43A0 Cyclical vomiting, not intractable: Secondary | ICD-10-CM | POA: Diagnosis not present

## 2017-08-03 DIAGNOSIS — K5289 Other specified noninfective gastroenteritis and colitis: Secondary | ICD-10-CM | POA: Diagnosis not present

## 2017-08-06 ENCOUNTER — Other Ambulatory Visit: Payer: Self-pay | Admitting: Family

## 2017-08-09 ENCOUNTER — Other Ambulatory Visit: Payer: Self-pay | Admitting: Family

## 2017-08-20 ENCOUNTER — Encounter: Payer: Self-pay | Admitting: *Deleted

## 2017-08-20 ENCOUNTER — Ambulatory Visit (INDEPENDENT_AMBULATORY_CARE_PROVIDER_SITE_OTHER): Payer: Medicare Other | Admitting: Internal Medicine

## 2017-08-20 VITALS — BP 116/64 | HR 97 | Ht 67.0 in | Wt 269.2 lb

## 2017-08-20 DIAGNOSIS — I481 Persistent atrial fibrillation: Secondary | ICD-10-CM

## 2017-08-20 DIAGNOSIS — I4819 Other persistent atrial fibrillation: Secondary | ICD-10-CM

## 2017-08-20 DIAGNOSIS — Z950 Presence of cardiac pacemaker: Secondary | ICD-10-CM

## 2017-08-20 NOTE — Patient Instructions (Signed)
Medication Instructions:  Your physician recommends that you continue on your current medications as directed. Please refer to the Current Medication list given to you today.   Labwork: NONE  Testing/Procedures: NONE   Follow-Up: Your physician wants you to follow-up in: 6 Months with Dr. Taylor.  You will receive a reminder letter in the mail two months in advance. If you don't receive a letter, please call our office to schedule the follow-up appointment.   Any Other Special Instructions Will Be Listed Below (If Applicable).     If you need a refill on your cardiac medications before your next appointment, please call your pharmacy.  Thank you for choosing [facility]!   

## 2017-08-20 NOTE — Progress Notes (Signed)
HPI [name] returns today for ongoing evaluation of atrial fibrillation.  She also has a history of oxygen dependent COPD and pulmonary fibrosis.  The patient underwent ablation of her AV node and insertion of a dual-chamber His bundle pacemaker a couple of months ago.  During the procedure, we could slow her heart down but we could not give her complete heart block secondary to the proximity of the His bundle pacing lead to the area of the His bundle for ablation.  The patient's ventricular rate has been reasonably well controlled, much better than prior to her ablation.  She continues to have dyspnea.  She is oxygen dependent on 3 L.  She has minimal peripheral edema.  No syncope.  No chest pain.  She has gained over 10 pounds in the past couple of months and admits to dietary indiscretion.  She states that eating is all she enjoys. Allergies  Allergen Reactions  . Allopurinol Swelling    Feet and legs swell  . Mobic [Meloxicam] Swelling and Other (See Comments)    Feet and Leg swelling     Current Outpatient Medications  Medication Sig Dispense Refill  . albuterol (PROVENTIL) (2.5 MG/3ML) 0.083% nebulizer solution Take 3 mLs (2.5 mg total) by nebulization every 6 (six) hours as needed for wheezing or shortness of breath. J96.01 150 mL 1  . apixaban (ELIQUIS) 5 MG TABS tablet Take 1 tablet (5 mg total) by mouth 2 (two) times daily. Resume 05/23/17 60 tablet 5  . azelastine (ASTELIN) 0.1 % nasal spray PLACE 2 SPRAYS INTO BOTH NOSTRILS 2 (TWO) TIMES DAILY AS NEEDED. 30 mL 5  . calcium citrate (CALCITRATE - DOSED IN MG ELEMENTAL CALCIUM) 950 MG tablet Take 1 tablet by mouth 2 (two) times daily.     . Cholecalciferol 2000 units CAPS Take 2,000 Units by mouth daily.     Marland Kitchen conjugated estrogens (PREMARIN) vaginal cream Place 0.5 g vaginally as needed. Weekly prn.  Use as directed    . cyanocobalamin 1000 MCG tablet Take 1,000 mcg by mouth daily.     Marland Kitchen DEXILANT 60 MG capsule Take 60 mg by  mouth daily.  5  . diazepam (VALIUM) 5 MG tablet TAKE 1 TABLET BY MOUTH TWICE A DAY AS NEEDED FOR ANXIETY 60 tablet 0  . diltiazem (CARDIZEM CD) 180 MG 24 hr capsule TAKE 1 CAPSULE BY MOUTH TWICE A DAY 60 capsule 5  . docusate sodium (COLACE) 100 MG capsule Take 200 mg by mouth daily as needed for mild constipation.    Marland Kitchen EPINEPHrine 0.3 mg/0.3 mL IJ SOAJ injection Inject 0.3 mg into the muscle once as needed (For anaphylaxis.).    Marland Kitchen FeFum-FePoly-FA-B Cmp-C-Biot (FOLIVANE-PLUS) CAPS Take 1 capsule by mouth 2 (two) times daily.  1  . ferrous sulfate 325 (65 FE) MG tablet Take 325 mg by mouth 2 (two) times daily with a meal.    . fluticasone (FLONASE) 50 MCG/ACT nasal spray PLACE 1 SPRAY INTO BOTH NOSTRILS 2 (TWO) TIMES DAILY AS NEEDED FOR ALLERGIES OR RHINITIS. (Patient taking differently: Place 1 spray into both nostrils daily. ) 16 g 5  . furosemide (LASIX) 40 MG tablet Take 40 mg ( 1 Tablet) Alternating with 60 mg ( 1 1/2 Tablet) Daily (Patient taking differently: Take 40-60 mg by mouth See admin instructions. Take 40 mg ( 1 Tablet) Alternating with 60 mg ( 1 1/2 Tablet) Daily) 90 tablet 6  . hydrocortisone 2.5 % cream Applied to affected areas  2 times per day for 1 week.  Do not apply to face. 30 g 0  . INCRUSE ELLIPTA 62.5 MCG/INH AEPB TAKE 1 PUFF BY MOUTH EVERY DAY  3  . levocetirizine (XYZAL) 5 MG tablet Take 5 mg by mouth every evening.     Marland Kitchen levothyroxine (SYNTHROID, LEVOTHROID) 100 MCG tablet TAKE 1 TABLET (100 MCG TOTAL) BY MOUTH DAILY BEFORE BREAKFAST. 90 tablet 1  . ondansetron (ZOFRAN) 8 MG tablet Take 8 mg by mouth every 8 (eight) hours as needed for nausea or vomiting.    Marland Kitchen OVER THE COUNTER MEDICATION Place 1 drop into both eyes as needed (For dry eyes.). Systane Gel Drops    . PATADAY 0.2 % SOLN Place 1 drop into both eyes daily.    . polyethylene glycol powder (GLYCOLAX/MIRALAX) powder TAKE 17 G BY MOUTH 2 (TWO) TIMES DAILY AS NEEDED. 3162 g 0  . predniSONE (DELTASONE) 10 MG tablet  Take 10 mg by mouth daily with breakfast.    . rosuvastatin (CRESTOR) 10 MG tablet TAKE 1 TABLET BY MOUTH EVERYDAY AT BEDTIME 90 tablet 1  . spironolactone (ALDACTONE) 25 MG tablet TAKE 1 TABLET BY MOUTH EVERY DAY 90 tablet 1  . traMADol (ULTRAM) 50 MG tablet TAKE 1 TABLET BY MOUTH EVERY 6 HOURS AS NEEDED (Patient taking differently: TAKE 1 TABLET BY MOUTH EVERY 6 HOURS AS NEEDED FOR PAIN) 90 tablet 2  . VIGAMOX 0.5 % ophthalmic solution Apply 1 drop to eye as needed (after eye injection).   12  . vitamin C (ASCORBIC ACID) 500 MG tablet Take 1,000 mg by mouth daily.     . Vitamin D, Ergocalciferol, (DRISDOL) 50000 units CAPS capsule TAKE 1 CAPSULE (50,000 UNITS TOTAL) BY MOUTH EVERY 7 (SEVEN) DAYS. 12 capsule 0  . vitamin E 400 UNIT capsule Take 400 Units by mouth daily.    . metoprolol tartrate (LOPRESSOR) 25 MG tablet Take 0.5 tablets (12.5 mg total) by mouth 2 (two) times daily. 60 tablet 1   No current facility-administered medications for this visit.      Past Medical History:  Diagnosis Date  . Acute diastolic CHF (congestive heart failure) ([place]) 11/11/2016  . Allergic rhinitis   . Anxiety   . Anxiety   . Arthritis    "~ all my joints" (05/18/2017)  . Atrial fibrillation ([place]) 09/2016  . Atrial fibrillation with RVR ([place]) 05/18/2017  . Chronic lower back pain   . COPD (chronic obstructive pulmonary disease) ([place])   . Depression   . Diverticulosis   . Dyspnea   . Dysrhythmia   . GERD (gastroesophageal reflux disease)   . Gout   . 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 Hyperlipidemia   . Hypertension   . Hypothyroidism   . Macular degeneration of both eyes   . Neuropathy   . On home oxygen therapy    "3L; 24/7" (05/18/2017)  . Pre-diabetes   . 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 Pulmonary fibrosis ([place])   . Rheumatoid arthritis ([place])   . Sleep apnea    uses 3 liters of o2 24/7  . Urticaria     ROS:   All  systems reviewed and negative except as noted in the HPI.   Past Surgical History:  Procedure Laterality Date  . AV NODE ABLATION  05/18/2017  . AV NODE ABLATION N/A 05/18/2017   Procedure: AV NODE ABLATION;  Surgeon: Evans Lance, MD;  Location: [facility];  Service: Cardiovascular;  Laterality: N/A;  . BACK SURGERY    . BREAST LUMPECTOMY Left   . CARDIOVERSION N/A 01/30/2017   Procedure: CARDIOVERSION;  Surgeon: Josue Hector, MD;  [facility];  Service: Cardiovascular;  Laterality: N/A;  . CARDIOVERSION N/A 04/09/2017   Procedure: CARDIOVERSION;  Surgeon: Satira Sark, MD;  [facility];  Service: Cardiovascular;  Laterality: N/A;  . CATARACT EXTRACTION W/ INTRAOCULAR LENS  IMPLANT, BILATERAL Bilateral   . DILATION AND CURETTAGE OF UTERUS    . EUS N/A 10/03/2015   Procedure: UPPER ENDOSCOPIC ULTRASOUND (EUS) RADIAL;  Surgeon: Arta Silence, MD;  [facility];  Service: Endoscopy;  Laterality: N/A;  . FINE NEEDLE ASPIRATION N/A 10/03/2015   Procedure: FINE NEEDLE ASPIRATION (FNA) RADIAL;  Surgeon: Arta Silence, MD;  [facility];  Service: Endoscopy;  Laterality: N/A;  . INSERT / REPLACE / REMOVE PACEMAKER  05/18/2017  . LUMBAR LAMINECTOMY  1995; 06/23/2008   L4-5/notes 08/20/2010  . [place]   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  . PACEMAKER IMPLANT N/A 05/18/2017   Procedure: PACEMAKER IMPLANT;  Surgeon: Evans Lance, MD;  Location: [facility];  Service: Cardiovascular;  Laterality: N/A;  . TOTAL ABDOMINAL HYSTERECTOMY    . TUBAL LIGATION       Family History  Problem Relation Age of Onset  . Diabetes Unknown   . Hypertension Unknown   . Coronary artery disease Unknown   . Stroke Unknown   . Asthma Unknown   . Kidney disease Sister   . Cancer Sister        lung  . Lung cancer Sister   . Cancer Sister        lung  . Lung cancer Sister   . Cancer Sister        lung  . Suicidality Son          committed suicide in 2009  . CVA Son   . Hyperlipidemia Unknown   . Anxiety disorder Unknown   . Migraines Unknown   . Heart failure Father   . Healthy Brother   . Cancer Sister        lung  . Healthy Sister      Social History   Socioeconomic History  . Marital status: Married    Spouse name: Not on file  . Number of children: 4  . Years of education: Not on file  . Highest education level: Not on file  Occupational History  . Occupation: retired    Comment: cna  Social Needs  . Financial resource strain: Not hard at all  . Food insecurity:    Worry: Never true    Inability: Never true  . Transportation needs:    Medical: No    Non-medical: No  Tobacco Use  . Smoking status: Never Smoker  . Smokeless tobacco: Never Used  Substance and Sexual Activity  . Alcohol use: No  . Drug use: No  . Sexual activity: Not Currently    Birth control/protection: Surgical  Lifestyle  . Physical activity:    Days per week: 0 days    Minutes per session: 0 min  . Stress: Rather much  Relationships  . Social connections:    Talks on phone: More than three times a week    Gets together: More than three times a week    Attends religious service: Never    Active member of  club or organization: No    Attends meetings of clubs or organizations: Never    Relationship status: Married  . Intimate partner violence:    Fear of current or ex partner: No    Emotionally abused: No    Physically abused: No    Forced sexual activity: No  Other Topics Concern  . 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   Pt lives with her husband in a single wide trailer. They have 4 children and 13 grandchildren.      BP 116/64   Pulse 97   Ht 5\' 7"  (1.702 m)   Wt 269 lb 3.2 oz (122.1 kg)   SpO2 95% Comment: on 3L O2 via [place]  BMI 42.16 kg/m   Physical Exam:  Chronically ill appearing NAD HEENT: Unremarkable Neck:  7  cm JVD, no thyromegally Lymphatics:  No adenopathy Back:  No CVA tenderness Lungs:  Clear with scattered rales and wheezes but no increased work of breating HEART:  Regular rate rhythm, no murmurs, no rubs, no clicks Abd:  soft, positive bowel sounds, no organomegally, no rebound, no guarding Ext:  2 plus pulses, no edema, no cyanosis, no clubbing Skin:  No rashes no nodules Neuro:  CN II through XII intact, motor grossly intact  DEVICE  Normal device function.  See PaceArt for details.  His bundle threshold is 1.75 V at 1 ms.  RV threshold is 0.75 V at 0.5 ms  Assess/Plan: 1.  Atrial fibrillation -she remains in atrial fibrillation but her rate is under much better control currently.  At this point I would not recommend repeat AV node ablation.  She will undergo watchful waiting. 2.  Chronic diastolic heart failure -her dyspnea is multifactorial, but she is not obviously volume overloaded today.  I encouraged the patient to reduce her salt intake and to lose weight by eating less. 3.  Obesity - the patient has gained 10 pounds in the last couple of months.  She admits to dietary indiscretion.  I have strongly encouraged the patient to eat less.  She is very sedentary and stays in a chair and watches TV.  I encouraged the patient to move her arms and legs on a regular basis when each TV showed changes. 4.  Oxygen dependent lung disease -I suspect that pulmonary fibrosis requiring oxygen as her biggest contributor to her dyspnea.  She will continue her medical therapy.  Hopefully she can be weaned from her prednisone.  Crissie Sickles, MD

## 2017-08-21 ENCOUNTER — Other Ambulatory Visit: Payer: Self-pay | Admitting: Family

## 2017-08-25 ENCOUNTER — Other Ambulatory Visit (HOSPITAL_COMMUNITY): Payer: Self-pay | Admitting: Nurse Practitioner

## 2017-08-26 ENCOUNTER — Ambulatory Visit (INDEPENDENT_AMBULATORY_CARE_PROVIDER_SITE_OTHER): Payer: Medicare Other | Admitting: Family

## 2017-08-26 ENCOUNTER — Encounter: Payer: Self-pay | Admitting: Family

## 2017-08-26 VITALS — BP 122/70 | HR 60 | Temp 97.6°F | Ht 67.0 in | Wt 266.4 lb

## 2017-08-26 DIAGNOSIS — E1159 Type 2 diabetes mellitus with other circulatory complications: Secondary | ICD-10-CM | POA: Diagnosis not present

## 2017-08-26 DIAGNOSIS — I10 Essential (primary) hypertension: Secondary | ICD-10-CM

## 2017-08-26 DIAGNOSIS — N182 Chronic kidney disease, stage 2 (mild): Secondary | ICD-10-CM | POA: Diagnosis not present

## 2017-08-26 DIAGNOSIS — E1142 Type 2 diabetes mellitus with diabetic polyneuropathy: Secondary | ICD-10-CM | POA: Diagnosis not present

## 2017-08-26 DIAGNOSIS — I152 Hypertension secondary to endocrine disorders: Secondary | ICD-10-CM

## 2017-08-26 DIAGNOSIS — E1169 Type 2 diabetes mellitus with other specified complication: Secondary | ICD-10-CM | POA: Diagnosis not present

## 2017-08-26 DIAGNOSIS — J449 Chronic obstructive pulmonary disease, unspecified: Secondary | ICD-10-CM | POA: Diagnosis not present

## 2017-08-26 DIAGNOSIS — I481 Persistent atrial fibrillation: Secondary | ICD-10-CM | POA: Diagnosis not present

## 2017-08-26 DIAGNOSIS — E785 Hyperlipidemia, unspecified: Secondary | ICD-10-CM | POA: Diagnosis not present

## 2017-08-26 DIAGNOSIS — I4819 Other persistent atrial fibrillation: Secondary | ICD-10-CM

## 2017-08-26 DIAGNOSIS — E039 Hypothyroidism, unspecified: Secondary | ICD-10-CM | POA: Diagnosis not present

## 2017-08-26 DIAGNOSIS — F419 Anxiety disorder, unspecified: Secondary | ICD-10-CM | POA: Diagnosis not present

## 2017-08-26 DIAGNOSIS — F331 Major depressive disorder, recurrent, moderate: Secondary | ICD-10-CM | POA: Diagnosis not present

## 2017-08-26 DIAGNOSIS — I5031 Acute diastolic (congestive) heart failure: Secondary | ICD-10-CM

## 2017-08-26 LAB — BAYER DCA HB A1C WAIVED: HB A1C (BAYER DCA - WAIVED): 6.7 % (ref <7.0)

## 2017-08-26 NOTE — Patient Instructions (Signed)

## 2017-08-26 NOTE — Progress Notes (Signed)
Subjective:    Patient ID: Lorraine Leonard, female    DOB: [birth year], 77 y.o.   MRN: [id number]  Chief Complaint  Patient presents with  . Hypothyroidism    check up and lab work  . Hyperlipidemia  . COPD   Pt presents to the office today for chronic follow up. Pt is followed by Cardiologists every 3 months for A Fib and CHF. 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  She is followed by her Pulmonologist every 6 months for Pulmonary hypertension, COPD, and Pulmonary fibrosis. Pt is on continuously oxygen at  3 L. States her breathing is stable, but becomes short of breath with any activity.   Hyperlipidemia  This is a chronic problem. The current episode started more than 1 year ago. The problem is controlled. Recent lipid tests were reviewed and are normal. Exacerbating diseases include obesity. Associated symptoms include shortness of breath. Current antihyperlipidemic treatment includes statins. The current treatment provides moderate improvement of lipids.  COPD  She complains of cough, difficulty breathing, hoarse voice, shortness of breath and wheezing ("at night"). This is a chronic problem. The current episode started more than 1 year ago. The problem occurs intermittently. The cough is non-productive. Associated symptoms include malaise/fatigue. Her past medical history is significant for COPD.  Diabetes  She presents for her follow-up diabetic visit. She has type 2 diabetes mellitus. Her disease course has been fluctuating. Hypoglycemia symptoms include nervousness/anxiousness. Associated symptoms include blurred vision, fatigue, foot paresthesias and visual change. There are no hypoglycemic complications. Symptoms are worsening. Diabetic complications include heart disease and peripheral neuropathy. Risk factors for coronary artery disease include dyslipidemia, diabetes mellitus, obesity and hypertension. (Does not check BS at home ) Eye exam is current.  Hypertension  This is a  chronic problem. The current episode started more than 1 year ago. The problem has been resolved since onset. The problem is controlled. Associated symptoms include anxiety, blurred vision, malaise/fatigue, palpitations ("every once in awhile") and shortness of breath. Pertinent negatives include no peripheral edema. Risk factors for coronary artery disease include dyslipidemia, diabetes mellitus and obesity. The current treatment provides moderate improvement. Hypertensive end-organ damage includes kidney disease and heart failure. Identifiable causes of hypertension include a thyroid problem.  Thyroid Problem  Presents for follow-up visit. Symptoms include anxiety, constipation, depressed mood, fatigue, hoarse voice, palpitations ("every once in awhile") and visual change. The symptoms have been stable. Her past medical history is significant for heart failure and hyperlipidemia.  Depression         This is a chronic problem.  The current episode started more than 1 year ago.   The onset quality is gradual.   The problem occurs intermittently.  The problem has been waxing and waning since onset.  Associated symptoms include fatigue, irritable, restlessness and sad.  Associated symptoms include no helplessness and no hopelessness.     The symptoms are aggravated by family issues.  Past medical history includes thyroid problem and anxiety.   Anxiety  Presents for follow-up visit. Symptoms include depressed mood, excessive worry, irritability, nervous/anxious behavior, palpitations ("every once in awhile"), restlessness and shortness of breath. Symptoms occur most days. The severity of symptoms is moderate. The quality of sleep is good.        Review of Systems  Constitutional: Positive for fatigue, irritability and malaise/fatigue.  HENT: Positive for hoarse voice.   Eyes: Positive for blurred vision.  Respiratory: Positive for cough, shortness of breath and wheezing ("at night").   Cardiovascular:  Positive for palpitations ("every once in awhile").  Gastrointestinal: Positive for constipation.  Psychiatric/Behavioral: Positive for depression. The patient is nervous/anxious.   All other systems reviewed and are negative.      Objective:   Physical Exam  Constitutional: She is oriented to person, place, and time. She appears well-developed and well-nourished. She is irritable. No distress.  Morbid obese   HENT:  Head: Normocephalic and atraumatic.  Right Ear: External ear normal.  Mouth/Throat: Oropharynx is clear and moist.  Eyes: Pupils are equal, round, and reactive to light.  Neck: Normal range of motion. Neck supple. No thyromegaly present.  Cardiovascular: Normal rate, regular rhythm, normal heart sounds and intact distal pulses.  No murmur heard. Pulmonary/Chest: Effort normal. No respiratory distress. She has wheezes.  3 L O2  Abdominal: Soft. Bowel sounds are normal. She exhibits no distension. There is no tenderness.  Musculoskeletal: Normal range of motion. She exhibits no edema or tenderness.  Generalized deconditioned, in wheelchair   Neurological: She is alert and oriented to person, place, and time. She has normal reflexes.  Skin: Skin is warm and dry.  Psychiatric: She has a normal mood and affect. Her behavior is normal. Judgment and thought content normal.  Vitals reviewed.  Diabetic Foot Exam - Simple   Simple Foot Form Diabetic Foot exam was performed with the following findings:  Yes 08/26/2017  3:14 PM  Visual Inspection No deformities, no ulcerations, no other skin breakdown bilaterally:  Yes Sensation Testing See comments:  Yes Pulse Check Posterior Tibialis and Dorsalis pulse intact bilaterally:  Yes Comments Negative monofilament on great toe and on  dorsum foot      BP 122/70   Pulse 60   Temp 97.6 F (36.4 C) (Oral)   Ht _0  (1.702 m)   Wt 266 lb 6.4 oz (120.8 kg)   BMI 41.72 kg/m      Assessment & Plan:  Lorraine Leonard comes in  today with chief complaint of Hypothyroidism (check up and lab work); Hyperlipidemia; COPD; and Medical Management of Chronic Issues   Diagnosis and orders addressed:  1. Hypertension associated with diabetes ([place]) - CMP14+EGFR  2. Hyperlipidemia associated with type 2 diabetes mellitus (HCC) - CMP14+EGFR - Lipid panel  3. Hypothyroidism, unspecified type - CMP14+EGFR - TSH  4. Morbid obesity ([place]) - CMP14+EGFR  5. Moderate episode of recurrent major depressive disorder (HCC) - CMP14+EGFR  6. Anxiety - CMP14+EGFR  7. CKD (chronic kidney disease) stage 2, GFR 60-89 ml/min - CMP14+EGFR  8. Type 2 diabetes mellitus with diabetic polyneuropathy, without long-term current use of insulin (HCC) - Microalbumin / creatinine urine ratio - Bayer DCA Hb A1c Waived - CMP14+EGFR  9. Persistent atrial fibrillation (HCC) - CMP14+EGFR  10. Acute diastolic CHF (congestive heart failure) (HCC) - CMP14+EGFR  11. Chronic obstructive pulmonary disease, unspecified COPD type ([place]) - CMP14+EGFR   Labs pending Health Maintenance reviewed Diet and exercise encouraged  Follow up plan: 6 months    Evelina Dun, FNP

## 2017-08-27 LAB — CMP14+EGFR
ALT: 21 IU/L (ref 0–32)
AST: 23 IU/L (ref 0–40)
Albumin/Globulin Ratio: 1.5 (ref 1.2–2.2)
Albumin: 4.5 g/dL (ref 3.5–4.8)
Alkaline Phosphatase: 65 IU/L (ref 39–117)
BUN/Creatinine Ratio: 18 (ref 12–28)
BUN: 22 mg/dL (ref 8–27)
Bilirubin Total: 0.4 mg/dL (ref 0.0–1.2)
CO2: 26 mmol/L (ref 20–29)
Calcium: 10.2 mg/dL (ref 8.7–10.3)
Chloride: 97 mmol/L (ref 96–106)
Creatinine, Ser: 1.25 mg/dL — ABNORMAL HIGH (ref 0.57–1.00)
GFR calc Af Amer: 48 mL/min/{1.73_m2} — ABNORMAL LOW (ref >59)
GFR calc non Af Amer: 42 mL/min/{1.73_m2} — ABNORMAL LOW (ref >59)
Globulin, Total: 3 g/dL (ref 1.5–4.5)
Glucose: 177 mg/dL — ABNORMAL HIGH (ref 65–99)
Potassium: 4.6 mmol/L (ref 3.5–5.2)
Sodium: 143 mmol/L (ref 134–144)
Total Protein: 7.5 g/dL (ref 6.0–8.5)

## 2017-08-27 LAB — MICROALBUMIN / CREATININE URINE RATIO
Creatinine, Urine: 37.5 mg/dL
Microalb/Creat Ratio: 8.8 mg/g creat (ref 0.0–30.0)
Microalbumin, Urine: 3.3 ug/mL

## 2017-08-27 LAB — TSH: TSH: 2.83 u[IU]/mL (ref 0.450–4.500)

## 2017-08-27 LAB — LIPID PANEL
Chol/HDL Ratio: 3 ratio (ref 0.0–4.4)
Cholesterol, Total: 155 mg/dL (ref 100–199)
HDL: 51 mg/dL (ref >39)
LDL Calculated: 70 mg/dL (ref 0–99)
Triglycerides: 170 mg/dL — ABNORMAL HIGH (ref 0–149)
VLDL Cholesterol Cal: 34 mg/dL (ref 5–40)

## 2017-08-31 ENCOUNTER — Ambulatory Visit (INDEPENDENT_AMBULATORY_CARE_PROVIDER_SITE_OTHER): Payer: Medicare Other | Admitting: Cardiology

## 2017-08-31 ENCOUNTER — Encounter: Payer: Self-pay | Admitting: *Deleted

## 2017-08-31 VITALS — BP 128/78 | HR 64 | Ht 67.0 in | Wt 267.0 lb

## 2017-08-31 DIAGNOSIS — I5032 Chronic diastolic (congestive) heart failure: Secondary | ICD-10-CM

## 2017-08-31 DIAGNOSIS — I4891 Unspecified atrial fibrillation: Secondary | ICD-10-CM

## 2017-08-31 NOTE — Patient Instructions (Signed)

## 2017-08-31 NOTE — Progress Notes (Signed)
Clinical Summary Lorraine Leonard is a 77 y.o.female seen today for follow up of the following medical problems.   1. Afib - followed by EP. Failed rate control due to bradycardia. Unable to maintain rhythm control - she is s/p AV node ablation with pacemaker placement by EP.   - isolated episode of palpitations - compliant with meds - no bleeding on eliquis.   2. Chronic diastolic Hf - home weights 245 at home. Down from 250 lbs. No recent edema, no SOB/DOE  - weight Jan 2019 253 lbs. Today up to 266 lbs.   3. Pulmonary fibrosis - followed by pulmionary - normally 3 L 24 hrs per day.    Past Medical History:  Diagnosis Date  . Acute diastolic CHF (congestive heart failure) ([place]) 11/11/2016  . Allergic rhinitis   . Anxiety   . Anxiety   . Arthritis    "~ all my joints" (05/18/2017)  . Atrial fibrillation ([place]) 09/2016  . Atrial fibrillation with RVR ([place]) 05/18/2017  . Chronic lower back pain   . COPD (chronic obstructive pulmonary disease) ([place])   . Depression   . Diverticulosis   . Dyspnea   . Dysrhythmia   . GERD (gastroesophageal reflux disease)   . Gout   . 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 Hyperlipidemia   . Hypertension   . Hypothyroidism   . Macular degeneration of both eyes   . Neuropathy   . On home oxygen therapy    "3L; 24/7" (05/18/2017)  . Pre-diabetes   . 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 Pulmonary fibrosis ([place])   . Rheumatoid arthritis ([place])   . Sleep apnea    uses 3 liters of o2 24/7  . Urticaria      Allergies  Allergen Reactions  . Allopurinol Swelling    Feet and legs swell  . Mobic [Meloxicam] Swelling and Other (See Comments)    Feet and Leg swelling     Current Outpatient Medications  Medication Sig Dispense Refill  . albuterol (PROVENTIL) (2.5 MG/3ML) 0.083% nebulizer solution Take 3 mLs (2.5 mg total) by nebulization every 6 (six) hours as needed for wheezing  or shortness of breath. J96.01 150 mL 1  . apixaban (ELIQUIS) 5 MG TABS tablet Take 1 tablet (5 mg total) by mouth 2 (two) times daily. Resume 05/23/17 60 tablet 5  . azelastine (ASTELIN) 0.1 % nasal spray PLACE 2 SPRAYS INTO BOTH NOSTRILS 2 (TWO) TIMES DAILY AS NEEDED. 30 mL 5  . calcium citrate (CALCITRATE - DOSED IN MG ELEMENTAL CALCIUM) 950 MG tablet Take 1 tablet by mouth 2 (two) times daily.     . Cholecalciferol 2000 units CAPS Take 2,000 Units by mouth daily.     Marland Kitchen conjugated estrogens (PREMARIN) vaginal cream Place 0.5 g vaginally as needed. Weekly prn.  Use as directed    . cyanocobalamin 1000 MCG tablet Take 1,000 mcg by mouth daily.     Marland Kitchen DEXILANT 60 MG capsule Take 60 mg by mouth daily.  5  . diazepam (VALIUM) 5 MG tablet TAKE 1 TABLET BY MOUTH TWICE A DAY AS NEEDED FOR ANXIETY 60 tablet 0  . diltiazem (CARDIZEM CD) 180 MG 24 hr capsule TAKE 1 CAPSULE BY MOUTH TWICE A DAY 60 capsule 5  . docusate sodium (COLACE) 100 MG capsule Take 200 mg by mouth daily as needed for mild constipation.    Marland Kitchen EPINEPHrine 0.3  mg/0.3 mL IJ SOAJ injection Inject 0.3 mg into the muscle once as needed (For anaphylaxis.).    Marland Kitchen FeFum-FePoly-FA-B Cmp-C-Biot (FOLIVANE-PLUS) CAPS Take 1 capsule by mouth 2 (two) times daily.  1  . fluticasone (FLONASE) 50 MCG/ACT nasal spray PLACE 1 SPRAY INTO BOTH NOSTRILS 2 (TWO) TIMES DAILY AS NEEDED FOR ALLERGIES OR RHINITIS. (Patient taking differently: Place 1 spray into both nostrils daily. ) 16 g 5  . furosemide (LASIX) 40 MG tablet Take 40 mg ( 1 Tablet) Alternating with 60 mg ( 1 1/2 Tablet) Daily (Patient taking differently: Take 40-60 mg by mouth See admin instructions. Take 40 mg ( 1 Tablet) Alternating with 60 mg ( 1 1/2 Tablet) Daily) 90 tablet 6  . hydrocortisone 2.5 % cream Applied to affected areas 2 times per day for 1 week.  Do not apply to face. 30 g 0  . INCRUSE ELLIPTA 62.5 MCG/INH AEPB TAKE 1 PUFF BY MOUTH EVERY DAY  3  . levocetirizine (XYZAL) 5 MG tablet  Take 5 mg by mouth every evening.     Marland Kitchen levothyroxine (SYNTHROID, LEVOTHROID) 100 MCG tablet TAKE 1 TABLET (100 MCG TOTAL) BY MOUTH DAILY BEFORE BREAKFAST. 90 tablet 1  . metoprolol tartrate (LOPRESSOR) 25 MG tablet TAKE 0.5 TABLETS (12.5 MG TOTAL) BY MOUTH 2 (TWO) TIMES DAILY. 180 tablet 3  . ondansetron (ZOFRAN) 8 MG tablet Take 8 mg by mouth every 8 (eight) hours as needed for nausea or vomiting.    Marland Kitchen OVER THE COUNTER MEDICATION Place 1 drop into both eyes as needed (For dry eyes.). Systane Gel Drops    . PATADAY 0.2 % SOLN Place 1 drop into both eyes daily.    . polyethylene glycol powder (GLYCOLAX/MIRALAX) powder TAKE 17 G BY MOUTH 2 (TWO) TIMES DAILY AS NEEDED. 3162 g 0  . predniSONE (DELTASONE) 10 MG tablet Take 10 mg by mouth daily with breakfast.    . rosuvastatin (CRESTOR) 10 MG tablet TAKE 1 TABLET BY MOUTH EVERYDAY AT BEDTIME 90 tablet 1  . spironolactone (ALDACTONE) 25 MG tablet TAKE 1 TABLET BY MOUTH EVERY DAY 90 tablet 1  . traMADol (ULTRAM) 50 MG tablet TAKE 1 TABLET BY MOUTH EVERY 6 HOURS AS NEEDED (Patient not taking: Reported on 08/26/2017) 90 tablet 2  . VIGAMOX 0.5 % ophthalmic solution Apply 1 drop to eye as needed (after eye injection).   12  . vitamin C (ASCORBIC ACID) 500 MG tablet Take 1,000 mg by mouth daily.     . Vitamin D, Ergocalciferol, (DRISDOL) 50000 units CAPS capsule TAKE 1 CAPSULE (50,000 UNITS TOTAL) BY MOUTH EVERY 7 (SEVEN) DAYS. 12 capsule 0  . vitamin E 400 UNIT capsule Take 400 Units by mouth daily.     No current facility-administered medications for this visit.      Past Surgical History:  Procedure Laterality Date  . AV NODE ABLATION  05/18/2017  . AV NODE ABLATION N/A 05/18/2017   Procedure: AV NODE ABLATION;  Surgeon: Evans Lance, MD;  Location: [facility];  Service: Cardiovascular;  Laterality: N/A;  . BACK SURGERY    . BREAST LUMPECTOMY Left   . CARDIOVERSION N/A 01/30/2017   Procedure: CARDIOVERSION;  Surgeon: Josue Hector, MD;   Location[facility];  Service: Cardiovascular;  Laterality: N/A;  . CARDIOVERSION N/A 04/09/2017   Procedure: CARDIOVERSION;  Surgeon: Satira Sark, MD;  Location: [facility];  Service: Cardiovascular;  Laterality: N/A;  . CATARACT EXTRACTION W/ INTRAOCULAR LENS  IMPLANT,  BILATERAL Bilateral   . DILATION AND CURETTAGE OF UTERUS    . EUS N/A 10/03/2015   Procedure: UPPER ENDOSCOPIC ULTRASOUND (EUS) RADIAL;  Surgeon: Arta Silence, MD;  Location: [facility];  Service: Endoscopy;  Laterality: N/A;  . FINE NEEDLE ASPIRATION N/A 10/03/2015   Procedure: FINE NEEDLE ASPIRATION (FNA) RADIAL;  Surgeon: Arta Silence, MD;  Location: [facility];  Service: Endoscopy;  Laterality: N/A;  . INSERT / REPLACE / REMOVE PACEMAKER  05/18/2017  . LUMBAR LAMINECTOMY  1995; 06/23/2008   L4-5/notes 08/20/2010  . 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 PACEMAKER IMPLANT N/A 05/18/2017   Procedure: PACEMAKER IMPLANT;  Surgeon: Evans Lance, MD;  Location: [facility];  Service: Cardiovascular;  Laterality: N/A;  . TOTAL ABDOMINAL HYSTERECTOMY    . TUBAL LIGATION       Allergies  Allergen Reactions  . Allopurinol Swelling    Feet and legs swell  . Mobic [Meloxicam] Swelling and Other (See Comments)    Feet and Leg swelling      Family History  Problem Relation Age of Onset  . Diabetes Unknown   . Hypertension Unknown   . Coronary artery disease Unknown   . Stroke Unknown   . Asthma Unknown   . Kidney disease Sister   . Cancer Sister        lung  . Lung cancer Sister   . Cancer Sister        lung  . Lung cancer Sister   . Cancer Sister        lung  . Suicidality Son        committed suicide in 2009  . CVA Son   . Hyperlipidemia Unknown   . Anxiety disorder Unknown   . Migraines Unknown   . Heart failure Father   . Healthy Brother   . Cancer Sister        lung  . Healthy Sister      Social History Ms. Grogan reports that she has never  smoked. She has never used smokeless tobacco. Ms. Lehenbauer reports that she does not drink alcohol.   Review of Systems CONSTITUTIONAL: No weight loss, fever, chills, weakness or fatigue.  HEENT: Eyes: No visual loss, blurred vision, double vision or yellow sclerae.No hearing loss, sneezing, congestion, runny nose or sore throat.  SKIN: No rash or itching.  CARDIOVASCULAR: per hpi RESPIRATORY: chronic SOB GASTROINTESTINAL: No anorexia, nausea, vomiting or diarrhea. No abdominal pain or blood.  GENITOURINARY: No burning on urination, no polyuria NEUROLOGICAL: No headache, dizziness, syncope, paralysis, ataxia, numbness or tingling in the extremities. No change in bowel or bladder control.  MUSCULOSKELETAL: No muscle, back pain, joint pain or stiffness.  LYMPHATICS: No enlarged nodes. No history of splenectomy.  PSYCHIATRIC: No history of depression or anxiety.  ENDOCRINOLOGIC: No reports of sweating, cold or heat intolerance. No polyuria or polydipsia.  Marland Kitchen   Physical Examination Vitals:   08/31/17 1411  BP: 128/78  Pulse: 64  SpO2: 90%   Vitals:   08/31/17 1411  Weight: 267 lb (121.1 kg)  Height: 5\' 7"  (1.702 m)    Gen: resting comfortably, no acute distress HEENT: no scleral icterus, pupils equal round and reactive, no palptable cervical adenopathy,  CV: RRR, no m/r/g, no jvd Resp: Clear to auscultation bilaterally GI: abdomen is soft, non-tender, non-distended, normal bowel sounds, no hepatosplenomegaly MSK: extremities are warm, no edema.  Skin: warm, no rash Neuro:  no focal deficits  Psych: appropriate affect   Diagnostic Studies 11/2015 echo Study Conclusions  - Left ventricle: The cavity size was normal. There was severe focal basal hypertrophy of the septum. Systolic function was vigorous. The estimated ejection fraction was in the range of 65% to 70%. Wall motion was normal; there were no regional wall motion abnormalities. - Mitral valve: Moderately  calcified annulus. There was mild regurgitation. - Left atrium: Volume/bsa, ES (1-plane Simpson&'s, A4C): 26.5 ml/m^2. - Right ventricle: The cavity size was normal. Wall thickness was mildly increased. - Pulmonary arteries: Systolic pressure was within the normal range.   09/2016 echo Study Conclusions  - Left ventricle: The cavity size was normal. Wall thickness was increased in a pattern of mild LVH. Systolic function was normal. The estimated ejection fraction was in the range of 60% to 65%. Wall motion was normal; there were no regional wall motion abnormalities. Features are consistent with a pseudonormal left ventricular filling pattern, with concomitant abnormal relaxation and increased filling pressure (grade 2 diastolic dysfunction). - Aortic valve: Mildly calcified annulus. Trileaflet. - Mitral valve: Calcified annulus. - Right atrium: Central venous pressure (est): 3 mm Hg. - Atrial septum: No defect or patent foramen ovale was identified. - Tricuspid valve: There was trivial regurgitation. - Pulmonary arteries: PA peak pressure: 25 mm Hg (S). - Pericardium, extracardiac: A prominent pericardial fat pad was present.  Impressions:  - Mild LVH with LVEF 60-65% and grade 2 diastolic dysfunction. Calcified mitral annulus. Mildly sclerotic aortic valve. Trivial tricuspid regurgitation with normal PASP estimated 25 mmHg. Prominent pericardial fat pad noted.      Assessment and Plan   1. Afib - isolated episode of symptoms, continue current meds S/p av node ablation with pacemaker, follow with EP - continue anticoag.   2. Chronic diastolic HF - some increase weight gain, but no evidence of fluid overload by exam - continue current meds        Arnoldo Lenis, M.D.

## 2017-09-01 ENCOUNTER — Telehealth: Payer: Self-pay | Admitting: Family

## 2017-09-02 NOTE — Telephone Encounter (Signed)
lmtcb- christy will be back in tomorrow

## 2017-09-02 NOTE — Telephone Encounter (Signed)
Pt is aware that christy will be back in office tomorrow

## 2017-09-03 MED ORDER — TRAMADOL HCL 50 MG PO TABS: 50.0000 mg | ORAL_TABLET | Freq: Four times a day (QID) | ORAL | 2 refills | Status: DC | PRN

## 2017-09-03 NOTE — Telephone Encounter (Signed)
Prescription sent to pharmacy.

## 2017-09-06 ENCOUNTER — Encounter: Payer: Self-pay | Admitting: Cardiology

## 2017-09-14 ENCOUNTER — Other Ambulatory Visit: Payer: Self-pay | Admitting: Family

## 2017-09-14 DIAGNOSIS — F419 Anxiety disorder, unspecified: Secondary | ICD-10-CM

## 2017-09-15 NOTE — Telephone Encounter (Signed)
Last seen 08/26/17  Lorraine Leonard 

## 2017-09-28 ENCOUNTER — Emergency Department (HOSPITAL_COMMUNITY): Payer: Medicare Other

## 2017-09-28 ENCOUNTER — Other Ambulatory Visit: Payer: Self-pay

## 2017-09-28 ENCOUNTER — Emergency Department (HOSPITAL_COMMUNITY)
Admission: EM | Admit: 2017-09-28 | Discharge: 2017-09-28 | Disposition: A | Discharge status: Home / Self Care | Payer: Medicare Other | Attending: Emergency Medicine | Admitting: Emergency Medicine

## 2017-09-28 ENCOUNTER — Encounter (HOSPITAL_COMMUNITY): Payer: Self-pay | Admitting: Emergency Medicine

## 2017-09-28 DIAGNOSIS — E039 Hypothyroidism, unspecified: Secondary | ICD-10-CM | POA: Diagnosis not present

## 2017-09-28 DIAGNOSIS — N182 Chronic kidney disease, stage 2 (mild): Secondary | ICD-10-CM | POA: Insufficient documentation

## 2017-09-28 DIAGNOSIS — I129 Hypertensive chronic kidney disease with stage 1 through stage 4 chronic kidney disease, or unspecified chronic kidney disease: Secondary | ICD-10-CM | POA: Diagnosis not present

## 2017-09-28 DIAGNOSIS — Z79899 Other long term (current) drug therapy: Secondary | ICD-10-CM | POA: Insufficient documentation

## 2017-09-28 DIAGNOSIS — R1084 Generalized abdominal pain: Secondary | ICD-10-CM | POA: Diagnosis not present

## 2017-09-28 DIAGNOSIS — Z7901 Long term (current) use of anticoagulants: Secondary | ICD-10-CM | POA: Diagnosis not present

## 2017-09-28 DIAGNOSIS — Z950 Presence of cardiac pacemaker: Secondary | ICD-10-CM | POA: Diagnosis not present

## 2017-09-28 DIAGNOSIS — R109 Unspecified abdominal pain: Secondary | ICD-10-CM | POA: Insufficient documentation

## 2017-09-28 DIAGNOSIS — E1122 Type 2 diabetes mellitus with diabetic chronic kidney disease: Secondary | ICD-10-CM | POA: Diagnosis not present

## 2017-09-28 DIAGNOSIS — R079 Chest pain, unspecified: Secondary | ICD-10-CM | POA: Diagnosis not present

## 2017-09-28 DIAGNOSIS — K5730 Diverticulosis of large intestine without perforation or abscess without bleeding: Secondary | ICD-10-CM | POA: Diagnosis not present

## 2017-09-28 LAB — URINALYSIS, ROUTINE W REFLEX MICROSCOPIC
Bilirubin Urine: NEGATIVE
Glucose, UA: NEGATIVE mg/dL
Hgb urine dipstick: NEGATIVE
Ketones, ur: NEGATIVE mg/dL
Leukocytes, UA: NEGATIVE
Nitrite: NEGATIVE
Protein, ur: NEGATIVE mg/dL
Specific Gravity, Urine: 1.008 (ref 1.005–1.030)
pH: 7 (ref 5.0–8.0)

## 2017-09-28 LAB — COMPREHENSIVE METABOLIC PANEL
ALT: 31 U/L (ref 14–54)
AST: 35 U/L (ref 15–41)
Albumin: 4 g/dL (ref 3.5–5.0)
Alkaline Phosphatase: 60 U/L (ref 38–126)
Anion gap: 12 (ref 5–15)
BUN: 22 mg/dL — ABNORMAL HIGH (ref 6–20)
CO2: 29 mmol/L (ref 22–32)
Calcium: 9.9 mg/dL (ref 8.9–10.3)
Chloride: 99 mmol/L — ABNORMAL LOW (ref 101–111)
Creatinine, Ser: 1.43 mg/dL — ABNORMAL HIGH (ref 0.44–1.00)
GFR calc Af Amer: 40 mL/min — ABNORMAL LOW (ref >60)
GFR calc non Af Amer: 34 mL/min — ABNORMAL LOW (ref >60)
Glucose, Bld: 230 mg/dL — ABNORMAL HIGH (ref 65–99)
Potassium: 4.5 mmol/L (ref 3.5–5.1)
Sodium: 140 mmol/L (ref 135–145)
Total Bilirubin: 0.7 mg/dL (ref 0.3–1.2)
Total Protein: 7.7 g/dL (ref 6.5–8.1)

## 2017-09-28 LAB — CBC
HCT: 42.8 % (ref 36.0–46.0)
Hemoglobin: 13.7 g/dL (ref 12.0–15.0)
MCH: 30.2 pg (ref 26.0–34.0)
MCHC: 32 g/dL (ref 30.0–36.0)
MCV: 94.3 fL (ref 78.0–100.0)
Platelets: 293 10*3/uL (ref 150–400)
RBC: 4.54 MIL/uL (ref 3.87–5.11)
RDW: 13.5 % (ref 11.5–15.5)
WBC: 13.2 10*3/uL — ABNORMAL HIGH (ref 4.0–10.5)

## 2017-09-28 LAB — DIFFERENTIAL
Basophils Absolute: 0 10*3/uL (ref 0.0–0.1)
Basophils Relative: 0 %
Eosinophils Absolute: 0 10*3/uL (ref 0.0–0.7)
Eosinophils Relative: 0 %
Lymphocytes Relative: 14 %
Lymphs Abs: 1.8 10*3/uL (ref 0.7–4.0)
Monocytes Absolute: 0.5 10*3/uL (ref 0.1–1.0)
Monocytes Relative: 4 %
Neutro Abs: 10.4 10*3/uL — ABNORMAL HIGH (ref 1.7–7.7)
Neutrophils Relative %: 82 %

## 2017-09-28 LAB — LIPASE, BLOOD: Lipase: 36 U/L (ref 11–51)

## 2017-09-28 MED ORDER — FAMOTIDINE IN NACL 20-0.9 MG/50ML-% IV SOLN
20.0000 mg | Freq: Once | INTRAVENOUS | Status: AC
  Administered 2017-09-28: 20 mg via INTRAVENOUS
  Filled 2017-09-28: qty 50

## 2017-09-28 MED ORDER — FAMOTIDINE 20 MG PO TABS: 20.0000 mg | ORAL_TABLET | Freq: Two times a day (BID) | ORAL | 0 refills | Status: DC

## 2017-09-28 NOTE — Discharge Instructions (Signed)
Eat a bland diet, avoiding greasy, fatty, fried foods, as well as spicy and acidic foods or beverages.  Avoid eating within 2 to 3 hours before going to bed or laying down.  Also avoid teas, colas, coffee, chocolate, pepermint and spearment.  Take the prescription as directed. May also take over the counter maalox/mylanta, as directed on packaging, as needed for discomfort. Call your regular medical doctor tomorrow to schedule a follow up appointment in the next 3 days. Call your GI doctor tomorrow to schedule a follow up appointment within the next week.  Return to the Emergency Department immediately if worsening.

## 2017-09-28 NOTE — ED Triage Notes (Signed)
Pt reports abd pain for several days, sates her last normal BM was 3-4 days ago. Has taken Miralax with small BM today. Denies V/D/ or GU sx.

## 2017-09-28 NOTE — ED Provider Notes (Signed)
[facility] Provider Note   CSN: [id number] Arrival date & time: 09/28/17  1642     History   Chief Complaint Chief Complaint  Patient presents with  . Abdominal Pain    HPI Lorraine Leonard is a 77 y.o. female.  HPI  Pt was seen at 1815.  Per pt, c/o gradual onset and persistence of constant generalized abd "pain" for the past several days.  Has been associated with "constipation." Last BM 3-4 days ago. Pt states she took a laxative and had a small BM today.  Describes the abd pain as "cramping." Pt states she took a tramadol with improvement of her abd pain.  Denies N/V, no diarrhea, no fevers, no back pain, no rash, no CP/SOB, no black or blood in stools.      Past Medical History:  Diagnosis Date  . Acute diastolic CHF (congestive heart failure) ([place]) 11/11/2016  . Allergic rhinitis   . Anxiety   . Anxiety   . Arthritis    "~ all my joints" (05/18/2017)  . Atrial fibrillation ([place]) 09/2016  . Atrial fibrillation with RVR ([place]) 05/18/2017  . Chronic lower back pain   . COPD (chronic obstructive pulmonary disease) ([place])   . Depression   . Diverticulosis   . Dyspnea   . Dysrhythmia   . GERD (gastroesophageal reflux disease)   . Gout   . 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 Hyperlipidemia   . Hypertension   . Hypothyroidism   . Macular degeneration of both eyes   . Neuropathy   . On home oxygen therapy    "3L; 24/7" (05/18/2017)  . Pre-diabetes   . 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 Pulmonary fibrosis ([place])   . Pulmonary fibrosis ([place])   . Rheumatoid arthritis ([place])   . Sleep apnea    uses 3 liters of o2 24/7  . Urticaria     Patient Active Problem List   Diagnosis Date Noted  . Chronic respiratory failure with hypoxia ([place]) 03/26/2017  . COPD (chronic obstructive pulmonary disease) ([place]) 03/26/2017  . Atrial fibrillation ([place]) 02/09/2017  . Paroxysmal atrial fibrillation ([place])  12/17/2016  . Black stool 12/17/2016  . Acute diastolic CHF (congestive heart failure) ([place]) 11/11/2016  . CKD (chronic kidney disease) stage 2, GFR 60-89 ml/min 11/11/2016  . Pulmonary fibrosis ([place]) 09/23/2016  . Diabetes mellitus ([place]) 07/17/2016  . Morbid obesity ([place]) 04/18/2016  . Snoring 11/09/2015  . Pulmonary hypertension ([place]) 11/09/2015  . Gout 10/19/2015  . Chronic urticaria 03/24/2015  . Metabolic syndrome 85/46/2703  . Single kidney 03/01/2015  . Macular degeneration of both eyes 11/28/2014  . Constipation 11/28/2014  . Chronic rhinitis 11/17/2014  . Hypertensive retinopathy 10/30/2014  . Vitamin D deficiency 08/17/2014  . Arthritis of knee 08/17/2014  . Hyperlipidemia associated with type 2 diabetes mellitus ([name]) 08/17/2014  . Depression   . Hypothyroidism   . Anxiety   . Cardiomegaly 05/05/2011  . Hypertension associated with diabetes ([place]) 02/08/2007  . 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 GASTROESOPHAGEAL REFLUX DISEASE 02/08/2007    Past Surgical History:  Procedure Laterality Date  . AV NODE ABLATION  05/18/2017  . AV NODE ABLATION N/A 05/18/2017   Procedure: AV NODE ABLATION;  Surgeon: Evans Lance, MD;  Location: [facility];  Service: Cardiovascular;  Laterality: N/A;  . BACK SURGERY    . BREAST LUMPECTOMY Left   . CARDIOVERSION N/A  01/30/2017   Procedure: CARDIOVERSION;  Surgeon: Josue Hector, MD;  [facility];  Service: Cardiovascular;  Laterality: N/A;  . CARDIOVERSION N/A 04/09/2017   Procedure: CARDIOVERSION;  Surgeon: Satira Sark, MD;  [facility]: [facility];  Service: Cardiovascular;  Laterality: N/A;  . CATARACT EXTRACTION W/ INTRAOCULAR LENS  IMPLANT, BILATERAL Bilateral   . DILATION AND CURETTAGE OF UTERUS    . EUS N/A 10/03/2015   Procedure: UPPER ENDOSCOPIC ULTRASOUND (EUS) RADIAL;  Surgeon: Arta Silence, MD;  Location: [facility];  Service: Endoscopy;  Laterality: N/A;  . FINE NEEDLE ASPIRATION N/A 10/03/2015    Procedure: FINE NEEDLE ASPIRATION (FNA) RADIAL;  Surgeon: Arta Silence, MD;  Location: [facility];  Service: Endoscopy;  Laterality: N/A;  . INSERT / REPLACE / REMOVE PACEMAKER  05/18/2017  . LUMBAR LAMINECTOMY  1995; 06/23/2008   L4-5/notes 08/20/2010  . [place]   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  . PACEMAKER IMPLANT N/A 05/18/2017   Procedure: PACEMAKER IMPLANT;  Surgeon: Evans Lance, MD;  Location: [facility];  Service: Cardiovascular;  Laterality: N/A;  . TOTAL ABDOMINAL HYSTERECTOMY    . TUBAL LIGATION       OB History   None      Home Medications    Prior to Admission medications   Medication Sig Start Date End Date Taking? Authorizing Provider  albuterol (PROVENTIL) (2.5 MG/3ML) 0.083% nebulizer solution Take 3 mLs (2.5 mg total) by nebulization every 6 (six) hours as needed for wheezing or shortness of breath. J96.01 02/20/17   Sharion Balloon, FNP  apixaban (ELIQUIS) 5 MG TABS tablet Take 1 tablet (5 mg total) by mouth 2 (two) times daily. Resume 05/23/17 05/19/17   Chanetta Marshall K, NP  azelastine (ASTELIN) 0.1 % nasal spray PLACE 2 SPRAYS INTO BOTH NOSTRILS 2 (TWO) TIMES DAILY AS NEEDED. 01/14/17   Evelina Dun A, FNP  calcium citrate (CALCITRATE - DOSED IN MG ELEMENTAL CALCIUM) 950 MG tablet Take 1 tablet by mouth 2 (two) times daily.     [provider]  Cholecalciferol 2000 units CAPS Take 2,000 Units by mouth daily.     [provider]  conjugated estrogens (PREMARIN) vaginal cream Place 0.5 g vaginally as needed. Weekly prn.  Use as directed    [provider]  cyanocobalamin 1000 MCG tablet Take 1,000 mcg by mouth daily.     [provider]  DEXILANT 60 MG capsule Take 60 mg by mouth daily. 12/18/16   [provider]  diazepam (VALIUM) 5 MG tablet TAKE 1 TABLET BY MOUTH TWICE A DAY AS NEEDED FOR ANXIETY 09/15/17   Hawks, Christy A, FNP  diltiazem (CARDIZEM CD) 180 MG 24 hr capsule TAKE 1 CAPSULE  BY MOUTH TWICE A DAY 04/22/17   Dettinger, Fransisca Kaufmann, MD  docusate sodium (COLACE) 100 MG capsule Take 200 mg by mouth daily as needed for mild constipation.    [provider]  EPINEPHrine 0.3 mg/0.3 mL IJ SOAJ injection Inject 0.3 mg into the muscle once as needed (For anaphylaxis.).    [provider]  FeFum-FePoly-FA-B Cmp-C-Biot (FOLIVANE-PLUS) CAPS Take 1 capsule by mouth 2 (two) times daily. 02/03/17   [provider]  fluticasone (FLONASE) 50 MCG/ACT nasal spray PLACE 1 SPRAY INTO BOTH NOSTRILS 2 (TWO) TIMES DAILY AS NEEDED FOR ALLERGIES OR RHINITIS. Patient taking differently: Place 1 spray into both nostrils daily.  04/22/17   Dettinger, Fransisca Kaufmann, MD  furosemide (LASIX) 40 MG  tablet Take 40 mg ( 1 Tablet) Alternating with 60 mg ( 1 1/2 Tablet) Daily Patient taking differently: Take 40-60 mg by mouth See admin instructions. Take 40 mg ( 1 Tablet) Alternating with 60 mg ( 1 1/2 Tablet) Daily 02/09/17   Arnoldo Lenis, MD  hydrocortisone 2.5 % cream Applied to affected areas 2 times per day for 1 week.  Do not apply to face. 06/02/17   Gottschalk, Ashly M, DO  INCRUSE ELLIPTA 62.5 MCG/INH AEPB TAKE 1 PUFF BY MOUTH EVERY DAY 10/31/16   [provider]  levocetirizine (XYZAL) 5 MG tablet Take 5 mg by mouth every evening.  07/16/15   [provider]  levothyroxine (SYNTHROID, LEVOTHROID) 100 MCG tablet TAKE 1 TABLET (100 MCG TOTAL) BY MOUTH DAILY BEFORE BREAKFAST. 08/21/17   Hawks, Christy A, FNP  metoprolol tartrate (LOPRESSOR) 25 MG tablet TAKE 0.5 TABLETS (12.5 MG TOTAL) BY MOUTH 2 (TWO) TIMES DAILY. 08/25/17 11/23/17  Evans Lance, MD  ondansetron (ZOFRAN) 8 MG tablet Take 8 mg by mouth every 8 (eight) hours as needed for nausea or vomiting.    [provider]  OVER THE COUNTER MEDICATION Place 1 drop into both eyes as needed (For dry eyes.). Systane Gel Drops    [provider]  PATADAY 0.2 % SOLN Place 1 drop into both eyes daily.  06/10/12   [provider]  polyethylene glycol powder (GLYCOLAX/MIRALAX) powder TAKE 17 G BY MOUTH 2 (TWO) TIMES DAILY AS NEEDED. 01/19/17   Evelina Dun A, FNP  predniSONE (DELTASONE) 10 MG tablet Take 10 mg by mouth daily with breakfast.    [provider]  rosuvastatin (CRESTOR) 10 MG tablet TAKE 1 TABLET BY MOUTH EVERYDAY AT BEDTIME 06/15/17   Evelina Dun A, FNP  spironolactone (ALDACTONE) 25 MG tablet TAKE 1 TABLET BY MOUTH EVERY DAY 06/15/17   Evelina Dun A, FNP  traMADol (ULTRAM) 50 MG tablet Take 1 tablet (50 mg total) by mouth every 6 (six) hours as needed. 09/03/17   Hawks, Alyse Low A, FNP  VIGAMOX 0.5 % ophthalmic solution Apply 1 drop to eye as needed (after eye injection).  08/30/15   [provider]  vitamin C (ASCORBIC ACID) 500 MG tablet Take 1,000 mg by mouth daily.     [provider]  Vitamin D, Ergocalciferol, (DRISDOL) 50000 units CAPS capsule TAKE 1 CAPSULE (50,000 UNITS TOTAL) BY MOUTH EVERY 7 (SEVEN) DAYS. 08/10/17   Evelina Dun A, FNP  vitamin E 400 UNIT capsule Take 400 Units by mouth daily.    [provider]    Family History Family History  Problem Relation Age of Onset  . Diabetes Unknown   . Hypertension Unknown   . Coronary artery disease Unknown   . Stroke Unknown   . Asthma Unknown   . Kidney disease Sister   . Cancer Sister        lung  . Lung cancer Sister   . Cancer Sister        lung  . Lung cancer Sister   . Cancer Sister        lung  . Suicidality Son        committed suicide in 2009  . CVA Son   . Hyperlipidemia Unknown   . Anxiety disorder Unknown   . Migraines Unknown   . Heart failure Father   . Healthy Brother   . Cancer Sister        lung  . Healthy Sister  Social History Social History   Tobacco Use  . Smoking status: Never Smoker  . Smokeless tobacco: Never Used  Substance Use Topics  . Alcohol use: No  . Drug use: No     Allergies   Allopurinol and Mobic  [meloxicam]   Review of Systems Review of Systems ROS: Statement: All systems negative except as marked or noted in the HPI; Constitutional: Negative for fever and chills. ; ; Eyes: Negative for eye pain, redness and discharge. ; ; ENMT: Negative for ear pain, hoarseness, nasal congestion, sinus pressure and sore throat. ; ; Cardiovascular: Negative for chest pain, palpitations, diaphoresis, dyspnea and peripheral edema. ; ; Respiratory: Negative for cough, wheezing and stridor. ; ; Gastrointestinal: +abd pain. Negative for nausea, vomiting, diarrhea, blood in stool, hematemesis, jaundice and rectal bleeding. . ; ; Genitourinary: Negative for dysuria, flank pain and hematuria. ; ; Musculoskeletal: Negative for back pain and neck pain. Negative for swelling and trauma.; ; Skin: Negative for pruritus, rash, abrasions, blisters, bruising and skin lesion.; ; Neuro: Negative for headache, lightheadedness and neck stiffness. Negative for weakness, altered level of consciousness, altered mental status, extremity weakness, paresthesias, involuntary movement, seizure and syncope.      Physical Exam Updated Vital Signs BP 140/76 (BP Location: Right Arm)   Pulse 100   Temp 98.6 F (37 C) (Oral)   Resp 18   Wt 121.1 kg (267 lb)   SpO2 97%   BMI 41.82 kg/m   Physical Exam 1820: Physical examination:  Nursing notes reviewed; Vital signs and O2 SAT reviewed;  Constitutional: Well developed, Well nourished, Well hydrated, In no acute distress; Head:  Normocephalic, atraumatic; Eyes: EOMI, PERRL, No scleral icterus; ENMT: Mouth and pharynx normal, Mucous membranes moist; Neck: Supple, Full range of motion, No lymphadenopathy; Cardiovascular: Regular rate and rhythm, No gallop; Respiratory: Breath sounds clear & equal bilaterally, No wheezes.  Speaking full sentences with ease, Normal respiratory effort/excursion; Chest: Nontender, Movement normal; Abdomen: Soft, Obese. +mild diffuse tenderness to palp.  Nondistended, Normal bowel sounds; Genitourinary: No CVA tenderness; Extremities: Peripheral pulses normal, No tenderness, +tr pedal edema bilat. No calf asymmetry.; Neuro: AA&Ox3, Major CN grossly intact.  Speech clear. No gross focal motor or sensory deficits in extremities.; Skin: Color normal, Warm, Dry.   ED Treatments / Results  Labs (all labs ordered are listed, but only abnormal results are displayed)   EKG None  Radiology   Procedures Procedures (including critical care time)  Medications Ordered in ED Medications  famotidine (PEPCID) IVPB 20 mg premix (has no administration in time range)     Initial Impression / Assessment and Plan / ED Course  I have reviewed the triage vital signs and the nursing notes.  Pertinent labs & imaging results that were available during my care of the patient were reviewed by me and considered in my medical decision making (see chart for details).  MDM Reviewed: previous chart, nursing note and vitals Reviewed previous: labs Interpretation: labs, x-ray and CT scan   Results for orders placed or performed during the hospital encounter of 09/28/17  Lipase, blood  Result Value Ref Range   Lipase 36 11 - 51 U/L  Comprehensive metabolic panel  Result Value Ref Range   Sodium 140 135 - 145 mmol/L   Potassium 4.5 3.5 - 5.1 mmol/L   Chloride 99 (L) 101 - 111 mmol/L   CO2 29 22 - 32 mmol/L   Glucose, Bld 230 (H) 65 - 99 mg/dL   BUN 22 (H)  6 - 20 mg/dL   Creatinine, Ser 1.43 (H) 0.44 - 1.00 mg/dL   Calcium 9.9 8.9 - 10.3 mg/dL   Total Protein 7.7 6.5 - 8.1 g/dL   Albumin 4.0 3.5 - 5.0 g/dL   AST 35 15 - 41 U/L   ALT 31 14 - 54 U/L   Alkaline Phosphatase 60 38 - 126 U/L   Total Bilirubin 0.7 0.3 - 1.2 mg/dL   GFR calc non Af Amer 34 (L) >60 mL/min   GFR calc Af Amer 40 (L) >60 mL/min   Anion gap 12 5 - 15  CBC  Result Value Ref Range   WBC 13.2 (H) 4.0 - 10.5 K/uL   RBC 4.54 3.87 - 5.11 MIL/uL   Hemoglobin 13.7 12.0 - 15.0 g/dL    HCT 42.8 36.0 - 46.0 %   MCV 94.3 78.0 - 100.0 fL   MCH 30.2 26.0 - 34.0 pg   MCHC 32.0 30.0 - 36.0 g/dL   RDW 13.5 11.5 - 15.5 %   Platelets 293 150 - 400 K/uL  Urinalysis, Routine w reflex microscopic  Result Value Ref Range   Color, Urine STRAW (A) YELLOW   APPearance CLEAR CLEAR   Specific Gravity, Urine 1.008 1.005 - 1.030   pH 7.0 5.0 - 8.0   Glucose, UA NEGATIVE NEGATIVE mg/dL   Hgb urine dipstick NEGATIVE NEGATIVE   Bilirubin Urine NEGATIVE NEGATIVE   Ketones, ur NEGATIVE NEGATIVE mg/dL   Protein, ur NEGATIVE NEGATIVE mg/dL   Nitrite NEGATIVE NEGATIVE   Leukocytes, UA NEGATIVE NEGATIVE  Differential  Result Value Ref Range   Neutrophils Relative % 82 %   Neutro Abs 10.4 (H) 1.7 - 7.7 K/uL   Lymphocytes Relative 14 %   Lymphs Abs 1.8 0.7 - 4.0 K/uL   Monocytes Relative 4 %   Monocytes Absolute 0.5 0.1 - 1.0 K/uL   Eosinophils Relative 0 %   Eosinophils Absolute 0.0 0.0 - 0.7 K/uL   Basophils Relative 0 %   Basophils Absolute 0.0 0.0 - 0.1 K/uL    Ct Abdomen Pelvis Wo Contrast Result Date: 09/28/2017 CLINICAL DATA:  Abdominal pain EXAM: CT ABDOMEN AND PELVIS WITHOUT CONTRAST TECHNIQUE: Multidetector CT imaging of the abdomen and pelvis was performed following the standard protocol without IV contrast. COMPARISON:  CT abdomen pelvis 01/12/2015 FINDINGS: Lower chest: Mild scarring left lung base. No infiltrate or effusion. Mild coronary calcification.  Cardiac pacemaker. Hepatobiliary: No focal liver lesion. Gallbladder and bile ducts normal Pancreas: Fatty changes of the pancreas.  No edema or mass. Spleen: Negative Adrenals/Urinary Tract: Left nephrectomy. Right kidney normal. Urinary bladder normal. Stomach/Bowel: Negative for bowel obstruction. Negative for bowel mass or edema. Sigmoid diverticulosis. Vascular/Lymphatic: Atherosclerotic calcification without aortic aneurysm. No adenopathy Reproductive: Hysterectomy.  No pelvic mass lesion. Other: No free fluid  Musculoskeletal: Lumbar fusion L4 through S1. Disc degeneration L3-4. No acute skeletal abnormality. IMPRESSION: No cause for acute abdominal pain identified. Sigmoid diverticulosis. Mild coronary calcification. Left nephrectomy for kidney donation. Electronically Signed   By: Franchot Gallo M.D.   On: 09/28/2017 19:52   Dg Chest 2 View Result Date: 09/28/2017 CLINICAL DATA:  Mid chest pain radiating both sides last evening. EXAM: CHEST - 2 VIEW COMPARISON:  05/19/2017 FINDINGS: Stable heart and mediastinal contours. Mild aortic atherosclerosis at the arch without aneurysm. Left-sided pacemaker apparatus with leads projecting over the right ventricle appear stable. No pulmonary consolidation, effusion or pneumothorax. Trace fluid or thickening along the minor fissure. Osteoarthritis of the right [facility]  joint. No acute osseous abnormality. IMPRESSION: No active cardiopulmonary disease. Electronically Signed   By: Ashley Royalty M.D.   On: 09/28/2017 19:34    Results for [employer], SETTLE (MRN [id number]) as of 09/28/2017 18:28  Ref. Range 05/11/2017 10:20 05/14/2017 15:51 08/26/2017 15:29 09/28/2017 17:04  BUN Latest Ref Range: 6 - 20 mg/dL 14 18 22 22  (H)  Creatinine Latest Ref Range: 0.44 - 1.00 mg/dL 1.14 (H) 1.06 (H) 1.25 (H) 1.43 (H)    2005:  Pt has tol PO well while in the ED without N/V.  No stooling while in the ED.  Abd benign, VSS. Feels better and wants to go home now. Tx symptomatically, f/u GI MD and PMD. Dx and testing d/w pt and family.  Questions answered.  Verb understanding, agreeable to d/c home with outpt f/u.            Final Clinical Impressions(s) / ED Diagnoses   Final diagnoses:  None    ED Discharge Orders    None       Francine Graven, DO 10/03/17 1624

## 2017-09-30 ENCOUNTER — Other Ambulatory Visit: Payer: Self-pay | Admitting: Family Medicine

## 2017-09-30 LAB — URINE CULTURE: Culture: NO GROWTH

## 2017-10-01 ENCOUNTER — Telehealth: Payer: Self-pay | Admitting: Family

## 2017-10-01 ENCOUNTER — Other Ambulatory Visit: Payer: Self-pay | Admitting: Family

## 2017-10-01 MED ORDER — CEPHALEXIN 500 MG PO CAPS: 500.0000 mg | ORAL_CAPSULE | Freq: Two times a day (BID) | ORAL | 0 refills | Status: DC

## 2017-10-01 NOTE — Telephone Encounter (Signed)
Pt aware.

## 2017-10-01 NOTE — Telephone Encounter (Signed)
Keflex Prescription sent to pharmacy

## 2017-10-12 ENCOUNTER — Telehealth: Payer: Self-pay | Admitting: Family

## 2017-10-12 NOTE — Telephone Encounter (Signed)
Patient aware.

## 2017-10-12 NOTE — Telephone Encounter (Signed)
It looks like she has only had two UTI's this year. It would be best to just treat these as they occur since she is not getting them frequently. She should also try to force fluids, wiping front to back, and emptying bladder frequently.

## 2017-10-14 ENCOUNTER — Other Ambulatory Visit: Payer: Self-pay | Admitting: Family Medicine

## 2017-10-17 ENCOUNTER — Telehealth: Payer: Self-pay | Admitting: Family

## 2017-10-17 MED ORDER — CIPROFLOXACIN HCL 500 MG PO TABS: 500.0000 mg | ORAL_TABLET | Freq: Two times a day (BID) | ORAL | 0 refills | Status: DC

## 2017-10-17 NOTE — Telephone Encounter (Signed)
lmtcb

## 2017-10-17 NOTE — Addendum Note (Signed)
Addended by: Chevis Pretty on: 10/17/2017 09:03 AM   Modules accepted: Orders

## 2017-10-17 NOTE — Telephone Encounter (Signed)
Patient states she has been having dysuria since waking up this morning.  States she knows she has a UTI and requesting abx [facility] to [facility] [place].  Advised patient to do E vist or come in to be seen.  Patient states she can not come in that it is hard for her to get around and she does not have a computer to do E visit.  Per MMM abx will be sent in but in the future patient must be seen.  Patient aware and verbalizes understanding.

## 2017-10-26 ENCOUNTER — Other Ambulatory Visit: Payer: Self-pay | Admitting: Family

## 2017-10-27 NOTE — Telephone Encounter (Signed)
10/19/15  26.8   Last Vit D

## 2017-10-29 ENCOUNTER — Encounter (INDEPENDENT_AMBULATORY_CARE_PROVIDER_SITE_OTHER): Payer: Medicare Other | Admitting: Ophthalmology

## 2017-10-29 ENCOUNTER — Other Ambulatory Visit: Payer: Self-pay | Admitting: Family Medicine

## 2017-10-29 DIAGNOSIS — H35033 Hypertensive retinopathy, bilateral: Secondary | ICD-10-CM | POA: Diagnosis not present

## 2017-10-29 DIAGNOSIS — E113293 Type 2 diabetes mellitus with mild nonproliferative diabetic retinopathy without macular edema, bilateral: Secondary | ICD-10-CM | POA: Diagnosis not present

## 2017-10-29 DIAGNOSIS — H43813 Vitreous degeneration, bilateral: Secondary | ICD-10-CM

## 2017-10-29 DIAGNOSIS — H353231 Exudative age-related macular degeneration, bilateral, with active choroidal neovascularization: Secondary | ICD-10-CM | POA: Diagnosis not present

## 2017-10-29 DIAGNOSIS — I10 Essential (primary) hypertension: Secondary | ICD-10-CM | POA: Diagnosis not present

## 2017-10-29 DIAGNOSIS — H26492 Other secondary cataract, left eye: Secondary | ICD-10-CM

## 2017-10-29 DIAGNOSIS — J019 Acute sinusitis, unspecified: Secondary | ICD-10-CM

## 2017-10-29 DIAGNOSIS — E11319 Type 2 diabetes mellitus with unspecified diabetic retinopathy without macular edema: Secondary | ICD-10-CM | POA: Diagnosis not present

## 2017-11-06 ENCOUNTER — Encounter (INDEPENDENT_AMBULATORY_CARE_PROVIDER_SITE_OTHER): Payer: Medicare Other | Admitting: Ophthalmology

## 2017-11-06 DIAGNOSIS — H2702 Aphakia, left eye: Secondary | ICD-10-CM

## 2017-11-11 ENCOUNTER — Encounter: Payer: Self-pay | Admitting: Emergency Medicine

## 2017-11-11 ENCOUNTER — Ambulatory Visit (INDEPENDENT_AMBULATORY_CARE_PROVIDER_SITE_OTHER): Payer: Medicare Other | Admitting: Emergency Medicine

## 2017-11-11 DIAGNOSIS — J849 Interstitial pulmonary disease, unspecified: Secondary | ICD-10-CM

## 2017-11-11 DIAGNOSIS — J449 Chronic obstructive pulmonary disease, unspecified: Secondary | ICD-10-CM | POA: Diagnosis not present

## 2017-11-11 DIAGNOSIS — J8410 Pulmonary fibrosis, unspecified: Secondary | ICD-10-CM | POA: Diagnosis not present

## 2017-11-11 NOTE — Assessment & Plan Note (Signed)
Multifactorial chronic hypoxic respiratory failure due to ILD, obstructive lung disease, obesity/hypoventilation, atrial fibrillation, probable deconditioning.  Note that she likely also has undiagnosed obstructive sleep apnea.  Continue her current oxygen at 3 L/min at all times.  She would benefit from pulmonary rehab.  We discussed this today and she is going to consider it.  If she were to get it she would likely get it in [facility].

## 2017-11-11 NOTE — Assessment & Plan Note (Signed)
Believe that she does have some degree of obstruction based on her spirometry and her clinical response to Incruse.  We will continue this, continue her albuterol nebulizers as needed.

## 2017-11-11 NOTE — Assessment & Plan Note (Signed)
Questionable diagnosis rheumatoid arthritis, remains on prednisone 10 mg daily.  We will plan to repeat her CT chest in December.  She has some mild but definitely slowly progressive interstitial changes.  I tried to reassure her about the degree of disease today.  Based on this and the potential side effects of anti-fibrotic therapy we have never pursued.  We may need to revisit depending on her next CT.

## 2017-11-11 NOTE — Progress Notes (Signed)
Subjective:    Patient ID: Lorraine Leonard, female    DOB: 1940-06-15, 77 y.o.   MRN: [id number]  HPI  01/23/17 -- this is a follow-up visit for patient to her history of a pulmonary nodule in interstitial lung disease seen most prominently in the left lower lobe. She also has A Fib, HTN and diastolic CHF. At one time it was felt she may possibly have rheumatoid arthritis although this is no longer felt to be the case. 11/23/15 she had an echocardiogram that showed normal pulmonary pressures. A repeat echocardiogram 09/24/16 was performed and again showed normal PA pressure with an estimated peak pressure of 25 mmHg. We have followed her interstitial lung disease and pulmonary nodule with serial CT scans. She had an interval scan 09/23/16 which she was seen in the emergency department for dyspnea. This showed no evidence of pulmonary embolus, bilateral lower lobe interstitial prominence slightly more prominent than in 10/2014.    She describes increase in her cough, SOB, weakness. Was admitted in June and July for diuresis. Was noted to be hypoxemic and was started on O2 > wearing 3L/min, sleeps with it. She is under eval for a possible DCCV, but this was postponed due to anemia and a positive FOBT. She snores a little, still fatigued when she wakes up. She has refused eval for OSA in the past.   ROV 03/26/17 --patient follows up today for her history of interstitial lung disease, dyspnea, hypoxemic respiratory failure.  He also has suspected obstructive sleep apnea although the diagnosis has not been established.  We repeated her CT scan of the chest on 03/23/17 which does show progression of interstitial disease in the bases and scattered subpleural lymph nodes.  There is been slight progression of the interstitial disease compared with 11/17/14.  She underwent pulmonary function testing today which I have reviewed.  This is consistent with restrictive lung disease, possible superimposed obstruction based on a normal  FEV1 to FVC ratio.  Her lung volumes are restricted her diffusion capacity is decreased but corrects to the normal range when adjusted for her alveolar volume.  She remains on prednisone 10 mg daily. She failed DCCV for her A fib. She is on Incruse, minimal albuterol use.   ROV 11/11/17 --77 year old woman with interstitial lung disease and hypoxemic respiratory failure, possible superimposed obstruction on pulmonary function testing.  She is been managed with Incruse for this and believes that she has benefited.  She is on prednisone 10 mg daily for presumed/possible rheumatoid arthritis although this diagnosis is not ever been entirely clear.  She is using oxygen at 3 L/min pulsed.  Sleep apnea has been suspected but she has not wanted to have a sleep study to confirm it. She gets SOB with minimal exertion, with even short walks. She uses nebulized albuterol most days, not all.     No evidence obstruction on spirometry.  PULMONARY FUNCTON TEST 02/03/2012  FVC 2.91  FEV1 2.1  FEV1/FVC 72.2  FVC  % Predicted 89  FEV % Predicted 90  FeF 25-75 1.45  FeF 25-75 % Predicted 2.5        Objective:   Physical Exam Vitals:   11/11/17 1352  BP: 124/90  Pulse: 72  SpO2: 97%  Weight: 272 lb 6.4 oz (123.6 kg)  Height: 5\' 7"  (1.702 m)   Gen: Pleasant, obese woman, in no distress,  normal affect, wheelchair  ENT: No lesions,  mouth clear,  oropharynx clear, no postnasal drip  Neck: No JVD,  no stridor  Lungs: No use of accessory muscles, few insp crackles L base  Cardiovascular: RRR, heart sounds normal, no murmur or gallops, no peripheral edema  Musculoskeletal: No deformities, no cyanosis or clubbing  Neuro: alert, non focal  Skin: Warm, no lesions or rashes  High-resolution CT chest 03/23/17 --  COMPARISON:  09/23/2016, 11/17/2014, 12/09/2011 and 01/12/2007.  FINDINGS: Cardiovascular: Atherosclerotic calcification of the arterial vasculature, including coronary arteries. Pulmonary  arteries and heart are enlarged. No pericardial effusion.  Mediastinum/Nodes: Mediastinal lymph nodes are not enlarged by CT size criteria. Hilar regions are difficult to evaluate without IV contrast. No axillary adenopathy. Esophagus is grossly unremarkable.  Lungs/Pleura: Subpleural reticulation and traction bronchiectasis/bronchiolectasis appear slightly basilar predominant. Findings are progressive from 11/17/2014. There may be honeycombing at the left lung base. Scattered subpleural lymph nodes at the lung bases are unchanged over multiple prior exams. No air trapping at the lung bases. No pleural fluid. Airway is unremarkable.  Upper Abdomen: Visualized portions of the liver, gallbladder, adrenal glands, right kidney, spleen, pancreas, stomach and bowel are grossly unremarkable. No upper abdominal adenopathy.  Musculoskeletal: Degenerative changes in the spine.  IMPRESSION: 1. Pulmonary parenchymal pattern of fibrosis appears progressive from 11/17/2014 and is worrisome for usual interstitial pneumonitis. Fibrotic nonspecific interstitial pneumonitis is another consideration. 2. Aortic atherosclerosis (ICD10-170.0). Coronary artery calcification. 3. Enlarged pulmonary arteries, indicative of pulmonary arterial hypertension     Assessment & Plan:  COPD (chronic obstructive pulmonary disease) ([place]) Believe that she does have some degree of obstruction based on her spirometry and her clinical response to Incruse.  We will continue this, continue her albuterol nebulizers as needed.  Chronic respiratory failure with hypoxia (HCC) Multifactorial chronic hypoxic respiratory failure due to ILD, obstructive lung disease, obesity/hypoventilation, atrial fibrillation, probable deconditioning.  Note that she likely also has undiagnosed obstructive sleep apnea.  Continue her current oxygen at 3 L/min at all times.  She would benefit from pulmonary rehab.  We discussed this today and  she is going to consider it.  If she were to get it she would likely get it in [facility].  Pulmonary fibrosis (HCC) Questionable diagnosis rheumatoid arthritis, remains on prednisone 10 mg daily.  We will plan to repeat her CT chest in December.  She has some mild but definitely slowly progressive interstitial changes.  I tried to reassure her about the degree of disease today.  Based on this and the potential side effects of anti-fibrotic therapy we have never pursued.  We may need to revisit depending on her next CT.  Baltazar Apo, MD, PhD 11/11/2017, 2:25 PM [facility] [phone] or if no answer [phone]

## 2017-11-11 NOTE — Patient Instructions (Signed)
Please continue prednisone 10 mg daily. Please continue your Incruse as you have been taking it. Continue your oxygen at 3 L/min at all times. We will repeat your CT scan of the chest with high-resolution cuts, no contrast to follow your interstitial lung disease in December 2019. Follow with Dr Lamonte Sakai in December after your Ct scan to review the results together

## 2017-11-12 ENCOUNTER — Encounter: Payer: Self-pay | Admitting: Family

## 2017-11-12 ENCOUNTER — Ambulatory Visit (INDEPENDENT_AMBULATORY_CARE_PROVIDER_SITE_OTHER): Payer: Medicare Other

## 2017-11-12 ENCOUNTER — Ambulatory Visit (INDEPENDENT_AMBULATORY_CARE_PROVIDER_SITE_OTHER): Payer: Medicare Other | Admitting: Family

## 2017-11-12 VITALS — BP 127/62 | HR 99 | Temp 97.1°F | Ht 67.0 in | Wt 271.6 lb

## 2017-11-12 DIAGNOSIS — G8929 Other chronic pain: Secondary | ICD-10-CM

## 2017-11-12 DIAGNOSIS — M25551 Pain in right hip: Secondary | ICD-10-CM | POA: Diagnosis not present

## 2017-11-12 DIAGNOSIS — M5440 Lumbago with sciatica, unspecified side: Secondary | ICD-10-CM | POA: Diagnosis not present

## 2017-11-12 DIAGNOSIS — M1611 Unilateral primary osteoarthritis, right hip: Secondary | ICD-10-CM

## 2017-11-12 NOTE — Progress Notes (Signed)
   Subjective:    Patient ID: Lorraine Leonard, female    DOB: [birth year], 77 y.o.   MRN: [id number]  Chief Complaint  Patient presents with  . right hip and upper leg pain    Hip Pain   The incident occurred more than 1 week ago. There was no injury mechanism. The pain is present in the right hip (lower back). The quality of the pain is described as aching. The pain is at a severity of 10/10. The pain is moderate. The pain has been intermittent since onset. Associated symptoms include numbness and tingling. She reports no foreign bodies present. Exacerbated by: standing. She has tried rest (ultram) for the symptoms. The treatment provided mild relief.      Review of Systems  Neurological: Positive for tingling and numbness.  All other systems reviewed and are negative.      Objective:   Physical Exam  Constitutional: She is oriented to person, place, and time. She appears well-developed and well-nourished. No distress.  HENT:  Head: Normocephalic.  Eyes: Pupils are equal, round, and reactive to light.  Neck: Normal range of motion. Neck supple. No thyromegaly present.  Cardiovascular: Normal rate, regular rhythm, normal heart sounds and intact distal pulses.  No murmur heard. Pulmonary/Chest: Effort normal and breath sounds normal. No respiratory distress. She has no wheezes.  3L O2 continuous   Abdominal: Soft. Bowel sounds are normal. She exhibits no distension. There is no tenderness.  Musculoskeletal: Normal range of motion. She exhibits tenderness. She exhibits no edema.  Pain in right hip with external rotation  Neurological: She is alert and oriented to person, place, and time. She has normal reflexes. No cranial nerve deficit.  Skin: Skin is warm and dry.  Psychiatric: She has a normal mood and affect. Her behavior is normal. Judgment and thought content normal.  Vitals reviewed.    BP 127/62   Pulse 99   Temp (!) 97.1 F (36.2 C) (Oral)   Ht 5\' 7"  (1.702 m)   Wt  271 lb 9.6 oz (123.2 kg)   BMI 42.54 kg/m      Assessment & Plan:  Lorraine Leonard comes in today with chief complaint of right hip and upper leg pain   Diagnosis and orders addressed:  1. Pain of right hip joint - DG HIP UNILAT W OR W/O PELVIS 2-3 VIEWS RIGHT; Future - Ambulatory referral to Orthopedic Surgery  2. Chronic bilateral low back pain with sciatica, sciatica laterality unspecified - DG HIP UNILAT W OR W/O PELVIS 2-3 VIEWS RIGHT; Future - Ambulatory referral to Orthopedic Surgery  3. Primary osteoarthritis of right hip - Ambulatory referral to Orthopedic Surgery   Rest Ice  ROM exercises discussed Ortho referral pending Continue Ultram as needed RTO as needed and keep chronic follow up  Evelina Dun, FNP

## 2017-11-12 NOTE — Patient Instructions (Signed)

## 2017-11-28 ENCOUNTER — Other Ambulatory Visit: Payer: Self-pay | Admitting: Family

## 2017-11-28 DIAGNOSIS — R110 Nausea: Secondary | ICD-10-CM

## 2017-11-29 ENCOUNTER — Other Ambulatory Visit: Payer: Self-pay | Admitting: Family

## 2017-11-30 NOTE — Telephone Encounter (Signed)
Last lipid 05/14/17

## 2017-11-30 NOTE — Telephone Encounter (Signed)
Last seen 11/12/17  [facility]

## 2017-12-02 ENCOUNTER — Other Ambulatory Visit: Payer: Self-pay | Admitting: Gastroenterology

## 2017-12-02 DIAGNOSIS — R1084 Generalized abdominal pain: Secondary | ICD-10-CM

## 2017-12-02 DIAGNOSIS — R1310 Dysphagia, unspecified: Secondary | ICD-10-CM

## 2017-12-02 DIAGNOSIS — R1319 Other dysphagia: Secondary | ICD-10-CM

## 2017-12-02 MED ORDER — LEVOCETIRIZINE DIHYDROCHLORIDE 5 MG PO TABS: 5.0000 mg | ORAL_TABLET | Freq: Every evening | ORAL | 0 refills | Status: DC

## 2017-12-07 ENCOUNTER — Ambulatory Visit (INDEPENDENT_AMBULATORY_CARE_PROVIDER_SITE_OTHER): Payer: Medicare Other | Admitting: *Deleted

## 2017-12-07 ENCOUNTER — Other Ambulatory Visit: Payer: Medicare Other

## 2017-12-07 DIAGNOSIS — I4891 Unspecified atrial fibrillation: Secondary | ICD-10-CM

## 2017-12-07 NOTE — Progress Notes (Signed)
Remote pacemaker transmission.   

## 2017-12-08 ENCOUNTER — Other Ambulatory Visit: Payer: Medicare Other

## 2017-12-08 ENCOUNTER — Other Ambulatory Visit: Payer: Self-pay | Admitting: Family

## 2017-12-10 DIAGNOSIS — M25551 Pain in right hip: Secondary | ICD-10-CM | POA: Diagnosis not present

## 2017-12-10 DIAGNOSIS — M1611 Unilateral primary osteoarthritis, right hip: Secondary | ICD-10-CM | POA: Diagnosis not present

## 2017-12-15 ENCOUNTER — Ambulatory Visit (HOSPITAL_COMMUNITY)
Admission: RE | Admit: 2017-12-15 | Discharge: 2017-12-15 | Disposition: A | Discharge status: Home / Self Care | Payer: Medicare Other | Source: Ambulatory Visit | Attending: Gastroenterology | Admitting: Gastroenterology

## 2017-12-15 DIAGNOSIS — K760 Fatty (change of) liver, not elsewhere classified: Secondary | ICD-10-CM | POA: Insufficient documentation

## 2017-12-15 DIAGNOSIS — R1084 Generalized abdominal pain: Secondary | ICD-10-CM

## 2017-12-15 DIAGNOSIS — K5730 Diverticulosis of large intestine without perforation or abscess without bleeding: Secondary | ICD-10-CM | POA: Insufficient documentation

## 2017-12-15 LAB — POCT I-STAT CREATININE: Creatinine, Ser: 1.1 mg/dL — ABNORMAL HIGH (ref 0.44–1.00)

## 2017-12-15 MED ORDER — IOPAMIDOL (ISOVUE-300) INJECTION 61%
100.0000 mL | Freq: Once | INTRAVENOUS | Status: AC | PRN
  Administered 2017-12-15: 100 mL via INTRAVENOUS

## 2017-12-16 ENCOUNTER — Ambulatory Visit (HOSPITAL_COMMUNITY)
Admission: RE | Admit: 2017-12-16 | Discharge: 2017-12-16 | Disposition: A | Discharge status: Home / Self Care | Payer: Medicare Other | Source: Ambulatory Visit | Attending: Gastroenterology | Admitting: Gastroenterology

## 2017-12-16 DIAGNOSIS — R1310 Dysphagia, unspecified: Secondary | ICD-10-CM | POA: Diagnosis not present

## 2017-12-16 DIAGNOSIS — R1084 Generalized abdominal pain: Secondary | ICD-10-CM | POA: Insufficient documentation

## 2017-12-16 DIAGNOSIS — R1319 Other dysphagia: Secondary | ICD-10-CM

## 2018-01-01 LAB — CUP PACEART REMOTE DEVICE CHECK
Battery Remaining Longevity: 135 mo
Battery Voltage: 3.1 V
Brady Statistic AP VP Percent: 9.58 %
Brady Statistic AP VS Percent: 0 %
Brady Statistic AS VP Percent: 0 %
Brady Statistic AS VS Percent: 90.42 %
Brady Statistic RA Percent Paced: 56.01 %
Brady Statistic RV Percent Paced: 9.58 %
Date Time Interrogation Session: 20190819065015
Implantable Lead Implant Date: 20190128
Implantable Lead Implant Date: 20190128
Implantable Lead Location: 753859
Implantable Lead Location: 753860
Implantable Lead Model: 3830
Implantable Lead Model: 5076
Implantable Pulse Generator Implant Date: 20190128
Lead Channel Impedance Value: 304 Ohm
Lead Channel Impedance Value: 399 Ohm
Lead Channel Impedance Value: 399 Ohm
Lead Channel Impedance Value: 627 Ohm
Lead Channel Sensing Intrinsic Amplitude: 12.5 mV
Lead Channel Sensing Intrinsic Amplitude: 12.5 mV
Lead Channel Sensing Intrinsic Amplitude: 2.625 mV
Lead Channel Sensing Intrinsic Amplitude: 2.625 mV
Lead Channel Setting Pacing Amplitude: 3.5 V
Lead Channel Setting Pacing Amplitude: 5 V
Lead Channel Setting Pacing Pulse Width: 0.4 ms
Lead Channel Setting Sensing Sensitivity: 0.9 mV

## 2018-01-10 ENCOUNTER — Other Ambulatory Visit: Payer: Self-pay | Admitting: Family

## 2018-01-18 ENCOUNTER — Other Ambulatory Visit: Payer: Self-pay | Admitting: Family

## 2018-01-18 NOTE — Telephone Encounter (Signed)
Last Vit D 10/19/15  26.8

## 2018-02-04 ENCOUNTER — Other Ambulatory Visit: Payer: Self-pay | Admitting: Family

## 2018-02-04 DIAGNOSIS — J019 Acute sinusitis, unspecified: Secondary | ICD-10-CM

## 2018-02-19 ENCOUNTER — Other Ambulatory Visit: Payer: Self-pay | Admitting: Family

## 2018-02-19 DIAGNOSIS — J019 Acute sinusitis, unspecified: Secondary | ICD-10-CM

## 2018-02-21 ENCOUNTER — Other Ambulatory Visit: Payer: Self-pay | Admitting: Family Medicine

## 2018-02-21 ENCOUNTER — Other Ambulatory Visit: Payer: Self-pay | Admitting: Family

## 2018-02-26 ENCOUNTER — Encounter: Payer: Self-pay | Admitting: Family

## 2018-02-26 ENCOUNTER — Ambulatory Visit (INDEPENDENT_AMBULATORY_CARE_PROVIDER_SITE_OTHER): Payer: Medicare Other | Admitting: Family

## 2018-02-26 VITALS — BP 141/92 | HR 89 | Temp 97.9°F | Ht 67.0 in | Wt 283.6 lb

## 2018-02-26 DIAGNOSIS — Z23 Encounter for immunization: Secondary | ICD-10-CM | POA: Diagnosis not present

## 2018-02-26 DIAGNOSIS — M79602 Pain in left arm: Secondary | ICD-10-CM

## 2018-02-26 DIAGNOSIS — J449 Chronic obstructive pulmonary disease, unspecified: Secondary | ICD-10-CM

## 2018-02-26 DIAGNOSIS — I152 Hypertension secondary to endocrine disorders: Secondary | ICD-10-CM

## 2018-02-26 DIAGNOSIS — F419 Anxiety disorder, unspecified: Secondary | ICD-10-CM | POA: Diagnosis not present

## 2018-02-26 DIAGNOSIS — I5031 Acute diastolic (congestive) heart failure: Secondary | ICD-10-CM

## 2018-02-26 DIAGNOSIS — I4891 Unspecified atrial fibrillation: Secondary | ICD-10-CM

## 2018-02-26 DIAGNOSIS — E559 Vitamin D deficiency, unspecified: Secondary | ICD-10-CM

## 2018-02-26 DIAGNOSIS — N182 Chronic kidney disease, stage 2 (mild): Secondary | ICD-10-CM | POA: Diagnosis not present

## 2018-02-26 DIAGNOSIS — M7989 Other specified soft tissue disorders: Secondary | ICD-10-CM

## 2018-02-26 DIAGNOSIS — E039 Hypothyroidism, unspecified: Secondary | ICD-10-CM

## 2018-02-26 DIAGNOSIS — E1142 Type 2 diabetes mellitus with diabetic polyneuropathy: Secondary | ICD-10-CM | POA: Diagnosis not present

## 2018-02-26 DIAGNOSIS — E785 Hyperlipidemia, unspecified: Secondary | ICD-10-CM

## 2018-02-26 DIAGNOSIS — E1169 Type 2 diabetes mellitus with other specified complication: Secondary | ICD-10-CM

## 2018-02-26 DIAGNOSIS — I517 Cardiomegaly: Secondary | ICD-10-CM

## 2018-02-26 DIAGNOSIS — I10 Essential (primary) hypertension: Secondary | ICD-10-CM

## 2018-02-26 DIAGNOSIS — F331 Major depressive disorder, recurrent, moderate: Secondary | ICD-10-CM | POA: Diagnosis not present

## 2018-02-26 DIAGNOSIS — E1159 Type 2 diabetes mellitus with other circulatory complications: Secondary | ICD-10-CM | POA: Diagnosis not present

## 2018-02-26 LAB — BAYER DCA HB A1C WAIVED: HB A1C (BAYER DCA - WAIVED): 7.4 % — ABNORMAL HIGH (ref <7.0)

## 2018-02-26 MED ORDER — FUROSEMIDE 40 MG PO TABS: 60.0000 mg | ORAL_TABLET | Freq: Every day | ORAL | 6 refills | Status: DC

## 2018-02-26 NOTE — Progress Notes (Signed)
Subjective:    Patient ID: Lorraine Leonard, female    DOB: [birth year], 77 y.o.   MRN: [id number]  Chief Complaint  Patient presents with  . Anxiety    recheck   Pt presents to the office today for chronic follow up. Pt is followed by Cardiologists every 3 months for A Fib and CHF. 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  She is followed by her Pulmonologist every 6 months for Pulmonary hypertension, COPD, and Pulmonary fibrosis. Pt is on continuously oxygen at 3 L. States her breathing is stable, but becomes short of breath with any activity.  She is complaining of left upper arm pain and swelling. She states she feels like this has been going on for 6 months-1 year but is unsure. She states she fell and it could be arthritis in her shoulder. She did have the pacemaker placed in January. She states she feels a knot in her upper arm  Hypertension  This is a chronic problem. The current episode started more than 1 year ago. The problem has been waxing and waning since onset. The problem is uncontrolled. Associated symptoms include anxiety, malaise/fatigue, peripheral edema and shortness of breath. Risk factors for coronary artery disease include diabetes mellitus, dyslipidemia, obesity and sedentary lifestyle. The current treatment provides mild improvement. Hypertensive end-organ damage includes kidney disease and heart failure. Identifiable causes of hypertension include a thyroid problem.  Congestive Heart Failure  Presents for follow-up visit. Associated symptoms include fatigue and shortness of breath.  Thyroid Problem  Presents for follow-up visit. Symptoms include anxiety, constipation, dry skin, fatigue, visual change and weight gain. Patient reports no diarrhea. The symptoms have been stable. Her past medical history is significant for heart failure and hyperlipidemia.  Arthritis  Presents for follow-up visit. She complains of pain and stiffness. Affected locations include the right hip  and left hip (back). Her pain is at a severity of 8/10. Associated symptoms include fatigue. Pertinent negatives include no diarrhea.  Hyperlipidemia  This is a chronic problem. The current episode started more than 1 year ago. The problem is controlled. Recent lipid tests were reviewed and are normal. Associated symptoms include shortness of breath. Current antihyperlipidemic treatment includes statins. The current treatment provides moderate improvement of lipids.  Anxiety  Presents for follow-up visit. Symptoms include decreased concentration, excessive worry, irritability, nervous/anxious behavior, restlessness and shortness of breath. Symptoms occur constantly. The severity of symptoms is moderate.    Depression         This is a chronic problem.  The current episode started more than 1 year ago.   The onset quality is gradual.   The problem occurs intermittently.  Associated symptoms include decreased concentration, fatigue, helplessness, hopelessness and restlessness.  Past medical history includes thyroid problem and anxiety.   Gastroesophageal Reflux  She reports no belching, no coughing or no heartburn. This is a chronic problem. Associated symptoms include fatigue. She has tried a PPI for the symptoms. The treatment provided moderate relief.      Review of Systems  Constitutional: Positive for fatigue, irritability, malaise/fatigue and weight gain.  Respiratory: Positive for shortness of breath. Negative for cough.   Gastrointestinal: Positive for constipation. Negative for diarrhea and heartburn.  Musculoskeletal: Positive for arthritis and stiffness.  Psychiatric/Behavioral: Positive for decreased concentration and depression. The patient is nervous/anxious.   All other systems reviewed and are negative.      Objective:   Physical Exam  Constitutional: She is oriented to person, place, and time.  She appears well-developed and well-nourished. No distress.  HENT:  Head:  Normocephalic and atraumatic.  Right Ear: External ear normal.  Left Ear: External ear normal.  Mouth/Throat: Oropharynx is clear and moist.  Eyes: Pupils are equal, round, and reactive to light.  Neck: Normal range of motion. Neck supple. No thyromegaly present.  Cardiovascular: Normal rate, regular rhythm, normal heart sounds and intact distal pulses.  No murmur heard. Pulmonary/Chest: Effort normal. No respiratory distress. She has decreased breath sounds. She has no wheezes.  3L O2, SOB on exertion   Abdominal: Soft. Bowel sounds are normal. She exhibits no distension. There is no tenderness.  Musculoskeletal: She exhibits edema. She exhibits no tenderness.  In wheelchair, generalized weakness. Left arm trace swelling. Mass felt in upper left arm. Lipoma?  Neurological: She is alert and oriented to person, place, and time. She has normal reflexes. No cranial nerve deficit.  Skin: Skin is warm and dry.  Psychiatric: She has a normal mood and affect. Her behavior is normal. Judgment and thought content normal.  Vitals reviewed.    BP (!) 149/93   Pulse 87   Temp 97.9 F (36.6 C) (Oral)   Ht 5' 7" (1.702 m)   Wt 283 lb 9.6 oz (128.6 kg)   BMI 44.42 kg/m      Assessment & Plan:  Lorraine Leonard comes in today with chief complaint of Anxiety (recheck)   Diagnosis and orders addressed:  1. Hypertension associated with diabetes ([place]) - CMP14+EGFR - CBC with Differential/Platelet  2. Cardiomegaly - CMP14+EGFR - CBC with Differential/Platelet  3. Acute diastolic CHF (congestive heart failure) (HCC) - CMP14+EGFR - CBC with Differential/Platelet - furosemide (LASIX) 40 MG tablet; Take 1.5 tablets (60 mg total) by mouth daily.  Dispense: 90 tablet; Refill: 6  4. Atrial fibrillation, unspecified type ([place]) - CMP14+EGFR - CBC with Differential/Platelet  5. Chronic obstructive pulmonary disease, unspecified COPD type ([place]) - CMP14+EGFR - CBC with Differential/Platelet  6.  Morbid obesity ([place]) - CMP14+EGFR - CBC with Differential/Platelet  7. Vitamin D deficiency - CMP14+EGFR - CBC with Differential/Platelet - VITAMIN D 25 Hydroxy (Vit-D Deficiency, Fractures)  8. Anxiety - CMP14+EGFR - CBC with Differential/Platelet  9. Moderate episode of recurrent major depressive disorder (HCC) - CMP14+EGFR - CBC with Differential/Platelet  10. CKD (chronic kidney disease) stage 2, GFR 60-89 ml/min - CMP14+EGFR - CBC with Differential/Platelet  11. Hypothyroidism, unspecified type - CMP14+EGFR - CBC with Differential/Platelet - TSH  12. Hyperlipidemia associated with type 2 diabetes mellitus (HCC) - CMP14+EGFR - CBC with Differential/Platelet - Lipid panel  13. Type 2 diabetes mellitus with diabetic polyneuropathy, without long-term current use of insulin (HCC) - Bayer DCA Hb A1c Waived - CMP14+EGFR - CBC with Differential/Platelet  14. Arm swelling - US Venous Img Upper Uni Left; Future  15. Pain of left upper extremity - US Venous Img Upper Uni Left; Future  16. Encounter for immunization - Flu vaccine HIGH DOSE PF   Labs pending Health Maintenance reviewed Diet and exercise encouraged  Follow up plan: 1 week for CHF, doppler pending   Evelina Dun, FNP

## 2018-02-26 NOTE — Patient Instructions (Signed)

## 2018-02-27 LAB — TSH: TSH: 6.27 u[IU]/mL — ABNORMAL HIGH (ref 0.450–4.500)

## 2018-02-27 LAB — CBC WITH DIFFERENTIAL/PLATELET
Basophils Absolute: 0.1 10*3/uL (ref 0.0–0.2)
Basos: 1 %
EOS (ABSOLUTE): 0.1 10*3/uL (ref 0.0–0.4)
Eos: 1 %
Hematocrit: 38.7 % (ref 34.0–46.6)
Hemoglobin: 12.7 g/dL (ref 11.1–15.9)
Immature Grans (Abs): 0 10*3/uL (ref 0.0–0.1)
Immature Granulocytes: 0 %
Lymphocytes Absolute: 1.6 10*3/uL (ref 0.7–3.1)
Lymphs: 15 %
MCH: 30.2 pg (ref 26.6–33.0)
MCHC: 32.8 g/dL (ref 31.5–35.7)
MCV: 92 fL (ref 79–97)
Monocytes Absolute: 0.7 10*3/uL (ref 0.1–0.9)
Monocytes: 6 %
Neutrophils Absolute: 8.5 10*3/uL — ABNORMAL HIGH (ref 1.4–7.0)
Neutrophils: 77 %
Platelets: 235 10*3/uL (ref 150–450)
RBC: 4.21 x10E6/uL (ref 3.77–5.28)
RDW: 13.1 % (ref 12.3–15.4)
WBC: 11 10*3/uL — ABNORMAL HIGH (ref 3.4–10.8)

## 2018-02-27 LAB — CMP14+EGFR
ALT: 21 IU/L (ref 0–32)
AST: 24 IU/L (ref 0–40)
Albumin/Globulin Ratio: 1.9 (ref 1.2–2.2)
Albumin: 4.5 g/dL (ref 3.5–4.8)
Alkaline Phosphatase: 65 IU/L (ref 39–117)
BUN/Creatinine Ratio: 12 (ref 12–28)
BUN: 15 mg/dL (ref 8–27)
Bilirubin Total: 0.5 mg/dL (ref 0.0–1.2)
CO2: 31 mmol/L — ABNORMAL HIGH (ref 20–29)
Calcium: 10.2 mg/dL (ref 8.7–10.3)
Chloride: 94 mmol/L — ABNORMAL LOW (ref 96–106)
Creatinine, Ser: 1.24 mg/dL — ABNORMAL HIGH (ref 0.57–1.00)
GFR calc Af Amer: 48 mL/min/{1.73_m2} — ABNORMAL LOW (ref >59)
GFR calc non Af Amer: 42 mL/min/{1.73_m2} — ABNORMAL LOW (ref >59)
Globulin, Total: 2.4 g/dL (ref 1.5–4.5)
Glucose: 201 mg/dL — ABNORMAL HIGH (ref 65–99)
Potassium: 4.9 mmol/L (ref 3.5–5.2)
Sodium: 142 mmol/L (ref 134–144)
Total Protein: 6.9 g/dL (ref 6.0–8.5)

## 2018-02-27 LAB — LIPID PANEL
Chol/HDL Ratio: 3.4 ratio (ref 0.0–4.4)
Cholesterol, Total: 158 mg/dL (ref 100–199)
HDL: 47 mg/dL (ref >39)
LDL Calculated: 68 mg/dL (ref 0–99)
Triglycerides: 216 mg/dL — ABNORMAL HIGH (ref 0–149)
VLDL Cholesterol Cal: 43 mg/dL — ABNORMAL HIGH (ref 5–40)

## 2018-02-27 LAB — VITAMIN D 25 HYDROXY (VIT D DEFICIENCY, FRACTURES): Vit D, 25-Hydroxy: 38.7 ng/mL (ref 30.0–100.0)

## 2018-03-02 ENCOUNTER — Other Ambulatory Visit: Payer: Self-pay | Admitting: Family

## 2018-03-02 ENCOUNTER — Other Ambulatory Visit: Payer: Self-pay | Admitting: Family Medicine

## 2018-03-02 MED ORDER — METFORMIN HCL ER 500 MG PO TB24: 500.0000 mg | ORAL_TABLET | Freq: Every day | ORAL | 1 refills | Status: DC

## 2018-03-02 MED ORDER — LEVOTHYROXINE SODIUM 112 MCG PO TABS: 112.0000 ug | ORAL_TABLET | Freq: Every day | ORAL | 11 refills | Status: DC

## 2018-03-03 ENCOUNTER — Ambulatory Visit (HOSPITAL_COMMUNITY)
Admission: RE | Admit: 2018-03-03 | Discharge: 2018-03-03 | Disposition: A | Discharge status: Home / Self Care | Payer: Medicare Other | Source: Ambulatory Visit | Attending: Family | Admitting: Family

## 2018-03-03 DIAGNOSIS — M7989 Other specified soft tissue disorders: Secondary | ICD-10-CM

## 2018-03-03 DIAGNOSIS — M79602 Pain in left arm: Secondary | ICD-10-CM | POA: Diagnosis not present

## 2018-03-03 DIAGNOSIS — R600 Localized edema: Secondary | ICD-10-CM | POA: Diagnosis not present

## 2018-03-05 ENCOUNTER — Encounter: Payer: Self-pay | Admitting: Family

## 2018-03-05 ENCOUNTER — Ambulatory Visit (INDEPENDENT_AMBULATORY_CARE_PROVIDER_SITE_OTHER): Payer: Medicare Other | Admitting: Family

## 2018-03-05 VITALS — BP 109/67 | HR 62 | Temp 96.8°F | Wt 282.4 lb

## 2018-03-05 DIAGNOSIS — I509 Heart failure, unspecified: Secondary | ICD-10-CM

## 2018-03-05 DIAGNOSIS — I5031 Acute diastolic (congestive) heart failure: Secondary | ICD-10-CM | POA: Diagnosis not present

## 2018-03-05 MED ORDER — FUROSEMIDE 40 MG PO TABS: 60.0000 mg | ORAL_TABLET | Freq: Every day | ORAL | 6 refills | Status: DC

## 2018-03-05 NOTE — Patient Instructions (Signed)
Heart Failure Heart failure is a condition in which the heart has trouble pumping blood because it has become weak or stiff. This means that the heart does not pump blood efficiently for the body to work well. For some people with heart failure, fluid may back up into the lungs and there may be swelling (edema) in the lower legs. Heart failure is usually a long-term (chronic) condition. It is important for you to take good care of yourself and follow the treatment plan from your health care provider. What are the causes? This condition is caused by some health problems, including:  High blood pressure (hypertension). Hypertension causes the heart muscle to work harder than normal. High blood pressure eventually causes the heart to become stiff and weak.  Coronary artery disease (CAD). CAD is the buildup of cholesterol and fat (plaques) in the arteries of the heart.  Heart attack (myocardial infarction). Injured tissue, which is caused by the heart attack, does not contract as well and the heart's ability to pump blood is weakened.  Abnormal heart valves. When the heart valves do not open and close properly, the heart muscle must pump harder to keep the blood flowing.  Heart muscle disease (cardiomyopathy or myocarditis). Heart muscle disease is damage to the heart muscle from a variety of causes, such as drug or alcohol abuse, infections, or unknown causes. These can increase the risk of heart failure.  Lung disease. When the lungs do not work properly, the heart must work harder.  What increases the risk? Risk of heart failure increases as a person ages. This condition is also more likely to develop in people who:  Are overweight.  Are female.  Smoke or chew tobacco.  Abuse alcohol or illegal drugs.  Have taken medicines that can damage the heart, such as chemotherapy drugs.  Have diabetes. ? High blood sugar (glucose) is associated with high fat (lipid) levels in the blood. ? Diabetes  can also damage tiny blood vessels that carry nutrients to the heart muscle.  Have abnormal heart rhythms.  Have thyroid problems.  Have low blood counts (anemia).  What are the signs or symptoms? Symptoms of this condition include:  Shortness of breath with activity, such as when climbing stairs.  Persistent cough.  Swelling of the feet, ankles, legs, or abdomen.  Unexplained weight gain.  Difficulty breathing when lying flat (orthopnea).  Waking from sleep because of the need to sit up and get more air.  Rapid heartbeat.  Fatigue and loss of energy.  Feeling light-headed, dizzy, or close to fainting.  Loss of appetite.  Nausea.  Increased urination during the night (nocturia).  Confusion.  How is this diagnosed? This condition is diagnosed based on:  Medical history, symptoms, and a physical exam.  Diagnostic tests, which may include: ? Echocardiogram. ? Electrocardiogram (ECG). ? Chest X-ray. ? Blood tests. ? Exercise stress test. ? Radionuclide scans. ? Cardiac catheterization and angiogram.  How is this treated? Treatment for this condition is aimed at managing the symptoms of heart failure. Medicines, behavioral changes, or other treatments may be necessary to treat heart failure. Medicines These may include:  Angiotensin-converting enzyme (ACE) inhibitors. This type of medicine blocks the effects of a blood protein called angiotensin-converting enzyme. ACE inhibitors relax (dilate) the blood vessels and help to lower blood pressure.  Angiotensin receptor blockers (ARBs). This type of medicine blocks the actions of a blood protein called angiotensin. ARBs dilate the blood vessels and help to lower blood pressure.  Water   pills (diuretics). Diuretics cause the kidneys to remove salt and water from the blood. The extra fluid is removed through urination, leaving a lower volume of blood that the heart has to pump.  Beta blockers. These improve heart  muscle strength and they prevent the heart from beating too quickly.  Digoxin. This increases the force of the heartbeat.  Healthy behavior changes These may include:  Reaching and maintaining a healthy weight.  Stopping smoking or chewing tobacco.  Eating heart-healthy foods.  Limiting or avoiding alcohol.  Stopping use of street drugs (illegal drugs).  Physical activity.  Other treatments These may include:  Surgery to open blocked coronary arteries or repair damaged heart valves.  Placement of a biventricular pacemaker to improve heart muscle function (cardiac resynchronization therapy). This device paces both the right ventricle and left ventricle.  Placement of a device to treat serious abnormal heart rhythms (implantable cardioverter defibrillator, or ICD).  Placement of a device to improve the pumping ability of the heart (left ventricular assist device, or LVAD).  Heart transplant. This can cure heart failure, and it is considered for certain patients who do not improve with other therapies.  Follow these instructions at home: Medicines  Take over-the-counter and prescription medicines only as told by your health care provider. Medicines are important in reducing the workload of your heart, slowing the progression of heart failure, and improving your symptoms. ? Do not stop taking your medicine unless your health care provider told you to do that. ? Do not skip any dose of medicine. ? Refill your prescriptions before you run out of medicine. You need your medicines every day. Eating and drinking   Eat heart-healthy foods. Talk with a dietitian to make an eating plan that is right for you. ? Choose foods that contain no trans fat and are low in saturated fat and cholesterol. Healthy choices include fresh or frozen fruits and vegetables, fish, lean meats, legumes, fat-free or low-fat dairy products, and whole-grain or high-fiber foods. ? Limit salt (sodium) if  directed by your health care provider. Sodium restriction may reduce symptoms of heart failure. Ask a dietitian to recommend heart-healthy seasonings. ? Use healthy cooking methods instead of frying. Healthy methods include roasting, grilling, broiling, baking, poaching, steaming, and stir-frying.  Limit your fluid intake if directed by your health care provider. Fluid restriction may reduce symptoms of heart failure. Lifestyle  Stop smoking or using chewing tobacco. Nicotine and tobacco can damage your heart and your blood vessels. Do not use nicotine gum or patches before talking to your health care provider.  Limit alcohol intake to no more than 1 drink per day for non-pregnant women and 2 drinks per day for men. One drink equals 12 oz of beer, 5 oz of wine, or 1 oz of hard liquor. ? Drinking more than that is harmful to your heart. Tell your health care provider if you drink alcohol several times a week. ? Talk with your health care provider about whether any level of alcohol use is safe for you. ? If your heart has already been damaged by alcohol or you have severe heart failure, drinking alcohol should be stopped completely.  Stop use of illegal drugs.  Lose weight if directed by your health care provider. Weight loss may reduce symptoms of heart failure.  Do moderate physical activity if directed by your health care provider. People who are elderly and people with severe heart failure should consult with a health care provider for physical activity recommendations.   Monitor important information  Weigh yourself every day. Keeping track of your weight daily helps you to notice excess fluid sooner. ? Weigh yourself every morning after you urinate and before you eat breakfast. ? Wear the same amount of clothing each time you weigh yourself. ? Record your daily weight. Provide your health care provider with your weight record.  Monitor and record your blood pressure as told by your health  care provider.  Check your pulse as told by your health care provider. Dealing with extreme temperatures  If the weather is extremely hot: ? Avoid vigorous physical activity. ? Use air conditioning or fans or seek a cooler location. ? Avoid caffeine and alcohol. ? Wear loose-fitting, lightweight, and light-colored clothing.  If the weather is extremely cold: ? Avoid vigorous physical activity. ? Layer your clothes. ? Wear mittens or gloves, a hat, and a scarf when you go outside. ? Avoid alcohol. General instructions  Manage other health conditions such as hypertension, diabetes, thyroid disease, or abnormal heart rhythms as told by your health care provider.  Learn to manage stress. If you need help to do this, ask your health care provider.  Plan rest periods when fatigued.  Get ongoing education and support as needed.  Participate in or seek rehabilitation as needed to maintain or improve independence and quality of life.  Stay up to date with immunizations. Keeping current on pneumococcal and influenza immunizations is especially important to prevent respiratory infections.  Keep all follow-up visits as told by your health care provider. This is important. Contact a health care provider if:  You have a rapid weight gain.  You have increasing shortness of breath that is unusual for you.  You are unable to participate in your usual physical activities.  You tire easily.  You cough more than normal, especially with physical activity.  You have any swelling or more swelling in areas such as your hands, feet, ankles, or abdomen.  You are unable to sleep because it is hard to breathe.  You feel like your heart is beating quickly (palpitations).  You become dizzy or light-headed when you stand up. Get help right away if:  You have difficulty breathing.  You notice or your family notices a change in your awareness, such as having trouble staying awake or having  difficulty with concentration.  You have pain or discomfort in your chest.  You have an episode of fainting (syncope). This information is not intended to replace advice given to you by your health care provider. Make sure you discuss any questions you have with your health care provider. Document Released: 04/07/2005 Document Revised: 12/11/2015 Document Reviewed: 10/31/2015 Elsevier Interactive Patient Education  2018 [employer]  

## 2018-03-05 NOTE — Progress Notes (Signed)
   Subjective:    Patient ID: Lorraine Leonard, female    DOB: August 18, 1940, 77 y.o.   MRN: [id number]  Chief Complaint  Patient presents with  . follow up swelling in feet and legs   Pt presents to the office today for CHF. She was seen last week and found to have moderate amount of swelling and increased SOB. We increased her lasix to 80 mg daily from 40 mg one day and then 60 mg the next. She reports her swelling has dramatically decreased and her SOB has improved.   She is 1 lb less today.  Congestive Heart Failure  Presents for follow-up visit. Associated symptoms include edema, fatigue, orthopnea and shortness of breath. Pertinent negatives include no chest pressure or claudication.      Review of Systems  Constitutional: Positive for fatigue.  Respiratory: Positive for shortness of breath.   Cardiovascular: Negative for claudication.  All other systems reviewed and are negative.      Objective:   Physical Exam  Constitutional: She is oriented to person, place, and time. She appears well-developed and well-nourished. No distress.  HENT:  Head: Normocephalic and atraumatic.  Right Ear: External ear normal.  Left Ear: External ear normal.  Mouth/Throat: Oropharynx is clear and moist.  Eyes: Pupils are equal, round, and reactive to light.  Neck: Normal range of motion. Neck supple. No thyromegaly present.  Cardiovascular: Normal rate, regular rhythm, normal heart sounds and intact distal pulses.  No murmur heard. Pulmonary/Chest: Effort normal. No respiratory distress. She has decreased breath sounds. She has no wheezes.  2L O2  Abdominal: Soft. Bowel sounds are normal. She exhibits no distension. There is no tenderness.  Musculoskeletal: She exhibits edema (trace BLE, greatly improved from last week). She exhibits no tenderness.  Generalized weakness, in wheelchair, unsteady gait   Neurological: She is alert and oriented to person, place, and time. She has normal reflexes. No  cranial nerve deficit.  Skin: Skin is warm and dry.  Psychiatric: She has a normal mood and affect. Her behavior is normal. Judgment and thought content normal.  Vitals reviewed.     BP 109/67   Pulse 62   Temp (!) 96.8 F (36 C) (Oral)   Wt 282 lb 6.4 oz (128.1 kg) Comment: unable to balance  BMI 44.23 kg/m      Assessment & Plan:  [name] comes in today with chief complaint of follow up swelling in feet and legs   Diagnosis and orders addressed:  1. Acute on chronic congestive heart failure, unspecified heart failure type (HCC) - BMP8+EGFR  2. Acute diastolic CHF (congestive heart failure) (HCC) We will decrease Lasix to 60 mg daily Weigh daily!!! Low salt diet RTO in 4 weeks and keep follow up with Cardiologists    - BMP8+EGFR - furosemide (LASIX) 40 MG tablet; Take 1.5 tablets (60 mg total) by mouth daily.  Dispense: 90 tablet; Refill: 6  3. Morbid obesity ([place]) - BMP8+EGFR     Evelina Dun, FNP

## 2018-03-06 LAB — BMP8+EGFR
BUN/Creatinine Ratio: 14 (ref 12–28)
BUN: 18 mg/dL (ref 8–27)
CO2: 29 mmol/L (ref 20–29)
Calcium: 10.1 mg/dL (ref 8.7–10.3)
Chloride: 97 mmol/L (ref 96–106)
Creatinine, Ser: 1.3 mg/dL — ABNORMAL HIGH (ref 0.57–1.00)
GFR calc Af Amer: 46 mL/min/{1.73_m2} — ABNORMAL LOW (ref >59)
GFR calc non Af Amer: 40 mL/min/{1.73_m2} — ABNORMAL LOW (ref >59)
Glucose: 186 mg/dL — ABNORMAL HIGH (ref 65–99)
Potassium: 4.2 mmol/L (ref 3.5–5.2)
Sodium: 146 mmol/L — ABNORMAL HIGH (ref 134–144)

## 2018-03-08 ENCOUNTER — Ambulatory Visit (INDEPENDENT_AMBULATORY_CARE_PROVIDER_SITE_OTHER): Payer: Medicare Other

## 2018-03-08 DIAGNOSIS — I4891 Unspecified atrial fibrillation: Secondary | ICD-10-CM | POA: Diagnosis not present

## 2018-03-08 NOTE — Progress Notes (Signed)
Remote pacemaker transmission.   

## 2018-03-09 ENCOUNTER — Telehealth: Payer: Self-pay | Admitting: Family

## 2018-03-09 ENCOUNTER — Other Ambulatory Visit: Payer: Self-pay | Admitting: Family

## 2018-03-09 MED ORDER — CEPHALEXIN 500 MG PO CAPS: 500.0000 mg | ORAL_CAPSULE | Freq: Two times a day (BID) | ORAL | 0 refills | Status: DC

## 2018-03-09 NOTE — Telephone Encounter (Signed)
What symptoms do you have? Burning and feels like she has to pee all the time  How long have you been sick? LAst night   Have you been seen for this problem? Yes in the past  If your provider decides to give you a prescription, which pharmacy would you like for it to be sent to? [facility] in [place]   Patient informed that this information will be sent to the clinical staff for review and that they should receive a follow up call.

## 2018-03-09 NOTE — Telephone Encounter (Signed)
Left message - rx sent as requested.

## 2018-03-09 NOTE — Telephone Encounter (Signed)
Keflex Prescription sent to pharmacy. If symptoms do not resolve or worsen she will need to be seen.

## 2018-03-09 NOTE — Telephone Encounter (Signed)
Patient states that she started having dysuria since lastnight and would like a abx to be sent in.  Advised patient that she NTBS and patient states Lorraine Leonard will send her in a abx. Please advise

## 2018-03-11 ENCOUNTER — Encounter: Payer: Self-pay | Admitting: Cardiology

## 2018-03-11 ENCOUNTER — Ambulatory Visit (INDEPENDENT_AMBULATORY_CARE_PROVIDER_SITE_OTHER): Payer: Medicare Other | Admitting: Internal Medicine

## 2018-03-11 ENCOUNTER — Encounter: Payer: Self-pay | Admitting: Internal Medicine

## 2018-03-11 VITALS — BP 102/58 | HR 93 | Ht 67.0 in | Wt 276.0 lb

## 2018-03-11 DIAGNOSIS — I4821 Permanent atrial fibrillation: Secondary | ICD-10-CM

## 2018-03-11 DIAGNOSIS — Z950 Presence of cardiac pacemaker: Secondary | ICD-10-CM

## 2018-03-11 LAB — CUP PACEART INCLINIC DEVICE CHECK
Battery Remaining Longevity: 136 mo
Battery Voltage: 3.04 V
Brady Statistic AP VP Percent: 9.87 %
Brady Statistic AP VS Percent: 0 %
Brady Statistic AS VP Percent: 0 %
Brady Statistic AS VS Percent: 90.13 %
Brady Statistic RA Percent Paced: 42.51 %
Brady Statistic RV Percent Paced: 9.87 %
Date Time Interrogation Session: 20191121183636
Implantable Lead Implant Date: 20190128
Implantable Lead Implant Date: 20190128
Implantable Lead Location: 753860
Implantable Lead Location: 753860
Implantable Lead Model: 3830
Implantable Lead Model: 5076
Implantable Pulse Generator Implant Date: 20190128
Lead Channel Impedance Value: 304 Ohm
Lead Channel Impedance Value: 380 Ohm
Lead Channel Impedance Value: 380 Ohm
Lead Channel Impedance Value: 532 Ohm
Lead Channel Pacing Threshold Amplitude: 0.75 V
Lead Channel Pacing Threshold Amplitude: 2.25 V
Lead Channel Pacing Threshold Pulse Width: 0.4 ms
Lead Channel Pacing Threshold Pulse Width: 1 ms
Lead Channel Sensing Intrinsic Amplitude: 12.625 mV
Lead Channel Sensing Intrinsic Amplitude: 2.875 mV
Lead Channel Setting Pacing Amplitude: 2.5 V
Lead Channel Setting Pacing Amplitude: 5 V
Lead Channel Setting Pacing Pulse Width: 0.4 ms
Lead Channel Setting Sensing Sensitivity: 0.9 mV

## 2018-03-11 MED ORDER — DIGOXIN 125 MCG PO TABS: 0.1250 mg | ORAL_TABLET | Freq: Every day | ORAL | 11 refills | Status: DC

## 2018-03-11 NOTE — Patient Instructions (Signed)
Medication Instructions:  Your physician has recommended you make the following change in your medication:   1.  Stop taking metoprolol  2.  Start taking DIGOXIN 0.125 mg tablets--One tablet by mouth daily  Labwork: You will go to the [place] office in 3 weeks (Around April 01, 2018) for a DIGOXIN LEVEL  Testing/Procedures: None ordered.  Follow-Up: Your physician wants you to follow-up in: 6 months with Dr. Lovena Le in [place].   You will receive a reminder letter in the mail two months in advance. If you don't receive a letter, please call our office to schedule the follow-up appointment.  Remote monitoring is used to monitor your Pacemaker from home. This monitoring reduces the number of office visits required to check your device to one time per year. It allows [place] to keep an eye on the functioning of your device to ensure it is working properly. You are scheduled for a device check from home on 06/07/2018. You may send your transmission at any time that day. If you have a wireless device, the transmission will be sent automatically. After your physician reviews your transmission, you will receive a postcard with your next transmission date.  Any Other Special Instructions Will Be Listed Below (If Applicable).  If you need a refill on your cardiac medications before your next appointment, please call your pharmacy.

## 2018-03-11 NOTE — Progress Notes (Signed)
HPI Mrs. Trevor returns today for followup of uncontrolled atrial fib and morbid obesity. She has lost 10 lbs in the interim. She is very sedentary due to her obesity. She underwent PPM and AV node ablation several months ago. She does not have palpitations. Her VR is better but still not ideal. Allergies  Allergen Reactions  . Allopurinol Swelling    Feet and legs swell  . Mobic [Meloxicam] Swelling and Other (See Comments)    Feet and Leg swelling     Current Outpatient Medications  Medication Sig Dispense Refill  . albuterol (PROVENTIL) (2.5 MG/3ML) 0.083% nebulizer solution Take 3 mLs (2.5 mg total) by nebulization every 6 (six) hours as needed for wheezing or shortness of breath. J96.01 150 mL 1  . azelastine (ASTELIN) 0.1 % nasal spray PLACE 2 SPRAYS INTO BOTH NOSTRILS 2 (TWO) TIMES DAILY AS NEEDED. 30 mL 3  . calcium citrate (CALCITRATE - DOSED IN MG ELEMENTAL CALCIUM) 950 MG tablet Take 1 tablet by mouth 2 (two) times daily.     . cephALEXin (KEFLEX) 500 MG capsule Take 1 capsule (500 mg total) by mouth 2 (two) times daily. 14 capsule 0  . Cholecalciferol 2000 units CAPS Take 2,000 Units by mouth daily.     Marland Kitchen conjugated estrogens (PREMARIN) vaginal cream Place 0.5 g vaginally as needed. Weekly prn.  Use as directed    . cyanocobalamin 1000 MCG tablet Take 1,000 mcg by mouth daily.     Marland Kitchen DEXILANT 60 MG capsule Take 60 mg by mouth daily.  5  . diazepam (VALIUM) 5 MG tablet TAKE 1 TABLET BY MOUTH TWICE A DAY AS NEEDED FOR ANXIETY 60 tablet 2  . diltiazem (CARDIZEM CD) 180 MG 24 hr capsule TAKE 1 CAPSULE BY MOUTH TWICE A DAY 180 capsule 0  . docusate sodium (COLACE) 100 MG capsule Take 200 mg by mouth daily as needed for mild constipation.    Marland Kitchen ELIQUIS 5 MG TABS tablet TAKE 1 TABLET (5 MG TOTAL) BY MOUTH 2 (TWO) TIMES DAILY. RESUME 05/23/17 180 tablet 0  . EPINEPHrine 0.3 mg/0.3 mL IJ SOAJ injection Inject 0.3 mg into the muscle once as needed (For anaphylaxis.).    Marland Kitchen  famotidine (PEPCID) 20 MG tablet Take 1 tablet (20 mg total) by mouth 2 (two) times daily. 30 tablet 0  . FeFum-FePoly-FA-B Cmp-C-Biot (FOLIVANE-PLUS) CAPS Take 1 capsule by mouth 2 (two) times daily.  1  . fluticasone (FLONASE) 50 MCG/ACT nasal spray PLACE 1 SPRAY INTO BOTH NOSTRILS 2 (TWO) TIMES DAILY AS NEEDED FOR ALLERGIES OR RHINITIS. 16 g 2  . furosemide (LASIX) 40 MG tablet Take 1.5 tablets (60 mg total) by mouth daily. 90 tablet 6  . hydrocortisone 2.5 % cream APPLIED TO AFFECTED AREAS 2 TIMES PER DAY FOR 1 WEEK. DO NOT APPLY TO FACE. 28.35 g 0  . INCRUSE ELLIPTA 62.5 MCG/INH AEPB INHALE 1 PUFF BY MOUTH EVERY DAY 90 each 0  . levocetirizine (XYZAL) 5 MG tablet TAKE 1 TABLET BY MOUTH EVERY DAY IN THE EVENING 90 tablet 0  . levothyroxine (SYNTHROID) 112 MCG tablet Take 1 tablet (112 mcg total) by mouth daily. 30 tablet 11  . metFORMIN (GLUCOPHAGE XR) 500 MG 24 hr tablet Take 1 tablet (500 mg total) by mouth daily with breakfast. 90 tablet 1  . ondansetron (ZOFRAN) 8 MG tablet Take 8 mg by mouth every 8 (eight) hours as needed for nausea or vomiting.    Marland Kitchen OVER THE COUNTER MEDICATION  Place 1 drop into both eyes as needed (For dry eyes.). Systane Gel Drops    . PATADAY 0.2 % SOLN Place 1 drop into both eyes daily.    . polyethylene glycol powder (GLYCOLAX/MIRALAX) powder TAKE 17 G BY MOUTH 2 (TWO) TIMES DAILY AS NEEDED. 3162 g 0  . predniSONE (DELTASONE) 10 MG tablet Take 10 mg by mouth daily with breakfast.    . rosuvastatin (CRESTOR) 10 MG tablet TAKE 1 TABLET BY MOUTH EVERYDAY AT BEDTIME 90 tablet 0  . spironolactone (ALDACTONE) 25 MG tablet TAKE 1 TABLET BY MOUTH EVERY DAY 90 tablet 0  . traMADol (ULTRAM) 50 MG tablet Take 1 tablet (50 mg total) by mouth every 6 (six) hours as needed. 90 tablet 2  . VIGAMOX 0.5 % ophthalmic solution Apply 1 drop to eye as needed (after eye injection).   12  . vitamin C (ASCORBIC ACID) 500 MG tablet Take 1,000 mg by mouth daily.     . Vitamin D,  Ergocalciferol, (DRISDOL) 50000 units CAPS capsule TAKE 1 CAPSULE (50,000 UNITS TOTAL) BY MOUTH EVERY 7 (SEVEN) DAYS. 12 capsule 0  . vitamin E 400 UNIT capsule Take 400 Units by mouth daily.    . metoprolol tartrate (LOPRESSOR) 25 MG tablet TAKE 0.5 TABLETS (12.5 MG TOTAL) BY MOUTH 2 (TWO) TIMES DAILY. 180 tablet 3   No current facility-administered medications for this visit.      Past Medical History:  Diagnosis Date  . Acute diastolic CHF (congestive heart failure) ([place]) 11/11/2016  . Allergic rhinitis   . Anxiety   . Anxiety   . Arthritis    "~ all my joints" (05/18/2017)  . Atrial fibrillation ([place]) 09/2016  . Atrial fibrillation with RVR ([place]) 05/18/2017  . Chronic lower back pain   . COPD (chronic obstructive pulmonary disease) ([place])   . Depression   . Diverticulosis   . Dyspnea   . Dysrhythmia   . GERD (gastroesophageal reflux disease)   . Gout   . 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 Hyperlipidemia   . Hypertension   . Hypothyroidism   . Macular degeneration of both eyes   . Neuropathy   . On home oxygen therapy    "3L; 24/7" (05/18/2017)  . Pre-diabetes   . 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 Pulmonary fibrosis ([place])   . Pulmonary fibrosis ([place])   . Rheumatoid arthritis ([place])   . Sleep apnea    uses 3 liters of o2 24/7  . Urticaria     ROS:   All systems reviewed and negative except as noted in the HPI.   Past Surgical History:  Procedure Laterality Date  . AV NODE ABLATION  05/18/2017  . AV NODE ABLATION N/A 05/18/2017   Procedure: AV NODE ABLATION;  Surgeon: Evans Lance, MD;  Location: [facility];  Service: Cardiovascular;  Laterality: N/A;  . BACK SURGERY    . BREAST LUMPECTOMY Left   . CARDIOVERSION N/A 01/30/2017   Procedure: CARDIOVERSION;  Surgeon: Josue Hector, MD;  [facility];  Service: Cardiovascular;  Laterality: N/A;  . CARDIOVERSION N/A 04/09/2017    Procedure: CARDIOVERSION;  Surgeon: Satira Sark, MD;  [facility];  Service: Cardiovascular;  Laterality: N/A;  . CATARACT EXTRACTION W/ INTRAOCULAR LENS  IMPLANT, BILATERAL Bilateral   . DILATION AND CURETTAGE OF UTERUS    . EUS N/A 10/03/2015   Procedure: UPPER  ENDOSCOPIC ULTRASOUND (EUS) RADIAL;  Surgeon: Arta Silence, MD;  Location: [facility];  Service: Endoscopy;  Laterality: N/A;  . FINE NEEDLE ASPIRATION N/A 10/03/2015   Procedure: FINE NEEDLE ASPIRATION (FNA) RADIAL;  Surgeon: Arta Silence, MD;  Location: [facility];  Service: Endoscopy;  Laterality: N/A;  . INSERT / REPLACE / REMOVE PACEMAKER  05/18/2017  . LUMBAR LAMINECTOMY  1995; 06/23/2008   L4-5/notes 08/20/2010  . [place]   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  . PACEMAKER IMPLANT N/A 05/18/2017   Procedure: PACEMAKER IMPLANT;  Surgeon: Evans Lance, MD;  Location: [facility];  Service: Cardiovascular;  Laterality: N/A;  . TOTAL ABDOMINAL HYSTERECTOMY    . TUBAL LIGATION       Family History  Problem Relation Age of Onset  . Diabetes Unknown   . Hypertension Unknown   . Coronary artery disease Unknown   . Stroke Unknown   . Asthma Unknown   . Kidney disease Sister   . Cancer Sister        lung  . Lung cancer Sister   . Cancer Sister        lung  . Lung cancer Sister   . Cancer Sister        lung  . Suicidality Son        committed suicide in 2009  . CVA Son   . Hyperlipidemia Unknown   . Anxiety disorder Unknown   . Migraines Unknown   . Heart failure Father   . Healthy Brother   . Cancer Sister        lung  . Healthy Sister      Social History   Socioeconomic History  . Marital status: Married    Spouse name: Not on file  . Number of children: 4  . Years of education: Not on file  . Highest education level: Not on file  Occupational History  . Occupation: retired    Comment: cna  Social Needs  . Financial resource strain: Not hard at all  .  Food insecurity:    Worry: Never true    Inability: Never true  . Transportation needs:    Medical: No    Non-medical: No  Tobacco Use  . Smoking status: Never Smoker  . Smokeless tobacco: Never Used  Substance and Sexual Activity  . Alcohol use: No  . Drug use: No  . Sexual activity: Not Currently    Birth control/protection: Surgical  Lifestyle  . Physical activity:    Days per week: 0 days    Minutes per session: 0 min  . Stress: Rather much  Relationships  . Social connections:    Talks on phone: More than three times a week    Gets together: More than three times a week    Attends religious service: Never    Active member of club or organization: No    Attends meetings of clubs or organizations: Never    Relationship status: Married  . Intimate partner violence:    Fear of current or ex partner: No    Emotionally abused: No    Physically abused: No    Forced sexual activity: No  Other Topics Concern  . 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   Pt lives with her husband in a single wide trailer. They have 4 children and 13 grandchildren.  BP (!) 102/58   Pulse 93   Ht 5\' 7"  (1.702 m)   Wt 276 lb (125.2 kg)   SpO2 99%   BMI 43.23 kg/m   Physical Exam:  Well appearing NAD HEENT: Unremarkable Neck:  No JVD, no thyromegally Lymphatics:  No adenopathy Back:  No CVA tenderness Lungs:  Clear with no wheezes HEART:  Regular rate rhythm, no murmurs, no rubs, no clicks Abd:  soft, positive bowel sounds, no organomegally, no rebound, no guarding Ext:  2 plus pulses, no edema, no cyanosis, no clubbing Skin:  No rashes no nodules Neuro:  CN II through XII intact, motor grossly intact  EKG - atrial fib with a controlled VR and occaisional ventricular pacing  DEVICE  Normal device function.  See PaceArt for details.   Assess/Plan: 1. Uncontrolled atrial fib - her rates are better after  AV node modification and his bundle pacing. She was having some safety pacing and we adjusted her AV delay. I have asked that she stop metoprolol and start digoxin. We will check a digoxin level in several weeks. 2. Obesity - I encouraged weight loss. 3. PPM - her medtronic device is working normally. We will recheck in several months.  Lorraine Leonard.D.

## 2018-03-20 ENCOUNTER — Telehealth: Payer: Self-pay | Admitting: Family

## 2018-03-20 NOTE — Telephone Encounter (Signed)
She needs to be seen or can do an e visit

## 2018-03-20 NOTE — Telephone Encounter (Signed)
Tried to call patient  No Vm

## 2018-03-20 NOTE — Telephone Encounter (Signed)
Please address

## 2018-03-23 ENCOUNTER — Ambulatory Visit (HOSPITAL_COMMUNITY)
Admission: RE | Admit: 2018-03-23 | Discharge: 2018-03-23 | Disposition: A | Discharge status: Home / Self Care | Payer: Medicare Other | Source: Ambulatory Visit | Attending: Emergency Medicine | Admitting: Emergency Medicine

## 2018-03-23 ENCOUNTER — Encounter: Payer: Self-pay | Admitting: Family Medicine

## 2018-03-23 ENCOUNTER — Ambulatory Visit (INDEPENDENT_AMBULATORY_CARE_PROVIDER_SITE_OTHER): Payer: Medicare Other | Admitting: Family Medicine

## 2018-03-23 VITALS — BP 110/70 | HR 76 | Temp 97.7°F | Ht 67.0 in | Wt 276.0 lb

## 2018-03-23 DIAGNOSIS — J849 Interstitial pulmonary disease, unspecified: Secondary | ICD-10-CM | POA: Diagnosis not present

## 2018-03-23 DIAGNOSIS — R300 Dysuria: Secondary | ICD-10-CM

## 2018-03-23 DIAGNOSIS — N3000 Acute cystitis without hematuria: Secondary | ICD-10-CM

## 2018-03-23 LAB — URINALYSIS, COMPLETE
Bilirubin, UA: NEGATIVE
Glucose, UA: NEGATIVE
Nitrite, UA: NEGATIVE
RBC, UA: NEGATIVE
Specific Gravity, UA: 1.015 (ref 1.005–1.030)
Urobilinogen, Ur: 0.2 mg/dL (ref 0.2–1.0)
pH, UA: 5.5 (ref 5.0–7.5)

## 2018-03-23 LAB — MICROSCOPIC EXAMINATION
Renal Epithel, UA: NONE SEEN /hpf
WBC, UA: >30 /hpf — AB (ref 0–5)

## 2018-03-23 MED ORDER — AMOXICILLIN-POT CLAVULANATE 875-125 MG PO TABS: 1.0000 | ORAL_TABLET | Freq: Two times a day (BID) | ORAL | 0 refills | Status: DC

## 2018-03-23 NOTE — Patient Instructions (Signed)

## 2018-03-23 NOTE — Progress Notes (Signed)
Lorraine Leonard, [name], FNP Chief Complaint  Patient presents with  . Urinary Tract Infection    Current Issues:  Presents with 5 days of dysuria, urinary urgency, urinary frequency and lower abdominal pressure. Associated symptoms include:  cloudy urine and nausea. She denies fever, chills, fatigue, flank pain, or confusion.   There is a previous history of of similar symptoms. Sexually active:  No   No concern for STI.  Prior to Admission medications   Medication Sig Start Date End Date Taking? Authorizing Provider  albuterol (PROVENTIL) (2.5 MG/3ML) 0.083% nebulizer solution Take 3 mLs (2.5 mg total) by nebulization every 6 (six) hours as needed for wheezing or shortness of breath. J96.01 02/20/17  Yes Hawks, Christy A, FNP  azelastine (ASTELIN) 0.1 % nasal spray PLACE 2 SPRAYS INTO BOTH NOSTRILS 2 (TWO) TIMES DAILY AS NEEDED. 10/01/17  Yes Hawks, Christy A, FNP  calcium citrate (CALCITRATE - DOSED IN MG ELEMENTAL CALCIUM) 950 MG tablet Take 1 tablet by mouth 2 (two) times daily.    Yes [provider]  cephALEXin (KEFLEX) 500 MG capsule Take 1 capsule (500 mg total) by mouth 2 (two) times daily. 03/09/18  Yes Hawks, Alyse Low A, FNP  Cholecalciferol 2000 units CAPS Take 2,000 Units by mouth daily.    Yes [provider]  conjugated estrogens (PREMARIN) vaginal cream Place 0.5 g vaginally as needed. Weekly prn.  Use as directed   Yes [provider]  cyanocobalamin 1000 MCG tablet Take 1,000 mcg by mouth daily.    Yes [provider]  DEXILANT 60 MG capsule Take 60 mg by mouth daily. 12/18/16  Yes [provider]  diazepam (VALIUM) 5 MG tablet TAKE 1 TABLET BY MOUTH TWICE A DAY AS NEEDED FOR ANXIETY 09/15/17  Yes Hawks, Christy A, FNP  digoxin (LANOXIN) 0.125 MG tablet Take 1 tablet (0.125 mg total) by mouth daily. 03/11/18  Yes Evans Lance, MD  diltiazem (CARDIZEM CD) 180 MG 24 hr capsule TAKE 1 CAPSULE BY MOUTH TWICE A DAY 01/11/18  Yes Hawks,  Christy A, FNP  docusate sodium (COLACE) 100 MG capsule Take 200 mg by mouth daily as needed for mild constipation.   Yes [provider]  ELIQUIS 5 MG TABS tablet TAKE 1 TABLET (5 MG TOTAL) BY MOUTH 2 (TWO) TIMES DAILY. RESUME 05/23/17 03/10/18  Yes Hawks, Christy A, FNP  EPINEPHrine 0.3 mg/0.3 mL IJ SOAJ injection Inject 0.3 mg into the muscle once as needed (For anaphylaxis.).   Yes [provider]  famotidine (PEPCID) 20 MG tablet Take 1 tablet (20 mg total) by mouth 2 (two) times daily. 09/28/17  Yes Francine Graven, DO  FeFum-FePoly-FA-B Cmp-C-Biot [facility]) CAPS Take 1 capsule by mouth 2 (two) times daily. 02/03/17  Yes [provider]  fluticasone (FLONASE) 50 MCG/ACT nasal spray PLACE 1 SPRAY INTO BOTH NOSTRILS 2 (TWO) TIMES DAILY AS NEEDED FOR ALLERGIES OR RHINITIS. 02/19/18  Yes Hawks, Christy A, FNP  furosemide (LASIX) 40 MG tablet Take 1.5 tablets (60 mg total) by mouth daily. 03/05/18  Yes Hawks, Christy A, FNP  hydrocortisone 2.5 % cream APPLIED TO AFFECTED AREAS 2 TIMES PER DAY FOR 1 WEEK. DO NOT APPLY TO FACE. 09/30/17  Yes Hawks, Christy A, FNP  INCRUSE ELLIPTA 62.5 MCG/INH AEPB INHALE 1 PUFF BY MOUTH EVERY DAY 03/02/18  Yes Hawks, Christy A, FNP  levocetirizine (XYZAL) 5 MG tablet TAKE 1 TABLET BY MOUTH EVERY DAY IN THE EVENING 02/22/18  Yes Hawks, [name], FNP  levothyroxine (SYNTHROID)  112 MCG tablet Take 1 tablet (112 mcg total) by mouth daily. 03/02/18 03/02/19 Yes Hawks, Alyse Low A, FNP  metFORMIN (GLUCOPHAGE XR) 500 MG 24 hr tablet Take 1 tablet (500 mg total) by mouth daily with breakfast. 03/02/18 03/02/19 Yes Hawks, Christy A, FNP  ondansetron (ZOFRAN) 8 MG tablet Take 8 mg by mouth every 8 (eight) hours as needed for nausea or vomiting.   Yes [provider]  OVER THE COUNTER MEDICATION Place 1 drop into both eyes as needed (For dry eyes.). Systane Gel Drops   Yes [provider]  PATADAY 0.2 % SOLN Place 1 drop into both  eyes daily. 06/10/12  Yes [provider]  polyethylene glycol powder (GLYCOLAX/MIRALAX) powder TAKE 17 G BY MOUTH 2 (TWO) TIMES DAILY AS NEEDED. 01/19/17  Yes Hawks, Christy A, FNP  predniSONE (DELTASONE) 10 MG tablet Take 10 mg by mouth daily with breakfast.   Yes [provider]  rosuvastatin (CRESTOR) 10 MG tablet TAKE 1 TABLET BY MOUTH EVERYDAY AT BEDTIME 02/22/18  Yes Hawks, Christy A, FNP  spironolactone (ALDACTONE) 25 MG tablet TAKE 1 TABLET BY MOUTH EVERY DAY 02/22/18  Yes Hawks, Christy A, FNP  traMADol (ULTRAM) 50 MG tablet Take 1 tablet (50 mg total) by mouth every 6 (six) hours as needed. 09/03/17  Yes Hawks, Christy A, FNP  VIGAMOX 0.5 % ophthalmic solution Apply 1 drop to eye as needed (after eye injection).  08/30/15  Yes [provider]  vitamin C (ASCORBIC ACID) 500 MG tablet Take 1,000 mg by mouth daily.    Yes [provider]  Vitamin D, Ergocalciferol, (DRISDOL) 50000 units CAPS capsule TAKE 1 CAPSULE (50,000 UNITS TOTAL) BY MOUTH EVERY 7 (SEVEN) DAYS. 01/18/18  Yes Hawks, Christy A, FNP  vitamin E 400 UNIT capsule Take 400 Units by mouth daily.   Yes [provider]    Review of Systems  Constitutional: Negative for chills, fever and malaise/fatigue.  Cardiovascular: Negative for chest pain.  Gastrointestinal: Positive for abdominal pain (lower abdominal pressure) and nausea. Negative for constipation and diarrhea.  Genitourinary: Positive for dysuria, frequency and urgency. Negative for flank pain and hematuria.  Neurological: Negative for headaches.  All other systems reviewed and are negative.    PE:  BP 110/70   Pulse 76   Temp 97.7 F (36.5 C) (Oral)   Ht 5\' 7"  (1.702 m)   Wt 276 lb (125.2 kg)   BMI 43.23 kg/m  Physical Exam  Constitutional: She is oriented to person, place, and time. She appears well-developed and well-nourished. She is cooperative. She appears distressed.  HENT:  Head: Normocephalic.  Cardiovascular:  Normal rate, regular rhythm and normal heart sounds.  Pulmonary/Chest: Effort normal. No respiratory distress. She has decreased breath sounds.  Abdominal: Soft. Bowel sounds are normal. There is no tenderness. There is no CVA tenderness.  Neurological: She is alert and oriented to person, place, and time.  Skin: Skin is warm and dry.  Psychiatric: She has a normal mood and affect. Her behavior is normal. Judgment and thought content normal.  Nursing note and vitals reviewed.   Results for orders placed or performed in visit on 03/11/18  CUP PACEART [facility] DEVICE CHECK  Result Value Ref Range   Date Time Interrogation Session [id number]    Pulse Generator Manufacturer MERM    Pulse Gen Model [id number] Azure XT DR MRI    Pulse Gen Serial Number [id number]    Clinic Name [facility]  Type Implantable Pulse Generator    Implantable Pulse Generator Implant Date [id number]    Implantable Lead Manufacturer MERM    Implantable Lead Model 3830 SelectSecure    Implantable Lead Serial Number [id number]    Implantable Lead Implant Date [id number]    Implantable Lead Location Detail 1 UNKNOWN    Implantable Lead Special Function BUNDLE OF HIS    Implantable Lead Location [id number]    Implantable Lead Manufacturer MERM    Implantable Lead Model 5076 CapSureFix Novus MRI SureScan    Implantable Lead Serial Number [id number]    Implantable Lead Implant Date [id number]    Implantable Lead Location Detail 1 APEX    Implantable Lead Location [id number]    Lead Channel Setting Sensing Sensitivity 0.9 mV   Lead Channel Setting Pacing Amplitude 5 V   Lead Channel Setting Pacing Pulse Width 0.4 ms   Lead Channel Setting Pacing Amplitude 2.5 V   Lead Channel Impedance Value 380 ohm   Lead Channel Impedance Value 304 ohm   Lead Channel Sensing Intrinsic Amplitude 2.875 mV   Lead Channel Pacing Threshold Amplitude 2.25 V   Lead Channel Pacing Threshold Pulse Width 1.0 ms   Lead  Channel Impedance Value 532 ohm   Lead Channel Impedance Value 380 ohm   Lead Channel Sensing Intrinsic Amplitude 12.625 mV   Lead Channel Pacing Threshold Amplitude 0.75 V   Lead Channel Pacing Threshold Pulse Width 0.4 ms   Battery Status OK    Battery Remaining Longevity 136 mo   Battery Voltage 3.04 V   Brady Statistic RA Percent Paced 42.51 %   Brady Statistic RV Percent Paced 9.87 %   Brady Statistic AP VP Percent 9.87 %   Brady Statistic AS VP Percent 0 %   Brady Statistic AP VS Percent 0 %   Brady Statistic AS VS Percent 90.13 %   Eval Rhythm irreg. Vs 70s-120s    Urinalysis in office: positive for 2+ leukocytes, 1+ protein, and trace ketones.  Pertinent labs & imaging results that were available during my care of the patient were reviewed by me and considered in my medical decision making.  Assessment and Plan:    1. Dysuria - Urinalysis, Complete - Urine Culture  2. Acute cystitis without hematuria Urinalysis positive for 2+ leukocytes, 1+ protein, and trace ketones. Previous urine culture revealed Klebsiella pneumoniae which was susceptible to Augmentin. Pt states she has taken Keflex and Cipro in the past without complete relief of symptoms. Will treat with Augmentin. Culture pending, will change antibiotic therapy if warranted.  Increase water intake. Proper hygiene and wiping discussed. Take showers instead of baths. Change incontinence briefs frequently.  - Urinalysis, Complete - Urine Culture - Augmentin 822-125 mg twice daily for 10 days  Return in 4 weeks (on 04/20/2018).  The above assessment and management plan was discussed with the patient. The patient verbalized understanding of and has agreed to the management plan. Patient is aware to call the clinic if symptoms fail to improve or worsen. Patient is aware when to return to the clinic for a follow-up visit. Patient educated on when it is appropriate to go to the emergency department.   Monia Pouch,  FNP-C [facility] [address] [place], [place] [address] [phone]

## 2018-03-25 ENCOUNTER — Telehealth: Payer: Self-pay | Admitting: Family

## 2018-03-25 ENCOUNTER — Other Ambulatory Visit: Payer: Self-pay | Admitting: Family Medicine

## 2018-03-25 DIAGNOSIS — B961 Klebsiella pneumoniae [K. pneumoniae] as the cause of diseases classified elsewhere: Secondary | ICD-10-CM

## 2018-03-25 DIAGNOSIS — N3090 Cystitis, unspecified without hematuria: Principal | ICD-10-CM

## 2018-03-25 DIAGNOSIS — B9689 Other specified bacterial agents as the cause of diseases classified elsewhere: Secondary | ICD-10-CM

## 2018-03-25 LAB — URINE CULTURE

## 2018-03-25 MED ORDER — SULFAMETHOXAZOLE-TRIMETHOPRIM 800-160 MG PO TABS: 1.0000 | ORAL_TABLET | Freq: Two times a day (BID) | ORAL | 0 refills | Status: AC

## 2018-03-25 NOTE — Telephone Encounter (Signed)
Pt is rtn call about lab results

## 2018-03-25 NOTE — Progress Notes (Signed)
Urine culture revealed Klebsiella aerogenes which is not covered by Augmentin. Will change antibiotic to bactrim. Recheck in 3-4 weeks.

## 2018-03-25 NOTE — Telephone Encounter (Signed)
Patient aware to discontinue Augmentin and start Bactrim.

## 2018-03-29 ENCOUNTER — Other Ambulatory Visit: Payer: Self-pay | Admitting: Family

## 2018-03-29 NOTE — Telephone Encounter (Signed)
Please see other call, this has been taken care of, will close encounter.

## 2018-03-30 NOTE — Telephone Encounter (Signed)
Last Vit D 02/26/18  38.7

## 2018-04-01 ENCOUNTER — Other Ambulatory Visit (HOSPITAL_COMMUNITY)
Admission: RE | Admit: 2018-04-01 | Discharge: 2018-04-01 | Disposition: A | Discharge status: Home / Self Care | Payer: Medicare Other | Source: Ambulatory Visit | Attending: Internal Medicine | Admitting: Internal Medicine

## 2018-04-01 ENCOUNTER — Other Ambulatory Visit: Payer: Medicare Other

## 2018-04-01 DIAGNOSIS — I4821 Permanent atrial fibrillation: Secondary | ICD-10-CM | POA: Insufficient documentation

## 2018-04-01 LAB — DIGOXIN LEVEL: Digoxin Level: 0.9 ng/mL (ref 0.8–2.0)

## 2018-04-03 ENCOUNTER — Other Ambulatory Visit: Payer: Self-pay | Admitting: Internal Medicine

## 2018-04-03 ENCOUNTER — Telehealth: Payer: Self-pay | Admitting: Family

## 2018-04-03 ENCOUNTER — Other Ambulatory Visit: Payer: Self-pay | Admitting: Family

## 2018-04-03 MED ORDER — ESTROGENS, CONJUGATED 0.625 MG/GM VA CREA: 0.5000 g | TOPICAL_CREAM | VAGINAL | 1 refills | Status: DC | PRN

## 2018-04-03 MED ORDER — CIPROFLOXACIN HCL 500 MG PO TABS: 500.0000 mg | ORAL_TABLET | Freq: Two times a day (BID) | ORAL | 0 refills | Status: DC

## 2018-04-03 NOTE — Telephone Encounter (Signed)
Pt states UTI symptoms are no better Continued dysuria and frequency Pt states she can not come in for OV Wants antibiotic Please review and advise

## 2018-04-03 NOTE — Telephone Encounter (Signed)
Cipro Prescription sent to pharmacy. If symptoms do not improve or worsen over the next 48 hours she will need to be seen.

## 2018-04-03 NOTE — Addendum Note (Signed)
Addended by: Marin Olp on: 04/03/2018 11:07 AM   Modules accepted: Orders

## 2018-04-05 NOTE — Telephone Encounter (Signed)
Pharmacy comment:  Alternative Requested MED NOT COVERED BY INSURANCE.  INSURANCE WILL COVER ESTRACE VAG CREAM 0.01%

## 2018-04-06 ENCOUNTER — Other Ambulatory Visit: Payer: Self-pay | Admitting: Family

## 2018-04-07 ENCOUNTER — Ambulatory Visit (INDEPENDENT_AMBULATORY_CARE_PROVIDER_SITE_OTHER): Payer: Medicare Other | Admitting: Emergency Medicine

## 2018-04-07 ENCOUNTER — Encounter: Payer: Self-pay | Admitting: Emergency Medicine

## 2018-04-07 DIAGNOSIS — J9611 Chronic respiratory failure with hypoxia: Secondary | ICD-10-CM | POA: Diagnosis not present

## 2018-04-07 DIAGNOSIS — J449 Chronic obstructive pulmonary disease, unspecified: Secondary | ICD-10-CM | POA: Diagnosis not present

## 2018-04-07 DIAGNOSIS — J8410 Pulmonary fibrosis, unspecified: Secondary | ICD-10-CM | POA: Diagnosis not present

## 2018-04-07 NOTE — Assessment & Plan Note (Signed)
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  No significant change on repeat CT scan 2019 compared with 03/2017.  I do not know that there is any indication to continue to follow serial scans unless she clinically changes.  I do not think she is a good candidate for anti-fibrotic therapy

## 2018-04-07 NOTE — Assessment & Plan Note (Signed)
Possible obstruction on her spirometry with a clinical improvement with Incruse.  We will continue this.  She has albuterol to use as needed.

## 2018-04-07 NOTE — Telephone Encounter (Signed)
Last seen 03/23/18  

## 2018-04-07 NOTE — Progress Notes (Signed)
  Subjective:    Patient ID: Lorraine Leonard, female    DOB: 1940-07-21, 77 y.o.   MRN: [id number]  [place] 11/11/17 --77 year old woman with interstitial lung disease and hypoxemic respiratory failure, possible superimposed obstruction on pulmonary function testing.  She is been managed with Incruse for this and believes that she has benefited.  She is on prednisone 10 mg daily for presumed/possible rheumatoid arthritis although this diagnosis is not ever been entirely clear.  She is using oxygen at 3 L/min pulsed.  Sleep apnea has been suspected but she has not wanted to have a sleep study to confirm it. She gets SOB with minimal exertion, with even short walks. She uses nebulized albuterol most days, not all.    ROV 04/07/18 --77 year old woman with a questionable history of rheumatoid arthritis (question how this was officially diagnosed) who follows up today for interstitial lung disease with associated hypoxemic respiratory failure.  She also has possible superimposed obstruction based on her pulmonary function testing this been managed with Incruse.  We repeated her CT scan of the chest on 03/24/2018 and I have reviewed.  This shows no interval progression compared with 1 year ago 03/23/2017.  This been very slight progression compared with scans going all the way back to 2008. Her overall functional capacity is down. She has more dyspnea w exertion. She has foot and leg pain. Remains on pred 10mg  daily. Her metoprolol was stopped a month ago, started digoxin.  She believes that she benefits from Incruse, uses nebs prn.    No evidence obstruction on spirometry.  PULMONARY FUNCTON TEST 02/03/2012  FVC 2.91  FEV1 2.1  FEV1/FVC 72.2  FVC  % Predicted 89  FEV % Predicted 90  FeF 25-75 1.45  FeF 25-75 % Predicted 2.5        Objective:   Physical Exam Vitals:   04/07/18 1504  BP: 110/78  Pulse: 78  SpO2: 94%  Weight: 271 lb 12.8 oz (123.3 kg)  Height: 5\' 7"  (1.702 m)   Gen: Pleasant, obese  woman, in no distress,  normal affect, wheelchair  ENT: No lesions,  mouth clear,  oropharynx clear, no postnasal drip  Neck: No JVD, no stridor  Lungs: No use of accessory muscles, few insp crackles L base  Cardiovascular: RRR, heart sounds normal, no murmur or gallops, no peripheral edema  Musculoskeletal: No deformities, no cyanosis or clubbing  Neuro: alert, non focal  Skin: Warm, no lesions or rashes       Assessment & Plan:  Pulmonary fibrosis (HCC) Interstitial lung disease that we have presumed is related to autoimmune disease.  No significant change on repeat CT scan 2019 compared with 03/2017.  I do not know that there is any indication to continue to follow serial scans unless she clinically changes.  I do not think she is a good candidate for anti-fibrotic therapy  Chronic respiratory failure with hypoxia (HCC) Continue oxygen at 3 L/min at all times  COPD (chronic obstructive pulmonary disease) ([place]) Possible obstruction on her spirometry with a clinical improvement with Incruse.  We will continue this.  She has albuterol to use as needed.  Baltazar Apo, MD, PhD 04/07/2018, 3:30 PM [facility] [phone] or if no answer [phone]

## 2018-04-07 NOTE — Patient Instructions (Signed)
Your CT scan of the chest shows that there has been no interval change in your lung inflammation and scar.  This is good news.  We should not need to repeat your scan going forward unless you have new symptoms or change in your breathing. Please continue Incruse as you have been taking it. Keep your albuterol nebulizer available use up to every 4 hours if needed for shortness of breath, chest tightness, wheezing. Continue your oxygen at 3 L/min at all times. Continue your cardiac medications as directed by Dr. Lovena Le. Follow with Dr. Lamonte Sakai in 12 months or sooner if you have any problems.

## 2018-04-07 NOTE — Assessment & Plan Note (Signed)
Continue oxygen at 3 L/min at all times 

## 2018-04-08 ENCOUNTER — Ambulatory Visit (INDEPENDENT_AMBULATORY_CARE_PROVIDER_SITE_OTHER): Payer: Medicare Other | Admitting: Family

## 2018-04-08 ENCOUNTER — Encounter: Payer: Self-pay | Admitting: Family

## 2018-04-08 VITALS — BP 132/67 | HR 63 | Temp 96.3°F | Ht 67.0 in | Wt 273.0 lb

## 2018-04-08 DIAGNOSIS — N3000 Acute cystitis without hematuria: Secondary | ICD-10-CM

## 2018-04-08 DIAGNOSIS — Z87440 Personal history of urinary (tract) infections: Secondary | ICD-10-CM | POA: Diagnosis not present

## 2018-04-08 DIAGNOSIS — N182 Chronic kidney disease, stage 2 (mild): Secondary | ICD-10-CM

## 2018-04-08 DIAGNOSIS — R300 Dysuria: Secondary | ICD-10-CM

## 2018-04-08 LAB — URINALYSIS, COMPLETE
Bilirubin, UA: NEGATIVE
Glucose, UA: NEGATIVE
Ketones, UA: NEGATIVE
Nitrite, UA: NEGATIVE
Protein, UA: NEGATIVE
Specific Gravity, UA: 1.01 (ref 1.005–1.030)
Urobilinogen, Ur: 0.2 mg/dL (ref 0.2–1.0)
pH, UA: 6 (ref 5.0–7.5)

## 2018-04-08 LAB — MICROSCOPIC EXAMINATION
Renal Epithel, UA: NONE SEEN /hpf
WBC, UA: >30 /hpf — AB (ref 0–5)

## 2018-04-08 MED ORDER — SULFAMETHOXAZOLE-TRIMETHOPRIM 800-160 MG PO TABS: 1.0000 | ORAL_TABLET | Freq: Two times a day (BID) | ORAL | 0 refills | Status: DC

## 2018-04-08 MED ORDER — CEFTRIAXONE SODIUM 1 G IJ SOLR
1.0000 g | Freq: Once | INTRAMUSCULAR | Status: AC
  Administered 2018-04-08: 1 g via INTRAMUSCULAR

## 2018-04-08 NOTE — Patient Instructions (Signed)

## 2018-04-08 NOTE — Progress Notes (Signed)
Subjective:    Patient ID: Lorraine Leonard, female    DOB: 12-14-1940, 77 y.o.   MRN: [id number]  Chief Complaint  Patient presents with  . follow up CHF and bladder infection    Urinary Tract Infection   This is a recurrent problem. The current episode started 1 to 4 weeks ago. The problem occurs intermittently. The problem has been waxing and waning. The quality of the pain is described as burning. The pain is at a severity of 3/10. The pain is mild. Associated symptoms include hesitancy and urgency. Pertinent negatives include no flank pain, hematuria, nausea or vomiting. She has tried antibiotics and increased fluids for the symptoms. The treatment provided mild relief.  Congestive Heart Failure  Presents for follow-up visit. Associated symptoms include edema (slightly), fatigue and shortness of breath. Pertinent negatives include no claudication or unexpected weight change. The symptoms have been stable.      Review of Systems  Constitutional: Positive for fatigue. Negative for unexpected weight change.  Respiratory: Positive for shortness of breath.   Cardiovascular: Negative for claudication.  Gastrointestinal: Negative for nausea and vomiting.  Genitourinary: Positive for hesitancy and urgency. Negative for flank pain and hematuria.       Objective:   Physical Exam Vitals signs reviewed.  Constitutional:      General: She is not in acute distress.    Appearance: She is well-developed.  Neck:     Musculoskeletal: Normal range of motion and neck supple.     Thyroid: No thyromegaly.  Cardiovascular:     Rate and Rhythm: Normal rate and regular rhythm.     Heart sounds: Normal heart sounds. No murmur.  Pulmonary:     Effort: Pulmonary effort is normal. No respiratory distress.     Breath sounds: Normal breath sounds. No wheezing.     Comments: 3L O2 continuous Abdominal:     General: Bowel sounds are normal. There is no distension.     Palpations: Abdomen is soft.   Tenderness: There is no abdominal tenderness.  Musculoskeletal:        General: No tenderness.     Right lower leg: Edema (trace) present.     Left lower leg: Edema (trace) present.     Comments: Generalized weakness, in wheelchair   Skin:    General: Skin is warm and dry.  Neurological:     Mental Status: She is alert.     Cranial Nerves: No cranial nerve deficit.     Deep Tendon Reflexes: Reflexes are normal and symmetric.  Psychiatric:        Behavior: Behavior normal.        Thought Content: Thought content normal.        Judgment: Judgment normal.       BP 132/67   Pulse 63   Temp (!) 96.3 F (35.7 C) (Oral)   Ht 5\' 7"  (1.702 m)   Wt 273 lb (123.8 kg)   BMI 42.76 kg/m      Assessment & Plan:  Lorraine Leonard comes in today with chief complaint of follow up CHF and bladder infection   Diagnosis and orders addressed:  1. History of UTI - Urinalysis, Complete - Urine Culture  2. Dysuria - Urine Culture  3. CKD (chronic kidney disease) stage 2, GFR 60-89 ml/min - Urine Culture  4. Acute cystitis without hematuria Force fluids AZO over the counter X2 days RTO prn Culture pending - sulfamethoxazole-trimethoprim (BACTRIM DS) 800-160 MG tablet; Take 1 tablet  by mouth 2 (two) times daily.  Dispense: 14 tablet; Refill: 0 - cefTRIAXone (ROCEPHIN) injection 1 g    Follow up plan: Pt will drop off urine after completing antibiotic. She will schedule visit for face to face for lift chair   Evelina Dun, FNP

## 2018-04-09 ENCOUNTER — Encounter (INDEPENDENT_AMBULATORY_CARE_PROVIDER_SITE_OTHER): Payer: Medicare Other | Admitting: Ophthalmology

## 2018-04-09 DIAGNOSIS — H43813 Vitreous degeneration, bilateral: Secondary | ICD-10-CM | POA: Diagnosis not present

## 2018-04-09 DIAGNOSIS — H353231 Exudative age-related macular degeneration, bilateral, with active choroidal neovascularization: Secondary | ICD-10-CM | POA: Diagnosis not present

## 2018-04-09 DIAGNOSIS — H35033 Hypertensive retinopathy, bilateral: Secondary | ICD-10-CM | POA: Diagnosis not present

## 2018-04-09 DIAGNOSIS — I10 Essential (primary) hypertension: Secondary | ICD-10-CM | POA: Diagnosis not present

## 2018-04-09 DIAGNOSIS — H26491 Other secondary cataract, right eye: Secondary | ICD-10-CM

## 2018-04-12 ENCOUNTER — Other Ambulatory Visit: Payer: Self-pay | Admitting: Family

## 2018-04-12 ENCOUNTER — Telehealth: Payer: Self-pay | Admitting: Family

## 2018-04-12 LAB — URINE CULTURE

## 2018-04-12 MED ORDER — AMOXICILLIN-POT CLAVULANATE 875-125 MG PO TABS: 1.0000 | ORAL_TABLET | Freq: Two times a day (BID) | ORAL | 0 refills | Status: DC

## 2018-04-12 NOTE — Telephone Encounter (Signed)
Pt is wanting to talk to nurse about her urine and antibiotic, wants to know why she is having to change medicine if she is suppose to bring in another urine later this week

## 2018-04-12 NOTE — Telephone Encounter (Signed)
Reviewed results and recommendations and pt voiced understanding.

## 2018-04-15 ENCOUNTER — Other Ambulatory Visit: Payer: Self-pay | Admitting: *Deleted

## 2018-04-15 MED ORDER — LEVOTHYROXINE SODIUM 112 MCG PO TABS: 112.0000 ug | ORAL_TABLET | Freq: Every day | ORAL | 3 refills | Status: DC

## 2018-04-17 ENCOUNTER — Other Ambulatory Visit: Payer: Self-pay | Admitting: Family

## 2018-04-17 DIAGNOSIS — F419 Anxiety disorder, unspecified: Secondary | ICD-10-CM

## 2018-04-19 ENCOUNTER — Other Ambulatory Visit: Payer: Medicare Other

## 2018-04-19 DIAGNOSIS — Z87440 Personal history of urinary (tract) infections: Secondary | ICD-10-CM | POA: Diagnosis not present

## 2018-04-19 LAB — MICROSCOPIC EXAMINATION
Epithelial Cells (non renal): >10 /hpf — AB (ref 0–10)
Renal Epithel, UA: NONE SEEN /hpf

## 2018-04-19 LAB — URINALYSIS, COMPLETE
Bilirubin, UA: NEGATIVE
Glucose, UA: NEGATIVE
Leukocytes, UA: NEGATIVE
Nitrite, UA: NEGATIVE
RBC, UA: NEGATIVE
Specific Gravity, UA: 1.025 (ref 1.005–1.030)
Urobilinogen, Ur: 0.2 mg/dL (ref 0.2–1.0)
pH, UA: 6 (ref 5.0–7.5)

## 2018-04-19 NOTE — Progress Notes (Signed)
Patient dropped off UA for [facility].

## 2018-04-19 NOTE — Telephone Encounter (Signed)
Last seen 12/19  [facility]

## 2018-04-22 ENCOUNTER — Other Ambulatory Visit: Payer: Medicare Other

## 2018-04-22 ENCOUNTER — Other Ambulatory Visit: Payer: Self-pay | Admitting: *Deleted

## 2018-04-22 DIAGNOSIS — R8290 Unspecified abnormal findings in urine: Secondary | ICD-10-CM

## 2018-04-23 LAB — URINE CULTURE

## 2018-04-27 ENCOUNTER — Encounter: Payer: Self-pay | Admitting: Cardiology

## 2018-04-27 ENCOUNTER — Ambulatory Visit (INDEPENDENT_AMBULATORY_CARE_PROVIDER_SITE_OTHER): Payer: Medicare Other | Admitting: Cardiology

## 2018-04-27 VITALS — BP 136/61 | HR 70 | Ht 68.0 in | Wt 279.0 lb

## 2018-04-27 DIAGNOSIS — I4891 Unspecified atrial fibrillation: Secondary | ICD-10-CM | POA: Diagnosis not present

## 2018-04-27 DIAGNOSIS — Z79899 Other long term (current) drug therapy: Secondary | ICD-10-CM | POA: Diagnosis not present

## 2018-04-27 DIAGNOSIS — I5033 Acute on chronic diastolic (congestive) heart failure: Secondary | ICD-10-CM | POA: Diagnosis not present

## 2018-04-27 NOTE — Progress Notes (Signed)
Clinical Summary Lorraine Leonard is a 78 y.o.female seen today for follow up of the following medical problems.  1. Afib - followed by EP. Failed rate control due to bradycardia. Unable to maintain rhythm control - she is s/p AV node ablation with pacemaker placement by EP.   - isolated episode of palpitations - compliant with meds - no bleeding on eliquis.   - 02/2018 visit with Dr Lovena Le metoprolol stopped, started on digoxin.  -no recent bleeding. No significant palpitations.    2.Chronic diastolic HF - 10/6224 echo LVEF 55-60%, no WMAs - not checking home weights. By clinic weights up 8 lbs since last month. Has had some increased LE edema, some increased DOE   3. Pulmonary fibrosis - followed by pulmionary - normally 3 L 24 hrs per day.  Past Medical History:  Diagnosis Date  . Acute diastolic CHF (congestive heart failure) ([place]) 11/11/2016  . Allergic rhinitis   . Anxiety   . Anxiety   . Arthritis    "~ all my joints" (05/18/2017)  . Atrial fibrillation ([place]) 09/2016  . Atrial fibrillation with RVR ([place]) 05/18/2017  . Chronic lower back pain   . COPD (chronic obstructive pulmonary disease) ([place])   . Depression   . Diverticulosis   . Dyspnea   . Dysrhythmia   . GERD (gastroesophageal reflux disease)   . Gout   . 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 Hyperlipidemia   . Hypertension   . Hypothyroidism   . Macular degeneration of both eyes   . Neuropathy   . On home oxygen therapy    "3L; 24/7" (05/18/2017)  . Pre-diabetes   . 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 Pulmonary fibrosis ([place])   . Pulmonary fibrosis ([place])   . Rheumatoid arthritis ([place])   . Sleep apnea    uses 3 liters of o2 24/7  . Urticaria      Allergies  Allergen Reactions  . Allopurinol Swelling    Feet and legs swell  . Mobic [Meloxicam] Swelling and Other (See Comments)    Feet and Leg swelling     Current Outpatient  Medications  Medication Sig Dispense Refill  . albuterol (PROVENTIL) (2.5 MG/3ML) 0.083% nebulizer solution Take 3 mLs (2.5 mg total) by nebulization every 6 (six) hours as needed for wheezing or shortness of breath. J96.01 150 mL 1  . amoxicillin-clavulanate (AUGMENTIN) 875-125 MG tablet Take 1 tablet by mouth 2 (two) times daily. 14 tablet 0  . azelastine (ASTELIN) 0.1 % nasal spray PLACE 2 SPRAYS INTO BOTH NOSTRILS 2 (TWO) TIMES DAILY AS NEEDED. 30 mL 3  . calcium citrate (CALCITRATE - DOSED IN MG ELEMENTAL CALCIUM) 950 MG tablet Take 1 tablet by mouth 2 (two) times daily.     . Cholecalciferol 2000 units CAPS Take 2,000 Units by mouth daily.     . cyanocobalamin 1000 MCG tablet Take 1,000 mcg by mouth daily.     Marland Kitchen DEXILANT 60 MG capsule Take 60 mg by mouth daily.  5  . diazepam (VALIUM) 5 MG tablet TAKE 1 TABLET BY MOUTH TWICE A DAY AS NEEDED FOR ANXIETY 60 tablet 0  . digoxin (LANOXIN) 0.125 MG tablet TAKE 1 TABLET BY MOUTH DAILY 90 tablet 3  . diltiazem (CARDIZEM CD) 180 MG 24 hr capsule TAKE 1 CAPSULE BY MOUTH TWICE A DAY 180 capsule 0  . docusate sodium (COLACE) 100 MG capsule Take 200  mg by mouth daily as needed for mild constipation.    Marland Kitchen ELIQUIS 5 MG TABS tablet TAKE 1 TABLET (5 MG TOTAL) BY MOUTH 2 (TWO) TIMES DAILY. RESUME 05/23/17 180 tablet 0  . EPINEPHrine 0.3 mg/0.3 mL IJ SOAJ injection Inject 0.3 mg into the muscle once as needed (For anaphylaxis.).    Marland Kitchen ESTRACE VAGINAL 0.1 MG/GM vaginal cream Please specify directions, refills and quantity 1 g 0  . famotidine (PEPCID) 20 MG tablet Take 1 tablet (20 mg total) by mouth 2 (two) times daily. 30 tablet 0  . FeFum-FePoly-FA-B Cmp-C-Biot (FOLIVANE-PLUS) CAPS Take 1 capsule by mouth 2 (two) times daily.  1  . fluticasone (FLONASE) 50 MCG/ACT nasal spray PLACE 1 SPRAY INTO BOTH NOSTRILS 2 (TWO) TIMES DAILY AS NEEDED FOR ALLERGIES OR RHINITIS. 16 g 2  . furosemide (LASIX) 40 MG tablet Take 1.5 tablets (60 mg total) by mouth daily. 90 tablet  6  . hydrocortisone 2.5 % cream APPLIED TO AFFECTED AREAS 2 TIMES PER DAY FOR 1 WEEK. DO NOT APPLY TO FACE. 28.35 g 0  . INCRUSE ELLIPTA 62.5 MCG/INH AEPB INHALE 1 PUFF BY MOUTH EVERY DAY 90 each 0  . levocetirizine (XYZAL) 5 MG tablet TAKE 1 TABLET BY MOUTH EVERY DAY IN THE EVENING 90 tablet 0  . levothyroxine (SYNTHROID) 112 MCG tablet Take 1 tablet (112 mcg total) by mouth daily. 90 tablet 3  . metFORMIN (GLUCOPHAGE XR) 500 MG 24 hr tablet Take 1 tablet (500 mg total) by mouth daily with breakfast. 90 tablet 1  . ondansetron (ZOFRAN) 8 MG tablet TAKE 1 TABLET EVERY 8 HOURS AS NEEDED FOR NAUSEA 20 tablet 2  . OVER THE COUNTER MEDICATION Place 1 drop into both eyes as needed (For dry eyes.). Systane Gel Drops    . PATADAY 0.2 % SOLN Place 1 drop into both eyes daily.    . polyethylene glycol powder (GLYCOLAX/MIRALAX) powder TAKE 17 G BY MOUTH 2 (TWO) TIMES DAILY AS NEEDED. 3162 g 0  . predniSONE (DELTASONE) 10 MG tablet Take 10 mg by mouth daily with breakfast.    . rosuvastatin (CRESTOR) 10 MG tablet TAKE 1 TABLET BY MOUTH EVERYDAY AT BEDTIME 90 tablet 0  . spironolactone (ALDACTONE) 25 MG tablet TAKE 1 TABLET BY MOUTH EVERY DAY 90 tablet 0  . sulfamethoxazole-trimethoprim (BACTRIM DS) 800-160 MG tablet Take 1 tablet by mouth 2 (two) times daily. 14 tablet 0  . traMADol (ULTRAM) 50 MG tablet Take 1 tablet (50 mg total) by mouth every 6 (six) hours as needed. 90 tablet 2  . VIGAMOX 0.5 % ophthalmic solution Apply 1 drop to eye as needed (after eye injection).   12  . vitamin C (ASCORBIC ACID) 500 MG tablet Take 1,000 mg by mouth daily.     . Vitamin D, Ergocalciferol, (DRISDOL) 1.25 MG (50000 UT) CAPS capsule TAKE 1 CAPSULE (50,000 UNITS TOTAL) BY MOUTH EVERY 7 (SEVEN) DAYS. 12 capsule 0  . vitamin E 400 UNIT capsule Take 400 Units by mouth daily.     No current facility-administered medications for this visit.      Past Surgical History:  Procedure Laterality Date  . AV NODE ABLATION   05/18/2017  . AV NODE ABLATION N/A 05/18/2017   Procedure: AV NODE ABLATION;  Surgeon: Evans Lance, MD;  Location: [facility];  Service: Cardiovascular;  Laterality: N/A;  . BACK SURGERY    . BREAST LUMPECTOMY Left   . CARDIOVERSION N/A 01/30/2017   Procedure: CARDIOVERSION;  Surgeon: Josue Hector, MD;  [facility];  Service: Cardiovascular;  Laterality: N/A;  . CARDIOVERSION N/A 04/09/2017   Procedure: CARDIOVERSION;  Surgeon: Satira Sark, MD;  Location[facility];  Service: Cardiovascular;  Laterality: N/A;  . CATARACT EXTRACTION W/ INTRAOCULAR LENS  IMPLANT, BILATERAL Bilateral   . DILATION AND CURETTAGE OF UTERUS    . EUS N/A 10/03/2015   Procedure: UPPER ENDOSCOPIC ULTRASOUND (EUS) RADIAL;  Surgeon: Arta Silence, MD;  Location: [facility];  Service: Endoscopy;  Laterality: N/A;  . FINE NEEDLE ASPIRATION N/A 10/03/2015   Procedure: FINE NEEDLE ASPIRATION (FNA) RADIAL;  Surgeon: Arta Silence, MD;  Location: [facility];  Service: Endoscopy;  Laterality: N/A;  . INSERT / REPLACE / REMOVE PACEMAKER  05/18/2017  . LUMBAR LAMINECTOMY  1995; 06/23/2008   L4-5/notes 08/20/2010  . 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 PACEMAKER IMPLANT N/A 05/18/2017   Procedure: PACEMAKER IMPLANT;  Surgeon: Evans Lance, MD;  Location: [place] [facility];  Service: Cardiovascular;  Laterality: N/A;  . TOTAL ABDOMINAL HYSTERECTOMY    . TUBAL LIGATION       Allergies  Allergen Reactions  . Allopurinol Swelling    Feet and legs swell  . Mobic [Meloxicam] Swelling and Other (See Comments)    Feet and Leg swelling      Family History  Problem Relation Age of Onset  . Diabetes Other   . Hypertension Other   . Coronary artery disease Other   . Stroke Other   . Asthma Other   . Kidney disease Sister   . Cancer Sister        lung  . Lung cancer Sister   . Cancer Sister        lung  . Lung cancer Sister   . Cancer Sister         lung  . Suicidality Son        committed suicide in 2009  . CVA Son   . Hyperlipidemia Other   . Anxiety disorder Other   . Migraines Other   . Heart failure Father   . Healthy Brother   . Cancer Sister        lung  . Healthy Sister      Social History Ms. Engelson reports that she has never smoked. She has never used smokeless tobacco. Ms. Morrisette reports no history of alcohol use.   Review of Systems CONSTITUTIONAL: No weight loss, fever, chills, weakness or fatigue.  HEENT: Eyes: No visual loss, blurred vision, double vision or yellow sclerae.No hearing loss, sneezing, congestion, runny nose or sore throat.  SKIN: No rash or itching.  CARDIOVASCULAR: per hpi RESPIRATORY: per hpi GASTROINTESTINAL: No anorexia, nausea, vomiting or diarrhea. No abdominal pain or blood.  GENITOURINARY: No burning on urination, no polyuria NEUROLOGICAL: No headache, dizziness, syncope, paralysis, ataxia, numbness or tingling in the extremities. No change in bowel or bladder control.  MUSCULOSKELETAL: No muscle, back pain, joint pain or stiffness.  LYMPHATICS: No enlarged nodes. No history of splenectomy.  PSYCHIATRIC: No history of depression or anxiety.  ENDOCRINOLOGIC: No reports of sweating, cold or heat intolerance. No polyuria or polydipsia.  Marland Kitchen   Physical Examination Vitals:   04/27/18 1319  BP: 136/61  Pulse: 70  SpO2: 96%   Vitals:   04/27/18 1319  Weight: 279 lb (126.6 kg)  Height: 5\' 8"  (1.727 m)    Gen: resting comfortably, no acute distress HEENT: no  scleral icterus, pupils equal round and reactive, no palptable cervical adenopathy,  CV: irreg Resp: Clear to auscultation bilaterally GI: abdomen is soft, non-tender, non-distended, normal bowel sounds, no hepatosplenomegaly MSK: extremities are warm, 2+ bilateral LE edema Skin: warm, no rash Neuro:  no focal deficits Psych: appropriate affect   Diagnostic Studies 11/2015 echo Study Conclusions  - Left ventricle: The  cavity size was normal. There was severe focal basal hypertrophy of the septum. Systolic function was vigorous. The estimated ejection fraction was in the range of 65% to 70%. Wall motion was normal; there were no regional wall motion abnormalities. - Mitral valve: Moderately calcified annulus. There was mild regurgitation. - Left atrium: Volume/bsa, ES (1-plane Simpson&'s, A4C): 26.5 ml/m^2. - Right ventricle: The cavity size was normal. Wall thickness was mildly increased. - Pulmonary arteries: Systolic pressure was within the normal range.   09/2016 echo Study Conclusions  - Left ventricle: The cavity size was normal. Wall thickness was increased in a pattern of mild LVH. Systolic function was normal. The estimated ejection fraction was in the range of 60% to 65%. Wall motion was normal; there were no regional wall motion abnormalities. Features are consistent with a pseudonormal left ventricular filling pattern, with concomitant abnormal relaxation and increased filling pressure (grade 2 diastolic dysfunction). - Aortic valve: Mildly calcified annulus. Trileaflet. - Mitral valve: Calcified annulus. - Right atrium: Central venous pressure (est): 3 mm Hg. - Atrial septum: No defect or patent foramen ovale was identified. - Tricuspid valve: There was trivial regurgitation. - Pulmonary arteries: PA peak pressure: 25 mm Hg (S). - Pericardium, extracardiac: A prominent pericardial fat pad was present.  Impressions:  - Mild LVH with LVEF 60-65% and grade 2 diastolic dysfunction. Calcified mitral annulus. Mildly sclerotic aortic valve. Trivial tricuspid regurgitation with normal PASP estimated 25 mmHg. Prominent pericardial fat pad noted.      Assessment and Plan  1. Afib - no recent symptoms, continue to follow with EP - continue anticoagulation  2.Acute on chronic diastolic HF - significant weight gain and edema - she is  not sure how she is taking her lasix at home. Once clarified we will increase dose and repeat BMET/Mg in [redacted] weeks along with nursing visit for vitals and weight check in 2 weeks  After appt she clarified only taking lasix 40mg  daily, will increase to 60 mg daily.    F/u 4 months      Arnoldo Lenis, M.D.

## 2018-04-27 NOTE — Patient Instructions (Signed)
Medication Instructions:  Your physician recommends that you continue on your current medications as directed. Please refer to the Current Medication list given to you today.  If you need a refill on your cardiac medications before your next appointment, please call your pharmacy.   Lab work: Your physician recommends that you return for lab work in: 2 Weeks (  If you have labs (blood work) drawn today and your tests are completely normal, you will receive your results only by: Marland Kitchen MyChart Message (if you have MyChart) OR . A paper copy in the mail If you have any lab test that is abnormal or we need to change your treatment, we will call you to review the results.  Testing/Procedures: NONE   Follow-Up: At [facility], you and your health needs are our priority.  As part of our continuing mission to provide you with exceptional heart care, we have created designated Provider Care Teams.  These Care Teams include your primary Cardiologist (physician) and Advanced Practice Providers (APPs -  Physician Assistants and Nurse Practitioners) who all work together to provide you with the care you need, when you need it. You will need a follow up appointment in 4 months.  Please call our office 2 months in advance to schedule this appointment.  You may see Carlyle Dolly, MD or one of the following Advanced Practice Providers on your designated Care Team:   Bernerd Pho, PA-C [facility]) . Ermalinda Barrios, PA-C ([place])  Your physician recommends that you schedule a follow-up appointment in: 2 weeks for nurse visit.  Please call office with current Lasix dose and frequency.   Any Other Special Instructions Will Be Listed Below (If Applicable). Thank you for choosing [facility]!

## 2018-04-28 ENCOUNTER — Telehealth: Payer: Self-pay | Admitting: Cardiology

## 2018-04-28 NOTE — Telephone Encounter (Signed)
After trying to call Chastidy (pt's grand daughter) several times I called pt and informed her to take her lasix 60 mg daily. She voiced understanding.

## 2018-04-28 NOTE — Telephone Encounter (Signed)
Please call pt's grand daughter Carolyne Fiscal concerning her meds [phone]

## 2018-04-30 ENCOUNTER — Ambulatory Visit (INDEPENDENT_AMBULATORY_CARE_PROVIDER_SITE_OTHER): Payer: Medicare Other | Admitting: Family

## 2018-04-30 ENCOUNTER — Encounter: Payer: Self-pay | Admitting: Family

## 2018-04-30 VITALS — BP 126/77 | HR 66 | Temp 97.6°F | Ht 68.0 in | Wt 279.0 lb

## 2018-04-30 DIAGNOSIS — J8410 Pulmonary fibrosis, unspecified: Secondary | ICD-10-CM

## 2018-04-30 DIAGNOSIS — R300 Dysuria: Secondary | ICD-10-CM | POA: Diagnosis not present

## 2018-04-30 DIAGNOSIS — I272 Pulmonary hypertension, unspecified: Secondary | ICD-10-CM | POA: Diagnosis not present

## 2018-04-30 DIAGNOSIS — M160 Bilateral primary osteoarthritis of hip: Secondary | ICD-10-CM

## 2018-04-30 DIAGNOSIS — I5031 Acute diastolic (congestive) heart failure: Secondary | ICD-10-CM

## 2018-04-30 DIAGNOSIS — N390 Urinary tract infection, site not specified: Secondary | ICD-10-CM

## 2018-04-30 DIAGNOSIS — J449 Chronic obstructive pulmonary disease, unspecified: Secondary | ICD-10-CM

## 2018-04-30 LAB — URINALYSIS, COMPLETE
Bilirubin, UA: NEGATIVE
Glucose, UA: NEGATIVE
Ketones, UA: NEGATIVE
Nitrite, UA: NEGATIVE
Protein, UA: NEGATIVE
RBC, UA: NEGATIVE
Specific Gravity, UA: 1.01 (ref 1.005–1.030)
Urobilinogen, Ur: 0.2 mg/dL (ref 0.2–1.0)
pH, UA: 6.5 (ref 5.0–7.5)

## 2018-04-30 LAB — MICROSCOPIC EXAMINATION: Renal Epithel, UA: NONE SEEN /hpf

## 2018-04-30 MED ORDER — CEPHALEXIN 500 MG PO CAPS: 500.0000 mg | ORAL_CAPSULE | Freq: Two times a day (BID) | ORAL | 0 refills | Status: DC

## 2018-04-30 NOTE — Patient Instructions (Signed)
Urinary Tract Infection, Adult A urinary tract infection (UTI) is an infection of any part of the urinary tract. The urinary tract includes:  The kidneys.  The ureters.  The bladder.  The urethra. These organs make, store, and get rid of pee (urine) in the body. What are the causes? This is caused by germs (bacteria) in your genital area. These germs grow and cause swelling (inflammation) of your urinary tract. What increases the risk? You are more likely to develop this condition if:  You have a small, thin tube (catheter) to drain pee.  You cannot control when you pee or poop (incontinence).  You are female, and: ? You use these methods to prevent pregnancy: ? A medicine that kills sperm (spermicide). ? A device that blocks sperm (diaphragm). ? You have low levels of a female hormone (estrogen). ? You are pregnant.  You have genes that add to your risk.  You are sexually active.  You take antibiotic medicines.  You have trouble peeing because of: ? A prostate that is bigger than normal, if you are female. ? A blockage in the part of your body that drains pee from the bladder (urethra). ? A kidney stone. ? A nerve condition that affects your bladder (neurogenic bladder). ? Not getting enough to drink. ? Not peeing often enough.  You have other conditions, such as: ? Diabetes. ? A weak disease-fighting system (immune system). ? Sickle cell disease. ? Gout. ? Injury of the spine. What are the signs or symptoms? Symptoms of this condition include:  Needing to pee right away (urgently).  Peeing often.  Peeing small amounts often.  Pain or burning when peeing.  Blood in the pee.  Pee that smells bad or not like normal.  Trouble peeing.  Pee that is cloudy.  Fluid coming from the vagina, if you are female.  Pain in the belly or lower back. Other symptoms include:  Throwing up (vomiting).  No urge to eat.  Feeling mixed up (confused).  Being tired  and grouchy (irritable).  A fever.  Watery poop (diarrhea). How is this treated? This condition may be treated with:  Antibiotic medicine.  Other medicines.  Drinking enough water. Follow these instructions at home:  Medicines  Take over-the-counter and prescription medicines only as told by your doctor.  If you were prescribed an antibiotic medicine, take it as told by your doctor. Do not stop taking it even if you start to feel better. General instructions  Make sure you: ? Pee until your bladder is empty. ? Do not hold pee for a long time. ? Empty your bladder after sex. ? Wipe from front to back after pooping if you are a female. Use each tissue one time when you wipe.  Drink enough fluid to keep your pee pale yellow.  Keep all follow-up visits as told by your doctor. This is important. Contact a doctor if:  You do not get better after 1-2 days.  Your symptoms go away and then come back. Get help right away if:  You have very bad back pain.  You have very bad pain in your lower belly.  You have a fever.  You are sick to your stomach (nauseous).  You are throwing up. Summary  A urinary tract infection (UTI) is an infection of any part of the urinary tract.  This condition is caused by germs in your genital area.  There are many risk factors for a UTI. These include having a small, thin   tube to drain pee and not being able to control when you pee or poop.  Treatment includes antibiotic medicines for germs.  Drink enough fluid to keep your pee pale yellow. This information is not intended to replace advice given to you by your health care provider. Make sure you discuss any questions you have with your health care provider. Document Released: 09/24/2007 Document Revised: 10/15/2017 Document Reviewed: 10/15/2017 Elsevier Interactive Patient Education  2019 Elsevier Inc.  

## 2018-04-30 NOTE — Progress Notes (Addendum)
Subjective:    Patient ID: Lorraine Leonard, female    DOB: Sep 01, 1940, 78 y.o.   MRN: [id number]  Chief Complaint  Patient presents with  . Face to Face for a lift chair  . referral back to Urology for history of urinary infections   Pt presents to the office for face to face visit for a life chair. She has pulmonary hypertension, COPD, Pulmonary Fibrosis, osteoarthritis, CHF, and morbid obesity. She states she can not get out her chair without help. She is on continuous  3 L of oxygen.   She reports intermittent aching, cramping in her lower legs of 8-9 out 10.   Dysuria   This is a recurrent problem. The current episode started more than 1 month ago. The problem occurs every urination. The problem has been waxing and waning. The quality of the pain is described as burning. The pain is at a severity of 10/10. The pain is mild. Associated symptoms include frequency and urgency. Pertinent negatives include no hematuria, hesitancy, nausea or vomiting. Treatments tried: AZO. The treatment provided mild relief.      Review of Systems  Constitutional: Positive for fatigue.  Respiratory: Positive for shortness of breath.   Gastrointestinal: Negative for nausea and vomiting.  Genitourinary: Positive for dysuria, frequency and urgency. Negative for hematuria and hesitancy.  Musculoskeletal: Positive for arthralgias.  Neurological: Positive for weakness.       Objective:   Physical Exam Vitals signs reviewed.  Constitutional:      General: She is not in acute distress.    Appearance: She is well-developed. She is obese.  HENT:     Head: Normocephalic and atraumatic.  Eyes:     Pupils: Pupils are equal, round, and reactive to light.  Neck:     Musculoskeletal: Normal range of motion and neck supple.     Thyroid: No thyromegaly.  Cardiovascular:     Rate and Rhythm: Normal rate and regular rhythm.     Heart sounds: Normal heart sounds. No murmur.  Pulmonary:     Effort: Pulmonary  effort is normal. No respiratory distress.     Breath sounds: Normal breath sounds. No wheezing.     Comments: 3L O2 Abdominal:     General: Bowel sounds are normal. There is no distension.     Palpations: Abdomen is soft.     Tenderness: There is no abdominal tenderness.  Musculoskeletal:        General: No tenderness.     Right lower leg: Edema (2+) present.     Left lower leg: Edema (2+) present.     Comments: Generalized weakness, in wheelchair  Skin:    General: Skin is warm and dry.  Neurological:     Mental Status: She is alert and oriented to person, place, and time.     Cranial Nerves: No cranial nerve deficit.     Deep Tendon Reflexes: Reflexes are normal and symmetric.  Psychiatric:        Behavior: Behavior normal.        Thought Content: Thought content normal.        Judgment: Judgment normal.       BP 126/77   Pulse 66   Temp 97.6 F (36.4 C) (Oral)   Ht 5\' 8"  (1.727 m)   Wt 279 lb (126.6 kg)   BMI 42.42 kg/m      Assessment & Plan:  Lorraine Leonard comes in today with chief complaint of Face to Face for  a lift chair and referral back to Urology for history of urinary infections   Diagnosis and orders addressed:  1. Dysuria - Urinalysis, Complete  2. Pulmonary hypertension (Lorraine Leonard)  3. Acute diastolic CHF (congestive heart failure) (Lorraine Leonard)  4. Pulmonary fibrosis (Lorraine Leonard)  5. Chronic obstructive pulmonary disease, unspecified COPD type (Lorraine Leonard)  6. Morbid obesity (Lorraine Leonard)  7. Primary osteoarthritis of both hips  8. Recurrent UTI Force fluids AZO over the counter X2 days RTO prn Culture pending - Ambulatory referral to Urology - cephALEXin (KEFLEX) 500 MG capsule; Take 1 capsule (500 mg total) by mouth 2 (two) times daily.  Dispense: 14 capsule; Refill: 0  Script written for lift chair Keep all specialists appts    Lorraine Dun, FNP

## 2018-05-04 LAB — CUP PACEART REMOTE DEVICE CHECK
Battery Remaining Longevity: 133 mo
Battery Voltage: 3.04 V
Brady Statistic AP VP Percent: 7.25 %
Brady Statistic AP VS Percent: 0 %
Brady Statistic AS VP Percent: 0 %
Brady Statistic AS VS Percent: 92.75 %
Brady Statistic RA Percent Paced: 21.72 %
Brady Statistic RV Percent Paced: 7.25 %
Date Time Interrogation Session: 20191118143031
Implantable Lead Implant Date: 20190128
Implantable Lead Implant Date: 20190128
Implantable Lead Location: 753860
Implantable Lead Location: 753860
Implantable Lead Model: 3830
Implantable Lead Model: 5076
Implantable Pulse Generator Implant Date: 20190128
Lead Channel Impedance Value: 304 Ohm
Lead Channel Impedance Value: 380 Ohm
Lead Channel Impedance Value: 399 Ohm
Lead Channel Impedance Value: 551 Ohm
Lead Channel Sensing Intrinsic Amplitude: 11.125 mV
Lead Channel Sensing Intrinsic Amplitude: 11.125 mV
Lead Channel Sensing Intrinsic Amplitude: 2.5 mV
Lead Channel Sensing Intrinsic Amplitude: 2.5 mV
Lead Channel Setting Pacing Amplitude: 3.5 V
Lead Channel Setting Pacing Amplitude: 5 V
Lead Channel Setting Pacing Pulse Width: 0.4 ms
Lead Channel Setting Sensing Sensitivity: 0.9 mV

## 2018-05-04 LAB — URINE CULTURE

## 2018-05-05 ENCOUNTER — Other Ambulatory Visit: Payer: Self-pay | Admitting: Family

## 2018-05-07 ENCOUNTER — Encounter (INDEPENDENT_AMBULATORY_CARE_PROVIDER_SITE_OTHER): Payer: Medicare Other | Admitting: Ophthalmology

## 2018-05-07 DIAGNOSIS — H2701 Aphakia, right eye: Secondary | ICD-10-CM

## 2018-05-10 ENCOUNTER — Other Ambulatory Visit: Payer: Self-pay | Admitting: Family

## 2018-05-10 ENCOUNTER — Telehealth: Payer: Self-pay | Admitting: Family

## 2018-05-11 ENCOUNTER — Ambulatory Visit (INDEPENDENT_AMBULATORY_CARE_PROVIDER_SITE_OTHER): Payer: Medicare Other | Admitting: *Deleted

## 2018-05-11 VITALS — BP 130/60 | HR 71 | Ht 67.0 in | Wt 272.6 lb

## 2018-05-11 DIAGNOSIS — E1159 Type 2 diabetes mellitus with other circulatory complications: Secondary | ICD-10-CM

## 2018-05-11 DIAGNOSIS — I10 Essential (primary) hypertension: Secondary | ICD-10-CM | POA: Diagnosis not present

## 2018-05-11 DIAGNOSIS — I152 Hypertension secondary to endocrine disorders: Secondary | ICD-10-CM

## 2018-05-11 NOTE — Progress Notes (Signed)
Pt c/o being lightheaded and weak. States that she started feeling like this on yesterday. Pt reports that she fell at home recently but did not hit her head. Pt states family members helped her up. She c/o her ribs and arms being sore. No other complaints at this time.

## 2018-05-12 ENCOUNTER — Telehealth: Payer: Self-pay | Admitting: *Deleted

## 2018-05-12 DIAGNOSIS — Z79899 Other long term (current) drug therapy: Secondary | ICD-10-CM

## 2018-05-12 MED ORDER — FUROSEMIDE 40 MG PO TABS: ORAL_TABLET | ORAL | 3 refills | Status: DC

## 2018-05-12 NOTE — Progress Notes (Signed)
Vitals look fine, weight is down 7 lbs. Since she is having lightheadedness lets have her skip her lasix for 1 day, then start taking 40mg  alternating days with 60mg . Can she have a BMET/Mg this week   Zandra Abts MD

## 2018-05-12 NOTE — Telephone Encounter (Signed)
Vitals look fine, weight is down 7 lbs. Since she is having lightheadedness lets have her skip her lasix for 1 day, then start taking 40mg  alternating days with 60mg . Can she have a BMET/Mg this week   Zandra Abts MD  Patient notified and voiced understanding.

## 2018-05-12 NOTE — Telephone Encounter (Signed)
-----   Message from Arnoldo Lenis, MD sent at 05/12/2018 12:22 PM EST -----   ----- Message ----- From: Levonne Hubert, LPN Sent: 01/10/1940   4:36 PM EST To: Arnoldo Lenis, MD

## 2018-05-13 ENCOUNTER — Other Ambulatory Visit: Payer: Medicare Other

## 2018-05-13 ENCOUNTER — Other Ambulatory Visit: Payer: Self-pay | Admitting: Family

## 2018-05-13 DIAGNOSIS — J019 Acute sinusitis, unspecified: Secondary | ICD-10-CM

## 2018-05-14 LAB — MAGNESIUM: Magnesium: 2.2 mg/dL (ref 1.6–2.3)

## 2018-05-14 LAB — BASIC METABOLIC PANEL
BUN/Creatinine Ratio: 8 — ABNORMAL LOW (ref 12–28)
BUN: 13 mg/dL (ref 8–27)
CO2: 25 mmol/L (ref 20–29)
Calcium: 9.6 mg/dL (ref 8.7–10.3)
Chloride: 97 mmol/L (ref 96–106)
Creatinine, Ser: 1.72 mg/dL — ABNORMAL HIGH (ref 0.57–1.00)
GFR calc Af Amer: 33 mL/min/{1.73_m2} — ABNORMAL LOW (ref >59)
GFR calc non Af Amer: 28 mL/min/{1.73_m2} — ABNORMAL LOW (ref >59)
Glucose: 250 mg/dL — ABNORMAL HIGH (ref 65–99)
Potassium: 4.5 mmol/L (ref 3.5–5.2)
Sodium: 144 mmol/L (ref 134–144)

## 2018-05-17 ENCOUNTER — Telehealth: Payer: Self-pay

## 2018-05-17 NOTE — Telephone Encounter (Signed)
Called pt, no answer, left message for pt to return call.

## 2018-05-17 NOTE — Telephone Encounter (Signed)
-----   Message from Arnoldo Lenis, MD sent at 05/14/2018 11:42 AM EST ----- Labs confirm she is dehydrated. I would skip her lasix x 2 days, then restart the 60mg  alternating days with 40mg  we discussed. REpeat BMET /Mg in 1-2 weeks   Zandra Abts MD

## 2018-05-18 ENCOUNTER — Other Ambulatory Visit: Payer: Self-pay | Admitting: Family

## 2018-05-18 ENCOUNTER — Telehealth: Payer: Self-pay

## 2018-05-18 DIAGNOSIS — I5031 Acute diastolic (congestive) heart failure: Secondary | ICD-10-CM

## 2018-05-18 NOTE — Telephone Encounter (Signed)
Called pt, No answer. Left message for pt to return  Call. Mailed letter. Lab orders placed.

## 2018-05-18 NOTE — Telephone Encounter (Signed)
-----   Message from Arnoldo Lenis, MD sent at 05/14/2018 11:42 AM EST ----- Labs confirm she is dehydrated. I would skip her lasix x 2 days, then restart the 60mg  alternating days with 40mg  we discussed. REpeat BMET /Mg in 1-2 weeks   Zandra Abts MD

## 2018-05-20 DIAGNOSIS — N3020 Other chronic cystitis without hematuria: Secondary | ICD-10-CM | POA: Diagnosis not present

## 2018-05-21 NOTE — Telephone Encounter (Signed)
Patient received letter, will come next week to repeat labs. She is taking lasix 60 mg one day,alternationg with 40 mg the other

## 2018-05-24 ENCOUNTER — Telehealth: Payer: Self-pay | Admitting: Internal Medicine

## 2018-05-24 ENCOUNTER — Telehealth: Payer: Self-pay | Admitting: Family

## 2018-05-24 MED ORDER — AMOXICILLIN-POT CLAVULANATE 875-125 MG PO TABS: 1.0000 | ORAL_TABLET | Freq: Two times a day (BID) | ORAL | 0 refills | Status: DC

## 2018-05-24 NOTE — Telephone Encounter (Signed)
What symptoms do you have? uti  How long have you been sick? Long time  Have you been seen for this problem? no  If your provider decides to give you a prescription, which pharmacy would you like for it to be sent to? [facility] stated does not want to come in please call back   Patient informed that this information will be sent to the clinical staff for review and that they should receive a follow up call.

## 2018-05-24 NOTE — Telephone Encounter (Signed)
Returned call to pt. She will take medicines as ordered.

## 2018-05-24 NOTE — Telephone Encounter (Signed)
New Message   Pt is calling and is wondering if she can go to her [facility] office for her blood draw and she is also wondering if she needs to stop taking her Lasix 2 days prio to blood draw  Please call

## 2018-05-24 NOTE — Telephone Encounter (Signed)
Augmentin Prescription sent to pharmacy. You need to take this entire prescription.

## 2018-05-24 NOTE — Telephone Encounter (Signed)
Patient was seen 1/10 for dysuria and was given keflex. Patient states she is not any better and would like something else sent to pharmacy.  Declined being seen.

## 2018-05-24 NOTE — Telephone Encounter (Signed)
Patient no longer needs something called in.

## 2018-05-24 NOTE — Telephone Encounter (Signed)
Looks like pt is seen by Dr. Harl Bowie in [place] office

## 2018-05-24 NOTE — Telephone Encounter (Signed)
Patient aware per [facility] she needs to continue to take the Lasix.

## 2018-05-25 ENCOUNTER — Ambulatory Visit (INDEPENDENT_AMBULATORY_CARE_PROVIDER_SITE_OTHER): Payer: Medicare Other | Admitting: Family Medicine

## 2018-05-25 ENCOUNTER — Encounter: Payer: Self-pay | Admitting: Rheumatology

## 2018-05-25 ENCOUNTER — Encounter: Payer: Self-pay | Admitting: Family Medicine

## 2018-05-25 ENCOUNTER — Other Ambulatory Visit: Payer: Self-pay

## 2018-05-25 VITALS — BP 128/98 | HR 94 | Temp 99.9°F

## 2018-05-25 DIAGNOSIS — R531 Weakness: Secondary | ICD-10-CM

## 2018-05-25 DIAGNOSIS — R110 Nausea: Secondary | ICD-10-CM

## 2018-05-25 DIAGNOSIS — R350 Frequency of micturition: Secondary | ICD-10-CM

## 2018-05-25 DIAGNOSIS — R1084 Generalized abdominal pain: Secondary | ICD-10-CM | POA: Diagnosis not present

## 2018-05-25 DIAGNOSIS — R5383 Other fatigue: Secondary | ICD-10-CM | POA: Diagnosis not present

## 2018-05-25 NOTE — Progress Notes (Signed)
Subjective:  Patient ID: Lorraine Leonard, female    DOB: Jan 04, 1941, 78 y.o.   MRN: [id number]  Chief Complaint:  Diarrhea, nausea, frequent urination (x 3 days)   HPI: Lorraine Leonard is a 78 y.o. female presenting on 05/25/2018 for Diarrhea, nausea, frequent urination (x 3 days)  Pt presents today for abdominal pain, nausea, diarrhea, lethargy, fever, and urinary frequency. Pt unable to provide history as she is lethargic and hard to arouse. Husband states she has been this way for three days. Upon getting pt to arouse, she reports she thinks she overdosed on her medications. She states she has had urinary frequency and nausea. Pt states she does not know what medications she took as her granddaughter gets her medications ready for her. Husband states he does not think the pt overdosed on her medications. Husband states today she has been more difficult to arouse and she did not want to get out of the bed.   Relevant past medical, surgical, family, and social history reviewed and updated as indicated.  Allergies and medications reviewed and updated.   Past Medical History:  Diagnosis Date  . Acute diastolic CHF (congestive heart failure) ([place]) 11/11/2016  . Allergic rhinitis   . Anxiety   . Anxiety   . Arthritis    "~ all my joints" (05/18/2017)  . Atrial fibrillation ([place]) 09/2016  . Atrial fibrillation with RVR ([place]) 05/18/2017  . Chronic lower back pain   . COPD (chronic obstructive pulmonary disease) ([place])   . Depression   . Diverticulosis   . Dyspnea   . Dysrhythmia   . GERD (gastroesophageal reflux disease)   . Gout   . 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 Hyperlipidemia   . Hypertension   . Hypothyroidism   . Macular degeneration of both eyes   . Neuropathy   . On home oxygen therapy    "3L; 24/7" (05/18/2017)  . Pre-diabetes   . 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 Pulmonary fibrosis ([place])   . Pulmonary fibrosis  ([place])   . Rheumatoid arthritis ([place])   . Sleep apnea    uses 3 liters of o2 24/7  . Urticaria     Past Surgical History:  Procedure Laterality Date  . AV NODE ABLATION  05/18/2017  . AV NODE ABLATION N/A 05/18/2017   Procedure: AV NODE ABLATION;  Surgeon: Evans Lance, MD;  Location: [place] [facility];  Service: Cardiovascular;  Laterality: N/A;  . BACK SURGERY    . BREAST LUMPECTOMY Left   . CARDIOVERSION N/A 01/30/2017   Procedure: CARDIOVERSION;  Surgeon: Josue Hector, MD;  [facility];  Service: Cardiovascular;  Laterality: N/A;  . CARDIOVERSION N/A 04/09/2017   Procedure: CARDIOVERSION;  Surgeon: Satira Sark, MD;  Location: [facility];  Service: Cardiovascular;  Laterality: N/A;  . CATARACT EXTRACTION W/ INTRAOCULAR LENS  IMPLANT, BILATERAL Bilateral   . DILATION AND CURETTAGE OF UTERUS    . EUS N/A 10/03/2015   Procedure: UPPER ENDOSCOPIC ULTRASOUND (EUS) RADIAL;  Surgeon: Arta Silence, MD;  Location: [facility];  Service: Endoscopy;  Laterality: N/A;  . FINE NEEDLE ASPIRATION N/A 10/03/2015   Procedure: FINE NEEDLE ASPIRATION (FNA) RADIAL;  Surgeon: Arta Silence, MD;  Location: [facility];  Service: Endoscopy;  Laterality: N/A;  . INSERT / REPLACE / REMOVE PACEMAKER  05/18/2017  . [place]; 06/23/2008   L4-5/notes  08/20/2010  . [place]   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  . PACEMAKER IMPLANT N/A 05/18/2017   Procedure: PACEMAKER IMPLANT;  Surgeon: Evans Lance, MD;  Location: [facility];  Service: Cardiovascular;  Laterality: N/A;  . TOTAL ABDOMINAL HYSTERECTOMY    . TUBAL LIGATION      Social History   Socioeconomic History  . Marital status: Married    Spouse name: Not on file  . Number of children: 4  . Years of education: Not on file  . Highest education level: Not on file  Occupational History  . Occupation: retired    Comment: [employer]  Social Needs  . Financial resource strain: Not hard  at all  . Food insecurity:    Worry: Never true    Inability: Never true  . Transportation needs:    Medical: No    Non-medical: No  Tobacco Use  . Smoking status: Never Smoker  . Smokeless tobacco: Never Used  Substance and Sexual Activity  . Alcohol use: No  . Drug use: No  . Sexual activity: Not Currently    Birth control/protection: Surgical  Lifestyle  . Physical activity:    Days per week: 0 days    Minutes per session: 0 min  . Stress: Rather much  Relationships  . Social connections:    Talks on phone: More than three times a week    Gets together: More than three times a week    Attends religious service: Never    Active member of club or organization: No    Attends meetings of clubs or organizations: Never    Relationship status: Married  . Intimate partner violence:    Fear of current or ex partner: No    Emotionally abused: No    Physically abused: No    Forced sexual activity: No  Other Topics Concern  . 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   Pt lives with her husband in a single wide trailer. They have 4 children and 13 grandchildren.     Outpatient Encounter Medications as of 05/25/2018  Medication Sig  . albuterol (PROVENTIL) (2.5 MG/3ML) 0.083% nebulizer solution Take 3 mLs (2.5 mg total) by nebulization every 6 (six) hours as needed for wheezing or shortness of breath. J96.01  . azelastine (ASTELIN) 0.1 % nasal spray PLACE 2 SPRAYS INTO BOTH NOSTRILS 2 (TWO) TIMES DAILY AS NEEDED.  . calcium citrate (CALCITRATE - DOSED IN MG ELEMENTAL CALCIUM) 950 MG tablet Take 1 tablet by mouth 2 (two) times daily.   . Cholecalciferol 2000 units CAPS Take 2,000 Units by mouth daily.   . cyanocobalamin 1000 MCG tablet Take 1,000 mcg by mouth daily.   Marland Kitchen DEXILANT 60 MG capsule Take 60 mg by mouth daily.  . diazepam (VALIUM) 5 MG tablet TAKE 1 TABLET BY MOUTH TWICE A DAY AS NEEDED FOR ANXIETY  .  digoxin (LANOXIN) 0.125 MG tablet TAKE 1 TABLET BY MOUTH DAILY  . diltiazem (CARDIZEM CD) 180 MG 24 hr capsule TAKE 1 CAPSULE BY MOUTH TWICE A DAY  . docusate sodium (COLACE) 100 MG capsule Take 200 mg by mouth daily as needed for mild constipation.  Marland Kitchen ELIQUIS 5 MG TABS tablet TAKE 1 TABLET (5 MG TOTAL) BY MOUTH 2 (TWO) TIMES DAILY. RESUME 05/23/17  . EPINEPHrine 0.3 mg/0.3 mL IJ SOAJ injection Inject 0.3 mg into the muscle  once as needed (For anaphylaxis.).  Marland Kitchen ESTRACE VAGINAL 0.1 MG/GM vaginal cream Please specify directions, refills and quantity  . famotidine (PEPCID) 20 MG tablet Take 1 tablet (20 mg total) by mouth 2 (two) times daily.  Marland Kitchen FeFum-FePoly-FA-B Cmp-C-Biot (FOLIVANE-PLUS) CAPS Take 1 capsule by mouth 2 (two) times daily.  . fluticasone (FLONASE) 50 MCG/ACT nasal spray PLACE 1 SPRAY INTO BOTH NOSTRILS 2 (TWO) TIMES DAILY AS NEEDED FOR ALLERGIES OR RHINITIS.  . furosemide (LASIX) 40 MG tablet Take 40 mg (1 tablet)  alternating with 60 mg ( 1 1/2 Tablets) Daily  . hydrocortisone 2.5 % cream APPLIED TO AFFECTED AREAS 2 TIMES PER DAY FOR 1 WEEK. DO NOT APPLY TO FACE.  Marland Kitchen INCRUSE ELLIPTA 62.5 MCG/INH AEPB INHALE 1 PUFF BY MOUTH EVERY DAY  . levocetirizine (XYZAL) 5 MG tablet TAKE 1 TABLET BY MOUTH EVERY DAY IN THE EVENING  . levothyroxine (SYNTHROID) 112 MCG tablet Take 1 tablet (112 mcg total) by mouth daily.  . metFORMIN (GLUCOPHAGE XR) 500 MG 24 hr tablet Take 1 tablet (500 mg total) by mouth daily with breakfast.  . ondansetron (ZOFRAN) 8 MG tablet TAKE 1 TABLET EVERY 8 HOURS AS NEEDED FOR NAUSEA  . OVER THE COUNTER MEDICATION Place 1 drop into both eyes as needed (For dry eyes.). Systane Gel Drops  . PATADAY 0.2 % SOLN Place 1 drop into both eyes daily.  . polyethylene glycol powder (GLYCOLAX/MIRALAX) powder TAKE 17 G BY MOUTH 2 (TWO) TIMES DAILY AS NEEDED.  Marland Kitchen rosuvastatin (CRESTOR) 10 MG tablet TAKE 1 TABLET BY MOUTH EVERYDAY AT BEDTIME  . spironolactone (ALDACTONE) 25 MG tablet TAKE  1 TABLET BY MOUTH EVERY DAY  . traMADol (ULTRAM) 50 MG tablet Take 1 tablet (50 mg total) by mouth every 6 (six) hours as needed.  . trimethoprim (TRIMPEX) 100 MG tablet TAKE 1 TABLET BY MOUTH EVERY DAY  . VIGAMOX 0.5 % ophthalmic solution Apply 1 drop to eye as needed (after eye injection).   . vitamin C (ASCORBIC ACID) 500 MG tablet Take 1,000 mg by mouth daily.   . Vitamin D, Ergocalciferol, (DRISDOL) 1.25 MG (50000 UT) CAPS capsule TAKE 1 CAPSULE (50,000 UNITS TOTAL) BY MOUTH EVERY 7 (SEVEN) DAYS.  Marland Kitchen vitamin E 400 UNIT capsule Take 400 Units by mouth daily.  Marland Kitchen esomeprazole (NEXIUM) 40 MG capsule   . [DISCONTINUED] amoxicillin-clavulanate (AUGMENTIN) 875-125 MG tablet Take 1 tablet by mouth 2 (two) times daily.  . [DISCONTINUED] predniSONE (DELTASONE) 10 MG tablet Take 10 mg by mouth daily with breakfast.   No facility-administered encounter medications on file as of 05/25/2018.     Allergies  Allergen Reactions  . Allopurinol Swelling    Feet and legs swell  . Mobic [Meloxicam] Swelling and Other (See Comments)    Feet and Leg swelling    Review of Systems  Constitutional: Positive for activity change, appetite change, chills, fatigue and fever.  Eyes: Positive for visual disturbance.  Respiratory: Positive for shortness of breath. Negative for chest tightness.   Cardiovascular: Negative for chest pain, palpitations and leg swelling.  Gastrointestinal: Positive for abdominal pain, diarrhea and nausea. Negative for vomiting.  Genitourinary: Positive for frequency and urgency.  Neurological: Positive for weakness.  Psychiatric/Behavioral: Positive for confusion.        Objective:  BP (!) 128/98   Pulse 94   Temp 99.9 F (37.7 C) (Oral)   SpO2 96%    Wt Readings from Last 3 Encounters:  05/11/18 272 lb 9.6 oz (123.7 kg)  04/30/18 279 lb (126.6 kg)  04/27/18 279 lb (126.6 kg)    Physical Exam Vitals signs and nursing note reviewed.  Constitutional:      Appearance:  She is obese. She is ill-appearing. She is not toxic-appearing.  HENT:     Head: Normocephalic and atraumatic.  Eyes:     Pupils: Pupils are equal, round, and reactive to light.  Cardiovascular:     Rate and Rhythm: Normal rate. Rhythm irregularly irregular.     Heart sounds: No murmur. No friction rub. No gallop.   Pulmonary:     Breath sounds: Decreased air movement present. Examination of the right-lower field reveals rhonchi. Examination of the left-lower field reveals rhonchi. Rhonchi present.     Comments: O2 3L via [place] Abdominal:     General: Abdomen is protuberant. Bowel sounds are normal.     Palpations: Abdomen is soft.     Tenderness: There is generalized abdominal tenderness. There is no right CVA tenderness, left CVA tenderness, guarding or rebound. Negative signs include Murphy's sign and McBurney's sign.  Skin:    General: Skin is warm and dry.     Capillary Refill: Capillary refill takes less than 2 seconds.     Coloration: Skin is pale.  Neurological:     Mental Status: She is lethargic.     GCS: GCS eye subscore is 3. GCS verbal subscore is 4. GCS motor subscore is 6.     Results for orders placed or performed in visit on 05/12/18  Magnesium  Result Value Ref Range   Magnesium 2.2 1.6 - 2.3 mg/dL  Basic Metabolic Panel (BMET)  Result Value Ref Range   Glucose 250 (H) 65 - 99 mg/dL   BUN 13 8 - 27 mg/dL   Creatinine, Ser 1.72 (H) 0.57 - 1.00 mg/dL   GFR calc non Af Amer 28 (L) >59 mL/min/1.73   GFR calc Af Amer 33 (L) >59 mL/min/1.73   BUN/Creatinine Ratio 8 (L) 12 - 28   Sodium 144 134 - 144 mmol/L   Potassium 4.5 3.5 - 5.2 mmol/L   Chloride 97 96 - 106 mmol/L   CO2 25 20 - 29 mmol/L   Calcium 9.6 8.7 - 10.3 mg/dL       Pertinent labs & imaging results that were available during my care of the patient were reviewed by me and considered in my medical decision making.  Assessment & Plan:  Vallorie was seen today for diarrhea, nausea, frequent  urination.  Diagnoses and all orders for this visit:  Lethargic FSBS 227 in office -     Glucose Hemocue Waived  Generalized abdominal pain Urinary frequency Nausea General weakness  Labs deferred as pt is going to the ED for further evaluation and treatment.    Continue all other maintenance medications.  Follow up plan: Return for Pt going to [facility] for evaluation and treatment.  Pt transported to [facility] by EMS.   The above assessment and management plan was discussed with the patient. The patient verbalized understanding of and has agreed to the management plan. Patient is aware to call the clinic if symptoms persist or worsen. Patient is aware when to return to the clinic for a follow-up visit. Patient educated on when it is appropriate to go to the emergency department.   Monia Pouch, FNP-C [facility] [phone]

## 2018-05-26 LAB — GLUCOSE HEMOCUE WAIVED: Glu Hemocue Waived: 227 mg/dL — ABNORMAL HIGH (ref 65–99)

## 2018-05-31 ENCOUNTER — Telehealth: Payer: Self-pay | Admitting: Internal Medicine

## 2018-05-31 ENCOUNTER — Encounter: Payer: Self-pay | Admitting: *Deleted

## 2018-05-31 NOTE — Telephone Encounter (Signed)
New Message:      Pt was discharged from [facility] on 05-29-18. She have questions about her medicine. Two of her medicine was left off her discharge sheet. They were Diltiazem and Digoxin.She wants to know if the pt is supposed to be still them . Please call her asap,she wants to make sure she give the pt Her medicine right.

## 2018-05-31 NOTE — Telephone Encounter (Signed)
Will request records for [facility]

## 2018-06-01 NOTE — Telephone Encounter (Signed)
Records received and placed in Dr. Tanna Furry folder

## 2018-06-03 ENCOUNTER — Ambulatory Visit: Payer: Medicare Other | Admitting: Family

## 2018-06-03 NOTE — Telephone Encounter (Signed)
Spoke with pt who stated that she will not remember the name of medication to restart. Pt stated that she would like [place] to notify her granddaughter who prepares her medication.

## 2018-06-03 NOTE — Telephone Encounter (Signed)
Spoke with granddaughter. Given Dr. Tanna Furry note to "Start back Digoxin like she was taking. F/U with me in 3-4 wks. If BP ok , she will restart diltiazem when I see her."   Granddaughter voiced understanding.

## 2018-06-04 ENCOUNTER — Other Ambulatory Visit: Payer: Self-pay

## 2018-06-04 ENCOUNTER — Emergency Department (HOSPITAL_COMMUNITY)
Admission: EM | Admit: 2018-06-04 | Discharge: 2018-06-05 | Disposition: A | Discharge status: Home / Self Care | Payer: Medicare Other | Attending: Emergency Medicine | Admitting: Emergency Medicine

## 2018-06-04 DIAGNOSIS — Z79899 Other long term (current) drug therapy: Secondary | ICD-10-CM | POA: Insufficient documentation

## 2018-06-04 DIAGNOSIS — E119 Type 2 diabetes mellitus without complications: Secondary | ICD-10-CM | POA: Insufficient documentation

## 2018-06-04 DIAGNOSIS — I130 Hypertensive heart and chronic kidney disease with heart failure and stage 1 through stage 4 chronic kidney disease, or unspecified chronic kidney disease: Secondary | ICD-10-CM | POA: Insufficient documentation

## 2018-06-04 DIAGNOSIS — I4891 Unspecified atrial fibrillation: Secondary | ICD-10-CM

## 2018-06-04 DIAGNOSIS — E039 Hypothyroidism, unspecified: Secondary | ICD-10-CM | POA: Insufficient documentation

## 2018-06-04 DIAGNOSIS — I5031 Acute diastolic (congestive) heart failure: Secondary | ICD-10-CM | POA: Insufficient documentation

## 2018-06-04 DIAGNOSIS — Z7984 Long term (current) use of oral hypoglycemic drugs: Secondary | ICD-10-CM | POA: Insufficient documentation

## 2018-06-04 DIAGNOSIS — N183 Chronic kidney disease, stage 3 (moderate): Secondary | ICD-10-CM | POA: Diagnosis not present

## 2018-06-04 DIAGNOSIS — I480 Paroxysmal atrial fibrillation: Secondary | ICD-10-CM | POA: Diagnosis not present

## 2018-06-04 DIAGNOSIS — J449 Chronic obstructive pulmonary disease, unspecified: Secondary | ICD-10-CM | POA: Insufficient documentation

## 2018-06-04 DIAGNOSIS — R002 Palpitations: Secondary | ICD-10-CM | POA: Diagnosis present

## 2018-06-04 DIAGNOSIS — E876 Hypokalemia: Secondary | ICD-10-CM

## 2018-06-04 LAB — CBC
HCT: 38 % (ref 36.0–46.0)
Hemoglobin: 11.9 g/dL — ABNORMAL LOW (ref 12.0–15.0)
MCH: 29.6 pg (ref 26.0–34.0)
MCHC: 31.3 g/dL (ref 30.0–36.0)
MCV: 94.5 fL (ref 80.0–100.0)
Platelets: 259 10*3/uL (ref 150–400)
RBC: 4.02 MIL/uL (ref 3.87–5.11)
RDW: 14.6 % (ref 11.5–15.5)
WBC: 7.3 10*3/uL (ref 4.0–10.5)
nRBC: 0 % (ref 0.0–0.2)

## 2018-06-04 LAB — PHOSPHORUS: Phosphorus: 3.4 mg/dL (ref 2.5–4.6)

## 2018-06-04 LAB — COMPREHENSIVE METABOLIC PANEL
ALT: 25 U/L (ref 0–44)
AST: 39 U/L (ref 15–41)
Albumin: 3.1 g/dL — ABNORMAL LOW (ref 3.5–5.0)
Alkaline Phosphatase: 47 U/L (ref 38–126)
Anion gap: 11 (ref 5–15)
BUN: 6 mg/dL — ABNORMAL LOW (ref 8–23)
CO2: 30 mmol/L (ref 22–32)
Calcium: 9.1 mg/dL (ref 8.9–10.3)
Chloride: 101 mmol/L (ref 98–111)
Creatinine, Ser: 1.65 mg/dL — ABNORMAL HIGH (ref 0.44–1.00)
GFR calc Af Amer: 34 mL/min — ABNORMAL LOW (ref >60)
GFR calc non Af Amer: 30 mL/min — ABNORMAL LOW (ref >60)
Glucose, Bld: 137 mg/dL — ABNORMAL HIGH (ref 70–99)
Potassium: 2.9 mmol/L — ABNORMAL LOW (ref 3.5–5.1)
Sodium: 142 mmol/L (ref 135–145)
Total Bilirubin: 0.8 mg/dL (ref 0.3–1.2)
Total Protein: 6.1 g/dL — ABNORMAL LOW (ref 6.5–8.1)

## 2018-06-04 LAB — MAGNESIUM: Magnesium: 1.7 mg/dL (ref 1.7–2.4)

## 2018-06-04 LAB — DIGOXIN LEVEL: Digoxin Level: <0.2 ng/mL — ABNORMAL LOW (ref 0.8–2.0)

## 2018-06-04 MED ORDER — SODIUM CHLORIDE 0.9 % IV BOLUS
500.0000 mL | Freq: Once | INTRAVENOUS | Status: AC
  Administered 2018-06-04: 500 mL via INTRAVENOUS

## 2018-06-04 MED ORDER — ACETAMINOPHEN 325 MG PO TABS
650.0000 mg | ORAL_TABLET | Freq: Four times a day (QID) | ORAL | Status: DC | PRN
  Administered 2018-06-04: 650 mg via ORAL
  Filled 2018-06-04: qty 2

## 2018-06-04 MED ORDER — POTASSIUM CHLORIDE CRYS ER 20 MEQ PO TBCR
40.0000 meq | EXTENDED_RELEASE_TABLET | Freq: Once | ORAL | Status: AC
  Administered 2018-06-04: 40 meq via ORAL
  Filled 2018-06-04: qty 2

## 2018-06-04 MED ORDER — POTASSIUM CHLORIDE 10 MEQ/100ML IV SOLN
10.0000 meq | INTRAVENOUS | Status: AC
  Administered 2018-06-04 – 2018-06-05 (×2): 10 meq via INTRAVENOUS
  Filled 2018-06-04 (×2): qty 100

## 2018-06-04 MED ORDER — POTASSIUM CHLORIDE ER 10 MEQ PO TBCR: 10.0000 meq | EXTENDED_RELEASE_TABLET | Freq: Every day | ORAL | 0 refills | Status: DC

## 2018-06-04 NOTE — ED Provider Notes (Addendum)
[facility] EMERGENCY DEPARTMENT Provider Note   CSN: [id number] Arrival date & time: 06/04/18  1947     History   Chief Complaint Chief Complaint  Patient presents with  . Palpitations    Pacemaker    HPI Lorraine Leonard is a 78 y.o. female.  HPI Patient is a 78 year old female with a history of atrial fibrillation currently on digoxin.  She presents the emergency department complaints of acute palpitations today with associated shortness of breath.  Family reports that her heart rate was elevated when they checked it.  She has a Medtronic pacemaker.  She currently is on antibiotics for C. difficile colitis.  She reports her stools are more formed but she continues her antibiotics at this time.  She denies chest pain at this time.  No palpitations currently.  No fevers or chills.  Denies abdominal pain.  No significant shortness of breath.   Past Medical History:  Diagnosis Date  . Acute diastolic CHF (congestive heart failure) ([place]) 11/11/2016  . Allergic rhinitis   . Anxiety   . Anxiety   . Arthritis    "~ all my joints" (05/18/2017)  . Atrial fibrillation ([place]) 09/2016  . Atrial fibrillation with RVR ([place]) 05/18/2017  . Chronic lower back pain   . COPD (chronic obstructive pulmonary disease) ([place])   . Depression   . Diverticulosis   . Dyspnea   . Dysrhythmia   . GERD (gastroesophageal reflux disease)   . Gout   . 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 Hyperlipidemia   . Hypertension   . Hypothyroidism   . Macular degeneration of both eyes   . Neuropathy   . On home oxygen therapy    "3L; 24/7" (05/18/2017)  . Pre-diabetes   . 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 Pulmonary fibrosis ([place])   . Pulmonary fibrosis ([place])   . Rheumatoid arthritis ([place])   . Sleep apnea    uses 3 liters of o2 24/7  . Urticaria     Patient Active Problem List   Diagnosis Date Noted  . Chronic respiratory failure  with hypoxia ([place]) 03/26/2017  . COPD (chronic obstructive pulmonary disease) ([place]) 03/26/2017  . Atrial fibrillation ([place]) 02/09/2017  . Paroxysmal atrial fibrillation ([place]) 12/17/2016  . Black stool 12/17/2016  . Acute diastolic CHF (congestive heart failure) ([place]) 11/11/2016  . CKD (chronic kidney disease) stage 2, GFR 60-89 ml/min 11/11/2016  . Pulmonary fibrosis ([place]) 09/23/2016  . Diabetes mellitus ([place]) 07/17/2016  . Morbid obesity ([place]) 04/18/2016  . Snoring 11/09/2015  . Pulmonary hypertension ([place]) 11/09/2015  . Gout 10/19/2015  . Chronic urticaria 03/24/2015  . Metabolic syndrome 15/17/6160  . Single kidney 03/01/2015  . Macular degeneration of both eyes 11/28/2014  . Constipation 11/28/2014  . Chronic rhinitis 11/17/2014  . Hypertensive retinopathy 10/30/2014  . Vitamin D deficiency 08/17/2014  . Arthritis of knee 08/17/2014  . Hyperlipidemia associated with type 2 diabetes mellitus ([place]) 08/17/2014  . Depression   . Hypothyroidism   . Anxiety   . Cardiomegaly 05/05/2011  . Hypertension associated with diabetes ([place]) 02/08/2007  . 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 GASTROESOPHAGEAL REFLUX DISEASE 02/08/2007    Past Surgical History:  Procedure Laterality Date  . AV NODE ABLATION  05/18/2017  . AV NODE ABLATION N/A 05/18/2017   Procedure: AV NODE ABLATION;  Surgeon: Evans Lance, MD;  Location: [facility];  Service: Cardiovascular;  Laterality: N/A;  . BACK SURGERY    . BREAST LUMPECTOMY Left   . CARDIOVERSION N/A 01/30/2017   Procedure: CARDIOVERSION;  Surgeon: Josue Hector, MD;  [facility];  Service: Cardiovascular;  Laterality: N/A;  . CARDIOVERSION N/A 04/09/2017   Procedure: CARDIOVERSION;  Surgeon: Satira Sark, MD;  [facility]: [facility];  Service: Cardiovascular;  Laterality: N/A;  . CATARACT EXTRACTION W/ INTRAOCULAR LENS  IMPLANT, BILATERAL Bilateral   . DILATION AND CURETTAGE OF UTERUS    . EUS N/A 10/03/2015    Procedure: UPPER ENDOSCOPIC ULTRASOUND (EUS) RADIAL;  Surgeon: Arta Silence, MD;  Location: [facility];  Service: Endoscopy;  Laterality: N/A;  . FINE NEEDLE ASPIRATION N/A 10/03/2015   Procedure: FINE NEEDLE ASPIRATION (FNA) RADIAL;  Surgeon: Arta Silence, MD;  Location: [facility];  Service: Endoscopy;  Laterality: N/A;  . INSERT / REPLACE / REMOVE PACEMAKER  05/18/2017  . LUMBAR LAMINECTOMY  1995; 06/23/2008   L4-5/notes 08/20/2010  . [place]   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  . PACEMAKER IMPLANT N/A 05/18/2017   Procedure: PACEMAKER IMPLANT;  Surgeon: Evans Lance, MD;  Location: [facility];  Service: Cardiovascular;  Laterality: N/A;  . TOTAL ABDOMINAL HYSTERECTOMY    . TUBAL LIGATION       OB History   No obstetric history on file.      Home Medications    Prior to Admission medications   Medication Sig Start Date End Date Taking? Authorizing Provider  acetaminophen (TYLENOL) 500 MG tablet Take 500 mg by mouth every 6 (six) hours as needed (pain).   Yes [provider]  albuterol (PROVENTIL) (2.5 MG/3ML) 0.083% nebulizer solution Take 3 mLs (2.5 mg total) by nebulization every 6 (six) hours as needed for wheezing or shortness of breath. J96.01 02/20/17  Yes Hawks, Christy A, FNP  azelastine (ASTELIN) 0.1 % nasal spray PLACE 2 SPRAYS INTO BOTH NOSTRILS 2 (TWO) TIMES DAILY AS NEEDED. Patient taking differently: Place 2 sprays into both nostrils at bedtime.  10/01/17  Yes Hawks, Christy A, FNP  calcium citrate (CALCITRATE - DOSED IN MG ELEMENTAL CALCIUM) 950 MG tablet Take 1 tablet by mouth 2 (two) times daily.    Yes [provider]  cyanocobalamin 1000 MCG tablet Take 1,000 mcg by mouth daily. Vitamin b12   Yes [provider]  diazepam (VALIUM) 5 MG tablet TAKE 1 TABLET BY MOUTH TWICE A DAY AS NEEDED FOR ANXIETY Patient taking differently: Take 5 mg by mouth 2 (two) times daily as needed for anxiety.  04/19/18  Yes  Dettinger, Fransisca Kaufmann, MD  digoxin (LANOXIN) 0.125 MG tablet TAKE 1 TABLET BY MOUTH DAILY Patient taking differently: Take 0.125 mg by mouth daily.  04/05/18  Yes Evans Lance, MD  docusate sodium (COLACE) 100 MG capsule Take 200 mg by mouth daily as needed for mild constipation.   Yes [provider]  ELIQUIS 5 MG TABS tablet TAKE 1 TABLET (5 MG TOTAL) BY MOUTH 2 (TWO) TIMES DAILY. RESUME 05/23/17 Patient taking differently: Take 5 mg by mouth 2 (two) times daily.  03/10/18  Yes Hawks, Christy A, FNP  EPINEPHrine 0.3 mg/0.3 mL IJ SOAJ injection Inject 0.3 mg into the muscle once as needed (For anaphylaxis.).   Yes [provider]  esomeprazole (NEXIUM) 40 MG capsule Take 40 mg by mouth daily.  05/21/18  Yes [provider]  ESTRACE VAGINAL 0.1 MG/GM vaginal cream Please specify directions, refills and quantity  Patient taking differently: Place 1 Applicatorful vaginally every Wednesday.  04/05/18  Yes Hawks, Christy A, FNP  fluticasone (FLONASE) 50 MCG/ACT nasal spray PLACE 1 SPRAY INTO BOTH NOSTRILS 2 (TWO) TIMES DAILY AS NEEDED FOR ALLERGIES OR RHINITIS. Patient taking differently: Place 1 spray into both nostrils daily.  05/14/18  Yes Hawks, Christy A, FNP  furosemide (LASIX) 40 MG tablet Take 40 mg (1 tablet)  alternating with 60 mg ( 1 1/2 Tablets) Daily Patient taking differently: Take 40-60 mg by mouth See admin instructions. Take one tablet (40 mg) by mouth 1st day, then take 1 1/2 tablets (60 mg) 2nd day, then repeat 05/12/18  Yes Branch, Alphonse Guild, MD  hydrocortisone 2.5 % cream APPLIED TO AFFECTED AREAS 2 TIMES PER DAY FOR 1 WEEK. DO NOT APPLY TO FACE. Patient taking differently: Apply 1 application topically 2 (two) times daily as needed (itching). Do not apply to face. 09/30/17  Yes Hawks, Christy A, FNP  INCRUSE ELLIPTA 62.5 MCG/INH AEPB INHALE 1 PUFF BY MOUTH EVERY DAY Patient taking differently: Inhale 1 puff into the lungs daily.  03/02/18  Yes Hawks, Christy  A, FNP  levocetirizine (XYZAL) 5 MG tablet TAKE 1 TABLET BY MOUTH EVERY DAY IN THE EVENING Patient taking differently: Take 5 mg by mouth at bedtime.  05/06/18  Yes Hawks, Christy A, FNP  levothyroxine (SYNTHROID) 112 MCG tablet Take 1 tablet (112 mcg total) by mouth daily. 04/15/18 04/15/19 Yes Hawks, Alyse Low A, FNP  metFORMIN (GLUCOPHAGE XR) 500 MG 24 hr tablet Take 1 tablet (500 mg total) by mouth daily with breakfast. 03/02/18 03/02/19 Yes Hawks, Christy A, FNP  Multiple Vitamins-Minerals (PRESERVISION AREDS 2) CAPS Take 1 capsule by mouth 2 (two) times daily.   Yes [provider]  Olopatadine HCl (PATADAY) 0.2 % SOLN Place 1 drop into both eyes daily.   Yes [provider]  ondansetron (ZOFRAN) 8 MG tablet TAKE 1 TABLET EVERY 8 HOURS AS NEEDED FOR NAUSEA Patient taking differently: Take 8 mg by mouth every 8 (eight) hours as needed for nausea or vomiting.  04/07/18  Yes Hawks, Christy A, FNP  OXYGEN Inhale 3 L into the lungs continuous.   Yes [provider]  Polyethyl Glycol-Propyl Glycol (SYSTANE) 0.4-0.3 % GEL ophthalmic gel Place 1 application into both eyes at bedtime as needed (dry eyes).   Yes [provider]  polyethylene glycol powder (GLYCOLAX/MIRALAX) powder TAKE 17 G BY MOUTH 2 (TWO) TIMES DAILY AS NEEDED. Patient taking differently: Take 17 g by mouth 2 (two) times daily as needed (constipation).  01/19/17  Yes Hawks, Christy A, FNP  rosuvastatin (CRESTOR) 10 MG tablet TAKE 1 TABLET BY MOUTH EVERYDAY AT BEDTIME Patient taking differently: Take 10 mg by mouth at bedtime.  05/06/18  Yes Hawks, Christy A, FNP  spironolactone (ALDACTONE) 25 MG tablet TAKE 1 TABLET BY MOUTH EVERY DAY Patient taking differently: Take 25 mg by mouth daily.  05/06/18  Yes Hawks, Christy A, FNP  traMADol (ULTRAM) 50 MG tablet Take 1 tablet (50 mg total) by mouth every 6 (six) hours as needed. 09/03/17  Yes Hawks, Christy A, FNP  trimethoprim (TRIMPEX) 100 MG tablet TAKE 1  TABLET BY MOUTH EVERY DAY Patient taking differently: Take 100 mg by mouth daily. Continuous course 05/11/18  Yes Hawks, Christy A, FNP  vancomycin (VANCOCIN) 125 MG capsule Take 125 mg by mouth 4 (four) times daily. 10 day course filled 05/29/18 05/29/18  Yes [provider]  vitamin C (ASCORBIC ACID) 500 MG  tablet Take 1,000 mg by mouth daily.    Yes [provider]  Vitamin D, Ergocalciferol, (DRISDOL) 1.25 MG (50000 UT) CAPS capsule TAKE 1 CAPSULE (50,000 UNITS TOTAL) BY MOUTH EVERY 7 (SEVEN) DAYS. Patient taking differently: Take 50,000 Units by mouth every Wednesday.  03/30/18  Yes Hawks, Christy A, FNP  vitamin E 400 UNIT capsule Take 400 Units by mouth daily.   Yes [provider]  diltiazem (CARDIZEM CD) 180 MG 24 hr capsule TAKE 1 CAPSULE BY MOUTH TWICE A DAY Patient taking differently: Take 180 mg by mouth 2 (two) times daily.  04/05/18   Sharion Balloon, FNP  famotidine (PEPCID) 20 MG tablet Take 1 tablet (20 mg total) by mouth 2 (two) times daily. Patient not taking: Reported on 06/04/2018 09/28/17   Francine Graven, DO    Family History Family History  Problem Relation Age of Onset  . Diabetes Other   . Hypertension Other   . Coronary artery disease Other   . Stroke Other   . Asthma Other   . Kidney disease Sister   . Cancer Sister        lung  . Lung cancer Sister   . Cancer Sister        lung  . Lung cancer Sister   . Cancer Sister        lung  . Suicidality Son        committed suicide in 2009  . CVA Son   . Hyperlipidemia Other   . Anxiety disorder Other   . Migraines Other   . Heart failure Father   . Healthy Brother   . Cancer Sister        lung  . Healthy Sister     Social History Social History   Tobacco Use  . Smoking status: Never Smoker  . Smokeless tobacco: Never Used  Substance Use Topics  . Alcohol use: No  . Drug use: No     Allergies   Allopurinol and Mobic [meloxicam]   Review of Systems Review of  Systems  All other systems reviewed and are negative.    Physical Exam Updated Vital Signs BP 114/60 (BP Location: Left Arm)   Pulse 92   Temp 98.6 F (37 C) (Oral)   Resp 16   Ht 5\' 8"  (1.727 m)   Wt 125.2 kg   SpO2 100%   BMI 41.97 kg/m   Physical Exam Vitals signs and nursing note reviewed.  Constitutional:      General: She is not in acute distress.    Appearance: She is well-developed.  HENT:     Head: Normocephalic and atraumatic.  Neck:     Musculoskeletal: Normal range of motion.  Cardiovascular:     Rate and Rhythm: Normal rate and regular rhythm.     Heart sounds: Normal heart sounds.  Pulmonary:     Effort: Pulmonary effort is normal.     Breath sounds: Normal breath sounds.  Abdominal:     General: There is no distension.     Palpations: Abdomen is soft.     Tenderness: There is no abdominal tenderness.  Musculoskeletal: Normal range of motion.  Skin:    General: Skin is warm and dry.  Neurological:     Mental Status: She is alert and oriented to person, place, and time.  Psychiatric:        Judgment: Judgment normal.      ED Treatments / Results  Labs (all labs ordered are  listed, but only abnormal results are displayed) Labs Reviewed  CBC - Abnormal; Notable for the following components:      Result Value   Hemoglobin 11.9 (*)    All other components within normal limits  COMPREHENSIVE METABOLIC PANEL - Abnormal; Notable for the following components:   Potassium 2.9 (*)    Glucose, Bld 137 (*)    BUN 6 (*)    Creatinine, Ser 1.65 (*)    Total Protein 6.1 (*)    Albumin 3.1 (*)    GFR calc non Af Amer 30 (*)    GFR calc Af Amer 34 (*)    All other components within normal limits  DIGOXIN LEVEL - Abnormal; Notable for the following components:   Digoxin Level <0.2 (*)    All other components within normal limits  MAGNESIUM  PHOSPHORUS    EKG EKG Interpretation  Date/Time:  Friday June 04 2018 20:12:17 EST Ventricular Rate:    88 PR Interval:    QRS Duration: 89 QT Interval:  342 QTC Calculation: 414 R Axis:   46 Text Interpretation:  Atrial fibrillation Probable anterior infarct, age indeterminate Abnormal T, consider ischemia, diffuse leads Lateral leads are also involved No significant change was found Confirmed by Jola Schmidt [phone]) on 06/04/2018 10:56:00 PM   Radiology No results found.  Procedures Procedures (including critical care time)  Medications Ordered in ED Medications  acetaminophen (TYLENOL) tablet 650 mg (650 mg Oral Given 06/04/18 2139)  potassium chloride 10 mEq in 100 mL IVPB (has no administration in time range)  potassium chloride SA (K-DUR,KLOR-CON) CR tablet 40 mEq (has no administration in time range)  sodium chloride 0.9 % bolus 500 mL (0 mLs Intravenous Stopped 06/04/18 2123)     Initial Impression / Assessment and Plan / ED Course  I have reviewed the triage vital signs and the nursing notes.  Pertinent labs & imaging results that were available during my care of the patient were reviewed by me and considered in my medical decision making (see chart for details).     Patient had her pacemaker interrogated here in the emergency department.  She had several episodes of rapid A. fib with heart rates up in the 160s and 70s.  She feels much better at this time.  She is found to be hypokalemic with a potassium of 2.9.  She will receive IV and oral replacement here in the emergency department and the plan will be to discharge home on oral potassium replacement likely from GI loss.  Plan for close primary care follow-up.  Final Clinical Impressions(s) / ED Diagnoses   Final diagnoses:  None    ED Discharge Orders    None       Jola Schmidt, MD 06/04/18 [id number]    Jola Schmidt, MD 06/09/18 [phone]

## 2018-06-04 NOTE — ED Notes (Signed)
Patient states she get SOB when she gets up an moves around.  Lungs clear and equal, no abnormal heart tones detected, no Periferal edema noted.   Pt stated she was treated a week ago for c-diff, and now her stool is formed, yet soft. Pt states at current, nothing is bothering her and no discomfort.

## 2018-06-04 NOTE — ED Notes (Signed)
Patient's pacemaker interrogated using [employer]tr[employer] .

## 2018-06-05 DIAGNOSIS — I480 Paroxysmal atrial fibrillation: Secondary | ICD-10-CM | POA: Diagnosis not present

## 2018-06-07 ENCOUNTER — Ambulatory Visit (INDEPENDENT_AMBULATORY_CARE_PROVIDER_SITE_OTHER): Payer: Medicare Other

## 2018-06-07 DIAGNOSIS — I4891 Unspecified atrial fibrillation: Secondary | ICD-10-CM | POA: Diagnosis not present

## 2018-06-08 ENCOUNTER — Telehealth: Payer: Self-pay | Admitting: Family

## 2018-06-08 LAB — CUP PACEART REMOTE DEVICE CHECK
Battery Remaining Longevity: 143 mo
Battery Voltage: 3.02 V
Brady Statistic AP VP Percent: 0.91 %
Brady Statistic AP VS Percent: 26.98 %
Brady Statistic AS VP Percent: 0 %
Brady Statistic AS VS Percent: 72.12 %
Brady Statistic RA Percent Paced: 74.75 %
Brady Statistic RV Percent Paced: 0.91 %
Date Time Interrogation Session: 20200215014721
Implantable Lead Implant Date: 20190128
Implantable Lead Implant Date: 20190128
Implantable Lead Location: 753860
Implantable Lead Location: 753860
Implantable Lead Model: 3830
Implantable Lead Model: 5076
Implantable Pulse Generator Implant Date: 20190128
Lead Channel Impedance Value: 285 Ohm
Lead Channel Impedance Value: 399 Ohm
Lead Channel Impedance Value: 399 Ohm
Lead Channel Impedance Value: 513 Ohm
Lead Channel Sensing Intrinsic Amplitude: 14.75 mV
Lead Channel Sensing Intrinsic Amplitude: 14.75 mV
Lead Channel Sensing Intrinsic Amplitude: 2.375 mV
Lead Channel Sensing Intrinsic Amplitude: 2.375 mV
Lead Channel Setting Pacing Amplitude: 2.5 V
Lead Channel Setting Pacing Amplitude: 3.5 V
Lead Channel Setting Pacing Pulse Width: 0.4 ms
Lead Channel Setting Sensing Sensitivity: 0.9 mV

## 2018-06-08 NOTE — Telephone Encounter (Signed)
Aware.  Rescheduled to an earlier time.

## 2018-06-11 ENCOUNTER — Ambulatory Visit (INDEPENDENT_AMBULATORY_CARE_PROVIDER_SITE_OTHER): Payer: Medicare Other | Admitting: Family

## 2018-06-11 ENCOUNTER — Encounter: Payer: Self-pay | Admitting: Family

## 2018-06-11 VITALS — BP 124/55 | HR 88 | Temp 97.1°F | Ht 68.0 in | Wt 259.6 lb

## 2018-06-11 DIAGNOSIS — Z09 Encounter for follow-up examination after completed treatment for conditions other than malignant neoplasm: Secondary | ICD-10-CM | POA: Diagnosis not present

## 2018-06-11 DIAGNOSIS — E876 Hypokalemia: Secondary | ICD-10-CM

## 2018-06-11 DIAGNOSIS — A0472 Enterocolitis due to Clostridium difficile, not specified as recurrent: Secondary | ICD-10-CM

## 2018-06-11 DIAGNOSIS — Z87440 Personal history of urinary (tract) infections: Secondary | ICD-10-CM

## 2018-06-11 DIAGNOSIS — A09 Infectious gastroenteritis and colitis, unspecified: Secondary | ICD-10-CM

## 2018-06-11 DIAGNOSIS — I4891 Unspecified atrial fibrillation: Secondary | ICD-10-CM

## 2018-06-11 LAB — URINALYSIS, COMPLETE
Bilirubin, UA: NEGATIVE
Glucose, UA: NEGATIVE
Ketones, UA: NEGATIVE
Leukocytes, UA: NEGATIVE
Nitrite, UA: NEGATIVE
Protein, UA: NEGATIVE
RBC, UA: NEGATIVE
Specific Gravity, UA: 1.015 (ref 1.005–1.030)
Urobilinogen, Ur: 0.2 mg/dL (ref 0.2–1.0)
pH, UA: 6.5 (ref 5.0–7.5)

## 2018-06-11 LAB — MICROSCOPIC EXAMINATION
Bacteria, UA: NONE SEEN
RBC, UA: NONE SEEN /hpf (ref 0–2)
Renal Epithel, UA: NONE SEEN /hpf

## 2018-06-11 NOTE — Progress Notes (Signed)
Subjective:    Patient ID: Lorraine Leonard, female    DOB: 08/08/1940, 78 y.o.   MRN: [id number]  Chief Complaint  Patient presents with  . Hospitalization Follow-up   Pt presents to the office today for hospital follow up. She went to [facility] on 05/25/18 with diarrhea and was diagnosed with C Diff. She was discharged on 05/29/18.   She went back to the ED on 06/04/18 with SOB and palpations. She has A Fib and taking digoxin. She has Pacemaker in place. She found to be hypokalemic with a K+ of 2.9. She received IV replacement. She was discharged home with oral potassium.   Diarrhea   The current episode started 1 to 4 weeks ago. The problem occurs 5 to 10 times per day. The problem has been unchanged. The stool consistency is described as watery. The patient states that diarrhea awakens her from sleep. Associated symptoms include abdominal pain and increased flatus. Pertinent negatives include no bloating or vomiting.   [facility] notes reviewed   Review of Systems  Gastrointestinal: Positive for abdominal pain, diarrhea and flatus. Negative for bloating and vomiting.  All other systems reviewed and are negative.      Objective:   Physical Exam Vitals signs reviewed.  Constitutional:      General: She is not in acute distress.    Appearance: She is well-developed.  HENT:     Head: Normocephalic and atraumatic.     Right Ear: Tympanic membrane normal.     Left Ear: Tympanic membrane normal.  Eyes:     Pupils: Pupils are equal, round, and reactive to light.  Neck:     Musculoskeletal: Normal range of motion and neck supple.     Thyroid: No thyromegaly.  Cardiovascular:     Rate and Rhythm: Normal rate. Rhythm irregular.     Heart sounds: Normal heart sounds. No murmur.  Pulmonary:     Effort: Pulmonary effort is normal. No respiratory distress.     Breath sounds: Decreased breath sounds present. No wheezing.     Comments: 3L of continuous oxygen  Abdominal:   General: Bowel sounds are normal. There is no distension.     Palpations: Abdomen is soft.     Tenderness: There is no abdominal tenderness.  Musculoskeletal:        General: No tenderness.     Comments: Generalized weakness, in wheelchair  Skin:    General: Skin is warm and dry.  Neurological:     Mental Status: She is alert and oriented to person, place, and time.     Cranial Nerves: No cranial nerve deficit.     Deep Tendon Reflexes: Reflexes are normal and symmetric.  Psychiatric:        Behavior: Behavior normal.        Thought Content: Thought content normal.        Judgment: Judgment normal.       BP (!) 124/55   Pulse 88   Temp (!) 97.1 F (36.2 C)   Ht 5' 8" (1.727 m)   Wt 259 lb 9.6 oz (117.8 kg)   BMI 39.47 kg/m      Assessment & Plan:  Lorraine Leonard comes in today with chief complaint of Hospitalization Follow-up   Diagnosis and orders addressed:  1. Hospital discharge follow-up - CMP14+EGFR - CBC with Differential/Platelet - Clostridium Difficile by PCR  2. History of bladder infections - Urinalysis, Complete - CMP14+EGFR - CBC with Differential/Platelet  3. Hypokalemia -  CMP14+EGFR - CBC with Differential/Platelet  4. C. difficile colitis - CMP14+EGFR - CBC with Differential/Platelet - Clostridium Difficile by PCR  5. Diarrhea of infectious origin - CMP14+EGFR - CBC with Differential/Platelet - Clostridium Difficile by PCR  6. Atrial fibrillation, unspecified type [facility])   Labs pending Health Maintenance reviewed Diet and exercise encouraged  Follow up plan: Keep follow up appt    Evelina Dun, FNP

## 2018-06-11 NOTE — Patient Instructions (Signed)
Clostridium Difficile Infection  Clostridium difficile (C. difficile or C. diff) infection is a condition that occurs when an overgrowth of C. diff bacteria causes irritation and swelling (inflammation) of the colon (colitis).  This infection can be passed from person to person (can be contagious). You may also be exposed to the bacteria from the environment, such as from food or water or from touching surfaces that have the bacteria on them.  What are the causes?  Certain bacteria normally live in the colon and help to digest food. This infection develops when the balance of bacteria in the colon is changed and C. diff grows out of control. This is often caused by taking antibiotics.  What increases the risk?  This condition is more likely to develop in people who:  · Need to take antibiotics for a long period of time.  · Take certain antibiotics that kill a wide range of bacteria.  · Are in the hospital.  · Are older.  · Live in a place where there is a lot of contact with others, such as a nursing home.  · Have had a C. diff infection before.  · Have a weak defense (immune) system.  · Take a medicine called proton pump inhibitors over a long period of time (chronic use).  · Have serious underlying conditions, such as colon cancer.  · Have had gastrointestinal (GI) tract procedure or surgery.  What are the signs or symptoms?  Symptoms of this condition include:  · Diarrhea. This may be bloody, watery, or yellow or green in color.  · Fever.  · Fatigue.  · Loss of appetite.  · Nausea.  · Swelling, pain, or tenderness in the abdomen.  How is this diagnosed?  This condition is diagnosed with medical history and physical exam. You may also have other tests, including:  · A test that checks for C. diff in your stool.  · Blood tests.  · Imaging tests, such as a CT scan of your abdomen.  In rare cases, a sigmoidoscopy or colonoscopy may be used to look directly into your colon. These procedures involve passing an  instrument through your rectum to look at the inside of your colon.  How is this treated?  Treatment for this condition includes:  · Stopping the antibiotics that you were on when the C. diff infection began. Do this only as told by your health care provider.  · Taking certain antibiotics to stop C. diff from growing.  · Taking donor stool from a healthy person and placing it into the colon (fecal transplant) through a colonoscope or nasogastric tube. This may be done if you have a recurrent infection.  · Having surgery to remove the infected part of the colon (rare).  Follow these instructions at home:  Eating and drinking    · Eat bland, easy-to-digest foods in small amounts as you are able. These foods include bananas, applesauce, rice, lean meats, toast, and crackers.  · Follow specific instructions on how to get enough water into your body (rehydrate). This may include:  ? Drinking clear fluids, such as water, ice chips, diluted fruit juice, and low-calorie sports drinks.  ? Taking an oral rehydration solution (ORS). This is a drink that is sold at pharmacies and retail stores.  · Avoid milk, caffeine, and alcohol.  · Drink enough fluid to keep your urine pale yellow.  General instructions  · Take over-the-counter and prescription medicines only as told by your health care provider.  ·   Take your antibiotic medicine as told by your health care provider. Do not stop taking the antibiotic, even if you start to feel better. You may stop taking it only if your health care provider tells you to stop.  · Wash your hands with soap and water. If soap and water are not available, use hand sanitizer.  · Do not use medicines to help with diarrhea.  · Keep all follow-up visits as told by your health care provider. This is important.  How is this prevented?  Hand hygiene    · Use soap and water to wash your hands thoroughly before you prepare food and after you use the bathroom. Make sure that people who live with you also  wash their hands often.  · If you are being treated at a hospital or clinic, make sure that all health care providers wash their hands with soap and water before touching you.  · If you are in the hospital, make sure that all visitors wash their hands with soap and water before touching you.  Contact precautions  · If you develop diarrhea while in the hospital or a long-term care facility, let your health care team know right away.  · When visiting someone in the hospital or a long-term care facility, follow guidelines for wearing a gown, gloves, or other protective equipment.  · If possible, avoid contact with people who have diarrhea.  Clean environment  · Clean surfaces with a product that contains chlorine bleach.  · If you are in the hospital, make sure that staff members clean the surfaces in your room daily. Let a staff person know right away if body fluids have splashed or spilled in your room.  Contact a health care provider if:  · Your symptoms do not get better with treatment.  · Your symptoms get worse, even with treatment.  · Your symptoms go away and then come back.  · You have a fever.  · You have new symptoms.  Get help right away if:  · You have more pain or tenderness in your abdomen.  · You have stool that is mostly bloody, or your stool looks dark black and tarry.  · You cannot eat or drink without vomiting.  · You have signs or symptoms of dehydration, such as:  ? Dark urine, very little urine, or no urine.  ? Cracked lips.  ? Not making tears when you cry.  ? Dry mouth.  ? Sunken eyes.  ? Sleepiness.  ? Weakness.  ? Dizziness.  Summary  · Clostridium difficile (C. difficile or C. diff) infection is a condition that occurs when an overgrowth of C. diff bacteria causes irritation and swelling (inflammation) of the colon.  · Symptoms of C. diff infection include diarrhea, fever, fatigue, loss of appetite, nausea, and swelling, pain, or tenderness in the abdomen.  · C. diff infection is treated by  stopping the antibiotics you were on when the C. diff infection began, and then taking certain antibiotics to keep C. diff from growing. In some cases, fecal transplant or surgery is performed.  · Hand washing with soap and water, contact precautions, and a clean environment can help prevent or limit the spread of C. diff infection.  This information is not intended to replace advice given to you by your health care provider. Make sure you discuss any questions you have with your health care provider.  Document Released: 01/15/2005 Document Revised: 01/14/2017 Document Reviewed: 01/14/2017  Elsevier Interactive Patient Education ©

## 2018-06-12 LAB — CBC WITH DIFFERENTIAL/PLATELET
Basophils Absolute: 0.1 10*3/uL (ref 0.0–0.2)
Basos: 1 %
EOS (ABSOLUTE): 0.3 10*3/uL (ref 0.0–0.4)
Eos: 3 %
Hematocrit: 43.3 % (ref 34.0–46.6)
Hemoglobin: 13.7 g/dL (ref 11.1–15.9)
Immature Grans (Abs): 0 10*3/uL (ref 0.0–0.1)
Immature Granulocytes: 0 %
Lymphocytes Absolute: 3.2 10*3/uL — ABNORMAL HIGH (ref 0.7–3.1)
Lymphs: 33 %
MCH: 30.1 pg (ref 26.6–33.0)
MCHC: 31.6 g/dL (ref 31.5–35.7)
MCV: 95 fL (ref 79–97)
Monocytes Absolute: 1.1 10*3/uL — ABNORMAL HIGH (ref 0.1–0.9)
Monocytes: 12 %
Neutrophils Absolute: 5 10*3/uL (ref 1.4–7.0)
Neutrophils: 51 %
Platelets: 301 10*3/uL (ref 150–450)
RBC: 4.55 x10E6/uL (ref 3.77–5.28)
RDW: 14.3 % (ref 11.7–15.4)
WBC: 9.7 10*3/uL (ref 3.4–10.8)

## 2018-06-12 LAB — CMP14+EGFR
ALT: 30 IU/L (ref 0–32)
AST: 48 IU/L — ABNORMAL HIGH (ref 0–40)
Albumin/Globulin Ratio: 1.3 (ref 1.2–2.2)
Albumin: 4.2 g/dL (ref 3.7–4.7)
Alkaline Phosphatase: 67 IU/L (ref 39–117)
BUN/Creatinine Ratio: 4 — ABNORMAL LOW (ref 12–28)
BUN: 7 mg/dL — ABNORMAL LOW (ref 8–27)
Bilirubin Total: 0.5 mg/dL (ref 0.0–1.2)
CO2: 25 mmol/L (ref 20–29)
Calcium: 10.6 mg/dL — ABNORMAL HIGH (ref 8.7–10.3)
Chloride: 98 mmol/L (ref 96–106)
Creatinine, Ser: 1.72 mg/dL — ABNORMAL HIGH (ref 0.57–1.00)
GFR calc Af Amer: 33 mL/min/{1.73_m2} — ABNORMAL LOW (ref >59)
GFR calc non Af Amer: 28 mL/min/{1.73_m2} — ABNORMAL LOW (ref >59)
Globulin, Total: 3.2 g/dL (ref 1.5–4.5)
Glucose: 123 mg/dL — ABNORMAL HIGH (ref 65–99)
Potassium: 4.6 mmol/L (ref 3.5–5.2)
Sodium: 140 mmol/L (ref 134–144)
Total Protein: 7.4 g/dL (ref 6.0–8.5)

## 2018-06-13 LAB — CLOSTRIDIUM DIFFICILE BY PCR: Toxigenic C. Difficile by PCR: NEGATIVE

## 2018-06-15 NOTE — Progress Notes (Signed)
Remote pacemaker transmission.   

## 2018-06-17 ENCOUNTER — Other Ambulatory Visit: Payer: Self-pay | Admitting: Family

## 2018-06-18 ENCOUNTER — Ambulatory Visit: Payer: Medicare Other | Admitting: Family

## 2018-06-25 ENCOUNTER — Other Ambulatory Visit: Payer: Self-pay | Admitting: Family

## 2018-07-01 ENCOUNTER — Ambulatory Visit (INDEPENDENT_AMBULATORY_CARE_PROVIDER_SITE_OTHER): Payer: Medicare Other | Admitting: Family Medicine

## 2018-07-01 ENCOUNTER — Other Ambulatory Visit: Payer: Self-pay

## 2018-07-01 ENCOUNTER — Encounter: Payer: Self-pay | Admitting: Family Medicine

## 2018-07-01 VITALS — BP 149/73 | HR 102 | Temp 97.0°F

## 2018-07-01 DIAGNOSIS — N3000 Acute cystitis without hematuria: Secondary | ICD-10-CM | POA: Diagnosis not present

## 2018-07-01 DIAGNOSIS — N390 Urinary tract infection, site not specified: Secondary | ICD-10-CM

## 2018-07-01 LAB — URINALYSIS, COMPLETE
Bilirubin, UA: NEGATIVE
Glucose, UA: NEGATIVE
Ketones, UA: NEGATIVE
Nitrite, UA: POSITIVE — AB
Protein, UA: NEGATIVE
Specific Gravity, UA: 1.01 (ref 1.005–1.030)
Urobilinogen, Ur: 0.2 mg/dL (ref 0.2–1.0)
pH, UA: 6 (ref 5.0–7.5)

## 2018-07-01 LAB — MICROSCOPIC EXAMINATION: WBC, UA: >30 /hpf — AB (ref 0–5)

## 2018-07-01 MED ORDER — NITROFURANTOIN MONOHYD MACRO 100 MG PO CAPS: 100.0000 mg | ORAL_CAPSULE | Freq: Two times a day (BID) | ORAL | 0 refills | Status: DC

## 2018-07-01 NOTE — Progress Notes (Signed)
BP (!) 149/73   Pulse (!) 102   Temp (!) 97 F (36.1 C) (Oral)    Subjective:    Patient ID: Lorraine Leonard, female    DOB: 05-Mar-1941, 78 y.o.   MRN: [id number]  HPI: Lorraine Leonard is a 78 y.o. female presenting on 07/01/2018 for Dysuria (x 1 week )   HPI Dysuria Patient is coming in complaining of dysuria and frequency and burning with urination and right-sided flank pain that is been going on past 1 week.  She feels like a strong worsening.  She did try some Azo which did help but then it came back worse this morning.  She denies any fevers or chills or blood in her urine.  She denies any vaginal issues.  She denies any nausea or vomiting or constipation  Relevant past medical, surgical, family and social history reviewed and updated as indicated. Interim medical history since our last visit reviewed. Allergies and medications reviewed and updated.  Review of Systems  Constitutional: Negative for chills and fever.  Eyes: Negative for visual disturbance.  Respiratory: Negative for chest tightness and shortness of breath.   Cardiovascular: Negative for chest pain and leg swelling.  Gastrointestinal: Negative for abdominal pain.  Genitourinary: Positive for dysuria, frequency and urgency. Negative for hematuria, vaginal bleeding, vaginal discharge and vaginal pain.  Musculoskeletal: Negative for back pain and gait problem.  Skin: Negative for rash.  Neurological: Negative for light-headedness and headaches.  Psychiatric/Behavioral: Negative for agitation and behavioral problems.  All other systems reviewed and are negative.   Per HPI unless specifically indicated above        Objective:    BP (!) 149/73   Pulse (!) 102   Temp (!) 97 F (36.1 C) (Oral)   Wt Readings from Last 3 Encounters:  06/11/18 259 lb 9.6 oz (117.8 kg)  06/04/18 276 lb (125.2 kg)  05/11/18 272 lb 9.6 oz (123.7 kg)    Physical Exam Vitals signs and nursing note reviewed.  Constitutional:    General: She is not in acute distress.    Appearance: She is well-developed. She is not diaphoretic.     Comments: Patient is on 2 L nasal cannula oxygen  Eyes:     Conjunctiva/sclera: Conjunctivae normal.  Cardiovascular:     Rate and Rhythm: Normal rate and regular rhythm.     Heart sounds: Normal heart sounds. No murmur.  Pulmonary:     Effort: Pulmonary effort is normal. No respiratory distress.     Breath sounds: Normal breath sounds. No wheezing.  Abdominal:     General: Bowel sounds are normal. There is no distension.     Palpations: Abdomen is soft. Abdomen is not rigid. There is no mass.     Tenderness: There is abdominal tenderness in the suprapubic area. There is no guarding or rebound.  Skin:    General: Skin is warm and dry.     Findings: No rash.  Neurological:     Mental Status: She is alert and oriented to person, place, and time.     Coordination: Coordination normal.  Psychiatric:        Behavior: Behavior normal.     Urinalysis: Greater than 30 WBCs, 3-10 RBCs, 0-10 epithelial cells, moderate bacteria, nitrite positive    Assessment & Plan:   Problem List Items Addressed This Visit    None    Visit Diagnoses    Acute cystitis without hematuria    -  Primary  Relevant Medications   nitrofurantoin, macrocrystal-monohydrate, (MACROBID) 100 MG capsule   Other Relevant Orders   Urinalysis, Complete   Urine Culture   Ambulatory referral to Urology   Recurrent UTI       Relevant Medications   nitrofurantoin, macrocrystal-monohydrate, (MACROBID) 100 MG capsule   Other Relevant Orders   Ambulatory referral to Urology       Follow up plan: Return if symptoms worsen or fail to improve.  Counseling provided for all of the vaccine components Orders Placed This Encounter  Procedures  . Urinalysis, Complete    Caryl Pina, MD [facility] 07/01/2018, 2:30 PM

## 2018-07-02 ENCOUNTER — Telehealth: Payer: Self-pay | Admitting: Family

## 2018-07-02 NOTE — Telephone Encounter (Signed)
Called pt - she already spoke with [facility] and got this issue addressed.

## 2018-07-02 NOTE — Telephone Encounter (Signed)
Pt was given Macrobid yesterday and she is confused on how she is suppose to be taking it because of another medication?

## 2018-07-05 LAB — URINE CULTURE

## 2018-07-09 ENCOUNTER — Other Ambulatory Visit: Payer: Self-pay | Admitting: Family Medicine

## 2018-07-09 DIAGNOSIS — F419 Anxiety disorder, unspecified: Secondary | ICD-10-CM

## 2018-07-12 ENCOUNTER — Telehealth: Payer: Self-pay | Admitting: Family Medicine

## 2018-07-14 DIAGNOSIS — N3020 Other chronic cystitis without hematuria: Secondary | ICD-10-CM | POA: Diagnosis not present

## 2018-07-14 DIAGNOSIS — R351 Nocturia: Secondary | ICD-10-CM | POA: Diagnosis not present

## 2018-07-15 ENCOUNTER — Ambulatory Visit (INDEPENDENT_AMBULATORY_CARE_PROVIDER_SITE_OTHER): Payer: Medicare Other | Admitting: Family

## 2018-07-15 ENCOUNTER — Encounter: Payer: Self-pay | Admitting: Family

## 2018-07-15 ENCOUNTER — Ambulatory Visit: Payer: Medicare Other | Admitting: Internal Medicine

## 2018-07-15 ENCOUNTER — Other Ambulatory Visit: Payer: Self-pay

## 2018-07-15 DIAGNOSIS — I5031 Acute diastolic (congestive) heart failure: Secondary | ICD-10-CM | POA: Diagnosis not present

## 2018-07-15 MED ORDER — FUROSEMIDE 40 MG PO TABS: 60.0000 mg | ORAL_TABLET | ORAL | 3 refills | Status: DC

## 2018-07-15 NOTE — Progress Notes (Signed)
   Virtual Visit via telephone Note  I connected with Lorraine Leonard on 07/15/18 at 12:29 pm by telephone and verified that I am speaking with the correct person using two identifiers. Lorraine Leonard is currently located at home and husband is currently with her during visit. The provider, Evelina Dun, FNP is located in their office at time of visit.  I discussed the limitations, risks, security and privacy concerns of performing an evaluation and management service by telephone and the availability of in person appointments. I also discussed with the patient that there may be a patient responsible charge related to this service. The patient expressed understanding and agreed to proceed.   History and Present Illness:  Pt complaining of bilateral leg swelling and pain. This is recurrent but she noticed this swelling about 2-3 weeks ago. She has CHF and does not weigh herself daily, because she does not have a scale. She states she has been taking Lasix 40 mg one day then 60 mg the next.   She has COPD & Pulmonary Fibrosis and always has SOB with exertion. No changes in this over the last two weeks.   She states she has been keep her feet elevated with mild relief.    States she has not been wearing her compression hose.  Congestive Heart Failure  Presents for follow-up visit. Associated symptoms include edema, fatigue, nocturia and shortness of breath. Pertinent negatives include no chest pressure or claudication. Unexpected weight change: pt does weigh herself daily.      Review of Systems  Constitutional: Positive for fatigue. Unexpected weight change: pt does weigh herself daily.  Respiratory: Positive for shortness of breath.   Cardiovascular: Negative for claudication.  Genitourinary: Positive for nocturia.        Assessment and Plan: 1. Acute diastolic CHF (congestive heart failure) (HCC) Will increase lasix to 60 mg from alternating 40 mg one day to 60 mg the next Start  wearing compress hose Low salt diet Elevate feet when possible Will do referral to CHF clinic, as patient does not have scale at home she can use or read Keep all follow up with Cardiologists RTO in 1-2 weeks to office to have lab work drawn - furosemide (LASIX) 40 MG tablet; Take 1.5 tablets (60 mg total) by mouth See admin instructions for 30 days.  Dispense: 135 tablet; Refill: 3 - AMB referral to CHF clinic     I discussed the assessment and treatment plan with the patient. The patient was provided an opportunity to ask questions and all were answered. The patient agreed with the plan and demonstrated an understanding of the instructions.   The patient was advised to call back or seek an in-person evaluation if the symptoms worsen or if the condition fails to improve as anticipated.  The above assessment and management plan was discussed with the patient. The patient verbalized understanding of and has agreed to the management plan. Patient is aware to call the clinic if symptoms persist or worsen. Patient is aware when to return to the clinic for a follow-up visit. Patient educated on when it is appropriate to go to the emergency department.    Call ended 12:43 pm, I provided 14 minutes of non-face-to-face time during this encounter.    Evelina Dun, FNP

## 2018-07-19 ENCOUNTER — Other Ambulatory Visit: Payer: Self-pay | Admitting: Family

## 2018-07-19 ENCOUNTER — Encounter: Payer: Self-pay | Admitting: Family Medicine

## 2018-07-19 ENCOUNTER — Emergency Department (HOSPITAL_COMMUNITY): Payer: Medicare Other

## 2018-07-19 ENCOUNTER — Encounter (HOSPITAL_COMMUNITY): Payer: Self-pay | Admitting: Emergency Medicine

## 2018-07-19 ENCOUNTER — Other Ambulatory Visit: Payer: Self-pay

## 2018-07-19 ENCOUNTER — Emergency Department (HOSPITAL_COMMUNITY)
Admission: EM | Admit: 2018-07-19 | Discharge: 2018-07-19 | Disposition: A | Discharge status: Home / Self Care | Payer: Medicare Other | Attending: Emergency Medicine | Admitting: Emergency Medicine

## 2018-07-19 ENCOUNTER — Ambulatory Visit (INDEPENDENT_AMBULATORY_CARE_PROVIDER_SITE_OTHER): Payer: Medicare Other | Admitting: Family Medicine

## 2018-07-19 DIAGNOSIS — N182 Chronic kidney disease, stage 2 (mild): Secondary | ICD-10-CM | POA: Insufficient documentation

## 2018-07-19 DIAGNOSIS — E039 Hypothyroidism, unspecified: Secondary | ICD-10-CM | POA: Diagnosis not present

## 2018-07-19 DIAGNOSIS — I130 Hypertensive heart and chronic kidney disease with heart failure and stage 1 through stage 4 chronic kidney disease, or unspecified chronic kidney disease: Secondary | ICD-10-CM | POA: Insufficient documentation

## 2018-07-19 DIAGNOSIS — E1122 Type 2 diabetes mellitus with diabetic chronic kidney disease: Secondary | ICD-10-CM | POA: Insufficient documentation

## 2018-07-19 DIAGNOSIS — J8410 Pulmonary fibrosis, unspecified: Secondary | ICD-10-CM

## 2018-07-19 DIAGNOSIS — I5031 Acute diastolic (congestive) heart failure: Secondary | ICD-10-CM | POA: Diagnosis not present

## 2018-07-19 DIAGNOSIS — J449 Chronic obstructive pulmonary disease, unspecified: Secondary | ICD-10-CM | POA: Diagnosis not present

## 2018-07-19 DIAGNOSIS — Z950 Presence of cardiac pacemaker: Secondary | ICD-10-CM | POA: Insufficient documentation

## 2018-07-19 DIAGNOSIS — Z7901 Long term (current) use of anticoagulants: Secondary | ICD-10-CM | POA: Diagnosis not present

## 2018-07-19 DIAGNOSIS — Z79899 Other long term (current) drug therapy: Secondary | ICD-10-CM | POA: Diagnosis not present

## 2018-07-19 DIAGNOSIS — Z7984 Long term (current) use of oral hypoglycemic drugs: Secondary | ICD-10-CM | POA: Insufficient documentation

## 2018-07-19 DIAGNOSIS — R509 Fever, unspecified: Secondary | ICD-10-CM

## 2018-07-19 LAB — CBC WITH DIFFERENTIAL/PLATELET
Abs Immature Granulocytes: 0.02 10*3/uL (ref 0.00–0.07)
Basophils Absolute: 0.1 10*3/uL (ref 0.0–0.1)
Basophils Relative: 1 %
Eosinophils Absolute: 0.1 10*3/uL (ref 0.0–0.5)
Eosinophils Relative: 1 %
HCT: 39.5 % (ref 36.0–46.0)
Hemoglobin: 12.6 g/dL (ref 12.0–15.0)
Immature Granulocytes: 0 %
Lymphocytes Relative: 13 %
Lymphs Abs: 1.1 10*3/uL (ref 0.7–4.0)
MCH: 30.1 pg (ref 26.0–34.0)
MCHC: 31.9 g/dL (ref 30.0–36.0)
MCV: 94.3 fL (ref 80.0–100.0)
Monocytes Absolute: 0.9 10*3/uL (ref 0.1–1.0)
Monocytes Relative: 10 %
Neutro Abs: 6.3 10*3/uL (ref 1.7–7.7)
Neutrophils Relative %: 75 %
Platelets: 251 10*3/uL (ref 150–400)
RBC: 4.19 MIL/uL (ref 3.87–5.11)
RDW: 14.3 % (ref 11.5–15.5)
WBC: 8.4 10*3/uL (ref 4.0–10.5)
nRBC: 0 % (ref 0.0–0.2)

## 2018-07-19 LAB — CK: Total CK: 23 U/L — ABNORMAL LOW (ref 38–234)

## 2018-07-19 LAB — COMPREHENSIVE METABOLIC PANEL
ALT: 17 U/L (ref 0–44)
AST: 44 U/L — ABNORMAL HIGH (ref 15–41)
Albumin: 3.4 g/dL — ABNORMAL LOW (ref 3.5–5.0)
Alkaline Phosphatase: 70 U/L (ref 38–126)
Anion gap: 12 (ref 5–15)
BUN: 11 mg/dL (ref 8–23)
CO2: 27 mmol/L (ref 22–32)
Calcium: 9.8 mg/dL (ref 8.9–10.3)
Chloride: 98 mmol/L (ref 98–111)
Creatinine, Ser: 1.4 mg/dL — ABNORMAL HIGH (ref 0.44–1.00)
GFR calc Af Amer: 42 mL/min — ABNORMAL LOW (ref >60)
GFR calc non Af Amer: 36 mL/min — ABNORMAL LOW (ref >60)
Glucose, Bld: 140 mg/dL — ABNORMAL HIGH (ref 70–99)
Potassium: 3.8 mmol/L (ref 3.5–5.1)
Sodium: 137 mmol/L (ref 135–145)
Total Bilirubin: 0.7 mg/dL (ref 0.3–1.2)
Total Protein: 7.1 g/dL (ref 6.5–8.1)

## 2018-07-19 LAB — URINALYSIS, ROUTINE W REFLEX MICROSCOPIC
Bilirubin Urine: NEGATIVE
Glucose, UA: NEGATIVE mg/dL
Hgb urine dipstick: NEGATIVE
Ketones, ur: NEGATIVE mg/dL
Leukocytes,Ua: NEGATIVE
Nitrite: NEGATIVE
Protein, ur: NEGATIVE mg/dL
Specific Gravity, Urine: <1.005 — ABNORMAL LOW (ref 1.005–1.030)
pH: 6 (ref 5.0–8.0)

## 2018-07-19 LAB — C-REACTIVE PROTEIN: CRP: 2.4 mg/dL — ABNORMAL HIGH (ref <1.0)

## 2018-07-19 LAB — LACTIC ACID, PLASMA
Lactic Acid, Venous: 3.3 mmol/L (ref 0.5–1.9)
Lactic Acid, Venous: 2.3 mmol/L (ref 0.5–1.9)

## 2018-07-19 LAB — FERRITIN: Ferritin: 29 ng/mL (ref 11–307)

## 2018-07-19 MED ORDER — SODIUM CHLORIDE 0.9 % IV BOLUS
500.0000 mL | Freq: Once | INTRAVENOUS | Status: AC
  Administered 2018-07-19: 500 mL via INTRAVENOUS

## 2018-07-19 MED ORDER — ACETAMINOPHEN 325 MG PO TABS
650.0000 mg | ORAL_TABLET | Freq: Once | ORAL | Status: AC
  Administered 2018-07-19: 650 mg via ORAL
  Filled 2018-07-19: qty 2

## 2018-07-19 NOTE — ED Triage Notes (Signed)
Patient states she has had some diarrhea since last night.

## 2018-07-19 NOTE — ED Notes (Signed)
Pt sleeping at this time.

## 2018-07-19 NOTE — ED Notes (Signed)
Date and time results received: 07/19/18 3:46 PM  (use smartphrase ".now" to insert current time)  Test: Lactic acid Critical Value: 2.3  Name of Provider Notified: Eulis Foster  Orders Received? Or Actions Taken?: Orders Received - See Orders for details

## 2018-07-19 NOTE — Progress Notes (Signed)
Subjective:  Patient ID: Lorraine Leonard, female    DOB: [birth year]  Age: 78 y.o. MRN: [id number]  CC: No chief complaint on file.   HPI ROSANGELA Leonard presents for feeling weak. Headache. Stomach bothers her a lot. Wearing oxygen. Gets dyspneic to go to the shower and any activity without the oxygen. Usually this doesn't make her dyspneic. Additionally she states she is short of breath just talking to me on the phone. Cough is chronic, but increased from usual. Having swelling. Had phone visit with Lorraine Leonard 4 days ago. At that time Dx was CHF. Pt. Instructed to increase the dose of Lasix. Increase of Lasix not helping with edema.. Fever to 100.7.  Depression screen [facility] 2/9 07/01/2018 06/11/2018 05/25/2018  Decreased Interest 2 2 0  Down, Depressed, Hopeless 2 3 0  PHQ - 2 Score 4 5 0  Altered sleeping 3 3 -  Tired, decreased energy 3 1 -  Change in appetite 1 1 -  Feeling bad or failure about yourself  0 0 -  Trouble concentrating 2 0 -  Moving slowly or fidgety/restless 1 0 -  Suicidal thoughts 0 0 -  PHQ-9 Score 14 10 -  Difficult doing work/chores - - -  Some recent data might be hidden    History Lorraine Leonard has a past medical history of Acute diastolic CHF (congestive heart failure) ([place]) (11/11/2016), Allergic rhinitis, Anxiety, Anxiety, Arthritis, Atrial fibrillation ([place]) (09/2016), Atrial fibrillation with RVR ([place]) (05/18/2017), Chronic lower back pain, COPD (chronic obstructive pulmonary disease) ([place]), Depression, Diverticulosis, Dyspnea, Dysrhythmia, GERD (gastroesophageal reflux disease), Gout, History of blood transfusion (1983), Hyperlipidemia, Hypertension, Hypothyroidism, Macular degeneration of both eyes, Neuropathy, On home oxygen therapy, Pre-diabetes, 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   She has a past surgical history that includes Tubal ligation; EUS  (N/A, 10/03/2015); Fine needle aspiration (N/A, 10/03/2015); Cardioversion (N/A, 01/30/2017); Cardioversion (N/A, 04/09/2017); Insert / replace / remove pacemaker (05/18/2017); Back surgery; AV node ablation (05/18/2017); Nephrectomy living donor [phone]); Lumbar laminectomy (1995; 06/23/2008); Breast lumpectomy (Left); Total abdominal hysterectomy; Cataract extraction w/ intraocular lens  implant, bilateral (Bilateral); Dilation and curettage of uterus; AV NODE ABLATION (N/A, 05/18/2017); and PACEMAKER IMPLANT (N/A, 05/18/2017).   Her family history includes Anxiety disorder in an other family member; Asthma in an other family member; CVA in her son; Cancer in her sister, sister, sister, and sister; Coronary artery disease in an other family member; Diabetes in an other family member; Healthy in her brother and sister; Heart failure in her father; Hyperlipidemia in an other family member; Hypertension in an other family member; Kidney disease in her sister; Lung cancer in her sister and sister; Migraines in an other family member; Stroke in an other family member; Suicidality in her son.She reports that she has never smoked. She has never used smokeless tobacco. She reports that she does not drink alcohol or use drugs.    ROS Review of Systems  Constitutional: Positive for activity change (decreased), fatigue and fever.  HENT: Negative for congestion.   Eyes: Positive for visual disturbance. Negative for photophobia.  Respiratory: Positive for cough (Significant increase from baseline.) and shortness of breath. Negative for wheezing.   Cardiovascular: Positive for leg swelling. Negative for chest pain.  Gastrointestinal: Negative for abdominal pain, constipation, diarrhea, nausea and vomiting.  Genitourinary: Negative for difficulty urinating.  Musculoskeletal: Negative for arthralgias and myalgias.  Neurological: Positive for headaches.  Objective:  There were no vitals taken for this visit.  BP  Readings from Last 3 Encounters:  07/01/18 (!) 149/73  06/11/18 (!) 124/55  06/05/18 116/74    Wt Readings from Last 3 Encounters:  06/11/18 259 lb 9.6 oz (117.8 kg)  06/04/18 276 lb (125.2 kg)  05/11/18 272 lb 9.6 oz (123.7 kg)     Physical Exam    Assessment & Plan:   Diagnoses and all orders for this visit:  Chronic obstructive pulmonary disease, unspecified COPD type ([place])  Pulmonary fibrosis ([place])  Acute diastolic CHF (congestive heart failure) ([place])       I am having Lorraine Leonard maintain her calcium citrate, vitamin E, cyanocobalamin, vitamin C, EPINEPHrine, docusate sodium, polyethylene glycol powder, albuterol, traMADol, famotidine, azelastine, Incruse Ellipta, metFORMIN, Vitamin D (Ergocalciferol), digoxin, diltiazem, levothyroxine, spironolactone, levocetirizine, rosuvastatin, trimethoprim, fluticasone, esomeprazole, OXYGEN, Olopatadine HCl, Polyethyl Glycol-Propyl Glycol, acetaminophen, PreserVision AREDS 2, potassium chloride, ESTRACE VAGINAL, apixaban, ondansetron, nitrofurantoin (macrocrystal-monohydrate), diazepam, and furosemide.  Allergies as of 07/19/2018      Reactions   Allopurinol Swelling   Feet and legs swell   Mobic [meloxicam] Swelling, Other (See Comments)   Feet and Leg swelling      Medication List       Accurate as of July 19, 2018 10:38 AM. Always use your most recent med list.        acetaminophen 500 MG tablet Commonly known as:  TYLENOL Take 500 mg by mouth every 6 (six) hours as needed (pain).   albuterol (2.5 MG/3ML) 0.083% nebulizer solution Commonly known as:  PROVENTIL Take 3 mLs (2.5 mg total) by nebulization every 6 (six) hours as needed for wheezing or shortness of breath. J96.01   apixaban 5 MG Tabs tablet Commonly known as:  Eliquis Take 1 tablet (5 mg total) by mouth 2 (two) times daily.   azelastine 0.1 % nasal spray Commonly known as:  ASTELIN PLACE 2 SPRAYS INTO BOTH NOSTRILS 2 (TWO) TIMES DAILY AS  NEEDED.   calcium citrate 950 MG tablet Commonly known as:  CALCITRATE - dosed in mg elemental calcium Take 1 tablet by mouth 2 (two) times daily.   cyanocobalamin 1000 MCG tablet Take 1,000 mcg by mouth daily. Vitamin b12   diazepam 5 MG tablet Commonly known as:  VALIUM TAKE 1 TABLET BY MOUTH TWICE A DAY AS NEEDED FOR ANXIETY   digoxin 0.125 MG tablet Commonly known as:  LANOXIN TAKE 1 TABLET BY MOUTH DAILY   diltiazem 180 MG 24 hr capsule Commonly known as:  CARDIZEM CD TAKE 1 CAPSULE BY MOUTH TWICE A DAY   docusate sodium 100 MG capsule Commonly known as:  COLACE Take 200 mg by mouth daily as needed for mild constipation.   EPINEPHrine 0.3 mg/0.3 mL Soaj injection Commonly known as:  EPI-PEN Inject 0.3 mg into the muscle once as needed (For anaphylaxis.).   esomeprazole 40 MG capsule Commonly known as:  NEXIUM Take 40 mg by mouth daily.   ESTRACE VAGINAL 0.1 MG/GM vaginal cream Generic drug:  estradiol PLACE 8.41 APPLICATORFUL VAGINALLY WEEKLY AS NEEDED AS DIRECTED   famotidine 20 MG tablet Commonly known as:  PEPCID Take 1 tablet (20 mg total) by mouth 2 (two) times daily.   fluticasone 50 MCG/ACT nasal spray Commonly known as:  FLONASE PLACE 1 SPRAY INTO BOTH NOSTRILS 2 (TWO) TIMES DAILY AS NEEDED FOR ALLERGIES OR RHINITIS.   furosemide 40 MG tablet Commonly known as:  Lasix Take 1.5 tablets (60 mg total) by  mouth See admin instructions for 30 days.   Incruse Ellipta 62.5 MCG/INH Aepb Generic drug:  umeclidinium bromide INHALE 1 PUFF BY MOUTH EVERY DAY   levocetirizine 5 MG tablet Commonly known as:  XYZAL TAKE 1 TABLET BY MOUTH EVERY DAY IN THE EVENING   levothyroxine 112 MCG tablet Commonly known as:  Synthroid Take 1 tablet (112 mcg total) by mouth daily.   metFORMIN 500 MG 24 hr tablet Commonly known as:  Glucophage XR Take 1 tablet (500 mg total) by mouth daily with breakfast.   nitrofurantoin (macrocrystal-monohydrate) 100 MG capsule  Commonly known as:  Macrobid Take 1 capsule (100 mg total) by mouth 2 (two) times daily. 1 po BId   ondansetron 8 MG tablet Commonly known as:  ZOFRAN Take 1 tablet (8 mg total) by mouth every 8 (eight) hours as needed for nausea or vomiting.   OXYGEN Inhale 3 L into the lungs continuous.   Pataday 0.2 % Soln Generic drug:  Olopatadine HCl Place 1 drop into both eyes daily.   polyethylene glycol powder powder Commonly known as:  GLYCOLAX/MIRALAX TAKE 17 G BY MOUTH 2 (TWO) TIMES DAILY AS NEEDED.   potassium chloride 10 MEQ tablet Commonly known as:  K-DUR Take 1 tablet (10 mEq total) by mouth daily.   PreserVision AREDS 2 Caps Take 1 capsule by mouth 2 (two) times daily.   rosuvastatin 10 MG tablet Commonly known as:  CRESTOR TAKE 1 TABLET BY MOUTH EVERYDAY AT BEDTIME   spironolactone 25 MG tablet Commonly known as:  ALDACTONE TAKE 1 TABLET BY MOUTH EVERY DAY   Systane 0.4-0.3 % Gel ophthalmic gel Generic drug:  Polyethyl Glycol-Propyl Glycol Place 1 application into both eyes at bedtime as needed (dry eyes).   traMADol 50 MG tablet Commonly known as:  ULTRAM Take 1 tablet (50 mg total) by mouth every 6 (six) hours as needed.   trimethoprim 100 MG tablet Commonly known as:  TRIMPEX TAKE 1 TABLET BY MOUTH EVERY DAY   vitamin C 500 MG tablet Commonly known as:  ASCORBIC ACID Take 1,000 mg by mouth daily.   Vitamin D (Ergocalciferol) 1.25 MG (50000 UT) Caps capsule Commonly known as:  DRISDOL TAKE 1 CAPSULE (50,000 UNITS TOTAL) BY MOUTH EVERY 7 (SEVEN) DAYS.   vitamin E 400 UNIT capsule Take 400 Units by mouth daily.        Follow-up: Pt. To go [facility] at [facility] now.  This is due to fever, dyspnea and cough significantly worse than baseline. Needs Screening for COVID 19, check for exacerbation of COPD with existing pulmonary fibrosis. Virtual Visit via telephone Note  I discussed the limitations, risks, security and privacy concerns of performing an  evaluation and management service by telephone and the availability of in person appointments. I also discussed with the patient that there may be a patient responsible charge related to this service. The patient expressed understanding and agreed to proceed.  Follow Up Instructions:   I discussed the assessment and treatment plan with the patient. The patient was provided an opportunity to ask questions and all were answered. The patient agreed with the plan and demonstrated an understanding of the instructions.   The patient was advised to call back or seek an in-person evaluation if the symptoms worsen or if the condition fails to improve as anticipated.  Call started: 10:22 Call ended:  10:38 Call minutes: 16  Claretta Fraise, M.D.

## 2018-07-19 NOTE — Discharge Instructions (Addendum)
At this time, there is no sign of serious injury.  She appears to have a viral illness and might have Covid-19.  There is no specific treatment for this.  Things to watch out for include uncontrolled fever, shortness of breath, confusion, and inability to eat or drink, or any concerns for worsening condition.  You can always try calling your PCP prior to return to the ED, but can always return here as needed for anything.  Continue giving her her usual medicines.  Give her Tylenol every 4 hours, 650 mg, for several days then as needed for fever.  Encourage her to drink frequently.  Try to make sure she eats 3 meals each day.

## 2018-07-19 NOTE — ED Notes (Signed)
Spoke at length with pt's daughter regarding home quarantine and return precautions.  States she will discuss and reiterate this with pt's husband.  All questions answered at this time.  Will arrange transportation home.

## 2018-07-19 NOTE — ED Notes (Signed)
Date and time results received: 07/19/18 2:27 PM  (use smartphrase ".now" to insert current time)  Test: Lactic Critical Value: 3.3  Name of Provider Notified: Eulis Foster  Orders Received? Or Actions Taken?: Orders Received - See Orders for details

## 2018-07-19 NOTE — ED Notes (Signed)
Pt eating crackers and drinking water.  States she feels better, just tired.

## 2018-07-19 NOTE — ED Provider Notes (Signed)
[facility] EMERGENCY DEPARTMENT Provider Note   CSN: [id number] Arrival date & time: 07/19/18  1139    History   Chief Complaint Chief Complaint  Patient presents with  . Fever    HPI Lorraine Leonard is a 78 y.o. female.     HPI She presents for evaluation of fever.  She is a somewhat poor historian and cannot give exact details.  Last night she had some loose bowel movements.  She denies nausea, or vomiting.  She states that she has been coughing and producing sputum that is white in color for at least 1 week possibly as much as 3 months.  She does not smoke cigarettes.  She lives with her husband.  She denies travel, recently, known sick exposures, or other recent respiratory illness.  She saw her urologist last week and was put on a medicine to "prevent urinary tract infections that I take every day."  There are no other known modifying factors.     Past Medical History:  Diagnosis Date  . Acute diastolic CHF (congestive heart failure) ([place]) 11/11/2016  . Allergic rhinitis   . Anxiety   . Anxiety   . Arthritis    "~ all my joints" (05/18/2017)  . Atrial fibrillation ([place]) 09/2016  . Atrial fibrillation with RVR ([place]) 05/18/2017  . Chronic lower back pain   . COPD (chronic obstructive pulmonary disease) ([place])   . Depression   . Diverticulosis   . Dyspnea   . Dysrhythmia   . GERD (gastroesophageal reflux disease)   . Gout   . 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 Hyperlipidemia   . Hypertension   . Hypothyroidism   . Macular degeneration of both eyes   . Neuropathy   . On home oxygen therapy    "3L; 24/7" (05/18/2017)  . Pre-diabetes   . 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 Pulmonary fibrosis ([place])   . Pulmonary fibrosis ([place])   . Rheumatoid arthritis ([place])   . Sleep apnea    uses 3 liters of o2 24/7  . Urticaria     Patient Active Problem List   Diagnosis Date Noted  . Chronic respiratory failure with  hypoxia ([place]) 03/26/2017  . COPD (chronic obstructive pulmonary disease) ([place]) 03/26/2017  . Atrial fibrillation ([place]) 02/09/2017  . Paroxysmal atrial fibrillation ([place]) 12/17/2016  . Black stool 12/17/2016  . Acute diastolic CHF (congestive heart failure) ([place]) 11/11/2016  . CKD (chronic kidney disease) stage 2, GFR 60-89 ml/min 11/11/2016  . Pulmonary fibrosis ([place]) 09/23/2016  . Diabetes mellitus ([place]) 07/17/2016  . Morbid obesity ([place]) 04/18/2016  . Snoring 11/09/2015  . Pulmonary hypertension ([place]) 11/09/2015  . Gout 10/19/2015  . Chronic urticaria 03/24/2015  . Metabolic syndrome 30/12/2328  . Single kidney 03/01/2015  . Macular degeneration of both eyes 11/28/2014  . Constipation 11/28/2014  . Chronic rhinitis 11/17/2014  . Hypertensive retinopathy 10/30/2014  . Vitamin D deficiency 08/17/2014  . Arthritis of knee 08/17/2014  . Hyperlipidemia associated with type 2 diabetes mellitus ([place]) 08/17/2014  . Depression   . Hypothyroidism   . Anxiety   . Cardiomegaly 05/05/2011  . Hypertension associated with diabetes ([place]) 02/08/2007  . 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 GASTROESOPHAGEAL REFLUX DISEASE 02/08/2007    Past Surgical History:  Procedure Laterality Date  . AV NODE ABLATION  05/18/2017  . AV NODE ABLATION N/A 05/18/2017   Procedure: AV NODE ABLATION;  Surgeon: Cristopher Peru  W, MD;  Location: [place];  Service: Cardiovascular;  Laterality: N/A;  . BACK SURGERY    . BREAST LUMPECTOMY Left   . CARDIOVERSION N/A 01/30/2017   Procedure: CARDIOVERSION;  Surgeon: Josue Hector, MD;  Location: [facility];  Service: Cardiovascular;  Laterality: N/A;  . CARDIOVERSION N/A 04/09/2017   Procedure: CARDIOVERSION;  Surgeon: Satira Sark, MD;  Location: [facility];  Service: Cardiovascular;  Laterality: N/A;  . CATARACT EXTRACTION W/ INTRAOCULAR LENS  IMPLANT, BILATERAL Bilateral   . DILATION AND CURETTAGE OF UTERUS    . EUS N/A 10/03/2015   Procedure:  UPPER ENDOSCOPIC ULTRASOUND (EUS) RADIAL;  Surgeon: Arta Silence, MD;  Location: [facility] ENDOS[facility];  Service: Endoscopy;  Laterality: N/A;  . FINE NEEDLE ASPIRATION N/A 10/03/2015   Procedure: FINE NEEDLE ASPIRATION (FNA) RADIAL;  Surgeon: Arta Silence, MD;  Location: [facility];  Service: Endoscopy;  Laterality: N/A;  . INSERT / REPLACE / REMOVE PACEMAKER  05/18/2017  . LUMBAR LAMINECTOMY  1995; 06/23/2008   L4-5/notes 08/20/2010  . [place]   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  . PACEMAKER IMPLANT N/A 05/18/2017   Procedure: PACEMAKER IMPLANT;  Surgeon: Evans Lance, MD;  Location: [facility];  Service: Cardiovascular;  Laterality: N/A;  . TOTAL ABDOMINAL HYSTERECTOMY    . TUBAL LIGATION       OB History    Gravida  4   Para  4   Term  4   Preterm      AB      Living        SAB      TAB      Ectopic      Multiple      Live Births               Home Medications    Prior to Admission medications   Medication Sig Start Date End Date Taking? Authorizing Provider  acetaminophen (TYLENOL) 500 MG tablet Take 500 mg by mouth every 6 (six) hours as needed (pain).    [provider]  albuterol (PROVENTIL) (2.5 MG/3ML) 0.083% nebulizer solution Take 3 mLs (2.5 mg total) by nebulization every 6 (six) hours as needed for wheezing or shortness of breath. J96.01 02/20/17   Sharion Balloon, FNP  apixaban (ELIQUIS) 5 MG TABS tablet Take 1 tablet (5 mg total) by mouth 2 (two) times daily. 06/28/18   Evelina Dun A, FNP  azelastine (ASTELIN) 0.1 % nasal spray PLACE 2 SPRAYS INTO BOTH NOSTRILS 2 (TWO) TIMES DAILY AS NEEDED. Patient taking differently: Place 2 sprays into both nostrils at bedtime.  10/01/17   Sharion Balloon, FNP  calcium citrate (CALCITRATE - DOSED IN MG ELEMENTAL CALCIUM) 950 MG tablet Take 1 tablet by mouth 2 (two) times daily.     [provider]  cyanocobalamin 1000 MCG tablet Take 1,000 mcg by mouth daily. Vitamin b12     [provider]  diazepam (VALIUM) 5 MG tablet TAKE 1 TABLET BY MOUTH TWICE A DAY AS NEEDED FOR ANXIETY 07/09/18   Hawks, Christy A, FNP  digoxin (LANOXIN) 0.125 MG tablet TAKE 1 TABLET BY MOUTH DAILY Patient taking differently: Take 0.125 mg by mouth daily.  04/05/18   Evans Lance, MD  diltiazem (CARDIZEM CD) 180 MG 24 hr capsule TAKE 1 CAPSULE BY MOUTH TWICE A DAY Patient taking differently: Take 180 mg by mouth 2 (two) times daily.  04/05/18   Hawks,  Christy A, FNP  docusate sodium (COLACE) 100 MG capsule Take 200 mg by mouth daily as needed for mild constipation.    [provider]  EPINEPHrine 0.3 mg/0.3 mL IJ SOAJ injection Inject 0.3 mg into the muscle once as needed (For anaphylaxis.).    [provider]  esomeprazole (NEXIUM) 40 MG capsule Take 40 mg by mouth daily.  05/21/18   [provider]  ESTRACE VAGINAL 0.1 MG/GM vaginal cream PLACE 6.14 APPLICATORFUL VAGINALLY WEEKLY AS NEEDED AS DIRECTED 06/18/18   Evelina Dun A, FNP  famotidine (PEPCID) 20 MG tablet Take 1 tablet (20 mg total) by mouth 2 (two) times daily. 09/28/17   Francine Graven, DO  fluticasone (FLONASE) 50 MCG/ACT nasal spray PLACE 1 SPRAY INTO BOTH NOSTRILS 2 (TWO) TIMES DAILY AS NEEDED FOR ALLERGIES OR RHINITIS. Patient taking differently: Place 1 spray into both nostrils daily.  05/14/18   Sharion Balloon, FNP  furosemide (LASIX) 40 MG tablet Take 1.5 tablets (60 mg total) by mouth See admin instructions for 30 days. 07/15/18 08/14/18  Hawks, Alyse Low A, FNP  INCRUSE ELLIPTA 62.5 MCG/INH AEPB INHALE 1 PUFF BY MOUTH EVERY DAY Patient taking differently: Inhale 1 puff into the lungs daily.  03/02/18   Hawks, Alyse Low A, FNP  levocetirizine (XYZAL) 5 MG tablet TAKE 1 TABLET BY MOUTH EVERY DAY IN THE EVENING Patient taking differently: Take 5 mg by mouth at bedtime.  05/06/18   Sharion Balloon, FNP  levothyroxine (SYNTHROID) 112 MCG tablet Take 1 tablet (112 mcg total) by mouth daily.  04/15/18 04/15/19  Sharion Balloon, FNP  metFORMIN (GLUCOPHAGE XR) 500 MG 24 hr tablet Take 1 tablet (500 mg total) by mouth daily with breakfast. 03/02/18 03/02/19  Sharion Balloon, FNP  Multiple Vitamins-Minerals (PRESERVISION AREDS 2) CAPS Take 1 capsule by mouth 2 (two) times daily.    [provider]  nitrofurantoin, macrocrystal-monohydrate, (MACROBID) 100 MG capsule Take 1 capsule (100 mg total) by mouth 2 (two) times daily. 1 po BId 07/01/18   Dettinger, Fransisca Kaufmann, MD  Olopatadine HCl (PATADAY) 0.2 % SOLN Place 1 drop into both eyes daily.    [provider]  ondansetron (ZOFRAN) 8 MG tablet Take 1 tablet (8 mg total) by mouth every 8 (eight) hours as needed for nausea or vomiting. 06/29/18   Evelina Dun A, FNP  OXYGEN Inhale 3 L into the lungs continuous.    [provider]  Polyethyl Glycol-Propyl Glycol (SYSTANE) 0.4-0.3 % GEL ophthalmic gel Place 1 application into both eyes at bedtime as needed (dry eyes).    [provider]  polyethylene glycol powder (GLYCOLAX/MIRALAX) powder TAKE 17 G BY MOUTH 2 (TWO) TIMES DAILY AS NEEDED. Patient taking differently: Take 17 g by mouth 2 (two) times daily as needed (constipation).  01/19/17   Evelina Dun A, FNP  potassium chloride (K-DUR) 10 MEQ tablet Take 1 tablet (10 mEq total) by mouth daily. 06/04/18   Jola Schmidt, MD  rosuvastatin (CRESTOR) 10 MG tablet TAKE 1 TABLET BY MOUTH EVERYDAY AT BEDTIME Patient taking differently: Take 10 mg by mouth at bedtime.  05/06/18   Evelina Dun A, FNP  spironolactone (ALDACTONE) 25 MG tablet TAKE 1 TABLET BY MOUTH EVERY DAY Patient taking differently: Take 25 mg by mouth daily.  05/06/18   Sharion Balloon, FNP  traMADol (ULTRAM) 50 MG tablet Take 1 tablet (50 mg total) by mouth every 6 (six) hours as needed. 09/03/17   Sharion Balloon, FNP  trimethoprim (TRIMPEX)  100 MG tablet TAKE 1 TABLET BY MOUTH EVERY DAY Patient taking differently: Take 100 mg by mouth daily.  Continuous course 05/11/18   Evelina Dun A, FNP  vitamin C (ASCORBIC ACID) 500 MG tablet Take 1,000 mg by mouth daily.     [provider]  Vitamin D, Ergocalciferol, (DRISDOL) 1.25 MG (50000 UT) CAPS capsule TAKE 1 CAPSULE (50,000 UNITS TOTAL) BY MOUTH EVERY 7 (SEVEN) DAYS. Patient taking differently: Take 50,000 Units by mouth every Wednesday.  03/30/18   Sharion Balloon, FNP  vitamin E 400 UNIT capsule Take 400 Units by mouth daily.    [provider]    Family History Family History  Problem Relation Age of Onset  . Diabetes Other   . Hypertension Other   . Coronary artery disease Other   . Stroke Other   . Asthma Other   . Kidney disease Sister   . Cancer Sister        lung  . Lung cancer Sister   . Cancer Sister        lung  . Lung cancer Sister   . Cancer Sister        lung  . Suicidality Son        committed suicide in 2009  . CVA Son   . Hyperlipidemia Other   . Anxiety disorder Other   . Migraines Other   . Heart failure Father   . Healthy Brother   . Cancer Sister        lung  . Healthy Sister     Social History Social History   Tobacco Use  . Smoking status: Never Smoker  . Smokeless tobacco: Never Used  Substance Use Topics  . Alcohol use: No  . Drug use: No     Allergies   Allopurinol and Mobic [meloxicam]   Review of Systems Review of Systems  All other systems reviewed and are negative.    Physical Exam Updated Vital Signs BP (!) 102/55   Pulse 60   Temp (!) 100.8 F (38.2 C) (Rectal)   Resp 18   Ht 5\' 8"  (1.727 m)   Wt 115.7 kg   SpO2 97%   BMI 38.77 kg/m   Physical Exam Vitals signs and nursing note reviewed.  Constitutional:      General: She is not in acute distress.    Appearance: She is well-developed. She is obese. She is ill-appearing and diaphoretic. She is not toxic-appearing.  HENT:     Head: Normocephalic and atraumatic.     Right Ear: External ear normal.     Left Ear: External ear  normal.     Nose: Nose normal. No congestion or rhinorrhea.     Mouth/Throat:     Mouth: Mucous membranes are dry.     Pharynx: No oropharyngeal exudate.  Eyes:     Conjunctiva/sclera: Conjunctivae normal.     Pupils: Pupils are equal, round, and reactive to light.  Neck:     Musculoskeletal: Normal range of motion and neck supple.     Trachea: Phonation normal.  Cardiovascular:     Rate and Rhythm: Regular rhythm. Tachycardia present.  Pulmonary:     Effort: Pulmonary effort is normal. No respiratory distress.     Breath sounds: No stridor.  Chest:     Chest wall: No tenderness.  Abdominal:     Palpations: Abdomen is soft.     Tenderness: There is no abdominal tenderness.  Musculoskeletal: Normal range of motion.  Skin:    General: Skin is warm.     Coloration: Skin is not jaundiced or pale.  Neurological:     Mental Status: She is alert and oriented to person, place, and time.     Cranial Nerves: No cranial nerve deficit.     Sensory: No sensory deficit.     Motor: No abnormal muscle tone.     Coordination: Coordination normal.  Psychiatric:        Mood and Affect: Mood normal.        Behavior: Behavior normal.      ED Treatments / Results  Labs (all labs ordered are listed, but only abnormal results are displayed) Labs Reviewed  COMPREHENSIVE METABOLIC PANEL - Abnormal; Notable for the following components:      Result Value   Glucose, Bld 140 (*)    Creatinine, Ser 1.40 (*)    Albumin 3.4 (*)    AST 44 (*)    GFR calc non Af Amer 36 (*)    GFR calc Af Amer 42 (*)    All other components within normal limits  LACTIC ACID, PLASMA - Abnormal; Notable for the following components:   Lactic Acid, Venous 3.3 (*)    All other components within normal limits  LACTIC ACID, PLASMA - Abnormal; Notable for the following components:   Lactic Acid, Venous 2.3 (*)    All other components within normal limits  CK - Abnormal; Notable for the following components:   Total  CK 23 (*)    All other components within normal limits  URINALYSIS, ROUTINE W REFLEX MICROSCOPIC - Abnormal; Notable for the following components:   Specific Gravity, Urine <1.005 (*)    All other components within normal limits  CULTURE, BLOOD (ROUTINE X 2)  CULTURE, BLOOD (ROUTINE X 2)  CBC WITH DIFFERENTIAL/PLATELET  FERRITIN  C-REACTIVE PROTEIN    EKG EKG Interpretation  Date/Time:  Monday July 19 2018 12:24:32 EDT Ventricular Rate:  101 PR Interval:    QRS Duration: 78 QT Interval:  285 QTC Calculation: 370 R Axis:   70 Text Interpretation:  Atrial fibrillation Low voltage, precordial leads Repol abnrm suggests ischemia, anterolateral since last tracing no significant change Confirmed by Daleen Bo [id number]) on 07/19/2018 4:45:13 PM   Radiology Dg Chest Port 1 View  Result Date: 07/19/2018 CLINICAL DATA:  Fever, chronic cough EXAM: PORTABLE CHEST 1 VIEW COMPARISON:  06/04/2018 FINDINGS: Left subclavian 2 lead pacer again noted. Cardiomegaly with mild vascular congestion and mid and lower lung atelectasis/scarring. No significant superimposed acute airspace process, pneumonia, significant collapse or consolidation. No large effusion or pneumothorax. Trachea midline. Aorta atherosclerotic. Degenerative changes of the spine. IMPRESSION: Stable cardiomegaly and vascular congestion Chronic mid and lower lung scarring versus atelectasis No significant interval change. Electronically Signed   By: Jerilynn Mages.  Shick M.D.   On: 07/19/2018 13:52    Procedures Procedures (including critical care time)  Medications Ordered in ED Medications  acetaminophen (TYLENOL) tablet 650 mg (650 mg Oral Given 07/19/18 1328)  sodium chloride 0.9 % bolus 500 mL (0 mLs Intravenous Stopped 07/19/18 1545)     Initial Impression / Assessment and Plan / ED Course  I have reviewed the triage vital signs and the nursing notes.  Pertinent labs & imaging results that were available during my care of the patient  were reviewed by me and considered in my medical decision making (see chart for details).  Clinical Course as of Jul 18 1708  Mon Jul 19, 2018  1545 Normal except glucose high, creatinine high, albumin low, AST high  Comprehensive metabolic panel(!) [EW]  6269 Abnormal, high  Lactic acid, plasma(!!) [EW]  1546 Normal  Ferritin (Iron Binding Protein) [EW]  1546 Low  CK(!) [EW]  1546 Normal  CBC with Differential [EW]  1546 No infiltrate or CHF, images reviewed by me  DG Chest Port 1 View [EW]  4854 I discussed the patient's situation, with her granddaughter Comptroller).  She states that patient is taking trimethoprim as a urinary tract infection preventive.  The only additional history that she knows is that the patient complained of feeling hot and her temperature was taken today, which is what initiated the evaluation in the ED.   [EW]  1636 Normal  Urinalysis, Routine w reflex microscopic(!) [EW]  6270 I discussed the case with the patient's husband.  We discussed the option of hospitalization versus treatment at home, he asked for my input and I was positive for discharge, with close treatment, and care.  Patient's husband is comfortable with this strategy, and will come and get the patient.   [EW]    Clinical Course User Index [EW] Daleen Bo, MD        Patient Vitals for the past 24 hrs:  BP Temp Temp src Pulse Resp SpO2 Height Weight  07/19/18 1545 - - - 60 18 97 % - -  07/19/18 1530 (!) 102/55 - - - 20 - - -  07/19/18 1515 - - - 75 (!) 21 97 % - -  07/19/18 1500 112/72 - - 79 20 97 % - -  07/19/18 1445 - - - (!) 104 (!) 26 97 % - -  07/19/18 1430 107/67 - - (!) 106 (!) 27 96 % - -  07/19/18 1400 133/73 - - 100 19 98 % - -  07/19/18 1336 - (!) 100.8 F (38.2 C) Rectal - - - - -  07/19/18 1330 126/77 - - (!) 108 (!) 26 97 % - -  07/19/18 1315 - - - (!) 128 17 99 % - -  07/19/18 1300 - - - 71 (!) 22 100 % - -  07/19/18 1245 - - - 79 15 98 % - -  07/19/18 1230 112/84  - - (!) 39 11 99 % - -  07/19/18 1216 (!) 144/78 98.9 F (37.2 C) Oral (!) 107 18 100 % - -  07/19/18 1208 - - - - - - 5\' 8"  (1.727 m) 115.7 kg    3:48 PM Reevaluation with update and discussion. After initial assessment and treatment, an updated evaluation reveals no overall change in clinical status. Daleen Bo   Medical Decision Making: Fever with nonspecific respiratory symptoms.  Short-term illness, patient hemodynamically stable.  No known sick contacts.  Symptoms are nonspecific but could be related to Covid-19.  No evidence for infiltrates, with normal oxygenation on her usual nasal cannula supplementation.  Doubt serious bacterial infection, metabolic instability or impending vascular collapse.  Patient is elderly, with confusion, and lives with her elderly husband.  Family members consulted regarding potentially discharging patient home.  Lorraine Leonard was evaluated in Emergency Department on 07/19/2018 for the symptoms described in the history of present illness. She was evaluated in the context of the global COVID-19 pandemic, which necessitated consideration that the patient might be at risk for infection with the SARS-CoV-2 virus that causes COVID-19. Institutional protocols and algorithms that pertain to the evaluation of patients at risk for COVID-19 are in a state  of rapid change based on information released by regulatory bodies including the CDC and federal and state organizations. These policies and algorithms were followed during the patient's care in the ED.  CRITICAL CARE-yes Performed by: Daleen Bo  Nursing Notes Reviewed/ Care Coordinated Applicable Imaging Reviewed Interpretation of Laboratory Data incorporated into ED treatment  The patient appears reasonably screened and/or stabilized for discharge and I doubt any other medical condition or other [facility] requiring further screening, evaluation, or treatment in the ED at this time prior to discharge.  Plan: Home  Medications-APAP, usual medicines; Home Treatments-push fluids, plenty of rest; return here if the recommended treatment, does not improve the symptoms; Recommended follow up-PCP, PRN   Final Clinical Impressions(s) / ED Diagnoses   Final diagnoses:  Febrile illness    ED Discharge Orders    None       Daleen Bo, MD 07/19/18 1710

## 2018-07-19 NOTE — ED Triage Notes (Signed)
Patient reports increasing fever that started last night. Patient has not treated fever. Patient has chronic cough.

## 2018-07-22 ENCOUNTER — Encounter: Payer: Self-pay | Admitting: Family

## 2018-07-22 ENCOUNTER — Telehealth: Payer: Self-pay | Admitting: Family

## 2018-07-22 ENCOUNTER — Ambulatory Visit (INDEPENDENT_AMBULATORY_CARE_PROVIDER_SITE_OTHER): Payer: Medicare Other | Admitting: Family

## 2018-07-22 ENCOUNTER — Other Ambulatory Visit: Payer: Self-pay

## 2018-07-22 DIAGNOSIS — K219 Gastro-esophageal reflux disease without esophagitis: Secondary | ICD-10-CM

## 2018-07-22 DIAGNOSIS — M159 Polyosteoarthritis, unspecified: Secondary | ICD-10-CM | POA: Diagnosis not present

## 2018-07-22 DIAGNOSIS — M1990 Unspecified osteoarthritis, unspecified site: Secondary | ICD-10-CM | POA: Insufficient documentation

## 2018-07-22 MED ORDER — PANTOPRAZOLE SODIUM 40 MG PO TBEC: 40.0000 mg | DELAYED_RELEASE_TABLET | Freq: Every day | ORAL | 3 refills | Status: DC

## 2018-07-22 MED ORDER — DICLOFENAC SODIUM 1 % TD GEL: 2.0000 g | Freq: Four times a day (QID) | TRANSDERMAL | 3 refills | Status: DC

## 2018-07-22 NOTE — Progress Notes (Signed)
Virtual Visit via telephone Note  I connected with Lorraine Leonard on 07/22/18 at 10:42 AM  by telephone and verified that I am speaking with the correct person using two identifiers. Lorraine Leonard is currently located at home and no one is currently with her during visit. The provider, Evelina Dun, FNP is located in their office at time of visit.  I discussed the limitations, risks, security and privacy concerns of performing an evaluation and management service by telephone and the availability of in person appointments. I also discussed with the patient that there may be a patient responsible charge related to this service. The patient expressed understanding and agreed to proceed.   History and Present Illness:  Pt complaining of epigastric pain that is chronic. She reports the pain is worse when laying down and improves when she sits up. She is a poor historian and can not recall her medications. States her granddaughter fixes them.  Called granddaughter and updated med list. She is currently taking Nexium with no relief and uses Tums that helps.   Abdominal Pain  This is a chronic problem. The current episode started more than 1 year ago. The onset quality is gradual. The problem occurs constantly. The problem has been gradually worsening. The pain is located in the epigastric region. The pain is at a severity of 10/10 (when laying down). The pain is moderate. The quality of the pain is aching. The abdominal pain does not radiate. Associated symptoms include nausea ("every once in awhile"). Pertinent negatives include no constipation, diarrhea, frequency, hematuria or vomiting. Exacerbated by: laying down. Relieved by: sitting up. She has tried antacids for the symptoms. The treatment provided mild relief. Her past medical history is significant for GERD.  Arthritis  Presents for follow-up visit. She complains of pain, stiffness and joint swelling. The symptoms have been worsening. Affected  locations include the right knee, left knee, left foot and right foot. Her pain is at a severity of 10/10. Pertinent negatives include no diarrhea.     Review of Systems  Gastrointestinal: Positive for abdominal pain and nausea ("every once in awhile"). Negative for constipation, diarrhea and vomiting.  Genitourinary: Negative for frequency and hematuria.  Musculoskeletal: Positive for arthritis, joint swelling and stiffness.    Observations/Objective: SOB present when she answered phone, states she had to walk to answer phone. Improved at end of conversation.   Assessment and Plan: 1. Gastroesophageal reflux disease, esophagitis presence not specified Will change Nexium to Protonix 40 mg  -Diet discussed- Avoid fried, spicy, citrus foods, caffeine and alcohol -Do not eat 2-3 hours before bedtime -Encouraged small frequent meals -Avoid NSAID's -RTO if symptoms worsen or do not improve  - pantoprazole (PROTONIX) 40 MG tablet; Take 1 tablet (40 mg total) by mouth daily.  Dispense: 30 tablet; Refill: 3  2. Osteoarthritis of multiple joints, unspecified osteoarthritis type Rest ROM exercises encouraged  - diclofenac sodium (VOLTAREN) 1 % GEL; Apply 2 g topically 4 (four) times daily.  Dispense: 100 g; Refill: 3  Hospital labs reviewed and notes. Pt will need to have repeat labs in next 2-4 weeks.    I discussed the assessment and treatment plan with the patient. The patient was provided an opportunity to ask questions and all were answered. The patient agreed with the plan and demonstrated an understanding of the instructions.   The patient was advised to call back or seek an in-person evaluation if the symptoms worsen or if the condition fails to improve  as anticipated.  The above assessment and management plan was discussed with the patient. The patient verbalized understanding of and has agreed to the management plan. Patient is aware to call the clinic if symptoms persist or worsen.  Patient is aware when to return to the clinic for a follow-up visit. Patient educated on when it is appropriate to go to the emergency department.    Call ended 11:14AM,  provided 32 minutes of non-face-to-face time during this encounter.    Evelina Dun, FNP

## 2018-07-22 NOTE — Telephone Encounter (Signed)
Telephone visit made for today

## 2018-07-22 NOTE — Telephone Encounter (Signed)
PT is wanting to talk to nurse about her stomach hurting and it has been hurting forever really bad she says. And she is wanting to talk to nurse also about her legs, feet up to knee is swollen and they are hurting really bad too.

## 2018-07-24 LAB — CULTURE, BLOOD (ROUTINE X 2)
Culture: NO GROWTH
Culture: NO GROWTH
Special Requests: ADEQUATE
Special Requests: ADEQUATE

## 2018-07-29 ENCOUNTER — Other Ambulatory Visit: Payer: Self-pay | Admitting: Family

## 2018-07-29 ENCOUNTER — Ambulatory Visit (INDEPENDENT_AMBULATORY_CARE_PROVIDER_SITE_OTHER): Payer: Medicare Other | Admitting: Family

## 2018-07-29 ENCOUNTER — Encounter: Payer: Self-pay | Admitting: Family

## 2018-07-29 ENCOUNTER — Other Ambulatory Visit: Payer: Self-pay

## 2018-07-29 VITALS — BP 122/68 | HR 88 | Temp 98.4°F | Resp 18 | Wt 250.0 lb

## 2018-07-29 DIAGNOSIS — J449 Chronic obstructive pulmonary disease, unspecified: Secondary | ICD-10-CM | POA: Diagnosis not present

## 2018-07-29 DIAGNOSIS — J8410 Pulmonary fibrosis, unspecified: Secondary | ICD-10-CM

## 2018-07-29 DIAGNOSIS — R609 Edema, unspecified: Secondary | ICD-10-CM

## 2018-07-29 DIAGNOSIS — I5031 Acute diastolic (congestive) heart failure: Secondary | ICD-10-CM | POA: Diagnosis not present

## 2018-07-29 DIAGNOSIS — F419 Anxiety disorder, unspecified: Secondary | ICD-10-CM

## 2018-07-29 DIAGNOSIS — K219 Gastro-esophageal reflux disease without esophagitis: Secondary | ICD-10-CM

## 2018-07-29 DIAGNOSIS — Z09 Encounter for follow-up examination after completed treatment for conditions other than malignant neoplasm: Secondary | ICD-10-CM

## 2018-07-29 MED ORDER — OMEPRAZOLE 20 MG PO CPDR: 20.0000 mg | DELAYED_RELEASE_CAPSULE | Freq: Every day | ORAL | 1 refills | Status: DC

## 2018-07-29 NOTE — Telephone Encounter (Signed)
Looks like this was discontinued on 07/22/2018.  Patient requesting refill.

## 2018-07-29 NOTE — Progress Notes (Signed)
Subjective:    Patient ID: Lorraine Leonard, female    DOB: 10-Jul-1940, 78 y.o.   MRN: [id number]  PT presents to the office today for hospital follow up, abdominal pain, and recheck peripheral edema.  Pt has CHF and is currently alternating  60 mg and 40 mg every other day. She states she can not weight herself, because she can not read the numbers on her scale.   She always has SOB and states this is normal. She is on continuous 3 L of O2.  She has COPD and Pulmonary Fibrosis and is followed by Pul monologists annually.   She has a follow up appt nephrologists  Abdominal Pain  This is a recurrent problem. The current episode started more than 1 month ago. The onset quality is gradual. The problem occurs intermittently. The problem has been waxing and waning. The pain is located in the RUQ and LUQ. The pain is at a severity of 10/10. The pain is moderate. The quality of the pain is cramping. The abdominal pain does not radiate. Associated symptoms include belching, constipation, diarrhea and nausea. Pertinent negatives include no frequency, hematuria or vomiting. Her past medical history is significant for GERD.  Gastroesophageal Reflux  She complains of abdominal pain, belching, heartburn and nausea. This is a chronic problem. The current episode started more than 1 year ago. The problem occurs frequently. The problem has been waxing and waning. The symptoms are aggravated by certain foods. She has tried a PPI for the symptoms. The treatment provided mild relief.  Anxiety  Presents for follow-up visit. Symptoms include decreased concentration, depressed mood, excessive worry, insomnia, irritability, nausea, nervous/anxious behavior and shortness of breath. Symptoms occur most days. The severity of symptoms is moderate.        Review of Systems  Constitutional: Positive for irritability.  Respiratory: Positive for shortness of breath.   Gastrointestinal: Positive for abdominal pain,  constipation, diarrhea, heartburn and nausea. Negative for vomiting.  Genitourinary: Negative for frequency and hematuria.  Neurological: Positive for weakness.  Psychiatric/Behavioral: Positive for decreased concentration. The patient is nervous/anxious and has insomnia.   All other systems reviewed and are negative.      Objective:   Physical Exam Vitals signs reviewed.  Constitutional:      General: She is not in acute distress.    Appearance: She is well-developed.  HENT:     Head: Normocephalic and atraumatic.     Right Ear: Tympanic membrane normal.     Left Ear: Tympanic membrane normal.  Eyes:     Pupils: Pupils are equal, round, and reactive to light.  Neck:     Musculoskeletal: Normal range of motion and neck supple.     Thyroid: No thyromegaly.  Cardiovascular:     Rate and Rhythm: Normal rate and regular rhythm.     Heart sounds: Normal heart sounds. No murmur.  Pulmonary:     Effort: Pulmonary effort is normal. No respiratory distress.     Breath sounds: Decreased breath sounds present. No wheezing.  Abdominal:     General: Bowel sounds are normal. There is no distension.     Palpations: Abdomen is soft.     Tenderness: There is no abdominal tenderness.  Musculoskeletal: Normal range of motion.        General: No tenderness.  Skin:    General: Skin is warm and dry.  Neurological:     Mental Status: She is alert and oriented to person, place, and time.  Cranial Nerves: No cranial nerve deficit.     Deep Tendon Reflexes: Reflexes are normal and symmetric.  Psychiatric:        Mood and Affect: Mood is anxious.        Behavior: Behavior normal.        Thought Content: Thought content normal.        Judgment: Judgment normal.       BP 122/68   Pulse 88   Temp 98.4 F (36.9 C) (Oral)   Resp 18   Wt 250 lb (113.4 kg)   SpO2 94% Comment: 3 L O2  BMI 38.01 kg/m      Assessment & Plan:  Lorraine Leonard comes in today with chief complaint of No chief  complaint on file.   Diagnosis and orders addressed:  1. Acute diastolic CHF (congestive heart failure) ([place])  2. Anxiety  3. Chronic obstructive pulmonary disease, unspecified COPD type ([place])   4. Pulmonary fibrosis ([place])  5. Gastroesophageal reflux disease, esophagitis presence not specified -Diet discussed- Avoid fried, spicy, citrus foods, caffeine and alcohol -Do not eat 2-3 hours before bedtime -Encouraged small frequent meals -Avoid NSAID's - omeprazole (PRILOSEC) 20 MG capsule; Take 1 capsule (20 mg total) by mouth daily.  Dispense: 90 capsule; Refill: 1  6. Peripheral edema Compression hose Will check on referral for CHF  Low salt diet Keep elevated  7. Hospital discharge follow-up    Labs reviewed from ED Health Maintenance reviewed Diet and exercise encouraged  Follow up plan: 3 months   Evelina Dun, FNP

## 2018-08-02 ENCOUNTER — Ambulatory Visit: Payer: Self-pay | Admitting: Licensed Clinical Social Worker

## 2018-08-02 DIAGNOSIS — E1159 Type 2 diabetes mellitus with other circulatory complications: Secondary | ICD-10-CM

## 2018-08-02 DIAGNOSIS — F331 Major depressive disorder, recurrent, moderate: Secondary | ICD-10-CM

## 2018-08-02 DIAGNOSIS — J449 Chronic obstructive pulmonary disease, unspecified: Secondary | ICD-10-CM

## 2018-08-02 DIAGNOSIS — N182 Chronic kidney disease, stage 2 (mild): Secondary | ICD-10-CM

## 2018-08-02 DIAGNOSIS — I152 Hypertension secondary to endocrine disorders: Secondary | ICD-10-CM

## 2018-08-02 DIAGNOSIS — K219 Gastro-esophageal reflux disease without esophagitis: Secondary | ICD-10-CM

## 2018-08-02 DIAGNOSIS — I10 Essential (primary) hypertension: Secondary | ICD-10-CM

## 2018-08-02 DIAGNOSIS — F419 Anxiety disorder, unspecified: Secondary | ICD-10-CM

## 2018-08-02 NOTE — Patient Instructions (Signed)
Licensed Clinical Social Worker Visit Information  Materials provided: No  Ms. Clyatt was given information about Chronic Care Management services today including:  1. CCM service includes personalized support from designated clinical staff supervised by her physician, including individualized plan of care and coordination with other care providers 2. 24/7 contact phone numbers for assistance for urgent and routine care needs. 3. Service will only be billed when office clinical staff spend 20 minutes or more in a month to coordinate care. 4. Only one practitioner may furnish and bill the service in a calendar month. 5. The patient may stop CCM services at any time (effective at the end of the month) by phone call to the office staff. 6. The patient will be responsible for cost sharing (co-pay) of up to 20% of the service fee (after annual deductible is met).  Patient did not agree to services and wishes to consider information provided before deciding about enrollment in CCM services.   Follow Up Plan:  RN CM to call client in next 2 weeks to talk further with client about CCM nursing support services  The patient verbalized understanding of instructions provided today and declined a print copy of patient instruction materials.   Norva Riffle.Phelan Schadt MSW, LCSW Licensed Clinical Social Worker [facility] [phone]

## 2018-08-02 NOTE — Chronic Care Management (AMB) (Addendum)
  Care Management Note   Lorraine Leonard is a 78 y.o. year old female who is a primary care patient of Sharion Balloon, FNP. The CM team was consulted for assistance with chronic disease management and care coordination.   I reached out to Lorraine Leonard by phone today.   Lorraine Leonard was given information about Chronic Care Management services today including:  1. CCM service includes personalized support from designated clinical staff supervised by her physician, including individualized plan of care and coordination with other care providers 2. 24/7 contact phone numbers for assistance for urgent and routine care needs. 3. Service will only be billed when office clinical staff spend 20 minutes or more in a month to coordinate care. 4. Only one practitioner may furnish and bill the service in a calendar month. 5. The patient may stop CCM services at any time (effective at the end of the month) by phone call to the office staff. 6. The patient will be responsible for cost sharing (co-pay) of up to 20% of the service fee (after annual deductible is met). Patient did not agree to services and wishes to consider information provided before deciding about enrollment in CCM services.    Review of patient status, including review of consultants reports, relevant laboratory and other test results, and collaboration with appropriate care team members and the patient's provider was performed as part of comprehensive patient evaluation and provision of chronic care management services.   Follow Up Plan: RN CM to call client in next 2 weeks to talk with client about CCM nursing support services.  Lorraine Leonard MSW, LCSW Licensed Clinical Social Worker [facility] [phone]  I have reviewed and agree with the above  documentation.   Evelina Dun, FNP

## 2018-08-05 ENCOUNTER — Other Ambulatory Visit: Payer: Self-pay | Admitting: Family

## 2018-08-05 MED ORDER — NITROFURANTOIN MONOHYD MACRO 100 MG PO CAPS: 100.0000 mg | ORAL_CAPSULE | Freq: Every day | ORAL | 1 refills | Status: DC

## 2018-08-09 ENCOUNTER — Other Ambulatory Visit: Payer: Self-pay | Admitting: Family

## 2018-08-09 DIAGNOSIS — F419 Anxiety disorder, unspecified: Secondary | ICD-10-CM

## 2018-08-10 ENCOUNTER — Other Ambulatory Visit: Payer: Self-pay | Admitting: Family

## 2018-08-10 NOTE — Telephone Encounter (Signed)
Last seen 07/29/2018  [facility]

## 2018-08-11 ENCOUNTER — Telehealth: Payer: Medicare Other

## 2018-08-12 ENCOUNTER — Ambulatory Visit: Payer: Medicare Other | Admitting: *Deleted

## 2018-08-18 ENCOUNTER — Telehealth: Payer: Self-pay | Admitting: Internal Medicine

## 2018-08-18 NOTE — Telephone Encounter (Signed)
Virtual Visit Pre-Appointment Phone Call  "(Name), I am calling you today to discuss your upcoming appointment. We are currently trying to limit exposure to the virus that causes COVID-19 by seeing patients at home rather than in the office."  1. "What is the BEST phone number to call the day of the visit?" - include this in appointment notes  2. Do you have or have access to (through a family member/friend) a smartphone with video capability that we can use for your visit?" a. If yes - list this number in appt notes as cell (if different from BEST phone #) and list the appointment type as a VIDEO visit in appointment notes b. If no - list the appointment type as a PHONE visit in appointment notes  3. Confirm consent - "In the setting of the current Covid19 crisis, you are scheduled for a (phone or video) visit with your provider on (date) at (time).  Just as we do with many in-office visits, in order for you to participate in this visit, we must obtain consent.  If you'd like, I can send this to your mychart (if signed up) or email for you to review.  Otherwise, I can obtain your verbal consent now.  All virtual visits are billed to your insurance company just like a normal visit would be.  By agreeing to a virtual visit, we'd like you to understand that the technology does not allow for your provider to perform an examination, and thus may limit your provider's ability to fully assess your condition. If your provider identifies any concerns that need to be evaluated in person, we will make arrangements to do so.  Finally, though the technology is pretty good, we cannot assure that it will always work on either your or our end, and in the setting of a video visit, we may have to convert it to a phone-only visit.  In either situation, we cannot ensure that we have a secure connection.  Are you willing to proceed?" STAFF: Did the patient verbally acknowledge consent to telehealth visit? Document  YES/NO here: Yes  4. Advise patient to be prepared - "Two hours prior to your appointment, go ahead and check your blood pressure, pulse, oxygen saturation, and your weight (if you have the equipment to check those) and write them all down. When your visit starts, your provider will ask you for this information. If you have an Apple Watch or Kardia device, please plan to have heart rate information ready on the day of your appointment. Please have a pen and paper handy nearby the day of the visit as well."  5. Give patient instructions for MyChart download to smartphone OR [email or web address] as below if video visit (depending on what platform provider is using)  6. Inform patient they will receive a phone call 15 minutes prior to their appointment time (may be from unknown caller ID) so they should be prepared to answer    TELEPHONE CALL NOTE  Lorraine Leonard has been deemed a candidate for a follow-up tele-health visit to limit community exposure during the Covid-19 pandemic. I spoke with the patient via phone to ensure availability of phone/video source, confirm preferred email & phone number, and discuss instructions and expectations.  I reminded Lorraine Leonard to be prepared with any vital sign and/or heart rhythm information that could potentially be obtained via home monitoring, at the time of her visit. I reminded Lorraine Leonard to expect a phone call prior to  her visit.  [name] 08/18/2018 3:53 PM

## 2018-08-25 ENCOUNTER — Encounter: Payer: Self-pay | Admitting: *Deleted

## 2018-08-26 ENCOUNTER — Encounter: Payer: Self-pay | Admitting: Internal Medicine

## 2018-08-27 ENCOUNTER — Telehealth (INDEPENDENT_AMBULATORY_CARE_PROVIDER_SITE_OTHER): Payer: Medicare Other | Admitting: Internal Medicine

## 2018-08-27 VITALS — BP 134/77 | HR 88 | Temp 97.9°F | Ht 68.0 in | Wt 246.5 lb

## 2018-08-27 DIAGNOSIS — Z950 Presence of cardiac pacemaker: Secondary | ICD-10-CM | POA: Diagnosis not present

## 2018-08-27 DIAGNOSIS — I4891 Unspecified atrial fibrillation: Secondary | ICD-10-CM | POA: Diagnosis not present

## 2018-08-27 MED ORDER — FUROSEMIDE 40 MG PO TABS: 80.0000 mg | ORAL_TABLET | Freq: Every day | ORAL | 3 refills | Status: DC

## 2018-08-27 NOTE — Progress Notes (Signed)
Electrophysiology TeleHealth Note   Due to national recommendations of social distancing due to COVID 19, an audio/video telehealth visit is felt to be most appropriate for this patient at this time.  See MyChart message from today for the patient's consent to telehealth for [facility].   Date:  08/27/2018   ID:  Lorraine Leonard, DOB Nov 05, 1940, MRN [id number]  Location: patient's home  Provider location: [address], [place]  Evaluation Performed: Follow-up visit  PCP:  Sharion Balloon, FNP  Cardiologist:  Carlyle Dolly, MD  Electrophysiologist:  Dr Lovena Le  Chief Complaint:  "I been swelling. I drink a lot of water"  History of Present Illness:    Lorraine Leonard is a 78 y.o. female who presents via audio/video conferencing for a telehealth visit today. She has a h/o diastolic heart failure. She has been drinking water all of the time. " I thought I was supposed to". She notes peripheral edema.  Since last being seen in our clinic, the patient reports no chest pain.  Today, she denies symptoms of palpitations, chest pain, shortness of breath,  lower extremity edema, dizziness, presyncope, or syncope.  The patient is otherwise without complaint today.  The patient denies symptoms of fevers, chills, cough, or new SOB worrisome for COVID 19.  Past Medical History:  Diagnosis Date  . Acute diastolic CHF (congestive heart failure) ([place]) 11/11/2016  . Allergic rhinitis   . Anxiety   . Anxiety   . Arthritis    "~ all my joints" (05/18/2017)  . Atrial fibrillation ([place]) 09/2016  . Atrial fibrillation with RVR ([employer]) 05/18/2017  . Chronic lower back pain   . COPD (chronic obstructive pulmonary disease) ([place])   . Depression   . Diverticulosis   . Dyspnea   . Dysrhythmia   . GERD (gastroesophageal reflux disease)   . Gout   . 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 Hyperlipidemia   . Hypertension   . Hypothyroidism   . Macular  degeneration of both eyes   . Neuropathy   . On home oxygen therapy    "3L; 24/7" (05/18/2017)  . Pre-diabetes   . 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 Pulmonary fibrosis ([place])   . Pulmonary fibrosis ([place])   . Rheumatoid arthritis ([place])   . Sleep apnea    uses 3 liters of o2 24/7  . Urticaria     Past Surgical History:  Procedure Laterality Date  . AV NODE ABLATION  05/18/2017  . AV NODE ABLATION N/A 05/18/2017   Procedure: AV NODE ABLATION;  Surgeon: Evans Lance, MD;  Location: [facility];  Service: Cardiovascular;  Laterality: N/A;  . BACK SURGERY    . BREAST LUMPECTOMY Left   . CARDIOVERSION N/A 01/30/2017   Procedure: CARDIOVERSION;  Surgeon: Josue Hector, MD;  [facility];  Service: Cardiovascular;  Laterality: N/A;  . CARDIOVERSION N/A 04/09/2017   Procedure: CARDIOVERSION;  Surgeon: Satira Sark, MD;  [facility];  Service: Cardiovascular;  Laterality: N/A;  . CATARACT EXTRACTION W/ INTRAOCULAR LENS  IMPLANT, BILATERAL Bilateral   . DILATION AND CURETTAGE OF UTERUS    . EUS N/A 10/03/2015   Procedure: UPPER ENDOSCOPIC ULTRASOUND (EUS) RADIAL;  Surgeon: Arta Silence, MD;  Location: [facility];  Service: Endoscopy;  Laterality: N/A;  . FINE NEEDLE ASPIRATION N/A 10/03/2015   Procedure: FINE NEEDLE ASPIRATION (FNA)  RADIAL;  Surgeon: Arta Silence, MD;  Location: [facility];  Service: Endoscopy;  Laterality: N/A;  . INSERT / REPLACE / REMOVE PACEMAKER  05/18/2017  . LUMBAR LAMINECTOMY  1995; 06/23/2008   L4-5/notes 08/20/2010  . 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 PACEMAKER IMPLANT N/A 05/18/2017   Procedure: PACEMAKER IMPLANT;  Surgeon: Evans Lance, MD;  Location: [facility];  Service: Cardiovascular;  Laterality: N/A;  . TOTAL ABDOMINAL HYSTERECTOMY    . TUBAL LIGATION      Current Outpatient Medications  Medication Sig Dispense Refill  . acetaminophen (TYLENOL) 500 MG  tablet Take 500 mg by mouth every 6 (six) hours as needed (pain).    Marland Kitchen albuterol (PROVENTIL) (2.5 MG/3ML) 0.083% nebulizer solution Take 3 mLs (2.5 mg total) by nebulization every 6 (six) hours as needed for wheezing or shortness of breath. J96.01 150 mL 1  . apixaban (ELIQUIS) 5 MG TABS tablet Take 1 tablet (5 mg total) by mouth 2 (two) times daily. 180 tablet 0  . calcium citrate (CALCITRATE - DOSED IN MG ELEMENTAL CALCIUM) 950 MG tablet Take 1 tablet by mouth 2 (two) times daily.     . cyanocobalamin 1000 MCG tablet Take 1,000 mcg by mouth daily. Vitamin b12 every AM    . diazepam (VALIUM) 5 MG tablet TAKE 1 TABLET BY MOUTH TWICE A DAY AS NEEDED FOR ANXIETY 60 tablet 2  . diclofenac sodium (VOLTAREN) 1 % GEL Apply 2 g topically 4 (four) times daily. (Patient taking differently: Apply 2 g topically 4 (four) times daily. AS NEEDED) 100 g 3  . digoxin (LANOXIN) 0.125 MG tablet Take 0.125 mg by mouth every morning.    . docusate sodium (COLACE) 100 MG capsule Take 200 mg by mouth daily as needed for mild constipation.    Marland Kitchen EPINEPHrine 0.3 mg/0.3 mL IJ SOAJ injection Inject 0.3 mg into the muscle once as needed (For anaphylaxis.).    Marland Kitchen ESTRACE VAGINAL 0.1 MG/GM vaginal cream PLACE 5.78 APPLICATORFUL VAGINALLY WEEKLY AS NEEDED AS DIRECTED 42.5 g 1  . fluticasone (FLONASE) 50 MCG/ACT nasal spray PLACE 1 SPRAY INTO BOTH NOSTRILS 2 (TWO) TIMES DAILY AS NEEDED FOR ALLERGIES OR RHINITIS. (Patient taking differently: Place 1 spray into both nostrils daily. ) 48 g 5  . furosemide (LASIX) 40 MG tablet Take one tab (40mg ) alternating with 1 1/2 tabs (60mg ) every other day    . INCRUSE ELLIPTA 62.5 MCG/INH AEPB INHALE 1 PUFF BY MOUTH EVERY DAY (Patient taking differently: Inhale 1 puff into the lungs daily. As needed) 90 each 0  . levocetirizine (XYZAL) 5 MG tablet TAKE 1 TABLET BY MOUTH EVERY DAY IN THE EVENING (Patient taking differently: Take 5 mg by mouth at bedtime. ) 90 tablet 1  . levothyroxine (SYNTHROID)  112 MCG tablet Take 112 mcg by mouth every morning.    . metFORMIN (GLUCOPHAGE-XR) 500 MG 24 hr tablet TAKE 1 TABLET BY MOUTH EVERY DAY WITH BREAKFAST 90 tablet 0  . Multiple Vitamins-Minerals (PRESERVISION AREDS 2) CAPS Take 1 capsule by mouth 2 (two) times daily.    . nitrofurantoin (MACRODANTIN) 100 MG capsule Take 100 mg by mouth every morning.    . Olopatadine HCl (PATADAY) 0.2 % SOLN Place 1 drop into both eyes daily.    Marland Kitchen omeprazole (PRILOSEC) 20 MG capsule Take 20 mg by mouth every morning.    . ondansetron (ZOFRAN) 8 MG tablet Take 1 tablet (8 mg total) by mouth  every 8 (eight) hours as needed for nausea or vomiting. 20 tablet 2  . OXYGEN Inhale 3 L into the lungs continuous.    Vladimir Faster Glycol-Propyl Glycol (SYSTANE) 0.4-0.3 % GEL ophthalmic gel Place 1 application into both eyes at bedtime as needed (dry eyes).    . rosuvastatin (CRESTOR) 10 MG tablet TAKE 1 TABLET BY MOUTH EVERYDAY AT BEDTIME 90 tablet 1  . spironolactone (ALDACTONE) 25 MG tablet Take 25 mg by mouth every morning.    . vitamin C (ASCORBIC ACID) 500 MG tablet Take 1,000 mg by mouth daily. AM    . Vitamin D, Ergocalciferol, (DRISDOL) 1.25 MG (50000 UT) CAPS capsule TAKE 1 CAPSULE (50,000 UNITS TOTAL) BY MOUTH EVERY 7 (SEVEN) DAYS. 12 capsule 1  . vitamin E 400 UNIT capsule Take 400 Units by mouth daily. AM     No current facility-administered medications for this visit.     Allergies:   Allopurinol and Mobic [meloxicam]   Social History:  The patient  reports that she has never smoked. She has never used smokeless tobacco. She reports that she does not drink alcohol or use drugs.   Family History:  The patient's  family history includes Anxiety disorder in an other family member; Asthma in an other family member; CVA in her son; Cancer in her sister, sister, sister, and sister; Coronary artery disease in an other family member; Diabetes in an other family member; Healthy in her brother and sister; Heart failure in  her father; Hyperlipidemia in an other family member; Hypertension in an other family member; Kidney disease in her sister; Lung cancer in her sister and sister; Migraines in an other family member; Stroke in an other family member; Suicidality in her son.   ROS:  Please see the history of present illness.   All other systems are personally reviewed and negative.    Exam:    Vital Signs:  BP 134/77   Pulse 88   Temp 97.9 F (36.6 C) (Oral)   Ht 5\' 8"  (1.727 m)   Wt 246 lb 8 oz (111.8 kg)   BMI 37.48 kg/m    Labs/Other Tests and Data Reviewed:    Recent Labs: 02/26/2018: TSH 6.270 06/04/2018: Magnesium 1.7 07/19/2018: ALT 17; BUN 11; Creatinine, Ser 1.40; Hemoglobin 12.6; Platelets 251; Potassium 3.8; Sodium 137   Wt Readings from Last 3 Encounters:  08/26/18 246 lb 8 oz (111.8 kg)  07/29/18 250 lb (113.4 kg)  07/19/18 255 lb (115.7 kg)     Other studies personally reviewed:  Last device remote is reviewed from [facility] PDF dated 06/08/18 which reveals normal device function, no arrythmias   ASSESSMENT & PLAN:    1.  Chronic diastolic heart failure - She has taken low dose lasix. I have asked her to increase the lasix to 80 mg daily.  In addition, I asked her to quit drinking extra fluid. 2. PPM - her medtronic DDD PM with a His bundle and RV leads are working normally. 3. Atrial fib -her rate control is much better. She is pacing over 75% of the time.  4. COVID 19 screen The patient denies symptoms of COVID 19 at this time.  The importance of social distancing was discussed today.  Follow-up:  3 months Next remote: next week or two  Current medicines are reviewed at length with the patient today.   The patient does not have concerns regarding her medicines.  The following changes were made today:  none  Labs/ tests  ordered today include:  No orders of the defined types were placed in this encounter.    Patient Risk:  after full review of this patients clinical  status, I feel that they are at moderate risk at this time.  Today, I have spent 25 minutes with the patient with telehealth technology discussing all of the above.    Signed, Cristopher Peru, MD  08/27/2018 10:01 AM     [facility] [address] [place] [address] [phone] (office) [phone] (fax)

## 2018-08-27 NOTE — Patient Instructions (Addendum)
Medication Instructions:  INCREASE Lasix to 80 mg daily  Stop drinking extra fluids  Labwork: None today  Procedures/Testing: None today  Follow-Up: 3 months with Dr.Taylor  Any Additional Special Instructions Will Be Listed Below (If Applicable).     If you need a refill on your cardiac medications before your next appointment, please call your pharmacy.

## 2018-08-27 NOTE — Addendum Note (Signed)
Addended by: Barbarann Ehlers A on: 08/27/2018 10:41 AM   Modules accepted: Orders

## 2018-09-06 ENCOUNTER — Other Ambulatory Visit: Payer: Self-pay

## 2018-09-06 ENCOUNTER — Ambulatory Visit (INDEPENDENT_AMBULATORY_CARE_PROVIDER_SITE_OTHER): Payer: Medicare Other | Admitting: *Deleted

## 2018-09-06 DIAGNOSIS — I4891 Unspecified atrial fibrillation: Secondary | ICD-10-CM | POA: Diagnosis not present

## 2018-09-07 LAB — CUP PACEART REMOTE DEVICE CHECK
Battery Remaining Longevity: 138 mo
Battery Voltage: 3 V
Brady Statistic AP VP Percent: 1 %
Brady Statistic AP VS Percent: 16.35 %
Brady Statistic AS VP Percent: 0 %
Brady Statistic AS VS Percent: 82.64 %
Brady Statistic RA Percent Paced: 48.11 %
Brady Statistic RV Percent Paced: 1 %
Date Time Interrogation Session: 20200518073435
Implantable Lead Implant Date: 20190128
Implantable Lead Implant Date: 20190128
Implantable Lead Location: 753860
Implantable Lead Location: 753860
Implantable Lead Model: 3830
Implantable Lead Model: 5076
Implantable Pulse Generator Implant Date: 20190128
Lead Channel Impedance Value: 285 Ohm
Lead Channel Impedance Value: 342 Ohm
Lead Channel Impedance Value: 380 Ohm
Lead Channel Impedance Value: 418 Ohm
Lead Channel Sensing Intrinsic Amplitude: 2.25 mV
Lead Channel Sensing Intrinsic Amplitude: 2.25 mV
Lead Channel Sensing Intrinsic Amplitude: 9.75 mV
Lead Channel Sensing Intrinsic Amplitude: 9.75 mV
Lead Channel Setting Pacing Amplitude: 2.5 V
Lead Channel Setting Pacing Amplitude: 3.5 V
Lead Channel Setting Pacing Pulse Width: 0.4 ms
Lead Channel Setting Sensing Sensitivity: 0.9 mV

## 2018-09-09 ENCOUNTER — Other Ambulatory Visit: Payer: Self-pay | Admitting: Family

## 2018-09-14 ENCOUNTER — Other Ambulatory Visit: Payer: Self-pay | Admitting: Family

## 2018-09-14 ENCOUNTER — Encounter: Payer: Self-pay | Admitting: Cardiology

## 2018-09-14 NOTE — Progress Notes (Signed)
Remote pacemaker transmission.   

## 2018-10-06 ENCOUNTER — Encounter (INDEPENDENT_AMBULATORY_CARE_PROVIDER_SITE_OTHER): Payer: Medicare Other | Admitting: Ophthalmology

## 2018-10-19 ENCOUNTER — Other Ambulatory Visit: Payer: Self-pay

## 2018-10-19 ENCOUNTER — Encounter: Payer: Self-pay | Admitting: Family Medicine

## 2018-10-19 ENCOUNTER — Telehealth: Payer: Medicare Other

## 2018-10-19 ENCOUNTER — Ambulatory Visit (INDEPENDENT_AMBULATORY_CARE_PROVIDER_SITE_OTHER): Payer: Medicare Other | Admitting: Family Medicine

## 2018-10-19 ENCOUNTER — Other Ambulatory Visit: Payer: Self-pay | Admitting: Family

## 2018-10-19 DIAGNOSIS — L501 Idiopathic urticaria: Secondary | ICD-10-CM | POA: Diagnosis not present

## 2018-10-19 MED ORDER — PREDNISONE 20 MG PO TABS: ORAL_TABLET | ORAL | 0 refills | Status: DC

## 2018-10-19 MED ORDER — EPINEPHRINE 0.3 MG/0.3ML IJ SOAJ: 0.3000 mg | Freq: Once | INTRAMUSCULAR | 3 refills | Status: AC | PRN

## 2018-10-19 MED ORDER — FAMOTIDINE 20 MG PO TABS: 20.0000 mg | ORAL_TABLET | Freq: Two times a day (BID) | ORAL | 0 refills | Status: DC

## 2018-10-19 NOTE — Progress Notes (Signed)
Virtual Visit via telephone Note Due to COVID-19, visit is conducted virtually and was requested by patient. This visit type was conducted due to national recommendations for restrictions regarding the COVID-19 Pandemic (e.g. social distancing) in an effort to limit this patient's exposure and mitigate transmission in our community. All issues noted in this document were discussed and addressed.  A physical exam was not performed with this format.   I connected with Lorraine Leonard on 10/19/18 at 1025 by telephone and verified that I am speaking with the correct person using two identifiers. Lorraine Leonard is currently located at home and family is currently with them during visit. The provider, Monia Pouch, FNP is located in their office at time of visit.  I discussed the limitations, risks, security and privacy concerns of performing an evaluation and management service by telephone and the availability of in person appointments. I also discussed with the patient that there may be a patient responsible charge related to this service. The patient expressed understanding and agreed to proceed.  Subjective:  Patient ID: Lorraine Leonard, female    DOB: 17-Jul-1940, 78 y.o.   MRN: [id number]  Chief Complaint:  Urticaria   HPI: Lorraine Leonard is a 78 y.o. female presenting on 10/19/2018 for Urticaria   Pt reports hives to her torso, arms, and legs. Pt states this started on Friday and it gradually getting better. No known exposures or new products in the home. No new pets. States she has had issues with this in the past and she was seen by an allergist but is not sure the cause of the rash. She denies facial swelling, angioedema, throat swelling, headaches, cough, dizziness, shortness of breath, or syncope. No cheat pain, weakness, or confusion.   Urticaria This is a recurrent problem. The current episode started in the past 7 days. The problem has been gradually improving since onset. The affected  locations include the torso, left arm, right arm, left upper leg and right upper leg. The rash is characterized by redness, swelling and itchiness. She was exposed to nothing. Pertinent negatives include no anorexia, congestion, cough, diarrhea, eye pain, facial edema, fatigue, fever, joint pain, nail changes, rhinorrhea, shortness of breath, sore throat or vomiting. Past treatments include antihistamine. The treatment provided no relief.     Relevant past medical, surgical, family, and social history reviewed and updated as indicated.  Allergies and medications reviewed and updated.   Past Medical History:  Diagnosis Date   Acute diastolic CHF (congestive heart failure) (HCC) 11/11/2016   Allergic rhinitis    Anxiety    Anxiety    Arthritis    "~ all my joints" (05/18/2017)   Atrial fibrillation ([place]) 09/2016   Atrial fibrillation with RVR ([place]) 05/18/2017   Chronic lower back pain    COPD (chronic obstructive pulmonary disease) (HCC)    Depression    Diverticulosis    Dyspnea    Dysrhythmia    GERD (gastroesophageal reflux disease)    Gout    History of blood transfusion 1983   "when I gave a 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"   Hyperlipidemia    Hypertension    Hypothyroidism    Macular degeneration of both eyes    Neuropathy    On home oxygen therapy    "3L; 24/7" (05/18/2017)   Pre-diabetes    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   Pulmonary fibrosis ([place])    Pulmonary fibrosis (HCC)    Rheumatoid arthritis ([place])  Sleep apnea    uses 3 liters of o2 24/7   Urticaria     Past Surgical History:  Procedure Laterality Date   AV NODE ABLATION  05/18/2017   AV NODE ABLATION N/A 05/18/2017   Procedure: AV NODE ABLATION;  Surgeon: Evans Lance, MD;  Location: [facility];  Service: Cardiovascular;  Laterality: N/A;   BACK SURGERY     BREAST LUMPECTOMY Left    CARDIOVERSION N/A 01/30/2017   Procedure: CARDIOVERSION;  Surgeon:  Josue Hector, MD;  [facility];  Service: Cardiovascular;  Laterality: N/A;   CARDIOVERSION N/A 04/09/2017   Procedure: CARDIOVERSION;  Surgeon: Satira Sark, MD;  [facility];  Service: Cardiovascular;  Laterality: N/A;   CATARACT EXTRACTION W/ INTRAOCULAR LENS  IMPLANT, BILATERAL Bilateral    DILATION AND CURETTAGE OF UTERUS     EUS N/A 10/03/2015   Procedure: UPPER ENDOSCOPIC ULTRASOUND (EUS) RADIAL;  Surgeon: Arta Silence, MD;  Location: [facility];  Service: Endoscopy;  Laterality: N/A;   FINE NEEDLE ASPIRATION N/A 10/03/2015   Procedure: FINE NEEDLE ASPIRATION (FNA) RADIAL;  Surgeon: Arta Silence, MD;  Location: [facility];  Service: Endoscopy;  Laterality: N/A;   INSERT / REPLACE / REMOVE PACEMAKER  05/18/2017   LUMBAR LAMINECTOMY  1995; 06/23/2008   L4-5/notes 08/20/2010   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   PACEMAKER IMPLANT N/A 05/18/2017   Procedure: PACEMAKER IMPLANT;  Surgeon: Evans Lance, MD;  Location: [facility];  Service: Cardiovascular;  Laterality: N/A;   TOTAL ABDOMINAL HYSTERECTOMY     TUBAL LIGATION      Social History   Socioeconomic History   Marital status: Married    Spouse name: Mortimer Fries   Number of children: 4   Years of education: Not on file   Highest education level: Not on file  Occupational History   Occupation: retired    Comment: cna  Scientist, product/process development strain: Not hard at all   Food insecurity    Worry: Never true    Inability: Never true   Transportation needs    Medical: No    Non-medical: No  Tobacco Use   Smoking status: Never Smoker   Smokeless tobacco: Never Used  Substance and Sexual Activity   Alcohol use: No   Drug use: No   Sexual activity: Not Currently    Birth control/protection: Surgical  Lifestyle   Physical activity    Days per week: 0 days    Minutes per session: 0 min   Stress: Rather much    Relationships   Social connections    Talks on phone: More than three times a week    Gets together: More than three times a week    Attends religious service: Never    Active member of club or organization: No    Attends meetings of clubs or organizations: Never    Relationship status: Married   Intimate partner violence    Fear of current or ex partner: No    Emotionally abused: No    Physically abused: No    Forced sexual activity: No  Other Topics Concern   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   Pt lives with her husband in a single wide trailer. They have 4 children and 13 grandchildren.     Outpatient  Encounter Medications as of 10/19/2018  Medication Sig   acetaminophen (TYLENOL) 500 MG tablet Take 500 mg by mouth every 6 (six) hours as needed (pain).   albuterol (PROVENTIL) (2.5 MG/3ML) 0.083% nebulizer solution Take 3 mLs (2.5 mg total) by nebulization every 6 (six) hours as needed for wheezing or shortness of breath. J96.01   calcium citrate (CALCITRATE - DOSED IN MG ELEMENTAL CALCIUM) 950 MG tablet Take 1 tablet by mouth 2 (two) times daily.    cyanocobalamin 1000 MCG tablet Take 1,000 mcg by mouth daily. Vitamin b12 every AM   diazepam (VALIUM) 5 MG tablet TAKE 1 TABLET BY MOUTH TWICE A DAY AS NEEDED FOR ANXIETY   diclofenac sodium (VOLTAREN) 1 % GEL Apply 2 g topically 4 (four) times daily. (Patient taking differently: Apply 2 g topically 4 (four) times daily. AS NEEDED)   digoxin (LANOXIN) 0.125 MG tablet Take 0.125 mg by mouth every morning.   docusate sodium (COLACE) 100 MG capsule Take 200 mg by mouth daily as needed for mild constipation.   ELIQUIS 5 MG TABS tablet TAKE 1 TABLET BY MOUTH TWICE A DAY   EPINEPHrine 0.3 mg/0.3 mL IJ SOAJ injection Inject 0.3 mLs (0.3 mg total) into the muscle once as needed for up to 1 day (For anaphylaxis.).   ESTRACE VAGINAL 0.1 MG/GM vaginal cream  PLACE 2.67 APPLICATORFUL VAGINALLY WEEKLY AS NEEDED AS DIRECTED   famotidine (PEPCID) 20 MG tablet Take 1 tablet (20 mg total) by mouth 2 (two) times daily for 14 days.   fluticasone (FLONASE) 50 MCG/ACT nasal spray PLACE 1 SPRAY INTO BOTH NOSTRILS 2 (TWO) TIMES DAILY AS NEEDED FOR ALLERGIES OR RHINITIS. (Patient taking differently: Place 1 spray into both nostrils daily. )   furosemide (LASIX) 40 MG tablet Take 2 tablets (80 mg total) by mouth daily.   INCRUSE ELLIPTA 62.5 MCG/INH AEPB INHALE 1 PUFF BY MOUTH EVERY DAY (Patient taking differently: Inhale 1 puff into the lungs daily. As needed)   levocetirizine (XYZAL) 5 MG tablet TAKE 1 TABLET BY MOUTH EVERY DAY IN THE EVENING (Patient taking differently: Take 5 mg by mouth at bedtime. )   levothyroxine (SYNTHROID) 112 MCG tablet Take 112 mcg by mouth every morning.   metFORMIN (GLUCOPHAGE-XR) 500 MG 24 hr tablet TAKE 1 TABLET BY MOUTH EVERY DAY WITH BREAKFAST   Multiple Vitamins-Minerals (PRESERVISION AREDS 2) CAPS Take 1 capsule by mouth 2 (two) times daily.   nitrofurantoin (MACRODANTIN) 100 MG capsule Take 100 mg by mouth every morning.   Olopatadine HCl (PATADAY) 0.2 % SOLN Place 1 drop into both eyes daily.   omeprazole (PRILOSEC) 20 MG capsule Take 20 mg by mouth every morning.   ondansetron (ZOFRAN) 8 MG tablet Take 1 tablet (8 mg total) by mouth every 8 (eight) hours as needed for nausea or vomiting.   OXYGEN Inhale 3 L into the lungs continuous.   Polyethyl Glycol-Propyl Glycol (SYSTANE) 0.4-0.3 % GEL ophthalmic gel Place 1 application into both eyes at bedtime as needed (dry eyes).   predniSONE (DELTASONE) 20 MG tablet 2 po at sametime daily for 5 days   rosuvastatin (CRESTOR) 10 MG tablet TAKE 1 TABLET BY MOUTH EVERYDAY AT BEDTIME   spironolactone (ALDACTONE) 25 MG tablet Take 25 mg by mouth every morning.   vitamin C (ASCORBIC ACID) 500 MG tablet Take 1,000 mg by mouth daily. AM   Vitamin D, Ergocalciferol,  (DRISDOL) 1.25 MG (50000 UT) CAPS capsule TAKE 1 CAPSULE (50,000 UNITS TOTAL) BY MOUTH EVERY 7 (SEVEN)  DAYS.   vitamin E 400 UNIT capsule Take 400 Units by mouth daily. AM   [DISCONTINUED] EPINEPHrine 0.3 mg/0.3 mL IJ SOAJ injection Inject 0.3 mg into the muscle once as needed (For anaphylaxis.).   No facility-administered encounter medications on file as of 10/19/2018.     Allergies  Allergen Reactions   Allopurinol Swelling    Feet and legs swell   Mobic [Meloxicam] Swelling and Other (See Comments)    Feet and Leg swelling    Review of Systems  Constitutional: Negative for chills, diaphoresis, fatigue and fever.  HENT: Negative for congestion, drooling, facial swelling, rhinorrhea, sore throat, trouble swallowing and voice change.   Eyes: Negative for pain.  Respiratory: Negative for cough, choking, chest tightness, shortness of breath, wheezing and stridor.   Cardiovascular: Negative for chest pain and palpitations.  Gastrointestinal: Negative for abdominal pain, anorexia, diarrhea and vomiting.  Musculoskeletal: Negative for joint pain.  Skin: Positive for color change and rash. Negative for nail changes.  Neurological: Negative for dizziness, syncope, weakness, light-headedness and headaches.  Psychiatric/Behavioral: Negative for confusion.  All other systems reviewed and are negative.        Observations/Objective: No vital signs or physical exam, this was a telephone or virtual health encounter.  Pt alert and oriented, answers all questions appropriately, and able to speak in full sentences.    Assessment and Plan: Theresea was seen today for urticaria.  Diagnoses and all orders for this visit:  Idiopathic urticaria Recurrent idiopathic urticaria. No known exposure or trigger. No signs of anaphylaxis. Epi pen has expired, will refill. Pt aware of how and when to use. Pt aware if she does use she needs to go to the ED. Pt to continue Xyzal daily. Will add steroid  burst and H2 antihistamine  for 14 days. Report any new or worsening symptoms. May need referral back to allergist if this persists.  -     EPINEPHrine 0.3 mg/0.3 mL IJ SOAJ injection; Inject 0.3 mLs (0.3 mg total) into the muscle once as needed for up to 1 day (For anaphylaxis.). -     predniSONE (DELTASONE) 20 MG tablet; 2 po at sametime daily for 5 days -     famotidine (PEPCID) 20 MG tablet; Take 1 tablet (20 mg total) by mouth 2 (two) times daily for 14 days.     Follow Up Instructions: Return if symptoms worsen or fail to improve.    I discussed the assessment and treatment plan with the patient. The patient was provided an opportunity to ask questions and all were answered. The patient agreed with the plan and demonstrated an understanding of the instructions.   The patient was advised to call back or seek an in-person evaluation if the symptoms worsen or if the condition fails to improve as anticipated.  The above assessment and management plan was discussed with the patient. The patient verbalized understanding of and has agreed to the management plan. Patient is aware to call the clinic if symptoms persist or worsen. Patient is aware when to return to the clinic for a follow-up visit. Patient educated on when it is appropriate to go to the emergency department.    I provided 15 minutes of non-face-to-face time during this encounter. The call started at 1025. The call ended at 1040. The other time was used for coordination of care.    Monia Pouch, FNP-C [facility] [address] [place], [place] [address] [phone]

## 2018-10-27 ENCOUNTER — Other Ambulatory Visit: Payer: Self-pay

## 2018-10-28 ENCOUNTER — Other Ambulatory Visit: Payer: Self-pay | Admitting: Family

## 2018-10-28 ENCOUNTER — Ambulatory Visit (INDEPENDENT_AMBULATORY_CARE_PROVIDER_SITE_OTHER): Payer: Medicare Other | Admitting: Family

## 2018-10-28 ENCOUNTER — Encounter: Payer: Self-pay | Admitting: Family

## 2018-10-28 VITALS — BP 134/73 | HR 77 | Temp 98.6°F | Ht 68.0 in | Wt 233.4 lb

## 2018-10-28 DIAGNOSIS — E1169 Type 2 diabetes mellitus with other specified complication: Secondary | ICD-10-CM

## 2018-10-28 DIAGNOSIS — J449 Chronic obstructive pulmonary disease, unspecified: Secondary | ICD-10-CM | POA: Diagnosis not present

## 2018-10-28 DIAGNOSIS — E1159 Type 2 diabetes mellitus with other circulatory complications: Secondary | ICD-10-CM

## 2018-10-28 DIAGNOSIS — N3000 Acute cystitis without hematuria: Secondary | ICD-10-CM

## 2018-10-28 DIAGNOSIS — I5031 Acute diastolic (congestive) heart failure: Secondary | ICD-10-CM

## 2018-10-28 DIAGNOSIS — L509 Urticaria, unspecified: Secondary | ICD-10-CM

## 2018-10-28 DIAGNOSIS — I272 Pulmonary hypertension, unspecified: Secondary | ICD-10-CM

## 2018-10-28 DIAGNOSIS — I4891 Unspecified atrial fibrillation: Secondary | ICD-10-CM

## 2018-10-28 DIAGNOSIS — E1142 Type 2 diabetes mellitus with diabetic polyneuropathy: Secondary | ICD-10-CM

## 2018-10-28 DIAGNOSIS — N182 Chronic kidney disease, stage 2 (mild): Secondary | ICD-10-CM

## 2018-10-28 DIAGNOSIS — I10 Essential (primary) hypertension: Secondary | ICD-10-CM

## 2018-10-28 DIAGNOSIS — J8410 Pulmonary fibrosis, unspecified: Secondary | ICD-10-CM

## 2018-10-28 DIAGNOSIS — I480 Paroxysmal atrial fibrillation: Secondary | ICD-10-CM

## 2018-10-28 DIAGNOSIS — I152 Hypertension secondary to endocrine disorders: Secondary | ICD-10-CM

## 2018-10-28 DIAGNOSIS — M159 Polyosteoarthritis, unspecified: Secondary | ICD-10-CM

## 2018-10-28 DIAGNOSIS — R350 Frequency of micturition: Secondary | ICD-10-CM

## 2018-10-28 DIAGNOSIS — E039 Hypothyroidism, unspecified: Secondary | ICD-10-CM

## 2018-10-28 DIAGNOSIS — E785 Hyperlipidemia, unspecified: Secondary | ICD-10-CM

## 2018-10-28 DIAGNOSIS — F331 Major depressive disorder, recurrent, moderate: Secondary | ICD-10-CM

## 2018-10-28 DIAGNOSIS — K219 Gastro-esophageal reflux disease without esophagitis: Secondary | ICD-10-CM

## 2018-10-28 DIAGNOSIS — F419 Anxiety disorder, unspecified: Secondary | ICD-10-CM

## 2018-10-28 LAB — URINALYSIS, COMPLETE
Bilirubin, UA: NEGATIVE
Glucose, UA: NEGATIVE
Ketones, UA: NEGATIVE
Nitrite, UA: NEGATIVE
Protein,UA: NEGATIVE
Specific Gravity, UA: 1.02 (ref 1.005–1.030)
Urobilinogen, Ur: 0.2 mg/dL (ref 0.2–1.0)
pH, UA: 7 (ref 5.0–7.5)

## 2018-10-28 LAB — MICROSCOPIC EXAMINATION: WBC, UA: >30 /hpf — ABNORMAL HIGH (ref 0–5)

## 2018-10-28 LAB — BAYER DCA HB A1C WAIVED: HB A1C (BAYER DCA - WAIVED): 7 % — ABNORMAL HIGH (ref <7.0)

## 2018-10-28 NOTE — Patient Instructions (Signed)

## 2018-10-28 NOTE — Addendum Note (Signed)
Addended by: Evelina Dun A on: 10/28/2018 04:01 PM   Modules accepted: Orders

## 2018-10-28 NOTE — Progress Notes (Signed)
Subjective:    Patient ID: Lorraine Leonard, female    DOB: 05-21-1940, 78 y.o.   MRN: [id number]  Chief Complaint  Patient presents with  . Medical Management of Chronic Issues   Pt presents to the office today for chronic follow up. She is followed by Cardiologists every 6 months for CHF & A Fib. She is followed by Pulmonologist annually for pulmonary hypertension and Pulmonary Fibrosis. She is on continuous 3 l of O2.   She reports she has lost 17 lbs since our last visit.  Hypertension This is a chronic problem. The current episode started more than 1 year ago. The problem has been resolved since onset. The problem is controlled. Associated symptoms include anxiety, chest pain, malaise/fatigue and shortness of breath. Pertinent negatives include no blurred vision or peripheral edema (greatly improved). Risk factors for coronary artery disease include dyslipidemia, diabetes mellitus and obesity. The current treatment provides moderate improvement. Hypertensive end-organ damage includes kidney disease and heart failure. Identifiable causes of hypertension include a thyroid problem.  Diabetes She presents for her follow-up diabetic visit. She has type 2 diabetes mellitus. Hypoglycemia symptoms include nervousness/anxiousness. Associated symptoms include chest pain and fatigue. Pertinent negatives for diabetes include no blurred vision. Symptoms are stable. Diabetic complications include heart disease and nephropathy. Risk factors for coronary artery disease include dyslipidemia, diabetes mellitus, hypertension and sedentary lifestyle. She is following a generally unhealthy diet. Her overall blood glucose range is >200 mg/dl.  Gastroesophageal Reflux She complains of chest pain, heartburn and nausea. This is a chronic problem. The current episode started more than 1 year ago. The problem occurs occasionally. The symptoms are aggravated by certain foods. Associated symptoms include fatigue. Risk  factors include obesity. She has tried a PPI for the symptoms. The treatment provided moderate relief.  Arthritis Presents for follow-up visit. She complains of pain and stiffness. She reports no joint warmth. The symptoms have been stable. Affected locations include the right knee, left knee, right hip and left hip. Her pain is at a severity of 10/10. Associated symptoms include fatigue.  Thyroid Problem Presents for follow-up visit. Symptoms include anxiety, depressed mood and fatigue. The symptoms have been stable. Her past medical history is significant for heart failure.  Anxiety Presents for follow-up visit. Symptoms include chest pain, depressed mood, excessive worry, nausea, nervous/anxious behavior and shortness of breath. Symptoms occur occasionally. The severity of symptoms is moderate. The quality of sleep is good.    Depression        This is a chronic problem.  The current episode started more than 1 year ago.   The onset quality is gradual.   The problem occurs intermittently.  The problem has been waxing and waning since onset.  Associated symptoms include fatigue, irritable, decreased interest and sad.  Associated symptoms include no helplessness and no hopelessness.  Past medical history includes thyroid problem and anxiety.   Urticaria This is a new problem. The current episode started more than 1 month ago. The problem has been resolved since onset. The affected locations include the chest and head. The rash is characterized by itchiness and redness. Associated symptoms include fatigue and shortness of breath. Past treatments include antihistamine and oral steroids. The treatment provided significant relief.      Review of Systems  Constitutional: Positive for fatigue and malaise/fatigue.  Eyes: Negative for blurred vision.  Respiratory: Positive for shortness of breath.   Cardiovascular: Positive for chest pain.  Gastrointestinal: Positive for heartburn and nausea.  Musculoskeletal: Positive for arthritis and stiffness.  Psychiatric/Behavioral: Positive for depression. The patient is nervous/anxious.   All other systems reviewed and are negative.      Objective:   Physical Exam Vitals signs reviewed.  Constitutional:      General: She is irritable. She is not in acute distress.    Appearance: She is well-developed. She is obese.  HENT:     Head: Normocephalic and atraumatic.     Right Ear: Tympanic membrane normal.     Left Ear: Tympanic membrane normal.  Eyes:     Pupils: Pupils are equal, round, and reactive to light.  Neck:     Musculoskeletal: Normal range of motion and neck supple.     Thyroid: No thyromegaly.  Cardiovascular:     Rate and Rhythm: Normal rate and regular rhythm.     Heart sounds: Normal heart sounds. No murmur.  Pulmonary:     Effort: Pulmonary effort is normal. No respiratory distress.     Breath sounds: Decreased breath sounds present. No wheezing.     Comments: 3L of O2 Abdominal:     General: Bowel sounds are normal. There is no distension.     Palpations: Abdomen is soft.     Tenderness: There is no abdominal tenderness.  Musculoskeletal: Normal range of motion.        General: No tenderness.  Skin:    General: Skin is warm and dry.  Neurological:     Mental Status: She is alert and oriented to person, place, and time.     Cranial Nerves: No cranial nerve deficit.     Deep Tendon Reflexes: Reflexes are normal and symmetric.  Psychiatric:        Behavior: Behavior normal.        Thought Content: Thought content normal.        Judgment: Judgment normal.     Diabetic Foot Exam - Simple   Simple Foot Form Diabetic Foot exam was performed with the following findings: Yes 10/28/2018  1:14 PM  Visual Inspection No deformities, no ulcerations, no other skin breakdown bilaterally: Yes Sensation Testing See comments: Yes Pulse Check Posterior Tibialis and Dorsalis pulse intact bilaterally: Yes Comments  Negative monofilament on great toe, positive all other spots      BP 134/73   Pulse 77   Temp 98.6 F (37 C) (Oral)   Ht '5\' 8"'$  (1.727 m)   Wt 233 lb 6.4 oz (105.9 kg)   BMI 35.49 kg/m      Assessment & Plan:  ANWITHA MAPES comes in today with chief complaint of Medical Management of Chronic Issues   Diagnosis and orders addressed:  1. Hypertension associated with diabetes ([place]) - CMP14+EGFR - CBC with Differential/Platelet  2. Atrial fibrillation, unspecified type ([place]) - CMP14+EGFR - CBC with Differential/Platelet  3. Acute diastolic CHF (congestive heart failure) (HCC) - CMP14+EGFR - CBC with Differential/Platelet  4. Chronic obstructive pulmonary disease, unspecified COPD type ([place]) - CMP14+EGFR - CBC with Differential/Platelet  5. Gastroesophageal reflux disease, esophagitis presence not specified - CMP14+EGFR - CBC with Differential/Platelet  6. Urinary frequency - Urinalysis, Complete - CMP14+EGFR - CBC with Differential/Platelet  7. Hypothyroidism, unspecified type - CMP14+EGFR - CBC with Differential/Platelet - TSH  8. Hyperlipidemia associated with type 2 diabetes mellitus (HCC) - CMP14+EGFR - CBC with Differential/Platelet - Lipid panel  9. Type 2 diabetes mellitus with diabetic polyneuropathy, without long-term current use of insulin (HCC) - Bayer DCA Hb A1c Waived - CMP14+EGFR - CBC  with Differential/Platelet - Microalbumin / creatinine urine ratio  10. Osteoarthritis of multiple joints, unspecified osteoarthritis type - CMP14+EGFR - CBC with Differential/Platelet  11. Morbid obesity ([place]) - CMP14+EGFR - CBC with Differential/Platelet  12. Moderate episode of recurrent major depressive disorder (HCC) - CMP14+EGFR - CBC with Differential/Platelet  13. Anxiety - CMP14+EGFR - CBC with Differential/Platelet  14. Pulmonary hypertension (HCC) - CMP14+EGFR - CBC with Differential/Platelet  15. Paroxysmal atrial fibrillation (HCC)  - CMP14+EGFR - CBC with Differential/Platelet  16. Pulmonary fibrosis (HCC) - CMP14+EGFR - CBC with Differential/Platelet  17. CKD (chronic kidney disease) stage 2, GFR 60-89 ml/min - CMP14+EGFR - CBC with Differential/Platelet  18. Urticaria - Alpha-Gal Panel   Labs pending Health Maintenance reviewed- Will get diabetic eye exam scanned into chart Diet and exercise encouraged  Follow up plan: 3 months   Evelina Dun, FNP

## 2018-10-29 ENCOUNTER — Other Ambulatory Visit: Payer: Self-pay | Admitting: Family

## 2018-10-29 LAB — MICROALBUMIN / CREATININE URINE RATIO
Creatinine, Urine: 14.6 mg/dL
Microalb/Creat Ratio: 27 mg/g creat (ref 0–29)
Microalbumin, Urine: 4 ug/mL

## 2018-10-29 MED ORDER — LEVOTHYROXINE SODIUM 125 MCG PO TABS: 125.0000 ug | ORAL_TABLET | Freq: Every day | ORAL | 1 refills | Status: DC

## 2018-10-29 MED ORDER — CEPHALEXIN 500 MG PO CAPS: 500.0000 mg | ORAL_CAPSULE | Freq: Two times a day (BID) | ORAL | 0 refills | Status: DC

## 2018-10-31 LAB — URINE CULTURE

## 2018-11-01 ENCOUNTER — Encounter: Payer: Self-pay | Admitting: *Deleted

## 2018-11-01 ENCOUNTER — Other Ambulatory Visit: Payer: Self-pay | Admitting: Family

## 2018-11-01 MED ORDER — SULFAMETHOXAZOLE-TRIMETHOPRIM 800-160 MG PO TABS: 1.0000 | ORAL_TABLET | Freq: Two times a day (BID) | ORAL | 0 refills | Status: DC

## 2018-11-04 LAB — LIPID PANEL
Chol/HDL Ratio: 3.8 ratio (ref 0.0–4.4)
Cholesterol, Total: 128 mg/dL (ref 100–199)
HDL: 34 mg/dL — ABNORMAL LOW (ref >39)
LDL Calculated: 51 mg/dL (ref 0–99)
Triglycerides: 215 mg/dL — ABNORMAL HIGH (ref 0–149)
VLDL Cholesterol Cal: 43 mg/dL — ABNORMAL HIGH (ref 5–40)

## 2018-11-04 LAB — CBC WITH DIFFERENTIAL/PLATELET
Basophils Absolute: 0.1 10*3/uL (ref 0.0–0.2)
Basos: 1 %
EOS (ABSOLUTE): 0.3 10*3/uL (ref 0.0–0.4)
Eos: 3 %
Hematocrit: 40 % (ref 34.0–46.6)
Hemoglobin: 12.4 g/dL (ref 11.1–15.9)
Immature Grans (Abs): 0 10*3/uL (ref 0.0–0.1)
Immature Granulocytes: 0 %
Lymphocytes Absolute: 2 10*3/uL (ref 0.7–3.1)
Lymphs: 18 %
MCH: 28.4 pg (ref 26.6–33.0)
MCHC: 31 g/dL — ABNORMAL LOW (ref 31.5–35.7)
MCV: 92 fL (ref 79–97)
Monocytes Absolute: 1 10*3/uL — ABNORMAL HIGH (ref 0.1–0.9)
Monocytes: 9 %
Neutrophils Absolute: 8 10*3/uL — ABNORMAL HIGH (ref 1.4–7.0)
Neutrophils: 69 %
Platelets: 274 10*3/uL (ref 150–450)
RBC: 4.36 x10E6/uL (ref 3.77–5.28)
RDW: 13.2 % (ref 11.7–15.4)
WBC: 11.6 10*3/uL — ABNORMAL HIGH (ref 3.4–10.8)

## 2018-11-04 LAB — CMP14+EGFR
ALT: 32 IU/L (ref 0–32)
AST: 60 IU/L — ABNORMAL HIGH (ref 0–40)
Albumin/Globulin Ratio: 1.3 (ref 1.2–2.2)
Albumin: 4.2 g/dL (ref 3.7–4.7)
Alkaline Phosphatase: 92 IU/L (ref 39–117)
BUN/Creatinine Ratio: 12 (ref 12–28)
BUN: 14 mg/dL (ref 8–27)
Bilirubin Total: 0.4 mg/dL (ref 0.0–1.2)
CO2: 30 mmol/L — ABNORMAL HIGH (ref 20–29)
Calcium: 10.3 mg/dL (ref 8.7–10.3)
Chloride: 97 mmol/L (ref 96–106)
Creatinine, Ser: 1.13 mg/dL — ABNORMAL HIGH (ref 0.57–1.00)
GFR calc Af Amer: 54 mL/min/{1.73_m2} — ABNORMAL LOW (ref >59)
GFR calc non Af Amer: 47 mL/min/{1.73_m2} — ABNORMAL LOW (ref >59)
Globulin, Total: 3.2 g/dL (ref 1.5–4.5)
Glucose: 218 mg/dL — ABNORMAL HIGH (ref 65–99)
Potassium: 4.3 mmol/L (ref 3.5–5.2)
Sodium: 142 mmol/L (ref 134–144)
Total Protein: 7.4 g/dL (ref 6.0–8.5)

## 2018-11-04 LAB — TSH: TSH: 10.19 u[IU]/mL — ABNORMAL HIGH (ref 0.450–4.500)

## 2018-11-04 LAB — ALPHA-GAL PANEL
Alpha Gal IgE*: <0.1 kU/L (ref <0.10)
Beef (Bos spp) IgE: <0.1 kU/L (ref <0.35)
Class Interpretation: 0
Class Interpretation: 0
Class Interpretation: 0
Lamb/Mutton (Ovis spp) IgE: <0.1 kU/L (ref <0.35)
Pork (Sus spp) IgE: <0.1 kU/L (ref <0.35)

## 2018-11-05 ENCOUNTER — Other Ambulatory Visit: Payer: Self-pay | Admitting: Family

## 2018-11-11 ENCOUNTER — Ambulatory Visit (INDEPENDENT_AMBULATORY_CARE_PROVIDER_SITE_OTHER): Payer: Medicare Other | Admitting: Family Medicine

## 2018-11-11 ENCOUNTER — Encounter: Payer: Self-pay | Admitting: Family Medicine

## 2018-11-11 ENCOUNTER — Other Ambulatory Visit: Payer: Self-pay

## 2018-11-11 DIAGNOSIS — N390 Urinary tract infection, site not specified: Secondary | ICD-10-CM

## 2018-11-11 DIAGNOSIS — L509 Urticaria, unspecified: Secondary | ICD-10-CM | POA: Diagnosis not present

## 2018-11-11 MED ORDER — SULFAMETHOXAZOLE-TRIMETHOPRIM 800-160 MG PO TABS: 1.0000 | ORAL_TABLET | Freq: Two times a day (BID) | ORAL | 0 refills | Status: DC

## 2018-11-11 NOTE — Progress Notes (Signed)
Virtual Visit via Telephone Note  I connected with Lorraine Leonard on 11/11/18 at 1:03 PM by telephone and verified that I am speaking with the correct person using two identifiers. DEVI Leonard is currently located at home and her husband is currently with her during this visit. The provider, Loman Brooklyn, FNP is located in their home at time of visit.  I discussed the limitations, risks, security and privacy concerns of performing an evaluation and management service by telephone and the availability of in person appointments. I also discussed with the patient that there may be a patient responsible charge related to this service. The patient expressed understanding and agreed to proceed.  Subjective: PCP: Sharion Balloon, FNP  Chief Complaint  Patient presents with  . Urticaria  . Urinary Tract Infection   Urinary Tract Infection: Patient complains of dysuria. She was seen on 10/28/2018 at which time a UA was completed. She was prescribed Keflex while awaiting the urine culture. When the culture resulted the patient was switched to Bactrim, but from chart review it appears the office was unable to reach the patient and sent her a letter. The patient reports she took the entire coarse of Keflex and then the pharmacy told her she had another round of antibiotics. Patient reports she did not go pick up the other antibiotic because she didn't understand why she needed it. Patient does have a history of recurrent UTI. Patient is awaiting a call back from her urologist as well.  Patient reports she has had hives since 1995 that come and go with no apparent cause. She states she is not really having an issue at the present time. She states she is taking Prednisone 20 mg QD and Pepcid BID.    ROS: Per HPI  Current Outpatient Medications:  .  acetaminophen (TYLENOL) 500 MG tablet, Take 500 mg by mouth every 6 (six) hours as needed (pain)., Disp: , Rfl:  .  albuterol (PROVENTIL) (2.5 MG/3ML)  0.083% nebulizer solution, Take 3 mLs (2.5 mg total) by nebulization every 6 (six) hours as needed for wheezing or shortness of breath. J96.01, Disp: 150 mL, Rfl: 1 .  calcium citrate (CALCITRATE - DOSED IN MG ELEMENTAL CALCIUM) 950 MG tablet, Take 1 tablet by mouth 2 (two) times daily. , Disp: , Rfl:  .  cyanocobalamin 1000 MCG tablet, Take 1,000 mcg by mouth daily. Vitamin b12 every AM, Disp: , Rfl:  .  diazepam (VALIUM) 5 MG tablet, TAKE 1 TABLET BY MOUTH TWICE A DAY AS NEEDED FOR ANXIETY, Disp: 60 tablet, Rfl: 2 .  diclofenac sodium (VOLTAREN) 1 % GEL, Apply 2 g topically 4 (four) times daily. (Patient taking differently: Apply 2 g topically 4 (four) times daily. AS NEEDED), Disp: 100 g, Rfl: 3 .  digoxin (LANOXIN) 0.125 MG tablet, Take 0.125 mg by mouth every morning., Disp: , Rfl:  .  docusate sodium (COLACE) 100 MG capsule, Take 200 mg by mouth daily as needed for mild constipation., Disp: , Rfl:  .  ELIQUIS 5 MG TABS tablet, TAKE 1 TABLET BY MOUTH TWICE A DAY, Disp: 180 tablet, Rfl: 0 .  EPINEPHrine 0.3 mg/0.3 mL IJ SOAJ injection, Inject 0.3 mg into the muscle as needed for anaphylaxis., Disp: , Rfl:  .  ESTRACE VAGINAL 0.1 MG/GM vaginal cream, PLACE 6.21 APPLICATORFUL VAGINALLY WEEKLY AS NEEDED AS DIRECTED, Disp: 42.5 g, Rfl: 1 .  famotidine (PEPCID) 20 MG tablet, Take 1 tablet (20 mg total) by mouth 2 (two)  times daily for 14 days., Disp: 28 tablet, Rfl: 0 .  fluticasone (FLONASE) 50 MCG/ACT nasal spray, PLACE 1 SPRAY INTO BOTH NOSTRILS 2 (TWO) TIMES DAILY AS NEEDED FOR ALLERGIES OR RHINITIS. (Patient taking differently: Place 1 spray into both nostrils daily. ), Disp: 48 g, Rfl: 5 .  furosemide (LASIX) 40 MG tablet, Take 2 tablets (80 mg total) by mouth daily., Disp: 180 tablet, Rfl: 3 .  INCRUSE ELLIPTA 62.5 MCG/INH AEPB, INHALE 1 PUFF BY MOUTH EVERY DAY (Patient taking differently: Inhale 1 puff into the lungs daily. As needed), Disp: 90 each, Rfl: 0 .  levocetirizine (XYZAL) 5 MG tablet,  TAKE 1 TABLET BY MOUTH EVERY DAY IN THE EVENING, Disp: 90 tablet, Rfl: 1 .  levothyroxine (SYNTHROID) 125 MCG tablet, Take 1 tablet (125 mcg total) by mouth daily., Disp: 90 tablet, Rfl: 1 .  metFORMIN (GLUCOPHAGE-XR) 500 MG 24 hr tablet, TAKE 1 TABLET BY MOUTH EVERY DAY WITH BREAKFAST, Disp: 90 tablet, Rfl: 0 .  Multiple Vitamins-Minerals (PRESERVISION AREDS 2) CAPS, Take 1 capsule by mouth 2 (two) times daily., Disp: , Rfl:  .  nitrofurantoin (MACRODANTIN) 100 MG capsule, Take 100 mg by mouth every morning., Disp: , Rfl:  .  Olopatadine HCl (PATADAY) 0.2 % SOLN, Place 1 drop into both eyes daily., Disp: , Rfl:  .  omeprazole (PRILOSEC) 20 MG capsule, Take 20 mg by mouth every morning., Disp: , Rfl:  .  ondansetron (ZOFRAN) 8 MG tablet, TAKE 1 TABLET (8 MG TOTAL) BY MOUTH EVERY 8 (EIGHT) HOURS AS NEEDED FOR NAUSEA OR VOMITING., Disp: 20 tablet, Rfl: 2 .  OXYGEN, Inhale 3 L into the lungs continuous., Disp: , Rfl:  .  Polyethyl Glycol-Propyl Glycol (SYSTANE) 0.4-0.3 % GEL ophthalmic gel, Place 1 application into both eyes at bedtime as needed (dry eyes)., Disp: , Rfl:  .  rosuvastatin (CRESTOR) 10 MG tablet, TAKE 1 TABLET BY MOUTH EVERYDAY AT BEDTIME, Disp: 90 tablet, Rfl: 1 .  spironolactone (ALDACTONE) 25 MG tablet, Take 25 mg by mouth every morning., Disp: , Rfl:  .  sulfamethoxazole-trimethoprim (BACTRIM DS) 800-160 MG tablet, Take 1 tablet by mouth 2 (two) times daily., Disp: 14 tablet, Rfl: 0 .  traMADol (ULTRAM) 50 MG tablet, Take 50 mg by mouth., Disp: , Rfl:  .  vitamin C (ASCORBIC ACID) 500 MG tablet, Take 1,000 mg by mouth daily. AM, Disp: , Rfl:  .  Vitamin D, Ergocalciferol, (DRISDOL) 1.25 MG (50000 UT) CAPS capsule, TAKE 1 CAPSULE (50,000 UNITS TOTAL) BY MOUTH EVERY 7 (SEVEN) DAYS., Disp: 12 capsule, Rfl: 1 .  vitamin E 400 UNIT capsule, Take 400 Units by mouth daily. AM, Disp: , Rfl:   Allergies  Allergen Reactions  . Allopurinol Swelling    Feet and legs swell  . Mobic  [Meloxicam] Swelling and Other (See Comments)    Feet and Leg swelling   Past Medical History:  Diagnosis Date  . Acute diastolic CHF (congestive heart failure) ([place]) 11/11/2016  . Allergic rhinitis   . Anxiety   . Anxiety   . Arthritis    "~ all my joints" (05/18/2017)  . Atrial fibrillation ([place]) 09/2016  . Atrial fibrillation with RVR ([place]) 05/18/2017  . Chronic lower back pain   . COPD (chronic obstructive pulmonary disease) ([place])   . Depression   . Diverticulosis   . Dyspnea   . Dysrhythmia   . GERD (gastroesophageal reflux disease)   . Gout   . History of blood transfusion 1983   "  when I gave a 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"  . Hyperlipidemia   . Hypertension   . Hypothyroidism   . Macular degeneration of both eyes   . Neuropathy   . On home oxygen therapy    "3L; 24/7" (05/18/2017)  . Pre-diabetes   . 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 Pulmonary fibrosis ([place])   . Pulmonary fibrosis ([place])   . Rheumatoid arthritis ([place])   . Sleep apnea    uses 3 liters of o2 24/7  . Urticaria     Observations/Objective: Alert. Confused regarding medications.  No respiratory distress or wheezing audible over the phone Mood, judgement, and thought processes all WNL  Assessment and Plan: 1. Recurrent UTI - Bactrim re-ordered as patient's granddaughter had [facility] put the medication back since they were not sure why it was prescribed. Patient encouraged to discuss with her urologist and possibly let them treat her urinary symptoms going forward so that she does not have so many providers treating the same thing. - sulfamethoxazole-trimethoprim (BACTRIM DS) 800-160 MG tablet; Take 1 tablet by mouth 2 (two) times daily.  Dispense: 14 tablet; Refill: 0  2. Hives - Encouraged to use levocetirizine QHS. No current issues.    Follow Up Instructions:  I discussed the assessment and treatment plan with the patient. The patient was provided an opportunity to ask questions and all  were answered. The patient agreed with the plan and demonstrated an understanding of the instructions.   The patient was advised to call back or seek an in-person evaluation if the symptoms worsen or if the condition fails to improve as anticipated.  The above assessment and management plan was discussed with the patient. The patient verbalized understanding of and has agreed to the management plan. Patient is aware to call the clinic if symptoms persist or worsen. Patient is aware when to return to the clinic for a follow-up visit. Patient educated on when it is appropriate to go to the emergency department.   Time call ended: 1:26 PM  I provided 23 minutes of non-face-to-face time during this encounter.  Hendricks Limes, MSN, APRN, FNP-C [facility] 11/11/18

## 2018-11-22 ENCOUNTER — Telehealth: Payer: Self-pay | Admitting: Family

## 2018-11-22 NOTE — Telephone Encounter (Signed)
Patient aware appt is for 12/01/2018 at 1:30pm

## 2018-11-24 ENCOUNTER — Telehealth: Payer: Self-pay | Admitting: Family

## 2018-11-24 ENCOUNTER — Other Ambulatory Visit: Payer: Self-pay | Admitting: Family

## 2018-11-24 MED ORDER — INCRUSE ELLIPTA 62.5 MCG/INH IN AEPB: 1.0000 | INHALATION_SPRAY | Freq: Every day | RESPIRATORY_TRACT | 0 refills | Status: DC

## 2018-11-24 MED ORDER — AZELASTINE HCL 0.1 % NA SOLN: 2.0000 | Freq: Two times a day (BID) | NASAL | 12 refills | Status: DC

## 2018-11-24 NOTE — Telephone Encounter (Signed)
What is the name of the medication? Azelastine nasal spray, encruse? inhaler  Have you contacted your pharmacy to request a refill? yes  Which pharmacy would you like this sent to? [facility]   Patient notified that their request is being sent to the clinical staff for review and that they should receive a call once it is complete. If they do not receive a call within 24 hours they can check with their pharmacy or our office.

## 2018-11-24 NOTE — Telephone Encounter (Signed)
Prescription sent to pharmacy.

## 2018-11-24 NOTE — Telephone Encounter (Signed)
2 meds, astelin is not on list. encruse has not been filled since 11/19

## 2018-11-30 NOTE — Progress Notes (Signed)
Incomplete encounter 

## 2018-12-01 ENCOUNTER — Ambulatory Visit (INDEPENDENT_AMBULATORY_CARE_PROVIDER_SITE_OTHER): Payer: Medicare Other | Admitting: *Deleted

## 2018-12-01 VITALS — Ht 68.0 in | Wt 233.5 lb

## 2018-12-01 DIAGNOSIS — Z0000 Encounter for general adult medical examination without abnormal findings: Secondary | ICD-10-CM

## 2018-12-01 NOTE — Progress Notes (Signed)
MEDICARE ANNUAL WELLNESS VISIT  12/01/2018  Telephone Visit Disclaimer This Medicare AWV was conducted by telephone due to national recommendations for restrictions regarding the COVID-19 Pandemic (e.g. social distancing).  I verified, using two identifiers, that I am speaking with [name] or their authorized healthcare agent. I discussed the limitations, risks, security, and privacy concerns of performing an evaluation and management service by telephone and the potential availability of an in-person appointment in the future. The patient expressed understanding and agreed to proceed.   Subjective:  NANCE MCCOMBS is a 78 y.o. female patient of Hawks, [name], FNP who had a Medicare Annual Wellness Visit today via telephone. Jeroline is Retired and lives with their spouse. she has 4 children. she reports that she is socially active and does interact with friends/family regularly. she is not physically active and enjoys sewing.  Patient Care Team: Sharion Balloon, FNP as PCP - General (Family Medicine) Evans Lance, MD as PCP - Electrophysiology (Cardiology) Harl Bowie Alphonse Guild, MD as PCP - Cardiology (Cardiology) Lamonte Sakai Rose Fillers, MD as Consulting Physician (Pulmonary Disease) Shea Evans Norva Riffle, LCSW as Social Worker (Licensed Clinical Social Worker) Ilean China, RN as Registered Nurse  Advanced Directives 12/01/2018 07/19/2018 06/04/2018 06/16/2017 05/18/2017 04/09/2017 02/10/2017  Does Patient Have a Medical Advance Directive? No No No No No No -  Would patient like information on creating a medical advance directive? Yes (MAU/Ambulatory/Procedural Areas - Information given) No - Patient declined - Yes (MAU/Ambulatory/Procedural Areas - Information given) No - Patient declined No - Patient declined No - Patient declined    Hospital Utilization Over the Past 12 Months: # of hospitalizations or ER visits: 5 # of surgeries: 0  Review of Systems    Patient reports that her  overall health is unchanged compared to last year.  Patient Reported Readings (BP, Pulse, CBG, Weight, etc) none  Review of Systems: History obtained from chart review and the patient General ROS: negative  All other systems negative.  Pain Assessment Pain : No/denies pain     Current Medications & Allergies (verified) Allergies as of 12/01/2018      Reactions   Allopurinol Swelling   Feet and legs swell   Mobic [meloxicam] Swelling, Other (See Comments)   Feet and Leg swelling      Medication List       Accurate as of December 01, 2018  2:17 PM. If you have any questions, ask your nurse or doctor.        acetaminophen 500 MG tablet Commonly known as: TYLENOL Take 500 mg by mouth every 6 (six) hours as needed (pain).   albuterol (2.5 MG/3ML) 0.083% nebulizer solution Commonly known as: PROVENTIL Take 3 mLs (2.5 mg total) by nebulization every 6 (six) hours as needed for wheezing or shortness of breath. J96.01   azelastine 0.1 % nasal spray Commonly known as: ASTELIN Place 2 sprays into both nostrils 2 (two) times daily. Use in each nostril as directed   calcium citrate 950 MG tablet Commonly known as: CALCITRATE - dosed in mg elemental calcium Take 1 tablet by mouth 2 (two) times daily.   cyanocobalamin 1000 MCG tablet Take 1,000 mcg by mouth daily. Vitamin b12 every AM   diazepam 5 MG tablet Commonly known as: VALIUM TAKE 1 TABLET BY MOUTH TWICE A DAY AS NEEDED FOR ANXIETY   diclofenac sodium 1 % Gel Commonly known as: Voltaren Apply 2 g topically 4 (four) times daily. What changed: additional  instructions   digoxin 0.125 MG tablet Commonly known as: LANOXIN Take 0.125 mg by mouth every morning.   docusate sodium 100 MG capsule Commonly known as: COLACE Take 200 mg by mouth daily as needed for mild constipation.   Eliquis 5 MG Tabs tablet Generic drug: apixaban TAKE 1 TABLET BY MOUTH TWICE A DAY   EPINEPHrine 0.3 mg/0.3 mL Soaj injection Commonly  known as: EPI-PEN Inject 0.3 mg into the muscle as needed for anaphylaxis.   ESTRACE VAGINAL 0.1 MG/GM vaginal cream Generic drug: estradiol PLACE 8.92 APPLICATORFUL VAGINALLY WEEKLY AS NEEDED AS DIRECTED   famotidine 20 MG tablet Commonly known as: Pepcid Take 1 tablet (20 mg total) by mouth 2 (two) times daily for 14 days.   fluticasone 50 MCG/ACT nasal spray Commonly known as: FLONASE PLACE 1 SPRAY INTO BOTH NOSTRILS 2 (TWO) TIMES DAILY AS NEEDED FOR ALLERGIES OR RHINITIS. What changed: when to take this   furosemide 40 MG tablet Commonly known as: Lasix Take 2 tablets (80 mg total) by mouth daily.   Incruse Ellipta 62.5 MCG/INH Aepb Generic drug: umeclidinium bromide Inhale 1 puff into the lungs daily. As needed   levocetirizine 5 MG tablet Commonly known as: XYZAL TAKE 1 TABLET BY MOUTH EVERY DAY IN THE EVENING   levothyroxine 125 MCG tablet Commonly known as: Synthroid Take 1 tablet (125 mcg total) by mouth daily.   metFORMIN 500 MG 24 hr tablet Commonly known as: GLUCOPHAGE-XR TAKE 1 TABLET BY MOUTH EVERY DAY WITH BREAKFAST   nitrofurantoin 100 MG capsule Commonly known as: MACRODANTIN Take 100 mg by mouth every morning.   omeprazole 20 MG capsule Commonly known as: PRILOSEC Take 20 mg by mouth every morning.   ondansetron 8 MG tablet Commonly known as: ZOFRAN TAKE 1 TABLET (8 MG TOTAL) BY MOUTH EVERY 8 (EIGHT) HOURS AS NEEDED FOR NAUSEA OR VOMITING.   OXYGEN Inhale 3 L into the lungs continuous.   Pataday 0.2 % Soln Generic drug: Olopatadine HCl Place 1 drop into both eyes daily.   PreserVision AREDS 2 Caps Take 1 capsule by mouth 2 (two) times daily.   rosuvastatin 10 MG tablet Commonly known as: CRESTOR TAKE 1 TABLET BY MOUTH EVERYDAY AT BEDTIME   spironolactone 25 MG tablet Commonly known as: ALDACTONE Take 25 mg by mouth every morning.   sulfamethoxazole-trimethoprim 800-160 MG tablet Commonly known as: Bactrim DS Take 1 tablet by mouth  2 (two) times daily.   Systane 0.4-0.3 % Gel ophthalmic gel Generic drug: Polyethyl Glycol-Propyl Glycol Place 1 application into both eyes at bedtime as needed (dry eyes).   traMADol 50 MG tablet Commonly known as: ULTRAM Take 50 mg by mouth.   vitamin C 500 MG tablet Commonly known as: ASCORBIC ACID Take 1,000 mg by mouth daily. AM   Vitamin D (Ergocalciferol) 1.25 MG (50000 UT) Caps capsule Commonly known as: DRISDOL TAKE 1 CAPSULE (50,000 UNITS TOTAL) BY MOUTH EVERY 7 (SEVEN) DAYS.   vitamin E 400 UNIT capsule Take 400 Units by mouth daily. AM       History (reviewed): Past Medical History:  Diagnosis Date  . Acute diastolic CHF (congestive heart failure) ([place]) 11/11/2016  . Allergic rhinitis   . Anxiety   . Anxiety   . Arthritis    "~ all my joints" (05/18/2017)  . Atrial fibrillation ([place]) 09/2016  . Atrial fibrillation with RVR ([place]) 05/18/2017  . Chronic lower back pain   . COPD (chronic obstructive pulmonary disease) ([place])   . Depression   .  Diverticulosis   . Dyspnea   . Dysrhythmia   . GERD (gastroesophageal reflux disease)   . Gout   . 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 Hyperlipidemia   . Hypertension   . Hypothyroidism   . Macular degeneration of both eyes   . Neuropathy   . On home oxygen therapy    "3L; 24/7" (05/18/2017)  . Pre-diabetes   . 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 Pulmonary fibrosis ([place])   . Pulmonary fibrosis ([place])   . Rheumatoid arthritis ([place])   . Sleep apnea    uses 3 liters of o2 24/7  . Urticaria    Past Surgical History:  Procedure Laterality Date  . AV NODE ABLATION  05/18/2017  . AV NODE ABLATION N/A 05/18/2017   Procedure: AV NODE ABLATION;  Surgeon: Evans Lance, MD;  Location: [facility];  Service: Cardiovascular;  Laterality: N/A;  . BACK SURGERY    . BREAST LUMPECTOMY Left   . CARDIOVERSION N/A 01/30/2017   Procedure: CARDIOVERSION;  Surgeon:  Josue Hector, MD;  [facility];  Service: Cardiovascular;  Laterality: N/A;  . CARDIOVERSION N/A 04/09/2017   Procedure: CARDIOVERSION;  Surgeon: Satira Sark, MD;  [facility]: [facility];  Service: Cardiovascular;  Laterality: N/A;  . CATARACT EXTRACTION W/ INTRAOCULAR LENS  IMPLANT, BILATERAL Bilateral   . DILATION AND CURETTAGE OF UTERUS    . EUS N/A 10/03/2015   Procedure: UPPER ENDOSCOPIC ULTRASOUND (EUS) RADIAL;  Surgeon: Arta Silence, MD;  Location: [facility];  Service: Endoscopy;  Laterality: N/A;  . FINE NEEDLE ASPIRATION N/A 10/03/2015   Procedure: FINE NEEDLE ASPIRATION (FNA) RADIAL;  Surgeon: Arta Silence, MD;  Location: [facility];  Service: Endoscopy;  Laterality: N/A;  . INSERT / REPLACE / REMOVE PACEMAKER  05/18/2017  . LUMBAR LAMINECTOMY  1995; 06/23/2008   L4-5/notes 08/20/2010  . [place]   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  . PACEMAKER IMPLANT N/A 05/18/2017   Procedure: PACEMAKER IMPLANT;  Surgeon: Evans Lance, MD;  Location: [facility];  Service: Cardiovascular;  Laterality: N/A;  . TOTAL ABDOMINAL HYSTERECTOMY    . TUBAL LIGATION     Family History  Problem Relation Age of Onset  . Diabetes Other   . Hypertension Other   . Coronary artery disease Other   . Stroke Other   . Asthma Other   . Kidney disease Sister   . Cancer Sister        lung  . Lung cancer Sister   . Cancer Sister        lung  . Lung cancer Sister   . Cancer Sister        lung  . Suicidality Son        committed suicide in 2009  . CVA Son   . Hyperlipidemia Other   . Anxiety disorder Other   . Migraines Other   . Heart failure Father   . Healthy Brother   . Cancer Sister        lung  . Healthy Sister    Social History   Socioeconomic History  . Marital status: Married    Spouse name: Mortimer Fries  . Number of children: 4  . Years of education: Not on file  . Highest education level: 9th grade  Occupational History  .  Occupation: retired    Comment: cna  Social Needs  .  Financial resource strain: Not hard at all  . Food insecurity    Worry: Never true    Inability: Never true  . Transportation needs    Medical: No    Non-medical: No  Tobacco Use  . Smoking status: Never Smoker  . Smokeless tobacco: Never Used  Substance and Sexual Activity  . Alcohol use: No  . Drug use: No  . Sexual activity: Not Currently    Birth control/protection: Surgical  Lifestyle  . Physical activity    Days per week: 0 days    Minutes per session: 0 min  . Stress: Rather much  Relationships  . Social connections    Talks on phone: More than three times a week    Gets together: More than three times a week    Attends religious service: Never    Active member of club or organization: No    Attends meetings of clubs or organizations: Never    Relationship status: Married  Other Topics Concern  . 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   Pt lives with her husband in a single wide trailer. They have 4 children and 13 grandchildren.     Activities of Daily Living In your present state of health, do you have any difficulty performing the following activities: 12/01/2018 08/12/2018  Hearing? N N  Vision? Y Y  Comment - macular degeneration  Difficulty concentrating or making decisions? Y N  Walking or climbing stairs? Y Y  Dressing or bathing? Y Y  Doing errands, shopping? Tempie Donning  Preparing Food and eating ? N Y  Comment - husband prepares meals. She feeds herself  Using the Toilet? N N  In the past six months, have you accidently leaked urine? Y Y  Do you have problems with loss of bowel control? N N  Comment - episodes of constipation  Managing your Medications? N Y  Comment - granddaughter organizes in weekly pill box  Managing your Finances? N N  Housekeeping or managing your Housekeeping? Y Y  Comment - husband does most housework.  Granddaughter will help some  Some recent data might be hidden    Patient Education/ Literacy How often do you need to have someone help you when you read instructions, pamphlets, or other written materials from your doctor or pharmacy?: 1 - Never What is the last grade level you completed in school?: 9th Grade  Exercise Current Exercise Habits: The patient does not participate in regular exercise at present, Exercise limited by: orthopedic condition(s);neurologic condition(s)  Diet Patient reports consuming 3 meals a day and 2 snack(s) a day Patient reports that her primary diet is: Regular Patient reports that she does have regular access to food.   Depression Screen PHQ 2/9 Scores 12/01/2018 10/28/2018 10/28/2018 07/01/2018 06/11/2018 05/25/2018 04/08/2018  PHQ - 2 Score 2 2 0 4 5 0 0  PHQ- 9 Score 8 8 - 14 10 - -     Fall Risk Fall Risk  12/01/2018 10/28/2018 06/11/2018 05/25/2018 04/08/2018  Falls in the past year? 1 0 1 1 0  Number falls in past yr: 1 - 0 0 -  Injury with Fall? 0 - 0 0 -  Comment - - - - -  Risk for fall due to : Impaired balance/gait;Impaired mobility;History of fall(s) - - - -     Objective:  Nani Gasser Burrows seemed alert and oriented and  she participated appropriately during our telephone visit.  Blood Pressure Weight BMI  BP Readings from Last 3 Encounters:  10/28/18 134/73  08/26/18 134/77  07/29/18 122/68   Wt Readings from Last 3 Encounters:  12/01/18 233 lb 7.5 oz (105.9 kg)  10/28/18 233 lb 6.4 oz (105.9 kg)  08/26/18 246 lb 8 oz (111.8 kg)   BMI Readings from Last 1 Encounters:  12/01/18 35.50 kg/m    *Unable to obtain current vital signs, weight, and BMI due to telephone visit type  Hearing/Vision  . Luellen did not seem to have difficulty with hearing/understanding during the telephone conversation . Reports that she has had a formal eye exam by an eye care professional within the past year . Reports that she has not had a formal hearing evaluation  within the past year *Unable to fully assess hearing and vision during telephone visit type  Cognitive Function: 6CIT Screen 12/01/2018  What Year? 0 points  What month? 0 points  What time? 0 points  Count back from 20 2 points  Months in reverse 2 points  Repeat phrase 4 points  Total Score 8   (Normal:0-7, Significant for Dysfunction: >8)  Normal Cognitive Function Screening: No: Some memory loss noted    Immunization & Health Maintenance Record Immunization History  Administered Date(s) Administered  . Influenza Split 01/20/2011, 12/22/2011  . Influenza, High Dose Seasonal PF 01/25/2014, 01/24/2015, 01/01/2016, 02/26/2018  . Influenza-Unspecified 01/19/2013  . Pneumococcal Conjugate-13 03/01/2015  . Pneumococcal Polysaccharide-23 04/21/2009  . Zoster 10/20/2010  . Zoster Recombinat (Shingrix) 09/03/2016, 06/18/2017, 12/15/2017    Health Maintenance  Topic Date Due  . Samul Dada  07/10/1959  . OPHTHALMOLOGY EXAM  06/25/2018  . INFLUENZA VACCINE  11/20/2018  . HEMOGLOBIN A1C  04/30/2019  . FOOT EXAM  10/28/2019  . URINE MICROALBUMIN  10/28/2019  . DEXA SCAN  Completed  . PNA vac Low Risk Adult  Completed       Assessment  This is a routine wellness examination for NAILYN DEARINGER.  Health Maintenance: Due or Overdue Health Maintenance Due  Topic Date Due  . TETANUS/TDAP  07/10/1959  . OPHTHALMOLOGY EXAM  06/25/2018  . INFLUENZA VACCINE  11/20/2018    [name] needs a referral for Community Assistance: Care Management:   no Social Work:    no Prescription Assistance:  no Nutrition/Diabetes Education:  no   Plan:  Personalized Goals Goals Addressed   None    Personalized Health Maintenance & Screening Recommendations  Td vaccine  Lung Cancer Screening Recommended: no (Low Dose CT Chest recommended if Age 52-80 years, 30 pack-year currently smoking OR have quit w/in past 15 years) Hepatitis C Screening recommended: no HIV Screening  recommended: no  Advanced Directives: Written information was prepared per patient's request.  Referrals & Orders No orders of the defined types were placed in this encounter.   Follow-up Plan . Follow-up with Sharion Balloon, FNP as planned    I have personally reviewed and noted the following in the patient's chart:   . Medical and social history . Use of alcohol, tobacco or illicit drugs  . Current medications and supplements . Functional ability and status . Nutritional status . Physical activity . Advanced directives . List of other physicians . Hospitalizations, surgeries, and ER visits in previous 12 months . Vitals . Screenings to include cognitive, depression, and falls . Referrals and appointments  In addition, I have reviewed and discussed with [name] certain preventive protocols, quality metrics,  and best practice recommendations. A written personalized care plan for preventive services as well as general preventive health recommendations is available and can be mailed to the patient at her request.      Sharilyn Sites  [id number]  I have reviewed and agree with the above AWV documentation Caryl Pina, MD [place] 12/02/2018, 8:37 AM

## 2018-12-01 NOTE — Patient Instructions (Signed)
Ms. Celestin , Thank you for taking time to come for your Medicare Wellness Visit. I appreciate your ongoing commitment to your health goals. Please review the following plan we discussed and let me know if I can assist you in the future.   These are the goals we discussed: Goals    . "I want to stop getting bladder infections" (pt-stated)    . Exercise 150 min/wk Moderate Activity     Chair exercises daily. Handout given.     . Patient Stated (pt-stated)     Current Barriers:  Marland Kitchen Knowledge Deficits related to how to obtain a lift chair.  Nurse Case Manager Clinical Goal(s):  Marland Kitchen Over the next 14 days, patient will verbalize understanding of plan for obtaining a lift chair through [employer].  Interventions:  . Planned collaboration with [employer] regarding cost of lift chair . Planned collaboration with insurance company regarding coverage of lift chair . Discussed plans with patient for ongoing care management follow up and provided patient with direct contact information for care management team  Patient Self Care Activities:  . Calls pharmacy for medication refills . Calls provider office for new concerns or questions  Initial goal documentation         This is a list of the screening recommended for you and due dates:  Health Maintenance  Topic Date Due  . Tetanus Vaccine  [birth year]10/1959  . Eye exam for diabetics  06/25/2018  . Flu Shot  11/20/2018  . Hemoglobin A1C  04/30/2019  . Complete foot exam   10/28/2019  . Urine Protein Check  10/28/2019  . DEXA scan (bone density measurement)  Completed  . Pneumonia vaccines  Completed    Advance Directive  Advance directives are legal documents that let you make choices ahead of time about your health care and medical treatment in case you become unable to communicate for yourself. Advance directives are a way for you to communicate your wishes to family, friends, and health care providers. This can help  convey your decisions about end-of-life care if you become unable to communicate. Discussing and writing advance directives should happen over time rather than all at once. Advance directives can be changed depending on your situation and what you want, even after you have signed the advance directives. If you do not have an advance directive, some states assign family decision makers to act on your behalf based on how closely you are related to them. Each state has its own laws regarding advance directives. You may want to check with your health care provider, attorney, or state representative about the laws in your state. There are different types of advance directives, such as:  Medical power of attorney.  Living will.  Do not resuscitate (DNR) or do not attempt resuscitation (DNAR) order. Health care proxy and medical power of attorney A health care proxy, also called a health care agent, is a person who is appointed to make medical decisions for you in cases in which you are unable to make the decisions yourself. Generally, people choose someone they know well and trust to represent their preferences. Make sure to ask this person for an agreement to act as your proxy. A proxy may have to exercise judgment in the event of a medical decision for which your wishes are not known. A medical power of attorney is a legal document that names your health care proxy. Depending on the laws in your state, after the document is written, it may  also need to be:  Signed.  Notarized.  Dated.  Copied.  Witnessed.  Incorporated into your medical record. You may also want to appoint someone to manage your financial affairs in a situation in which you are unable to do so. This is called a durable power of attorney for finances. It is a separate legal document from the durable power of attorney for health care. You may choose the same person or someone different from your health care proxy to act as your  agent in financial matters. If you do not appoint a proxy, or if there is a concern that the proxy is not acting in your best interests, a court-appointed guardian may be designated to act on your behalf. Living will A living will is a set of instructions documenting your wishes about medical care when you cannot express them yourself. Health care providers should keep a copy of your living will in your medical record. You may want to give a copy to family members or friends. To alert caregivers in case of an emergency, you can place a card in your wallet to let them know that you have a living will and where they can find it. A living will is used if you become:  Terminally ill.  Incapacitated.  Unable to communicate or make decisions. Items to consider in your living will include:  The use or non-use of life-sustaining equipment, such as dialysis machines and breathing machines (ventilators).  A DNR or DNAR order, which is the instruction not to use cardiopulmonary resuscitation (CPR) if breathing or heartbeat stops.  The use or non-use of tube feeding.  Withholding of food and fluids.  Comfort (palliative) care when the goal becomes comfort rather than a cure.  Organ and tissue donation. A living will does not give instructions for distributing your money and property if you should pass away. It is recommended that you seek the advice of a lawyer when writing a will. Decisions about taxes, beneficiaries, and asset distribution will be legally binding. This process can relieve your family and friends of any concerns surrounding disputes or questions that may come up about the distribution of your assets. DNR or DNAR A DNR or DNAR order is a request not to have CPR in the event that your heart stops beating or you stop breathing. If a DNR or DNAR order has not been made and shared, a health care provider will try to help any patient whose heart has stopped or who has stopped breathing. If  you plan to have surgery, talk with your health care provider about how your DNR or DNAR order will be followed if problems occur. Summary  Advance directives are the legal documents that allow you to make choices ahead of time about your health care and medical treatment in case you become unable to communicate for yourself.  The process of discussing and writing advance directives should happen over time. You can change the advance directives, even after you have signed them.  Advance directives include DNR or DNAR orders, living wills, and designating an agent as your medical power of attorney. This information is not intended to replace advice given to you by your health care provider. Make sure you discuss any questions you have with your health care provider. Document Released: 07/15/2007 Document Revised: 05/12/2018 Document Reviewed: 02/25/2016 Elsevier Patient Education  2020 [place].   BMI for Adults  Body mass index (BMI) is a number that is calculated from a person's weight and  height. BMI may help to estimate how much of a person's weight is composed of fat. BMI can help identify those who may be at higher risk for certain medical problems. How is BMI used with adults? BMI is used as a screening tool to identify possible weight problems. It is used to check whether a person is obese, overweight, healthy weight, or underweight. How is BMI calculated? BMI measures your weight and compares it to your height. This can be done either in [place] ([place] or metric measurements. Note that charts are available to help you find your BMI quickly and easily without having to do these calculations yourself. To calculate your BMI in [place] ([place] measurements, your health care provider will: 1. Measure your weight in pounds (lb). 2. Multiply the number of pounds by 703. ? For example, for a person who weighs 180 lb, multiply that number by 703, which equals 126,540. 3. Measure your  height in inches (in). Then multiply that number by itself to get a measurement called "inches squared." ? For example, for a person who is 70 in tall, the "inches squared" measurement is 70 in x 70 in, which equals 4900 inches squared. 4. Divide the total from Step 2 (number of lb x 703) by the total from Step 3 (inches squared): 126,540  4900 = 25.8. This is your BMI. To calculate your BMI in metric measurements, your health care provider will: 1. Measure your weight in kilograms (kg). 2. Measure your height in meters (m). Then multiply that number by itself to get a measurement called "meters squared." ? For example, for a person who is 1.75 m tall, the "meters squared" measurement is 1.75 m x 1.75 m, which is equal to 3.1 meters squared. 3. Divide the number of kilograms (your weight) by the meters squared number. In this example: 70  3.1 = 22.6. This is your BMI. How is BMI interpreted? To interpret your results, your health care provider will use BMI charts to identify whether you are underweight, normal weight, overweight, or obese. The following guidelines will be used:  Underweight: BMI less than 18.5.  Normal weight: BMI between 18.5 and 24.9.  Overweight: BMI between 25 and 29.9.  Obese: BMI of 30 and above. Please note:  Weight includes both fat and muscle, so someone with a muscular build, such as an athlete, may have a BMI that is higher than 24.9. In cases like these, BMI is not an accurate measure of body fat.  To determine if excess body fat is the cause of a BMI of 25 or higher, further assessments may need to be done by a health care provider.  BMI is usually interpreted in the same way for men and women. Why is BMI a useful tool? BMI is useful in two ways:  Identifying a weight problem that may be related to a medical condition, or that may increase the risk for medical problems.  Promoting lifestyle and diet changes in order to reach a healthy weight. Summary   Body mass index (BMI) is a number that is calculated from a person's weight and height.  BMI may help to estimate how much of a person's weight is composed of fat. BMI can help identify those who may be at higher risk for certain medical problems.  BMI can be measured using English measurements or metric measurements.  To interpret your results, your health care provider will use BMI charts to identify whether you are underweight, normal weight, overweight, or obese.  This information is not intended to replace advice given to you by your health care provider. Make sure you discuss any questions you have with your health care provider. Document Released: 12/18/2003 Document Revised: 03/20/2017 Document Reviewed: 02/18/2017 Elsevier Patient Education  2020 [employer].

## 2018-12-07 ENCOUNTER — Ambulatory Visit (INDEPENDENT_AMBULATORY_CARE_PROVIDER_SITE_OTHER): Payer: Medicare Other | Admitting: Emergency Medicine

## 2018-12-07 ENCOUNTER — Encounter: Payer: Self-pay | Admitting: Emergency Medicine

## 2018-12-07 ENCOUNTER — Other Ambulatory Visit: Payer: Self-pay

## 2018-12-07 ENCOUNTER — Ambulatory Visit (INDEPENDENT_AMBULATORY_CARE_PROVIDER_SITE_OTHER): Payer: Medicare Other | Admitting: *Deleted

## 2018-12-07 DIAGNOSIS — J8410 Pulmonary fibrosis, unspecified: Secondary | ICD-10-CM | POA: Diagnosis not present

## 2018-12-07 DIAGNOSIS — I4891 Unspecified atrial fibrillation: Secondary | ICD-10-CM | POA: Diagnosis not present

## 2018-12-07 DIAGNOSIS — J449 Chronic obstructive pulmonary disease, unspecified: Secondary | ICD-10-CM | POA: Diagnosis not present

## 2018-12-07 NOTE — Progress Notes (Signed)
Subjective:    Patient ID: Lorraine Leonard, female    DOB: May 14, 1940, 78 y.o.   MRN: [id number]  [place] 11/11/17 --78 year old woman with interstitial lung disease and hypoxemic respiratory failure, possible superimposed obstruction on pulmonary function testing.  She is been managed with Incruse for this and believes that she has benefited.  She is on prednisone 10 mg daily for presumed/possible rheumatoid arthritis although this diagnosis is not ever been entirely clear.  She is using oxygen at 3 L/min pulsed.  Sleep apnea has been suspected but she has not wanted to have a sleep study to confirm it. She gets SOB with minimal exertion, with even short walks. She uses nebulized albuterol most days, not all.    ROV 04/07/18 --78 year old woman with a questionable history of rheumatoid arthritis (question how this was officially diagnosed) who follows up today for interstitial lung disease with associated hypoxemic respiratory failure.  She also has possible superimposed obstruction based on her pulmonary function testing this been managed with Incruse.  We repeated her CT scan of the chest on 03/24/2018 and I have reviewed.  This shows no interval progression compared with 1 year ago 03/23/2017.  This been very slight progression compared with scans going all the way back to 2008. Her overall functional capacity is down. She has more dyspnea w exertion. She has foot and leg pain. Remains on pred 10mg  daily. Her metoprolol was stopped a month ago, started digoxin.  She believes that she benefits from Incruse, uses nebs prn.   ROV 12/07/2018 --follow-up visit for 78 year old woman with interstitial lung disease and associated hypoxemic respiratory failure, possible coexisting obstructive disease on spirometry, improved on Incruse.  Currently managed on oxygen at 3 L/min.  Most recent CT chest 03/29/2013 as above.  She has rheumatoid arthritis, and has been on prednisone although she is confused about dosing.  It sounds  like she may have been given a burst of prednisone for 14 days recently.  Since then she has not been on any maintenance therapy.  Not clear to me whether this was Dr. Trudie Reed' intention.  With regard to her breathing she is overall stable.  She uses Incruse on a schedule, rare albuterol use.  No exacerbations   No evidence obstruction on spirometry.  PULMONARY FUNCTON TEST 02/03/2012  FVC 2.91  FEV1 2.1  FEV1/FVC 72.2  FVC  % Predicted 89  FEV % Predicted 90  FeF 25-75 1.45  FeF 25-75 % Predicted 2.5        Objective:   Physical Exam Vitals:   12/07/18 1332  BP: 114/80  Pulse: 92  SpO2: 99%  Weight: 228 lb (103.4 kg)  Height: 5\' 8"  (1.727 m)   Gen: Pleasant, obese woman, in no distress,  normal affect, wheelchair  ENT: No lesions,  mouth clear,  oropharynx clear, no postnasal drip  Neck: No JVD, no stridor  Lungs: No use of accessory muscles, few insp crackles B bases  Cardiovascular: RRR, heart sounds normal, no murmur or gallops, no peripheral edema  Musculoskeletal: No deformities, no cyanosis or clubbing  Neuro: alert, non focal, she is confused about her meds  Skin: Warm, no lesions or rashes       Assessment & Plan:  COPD (chronic obstructive pulmonary disease) (HCC) Please continue Incruse once daily. Please keep albuterol available to use if needed for shortness of breath, chest tightness, wheezing. Follow with Dr Lamonte Sakai in 6 months or sooner if you have any problems  Pulmonary fibrosis ([place]) In  the setting of rheumatoid disease.  I was planning to defer any further scheduled imaging but that was before there was any information to suggest that she is going to be changing her immunosuppressive regimen.  She is not on maintenance prednisone but is not clear to me that this was what Dr. Trudie Reed intended.  The patient has difficulty giving this history.  I will have her follow-up with rheumatology to clarify  We will not repeat your CT scan of the chest at  this time.  We will follow you clinically, consider repeating a chest x-ray intermittently going forward I believe you need to follow-up with Dr. Trudie Reed with rheumatology to clarify which dose of prednisone you should be taking (if any).   Baltazar Apo, MD, PhD 12/07/2018, 1:57 PM [facility] [phone] or if no answer [phone]

## 2018-12-07 NOTE — Assessment & Plan Note (Signed)
In the setting of rheumatoid disease.  I was planning to defer any further scheduled imaging but that was before there was any information to suggest that she is going to be changing her immunosuppressive regimen.  She is not on maintenance prednisone but is not clear to me that this was what Dr. Trudie Reed intended.  The patient has difficulty giving this history.  I will have her follow-up with rheumatology to clarify  We will not repeat your CT scan of the chest at this time.  We will follow you clinically, consider repeating a chest x-ray intermittently going forward I believe you need to follow-up with Dr. Trudie Reed with rheumatology to clarify which dose of prednisone you should be taking (if any).

## 2018-12-07 NOTE — Assessment & Plan Note (Signed)
Please continue Incruse once daily. Please keep albuterol available to use if needed for shortness of breath, chest tightness, wheezing. Follow with Dr Lamonte Sakai in 6 months or sooner if you have any problems

## 2018-12-07 NOTE — Patient Instructions (Signed)
Please continue Incruse once daily. Please keep albuterol available to use if needed for shortness of breath, chest tightness, wheezing. We will not repeat your CT scan of the chest at this time.  We will follow you clinically, consider repeating a chest x-ray intermittently going forward I believe you need to follow-up with Dr. Trudie Reed with rheumatology to clarify which dose of prednisone you should be taking (if any). Follow with Dr Lamonte Sakai in 6 months or sooner if you have any problems

## 2018-12-08 ENCOUNTER — Encounter: Payer: Self-pay | Admitting: Internal Medicine

## 2018-12-08 ENCOUNTER — Other Ambulatory Visit: Payer: Self-pay | Admitting: Family

## 2018-12-08 ENCOUNTER — Ambulatory Visit (INDEPENDENT_AMBULATORY_CARE_PROVIDER_SITE_OTHER): Payer: Medicare Other | Admitting: Internal Medicine

## 2018-12-08 VITALS — BP 148/78 | HR 92 | Temp 97.1°F | Wt 227.0 lb

## 2018-12-08 DIAGNOSIS — I4821 Permanent atrial fibrillation: Secondary | ICD-10-CM | POA: Diagnosis not present

## 2018-12-08 DIAGNOSIS — Z950 Presence of cardiac pacemaker: Secondary | ICD-10-CM

## 2018-12-08 DIAGNOSIS — R001 Bradycardia, unspecified: Secondary | ICD-10-CM | POA: Diagnosis not present

## 2018-12-08 LAB — CUP PACEART REMOTE DEVICE CHECK
Date Time Interrogation Session: 20200819083604
Implantable Lead Implant Date: 20190128
Implantable Lead Implant Date: 20190128
Implantable Lead Location: 753860
Implantable Lead Location: 753860
Implantable Lead Model: 3830
Implantable Lead Model: 5076
Implantable Pulse Generator Implant Date: 20190128

## 2018-12-08 MED ORDER — ATENOLOL 50 MG PO TABS: 50.0000 mg | ORAL_TABLET | Freq: Every day | ORAL | 3 refills | Status: DC

## 2018-12-08 NOTE — Progress Notes (Signed)
HPI Lorraine Leonard returns today for follow-up of atrial fib and chronic systolic heart failure, chronic lung disease, morbid obesity. She has lost over 20 lbs since I saw her last. She feels better but does note that she feels like her heart races when she does something strenuous. She had undergone AV node ablation previously but we were limited in the amount of RF by her pacing lead position.  Allergies  Allergen Reactions  . Allopurinol Swelling    Feet and legs swell  . Mobic [Meloxicam] Swelling and Other (See Comments)    Feet and Leg swelling     Current Outpatient Medications  Medication Sig Dispense Refill  . acetaminophen (TYLENOL) 500 MG tablet Take 500 mg by mouth every 6 (six) hours as needed (pain).    Marland Kitchen albuterol (PROVENTIL) (2.5 MG/3ML) 0.083% nebulizer solution Take 3 mLs (2.5 mg total) by nebulization every 6 (six) hours as needed for wheezing or shortness of breath. J96.01 150 mL 1  . azelastine (ASTELIN) 0.1 % nasal spray Place 2 sprays into both nostrils 2 (two) times daily. Use in each nostril as directed 30 mL 12  . calcium citrate (CALCITRATE - DOSED IN MG ELEMENTAL CALCIUM) 950 MG tablet Take 1 tablet by mouth 2 (two) times daily.     . cyanocobalamin 1000 MCG tablet Take 1,000 mcg by mouth daily. Vitamin b12 every AM    . diazepam (VALIUM) 5 MG tablet TAKE 1 TABLET BY MOUTH TWICE A DAY AS NEEDED FOR ANXIETY 60 tablet 2  . diclofenac sodium (VOLTAREN) 1 % GEL Apply 2 g topically 4 (four) times daily. (Patient taking differently: Apply 2 g topically 4 (four) times daily. AS NEEDED) 100 g 3  . digoxin (LANOXIN) 0.125 MG tablet Take 0.125 mg by mouth every morning.    . docusate sodium (COLACE) 100 MG capsule Take 200 mg by mouth daily as needed for mild constipation.    Marland Kitchen ELIQUIS 5 MG TABS tablet TAKE 1 TABLET BY MOUTH TWICE A DAY 180 tablet 1  . EPINEPHrine 0.3 mg/0.3 mL IJ SOAJ injection Inject 0.3 mg into the muscle as needed for anaphylaxis.    Marland Kitchen ESTRACE  VAGINAL 0.1 MG/GM vaginal cream PLACE 5.39 APPLICATORFUL VAGINALLY WEEKLY AS NEEDED AS DIRECTED 42.5 g 1  . famotidine (PEPCID) 20 MG tablet Take 1 tablet (20 mg total) by mouth 2 (two) times daily for 14 days. 28 tablet 0  . fluticasone (FLONASE) 50 MCG/ACT nasal spray PLACE 1 SPRAY INTO BOTH NOSTRILS 2 (TWO) TIMES DAILY AS NEEDED FOR ALLERGIES OR RHINITIS. (Patient taking differently: Place 1 spray into both nostrils daily. ) 48 g 5  . furosemide (LASIX) 40 MG tablet Take 2 tablets (80 mg total) by mouth daily. 180 tablet 3  . levocetirizine (XYZAL) 5 MG tablet TAKE 1 TABLET BY MOUTH EVERY DAY IN THE EVENING 90 tablet 1  . levothyroxine (SYNTHROID) 125 MCG tablet Take 1 tablet (125 mcg total) by mouth daily. 90 tablet 1  . metFORMIN (GLUCOPHAGE-XR) 500 MG 24 hr tablet TAKE 1 TABLET BY MOUTH EVERY DAY WITH BREAKFAST 90 tablet 0  . Multiple Vitamins-Minerals (PRESERVISION AREDS 2) CAPS Take 1 capsule by mouth 2 (two) times daily.    . nitrofurantoin (MACRODANTIN) 100 MG capsule Take 100 mg by mouth every morning.    . Olopatadine HCl (PATADAY) 0.2 % SOLN Place 1 drop into both eyes daily.    Marland Kitchen omeprazole (PRILOSEC) 20 MG capsule Take 20 mg by mouth  every morning.    . ondansetron (ZOFRAN) 8 MG tablet TAKE 1 TABLET (8 MG TOTAL) BY MOUTH EVERY 8 (EIGHT) HOURS AS NEEDED FOR NAUSEA OR VOMITING. 20 tablet 2  . OXYGEN Inhale 3 L into the lungs continuous.    Vladimir Faster Glycol-Propyl Glycol (SYSTANE) 0.4-0.3 % GEL ophthalmic gel Place 1 application into both eyes at bedtime as needed (dry eyes).    . rosuvastatin (CRESTOR) 10 MG tablet TAKE 1 TABLET BY MOUTH EVERYDAY AT BEDTIME 90 tablet 1  . spironolactone (ALDACTONE) 25 MG tablet Take 25 mg by mouth every morning.    . traMADol (ULTRAM) 50 MG tablet Take 50 mg by mouth.    . umeclidinium bromide (INCRUSE ELLIPTA) 62.5 MCG/INH AEPB Inhale 1 puff into the lungs daily. As needed 1 each 0  . vitamin C (ASCORBIC ACID) 500 MG tablet Take 1,000 mg by mouth  daily. AM    . Vitamin D, Ergocalciferol, (DRISDOL) 1.25 MG (50000 UT) CAPS capsule TAKE 1 CAPSULE (50,000 UNITS TOTAL) BY MOUTH EVERY 7 (SEVEN) DAYS. 12 capsule 1  . vitamin E 400 UNIT capsule Take 400 Units by mouth daily. AM     No current facility-administered medications for this visit.      Past Medical History:  Diagnosis Date  . Acute diastolic CHF (congestive heart failure) ([place]) 11/11/2016  . Allergic rhinitis   . Anxiety   . Anxiety   . Arthritis    "~ all my joints" (05/18/2017)  . Atrial fibrillation ([place]) 09/2016  . Atrial fibrillation with RVR ([place]) 05/18/2017  . Chronic lower back pain   . COPD (chronic obstructive pulmonary disease) ([place])   . Depression   . Diverticulosis   . Dyspnea   . Dysrhythmia   . GERD (gastroesophageal reflux disease)   . Gout   . 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 Hyperlipidemia   . Hypertension   . Hypothyroidism   . Macular degeneration of both eyes   . Neuropathy   . On home oxygen therapy    "3L; 24/7" (05/18/2017)  . Pre-diabetes   . 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 Pulmonary fibrosis ([place])   . Pulmonary fibrosis ([place])   . Rheumatoid arthritis ([place])   . Sleep apnea    uses 3 liters of o2 24/7  . Urticaria     ROS:   All systems reviewed and negative except as noted in the HPI.   Past Surgical History:  Procedure Laterality Date  . AV NODE ABLATION  05/18/2017  . AV NODE ABLATION N/A 05/18/2017   Procedure: AV NODE ABLATION;  Surgeon: Evans Lance, MD;  Location: [facility];  Service: Cardiovascular;  Laterality: N/A;  . BACK SURGERY    . BREAST LUMPECTOMY Left   . CARDIOVERSION N/A 01/30/2017   Procedure: CARDIOVERSION;  Surgeon: Josue Hector, MD;  [facility];  Service: Cardiovascular;  Laterality: N/A;  . CARDIOVERSION N/A 04/09/2017   Procedure: CARDIOVERSION;  Surgeon: Satira Sark, MD;  [facility];   Service: Cardiovascular;  Laterality: N/A;  . CATARACT EXTRACTION W/ INTRAOCULAR LENS  IMPLANT, BILATERAL Bilateral   . DILATION AND CURETTAGE OF UTERUS    . EUS N/A 10/03/2015   Procedure: UPPER ENDOSCOPIC ULTRASOUND (EUS) RADIAL;  Surgeon: Arta Silence, MD;  Location: [facility];  Service: Endoscopy;  Laterality: N/A;  . FINE NEEDLE ASPIRATION N/A 10/03/2015   Procedure: FINE  NEEDLE ASPIRATION (FNA) RADIAL;  Surgeon: Arta Silence, MD;  Location: [facility];  Service: Endoscopy;  Laterality: N/A;  . INSERT / REPLACE / REMOVE PACEMAKER  05/18/2017  . LUMBAR LAMINECTOMY  1995; 06/23/2008   L4-5/notes 08/20/2010  . [place]   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  . PACEMAKER IMPLANT N/A 05/18/2017   Procedure: PACEMAKER IMPLANT;  Surgeon: Evans Lance, MD;  Location: [facility];  Service: Cardiovascular;  Laterality: N/A;  . TOTAL ABDOMINAL HYSTERECTOMY    . TUBAL LIGATION       Family History  Problem Relation Age of Onset  . Diabetes Other   . Hypertension Other   . Coronary artery disease Other   . Stroke Other   . Asthma Other   . Kidney disease Sister   . Cancer Sister        lung  . Lung cancer Sister   . Cancer Sister        lung  . Lung cancer Sister   . Cancer Sister        lung  . Suicidality Son        committed suicide in 2009  . CVA Son   . Hyperlipidemia Other   . Anxiety disorder Other   . Migraines Other   . Heart failure Father   . Healthy Brother   . Cancer Sister        lung  . Healthy Sister      Social History   Socioeconomic History  . Marital status: Married    Spouse name: Lorraine Leonard  . Number of children: 4  . Years of education: Not on file  . Highest education level: 9th grade  Occupational History  . Occupation: retired    Comment: cna  Social Needs  . Financial resource strain: Not hard at all  . Food insecurity    Worry: Never true    Inability: Never true  . Transportation needs    Medical: No     Non-medical: No  Tobacco Use  . Smoking status: Never Smoker  . Smokeless tobacco: Never Used  Substance and Sexual Activity  . Alcohol use: No  . Drug use: No  . Sexual activity: Not Currently    Birth control/protection: Surgical  Lifestyle  . Physical activity    Days per week: 0 days    Minutes per session: 0 min  . Stress: Rather much  Relationships  . Social connections    Talks on phone: More than three times a week    Gets together: More than three times a week    Attends religious service: Never    Active member of club or organization: No    Attends meetings of clubs or organizations: Never    Relationship status: Married  . Intimate partner violence    Fear of current or ex partner: No    Emotionally abused: No    Physically abused: No    Forced sexual activity: No  Other Topics Concern  . 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   Pt lives with her husband in a single wide trailer. They have 4 children and 13 grandchildren.      BP (!) 148/78 (BP Location: Left Wrist)   Pulse 92   Temp (!) 97.1 F (36.2 C)   Wt 227 lb (103 kg)   SpO2 98%  BMI 34.52 kg/m   Physical Exam:  stable appearing 78 yo woman, NAD HEENT: Unremarkable Neck:  No JVD, no thyromegally Lymphatics:  No adenopathy Back:  No CVA tenderness Lungs:  Clear with no wheezes HEART:  IRegular rate rhythm, no murmurs, no rubs, no clicks Abd:  soft, positive bowel sounds, no organomegally, no rebound, no guarding Ext:  2 plus pulses, no edema, no cyanosis, no clubbing Skin:  No rashes no nodules Neuro:  CN II through XII intact, motor grossly intact  DEVICE  Normal device function.  See PaceArt for details.   Assess/Plan: 1. Atrial fib - her VR is still not well controlled. I have asked her to start atenolol and stop digoxin.  2. HTN - her bp is up a bit today. I have started atenolol. 3. Chronic systolic/diastolic  CHF - she will continue her lasix and aldactone 4. Obesity - she has lost weight. Hopefully she can get under 200 lbs.   Lorraine Leonard.D.

## 2018-12-08 NOTE — Patient Instructions (Addendum)
Medication Instructions:  Your physician has recommended you make the following change in your medication:  Stop Taking Digoxin  Start Taking Atenolol 50 mg Daily   If you need a refill on your cardiac medications before your next appointment, please call your pharmacy.   Lab work: NONE  If you have labs (blood work) drawn today and your tests are completely normal, you will receive your results only by: Marland Kitchen MyChart Message (if you have MyChart) OR . A paper copy in the mail If you have any lab test that is abnormal or we need to change your treatment, we will call you to review the results.  Testing/Procedures: NONE   Follow-Up: At [facility], you and your health needs are our priority.  As part of our continuing mission to provide you with exceptional heart care, we have created designated Provider Care Teams.  These Care Teams include your primary Cardiologist (physician) and Advanced Practice Providers (APPs -  Physician Assistants and Nurse Practitioners) who all work together to provide you with the care you need, when you need it. You will need a follow up appointment in 3 months.  Please call our office 2 months in advance to schedule this appointment.  You may see Cristopher Peru, MD or one of the following Advanced Practice Providers on your designated Care Team:   Chanetta Marshall, NP . Tommye Standard, PA-C  Any Other Special Instructions Will Be Listed Below (If Applicable). Thank you for choosing [facility]!

## 2018-12-09 ENCOUNTER — Other Ambulatory Visit: Payer: Self-pay | Admitting: Family

## 2018-12-09 DIAGNOSIS — L501 Idiopathic urticaria: Secondary | ICD-10-CM

## 2018-12-09 MED ORDER — FAMOTIDINE 20 MG PO TABS: 20.0000 mg | ORAL_TABLET | Freq: Two times a day (BID) | ORAL | 1 refills | Status: DC

## 2018-12-10 LAB — CUP PACEART INCLINIC DEVICE CHECK
Battery Remaining Longevity: 134 mo
Battery Voltage: 2.98 V
Brady Statistic AP VP Percent: 1.22 %
Brady Statistic AP VS Percent: 18.65 %
Brady Statistic AS VP Percent: 0 %
Brady Statistic AS VS Percent: 80.13 %
Brady Statistic RA Percent Paced: 58.01 %
Brady Statistic RV Percent Paced: 1.22 %
Date Time Interrogation Session: 20200819152811
Implantable Lead Implant Date: 20190128
Implantable Lead Implant Date: 20190128
Implantable Lead Location: 753860
Implantable Lead Location: 753860
Implantable Lead Model: 3830
Implantable Lead Model: 5076
Implantable Pulse Generator Implant Date: 20190128
Lead Channel Impedance Value: 285 Ohm
Lead Channel Impedance Value: 342 Ohm
Lead Channel Impedance Value: 380 Ohm
Lead Channel Impedance Value: 380 Ohm
Lead Channel Pacing Threshold Amplitude: 0.5 V
Lead Channel Pacing Threshold Amplitude: 2.25 V
Lead Channel Pacing Threshold Pulse Width: 0.4 ms
Lead Channel Pacing Threshold Pulse Width: 1 ms
Lead Channel Sensing Intrinsic Amplitude: 11.875 mV
Lead Channel Sensing Intrinsic Amplitude: 2.375 mV
Lead Channel Setting Pacing Amplitude: 2.5 V
Lead Channel Setting Pacing Amplitude: 3.5 V
Lead Channel Setting Pacing Pulse Width: 0.4 ms
Lead Channel Setting Sensing Sensitivity: 0.9 mV

## 2018-12-15 ENCOUNTER — Encounter: Payer: Self-pay | Admitting: Cardiology

## 2018-12-15 NOTE — Progress Notes (Signed)
Remote pacemaker transmission.   

## 2018-12-17 ENCOUNTER — Telehealth: Payer: Self-pay | Admitting: Family

## 2018-12-17 ENCOUNTER — Encounter: Payer: Self-pay | Admitting: Physician Assistant

## 2018-12-17 ENCOUNTER — Ambulatory Visit (INDEPENDENT_AMBULATORY_CARE_PROVIDER_SITE_OTHER): Payer: Medicare Other | Admitting: Physician Assistant

## 2018-12-17 DIAGNOSIS — N390 Urinary tract infection, site not specified: Secondary | ICD-10-CM | POA: Diagnosis not present

## 2018-12-17 MED ORDER — SULFAMETHOXAZOLE-TRIMETHOPRIM 800-160 MG PO TABS: 1.0000 | ORAL_TABLET | Freq: Two times a day (BID) | ORAL | 0 refills | Status: DC

## 2018-12-17 NOTE — Progress Notes (Signed)
Telephone visit  Subjective: CC:uti PCP: Sharion Balloon, FNP [id number] Lorraine Leonard is a 78 y.o. female calls for telephone consult today. Patient provides verbal consent for consult held via phone.  Patient is identified with 2 separate identifiers.  At this time the entire area is on COVID-19 social distancing and stay home orders are in place.  Patient is of higher risk and therefore we are performing this by a virtual method.  Location of patient: home Location of provider: [facility] Others present for call: no  This patient has had several days of dysuria, frequency and nocturia. There is also pain over the bladder in the suprapubic region, no back pain. Denies leakage or hematuria.  Denies fever or chills. No pain in flank area.    ROS: Per HPI  Allergies  Allergen Reactions  . Allopurinol Swelling    Feet and legs swell  . Mobic [Meloxicam] Swelling and Other (See Comments)    Feet and Leg swelling   Past Medical History:  Diagnosis Date  . Acute diastolic CHF (congestive heart failure) ([place]) 11/11/2016  . Allergic rhinitis   . Anxiety   . Anxiety   . Arthritis    "~ all my joints" (05/18/2017)  . Atrial fibrillation ([place]) 09/2016  . Atrial fibrillation with RVR ([place]) 05/18/2017  . Chronic lower back pain   . COPD (chronic obstructive pulmonary disease) ([place])   . Depression   . Diverticulosis   . Dyspnea   . Dysrhythmia   . GERD (gastroesophageal reflux disease)   . Gout   . 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 Hyperlipidemia   . Hypertension   . Hypothyroidism   . Macular degeneration of both eyes   . Neuropathy   . On home oxygen therapy    "3L; 24/7" (05/18/2017)  . Pre-diabetes   . 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 Pulmonary fibrosis ([place])   . Pulmonary fibrosis ([place])   . Rheumatoid arthritis ([place])   . Sleep apnea    uses 3 liters of o2 24/7  . Urticaria     Current Outpatient Medications:   .  acetaminophen (TYLENOL) 500 MG tablet, Take 500 mg by mouth every 6 (six) hours as needed (pain)., Disp: , Rfl:  .  albuterol (PROVENTIL) (2.5 MG/3ML) 0.083% nebulizer solution, Take 3 mLs (2.5 mg total) by nebulization every 6 (six) hours as needed for wheezing or shortness of breath. J96.01, Disp: 150 mL, Rfl: 1 .  atenolol (TENORMIN) 50 MG tablet, Take 1 tablet (50 mg total) by mouth daily., Disp: 90 tablet, Rfl: 3 .  azelastine (ASTELIN) 0.1 % nasal spray, Place 2 sprays into both nostrils 2 (two) times daily. Use in each nostril as directed, Disp: 30 mL, Rfl: 12 .  calcium citrate (CALCITRATE - DOSED IN MG ELEMENTAL CALCIUM) 950 MG tablet, Take 1 tablet by mouth 2 (two) times daily. , Disp: , Rfl:  .  cyanocobalamin 1000 MCG tablet, Take 1,000 mcg by mouth daily. Vitamin b12 every AM, Disp: , Rfl:  .  diazepam (VALIUM) 5 MG tablet, TAKE 1 TABLET BY MOUTH TWICE A DAY AS NEEDED FOR ANXIETY, Disp: 60 tablet, Rfl: 2 .  diclofenac sodium (VOLTAREN) 1 % GEL, Apply 2 g topically 4 (four) times daily. (Patient taking differently: Apply 2 g topically 4 (four) times daily. AS NEEDED), Disp: 100 g, Rfl: 3 .  digoxin (LANOXIN) 0.125 MG tablet, Take 0.125  mg by mouth every morning., Disp: , Rfl:  .  docusate sodium (COLACE) 100 MG capsule, Take 200 mg by mouth daily as needed for mild constipation., Disp: , Rfl:  .  ELIQUIS 5 MG TABS tablet, TAKE 1 TABLET BY MOUTH TWICE A DAY, Disp: 180 tablet, Rfl: 1 .  EPINEPHrine 0.3 mg/0.3 mL IJ SOAJ injection, Inject 0.3 mg into the muscle as needed for anaphylaxis., Disp: , Rfl:  .  ESTRACE VAGINAL 0.1 MG/GM vaginal cream, PLACE [id number] APPLICATORFUL VAGINALLY WEEKLY AS NEEDED AS DIRECTED, Disp: 42.5 g, Rfl: 1 .  famotidine (PEPCID) 20 MG tablet, Take 1 tablet (20 mg total) by mouth 2 (two) times daily for 14 days., Disp: 180 tablet, Rfl: 1 .  fluticasone (FLONASE) 50 MCG/ACT nasal spray, PLACE 1 SPRAY INTO BOTH NOSTRILS 2 (TWO) TIMES DAILY AS NEEDED FOR ALLERGIES OR  RHINITIS. (Patient taking differently: Place 1 spray into both nostrils daily. ), Disp: 48 g, Rfl: 5 .  furosemide (LASIX) 40 MG tablet, Take 2 tablets (80 mg total) by mouth daily., Disp: 180 tablet, Rfl: 3 .  levocetirizine (XYZAL) 5 MG tablet, TAKE 1 TABLET BY MOUTH EVERY DAY IN THE EVENING, Disp: 90 tablet, Rfl: 1 .  levothyroxine (SYNTHROID) 125 MCG tablet, Take 1 tablet (125 mcg total) by mouth daily., Disp: 90 tablet, Rfl: 1 .  metFORMIN (GLUCOPHAGE-XR) 500 MG 24 hr tablet, TAKE 1 TABLET BY MOUTH EVERY DAY WITH BREAKFAST, Disp: 90 tablet, Rfl: 0 .  Multiple Vitamins-Minerals (PRESERVISION AREDS 2) CAPS, Take 1 capsule by mouth 2 (two) times daily., Disp: , Rfl:  .  nitrofurantoin (MACRODANTIN) 100 MG capsule, Take 100 mg by mouth every morning., Disp: , Rfl:  .  Olopatadine HCl (PATADAY) 0.2 % SOLN, Place 1 drop into both eyes daily., Disp: , Rfl:  .  omeprazole (PRILOSEC) 20 MG capsule, Take 20 mg by mouth every morning., Disp: , Rfl:  .  ondansetron (ZOFRAN) 8 MG tablet, TAKE 1 TABLET (8 MG TOTAL) BY MOUTH EVERY 8 (EIGHT) HOURS AS NEEDED FOR NAUSEA OR VOMITING., Disp: 20 tablet, Rfl: 2 .  OXYGEN, Inhale 3 L into the lungs continuous., Disp: , Rfl:  .  Polyethyl Glycol-Propyl Glycol (SYSTANE) 0.4-0.3 % GEL ophthalmic gel, Place 1 application into both eyes at bedtime as needed (dry eyes)., Disp: , Rfl:  .  rosuvastatin (CRESTOR) 10 MG tablet, TAKE 1 TABLET BY MOUTH EVERYDAY AT BEDTIME, Disp: 90 tablet, Rfl: 1 .  spironolactone (ALDACTONE) 25 MG tablet, Take 25 mg by mouth every morning., Disp: , Rfl:  .  sulfamethoxazole-trimethoprim (BACTRIM DS) 800-160 MG tablet, Take 1 tablet by mouth 2 (two) times daily., Disp: 20 tablet, Rfl: 0 .  traMADol (ULTRAM) 50 MG tablet, Take 50 mg by mouth., Disp: , Rfl:  .  umeclidinium bromide (INCRUSE ELLIPTA) 62.5 MCG/INH AEPB, Inhale 1 puff into the lungs daily. As needed, Disp: 1 each, Rfl: 0 .  vitamin C (ASCORBIC ACID) 500 MG tablet, Take 1,000 mg by  mouth daily. AM, Disp: , Rfl:  .  Vitamin D, Ergocalciferol, (DRISDOL) 1.25 MG (50000 UT) CAPS capsule, TAKE 1 CAPSULE (50,000 UNITS TOTAL) BY MOUTH EVERY 7 (SEVEN) DAYS., Disp: 12 capsule, Rfl: 1 .  vitamin Lorraine 400 UNIT capsule, Take 400 Units by mouth daily. AM, Disp: , Rfl:   Assessment/ Plan: 78 y.o. female   1. Recurrent UTI - sulfamethoxazole-trimethoprim (BACTRIM DS) 800-160 MG tablet; Take 1 tablet by mouth 2 (two) times daily.  Dispense: 20 tablet; Refill: 0  No follow-ups on file.  Continue all other maintenance medications as listed above.  Start time: ;3:19 PM End time: 3:25 PM  Meds ordered this encounter  Medications  . sulfamethoxazole-trimethoprim (BACTRIM DS) 800-160 MG tablet    Sig: Take 1 tablet by mouth 2 (two) times daily.    Dispense:  20 tablet    Refill:  0    Order Specific Question:   Supervising Provider    Answer:   Janora Norlander [id number]    Particia Nearing PA-C [place] [phone]

## 2018-12-17 NOTE — Telephone Encounter (Signed)
PT should call her urologists office and let them know she is having another UTI. If she needs antibiotics, please put her in a telephone visit or night clinic. She may also need to drop a urine off for a culture.

## 2018-12-17 NOTE — Telephone Encounter (Signed)
Patient aware that she is to pick up Bactrim from the pharmacy to treat UTI.

## 2018-12-17 NOTE — Telephone Encounter (Signed)
Patient states that she is having another UTI and she would like Christy to call Dr. Lewayne Bunting so they can figure out what's going on. [phone]

## 2018-12-22 ENCOUNTER — Encounter (INDEPENDENT_AMBULATORY_CARE_PROVIDER_SITE_OTHER): Payer: Medicare Other | Admitting: Ophthalmology

## 2018-12-29 ENCOUNTER — Other Ambulatory Visit: Payer: Self-pay | Admitting: Family

## 2018-12-30 ENCOUNTER — Other Ambulatory Visit: Payer: Self-pay | Admitting: Family

## 2019-01-03 ENCOUNTER — Other Ambulatory Visit: Payer: Self-pay | Admitting: Family

## 2019-01-04 ENCOUNTER — Other Ambulatory Visit: Payer: Self-pay | Admitting: *Deleted

## 2019-01-04 MED ORDER — VITAMIN D (ERGOCALCIFEROL) 1.25 MG (50000 UNIT) PO CAPS: 50000.0000 [IU] | ORAL_CAPSULE | ORAL | 1 refills | Status: DC

## 2019-01-07 ENCOUNTER — Other Ambulatory Visit: Payer: Self-pay

## 2019-01-07 ENCOUNTER — Encounter (INDEPENDENT_AMBULATORY_CARE_PROVIDER_SITE_OTHER): Payer: Medicare Other | Admitting: Ophthalmology

## 2019-01-07 DIAGNOSIS — I10 Essential (primary) hypertension: Secondary | ICD-10-CM

## 2019-01-07 DIAGNOSIS — H43813 Vitreous degeneration, bilateral: Secondary | ICD-10-CM

## 2019-01-07 DIAGNOSIS — H35033 Hypertensive retinopathy, bilateral: Secondary | ICD-10-CM | POA: Diagnosis not present

## 2019-01-07 DIAGNOSIS — H353231 Exudative age-related macular degeneration, bilateral, with active choroidal neovascularization: Secondary | ICD-10-CM | POA: Diagnosis not present

## 2019-01-07 LAB — HM DIABETES EYE EXAM

## 2019-01-16 ENCOUNTER — Other Ambulatory Visit: Payer: Self-pay | Admitting: Family

## 2019-01-19 ENCOUNTER — Other Ambulatory Visit: Payer: Self-pay | Admitting: Family

## 2019-01-19 DIAGNOSIS — K219 Gastro-esophageal reflux disease without esophagitis: Secondary | ICD-10-CM

## 2019-01-27 ENCOUNTER — Other Ambulatory Visit: Payer: Self-pay

## 2019-01-27 ENCOUNTER — Telehealth: Payer: Self-pay | Admitting: Family

## 2019-01-27 ENCOUNTER — Ambulatory Visit (INDEPENDENT_AMBULATORY_CARE_PROVIDER_SITE_OTHER): Payer: Medicare Other | Admitting: Family

## 2019-01-27 ENCOUNTER — Encounter: Payer: Self-pay | Admitting: Family

## 2019-01-27 VITALS — BP 120/57 | HR 66 | Temp 98.0°F | Ht 68.0 in | Wt 229.0 lb

## 2019-01-27 DIAGNOSIS — Z23 Encounter for immunization: Secondary | ICD-10-CM

## 2019-01-27 DIAGNOSIS — R34 Anuria and oliguria: Secondary | ICD-10-CM

## 2019-01-27 DIAGNOSIS — R300 Dysuria: Secondary | ICD-10-CM

## 2019-01-27 LAB — URINALYSIS, COMPLETE
Bilirubin, UA: NEGATIVE
Glucose, UA: NEGATIVE
Ketones, UA: NEGATIVE
Leukocytes,UA: NEGATIVE
Nitrite, UA: NEGATIVE
Protein,UA: NEGATIVE
RBC, UA: NEGATIVE
Specific Gravity, UA: 1.015 (ref 1.005–1.030)
Urobilinogen, Ur: 0.2 mg/dL (ref 0.2–1.0)
pH, UA: 7 (ref 5.0–7.5)

## 2019-01-27 LAB — MICROSCOPIC EXAMINATION
Bacteria, UA: NONE SEEN
RBC, Urine: NONE SEEN /hpf (ref 0–2)
Renal Epithel, UA: NONE SEEN /hpf

## 2019-01-27 NOTE — Telephone Encounter (Signed)
It does not show [place] filling these.

## 2019-01-27 NOTE — Patient Instructions (Signed)
Acute Urinary Retention, Female  Acute urinary retention means that you cannot pee (urinate) at all, or that you pee too little and your bladder is not emptied completely. If it is not treated, it can lead to kidney damage or other serious problems. Follow these instructions at home:  Take over-the-counter and prescription medicines only as told by your doctor. Ask your doctor what medicines you should stay away from. Do not take any medicine unless your doctor says it is okay to do so.  If you were sent home with a tube that drains pee from the bladder (catheter), take care of it as told by your doctor.  Drink enough fluid to keep your pee clear or pale yellow.  If you were given an antibiotic, take it as told by your doctor. Do not stop taking the antibiotic even if you start to feel better.  Do not use any products that contain nicotine or tobacco, such as cigarettes and e-cigarettes. If you need help quitting, ask your doctor.  Watch for changes in your symptoms. Tell your doctor about them.  If told, keep track of any changes in your blood pressure at home. Tell your doctor about them.  Keep all follow-up visits as told by your doctor. This is important. Contact a doctor if:  You have spasms or you leak pee when you have spasms. Get help right away if:  You have chills or a fever.  You have blood in your pee.  You have a tube that drains the bladder and: ? The tube stops draining pee. ? The tube falls out. Summary  Acute urinary retention means that you cannot pee at all, or that you pee too little and your bladder is not emptied completely. If it is not treated, it can result in kidney damage or other serious problems.  If you were sent home with a tube that drains pee from the bladder, take care of it as told by your doctor.  Pay attention to any changes in your symptoms. Tell your doctor about them. This information is not intended to replace advice given to you by your  health care provider. Make sure you discuss any questions you have with your health care provider. Document Released: 09/24/2007 Document Revised: 03/20/2017 Document Reviewed: 05/09/2016 Elsevier Patient Education  2020 Elsevier Inc.  

## 2019-01-27 NOTE — Progress Notes (Signed)
   Subjective:    Patient ID: Lorraine Leonard, female    DOB: 07-May-1940, 78 y.o.   MRN: [id number]  Chief Complaint  Patient presents with  . urine frequency    taking cephelaxin    Urinary Tract Infection  This is a recurrent problem. The current episode started in the past 7 days. The problem occurs intermittently. The problem has been waxing and waning. The quality of the pain is described as burning. The pain is at a severity of 0/10. The patient is experiencing no pain. There has been no fever. Associated symptoms include frequency (at times, but then report decreased urine outpt over the last 2-3 days) and urgency. Pertinent negatives include no hematuria, nausea or vomiting. She has tried increased fluids and antibiotics (Keflex) for the symptoms. The treatment provided mild relief.      Review of Systems  Gastrointestinal: Negative for nausea and vomiting.  Genitourinary: Positive for frequency (at times, but then report decreased urine outpt over the last 2-3 days) and urgency. Negative for hematuria.  All other systems reviewed and are negative.      Objective:   Physical Exam Vitals signs reviewed.  Constitutional:      General: She is not in acute distress.    Appearance: She is well-developed.  HENT:     Head: Normocephalic and atraumatic.  Eyes:     Pupils: Pupils are equal, round, and reactive to light.  Neck:     Musculoskeletal: Normal range of motion and neck supple.     Thyroid: No thyromegaly.  Cardiovascular:     Rate and Rhythm: Normal rate. Rhythm irregular.     Heart sounds: Normal heart sounds. No murmur.  Pulmonary:     Effort: Pulmonary effort is normal. No respiratory distress.     Breath sounds: Decreased breath sounds present. No wheezing.     Comments: 3L of O2  Abdominal:     General: Bowel sounds are normal. There is no distension.     Palpations: Abdomen is soft.     Tenderness: There is no abdominal tenderness.  Musculoskeletal: Normal  range of motion.        General: No tenderness.  Skin:    General: Skin is warm and dry.  Neurological:     Mental Status: She is alert and oriented to person, place, and time.     Cranial Nerves: No cranial nerve deficit.     Deep Tendon Reflexes: Reflexes are normal and symmetric.     Comments: Generalized weakness, in wheelchair.  Psychiatric:        Behavior: Behavior normal.        Thought Content: Thought content normal.        Judgment: Judgment normal.       BP (!) 120/57   Pulse 66   Temp 98 F (36.7 C) (Temporal)   Ht '5\' 8"'$  (1.727 m)   Wt 229 lb (103.9 kg)   SpO2 100% Comment: Sitting on 3 liters  BMI 34.82 kg/m      Assessment & Plan:  Lorraine Leonard comes in today with chief complaint of urine frequency (taking cephelaxin)   Diagnosis and orders addressed:  1. Dysuria - Urinalysis, Complete - CMP14+EGFR - CBC with Differential/Platelet  2. Decreased urine output - CMP14+EGFR - CBC with Differential/Platelet - Urine Culture  Force fluids Continue medications Labs pending Keep follow up with specialists    Evelina Dun, FNP

## 2019-01-28 LAB — CBC WITH DIFFERENTIAL/PLATELET
Basophils Absolute: 0 10*3/uL (ref 0.0–0.2)
Basos: 0 %
EOS (ABSOLUTE): 0.2 10*3/uL (ref 0.0–0.4)
Eos: 2 %
Hematocrit: 33.2 % — ABNORMAL LOW (ref 34.0–46.6)
Hemoglobin: 10.8 g/dL — ABNORMAL LOW (ref 11.1–15.9)
Immature Grans (Abs): 0 10*3/uL (ref 0.0–0.1)
Immature Granulocytes: 0 %
Lymphocytes Absolute: 2.1 10*3/uL (ref 0.7–3.1)
Lymphs: 21 %
MCH: 29 pg (ref 26.6–33.0)
MCHC: 32.5 g/dL (ref 31.5–35.7)
MCV: 89 fL (ref 79–97)
Monocytes Absolute: 0.8 10*3/uL (ref 0.1–0.9)
Monocytes: 8 %
Neutrophils Absolute: 6.7 10*3/uL (ref 1.4–7.0)
Neutrophils: 69 %
Platelets: 266 10*3/uL (ref 150–450)
RBC: 3.72 x10E6/uL — ABNORMAL LOW (ref 3.77–5.28)
RDW: 14.8 % (ref 11.7–15.4)
WBC: 9.8 10*3/uL (ref 3.4–10.8)

## 2019-01-28 LAB — CMP14+EGFR
ALT: 16 IU/L (ref 0–32)
AST: 34 IU/L (ref 0–40)
Albumin/Globulin Ratio: 1.1 — ABNORMAL LOW (ref 1.2–2.2)
Albumin: 4.2 g/dL (ref 3.7–4.7)
Alkaline Phosphatase: 92 IU/L (ref 39–117)
BUN/Creatinine Ratio: 16 (ref 12–28)
BUN: 19 mg/dL (ref 8–27)
Bilirubin Total: 0.3 mg/dL (ref 0.0–1.2)
CO2: 27 mmol/L (ref 20–29)
Calcium: 10.2 mg/dL (ref 8.7–10.3)
Chloride: 98 mmol/L (ref 96–106)
Creatinine, Ser: 1.18 mg/dL — ABNORMAL HIGH (ref 0.57–1.00)
GFR calc Af Amer: 51 mL/min/{1.73_m2} — ABNORMAL LOW (ref >59)
GFR calc non Af Amer: 44 mL/min/{1.73_m2} — ABNORMAL LOW (ref >59)
Globulin, Total: 3.7 g/dL (ref 1.5–4.5)
Glucose: 140 mg/dL — ABNORMAL HIGH (ref 65–99)
Potassium: 4.4 mmol/L (ref 3.5–5.2)
Sodium: 141 mmol/L (ref 134–144)
Total Protein: 7.9 g/dL (ref 6.0–8.5)

## 2019-01-28 MED ORDER — OLOPATADINE HCL 0.2 % OP SOLN: 1.0000 [drp] | Freq: Every day | OPHTHALMIC | 5 refills | Status: DC

## 2019-01-28 NOTE — Telephone Encounter (Signed)
Prescription sent to pharmacy.

## 2019-01-31 ENCOUNTER — Other Ambulatory Visit: Payer: Self-pay | Admitting: Family

## 2019-01-31 LAB — URINE CULTURE

## 2019-01-31 MED ORDER — CIPROFLOXACIN HCL 500 MG PO TABS: 500.0000 mg | ORAL_TABLET | Freq: Two times a day (BID) | ORAL | 0 refills | Status: DC

## 2019-02-01 ENCOUNTER — Telehealth: Payer: Self-pay | Admitting: Family

## 2019-02-01 NOTE — Telephone Encounter (Signed)
I can not change it because the other anabiotics are not effective for the bacteria found in her urine. All medications have side effects. She should try it and let us know if she has any adverse reactions.

## 2019-02-01 NOTE — Telephone Encounter (Signed)
Patient read all the information given with her med and it says you should not take if you have rheumatoid arthritis or taking lasix. She states it could lead to death and she is afraid of it. Wants to know if you will change to something else

## 2019-02-02 ENCOUNTER — Telehealth: Payer: Self-pay | Admitting: Family

## 2019-02-02 NOTE — Telephone Encounter (Signed)
Patient is crying on the phone scared to take the cipro. She states that the information sheet given from the pharmacy states that it can cause death and she is so scared to take it. Patient states she does not know what to do.

## 2019-02-02 NOTE — Telephone Encounter (Signed)
Patient was reassured and she will start taking the Cipro today, discussed with her that she grew a type of bacteria that was resistant and that other antibiotics likely would not work as well.

## 2019-02-07 NOTE — Telephone Encounter (Signed)
Aware. 

## 2019-02-11 ENCOUNTER — Other Ambulatory Visit: Payer: Self-pay | Admitting: Family

## 2019-03-08 ENCOUNTER — Other Ambulatory Visit: Payer: Self-pay | Admitting: Family

## 2019-03-08 ENCOUNTER — Ambulatory Visit (INDEPENDENT_AMBULATORY_CARE_PROVIDER_SITE_OTHER): Payer: Medicare Other | Admitting: *Deleted

## 2019-03-08 DIAGNOSIS — I4891 Unspecified atrial fibrillation: Secondary | ICD-10-CM

## 2019-03-08 DIAGNOSIS — I5031 Acute diastolic (congestive) heart failure: Secondary | ICD-10-CM

## 2019-03-08 LAB — CUP PACEART REMOTE DEVICE CHECK
Battery Remaining Longevity: 127 mo
Battery Voltage: 2.96 V
Brady Statistic AP VP Percent: 1.43 %
Brady Statistic AP VS Percent: 35.23 %
Brady Statistic AS VP Percent: 0 %
Brady Statistic AS VS Percent: 63.34 %
Brady Statistic RA Percent Paced: 77.6 %
Brady Statistic RV Percent Paced: 1.43 %
Date Time Interrogation Session: 20201117075640
Implantable Lead Implant Date: 20190128
Implantable Lead Implant Date: 20190128
Implantable Lead Location: 753860
Implantable Lead Location: 753860
Implantable Lead Model: 3830
Implantable Lead Model: 5076
Implantable Pulse Generator Implant Date: 20190128
Lead Channel Impedance Value: 285 Ohm
Lead Channel Impedance Value: 323 Ohm
Lead Channel Impedance Value: 380 Ohm
Lead Channel Impedance Value: 475 Ohm
Lead Channel Sensing Intrinsic Amplitude: 2.125 mV
Lead Channel Sensing Intrinsic Amplitude: 2.125 mV
Lead Channel Sensing Intrinsic Amplitude: 8.125 mV
Lead Channel Sensing Intrinsic Amplitude: 8.125 mV
Lead Channel Setting Pacing Amplitude: 2.5 V
Lead Channel Setting Pacing Amplitude: 3.5 V
Lead Channel Setting Pacing Pulse Width: 0.4 ms
Lead Channel Setting Sensing Sensitivity: 0.9 mV

## 2019-03-18 ENCOUNTER — Other Ambulatory Visit: Payer: Self-pay | Admitting: Physician Assistant

## 2019-03-18 DIAGNOSIS — F419 Anxiety disorder, unspecified: Secondary | ICD-10-CM

## 2019-03-28 ENCOUNTER — Telehealth: Payer: Self-pay | Admitting: Family

## 2019-03-28 NOTE — Telephone Encounter (Signed)
Pt's VM was in regards to refilling her Diazepam Last RF & OV was in April Pt has enough till visit in January

## 2019-03-29 ENCOUNTER — Other Ambulatory Visit: Payer: Self-pay | Admitting: Family

## 2019-03-31 ENCOUNTER — Ambulatory Visit (INDEPENDENT_AMBULATORY_CARE_PROVIDER_SITE_OTHER): Payer: Medicare Other | Admitting: Family Medicine

## 2019-03-31 ENCOUNTER — Encounter: Payer: Self-pay | Admitting: Family Medicine

## 2019-03-31 ENCOUNTER — Other Ambulatory Visit: Payer: Self-pay

## 2019-03-31 DIAGNOSIS — L501 Idiopathic urticaria: Secondary | ICD-10-CM

## 2019-03-31 MED ORDER — PREDNISONE 10 MG (21) PO TBPK: ORAL_TABLET | ORAL | 0 refills | Status: DC

## 2019-03-31 NOTE — Progress Notes (Signed)
Virtual Visit via Telephone Note  I connected with Lorraine Leonard on 03/31/19 at 1:44 PM by telephone and verified that I am speaking with the correct person using two identifiers. Lorraine Leonard is currently located at home and nobody is currently with her during this visit. The provider, Loman Brooklyn, FNP is located in their home at time of visit.  I discussed the limitations, risks, security and privacy concerns of performing an evaluation and management service by telephone and the availability of in person appointments. I also discussed with the patient that there may be a patient responsible charge related to this service. The patient expressed understanding and agreed to proceed.  Subjective: PCP: Sharion Balloon, FNP  Chief Complaint  Patient presents with  . Urticaria   Patient has a history of chronic urticaria.  She reports that her hives are back again.  They have been on and off for the past 2 weeks but were going away when she put calamine lotion on them.  The ones that she currently has are on both arms and her face.  These have been present for the past 3 days and have not decreased with her calamine lotion.  She does sometimes put "itching medicine" and alcohol on the hives.  She is taking Xyzal at bedtime.  She is not taking the Pepcid that was prescribed for her.  She has not tried taking any Benadryl.  She does report that she recently changed her laundry detergent.   ROS: Per HPI  Current Outpatient Medications:  .  acetaminophen (TYLENOL) 500 MG tablet, Take 500 mg by mouth every 6 (six) hours as needed (pain)., Disp: , Rfl:  .  albuterol (PROVENTIL) (2.5 MG/3ML) 0.083% nebulizer solution, Take 3 mLs (2.5 mg total) by nebulization every 6 (six) hours as needed for wheezing or shortness of breath. J96.01, Disp: 150 mL, Rfl: 1 .  atenolol (TENORMIN) 50 MG tablet, Take 1 tablet (50 mg total) by mouth daily., Disp: 90 tablet, Rfl: 3 .  azelastine (ASTELIN) 0.1 % nasal  spray, Place 2 sprays into both nostrils 2 (two) times daily. Use in each nostril as directed, Disp: 30 mL, Rfl: 12 .  calcium citrate (CALCITRATE - DOSED IN MG ELEMENTAL CALCIUM) 950 MG tablet, Take 1 tablet by mouth 2 (two) times daily. , Disp: , Rfl:  .  cyanocobalamin 1000 MCG tablet, Take 1,000 mcg by mouth daily. Vitamin b12 every AM, Disp: , Rfl:  .  diazepam (VALIUM) 5 MG tablet, TAKE 1 TABLET BY MOUTH TWICE A DAY AS NEEDED FOR ANXIETY, Disp: 60 tablet, Rfl: 2 .  diclofenac sodium (VOLTAREN) 1 % GEL, Apply 2 g topically 4 (four) times daily. (Patient taking differently: Apply 2 g topically 4 (four) times daily. AS NEEDED), Disp: 100 g, Rfl: 3 .  docusate sodium (COLACE) 100 MG capsule, Take 200 mg by mouth daily as needed for mild constipation., Disp: , Rfl:  .  ELIQUIS 5 MG TABS tablet, TAKE 1 TABLET BY MOUTH TWICE A DAY, Disp: 180 tablet, Rfl: 1 .  EPINEPHrine 0.3 mg/0.3 mL IJ SOAJ injection, Inject 0.3 mg into the muscle as needed for anaphylaxis., Disp: , Rfl:  .  ESTRACE VAGINAL 0.1 MG/GM vaginal cream, PLACE [id number] APPLICATORFUL VAGINALLY WEEKLY AS NEEDED AS DIRECTED, Disp: 42.5 g, Rfl: 1 .  famotidine (PEPCID) 20 MG tablet, Take 1 tablet (20 mg total) by mouth 2 (two) times daily for 14 days., Disp: 180 tablet, Rfl: 1 .  fluticasone (  FLONASE) 50 MCG/ACT nasal spray, PLACE 1 SPRAY INTO BOTH NOSTRILS 2 (TWO) TIMES DAILY AS NEEDED FOR ALLERGIES OR RHINITIS. (Patient taking differently: Place 1 spray into both nostrils daily. ), Disp: 48 g, Rfl: 5 .  furosemide (LASIX) 40 MG tablet, Take 2 tablets (80 mg total) by mouth daily., Disp: 180 tablet, Rfl: 3 .  INCRUSE ELLIPTA 62.5 MCG/INH AEPB, INHALE 1 PUFF INTO THE LUNGS DAILY. AS NEEDED, Disp: 30 each, Rfl: 1 .  levocetirizine (XYZAL) 5 MG tablet, TAKE 1 TABLET BY MOUTH EVERY DAY IN THE EVENING, Disp: 90 tablet, Rfl: 1 .  levothyroxine (SYNTHROID) 125 MCG tablet, Take 1 tablet (125 mcg total) by mouth daily., Disp: 90 tablet, Rfl: 1 .   metFORMIN (GLUCOPHAGE-XR) 500 MG 24 hr tablet, TAKE 1 TABLET BY MOUTH EVERY DAY WITH BREAKFAST, Disp: 90 tablet, Rfl: 0 .  Multiple Vitamins-Minerals (PRESERVISION AREDS 2) CAPS, Take 1 capsule by mouth 2 (two) times daily., Disp: , Rfl:  .  Olopatadine HCl (PATADAY) 0.2 % SOLN, Place 1 drop into both eyes daily., Disp: 10 mL, Rfl: 5 .  omeprazole (PRILOSEC) 20 MG capsule, TAKE 1 CAPSULE BY MOUTH EVERY DAY, Disp: 90 capsule, Rfl: 1 .  ondansetron (ZOFRAN) 8 MG tablet, TAKE 1 TABLET (8 MG TOTAL) BY MOUTH EVERY 8 (EIGHT) HOURS AS NEEDED FOR NAUSEA OR VOMITING., Disp: 20 tablet, Rfl: 2 .  OXYGEN, Inhale 3 L into the lungs continuous., Disp: , Rfl:  .  Polyethyl Glycol-Propyl Glycol (SYSTANE) 0.4-0.3 % GEL ophthalmic gel, Place 1 application into both eyes at bedtime as needed (dry eyes)., Disp: , Rfl:  .  rosuvastatin (CRESTOR) 10 MG tablet, TAKE 1 TABLET BY MOUTH EVERYDAY AT BEDTIME, Disp: 90 tablet, Rfl: 1 .  spironolactone (ALDACTONE) 25 MG tablet, TAKE 1 TABLET BY MOUTH EVERY DAY, Disp: 90 tablet, Rfl: 1 .  vitamin C (ASCORBIC ACID) 500 MG tablet, Take 1,000 mg by mouth daily. AM, Disp: , Rfl:  .  Vitamin D, Ergocalciferol, (DRISDOL) 1.25 MG (50000 UT) CAPS capsule, Take 1 capsule (50,000 Units total) by mouth every 7 (seven) days., Disp: 12 capsule, Rfl: 1 .  vitamin E 400 UNIT capsule, Take 400 Units by mouth daily. AM, Disp: , Rfl:   Allergies  Allergen Reactions  . Allopurinol Swelling    Feet and legs swell  . Mobic [Meloxicam] Swelling and Other (See Comments)    Feet and Leg swelling   Past Medical History:  Diagnosis Date  . Acute diastolic CHF (congestive heart failure) ([place]) 11/11/2016  . Allergic rhinitis   . Anxiety   . Anxiety   . Arthritis    "~ all my joints" (05/18/2017)  . Atrial fibrillation ([place]) 09/2016  . Atrial fibrillation with RVR ([place]) 05/18/2017  . Chronic lower back pain   . COPD (chronic obstructive pulmonary disease) ([place])   . Depression   . Diverticulosis    . Dyspnea   . Dysrhythmia   . GERD (gastroesophageal reflux disease)   . Gout   . 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 Hyperlipidemia   . Hypertension   . Hypothyroidism   . Macular degeneration of both eyes   . Neuropathy   . On home oxygen therapy    "3L; 24/7" (05/18/2017)  . Pre-diabetes   . 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 Pulmonary fibrosis ([place])   . Pulmonary fibrosis ([place])   . Rheumatoid arthritis ([place])   .  Sleep apnea    uses 3 liters of o2 24/7  . Urticaria     Observations/Objective: A&O  No respiratory distress or wheezing audible over the phone Mood, judgement, and thought processes all WNL  Assessment and Plan: 1. Idiopathic urticaria - Encouraged patient to take Pepcid twice daily now and in the future at the beginning of a breakout of hives.  Also encouraged to take Benadryl now.  Continue Xyzal at bedtime.  Discussed switching back to a laundry detergent for sensitive skin without dyes or perfumes.  Offered patient a referral to allergy and asthma specialist but she declines. - predniSONE (STERAPRED UNI-PAK 21 TAB) 10 MG (21) TBPK tablet; Use as directed on back of pill pack  Dispense: 21 tablet; Refill: 0   Follow Up Instructions:  I discussed the assessment and treatment plan with the patient. The patient was provided an opportunity to ask questions and all were answered. The patient agreed with the plan and demonstrated an understanding of the instructions.   The patient was advised to call back or seek an in-person evaluation if the symptoms worsen or if the condition fails to improve as anticipated.  The above assessment and management plan was discussed with the patient. The patient verbalized understanding of and has agreed to the management plan. Patient is aware to call the clinic if symptoms persist or worsen. Patient is aware when to return to the clinic for a follow-up visit. Patient  educated on when it is appropriate to go to the emergency department.   Time call ended: 1:55 PM  I provided 15 minutes of non-face-to-face time during this encounter.  Hendricks Limes, MSN, APRN, FNP-C [facility] 03/31/19

## 2019-04-04 NOTE — Progress Notes (Signed)
Remote pacemaker transmission.   

## 2019-04-05 ENCOUNTER — Ambulatory Visit (INDEPENDENT_AMBULATORY_CARE_PROVIDER_SITE_OTHER): Payer: Medicare Other | Admitting: Internal Medicine

## 2019-04-05 ENCOUNTER — Encounter: Payer: Self-pay | Admitting: Internal Medicine

## 2019-04-05 VITALS — BP 144/74 | HR 57 | Temp 97.8°F | Ht 65.0 in | Wt 229.0 lb

## 2019-04-05 DIAGNOSIS — I5032 Chronic diastolic (congestive) heart failure: Secondary | ICD-10-CM | POA: Diagnosis not present

## 2019-04-05 DIAGNOSIS — I4819 Other persistent atrial fibrillation: Secondary | ICD-10-CM

## 2019-04-05 NOTE — Progress Notes (Signed)
HPI Lorraine Leonard returns today for followup. She is a pleasant 78 yo woman with a h/o chronic systolic heart failure, COPD, near end stage, morbid obesity, and prior PPM/AV node ablation. She has continued to become more sedentary. She spends much of her day in a chair or seated.She remains on 3 liters of Oxygen by [place]. She has tried to avoid salty food but admits to eating between meals. Allergies  Allergen Reactions  . Allopurinol Swelling    Feet and legs swell  . Mobic [Meloxicam] Swelling and Other (See Comments)    Feet and Leg swelling     Current Outpatient Medications  Medication Sig Dispense Refill  . acetaminophen (TYLENOL) 500 MG tablet Take 500 mg by mouth every 6 (six) hours as needed (pain).    Marland Kitchen albuterol (PROVENTIL) (2.5 MG/3ML) 0.083% nebulizer solution Take 3 mLs (2.5 mg total) by nebulization every 6 (six) hours as needed for wheezing or shortness of breath. J96.01 150 mL 1  . atenolol (TENORMIN) 50 MG tablet Take 1 tablet (50 mg total) by mouth daily. 90 tablet 3  . azelastine (ASTELIN) 0.1 % nasal spray Place 2 sprays into both nostrils 2 (two) times daily. Use in each nostril as directed 30 mL 12  . calcium citrate (CALCITRATE - DOSED IN MG ELEMENTAL CALCIUM) 950 MG tablet Take 1 tablet by mouth 2 (two) times daily.     . cyanocobalamin 1000 MCG tablet Take 1,000 mcg by mouth daily. Vitamin b12 every AM    . diazepam (VALIUM) 5 MG tablet TAKE 1 TABLET BY MOUTH TWICE A DAY AS NEEDED FOR ANXIETY 60 tablet 2  . diclofenac sodium (VOLTAREN) 1 % GEL Apply 2 g topically 4 (four) times daily. (Patient taking differently: Apply 2 g topically 4 (four) times daily. AS NEEDED) 100 g 3  . docusate sodium (COLACE) 100 MG capsule Take 200 mg by mouth daily as needed for mild constipation.    Marland Kitchen ELIQUIS 5 MG TABS tablet TAKE 1 TABLET BY MOUTH TWICE A DAY 180 tablet 1  . EPINEPHrine 0.3 mg/0.3 mL IJ SOAJ injection Inject 0.3 mg into the muscle as needed for anaphylaxis.    Marland Kitchen  ESTRACE VAGINAL 0.1 MG/GM vaginal cream PLACE [id number] APPLICATORFUL VAGINALLY WEEKLY AS NEEDED AS DIRECTED 42.5 g 1  . fluticasone (FLONASE) 50 MCG/ACT nasal spray PLACE 1 SPRAY INTO BOTH NOSTRILS 2 (TWO) TIMES DAILY AS NEEDED FOR ALLERGIES OR RHINITIS. (Patient taking differently: Place 1 spray into both nostrils daily. ) 48 g 5  . furosemide (LASIX) 40 MG tablet Take 2 tablets (80 mg total) by mouth daily. 180 tablet 3  . INCRUSE ELLIPTA 62.5 MCG/INH AEPB INHALE 1 PUFF INTO THE LUNGS DAILY. AS NEEDED 30 each 1  . levocetirizine (XYZAL) 5 MG tablet TAKE 1 TABLET BY MOUTH EVERY DAY IN THE EVENING 90 tablet 1  . levothyroxine (SYNTHROID) 125 MCG tablet Take 1 tablet (125 mcg total) by mouth daily. 90 tablet 1  . metFORMIN (GLUCOPHAGE-XR) 500 MG 24 hr tablet TAKE 1 TABLET BY MOUTH EVERY DAY WITH BREAKFAST 90 tablet 0  . Multiple Vitamins-Minerals (PRESERVISION AREDS 2) CAPS Take 1 capsule by mouth 2 (two) times daily.    . Olopatadine HCl (PATADAY) 0.2 % SOLN Place 1 drop into both eyes daily. 10 mL 5  . omeprazole (PRILOSEC) 20 MG capsule TAKE 1 CAPSULE BY MOUTH EVERY DAY 90 capsule 1  . ondansetron (ZOFRAN) 8 MG tablet TAKE 1 TABLET (8 MG TOTAL)  BY MOUTH EVERY 8 (EIGHT) HOURS AS NEEDED FOR NAUSEA OR VOMITING. 20 tablet 2  . OXYGEN Inhale 3 L into the lungs continuous.    Vladimir Faster Glycol-Propyl Glycol (SYSTANE) 0.4-0.3 % GEL ophthalmic gel Place 1 application into both eyes at bedtime as needed (dry eyes).    . rosuvastatin (CRESTOR) 10 MG tablet TAKE 1 TABLET BY MOUTH EVERYDAY AT BEDTIME 90 tablet 1  . spironolactone (ALDACTONE) 25 MG tablet TAKE 1 TABLET BY MOUTH EVERY DAY 90 tablet 1  . vitamin C (ASCORBIC ACID) 500 MG tablet Take 1,000 mg by mouth daily. AM    . Vitamin D, Ergocalciferol, (DRISDOL) 1.25 MG (50000 UT) CAPS capsule Take 1 capsule (50,000 Units total) by mouth every 7 (seven) days. 12 capsule 1  . vitamin E 400 UNIT capsule Take 400 Units by mouth daily. AM    . famotidine (PEPCID)  20 MG tablet Take 1 tablet (20 mg total) by mouth 2 (two) times daily for 14 days. 180 tablet 1   No current facility-administered medications for this visit.     Past Medical History:  Diagnosis Date  . Acute diastolic CHF (congestive heart failure) ([place]) 11/11/2016  . Allergic rhinitis   . Anxiety   . Anxiety   . Arthritis    "~ all my joints" (05/18/2017)  . Atrial fibrillation ([place]) 09/2016  . Atrial fibrillation with RVR ([place]) 05/18/2017  . Chronic lower back pain   . COPD (chronic obstructive pulmonary disease) ([place])   . Depression   . Diverticulosis   . Dyspnea   . Dysrhythmia   . GERD (gastroesophageal reflux disease)   . Gout   . 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 Hyperlipidemia   . Hypertension   . Hypothyroidism   . Macular degeneration of both eyes   . Neuropathy   . On home oxygen therapy    "3L; 24/7" (05/18/2017)  . Pre-diabetes   . 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 Pulmonary fibrosis ([place])   . Pulmonary fibrosis ([place])   . Rheumatoid arthritis ([place])   . Sleep apnea    uses 3 liters of o2 24/7  . Urticaria     ROS:   All systems reviewed and negative except as noted in the HPI.   Past Surgical History:  Procedure Laterality Date  . AV NODE ABLATION  05/18/2017  . AV NODE ABLATION N/A 05/18/2017   Procedure: AV NODE ABLATION;  Surgeon: Evans Lance, MD;  Location: [facility];  Service: Cardiovascular;  Laterality: N/A;  . BACK SURGERY    . BREAST LUMPECTOMY Left   . CARDIOVERSION N/A 01/30/2017   Procedure: CARDIOVERSION;  Surgeon: Josue Hector, MD;  [facility];  Service: Cardiovascular;  Laterality: N/A;  . CARDIOVERSION N/A 04/09/2017   Procedure: CARDIOVERSION;  Surgeon: Satira Sark, MD;  Location: [facility];  Service: Cardiovascular;  Laterality: N/A;  . CATARACT EXTRACTION W/ INTRAOCULAR LENS  IMPLANT, BILATERAL Bilateral   . DILATION AND  CURETTAGE OF UTERUS    . EUS N/A 10/03/2015   Procedure: UPPER ENDOSCOPIC ULTRASOUND (EUS) RADIAL;  Surgeon: Arta Silence, MD;  Location: [facility];  Service: Endoscopy;  Laterality: N/A;  . FINE NEEDLE ASPIRATION N/A 10/03/2015   Procedure: FINE NEEDLE ASPIRATION (FNA) RADIAL;  Surgeon: Arta Silence, MD;  Location: [facility];  Service: Endoscopy;  Laterality: N/A;  . INSERT / REPLACE / REMOVE PACEMAKER  05/18/2017  . LUMBAR LAMINECTOMY  1995; 06/23/2008   L4-5/notes 08/20/2010  . [place]   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  . PACEMAKER IMPLANT N/A 05/18/2017   Procedure: PACEMAKER IMPLANT;  Surgeon: Evans Lance, MD;  Location: [facility];  Service: Cardiovascular;  Laterality: N/A;  . TOTAL ABDOMINAL HYSTERECTOMY    . TUBAL LIGATION       Family History  Problem Relation Age of Onset  . Diabetes Other   . Hypertension Other   . Coronary artery disease Other   . Stroke Other   . Asthma Other   . Kidney disease Sister   . Cancer Sister        lung  . Lung cancer Sister   . Cancer Sister        lung  . Lung cancer Sister   . Cancer Sister        lung  . Suicidality Son        committed suicide in 2009  . CVA Son   . Hyperlipidemia Other   . Anxiety disorder Other   . Migraines Other   . Heart failure Father   . Healthy Brother   . Cancer Sister        lung  . Healthy Sister      Social History   Socioeconomic History  . Marital status: Married    Spouse name: Mortimer Fries  . Number of children: 4  . Years of education: Not on file  . Highest education level: 9th grade  Occupational History  . Occupation: retired    Comment: cna  Tobacco Use  . Smoking status: Never Smoker  . Smokeless tobacco: Never Used  Substance and Sexual Activity  . Alcohol use: No  . Drug use: No  . Sexual activity: Not Currently    Birth control/protection: Surgical  Other Topics Concern  . Not on file  Social History Narrative   Pt ONLY HAS ONE  KIDNEY, 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   Pt lives with her husband in a single wide trailer. They have 4 children and 13 grandchildren.    Social Determinants of Health   Financial Resource Strain:   . Difficulty of Paying Living Expenses: Not on file  Food Insecurity:   . Worried About Charity fundraiser in the Last Year: Not on file  . Ran Out of Food in the Last Year: Not on file  Transportation Needs:   . Lack of Transportation (Medical): Not on file  . Lack of Transportation (Non-Medical): Not on file  Physical Activity:   . Days of Exercise per Week: Not on file  . Minutes of Exercise per Session: Not on file  Stress:   . Feeling of Stress : Not on file  Social Connections:   . Frequency of Communication with Friends and Family: Not on file  . Frequency of Social Gatherings with Friends and Family: Not on file  . Attends Religious Services: Not on file  . Active Member of Clubs or Organizations: Not on file  . Attends Archivist Meetings: Not on file  . Marital Status: Not on file  Intimate Partner Violence:   . Fear of Current or Ex-Partner: Not on file  . Emotionally Abused: Not on file  . Physically Abused: Not on file  . Sexually Abused: Not on file     BP (!) 144/74   Pulse (!) 57   Temp 97.8  F (36.6 C)   Ht 5\' 5"  (1.651 m)   SpO2 97%   BMI 38.11 kg/m   Physical Exam:  Chronically ill appearing morbidly obese, woman, NAD HEENT: Unremarkable Neck:  6 cm JVD, no thyromegally Lymphatics:  No adenopathy Back:  No CVA tenderness Lungs:  Clear with no wheezes HEART:  Regular rate rhythm, no murmurs, no rubs, no clicks Abd:  soft, positive bowel sounds, no organomegally, no rebound, no guarding Ext:  2 plus pulses, no edema, no cyanosis, no clubbing Skin:  No rashes no nodules Neuro:  CN II through XII intact, motor grossly intact  DEVICE  Normal device function.  See PaceArt for details.   Assess/Plan: 1. Atrial fib - her vr  is well controlled.  2. PPM - her DDD PM has a lead in the his bundle and RV. Her his bundle threshold is up a bit.  3. COPD/Pulmonary fibrosis - she will continue her bronchodilators and oxygen and followup with Dr. Lamonte Sakai.  Mikle Bosworth.D.

## 2019-04-05 NOTE — Patient Instructions (Signed)
Medication Instructions:  Your physician recommends that you continue on your current medications as directed. Please refer to the Current Medication list given to you today.  *If you need a refill on your cardiac medications before your next appointment, please call your pharmacy*  Lab Work: NONE   If you have labs (blood work) drawn today and your tests are completely normal, you will receive your results only by: Marland Kitchen MyChart Message (if you have MyChart) OR . A paper copy in the mail If you have any lab test that is abnormal or we need to change your treatment, we will call you to review the results.  Testing/Procedures: NONE   Follow-Up: At [facility], you and your health needs are our priority.  As part of our continuing mission to provide you with exceptional heart care, we have created designated Provider Care Teams.  These Care Teams include your primary Cardiologist (physician) and Advanced Practice Providers (APPs -  Physician Assistants and Nurse Practitioners) who all work together to provide you with the care you need, when you need it.  Your next appointment:      The format for your next appointment:   In Person  Provider:   Dr. Lamonte Sakai   Other Instructions Thank you for choosing [facility]!

## 2019-04-07 ENCOUNTER — Other Ambulatory Visit: Payer: Self-pay | Admitting: Family

## 2019-04-12 ENCOUNTER — Telehealth: Payer: Self-pay | Admitting: Family

## 2019-04-12 NOTE — Telephone Encounter (Signed)
Patient called back wanting to know if the Prednisone was going to be called in for her. Saw that Alyse Low had made a note about scheduling patient for televisit. Patient said she needs the appt and Prednisone today because she is in a lot of pain. Was crying on the phone.

## 2019-04-12 NOTE — Telephone Encounter (Signed)
Please schedule her a telephone visit. She is a diabetic and steroids have risks.

## 2019-04-12 NOTE — Telephone Encounter (Signed)
Please advise we have nothing

## 2019-04-12 NOTE — Telephone Encounter (Signed)
Patient says she has been on Prednisone for Hives and stopped taking it this past Friday and says she still has the hives around her neck. Wants to speak with [facility] or nurse.

## 2019-04-12 NOTE — Telephone Encounter (Signed)
Pt is requesting more prednisone for her hives. She states that it did get better but she still has it on her neck and would like more called in.

## 2019-04-25 ENCOUNTER — Telehealth: Payer: Self-pay | Admitting: Emergency Medicine

## 2019-04-25 ENCOUNTER — Ambulatory Visit (INDEPENDENT_AMBULATORY_CARE_PROVIDER_SITE_OTHER): Payer: Medicare Other | Admitting: Acute Care

## 2019-04-25 ENCOUNTER — Encounter: Payer: Self-pay | Admitting: Acute Care

## 2019-04-25 ENCOUNTER — Other Ambulatory Visit: Payer: Self-pay

## 2019-04-25 DIAGNOSIS — M069 Rheumatoid arthritis, unspecified: Secondary | ICD-10-CM | POA: Diagnosis not present

## 2019-04-25 DIAGNOSIS — J849 Interstitial pulmonary disease, unspecified: Secondary | ICD-10-CM | POA: Diagnosis not present

## 2019-04-25 DIAGNOSIS — I2721 Secondary pulmonary arterial hypertension: Secondary | ICD-10-CM

## 2019-04-25 DIAGNOSIS — J449 Chronic obstructive pulmonary disease, unspecified: Secondary | ICD-10-CM

## 2019-04-25 DIAGNOSIS — K766 Portal hypertension: Secondary | ICD-10-CM

## 2019-04-25 MED ORDER — ALBUTEROL SULFATE HFA 108 (90 BASE) MCG/ACT IN AERS: 2.0000 | INHALATION_SPRAY | Freq: Four times a day (QID) | RESPIRATORY_TRACT | 0 refills | Status: DC | PRN

## 2019-04-25 MED ORDER — PREDNISONE 10 MG PO TABS: ORAL_TABLET | ORAL | 0 refills | Status: DC

## 2019-04-25 NOTE — Patient Instructions (Addendum)
It was good to talk with you today.  Please continue Incruse once daily, every day without fail. . Rinse mouth after use  We will send a prednisone taper Prednisone taper; 10 mg tablets: 4 tabs x 2 days, 3 tabs x 2 days, 2 tabs x 2 days 1 tab x 2 days then stop. Continue oxygen at 3 L [place] all day every day Saturation goals are 88-92% We will send in a prescription for an albuterol inhaler  Use if needed for shortness of breath, chest tightness, wheezing up to 3 times daily. If you need this more frequently, please call the office to be seen.  We will order an HRCT chest to evaluate for progression of your interstitial lung disease You may need labs prior to the scan.  Follow up with Dr. Lamonte Sakai 05/13/2019 at 1:45 pm ( CT should be done by this time for review) Please contact office for sooner follow up if symptoms do not improve or worsen or seek emergency care  Continue Lasix daily Continue oxygen at 3 L [place] Saturation goals are 88-92% Please contact office for sooner follow up if symptoms do not improve or worsen or seek emergency care    Follow Up Instructions: May 13, 2019 at 1:45 Please contact office for sooner follow up if symptoms do not improve or worsen or seek emergency care

## 2019-04-25 NOTE — Telephone Encounter (Signed)
Primary Pulmonologist: Dr. Lamonte Sakai Last office visit and with whom: 12/07/18 RB What do we see them for (pulmonary problems): COPD/PF Last OV assessment/plan:   Please continue Incruse once daily. Please keep albuterol available to use if needed for shortness of breath, chest tightness, wheezing. We will not repeat your CT scan of the chest at this time.  We will follow you clinically, consider repeating a chest x-ray intermittently going forward I believe you need to follow-up with Dr. Trudie Reed with rheumatology to clarify which dose of prednisone you should be taking (if any). Follow with Dr Lamonte Sakai in 6 months or sooner if you have any problems   Was appointment offered to patient (explain)?  Yes Patient declined OV, but would like Prednisone called to [place].   Reason for call:  Patient stated she is having increased sob with exertion.  Patient stated short ambulation to her bathroom is leaving her sob, more than her normal. Patient stated she has developed a cough that is mostly non productive, but does have occasional clear to white phlegm.  Patient denies any fever or chills. Patient is requesting Prednisone prescription.  Message routed to [place], NP- Provider of the day

## 2019-04-25 NOTE — Telephone Encounter (Signed)
Called spoke with patient - televisit scheduled with Judson Roch NP @ [phone] (patient reported she does not own a computer or a smart phone).  Nothing further needed; will sign off.

## 2019-04-25 NOTE — Telephone Encounter (Signed)
Needs OV , can put on Eric Form schedule , video visit .

## 2019-04-25 NOTE — Progress Notes (Signed)
Virtual Visit via Telephone Note  I connected with Lorraine Leonard on 04/25/19 at 11:30 AM EST by telephone and verified that I am speaking with the correct person using two identifiers.  Location: Patient: At home Provider: [place], [place] , [address]   I discussed the limitations, risks, security and privacy concerns of performing an evaluation and management service by telephone and the availability of in person appointments. I also discussed with the patient that there may be a patient responsible charge related to this service. The patient expressed understanding and agreed to proceed.  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 She wears oxygen at 3 L continuously.Possible  obstructive changes noted on spirometry, with improvement with her Incruse. She is followed by Dr. Lamonte Sakai.    History of Present Illness: Pt. Presents for an acute tele visit. She was last seen in the office 11/2018. She states she has had increased shortness of breath with exertion for the last 2 months, maybe more. Her oxygen saturations are 93% at present. She is wearing 3 L [place]. She did have drops in her sats while off her oxygen to 83%. We discussed that she is not to go without her oxygen. We also discussed that 88% sat is too low, and that she needs to increase her oxygen until her sats are > 88%, and then turn it back to 3 L once her sats are > 88%. She verbalized understanding. She states she  has been using her Incruse every day for the past 2 months. She does not have a rescue inhaler and she is not using her nebs. She states the neb machine is too much trouble and does not help a lot. She states she has been having hives also. She stopped going to Rheumatology about 1 year ago, because she was no longer covered for the injections she was getting. She needs to see Dr. Lenna Gilford She states she is coughing up clear to white secretions. She also has a dry cough.  She states she does have some ankle swelling, but she is compliant with her Lasix. She states she is wheezing with her shortness of breath. She has had no fever. She denies any sick contacts. She did go to her son's house for Christmas. I recommended that she get a COVID test and she refused. I explained with the timing of her visit with her family COVID was a possibility. She refused.She states she does not have COVID as she has had these symptoms for the last 2 months. She states that she has not been on any prednisone for her ILD or her RA.Lorraine Leonard    Observations/Objective:   Assessment and Plan: ILD/ COPD Flare Shortness of breath with exertion x 2-3 months Wearing oxygen at 3 l [place] No Fever Scant White secretions Some wheezing with exertion Plan Please continue Incruse once daily, every day without fail. . Rinse mouth after use  We will send a prednisone taper Prednisone taper; 10 mg tablets: 4 tabs x 2 days, 3 tabs x 2 days, 2 tabs x 2 days 1 tab x 2 days then stop. Continue oxygen at 3 L [place] all day every day Saturation goals are 88-92% We will send in a prescription for an albuterol inhaler  Use if needed for shortness of breath, chest tightness, wheezing up to 3 times daily. If you need this more frequently, please call the office to be seen.  We will order  an HRCT chest to evaluate for progression of your interstitial lung disease Follow up with Dr. Lamonte Sakai 05/13/2019 at 1:45 pm ( CT should be done by this time for review) Please contact office for sooner follow up if symptoms do not improve or worsen or seek emergency care   Chronic PAH Plan Continue Lasix daily Continue oxygen at 3 L [place] Saturation goals are 88-92% Please contact office for sooner follow up if symptoms do not improve or worsen or seek emergency care   RA Plan Follow up with Evelina Dun, FNP   Follow Up Instructions: May 13, 2019 at 1:45 with Dr. Lamonte Sakai Her husband has recently had a heart attack and has been  in the hospital. He is getting discharged tomorrow 04/26/2019   I discussed the assessment and treatment plan with the patient. The patient was provided an opportunity to ask questions and all were answered. The patient agreed with the plan and demonstrated an understanding of the instructions.   The patient was advised to call back or seek an in-person evaluation if the symptoms worsen or if the condition fails to improve as anticipated.  I provided 35  minutes of non-face-to-face time during this encounter.   Magdalen Spatz, NP 04/25/2019 12:31 PM

## 2019-04-26 ENCOUNTER — Other Ambulatory Visit: Payer: Self-pay | Admitting: *Deleted

## 2019-04-26 MED ORDER — FUROSEMIDE 40 MG PO TABS: 40.0000 mg | ORAL_TABLET | Freq: Two times a day (BID) | ORAL | 3 refills | Status: DC

## 2019-04-28 ENCOUNTER — Other Ambulatory Visit: Payer: Self-pay

## 2019-04-29 ENCOUNTER — Other Ambulatory Visit: Payer: Self-pay | Admitting: Family

## 2019-04-29 ENCOUNTER — Ambulatory Visit (INDEPENDENT_AMBULATORY_CARE_PROVIDER_SITE_OTHER): Payer: Medicare Other | Admitting: Family

## 2019-04-29 ENCOUNTER — Encounter: Payer: Self-pay | Admitting: Family

## 2019-04-29 DIAGNOSIS — E1142 Type 2 diabetes mellitus with diabetic polyneuropathy: Secondary | ICD-10-CM | POA: Diagnosis not present

## 2019-04-29 DIAGNOSIS — F331 Major depressive disorder, recurrent, moderate: Secondary | ICD-10-CM

## 2019-04-29 DIAGNOSIS — J449 Chronic obstructive pulmonary disease, unspecified: Secondary | ICD-10-CM

## 2019-04-29 DIAGNOSIS — E1169 Type 2 diabetes mellitus with other specified complication: Secondary | ICD-10-CM

## 2019-04-29 DIAGNOSIS — E039 Hypothyroidism, unspecified: Secondary | ICD-10-CM

## 2019-04-29 DIAGNOSIS — E1159 Type 2 diabetes mellitus with other circulatory complications: Secondary | ICD-10-CM

## 2019-04-29 DIAGNOSIS — I10 Essential (primary) hypertension: Secondary | ICD-10-CM

## 2019-04-29 DIAGNOSIS — F419 Anxiety disorder, unspecified: Secondary | ICD-10-CM

## 2019-04-29 DIAGNOSIS — I4891 Unspecified atrial fibrillation: Secondary | ICD-10-CM

## 2019-04-29 DIAGNOSIS — E785 Hyperlipidemia, unspecified: Secondary | ICD-10-CM

## 2019-04-29 DIAGNOSIS — I272 Pulmonary hypertension, unspecified: Secondary | ICD-10-CM

## 2019-04-29 DIAGNOSIS — K5901 Slow transit constipation: Secondary | ICD-10-CM

## 2019-04-29 DIAGNOSIS — M159 Polyosteoarthritis, unspecified: Secondary | ICD-10-CM

## 2019-04-29 DIAGNOSIS — I152 Hypertension secondary to endocrine disorders: Secondary | ICD-10-CM

## 2019-04-29 DIAGNOSIS — J8410 Pulmonary fibrosis, unspecified: Secondary | ICD-10-CM

## 2019-04-29 MED ORDER — BUSPIRONE HCL 5 MG PO TABS: 5.0000 mg | ORAL_TABLET | Freq: Three times a day (TID) | ORAL | 2 refills | Status: DC | PRN

## 2019-04-29 MED ORDER — BUSPIRONE HCL 5 MG PO TABS: 5.0000 mg | ORAL_TABLET | Freq: Three times a day (TID) | ORAL | 2 refills | Status: DC

## 2019-04-29 NOTE — Progress Notes (Signed)
Virtual Visit via telephone Note Due to COVID-19 pandemic this visit was conducted virtually. This visit type was conducted due to national recommendations for restrictions regarding the COVID-19 Pandemic (e.g. social distancing, sheltering in place) in an effort to limit this patient's exposure and mitigate transmission in our community. All issues noted in this document were discussed and addressed.  A physical exam was not performed with this format.  I connected with Lorraine Leonard on 04/29/19 at 2:25 pm by telephone and verified that I am speaking with the correct person using two identifiers. Lorraine Leonard is currently located at home and no one  is currently with her during visit. The provider, Evelina Dun, FNP is located in their office at time of visit.  I discussed the limitations, risks, security and privacy concerns of performing an evaluation and management service by telephone and the availability of in person appointments. I also discussed with the patient that there may be a patient responsible charge related to this service. The patient expressed understanding and agreed to proceed.   History and Present Illness:  Pt presents to the office today for chronic follow up. She is followed by Cardiologists every 6 months for CHF & A Fib. She is followed by Pulmonologist annually for pulmonary hypertension and Pulmonary Fibrosis. She is on continuous 3 l of O2.   She reports she has lost 17 lbs since our last visit.  Diabetes She presents for her follow-up diabetic visit. She has type 2 diabetes mellitus. Her disease course has been stable. There are no hypoglycemic associated symptoms. Associated symptoms include fatigue. Pertinent negatives for diabetes include no blurred vision and no foot paresthesias. Symptoms are stable. Diabetic complications include heart disease, nephropathy and peripheral neuropathy. Pertinent negatives for diabetic complications include no CVA. Risk factors  for coronary artery disease include dyslipidemia, diabetes mellitus, hypertension, obesity, sedentary lifestyle and post-menopausal. She is following a generally unhealthy diet. (Does not check BS at home) Eye exam is current.  Hypertension This is a chronic problem. The current episode started more than 1 year ago. The problem has been resolved since onset. The problem is controlled. Associated symptoms include malaise/fatigue, peripheral edema and shortness of breath. Pertinent negatives include no blurred vision. Risk factors for coronary artery disease include diabetes mellitus, dyslipidemia, obesity and sedentary lifestyle. The current treatment provides moderate improvement. Hypertensive end-organ damage includes CAD/MI. There is no history of CVA. Identifiable causes of hypertension include a thyroid problem.  Thyroid Problem Presents for follow-up visit. Symptoms include constipation, depressed mood and fatigue. Patient reports no dry skin. The symptoms have been stable. Her past medical history is significant for hyperlipidemia.  Hyperlipidemia This is a chronic problem. The current episode started more than 1 year ago. The problem is controlled. Recent lipid tests were reviewed and are normal. Exacerbating diseases include obesity. Associated symptoms include shortness of breath. Current antihyperlipidemic treatment includes statins. The current treatment provides moderate improvement of lipids.      Review of Systems  Constitutional: Positive for fatigue and malaise/fatigue.  Eyes: Negative for blurred vision.  Respiratory: Positive for shortness of breath.   Gastrointestinal: Positive for constipation.     Observations/Objective: No SOB or distress noted  Assessment and Plan: Lorraine Leonard comes in today with chief complaint of No chief complaint on file.   Diagnosis and orders addressed:  1. Hypertension associated with diabetes ([place]) - CMP14+EGFR; Future - CBC with  Differential/Platelet; Future  2. Hypothyroidism, unspecified type - CMP14+EGFR; Future -  CBC with Differential/Platelet; Future - TSH; Future  3. Hyperlipidemia associated with type 2 diabetes mellitus (HCC) - CMP14+EGFR; Future - CBC with Differential/Platelet; Future - Lipid panel; Future  4. Type 2 diabetes mellitus with diabetic polyneuropathy, without long-term current use of insulin (HCC) - CMP14+EGFR; Future - CBC with Differential/Platelet; Future - hgba1c; Future  5. Anxiety -Will stop Valium today since she has been without it over a month and has not been filled since 12/2018. Will add Buspar 5 mg TID prn.  - busPIRone (BUSPAR) 5 MG tablet; Take 1 tablet (5 mg total) by mouth 3 (three) times as needed  Dispense: 90 tablet; Refill: 2 - CMP14+EGFR; Future - CBC with Differential/Platelet; Future  6. Moderate episode of recurrent major depressive disorder (HCC) - CMP14+EGFR; Future - CBC with Differential/Platelet; Future  7. Morbid obesity ([place]) - CMP14+EGFR; Future - CBC with Differential/Platelet; Future  8. Atrial fibrillation, unspecified type ([place]) - CMP14+EGFR; Future - CBC with Differential/Platelet; Future  9. Pulmonary fibrosis (HCC) - CMP14+EGFR; Future - CBC with Differential/Platelet; Future  10. Chronic obstructive pulmonary disease, unspecified COPD type ([place]) - CMP14+EGFR; Future - CBC with Differential/Platelet; Future  11. Osteoarthritis of multiple joints, unspecified osteoarthritis type - CMP14+EGFR; Future - CBC with Differential/Platelet; Future  12. Slow transit constipation - CMP14+EGFR; Future - CBC with Differential/Platelet; Future  13. Pulmonary hypertension ([place])   Labs pending Health Maintenance reviewed Diet and exercise encouraged  Follow up plan: 3 months      I discussed the assessment and treatment plan with the patient. The patient was provided an opportunity to ask questions and all were answered. The patient  agreed with the plan and demonstrated an understanding of the instructions.   The patient was advised to call back or seek an in-person evaluation if the symptoms worsen or if the condition fails to improve as anticipated.  The above assessment and management plan was discussed with the patient. The patient verbalized understanding of and has agreed to the management plan. Patient is aware to call the clinic if symptoms persist or worsen. Patient is aware when to return to the clinic for a follow-up visit. Patient educated on when it is appropriate to go to the emergency department.   Time call ended: 2:45 pm     I provided 20 minutes of non-face-to-face time during this encounter.    Evelina Dun, FNP

## 2019-05-03 ENCOUNTER — Other Ambulatory Visit: Payer: Medicare Other

## 2019-05-03 ENCOUNTER — Other Ambulatory Visit: Payer: Self-pay

## 2019-05-03 DIAGNOSIS — E1169 Type 2 diabetes mellitus with other specified complication: Secondary | ICD-10-CM

## 2019-05-03 DIAGNOSIS — J8410 Pulmonary fibrosis, unspecified: Secondary | ICD-10-CM

## 2019-05-03 DIAGNOSIS — I152 Hypertension secondary to endocrine disorders: Secondary | ICD-10-CM

## 2019-05-03 DIAGNOSIS — E1142 Type 2 diabetes mellitus with diabetic polyneuropathy: Secondary | ICD-10-CM

## 2019-05-03 DIAGNOSIS — K5901 Slow transit constipation: Secondary | ICD-10-CM

## 2019-05-03 DIAGNOSIS — J449 Chronic obstructive pulmonary disease, unspecified: Secondary | ICD-10-CM

## 2019-05-03 DIAGNOSIS — F419 Anxiety disorder, unspecified: Secondary | ICD-10-CM

## 2019-05-03 DIAGNOSIS — E039 Hypothyroidism, unspecified: Secondary | ICD-10-CM

## 2019-05-03 DIAGNOSIS — I4891 Unspecified atrial fibrillation: Secondary | ICD-10-CM

## 2019-05-03 DIAGNOSIS — F331 Major depressive disorder, recurrent, moderate: Secondary | ICD-10-CM

## 2019-05-03 DIAGNOSIS — M159 Polyosteoarthritis, unspecified: Secondary | ICD-10-CM

## 2019-05-03 DIAGNOSIS — E1159 Type 2 diabetes mellitus with other circulatory complications: Secondary | ICD-10-CM

## 2019-05-03 LAB — BAYER DCA HB A1C WAIVED: HB A1C (BAYER DCA - WAIVED): 6.8 % (ref <7.0)

## 2019-05-04 LAB — CBC WITH DIFFERENTIAL/PLATELET
Basophils Absolute: 0.1 10*3/uL (ref 0.0–0.2)
Basos: 1 %
EOS (ABSOLUTE): 0 10*3/uL (ref 0.0–0.4)
Eos: 0 %
Hematocrit: 35.3 % (ref 34.0–46.6)
Hemoglobin: 11.3 g/dL (ref 11.1–15.9)
Immature Grans (Abs): 0 10*3/uL (ref 0.0–0.1)
Immature Granulocytes: 0 %
Lymphocytes Absolute: 1.9 10*3/uL (ref 0.7–3.1)
Lymphs: 18 %
MCH: 27.2 pg (ref 26.6–33.0)
MCHC: 32 g/dL (ref 31.5–35.7)
MCV: 85 fL (ref 79–97)
Monocytes Absolute: 0.6 10*3/uL (ref 0.1–0.9)
Monocytes: 6 %
Neutrophils Absolute: 7.8 10*3/uL — ABNORMAL HIGH (ref 1.4–7.0)
Neutrophils: 75 %
Platelets: 270 10*3/uL (ref 150–450)
RBC: 4.15 x10E6/uL (ref 3.77–5.28)
RDW: 13.6 % (ref 11.7–15.4)
WBC: 10.4 10*3/uL (ref 3.4–10.8)

## 2019-05-04 LAB — CMP14+EGFR
ALT: 17 IU/L (ref 0–32)
AST: 31 IU/L (ref 0–40)
Albumin/Globulin Ratio: 1.2 (ref 1.2–2.2)
Albumin: 4.1 g/dL (ref 3.7–4.7)
Alkaline Phosphatase: 82 IU/L (ref 39–117)
BUN/Creatinine Ratio: 28 (ref 12–28)
BUN: 33 mg/dL — ABNORMAL HIGH (ref 8–27)
Bilirubin Total: 0.3 mg/dL (ref 0.0–1.2)
CO2: 26 mmol/L (ref 20–29)
Calcium: 9.9 mg/dL (ref 8.7–10.3)
Chloride: 97 mmol/L (ref 96–106)
Creatinine, Ser: 1.16 mg/dL — ABNORMAL HIGH (ref 0.57–1.00)
GFR calc Af Amer: 52 mL/min/{1.73_m2} — ABNORMAL LOW (ref >59)
GFR calc non Af Amer: 45 mL/min/{1.73_m2} — ABNORMAL LOW (ref >59)
Globulin, Total: 3.5 g/dL (ref 1.5–4.5)
Glucose: 161 mg/dL — ABNORMAL HIGH (ref 65–99)
Potassium: 4.9 mmol/L (ref 3.5–5.2)
Sodium: 139 mmol/L (ref 134–144)
Total Protein: 7.6 g/dL (ref 6.0–8.5)

## 2019-05-04 LAB — LIPID PANEL
Chol/HDL Ratio: 3 ratio (ref 0.0–4.4)
Cholesterol, Total: 147 mg/dL (ref 100–199)
HDL: 49 mg/dL (ref >39)
LDL Chol Calc (NIH): 64 mg/dL (ref 0–99)
Triglycerides: 206 mg/dL — ABNORMAL HIGH (ref 0–149)
VLDL Cholesterol Cal: 34 mg/dL (ref 5–40)

## 2019-05-04 LAB — TSH: TSH: 0.538 u[IU]/mL (ref 0.450–4.500)

## 2019-05-07 ENCOUNTER — Other Ambulatory Visit: Payer: Self-pay | Admitting: Family

## 2019-05-09 ENCOUNTER — Ambulatory Visit (HOSPITAL_COMMUNITY)
Admission: RE | Admit: 2019-05-09 | Discharge: 2019-05-09 | Disposition: A | Discharge status: Home / Self Care | Payer: Medicare Other | Source: Ambulatory Visit | Attending: Acute Care | Admitting: Acute Care

## 2019-05-09 ENCOUNTER — Other Ambulatory Visit: Payer: Self-pay

## 2019-05-09 DIAGNOSIS — J849 Interstitial pulmonary disease, unspecified: Secondary | ICD-10-CM | POA: Diagnosis present

## 2019-05-11 ENCOUNTER — Telehealth: Payer: Self-pay | Admitting: Emergency Medicine

## 2019-05-12 ENCOUNTER — Other Ambulatory Visit: Payer: Self-pay | Admitting: Family

## 2019-05-12 NOTE — Telephone Encounter (Signed)
Last seen 04/29/19 Last filled 11/08/18 #20 with 2 refills

## 2019-05-13 ENCOUNTER — Encounter: Payer: Self-pay | Admitting: Emergency Medicine

## 2019-05-13 ENCOUNTER — Ambulatory Visit (INDEPENDENT_AMBULATORY_CARE_PROVIDER_SITE_OTHER): Payer: Medicare Other | Admitting: Emergency Medicine

## 2019-05-13 ENCOUNTER — Other Ambulatory Visit: Payer: Self-pay

## 2019-05-13 DIAGNOSIS — J8410 Pulmonary fibrosis, unspecified: Secondary | ICD-10-CM

## 2019-05-13 NOTE — Progress Notes (Signed)
Virtual Visit via Telephone Note  I connected with Lorraine Leonard on 05/13/19 at  2:15 PM EST by telephone and verified that I am speaking with the correct person using two identifiers.  Location: Patient: Home Provider: Office   I discussed the limitations, risks, security and privacy concerns of performing an evaluation and management service by telephone and the availability of in person appointments. I also discussed with the patient that there may be a patient responsible charge related to this service. The patient expressed understanding and agreed to proceed.   History of Present Illness: [name] is 2, has a history of hypoxemic respiratory failure in the setting of interstitial lung disease thought to be related to autoimmune process (question RA), superimposed obstructive lung disease and COPD.  She may also have obstructive sleep apnea but this is not been confirmed as she has not wanted a polysomnogram.  It appears that she is not been on maintenance prednisone therapy since August.  She was seen here in a phone visit earlier this month and treated with a prednisone taper for possible flare in either her COPD, ILD or both.  She was continued on Incruse and supplemental oxygen at 3 L/min by nasal cannula.   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 She reports that the pred taper helped her breathing - now feeling back to baseline. She is following w Dr Trudie Reed, started her on prednisone 10mg  which she is now taking daily. She is on Incruse daily. She has albuterol available is using 0-1x a day. She remains on flonase, xyzal. Good compliance w her O2.   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 She is going to communicate w Dr Trudie Reed and continue if she agrees Continue flonase and xyzal.   Follow Up Instructions: 6 months or prn   I discussed the assessment and treatment plan with the patient. The patient was provided an opportunity to ask questions and all were answered. The patient agreed with the plan and demonstrated an understanding of the instructions.   The patient was advised to call back or seek an in-person evaluation if the symptoms worsen or if the condition fails to improve as anticipated.  I provided 15 minutes of non-face-to-face time during this encounter.   Collene Gobble, MD

## 2019-05-16 ENCOUNTER — Telehealth: Payer: Self-pay | Admitting: Family

## 2019-05-16 ENCOUNTER — Ambulatory Visit (INDEPENDENT_AMBULATORY_CARE_PROVIDER_SITE_OTHER): Payer: Medicare Other | Admitting: Family Medicine

## 2019-05-16 DIAGNOSIS — J449 Chronic obstructive pulmonary disease, unspecified: Secondary | ICD-10-CM

## 2019-05-16 DIAGNOSIS — L501 Idiopathic urticaria: Secondary | ICD-10-CM

## 2019-05-16 DIAGNOSIS — M2550 Pain in unspecified joint: Secondary | ICD-10-CM

## 2019-05-16 DIAGNOSIS — J8410 Pulmonary fibrosis, unspecified: Secondary | ICD-10-CM | POA: Diagnosis not present

## 2019-05-16 MED ORDER — PREDNISONE 10 MG PO TABS: 10.0000 mg | ORAL_TABLET | Freq: Every day | ORAL | 0 refills | Status: DC

## 2019-05-16 NOTE — Progress Notes (Signed)
Telephone visit  Subjective: CC: Chronic shortness of breath PCP: Sharion Balloon, FNP UA:[id number] E Mccorkell is a 79 y.o. female calls for telephone consult today. Patient provides verbal consent for consult held via phone.  Due to COVID-19 pandemic this visit was conducted virtually. This visit type was conducted due to national recommendations for restrictions regarding the COVID-19 Pandemic (e.g. social distancing, sheltering in place) in an effort to limit this patient's exposure and mitigate transmission in our community. All issues noted in this document were discussed and addressed.  A physical exam was not performed with this format.   Location of patient: home Location of provider: [facility] Others present for call: none  1. Shortness of breath Patient with chronic and unchanged shortness of breath.  She has noticed some wheezing lately and discussed this with her pulmonologist recently.  She wants to stay on prednisone 10 mg daily, which was previously prescribed by her PCP.  Her pulmonologist defer this to her PCP for ongoing management.  Of note she reports chronic urticaria and polyarthralgia.  She has not seen a rheumatologist in many years.  She notes that the prednisone tends to help with all of these issues but she was unable to get a refill during her most recent pharmacy visit.  She is on oxygen at baseline.  She is also treated with an inhaled corticosteroid.   ROS: Per HPI  Allergies  Allergen Reactions  . Allopurinol Swelling    Feet and legs swell  . Mobic [Meloxicam] Swelling and Other (See Comments)    Feet and Leg swelling   Past Medical History:  Diagnosis Date  . Acute diastolic CHF (congestive heart failure) ([place]) 11/11/2016  . Allergic rhinitis   . Anxiety   . Anxiety   . Arthritis    "~ all my joints" (05/18/2017)  . Atrial fibrillation ([place]) 09/2016  . Atrial fibrillation with RVR ([place]) 05/18/2017  . Chronic lower back pain   . COPD (chronic obstructive  pulmonary disease) ([place])   . Depression   . Diverticulosis   . Dyspnea   . Dysrhythmia   . GERD (gastroesophageal reflux disease)   . Gout   . 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 Hyperlipidemia   . Hypertension   . Hypothyroidism   . Macular degeneration of both eyes   . Neuropathy   . On home oxygen therapy    "3L; 24/7" (05/18/2017)  . Pre-diabetes   . 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 Pulmonary fibrosis ([place])   . Pulmonary fibrosis ([place])   . Rheumatoid arthritis ([place])   . Sleep apnea    uses 3 liters of o2 24/7  . Urticaria     Current Outpatient Medications:  .  acetaminophen (TYLENOL) 500 MG tablet, Take 500 mg by mouth every 6 (six) hours as needed (pain)., Disp: , Rfl:  .  albuterol (PROVENTIL) (2.5 MG/3ML) 0.083% nebulizer solution, Take 3 mLs (2.5 mg total) by nebulization every 6 (six) hours as needed for wheezing or shortness of breath. J96.01, Disp: 150 mL, Rfl: 1 .  albuterol (VENTOLIN HFA) 108 (90 Base) MCG/ACT inhaler, Inhale 2 puffs into the lungs every 6 (six) hours as needed for wheezing or shortness of breath., Disp: 8.7 g, Rfl: 0 .  atenolol (TENORMIN) 50 MG tablet, Take 1 tablet (50 mg total) by mouth daily., Disp: 90 tablet, Rfl: 3 .  azelastine (ASTELIN) 0.1 % nasal spray, Place 2  sprays into both nostrils 2 (two) times daily. Use in each nostril as directed, Disp: 30 mL, Rfl: 12 .  busPIRone (BUSPAR) 5 MG tablet, Take 1 tablet (5 mg total) by mouth 3 (three) times daily as needed., Disp: 90 tablet, Rfl: 2 .  calcium citrate (CALCITRATE - DOSED IN MG ELEMENTAL CALCIUM) 950 MG tablet, Take 1 tablet by mouth 2 (two) times daily. , Disp: , Rfl:  .  cyanocobalamin 1000 MCG tablet, Take 1,000 mcg by mouth daily. Vitamin b12 every AM, Disp: , Rfl:  .  diclofenac sodium (VOLTAREN) 1 % GEL, Apply 2 g topically 4 (four) times daily. (Patient taking differently: Apply 2 g topically 4 (four) times daily. AS  NEEDED), Disp: 100 g, Rfl: 3 .  docusate sodium (COLACE) 100 MG capsule, Take 200 mg by mouth daily as needed for mild constipation., Disp: , Rfl:  .  ELIQUIS 5 MG TABS tablet, TAKE 1 TABLET BY MOUTH TWICE A DAY, Disp: 180 tablet, Rfl: 1 .  EPINEPHrine 0.3 mg/0.3 mL IJ SOAJ injection, Inject 0.3 mg into the muscle as needed for anaphylaxis., Disp: , Rfl:  .  ESTRACE VAGINAL 0.1 MG/GM vaginal cream, PLACE [id number] APPLICATORFUL VAGINALLY WEEKLY AS NEEDED AS DIRECTED, Disp: 42.5 g, Rfl: 1 .  famotidine (PEPCID) 20 MG tablet, Take 1 tablet (20 mg total) by mouth 2 (two) times daily for 14 days., Disp: 180 tablet, Rfl: 1 .  fluticasone (FLONASE) 50 MCG/ACT nasal spray, PLACE 1 SPRAY INTO BOTH NOSTRILS 2 (TWO) TIMES DAILY AS NEEDED FOR ALLERGIES OR RHINITIS. (Patient taking differently: Place 1 spray into both nostrils daily. ), Disp: 48 g, Rfl: 5 .  furosemide (LASIX) 40 MG tablet, Take 1 tablet (40 mg total) by mouth 2 (two) times daily., Disp: 180 tablet, Rfl: 3 .  INCRUSE ELLIPTA 62.5 MCG/INH AEPB, INHALE 1 PUFF INTO THE LUNGS DAILY. AS NEEDED, Disp: 30 each, Rfl: 0 .  levocetirizine (XYZAL) 5 MG tablet, TAKE 1 TABLET BY MOUTH EVERY DAY IN THE EVENING, Disp: 90 tablet, Rfl: 1 .  levothyroxine (SYNTHROID) 125 MCG tablet, TAKE 1 TABLET BY MOUTH EVERY DAY, Disp: 90 tablet, Rfl: 0 .  metFORMIN (GLUCOPHAGE-XR) 500 MG 24 hr tablet, TAKE 1 TABLET BY MOUTH EVERY DAY WITH BREAKFAST, Disp: 90 tablet, Rfl: 0 .  Multiple Vitamins-Minerals (PRESERVISION AREDS 2) CAPS, Take 1 capsule by mouth 2 (two) times daily., Disp: , Rfl:  .  Olopatadine HCl (PATADAY) 0.2 % SOLN, Place 1 drop into both eyes daily., Disp: 10 mL, Rfl: 5 .  omeprazole (PRILOSEC) 20 MG capsule, TAKE 1 CAPSULE BY MOUTH EVERY DAY, Disp: 90 capsule, Rfl: 1 .  ondansetron (ZOFRAN) 8 MG tablet, TAKE 1 TABLET (8 MG TOTAL) BY MOUTH EVERY 8 (EIGHT) HOURS AS NEEDED FOR NAUSEA OR VOMITING., Disp: 20 tablet, Rfl: 2 .  OXYGEN, Inhale 3 L into the lungs  continuous., Disp: , Rfl:  .  Polyethyl Glycol-Propyl Glycol (SYSTANE) 0.4-0.3 % GEL ophthalmic gel, Place 1 application into both eyes at bedtime as needed (dry eyes)., Disp: , Rfl:  .  rosuvastatin (CRESTOR) 10 MG tablet, TAKE 1 TABLET BY MOUTH EVERYDAY AT BEDTIME, Disp: 90 tablet, Rfl: 1 .  spironolactone (ALDACTONE) 25 MG tablet, TAKE 1 TABLET BY MOUTH EVERY DAY, Disp: 90 tablet, Rfl: 1 .  vitamin C (ASCORBIC ACID) 500 MG tablet, Take 1,000 mg by mouth daily. AM, Disp: , Rfl:  .  Vitamin D, Ergocalciferol, (DRISDOL) 1.25 MG (50000 UT) CAPS capsule, Take 1 capsule (50,000 Units  total) by mouth every 7 (seven) days., Disp: 12 capsule, Rfl: 1 .  vitamin E 400 UNIT capsule, Take 400 Units by mouth daily. AM, Disp: , Rfl:   Assessment/ Plan: 79 y.o. female   1. Pulmonary fibrosis ([place]) I reviewed her most recent pulmonology phone visit.  According to that visit there was concern for possible ILD to be related to an underlying autoimmune process.  They were questioning it rheumatoid arthritis superimposed with COPD and possible OSA.  From what I can understand from our telephone visit today she was being prescribed 10 mg of prednisone daily by her PCP previously.  She is wanting to go back on this for a number of reasons.  I have agreed to refill it x1 but would like her to follow-up with her PCP for ongoing refills and management, especially in the setting of type 2 diabetes.  I reviewed her last A1c which did indicate controlled type 2 diabetes.  She has a follow-up scheduled on 05/31/2019 with her PCP. - predniSONE (DELTASONE) 10 MG tablet; Take 1 tablet (10 mg total) by mouth daily with breakfast.  Dispense: 30 tablet; Refill: 0  2. Chronic obstructive pulmonary disease, unspecified COPD type ([place]) - predniSONE (DELTASONE) 10 MG tablet; Take 1 tablet (10 mg total) by mouth daily with breakfast.  Dispense: 30 tablet; Refill: 0  3. Idiopathic urticaria - predniSONE (DELTASONE) 10 MG tablet; Take 1  tablet (10 mg total) by mouth daily with breakfast.  Dispense: 30 tablet; Refill: 0  4. Polyarthralgia ?  Rheumatoid arthritis.  She has had positive RA latex in the past.  I cannot find previous rheumatology notes. - predniSONE (DELTASONE) 10 MG tablet; Take 1 tablet (10 mg total) by mouth daily with breakfast.  Dispense: 30 tablet; Refill: 0   Start time: 12:09pm End time: 12:16pm  Total time spent on patient care (including telephone call/ virtual visit): 20 minutes  [place], [place] [phone]

## 2019-05-30 ENCOUNTER — Other Ambulatory Visit: Payer: Self-pay

## 2019-05-31 ENCOUNTER — Ambulatory Visit: Payer: Medicare Other | Admitting: Family

## 2019-06-01 ENCOUNTER — Ambulatory Visit (INDEPENDENT_AMBULATORY_CARE_PROVIDER_SITE_OTHER): Payer: Medicare Other | Admitting: Family

## 2019-06-01 ENCOUNTER — Encounter: Payer: Self-pay | Admitting: Family

## 2019-06-01 ENCOUNTER — Other Ambulatory Visit: Payer: Self-pay

## 2019-06-01 DIAGNOSIS — F419 Anxiety disorder, unspecified: Secondary | ICD-10-CM

## 2019-06-01 MED ORDER — PREDNISONE 10 MG PO TABS: 10.0000 mg | ORAL_TABLET | Freq: Every day | ORAL | 0 refills | Status: DC

## 2019-06-01 MED ORDER — BUSPIRONE HCL 7.5 MG PO TABS: 7.5000 mg | ORAL_TABLET | Freq: Three times a day (TID) | ORAL | 1 refills | Status: DC

## 2019-06-01 NOTE — Progress Notes (Signed)
   Virtual Visit via telephone Note Due to COVID-19 pandemic this visit was conducted virtually. This visit type was conducted due to national recommendations for restrictions regarding the COVID-19 Pandemic (e.g. social distancing, sheltering in place) in an effort to limit this patient's exposure and mitigate transmission in our community. All issues noted in this document were discussed and addressed.  A physical exam was not performed with this format.  I connected with Lorraine Leonard on 06/01/19 at 11:40 AM  by telephone and verified that I am speaking with the correct person using two identifiers. Lorraine Leonard is currently located at home  and husband is currently with her during visit. The provider, Evelina Dun, FNP is located in their office at time of visit.  I discussed the limitations, risks, security and privacy concerns of performing an evaluation and management service by telephone and the availability of in person appointments. I also discussed with the patient that there may be a patient responsible charge related to this service. The patient expressed understanding and agreed to proceed.   History and Present Illness:  PT calls the office today for follow up on GAD. She was seen on 04/29/19 and we stopped her Valium and started on Buspar 5 mg . She reports this is helping her relax.  Anxiety Presents for follow-up visit. Symptoms include depressed mood, excessive worry, insomnia, irritability, nervous/anxious behavior, obsessions and restlessness. Symptoms occur most days. The severity of symptoms is moderate. The quality of sleep is good.        Review of Systems  Constitutional: Positive for irritability.  Psychiatric/Behavioral: The patient is nervous/anxious and has insomnia.   All other systems reviewed and are negative.    Observations/Objective: No SOB or distress noted  Assessment and Plan: 1. Anxiety Will increase Busapr to 7.5 mg from 5 mg Stress  management  Keep specialists follow up RTO in 3 months  - busPIRone (BUSPAR) 7.5 MG tablet; Take 1 tablet (7.5 mg total) by mouth 3 (three) times daily.  Dispense: 90 tablet; Refill: 1      I discussed the assessment and treatment plan with the patient. The patient was provided an opportunity to ask questions and all were answered. The patient agreed with the plan and demonstrated an understanding of the instructions.   The patient was advised to call back or seek an in-person evaluation if the symptoms worsen or if the condition fails to improve as anticipated.  The above assessment and management plan was discussed with the patient. The patient verbalized understanding of and has agreed to the management plan. Patient is aware to call the clinic if symptoms persist or worsen. Patient is aware when to return to the clinic for a follow-up visit. Patient educated on when it is appropriate to go to the emergency department.   Time call ended:  11:53AM  I provided 13 minutes of non-face-to-face time during this encounter.    Evelina Dun, FNP

## 2019-06-02 ENCOUNTER — Other Ambulatory Visit: Payer: Self-pay | Admitting: Family

## 2019-06-07 ENCOUNTER — Ambulatory Visit (INDEPENDENT_AMBULATORY_CARE_PROVIDER_SITE_OTHER): Payer: Medicare Other | Admitting: *Deleted

## 2019-06-07 DIAGNOSIS — I4891 Unspecified atrial fibrillation: Secondary | ICD-10-CM | POA: Diagnosis not present

## 2019-06-08 LAB — CUP PACEART REMOTE DEVICE CHECK
Battery Remaining Longevity: 121 mo
Battery Voltage: 2.95 V
Brady Statistic AP VP Percent: 1.15 %
Brady Statistic AP VS Percent: 36.79 %
Brady Statistic AS VP Percent: 0 %
Brady Statistic AS VS Percent: 62.06 %
Brady Statistic RA Percent Paced: 80.41 %
Brady Statistic RV Percent Paced: 1.15 %
Date Time Interrogation Session: 20210217021912
Implantable Lead Implant Date: 20190128
Implantable Lead Implant Date: 20190128
Implantable Lead Location: 753860
Implantable Lead Location: 753860
Implantable Lead Model: 3830
Implantable Lead Model: 5076
Implantable Pulse Generator Implant Date: 20190128
Lead Channel Impedance Value: 285 Ohm
Lead Channel Impedance Value: 361 Ohm
Lead Channel Impedance Value: 380 Ohm
Lead Channel Impedance Value: 494 Ohm
Lead Channel Sensing Intrinsic Amplitude: 11 mV
Lead Channel Sensing Intrinsic Amplitude: 11 mV
Lead Channel Sensing Intrinsic Amplitude: 2 mV
Lead Channel Sensing Intrinsic Amplitude: 2 mV
Lead Channel Setting Pacing Amplitude: 2.5 V
Lead Channel Setting Pacing Amplitude: 3.5 V
Lead Channel Setting Pacing Pulse Width: 0.4 ms
Lead Channel Setting Sensing Sensitivity: 0.9 mV

## 2019-06-12 ENCOUNTER — Other Ambulatory Visit: Payer: Self-pay | Admitting: Family

## 2019-06-12 ENCOUNTER — Other Ambulatory Visit: Payer: Self-pay | Admitting: Family Medicine

## 2019-06-12 DIAGNOSIS — J449 Chronic obstructive pulmonary disease, unspecified: Secondary | ICD-10-CM

## 2019-06-12 DIAGNOSIS — L501 Idiopathic urticaria: Secondary | ICD-10-CM

## 2019-06-12 DIAGNOSIS — J8410 Pulmonary fibrosis, unspecified: Secondary | ICD-10-CM

## 2019-06-15 ENCOUNTER — Other Ambulatory Visit: Payer: Self-pay | Admitting: Family

## 2019-06-16 ENCOUNTER — Telehealth: Payer: Self-pay

## 2019-06-16 NOTE — Telephone Encounter (Signed)
Virtual Visit Pre-Appointment Phone Call  "(Name), I am calling you today to discuss your upcoming appointment. We are currently trying to limit exposure to the virus that causes COVID-19 by seeing patients at home rather than in the office."  1. "What is the BEST phone number to call the day of the visit?" - include this in appointment notes  2. "Do you have or have access to (through a family member/friend) a smartphone with video capability that we can use for your visit?" a. If yes - list this number in appt notes as "cell" (if different from BEST phone #) and list the appointment type as a VIDEO visit in appointment notes b. If no - list the appointment type as a PHONE visit in appointment notes  Confirm consent - "In the setting of the current Covid19 crisis, you are scheduled for a (phone or video) visit with your provider on (date) at (time).  Just as we do with many in-office visits, in order for you to participate in this visit, we must obtain consent.  If you'd like, I can send this to your mychart (if signed up) or email for you to review.  Otherwise, I can obtain your verbal consent now.  All virtual visits are billed to your insurance company just like a normal visit would be.  By agreeing to a virtual visit, we'd like you to understand that the technology does not allow for your provider to perform an examination, and thus may limit your provider's ability to fully assess your condition. If your provider identifies any concerns that need to be evaluated in person, we will make arrangements to do so.  Finally, though the technology is pretty good, we cannot assure that it will always work on either your or our end, and in the setting of a video visit, we may have to convert it to a phone-only visit.  In either situation, we cannot ensure that we have a secure connection.  Are you willing to proceed?" STAFF: Did the patient verbally acknowledge consent to telehealth visit? Document  YES/NO here:  3. Advise patient to be prepared - "Two hours prior to your appointment, go ahead and check your blood pressure, pulse, oxygen saturation, and your weight (if you have the equipment to check those) and write them all down. When your visit starts, your provider will ask you for this information. If you have an Apple Watch or Kardia device, please plan to have heart rate information ready on the day of your appointment. Please have a pen and paper handy nearby the day of the visit as well."  4. Give patient instructions for MyChart download to smartphone OR Doximity/Doxy.me as below if video visit (depending on what platform provider is using)  5. Inform patient they will receive a phone call 15 minutes prior to their appointment time (may be from unknown caller ID) so they should be prepared to answer    TELEPHONE CALL NOTE  Lorraine Leonard has been deemed a candidate for a follow-up tele-health visit to limit community exposure during the Covid-19 pandemic. I spoke with the patient via phone to ensure availability of phone/video source, confirm preferred email & phone number, and discuss instructions and expectations.  I reminded Lorraine Leonard to be prepared with any vital sign and/or heart rhythm information that could potentially be obtained via home monitoring, at the time of her visit. I reminded Lorraine Leonard to expect a phone call prior to her visit.  Dorothey Baseman 06/16/2019 2:08 PM   INSTRUCTIONS FOR DOWNLOADING THE MYCHART APP TO SMARTPHONE  - The patient must first make sure to have activated MyChart and know their login information - If Apple, go to [employer] and type in MyChart in the search bar and download the app. If Android, ask patient to go to [employer] and type in [place] in the search bar and download the app. The app is free but as with any other app downloads, their phone may require them to verify saved payment information or Apple/Android password.    - The patient will need to then log into the app with their MyChart username and password, and select [facility] as their healthcare provider to link the account. When it is time for your visit, go to the MyChart app, find appointments, and click Begin Video Visit. Be sure to Select Allow for your device to access the Microphone and Camera for your visit. You will then be connected, and your provider will be with you shortly.  **If they have any issues connecting, or need assistance please contact MyChart service desk [phone])**  **If using a computer, in order to ensure the best quality for their visit they will need to use either of the following Internet Browsers: [employer], or Google Chrome**  IF USING DOXIMITY or DOXY.ME - The patient will receive a link just prior to their visit by text.     FULL LENGTH CONSENT FOR TELE-HEALTH VISIT   I hereby voluntarily request, consent and authorize [facility] and its employed or contracted physicians, physician assistants, nurse practitioners or other licensed health care professionals (the Practitioner), to provide me with telemedicine health care services (the "Services") as deemed necessary by the treating Practitioner. I acknowledge and consent to receive the Services by the Practitioner via telemedicine. I understand that the telemedicine visit will involve communicating with the Practitioner through live audiovisual communication technology and the disclosure of certain medical information by electronic transmission. I acknowledge that I have been given the opportunity to request an in-person assessment or other available alternative prior to the telemedicine visit and am voluntarily participating in the telemedicine visit.  I understand that I have the right to withhold or withdraw my consent to the use of telemedicine in the course of my care at any time, without affecting my right to future care or treatment, and that  the Practitioner or I may terminate the telemedicine visit at any time. I understand that I have the right to inspect all information obtained and/or recorded in the course of the telemedicine visit and may receive copies of available information for a reasonable fee.  I understand that some of the potential risks of receiving the Services via telemedicine include:  Marland Kitchen Delay or interruption in medical evaluation due to technological equipment failure or disruption; . Information transmitted may not be sufficient (e.g. poor resolution of images) to allow for appropriate medical decision making by the Practitioner; and/or  . In rare instances, security protocols could fail, causing a breach of personal health information.  Furthermore, I acknowledge that it is my responsibility to provide information about my medical history, conditions and care that is complete and accurate to the best of my ability. I acknowledge that Practitioner's advice, recommendations, and/or decision may be based on factors not within their control, such as incomplete or inaccurate data provided by me or distortions of diagnostic images or specimens that may result from electronic transmissions. I understand that the  practice of medicine is not an Chief Strategy Officer and that Practitioner makes no warranties or guarantees regarding treatment outcomes. I acknowledge that I will receive a copy of this consent concurrently upon execution via email to the email address I last provided but may also request a printed copy by calling the office of [facility].    I understand that my insurance will be billed for this visit.   I have read or had this consent read to me. . I understand the contents of this consent, which adequately explains the benefits and risks of the Services being provided via telemedicine.  . I have been provided ample opportunity to ask questions regarding this consent and the Services and have had my questions answered to  my satisfaction. . I give my informed consent for the services to be provided through the use of telemedicine in my medical care  By participating in this telemedicine visit I agree to the above.

## 2019-06-22 ENCOUNTER — Telehealth (INDEPENDENT_AMBULATORY_CARE_PROVIDER_SITE_OTHER): Payer: Medicare Other | Admitting: Cardiology

## 2019-06-22 ENCOUNTER — Encounter: Payer: Self-pay | Admitting: Cardiology

## 2019-06-22 VITALS — Ht 68.0 in | Wt 228.0 lb

## 2019-06-22 DIAGNOSIS — I4891 Unspecified atrial fibrillation: Secondary | ICD-10-CM | POA: Diagnosis not present

## 2019-06-22 DIAGNOSIS — I5032 Chronic diastolic (congestive) heart failure: Secondary | ICD-10-CM

## 2019-06-22 NOTE — Progress Notes (Signed)
Virtual Visit via Telephone Note   This visit type was conducted due to national recommendations for restrictions regarding the COVID-19 Pandemic (e.g. social distancing) in an effort to limit this patient's exposure and mitigate transmission in our community.  Due to her co-morbid illnesses, this patient is at least at moderate risk for complications without adequate follow up.  This format is felt to be most appropriate for this patient at this time.  The patient did not have access to video technology/had technical difficulties with video requiring transitioning to audio format only (telephone).  All issues noted in this document were discussed and addressed.  No physical exam could be performed with this format.  Please refer to the patient's chart for her  consent to telehealth for [facility].   Date:  06/22/2019   ID:  Lorraine Leonard, DOB 08/25/1940, MRN [id number]  Patient Location: Home Provider Location: Office  PCP:  Sharion Balloon, FNP  Cardiologist:  Carlyle Dolly, MD  Electrophysiologist:  Cristopher Peru, MD   Evaluation Performed:  Follow-Up Visit  Chief Complaint:  Follow up visit  History of Present Illness:    Lorraine Leonard is a 79 y.o. female seen today for follow up of the following medical problems.  1. Afib - followed by EP. Failed rate control due to bradycardia. Unable to maintain rhythm control - sheis s/p AV node ablation with pacemaker placement by EP.  - isolated episode of palpitations - compliant with meds - no bleeding on eliquis.  - 02/2018 visit with Dr Lovena Le metoprolol stopped, started on digoxin.  -no recent bleeding. No significant palpitations.   05/2019 normal device check - no recent palpitations. No bleeding on eliquis.     2.Chronic diastolic HF - [id number] echo LVEF 55-60%, no WMAs - not checking home weights. By clinic weights up 8 lbs since last month. Has had some increased LE edema, some increased DOE  - no  recent edema - compliant with lasix 40mg  bid - weights stable around 228 lbs.    3. Pulmonary fibrosis - followed by pulmionary - normally 3 L 24 hrs per day.  - chronic SOB that is stable, remains on 3L at home.       The patient does not have symptoms concerning for COVID-19 infection (fever, chills, cough, or new shortness of breath).    Past Medical History:  Diagnosis Date  . Acute diastolic CHF (congestive heart failure) ([place]) 11/11/2016  . Allergic rhinitis   . Anxiety   . Anxiety   . Arthritis    "~ all my joints" (05/18/2017)  . Atrial fibrillation ([place]) 09/2016  . Atrial fibrillation with RVR ([place]) 05/18/2017  . Chronic lower back pain   . COPD (chronic obstructive pulmonary disease) ([place])   . Depression   . Diverticulosis   . Dyspnea   . Dysrhythmia   . GERD (gastroesophageal reflux disease)   . Gout   . 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 Hyperlipidemia   . Hypertension   . Hypothyroidism   . Macular degeneration of both eyes   . Neuropathy   . On home oxygen therapy    "3L; 24/7" (05/18/2017)  . Pre-diabetes   . 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 Pulmonary fibrosis ([place])   . Pulmonary fibrosis ([place])   . Rheumatoid arthritis ([place])   . Sleep apnea    uses 3 liters of o2 24/7  . Urticaria  Past Surgical History:  Procedure Laterality Date  . AV NODE ABLATION  05/18/2017  . AV NODE ABLATION N/A 05/18/2017   Procedure: AV NODE ABLATION;  Surgeon: Evans Lance, MD;  Location: [facility];  Service: Cardiovascular;  Laterality: N/A;  . BACK SURGERY    . BREAST LUMPECTOMY Left   . CARDIOVERSION N/A 01/30/2017   Procedure: CARDIOVERSION;  Surgeon: Josue Hector, MD;  [facility];  Service: Cardiovascular;  Laterality: N/A;  . CARDIOVERSION N/A 04/09/2017   Procedure: CARDIOVERSION;  Surgeon: Satira Sark, MD;  Location: [facility];  Service: Cardiovascular;   Laterality: N/A;  . CATARACT EXTRACTION W/ INTRAOCULAR LENS  IMPLANT, BILATERAL Bilateral   . DILATION AND CURETTAGE OF UTERUS    . EUS N/A 10/03/2015   Procedure: UPPER ENDOSCOPIC ULTRASOUND (EUS) RADIAL;  Surgeon: Arta Silence, MD;  Location: [facility];  Service: Endoscopy;  Laterality: N/A;  . FINE NEEDLE ASPIRATION N/A 10/03/2015   Procedure: FINE NEEDLE ASPIRATION (FNA) RADIAL;  Surgeon: Arta Silence, MD;  Location: [facility];  Service: Endoscopy;  Laterality: N/A;  . INSERT / REPLACE / REMOVE PACEMAKER  05/18/2017  . LUMBAR LAMINECTOMY  1995; 06/23/2008   L4-5/notes 08/20/2010  . 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 PACEMAKER IMPLANT N/A 05/18/2017   Procedure: PACEMAKER IMPLANT;  Surgeon: Evans Lance, MD;  Location: [facility];  Service: Cardiovascular;  Laterality: N/A;  . TOTAL ABDOMINAL HYSTERECTOMY    . TUBAL LIGATION       No outpatient medications have been marked as taking for the 06/22/19 encounter (Appointment) with Arnoldo Lenis, MD.     Allergies:   Allopurinol and Mobic [meloxicam]   Social History   Tobacco Use  . Smoking status: Never Smoker  . Smokeless tobacco: Never Used  Substance Use Topics  . Alcohol use: No  . Drug use: No     Family Hx: The patient's family history includes Anxiety disorder in an other family member; Asthma in an other family member; CVA in her son; Cancer in her sister, sister, sister, and sister; Coronary artery disease in an other family member; Diabetes in an other family member; Healthy in her brother and sister; Heart failure in her father; Hyperlipidemia in an other family member; Hypertension in an other family member; Kidney disease in her sister; Lung cancer in her sister and sister; Migraines in an other family member; Stroke in an other family member; Suicidality in her son.  ROS:   Please see the history of present illness.     All other systems reviewed and are  negative.   Prior CV studies:   The following studies were reviewed today:  11/2015 echo Study Conclusions  - Left ventricle: The cavity size was normal. There was severe focal basal hypertrophy of the septum. Systolic function was vigorous. The estimated ejection fraction was in the range of 65% to 70%. Wall motion was normal; there were no regional wall motion abnormalities. - Mitral valve: Moderately calcified annulus. There was mild regurgitation. - Left atrium: Volume/bsa, ES (1-plane Simpson&'s, A4C): 26.5 ml/m^2. - Right ventricle: The cavity size was normal. Wall thickness was mildly increased. - Pulmonary arteries: Systolic pressure was within the normal range.   09/2016 echo Study Conclusions  - Left ventricle: The cavity size was normal. Wall thickness was increased in a pattern of mild LVH. Systolic function was normal. The estimated ejection fraction was in the range  of 60% to 65%. Wall motion was normal; there were no regional wall motion abnormalities. Features are consistent with a pseudonormal left ventricular filling pattern, with concomitant abnormal relaxation and increased filling pressure (grade 2 diastolic dysfunction). - Aortic valve: Mildly calcified annulus. Trileaflet. - Mitral valve: Calcified annulus. - Right atrium: Central venous pressure (est): 3 mm Hg. - Atrial septum: No defect or patent foramen ovale was identified. - Tricuspid valve: There was trivial regurgitation. - Pulmonary arteries: PA peak pressure: 25 mm Hg (S). - Pericardium, extracardiac: A prominent pericardial fat pad was present.  Impressions:  - Mild LVH with LVEF 60-65% and grade 2 diastolic dysfunction. Calcified mitral annulus. Mildly sclerotic aortic valve. Trivial tricuspid regurgitation with normal PASP estimated 25 mmHg. Prominent pericardial fat pad noted.   Labs/Other Tests and Data Reviewed:    EKG:  No ECG  reviewed.  Recent Labs: 05/03/2019: ALT 17; BUN 33; Creatinine, Ser 1.16; Hemoglobin 11.3; Platelets 270; Potassium 4.9; Sodium 139; TSH 0.538   Recent Lipid Panel Lab Results  Component Value Date/Time   CHOL 147 05/03/2019 01:07 PM   TRIG 206 (H) 05/03/2019 01:07 PM   TRIG 169 (H) 08/17/2014 11:36 AM   HDL 49 05/03/2019 01:07 PM   HDL 44 08/17/2014 11:36 AM   CHOLHDL 3.0 05/03/2019 01:07 PM   CHOLHDL 3.1 02/13/2017 04:30 AM   LDLCALC 64 05/03/2019 01:07 PM    Wt Readings from Last 3 Encounters:  04/05/19 229 lb (103.9 kg)  01/27/19 229 lb (103.9 kg)  12/08/18 227 lb (103 kg)     Objective:    Vital Signs:   Today's Vitals   06/22/19 1237  Weight: 228 lb (103.4 kg)  Height: 5\' 8"  (1.727 m)   Body mass index is 34.67 kg/m. Normal affect. Normal speech pattern and tone. Comfortable, no apparent distress. No audible signs of SOB or wheezing.   ASSESSMENT & PLAN:    1.Afib - no symptoms, continue current meds including anticoag.   2.Chronic diastolic HF - no edema, chronic stable SOB related to her pulmonary fibrosis, she is on home O2. Weights stable over the last several months - continue current lasix dosing     F/u 6 months  COVID-19 Education: The signs and symptoms of COVID-19 were discussed with the patient and how to seek care for testing (follow up with PCP or arrange E-visit).  The importance of social distancing was discussed today.  Time:   Today, I have spent 22 minutes with the patient with telehealth technology discussing the above problems.     Medication Adjustments/Labs and Tests Ordered: Current medicines are reviewed at length with the patient today.  Concerns regarding medicines are outlined above.   Tests Ordered: No orders of the defined types were placed in this encounter.   Medication Changes: No orders of the defined types were placed in this encounter.   Follow Up:  Either In Person or Virtual in 6  month(s)  Signed, Carlyle Dolly, MD  06/22/2019 10:56 AM    [place]

## 2019-06-22 NOTE — Telephone Encounter (Signed)
Has not had Vit D draw in over a year please advise.

## 2019-06-22 NOTE — Patient Instructions (Signed)

## 2019-06-23 ENCOUNTER — Other Ambulatory Visit: Payer: Self-pay | Admitting: Family

## 2019-06-23 MED ORDER — VITAMIN D (ERGOCALCIFEROL) 1.25 MG (50000 UNIT) PO CAPS: 50000.0000 [IU] | ORAL_CAPSULE | ORAL | 1 refills | Status: DC

## 2019-06-24 ENCOUNTER — Other Ambulatory Visit: Payer: Self-pay | Admitting: Family

## 2019-06-24 DIAGNOSIS — F419 Anxiety disorder, unspecified: Secondary | ICD-10-CM

## 2019-06-25 ENCOUNTER — Other Ambulatory Visit: Payer: Self-pay | Admitting: Family

## 2019-06-25 DIAGNOSIS — K219 Gastro-esophageal reflux disease without esophagitis: Secondary | ICD-10-CM

## 2019-06-26 DIAGNOSIS — J449 Chronic obstructive pulmonary disease, unspecified: Secondary | ICD-10-CM | POA: Diagnosis not present

## 2019-06-26 DIAGNOSIS — J8410 Pulmonary fibrosis, unspecified: Secondary | ICD-10-CM | POA: Diagnosis not present

## 2019-06-26 DIAGNOSIS — I509 Heart failure, unspecified: Secondary | ICD-10-CM | POA: Diagnosis not present

## 2019-07-07 ENCOUNTER — Encounter (INDEPENDENT_AMBULATORY_CARE_PROVIDER_SITE_OTHER): Payer: Medicare Other | Admitting: Ophthalmology

## 2019-07-27 DIAGNOSIS — I509 Heart failure, unspecified: Secondary | ICD-10-CM | POA: Diagnosis not present

## 2019-07-27 DIAGNOSIS — J8410 Pulmonary fibrosis, unspecified: Secondary | ICD-10-CM | POA: Diagnosis not present

## 2019-07-27 DIAGNOSIS — J449 Chronic obstructive pulmonary disease, unspecified: Secondary | ICD-10-CM | POA: Diagnosis not present

## 2019-08-01 ENCOUNTER — Other Ambulatory Visit: Payer: Self-pay | Admitting: Family

## 2019-08-05 ENCOUNTER — Other Ambulatory Visit: Payer: Self-pay | Admitting: Family

## 2019-08-05 DIAGNOSIS — J019 Acute sinusitis, unspecified: Secondary | ICD-10-CM

## 2019-08-26 DIAGNOSIS — J8410 Pulmonary fibrosis, unspecified: Secondary | ICD-10-CM | POA: Diagnosis not present

## 2019-08-26 DIAGNOSIS — J449 Chronic obstructive pulmonary disease, unspecified: Secondary | ICD-10-CM | POA: Diagnosis not present

## 2019-08-26 DIAGNOSIS — I509 Heart failure, unspecified: Secondary | ICD-10-CM | POA: Diagnosis not present

## 2019-08-29 ENCOUNTER — Telehealth: Payer: Self-pay | Admitting: *Deleted

## 2019-08-29 NOTE — Telephone Encounter (Signed)
VM from [facility] Pt has appt with Alyse Low tomorrow 08/30/19 2 things she would like brought to providers attention before appt Pt is c/o black stools, she is on eliquis, possible referral to GI She is not taking her Buspirone it gives her a headache, suggesting Zoloft qpm

## 2019-08-30 ENCOUNTER — Other Ambulatory Visit: Payer: Self-pay

## 2019-08-30 ENCOUNTER — Encounter: Payer: Self-pay | Admitting: Family

## 2019-08-30 ENCOUNTER — Ambulatory Visit (INDEPENDENT_AMBULATORY_CARE_PROVIDER_SITE_OTHER): Payer: Medicare Other | Admitting: Family

## 2019-08-30 VITALS — BP 107/62 | HR 73 | Temp 97.8°F | Ht 68.0 in | Wt 231.8 lb

## 2019-08-30 DIAGNOSIS — I152 Hypertension secondary to endocrine disorders: Secondary | ICD-10-CM

## 2019-08-30 DIAGNOSIS — M159 Polyosteoarthritis, unspecified: Secondary | ICD-10-CM

## 2019-08-30 DIAGNOSIS — I480 Paroxysmal atrial fibrillation: Secondary | ICD-10-CM

## 2019-08-30 DIAGNOSIS — K219 Gastro-esophageal reflux disease without esophagitis: Secondary | ICD-10-CM

## 2019-08-30 DIAGNOSIS — I4891 Unspecified atrial fibrillation: Secondary | ICD-10-CM | POA: Diagnosis not present

## 2019-08-30 DIAGNOSIS — E1142 Type 2 diabetes mellitus with diabetic polyneuropathy: Secondary | ICD-10-CM | POA: Diagnosis not present

## 2019-08-30 DIAGNOSIS — E039 Hypothyroidism, unspecified: Secondary | ICD-10-CM

## 2019-08-30 DIAGNOSIS — J8410 Pulmonary fibrosis, unspecified: Secondary | ICD-10-CM | POA: Diagnosis not present

## 2019-08-30 DIAGNOSIS — E1159 Type 2 diabetes mellitus with other circulatory complications: Secondary | ICD-10-CM

## 2019-08-30 DIAGNOSIS — J449 Chronic obstructive pulmonary disease, unspecified: Secondary | ICD-10-CM

## 2019-08-30 DIAGNOSIS — F419 Anxiety disorder, unspecified: Secondary | ICD-10-CM

## 2019-08-30 DIAGNOSIS — E1169 Type 2 diabetes mellitus with other specified complication: Secondary | ICD-10-CM

## 2019-08-30 DIAGNOSIS — Z23 Encounter for immunization: Secondary | ICD-10-CM

## 2019-08-30 DIAGNOSIS — E785 Hyperlipidemia, unspecified: Secondary | ICD-10-CM

## 2019-08-30 DIAGNOSIS — I10 Essential (primary) hypertension: Secondary | ICD-10-CM | POA: Diagnosis not present

## 2019-08-30 DIAGNOSIS — R195 Other fecal abnormalities: Secondary | ICD-10-CM

## 2019-08-30 DIAGNOSIS — N182 Chronic kidney disease, stage 2 (mild): Secondary | ICD-10-CM

## 2019-08-30 LAB — BAYER DCA HB A1C WAIVED: HB A1C (BAYER DCA - WAIVED): 7.2 % — ABNORMAL HIGH (ref <7.0)

## 2019-08-30 MED ORDER — SERTRALINE HCL 50 MG PO TABS: 50.0000 mg | ORAL_TABLET | Freq: Every day | ORAL | 1 refills | Status: DC

## 2019-08-30 NOTE — Patient Instructions (Signed)

## 2019-08-30 NOTE — Progress Notes (Signed)
Subjective:    Patient ID: Lorraine Leonard, female    DOB: [birth year], 79 y.o.   MRN: [id number]  Chief Complaint  Patient presents with  . Medical Management of Chronic Issues    Not taking Busbar   Pt presents to the office today for chronic follow up. She is followed by Cardiologists every 6 months for CHF &A Fib. She is followed by Pulmonologist 6 months for pulmonary hypertension and Pulmonary Fibrosis. She is on continuous 3 l of O2. She reports her breathing is stable.   She reports she has intermittent dark stools on and off and thinks this may be related to her Vit D. Denies any blood. Will check CBC today.  Diabetes She presents for her follow-up diabetic visit. She has type 2 diabetes mellitus. Her disease course has been stable. Hypoglycemia symptoms include nervousness/anxiousness. Associated symptoms include blurred vision, fatigue and foot paresthesias. There are no hypoglycemic complications. Symptoms are stable. Diabetic complications include heart disease, nephropathy and peripheral neuropathy. Risk factors for coronary artery disease include dyslipidemia, diabetes mellitus, hypertension, sedentary lifestyle and post-menopausal. She is following a generally unhealthy diet. Her overall blood glucose range is 90-110 mg/dl. Eye exam is current.  Hypertension This is a chronic problem. The current episode started more than 1 year ago. The problem has been resolved since onset. The problem is controlled. Associated symptoms include anxiety, blurred vision, malaise/fatigue and shortness of breath. Pertinent negatives include no peripheral edema. Risk factors for coronary artery disease include dyslipidemia, diabetes mellitus, obesity and sedentary lifestyle. The current treatment provides moderate improvement. Hypertensive end-organ damage includes CAD/MI. Identifiable causes of hypertension include a thyroid problem.  Congestive Heart Failure Presents for follow-up visit. Associated  symptoms include fatigue, orthopnea and shortness of breath. Pertinent negatives include no unexpected weight change. The symptoms have been stable.  Gastroesophageal Reflux She complains of belching and heartburn. This is a chronic problem. The current episode started more than 1 year ago. The problem occurs rarely. The problem has been waxing and waning. Associated symptoms include fatigue and orthopnea. Risk factors include obesity. She has tried a histamine-2 antagonist for the symptoms. The treatment provided moderate relief.  Thyroid Problem Presents for follow-up visit. Symptoms include anxiety, constipation, depressed mood, diarrhea and fatigue. The symptoms have been stable. Her past medical history is significant for hyperlipidemia.  Hyperlipidemia This is a chronic problem. The current episode started more than 1 year ago. The problem is controlled. Recent lipid tests were reviewed and are normal. Exacerbating diseases include obesity. Associated symptoms include shortness of breath. Current antihyperlipidemic treatment includes statins. The current treatment provides moderate improvement of lipids. Risk factors for coronary artery disease include dyslipidemia, diabetes mellitus, hypertension, a sedentary lifestyle and post-menopausal.  Arthritis Presents for follow-up visit. She complains of pain and stiffness. The symptoms have been stable. Affected locations include the right MCP, left MCP, left shoulder, right shoulder, right wrist, left wrist, right hip and left hip. Her pain is at a severity of 8/10. Associated symptoms include diarrhea and fatigue.  Anxiety Presents for follow-up visit. Symptoms include decreased concentration, depressed mood, excessive worry, irritability, nervous/anxious behavior, restlessness and shortness of breath. Symptoms occur occasionally. The severity of symptoms is moderate.    Depression        This is a chronic problem.  The current episode started more  than 1 year ago.   The problem occurs intermittently.  Associated symptoms include decreased concentration, fatigue and restlessness.  Past medical history includes thyroid problem  and anxiety.       Review of Systems  Constitutional: Positive for fatigue, irritability and malaise/fatigue. Negative for unexpected weight change.  Eyes: Positive for blurred vision.  Respiratory: Positive for shortness of breath.   Gastrointestinal: Positive for constipation, diarrhea and heartburn.  Musculoskeletal: Positive for arthritis and stiffness.  Psychiatric/Behavioral: Positive for decreased concentration and depression. The patient is nervous/anxious.   All other systems reviewed and are negative.      Objective:   Physical Exam Vitals reviewed.  Constitutional:      General: She is not in acute distress.    Appearance: She is well-developed. She is obese.  HENT:     Head: Normocephalic and atraumatic.     Right Ear: Tympanic membrane normal.     Left Ear: Tympanic membrane normal.  Eyes:     Pupils: Pupils are equal, round, and reactive to light.  Neck:     Thyroid: No thyromegaly.  Cardiovascular:     Rate and Rhythm: Normal rate and regular rhythm.     Heart sounds: Normal heart sounds. No murmur.  Pulmonary:     Effort: Pulmonary effort is normal. No respiratory distress.     Breath sounds: Normal breath sounds. No wheezing.  Abdominal:     General: Bowel sounds are normal. There is no distension.     Palpations: Abdomen is soft.     Tenderness: There is no abdominal tenderness.  Musculoskeletal:        General: No tenderness. Normal range of motion.     Cervical back: Normal range of motion and neck supple.  Skin:    General: Skin is warm and dry.  Neurological:     Mental Status: She is alert and oriented to person, place, and time.     Cranial Nerves: No cranial nerve deficit.     Deep Tendon Reflexes: Reflexes are normal and symmetric.  Psychiatric:        Behavior:  Behavior normal.        Thought Content: Thought content normal.        Judgment: Judgment normal.       BP 107/62   Pulse 73   Temp 97.8 F (36.6 C) (Temporal)   Ht '5\' 8"'$  (1.727 m)   Wt 231 lb 12.8 oz (105.1 kg)   SpO2 96%   BMI 35.25 kg/m      Assessment & Plan:  Lorraine Leonard comes in today with chief complaint of Medical Management of Chronic Issues (Not taking Busbar)   Diagnosis and orders addressed:  1. Atrial fibrillation, unspecified type ([place]) - CMP14+EGFR - CBC with Differential/Platelet  2. Hypertension associated with diabetes ([place]) - CMP14+EGFR - CBC with Differential/Platelet  3. Paroxysmal atrial fibrillation ([facility]) - CMP14+EGFR - CBC with Differential/Platelet  4. Chronic obstructive pulmonary disease, unspecified COPD type ([place]) - CMP14+EGFR - CBC with Differential/Platelet  5. Pulmonary fibrosis ([facility]) - CMP14+EGFR - CBC with Differential/Platelet  6. Gastroesophageal reflux disease, unspecified whether esophagitis present - CMP14+EGFR - CBC with Differential/Platelet  7. Hyperlipidemia associated with type 2 diabetes mellitus ([facility]) - CMP14+EGFR - CBC with Differential/Platelet  8. Hypothyroidism, unspecified type - CMP14+EGFR - CBC with Differential/Platelet  9. Type 2 diabetes mellitus with diabetic polyneuropathy, without long-term current use of insulin ([facility]) - Bayer DCA Hb A1c Waived - CMP14+EGFR - CBC with Differential/Platelet - Microalbumin / creatinine urine ratio  10. Osteoarthritis of multiple joints, unspecified osteoarthritis type - CMP14+EGFR - CBC with Differential/Platelet  11. CKD (chronic kidney disease)  stage 2, GFR 60-89 ml/min - CMP14+EGFR - CBC with Differential/Platelet  12. Anxiety - - CMP14+EGFR - CBC with Differential/Platelet - sertraline (ZOLOFT) 50 MG tablet; Take 1 tablet (50 mg total) by mouth daily.  Dispense: 90 tablet; Refill: 1  13. Morbid obesity ([place]) - CMP14+EGFR - CBC with  Differential/Platelet  14. Dark stools Will check hgb and FOBT to rule out GI bleed - CMP14+EGFR - CBC with Differential/Platelet - Fecal occult blood, imunochemical; Future   Labs pending Health Maintenance reviewed Diet and exercise encouraged  Follow up plan: 3 months    Evelina Dun, FNP

## 2019-08-31 ENCOUNTER — Other Ambulatory Visit: Payer: Medicare Other

## 2019-08-31 DIAGNOSIS — R195 Other fecal abnormalities: Secondary | ICD-10-CM

## 2019-08-31 LAB — CBC WITH DIFFERENTIAL/PLATELET
Basophils Absolute: 0.1 10*3/uL (ref 0.0–0.2)
Basos: 1 %
EOS (ABSOLUTE): 0 10*3/uL (ref 0.0–0.4)
Eos: 0 %
Hematocrit: 34.6 % (ref 34.0–46.6)
Hemoglobin: 10.6 g/dL — ABNORMAL LOW (ref 11.1–15.9)
Immature Grans (Abs): 0 10*3/uL (ref 0.0–0.1)
Immature Granulocytes: 0 %
Lymphocytes Absolute: 2 10*3/uL (ref 0.7–3.1)
Lymphs: 19 %
MCH: 25.4 pg — ABNORMAL LOW (ref 26.6–33.0)
MCHC: 30.6 g/dL — ABNORMAL LOW (ref 31.5–35.7)
MCV: 83 fL (ref 79–97)
Monocytes Absolute: 0.7 10*3/uL (ref 0.1–0.9)
Monocytes: 7 %
Neutrophils Absolute: 8.1 10*3/uL — ABNORMAL HIGH (ref 1.4–7.0)
Neutrophils: 73 %
Platelets: 294 10*3/uL (ref 150–450)
RBC: 4.18 x10E6/uL (ref 3.77–5.28)
RDW: 13.9 % (ref 11.7–15.4)
WBC: 11 10*3/uL — ABNORMAL HIGH (ref 3.4–10.8)

## 2019-08-31 LAB — MICROALBUMIN / CREATININE URINE RATIO
Creatinine, Urine: 32.3 mg/dL
Microalb/Creat Ratio: 11 mg/g creat (ref 0–29)
Microalbumin, Urine: 3.5 ug/mL

## 2019-08-31 LAB — CMP14+EGFR
ALT: 14 IU/L (ref 0–32)
AST: 19 IU/L (ref 0–40)
Albumin/Globulin Ratio: 1.4 (ref 1.2–2.2)
Albumin: 4.3 g/dL (ref 3.7–4.7)
Alkaline Phosphatase: 64 IU/L (ref 39–117)
BUN/Creatinine Ratio: 16 (ref 12–28)
BUN: 21 mg/dL (ref 8–27)
Bilirubin Total: 0.2 mg/dL (ref 0.0–1.2)
CO2: 24 mmol/L (ref 20–29)
Calcium: 9.5 mg/dL (ref 8.7–10.3)
Chloride: 98 mmol/L (ref 96–106)
Creatinine, Ser: 1.32 mg/dL — ABNORMAL HIGH (ref 0.57–1.00)
GFR calc Af Amer: 44 mL/min/{1.73_m2} — ABNORMAL LOW (ref >59)
GFR calc non Af Amer: 38 mL/min/{1.73_m2} — ABNORMAL LOW (ref >59)
Globulin, Total: 3.1 g/dL (ref 1.5–4.5)
Glucose: 167 mg/dL — ABNORMAL HIGH (ref 65–99)
Potassium: 4.5 mmol/L (ref 3.5–5.2)
Sodium: 141 mmol/L (ref 134–144)
Total Protein: 7.4 g/dL (ref 6.0–8.5)

## 2019-09-01 LAB — FECAL OCCULT BLOOD, IMMUNOCHEMICAL: Fecal Occult Bld: POSITIVE — AB

## 2019-09-01 NOTE — Progress Notes (Signed)
NA 5/13 jr

## 2019-09-04 ENCOUNTER — Other Ambulatory Visit: Payer: Self-pay | Admitting: Family

## 2019-09-04 DIAGNOSIS — L501 Idiopathic urticaria: Secondary | ICD-10-CM

## 2019-09-05 ENCOUNTER — Other Ambulatory Visit: Payer: Self-pay | Admitting: Family

## 2019-09-05 DIAGNOSIS — R195 Other fecal abnormalities: Secondary | ICD-10-CM

## 2019-09-05 MED ORDER — PREDNISONE 5 MG PO TABS
5.0000 mg | ORAL_TABLET | Freq: Every day | ORAL | 2 refills | Status: DC
Start: 2019-09-05 — End: 2020-05-29

## 2019-09-06 ENCOUNTER — Ambulatory Visit (INDEPENDENT_AMBULATORY_CARE_PROVIDER_SITE_OTHER): Payer: Medicare Other | Admitting: *Deleted

## 2019-09-06 DIAGNOSIS — I4891 Unspecified atrial fibrillation: Secondary | ICD-10-CM | POA: Diagnosis not present

## 2019-09-06 LAB — CUP PACEART REMOTE DEVICE CHECK
Battery Remaining Longevity: 113 mo
Battery Voltage: 2.94 V
Brady Statistic AP VP Percent: 0.74 %
Brady Statistic AP VS Percent: 27.47 %
Brady Statistic AS VP Percent: 0 %
Brady Statistic AS VS Percent: 71.79 %
Brady Statistic RA Percent Paced: 58.96 %
Brady Statistic RV Percent Paced: 0.74 %
Date Time Interrogation Session: 20210518020106
Implantable Lead Implant Date: 20190128
Implantable Lead Implant Date: 20190128
Implantable Lead Location: 753860
Implantable Lead Location: 753860
Implantable Lead Model: 3830
Implantable Lead Model: 5076
Implantable Pulse Generator Implant Date: 20190128
Lead Channel Impedance Value: 304 Ohm
Lead Channel Impedance Value: 342 Ohm
Lead Channel Impedance Value: 380 Ohm
Lead Channel Impedance Value: 494 Ohm
Lead Channel Sensing Intrinsic Amplitude: 10.125 mV
Lead Channel Sensing Intrinsic Amplitude: 10.125 mV
Lead Channel Sensing Intrinsic Amplitude: 2.25 mV
Lead Channel Sensing Intrinsic Amplitude: 2.25 mV
Lead Channel Setting Pacing Amplitude: 2.5 V
Lead Channel Setting Pacing Amplitude: 3.5 V
Lead Channel Setting Pacing Pulse Width: 0.4 ms
Lead Channel Setting Sensing Sensitivity: 0.9 mV

## 2019-09-07 ENCOUNTER — Telehealth: Payer: Self-pay | Admitting: Family

## 2019-09-07 NOTE — Progress Notes (Signed)
Remote pacemaker transmission.   

## 2019-09-07 NOTE — Telephone Encounter (Signed)
Talked to triage nurse

## 2019-09-26 DIAGNOSIS — J449 Chronic obstructive pulmonary disease, unspecified: Secondary | ICD-10-CM | POA: Diagnosis not present

## 2019-09-26 DIAGNOSIS — I509 Heart failure, unspecified: Secondary | ICD-10-CM | POA: Diagnosis not present

## 2019-09-26 DIAGNOSIS — J8410 Pulmonary fibrosis, unspecified: Secondary | ICD-10-CM | POA: Diagnosis not present

## 2019-09-29 ENCOUNTER — Ambulatory Visit (INDEPENDENT_AMBULATORY_CARE_PROVIDER_SITE_OTHER): Payer: Medicare Other | Admitting: Family

## 2019-09-29 ENCOUNTER — Encounter: Payer: Self-pay | Admitting: Family

## 2019-09-29 DIAGNOSIS — R399 Unspecified symptoms and signs involving the genitourinary system: Secondary | ICD-10-CM | POA: Diagnosis not present

## 2019-09-29 LAB — URINALYSIS, COMPLETE
Bilirubin, UA: NEGATIVE
Glucose, UA: NEGATIVE
Ketones, UA: NEGATIVE
Nitrite, UA: POSITIVE — AB
Protein,UA: NEGATIVE
RBC, UA: NEGATIVE
Specific Gravity, UA: 1.01 (ref 1.005–1.030)
Urobilinogen, Ur: 0.2 mg/dL (ref 0.2–1.0)
pH, UA: 6 (ref 5.0–7.5)

## 2019-09-29 LAB — MICROSCOPIC EXAMINATION
RBC, Urine: NONE SEEN /hpf (ref 0–2)
Renal Epithel, UA: NONE SEEN /hpf

## 2019-09-29 NOTE — Addendum Note (Signed)
Addended by: Pollyann Kennedy F on: 09/29/2019 12:35 PM   Modules accepted: Orders

## 2019-09-29 NOTE — Progress Notes (Signed)
   Virtual Visit via telephone Note Due to COVID-19 pandemic this visit was conducted virtually. This visit type was conducted due to national recommendations for restrictions regarding the COVID-19 Pandemic (e.g. social distancing, sheltering in place) in an effort to limit this patient's exposure and mitigate transmission in our community. All issues noted in this document were discussed and addressed.  A physical exam was not performed with this format.  I connected with Lorraine Leonard on 09/29/19 at 10:30 AM by telephone and verified that I am speaking with the correct person using two identifiers. Lorraine Leonard is currently located at home and husband is currently with her during visit. The provider, Evelina Dun, FNP is located in their office at time of visit.  I discussed the limitations, risks, security and privacy concerns of performing an evaluation and management service by telephone and the availability of in person appointments. I also discussed with the patient that there may be a patient responsible charge related to this service. The patient expressed understanding and agreed to proceed.   History and Present Illness:  Pt calls the office today with UTI symptoms that started yesterday. She reports she started taking vinegar water and AZO and her symptoms have resolved. She is followed by a Urologists and takes Keflex 250 mg daily.   Dysuria  This is a new problem. The current episode started yesterday. The problem occurs intermittently. The problem has been waxing and waning. The quality of the pain is described as burning. The pain is at a severity of 8/10. The pain is mild. Associated symptoms include frequency, hesitancy, nausea and urgency. Pertinent negatives include no discharge, flank pain, hematuria or vomiting. She has tried increased fluids for the symptoms. The treatment provided mild relief.     Review of Systems  Gastrointestinal: Positive for nausea. Negative for  vomiting.  Genitourinary: Positive for dysuria, frequency, hesitancy and urgency. Negative for flank pain and hematuria.  All other systems reviewed and are negative.    Observations/Objective: No SOB or distress noted   Assessment and Plan: 1. UTI symptoms -Pt will come and bring in sample since symptoms have improved. Continue Keflex daily.  Force fluids AZO over the counter X2 days Call if symptoms worsen or do not improve  Culture pending - Urinalysis, Complete; Future - Urine Culture; Future     I discussed the assessment and treatment plan with the patient. The patient was provided an opportunity to ask questions and all were answered. The patient agreed with the plan and demonstrated an understanding of the instructions.   The patient was advised to call back or seek an in-person evaluation if the symptoms worsen or if the condition fails to improve as anticipated.  The above assessment and management plan was discussed with the patient. The patient verbalized understanding of and has agreed to the management plan. Patient is aware to call the clinic if symptoms persist or worsen. Patient is aware when to return to the clinic for a follow-up visit. Patient educated on when it is appropriate to go to the emergency department.   Time call ended:  10:41 AM  I provided 11 minutes of non-face-to-face time during this encounter.    Evelina Dun, FNP

## 2019-09-30 ENCOUNTER — Telehealth: Payer: Self-pay | Admitting: Family

## 2019-09-30 MED ORDER — AMOXICILLIN-POT CLAVULANATE 875-125 MG PO TABS
1.0000 | ORAL_TABLET | Freq: Two times a day (BID) | ORAL | 0 refills | Status: DC
Start: 2019-09-30 — End: 2020-01-05

## 2019-09-30 NOTE — Telephone Encounter (Signed)
See result note.  

## 2019-09-30 NOTE — Telephone Encounter (Signed)
Lorraine Leonard saw this patient yesterday, she is in office.

## 2019-09-30 NOTE — Telephone Encounter (Signed)
She can stop this while taking the Augmentin and restart once she is finished.

## 2019-09-30 NOTE — Telephone Encounter (Signed)
Does she keep taking the daily keflex with the augmentin?

## 2019-09-30 NOTE — Telephone Encounter (Signed)
Pt aware.

## 2019-10-01 LAB — URINE CULTURE

## 2019-10-03 ENCOUNTER — Telehealth: Payer: Self-pay | Admitting: Internal Medicine

## 2019-10-03 NOTE — Telephone Encounter (Signed)
STAT if HR is under 50 or over 120 (normal HR is 60-100 beats per minute)  1) What is your heart rate? 55  2) Do you have a log of your heart rate readings (document readings)? 50,51,55, 40  3) Do you have any other symptoms? Sluggish, fatigued feeling. Gets tired after moving from one room to the next.   Pt said her HR has been low for a couple weeks now. She has been fatigued as well and wanted to know what Dr. Lovena Le wanted her to do

## 2019-10-04 ENCOUNTER — Other Ambulatory Visit: Payer: Self-pay

## 2019-10-04 DIAGNOSIS — J449 Chronic obstructive pulmonary disease, unspecified: Secondary | ICD-10-CM

## 2019-10-04 MED ORDER — ALBUTEROL SULFATE HFA 108 (90 BASE) MCG/ACT IN AERS: 2.0000 | INHALATION_SPRAY | Freq: Four times a day (QID) | RESPIRATORY_TRACT | 0 refills | Status: DC | PRN

## 2019-10-04 MED ORDER — ATENOLOL 25 MG PO TABS
37.5000 mg | ORAL_TABLET | Freq: Every day | ORAL | 1 refills | Status: DC
Start: 2019-10-04 — End: 2019-10-13

## 2019-10-04 NOTE — Telephone Encounter (Signed)
Thanks for update, Hardie Pulley can we lowere her atenolol to 37.5mg  daily. 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    Zandra Abts MD

## 2019-10-04 NOTE — Telephone Encounter (Signed)
Pt voiced understanding - new dose sent to pharmacy

## 2019-10-05 DIAGNOSIS — R195 Other fecal abnormalities: Secondary | ICD-10-CM | POA: Diagnosis not present

## 2019-10-05 DIAGNOSIS — D509 Iron deficiency anemia, unspecified: Secondary | ICD-10-CM | POA: Diagnosis not present

## 2019-10-05 DIAGNOSIS — A09 Infectious gastroenteritis and colitis, unspecified: Secondary | ICD-10-CM | POA: Diagnosis not present

## 2019-10-05 DIAGNOSIS — R109 Unspecified abdominal pain: Secondary | ICD-10-CM | POA: Diagnosis not present

## 2019-10-11 DIAGNOSIS — A09 Infectious gastroenteritis and colitis, unspecified: Secondary | ICD-10-CM | POA: Diagnosis not present

## 2019-10-12 ENCOUNTER — Telehealth: Payer: Self-pay | Admitting: Internal Medicine

## 2019-10-12 NOTE — Telephone Encounter (Addendum)
Pt states for the last 2 weeks she has been feeling "bad". Pt reports being dizzy. Denies passing out. States that sx have not improved since medication dose change of atenolol. Current BP is 141/51 HR 49. Pt feels diastolic number is too low. Please advise.

## 2019-10-12 NOTE — Telephone Encounter (Signed)
Pt c/o BP issue: STAT if pt c/o blurred vision, one-sided weakness or slurred speech  1. What are your last 5 BP readings? 130/50  2. Are you having any other symptoms (ex. Dizziness, headache, blurred vision, passed out)? Headache, a little dizziness  3. What is your BP issue? Low blood pressure and heart rate

## 2019-10-13 MED ORDER — ATENOLOL 25 MG PO TABS
25.0000 mg | ORAL_TABLET | Freq: Every day | ORAL | 6 refills | Status: DC
Start: 2019-10-13 — End: 2020-02-24

## 2019-10-13 NOTE — Telephone Encounter (Signed)
Lower atenolol further to 25mg  daily, will help HRs come up as well as her diastolic bp   Zandra Abts MD

## 2019-10-13 NOTE — Telephone Encounter (Signed)
Pt notified and voiced understanding 

## 2019-10-17 ENCOUNTER — Other Ambulatory Visit: Payer: Self-pay | Admitting: Family

## 2019-10-26 DIAGNOSIS — I509 Heart failure, unspecified: Secondary | ICD-10-CM | POA: Diagnosis not present

## 2019-10-26 DIAGNOSIS — J8410 Pulmonary fibrosis, unspecified: Secondary | ICD-10-CM | POA: Diagnosis not present

## 2019-10-26 DIAGNOSIS — J449 Chronic obstructive pulmonary disease, unspecified: Secondary | ICD-10-CM | POA: Diagnosis not present

## 2019-10-28 ENCOUNTER — Other Ambulatory Visit: Payer: Self-pay | Admitting: Emergency Medicine

## 2019-10-28 DIAGNOSIS — J449 Chronic obstructive pulmonary disease, unspecified: Secondary | ICD-10-CM

## 2019-11-26 DIAGNOSIS — I509 Heart failure, unspecified: Secondary | ICD-10-CM | POA: Diagnosis not present

## 2019-11-26 DIAGNOSIS — J8410 Pulmonary fibrosis, unspecified: Secondary | ICD-10-CM | POA: Diagnosis not present

## 2019-11-26 DIAGNOSIS — J449 Chronic obstructive pulmonary disease, unspecified: Secondary | ICD-10-CM | POA: Diagnosis not present

## 2019-12-06 ENCOUNTER — Ambulatory Visit (INDEPENDENT_AMBULATORY_CARE_PROVIDER_SITE_OTHER): Payer: Medicare Other | Admitting: *Deleted

## 2019-12-06 DIAGNOSIS — I4891 Unspecified atrial fibrillation: Secondary | ICD-10-CM

## 2019-12-06 LAB — CUP PACEART REMOTE DEVICE CHECK
Battery Remaining Longevity: 100 mo
Battery Voltage: 2.91 V
Brady Statistic AP VP Percent: 0.62 %
Brady Statistic AP VS Percent: 82.34 %
Brady Statistic AS VP Percent: 0 %
Brady Statistic AS VS Percent: 17.05 %
Brady Statistic RA Percent Paced: 97.99 %
Brady Statistic RV Percent Paced: 0.62 %
Date Time Interrogation Session: 20210817015342
Implantable Lead Implant Date: 20190128
Implantable Lead Implant Date: 20190128
Implantable Lead Location: 753860
Implantable Lead Location: 753860
Implantable Lead Model: 3830
Implantable Lead Model: 5076
Implantable Pulse Generator Implant Date: 20190128
Lead Channel Impedance Value: 285 Ohm
Lead Channel Impedance Value: 323 Ohm
Lead Channel Impedance Value: 361 Ohm
Lead Channel Impedance Value: 437 Ohm
Lead Channel Sensing Intrinsic Amplitude: 10.375 mV
Lead Channel Sensing Intrinsic Amplitude: 10.375 mV
Lead Channel Sensing Intrinsic Amplitude: 2.125 mV
Lead Channel Sensing Intrinsic Amplitude: 2.125 mV
Lead Channel Setting Pacing Amplitude: 2.5 V
Lead Channel Setting Pacing Amplitude: 3.5 V
Lead Channel Setting Pacing Pulse Width: 0.4 ms
Lead Channel Setting Sensing Sensitivity: 0.9 mV

## 2019-12-08 NOTE — Progress Notes (Signed)
Remote pacemaker transmission.   

## 2019-12-10 ENCOUNTER — Other Ambulatory Visit: Payer: Self-pay | Admitting: Family

## 2019-12-23 ENCOUNTER — Other Ambulatory Visit: Payer: Self-pay | Admitting: Family

## 2019-12-23 MED ORDER — VITAMIN D (ERGOCALCIFEROL) 1.25 MG (50000 UNIT) PO CAPS: 50000.0000 [IU] | ORAL_CAPSULE | ORAL | 1 refills | Status: DC

## 2019-12-23 NOTE — Telephone Encounter (Signed)
  Prescription Request  12/23/2019  What is the name of the medication or equipment? Vit d  Have you contacted your pharmacy to request a refill? (if applicable) yes  Which pharmacy would you like this sent to? [facility]   Patient notified that their request is being sent to the clinical staff for review and that they should receive a response within 2 business days.

## 2019-12-27 DIAGNOSIS — I509 Heart failure, unspecified: Secondary | ICD-10-CM | POA: Diagnosis not present

## 2019-12-27 DIAGNOSIS — J8410 Pulmonary fibrosis, unspecified: Secondary | ICD-10-CM | POA: Diagnosis not present

## 2019-12-27 DIAGNOSIS — J449 Chronic obstructive pulmonary disease, unspecified: Secondary | ICD-10-CM | POA: Diagnosis not present

## 2020-01-03 ENCOUNTER — Other Ambulatory Visit: Payer: Self-pay | Admitting: Family

## 2020-01-05 ENCOUNTER — Other Ambulatory Visit: Payer: Self-pay

## 2020-01-05 ENCOUNTER — Ambulatory Visit (INDEPENDENT_AMBULATORY_CARE_PROVIDER_SITE_OTHER): Payer: Medicare Other | Admitting: Cardiology

## 2020-01-05 ENCOUNTER — Encounter: Payer: Self-pay | Admitting: Cardiology

## 2020-01-05 VITALS — BP 122/70 | HR 50 | Ht 68.0 in | Wt 222.0 lb

## 2020-01-05 DIAGNOSIS — I4891 Unspecified atrial fibrillation: Secondary | ICD-10-CM

## 2020-01-05 DIAGNOSIS — Z23 Encounter for immunization: Secondary | ICD-10-CM

## 2020-01-05 DIAGNOSIS — I5032 Chronic diastolic (congestive) heart failure: Secondary | ICD-10-CM

## 2020-01-05 NOTE — Progress Notes (Signed)
Clinical Summary Lorraine Leonard is a 79 y.o.female seen today for follow up of the following medical problems.  1. Afib - followed by EP. Failed rate control due to bradycardia. Unable to maintain rhythm control - sheis s/p AV node ablation with pacemaker placement by EP.11/2019 normal device check   - atenolol lowered previosuyl due to low bp's - no recent chest palpitations   2. Chest pain - left sided and midchest, mild variable pain. Can occur at or with exrertion. Not positional. Lasts few seconds. Not related to food.    3.Chronic diastolic HF - 63/1497 echo LVEF 55-60%, no WMAs- -not checking home weights. By clinic weights up 8 lbs since last month. Has had some increased LE edema, some increased DOE   Home scale 222 lbs, down from prior visit  - no recent edema - takes lasix 40mg  once daily, she stopped 2nd dose.    4. Pulmonary fibrosis - followed by pulmionary - normally 3 L 24 hrs per day.  - chronic SOB that is stable, remains on 3L at home.     Past Medical History:  Diagnosis Date   Acute diastolic CHF (congestive heart failure) (HCC) 11/11/2016   Allergic rhinitis    Anxiety    Anxiety    Arthritis    "~ all my joints" (05/18/2017)   Atrial fibrillation ([place]) 09/2016   Atrial fibrillation with RVR ([place]) 05/18/2017   Chronic lower back pain    COPD (chronic obstructive pulmonary disease) (HCC)    Depression    Diverticulosis    Dyspnea    Dysrhythmia    GERD (gastroesophageal reflux disease)    Gout    History of blood transfusion 1983   "when I gave a 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"   Hyperlipidemia    Hypertension    Hypothyroidism    Macular degeneration of both eyes    Neuropathy    On home oxygen therapy    "3L; 24/7" (05/18/2017)   Pre-diabetes    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     albuterol (PROVENTIL) (2.5 MG/3ML) 0.083% nebulizer solution Take 3 mLs (2.5 mg total) by nebulization every 6 (six) hours as needed for wheezing or shortness of breath. J96.01 150 mL 1   albuterol (VENTOLIN HFA) 108 (90 Base) MCG/ACT inhaler TAKE 2 PUFFS BY MOUTH EVERY 6 HOURS AS NEEDED FOR WHEEZE OR SHORTNESS OF BREATH 8.5 g 0   amoxicillin-clavulanate (AUGMENTIN) 875-125 MG tablet Take 1 tablet by mouth 2 (two) times daily. 14 tablet 0   atenolol (TENORMIN) 25 MG tablet Take 1 tablet (25 mg total) by mouth daily. 30 tablet 6   azelastine (ASTELIN) 0.1 % nasal spray Place 2 sprays into both nostrils 2 (two) times daily. Use in each nostril as directed 30 mL 12   busPIRone (BUSPAR) 7.5 MG tablet TAKE 1 TABLET (7.5 MG TOTAL) BY MOUTH 3 (THREE)  TIMES DAILY. (Patient not taking: Reported on 08/30/2019) 270 tablet 1   calcium citrate (CALCITRATE - DOSED IN MG ELEMENTAL CALCIUM) 950 MG tablet Take 1 tablet by mouth 2 (two) times daily.      cephALEXin (KEFLEX) 250 MG capsule Take 250 mg by mouth daily.     cyanocobalamin 1000 MCG tablet Take 1,000 mcg by mouth daily. Vitamin b12 every AM     diclofenac sodium (VOLTAREN) 1 % GEL Apply 2 g topically 4 (four) times daily. (Patient taking differently: Apply 2 g topically 4 (four) times daily. AS NEEDED) 100 g 3   docusate sodium (COLACE) 100 MG capsule Take 200 mg by mouth daily as needed for mild constipation.     ELIQUIS 5 MG TABS tablet TAKE 1 TABLET BY MOUTH TWICE A DAY 180 tablet 0   EPINEPHrine 0.3 mg/0.3 mL IJ SOAJ injection Inject 0.3 mg into the muscle as  needed for anaphylaxis.     estradiol (ESTRACE) 0.1 MG/GM vaginal cream PLACE 7.82 APPLICATORFUL VAGINALLY WEEKLY AS NEEDED AS DIRECTED 42.5 g 1   famotidine (PEPCID) 20 MG tablet TAKE 1 TABLET (20 MG TOTAL) BY MOUTH 2 (TWO) TIMES DAILY FOR 14 DAYS. 180 tablet 1   fluticasone (FLONASE) 50 MCG/ACT nasal spray PLACE 1 SPRAY INTO BOTH NOSTRILS 2 (TWO) TIMES DAILY AS NEEDED FOR ALLERGIES OR RHINITIS. 48 mL 1   furosemide (LASIX) 40 MG tablet Take 1 tablet (40 mg total) by mouth 2 (two) times daily. 180 tablet 3   INCRUSE ELLIPTA 62.5 MCG/INH AEPB INHALE 1 PUFF INTO THE LUNGS DAILY. AS NEEDED 30 each 2   levocetirizine (XYZAL) 5 MG tablet TAKE 1 TABLET BY MOUTH EVERY DAY IN THE EVENING 90 tablet 1   levothyroxine (SYNTHROID) 125 MCG tablet TAKE 1 TABLET BY MOUTH EVERY DAY 90 tablet 3   metFORMIN (GLUCOPHAGE-XR) 500 MG 24 hr tablet TAKE 1 TABLET BY MOUTH EVERY DAY WITH BREAKFAST 90 tablet 0   Multiple Vitamins-Minerals (PRESERVISION AREDS 2) CAPS Take 1 capsule by mouth 2 (two) times daily.     Olopatadine HCl (PATADAY) 0.2 % SOLN Place 1 drop into both eyes daily. 10 mL 5   omeprazole (PRILOSEC) 20 MG capsule TAKE 1 CAPSULE BY MOUTH EVERY DAY 90 capsule 1   ondansetron (ZOFRAN) 8 MG tablet TAKE 1 TABLET (8 MG TOTAL) BY MOUTH EVERY 8 (EIGHT) HOURS AS NEEDED FOR NAUSEA OR VOMITING. 20 tablet 2   OXYGEN Inhale 3 L into the lungs continuous.     Polyethyl Glycol-Propyl Glycol (SYSTANE) 0.4-0.3 % GEL ophthalmic gel Place 1 application into both eyes at bedtime as needed (dry eyes).     predniSONE (DELTASONE) 5 MG tablet Take 1 tablet (5 mg total) by mouth daily with breakfast. 90 tablet 2   rosuvastatin (CRESTOR) 10 MG tablet TAKE 1 TABLET BY MOUTH EVERYDAY AT BEDTIME 90 tablet 0   sertraline (ZOLOFT) 50 MG tablet Take 1 tablet (50 mg total) by mouth daily. 90 tablet 1   spironolactone (ALDACTONE) 25 MG tablet TAKE 1 TABLET BY MOUTH EVERY DAY 90 tablet 0   vitamin C (ASCORBIC ACID) 500 MG  tablet Take 1,000 mg by mouth daily. AM     Vitamin D, Ergocalciferol, (DRISDOL) 1.25 MG (50000 UNIT) CAPS capsule Take 1 capsule (50,000 Units total) by mouth every 7 (seven) days. 12 capsule 1   vitamin E 400 UNIT capsule Take 400 Units by mouth daily. AM     No current facility-administered medications for this visit.  Past Surgical History:  Procedure Laterality Date   AV NODE ABLATION  05/18/2017   AV NODE ABLATION N/A 05/18/2017   Procedure: AV NODE ABLATION;  Surgeon: Evans Lance, MD;  Location: [facility];  Service: Cardiovascular;  Laterality: N/A;   BACK SURGERY     BREAST LUMPECTOMY Left    CARDIOVERSION N/A 01/30/2017   Procedure: CARDIOVERSION;  Surgeon: Josue Hector, MD;  [facility];  Service: Cardiovascular;  Laterality: N/A;   CARDIOVERSION N/A 04/09/2017   Procedure: CARDIOVERSION;  Surgeon: Satira Sark, MD;  [facility];  Service: Cardiovascular;  Laterality: N/A;   CATARACT EXTRACTION W/ INTRAOCULAR LENS  IMPLANT, BILATERAL Bilateral    DILATION AND CURETTAGE OF UTERUS     EUS N/A 10/03/2015   Procedure: UPPER ENDOSCOPIC ULTRASOUND (EUS) RADIAL;  Surgeon: Arta Silence, MD;  Location: [facility];  Service: Endoscopy;  Laterality: N/A;   FINE NEEDLE ASPIRATION N/A 10/03/2015   Procedure: FINE NEEDLE ASPIRATION (FNA) RADIAL;  Surgeon: Arta Silence, MD;  Location: [facility];  Service: Endoscopy;  Laterality: N/A;   INSERT / REPLACE / REMOVE PACEMAKER  05/18/2017   LUMBAR LAMINECTOMY  1995; 06/23/2008   L4-5/notes 08/20/2010   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   PACEMAKER IMPLANT N/A 05/18/2017   Procedure: PACEMAKER IMPLANT;  Surgeon: Evans Lance, MD;  Location: [facility];  Service: Cardiovascular;  Laterality: N/A;   TOTAL ABDOMINAL HYSTERECTOMY     TUBAL LIGATION       Allergies  Allergen Reactions   Allopurinol Swelling    Feet and legs swell    Mobic [Meloxicam] Swelling and Other (See Comments)    Feet and Leg swelling      Family History  Problem Relation Age of Onset   Diabetes Other    Hypertension Other    Coronary artery disease Other    Stroke Other    Asthma Other    Kidney disease Sister    Cancer Sister        lung   Lung cancer Sister    Cancer Sister        lung   Lung cancer Sister    Cancer Sister        lung   Suicidality Son        committed suicide in 2009   CVA Son    Hyperlipidemia Other    Anxiety disorder Other    Migraines Other    Heart failure Father    Healthy Brother    Cancer Sister        lung   Healthy Sister      Social History Ms. Casanova reports that she has never smoked. She has never used smokeless tobacco. Ms. Emrich reports no history of alcohol use.   Review of Systems CONSTITUTIONAL: No weight loss, fever, chills, weakness or fatigue.  HEENT: Eyes: No visual loss, blurred vision, double vision or yellow sclerae.No hearing loss, sneezing, congestion, runny nose or sore throat.  SKIN: No rash or itching.  CARDIOVASCULAR: per hpi RESPIRATORY: No shortness of breath, cough or sputum.  GASTROINTESTINAL: No anorexia, nausea, vomiting or diarrhea. No abdominal pain or blood.  GENITOURINARY: No burning on urination, no polyuria NEUROLOGICAL: No headache, dizziness, syncope, paralysis, ataxia, numbness or tingling in the extremities. No change in bowel or bladder control.  MUSCULOSKELETAL: No muscle, back pain, joint pain or stiffness.  LYMPHATICS: No enlarged nodes. No history of  splenectomy.  PSYCHIATRIC: No history of depression or anxiety.  ENDOCRINOLOGIC: No reports of sweating, cold or heat intolerance. No polyuria or polydipsia.  Marland Kitchen   Physical Examination Today's Vitals   01/05/20 1347  BP: 122/70  Pulse: (!) 50  SpO2: 98%  Weight: 222 lb (100.7 kg)  Height: 5\' 8"  (1.727 m)   Body mass index is 33.75 kg/m.  Gen: resting comfortably, no  acute distress HEENT: no scleral icterus, pupils equal round and reactive, no palptable cervical adenopathy,  CV: RRR, no m/r/g, no jvd Resp: dry crackles GI: abdomen is soft, non-tender, non-distended, normal bowel sounds, no hepatosplenomegaly MSK: extremities are warm, no edema.  Skin: warm, no rash Neuro:  no focal deficits Psych: appropriate affect   Diagnostic Studies 11/2015 echo Study Conclusions  - Left ventricle: The cavity size was normal. There was severe focal basal hypertrophy of the septum. Systolic function was vigorous. The estimated ejection fraction was in the range of 65% to 70%. Wall motion was normal; there were no regional wall motion abnormalities. - Mitral valve: Moderately calcified annulus. There was mild regurgitation. - Left atrium: Volume/bsa, ES (1-plane Simpson&'s, A4C): 26.5 ml/m^2. - Right ventricle: The cavity size was normal. Wall thickness was mildly increased. - Pulmonary arteries: Systolic pressure was within the normal range.   09/2016 echo Study Conclusions  - Left ventricle: The cavity size was normal. Wall thickness was increased in a pattern of mild LVH. Systolic function was normal. The estimated ejection fraction was in the range of 60% to 65%. Wall motion was normal; there were no regional wall motion abnormalities. Features are consistent with a pseudonormal left ventricular filling pattern, with concomitant abnormal relaxation and increased filling pressure (grade 2 diastolic dysfunction). - Aortic valve: Mildly calcified annulus. Trileaflet. - Mitral valve: Calcified annulus. - Right atrium: Central venous pressure (est): 3 mm Hg. - Atrial septum: No defect or patent foramen ovale was identified. - Tricuspid valve: There was trivial regurgitation. - Pulmonary arteries: PA peak pressure: 25 mm Hg (S). - Pericardium, extracardiac: A prominent pericardial fat pad  was present.  Impressions:  - Mild LVH with LVEF 60-65% and grade 2 diastolic dysfunction. Calcified mitral annulus. Mildly sclerotic aortic valve. Trivial tricuspid regurgitation with normal PASP estimated 25 mmHg. Prominent pericardial fat pad noted.    Assessment and Plan  1.Afib - no recent symptoms, she will continue current meds - EKG today shows V pacing  2.Chronic diastolic HF - no symptoms, she is euvolemic. Continue lasix, she is taking 40mg  just once daily, but knows to take additional 40mg  as needed     Arnoldo Lenis, M.D..

## 2020-01-05 NOTE — Patient Instructions (Signed)
Medication Instructions:  Your physician recommends that you continue on your current medications as directed. Please refer to the Current Medication list given to you today.  *If you need a refill on your cardiac medications before your next appointment, please call your pharmacy*   Lab Work: None today If you have labs (blood work) drawn today and your tests are completely normal, you will receive your results only by: . MyChart Message (if you have MyChart) OR . A paper copy in the mail If you have any lab test that is abnormal or we need to change your treatment, we will call you to review the results.   Testing/Procedures: None today   Follow-Up: At [facility], you and your health needs are our priority.  As part of our continuing mission to provide you with exceptional heart care, we have created designated Provider Care Teams.  These Care Teams include your primary Cardiologist (physician) and Advanced Practice Providers (APPs -  Physician Assistants and Nurse Practitioners) who all work together to provide you with the care you need, when you need it.  We recommend signing up for the patient portal called "MyChart".  Sign up information is provided on this After Visit Summary.  MyChart is used to connect with patients for Virtual Visits (Telemedicine).  Patients are able to view lab/test results, encounter notes, upcoming appointments, etc.  Non-urgent messages can be sent to your provider as well.   To learn more about what you can do with MyChart, go to [email or web address].    Your next appointment:   6 month(s)  The format for your next appointment:   In Person  Provider:   Jonathan Branch, MD   Other Instructions None       Thank you for choosing [facility] !         

## 2020-01-12 ENCOUNTER — Telehealth: Payer: Self-pay | Admitting: Emergency Medicine

## 2020-01-12 NOTE — Telephone Encounter (Signed)
Spoke with the pt and scheduled appt  

## 2020-01-13 ENCOUNTER — Other Ambulatory Visit: Payer: Self-pay | Admitting: Family

## 2020-01-13 DIAGNOSIS — K219 Gastro-esophageal reflux disease without esophagitis: Secondary | ICD-10-CM

## 2020-01-25 ENCOUNTER — Other Ambulatory Visit: Payer: Self-pay | Admitting: Family

## 2020-01-25 DIAGNOSIS — J019 Acute sinusitis, unspecified: Secondary | ICD-10-CM

## 2020-01-26 DIAGNOSIS — J449 Chronic obstructive pulmonary disease, unspecified: Secondary | ICD-10-CM | POA: Diagnosis not present

## 2020-01-26 DIAGNOSIS — J8410 Pulmonary fibrosis, unspecified: Secondary | ICD-10-CM | POA: Diagnosis not present

## 2020-01-26 DIAGNOSIS — I509 Heart failure, unspecified: Secondary | ICD-10-CM | POA: Diagnosis not present

## 2020-01-27 ENCOUNTER — Encounter: Payer: Self-pay | Admitting: Family Medicine

## 2020-01-27 ENCOUNTER — Ambulatory Visit (INDEPENDENT_AMBULATORY_CARE_PROVIDER_SITE_OTHER): Payer: Medicare Other | Admitting: Family Medicine

## 2020-01-27 DIAGNOSIS — R399 Unspecified symptoms and signs involving the genitourinary system: Secondary | ICD-10-CM

## 2020-01-27 LAB — URINALYSIS, COMPLETE
Bilirubin, UA: NEGATIVE
Glucose, UA: NEGATIVE
Ketones, UA: NEGATIVE
Leukocytes,UA: NEGATIVE
Nitrite, UA: NEGATIVE
Protein,UA: NEGATIVE
RBC, UA: NEGATIVE
Specific Gravity, UA: 1.02 (ref 1.005–1.030)
Urobilinogen, Ur: 0.2 mg/dL (ref 0.2–1.0)
pH, UA: 6 (ref 5.0–7.5)

## 2020-01-27 LAB — MICROSCOPIC EXAMINATION
Bacteria, UA: NONE SEEN
RBC, Urine: NONE SEEN /hpf (ref 0–2)
WBC, UA: NONE SEEN /hpf (ref 0–5)

## 2020-01-27 NOTE — Progress Notes (Addendum)
Virtual Visit via Telephone Note  I connected with Lorraine Leonard on 01/27/20 at 10:58 AM by telephone and verified that I am speaking with the correct person using two identifiers. Lorraine Leonard is currently located at home and her husband is currently with her during this visit. The provider, Loman Brooklyn, FNP is located in their office at time of visit.  I discussed the limitations, risks, security and privacy concerns of performing an evaluation and management service by telephone and the availability of in person appointments. I also discussed with the patient that there may be a patient responsible charge related to this service. The patient expressed understanding and agreed to proceed.  Subjective: PCP: Lorraine Balloon, FNP  Chief Complaint  Patient presents with  . Urinary Tract Infection   Urinary Tract Infection: Patient complains of dysuria and frequency. She has had symptoms for 2 days.  She reports when she took AZO for a day and increased her fluid intake her symptoms resolved.  Patient denies back pain and fever. Patient does have a history of recurrent UTI.  Patient does not have a history of pyelonephritis.    ROS: Per HPI  Current Outpatient Medications:  .  acetaminophen (TYLENOL) 500 MG tablet, Take 500 mg by mouth every 6 (six) hours as needed (pain)., Disp: , Rfl:  .  albuterol (PROVENTIL) (2.5 MG/3ML) 0.083% nebulizer solution, Take 3 mLs (2.5 mg total) by nebulization every 6 (six) hours as needed for wheezing or shortness of breath. J96.01, Disp: 150 mL, Rfl: 1 .  albuterol (VENTOLIN HFA) 108 (90 Base) MCG/ACT inhaler, TAKE 2 PUFFS BY MOUTH EVERY 6 HOURS AS NEEDED FOR WHEEZE OR SHORTNESS OF BREATH, Disp: 8.5 g, Rfl: 0 .  atenolol (TENORMIN) 25 MG tablet, Take 1 tablet (25 mg total) by mouth daily., Disp: 30 tablet, Rfl: 6 .  azelastine (ASTELIN) 0.1 % nasal spray, Place 2 sprays into both nostrils 2 (two) times daily. Use in each nostril as directed, Disp: 30  mL, Rfl: 12 .  calcium citrate (CALCITRATE - DOSED IN MG ELEMENTAL CALCIUM) 950 MG tablet, Take 1 tablet by mouth 2 (two) times daily. , Disp: , Rfl:  .  cyanocobalamin 1000 MCG tablet, Take 1,000 mcg by mouth daily. Vitamin b12 every AM, Disp: , Rfl:  .  diclofenac sodium (VOLTAREN) 1 % GEL, Apply 2 g topically 4 (four) times daily. (Patient taking differently: Apply 2 g topically 4 (four) times daily. AS NEEDED), Disp: 100 g, Rfl: 3 .  docusate sodium (COLACE) 100 MG capsule, Take 200 mg by mouth daily as needed for mild constipation., Disp: , Rfl:  .  ELIQUIS 5 MG TABS tablet, TAKE 1 TABLET BY MOUTH TWICE A DAY, Disp: 180 tablet, Rfl: 0 .  EPINEPHrine 0.3 mg/0.3 mL IJ SOAJ injection, Inject 0.3 mg into the muscle as needed for anaphylaxis., Disp: , Rfl:  .  estradiol (ESTRACE) 0.1 MG/GM vaginal cream, PLACE 2.99 APPLICATORFUL VAGINALLY WEEKLY AS NEEDED AS DIRECTED, Disp: 42.5 g, Rfl: 1 .  fluticasone (FLONASE) 50 MCG/ACT nasal spray, PLACE 1 SPRAY INTO BOTH NOSTRILS 2 (TWO) TIMES DAILY AS NEEDED FOR ALLERGIES OR RHINITIS., Disp: 48 mL, Rfl: 1 .  furosemide (LASIX) 40 MG tablet, Take 1 tablet (40 mg total) by mouth 2 (two) times daily. (Patient taking differently: Take 40 mg by mouth daily. Pt reduced to qd vs bid), Disp: 180 tablet, Rfl: 3 .  INCRUSE ELLIPTA 62.5 MCG/INH AEPB, INHALE 1 PUFF INTO THE LUNGS DAILY.  AS NEEDED, Disp: 30 each, Rfl: 2 .  levocetirizine (XYZAL) 5 MG tablet, TAKE 1 TABLET BY MOUTH EVERY DAY IN THE EVENING, Disp: 90 tablet, Rfl: 1 .  levothyroxine (SYNTHROID) 125 MCG tablet, TAKE 1 TABLET BY MOUTH EVERY DAY, Disp: 90 tablet, Rfl: 3 .  metFORMIN (GLUCOPHAGE-XR) 500 MG 24 hr tablet, TAKE 1 TABLET BY MOUTH EVERY DAY WITH BREAKFAST, Disp: 90 tablet, Rfl: 0 .  Multiple Vitamins-Minerals (PRESERVISION AREDS 2) CAPS, Take 1 capsule by mouth 2 (two) times daily., Disp: , Rfl:  .  Olopatadine HCl (PATADAY) 0.2 % SOLN, Place 1 drop into both eyes daily., Disp: 10 mL, Rfl: 5 .   omeprazole (PRILOSEC) 20 MG capsule, TAKE 1 CAPSULE BY MOUTH EVERY DAY, Disp: 90 capsule, Rfl: 1 .  ondansetron (ZOFRAN) 8 MG tablet, TAKE 1 TABLET (8 MG TOTAL) BY MOUTH EVERY 8 (EIGHT) HOURS AS NEEDED FOR NAUSEA OR VOMITING., Disp: 20 tablet, Rfl: 2 .  OXYGEN, Inhale 3 L into the lungs continuous., Disp: , Rfl:  .  Polyethyl Glycol-Propyl Glycol (SYSTANE) 0.4-0.3 % GEL ophthalmic gel, Place 1 application into both eyes at bedtime as needed (dry eyes)., Disp: , Rfl:  .  predniSONE (DELTASONE) 5 MG tablet, Take 1 tablet (5 mg total) by mouth daily with breakfast., Disp: 90 tablet, Rfl: 2 .  rosuvastatin (CRESTOR) 10 MG tablet, TAKE 1 TABLET BY MOUTH EVERYDAY AT BEDTIME, Disp: 90 tablet, Rfl: 0 .  sertraline (ZOLOFT) 50 MG tablet, Take 1 tablet (50 mg total) by mouth daily., Disp: 90 tablet, Rfl: 1 .  spironolactone (ALDACTONE) 25 MG tablet, TAKE 1 TABLET BY MOUTH EVERY DAY, Disp: 90 tablet, Rfl: 0 .  vitamin C (ASCORBIC ACID) 500 MG tablet, Take 1,000 mg by mouth daily. AM, Disp: , Rfl:  .  Vitamin D, Ergocalciferol, (DRISDOL) 1.25 MG (50000 UNIT) CAPS capsule, Take 1 capsule (50,000 Units total) by mouth every 7 (seven) days., Disp: 12 capsule, Rfl: 1 .  vitamin E 400 UNIT capsule, Take 400 Units by mouth daily. AM, Disp: , Rfl:   Allergies  Allergen Reactions  . Allopurinol Swelling    Feet and legs swell  . Mobic [Meloxicam] Swelling and Other (See Comments)    Feet and Leg swelling   Past Medical History:  Diagnosis Date  . Acute diastolic CHF (congestive heart failure) ([place]) 11/11/2016  . Allergic rhinitis   . Anxiety   . Anxiety   . Arthritis    "~ all my joints" (05/18/2017)  . Atrial fibrillation ([place]) 09/2016  . Atrial fibrillation with RVR ([place]) 05/18/2017  . Chronic lower back pain   . COPD (chronic obstructive pulmonary disease) ([place])   . Depression   . Diverticulosis   . Dyspnea   . Dysrhythmia   . GERD (gastroesophageal reflux disease)   . Gout   . History of blood  transfusion 1983   "when I gave a 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"  . Hyperlipidemia   . Hypertension   . Hypothyroidism   . Macular degeneration of both eyes   . Neuropathy   . On home oxygen therapy    "3L; 24/7" (05/18/2017)  . Pre-diabetes   . 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 Pulmonary fibrosis ([place])   . Pulmonary fibrosis ([place])   . Rheumatoid arthritis ([place])   . Sleep apnea    uses 3 liters of o2 24/7  . Urticaria     Observations/Objective: A&O  No respiratory distress or wheezing audible over the phone  Mood, judgement, and thought processes all WNL   Assessment and Plan: 1. UTI symptoms - Advised patient to leave urine specimen and then we could treat from there if needed.  - Urine Culture; Future - Urinalysis, Complete; Future   Follow Up Instructions:  I discussed the assessment and treatment plan with the patient. The patient was provided an opportunity to ask questions and all were answered. The patient agreed with the plan and demonstrated an understanding of the instructions.   The patient was advised to call back or seek an in-person evaluation if the symptoms worsen or if the condition fails to improve as anticipated.  The above assessment and management plan was discussed with the patient. The patient verbalized understanding of and has agreed to the management plan. Patient is aware to call the clinic if symptoms persist or worsen. Patient is aware when to return to the clinic for a follow-up visit. Patient educated on when it is appropriate to go to the emergency department.   Time call ended: 11:07 AM  I provided 11 minutes of non-face-to-face time during this encounter.  Hendricks Limes, MSN, APRN, FNP-C [facility] 01/27/20

## 2020-01-29 LAB — URINE CULTURE

## 2020-01-30 ENCOUNTER — Ambulatory Visit: Payer: Medicare Other | Admitting: Pharmacist

## 2020-02-23 ENCOUNTER — Encounter: Payer: Medicare Other | Admitting: Internal Medicine

## 2020-02-24 ENCOUNTER — Ambulatory Visit (INDEPENDENT_AMBULATORY_CARE_PROVIDER_SITE_OTHER): Payer: Medicare Other | Admitting: Family

## 2020-02-24 ENCOUNTER — Encounter: Payer: Self-pay | Admitting: Family

## 2020-02-24 ENCOUNTER — Other Ambulatory Visit: Payer: Self-pay | Admitting: Family

## 2020-02-24 ENCOUNTER — Other Ambulatory Visit: Payer: Self-pay

## 2020-02-24 VITALS — BP 141/72 | HR 50 | Temp 97.4°F | Ht 67.0 in

## 2020-02-24 DIAGNOSIS — E039 Hypothyroidism, unspecified: Secondary | ICD-10-CM

## 2020-02-24 DIAGNOSIS — Z1159 Encounter for screening for other viral diseases: Secondary | ICD-10-CM | POA: Diagnosis not present

## 2020-02-24 DIAGNOSIS — N182 Chronic kidney disease, stage 2 (mild): Secondary | ICD-10-CM

## 2020-02-24 DIAGNOSIS — L501 Idiopathic urticaria: Secondary | ICD-10-CM

## 2020-02-24 DIAGNOSIS — I4891 Unspecified atrial fibrillation: Secondary | ICD-10-CM

## 2020-02-24 DIAGNOSIS — I5031 Acute diastolic (congestive) heart failure: Secondary | ICD-10-CM

## 2020-02-24 DIAGNOSIS — F331 Major depressive disorder, recurrent, moderate: Secondary | ICD-10-CM

## 2020-02-24 DIAGNOSIS — M159 Polyosteoarthritis, unspecified: Secondary | ICD-10-CM

## 2020-02-24 DIAGNOSIS — E1159 Type 2 diabetes mellitus with other circulatory complications: Secondary | ICD-10-CM

## 2020-02-24 DIAGNOSIS — J449 Chronic obstructive pulmonary disease, unspecified: Secondary | ICD-10-CM

## 2020-02-24 DIAGNOSIS — E1142 Type 2 diabetes mellitus with diabetic polyneuropathy: Secondary | ICD-10-CM | POA: Diagnosis not present

## 2020-02-24 DIAGNOSIS — K219 Gastro-esophageal reflux disease without esophagitis: Secondary | ICD-10-CM | POA: Diagnosis not present

## 2020-02-24 DIAGNOSIS — F419 Anxiety disorder, unspecified: Secondary | ICD-10-CM

## 2020-02-24 DIAGNOSIS — E559 Vitamin D deficiency, unspecified: Secondary | ICD-10-CM

## 2020-02-24 DIAGNOSIS — K5901 Slow transit constipation: Secondary | ICD-10-CM

## 2020-02-24 DIAGNOSIS — I152 Hypertension secondary to endocrine disorders: Secondary | ICD-10-CM

## 2020-02-24 DIAGNOSIS — E1169 Type 2 diabetes mellitus with other specified complication: Secondary | ICD-10-CM

## 2020-02-24 DIAGNOSIS — E785 Hyperlipidemia, unspecified: Secondary | ICD-10-CM

## 2020-02-24 LAB — BAYER DCA HB A1C WAIVED: HB A1C (BAYER DCA - WAIVED): 6.1 % (ref <7.0)

## 2020-02-24 MED ORDER — METFORMIN HCL ER 500 MG PO TB24: ORAL_TABLET | ORAL | 0 refills | Status: DC

## 2020-02-24 MED ORDER — CEPHALEXIN 250 MG PO CAPS: 250.0000 mg | ORAL_CAPSULE | Freq: Every day | ORAL | 1 refills | Status: DC

## 2020-02-24 MED ORDER — FAMOTIDINE 20 MG PO TABS: 20.0000 mg | ORAL_TABLET | Freq: Two times a day (BID) | ORAL | 1 refills | Status: DC

## 2020-02-24 MED ORDER — FUROSEMIDE 40 MG PO TABS: 40.0000 mg | ORAL_TABLET | Freq: Two times a day (BID) | ORAL | 3 refills | Status: DC

## 2020-02-24 MED ORDER — OMEPRAZOLE 20 MG PO CPDR: DELAYED_RELEASE_CAPSULE | ORAL | 1 refills | Status: DC

## 2020-02-24 MED ORDER — ALBUTEROL SULFATE HFA 108 (90 BASE) MCG/ACT IN AERS: INHALATION_SPRAY | RESPIRATORY_TRACT | 0 refills | Status: DC

## 2020-02-24 MED ORDER — ATENOLOL 25 MG PO TABS: 25.0000 mg | ORAL_TABLET | Freq: Every day | ORAL | 6 refills | Status: DC

## 2020-02-24 MED ORDER — LEVOTHYROXINE SODIUM 125 MCG PO TABS: 125.0000 ug | ORAL_TABLET | Freq: Every day | ORAL | 3 refills | Status: DC

## 2020-02-24 NOTE — Patient Instructions (Signed)

## 2020-02-24 NOTE — Progress Notes (Signed)
Subjective:    Patient ID: Lorraine Leonard, female    DOB: 08-03-1940, 79 y.o.   MRN: [id number]  Chief Complaint  Patient presents with  . Diabetes  . Hypertension   Pt presents to the office today for chronic follow up. She is followed by Cardiologists every 6 months for CHF &A Fib. She is followed by Pulmonologist 6 months for pulmonary hypertension and Pulmonary Fibrosis. She is on continuous 3 l of O2. She reports her breathing is stable. She is followed by Nephrologists every 6 months.   Pt reports her husband is currently in the hospital and this is causing increased anxiety.  Diabetes She presents for her follow-up diabetic visit. She has type 2 diabetes mellitus. Her disease course has been stable. Hypoglycemia symptoms include nervousness/anxiousness. Associated symptoms include fatigue and foot paresthesias. Pertinent negatives for diabetes include no blurred vision. There are no hypoglycemic complications. Symptoms are stable. Diabetic complications include heart disease. Risk factors for coronary artery disease include dyslipidemia, diabetes mellitus, hypertension, sedentary lifestyle and post-menopausal. She is following a generally healthy diet. Her overall blood glucose range is 180-200 mg/dl. An ACE inhibitor/angiotensin II receptor blocker is being taken. Eye exam is not current.  Hypertension This is a chronic problem. The current episode started more than 1 year ago. The problem has been resolved since onset. The problem is controlled. Associated symptoms include anxiety, malaise/fatigue and shortness of breath. Pertinent negatives include no blurred vision or peripheral edema. Risk factors for coronary artery disease include dyslipidemia, diabetes mellitus, sedentary lifestyle and obesity. The current treatment provides moderate improvement. Hypertensive end-organ damage includes CAD/MI and heart failure. Identifiable causes of hypertension include a thyroid problem.    Gastroesophageal Reflux She complains of belching and heartburn. This is a chronic problem. The current episode started more than 1 year ago. The problem occurs occasionally. The problem has been waxing and waning. Associated symptoms include fatigue. Risk factors include obesity. She has tried a PPI for the symptoms. The treatment provided moderate relief.  Thyroid Problem Presents for follow-up visit. Symptoms include anxiety, depressed mood and fatigue. The symptoms have been stable. Her past medical history is significant for heart failure and hyperlipidemia.  Hyperlipidemia This is a chronic problem. The current episode started more than 1 year ago. Exacerbating diseases include obesity. Associated symptoms include shortness of breath. Current antihyperlipidemic treatment includes statins. The current treatment provides moderate improvement of lipids. Risk factors for coronary artery disease include dyslipidemia, diabetes mellitus, hypertension, a sedentary lifestyle and post-menopausal.  Arthritis Presents for follow-up visit. She complains of pain and stiffness. The symptoms have been stable. Affected locations include the left knee and right knee (back). Her pain is at a severity of 8/10. Associated symptoms include fatigue.  Anxiety Presents for follow-up visit. Symptoms include depressed mood, excessive worry, irritability, nervous/anxious behavior, restlessness and shortness of breath. Symptoms occur most days. The severity of symptoms is moderate.    Depression        This is a chronic problem.  The current episode started more than 1 year ago.   The onset quality is gradual.   The problem occurs intermittently.  Associated symptoms include fatigue and restlessness.  Past medical history includes thyroid problem and anxiety.       Review of Systems  Constitutional: Positive for fatigue, irritability and malaise/fatigue.  Eyes: Negative for blurred vision.  Respiratory: Positive for  shortness of breath.   Gastrointestinal: Positive for heartburn.  Musculoskeletal: Positive for arthritis and stiffness.  Psychiatric/Behavioral: Positive for depression. The patient is nervous/anxious.   All other systems reviewed and are negative.      Objective:   Physical Exam Vitals reviewed.  Constitutional:      General: She is not in acute distress.    Appearance: She is well-developed.  HENT:     Head: Normocephalic and atraumatic.     Right Ear: Tympanic membrane normal.     Left Ear: Tympanic membrane normal.  Eyes:     Pupils: Pupils are equal, round, and reactive to light.  Neck:     Thyroid: No thyromegaly.  Cardiovascular:     Rate and Rhythm: Normal rate and regular rhythm.     Heart sounds: Normal heart sounds. No murmur heard.   Pulmonary:     Effort: Pulmonary effort is normal. No respiratory distress.     Breath sounds: Decreased air movement present. Decreased breath sounds present. No wheezing.     Comments: 3L O2 Abdominal:     General: Bowel sounds are normal. There is no distension.     Palpations: Abdomen is soft.     Tenderness: There is no abdominal tenderness.  Musculoskeletal:        General: No tenderness. Normal range of motion.     Cervical back: Normal range of motion and neck supple.  Skin:    General: Skin is warm and dry.  Neurological:     Mental Status: She is alert and oriented to person, place, and time.     Cranial Nerves: No cranial nerve deficit.     Motor: Weakness present.     Deep Tendon Reflexes: Reflexes are normal and symmetric.     Comments: Generalized weakness, in wheelchair  Psychiatric:        Behavior: Behavior normal.        Thought Content: Thought content normal.        Judgment: Judgment normal.     Diabetic Foot Exam - Simple   Simple Foot Form Diabetic Foot exam was performed with the following findings: Yes 02/24/2020 12:13 PM  Visual Inspection No deformities, no ulcerations, no other skin breakdown  bilaterally: Yes Sensation Testing See comments: Yes Pulse Check Posterior Tibialis and Dorsalis pulse intact bilaterally: Yes Comments Negative monofilament       BP (!) 141/72   Pulse (!) 50   Temp (!) 97.4 F (36.3 C) (Temporal)   Ht _0  (1.702 m)   SpO2 99%   BMI 34.77 kg/m      Assessment & Plan:  JERNI SELMER comes in today with chief complaint of Diabetes and Hypertension   Diagnosis and orders addressed:  1. Type 2 diabetes mellitus with diabetic polyneuropathy, without long-term current use of insulin (HCC) - Bayer DCA Hb A1c Waived - metFORMIN (GLUCOPHAGE-XR) 500 MG 24 hr tablet; TAKE 1 TABLET BY MOUTH EVERY DAY WITH BREAKFAST  Dispense: 90 tablet; Refill: 0 - CMP14+EGFR  2. Chronic obstructive pulmonary disease, unspecified COPD type (HCC) - albuterol (VENTOLIN HFA) 108 (90 Base) MCG/ACT inhaler; TAKE 2 PUFFS BY MOUTH EVERY 6 HOURS AS NEEDED FOR WHEEZE OR SHORTNESS OF BREATH  Dispense: 8.5 g; Refill: 0 - CMP14+EGFR  3. Gastro-esophageal reflux disease without esophagitis - omeprazole (PRILOSEC) 20 MG capsule; TAKE 1 CAPSULE BY MOUTH EVERY DAY  Dispense: 90 capsule; Refill: 1 - CMP14+EGFR  4. Idiopathic urticaria - famotidine (PEPCID) 20 MG tablet; Take 1 tablet (20 mg total) by mouth 2 (two) times daily for 14 days.  Dispense: 180  tablet; Refill: 1 - CMP14+EGFR  5. Acute diastolic CHF (congestive heart failure) (HCC) - CMP14+EGFR  6. Atrial fibrillation, unspecified type ([place])  - CMP14+EGFR  7. Hypertension associated with diabetes ([place]) - furosemide (LASIX) 40 MG tablet; Take 1 tablet (40 mg total) by mouth 2 (two) times daily.  Dispense: 180 tablet; Refill: 3 - atenolol (TENORMIN) 25 MG tablet; Take 1 tablet (25 mg total) by mouth daily.  Dispense: 30 tablet; Refill: 6 - CMP14+EGFR  8. Gastroesophageal reflux disease, unspecified whether esophagitis present - CMP14+EGFR  9. Hyperlipidemia associated with type 2 diabetes mellitus (HCC) -  CMP14+EGFR  10. Hypothyroidism, unspecified type  - levothyroxine (SYNTHROID) 125 MCG tablet; Take 1 tablet (125 mcg total) by mouth daily.  Dispense: 90 tablet; Refill: 3 - CMP14+EGFR - TSH  11. Osteoarthritis of multiple joints, unspecified osteoarthritis type - CMP14+EGFR  12. CKD (chronic kidney disease) stage 2, GFR 60-89 ml/min - CMP14+EGFR  13. Anxiety  - CMP14+EGFR  14. Slow transit constipation - CMP14+EGFR  15. Moderate episode of recurrent major depressive disorder (HCC)  - CMP14+EGFR  16. Morbid obesity (HCC) - CMP14+EGFR  17. Vitamin D deficiency  - CMP14+EGFR  18. Need for hepatitis C screening test  - CMP14+EGFR - Hepatitis C antibody   Labs pending Health Maintenance reviewed Diet and exercise encouraged  Follow up plan: 6 months    Evelina Dun, FNP

## 2020-02-25 LAB — CMP14+EGFR
ALT: 15 IU/L (ref 0–32)
AST: 23 IU/L (ref 0–40)
Albumin/Globulin Ratio: 1.6 (ref 1.2–2.2)
Albumin: 4.2 g/dL (ref 3.7–4.7)
Alkaline Phosphatase: 61 IU/L (ref 44–121)
BUN/Creatinine Ratio: 15 (ref 12–28)
BUN: 20 mg/dL (ref 8–27)
Bilirubin Total: 0.4 mg/dL (ref 0.0–1.2)
CO2: 27 mmol/L (ref 20–29)
Calcium: 10.1 mg/dL (ref 8.7–10.3)
Chloride: 99 mmol/L (ref 96–106)
Creatinine, Ser: 1.34 mg/dL — ABNORMAL HIGH (ref 0.57–1.00)
GFR calc Af Amer: 43 mL/min/{1.73_m2} — ABNORMAL LOW (ref >59)
GFR calc non Af Amer: 38 mL/min/{1.73_m2} — ABNORMAL LOW (ref >59)
Globulin, Total: 2.7 g/dL (ref 1.5–4.5)
Glucose: 140 mg/dL — ABNORMAL HIGH (ref 65–99)
Potassium: 4.7 mmol/L (ref 3.5–5.2)
Sodium: 139 mmol/L (ref 134–144)
Total Protein: 6.9 g/dL (ref 6.0–8.5)

## 2020-02-25 LAB — HEPATITIS C ANTIBODY: Hep C Virus Ab: <0.1 s/co ratio (ref 0.0–0.9)

## 2020-02-25 LAB — TSH: TSH: 0.92 u[IU]/mL (ref 0.450–4.500)

## 2020-02-26 DIAGNOSIS — I509 Heart failure, unspecified: Secondary | ICD-10-CM | POA: Diagnosis not present

## 2020-02-26 DIAGNOSIS — J8410 Pulmonary fibrosis, unspecified: Secondary | ICD-10-CM | POA: Diagnosis not present

## 2020-02-26 DIAGNOSIS — J449 Chronic obstructive pulmonary disease, unspecified: Secondary | ICD-10-CM | POA: Diagnosis not present

## 2020-02-28 ENCOUNTER — Ambulatory Visit (INDEPENDENT_AMBULATORY_CARE_PROVIDER_SITE_OTHER): Payer: Medicare Other | Admitting: Urology

## 2020-02-28 ENCOUNTER — Other Ambulatory Visit: Payer: Self-pay | Admitting: Family

## 2020-02-28 ENCOUNTER — Encounter: Payer: Self-pay | Admitting: Urology

## 2020-02-28 ENCOUNTER — Other Ambulatory Visit: Payer: Self-pay

## 2020-02-28 VITALS — BP 150/71 | HR 50 | Temp 98.0°F | Ht 67.0 in | Wt 218.0 lb

## 2020-02-28 DIAGNOSIS — N952 Postmenopausal atrophic vaginitis: Secondary | ICD-10-CM | POA: Diagnosis not present

## 2020-02-28 DIAGNOSIS — N390 Urinary tract infection, site not specified: Secondary | ICD-10-CM | POA: Diagnosis not present

## 2020-02-28 LAB — URINALYSIS, ROUTINE W REFLEX MICROSCOPIC
Bilirubin, UA: NEGATIVE
Ketones, UA: NEGATIVE
Nitrite, UA: POSITIVE — AB
Specific Gravity, UA: <1.005 — ABNORMAL LOW (ref 1.005–1.030)
Urobilinogen, Ur: 1 mg/dL (ref 0.2–1.0)
pH, UA: 5.5 (ref 5.0–7.5)

## 2020-02-28 LAB — MICROSCOPIC EXAMINATION
Renal Epithel, UA: NONE SEEN /hpf
WBC, UA: >30 /hpf — AB (ref 0–5)

## 2020-02-28 MED ORDER — METHENAMINE MANDELATE 1 G PO TABS: 1000.0000 mg | ORAL_TABLET | Freq: Two times a day (BID) | ORAL | 11 refills | Status: DC

## 2020-02-28 NOTE — Progress Notes (Signed)

## 2020-02-28 NOTE — Progress Notes (Signed)
H&P  Chief Complaint: Chro  History of Present Illness: Lorraine Leonard is a 79 y.o. year old female   11.9.2021: Pt reports that she feels she has a UTI at this time. Pt notes that she has approximately 3-4 UTIs per year. She becomes aware of her UTIs with dysuria and urinary frequency, but denies any gross hematuria or associated foul smell. Pt experiences UUI and notes that she has a good FOS and she feels that she is able to empty her bladder completely. Pt has been non-compliant with her estrace regimen. Currently on suppressive keflex. Has previously seen Dr Matilde Sprang.  (below copied from [facility] records):  Ms. Darsey has chronic cystitis on daily trimethoprim. She uses Premarin cream once a week for intermittent pelvic discomfort. She has mild stable frequency.   The patient may have had one and perhaps to breakthrough infections with typical cystitis symptoms recently on daily trimethoprim.  It appears she has had an episode recently of Clostridium difficile.   Clinically the patient is not infected today and frequency stable. She uses home oxygen. In spite of this I think we need to switch it to daily Macrodantin with 30 tablets and 11 refills recognizing risks and benefits. It appears she has a breakthrough history. She may have Clostridium difficile. This is the best antibiotic that could lower bowel risks and she describes again another recent hospitalization with a fever. The risk of pulmonary fibrosis very rare. A little bit of a circular conversation on pathophysiology.   The last few days patient has burning. No increased frequency or fever. She is on Lasix. Urine may be darker. She has been compliant with the Macrodantin.   This might be an early breakthrough but the urine looked quite normal but I sent for culture. I will call her with results. I: Keflex 500 mg 3 times a day for 7 days. If she does have a positive culture likely will switch her daily Keflex.   Today  The patient  had breakthrough infections on trimethoprim. She is on home oxygen but I put her on daily Macrodantin. She is prone to Clostridium difficile is noted  the urine culture was positive and treated. I put her on daily Keflex   Clinically not infected today. Even though I put in the order it appears she is on daily Macrodantin. I decided to keep her on this and check her in 6 months. I can always switch her if she gets another breakthrough to Keflex   Today  Frequency stable  There was question of a breakthrough 2 months ago but there is no proved. She was not having symptoms. 90 x 3 of Macrodantin sent and reassess in 1 year     Past Medical History:  Diagnosis Date  . Acute diastolic CHF (congestive heart failure) ([place]) 11/11/2016  . Allergic rhinitis   . Anxiety   . Anxiety   . Arthritis    "~ all my joints" (05/18/2017)  . Atrial fibrillation ([place]) 09/2016  . Atrial fibrillation with RVR ([place]) 05/18/2017  . Chronic lower back pain   . COPD (chronic obstructive pulmonary disease) ([place])   . Depression   . Diverticulosis   . Dyspnea   . Dysrhythmia   . GERD (gastroesophageal reflux disease)   . Gout   . 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 Hyperlipidemia   . Hypertension   . Hypothyroidism   . Macular degeneration of both eyes   .  Neuropathy   . On home oxygen therapy    "3L; 24/7" (05/18/2017)  . Pre-diabetes   . 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 Pulmonary fibrosis ([place])   . Pulmonary fibrosis ([place])   . Rheumatoid arthritis ([place])   . Sleep apnea    uses 3 liters of o2 24/7  . Urticaria     Past Surgical History:  Procedure Laterality Date  . AV NODE ABLATION  05/18/2017  . AV NODE ABLATION N/A 05/18/2017   Procedure: AV NODE ABLATION;  Surgeon: Evans Lance, MD;  Location: [facility];  Service: Cardiovascular;  Laterality: N/A;  . BACK SURGERY    . BREAST LUMPECTOMY Left   . CARDIOVERSION N/A  01/30/2017   Procedure: CARDIOVERSION;  Surgeon: Josue Hector, MD;  [facility];  Service: Cardiovascular;  Laterality: N/A;  . CARDIOVERSION N/A 04/09/2017   Procedure: CARDIOVERSION;  Surgeon: Satira Sark, MD;  [facility];  Service: Cardiovascular;  Laterality: N/A;  . CATARACT EXTRACTION W/ INTRAOCULAR LENS  IMPLANT, BILATERAL Bilateral   . DILATION AND CURETTAGE OF UTERUS    . EUS N/A 10/03/2015   Procedure: UPPER ENDOSCOPIC ULTRASOUND (EUS) RADIAL;  Surgeon: Arta Silence, MD;  Location: [facility];  Service: Endoscopy;  Laterality: N/A;  . FINE NEEDLE ASPIRATION N/A 10/03/2015   Procedure: FINE NEEDLE ASPIRATION (FNA) RADIAL;  Surgeon: Arta Silence, MD;  Location: [facility];  Service: Endoscopy;  Laterality: N/A;  . INSERT / REPLACE / REMOVE PACEMAKER  05/18/2017  . LUMBAR LAMINECTOMY  1995; 06/23/2008   L4-5/notes 08/20/2010  . 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 PACEMAKER IMPLANT N/A 05/18/2017   Procedure: PACEMAKER IMPLANT;  Surgeon: Evans Lance, MD;  Location: [facility];  Service: Cardiovascular;  Laterality: N/A;  . TOTAL ABDOMINAL HYSTERECTOMY    . TUBAL LIGATION      Home Medications:  (Not in a hospital admission)   Allergies:  Allergies  Allergen Reactions  . Allopurinol Swelling    Feet and legs swell  . Mobic [Meloxicam] Swelling and Other (See Comments)    Feet and Leg swelling    Family History  Problem Relation Age of Onset  . Diabetes Other   . Hypertension Other   . Coronary artery disease Other   . Stroke Other   . Asthma Other   . Kidney disease Sister   . Cancer Sister        lung  . Lung cancer Sister   . Cancer Sister        lung  . Lung cancer Sister   . Cancer Sister        lung  . Suicidality Son        committed suicide in 2009  . CVA Son   . Hyperlipidemia Other   . Anxiety disorder Other   . Migraines Other   . Heart failure Father   . Healthy  Brother   . Cancer Sister        lung  . Healthy Sister     Social History:  reports that she has never smoked. She has never used smokeless tobacco. She reports that she does not drink alcohol and does not use drugs.  ROS: A complete review of systems was performed.  All systems are negative except for pertinent findings as noted.  Physical Exam:  Vital signs in last 24 hours:   General:  Alert and  oriented, No acute distress HEENT: Normocephalic, atraumatic Neck: No JVD or lymphadenopathy Cardiovascular: Regular rate  Lungs: Pt on oxygen. Abdomen: Soft, nontender, nondistended, no abdominal masses Back: No CVA tenderness Extremities: No edema Neurologic: Grossly intact  Laboratory Data:  No results found for this or any previous visit (from the past 24 hour(s)). No results found for this or any previous visit (from the past 240 hour(s)). Creatinine: Recent Labs    02/24/20 1137  CREATININE 1.34*    Radiologic Imaging: No results found.  Impression/Assessment:  Recurrent UTIs: Pt has recurrent UTIs despite her suppressive abx regimen. Given inadequate response she will be started on an alternative pharmaceutical regimen (methenamine) following culture and possible abx intervention.  Plan:  1. Pt advised regarding suppressive abx and discontinued on cephalexin and importance of estrace application.  2. Pt started on methenamine and oriented to dosage and side effects.  3. Urine sent for culture today.  4. F/U in 6 months for OV and symptom recheck.  CC: Dr. Evelina Dun  Budd Palmer 02/28/2020, 1:00 PM  [name]. Latoria Dry MD

## 2020-03-03 LAB — URINE CULTURE

## 2020-03-06 ENCOUNTER — Other Ambulatory Visit: Payer: Self-pay | Admitting: Urology

## 2020-03-06 ENCOUNTER — Ambulatory Visit (INDEPENDENT_AMBULATORY_CARE_PROVIDER_SITE_OTHER): Payer: Medicare Other

## 2020-03-06 ENCOUNTER — Telehealth: Payer: Self-pay

## 2020-03-06 DIAGNOSIS — N390 Urinary tract infection, site not specified: Secondary | ICD-10-CM

## 2020-03-06 DIAGNOSIS — I480 Paroxysmal atrial fibrillation: Secondary | ICD-10-CM | POA: Diagnosis not present

## 2020-03-06 LAB — CUP PACEART REMOTE DEVICE CHECK
Battery Remaining Longevity: 90 mo
Battery Voltage: 2.91 V
Brady Statistic AP VP Percent: 2.57 %
Brady Statistic AP VS Percent: 96.4 %
Brady Statistic AS VP Percent: 0 %
Brady Statistic AS VS Percent: 1.03 %
Brady Statistic RA Percent Paced: 99.9 %
Brady Statistic RV Percent Paced: 2.57 %
Date Time Interrogation Session: 20211116011838
Implantable Lead Implant Date: 20190128
Implantable Lead Implant Date: 20190128
Implantable Lead Location: 753860
Implantable Lead Location: 753860
Implantable Lead Model: 3830
Implantable Lead Model: 5076
Implantable Pulse Generator Implant Date: 20190128
Lead Channel Impedance Value: 285 Ohm
Lead Channel Impedance Value: 361 Ohm
Lead Channel Impedance Value: 380 Ohm
Lead Channel Impedance Value: 494 Ohm
Lead Channel Sensing Intrinsic Amplitude: 10.875 mV
Lead Channel Sensing Intrinsic Amplitude: 10.875 mV
Lead Channel Sensing Intrinsic Amplitude: 3.625 mV
Lead Channel Sensing Intrinsic Amplitude: 3.625 mV
Lead Channel Setting Pacing Amplitude: 2.5 V
Lead Channel Setting Pacing Amplitude: 3.5 V
Lead Channel Setting Pacing Pulse Width: 0.4 ms
Lead Channel Setting Sensing Sensitivity: 0.9 mV

## 2020-03-06 MED ORDER — SULFAMETHOXAZOLE-TRIMETHOPRIM 800-160 MG PO TABS: 1.0000 | ORAL_TABLET | Freq: Two times a day (BID) | ORAL | 0 refills | Status: DC

## 2020-03-06 NOTE — Telephone Encounter (Signed)
-----   Message from Franchot Gallo, MD sent at 03/06/2020  8:17 AM EST ----- Notify pt--cuolture + I sent in 5 days of bactrim--stop methenamine while on abx ----- Message ----- From: Valentina Lucks, LPN Sent: 30/12/2328   8:25 AM EST To: Franchot Gallo, MD  Pls review.

## 2020-03-06 NOTE — Telephone Encounter (Signed)
Pt notified. Voiced understanding.

## 2020-03-07 ENCOUNTER — Other Ambulatory Visit: Payer: Self-pay | Admitting: Family

## 2020-03-07 NOTE — Progress Notes (Signed)
Remote pacemaker transmission.   

## 2020-03-09 ENCOUNTER — Ambulatory Visit (INDEPENDENT_AMBULATORY_CARE_PROVIDER_SITE_OTHER): Payer: Medicare Other | Admitting: Internal Medicine

## 2020-03-09 ENCOUNTER — Encounter: Payer: Self-pay | Admitting: Internal Medicine

## 2020-03-09 ENCOUNTER — Other Ambulatory Visit: Payer: Self-pay

## 2020-03-09 VITALS — BP 148/60 | HR 50 | Ht 67.0 in | Wt 218.0 lb

## 2020-03-09 DIAGNOSIS — I442 Atrioventricular block, complete: Secondary | ICD-10-CM | POA: Diagnosis not present

## 2020-03-09 LAB — CUP PACEART INCLINIC DEVICE CHECK
Battery Remaining Longevity: 97 mo
Battery Voltage: 2.9 V
Brady Statistic AP VP Percent: 1.31 %
Brady Statistic AP VS Percent: 63.2 %
Brady Statistic AS VP Percent: 0 %
Brady Statistic AS VS Percent: 35.49 %
Brady Statistic RA Percent Paced: 86.88 %
Brady Statistic RV Percent Paced: 1.31 %
Date Time Interrogation Session: 20211119145748
Implantable Lead Implant Date: 20190128
Implantable Lead Implant Date: 20190128
Implantable Lead Location: 753860
Implantable Lead Location: 753860
Implantable Lead Model: 3830
Implantable Lead Model: 5076
Implantable Pulse Generator Implant Date: 20190128
Lead Channel Impedance Value: 285 Ohm
Lead Channel Impedance Value: 361 Ohm
Lead Channel Impedance Value: 380 Ohm
Lead Channel Impedance Value: 513 Ohm
Lead Channel Pacing Threshold Amplitude: 1 V
Lead Channel Pacing Threshold Amplitude: 2.5 V
Lead Channel Pacing Threshold Pulse Width: 0.4 ms
Lead Channel Pacing Threshold Pulse Width: 1 ms
Lead Channel Sensing Intrinsic Amplitude: 9.3 mV
Lead Channel Setting Pacing Amplitude: 2.5 V
Lead Channel Setting Pacing Amplitude: 3.5 V
Lead Channel Setting Pacing Pulse Width: 0.4 ms
Lead Channel Setting Sensing Sensitivity: 0.9 mV

## 2020-03-09 NOTE — Patient Instructions (Signed)
Medication Instructions:  ?Your physician recommends that you continue on your current medications as directed. Please refer to the Current Medication list given to you today. ? ?*If you need a refill on your cardiac medications before your next appointment, please call your pharmacy* ? ? ?Lab Work: ?NONE  ? ?If you have labs (blood work) drawn today and your tests are completely normal, you will receive your results only by: ?MyChart Message (if you have MyChart) OR ?A paper copy in the mail ?If you have any lab test that is abnormal or we need to change your treatment, we will call you to review the results. ? ? ?Testing/Procedures: ?NONE  ? ?Follow-Up: ?At [facility], you and your health needs are our priority.  As part of our continuing mission to provide you with exceptional heart care, we have created designated Provider Care Teams.  These Care Teams include your primary Cardiologist (physician) and Advanced Practice Providers (APPs -  Physician Assistants and Nurse Practitioners) who all work together to provide you with the care you need, when you need it. ? ?We recommend signing up for the patient portal called "MyChart".  Sign up information is provided on this After Visit Summary.  MyChart is used to connect with patients for Virtual Visits (Telemedicine).  Patients are able to view lab/test results, encounter notes, upcoming appointments, etc.  Non-urgent messages can be sent to your provider as well.   ?To learn more about what you can do with MyChart, go to [email or web address].   ? ?Your next appointment:   ?1 year(s) ? ?The format for your next appointment:   ?In Person ? ?Provider:  You may see Dr.Taylor or one of the following Advanced Practice Providers on your designated Care Team:   ?Brittany Strader, PA-C  ?Michele Lenze, PA-C   ? ? ?Other Instructions ?Thank you for choosing [facility]! ?  ?  ?

## 2020-03-09 NOTE — Progress Notes (Signed)
HPI Ms. Rottenberg returns today for followup. She is a pleasant 79 yo woman with uncontrolled atrial fib who underwent AV node ablation and PPM insertion. In the interim, she has become more sedentary. She denies chest pain. She has lost 9 lbs since her last visit. She walks inside her house but not elsewhere. Allergies  Allergen Reactions  . Allopurinol Swelling    Feet and legs swell  . Mobic [Meloxicam] Swelling and Other (See Comments)    Feet and Leg swelling     Current Outpatient Medications  Medication Sig Dispense Refill  . acetaminophen (TYLENOL) 500 MG tablet Take 500 mg by mouth every 6 (six) hours as needed (pain).    Marland Kitchen albuterol (PROVENTIL) (2.5 MG/3ML) 0.083% nebulizer solution Take 3 mLs (2.5 mg total) by nebulization every 6 (six) hours as needed for wheezing or shortness of breath. J96.01 150 mL 1  . albuterol (VENTOLIN HFA) 108 (90 Base) MCG/ACT inhaler TAKE 2 PUFFS BY MOUTH EVERY 6 HOURS AS NEEDED FOR WHEEZE OR SHORTNESS OF BREATH 8.5 g 0  . atenolol (TENORMIN) 25 MG tablet Take 1 tablet (25 mg total) by mouth daily. 30 tablet 6  . azelastine (ASTELIN) 0.1 % nasal spray Place 2 sprays into both nostrils 2 (two) times daily. Use in each nostril as directed 30 mL 12  . calcium citrate (CALCITRATE - DOSED IN MG ELEMENTAL CALCIUM) 950 MG tablet Take 1 tablet by mouth 2 (two) times daily.     . cephALEXin (KEFLEX) 250 MG capsule Take 1 capsule (250 mg total) by mouth daily. 90 capsule 1  . cyanocobalamin 1000 MCG tablet Take 1,000 mcg by mouth daily. Vitamin b12 every AM    . diclofenac sodium (VOLTAREN) 1 % GEL Apply 2 g topically 4 (four) times daily. (Patient taking differently: Apply 2 g topically 4 (four) times daily. AS NEEDED) 100 g 3  . docusate sodium (COLACE) 100 MG capsule Take 200 mg by mouth daily as needed for mild constipation.    Marland Kitchen ELIQUIS 5 MG TABS tablet TAKE 1 TABLET BY MOUTH TWICE A DAY 180 tablet 0  . EPINEPHrine 0.3 mg/0.3 mL IJ SOAJ injection  Inject 0.3 mg into the muscle as needed for anaphylaxis.    Marland Kitchen estradiol (ESTRACE) 0.1 MG/GM vaginal cream PLACE 2.97 APPLICATORFUL VAGINALLY WEEKLY AS NEEDED AS DIRECTED 42.5 g 1  . famotidine (PEPCID) 20 MG tablet Take 1 tablet (20 mg total) by mouth 2 (two) times daily for 14 days. 180 tablet 1  . fluticasone (FLONASE) 50 MCG/ACT nasal spray PLACE 1 SPRAY INTO BOTH NOSTRILS 2 (TWO) TIMES DAILY AS NEEDED FOR ALLERGIES OR RHINITIS. 48 mL 1  . furosemide (LASIX) 40 MG tablet Take 1 tablet (40 mg total) by mouth 2 (two) times daily. 180 tablet 3  . INCRUSE ELLIPTA 62.5 MCG/INH AEPB INHALE 1 PUFF INTO THE LUNGS DAILY. AS NEEDED 30 each 2  . levocetirizine (XYZAL) 5 MG tablet TAKE 1 TABLET BY MOUTH EVERY DAY IN THE EVENING 90 tablet 1  . levothyroxine (SYNTHROID) 125 MCG tablet Take 1 tablet (125 mcg total) by mouth daily. 90 tablet 3  . metFORMIN (GLUCOPHAGE-XR) 500 MG 24 hr tablet TAKE 1 TABLET BY MOUTH EVERY DAY WITH BREAKFAST 90 tablet 0  . methenamine (MANDELAMINE) 1 g tablet Take 1 tablet (1,000 mg total) by mouth 2 (two) times daily. 60 tablet 11  . Multiple Vitamins-Minerals (PRESERVISION AREDS 2) CAPS Take 1 capsule by mouth 2 (two) times daily.    Marland Kitchen  Olopatadine HCl (PATADAY) 0.2 % SOLN Place 1 drop into both eyes daily. 10 mL 5  . omeprazole (PRILOSEC) 20 MG capsule TAKE 1 CAPSULE BY MOUTH EVERY DAY 90 capsule 1  . ondansetron (ZOFRAN) 8 MG tablet TAKE 1 TABLET (8 MG TOTAL) BY MOUTH EVERY 8 (EIGHT) HOURS AS NEEDED FOR NAUSEA OR VOMITING. 20 tablet 2  . OXYGEN Inhale 3 L into the lungs continuous.    Vladimir Faster Glycol-Propyl Glycol (SYSTANE) 0.4-0.3 % GEL ophthalmic gel Place 1 application into both eyes at bedtime as needed (dry eyes).    . predniSONE (DELTASONE) 5 MG tablet Take 1 tablet (5 mg total) by mouth daily with breakfast. 90 tablet 2  . rosuvastatin (CRESTOR) 10 MG tablet TAKE 1 TABLET BY MOUTH EVERYDAY AT BEDTIME 90 tablet 1  . sertraline (ZOLOFT) 50 MG tablet Take 1 tablet (50 mg  total) by mouth daily. 90 tablet 1  . spironolactone (ALDACTONE) 25 MG tablet TAKE 1 TABLET BY MOUTH EVERY DAY 90 tablet 0  . sulfamethoxazole-trimethoprim (BACTRIM DS) 800-160 MG tablet Take 1 tablet by mouth 2 (two) times daily. 10 tablet 0  . vitamin C (ASCORBIC ACID) 500 MG tablet Take 1,000 mg by mouth daily. AM    . Vitamin D, Ergocalciferol, (DRISDOL) 1.25 MG (50000 UNIT) CAPS capsule Take 1 capsule (50,000 Units total) by mouth every 7 (seven) days. 12 capsule 1  . vitamin E 400 UNIT capsule Take 400 Units by mouth daily. AM     No current facility-administered medications for this visit.     Past Medical History:  Diagnosis Date  . Acute diastolic CHF (congestive heart failure) ([place]) 11/11/2016  . Allergic rhinitis   . Anxiety   . Anxiety   . Arthritis    "~ all my joints" (05/18/2017)  . Atrial fibrillation ([place]) 09/2016  . Atrial fibrillation with RVR ([place]) 05/18/2017  . Chronic lower back pain   . COPD (chronic obstructive pulmonary disease) ([place])   . Depression   . Diverticulosis   . Dyspnea   . Dysrhythmia   . GERD (gastroesophageal reflux disease)   . Glaucoma   . Gout   . Heart disease   . 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 Hyperlipidemia   . Hypertension   . Hypothyroidism   . Macular degeneration of both eyes   . Neuropathy   . On home oxygen therapy    "3L; 24/7" (05/18/2017)  . Pre-diabetes   . 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 Pulmonary fibrosis ([place])   . Pulmonary fibrosis ([place])   . Rheumatoid arthritis ([place])   . Sleep apnea    uses 3 liters of o2 24/7  . Urticaria     ROS:   All systems reviewed and negative except as noted in the HPI.   Past Surgical History:  Procedure Laterality Date  . AV NODE ABLATION  05/18/2017  . AV NODE ABLATION N/A 05/18/2017   Procedure: AV NODE ABLATION;  Surgeon: Evans Lance, MD;  Location: [facility];  Service: Cardiovascular;   Laterality: N/A;  . BACK SURGERY    . BREAST LUMPECTOMY Left   . CARDIOVERSION N/A 01/30/2017   Procedure: CARDIOVERSION;  Surgeon: Josue Hector, MD;  Location: [facility];  Service: Cardiovascular;  Laterality: N/A;  . CARDIOVERSION N/A 04/09/2017   Procedure: CARDIOVERSION;  Surgeon: Satira Sark, MD;  Location: [facility];  Service: Cardiovascular;  Laterality: N/A;  . CATARACT EXTRACTION W/ INTRAOCULAR LENS  IMPLANT, BILATERAL Bilateral   . DILATION AND CURETTAGE OF UTERUS    . EUS N/A 10/03/2015   Procedure: UPPER ENDOSCOPIC ULTRASOUND (EUS) RADIAL;  Surgeon: Arta Silence, MD;  Location: [facility];  Service: Endoscopy;  Laterality: N/A;  . FINE NEEDLE ASPIRATION N/A 10/03/2015   Procedure: FINE NEEDLE ASPIRATION (FNA) RADIAL;  Surgeon: Arta Silence, MD;  Location: [facility];  Service: Endoscopy;  Laterality: N/A;  . INSERT / REPLACE / REMOVE PACEMAKER  05/18/2017  . LUMBAR LAMINECTOMY  1995; 06/23/2008   L4-5/notes 08/20/2010  . [place]   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  . NEPHRECTOMY TRANSPLANTED ORGAN    . PACEMAKER IMPLANT N/A 05/18/2017   Procedure: PACEMAKER IMPLANT;  Surgeon: Evans Lance, MD;  Location: [facility];  Service: Cardiovascular;  Laterality: N/A;  . TOTAL ABDOMINAL HYSTERECTOMY    . TUBAL LIGATION       Family History  Problem Relation Age of Onset  . Diabetes Other   . Hypertension Other   . Coronary artery disease Other   . Stroke Other   . Asthma Other   . Kidney disease Sister   . Cancer Sister        lung  . Lung cancer Sister   . Cancer Sister        lung  . Lung cancer Sister   . Cancer Sister        lung  . Suicidality Son        committed suicide in 2009  . CVA Son   . Hyperlipidemia Other   . Anxiety disorder Other   . Migraines Other   . Heart failure Father   . Healthy Brother   . Cancer Sister        lung  . Healthy Sister      Social History   Socioeconomic History  .  Marital status: Married    Spouse name: Mortimer Fries  . Number of children: 4  . Years of education: Not on file  . Highest education level: 9th grade  Occupational History  . Occupation: retired    Comment: cna  Tobacco Use  . Smoking status: Never Smoker  . Smokeless tobacco: Never Used  Vaping Use  . Vaping Use: Never used  Substance and Sexual Activity  . Alcohol use: No  . Drug use: No  . Sexual activity: Not Currently    Birth control/protection: Surgical  Other Topics Concern  . 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   Pt lives with her husband in a single wide trailer. They have 4 children and 13 grandchildren.    Social Determinants of Health   Financial Resource Strain:   . Difficulty of Paying Living Expenses: Not on file  Food Insecurity:   . Worried About Charity fundraiser in the Last Year: Not on file  . Ran Out of Food in the Last Year: Not on file  Transportation Needs:   . Lack of Transportation (Medical): Not on file  . Lack of Transportation (Non-Medical): Not on file  Physical Activity:   . Days of Exercise per Week: Not on file  . Minutes of Exercise per Session: Not on file  Stress:   . Feeling of Stress : Not on file  Social Connections:   . Frequency of Communication with Friends  and Family: Not on file  . Frequency of Social Gatherings with Friends and Family: Not on file  . Attends Religious Services: Not on file  . Active Member of Clubs or Organizations: Not on file  . Attends Archivist Meetings: Not on file  . Marital Status: Not on file  Intimate Partner Violence:   . Fear of Current or Ex-Partner: Not on file  . Emotionally Abused: Not on file  . Physically Abused: Not on file  . Sexually Abused: Not on file     BP (!) 148/60   Pulse (!) 50   Ht 5\' 7"  (1.702 m)   Wt 218 lb (98.9 kg)   SpO2 98%   BMI 34.14 kg/m   Physical Exam:  Chronically ill  appearing NAD HEENT: Unremarkable Neck:  6 cm JVD, no thyromegally Lymphatics:  No adenopathy Back:  No CVA tenderness Lungs:  Clear with no wheezes HEART:  IRegular rate rhythm, no murmurs, no rubs, no clicks Abd:  soft, positive bowel sounds, no organomegally, no rebound, no guarding Ext:  2 plus pulses, no edema, no cyanosis, no clubbing Skin:  No rashes no nodules Neuro:  CN II through XII intact, motor grossly intact  DEVICE  Normal device function.  See PaceArt for details.   Assess/Plan: 1. Atrial fib with a CVR - her PPM demonstrates that she is now dependent. 2. 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 We turned her rate from 50 to 70 to help with her cardiac output. 3. HTN  - her PR is well controlled. We will follow. 4. Obesity - she continues to lose weight and I asked her to get down under 200 lbs.  Carleene Overlie Fawaz Borquez,MD

## 2020-03-10 ENCOUNTER — Other Ambulatory Visit: Payer: Self-pay | Admitting: Family

## 2020-03-10 DIAGNOSIS — F419 Anxiety disorder, unspecified: Secondary | ICD-10-CM

## 2020-03-16 ENCOUNTER — Other Ambulatory Visit: Payer: Self-pay | Admitting: Family

## 2020-03-16 DIAGNOSIS — F419 Anxiety disorder, unspecified: Secondary | ICD-10-CM

## 2020-03-19 ENCOUNTER — Other Ambulatory Visit: Payer: Self-pay

## 2020-03-19 ENCOUNTER — Encounter: Payer: Self-pay | Admitting: Internal Medicine

## 2020-03-19 ENCOUNTER — Ambulatory Visit (INDEPENDENT_AMBULATORY_CARE_PROVIDER_SITE_OTHER): Payer: Medicare Other | Admitting: Internal Medicine

## 2020-03-19 DIAGNOSIS — J449 Chronic obstructive pulmonary disease, unspecified: Secondary | ICD-10-CM | POA: Insufficient documentation

## 2020-03-19 DIAGNOSIS — J8410 Pulmonary fibrosis, unspecified: Secondary | ICD-10-CM | POA: Diagnosis not present

## 2020-03-19 DIAGNOSIS — J9611 Chronic respiratory failure with hypoxia: Secondary | ICD-10-CM

## 2020-03-19 DIAGNOSIS — J4489 Other specified chronic obstructive pulmonary disease: Secondary | ICD-10-CM | POA: Insufficient documentation

## 2020-03-19 MED ORDER — BREO ELLIPTA 200-25 MCG/INH IN AEPB: 1.0000 | INHALATION_SPRAY | Freq: Every day | RESPIRATORY_TRACT | 0 refills | Status: DC

## 2020-03-19 MED ORDER — BREO ELLIPTA 100-25 MCG/INH IN AEPB: 1.0000 | INHALATION_SPRAY | Freq: Every morning | RESPIRATORY_TRACT | 11 refills | Status: DC

## 2020-03-19 NOTE — Assessment & Plan Note (Addendum)
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   >>> Rheum eval prn

## 2020-03-19 NOTE — Assessment & Plan Note (Signed)
As of 03/19/2020  - not desaturations walking 100 ft/ limited by legs   rec 3lpm hs and prn daytime for sats > 90% goal    I very strongly recommend you get the moderna or pfizer vaccine as soon as possible based on your risk of dying from the virus  and the proven safety and benefit of these vaccines against even the delta variant.  This can save your life as well as  those of your loved ones,  especially if they are also not vaccinated.   She is fatalistic and does not wish for vent in event of worsening resp failure from any cause, witnessed by daughter in law.

## 2020-03-19 NOTE — Progress Notes (Signed)
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  She may also have obstructive sleep apnea but this is not been confirmed as she has not wanted a polysomnogram.  It appears that she is not been on maintenance prednisone therapy since August 2021.  She was seen here in a phone visit earlier this month and treated with a prednisone taper for possible flare in either her COPD, ILD or both.  She was continued on Incruse and supplemental oxygen at 3 L/min by nasal cannula.    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 She reports that the pred taper helped her breathing - now feeling back to baseline. She is following w Dr Trudie Reed, started her on prednisone 10mg  which she is now taking daily. She is on Incruse daily. She has albuterol available is using 0-1x a day. She remains on flonase, xyzal. Good compliance w her O2.   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 She is going to communicate w Dr Trudie Reed and continue if she agrees Continue flonase and xyzal.   History of Present Illness  03/19/2020   Pulmonary/ 1st office eval/Lorraine Leonard  Prednisone ? 5 mg daily (was on incruse but off x months) vaccine resistant Chief Complaint  Patient presents with  . Follow-up    Patient states that she has had some wheezing for the last 4-5 days and coughing more. Wears 3 liters oxygen all the time.    Dyspnea:  Mostly housebound / very sedentary  Cough: more x one week  Sleep: bed is flat one pillow  SABA use: hfa has not used / rarely needed more than twice a week  Has not used in months  02 : runs in high 90s s 02 at rest but not checking with activity Sleeps on 3lpm   No obvious day to day or daytime variability or assoc excess/ purulent sputum or mucus plugs or hemoptysis or cp or chest tightness, subjective wheeze or overt sinus or hb symptoms.   Sleeping as above without nocturnal  or early am exacerbation  of respiratory  c/o's or need for noct saba. Also denies any obvious fluctuation of symptoms with weather or environmental changes or other aggravating or alleviating factors except as outlined above   No unusual exposure hx or h/o childhood pna/ asthma or knowledge of premature birth.  Current Allergies, Complete Past Medical History, Past Surgical History, Family History, and Social History were reviewed in [employer] record.  ROS  The following are not active complaints unless bolded Hoarseness, sore throat, dysphagia, dental problems, itching, sneezing,  nasal congestion or discharge of excess mucus  or purulent secretions, ear ache,   fever, chills, sweats, unintended wt loss or wt gain, classically pleuritic or exertional cp,  orthopnea pnd or arm/hand swelling  or leg swelling, presyncope, palpitations, abdominal pain, anorexia, nausea, vomiting, diarrhea  or change in bowel habits or change in bladder habits, change in stools or change in urine, dysuria, hematuria,  rash, arthralgias, visual complaints, headache, numbness, weakness or ataxia or problems with walking  or coordination,  change in mood or  memory.               Past Medical History:  Diagnosis Date  . Acute diastolic CHF (congestive heart failure) ([place]) 11/11/2016  . Allergic rhinitis   . Anxiety   . Anxiety   . Arthritis    "~ all my joints" (05/18/2017)  . Atrial fibrillation ([place]) 09/2016  . Atrial fibrillation with RVR ([place]) 05/18/2017  . Chronic lower back pain   . COPD (chronic obstructive pulmonary disease) ([place])   . Depression   . Diverticulosis   . Dyspnea   . Dysrhythmia   . GERD (gastroesophageal reflux disease)   . Glaucoma   . Gout   . Heart disease   . 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 Hyperlipidemia   . Hypertension   . Hypothyroidism   . Macular degeneration of both eyes   . Neuropathy   . On home oxygen therapy    "3L; 24/7" (05/18/2017)  . Pre-diabetes   . 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 Pulmonary fibrosis ([place])   . Pulmonary fibrosis ([place])   . Rheumatoid arthritis ([place])   . Sleep apnea    uses 3 liters of o2 24/7  . Urticaria     Outpatient Medications Prior to Visit  Medication Sig Dispense Refill  . acetaminophen (TYLENOL) 500 MG tablet Take 500 mg by mouth every 6 (six) hours as needed (pain).    Marland Kitchen albuterol (PROVENTIL) (2.5 MG/3ML) 0.083% nebulizer solution Take 3 mLs (2.5 mg total) by nebulization every 6 (six) hours as needed for wheezing or shortness of breath. J96.01 150 mL 1  . albuterol (VENTOLIN HFA) 108 (90 Base) MCG/ACT inhaler TAKE 2 PUFFS BY MOUTH EVERY 6 HOURS AS NEEDED FOR WHEEZE OR SHORTNESS OF BREATH 8.5 g 0  . atenolol (TENORMIN) 25 MG tablet Take 1 tablet (25 mg total) by mouth daily. 30 tablet 6  . azelastine (ASTELIN) 0.1 % nasal spray Place 2 sprays into both nostrils 2 (two) times daily. Use in each nostril as directed 30 mL 12  . calcium citrate (CALCITRATE - DOSED IN MG ELEMENTAL CALCIUM) 950 MG tablet Take 1 tablet by mouth 2 (two) times daily.     .  cephALEXin (KEFLEX) 250 MG capsule Take 1 capsule (250 mg total) by mouth daily. 90 capsule 1  . cyanocobalamin 1000 MCG tablet Take 1,000 mcg by mouth daily. Vitamin b12 every AM    . diclofenac sodium (VOLTAREN) 1 % GEL Apply 2 g topically 4 (four) times daily. (Patient taking differently: Apply 2 g topically 4 (four) times daily. AS NEEDED) 100 g 3  . docusate sodium (COLACE) 100 MG capsule Take 200 mg by mouth daily as needed for mild constipation.    Marland Kitchen ELIQUIS 5 MG TABS tablet TAKE 1 TABLET BY MOUTH TWICE A DAY 180 tablet 0  . EPINEPHrine 0.3 mg/0.3 mL IJ SOAJ injection Inject 0.3 mg into the muscle as needed for anaphylaxis.    Marland Kitchen  estradiol (ESTRACE) 0.1 MG/GM vaginal cream PLACE 2.77 APPLICATORFUL VAGINALLY WEEKLY AS NEEDED AS DIRECTED 42.5 g 1  . famotidine (PEPCID) 20 MG tablet Take 1 tablet (20 mg total) by mouth 2 (two) times daily for 14 days. 180 tablet 1  . fluticasone (FLONASE) 50 MCG/ACT nasal spray PLACE 1 SPRAY INTO BOTH NOSTRILS 2 (TWO) TIMES DAILY AS NEEDED FOR ALLERGIES OR RHINITIS. 48 mL 1  . furosemide (LASIX) 40 MG tablet Take 1 tablet (40 mg total) by mouth 2 (two) times daily. 180 tablet 3  .     2  . levocetirizine (XYZAL) 5 MG tablet TAKE 1 TABLET BY MOUTH EVERY DAY IN THE EVENING 90 tablet 1  . levothyroxine (SYNTHROID) 125 MCG tablet Take 1 tablet (125 mcg total) by mouth daily. 90 tablet 3  . metFORMIN (GLUCOPHAGE-XR) 500 MG 24 hr tablet TAKE 1 TABLET BY MOUTH EVERY DAY WITH BREAKFAST 90 tablet 0  . methenamine (MANDELAMINE) 1 g tablet Take 1 tablet (1,000 mg total) by mouth 2 (two) times daily. 60 tablet 11  . Multiple Vitamins-Minerals (PRESERVISION AREDS 2) CAPS Take 1 capsule by mouth 2 (two) times daily.    . Olopatadine HCl (PATADAY) 0.2 % SOLN Place 1 drop into both eyes daily. 10 mL 5  . omeprazole (PRILOSEC) 20 MG capsule TAKE 1 CAPSULE BY MOUTH EVERY DAY 90 capsule 1  . ondansetron (ZOFRAN) 8 MG tablet TAKE 1 TABLET (8 MG TOTAL) BY MOUTH EVERY 8 (EIGHT) HOURS  AS NEEDED FOR NAUSEA OR VOMITING. 20 tablet 2  . OXYGEN Inhale 3 L into the lungs continuous.    Vladimir Faster Glycol-Propyl Glycol (SYSTANE) 0.4-0.3 % GEL ophthalmic gel Place 1 application into both eyes at bedtime as needed (dry eyes).    . predniSONE (DELTASONE) 5 MG tablet Take 1 tablet (5 mg total) by mouth daily with breakfast. 90 tablet 2  . rosuvastatin (CRESTOR) 10 MG tablet TAKE 1 TABLET BY MOUTH EVERYDAY AT BEDTIME 90 tablet 1  . sertraline (ZOLOFT) 50 MG tablet TAKE 1 TABLET BY MOUTH EVERY DAY 90 tablet 1  . spironolactone (ALDACTONE) 25 MG tablet TAKE 1 TABLET BY MOUTH EVERY DAY 90 tablet 0  . vitamin C (ASCORBIC ACID) 500 MG tablet Take 1,000 mg by mouth daily. AM    . Vitamin D, Ergocalciferol, (DRISDOL) 1.25 MG (50000 UNIT) CAPS capsule Take 1 capsule (50,000 Units total) by mouth every 7 (seven) days. 12 capsule 1  . vitamin E 400 UNIT capsule Take 400 Units by mouth daily. AM    . sulfamethoxazole-trimethoprim (BACTRIM DS) 800-160 MG tablet Take 1 tablet by mouth 2 (two) times daily. 10 tablet 0   No facility-administered medications prior to visit.     Objective:     BP 138/72 (BP Location: Left Arm, Patient Position: Sitting, Cuff Size: Large)   Pulse 89   Temp (!) 97.5 F (36.4 C) (Temporal)   Ht 5\' 7"  (1.702 m)   Wt 214 lb (97.1 kg)   SpO2 97%   BMI 33.52 kg/m   SpO2: 97 % O2 Type: Pulse O2 O2 Flow Rate (L/min): 3 L/min   elederly mod obese very slow moving wf nad   HEENT : pt wearing mask not removed for exam due to covid -19 concerns.    NECK :  without JVD/Nodes/TM/ nl carotid upstrokes bilaterally   LUNGS: no acc muscle use,  Nl contour chest with trace crackles in bases  bilaterally without cough on insp or exp maneuvers  CV:  RRR  no s3 or murmur or increase in P2, and no edema   ABD:  soft and nontender with nl inspiratory excursion in the supine position. No bruits or organomegaly appreciated, bowel sounds nl  MS:  Nl gait/ ext warm  without deformities, calf tenderness, cyanosis or clubbing No obvious joint restrictions   SKIN: warm and dry without lesions    NEURO:  alert, approp, nl sensorium with  no motor or cerebellar deficits apparent.     I personally reviewed images and agree with radiology impression as follows:   Chest HRCT  05/09/19  Pulmonary parenchymal pattern of fibrosis appears grossly stable from 03/23/2018 and may be due to usual interstitial pneumonitis or nonspecific interstitial pneumonitis. Findings are indeterminate for UIP per consensus guidelines     Assessment   Pulmonary fibrosis ([place]) PFT's  03/26/17    FVC  1.96 (62 % ) s obstruction DLCO  15.01 (53%) corrects to 4.22 (81%)  for alv volume and FV curve minimally concave and ERV 17%  -  03/19/2020   Walked RA  approx   100 ft  @ very slow pace  stopped due to  Legs gave out first, sats 94%     No clinical evidence of progression on prednisone per PCP with arthritis presumably RA which would fit here (RA ILD)  but so debilitated it's hard to tell clinically whether this is true but still her prognosis depends more on treating the RA than the secondary lung dz    >>>> so rec stay as active as possible and : Make sure you check your oxygen saturations at highest level of activity to be sure it stays over 90% and keep track of it at least once a week, more often if breathing getting worse, and let me know if losing ground.   >>> Rheum eval prn       Asthmatic bronchitis , chronic ([place]) PFTs obst 04/05/17  and pt never smoked so this is not copd  -03/19/2020  After extensive coaching inhaler device,  effectiveness =    75% with elipta  So try breo 200 for mild flare than maintain on breo 100 to prevent future flares         Chronic respiratory failure with hypoxia ([place]) As of 03/19/2020  - not desaturations walking 100 ft/ limited by legs   rec 3lpm hs and prn daytime for sats > 90% goal    I very strongly recommend you get the  moderna or pfizer vaccine as soon as possible based on your risk of dying from the virus  and the proven safety and benefit of these vaccines against even the delta variant.  This can save your life as well as  those of your loved ones,  especially if they are also not vaccinated.   She is fatalistic and does not wish for vent in event of worsening resp failure from any cause, witnessed by daughter in law.      Each maintenance medication was reviewed in detail including emphasizing most importantly the difference between maintenance and prns and under what circumstances the prns are to be triggered using an action plan format where appropriate.  Total time for H and P, chart review, counseling, teaching device  directly observing portions of ambulatory 02 saturation study/  and generating customized AVS unique to this office visit / charting  > 40 min           Christinia Gully, MD 03/19/2020

## 2020-03-19 NOTE — Assessment & Plan Note (Signed)
PFTs obst 04/05/17  and pt never smoked so this is not copd  -03/19/2020  After extensive coaching inhaler device,  effectiveness =    75% with elipta  So try breo 200 for mild flare than maintain on breo 100 to prevent future flares          Each maintenance medication was reviewed in detail including emphasizing most importantly the difference between maintenance and prns and under what circumstances the prns are to be triggered using an action plan format where appropriate.  Total time for H and P, chart review, counseling, teaching device  directly observing portions of ambulatory 02 saturation study/  and generating customized AVS unique to this office visit / charting  > 40 min

## 2020-03-19 NOTE — Patient Instructions (Addendum)
Plan A = Automatic = Always=    BREO  200 one each am (you take as many drags as want but only one click)   - if better over the next 2 weeks then fill the BREO 256 one click each am.   Plan B = Backup (to supplement plan A, not to replace it) Only use your albuterol inhaler as a rescue medication to be used if you can't catch your breath by resting or doing a relaxed purse lip breathing pattern.  - The less you use it, the better it will work when you need it. - Ok to use the inhaler up to 2 puffs  every 4 hours if you must but call for appointment if use goes up over your usual need - Don't leave home without it !!  (think of it like the spare tire for your car)   Plan C = Crisis (instead of Plan B but only if Plan B stops working) - only use your albuterol nebulizer if you first try Plan B and it fails to help > ok to use the nebulizer up to every 4 hours but if start needing it regularly call for immediate appointment   Plan D = Deltasone = Doubling  - deltasone is prednisone so double it if needing place C (the nebulizer)  much at all    Make sure you check your oxygen saturation at highest level of activity to be sure it stays over 90% and adjust  02 flow upward to maintain this level if needed but remember to turn it back to previous settings when you stop (to conserve your supply).    Please schedule a follow up visit in 6  months but call sooner if needed

## 2020-03-26 ENCOUNTER — Ambulatory Visit (INDEPENDENT_AMBULATORY_CARE_PROVIDER_SITE_OTHER): Payer: Medicare Other

## 2020-03-26 DIAGNOSIS — Z0000 Encounter for general adult medical examination without abnormal findings: Secondary | ICD-10-CM | POA: Diagnosis not present

## 2020-03-26 NOTE — Patient Instructions (Signed)
  [facility] Maintenance Summary and Written Plan of Care  Lorraine Leonard ,  Thank you for allowing me to perform your Medicare Annual Wellness Visit and for your ongoing commitment to your health.   Health Maintenance & Immunization History Health Maintenance  Topic Date Due  . OPHTHALMOLOGY EXAM  09/24/2020 (Originally 01/07/2020)  . COVID-19 Vaccine (1) 03/26/2021 (Originally 07/09/1952)  . HEMOGLOBIN A1C  08/23/2020  . URINE MICROALBUMIN  08/29/2020  . FOOT EXAM  02/23/2021  . TETANUS/TDAP  08/29/2029  . INFLUENZA VACCINE  Completed  . DEXA SCAN  Completed  . Hepatitis C Screening  Completed  . PNA vac Low Risk Adult  Completed   Immunization History  Administered Date(s) Administered  . Fluad Quad(high Dose 65+) 01/27/2019, 01/05/2020  . Influenza Split 01/20/2011, 12/22/2011  . Influenza, High Dose Seasonal PF 01/25/2014, 01/24/2015, 01/01/2016, 02/26/2018  . Influenza-Unspecified 01/19/2013  . Pneumococcal Conjugate-13 03/01/2015  . Pneumococcal Polysaccharide-23 04/21/2009  . Tdap 08/30/2019  . Zoster 10/20/2010  . Zoster Recombinat (Shingrix) 09/03/2016, 06/18/2017, 12/15/2017    These are the patient goals that we discussed: Goals Addressed   None      This is a list of Health Maintenance Items that are overdue or due now: There are no preventive care reminders to display for this patient.   Orders/Referrals Placed Today: No orders of the defined types were placed in this encounter.  (Contact our referral department at [phone] if you have not spoken with someone about your referral appointment within the next 5 days)    Follow-up Plan  Scheduled with Evelina Dun, FNP on 08/23/2020 at 2:00pm.

## 2020-03-26 NOTE — Progress Notes (Signed)
MEDICARE ANNUAL WELLNESS VISIT  03/26/2020  Telephone Visit Disclaimer This Medicare AWV was conducted by telephone due to national recommendations for restrictions regarding the COVID-19 Pandemic (e.g. social distancing).  I verified, using two identifiers, that I am speaking with Lorraine Leonard or their authorized healthcare agent. I discussed the limitations, risks, security, and privacy concerns of performing an evaluation and management service by telephone and the potential availability of an in-person appointment in the future. The patient expressed understanding and agreed to proceed.  Location of Patient: Home Location of Provider (nurse):  [facility]  Subjective:    Lorraine Leonard is a 79 y.o. female patient of Hawks, [name], FNP who had a Medicare Annual Wellness Visit today via telephone. Lorraine Leonard is Retired and lives with their spouse. she has three living children.One is deceased. she reports that she is socially active and does interact with friends/family regularly. she is not physically active and enjoys puzzles.  Patient Care Team: Sharion Balloon, FNP as PCP - General (Family Medicine) Evans Lance, MD as PCP - Electrophysiology (Cardiology) Harl Bowie Alphonse Guild, MD as PCP - Cardiology (Cardiology) Lamonte Sakai Rose Fillers, MD as Consulting Physician (Pulmonary Disease) Ilean China, RN as Registered Nurse  Advanced Directives 03/26/2020 12/01/2018 07/19/2018 06/04/2018 06/16/2017 05/18/2017 04/09/2017  Does Patient Have a Medical Advance Directive? No No No No No No No  Would patient like information on creating a medical advance directive? No - Patient declined Yes (MAU/Ambulatory/Procedural Areas - Information given) No - Patient declined - Yes (MAU/Ambulatory/Procedural Areas - Information given) No - Patient declined No - Patient declined    Hospital Utilization Over the Past 12 Months: # of hospitalizations or ER visits: 0 # of surgeries:  0  Review of Systems    Patient reports that her overall health is unchanged compared to last year.  Patient Reported Readings (BP, Pulse, CBG, Weight, etc) none  Pain Assessment Pain : 0-10 Pain Score: 5  Pain Location: Hip Pain Orientation: Right Pain Descriptors / Indicators: Aching Pain Onset: More than a month ago Pain Frequency: Several days a week     Current Medications & Allergies (verified) Allergies as of 03/26/2020      Reactions   Allopurinol Swelling   Feet and legs swell   Mobic [meloxicam] Swelling, Other (See Comments)   Feet and Leg swelling      Medication List       Accurate as of March 26, 2020  1:20 PM. If you have any questions, ask your nurse or doctor.        STOP taking these medications   cephALEXin 250 MG capsule Commonly known as: KEFLEX     TAKE these medications   acetaminophen 500 MG tablet Commonly known as: TYLENOL Take 500 mg by mouth every 6 (six) hours as needed (pain).   albuterol (2.5 MG/3ML) 0.083% nebulizer solution Commonly known as: PROVENTIL Take 3 mLs (2.5 mg total) by nebulization every 6 (six) hours as needed for wheezing or shortness of breath. J96.01   albuterol 108 (90 Base) MCG/ACT inhaler Commonly known as: VENTOLIN HFA TAKE 2 PUFFS BY MOUTH EVERY 6 HOURS AS NEEDED FOR WHEEZE OR SHORTNESS OF BREATH   atenolol 25 MG tablet Commonly known as: TENORMIN Take 1 tablet (25 mg total) by mouth daily.   azelastine 0.1 % nasal spray Commonly known as: ASTELIN Place 2 sprays into both nostrils 2 (two) times daily. Use in each nostril as directed  Breo Ellipta 100-25 MCG/INH Aepb Generic drug: fluticasone furoate-vilanterol Inhale 1 puff into the lungs every morning.   Breo Ellipta 200-25 MCG/INH Aepb Generic drug: fluticasone furoate-vilanterol Inhale 1 puff into the lungs daily.   calcium citrate 950 (200 Ca) MG tablet Commonly known as: CALCITRATE - dosed in mg elemental calcium Take 1 tablet by mouth  2 (two) times daily.   cyanocobalamin 1000 MCG tablet Take 1,000 mcg by mouth daily. Vitamin b12 every AM   diclofenac sodium 1 % Gel Commonly known as: Voltaren Apply 2 g topically 4 (four) times daily. What changed: additional instructions   docusate sodium 100 MG capsule Commonly known as: COLACE Take 200 mg by mouth daily as needed for mild constipation.   Eliquis 5 MG Tabs tablet Generic drug: apixaban TAKE 1 TABLET BY MOUTH TWICE A DAY   EPINEPHrine 0.3 mg/0.3 mL Soaj injection Commonly known as: EPI-PEN Inject 0.3 mg into the muscle as needed for anaphylaxis.   estradiol 0.1 MG/GM vaginal cream Commonly known as: ESTRACE PLACE 7.01 APPLICATORFUL VAGINALLY WEEKLY AS NEEDED AS DIRECTED   famotidine 20 MG tablet Commonly known as: PEPCID Take 1 tablet (20 mg total) by mouth 2 (two) times daily for 14 days.   fluticasone 50 MCG/ACT nasal spray Commonly known as: FLONASE PLACE 1 SPRAY INTO BOTH NOSTRILS 2 (TWO) TIMES DAILY AS NEEDED FOR ALLERGIES OR RHINITIS.   furosemide 40 MG tablet Commonly known as: Lasix Take 1 tablet (40 mg total) by mouth 2 (two) times daily.   levocetirizine 5 MG tablet Commonly known as: XYZAL TAKE 1 TABLET BY MOUTH EVERY DAY IN THE EVENING   levothyroxine 125 MCG tablet Commonly known as: SYNTHROID Take 1 tablet (125 mcg total) by mouth daily.   metFORMIN 500 MG 24 hr tablet Commonly known as: GLUCOPHAGE-XR TAKE 1 TABLET BY MOUTH EVERY DAY WITH BREAKFAST   methenamine 1 g tablet Commonly known as: MANDELAMINE Take 1 tablet (1,000 mg total) by mouth 2 (two) times daily.   Olopatadine HCl 0.2 % Soln Commonly known as: Pataday Place 1 drop into both eyes daily.   omeprazole 20 MG capsule Commonly known as: PRILOSEC TAKE 1 CAPSULE BY MOUTH EVERY DAY   ondansetron 8 MG tablet Commonly known as: ZOFRAN TAKE 1 TABLET (8 MG TOTAL) BY MOUTH EVERY 8 (EIGHT) HOURS AS NEEDED FOR NAUSEA OR VOMITING.   OXYGEN Inhale 3 L into the lungs  continuous.   predniSONE 5 MG tablet Commonly known as: DELTASONE Take 1 tablet (5 mg total) by mouth daily with breakfast.   PreserVision AREDS 2 Caps Take 1 capsule by mouth 2 (two) times daily.   rosuvastatin 10 MG tablet Commonly known as: CRESTOR TAKE 1 TABLET BY MOUTH EVERYDAY AT BEDTIME   sertraline 50 MG tablet Commonly known as: ZOLOFT TAKE 1 TABLET BY MOUTH EVERY DAY   spironolactone 25 MG tablet Commonly known as: ALDACTONE TAKE 1 TABLET BY MOUTH EVERY DAY   Systane 0.4-0.3 % Gel ophthalmic gel Generic drug: Polyethyl Glycol-Propyl Glycol Place 1 application into both eyes at bedtime as needed (dry eyes).   vitamin C 500 MG tablet Commonly known as: ASCORBIC ACID Take 1,000 mg by mouth daily. AM   Vitamin D (Ergocalciferol) 1.25 MG (50000 UNIT) Caps capsule Commonly known as: DRISDOL Take 1 capsule (50,000 Units total) by mouth every 7 (seven) days.   vitamin E 180 MG (400 UNITS) capsule Take 400 Units by mouth daily. AM       History (reviewed): Past Medical  History:  Diagnosis Date  . Acute diastolic CHF (congestive heart failure) ([place]) 11/11/2016  . Allergic rhinitis   . Anxiety   . Anxiety   . Arthritis    "~ all my joints" (05/18/2017)  . Atrial fibrillation ([place]) 09/2016  . Atrial fibrillation with RVR ([place]) 05/18/2017  . Chronic lower back pain   . COPD (chronic obstructive pulmonary disease) ([place])   . Depression   . Diverticulosis   . Dyspnea   . Dysrhythmia   . GERD (gastroesophageal reflux disease)   . Glaucoma   . Gout   . Heart disease   . 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 Hyperlipidemia   . Hypertension   . Hypothyroidism   . Macular degeneration of both eyes   . Neuropathy   . On home oxygen therapy    "3L; 24/7" (05/18/2017)  . Pre-diabetes   . 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 Pulmonary fibrosis ([place])   . Pulmonary fibrosis ([place])   . Rheumatoid arthritis ([place])    . Sleep apnea    uses 3 liters of o2 24/7  . Urticaria    Past Surgical History:  Procedure Laterality Date  . AV NODE ABLATION  05/18/2017  . AV NODE ABLATION N/A 05/18/2017   Procedure: AV NODE ABLATION;  Surgeon: Evans Lance, MD;  Location: [facility];  Service: Cardiovascular;  Laterality: N/A;  . BACK SURGERY    . BREAST LUMPECTOMY Left   . CARDIOVERSION N/A 01/30/2017   Procedure: CARDIOVERSION;  Surgeon: Josue Hector, MD;  [facility];  Service: Cardiovascular;  Laterality: N/A;  . CARDIOVERSION N/A 04/09/2017   Procedure: CARDIOVERSION;  Surgeon: Satira Sark, MD;  [facility];  Service: Cardiovascular;  Laterality: N/A;  . CATARACT EXTRACTION W/ INTRAOCULAR LENS  IMPLANT, BILATERAL Bilateral   . DILATION AND CURETTAGE OF UTERUS    . EUS N/A 10/03/2015   Procedure: UPPER ENDOSCOPIC ULTRASOUND (EUS) RADIAL;  Surgeon: Arta Silence, MD;  Location: [facility];  Service: Endoscopy;  Laterality: N/A;  . FINE NEEDLE ASPIRATION N/A 10/03/2015   Procedure: FINE NEEDLE ASPIRATION (FNA) RADIAL;  Surgeon: Arta Silence, MD;  Location: [facility];  Service: Endoscopy;  Laterality: N/A;  . INSERT / REPLACE / REMOVE PACEMAKER  05/18/2017  . LUMBAR LAMINECTOMY  1995; 06/23/2008   L4-5/notes 08/20/2010  . [place]   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  . NEPHRECTOMY TRANSPLANTED ORGAN    . PACEMAKER IMPLANT N/A 05/18/2017   Procedure: PACEMAKER IMPLANT;  Surgeon: Evans Lance, MD;  Location: [facility];  Service: Cardiovascular;  Laterality: N/A;  . TOTAL ABDOMINAL HYSTERECTOMY    . TUBAL LIGATION     Family History  Problem Relation Age of Onset  . Diabetes Other   . Hypertension Other   . Coronary artery disease Other   . Stroke Other   . Asthma Other   . Kidney disease Sister   . Cancer Sister        lung  . Lung cancer Sister   . Cancer Sister        lung  . Lung cancer Sister   . Cancer Sister         lung  . Suicidality Son        committed suicide in 2009  . CVA Son   . Hyperlipidemia Other   . Anxiety disorder  Other   . Migraines Other   . Heart failure Father   . Healthy Brother   . Cancer Sister        lung  . Healthy Sister    Social History   Socioeconomic History  . Marital status: Married    Spouse name: Mortimer Fries  . Number of children: 4  . Years of education: Not on file  . Highest education level: 9th grade  Occupational History  . Occupation: retired    Comment: cna  Tobacco Use  . Smoking status: Never Smoker  . Smokeless tobacco: Never Used  Vaping Use  . Vaping Use: Never used  Substance and Sexual Activity  . Alcohol use: No  . Drug use: No  . Sexual activity: Not Currently    Birth control/protection: Surgical  Other Topics Concern  . 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   Pt lives with her husband in a single wide trailer. They have 4 children and 13 grandchildren.    Social Determinants of Health   Financial Resource Strain:   . Difficulty of Paying Living Expenses: Not on file  Food Insecurity:   . Worried About Charity fundraiser in the Last Year: Not on file  . Ran Out of Food in the Last Year: Not on file  Transportation Needs:   . Lack of Transportation (Medical): Not on file  . Lack of Transportation (Non-Medical): Not on file  Physical Activity:   . Days of Exercise per Week: Not on file  . Minutes of Exercise per Session: Not on file  Stress:   . Feeling of Stress : Not on file  Social Connections:   . Frequency of Communication with Friends and Family: Not on file  . Frequency of Social Gatherings with Friends and Family: Not on file  . Attends Religious Services: Not on file  . Active Member of Clubs or Organizations: Not on file  . Attends Archivist Meetings: Not on file  . Marital Status: Not on file    Activities of Daily Living In your  present state of health, do you have any difficulty performing the following activities: 03/26/2020  Hearing? Y  Vision? N  Difficulty concentrating or making decisions? N  Walking or climbing stairs? Y  Dressing or bathing? N  Doing errands, shopping? N  Some recent data might be hidden    Patient Education/ Literacy How often do you need to have someone help you when you read instructions, pamphlets, or other written materials from your doctor or pharmacy?: 2 - Rarely What is the last grade level you completed in school?: 9th grade  Exercise Current Exercise Habits: The patient has a physically strenuous job, but has no regular exercise apart from work., Exercise limited by: orthopedic condition(s)  Diet Patient reports consuming 3 meals a day and 2 snack(s) a day Patient reports that her primary diet is: Regular Patient reports that she does have regular access to food.   Depression Screen PHQ 2/9 Scores 03/26/2020 02/24/2020 08/30/2019 01/27/2019 12/01/2018 10/28/2018 10/28/2018  PHQ - 2 Score 6 6 2  0 2 2 0  PHQ- 9 Score - 16 6 - 8 8 -     Fall Risk Fall Risk  03/26/2020 08/30/2019 01/27/2019 12/01/2018 10/28/2018  Falls in the past year? 1 0 0 1 0  Number falls in past yr: 0 - - 1 -  Injury with Fall? 0 - - 0 -  Comment - - - - -  Risk for fall due to : Impaired balance/gait - - Impaired balance/gait;Impaired mobility;History of fall(s) -  Follow up Falls evaluation completed - - - -     Objective:  Lorraine Leonard seemed alert and oriented and she participated appropriately during our telephone visit.  Blood Pressure Weight BMI  BP Readings from Last 3 Encounters:  03/19/20 138/72  03/09/20 (!) 148/60  02/28/20 (!) 150/71   Wt Readings from Last 3 Encounters:  03/19/20 214 lb (97.1 kg)  03/09/20 218 lb (98.9 kg)  02/28/20 218 lb (98.9 kg)   BMI Readings from Last 1 Encounters:  03/19/20 33.52 kg/m    *Unable to obtain current vital signs, weight, and BMI due to telephone  visit type  Hearing/Vision  . Lorraine Leonard did not seem to have difficulty with hearing/understanding during the telephone conversation . Reports that she has not had a formal eye exam by an eye care professional within the past year . Reports that she has not had a formal hearing evaluation within the past year *Unable to fully assess hearing and vision during telephone visit type  Cognitive Function: 6CIT Screen 03/26/2020 12/01/2018  What Year? 0 points 0 points  What month? 0 points 0 points  What time? 0 points 0 points  Count back from 20 0 points 2 points  Months in reverse 0 points 2 points  Repeat phrase 2 points 4 points  Total Score 2 8   (Normal:0-7, Significant for Dysfunction: >8)  Normal Cognitive Function Screening: Yes   Immunization & Health Maintenance Record Immunization History  Administered Date(s) Administered  . Fluad Quad(high Dose 65+) 01/27/2019, 01/05/2020  . Influenza Split 01/20/2011, 12/22/2011  . Influenza, High Dose Seasonal PF 01/25/2014, 01/24/2015, 01/01/2016, 02/26/2018  . Influenza-Unspecified 01/19/2013  . Pneumococcal Conjugate-13 03/01/2015  . Pneumococcal Polysaccharide-23 04/21/2009  . Tdap 08/30/2019  . Zoster 10/20/2010  . Zoster Recombinat (Shingrix) 09/03/2016, 06/18/2017, 12/15/2017    Health Maintenance  Topic Date Due  . OPHTHALMOLOGY EXAM  09/24/2020 (Originally 01/07/2020)  . COVID-19 Vaccine (1) 03/26/2021 (Originally 07/09/1952)  . HEMOGLOBIN A1C  08/23/2020  . URINE MICROALBUMIN  08/29/2020  . FOOT EXAM  02/23/2021  . TETANUS/TDAP  08/29/2029  . INFLUENZA VACCINE  Completed  . DEXA SCAN  Completed  . Hepatitis C Screening  Completed  . PNA vac Low Risk Adult  Completed       Assessment  This is a routine wellness examination for Lorraine Leonard.  Health Maintenance: Due or Overdue There are no preventive care reminders to display for this patient.  Lorraine Leonard does not need a referral for Community Assistance: Care  Management:   not applicable Social Work:    no Prescription Assistance:  no Nutrition/Diabetes Education:  no   Plan:  Personalized Goals Goals Addressed   None    Personalized Health Maintenance & Screening Recommendations    Lung Cancer Screening Recommended: no (Low Dose CT Chest recommended if Age 90-80 years, 30 pack-year currently smoking OR have quit w/in past 15 years) Hepatitis C Screening recommended: no HIV Screening recommended: no  Advanced Directives: Written information was not prepared per patient's request.  Referrals & Orders No orders of the defined types were placed in this encounter.   Follow-up Plan . Follow-up with Sharion Balloon, FNP as planned . Schedule 08/23/2020   I have personally reviewed and noted the following in the patient's chart:   .  Medical and social history . Use of alcohol, tobacco or illicit drugs  . Current medications and supplements . Functional ability and status . Nutritional status . Physical activity . Advanced directives . List of other physicians . Hospitalizations, surgeries, and ER visits in previous 12 months . Vitals . Screenings to include cognitive, depression, and falls . Referrals and appointments  In addition, I have reviewed and discussed with Lorraine Leonard certain preventive protocols, quality metrics, and best practice recommendations. A written personalized care plan for preventive services as well as general preventive health recommendations is available and can be mailed to the patient at her request.      Maud Deed [facility]  18/05/988

## 2020-03-27 ENCOUNTER — Other Ambulatory Visit: Payer: Self-pay | Admitting: Family

## 2020-03-27 DIAGNOSIS — I509 Heart failure, unspecified: Secondary | ICD-10-CM | POA: Diagnosis not present

## 2020-03-27 DIAGNOSIS — J449 Chronic obstructive pulmonary disease, unspecified: Secondary | ICD-10-CM | POA: Diagnosis not present

## 2020-03-27 DIAGNOSIS — J8410 Pulmonary fibrosis, unspecified: Secondary | ICD-10-CM | POA: Diagnosis not present

## 2020-04-27 DIAGNOSIS — J8410 Pulmonary fibrosis, unspecified: Secondary | ICD-10-CM | POA: Diagnosis not present

## 2020-04-27 DIAGNOSIS — J449 Chronic obstructive pulmonary disease, unspecified: Secondary | ICD-10-CM | POA: Diagnosis not present

## 2020-04-27 DIAGNOSIS — I509 Heart failure, unspecified: Secondary | ICD-10-CM | POA: Diagnosis not present

## 2020-04-29 ENCOUNTER — Other Ambulatory Visit: Payer: Self-pay | Admitting: Family

## 2020-05-16 ENCOUNTER — Telehealth: Payer: Self-pay | Admitting: Student

## 2020-05-16 NOTE — Telephone Encounter (Signed)
  Patient Consent for Virtual Visit         Lorraine Leonard has provided verbal consent on 05/16/2020 for a virtual visit (video or telephone).   CONSENT FOR VIRTUAL VISIT FOR:  Lorraine Leonard  By participating in this virtual visit I agree to the following:  I hereby voluntarily request, consent and authorize [facility] and its employed or contracted physicians, physician assistants, nurse practitioners or other licensed health care professionals (the Practitioner), to provide me with telemedicine health care services (the "Services") as deemed necessary by the treating Practitioner. I acknowledge and consent to receive the Services by the Practitioner via telemedicine. I understand that the telemedicine visit will involve communicating with the Practitioner through live audiovisual communication technology and the disclosure of certain medical information by electronic transmission. I acknowledge that I have been given the opportunity to request an in-person assessment or other available alternative prior to the telemedicine visit and am voluntarily participating in the telemedicine visit.  I understand that I have the right to withhold or withdraw my consent to the use of telemedicine in the course of my care at any time, without affecting my right to future care or treatment, and that the Practitioner or I may terminate the telemedicine visit at any time. I understand that I have the right to inspect all information obtained and/or recorded in the course of the telemedicine visit and may receive copies of available information for a reasonable fee.  I understand that some of the potential risks of receiving the Services via telemedicine include:  Marland Kitchen Delay or interruption in medical evaluation due to technological equipment failure or disruption; . Information transmitted may not be sufficient (e.g. poor resolution of images) to allow for appropriate medical decision making by the Practitioner;  and/or  . In rare instances, security protocols could fail, causing a breach of personal health information.  Furthermore, I acknowledge that it is my responsibility to provide information about my medical history, conditions and care that is complete and accurate to the best of my ability. I acknowledge that Practitioner's advice, recommendations, and/or decision may be based on factors not within their control, such as incomplete or inaccurate data provided by me or distortions of diagnostic images or specimens that may result from electronic transmissions. I understand that the practice of medicine is not an exact science and that Practitioner makes no warranties or guarantees regarding treatment outcomes. I acknowledge that a copy of this consent can be made available to me via my patient portal ([facility] [place]), or I can request a printed copy by calling the office of [facility].    I understand that my insurance will be billed for this visit.   I have read or had this consent read to me. . I understand the contents of this consent, which adequately explains the benefits and risks of the Services being provided via telemedicine.  . I have been provided ample opportunity to ask questions regarding this consent and the Services and have had my questions answered to my satisfaction. . I give my informed consent for the services to be provided through the use of telemedicine in my medical care

## 2020-05-21 ENCOUNTER — Other Ambulatory Visit: Payer: Self-pay | Admitting: Family

## 2020-05-24 ENCOUNTER — Encounter: Payer: Self-pay | Admitting: Student

## 2020-05-24 ENCOUNTER — Telehealth (INDEPENDENT_AMBULATORY_CARE_PROVIDER_SITE_OTHER): Payer: Medicare Other | Admitting: Student

## 2020-05-24 ENCOUNTER — Other Ambulatory Visit: Payer: Self-pay

## 2020-05-24 VITALS — BP 115/70 | Ht 67.0 in | Wt 214.3 lb

## 2020-05-24 DIAGNOSIS — E782 Mixed hyperlipidemia: Secondary | ICD-10-CM | POA: Diagnosis not present

## 2020-05-24 DIAGNOSIS — I480 Paroxysmal atrial fibrillation: Secondary | ICD-10-CM

## 2020-05-24 DIAGNOSIS — J8410 Pulmonary fibrosis, unspecified: Secondary | ICD-10-CM | POA: Diagnosis not present

## 2020-05-24 DIAGNOSIS — I5032 Chronic diastolic (congestive) heart failure: Secondary | ICD-10-CM | POA: Diagnosis not present

## 2020-05-24 DIAGNOSIS — I10 Essential (primary) hypertension: Secondary | ICD-10-CM | POA: Diagnosis not present

## 2020-05-24 NOTE — Patient Instructions (Signed)
Medication Instructions:  Your physician recommends that you continue on your current medications as directed. Please refer to the Current Medication list given to you today.  *If you need a refill on your cardiac medications before your next appointment, please call your pharmacy*   Lab Work: None Today If you have labs (blood work) drawn today and your tests are completely normal, you will receive your results only by: Marland Kitchen MyChart Message (if you have MyChart) OR . A paper copy in the mail If you have any lab test that is abnormal or we need to change your treatment, we will call you to review the results.   Testing/Procedures: None Today   Follow-Up: At [facility], you and your health needs are our priority.  As part of our continuing mission to provide you with exceptional heart care, we have created designated Provider Care Teams.  These Care Teams include your primary Cardiologist (physician) and Advanced Practice Providers (APPs -  Physician Assistants and Nurse Practitioners) who all work together to provide you with the care you need, when you need it.  We recommend signing up for the patient portal called "MyChart".  Sign up information is provided on this After Visit Summary.  MyChart is used to connect with patients for Virtual Visits (Telemedicine).  Patients are able to view lab/test results, encounter notes, upcoming appointments, etc.  Non-urgent messages can be sent to your provider as well.   To learn more about what you can do with MyChart, go to [email or web address].    Your next appointment:   3-4 month(s)  The format for your next appointment:   In Person  Provider:   Carlyle Dolly, MD   Other Instructions

## 2020-05-24 NOTE — Progress Notes (Signed)
Virtual Visit via Telephone Note   This visit type was conducted due to national recommendations for restrictions regarding the COVID-19 Pandemic (e.g. social distancing) in an effort to limit this patient's exposure and mitigate transmission in our community.  Due to her co-morbid illnesses, this patient is at least at moderate risk for complications without adequate follow up.  This format is felt to be most appropriate for this patient at this time.  The patient did not have access to video technology/had technical difficulties with video requiring transitioning to audio format only (telephone).  All issues noted in this document were discussed and addressed.  No physical exam could be performed with this format.  Please refer to the patient's chart for her  consent to telehealth for [facility].    Date:  05/24/2020   ID:  Lorraine Leonard, DOB 05/23/1940, MRN [id number] The patient was identified using 2 identifiers.  Patient Location: Home Provider Location: Office/Clinic  PCP:  Sharion Balloon, FNP  Cardiologist:  Carlyle Dolly, MD  Electrophysiologist:  Cristopher Peru, MD   Evaluation Performed:  Follow-Up Visit  Chief Complaint: Still having fatigue and dyspnea on exertion  History of Present Illness:    Lorraine Leonard is a 80 y.o. female with past medical history of paroxysmal atrial fibrillation (complicated by bradycardia and s/p AV node ablation with PPM placement in 04/2017), chronic diastolic CHF, HTN, HLD and pulmonary fibrosis (on PRN supplemental oxygen) who presents for a 69-month follow-up telehealth visit.   She was last examined by Dr. Harl Bowie in 12/2019 and denied any recent palpitations at that time. Did have occasional episodes of chest pain which would only last for seconds at a time and could occur at rest or with activity. She appeared euvolemic and was continued on Lasix 40mg  daily. She did see Dr. Lovena Le in 02/2020 and device interrogation showed she was  pacemaker dependent, therefore her rate was turned up from 50 to 70 to help with cardiac output.   In talking with the patient today, she reports feeling as if her fatigue and dyspnea on exertion have progressed since her device was reprogrammed in 02/2020. She does have pulmonary fibrosis but says her oxygen levels have been stable and she only uses her supplemental oxygen as needed. No recent orthopnea, PND or edema. She denies any chest pain or palpitations. She questions if her dyspnea is secondary to her "nerves" as she was previously on Valium but says this was discontinued by her PCP and she does feel anxious at times.   The patient does not have symptoms concerning for COVID-19 infection (fever, chills, cough, or new shortness of breath).    Past Medical History:  Diagnosis Date  . Acute diastolic CHF (congestive heart failure) ([place]) 11/11/2016  . Allergic rhinitis   . Anxiety   . Anxiety   . Arthritis    "~ all my joints" (05/18/2017)  . Atrial fibrillation ([place]) 09/2016  . Atrial fibrillation with RVR ([place]) 05/18/2017  . Chronic lower back pain   . COPD (chronic obstructive pulmonary disease) ([place])   . Depression   . Diverticulosis   . Dyspnea   . Dysrhythmia   . GERD (gastroesophageal reflux disease)   . Glaucoma   . Gout   . Heart disease   . 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 Hyperlipidemia   . Hypertension   . Hypothyroidism   . Macular degeneration of both eyes   .  Neuropathy   . On home oxygen therapy    "3L; 24/7" (05/18/2017)  . Pre-diabetes   . 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 Pulmonary fibrosis ([place])   . Pulmonary fibrosis ([place])   . Rheumatoid arthritis ([place])   . Sleep apnea    uses 3 liters of o2 24/7  . Urticaria    Past Surgical History:  Procedure Laterality Date  . AV NODE ABLATION  05/18/2017  . AV NODE ABLATION N/A 05/18/2017   Procedure: AV NODE ABLATION;  Surgeon: Evans Lance,  MD;  Location: [place] [facility];  Service: Cardiovascular;  Laterality: N/A;  . BACK SURGERY    . BREAST LUMPECTOMY Left   . CARDIOVERSION N/A 01/30/2017   Procedure: CARDIOVERSION;  Surgeon: Josue Hector, MD;  [facility];  Service: Cardiovascular;  Laterality: N/A;  . CARDIOVERSION N/A 04/09/2017   Procedure: CARDIOVERSION;  Surgeon: Satira Sark, MD;  Location: [facility];  Service: Cardiovascular;  Laterality: N/A;  . CATARACT EXTRACTION W/ INTRAOCULAR LENS  IMPLANT, BILATERAL Bilateral   . DILATION AND CURETTAGE OF UTERUS    . EUS N/A 10/03/2015   Procedure: UPPER ENDOSCOPIC ULTRASOUND (EUS) RADIAL;  Surgeon: Arta Silence, MD;  Location: [facility];  Service: Endoscopy;  Laterality: N/A;  . FINE NEEDLE ASPIRATION N/A 10/03/2015   Procedure: FINE NEEDLE ASPIRATION (FNA) RADIAL;  Surgeon: Arta Silence, MD;  Location: [facility];  Service: Endoscopy;  Laterality: N/A;  . INSERT / REPLACE / REMOVE PACEMAKER  05/18/2017  . LUMBAR LAMINECTOMY  1995; 06/23/2008   L4-5/notes 08/20/2010  . [place]   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  . NEPHRECTOMY TRANSPLANTED ORGAN    . PACEMAKER IMPLANT N/A 05/18/2017   Procedure: PACEMAKER IMPLANT;  Surgeon: Evans Lance, MD;  Location: [place] CV LAB;  Service: Cardiovascular;  Laterality: N/A;  . TOTAL ABDOMINAL HYSTERECTOMY    . TUBAL LIGATION       Current Meds  Medication Sig  . acetaminophen (TYLENOL) 500 MG tablet Take 500 mg by mouth every 6 (six) hours as needed (pain).  Marland Kitchen albuterol (PROVENTIL) (2.5 MG/3ML) 0.083% nebulizer solution Take 3 mLs (2.5 mg total) by nebulization every 6 (six) hours as needed for wheezing or shortness of breath. J96.01  . albuterol (VENTOLIN HFA) 108 (90 Base) MCG/ACT inhaler TAKE 2 PUFFS BY MOUTH EVERY 6 HOURS AS NEEDED FOR WHEEZE OR SHORTNESS OF BREATH  . atenolol (TENORMIN) 25 MG tablet Take 1 tablet (25 mg total) by mouth daily.  Marland Kitchen azelastine (ASTELIN) 0.1 %  nasal spray Place 2 sprays into both nostrils 2 (two) times daily. Use in each nostril as directed  . calcium citrate (CALCITRATE - DOSED IN MG ELEMENTAL CALCIUM) 950 MG tablet Take 1 tablet by mouth 2 (two) times daily.   . cyanocobalamin 1000 MCG tablet Take 1,000 mcg by mouth daily. Vitamin b12 every AM  . diclofenac sodium (VOLTAREN) 1 % GEL Apply 2 g topically 4 (four) times daily. (Patient taking differently: Apply 2 g topically 4 (four) times daily. AS NEEDED)  . docusate sodium (COLACE) 100 MG capsule Take 200 mg by mouth daily as needed for mild constipation.  Marland Kitchen ELIQUIS 5 MG TABS tablet TAKE 1 TABLET BY MOUTH TWICE A DAY  . EPINEPHrine 0.3 mg/0.3 mL IJ SOAJ injection Inject 0.3 mg into the muscle as needed for anaphylaxis.  Marland Kitchen estradiol (ESTRACE) 0.1 MG/GM vaginal cream PLACE 2.95 APPLICATORFUL VAGINALLY WEEKLY AS NEEDED AS DIRECTED  .  fluticasone (FLONASE) 50 MCG/ACT nasal spray PLACE 1 SPRAY INTO BOTH NOSTRILS 2 (TWO) TIMES DAILY AS NEEDED FOR ALLERGIES OR RHINITIS.  . fluticasone furoate-vilanterol (BREO ELLIPTA) 100-25 MCG/INH AEPB Inhale 1 puff into the lungs every morning.  . fluticasone furoate-vilanterol (BREO ELLIPTA) 200-25 MCG/INH AEPB Inhale 1 puff into the lungs daily.  . furosemide (LASIX) 40 MG tablet Take 1 tablet (40 mg total) by mouth 2 (two) times daily.  Marland Kitchen levocetirizine (XYZAL) 5 MG tablet TAKE 1 TABLET BY MOUTH EVERY DAY IN THE EVENING  . levothyroxine (SYNTHROID) 125 MCG tablet Take 1 tablet (125 mcg total) by mouth daily.  . metFORMIN (GLUCOPHAGE-XR) 500 MG 24 hr tablet TAKE 1 TABLET BY MOUTH EVERY DAY WITH BREAKFAST  . methenamine (MANDELAMINE) 1 g tablet Take 1 tablet (1,000 mg total) by mouth 2 (two) times daily.  . Multiple Vitamins-Minerals (PRESERVISION AREDS 2) CAPS Take 1 capsule by mouth 2 (two) times daily.  . Olopatadine HCl 0.2 % SOLN INSTILL 1 DROP INTO BOTH EYES EVERY DAY  . omeprazole (PRILOSEC) 20 MG capsule TAKE 1 CAPSULE BY MOUTH EVERY DAY  .  ondansetron (ZOFRAN) 8 MG tablet TAKE 1 TABLET (8 MG TOTAL) BY MOUTH EVERY 8 (EIGHT) HOURS AS NEEDED FOR NAUSEA OR VOMITING.  . OXYGEN Inhale 3 L into the lungs continuous.  Vladimir Faster Glycol-Propyl Glycol (SYSTANE) 0.4-0.3 % GEL ophthalmic gel Place 1 application into both eyes at bedtime as needed (dry eyes).  . predniSONE (DELTASONE) 5 MG tablet Take 1 tablet (5 mg total) by mouth daily with breakfast.  . rosuvastatin (CRESTOR) 10 MG tablet TAKE 1 TABLET BY MOUTH EVERYDAY AT BEDTIME  . sertraline (ZOLOFT) 50 MG tablet TAKE 1 TABLET BY MOUTH EVERY DAY  . spironolactone (ALDACTONE) 25 MG tablet TAKE 1 TABLET BY MOUTH EVERY DAY  . vitamin C (ASCORBIC ACID) 500 MG tablet Take 1,000 mg by mouth daily. AM  . Vitamin D, Ergocalciferol, (DRISDOL) 1.25 MG (50000 UNIT) CAPS capsule Take 1 capsule (50,000 Units total) by mouth every 7 (seven) days.  . vitamin E 400 UNIT capsule Take 400 Units by mouth daily. AM     Allergies:   Allopurinol and Mobic [meloxicam]   Social History   Tobacco Use  . Smoking status: Never Smoker  . Smokeless tobacco: Never Used  Vaping Use  . Vaping Use: Never used  Substance Use Topics  . Alcohol use: No  . Drug use: No     Family Hx: The patient's family history includes Anxiety disorder in an other family member; Asthma in an other family member; CVA in her son; Cancer in her sister, sister, sister, and sister; Coronary artery disease in an other family member; Diabetes in an other family member; Healthy in her brother and sister; Heart failure in her father; Hyperlipidemia in an other family member; Hypertension in an other family member; Kidney disease in her sister; Lung cancer in her sister and sister; Migraines in an other family member; Stroke in an other family member; Suicidality in her son.  ROS:   Please see the history of present illness.     All other systems reviewed and are negative.   Prior CV studies:   The following studies were reviewed  today:  Echocardiogram: 01/2017 Study Conclusions   - Left ventricle: The cavity size was normal. Wall thickness was  increased in a pattern of moderate LVH. Systolic function was  normal. The estimated ejection fraction was in the range of 55%  to 60%. Wall  motion was normal; there were no regional wall  motion abnormalities.  - Mitral valve: Mildly calcified leaflets . There was trivial  regurgitation.  - Right ventricle: Systolic function was low normal.  - Right atrium: Central venous pressure (est): 3 mm Hg.  - Tricuspid valve: There was mild regurgitation.  - Pulmonary arteries: PA peak pressure: 26 mm Hg (S).  - Pericardium, extracardiac: There was no pericardial effusion.   Impressions:   - Moderate LVH with LVEF 55-60% and indeterminate diastolic  function in the setting of atrial fibrillation. Mildly calcified  mitral annulus with trivial mitral regurgitation. Mild tricuspid  regurgitation with PASP 26 mmHg.   Labs/Other Tests and Data Reviewed:    EKG:  No ECG reviewed.  Recent Labs: 08/30/2019: Hemoglobin 10.6; Platelets 294 02/24/2020: ALT 15; BUN 20; Creatinine, Ser 1.34; Potassium 4.7; Sodium 139; TSH 0.920   Recent Lipid Panel Lab Results  Component Value Date/Time   CHOL 147 05/03/2019 01:07 PM   TRIG 206 (H) 05/03/2019 01:07 PM   TRIG 169 (H) 08/17/2014 11:36 AM   HDL 49 05/03/2019 01:07 PM   HDL 44 08/17/2014 11:36 AM   CHOLHDL 3.0 05/03/2019 01:07 PM   CHOLHDL 3.1 02/13/2017 04:30 AM   LDLCALC 64 05/03/2019 01:07 PM    Wt Readings from Last 3 Encounters:  05/24/20 214 lb 4.8 oz (97.2 kg)  03/19/20 214 lb (97.1 kg)  03/09/20 218 lb (98.9 kg)     Risk Assessment/Calculations:    CHA2DS2-VASc Score = 5  This indicates a 7.2% annual risk of stroke. The patient's score is based upon: CHF History: No HTN History: Yes Diabetes History: Yes Stroke History: No Vascular Disease History: No Age Score: 2 Gender Score: 1     Objective:    Vital Signs:  BP 115/70   Ht 5\' 7"  (1.702 m)   Wt 214 lb 4.8 oz (97.2 kg)   BMI 33.56 kg/m    General: Pleasant female sounding in NAD Psych: Normal affect. Neuro: Alert and oriented X 3.  Lungs:  Resp regular and unlabored while talking on the phone.   ASSESSMENT & PLAN:    1. Paroxysmal Atrial Fibrillation - She previously underwent AV nodal ablation with PPM placement in 04/2017. Her device was reprogrammed in 02/2020 with her rate increased from 50 to 70 given she was pacemaker dependent. She feels like her dyspnea and fatigue have progressed since. I will send a message to the device clinic today to see if a remote interrogation can be arranged. If PPM interrogation unrevealing, would consider a repeat echocardiogram for initial assessment. I encouraged her to talk to her PCP about her anxiety as mentioned above for this could be contributing as well.  - She denies any evidence of active bleeding. Continue Eliquis 5 mg BID.   2. Chronic Diastolic CHF - She denies any recent orthopnea, PND or edema. Remains on Lasix 40mg  daily and does take an extra tablet if needed for edema. Creatinine was stable at 1.34 by recent labs in 02/2020.  3. HTN - BP was well-controlled at 115/70 on most recent check. Continue current regimen with Atenolol 25mg  daily and Spironolactone 25mg  daily.   4. HLD - Followed by PCP. LDL was at 64 on most recent check. She remains on Crestor 10mg  daily.  5. Pulmonary Fibrosis - Followed by Dr. Melvyn Novas. She uses supplemental oxygen as needed but says her oxygen saturations have overall remained stable.      COVID-19 Education: The signs and symptoms  of COVID-19 were discussed with the patient and how to seek care for testing (follow up with PCP or arrange E-visit).  The importance of social distancing was discussed today.  Time:   Today, I have spent 13 minutes with the patient with telehealth technology discussing the above problems.      Medication Adjustments/Labs and Tests Ordered: Current medicines are reviewed at length with the patient today.  Concerns regarding medicines are outlined above.   Tests Ordered: No orders of the defined types were placed in this encounter.   Medication Changes: No orders of the defined types were placed in this encounter.   Follow Up:  In Person in 3 month(s)  Signed, Erma Heritage, PA-C  05/24/2020 8:09 PM    [place]

## 2020-05-25 ENCOUNTER — Telehealth: Payer: Self-pay

## 2020-05-25 NOTE — Telephone Encounter (Signed)
Patient reports she has been SOB with activity and dizziness for the past 2 months after rate response turned on and lower HR dropped to 70 bpm. Patient reports no CP. No SOB at rest, no chest tightness. Unable to get remote transmission due to monitor issue. Reports she had pain in popliteal area of RLE x 7 days ago and RLE turned "different colors."  She reports RLE is " better". Recommended she contact PCP about RLE issue, + Eliquis. Will forward to scheduling to have patient placed on EP APP schedule or DC to assess device and give patient new monitor at the visit.

## 2020-05-25 NOTE — Telephone Encounter (Signed)
The patient is not getting power to the monitor. I have a monitor in the office that I can give the patient. I let the patient speak with device nurse Jenny Reichmann, rn.

## 2020-05-25 NOTE — Telephone Encounter (Signed)
-----   Message from Erma Heritage, [place] sent at 05/24/2020  8:08 PM EST ----- Regarding: Remote Device Interrogation Good evening,   Can you please arrange for this patient to have a remote device interrogation? She reported progressive fatigue and dyspnea since her device was reprogrammed in 02/2020. Thanks for your help!  [place],  [place]

## 2020-05-25 NOTE — Telephone Encounter (Signed)
Patient's grandaughter Chastity (DPR) reports that patient has been anxious about her husbands condition , hospice involved with care. Reports she will need afternoon appointment but condition appears to be improved and patient has been more active since ppm changes in November. Aware appointment needs to be in afternoon and that it will be in [place].

## 2020-05-25 NOTE — Telephone Encounter (Signed)
The patient granddaughter Carolyne Fiscal (dpr) is going to call the patient to get her to send the transmission. I gave device clinic direct number to call if they need help.

## 2020-05-28 DIAGNOSIS — J8410 Pulmonary fibrosis, unspecified: Secondary | ICD-10-CM | POA: Diagnosis not present

## 2020-05-28 DIAGNOSIS — J449 Chronic obstructive pulmonary disease, unspecified: Secondary | ICD-10-CM | POA: Diagnosis not present

## 2020-05-28 DIAGNOSIS — I509 Heart failure, unspecified: Secondary | ICD-10-CM | POA: Diagnosis not present

## 2020-05-29 ENCOUNTER — Ambulatory Visit: Payer: Medicare Other | Admitting: Family

## 2020-05-29 ENCOUNTER — Other Ambulatory Visit: Payer: Self-pay | Admitting: Family

## 2020-05-31 ENCOUNTER — Encounter: Payer: Self-pay | Admitting: Family

## 2020-06-02 ENCOUNTER — Other Ambulatory Visit: Payer: Self-pay | Admitting: Family

## 2020-06-05 ENCOUNTER — Other Ambulatory Visit: Payer: Self-pay

## 2020-06-05 ENCOUNTER — Ambulatory Visit (INDEPENDENT_AMBULATORY_CARE_PROVIDER_SITE_OTHER): Payer: Medicare Other | Admitting: Internal Medicine

## 2020-06-05 ENCOUNTER — Encounter: Payer: Self-pay | Admitting: Internal Medicine

## 2020-06-05 ENCOUNTER — Ambulatory Visit (INDEPENDENT_AMBULATORY_CARE_PROVIDER_SITE_OTHER): Payer: Medicare Other

## 2020-06-05 VITALS — BP 112/62 | HR 72 | Ht 67.0 in | Wt 214.6 lb

## 2020-06-05 DIAGNOSIS — I4821 Permanent atrial fibrillation: Secondary | ICD-10-CM

## 2020-06-05 DIAGNOSIS — I5032 Chronic diastolic (congestive) heart failure: Secondary | ICD-10-CM | POA: Diagnosis not present

## 2020-06-05 DIAGNOSIS — J8410 Pulmonary fibrosis, unspecified: Secondary | ICD-10-CM

## 2020-06-05 DIAGNOSIS — I4891 Unspecified atrial fibrillation: Secondary | ICD-10-CM

## 2020-06-05 NOTE — Progress Notes (Signed)
HPI Mrs. Johansson returns today for followup. She is a chronically ill 80 yo woman with a h/o chronic diastolic heart failure, uncontrolled atrial fib s/p AV node ablation and PPM insertion. When we saw her last she was noted to be PM dependent and we turned her HR up to 70 from 50/min. She notes that she has had more fatigue and dyspnea. She has lost 4 more pounds in the interim. She notes that she feels ok at rest but when she gets up and walks her HR goes up to a 100/min very quickly.  Allergies  Allergen Reactions  . Allopurinol Swelling    Feet and legs swell  . Mobic [Meloxicam] Swelling and Other (See Comments)    Feet and Leg swelling     Current Outpatient Medications  Medication Sig Dispense Refill  . acetaminophen (TYLENOL) 500 MG tablet Take 500 mg by mouth every 6 (six) hours as needed (pain).    Marland Kitchen albuterol (PROVENTIL) (2.5 MG/3ML) 0.083% nebulizer solution Take 3 mLs (2.5 mg total) by nebulization every 6 (six) hours as needed for wheezing or shortness of breath. J96.01 150 mL 1  . albuterol (VENTOLIN HFA) 108 (90 Base) MCG/ACT inhaler TAKE 2 PUFFS BY MOUTH EVERY 6 HOURS AS NEEDED FOR WHEEZE OR SHORTNESS OF BREATH 8.5 g 0  . atenolol (TENORMIN) 25 MG tablet Take 1 tablet (25 mg total) by mouth daily. 30 tablet 6  . azelastine (ASTELIN) 0.1 % nasal spray Place 2 sprays into both nostrils 2 (two) times daily. Use in each nostril as directed 30 mL 12  . calcium citrate (CALCITRATE - DOSED IN MG ELEMENTAL CALCIUM) 950 MG tablet Take 1 tablet by mouth 2 (two) times daily.     . cyanocobalamin 1000 MCG tablet Take 1,000 mcg by mouth daily. Vitamin b12 every AM    . diclofenac sodium (VOLTAREN) 1 % GEL Apply 2 g topically 4 (four) times daily. (Patient taking differently: Apply 2 g topically 4 (four) times daily. AS NEEDED) 100 g 3  . docusate sodium (COLACE) 100 MG capsule Take 200 mg by mouth daily as needed for mild constipation.    Marland Kitchen ELIQUIS 5 MG TABS tablet TAKE 1 TABLET BY  MOUTH TWICE A DAY 180 tablet 0  . EPINEPHrine 0.3 mg/0.3 mL IJ SOAJ injection Inject 0.3 mg into the muscle as needed for anaphylaxis.    Marland Kitchen estradiol (ESTRACE) 0.1 MG/GM vaginal cream PLACE 6.21 APPLICATORFUL VAGINALLY WEEKLY AS NEEDED AS DIRECTED 42.5 g 1  . famotidine (PEPCID) 20 MG tablet Take 1 tablet (20 mg total) by mouth 2 (two) times daily for 14 days. 180 tablet 1  . fluticasone (FLONASE) 50 MCG/ACT nasal spray PLACE 1 SPRAY INTO BOTH NOSTRILS 2 (TWO) TIMES DAILY AS NEEDED FOR ALLERGIES OR RHINITIS. 48 mL 1  . fluticasone furoate-vilanterol (BREO ELLIPTA) 100-25 MCG/INH AEPB Inhale 1 puff into the lungs every morning. 28 each 11  . fluticasone furoate-vilanterol (BREO ELLIPTA) 200-25 MCG/INH AEPB Inhale 1 puff into the lungs daily. 28 each 0  . furosemide (LASIX) 40 MG tablet Take 1 tablet (40 mg total) by mouth 2 (two) times daily. 180 tablet 3  . levocetirizine (XYZAL) 5 MG tablet TAKE 1 TABLET BY MOUTH EVERY DAY IN THE EVENING 90 tablet 1  . levothyroxine (SYNTHROID) 125 MCG tablet Take 1 tablet (125 mcg total) by mouth daily. 90 tablet 3  . metFORMIN (GLUCOPHAGE-XR) 500 MG 24 hr tablet TAKE 1 TABLET BY MOUTH EVERY DAY WITH  BREAKFAST 90 tablet 0  . methenamine (MANDELAMINE) 1 g tablet Take 1 tablet (1,000 mg total) by mouth 2 (two) times daily. 60 tablet 11  . Multiple Vitamins-Minerals (PRESERVISION AREDS 2) CAPS Take 1 capsule by mouth 2 (two) times daily.    . Olopatadine HCl 0.2 % SOLN INSTILL 1 DROP INTO BOTH EYES EVERY DAY 10 mL 5  . omeprazole (PRILOSEC) 20 MG capsule TAKE 1 CAPSULE BY MOUTH EVERY DAY 90 capsule 1  . ondansetron (ZOFRAN) 8 MG tablet TAKE 1 TABLET (8 MG TOTAL) BY MOUTH EVERY 8 (EIGHT) HOURS AS NEEDED FOR NAUSEA OR VOMITING. 20 tablet 2  . OXYGEN Inhale 3 L into the lungs continuous.    Vladimir Faster Glycol-Propyl Glycol (SYSTANE) 0.4-0.3 % GEL ophthalmic gel Place 1 application into both eyes at bedtime as needed (dry eyes).    . predniSONE (DELTASONE) 5 MG tablet  TAKE 1 TABLET BY MOUTH EVERY DAY WITH BREAKFAST 90 tablet 2  . rosuvastatin (CRESTOR) 10 MG tablet TAKE 1 TABLET BY MOUTH EVERYDAY AT BEDTIME 90 tablet 1  . sertraline (ZOLOFT) 50 MG tablet TAKE 1 TABLET BY MOUTH EVERY DAY 90 tablet 1  . spironolactone (ALDACTONE) 25 MG tablet TAKE 1 TABLET BY MOUTH EVERY DAY 90 tablet 0  . vitamin C (ASCORBIC ACID) 500 MG tablet Take 1,000 mg by mouth daily. AM    . Vitamin D, Ergocalciferol, (DRISDOL) 1.25 MG (50000 UNIT) CAPS capsule Take 1 capsule (50,000 Units total) by mouth every 7 (seven) days. 12 capsule 1  . vitamin E 400 UNIT capsule Take 400 Units by mouth daily. AM     No current facility-administered medications for this visit.     Past Medical History:  Diagnosis Date  . Acute diastolic CHF (congestive heart failure) ([place]) 11/11/2016  . Allergic rhinitis   . Anxiety   . Anxiety   . Arthritis    "~ all my joints" (05/18/2017)  . Atrial fibrillation ([place]) 09/2016  . Atrial fibrillation with RVR ([place]) 05/18/2017  . Chronic lower back pain   . COPD (chronic obstructive pulmonary disease) ([place])   . Depression   . Diverticulosis   . Dyspnea   . Dysrhythmia   . GERD (gastroesophageal reflux disease)   . Glaucoma   . Gout   . Heart disease   . 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 Hyperlipidemia   . Hypertension   . Hypothyroidism   . Macular degeneration of both eyes   . Neuropathy   . On home oxygen therapy    "3L; 24/7" (05/18/2017)  . Pre-diabetes   . 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 Pulmonary fibrosis ([place])   . Pulmonary fibrosis ([place])   . Rheumatoid arthritis ([place])   . Sleep apnea    uses 3 liters of o2 24/7  . Urticaria     ROS:   All systems reviewed and negative except as noted in the HPI.   Past Surgical History:  Procedure Laterality Date  . AV NODE ABLATION  05/18/2017  . AV NODE ABLATION N/A 05/18/2017   Procedure: AV NODE ABLATION;  Surgeon: Evans Lance, MD;  Location: [facility];  Service: Cardiovascular;  Laterality: N/A;  . BACK SURGERY    . BREAST LUMPECTOMY Left   . CARDIOVERSION N/A 01/30/2017   Procedure: CARDIOVERSION;  Surgeon: Josue Hector, MD;  [facility];  Service: Cardiovascular;  Laterality: N/A;  .  CARDIOVERSION N/A 04/09/2017   Procedure: CARDIOVERSION;  Surgeon: Satira Sark, MD;  [facility];  Service: Cardiovascular;  Laterality: N/A;  . CATARACT EXTRACTION W/ INTRAOCULAR LENS  IMPLANT, BILATERAL Bilateral   . DILATION AND CURETTAGE OF UTERUS    . EUS N/A 10/03/2015   Procedure: UPPER ENDOSCOPIC ULTRASOUND (EUS) RADIAL;  Surgeon: Arta Silence, MD;  [facility];  Service: Endoscopy;  Laterality: N/A;  . FINE NEEDLE ASPIRATION N/A 10/03/2015   Procedure: FINE NEEDLE ASPIRATION (FNA) RADIAL;  Surgeon: Arta Silence, MD;  [facility];  Service: Endoscopy;  Laterality: N/A;  . INSERT / REPLACE / REMOVE PACEMAKER  05/18/2017  . LUMBAR LAMINECTOMY  1995; 06/23/2008   L4-5/notes 08/20/2010  . 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 NEPHRECTOMY TRANSPLANTED ORGAN    . PACEMAKER IMPLANT N/A 05/18/2017   Procedure: PACEMAKER IMPLANT;  Surgeon: Evans Lance, MD;  Location: [facility];  Service: Cardiovascular;  Laterality: N/A;  . TOTAL ABDOMINAL HYSTERECTOMY    . TUBAL LIGATION       Family History  Problem Relation Age of Onset  . Diabetes Other   . Hypertension Other   . Coronary artery disease Other   . Stroke Other   . Asthma Other   . Kidney disease Sister   . Cancer Sister        lung  . Lung cancer Sister   . Cancer Sister        lung  . Lung cancer Sister   . Cancer Sister        lung  . Suicidality Son        committed suicide in 2009  . CVA Son   . Hyperlipidemia Other   . Anxiety disorder Other   . Migraines Other   . Heart failure Father   . Healthy Brother   . Cancer Sister        lung  .  Healthy Sister      Social History   Socioeconomic History  . Marital status: Married    Spouse name: Mortimer Fries  . Number of children: 4  . Years of education: Not on file  . Highest education level: 9th grade  Occupational History  . Occupation: retired    Comment: cna  Tobacco Use  . Smoking status: Never Smoker  . Smokeless tobacco: Never Used  Vaping Use  . Vaping Use: Never used  Substance and Sexual Activity  . Alcohol use: No  . Drug use: No  . Sexual activity: Not Currently    Birth control/protection: Surgical  Other Topics Concern  . 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   Pt lives with her husband in a single wide trailer. They have 4 children and 13 grandchildren.    Social Determinants of Health   Financial Resource Strain: Not on file  Food Insecurity: Not on file  Transportation Needs: Not on file  Physical Activity: Not on file  Stress: Not on file  Social Connections: Not on file  Intimate Partner Violence: Not on file     Ht 5\' 7"  (1.702 m)   Wt 214 lb 9.6 oz (97.3 kg)   BMI 33.61 kg/m   Physical Exam:  Well appearing NAD HEENT: Unremarkable Neck:  No JVD, no thyromegally Lymphatics:  No adenopathy Back:  No CVA tenderness Lungs:  Clear  with scattered rales HEART:  Regular rate rhythm, no murmurs, no rubs, no clicks Abd:  soft, positive bowel sounds, no organomegally, no rebound, no guarding Ext:  2 plus pulses, no edema, no cyanosis, no clubbing Skin:  No rashes no nodules Neuro:  CN II through XII intact, motor grossly intact  DEVICE  Normal device function.  See PaceArt for details.   Assess/Plan: 1. Atrial fib with a CVR - her PPM demonstrates that she is now dependent. 2. 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 We turned her rate from 70 to 60 and turned down her rate response and her upper tracking from 130 to 110.  3. HTN  - her PR is well  controlled. We will follow. 4. Obesity - she continues to lose weight and I asked her to get down under 200 lbs.  Carleene Overlie Rollyn Scialdone,MD

## 2020-06-05 NOTE — Patient Instructions (Signed)
Medication Instructions:  Your physician recommends that you continue on your current medications as directed. Please refer to the Current Medication list given to you today.  *If you need a refill on your cardiac medications before your next appointment, please call your pharmacy*   Lab Work: NONE   If you have labs (blood work) drawn today and your tests are completely normal, you will receive your results only by: MyChart Message (if you have MyChart) OR A paper copy in the mail If you have any lab test that is abnormal or we need to change your treatment, we will call you to review the results.   Testing/Procedures: NONE    Follow-Up: At [facility], you and your health needs are our priority.  As part of our continuing mission to provide you with exceptional heart care, we have created designated Provider Care Teams.  These Care Teams include your primary Cardiologist (physician) and Advanced Practice Providers (APPs -  Physician Assistants and Nurse Practitioners) who all work together to provide you with the care you need, when you need it.  We recommend signing up for the patient portal called "MyChart".  Sign up information is provided on this After Visit Summary.  MyChart is used to connect with patients for Virtual Visits (Telemedicine).  Patients are able to view lab/test results, encounter notes, upcoming appointments, etc.  Non-urgent messages can be sent to your provider as well.   To learn more about what you can do with MyChart, go to [email or web address].    Your next appointment:   6 month(s)  The format for your next appointment:   In Person  Provider:   Gregg Taylor, MD   Other Instructions Thank you for choosing [facility]!    

## 2020-06-06 LAB — CUP PACEART REMOTE DEVICE CHECK
Battery Remaining Longevity: 76 mo
Battery Voltage: 2.88 V
Brady Statistic AP VP Percent: 0.66 %
Brady Statistic AP VS Percent: 99.3 %
Brady Statistic AS VP Percent: 0 %
Brady Statistic AS VS Percent: 0.04 %
Brady Statistic RA Percent Paced: 99.98 %
Brady Statistic RV Percent Paced: 0.66 %
Date Time Interrogation Session: 20220215161252
Implantable Lead Implant Date: 20190128
Implantable Lead Implant Date: 20190128
Implantable Lead Location: 753860
Implantable Lead Location: 753860
Implantable Lead Model: 3830
Implantable Lead Model: 5076
Implantable Pulse Generator Implant Date: 20190128
Lead Channel Impedance Value: 285 Ohm
Lead Channel Impedance Value: 361 Ohm
Lead Channel Impedance Value: 380 Ohm
Lead Channel Impedance Value: 475 Ohm
Lead Channel Sensing Intrinsic Amplitude: 10.25 mV
Lead Channel Sensing Intrinsic Amplitude: 10.5 mV
Lead Channel Sensing Intrinsic Amplitude: 3.625 mV
Lead Channel Sensing Intrinsic Amplitude: 3.625 mV
Lead Channel Setting Pacing Amplitude: 2.5 V
Lead Channel Setting Pacing Amplitude: 3.5 V
Lead Channel Setting Pacing Pulse Width: 0.4 ms
Lead Channel Setting Sensing Sensitivity: 0.9 mV

## 2020-06-07 ENCOUNTER — Other Ambulatory Visit: Payer: Self-pay | Admitting: Family

## 2020-06-07 MED ORDER — VITAMIN D (ERGOCALCIFEROL) 1.25 MG (50000 UNIT) PO CAPS: 50000.0000 [IU] | ORAL_CAPSULE | ORAL | 1 refills | Status: DC

## 2020-06-11 NOTE — Progress Notes (Signed)
Remote pacemaker transmission.   

## 2020-06-15 LAB — CUP PACEART INCLINIC DEVICE CHECK
Battery Remaining Longevity: 83 mo
Battery Voltage: 2.88 V
Brady Statistic AP VP Percent: 0.66 %
Brady Statistic AP VS Percent: 99.3 %
Brady Statistic AS VP Percent: 0 %
Brady Statistic AS VS Percent: 0.04 %
Brady Statistic RA Percent Paced: 99.98 %
Brady Statistic RV Percent Paced: 0.66 %
Date Time Interrogation Session: 20220215162700
Implantable Lead Implant Date: 20190128
Implantable Lead Implant Date: 20190128
Implantable Lead Location: 753860
Implantable Lead Location: 753860
Implantable Lead Model: 3830
Implantable Lead Model: 5076
Implantable Pulse Generator Implant Date: 20190128
Lead Channel Impedance Value: 285 Ohm
Lead Channel Impedance Value: 361 Ohm
Lead Channel Impedance Value: 380 Ohm
Lead Channel Impedance Value: 475 Ohm
Lead Channel Pacing Threshold Amplitude: 0.25 V
Lead Channel Pacing Threshold Amplitude: 2.5 V
Lead Channel Pacing Threshold Pulse Width: 0.4 ms
Lead Channel Pacing Threshold Pulse Width: 1 ms
Lead Channel Sensing Intrinsic Amplitude: 10.5 mV
Lead Channel Sensing Intrinsic Amplitude: 3.6 mV
Lead Channel Setting Pacing Amplitude: 2.5 V
Lead Channel Setting Pacing Amplitude: 3.5 V
Lead Channel Setting Pacing Pulse Width: 0.4 ms
Lead Channel Setting Sensing Sensitivity: 0.9 mV

## 2020-06-19 ENCOUNTER — Other Ambulatory Visit: Payer: Self-pay | Admitting: Family

## 2020-06-25 DIAGNOSIS — I509 Heart failure, unspecified: Secondary | ICD-10-CM | POA: Diagnosis not present

## 2020-06-25 DIAGNOSIS — J449 Chronic obstructive pulmonary disease, unspecified: Secondary | ICD-10-CM | POA: Diagnosis not present

## 2020-06-25 DIAGNOSIS — J8410 Pulmonary fibrosis, unspecified: Secondary | ICD-10-CM | POA: Diagnosis not present

## 2020-07-01 ENCOUNTER — Other Ambulatory Visit: Payer: Self-pay

## 2020-07-01 ENCOUNTER — Emergency Department (HOSPITAL_COMMUNITY)
Admission: EM | Admit: 2020-07-01 | Discharge: 2020-07-01 | Disposition: A | Discharge status: Home / Self Care | Payer: Medicare Other | Attending: Emergency Medicine | Admitting: Emergency Medicine

## 2020-07-01 ENCOUNTER — Emergency Department (HOSPITAL_COMMUNITY): Payer: Medicare Other

## 2020-07-01 ENCOUNTER — Encounter (HOSPITAL_COMMUNITY): Payer: Self-pay | Admitting: *Deleted

## 2020-07-01 DIAGNOSIS — M545 Low back pain, unspecified: Secondary | ICD-10-CM | POA: Insufficient documentation

## 2020-07-01 DIAGNOSIS — G8929 Other chronic pain: Secondary | ICD-10-CM | POA: Diagnosis not present

## 2020-07-01 DIAGNOSIS — M1611 Unilateral primary osteoarthritis, right hip: Secondary | ICD-10-CM | POA: Diagnosis not present

## 2020-07-01 DIAGNOSIS — Z743 Need for continuous supervision: Secondary | ICD-10-CM | POA: Diagnosis not present

## 2020-07-01 DIAGNOSIS — R0902 Hypoxemia: Secondary | ICD-10-CM | POA: Diagnosis not present

## 2020-07-01 DIAGNOSIS — E039 Hypothyroidism, unspecified: Secondary | ICD-10-CM | POA: Diagnosis not present

## 2020-07-01 DIAGNOSIS — I110 Hypertensive heart disease with heart failure: Secondary | ICD-10-CM | POA: Insufficient documentation

## 2020-07-01 DIAGNOSIS — Z950 Presence of cardiac pacemaker: Secondary | ICD-10-CM | POA: Insufficient documentation

## 2020-07-01 DIAGNOSIS — M549 Dorsalgia, unspecified: Secondary | ICD-10-CM | POA: Diagnosis not present

## 2020-07-01 DIAGNOSIS — Z7901 Long term (current) use of anticoagulants: Secondary | ICD-10-CM | POA: Diagnosis not present

## 2020-07-01 DIAGNOSIS — R6889 Other general symptoms and signs: Secondary | ICD-10-CM | POA: Diagnosis not present

## 2020-07-01 DIAGNOSIS — I5031 Acute diastolic (congestive) heart failure: Secondary | ICD-10-CM | POA: Insufficient documentation

## 2020-07-01 DIAGNOSIS — J449 Chronic obstructive pulmonary disease, unspecified: Secondary | ICD-10-CM | POA: Insufficient documentation

## 2020-07-01 DIAGNOSIS — Z79899 Other long term (current) drug therapy: Secondary | ICD-10-CM | POA: Insufficient documentation

## 2020-07-01 DIAGNOSIS — M5459 Other low back pain: Secondary | ICD-10-CM | POA: Diagnosis not present

## 2020-07-01 MED ORDER — METHOCARBAMOL 500 MG PO TABS: 500.0000 mg | ORAL_TABLET | Freq: Two times a day (BID) | ORAL | 0 refills | Status: DC

## 2020-07-01 MED ORDER — METHOCARBAMOL 1000 MG/10ML IJ SOLN
500.0000 mg | Freq: Once | INTRAMUSCULAR | Status: AC
  Administered 2020-07-01: 500 mg via INTRAMUSCULAR
  Filled 2020-07-01: qty 5
  Filled 2020-07-01: qty 10

## 2020-07-01 MED ORDER — FENTANYL CITRATE (PF) 100 MCG/2ML IJ SOLN
50.0000 ug | Freq: Once | INTRAMUSCULAR | Status: AC
  Administered 2020-07-01: 50 ug via INTRAMUSCULAR
  Filled 2020-07-01: qty 2

## 2020-07-01 NOTE — Discharge Instructions (Signed)
Please take the Robaxin as needed for muscle spasm or pain. Continue your other medications. The Robaxin may make you sleep or unsteady on your feet so be careful after taking it until you know how you will feel. Use your walker at all times to help you get around and seek help if needed for the care of your husband.

## 2020-07-01 NOTE — ED Provider Notes (Signed)
[facility] EMERGENCY DEPARTMENT Provider Note  CSN: [id number] Arrival date & time: 07/01/20 1814    History Chief Complaint  Patient presents with  . Back Pain    HPI  Lorraine Leonard is a 80 y.o. female with history of chronic back pain reports her R lower back/hip have been bothering her off and on for the last several months since she fell. Did not seek medical care then. She had been taking motrin and using voltaren gel with some relief. She reports the pain has been progressing over the last fww days and then this morning was unable to get up out of bed due to the pain. She has been helping care for her ill husband and may have 'overdone it'. She denies any bowel or bladder problems, pain does not radiate down her leg. No leg numbness or weakness.    Past Medical History:  Diagnosis Date  . Acute diastolic CHF (congestive heart failure) ([place]) 11/11/2016  . Allergic rhinitis   . Anxiety   . Anxiety   . Arthritis    "~ all my joints" (05/18/2017)  . Atrial fibrillation ([place]) 09/2016  . Atrial fibrillation with RVR ([place]) 05/18/2017  . Chronic lower back pain   . COPD (chronic obstructive pulmonary disease) ([place])   . Depression   . Diverticulosis   . Dyspnea   . Dysrhythmia   . GERD (gastroesophageal reflux disease)   . Glaucoma   . Gout   . Heart disease   . 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 Hyperlipidemia   . Hypertension   . Hypothyroidism   . Macular degeneration of both eyes   . Neuropathy   . On home oxygen therapy    "3L; 24/7" (05/18/2017)  . Pre-diabetes   . 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 Pulmonary fibrosis ([place])   . Pulmonary fibrosis ([place])   . Rheumatoid arthritis ([place])   . Sleep apnea    uses 3 liters of o2 24/7  . Urticaria     Past Surgical History:  Procedure Laterality Date  . AV NODE ABLATION  05/18/2017  . AV NODE ABLATION N/A 05/18/2017   Procedure: AV NODE ABLATION;   Surgeon: Evans Lance, MD;  Location: [place] [facility];  Service: Cardiovascular;  Laterality: N/A;  . BACK SURGERY    . BREAST LUMPECTOMY Left   . CARDIOVERSION N/A 01/30/2017   Procedure: CARDIOVERSION;  Surgeon: Josue Hector, MD;  [facility];  Service: Cardiovascular;  Laterality: N/A;  . CARDIOVERSION N/A 04/09/2017   Procedure: CARDIOVERSION;  Surgeon: Satira Sark, MD;  [facility];  Service: Cardiovascular;  Laterality: N/A;  . CATARACT EXTRACTION W/ INTRAOCULAR LENS  IMPLANT, BILATERAL Bilateral   . DILATION AND CURETTAGE OF UTERUS    . EUS N/A 10/03/2015   Procedure: UPPER ENDOSCOPIC ULTRASOUND (EUS) RADIAL;  Surgeon: Arta Silence, MD;  [facility];  Service: Endoscopy;  Laterality: N/A;  . FINE NEEDLE ASPIRATION N/A 10/03/2015   Procedure: FINE NEEDLE ASPIRATION (FNA) RADIAL;  Surgeon: Arta Silence, MD;  [facility];  Service: Endoscopy;  Laterality: N/A;  . INSERT / REPLACE / REMOVE PACEMAKER  05/18/2017  . LUMBAR LAMINECTOMY  1995; 06/23/2008   L4-5/notes 08/20/2010  . [place]   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  . NEPHRECTOMY TRANSPLANTED ORGAN    . PACEMAKER IMPLANT N/A 05/18/2017   Procedure:  PACEMAKER IMPLANT;  Surgeon: Evans Lance, MD;  Location: [facility];  Service: Cardiovascular;  Laterality: N/A;  . TOTAL ABDOMINAL HYSTERECTOMY    . TUBAL LIGATION      Family History  Problem Relation Age of Onset  . Diabetes Other   . Hypertension Other   . Coronary artery disease Other   . Stroke Other   . Asthma Other   . Kidney disease Sister   . Cancer Sister        lung  . Lung cancer Sister   . Cancer Sister        lung  . Lung cancer Sister   . Cancer Sister        lung  . Suicidality Son        committed suicide in 2009  . CVA Son   . Hyperlipidemia Other   . Anxiety disorder Other   . Migraines Other   . Heart failure Father   . Healthy Brother   . Cancer Sister         lung  . Healthy Sister     Social History   Tobacco Use  . Smoking status: Never Smoker  . Smokeless tobacco: Never Used  Vaping Use  . Vaping Use: Never used  Substance Use Topics  . Alcohol use: No  . Drug use: No     Home Medications Prior to Admission medications   Medication Sig Start Date End Date Taking? Authorizing Provider  acetaminophen (TYLENOL) 500 MG tablet Take 500 mg by mouth every 6 (six) hours as needed (pain).   Yes [provider]  albuterol (PROVENTIL) (2.5 MG/3ML) 0.083% nebulizer solution Take 3 mLs (2.5 mg total) by nebulization every 6 (six) hours as needed for wheezing or shortness of breath. J96.01 02/20/17  Yes Hawks, Christy A, FNP  albuterol (VENTOLIN HFA) 108 (90 Base) MCG/ACT inhaler TAKE 2 PUFFS BY MOUTH EVERY 6 HOURS AS NEEDED FOR WHEEZE OR SHORTNESS OF BREATH 02/24/20  Yes Hawks, Christy A, FNP  calcium citrate (CALCITRATE - DOSED IN MG ELEMENTAL CALCIUM) 950 MG tablet Take 1 tablet by mouth 2 (two) times daily.    Yes [provider]  cyanocobalamin 1000 MCG tablet Take 1,000 mcg by mouth daily. Vitamin b12 every AM   Yes [provider]  ELIQUIS 5 MG TABS tablet TAKE 1 TABLET BY MOUTH TWICE A DAY 06/20/20  Yes Hawks, Christy A, FNP  estradiol (ESTRACE) 0.1 MG/GM vaginal cream PLACE 9.41 APPLICATORFUL VAGINALLY WEEKLY AS NEEDED AS DIRECTED 10/18/19  Yes Hawks, Christy A, FNP  fluticasone (FLONASE) 50 MCG/ACT nasal spray PLACE 1 SPRAY INTO BOTH NOSTRILS 2 (TWO) TIMES DAILY AS NEEDED FOR ALLERGIES OR RHINITIS. 01/25/20  Yes Hawks, Christy A, FNP  fluticasone furoate-vilanterol (BREO ELLIPTA) 100-25 MCG/INH AEPB Inhale 1 puff into the lungs every morning. 03/19/20  Yes Tanda Rockers, MD  levocetirizine (XYZAL) 5 MG tablet TAKE 1 TABLET BY MOUTH EVERY DAY IN THE EVENING Patient taking differently: Take 5 mg by mouth every evening. 03/08/20  Yes Hawks, Christy A, FNP  levothyroxine (SYNTHROID) 125 MCG tablet Take 1 tablet (125 mcg  total) by mouth daily. 02/24/20  Yes Hawks, Christy A, FNP  metFORMIN (GLUCOPHAGE-XR) 500 MG 24 hr tablet TAKE 1 TABLET BY MOUTH EVERY DAY WITH BREAKFAST Patient taking differently: Take 500 mg by mouth daily with breakfast. 02/24/20  Yes Hawks, Christy A, FNP  methocarbamol (ROBAXIN) 500 MG tablet Take 1 tablet (500 mg total) by mouth 2 (  two) times daily. 07/01/20  Yes Truddie Hidden, MD  Multiple Vitamins-Minerals (PRESERVISION AREDS 2) CAPS Take 1 capsule by mouth 2 (two) times daily.   Yes [provider]  Olopatadine HCl 0.2 % SOLN INSTILL 1 DROP INTO BOTH EYES EVERY DAY Patient taking differently: Place 1 drop into both eyes daily. 04/30/20  Yes Hawks, Christy A, FNP  omeprazole (PRILOSEC) 20 MG capsule TAKE 1 CAPSULE BY MOUTH EVERY DAY 02/24/20  Yes Hawks, Christy A, FNP  OXYGEN Inhale 3 L into the lungs continuous.   Yes [provider]  predniSONE (DELTASONE) 5 MG tablet TAKE 1 TABLET BY MOUTH EVERY DAY WITH BREAKFAST Patient taking differently: Take 5 mg by mouth daily with breakfast. 05/29/20  Yes Hawks, Christy A, FNP  rosuvastatin (CRESTOR) 10 MG tablet TAKE 1 TABLET BY MOUTH EVERYDAY AT BEDTIME Patient taking differently: Take 10 mg by mouth every evening. 03/07/20  Yes Hawks, Christy A, FNP  spironolactone (ALDACTONE) 25 MG tablet TAKE 1 TABLET BY MOUTH EVERY DAY Patient taking differently: Take 25 mg by mouth daily. 05/21/20  Yes Hawks, Christy A, FNP  vitamin C (ASCORBIC ACID) 500 MG tablet Take 1,000 mg by mouth daily. AM   Yes [provider]  Vitamin D, Ergocalciferol, (DRISDOL) 1.25 MG (50000 UNIT) CAPS capsule Take 1 capsule (50,000 Units total) by mouth every 7 (seven) days. 06/07/20  Yes Hawks, Christy A, FNP  vitamin E 400 UNIT capsule Take 400 Units by mouth daily. AM   Yes [provider]  atenolol (TENORMIN) 25 MG tablet Take 1 tablet (25 mg total) by mouth daily. 02/24/20 05/24/20  Evelina Dun A, FNP  EPINEPHrine 0.3 mg/0.3 mL IJ SOAJ  injection Inject 0.3 mg into the muscle as needed for anaphylaxis.    [provider]  famotidine (PEPCID) 20 MG tablet Take 1 tablet (20 mg total) by mouth 2 (two) times daily for 14 days. 02/24/20 03/19/20  Sharion Balloon, FNP  furosemide (LASIX) 40 MG tablet Take 1 tablet (40 mg total) by mouth 2 (two) times daily. 02/24/20   Evelina Dun A, FNP  ondansetron (ZOFRAN) 8 MG tablet TAKE 1 TABLET (8 MG TOTAL) BY MOUTH EVERY 8 (EIGHT) HOURS AS NEEDED FOR NAUSEA OR VOMITING. 03/28/20   Evelina Dun A, FNP  Polyethyl Glycol-Propyl Glycol (SYSTANE) 0.4-0.3 % GEL ophthalmic gel Place 1 application into both eyes at bedtime as needed (dry eyes).    [provider]     Allergies    Allopurinol and Mobic [meloxicam]   Review of Systems   Review of Systems A comprehensive review of systems was completed and negative except as noted in HPI.    Physical Exam BP (!) 178/80   Pulse 72   Temp 98.5 F (36.9 C) (Oral)   Resp 15   Ht 5\' 7"  (1.702 m)   Wt 97.1 kg   SpO2 98%   BMI 33.52 kg/m   Physical Exam Vitals and nursing note reviewed.  Constitutional:      Appearance: Normal appearance.  HENT:     Head: Normocephalic and atraumatic.     Nose: Nose normal.     Mouth/Throat:     Mouth: Mucous membranes are moist.  Eyes:     Extraocular Movements: Extraocular movements intact.     Conjunctiva/sclera: Conjunctivae normal.  Cardiovascular:     Rate and Rhythm: Normal rate.  Pulmonary:     Effort: Pulmonary effort is normal.     Breath sounds: Normal breath sounds.  Abdominal:     General: Abdomen is flat.     Palpations: Abdomen is soft.     Tenderness: There is no abdominal tenderness.  Musculoskeletal:        General: Tenderness (R lumbar paraspinal muscles and R buttock) present. No swelling. Normal range of motion.     Cervical back: Neck supple.  Skin:    General: Skin is warm and dry.  Neurological:     General: No focal deficit present.     Mental  Status: She is alert.     Sensory: No sensory deficit.     Motor: No weakness.  Psychiatric:        Mood and Affect: Mood normal.      ED Results / Procedures / Treatments   Labs (all labs ordered are listed, but only abnormal results are displayed) Labs Reviewed - No data to display  EKG None  Radiology DG Hip Unilat With Pelvis 2-3 Views Right  Result Date: 07/01/2020 CLINICAL DATA:  Lower right-sided back pain. EXAM: DG HIP (WITH OR WITHOUT PELVIS) 2-3V RIGHT COMPARISON:  November 12, 2017 FINDINGS: There is no evidence of acute hip fracture or dislocation. Degenerative changes seen involving both hips in the form of joint space narrowing and acetabular sclerosis. Radiopaque pedicle screws are seen within the lower lumbar spine. Soft tissue structures are unremarkable. IMPRESSION: Degenerative and postoperative changes, as described above, without an acute osseous abnormality. Electronically Signed   By: Virgina Norfolk M.D.   On: 07/01/2020 19:32    Procedures Procedures  Medications Ordered in the ED Medications  fentaNYL (SUBLIMAZE) injection 50 mcg (50 mcg Intramuscular Given 07/01/20 1929)  methocarbamol (ROBAXIN) injection 500 mg (500 mg Intramuscular Given 07/01/20 1928)     MDM Rules/Calculators/A&P MDM Patient with MSK hip pain, will check xray at patient's request given prior fall without subsequent imaging. Also give IM pain meds and muscle relaxers for comfort.  ED Course  I have reviewed the triage vital signs and the nursing notes.  Pertinent labs & imaging results that were available during my care of the patient were reviewed by me and considered in my medical decision making (see chart for details).  Clinical Course as of 07/01/20 2222  Sun Jul 01, 2020  1946 Xray neg for acute process.  [CS]  2110 Patient feeling better after meds, able to walk in the ED with minimal assistance. She has a walker at home. Will d/c with Rx for Robaxin, continue other meds  and follow up with PCP. Patient cautioned to be careful of sedation with the robaxin to avoid falls.  [CS]    Clinical Course User Index [CS] Truddie Hidden, MD    Final Clinical Impression(s) / ED Diagnoses Final diagnoses:  Chronic right-sided low back pain without sciatica    Rx / DC Orders ED Discharge Orders         Ordered    methocarbamol (ROBAXIN) 500 MG tablet  2 times daily        07/01/20 2112           Truddie Hidden, MD 07/01/20 2222

## 2020-07-01 NOTE — ED Triage Notes (Signed)
Pt brought in by [facility] with c/o lower right back pain that has been going on for a few weeks. This morning she was not able to get up out of bed and pain was radiating into right hip as well. Denies recent injury but reports she has been helping to take care of her husband at home and might have "overdone it" per EMS. BP 140/70 per EMS.

## 2020-07-01 NOTE — ED Notes (Signed)
Pt ambulated estimated 50 feet in room and hall.  Pt standby assistance, pt states she has a walker at home she will be using.

## 2020-07-02 ENCOUNTER — Telehealth: Payer: Self-pay

## 2020-07-02 NOTE — Telephone Encounter (Signed)
Called daughter back patient is schedule for next week will call us back if patient gets worse and will schedule with someone else.

## 2020-07-02 NOTE — Telephone Encounter (Signed)
Patient's daughter is calling Caryl Pina back about message put in for appt.

## 2020-07-02 NOTE — Telephone Encounter (Signed)
Left message to call back for appt only needs 15 min spot please schedule first available

## 2020-07-02 NOTE — Telephone Encounter (Signed)
Pts granddaughter Comptroller) called to schedule pt an ER Follow Up appt for lower back pain and also says she believes pt could have a UTI based on her behavior.  Please call patients daughter Herbert Spires) to schedule appt. [phone]

## 2020-07-07 ENCOUNTER — Observation Stay (HOSPITAL_COMMUNITY)
Admission: EM | Admit: 2020-07-07 | Discharge: 2020-07-09 | Disposition: A | Discharge status: Home / Self Care | Payer: Medicare Other | Attending: Family Medicine | Admitting: Family Medicine

## 2020-07-07 ENCOUNTER — Other Ambulatory Visit: Payer: Self-pay

## 2020-07-07 ENCOUNTER — Emergency Department (HOSPITAL_COMMUNITY): Payer: Medicare Other

## 2020-07-07 ENCOUNTER — Encounter (HOSPITAL_COMMUNITY): Payer: Self-pay

## 2020-07-07 DIAGNOSIS — M549 Dorsalgia, unspecified: Principal | ICD-10-CM | POA: Diagnosis present

## 2020-07-07 DIAGNOSIS — I700 Atherosclerosis of aorta: Secondary | ICD-10-CM | POA: Diagnosis present

## 2020-07-07 DIAGNOSIS — J449 Chronic obstructive pulmonary disease, unspecified: Secondary | ICD-10-CM | POA: Diagnosis present

## 2020-07-07 DIAGNOSIS — N1832 Chronic kidney disease, stage 3b: Secondary | ICD-10-CM | POA: Diagnosis not present

## 2020-07-07 DIAGNOSIS — J45909 Unspecified asthma, uncomplicated: Secondary | ICD-10-CM | POA: Insufficient documentation

## 2020-07-07 DIAGNOSIS — I10 Essential (primary) hypertension: Secondary | ICD-10-CM | POA: Diagnosis present

## 2020-07-07 DIAGNOSIS — K219 Gastro-esophageal reflux disease without esophagitis: Secondary | ICD-10-CM | POA: Diagnosis present

## 2020-07-07 DIAGNOSIS — Z9981 Dependence on supplemental oxygen: Secondary | ICD-10-CM | POA: Insufficient documentation

## 2020-07-07 DIAGNOSIS — J8410 Pulmonary fibrosis, unspecified: Secondary | ICD-10-CM | POA: Diagnosis present

## 2020-07-07 DIAGNOSIS — E039 Hypothyroidism, unspecified: Secondary | ICD-10-CM | POA: Diagnosis not present

## 2020-07-07 DIAGNOSIS — N183 Chronic kidney disease, stage 3 unspecified: Secondary | ICD-10-CM | POA: Diagnosis present

## 2020-07-07 DIAGNOSIS — Z981 Arthrodesis status: Secondary | ICD-10-CM | POA: Diagnosis not present

## 2020-07-07 DIAGNOSIS — E119 Type 2 diabetes mellitus without complications: Secondary | ICD-10-CM

## 2020-07-07 DIAGNOSIS — Z7984 Long term (current) use of oral hypoglycemic drugs: Secondary | ICD-10-CM | POA: Diagnosis not present

## 2020-07-07 DIAGNOSIS — E1159 Type 2 diabetes mellitus with other circulatory complications: Secondary | ICD-10-CM | POA: Diagnosis present

## 2020-07-07 DIAGNOSIS — M1611 Unilateral primary osteoarthritis, right hip: Secondary | ICD-10-CM | POA: Diagnosis not present

## 2020-07-07 DIAGNOSIS — I130 Hypertensive heart and chronic kidney disease with heart failure and stage 1 through stage 4 chronic kidney disease, or unspecified chronic kidney disease: Secondary | ICD-10-CM | POA: Diagnosis not present

## 2020-07-07 DIAGNOSIS — E1122 Type 2 diabetes mellitus with diabetic chronic kidney disease: Secondary | ICD-10-CM | POA: Insufficient documentation

## 2020-07-07 DIAGNOSIS — M79604 Pain in right leg: Secondary | ICD-10-CM | POA: Diagnosis not present

## 2020-07-07 DIAGNOSIS — R6889 Other general symptoms and signs: Secondary | ICD-10-CM | POA: Diagnosis not present

## 2020-07-07 DIAGNOSIS — M545 Low back pain, unspecified: Secondary | ICD-10-CM

## 2020-07-07 DIAGNOSIS — R0902 Hypoxemia: Secondary | ICD-10-CM | POA: Diagnosis not present

## 2020-07-07 DIAGNOSIS — E1169 Type 2 diabetes mellitus with other specified complication: Secondary | ICD-10-CM | POA: Diagnosis present

## 2020-07-07 DIAGNOSIS — I480 Paroxysmal atrial fibrillation: Secondary | ICD-10-CM | POA: Diagnosis present

## 2020-07-07 DIAGNOSIS — Z7901 Long term (current) use of anticoagulants: Secondary | ICD-10-CM | POA: Insufficient documentation

## 2020-07-07 DIAGNOSIS — Z20822 Contact with and (suspected) exposure to covid-19: Secondary | ICD-10-CM | POA: Insufficient documentation

## 2020-07-07 DIAGNOSIS — Z743 Need for continuous supervision: Secondary | ICD-10-CM | POA: Diagnosis not present

## 2020-07-07 DIAGNOSIS — Z79899 Other long term (current) drug therapy: Secondary | ICD-10-CM | POA: Insufficient documentation

## 2020-07-07 DIAGNOSIS — I152 Hypertension secondary to endocrine disorders: Secondary | ICD-10-CM | POA: Diagnosis present

## 2020-07-07 DIAGNOSIS — I5032 Chronic diastolic (congestive) heart failure: Secondary | ICD-10-CM | POA: Diagnosis not present

## 2020-07-07 DIAGNOSIS — R29898 Other symptoms and signs involving the musculoskeletal system: Secondary | ICD-10-CM | POA: Diagnosis not present

## 2020-07-07 DIAGNOSIS — Z950 Presence of cardiac pacemaker: Secondary | ICD-10-CM | POA: Insufficient documentation

## 2020-07-07 DIAGNOSIS — I708 Atherosclerosis of other arteries: Secondary | ICD-10-CM | POA: Diagnosis present

## 2020-07-07 LAB — BASIC METABOLIC PANEL
Anion gap: 11 (ref 5–15)
BUN: 14 mg/dL (ref 8–23)
CO2: 27 mmol/L (ref 22–32)
Calcium: 10.1 mg/dL (ref 8.9–10.3)
Chloride: 100 mmol/L (ref 98–111)
Creatinine, Ser: 1.27 mg/dL — ABNORMAL HIGH (ref 0.44–1.00)
GFR, Estimated: 43 mL/min — ABNORMAL LOW (ref >60)
Glucose, Bld: 120 mg/dL — ABNORMAL HIGH (ref 70–99)
Potassium: 4 mmol/L (ref 3.5–5.1)
Sodium: 138 mmol/L (ref 135–145)

## 2020-07-07 LAB — CBC WITH DIFFERENTIAL/PLATELET
Abs Immature Granulocytes: 0.04 10*3/uL (ref 0.00–0.07)
Basophils Absolute: 0.1 10*3/uL (ref 0.0–0.1)
Basophils Relative: 1 %
Eosinophils Absolute: 0.1 10*3/uL (ref 0.0–0.5)
Eosinophils Relative: 1 %
HCT: 40.7 % (ref 36.0–46.0)
Hemoglobin: 13 g/dL (ref 12.0–15.0)
Immature Granulocytes: 0 %
Lymphocytes Relative: 21 %
Lymphs Abs: 2.1 10*3/uL (ref 0.7–4.0)
MCH: 28 pg (ref 26.0–34.0)
MCHC: 31.9 g/dL (ref 30.0–36.0)
MCV: 87.5 fL (ref 80.0–100.0)
Monocytes Absolute: 1.1 10*3/uL — ABNORMAL HIGH (ref 0.1–1.0)
Monocytes Relative: 11 %
Neutro Abs: 6.5 10*3/uL (ref 1.7–7.7)
Neutrophils Relative %: 66 %
Platelets: 286 10*3/uL (ref 150–400)
RBC: 4.65 MIL/uL (ref 3.87–5.11)
RDW: 14 % (ref 11.5–15.5)
WBC: 9.9 10*3/uL (ref 4.0–10.5)
nRBC: 0 % (ref 0.0–0.2)

## 2020-07-07 MED ORDER — MORPHINE SULFATE (PF) 4 MG/ML IV SOLN
4.0000 mg | Freq: Once | INTRAVENOUS | Status: AC
  Administered 2020-07-07: 4 mg via INTRAVENOUS
  Filled 2020-07-07: qty 1

## 2020-07-07 MED ORDER — CYCLOBENZAPRINE HCL 10 MG PO TABS
5.0000 mg | ORAL_TABLET | Freq: Once | ORAL | Status: AC
  Administered 2020-07-07: 5 mg via ORAL
  Filled 2020-07-07: qty 1

## 2020-07-07 MED ORDER — FENTANYL CITRATE (PF) 100 MCG/2ML IJ SOLN
50.0000 ug | Freq: Once | INTRAMUSCULAR | Status: AC
  Administered 2020-07-07: 50 ug via INTRAVENOUS
  Filled 2020-07-07: qty 2

## 2020-07-07 MED ORDER — MORPHINE SULFATE (PF) 2 MG/ML IV SOLN
2.0000 mg | Freq: Once | INTRAVENOUS | Status: DC
  Filled 2020-07-07: qty 1

## 2020-07-07 NOTE — ED Triage Notes (Signed)
Pt coming from home via EMS, seen in ED on 3/13 for back pain. Today states she is unable to ambulate due to weak and heavy legs along with pain in right hip and leg. No recent falls or injuries.
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
## 2020-07-07 NOTE — ED Provider Notes (Signed)
[facility] EMERGENCY DEPARTMENT Provider Note   CSN: [id number] Arrival date & time: 07/07/20  1828     History Chief Complaint  Patient presents with  . Weakness    Lorraine Leonard is a 80 y.o. female.  HPI   Patient with significant medical history of CHF, A. fib, COPD, rheumatoid arthritis chronic lower back pain presents with chief complaint of right-sided back pain.  She endorses that she has been having pain for the last couple of months but over the last week pain has gotten worse.  She describes a sharp sensation in her lower back which then radiates down into her right leg along her thigh.  She denies paresthesias or weakness in her lower extremities, denies incontinence, urinary retention, difficult bowel movements.  She denies  recent trauma to the area, denies IV drug use, denies autoimmune diseases.  She has been taking over-the-counter pain medication as well as muscle relaxer without any real relief.  She was seen here in the emergency department on 03/13 for similar complaint, imaging was negative that time, was sent home for follow-up with PCP.  Patient endorses that since being discharged she has been unable to get a bed for last 2 days due to pain.  Patient denies  alleviating factors.  Patient denies headaches, fevers, chills, shortness of breath, chest pain, dumping, nausea, vomiting, diarrhea, worsening pedal edema.  Past Medical History:  Diagnosis Date  . Acute diastolic CHF (congestive heart failure) ([place]) 11/11/2016  . Allergic rhinitis   . Anxiety   . Anxiety   . Arthritis    "~ all my joints" (05/18/2017)  . Atrial fibrillation ([place]) 09/2016  . Atrial fibrillation with RVR ([place]) 05/18/2017  . Chronic lower back pain   . COPD (chronic obstructive pulmonary disease) ([place])   . Depression   . Diverticulosis   . Dyspnea   . Dysrhythmia   . GERD (gastroesophageal reflux disease)   . Glaucoma   . Gout   . Heart disease   . History of blood transfusion 1983    "when I gave a 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"  . Hyperlipidemia   . Hypertension   . Hypothyroidism   . Macular degeneration of both eyes   . Neuropathy   . On home oxygen therapy    "3L; 24/7" (05/18/2017)  . Pre-diabetes   . 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 Pulmonary fibrosis ([place])   . Pulmonary fibrosis ([place])   . Rheumatoid arthritis ([place])   . Sleep apnea    uses 3 liters of o2 24/7  . Urticaria     Patient Active Problem List   Diagnosis Date Noted  . Asthmatic bronchitis , chronic ([place]) 03/19/2020  . Idiopathic urticaria 10/19/2018  . Osteoarthritis 07/22/2018  . Chronic respiratory failure with hypoxia ([place]) 03/26/2017  . COPD (chronic obstructive pulmonary disease) ([place]) 03/26/2017  . Atrial fibrillation ([place]) 02/09/2017  . Paroxysmal atrial fibrillation ([place]) 12/17/2016  . Black stool 12/17/2016  . Acute diastolic CHF (congestive heart failure) ([place]) 11/11/2016  . CKD (chronic kidney disease) stage 2, GFR 60-89 ml/min 11/11/2016  . Pulmonary fibrosis ([place]) 09/23/2016  . Diabetes mellitus ([place]) 07/17/2016  . Morbid obesity ([place]) 04/18/2016  . Snoring 11/09/2015  . Pulmonary hypertension ([place]) 11/09/2015  . Gout 10/19/2015  . Chronic urticaria 03/24/2015  . Metabolic syndrome 40/98/1191  . Single kidney 03/01/2015  . Macular degeneration of both eyes 11/28/2014  . Constipation 11/28/2014  . Chronic rhinitis 11/17/2014  . Hypertensive retinopathy  10/30/2014  . Vitamin D deficiency 08/17/2014  . Arthritis of knee 08/17/2014  . Hyperlipidemia associated with type 2 diabetes mellitus ([place]) 08/17/2014  . Depression   . Hypothyroidism   . Anxiety   . Cardiomegaly 05/05/2011  . Hypertension associated with diabetes ([place]) 02/08/2007  . 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 GASTROESOPHAGEAL REFLUX DISEASE 02/08/2007    Past Surgical History:  Procedure Laterality Date  . AV NODE ABLATION  05/18/2017  . AV NODE ABLATION N/A 05/18/2017   Procedure: AV NODE  ABLATION;  Surgeon: Evans Lance, MD;  Location: [facility];  Service: Cardiovascular;  Laterality: N/A;  . BACK SURGERY    . BREAST LUMPECTOMY Left   . CARDIOVERSION N/A 01/30/2017   Procedure: CARDIOVERSION;  Surgeon: Josue Hector, MD;  Location[facility];  Service: Cardiovascular;  Laterality: N/A;  . CARDIOVERSION N/A 04/09/2017   Procedure: CARDIOVERSION;  Surgeon: Satira Sark, MD;  Location: [facility];  Service: Cardiovascular;  Laterality: N/A;  . CATARACT EXTRACTION W/ INTRAOCULAR LENS  IMPLANT, BILATERAL Bilateral   . DILATION AND CURETTAGE OF UTERUS    . EUS N/A 10/03/2015   Procedure: UPPER ENDOSCOPIC ULTRASOUND (EUS) RADIAL;  Surgeon: Arta Silence, MD;  Location: [facility];  Service: Endoscopy;  Laterality: N/A;  . FINE NEEDLE ASPIRATION N/A 10/03/2015   Procedure: FINE NEEDLE ASPIRATION (FNA) RADIAL;  Surgeon: Arta Silence, MD;  Location: [facility];  Service: Endoscopy;  Laterality: N/A;  . INSERT / REPLACE / REMOVE PACEMAKER  05/18/2017  . LUMBAR LAMINECTOMY  1995; 06/23/2008   L4-5/notes 08/20/2010  . [place]   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  . NEPHRECTOMY TRANSPLANTED ORGAN    . PACEMAKER IMPLANT N/A 05/18/2017   Procedure: PACEMAKER IMPLANT;  Surgeon: Evans Lance, MD;  Location: [facility];  Service: Cardiovascular;  Laterality: N/A;  . TOTAL ABDOMINAL HYSTERECTOMY    . TUBAL LIGATION       OB History    Gravida  4   Para  4   Term  4   Preterm      AB      Living        SAB      IAB      Ectopic      Multiple      Live Births              Family History  Problem Relation Age of Onset  . Diabetes Other   . Hypertension Other   . Coronary artery disease Other   . Stroke Other   . Asthma Other   . Kidney disease Sister   . Cancer Sister        lung  . Lung cancer Sister   . Cancer Sister        lung  . Lung cancer Sister   . Cancer Sister        lung  . Suicidality  Son        committed suicide in 2009  . CVA Son   . Hyperlipidemia Other   . Anxiety disorder Other   . Migraines Other   . Heart failure Father   . Healthy Brother   . Cancer Sister        lung  . Healthy Sister     Social History   Tobacco Use  . Smoking status: Never Smoker  . Smokeless tobacco: Never Used  Vaping Use  . Vaping Use:  Never used  Substance Use Topics  . Alcohol use: No  . Drug use: No    Home Medications Prior to Admission medications   Medication Sig Start Date End Date Taking? Authorizing Provider  acetaminophen (TYLENOL) 500 MG tablet Take 500 mg by mouth every 6 (six) hours as needed (pain).   Yes [provider]  albuterol (PROVENTIL) (2.5 MG/3ML) 0.083% nebulizer solution Take 3 mLs (2.5 mg total) by nebulization every 6 (six) hours as needed for wheezing or shortness of breath. J96.01 02/20/17  Yes Hawks, Christy A, FNP  albuterol (VENTOLIN HFA) 108 (90 Base) MCG/ACT inhaler TAKE 2 PUFFS BY MOUTH EVERY 6 HOURS AS NEEDED FOR WHEEZE OR SHORTNESS OF BREATH 02/24/20  Yes Hawks, Christy A, FNP  atenolol (TENORMIN) 25 MG tablet Take 1 tablet (25 mg total) by mouth daily. 02/24/20 05/24/20 Yes Hawks, Christy A, FNP  calcium citrate (CALCITRATE - DOSED IN MG ELEMENTAL CALCIUM) 950 MG tablet Take 1 tablet by mouth 2 (two) times daily.    Yes [provider]  cephALEXin (KEFLEX) 250 MG capsule Take 250 mg by mouth daily. 06/19/20  Yes [provider]  cyanocobalamin 1000 MCG tablet Take 1,000 mcg by mouth daily. Vitamin b12 every AM   Yes [provider]  diclofenac Sodium (VOLTAREN) 1 % GEL Apply 4 g topically 4 (four) times daily.   Yes [provider]  ELIQUIS 5 MG TABS tablet TAKE 1 TABLET BY MOUTH TWICE A DAY 06/20/20  Yes Hawks, Christy A, FNP  EPINEPHrine 0.3 mg/0.3 mL IJ SOAJ injection Inject 0.3 mg into the muscle as needed for anaphylaxis.   Yes [provider]  famotidine (PEPCID) 20 MG tablet Take 1 tablet  (20 mg total) by mouth 2 (two) times daily for 14 days. 02/24/20 03/19/20 Yes Hawks, Christy A, FNP  fluticasone (FLONASE) 50 MCG/ACT nasal spray PLACE 1 SPRAY INTO BOTH NOSTRILS 2 (TWO) TIMES DAILY AS NEEDED FOR ALLERGIES OR RHINITIS. 01/25/20  Yes Hawks, Christy A, FNP  furosemide (LASIX) 40 MG tablet Take 1 tablet (40 mg total) by mouth 2 (two) times daily. 02/24/20  Yes Hawks, Christy A, FNP  levocetirizine (XYZAL) 5 MG tablet TAKE 1 TABLET BY MOUTH EVERY DAY IN THE EVENING Patient taking differently: Take 5 mg by mouth every evening. 03/08/20  Yes Hawks, Christy A, FNP  levothyroxine (SYNTHROID) 125 MCG tablet Take 1 tablet (125 mcg total) by mouth daily. 02/24/20  Yes Hawks, Christy A, FNP  metFORMIN (GLUCOPHAGE-XR) 500 MG 24 hr tablet TAKE 1 TABLET BY MOUTH EVERY DAY WITH BREAKFAST Patient taking differently: Take 500 mg by mouth daily with breakfast. 02/24/20  Yes Hawks, Christy A, FNP  methocarbamol (ROBAXIN) 500 MG tablet Take 1 tablet (500 mg total) by mouth 2 (two) times daily. 07/01/20  Yes Truddie Hidden, MD  Multiple Vitamins-Minerals (PRESERVISION AREDS 2) CAPS Take 1 capsule by mouth 2 (two) times daily.   Yes [provider]  omeprazole (PRILOSEC) 20 MG capsule TAKE 1 CAPSULE BY MOUTH EVERY DAY 02/24/20  Yes Hawks, Christy A, FNP  ondansetron (ZOFRAN) 8 MG tablet TAKE 1 TABLET (8 MG TOTAL) BY MOUTH EVERY 8 (EIGHT) HOURS AS NEEDED FOR NAUSEA OR VOMITING. 03/28/20  Yes Hawks, Christy A, FNP  OXYGEN Inhale 3 L into the lungs every evening.   Yes [provider]  Polyethyl Glycol-Propyl Glycol (SYSTANE) 0.4-0.3 % GEL ophthalmic gel Place 1 application into both eyes at bedtime as needed (dry eyes).   Yes [provider]  predniSONE (DELTASONE) 5 MG tablet TAKE 1 TABLET BY MOUTH EVERY DAY WITH BREAKFAST Patient taking differently: Take 5 mg by mouth daily with breakfast. 05/29/20  Yes Hawks, Christy A, FNP  rosuvastatin (CRESTOR) 10 MG tablet TAKE 1 TABLET BY MOUTH  EVERYDAY AT BEDTIME Patient taking differently: Take 10 mg by mouth every evening. 03/07/20  Yes Hawks, Christy A, FNP  spironolactone (ALDACTONE) 25 MG tablet TAKE 1 TABLET BY MOUTH EVERY DAY Patient taking differently: Take 25 mg by mouth daily. 05/21/20  Yes Hawks, Christy A, FNP  vitamin C (ASCORBIC ACID) 500 MG tablet Take 1,000 mg by mouth daily. AM   Yes [provider]  vitamin E 400 UNIT capsule Take 400 Units by mouth daily. AM   Yes [provider]  estradiol (ESTRACE) 0.1 MG/GM vaginal cream PLACE 0.01 APPLICATORFUL VAGINALLY WEEKLY AS NEEDED AS DIRECTED Patient not taking: Reported on 07/07/2020 10/18/19   Evelina Dun A, FNP  fluticasone furoate-vilanterol (BREO ELLIPTA) 100-25 MCG/INH AEPB Inhale 1 puff into the lungs every morning. Patient not taking: Reported on 07/07/2020 03/19/20   Tanda Rockers, MD  Olopatadine HCl 0.2 % SOLN INSTILL 1 DROP INTO BOTH EYES EVERY DAY Patient not taking: No sig reported 04/30/20   Evelina Dun A, FNP  Vitamin D, Ergocalciferol, (DRISDOL) 1.25 MG (50000 UNIT) CAPS capsule Take 1 capsule (50,000 Units total) by mouth every 7 (seven) days. 06/07/20   Sharion Balloon, FNP    Allergies    Allopurinol and Mobic [meloxicam]  Review of Systems   Review of Systems  Constitutional: Negative for chills and fever.  HENT: Negative for congestion.   Respiratory: Negative for shortness of breath.   Cardiovascular: Negative for chest pain.  Gastrointestinal: Negative for abdominal pain, nausea and vomiting.  Genitourinary: Negative for enuresis, flank pain and frequency.  Musculoskeletal: Positive for back pain.  Skin: Negative for rash.  Neurological: Negative for dizziness and headaches.  Hematological: Does not bruise/bleed easily.    Physical Exam Updated Vital Signs BP (!) 127/59 (BP Location: Left Arm)   Pulse 60   Temp 98.6 F (37 C) (Oral)   Resp 16   Ht 5\' 7"  (1.702 m)   Wt 97.1 kg   SpO2 97%   BMI 33.53 kg/m    Physical Exam Vitals and nursing note reviewed.  Constitutional:      General: She is not in acute distress.    Appearance: She is not ill-appearing.  HENT:     Head: Normocephalic and atraumatic.     Nose: No congestion.  Eyes:     Conjunctiva/sclera: Conjunctivae normal.  Cardiovascular:     Rate and Rhythm: Normal rate and regular rhythm.     Pulses: Normal pulses.  Abdominal:     Palpations: Abdomen is soft.     Tenderness: There is no abdominal tenderness. There is no right CVA tenderness or left CVA tenderness.  Musculoskeletal:     Right lower leg: No edema.     Left lower leg: No edema.     Comments: Patient spine was palpated, it was nontender to palpation, no step-off or deformities present.  She has no be tender along her perispinal muscles, mostly in her iliac crest on the right side  She has positive straight leg raise on the right side.  Skin:    General: Skin is warm and dry.  Neurological:     Mental Status: She is alert.  Psychiatric:        Mood and  Affect: Mood normal.     ED Results / Procedures / Treatments   Labs (all labs ordered are listed, but only abnormal results are displayed) Labs Reviewed  CBC WITH DIFFERENTIAL/PLATELET - Abnormal; Notable for the following components:      Result Value   Monocytes Absolute 1.1 (*)    All other components within normal limits  BASIC METABOLIC PANEL - Abnormal; Notable for the following components:   Glucose, Bld 120 (*)    Creatinine, Ser 1.27 (*)    GFR, Estimated 43 (*)    All other components within normal limits  RESP PANEL BY RT-PCR (FLU A&B, COVID) [id number]  URINALYSIS, ROUTINE W REFLEX MICROSCOPIC    EKG None  Radiology CT Hip Right Wo Contrast  Result Date: 07/07/2020 CLINICAL DATA:  Right hip and leg pain. EXAM: CT OF THE RIGHT HIP WITHOUT CONTRAST TECHNIQUE: Multidetector CT imaging of the right hip was performed according to the standard protocol. Multiplanar CT image reconstructions  were also generated. COMPARISON:  None. FINDINGS: Postoperative changes from posterior fusion in the visualized lower lumbar spine. 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 No fracture, subluxation or dislocation. Aortoiliac atherosclerosis. Sigmoid diverticulosis. Small bowel decompressed. Prior hysterectomy. No pelvic masses or free fluid. 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 No acute bony abnormality. Aortoiliac atherosclerosis. Sigmoid diverticulosis. Electronically Signed   By: Rolm Baptise M.D.   On: 07/07/2020 22:45    Procedures Procedures   Medications Ordered in ED Medications  fentaNYL (SUBLIMAZE) injection 50 mcg (50 mcg Intravenous Given 07/07/20 2028)  cyclobenzaprine (FLEXERIL) tablet 5 mg (5 mg Oral Given 07/07/20 1955)  morphine 4 MG/ML injection 4 mg (4 mg Intravenous Given 07/07/20 2136)    ED Course  I have reviewed the triage vital signs and the nursing notes.  Pertinent labs & imaging results that were available during my care of the patient were reviewed by me and considered in my medical decision making (see chart for details).    MDM Rules/Calculators/A&P                         Initial impression-patient presents with worsening right-sided back pain.  She is alert, does not appear acute distress, vital signs reassuring.  Will obtain basic lab work, UA, order imaging of lower lumbar and hip for further evaluation  Work-up-CBC is unremarkable, BMP shows slight hyperglycemia 120, creatinine of 1.27 appears to be a baseline for patient.  CT hip is negative for acute findings.  Reassessment patient was reassessed after providing her with muscle laxer and fentanyl, she endorses that she continues to have pain, she was unable to ambulate as she states her pain was too great.  Will provide patient with 4 mg of morphine, and reassess.  Patient was reassessed after providing her with morphine, she continues to have pain in her right hip and  is unable to stand up or apply any weight to the area.  Recommends admission due to uncontrolled pain patient is amenable to this.  Consult due to uncontrolled pain in the lower back  will consult with hospitalist for admission.  Spoke with Dr. Olevia Bowens of the hospitalist team he states as long as CT lumbar spine does not show acute abnormalities requiring MRI he will admit the patient.  Rule out- I have low suspicion for spinal fracture or spinal cord abnormality as patient denies urinary incontinency, retention, difficulty with bowel movements, denies saddle paresthesias.  Spine was palpated there  is no step-off, crepitus or gross deformities felt, neurovascular fully intact in the lower extremities.  She does have noted decrease range of motion with flexion and extension at the right hip flexor but I suspect this is more secondary due to pain.  Imaging negative for fractures or dislocation.  Low suspicion for septic arthritis as patient denies IV drug use, skin exam was performed no erythematous, edema or warm joints noted.  Plan-due to uncontrolled lumbar pain patient be admitted to the hospital for further evaluation.  Anticipate she will need further pain management as well as PT evaluation.  Due to shift change patient handed off to Dr. Stark Jock, he was provided HPI, current work-up, likely disposition.  Pending normal CT lumbar patient will be admitted under Dr. Olevia Bowens for further evaluation.    Final Clinical Impression(s) / ED Diagnoses Final diagnoses:  Lumbar pain    Rx / DC Orders ED Discharge Orders    None       Aron Baba 07/07/20 2337    Milton Ferguson, MD 07/09/20 1031

## 2020-07-07 NOTE — ED Notes (Signed)
Lorraine Leonard pt's daughter called lives in [place] please call with update [phone]

## 2020-07-08 ENCOUNTER — Other Ambulatory Visit: Payer: Self-pay

## 2020-07-08 DIAGNOSIS — I5032 Chronic diastolic (congestive) heart failure: Secondary | ICD-10-CM | POA: Diagnosis present

## 2020-07-08 DIAGNOSIS — M549 Dorsalgia, unspecified: Secondary | ICD-10-CM | POA: Diagnosis present

## 2020-07-08 DIAGNOSIS — N183 Chronic kidney disease, stage 3 unspecified: Secondary | ICD-10-CM | POA: Diagnosis present

## 2020-07-08 DIAGNOSIS — I700 Atherosclerosis of aorta: Secondary | ICD-10-CM | POA: Diagnosis present

## 2020-07-08 LAB — HEPATIC FUNCTION PANEL
ALT: 14 U/L (ref 0–44)
AST: 19 U/L (ref 15–41)
Albumin: 3.6 g/dL (ref 3.5–5.0)
Alkaline Phosphatase: 54 U/L (ref 38–126)
Bilirubin, Direct: 0.1 mg/dL (ref 0.0–0.2)
Indirect Bilirubin: 0.6 mg/dL (ref 0.3–0.9)
Total Bilirubin: 0.7 mg/dL (ref 0.3–1.2)
Total Protein: 7 g/dL (ref 6.5–8.1)

## 2020-07-08 LAB — RESP PANEL BY RT-PCR (FLU A&B, COVID) ARPGX2
Influenza A by PCR: NEGATIVE
Influenza B by PCR: NEGATIVE
SARS Coronavirus 2 by RT PCR: NEGATIVE

## 2020-07-08 LAB — HEMOGLOBIN A1C
Hgb A1c MFr Bld: 6.4 % — ABNORMAL HIGH (ref 4.8–5.6)
Mean Plasma Glucose: 136.98 mg/dL

## 2020-07-08 LAB — GLUCOSE, CAPILLARY
Glucose-Capillary: 94 mg/dL (ref 70–99)
Glucose-Capillary: 161 mg/dL — ABNORMAL HIGH (ref 70–99)
Glucose-Capillary: 190 mg/dL — ABNORMAL HIGH (ref 70–99)
Glucose-Capillary: 175 mg/dL — ABNORMAL HIGH (ref 70–99)

## 2020-07-08 MED ORDER — ALBUTEROL SULFATE (2.5 MG/3ML) 0.083% IN NEBU: 2.5000 mg | INHALATION_SOLUTION | Freq: Four times a day (QID) | RESPIRATORY_TRACT | Status: DC | PRN

## 2020-07-08 MED ORDER — SPIRONOLACTONE 25 MG PO TABS
25.0000 mg | ORAL_TABLET | Freq: Every day | ORAL | Status: DC
  Administered 2020-07-08 – 2020-07-09 (×2): 25 mg via ORAL
  Filled 2020-07-08 (×2): qty 1

## 2020-07-08 MED ORDER — INSULIN ASPART 100 UNIT/ML ~~LOC~~ SOLN
0.0000 [IU] | Freq: Three times a day (TID) | SUBCUTANEOUS | Status: DC
  Administered 2020-07-08: 5 [IU] via SUBCUTANEOUS
  Administered 2020-07-08: 3 [IU] via SUBCUTANEOUS
  Administered 2020-07-09: 2 [IU] via SUBCUTANEOUS

## 2020-07-08 MED ORDER — LEVOCETIRIZINE DIHYDROCHLORIDE 5 MG PO TABS: 5.0000 mg | ORAL_TABLET | Freq: Every evening | ORAL | Status: DC

## 2020-07-08 MED ORDER — ENOXAPARIN SODIUM 60 MG/0.6ML ~~LOC~~ SOLN: 50.0000 mg | SUBCUTANEOUS | Status: DC

## 2020-07-08 MED ORDER — LORATADINE 10 MG PO TABS
10.0000 mg | ORAL_TABLET | Freq: Every evening | ORAL | Status: DC
  Administered 2020-07-08: 10 mg via ORAL
  Filled 2020-07-08: qty 1

## 2020-07-08 MED ORDER — ASCORBIC ACID 500 MG PO TABS
1000.0000 mg | ORAL_TABLET | Freq: Every day | ORAL | Status: DC
  Administered 2020-07-08 – 2020-07-09 (×2): 1000 mg via ORAL
  Filled 2020-07-08 (×2): qty 2

## 2020-07-08 MED ORDER — PANTOPRAZOLE SODIUM 40 MG PO TBEC
40.0000 mg | DELAYED_RELEASE_TABLET | Freq: Every day | ORAL | Status: DC
  Administered 2020-07-08 – 2020-07-09 (×2): 40 mg via ORAL
  Filled 2020-07-08 (×2): qty 1

## 2020-07-08 MED ORDER — FAMOTIDINE 20 MG PO TABS
20.0000 mg | ORAL_TABLET | Freq: Two times a day (BID) | ORAL | Status: DC
  Administered 2020-07-08 – 2020-07-09 (×3): 20 mg via ORAL
  Filled 2020-07-08 (×3): qty 1

## 2020-07-08 MED ORDER — CALCIUM CITRATE 950 (200 CA) MG PO TABS
1.0000 | ORAL_TABLET | Freq: Two times a day (BID) | ORAL | Status: DC
  Administered 2020-07-08 – 2020-07-09 (×3): 200 mg via ORAL
  Filled 2020-07-08 (×7): qty 1

## 2020-07-08 MED ORDER — FUROSEMIDE 40 MG PO TABS
40.0000 mg | ORAL_TABLET | Freq: Two times a day (BID) | ORAL | Status: DC
  Administered 2020-07-08 – 2020-07-09 (×3): 40 mg via ORAL
  Filled 2020-07-08 (×3): qty 1

## 2020-07-08 MED ORDER — CEPHALEXIN 250 MG PO CAPS
250.0000 mg | ORAL_CAPSULE | Freq: Every day | ORAL | Status: DC
  Administered 2020-07-08 – 2020-07-09 (×2): 250 mg via ORAL
  Filled 2020-07-08 (×2): qty 1

## 2020-07-08 MED ORDER — VITAMIN B-12 1000 MCG PO TABS
1000.0000 ug | ORAL_TABLET | Freq: Every day | ORAL | Status: DC
  Administered 2020-07-08 – 2020-07-09 (×2): 1000 ug via ORAL
  Filled 2020-07-08 (×2): qty 1

## 2020-07-08 MED ORDER — ATENOLOL 25 MG PO TABS
25.0000 mg | ORAL_TABLET | Freq: Every day | ORAL | Status: DC
  Administered 2020-07-08 – 2020-07-09 (×2): 25 mg via ORAL
  Filled 2020-07-08 (×2): qty 1

## 2020-07-08 MED ORDER — ROSUVASTATIN CALCIUM 10 MG PO TABS
10.0000 mg | ORAL_TABLET | Freq: Every evening | ORAL | Status: DC
  Administered 2020-07-08: 10 mg via ORAL
  Filled 2020-07-08: qty 1

## 2020-07-08 MED ORDER — DICLOFENAC SODIUM 1 % EX GEL
4.0000 g | Freq: Four times a day (QID) | CUTANEOUS | Status: DC
  Administered 2020-07-08 – 2020-07-09 (×6): 4 g via TOPICAL
  Filled 2020-07-08: qty 100

## 2020-07-08 MED ORDER — POLYETHYL GLYCOL-PROPYL GLYCOL 0.4-0.3 % OP GEL
1.0000 "application " | Freq: Every evening | OPHTHALMIC | Status: DC | PRN
  Filled 2020-07-08: qty 10

## 2020-07-08 MED ORDER — ONDANSETRON HCL 4 MG PO TABS: 4.0000 mg | ORAL_TABLET | Freq: Four times a day (QID) | ORAL | Status: DC | PRN

## 2020-07-08 MED ORDER — LEVOTHYROXINE SODIUM 25 MCG PO TABS
125.0000 ug | ORAL_TABLET | Freq: Every day | ORAL | Status: DC
  Administered 2020-07-08 – 2020-07-09 (×2): 125 ug via ORAL
  Filled 2020-07-08 (×2): qty 1

## 2020-07-08 MED ORDER — FLUTICASONE PROPIONATE 50 MCG/ACT NA SUSP: 1.0000 | Freq: Two times a day (BID) | NASAL | Status: DC | PRN

## 2020-07-08 MED ORDER — ENSURE ENLIVE PO LIQD
237.0000 mL | Freq: Two times a day (BID) | ORAL | Status: DC
  Administered 2020-07-08 – 2020-07-09 (×4): 237 mL via ORAL

## 2020-07-08 MED ORDER — OXYCODONE HCL 5 MG PO TABS: 5.0000 mg | ORAL_TABLET | ORAL | Status: DC | PRN

## 2020-07-08 MED ORDER — ACETAMINOPHEN 650 MG RE SUPP: 650.0000 mg | Freq: Four times a day (QID) | RECTAL | Status: DC | PRN

## 2020-07-08 MED ORDER — PREDNISONE 5 MG PO TABS
5.0000 mg | ORAL_TABLET | Freq: Every day | ORAL | Status: DC
  Administered 2020-07-08 – 2020-07-09 (×2): 5 mg via ORAL
  Filled 2020-07-08 (×2): qty 1

## 2020-07-08 MED ORDER — HYDROMORPHONE HCL 1 MG/ML IJ SOLN
1.0000 mg | INTRAMUSCULAR | Status: DC | PRN
  Administered 2020-07-08: 1 mg via INTRAVENOUS
  Filled 2020-07-08: qty 1

## 2020-07-08 MED ORDER — ACETAMINOPHEN 325 MG PO TABS: 650.0000 mg | ORAL_TABLET | Freq: Four times a day (QID) | ORAL | Status: DC | PRN

## 2020-07-08 MED ORDER — METFORMIN HCL ER 500 MG PO TB24
500.0000 mg | ORAL_TABLET | Freq: Every day | ORAL | Status: DC
  Administered 2020-07-08 – 2020-07-09 (×2): 500 mg via ORAL
  Filled 2020-07-08 (×2): qty 1

## 2020-07-08 MED ORDER — APIXABAN 5 MG PO TABS
5.0000 mg | ORAL_TABLET | Freq: Two times a day (BID) | ORAL | Status: DC
  Administered 2020-07-08 – 2020-07-09 (×3): 5 mg via ORAL
  Filled 2020-07-08 (×3): qty 1

## 2020-07-08 MED ORDER — ONDANSETRON HCL 4 MG/2ML IJ SOLN: 4.0000 mg | Freq: Four times a day (QID) | INTRAMUSCULAR | Status: DC | PRN

## 2020-07-08 MED ORDER — METHOCARBAMOL 500 MG PO TABS: 750.0000 mg | ORAL_TABLET | Freq: Four times a day (QID) | ORAL | Status: DC | PRN

## 2020-07-08 NOTE — Progress Notes (Signed)
PROGRESS NOTE    Patient: Lorraine Leonard                            PCP: Sharion Balloon, FNP                    DOB: 1940-11-22            DOA: 07/07/2020 DTO:[id number]             DOS: 07/08/2020, 7:14 AM   LOS: 0 days   Date of Service: The patient was seen and examined on 07/08/2020  Subjective:   The patient was seen and examined this morning. Stable at this time. Still complaining of hip pain  Otherwise no issues overnight .  Brief Narrative:   JANEESE Leonard is a 80 y.o.f w PMh/o  chronic diastolic CHF, allergic rhinitis, anxiety, depression, paroxysmal atrial fibrillation, history of pacemaker placement, COPD, diverticulosis, GERD, glaucoma, bilateral macular degeneration, gout, hyperlipidemia, hypertension, hypothyroidism, type 2 diabetes, pulmonary fibrosis, rheumatoid arthritis, sleep apnea on nasal cannula oxygen at bedtime, history of back surgeries in her 75s and around age 52 who is coming to the emergency department for the second time in 6 days with complaints of exacerbation with progressive worsening of back pain for the last 3 days.    She feels some numbness of the the feet, particularly on the right side.  She stated that she has been unable to get out of bed due to pain and numbness sensation.  She denies fecal or urinary incontinence, but states is very painful to move around.    ED Course: Initial vital signs were temperature 98.6 F, pulse 66, respirations 20, BP 140/69 mmHg O2 sat 97% on room air.  Lab work: Her CBC was normal.  BMP showed a glu. of 120 and Cr. of 1.27 mg/dL, all other values were unremarkable.  Imaging:  CT right hip without contrast - showed mild degenerative changes of the hips bilaterally, but no acute bony abnormality.  There was aortoiliac atherosclerosis.   CT lumbar spine without contrast-  with no acute abnormality, but demonstrated right foraminal to extraforaminal disc protrusion at L3-4 level, contacting and potentially affecting  the exiting right L3 nerve root.  There is a right eccentric disc bulge and facet hypertrophy about the L2-3 level with resulting moderate to severe spinal stenosis, with moderate right worse than left L2 foraminal narrowing.  Posterior changes 45mg irs1 without residual spinal stenosis seen.  Please see images and full radiology report for further detail.   Assessment & Plan:   Principal Problem:   Intractable back pain Active Problems:   Hypertension associated with diabetes ([place])   GASTROESOPHAGEAL REFLUX DISEASE   Hypothyroidism   Hyperlipidemia associated with type 2 diabetes mellitus (HCC)   Type 2 diabetes mellitus (HCC)   Pulmonary fibrosis (HCC)   Paroxysmal atrial fibrillation (HCC)   COPD (chronic obstructive pulmonary disease) (HCC)   CKD (chronic kidney disease), stage 3b (HCC)   Aorto-iliac atherosclerosis (HCC)   Chronic diastolic heart failure (HCC)  Assessment/Plan    Intractable back pain -Continue to complain of low back pain, has not ambulated yet -We will consult PT OT for evaluation -We will continue pain management with analgesics -We will initiate muscle relaxants,  methocarbamol as needed. - Consult TOC team. - Unable to obtain LS-spine MRI due to pacemaker placement. If able to go home, should follow-up with back specialist.  Active Problems:    Hypertension associated with diabetes ([place]) -Remained stable initiating home medication Continue atenolol 25 mg p.o. twice daily. Continue furosemide 40 mg p.o. twice daily. Continue spironolactone 25 mg p.o. daily.    Paroxysmal atrial fibrillation (HCC) -Stable CHA?DS?-VASc Score of at least 7 Continue apixaban 5 mg p.o. daily. Continue atenolol 25 mg p.o. twice daily for rate control.    Chronic diastolic heart failure (HCC) No signs of decompensation, or exacerbation at this time Fluid and sodium restriction. Continue beta-blocker and diuretics.    GASTROESOPHAGEAL REFLUX  DISEASE Continue famotidine 20 mg p.o. twice daily. -Stable    Hypothyroidism -Stable, will continue e levothyroxine 125 mcg p.o. daily.    Hyperlipidemia associated with type 2 diabetes mellitus (HCC)   Aortoiliac atherosclerosis (HCC) -Stable Continue rosuvastatin 10 mg p.o. daily.    Type 2 diabetes mellitus (HCC) Carbohydrate modified diet. -Monitoring CBGs,    COPD (chronic obstructive pulmonary disease) (HCC)   Pulmonary fibrosis (HCC) -Stable no signs of exacerbation Continue daily physiological prednisone. Supplemental oxygen and bronchodilators as needed.    CKD (chronic kidney disease), stage 3b (HCC) Monitor renal function electrolytes. -Creatinine at baseline   DVT prophylaxis: On apixaban. Code Status:              Full code.   Disposition Plan:              Patient is from:                        Home.             Anticipated DC to:                   Home.             Anticipated DC date:               07/08/2020 or 07/09/2020.             Anticipated DC barriers:         Clinical status.           Consults called:        TOC team. Admission status:     Observation,/telemetry  Level of care: Telemetry        ----------------------------------------------------------------------------------------------------------------------------------------------- Nutritional status:  The patient's BMI is: Body mass index is 33.53 kg/m. I agree with the assessment and plan as outlined .Marland Kitchen   Family Communication: No family member present at bedside- attempt will be made to update daily The above findings and plan of care has been discussed with patient (and family)  in detail,  they expressed understanding and agreement of above. -Advance care planning has been discussed.      Procedures:   No admission procedures for hospital encounter.    Antimicrobials:  Anti-infectives (From admission, onward)   Start     Dose/Rate Route Frequency Ordered  Stop   07/08/20 1000  cephALEXin (KEFLEX) capsule 250 mg        250 mg Oral Daily 07/08/20 0341         Medication:  . apixaban  5 mg Oral BID  . vitamin C  1,000 mg Oral Daily  . atenolol  25 mg Oral Daily  . calcium citrate  1 tablet Oral BID  . cephALEXin  250 mg Oral Daily  . diclofenac Sodium  4 g Topical QID  . famotidine  20 mg Oral BID  . feeding  supplement  237 mL Oral BID BM  . furosemide  40 mg Oral BID  . insulin aspart  0-15 Units Subcutaneous TID WC  . levothyroxine  125 mcg Oral Daily  . loratadine  10 mg Oral QPM  . metFORMIN  500 mg Oral Q breakfast  . pantoprazole  40 mg Oral Daily  . predniSONE  5 mg Oral Q breakfast  . rosuvastatin  10 mg Oral QPM  . spironolactone  25 mg Oral Daily  . cyanocobalamin  1,000 mcg Oral Daily    acetaminophen **OR** acetaminophen, albuterol, fluticasone, HYDROmorphone (DILAUDID) injection, methocarbamol, ondansetron **OR** ondansetron (ZOFRAN) IV, oxyCODONE, Polyethyl Glycol-Propyl Glycol   Objective:   Vitals:   07/08/20 0100 07/08/20 0223 07/08/20 0401 07/08/20 0519  BP: (!) 137/47 137/62  (!) 142/61  Pulse: (!) 58 65 78 (!) 59  Resp:  20 16 18   Temp: 98.2 F (36.8 C) 97.7 F (36.5 C)  (!) 97.5 F (36.4 C)  TempSrc: Oral Oral  Oral  SpO2: 100% 98% 99% 100%  Weight:      Height:        Intake/Output Summary (Last 24 hours) at 07/08/2020 4696 Last data filed at 07/08/2020 0500 Gross per 24 hour  Intake --  Output 100 ml  Net -100 ml   Filed Weights   07/07/20 1837  Weight: 97.1 kg     Examination:   Physical Exam  Constitution:  Alert, cooperative, no distress,  Appears calm and comfortable  Psychiatric: Normal and stable mood and affect, cognition intact,   HEENT: Normocephalic, PERRL, otherwise with in Normal limits  Chest:Chest symmetric Cardio vascular:  S1/S2, RRR, No murmure, No Rubs or Gallops  pulmonary: Clear to auscultation bilaterally, respirations unlabored, negative wheezes /  crackles Abdomen: Soft, non-tender, non-distended, bowel sounds,no masses, no organomegaly Muscular skeletal:  Lumbar back pain exacerbates with exertion, range of motion at lumbar area limited Limited exam - in bed, able to move all 4 extremities, Normal strength,  Neuro: CNII-XII intact. , normal motor and sensation, reflexes intact  Extremities: No pitting edema lower extremities, +2 pulses  Skin: Dry, warm to touch, negative for any Rashes, No open wounds Wounds: per nursing documentation    ------------------------------------------------------------------------------------------------------------------------------------------    LABs:  CBC Latest Ref Rng & Units 07/07/2020 08/30/2019 05/03/2019  WBC 4.0 - 10.5 K/uL 9.9 11.0(H) 10.4  Hemoglobin 12.0 - 15.0 g/dL 13.0 10.6(L) 11.3  Hematocrit 36.0 - 46.0 % 40.7 34.6 35.3  Platelets 150 - 400 K/uL 286 294 270   CMP Latest Ref Rng & Units 07/08/2020 07/07/2020 02/24/2020  Glucose 70 - 99 mg/dL - 120(H) 140(H)  BUN 8 - 23 mg/dL - 14 20  Creatinine 0.44 - 1.00 mg/dL - 1.27(H) 1.34(H)  Sodium 135 - 145 mmol/L - 138 139  Potassium 3.5 - 5.1 mmol/L - 4.0 4.7  Chloride 98 - 111 mmol/L - 100 99  CO2 22 - 32 mmol/L - 27 27  Calcium 8.9 - 10.3 mg/dL - 10.1 10.1  Total Protein 6.5 - 8.1 g/dL 7.0 - 6.9  Total Bilirubin 0.3 - 1.2 mg/dL 0.7 - 0.4  Alkaline Phos 38 - 126 U/L 54 - 61  AST 15 - 41 U/L 19 - 23  ALT 0 - 44 U/L 14 - 15       Micro Results Recent Results (from the past 240 hour(s))  Resp Panel by RT-PCR (Flu A&B, Covid) Nasopharyngeal Swab     Status: None   Collection Time: 07/07/20 11:33 PM  Specimen: Nasopharyngeal Swab; Nasopharyngeal(NP) swabs in vial transport medium  Result Value Ref Range Status   SARS Coronavirus 2 by RT PCR NEGATIVE NEGATIVE Final    Comment: (NOTE) SARS-CoV-2 target nucleic acids are NOT DETECTED.  The SARS-CoV-2 RNA is generally detectable in upper respiratory specimens during the acute phase of  infection. The lowest concentration of SARS-CoV-2 viral copies this assay can detect is 138 copies/mL. A negative result does not preclude SARS-Cov-2 infection and should not be used as the sole basis for treatment or other patient management decisions. A negative result may occur with  improper specimen collection/handling, submission of specimen other than nasopharyngeal swab, presence of viral mutation(s) within the areas targeted by this assay, and inadequate number of viral copies(<138 copies/mL). A negative result must be combined with clinical observations, patient history, and epidemiological information. The expected result is Negative.  Fact Sheet for Patients:  EntrepreneurPulse.com.au  Fact Sheet for Healthcare Providers:  IncredibleEmployment.be  This test is no t yet approved or cleared by the [place] FDA and  has been authorized for detection and/or diagnosis of SARS-CoV-2 by FDA under an Emergency Use Authorization (EUA). This EUA will remain  in effect (meaning this test can be used) for the duration of the COVID-19 declaration under Section 564(b)(1) of the Act, 21 U.S.C.section 360bbb-3(b)(1), unless the authorization is terminated  or revoked sooner.       Influenza A by PCR NEGATIVE NEGATIVE Final   Influenza B by PCR NEGATIVE NEGATIVE Final    Comment: (NOTE) The Xpert Xpress SARS-CoV-2/FLU/RSV plus assay is intended as an aid in the diagnosis of influenza from Nasopharyngeal swab specimens and should not be used as a sole basis for treatment. Nasal washings and aspirates are unacceptable for Xpert Xpress SARS-CoV-2/FLU/RSV testing.  Fact Sheet for Patients: EntrepreneurPulse.com.au  Fact Sheet for Healthcare Providers: IncredibleEmployment.be  This test is not yet approved or cleared by the [place] FDA and has been authorized for detection and/or diagnosis of SARS-CoV-2  by FDA under an Emergency Use Authorization (EUA). This EUA will remain in effect (meaning this test can be used) for the duration of the COVID-19 declaration under Section 564(b)(1) of the Act, 21 U.S.C. section 360bbb-3(b)(1), unless the authorization is terminated or revoked.  Performed at [facility], [address] [place], [place] [address]     Radiology Reports CT Lumbar Spine Wo Contrast  Result Date: 07/07/2020 CLINICAL DATA:  Initial evaluation for low back pain, weakness and heaviness in legs, with pain in right hip and leg. EXAM: CT LUMBAR SPINE WITHOUT CONTRAST TECHNIQUE: Multidetector CT imaging of the lumbar spine was performed without intravenous contrast administration. Multiplanar CT image reconstructions were also generated. COMPARISON:  Previous MRI from 01/29/2008. FINDINGS: Segmentation: Standard. Lowest well-formed disc space labeled the L5-S1 level. Alignment: Mild dextroscoliosis with apex at L3-4. Alignment otherwise normal with preservation of the normal lumbar lordosis. Vertebrae: Sequelae of prior PLIF present at L4 through S1. Hardware intact without complication. Vertebral body height maintained without acute or chronic fracture. Visualized sacrum and pelvis intact. SI joints approximated symmetric. No discrete or worrisome osseous lesions. Paraspinal and other soft tissues: Mild paraspinous edema noted adjacent to the bulging L2-3 disc. Superimposed disc desiccation noted with no other features of acute discitis. Paraspinous soft tissues demonstrate no other acute finding. Normal expected chronic postoperative changes present within the lower posterior paraspinous soft tissues. Moderate aorto bi-iliac atherosclerotic disease. 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 Colonic diverticulosis noted. Disc levels: T12-L1: Mild disc bulge,  slightly eccentric to the left. Mild facet hypertrophy. No significant spinal stenosis. Foramina remain patent. L1-2: Mild disc bulge with  bilateral facet hypertrophy. No significant spinal stenosis. Foramina remain patent. L2-3: Diffuse disc bulge with disc desiccation. Associated reactive endplate change with marginal endplate osteophytic spurring. Disc bulging asymmetric to the right with prominent right foraminal to extraforaminal component (series 4, image 57). Moderate bilateral facet hypertrophy. Resultant moderate canal with bilateral lateral recess stenosis. Moderate right worse than left L2 foraminal narrowing. L3-4: Degenerative intervertebral disc space narrowing with diffuse disc bulge. Associated reactive endplate spurring. Superimposed right foraminal to extraforaminal disc protrusion contacts the exiting right L3 nerve root as it courses of the right neural foramen (series 4, image 72). Moderate bilateral facet hypertrophy. Resultant moderate to severe canal with bilateral lateral recess stenosis. Mild left with mild to moderate right L3 foraminal stenosis. L4-5: Prior PLIF. No residual spinal stenosis. Foramina appear patent. L5-S1: Prior posterior fusion with decompression. Degenerative intervertebral disc space narrowing with diffuse disc bulge and reactive endplate change. Broad-based left subarticular disc osteophyte complex encroaches upon the left lateral recess. Prior left hemi laminectomy. Right worse than left facet degeneration. No significant residual spinal stenosis. Residual mild to moderate left L5 foraminal narrowing. Right neural foramen appears patent. IMPRESSION: 1. No acute abnormality within the lumbar spine. 2. Right foraminal to extraforaminal disc protrusion at L3-4, contacting and potentially affecting the exiting right L3 nerve root. 3. Right eccentric disc bulge and facet hypertrophy at L2-3 with resultant moderate to severe spinal stenosis, with moderate right worse than left L2 foraminal narrowing. 4. Postoperative changes at L4-5 and L5-S1 without residual spinal stenosis. Residual mild to moderate left L5  foraminal narrowing. 5. Colonic diverticulosis without visible evidence for acute diverticulitis. 6. Aortic Atherosclerosis (ICD10-I70.0). Electronically Signed   By: Jeannine Boga M.D.   On: 07/07/2020 23:44   CT Hip Right Wo Contrast  Result Date: 07/07/2020 CLINICAL DATA:  Right hip and leg pain. EXAM: CT OF THE RIGHT HIP WITHOUT CONTRAST TECHNIQUE: Multidetector CT imaging of the right hip was performed according to the standard protocol. Multiplanar CT image reconstructions were also generated. COMPARISON:  None. FINDINGS: Postoperative changes from posterior fusion in the visualized lower lumbar spine. 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 No fracture, subluxation or dislocation. Aortoiliac atherosclerosis. Sigmoid diverticulosis. Small bowel decompressed. Prior hysterectomy. No pelvic masses or free fluid. 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 No acute bony abnormality. Aortoiliac atherosclerosis. Sigmoid diverticulosis. Electronically Signed   By: Rolm Baptise M.D.   On: 07/07/2020 22:45   DG Hip Unilat With Pelvis 2-3 Views Right  Result Date: 07/01/2020 CLINICAL DATA:  Lower right-sided back pain. EXAM: DG HIP (WITH OR WITHOUT PELVIS) 2-3V RIGHT COMPARISON:  November 12, 2017 FINDINGS: There is no evidence of acute hip fracture or dislocation. Degenerative changes seen involving both hips in the form of joint space narrowing and acetabular sclerosis. Radiopaque pedicle screws are seen within the lower lumbar spine. Soft tissue structures are unremarkable. IMPRESSION: Degenerative and postoperative changes, as described above, without an acute osseous abnormality. Electronically Signed   By: Virgina Norfolk M.D.   On: 07/01/2020 19:32    SIGNED: Deatra James, MD, FHM. [facility],  Pager (please use [email or web address] to page/text) Please use Epic Secure Chat for non-urgent communication (7AM-7PM)  If 7PM-7AM, please contact  night-coverage [email or web address], 07/08/2020, 7:14 AM

## 2020-07-08 NOTE — Care Management Obs Status (Signed)
[facility] NOTIFICATION   Patient Details  Name: Lorraine Leonard MRN: [id number] Date of Birth: 04/11/1941   Medicare Observation Status Notification Given:  Yes    Natasha Bence, LCSW 07/08/2020, 2:45 PM

## 2020-07-08 NOTE — H&P (Signed)
History and Physical    Lorraine Leonard DOB: 10-Mar-1941 DOA: 07/07/2020  PCP: Sharion Balloon, FNP   Patient coming from: Home.  I have personally briefly reviewed patient's old medical records in [place]  Chief Complaint: Back pain.  HPI: Lorraine Leonard is a 80 y.o. female with medical history significant of chronic diastolic CHF, allergic rhinitis, anxiety, depression, paroxysmal atrial fibrillation, history of pacemaker placement, COPD, diverticulosis, GERD, glaucoma, bilateral macular degeneration, gout, hyperlipidemia, hypertension, hypothyroidism, type 2 diabetes, pulmonary fibrosis, rheumatoid arthritis, sleep apnea on nasal cannula oxygen at bedtime, history of back surgeries in her 46s and around age 65 who is coming to the emergency department for the second time in 6 days with complaints of exacerbation with progressive worsening of back pain for the last 3 days.  She feels some numbness of the the feet, particularly on the right side.  She stated that she has been unable to get out of bed due to pain and numbness sensation.  She denies fecal or urinary incontinence, but states is very painful to move around.  She denies fever, headache, rhinorrhea, sore throat, wheezing or hemoptysis.  No chest pain, palpitations, diaphoresis, PND, orthopnea or recent lower extremity pitting edema.  Denies abdominal pain, nausea, vomiting, diarrhea, constipation, melena or hematochezia.  No dysuria, frequency or materia.  No polyuria, polydipsia, polyphagia or blurred vision.  ED Course: Initial vital signs were temperature 98.6 F, pulse 66, respirations 20, BP 140/69 mmHg O2 sat 97% on room air.  Lab work: Her CBC was normal.  BMP showed a glucose of 120 and creatinine of 1.27 mg/dL, all other values were unremarkable.  Imaging: CT right hip without contrast showed mild degenerative changes of the hips bilaterally, but no acute bony abnormality.  There was aortoiliac  atherosclerosis.  CT lumbar spine without contrast with no acute abnormality, but demonstrated right foraminal to extraforaminal disc protrusion at L3-4 level, contacting and potentially affecting the exiting right L3 nerve root.  There is a right eccentric disc bulge and facet hypertrophy about the L2-3 level with resulting moderate to severe spinal stenosis, with moderate right worse than left L2 foraminal narrowing.  Posterior changes 45mg irs1 without residual spinal stenosis seen.  Please see images and full radiology report for further detail.  Review of Systems: As per HPI otherwise all other systems reviewed and are negative.  Past Medical History:  Diagnosis Date  . Acute diastolic CHF (congestive heart failure) ([place]) 11/11/2016  . Allergic rhinitis   . Anxiety   . Anxiety   . Arthritis    "~ all my joints" (05/18/2017)  . Atrial fibrillation ([place]) 09/2016  . Atrial fibrillation with RVR ([place]) 05/18/2017  . Chronic lower back pain   . COPD (chronic obstructive pulmonary disease) ([place])   . Depression   . Diverticulosis   . Dyspnea   . Dysrhythmia   . GERD (gastroesophageal reflux disease)   . Glaucoma   . Gout   . Heart disease   . 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 Hyperlipidemia   . Hypertension   . Hypothyroidism   . Macular degeneration of both eyes   . Neuropathy   . On home oxygen therapy    "3L; 24/7" (05/18/2017)  . Pre-diabetes   . 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 Pulmonary fibrosis ([place])   . Pulmonary fibrosis ([place])   . Rheumatoid arthritis ([place])   .  Sleep apnea    uses 3 liters of o2 24/7  . Urticaria     Past Surgical History:  Procedure Laterality Date  . AV NODE ABLATION  05/18/2017  . AV NODE ABLATION N/A 05/18/2017   Procedure: AV NODE ABLATION;  Surgeon: Evans Lance, MD;  Location: [facility];  Service: Cardiovascular;  Laterality: N/A;  . BACK SURGERY    . BREAST LUMPECTOMY  Left   . CARDIOVERSION N/A 01/30/2017   Procedure: CARDIOVERSION;  Surgeon: Josue Hector, MD;  [facility];  Service: Cardiovascular;  Laterality: N/A;  . CARDIOVERSION N/A 04/09/2017   Procedure: CARDIOVERSION;  Surgeon: Satira Sark, MD;  [facility];  Service: Cardiovascular;  Laterality: N/A;  . CATARACT EXTRACTION W/ INTRAOCULAR LENS  IMPLANT, BILATERAL Bilateral   . DILATION AND CURETTAGE OF UTERUS    . EUS N/A 10/03/2015   Procedure: UPPER ENDOSCOPIC ULTRASOUND (EUS) RADIAL;  Surgeon: Arta Silence, MD;  Location: [facility];  Service: Endoscopy;  Laterality: N/A;  . FINE NEEDLE ASPIRATION N/A 10/03/2015   Procedure: FINE NEEDLE ASPIRATION (FNA) RADIAL;  Surgeon: Arta Silence, MD;  Location: [facility];  Service: Endoscopy;  Laterality: N/A;  . INSERT / REPLACE / REMOVE PACEMAKER  05/18/2017  . LUMBAR LAMINECTOMY  1995; 06/23/2008   L4-5/notes 08/20/2010  . [place]   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  . NEPHRECTOMY TRANSPLANTED ORGAN    . PACEMAKER IMPLANT N/A 05/18/2017   Procedure: PACEMAKER IMPLANT;  Surgeon: Evans Lance, MD;  Location: [facility];  Service: Cardiovascular;  Laterality: N/A;  . TOTAL ABDOMINAL HYSTERECTOMY    . TUBAL LIGATION     Social History  reports that she has never smoked. She has never used smokeless tobacco. She reports that she does not drink alcohol and does not use drugs.  Allergies  Allergen Reactions  . Allopurinol Swelling    Feet and legs swell  . Mobic [Meloxicam] Swelling and Other (See Comments)    Feet and Leg swelling   Family History  Problem Relation Age of Onset  . Diabetes Other   . Hypertension Other   . Coronary artery disease Other   . Stroke Other   . Asthma Other   . Kidney disease Sister   . Cancer Sister        lung  . Lung cancer Sister   . Cancer Sister        lung  . Lung cancer Sister   . Cancer Sister        lung  . Suicidality Son         committed suicide in 2009  . CVA Son   . Hyperlipidemia Other   . Anxiety disorder Other   . Migraines Other   . Heart failure Father   . Healthy Brother   . Cancer Sister        lung  . Healthy Sister    Prior to Admission medications   Medication Sig Start Date End Date Taking? Authorizing Provider  acetaminophen (TYLENOL) 500 MG tablet Take 500 mg by mouth every 6 (six) hours as needed (pain).   Yes [provider]  albuterol (PROVENTIL) (2.5 MG/3ML) 0.083% nebulizer solution Take 3 mLs (2.5 mg total) by nebulization every 6 (six) hours as needed for wheezing or shortness of breath. J96.01 02/20/17  Yes Hawks, Christy A, FNP  albuterol (VENTOLIN HFA) 108 (90 Base) MCG/ACT inhaler TAKE 2 PUFFS BY MOUTH  EVERY 6 HOURS AS NEEDED FOR WHEEZE OR SHORTNESS OF BREATH 02/24/20  Yes Hawks, Christy A, FNP  atenolol (TENORMIN) 25 MG tablet Take 1 tablet (25 mg total) by mouth daily. 02/24/20 05/24/20 Yes Hawks, Christy A, FNP  calcium citrate (CALCITRATE - DOSED IN MG ELEMENTAL CALCIUM) 950 MG tablet Take 1 tablet by mouth 2 (two) times daily.    Yes [provider]  cephALEXin (KEFLEX) 250 MG capsule Take 250 mg by mouth daily. 06/19/20  Yes [provider]  cyanocobalamin 1000 MCG tablet Take 1,000 mcg by mouth daily. Vitamin b12 every AM   Yes [provider]  diclofenac Sodium (VOLTAREN) 1 % GEL Apply 4 g topically 4 (four) times daily.   Yes [provider]  ELIQUIS 5 MG TABS tablet TAKE 1 TABLET BY MOUTH TWICE A DAY 06/20/20  Yes Hawks, Christy A, FNP  EPINEPHrine 0.3 mg/0.3 mL IJ SOAJ injection Inject 0.3 mg into the muscle as needed for anaphylaxis.   Yes [provider]  famotidine (PEPCID) 20 MG tablet Take 1 tablet (20 mg total) by mouth 2 (two) times daily for 14 days. 02/24/20 03/19/20 Yes Hawks, Christy A, FNP  fluticasone (FLONASE) 50 MCG/ACT nasal spray PLACE 1 SPRAY INTO BOTH NOSTRILS 2 (TWO) TIMES DAILY AS NEEDED FOR ALLERGIES OR RHINITIS.  01/25/20  Yes Hawks, Christy A, FNP  furosemide (LASIX) 40 MG tablet Take 1 tablet (40 mg total) by mouth 2 (two) times daily. 02/24/20  Yes Hawks, Christy A, FNP  levocetirizine (XYZAL) 5 MG tablet TAKE 1 TABLET BY MOUTH EVERY DAY IN THE EVENING Patient taking differently: Take 5 mg by mouth every evening. 03/08/20  Yes Hawks, Christy A, FNP  levothyroxine (SYNTHROID) 125 MCG tablet Take 1 tablet (125 mcg total) by mouth daily. 02/24/20  Yes Hawks, Christy A, FNP  metFORMIN (GLUCOPHAGE-XR) 500 MG 24 hr tablet TAKE 1 TABLET BY MOUTH EVERY DAY WITH BREAKFAST Patient taking differently: Take 500 mg by mouth daily with breakfast. 02/24/20  Yes Hawks, Christy A, FNP  methocarbamol (ROBAXIN) 500 MG tablet Take 1 tablet (500 mg total) by mouth 2 (two) times daily. 07/01/20  Yes Truddie Hidden, MD  Multiple Vitamins-Minerals (PRESERVISION AREDS 2) CAPS Take 1 capsule by mouth 2 (two) times daily.   Yes [provider]  omeprazole (PRILOSEC) 20 MG capsule TAKE 1 CAPSULE BY MOUTH EVERY DAY 02/24/20  Yes Hawks, Christy A, FNP  ondansetron (ZOFRAN) 8 MG tablet TAKE 1 TABLET (8 MG TOTAL) BY MOUTH EVERY 8 (EIGHT) HOURS AS NEEDED FOR NAUSEA OR VOMITING. 03/28/20  Yes Hawks, Christy A, FNP  OXYGEN Inhale 3 L into the lungs every evening.   Yes [provider]  Polyethyl Glycol-Propyl Glycol (SYSTANE) 0.4-0.3 % GEL ophthalmic gel Place 1 application into both eyes at bedtime as needed (dry eyes).   Yes [provider]  predniSONE (DELTASONE) 5 MG tablet TAKE 1 TABLET BY MOUTH EVERY DAY WITH BREAKFAST Patient taking differently: Take 5 mg by mouth daily with breakfast. 05/29/20  Yes Hawks, Christy A, FNP  rosuvastatin (CRESTOR) 10 MG tablet TAKE 1 TABLET BY MOUTH EVERYDAY AT BEDTIME Patient taking differently: Take 10 mg by mouth every evening. 03/07/20  Yes Hawks, Christy A, FNP  spironolactone (ALDACTONE) 25 MG tablet TAKE 1 TABLET BY MOUTH EVERY DAY Patient taking differently: Take 25 mg by  mouth daily. 05/21/20  Yes Hawks, Christy A, FNP  vitamin C (ASCORBIC ACID) 500 MG tablet Take 1,000 mg by mouth daily. AM  Yes [provider]  vitamin E 400 UNIT capsule Take 400 Units by mouth daily. AM   Yes [provider]  estradiol (ESTRACE) 0.1 MG/GM vaginal cream PLACE 5.09 APPLICATORFUL VAGINALLY WEEKLY AS NEEDED AS DIRECTED Patient not taking: Reported on 07/07/2020 10/18/19   Evelina Dun A, FNP  fluticasone furoate-vilanterol (BREO ELLIPTA) 100-25 MCG/INH AEPB Inhale 1 puff into the lungs every morning. Patient not taking: Reported on 07/07/2020 03/19/20   Tanda Rockers, MD  Olopatadine HCl 0.2 % SOLN INSTILL 1 DROP INTO BOTH EYES EVERY DAY Patient not taking: No sig reported 04/30/20   Evelina Dun A, FNP  Vitamin D, Ergocalciferol, (DRISDOL) 1.25 MG (50000 UNIT) CAPS capsule Take 1 capsule (50,000 Units total) by mouth every 7 (seven) days. 06/07/20   Sharion Balloon, FNP    Physical Exam: Vitals:   07/07/20 2315 07/08/20 0000 07/08/20 0100 07/08/20 0223  BP: (!) 127/59 (!) 124/59 (!) 137/47 137/62  Pulse: 60 (!) 59 (!) 58 65  Resp: 16 16  20   Temp:   98.2 F (36.8 C) 97.7 F (36.5 C)  TempSrc:   Oral Oral  SpO2: 97% 92% 100% 98%  Weight:      Height:       Constitutional: NAD, calm, comfortable Eyes: PERRL, lids and conjunctivae normal ENMT: Mucous membranes are moist. Posterior pharynx clear of any exudate or lesions. Neck: normal, supple, no masses, no thyromegaly Respiratory: Clear to auscultation bilaterally, no wheezing, no crackles. Normal respiratory effort. No accessory muscle use.  Cardiovascular: Regular rate and rhythm, no murmurs / rubs / gallops. No extremity edema. 2+ pedal pulses. No carotid bruits.  Abdomen: Obese, no distention.  BS positive.  Soft, no tenderness, no masses palpated. No hepatosplenomegaly. Musculoskeletal: Mild generalized weakness.  Decreased ROM of right hip joint with point tenderness in the area, bilateral  lumbar paraspinal muscle tenderness extending to the gluteal area, more intense on the right.  Normal muscle tone.  Skin: no rashes, lesions, ulcers on very limited dermatological examination. Neurologic: CN 2-12 grossly intact.  Decreased sensation on feet, but no significant different from right to left, DTR normal. Strength symmetrical bilaterally, but lower extremities are 4/5.  Psychiatric: Normal judgment and insight. Alert and oriented x 3. Normal mood.   Labs on Admission: I have personally reviewed following labs and imaging studies  CBC: Recent Labs  Lab 07/07/20 2024  WBC 9.9  NEUTROABS 6.5  HGB 13.0  HCT 40.7  MCV 87.5  PLT 326    Basic Metabolic Panel: Recent Labs  Lab 07/07/20 2024  NA 138  K 4.0  CL 100  CO2 27  GLUCOSE 120*  BUN 14  CREATININE 1.27*  CALCIUM 10.1    GFR: Estimated Creatinine Clearance: 43 mL/min (A) (by C-G formula based on SCr of 1.27 mg/dL (H)).  Liver Function Tests: No results for input(s): AST, ALT, ALKPHOS, BILITOT, PROT, ALBUMIN in the last 168 hours.  Radiological Exams on Admission: CT Lumbar Spine Wo Contrast  Result Date: 07/07/2020 CLINICAL DATA:  Initial evaluation for low back pain, weakness and heaviness in legs, with pain in right hip and leg. EXAM: CT LUMBAR SPINE WITHOUT CONTRAST TECHNIQUE: Multidetector CT imaging of the lumbar spine was performed without intravenous contrast administration. Multiplanar CT image reconstructions were also generated. COMPARISON:  Previous MRI from 01/29/2008. FINDINGS: Segmentation: Standard. Lowest well-formed disc space labeled the L5-S1 level. Alignment: Mild dextroscoliosis with apex at L3-4. Alignment otherwise normal with preservation of the normal lumbar lordosis. Vertebrae:  Sequelae of prior PLIF present at L4 through S1. Hardware intact without complication. Vertebral body height maintained without acute or chronic fracture. Visualized sacrum and pelvis intact. SI joints approximated  symmetric. No discrete or worrisome osseous lesions. Paraspinal and other soft tissues: Mild paraspinous edema noted adjacent to the bulging L2-3 disc. Superimposed disc desiccation noted with no other features of acute discitis. Paraspinous soft tissues demonstrate no other acute finding. Normal expected chronic postoperative changes present within the lower posterior paraspinous soft tissues. Moderate aorto bi-iliac atherosclerotic disease. 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 Colonic diverticulosis noted. Disc levels: T12-L1: Mild disc bulge, slightly eccentric to the left. Mild facet hypertrophy. No significant spinal stenosis. Foramina remain patent. L1-2: Mild disc bulge with bilateral facet hypertrophy. No significant spinal stenosis. Foramina remain patent. L2-3: Diffuse disc bulge with disc desiccation. Associated reactive endplate change with marginal endplate osteophytic spurring. Disc bulging asymmetric to the right with prominent right foraminal to extraforaminal component (series 4, image 57). Moderate bilateral facet hypertrophy. Resultant moderate canal with bilateral lateral recess stenosis. Moderate right worse than left L2 foraminal narrowing. L3-4: Degenerative intervertebral disc space narrowing with diffuse disc bulge. Associated reactive endplate spurring. Superimposed right foraminal to extraforaminal disc protrusion contacts the exiting right L3 nerve root as it courses of the right neural foramen (series 4, image 72). Moderate bilateral facet hypertrophy. Resultant moderate to severe canal with bilateral lateral recess stenosis. Mild left with mild to moderate right L3 foraminal stenosis. L4-5: Prior PLIF. No residual spinal stenosis. Foramina appear patent. L5-S1: Prior posterior fusion with decompression. Degenerative intervertebral disc space narrowing with diffuse disc bulge and reactive endplate change. Broad-based left subarticular disc osteophyte complex encroaches upon the left lateral  recess. Prior left hemi laminectomy. Right worse than left facet degeneration. No significant residual spinal stenosis. Residual mild to moderate left L5 foraminal narrowing. Right neural foramen appears patent. IMPRESSION: 1. No acute abnormality within the lumbar spine. 2. Right foraminal to extraforaminal disc protrusion at L3-4, contacting and potentially affecting the exiting right L3 nerve root. 3. Right eccentric disc bulge and facet hypertrophy at L2-3 with resultant moderate to severe spinal stenosis, with moderate right worse than left L2 foraminal narrowing. 4. Postoperative changes at L4-5 and L5-S1 without residual spinal stenosis. Residual mild to moderate left L5 foraminal narrowing. 5. Colonic diverticulosis without visible evidence for acute diverticulitis. 6. Aortic Atherosclerosis (ICD10-I70.0). Electronically Signed   By: Jeannine Boga M.D.   On: 07/07/2020 23:44   CT Hip Right Wo Contrast  Result Date: 07/07/2020 CLINICAL DATA:  Right hip and leg pain. EXAM: CT OF THE RIGHT HIP WITHOUT CONTRAST TECHNIQUE: Multidetector CT imaging of the right hip was performed according to the standard protocol. Multiplanar CT image reconstructions were also generated. COMPARISON:  None. FINDINGS: Postoperative changes from posterior fusion in the visualized lower lumbar spine. 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 No fracture, subluxation or dislocation. Aortoiliac atherosclerosis. Sigmoid diverticulosis. Small bowel decompressed. Prior hysterectomy. No pelvic masses or free fluid. 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 No acute bony abnormality. Aortoiliac atherosclerosis. Sigmoid diverticulosis. Electronically Signed   By: Rolm Baptise M.D.   On: 07/07/2020 22:45    EKG: Independently reviewed.   Assessment/Plan Principal Problem:   Intractable back pain Observation/telemetry. Continue analgesics as needed. Continue methocarbamol as needed. Consult  TOC team. Consider PT evaluation if no improvement. Unable to obtain LS-spine MRI due to pacemaker placement. If able to go home, should follow-up with back specialist.  Active Problems:  Hypertension associated with diabetes ([place]) Continue atenolol 25 mg p.o. twice daily. Continue furosemide 40 mg p.o. twice daily. Continue spironolactone 25 mg p.o. daily.    Paroxysmal atrial fibrillation (HCC) CHA?DS?-VASc Score of at least 7 Continue apixaban 5 mg p.o. daily. Continue atenolol 25 mg p.o. twice daily for rate control.    Chronic diastolic heart failure (HCC) No signs of decompensation. Fluid and sodium restriction. Continue beta-blocker and diuretics.    GASTROESOPHAGEAL REFLUX DISEASE Continue famotidine 20 mg p.o. twice daily.    Hypothyroidism Continue levothyroxine 125 mcg p.o. daily.    Hyperlipidemia associated with type 2 diabetes mellitus (HCC)   Aortoiliac atherosclerosis (HCC) Continue rosuvastatin 10 mg p.o. daily.    Type 2 diabetes mellitus (HCC) Carbohydrate modified diet.   COPD (chronic obstructive pulmonary disease) (HCC)   Pulmonary fibrosis (HCC) Continue daily physiological prednisone. Supplemental oxygen and bronchodilators as needed.    CKD (chronic kidney disease), stage 3b (HCC) Monitor renal function electrolytes.   DVT prophylaxis: On apixaban. Code Status:   Full code. Family Communication: Disposition Plan:   Patient is from:  Home.  Anticipated DC to:  Home.  Anticipated DC date:  07/08/2020 or 07/09/2020.  Anticipated DC barriers: Clinical status.  Consults called:  TOC team. Admission status:  Observation,/telemetry  Severity of Illness:  Reubin Milan MD [facility]  How to contact the [facility] Attending or Consulting provider [place] or covering provider during after hours [place], for this patient?   1. Check the care team in [facility] and look for a) attending/consulting [facility] provider listed and b) the [facility] team  listed 2. Log into [email or web address] and use [place] universal password to access. If you do not have the password, please contact the hospital operator. 3. Locate the [facility] provider you are looking for under [facility] and page to a number that you can be directly reached. 4. If you still have difficulty reaching the provider, please page the [facility] (Director on Call) for the Hospitalists listed on amion for assistance.  07/08/2020, 3:44 AM   This document was prepared using Dragon voice recognition software and may contain some unintended transcription errors.

## 2020-07-09 ENCOUNTER — Ambulatory Visit: Payer: Medicare Other | Admitting: Family

## 2020-07-09 ENCOUNTER — Telehealth: Payer: Self-pay

## 2020-07-09 DIAGNOSIS — M549 Dorsalgia, unspecified: Secondary | ICD-10-CM | POA: Diagnosis not present

## 2020-07-09 LAB — GLUCOSE, CAPILLARY
Glucose-Capillary: 101 mg/dL — ABNORMAL HIGH (ref 70–99)
Glucose-Capillary: 147 mg/dL — ABNORMAL HIGH (ref 70–99)

## 2020-07-09 MED ORDER — SENNOSIDES-DOCUSATE SODIUM 8.6-50 MG PO TABS
1.0000 | ORAL_TABLET | Freq: Two times a day (BID) | ORAL | Status: DC
  Administered 2020-07-09: 1 via ORAL
  Filled 2020-07-09: qty 1

## 2020-07-09 MED ORDER — ACETAMINOPHEN 325 MG PO TABS: 650.0000 mg | ORAL_TABLET | Freq: Four times a day (QID) | ORAL | 0 refills | Status: AC | PRN

## 2020-07-09 MED ORDER — ONDANSETRON HCL 8 MG PO TABS: 8.0000 mg | ORAL_TABLET | Freq: Three times a day (TID) | ORAL | 2 refills | Status: AC | PRN

## 2020-07-09 MED ORDER — OXYCODONE HCL 5 MG PO TABS: 5.0000 mg | ORAL_TABLET | Freq: Four times a day (QID) | ORAL | 0 refills | Status: AC | PRN

## 2020-07-09 MED ORDER — CIPROFLOXACIN HCL 500 MG PO TABS: 500.0000 mg | ORAL_TABLET | Freq: Two times a day (BID) | ORAL | 0 refills | Status: AC

## 2020-07-09 MED ORDER — METHOCARBAMOL 500 MG PO TABS: 500.0000 mg | ORAL_TABLET | Freq: Two times a day (BID) | ORAL | 1 refills | Status: AC

## 2020-07-09 NOTE — Discharge Summary (Signed)
Physician Discharge Summary [facility]    Patient: Lorraine Leonard                   Admit date: 07/07/2020   DOB: 09-13-1940             Discharge date:07/09/2020/10:41 AM YBO:[id number]                          PCP: Sharion Balloon, FNP  Disposition: HOME with Home Health   Recommendations for Outpatient Follow-up:   . Follow up: in 1 week with PCP  Discharge Condition: Stable   Code Status:   Code Status: Full Code  Diet recommendation: Regular healthy diet   Discharge Diagnoses:    Principal Problem:   Intractable back pain Active Problems:   Hypertension associated with diabetes ([place])   GASTROESOPHAGEAL REFLUX DISEASE   Hypothyroidism   Hyperlipidemia associated with type 2 diabetes mellitus (HCC)   Type 2 diabetes mellitus (HCC)   Pulmonary fibrosis (HCC)   Paroxysmal atrial fibrillation (HCC)   COPD (chronic obstructive pulmonary disease) (HCC)   CKD (chronic kidney disease), stage 3b ([place])   Aorto-iliac atherosclerosis ([place])   Chronic diastolic heart failure ([place])   History of Present Illness/ Hospital Course Lorraine Leonard Summary:   Lorraine Borgeson Atkinsis a 80 y.o.f w PMh/o chronic diastolic CHF, allergic rhinitis, anxiety, depression, paroxysmal atrial fibrillation, history of pacemaker placement, COPD, diverticulosis, GERD, glaucoma, bilateral macular degeneration, gout, hyperlipidemia, hypertension, hypothyroidism, type 2 diabetes, pulmonary fibrosis, rheumatoid arthritis, sleep apnea on nasal cannula oxygen at bedtime, history of back surgeries in her 23s and around age 61 who is coming to the emergency departmentfor the second time in 6 dayswith complaints of exacerbation with progressive worsening of back pain for the last 3 days.   She feels some numbness of the the feet, particularly on the rightside. She stated that she has been unable to get out of bed due to pain and numbness sensation. She denies fecal or urinary incontinence, but states is very  painful to move around.    ED Course:Initial vital signs were temperature98.6 F, pulse 66, respirations 20, BP 140/69 mmHg O2 sat 97% on room air.  Lab work:Her CBC was normal. BMP showed a glu. of 120 and Cr. of 1.27 mg/dL, all other values were unremarkable.  Imaging: CT right hip without contrast - showed mild degenerative changes of the hips bilaterally, but no acute bony abnormality. There was aortoiliac atherosclerosis.  CT lumbar spine without contrast-  with no acute abnormality, but demonstrated right foraminal to extraforaminal disc protrusion at L3-4 level, contacting and potentially affecting the exiting right L3 nerve root. There is a right eccentric disc bulge and facet hypertrophy about the L2-3 level with resulting moderate to severe spinal stenosis, with moderate right worse than left L2 foraminal narrowing. Posterior changes 45mg irs1 without residual spinal stenosis seen. Please see images and full radiology report for further detail.   Assessment & Plan:    Intractable back pain -Continue to complain of low back pain,  -Today was able to ambulate with assist, and participated with PT OT -consulted PT OT for evaluation... Recommended home health - analgesics-provided the patient was instructed to alternate ibuprofen, Tylenol, and if absolutely intolerable to pain -We will initiate muscle relaxants,  methocarbamol as needed. - Home health has been arranged - Unable to obtain LS-spine MRI due to pacemaker placement. If able to go home, should follow-up with back specialist.  Urine tract infection -Urine positive for leukocyte esterase, nitrates -Patient will be initiated on p.o. ciprofloxacin for 5 days   Active Problems:  Hypertension associated with diabetes ([place]) -Remained stable initiating home medication Continue atenolol 25 mg p.o. twice daily. Continue furosemide 40 mg p.o. twice daily. Continue spironolactone 25 mg p.o.  daily.  Paroxysmal atrial fibrillation (HCC) -Stable CHA?DS?-VASc Scoreof at least 7 Continue apixaban 5 mg p.o. daily. Continue atenolol 25 mg p.o. twice daily for rate control.  Chronic diastolic heart failure (HCC) No signs of decompensation, or exacerbation at this time Fluid and sodium restriction. Continue beta-blocker and diuretics.  GASTROESOPHAGEAL REFLUX DISEASE Continue famotidine 20 mg p.o. twice daily. -Stable  Hypothyroidism -Stable, will continue e levothyroxine 125 mcg p.o. daily.  Hyperlipidemia associated with type 2 diabetes mellitus (HCC) Aortoiliac atherosclerosis (HCC) -Stable Continue rosuvastatin 10 mg p.o. daily.  Type 2 diabetes mellitus (HCC) Carbohydrate modified diet. -Monitoring CBGs,  COPD (chronic obstructive pulmonary disease) (HCC) Pulmonary fibrosis (HCC) -Stable no signs of exacerbation Continue daily physiological prednisone. Supplemental oxygen and bronchodilators as needed.  CKD (chronic kidney disease), stage 3b (HCC) Monitor renal function electrolytes. -Creatinine at baseline     Disposition Plan: Patient is from:Home. Anticipated DC BC:WUGQ with Home health  Anticipated DC date: 07/09/2020.       Discharge Instructions:   Discharge Instructions    Activity as tolerated - No restrictions   Complete by: As directed    Diet - low sodium heart healthy   Complete by: As directed    Discharge instructions   Complete by: As directed    Fall precautions, aggressive PT OT   Increase activity slowly   Complete by: As directed        Medication List    STOP taking these medications   Breo Ellipta 100-25 MCG/INH Aepb Generic drug: fluticasone furoate-vilanterol   cephALEXin 250 MG capsule Commonly known as: KEFLEX   estradiol 0.1 MG/GM vaginal cream Commonly known as: ESTRACE    famotidine 20 MG tablet Commonly known as: PEPCID   vitamin E 180 MG (400 UNITS) capsule     TAKE these medications   acetaminophen 500 MG tablet Commonly known as: TYLENOL Take 500 mg by mouth every 6 (six) hours as needed (pain). What changed: Another medication with the same name was added. Make sure you understand how and when to take each.   acetaminophen 325 MG tablet Commonly known as: TYLENOL Take 2 tablets (650 mg total) by mouth every 6 (six) hours as needed for mild pain (or Fever >/= 101). What changed: You were already taking a medication with the same name, and this prescription was added. Make sure you understand how and when to take each.   albuterol (2.5 MG/3ML) 0.083% nebulizer solution Commonly known as: PROVENTIL Take 3 mLs (2.5 mg total) by nebulization every 6 (six) hours as needed for wheezing or shortness of breath. J96.01   albuterol 108 (90 Base) MCG/ACT inhaler Commonly known as: VENTOLIN HFA TAKE 2 PUFFS BY MOUTH EVERY 6 HOURS AS NEEDED FOR WHEEZE OR SHORTNESS OF BREATH   atenolol 25 MG tablet Commonly known as: TENORMIN Take 1 tablet (25 mg total) by mouth daily.   calcium citrate 950 (200 Ca) MG tablet Commonly known as: CALCITRATE - dosed in mg elemental calcium Take 1 tablet by mouth 2 (two) times daily.   ciprofloxacin 500 MG tablet Commonly known as: Cipro Take 1 tablet (500 mg total) by mouth 2 (two) times daily for 5 days.  cyanocobalamin 1000 MCG tablet Take 1,000 mcg by mouth daily. Vitamin b12 every AM   Eliquis 5 MG Tabs tablet Generic drug: apixaban TAKE 1 TABLET BY MOUTH TWICE A DAY   EPINEPHrine 0.3 mg/0.3 mL Soaj injection Commonly known as: EPI-PEN Inject 0.3 mg into the muscle as needed for anaphylaxis.   fluticasone 50 MCG/ACT nasal spray Commonly known as: FLONASE PLACE 1 SPRAY INTO BOTH NOSTRILS 2 (TWO) TIMES DAILY AS NEEDED FOR ALLERGIES OR RHINITIS.   furosemide 40 MG tablet Commonly known as: Lasix Take 1  tablet (40 mg total) by mouth 2 (two) times daily.   levocetirizine 5 MG tablet Commonly known as: XYZAL TAKE 1 TABLET BY MOUTH EVERY DAY IN THE EVENING What changed:   how much to take  how to take this   levothyroxine 125 MCG tablet Commonly known as: SYNTHROID Take 1 tablet (125 mcg total) by mouth daily.   metFORMIN 500 MG 24 hr tablet Commonly known as: GLUCOPHAGE-XR TAKE 1 TABLET BY MOUTH EVERY DAY WITH BREAKFAST What changed:   how much to take  how to take this  when to take this  additional instructions   methocarbamol 500 MG tablet Commonly known as: ROBAXIN Take 1 tablet (500 mg total) by mouth 2 (two) times daily for 20 days.   Olopatadine HCl 0.2 % Soln INSTILL 1 DROP INTO BOTH EYES EVERY DAY   omeprazole 20 MG capsule Commonly known as: PRILOSEC TAKE 1 CAPSULE BY MOUTH EVERY DAY   ondansetron 8 MG tablet Commonly known as: ZOFRAN Take 1 tablet (8 mg total) by mouth every 8 (eight) hours as needed for up to 20 days for nausea or vomiting.   oxyCODONE 5 MG immediate release tablet Commonly known as: Oxy IR/ROXICODONE Take 1 tablet (5 mg total) by mouth every 6 (six) hours as needed for up to 3 days for moderate pain, severe pain or breakthrough pain.   OXYGEN Inhale 3 L into the lungs every evening.   Polyethyl Glycol-Propyl Glycol 0.4-0.3 % Gel ophthalmic gel Commonly known as: SYSTANE Place 1 application into both eyes at bedtime as needed (dry eyes).   predniSONE 5 MG tablet Commonly known as: DELTASONE TAKE 1 TABLET BY MOUTH EVERY DAY WITH BREAKFAST What changed: See the new instructions.   PreserVision AREDS 2 Caps Take 1 capsule by mouth 2 (two) times daily.   rosuvastatin 10 MG tablet Commonly known as: CRESTOR TAKE 1 TABLET BY MOUTH EVERYDAY AT BEDTIME What changed: See the new instructions.   spironolactone 25 MG tablet Commonly known as: ALDACTONE TAKE 1 TABLET BY MOUTH EVERY DAY   vitamin C 500 MG tablet Commonly known as:  ASCORBIC ACID Take 1,000 mg by mouth daily. AM   Vitamin D (Ergocalciferol) 1.25 MG (50000 UNIT) Caps capsule Commonly known as: DRISDOL Take 1 capsule (50,000 Units total) by mouth every 7 (seven) days.   Voltaren 1 % Gel Generic drug: diclofenac Sodium Apply 4 g topically 4 (four) times daily.       Allergies  Allergen Reactions  . Allopurinol Swelling    Feet and legs swell  . Mobic [Meloxicam] Swelling and Other (See Comments)    Feet and Leg swelling     Procedures /Studies:   CT Lumbar Spine Wo Contrast  Result Date: 07/07/2020 CLINICAL DATA:  Initial evaluation for low back pain, weakness and heaviness in legs, with pain in right hip and leg. EXAM: CT LUMBAR SPINE WITHOUT CONTRAST TECHNIQUE: Multidetector CT imaging of the lumbar spine  was performed without intravenous contrast administration. Multiplanar CT image reconstructions were also generated. COMPARISON:  Previous MRI from 01/29/2008. FINDINGS: Segmentation: Standard. Lowest well-formed disc space labeled the L5-S1 level. Alignment: Mild dextroscoliosis with apex at L3-4. Alignment otherwise normal with preservation of the normal lumbar lordosis. Vertebrae: Sequelae of prior PLIF present at L4 through S1. Hardware intact without complication. Vertebral body height maintained without acute or chronic fracture. Visualized sacrum and pelvis intact. SI joints approximated symmetric. No discrete or worrisome osseous lesions. Paraspinal and other soft tissues: Mild paraspinous edema noted adjacent to the bulging L2-3 disc. Superimposed disc desiccation noted with no other features of acute discitis. Paraspinous soft tissues demonstrate no other acute finding. Normal expected chronic postoperative changes present within the lower posterior paraspinous soft tissues. Moderate aorto bi-iliac atherosclerotic disease. 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 Colonic diverticulosis noted. Disc levels: T12-L1: Mild disc bulge, slightly eccentric  to the left. Mild facet hypertrophy. No significant spinal stenosis. Foramina remain patent. L1-2: Mild disc bulge with bilateral facet hypertrophy. No significant spinal stenosis. Foramina remain patent. L2-3: Diffuse disc bulge with disc desiccation. Associated reactive endplate change with marginal endplate osteophytic spurring. Disc bulging asymmetric to the right with prominent right foraminal to extraforaminal component (series 4, image 57). Moderate bilateral facet hypertrophy. Resultant moderate canal with bilateral lateral recess stenosis. Moderate right worse than left L2 foraminal narrowing. L3-4: Degenerative intervertebral disc space narrowing with diffuse disc bulge. Associated reactive endplate spurring. Superimposed right foraminal to extraforaminal disc protrusion contacts the exiting right L3 nerve root as it courses of the right neural foramen (series 4, image 72). Moderate bilateral facet hypertrophy. Resultant moderate to severe canal with bilateral lateral recess stenosis. Mild left with mild to moderate right L3 foraminal stenosis. L4-5: Prior PLIF. No residual spinal stenosis. Foramina appear patent. L5-S1: Prior posterior fusion with decompression. Degenerative intervertebral disc space narrowing with diffuse disc bulge and reactive endplate change. Broad-based left subarticular disc osteophyte complex encroaches upon the left lateral recess. Prior left hemi laminectomy. Right worse than left facet degeneration. No significant residual spinal stenosis. Residual mild to moderate left L5 foraminal narrowing. Right neural foramen appears patent. IMPRESSION: 1. No acute abnormality within the lumbar spine. 2. Right foraminal to extraforaminal disc protrusion at L3-4, contacting and potentially affecting the exiting right L3 nerve root. 3. Right eccentric disc bulge and facet hypertrophy at L2-3 with resultant moderate to severe spinal stenosis, with moderate right worse than left L2 foraminal  narrowing. 4. Postoperative changes at L4-5 and L5-S1 without residual spinal stenosis. Residual mild to moderate left L5 foraminal narrowing. 5. Colonic diverticulosis without visible evidence for acute diverticulitis. 6. Aortic Atherosclerosis (ICD10-I70.0). Electronically Signed   By: Jeannine Boga M.D.   On: 07/07/2020 23:44   CT Hip Right Wo Contrast  Result Date: 07/07/2020 CLINICAL DATA:  Right hip and leg pain. EXAM: CT OF THE RIGHT HIP WITHOUT CONTRAST TECHNIQUE: Multidetector CT imaging of the right hip was performed according to the standard protocol. Multiplanar CT image reconstructions were also generated. COMPARISON:  None. FINDINGS: Postoperative changes from posterior fusion in the visualized lower lumbar spine. 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 No fracture, subluxation or dislocation. Aortoiliac atherosclerosis. Sigmoid diverticulosis. Small bowel decompressed. Prior hysterectomy. No pelvic masses or free fluid. 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 No acute bony abnormality. Aortoiliac atherosclerosis. Sigmoid diverticulosis. Electronically Signed   By: Rolm Baptise M.D.   On: 07/07/2020 22:45   DG Hip Unilat With Pelvis 2-3 Views  Right  Result Date: 07/01/2020 CLINICAL DATA:  Lower right-sided back pain. EXAM: DG HIP (WITH OR WITHOUT PELVIS) 2-3V RIGHT COMPARISON:  November 12, 2017 FINDINGS: There is no evidence of acute hip fracture or dislocation. Degenerative changes seen involving both hips in the form of joint space narrowing and acetabular sclerosis. Radiopaque pedicle screws are seen within the lower lumbar spine. Soft tissue structures are unremarkable. IMPRESSION: Degenerative and postoperative changes, as described above, without an acute osseous abnormality. Electronically Signed   By: Virgina Norfolk M.D.   On: 07/01/2020 19:32    Subjective:   Patient was seen and examined 07/09/2020, 10:41 AM Patient stable today. No acute  distress.  No issues overnight Stable for discharge.  Discharge Exam:    Vitals:   07/08/20 1943 07/08/20 1959 07/09/20 0508 07/09/20 0821  BP:  (!) 139/58 125/62 (!) 130/50  Pulse: 60 60 63 62  Resp: 16 17 17    Temp:  98.1 F (36.7 C) (!) 97.2 F (36.2 C)   TempSrc:      SpO2: 97% 99% 99%   Weight:      Height:        General: Pt lying comfortably in bed & appears in no obvious distress. Cardiovascular: S1 & S2 heard, RRR, S1/S2 +. No murmurs, rubs, gallops or clicks. No JVD or pedal edema. Respiratory: Clear to auscultation without wheezing, rhonchi or crackles. No increased work of breathing. Abdominal:  Non-distended, non-tender & soft. No organomegaly or masses appreciated. Normal bowel sounds heard. CNS: Alert and oriented. No focal deficits. Extremities: no edema, no cyanosis      The results of significant diagnostics from this hospitalization (including imaging, microbiology, ancillary and laboratory) are listed below for reference.      Microbiology:   Recent Results (from the past 240 hour(s))  Resp Panel by RT-PCR (Flu A&B, Covid) Nasopharyngeal Swab     Status: None   Collection Time: 07/07/20 11:33 PM   Specimen: Nasopharyngeal Swab; Nasopharyngeal(NP) swabs in vial transport medium  Result Value Ref Range Status   SARS Coronavirus 2 by RT PCR NEGATIVE NEGATIVE Final    Comment: (NOTE) SARS-CoV-2 target nucleic acids are NOT DETECTED.  The SARS-CoV-2 RNA is generally detectable in upper respiratory specimens during the acute phase of infection. The lowest concentration of SARS-CoV-2 viral copies this assay can detect is 138 copies/mL. A negative result does not preclude SARS-Cov-2 infection and should not be used as the sole basis for treatment or other patient management decisions. A negative result may occur with  improper specimen collection/handling, submission of specimen other than nasopharyngeal swab, presence of viral mutation(s) within  the areas targeted by this assay, and inadequate number of viral copies(<138 copies/mL). A negative result must be combined with clinical observations, patient history, and epidemiological information. The expected result is Negative.  Fact Sheet for Patients:  EntrepreneurPulse.com.au  Fact Sheet for Healthcare Providers:  IncredibleEmployment.be  This test is no t yet approved or cleared by the [place] FDA and  has been authorized for detection and/or diagnosis of SARS-CoV-2 by FDA under an Emergency Use Authorization (EUA). This EUA will remain  in effect (meaning this test can be used) for the duration of the COVID-19 declaration under Section 564(b)(1) of the Act, 21 U.S.C.section 360bbb-3(b)(1), unless the authorization is terminated  or revoked sooner.       Influenza A by PCR NEGATIVE NEGATIVE Final   Influenza B by PCR NEGATIVE NEGATIVE Final    Comment: (NOTE) The Xpert  Xpress SARS-CoV-2/FLU/RSV plus assay is intended as an aid in the diagnosis of influenza from Nasopharyngeal swab specimens and should not be used as a sole basis for treatment. Nasal washings and aspirates are unacceptable for Xpert Xpress SARS-CoV-2/FLU/RSV testing.  Fact Sheet for Patients: EntrepreneurPulse.com.au  Fact Sheet for Healthcare Providers: IncredibleEmployment.be  This test is not yet approved or cleared by the [place] FDA and has been authorized for detection and/or diagnosis of SARS-CoV-2 by FDA under an Emergency Use Authorization (EUA). This EUA will remain in effect (meaning this test can be used) for the duration of the COVID-19 declaration under Section 564(b)(1) of the Act, 21 U.S.C. section 360bbb-3(b)(1), unless the authorization is terminated or revoked.  Performed at [facility], [address], [place], [place] [address]      Labs:   CBC: Recent Labs  Lab 07/07/20 2024  WBC 9.9   NEUTROABS 6.5  HGB 13.0  HCT 40.7  MCV 87.5  PLT 132   Basic Metabolic Panel: Recent Labs  Lab 07/07/20 2024  NA 138  K 4.0  CL 100  CO2 27  GLUCOSE 120*  BUN 14  CREATININE 1.27*  CALCIUM 10.1   Liver Function Tests: Recent Labs  Lab 07/08/20 0441  AST 19  ALT 14  ALKPHOS 54  BILITOT 0.7  PROT 7.0  ALBUMIN 3.6   BNP (last 3 results) No results for input(s): BNP in the last 8760 hours. Cardiac Enzymes: No results for input(s): CKTOTAL, CKMB, CKMBINDEX, TROPONINI in the last 168 hours. CBG: Recent Labs  Lab 07/08/20 0730 07/08/20 1140 07/08/20 1617 07/08/20 1957 07/09/20 0728  GLUCAP 94 161* 190* 175* 101*   Hgb A1c Recent Labs    07/08/20 0441  HGBA1C 6.4*   Lipid Profile No results for input(s): CHOL, HDL, LDLCALC, TRIG, CHOLHDL, LDLDIRECT in the last 72 hours. Thyroid function studies No results for input(s): TSH, T4TOTAL, T3FREE, THYROIDAB in the last 72 hours.  Invalid input(s): FREET3 Anemia work up No results for input(s): VITAMINB12, FOLATE, FERRITIN, TIBC, IRON, RETICCTPCT in the last 72 hours. Urinalysis    Component Value Date/Time   COLORURINE YELLOW 07/19/2018 1548   APPEARANCEUR Cloudy (A) 02/28/2020 1450   LABSPEC <1.005 (L) 07/19/2018 1548   PHURINE 6.0 07/19/2018 1548   GLUCOSEU Trace (A) 02/28/2020 1450   HGBUR NEGATIVE 07/19/2018 1548   BILIRUBINUR Negative 02/28/2020 1450   KETONESUR NEGATIVE 07/19/2018 1548   PROTEINUR Trace (A) 02/28/2020 1450   PROTEINUR NEGATIVE 07/19/2018 1548   UROBILINOGEN negative 04/06/2015 1517   UROBILINOGEN 0.2 05/14/2011 1942   NITRITE Positive (A) 02/28/2020 1450   NITRITE NEGATIVE 07/19/2018 1548   LEUKOCYTESUR 3+ (A) 02/28/2020 1450   LEUKOCYTESUR NEGATIVE 07/19/2018 1548         Time coordinating discharge: Over 45 minutes  SIGNED: Deatra James, MD, FACP, FHM. [facility],  Please use [email or web address] to Page If 7PM-7AM, please contact night-coverage [email or web address] [facility] 07/09/2020, 10:41 AM

## 2020-07-09 NOTE — Evaluation (Signed)
Physical Therapy Evaluation Patient Details Name: Lorraine Leonard MRN: [id number] DOB: [birth year] Today's Date: 07/09/2020   History of Present Illness  HPI: Lorraine Leonard is a 80 y.o. female with medical history significant of chronic diastolic CHF, allergic rhinitis, anxiety, depression, paroxysmal atrial fibrillation, history of pacemaker placement, COPD, diverticulosis, GERD, glaucoma, bilateral macular degeneration, gout, hyperlipidemia, hypertension, hypothyroidism, type 2 diabetes, pulmonary fibrosis, rheumatoid arthritis, sleep apnea on nasal cannula oxygen at bedtime, history of back surgeries in her 67s and around age 80 who is coming to the emergency department for the second time in 6 days with complaints of exacerbation with progressive worsening of back pain for the last 3 days.  She feels some numbness of the the feet, particularly on the right side.  She stated that she has been unable to get out of bed due to pain and numbness sensation.  She denies fecal or urinary incontinence, but states is very painful to move around.  She denies fever, headache, rhinorrhea, sore throat, wheezing or hemoptysis.  No chest pain, palpitations, diaphoresis, PND, orthopnea or recent lower extremity pitting edema.  Denies abdominal pain, nausea, vomiting, diarrhea, constipation, melena or hematochezia.  No dysuria, frequency or materia.  No polyuria, polydipsia, polyphagia or blurred vision.    Clinical Impression  Patient demonstrates slightly labored movement for sitting up at bedside without c/o low back pain, c/o mild dizziness and nausea with BP WNL, able to transfer to chair without problem, but limited for walking due to dizziness and nausea - RN notified.  Plan: patient to be discharged home today and discharged from physical therapy to care of nursing for ambulation daily as tolerated for length of stay.   Follow Up Recommendations Home health PT    Equipment Recommendations  None recommended by  PT    Recommendations for Other Services       Precautions / Restrictions Precautions Precautions: Fall Restrictions Weight Bearing Restrictions: No      Mobility  Bed Mobility Overal bed mobility: Needs Assistance Bed Mobility: Supine to Sit     Supine to sit: Supervision;Modified independent (Device/Increase time)     General bed mobility comments: slightly labored movement without c/o back pain    Transfers Overall transfer level: Needs assistance Equipment used: Rolling walker (2 wheeled) Transfers: Sit to/from [facility] Sit to Stand: Supervision;Min guard Stand pivot transfers: Supervision;Min guard       General transfer comment: slightly labored movement  Ambulation/Gait Ambulation/Gait assistance: Min guard Gait Distance (Feet): 5 Feet Assistive device: Rolling walker (2 wheeled) Gait Pattern/deviations: Decreased step length - right;Decreased step length - left;Decreased stride length Gait velocity: decreased   General Gait Details: slow labored cadence without loss of balance, limited mostly due to c/o nausea and mild dizziness  Stairs            Wheelchair Mobility    Modified Rankin (Stroke Patients Only)       Balance Overall balance assessment: Needs assistance Sitting-balance support: Feet supported;No upper extremity supported Sitting balance-Leahy Scale: Good Sitting balance - Comments: seated at EOB   Standing balance support: Bilateral upper extremity supported;During functional activity Standing balance-Leahy Scale: Fair Standing balance comment: fair/good using RW                             Pertinent Vitals/Pain Pain Assessment: 0-10 Pain Score: 2  Pain Location: R hip Pain Descriptors / Indicators: Discomfort Pain Intervention(s): Limited activity within patient's  tolerance;Monitored during session    [place] expects to be discharged to:: Private residence Living  Arrangements: Spouse/significant other Available Help at Discharge: Family;Available 24 hours/day Type of Home: Mobile home Home Access: Ramped entrance     Home Layout: One level Home Equipment: [place] - 4 wheels;Bedside commode;Shower seat;Grab bars - tub/shower Additional Comments: son lives very close and does not work; reportedly can provide 24/7 assist. Pt has another son nearby as well but he works.    Prior Function Level of Independence: Needs assistance   Gait / Transfers Assistance Needed: household ambulator leaning on walls and furniture for support, sometimes uses Rollator  ADL's / Homemaking Assistance Needed: assisted by family        Hand Dominance   Dominant Hand: Right    Extremity/Trunk Assessment   Upper Extremity Assessment Upper Extremity Assessment: Defer to OT evaluation    Lower Extremity Assessment Lower Extremity Assessment: Generalized weakness    Cervical / Trunk Assessment Cervical / Trunk Assessment: Normal  Communication   Communication: No difficulties  Cognition Arousal/Alertness: Awake/alert Behavior During Therapy: WFL for tasks assessed/performed Overall Cognitive Status: Within Functional Limits for tasks assessed                                        General Comments      Exercises     Assessment/Plan    PT Assessment All further PT needs can be met in the next venue of care  PT Problem List Decreased strength;Decreased activity tolerance;Decreased balance;Decreased mobility       PT Treatment Interventions      PT Goals (Current goals can be found in the Care Plan section)  Acute Rehab PT Goals Patient Stated Goal: Return home with family to assist PT Goal Formulation: With patient Time For Goal Achievement: 07/09/20 Potential to Achieve Goals: Good    Frequency     Barriers to discharge        Co-evaluation PT/OT/SLP Co-Evaluation/Treatment: Yes Reason for Co-Treatment: For  patient/therapist safety;To address functional/ADL transfers PT goals addressed during session: Mobility/safety with mobility;Balance;Proper use of DME OT goals addressed during session: ADL's and self-care;Strengthening/ROM       AM-PAC PT "6 Clicks" Mobility  Outcome Measure Help needed turning from your back to your side while in a flat bed without using bedrails?: None Help needed moving from lying on your back to sitting on the side of a flat bed without using bedrails?: None Help needed moving to and from a bed to a chair (including a wheelchair)?: A Little Help needed standing up from a chair using your arms (e.g., wheelchair or bedside chair)?: A Little Help needed to walk in hospital room?: A Little Help needed climbing 3-5 steps with a railing? : A Lot 6 Click Score: 19    End of Session   Activity Tolerance: Patient tolerated treatment well;Patient limited by fatigue Patient left: in chair;with call bell/phone within reach Nurse Communication: Mobility status PT Visit Diagnosis: Unsteadiness on feet (R26.81);Other abnormalities of gait and mobility (R26.89);Muscle weakness (generalized) (M62.81)    Time: 3329-5188 PT Time Calculation (min) (ACUTE ONLY): 26 min   Charges:   PT Evaluation $PT Eval Moderate Complexity: 1 Mod PT Treatments $Therapeutic Activity: 23-37 mins        11:05 AM, 07/09/20 Lonell Grandchild, MPT Physical Therapist with [facility] [phone] office [phone] mobile phone

## 2020-07-09 NOTE — Plan of Care (Signed)
  Problem: Acute Rehab OT Goals (only OT should resolve) Goal: Pt. Will Perform Grooming Flowsheets (Taken 07/09/2020 1033) Pt Will Perform Grooming:  with modified independence  standing Goal: Pt. Will Perform Lower Body Dressing Flowsheets (Taken 07/09/2020 1033) Pt Will Perform Lower Body Dressing:  with modified independence  sit to/from stand Goal: Pt. Will Transfer To Toilet Flowsheets (Taken 07/09/2020 1033) Pt Will Transfer to Toilet:  with modified independence  stand pivot transfer  grab bars  Akul Leggette OT, MOT

## 2020-07-09 NOTE — Evaluation (Signed)
Occupational Therapy Evaluation Patient Details Name: Lorraine Leonard MRN: [id number] DOB: 20-May-1940 Today's Date: 07/09/2020    History of Present Illness HPI: Lorraine Leonard is a 80 y.o. female with medical history significant of chronic diastolic CHF, allergic rhinitis, anxiety, depression, paroxysmal atrial fibrillation, history of pacemaker placement, COPD, diverticulosis, GERD, glaucoma, bilateral macular degeneration, gout, hyperlipidemia, hypertension, hypothyroidism, type 2 diabetes, pulmonary fibrosis, rheumatoid arthritis, sleep apnea on nasal cannula oxygen at bedtime, history of back surgeries in her 30s and around age 73 who is coming to the emergency department for the second time in 6 days with complaints of exacerbation with progressive worsening of back pain for the last 3 days.  She feels some numbness of the the feet, particularly on the right side.  She stated that she has been unable to get out of bed due to pain and numbness sensation.  She denies fecal or urinary incontinence, but states is very painful to move around.  She denies fever, headache, rhinorrhea, sore throat, wheezing or hemoptysis.  No chest pain, palpitations, diaphoresis, PND, orthopnea or recent lower extremity pitting edema.  Denies abdominal pain, nausea, vomiting, diarrhea, constipation, melena or hematochezia.  No dysuria, frequency or materia.  No polyuria, polydipsia, polyphagia or blurred vision.   Clinical Impression   Pt agreeable to OT evaluation. Pt demonstrated need for min guard to SPV assist for functional transfers and standing. Pt taken off room O2 during functional activity due to O2 sat at 98% or more typically. When at EOB pt BP was 125/77. After standing and reports of dizziness pt BP was at 135/69. Pt reported feeling sick and dizzy. Pt was able to complete bed mobility with mod I to SPV. Pt would benefit from continued acute OT services to address endurance and dizziness during functional  standing tasks to increase overall ADL independence. Pt recommended for d/c home with assist intermittently mostly when standing or transfering.     Follow Up Recommendations  Home health OT;Supervision - Intermittent    Equipment Recommendations  None recommended by OT           Precautions / Restrictions Precautions Precautions: Fall Restrictions Weight Bearing Restrictions: No      Mobility Bed Mobility Overal bed mobility: Needs Assistance Bed Mobility: Supine to Sit     Supine to sit: Supervision;Modified independent (Device/Increase time)     General bed mobility comments: supine to sit at EOB    Transfers Overall transfer level: Needs assistance Equipment used: Rolling walker (2 wheeled) Transfers: Sit to/from [facility] Sit to Stand: Supervision;Min guard Stand pivot transfers: Supervision;Min guard       General transfer comment: Min guard assist using RW; EOB to chair; brief ambulation forward prior to report of dizziness    Balance Overall balance assessment: Needs assistance Sitting-balance support: Feet supported Sitting balance-Leahy Scale: Good Sitting balance - Comments: eob   Standing balance support: Bilateral upper extremity supported;During functional activity Standing balance-Leahy Scale: Good Standing balance comment: Fair to good; more unsteady when reported dizziness during functional ambulation and transfer                           ADL either performed or assessed with clinical judgement   ADL Overall ADL's : Needs assistance/impaired                                 Tub/ Shower  Transfer: Min Research scientist (physical sciences) Details (indicate cue type and reason): simulated via EOB to chair transfer with RW and mingaurd assist. Reports of significant dizziness when upright standing.         Vision Baseline Vision/History: No visual deficits                   Pertinent Vitals/Pain Pain Assessment: 0-10 Pain Score: 2  Pain Location: R hip Pain Descriptors / Indicators: Discomfort (Per observation) Pain Intervention(s): Monitored during session     Hand Dominance Right   Extremity/Trunk Assessment Upper Extremity Assessment Upper Extremity Assessment: Overall WFL for tasks assessed;Generalized weakness (Limited mostly due to dizziness when upright.)   Lower Extremity Assessment Lower Extremity Assessment: Defer to PT evaluation   Cervical / Trunk Assessment Cervical / Trunk Assessment: Normal   Communication Communication Communication: No difficulties   Cognition Arousal/Alertness: Awake/alert Behavior During Therapy: WFL for tasks assessed/performed Overall Cognitive Status: Within Functional Limits for tasks assessed                                                      Home Living Family/patient expects to be discharged to:: Private residence Living Arrangements: Spouse/significant other Available Help at Discharge: Family;Available 24 hours/day Type of Home: Mobile home Home Access: Ramped entrance     Home Layout: One level     Bathroom Shower/Tub: Occupational psychologist: Handicapped height     Home Equipment: Environmental consultant - 4 wheels;Bedside commode;Shower seat;Grab bars - tub/shower   Additional Comments: son lives very close and does not work; reportedly can provide 24/7 assist. Pt has another son nearby as well but he works.      Prior Functioning/Environment Level of Independence: Independent                 OT Problem List: Decreased activity tolerance;Impaired balance (sitting and/or standing)      OT Treatment/Interventions: Self-care/ADL training;Therapeutic exercise;Therapeutic activities;Balance training;Patient/family education    OT Goals(Current goals can be found in the care plan section) Acute Rehab OT Goals Patient Stated Goal: Return home OT Goal  Formulation: With patient Time For Goal Achievement: 07/23/20 Potential to Achieve Goals: Good  OT Frequency: Min 2X/week               Co-evaluation PT/OT/SLP Co-Evaluation/Treatment: Yes Reason for Co-Treatment: To address functional/ADL transfers   OT goals addressed during session: ADL's and self-care;Strengthening/ROM      AM-PAC OT "6 Clicks" Daily Activity     Outcome Measure Help from another person eating meals?: None Help from another person taking care of personal grooming?: None Help from another person toileting, which includes using toliet, bedpan, or urinal?: A Little Help from another person bathing (including washing, rinsing, drying)?: A Little Help from another person to put on and taking off regular upper body clothing?: None Help from another person to put on and taking off regular lower body clothing?: A Little 6 Click Score: 21   End of Session Equipment Utilized During Treatment: Rolling walker  Activity Tolerance: Patient tolerated treatment well;Other (comment) (Limited by dizziness) Patient left: in chair;with chair alarm set;with call bell/phone within reach  OT Visit Diagnosis: Unsteadiness on feet (R26.81);Dizziness and giddiness (R42)                Time: 4098-1191 OT  Time Calculation (min): 20 min Charges:  OT General Charges $OT Visit: 1 Visit OT Evaluation $OT Eval Low Complexity: 1 Low  Gerritt Galentine OT, MOT   Larey Seat 07/09/2020, 10:10 AM

## 2020-07-09 NOTE — TOC Transition Note (Signed)
Transition of Care [facility]) - CM/SW Discharge Note   Patient Details  Name: Lorraine Leonard MRN: [id number] Date of Birth: [birth year]  [facility] CM/SW Contact:  Salome Arnt, LCSW Phone Number: 07/09/2020, 11:47 AM   Clinical Narrative:  Pt d/c today. LCSW notified by MD that family requesting SNF. PT/OT evaluated pt and recommend home health. LCSW spoke with pt briefly who reports she thought she could go to SNF for 3 days but is not interested in extended SNF placement. LCSW received call from pt's daughter, Lorraine Leonard who states she lives out of town and her brother [name] lives behind pt/pt's husband and has been staying with them to care for pt's husband as he is home with hospice care. Lorraine Leonard indicates they were hoping pt could go to SNF for a few days. LCSW explained recommendation was for home health and without skilled need, pt would have to go to SNF under Medicaid. She understands pt would have to stay at SNF for 30 days and be prepared to turn check over to facility. Lorraine Leonard states they are not interested in this option. Family is frustrated as pt refuses to work with them. She was on [facility] wait list for about 2 years and when they called and said aid could start the next day, pt refused so pt's application was canceled. They also report some behaviors which are unusual for pt, like yelling and cussing at family. Pt had appointment with PCP today to be evaluated for dementia. Discussed home health with pt and daughter who are agreeable. Referral made to [facility] who accepts for PT, OT, RN, SW, and aid. Daughter is aware if placement becomes necessary, home health social worker can assist from home. [name] appreciative of information and will have a family member pick up pt later today. MD and RN updated.     Final next level of care: [place] Barriers to Discharge: Barriers Resolved   Patient Goals and CMS Choice     Choice offered to / list  presented to : [place]  Discharge Placement                  Name of family member notified: Lorraine Leonard- daughter Patient and family notified of of transfer: 07/09/20  Discharge Plan and Services                DME Arranged: N/A DME Agency: NA       HH Arranged: RN,Nurse's [place] Work [employer] Agency: Microbiologist ([place]) Date [place]: 07/09/20 Time [place]: [id number] Representative spoke with at [place]: [place] ([place]) Interventions     Readmission Risk Interventions No flowsheet data found.

## 2020-07-09 NOTE — TOC Progression Note (Signed)
Transition of Care [facility]) - Progression Note    Patient Details  Name: Lorraine Leonard MRN: [id number] Date of Birth: [birth year]  Transition of Care [facility]) CM/SW Contact  Salome Arnt, [place] Phone Number: 07/09/2020, 8:32 AM  Clinical Narrative: TOC received consult for medication assistance. LCSW confirmed with pt she has [facility] Medicare and Medicaid and has no difficulty affording medications.            Expected Discharge Plan and Services                                                 Social Determinants of Health (SDOH) Interventions    Readmission Risk Interventions No flowsheet data found.

## 2020-07-09 NOTE — Telephone Encounter (Signed)
Contact Date: 07/09/2020 Contacted By: Nigel Berthold  Transition Care Management Follow-up Telephone Call  Date of discharge and from where: [facility] 07/09/20  Discharge Diagnosis:Lumbar pain   How have you been since you were released from the hospital? Patient states she is better but she is still having lumbar pain and weakness.  Any questions or concerns? No   Items Reviewed:  Did the pt receive and understand the discharge instructions provided? Yes   Medications obtained and verified? Yes   Any new allergies since your discharge? No   Dietary orders reviewed? Yes  Do you have support at home? Yes   Discontinued Medications STOP taking these medications   Breo Ellipta 100-25 MCG/INH Aepb Generic drug: fluticasone furoate-vilanterol   cephALEXin 250 MG capsule Commonly known as: KEFLEX   estradiol 0.1 MG/GM vaginal cream Commonly known as: ESTRACE   famotidine 20 MG tablet Commonly known as: PEPCID   vitamin E 180 MG (400 UNITS) capsule    New Medications Added ciprofloxacin (CIPRO) 500 MG tablet  methocarbamol (ROBAXIN) 500 MG tablet   oxyCODONE (OXY IR/ROXICODONE) 5 MG immediate release tablet   Current Medication List Allergies as of 07/09/2020      Reactions   Allopurinol Swelling   Feet and legs swell   Mobic [meloxicam] Swelling, Other (See Comments)   Feet and Leg swelling      Medication List       Accurate as of July 09, 2020  3:10 PM. If you have any questions, ask your nurse or doctor.        acetaminophen 500 MG tablet Commonly known as: TYLENOL Take 500 mg by mouth every 6 (six) hours as needed (pain).   acetaminophen 325 MG tablet Commonly known as: TYLENOL Take 2 tablets (650 mg total) by mouth every 6 (six) hours as needed for mild pain (or Fever >/= 101).   albuterol (2.5 MG/3ML) 0.083% nebulizer solution Commonly known as: PROVENTIL Take 3 mLs (2.5 mg total) by nebulization every 6 (six) hours as  needed for wheezing or shortness of breath. J96.01   albuterol 108 (90 Base) MCG/ACT inhaler Commonly known as: VENTOLIN HFA TAKE 2 PUFFS BY MOUTH EVERY 6 HOURS AS NEEDED FOR WHEEZE OR SHORTNESS OF BREATH   atenolol 25 MG tablet Commonly known as: TENORMIN Take 1 tablet (25 mg total) by mouth daily.   calcium citrate 950 (200 Ca) MG tablet Commonly known as: CALCITRATE - dosed in mg elemental calcium Take 1 tablet by mouth 2 (two) times daily.   ciprofloxacin 500 MG tablet Commonly known as: Cipro Take 1 tablet (500 mg total) by mouth 2 (two) times daily for 5 days.   cyanocobalamin 1000 MCG tablet Take 1,000 mcg by mouth daily. Vitamin b12 every AM   Eliquis 5 MG Tabs tablet Generic drug: apixaban TAKE 1 TABLET BY MOUTH TWICE A DAY   EPINEPHrine 0.3 mg/0.3 mL Soaj injection Commonly known as: EPI-PEN Inject 0.3 mg into the muscle as needed for anaphylaxis.   fluticasone 50 MCG/ACT nasal spray Commonly known as: FLONASE PLACE 1 SPRAY INTO BOTH NOSTRILS 2 (TWO) TIMES DAILY AS NEEDED FOR ALLERGIES OR RHINITIS.   furosemide 40 MG tablet Commonly known as: Lasix Take 1 tablet (40 mg total) by mouth 2 (two) times daily.   levocetirizine 5 MG tablet Commonly known as: XYZAL TAKE 1 TABLET BY MOUTH EVERY DAY IN THE EVENING What changed:   how much to take  how to take this   levothyroxine 125  MCG tablet Commonly known as: SYNTHROID Take 1 tablet (125 mcg total) by mouth daily.   metFORMIN 500 MG 24 hr tablet Commonly known as: GLUCOPHAGE-XR TAKE 1 TABLET BY MOUTH EVERY DAY WITH BREAKFAST What changed:   how much to take  how to take this  when to take this  additional instructions   methocarbamol 500 MG tablet Commonly known as: ROBAXIN Take 1 tablet (500 mg total) by mouth 2 (two) times daily for 20 days.   Olopatadine HCl 0.2 % Soln INSTILL 1 DROP INTO BOTH EYES EVERY DAY   omeprazole 20 MG capsule Commonly known as: PRILOSEC TAKE 1 CAPSULE BY MOUTH  EVERY DAY   ondansetron 8 MG tablet Commonly known as: ZOFRAN Take 1 tablet (8 mg total) by mouth every 8 (eight) hours as needed for up to 20 days for nausea or vomiting.   oxyCODONE 5 MG immediate release tablet Commonly known as: Oxy IR/ROXICODONE Take 1 tablet (5 mg total) by mouth every 6 (six) hours as needed for up to 3 days for moderate pain, severe pain or breakthrough pain.   OXYGEN Inhale 3 L into the lungs every evening.   Polyethyl Glycol-Propyl Glycol 0.4-0.3 % Gel ophthalmic gel Commonly known as: SYSTANE Place 1 application into both eyes at bedtime as needed (dry eyes).   predniSONE 5 MG tablet Commonly known as: DELTASONE TAKE 1 TABLET BY MOUTH EVERY DAY WITH BREAKFAST What changed: See the new instructions.   PreserVision AREDS 2 Caps Take 1 capsule by mouth 2 (two) times daily.   rosuvastatin 10 MG tablet Commonly known as: CRESTOR TAKE 1 TABLET BY MOUTH EVERYDAY AT BEDTIME What changed: See the new instructions.   spironolactone 25 MG tablet Commonly known as: ALDACTONE TAKE 1 TABLET BY MOUTH EVERY DAY   vitamin C 500 MG tablet Commonly known as: ASCORBIC ACID Take 1,000 mg by mouth daily. AM   Vitamin D (Ergocalciferol) 1.25 MG (50000 UNIT) Caps capsule Commonly known as: DRISDOL Take 1 capsule (50,000 Units total) by mouth every 7 (seven) days.   Voltaren 1 % Gel Generic drug: diclofenac Sodium Apply 4 g topically 4 (four) times daily.        Home Care and Equipment/Supplies: Were home health services ordered? yes If so, what is the name of the agency? Patient is not sure but states they are coming 07/10/20. Has the agency set up a time to come to the patient's home? no Were any new equipment or medical supplies ordered?  No What is the name of the medical supply agency?  Were you able to get the supplies/equipment? not applicable Do you have any questions related to the use of the equipment or supplies? No  Functional Questionnaire: (I  = Independent and D = Dependent) ADLs: I    Bathing/Dressing- I  Meal Prep- D  Eating- I  Maintaining continence- I  Transferring/Ambulation- I  Managing Meds- D  Follow up appointments reviewed:   PCP Hospital f/u appt confirmed? Yes  Scheduled to see Jac Canavan on 07/16/20 @ 2:15pm.  [facility] f/u appt confirmed? No    Are transportation arrangements needed? No   If their condition worsens, is the pt aware to call PCP or go to the Emergency Dept.? Yes  Was the patient provided with contact information for the PCP's office or ED? Yes  Was to pt encouraged to call back with questions or concerns? Yes

## 2020-07-10 ENCOUNTER — Other Ambulatory Visit: Payer: Self-pay | Admitting: Family

## 2020-07-10 DIAGNOSIS — E1159 Type 2 diabetes mellitus with other circulatory complications: Secondary | ICD-10-CM | POA: Diagnosis not present

## 2020-07-10 DIAGNOSIS — M48061 Spinal stenosis, lumbar region without neurogenic claudication: Secondary | ICD-10-CM | POA: Diagnosis not present

## 2020-07-10 DIAGNOSIS — N1832 Chronic kidney disease, stage 3b: Secondary | ICD-10-CM | POA: Diagnosis not present

## 2020-07-10 DIAGNOSIS — N390 Urinary tract infection, site not specified: Secondary | ICD-10-CM | POA: Diagnosis not present

## 2020-07-10 DIAGNOSIS — M5136 Other intervertebral disc degeneration, lumbar region: Secondary | ICD-10-CM | POA: Diagnosis not present

## 2020-07-10 DIAGNOSIS — M5126 Other intervertebral disc displacement, lumbar region: Secondary | ICD-10-CM | POA: Diagnosis not present

## 2020-07-10 DIAGNOSIS — G8929 Other chronic pain: Secondary | ICD-10-CM | POA: Diagnosis not present

## 2020-07-10 DIAGNOSIS — I480 Paroxysmal atrial fibrillation: Secondary | ICD-10-CM | POA: Diagnosis not present

## 2020-07-10 DIAGNOSIS — I5032 Chronic diastolic (congestive) heart failure: Secondary | ICD-10-CM | POA: Diagnosis not present

## 2020-07-10 DIAGNOSIS — E1169 Type 2 diabetes mellitus with other specified complication: Secondary | ICD-10-CM | POA: Diagnosis not present

## 2020-07-10 DIAGNOSIS — J449 Chronic obstructive pulmonary disease, unspecified: Secondary | ICD-10-CM

## 2020-07-10 DIAGNOSIS — J8410 Pulmonary fibrosis, unspecified: Secondary | ICD-10-CM | POA: Diagnosis not present

## 2020-07-10 DIAGNOSIS — I152 Hypertension secondary to endocrine disorders: Secondary | ICD-10-CM | POA: Diagnosis not present

## 2020-07-11 DIAGNOSIS — M5136 Other intervertebral disc degeneration, lumbar region: Secondary | ICD-10-CM | POA: Diagnosis not present

## 2020-07-11 DIAGNOSIS — E1159 Type 2 diabetes mellitus with other circulatory complications: Secondary | ICD-10-CM | POA: Diagnosis not present

## 2020-07-11 DIAGNOSIS — N390 Urinary tract infection, site not specified: Secondary | ICD-10-CM | POA: Diagnosis not present

## 2020-07-11 DIAGNOSIS — J8410 Pulmonary fibrosis, unspecified: Secondary | ICD-10-CM | POA: Diagnosis not present

## 2020-07-11 DIAGNOSIS — I152 Hypertension secondary to endocrine disorders: Secondary | ICD-10-CM | POA: Diagnosis not present

## 2020-07-11 DIAGNOSIS — I480 Paroxysmal atrial fibrillation: Secondary | ICD-10-CM | POA: Diagnosis not present

## 2020-07-11 DIAGNOSIS — G8929 Other chronic pain: Secondary | ICD-10-CM | POA: Diagnosis not present

## 2020-07-11 DIAGNOSIS — M5126 Other intervertebral disc displacement, lumbar region: Secondary | ICD-10-CM | POA: Diagnosis not present

## 2020-07-11 DIAGNOSIS — M48061 Spinal stenosis, lumbar region without neurogenic claudication: Secondary | ICD-10-CM | POA: Diagnosis not present

## 2020-07-11 DIAGNOSIS — N1832 Chronic kidney disease, stage 3b: Secondary | ICD-10-CM | POA: Diagnosis not present

## 2020-07-11 DIAGNOSIS — I5032 Chronic diastolic (congestive) heart failure: Secondary | ICD-10-CM | POA: Diagnosis not present

## 2020-07-11 DIAGNOSIS — E1169 Type 2 diabetes mellitus with other specified complication: Secondary | ICD-10-CM | POA: Diagnosis not present

## 2020-07-11 DIAGNOSIS — J449 Chronic obstructive pulmonary disease, unspecified: Secondary | ICD-10-CM | POA: Diagnosis not present

## 2020-07-12 DIAGNOSIS — J8410 Pulmonary fibrosis, unspecified: Secondary | ICD-10-CM | POA: Diagnosis not present

## 2020-07-12 DIAGNOSIS — I5032 Chronic diastolic (congestive) heart failure: Secondary | ICD-10-CM | POA: Diagnosis not present

## 2020-07-12 DIAGNOSIS — M48061 Spinal stenosis, lumbar region without neurogenic claudication: Secondary | ICD-10-CM | POA: Diagnosis not present

## 2020-07-12 DIAGNOSIS — N390 Urinary tract infection, site not specified: Secondary | ICD-10-CM | POA: Diagnosis not present

## 2020-07-12 DIAGNOSIS — E1159 Type 2 diabetes mellitus with other circulatory complications: Secondary | ICD-10-CM | POA: Diagnosis not present

## 2020-07-12 DIAGNOSIS — I480 Paroxysmal atrial fibrillation: Secondary | ICD-10-CM | POA: Diagnosis not present

## 2020-07-12 DIAGNOSIS — G8929 Other chronic pain: Secondary | ICD-10-CM | POA: Diagnosis not present

## 2020-07-12 DIAGNOSIS — E1169 Type 2 diabetes mellitus with other specified complication: Secondary | ICD-10-CM | POA: Diagnosis not present

## 2020-07-12 DIAGNOSIS — M5126 Other intervertebral disc displacement, lumbar region: Secondary | ICD-10-CM | POA: Diagnosis not present

## 2020-07-12 DIAGNOSIS — M5136 Other intervertebral disc degeneration, lumbar region: Secondary | ICD-10-CM | POA: Diagnosis not present

## 2020-07-12 DIAGNOSIS — N1832 Chronic kidney disease, stage 3b: Secondary | ICD-10-CM | POA: Diagnosis not present

## 2020-07-12 DIAGNOSIS — J449 Chronic obstructive pulmonary disease, unspecified: Secondary | ICD-10-CM | POA: Diagnosis not present

## 2020-07-12 DIAGNOSIS — I152 Hypertension secondary to endocrine disorders: Secondary | ICD-10-CM | POA: Diagnosis not present

## 2020-07-13 DIAGNOSIS — N390 Urinary tract infection, site not specified: Secondary | ICD-10-CM | POA: Diagnosis not present

## 2020-07-13 DIAGNOSIS — E1159 Type 2 diabetes mellitus with other circulatory complications: Secondary | ICD-10-CM | POA: Diagnosis not present

## 2020-07-13 DIAGNOSIS — E1169 Type 2 diabetes mellitus with other specified complication: Secondary | ICD-10-CM | POA: Diagnosis not present

## 2020-07-13 DIAGNOSIS — M5136 Other intervertebral disc degeneration, lumbar region: Secondary | ICD-10-CM | POA: Diagnosis not present

## 2020-07-13 DIAGNOSIS — I5032 Chronic diastolic (congestive) heart failure: Secondary | ICD-10-CM | POA: Diagnosis not present

## 2020-07-13 DIAGNOSIS — J8410 Pulmonary fibrosis, unspecified: Secondary | ICD-10-CM | POA: Diagnosis not present

## 2020-07-13 DIAGNOSIS — M48061 Spinal stenosis, lumbar region without neurogenic claudication: Secondary | ICD-10-CM | POA: Diagnosis not present

## 2020-07-13 DIAGNOSIS — I152 Hypertension secondary to endocrine disorders: Secondary | ICD-10-CM | POA: Diagnosis not present

## 2020-07-13 DIAGNOSIS — G8929 Other chronic pain: Secondary | ICD-10-CM | POA: Diagnosis not present

## 2020-07-13 DIAGNOSIS — M5126 Other intervertebral disc displacement, lumbar region: Secondary | ICD-10-CM | POA: Diagnosis not present

## 2020-07-13 DIAGNOSIS — N1832 Chronic kidney disease, stage 3b: Secondary | ICD-10-CM | POA: Diagnosis not present

## 2020-07-13 DIAGNOSIS — I480 Paroxysmal atrial fibrillation: Secondary | ICD-10-CM | POA: Diagnosis not present

## 2020-07-13 DIAGNOSIS — J449 Chronic obstructive pulmonary disease, unspecified: Secondary | ICD-10-CM | POA: Diagnosis not present

## 2020-07-14 DIAGNOSIS — I5032 Chronic diastolic (congestive) heart failure: Secondary | ICD-10-CM | POA: Diagnosis not present

## 2020-07-14 DIAGNOSIS — N1832 Chronic kidney disease, stage 3b: Secondary | ICD-10-CM | POA: Diagnosis not present

## 2020-07-14 DIAGNOSIS — M5126 Other intervertebral disc displacement, lumbar region: Secondary | ICD-10-CM | POA: Diagnosis not present

## 2020-07-14 DIAGNOSIS — J449 Chronic obstructive pulmonary disease, unspecified: Secondary | ICD-10-CM | POA: Diagnosis not present

## 2020-07-14 DIAGNOSIS — M48061 Spinal stenosis, lumbar region without neurogenic claudication: Secondary | ICD-10-CM | POA: Diagnosis not present

## 2020-07-14 DIAGNOSIS — N390 Urinary tract infection, site not specified: Secondary | ICD-10-CM | POA: Diagnosis not present

## 2020-07-14 DIAGNOSIS — E1169 Type 2 diabetes mellitus with other specified complication: Secondary | ICD-10-CM | POA: Diagnosis not present

## 2020-07-14 DIAGNOSIS — E1159 Type 2 diabetes mellitus with other circulatory complications: Secondary | ICD-10-CM | POA: Diagnosis not present

## 2020-07-14 DIAGNOSIS — I480 Paroxysmal atrial fibrillation: Secondary | ICD-10-CM | POA: Diagnosis not present

## 2020-07-14 DIAGNOSIS — I152 Hypertension secondary to endocrine disorders: Secondary | ICD-10-CM | POA: Diagnosis not present

## 2020-07-14 DIAGNOSIS — M5136 Other intervertebral disc degeneration, lumbar region: Secondary | ICD-10-CM | POA: Diagnosis not present

## 2020-07-14 DIAGNOSIS — J8410 Pulmonary fibrosis, unspecified: Secondary | ICD-10-CM | POA: Diagnosis not present

## 2020-07-14 DIAGNOSIS — G8929 Other chronic pain: Secondary | ICD-10-CM | POA: Diagnosis not present

## 2020-07-16 ENCOUNTER — Ambulatory Visit: Payer: Medicare Other | Admitting: Nurse Practitioner

## 2020-07-16 ENCOUNTER — Other Ambulatory Visit: Payer: Self-pay | Admitting: Family

## 2020-07-16 DIAGNOSIS — J019 Acute sinusitis, unspecified: Secondary | ICD-10-CM

## 2020-07-17 DIAGNOSIS — I480 Paroxysmal atrial fibrillation: Secondary | ICD-10-CM | POA: Diagnosis not present

## 2020-07-17 DIAGNOSIS — I5032 Chronic diastolic (congestive) heart failure: Secondary | ICD-10-CM | POA: Diagnosis not present

## 2020-07-17 DIAGNOSIS — M5126 Other intervertebral disc displacement, lumbar region: Secondary | ICD-10-CM | POA: Diagnosis not present

## 2020-07-17 DIAGNOSIS — J8410 Pulmonary fibrosis, unspecified: Secondary | ICD-10-CM | POA: Diagnosis not present

## 2020-07-17 DIAGNOSIS — M48061 Spinal stenosis, lumbar region without neurogenic claudication: Secondary | ICD-10-CM | POA: Diagnosis not present

## 2020-07-17 DIAGNOSIS — N1832 Chronic kidney disease, stage 3b: Secondary | ICD-10-CM | POA: Diagnosis not present

## 2020-07-17 DIAGNOSIS — M5136 Other intervertebral disc degeneration, lumbar region: Secondary | ICD-10-CM | POA: Diagnosis not present

## 2020-07-17 DIAGNOSIS — E1159 Type 2 diabetes mellitus with other circulatory complications: Secondary | ICD-10-CM | POA: Diagnosis not present

## 2020-07-17 DIAGNOSIS — G8929 Other chronic pain: Secondary | ICD-10-CM | POA: Diagnosis not present

## 2020-07-17 DIAGNOSIS — N390 Urinary tract infection, site not specified: Secondary | ICD-10-CM | POA: Diagnosis not present

## 2020-07-17 DIAGNOSIS — I152 Hypertension secondary to endocrine disorders: Secondary | ICD-10-CM | POA: Diagnosis not present

## 2020-07-17 DIAGNOSIS — J449 Chronic obstructive pulmonary disease, unspecified: Secondary | ICD-10-CM | POA: Diagnosis not present

## 2020-07-17 DIAGNOSIS — E1169 Type 2 diabetes mellitus with other specified complication: Secondary | ICD-10-CM | POA: Diagnosis not present

## 2020-07-19 ENCOUNTER — Ambulatory Visit (INDEPENDENT_AMBULATORY_CARE_PROVIDER_SITE_OTHER): Payer: Medicare Other

## 2020-07-19 ENCOUNTER — Other Ambulatory Visit: Payer: Self-pay

## 2020-07-19 DIAGNOSIS — N390 Urinary tract infection, site not specified: Secondary | ICD-10-CM

## 2020-07-19 DIAGNOSIS — I700 Atherosclerosis of aorta: Secondary | ICD-10-CM

## 2020-07-19 DIAGNOSIS — M160 Bilateral primary osteoarthritis of hip: Secondary | ICD-10-CM

## 2020-07-19 DIAGNOSIS — M5126 Other intervertebral disc displacement, lumbar region: Secondary | ICD-10-CM | POA: Diagnosis not present

## 2020-07-19 DIAGNOSIS — M5136 Other intervertebral disc degeneration, lumbar region: Secondary | ICD-10-CM | POA: Diagnosis not present

## 2020-07-19 DIAGNOSIS — I5032 Chronic diastolic (congestive) heart failure: Secondary | ICD-10-CM | POA: Diagnosis not present

## 2020-07-19 DIAGNOSIS — I499 Cardiac arrhythmia, unspecified: Secondary | ICD-10-CM

## 2020-07-19 DIAGNOSIS — E1169 Type 2 diabetes mellitus with other specified complication: Secondary | ICD-10-CM

## 2020-07-19 DIAGNOSIS — G8929 Other chronic pain: Secondary | ICD-10-CM

## 2020-07-19 DIAGNOSIS — K5730 Diverticulosis of large intestine without perforation or abscess without bleeding: Secondary | ICD-10-CM

## 2020-07-19 DIAGNOSIS — I480 Paroxysmal atrial fibrillation: Secondary | ICD-10-CM

## 2020-07-19 DIAGNOSIS — M48061 Spinal stenosis, lumbar region without neurogenic claudication: Secondary | ICD-10-CM

## 2020-07-19 DIAGNOSIS — M0609 Rheumatoid arthritis without rheumatoid factor, multiple sites: Secondary | ICD-10-CM

## 2020-07-19 DIAGNOSIS — J449 Chronic obstructive pulmonary disease, unspecified: Secondary | ICD-10-CM

## 2020-07-19 DIAGNOSIS — F32A Depression, unspecified: Secondary | ICD-10-CM

## 2020-07-19 DIAGNOSIS — N1832 Chronic kidney disease, stage 3b: Secondary | ICD-10-CM | POA: Diagnosis not present

## 2020-07-19 DIAGNOSIS — I152 Hypertension secondary to endocrine disorders: Secondary | ICD-10-CM | POA: Diagnosis not present

## 2020-07-19 DIAGNOSIS — E1159 Type 2 diabetes mellitus with other circulatory complications: Secondary | ICD-10-CM

## 2020-07-19 DIAGNOSIS — E1140 Type 2 diabetes mellitus with diabetic neuropathy, unspecified: Secondary | ICD-10-CM

## 2020-07-19 DIAGNOSIS — F419 Anxiety disorder, unspecified: Secondary | ICD-10-CM

## 2020-07-19 DIAGNOSIS — G4730 Sleep apnea, unspecified: Secondary | ICD-10-CM

## 2020-07-19 DIAGNOSIS — E039 Hypothyroidism, unspecified: Secondary | ICD-10-CM

## 2020-07-19 DIAGNOSIS — J8410 Pulmonary fibrosis, unspecified: Secondary | ICD-10-CM | POA: Diagnosis not present

## 2020-07-19 DIAGNOSIS — E785 Hyperlipidemia, unspecified: Secondary | ICD-10-CM

## 2020-07-19 DIAGNOSIS — M109 Gout, unspecified: Secondary | ICD-10-CM

## 2020-07-20 ENCOUNTER — Other Ambulatory Visit: Payer: Self-pay

## 2020-07-20 ENCOUNTER — Ambulatory Visit (INDEPENDENT_AMBULATORY_CARE_PROVIDER_SITE_OTHER): Payer: Medicare Other | Admitting: Nurse Practitioner

## 2020-07-20 ENCOUNTER — Encounter: Payer: Self-pay | Admitting: Nurse Practitioner

## 2020-07-20 VITALS — BP 134/70 | HR 70 | Temp 97.8°F | Resp 20

## 2020-07-20 DIAGNOSIS — M545 Low back pain, unspecified: Secondary | ICD-10-CM

## 2020-07-20 DIAGNOSIS — Z87440 Personal history of urinary (tract) infections: Secondary | ICD-10-CM

## 2020-07-20 DIAGNOSIS — Z7689 Persons encountering health services in other specified circumstances: Secondary | ICD-10-CM

## 2020-07-20 NOTE — Progress Notes (Signed)
   Subjective:    Patient ID: Lorraine Leonard, female    DOB: October 28, 1940, 80 y.o.   MRN: [id number]   Chief Complaint: Transitions Of Care   HPI Today's visit was for Transitional Care Management.  The patient was discharged from [facility] on 07/09/20 with a primary diagnosis of lumbar pain and UTI.    Contact with the patient and/or caregiver, by a clinical staff member, was made on 07/09/20 and was documented as a telephone encounter within the EMR.  Through chart review and discussion with the patient I have determined that management of their condition is of low complexity.    Patient says she has improved since going home. Only time her back bothers her is mainly when she is getting up and down out of chair or bed. She was sent hoe on robaxin which has really helped. She was also give oxycodone but she is not taking that. She is mainly taking tylenol.    Review of Systems  Constitutional: Negative for diaphoresis.  Eyes: Negative for pain.  Respiratory: Negative for shortness of breath.   Cardiovascular: Negative for chest pain, palpitations and leg swelling.  Gastrointestinal: Negative for abdominal pain.  Endocrine: Negative for polydipsia.  Genitourinary: Negative for dysuria, frequency, pelvic pain and urgency.  Musculoskeletal: Positive for back pain.  Skin: Negative for rash.  Neurological: Negative for dizziness, weakness and headaches.  Hematological: Does not bruise/bleed easily.  All other systems reviewed and are negative.      Objective:   Physical Exam Vitals and nursing note reviewed.  Constitutional:      Appearance: She is obese.  Cardiovascular:     Rate and Rhythm: Normal rate and regular rhythm.     Heart sounds: Normal heart sounds.  Pulmonary:     Effort: Pulmonary effort is normal.     Breath sounds: Normal breath sounds.  Musculoskeletal:     Comments: Patient sitting in wheel chair Slight pain on flexion and extension of lumbar spine.. (-)  SLR bil Motor strength and sensation distally intact. Legs ar eboth slightly weak.  Skin:    General: Skin is warm.  Neurological:     General: No focal deficit present.     Mental Status: She is alert.  Psychiatric:        Mood and Affect: Mood normal.        Behavior: Behavior normal.    BP 134/70   Pulse 70   Temp 97.8 F (36.6 C) (Temporal)   Resp 20   SpO2 95%         Assessment & Plan:  Lorraine Leonard in today with chief complaint of Transitions Of Care   1. Acute midline low back pain without sciatica Continue robaxin as ordered Tylenol OTC Moist heat Continue PT at home  2. Encounter for support and coordination of transition of care hospital records reviewed    The above assessment and management plan was discussed with the patient. The patient verbalized understanding of and has agreed to the management plan. Patient is aware to call the clinic if symptoms persist or worsen. Patient is aware when to return to the clinic for a follow-up visit. Patient educated on when it is appropriate to go to the emergency department.   Mary-Margaret Hassell Done, FNP

## 2020-07-20 NOTE — Patient Instructions (Signed)
Fall Prevention in the Home, Adult Falls can cause injuries and can happen to people of all ages. There are many things you can do to make your home safe and to help prevent falls. Ask for help when making these changes. What actions can I take to prevent falls? General Instructions  Use good lighting in all rooms. Replace any light bulbs that burn out.  Turn on the lights in dark areas. Use night-lights.  Keep items that you use often in easy-to-reach places. Lower the shelves around your home if needed.  Set up your furniture so you have a clear path. Avoid moving your furniture around.  Do not have throw rugs or other things on the floor that can make you trip.  Avoid walking on wet floors.  If any of your floors are uneven, fix them.  Add color or contrast paint or tape to clearly mark and help you see: ? Grab bars or handrails. ? First and last steps of staircases. ? Where the edge of each step is.  If you use a stepladder: ? Make sure that it is fully opened. Do not climb a closed stepladder. ? Make sure the sides of the stepladder are locked in place. ? Ask someone to hold the stepladder while you use it.  Know where your pets are when moving through your home. What can I do in the bathroom?  Keep the floor dry. Clean up any water on the floor right away.  Remove soap buildup in the tub or shower.  Use nonskid mats or decals on the floor of the tub or shower.  Attach bath mats securely with double-sided, nonslip rug tape.  If you need to sit down in the shower, use a plastic, nonslip stool.  Install grab bars by the toilet and in the tub and shower. Do not use towel bars as grab bars.      What can I do in the bedroom?  Make sure that you have a light by your bed that is easy to reach.  Do not use any sheets or blankets for your bed that hang to the floor.  Have a firm chair with side arms that you can use for support when you get dressed. What can I do in  the kitchen?  Clean up any spills right away.  If you need to reach something above you, use a step stool with a grab bar.  Keep electrical cords out of the way.  Do not use floor polish or wax that makes floors slippery. What can I do with my stairs?  Do not leave any items on the stairs.  Make sure that you have a light switch at the top and the bottom of the stairs.  Make sure that there are handrails on both sides of the stairs. Fix handrails that are broken or loose.  Install nonslip stair treads on all your stairs.  Avoid having throw rugs at the top or bottom of the stairs.  Choose a carpet that does not hide the edge of the steps on the stairs.  Check carpeting to make sure that it is firmly attached to the stairs. Fix carpet that is loose or worn. What can I do on the outside of my home?  Use bright outdoor lighting.  Fix the edges of walkways and driveways and fix any cracks.  Remove anything that might make you trip as you walk through a door, such as a raised step or threshold.  Trim any   bushes or trees on paths to your home.  Check to see if handrails are loose or broken and that both sides of all steps have handrails.  Install guardrails along the edges of any raised decks and porches.  Clear paths of anything that can make you trip, such as tools or rocks.  Have leaves, snow, or ice cleared regularly.  Use sand or salt on paths during winter.  Clean up any spills in your garage right away. This includes grease or oil spills. What other actions can I take?  Wear shoes that: ? Have a low heel. Do not wear high heels. ? Have rubber bottoms. ? Feel good on your feet and fit well. ? Are closed at the toe. Do not wear open-toe sandals.  Use tools that help you move around if needed. These include: ? Canes. ? Walkers. ? Scooters. ? Crutches.  Review your medicines with your doctor. Some medicines can make you feel dizzy. This can increase your chance  of falling. Ask your doctor what else you can do to help prevent falls. Where to find more information  Centers for Disease Control and Prevention, [employer]: www.cdc.gov  National Institute on Aging: www.nia.nih.gov Contact a doctor if:  You are afraid of falling at home.  You feel weak, drowsy, or dizzy at home.  You fall at home. Summary  There are many simple things that you can do to make your home safe and to help prevent falls.  Ways to make your home safe include removing things that can make you trip and installing grab bars in the bathroom.  Ask for help when making these changes in your home. This information is not intended to replace advice given to you by your health care provider. Make sure you discuss any questions you have with your health care provider. Document Revised: 11/09/2019 Document Reviewed: 11/09/2019 Elsevier Patient Education  2021 Elsevier Inc.  

## 2020-07-21 DIAGNOSIS — M48061 Spinal stenosis, lumbar region without neurogenic claudication: Secondary | ICD-10-CM | POA: Diagnosis not present

## 2020-07-21 DIAGNOSIS — E1169 Type 2 diabetes mellitus with other specified complication: Secondary | ICD-10-CM | POA: Diagnosis not present

## 2020-07-21 DIAGNOSIS — M5126 Other intervertebral disc displacement, lumbar region: Secondary | ICD-10-CM | POA: Diagnosis not present

## 2020-07-21 DIAGNOSIS — I152 Hypertension secondary to endocrine disorders: Secondary | ICD-10-CM | POA: Diagnosis not present

## 2020-07-21 DIAGNOSIS — E1159 Type 2 diabetes mellitus with other circulatory complications: Secondary | ICD-10-CM | POA: Diagnosis not present

## 2020-07-21 DIAGNOSIS — N390 Urinary tract infection, site not specified: Secondary | ICD-10-CM | POA: Diagnosis not present

## 2020-07-21 DIAGNOSIS — I480 Paroxysmal atrial fibrillation: Secondary | ICD-10-CM | POA: Diagnosis not present

## 2020-07-21 DIAGNOSIS — I5032 Chronic diastolic (congestive) heart failure: Secondary | ICD-10-CM | POA: Diagnosis not present

## 2020-07-21 DIAGNOSIS — N1832 Chronic kidney disease, stage 3b: Secondary | ICD-10-CM | POA: Diagnosis not present

## 2020-07-21 DIAGNOSIS — G8929 Other chronic pain: Secondary | ICD-10-CM | POA: Diagnosis not present

## 2020-07-21 DIAGNOSIS — J8410 Pulmonary fibrosis, unspecified: Secondary | ICD-10-CM | POA: Diagnosis not present

## 2020-07-21 DIAGNOSIS — M5136 Other intervertebral disc degeneration, lumbar region: Secondary | ICD-10-CM | POA: Diagnosis not present

## 2020-07-21 DIAGNOSIS — J449 Chronic obstructive pulmonary disease, unspecified: Secondary | ICD-10-CM | POA: Diagnosis not present

## 2020-07-23 DIAGNOSIS — J8410 Pulmonary fibrosis, unspecified: Secondary | ICD-10-CM | POA: Diagnosis not present

## 2020-07-23 DIAGNOSIS — I480 Paroxysmal atrial fibrillation: Secondary | ICD-10-CM | POA: Diagnosis not present

## 2020-07-23 DIAGNOSIS — M48061 Spinal stenosis, lumbar region without neurogenic claudication: Secondary | ICD-10-CM | POA: Diagnosis not present

## 2020-07-23 DIAGNOSIS — N1832 Chronic kidney disease, stage 3b: Secondary | ICD-10-CM | POA: Diagnosis not present

## 2020-07-23 DIAGNOSIS — M5126 Other intervertebral disc displacement, lumbar region: Secondary | ICD-10-CM | POA: Diagnosis not present

## 2020-07-23 DIAGNOSIS — I5032 Chronic diastolic (congestive) heart failure: Secondary | ICD-10-CM | POA: Diagnosis not present

## 2020-07-23 DIAGNOSIS — M5136 Other intervertebral disc degeneration, lumbar region: Secondary | ICD-10-CM | POA: Diagnosis not present

## 2020-07-23 DIAGNOSIS — N390 Urinary tract infection, site not specified: Secondary | ICD-10-CM | POA: Diagnosis not present

## 2020-07-23 DIAGNOSIS — G8929 Other chronic pain: Secondary | ICD-10-CM | POA: Diagnosis not present

## 2020-07-23 DIAGNOSIS — I152 Hypertension secondary to endocrine disorders: Secondary | ICD-10-CM | POA: Diagnosis not present

## 2020-07-23 DIAGNOSIS — E1159 Type 2 diabetes mellitus with other circulatory complications: Secondary | ICD-10-CM | POA: Diagnosis not present

## 2020-07-23 DIAGNOSIS — J449 Chronic obstructive pulmonary disease, unspecified: Secondary | ICD-10-CM | POA: Diagnosis not present

## 2020-07-23 DIAGNOSIS — E1169 Type 2 diabetes mellitus with other specified complication: Secondary | ICD-10-CM | POA: Diagnosis not present

## 2020-07-24 ENCOUNTER — Telehealth: Payer: Self-pay

## 2020-07-24 DIAGNOSIS — J449 Chronic obstructive pulmonary disease, unspecified: Secondary | ICD-10-CM | POA: Diagnosis not present

## 2020-07-24 DIAGNOSIS — E1159 Type 2 diabetes mellitus with other circulatory complications: Secondary | ICD-10-CM | POA: Diagnosis not present

## 2020-07-24 DIAGNOSIS — I5032 Chronic diastolic (congestive) heart failure: Secondary | ICD-10-CM | POA: Diagnosis not present

## 2020-07-24 DIAGNOSIS — M5126 Other intervertebral disc displacement, lumbar region: Secondary | ICD-10-CM | POA: Diagnosis not present

## 2020-07-24 DIAGNOSIS — N1832 Chronic kidney disease, stage 3b: Secondary | ICD-10-CM | POA: Diagnosis not present

## 2020-07-24 DIAGNOSIS — N390 Urinary tract infection, site not specified: Secondary | ICD-10-CM | POA: Diagnosis not present

## 2020-07-24 DIAGNOSIS — I480 Paroxysmal atrial fibrillation: Secondary | ICD-10-CM | POA: Diagnosis not present

## 2020-07-24 DIAGNOSIS — M5136 Other intervertebral disc degeneration, lumbar region: Secondary | ICD-10-CM | POA: Diagnosis not present

## 2020-07-24 DIAGNOSIS — E1169 Type 2 diabetes mellitus with other specified complication: Secondary | ICD-10-CM | POA: Diagnosis not present

## 2020-07-24 DIAGNOSIS — I152 Hypertension secondary to endocrine disorders: Secondary | ICD-10-CM | POA: Diagnosis not present

## 2020-07-24 DIAGNOSIS — G8929 Other chronic pain: Secondary | ICD-10-CM | POA: Diagnosis not present

## 2020-07-24 DIAGNOSIS — J8410 Pulmonary fibrosis, unspecified: Secondary | ICD-10-CM | POA: Diagnosis not present

## 2020-07-24 DIAGNOSIS — M48061 Spinal stenosis, lumbar region without neurogenic claudication: Secondary | ICD-10-CM | POA: Diagnosis not present

## 2020-07-24 NOTE — Telephone Encounter (Signed)
Lorraine Leonard from [facility] called to let you know while he was in patient home doing physical therapy with her she had a controlled fall with no injuries.  Patient was doing squats at kitchen counter when she fell.  Patient was able to get up with assistance and there were no injuries.

## 2020-07-25 NOTE — Telephone Encounter (Signed)
Ok

## 2020-07-26 DIAGNOSIS — E1159 Type 2 diabetes mellitus with other circulatory complications: Secondary | ICD-10-CM | POA: Diagnosis not present

## 2020-07-26 DIAGNOSIS — I5032 Chronic diastolic (congestive) heart failure: Secondary | ICD-10-CM | POA: Diagnosis not present

## 2020-07-26 DIAGNOSIS — I152 Hypertension secondary to endocrine disorders: Secondary | ICD-10-CM | POA: Diagnosis not present

## 2020-07-26 DIAGNOSIS — M48061 Spinal stenosis, lumbar region without neurogenic claudication: Secondary | ICD-10-CM | POA: Diagnosis not present

## 2020-07-26 DIAGNOSIS — E1169 Type 2 diabetes mellitus with other specified complication: Secondary | ICD-10-CM | POA: Diagnosis not present

## 2020-07-26 DIAGNOSIS — G8929 Other chronic pain: Secondary | ICD-10-CM | POA: Diagnosis not present

## 2020-07-26 DIAGNOSIS — M5136 Other intervertebral disc degeneration, lumbar region: Secondary | ICD-10-CM | POA: Diagnosis not present

## 2020-07-26 DIAGNOSIS — J8410 Pulmonary fibrosis, unspecified: Secondary | ICD-10-CM | POA: Diagnosis not present

## 2020-07-26 DIAGNOSIS — J449 Chronic obstructive pulmonary disease, unspecified: Secondary | ICD-10-CM | POA: Diagnosis not present

## 2020-07-26 DIAGNOSIS — N390 Urinary tract infection, site not specified: Secondary | ICD-10-CM | POA: Diagnosis not present

## 2020-07-26 DIAGNOSIS — N1832 Chronic kidney disease, stage 3b: Secondary | ICD-10-CM | POA: Diagnosis not present

## 2020-07-26 DIAGNOSIS — I509 Heart failure, unspecified: Secondary | ICD-10-CM | POA: Diagnosis not present

## 2020-07-26 DIAGNOSIS — M5126 Other intervertebral disc displacement, lumbar region: Secondary | ICD-10-CM | POA: Diagnosis not present

## 2020-07-26 DIAGNOSIS — I480 Paroxysmal atrial fibrillation: Secondary | ICD-10-CM | POA: Diagnosis not present

## 2020-07-27 DIAGNOSIS — G8929 Other chronic pain: Secondary | ICD-10-CM | POA: Diagnosis not present

## 2020-07-27 DIAGNOSIS — E1159 Type 2 diabetes mellitus with other circulatory complications: Secondary | ICD-10-CM | POA: Diagnosis not present

## 2020-07-27 DIAGNOSIS — I480 Paroxysmal atrial fibrillation: Secondary | ICD-10-CM | POA: Diagnosis not present

## 2020-07-27 DIAGNOSIS — N1832 Chronic kidney disease, stage 3b: Secondary | ICD-10-CM | POA: Diagnosis not present

## 2020-07-27 DIAGNOSIS — M5126 Other intervertebral disc displacement, lumbar region: Secondary | ICD-10-CM | POA: Diagnosis not present

## 2020-07-27 DIAGNOSIS — M48061 Spinal stenosis, lumbar region without neurogenic claudication: Secondary | ICD-10-CM | POA: Diagnosis not present

## 2020-07-27 DIAGNOSIS — I5032 Chronic diastolic (congestive) heart failure: Secondary | ICD-10-CM | POA: Diagnosis not present

## 2020-07-27 DIAGNOSIS — M5136 Other intervertebral disc degeneration, lumbar region: Secondary | ICD-10-CM | POA: Diagnosis not present

## 2020-07-27 DIAGNOSIS — E1169 Type 2 diabetes mellitus with other specified complication: Secondary | ICD-10-CM | POA: Diagnosis not present

## 2020-07-27 DIAGNOSIS — J8410 Pulmonary fibrosis, unspecified: Secondary | ICD-10-CM | POA: Diagnosis not present

## 2020-07-27 DIAGNOSIS — I152 Hypertension secondary to endocrine disorders: Secondary | ICD-10-CM | POA: Diagnosis not present

## 2020-07-27 DIAGNOSIS — N390 Urinary tract infection, site not specified: Secondary | ICD-10-CM | POA: Diagnosis not present

## 2020-07-27 DIAGNOSIS — J449 Chronic obstructive pulmonary disease, unspecified: Secondary | ICD-10-CM | POA: Diagnosis not present

## 2020-07-29 ENCOUNTER — Other Ambulatory Visit: Payer: Self-pay | Admitting: Family

## 2020-07-31 DIAGNOSIS — E1159 Type 2 diabetes mellitus with other circulatory complications: Secondary | ICD-10-CM | POA: Diagnosis not present

## 2020-07-31 DIAGNOSIS — E1169 Type 2 diabetes mellitus with other specified complication: Secondary | ICD-10-CM | POA: Diagnosis not present

## 2020-07-31 DIAGNOSIS — N1832 Chronic kidney disease, stage 3b: Secondary | ICD-10-CM | POA: Diagnosis not present

## 2020-07-31 DIAGNOSIS — N390 Urinary tract infection, site not specified: Secondary | ICD-10-CM | POA: Diagnosis not present

## 2020-07-31 DIAGNOSIS — M5136 Other intervertebral disc degeneration, lumbar region: Secondary | ICD-10-CM | POA: Diagnosis not present

## 2020-07-31 DIAGNOSIS — I152 Hypertension secondary to endocrine disorders: Secondary | ICD-10-CM | POA: Diagnosis not present

## 2020-07-31 DIAGNOSIS — M5126 Other intervertebral disc displacement, lumbar region: Secondary | ICD-10-CM | POA: Diagnosis not present

## 2020-07-31 DIAGNOSIS — M48061 Spinal stenosis, lumbar region without neurogenic claudication: Secondary | ICD-10-CM | POA: Diagnosis not present

## 2020-07-31 DIAGNOSIS — J8410 Pulmonary fibrosis, unspecified: Secondary | ICD-10-CM | POA: Diagnosis not present

## 2020-07-31 DIAGNOSIS — J449 Chronic obstructive pulmonary disease, unspecified: Secondary | ICD-10-CM | POA: Diagnosis not present

## 2020-07-31 DIAGNOSIS — G8929 Other chronic pain: Secondary | ICD-10-CM | POA: Diagnosis not present

## 2020-07-31 DIAGNOSIS — I5032 Chronic diastolic (congestive) heart failure: Secondary | ICD-10-CM | POA: Diagnosis not present

## 2020-07-31 DIAGNOSIS — I480 Paroxysmal atrial fibrillation: Secondary | ICD-10-CM | POA: Diagnosis not present

## 2020-08-01 DIAGNOSIS — M5136 Other intervertebral disc degeneration, lumbar region: Secondary | ICD-10-CM | POA: Diagnosis not present

## 2020-08-01 DIAGNOSIS — I480 Paroxysmal atrial fibrillation: Secondary | ICD-10-CM | POA: Diagnosis not present

## 2020-08-01 DIAGNOSIS — N390 Urinary tract infection, site not specified: Secondary | ICD-10-CM | POA: Diagnosis not present

## 2020-08-01 DIAGNOSIS — G8929 Other chronic pain: Secondary | ICD-10-CM | POA: Diagnosis not present

## 2020-08-01 DIAGNOSIS — M5126 Other intervertebral disc displacement, lumbar region: Secondary | ICD-10-CM | POA: Diagnosis not present

## 2020-08-01 DIAGNOSIS — N1832 Chronic kidney disease, stage 3b: Secondary | ICD-10-CM | POA: Diagnosis not present

## 2020-08-01 DIAGNOSIS — I5032 Chronic diastolic (congestive) heart failure: Secondary | ICD-10-CM | POA: Diagnosis not present

## 2020-08-01 DIAGNOSIS — J449 Chronic obstructive pulmonary disease, unspecified: Secondary | ICD-10-CM | POA: Diagnosis not present

## 2020-08-01 DIAGNOSIS — E1169 Type 2 diabetes mellitus with other specified complication: Secondary | ICD-10-CM | POA: Diagnosis not present

## 2020-08-01 DIAGNOSIS — J8410 Pulmonary fibrosis, unspecified: Secondary | ICD-10-CM | POA: Diagnosis not present

## 2020-08-01 DIAGNOSIS — M48061 Spinal stenosis, lumbar region without neurogenic claudication: Secondary | ICD-10-CM | POA: Diagnosis not present

## 2020-08-01 DIAGNOSIS — E1159 Type 2 diabetes mellitus with other circulatory complications: Secondary | ICD-10-CM | POA: Diagnosis not present

## 2020-08-01 DIAGNOSIS — I152 Hypertension secondary to endocrine disorders: Secondary | ICD-10-CM | POA: Diagnosis not present

## 2020-08-07 DIAGNOSIS — E1159 Type 2 diabetes mellitus with other circulatory complications: Secondary | ICD-10-CM | POA: Diagnosis not present

## 2020-08-07 DIAGNOSIS — G8929 Other chronic pain: Secondary | ICD-10-CM | POA: Diagnosis not present

## 2020-08-07 DIAGNOSIS — J8410 Pulmonary fibrosis, unspecified: Secondary | ICD-10-CM | POA: Diagnosis not present

## 2020-08-07 DIAGNOSIS — N390 Urinary tract infection, site not specified: Secondary | ICD-10-CM | POA: Diagnosis not present

## 2020-08-07 DIAGNOSIS — I480 Paroxysmal atrial fibrillation: Secondary | ICD-10-CM | POA: Diagnosis not present

## 2020-08-07 DIAGNOSIS — N1832 Chronic kidney disease, stage 3b: Secondary | ICD-10-CM | POA: Diagnosis not present

## 2020-08-07 DIAGNOSIS — M5126 Other intervertebral disc displacement, lumbar region: Secondary | ICD-10-CM | POA: Diagnosis not present

## 2020-08-07 DIAGNOSIS — M48061 Spinal stenosis, lumbar region without neurogenic claudication: Secondary | ICD-10-CM | POA: Diagnosis not present

## 2020-08-07 DIAGNOSIS — I5032 Chronic diastolic (congestive) heart failure: Secondary | ICD-10-CM | POA: Diagnosis not present

## 2020-08-07 DIAGNOSIS — M5136 Other intervertebral disc degeneration, lumbar region: Secondary | ICD-10-CM | POA: Diagnosis not present

## 2020-08-07 DIAGNOSIS — I152 Hypertension secondary to endocrine disorders: Secondary | ICD-10-CM | POA: Diagnosis not present

## 2020-08-07 DIAGNOSIS — J449 Chronic obstructive pulmonary disease, unspecified: Secondary | ICD-10-CM | POA: Diagnosis not present

## 2020-08-07 DIAGNOSIS — E1169 Type 2 diabetes mellitus with other specified complication: Secondary | ICD-10-CM | POA: Diagnosis not present

## 2020-08-09 ENCOUNTER — Encounter: Payer: Self-pay | Admitting: Family

## 2020-08-09 ENCOUNTER — Ambulatory Visit (INDEPENDENT_AMBULATORY_CARE_PROVIDER_SITE_OTHER): Payer: Medicare Other | Admitting: Family

## 2020-08-09 ENCOUNTER — Other Ambulatory Visit: Payer: Self-pay

## 2020-08-09 VITALS — BP 132/66 | HR 63 | Temp 98.3°F | Ht 67.0 in | Wt 204.0 lb

## 2020-08-09 DIAGNOSIS — E559 Vitamin D deficiency, unspecified: Secondary | ICD-10-CM

## 2020-08-09 DIAGNOSIS — E1169 Type 2 diabetes mellitus with other specified complication: Secondary | ICD-10-CM | POA: Diagnosis not present

## 2020-08-09 DIAGNOSIS — J8410 Pulmonary fibrosis, unspecified: Secondary | ICD-10-CM | POA: Diagnosis not present

## 2020-08-09 DIAGNOSIS — E1142 Type 2 diabetes mellitus with diabetic polyneuropathy: Secondary | ICD-10-CM | POA: Diagnosis not present

## 2020-08-09 DIAGNOSIS — I5031 Acute diastolic (congestive) heart failure: Secondary | ICD-10-CM

## 2020-08-09 DIAGNOSIS — F331 Major depressive disorder, recurrent, moderate: Secondary | ICD-10-CM

## 2020-08-09 DIAGNOSIS — I5032 Chronic diastolic (congestive) heart failure: Secondary | ICD-10-CM | POA: Diagnosis not present

## 2020-08-09 DIAGNOSIS — K219 Gastro-esophageal reflux disease without esophagitis: Secondary | ICD-10-CM | POA: Diagnosis not present

## 2020-08-09 DIAGNOSIS — J449 Chronic obstructive pulmonary disease, unspecified: Secondary | ICD-10-CM | POA: Diagnosis not present

## 2020-08-09 DIAGNOSIS — E785 Hyperlipidemia, unspecified: Secondary | ICD-10-CM

## 2020-08-09 DIAGNOSIS — E1159 Type 2 diabetes mellitus with other circulatory complications: Secondary | ICD-10-CM | POA: Diagnosis not present

## 2020-08-09 DIAGNOSIS — E039 Hypothyroidism, unspecified: Secondary | ICD-10-CM

## 2020-08-09 DIAGNOSIS — I152 Hypertension secondary to endocrine disorders: Secondary | ICD-10-CM

## 2020-08-09 DIAGNOSIS — N183 Chronic kidney disease, stage 3 unspecified: Secondary | ICD-10-CM | POA: Diagnosis not present

## 2020-08-09 DIAGNOSIS — F419 Anxiety disorder, unspecified: Secondary | ICD-10-CM

## 2020-08-09 MED ORDER — VITAMIN D (ERGOCALCIFEROL) 1.25 MG (50000 UNIT) PO CAPS: 50000.0000 [IU] | ORAL_CAPSULE | ORAL | 1 refills | Status: DC

## 2020-08-09 NOTE — Progress Notes (Signed)
Subjective:    Patient ID: Lorraine Leonard, female    DOB: 04/25/1940, 80 y.o.   MRN: [id number]   Chief Complaint  Patient presents with  . Fall    Fell 3 times within 2-2 1/2 weeks   Pt presents to the office today with weakness and fatigue. She reports she has fallen 3 in the last 2 1/2 weeks. She has noticed weakness on her left foot.   She is currently doing PT twice a week.   She is followed by Cardiologists every 6 months for CHF &A Fib. She is followed by Pulmonologist6 monthsfor pulmonary hypertension and Pulmonary Fibrosis. She was told to decrease her oxygen to as needed. Pt is not on oxygen today.  She reports her breathing is stable, but SOB with exertion. She is followed by Nephrologists every 6 months.  Fall  Diabetes She presents for her follow-up diabetic visit. She has type 2 diabetes mellitus. Hypoglycemia symptoms include nervousness/anxiousness. Pertinent negatives for hypoglycemia include no confusion. Associated symptoms include fatigue and foot paresthesias. Pertinent negatives for diabetes include no blurred vision. Pertinent negatives for hypoglycemia complications include no blackouts. Symptoms are stable. Diabetic complications include heart disease, nephropathy and peripheral neuropathy. Risk factors for coronary artery disease include dyslipidemia, diabetes mellitus, hypertension, sedentary lifestyle and post-menopausal. She is following a generally unhealthy diet. Her overall blood glucose range is 130-140 mg/dl.  Hypertension This is a chronic problem. The current episode started more than 1 year ago. The problem has been resolved since onset. The problem is controlled. Associated symptoms include anxiety, malaise/fatigue and shortness of breath. Pertinent negatives include no blurred vision or peripheral edema. Risk factors for coronary artery disease include dyslipidemia, diabetes mellitus and sedentary lifestyle. The current treatment provides moderate  improvement. Identifiable causes of hypertension include a thyroid problem.  Congestive Heart Failure Presents for follow-up visit. Associated symptoms include edema, fatigue and shortness of breath. The symptoms have been stable.  Hyperlipidemia This is a chronic problem. The current episode started more than 1 year ago. The problem is controlled. Recent lipid tests were reviewed and are normal. Exacerbating diseases include obesity. Associated symptoms include shortness of breath. Current antihyperlipidemic treatment includes statins. The current treatment provides moderate improvement of lipids. Risk factors for coronary artery disease include dyslipidemia, diabetes mellitus, hypertension, a sedentary lifestyle and post-menopausal.  Depression        This is a chronic problem.  The current episode started more than 1 year ago.   The problem occurs intermittently.  The problem has been waxing and waning since onset.  Associated symptoms include fatigue, helplessness, hopelessness, irritable, restlessness and sad.  Past medical history includes thyroid problem and anxiety.   Gastroesophageal Reflux She complains of belching and heartburn. This is a chronic problem. The current episode started more than 1 year ago. The problem occurs occasionally. Associated symptoms include fatigue. She has tried a PPI for the symptoms. The treatment provided moderate relief.  Thyroid Problem Presents for follow-up visit. Symptoms include anxiety and fatigue. Patient reports no constipation or diarrhea. The symptoms have been stable. Her past medical history is significant for hyperlipidemia.  Arthritis Presents for follow-up visit. She complains of pain and stiffness. The symptoms have been stable. Affected locations include the right knee, left knee, right foot, left foot, right MCP, left MCP, left shoulder and right shoulder. Her pain is at a severity of 4/10. Associated symptoms include fatigue. Pertinent negatives  include no diarrhea.  Anxiety Presents for follow-up visit. Symptoms include  excessive worry, irritability, nervous/anxious behavior, restlessness and shortness of breath. Patient reports no confusion. Symptoms occur most days. The severity of symptoms is moderate. The quality of sleep is good.        Review of Systems  Constitutional: Positive for fatigue, irritability and malaise/fatigue.  Eyes: Negative for blurred vision.  Respiratory: Positive for shortness of breath.   Gastrointestinal: Positive for heartburn. Negative for constipation and diarrhea.  Musculoskeletal: Positive for arthritis and stiffness.  Psychiatric/Behavioral: Positive for depression. Negative for confusion. The patient is nervous/anxious.        Objective:   Physical Exam Vitals reviewed.  Constitutional:      General: She is irritable. She is not in acute distress.    Appearance: She is well-developed.  HENT:     Head: Normocephalic and atraumatic.     Right Ear: Tympanic membrane normal.     Left Ear: Tympanic membrane normal.  Eyes:     Pupils: Pupils are equal, round, and reactive to light.  Neck:     Thyroid: No thyromegaly.  Cardiovascular:     Rate and Rhythm: Normal rate and regular rhythm.     Heart sounds: Normal heart sounds. No murmur heard.   Pulmonary:     Effort: Pulmonary effort is normal. No respiratory distress.     Breath sounds: Normal breath sounds. No wheezing.     Comments: SOB with exertion Abdominal:     General: Bowel sounds are normal. There is no distension.     Palpations: Abdomen is soft.     Tenderness: There is no abdominal tenderness.  Musculoskeletal:        General: No tenderness. Normal range of motion.     Cervical back: Normal range of motion and neck supple.  Skin:    General: Skin is warm and dry.  Neurological:     Mental Status: She is alert and oriented to person, place, and time.     Cranial Nerves: No cranial nerve deficit.     Motor: Weakness  present.     Gait: Gait abnormal.     Deep Tendon Reflexes: Reflexes are normal and symmetric.     Comments: Weakness in left foot with dorsiflexion  Psychiatric:        Behavior: Behavior normal.        Thought Content: Thought content normal.        Judgment: Judgment normal.          BP 132/66   Pulse 63   Temp 98.3 F (36.8 C)   Ht $R'5\' 7"'HZ$  (1.702 m)   Wt 204 lb (92.5 kg)   SpO2 94%   BMI 31.95 kg/m   Assessment & Plan:  LELER BRION comes in today with chief complaint of Fall [place] [address] 3 times within 2-2 1/2 weeks)   Diagnosis and orders addressed:  1. Acute diastolic CHF (congestive heart failure) (HCC) - CMP14+EGFR - CBC with Differential/Platelet  2. Chronic diastolic heart failure (HCC) - CMP14+EGFR - CBC with Differential/Platelet  3. Hypertension associated with diabetes ([place])  - CMP14+EGFR - CBC with Differential/Platelet  4. Chronic obstructive pulmonary disease, unspecified COPD type ([place]) - CMP14+EGFR - CBC with Differential/Platelet  5. Pulmonary fibrosis (HCC) - CMP14+EGFR - CBC with Differential/Platelet  6. Gastroesophageal reflux disease, unspecified whether esophagitis present - CMP14+EGFR - CBC with Differential/Platelet  7. Hyperlipidemia associated with type 2 diabetes mellitus (HCC) - CMP14+EGFR - CBC with Differential/Platelet  8. Hypothyroidism, unspecified type - CMP14+EGFR - CBC with Differential/Platelet -  TSH  9. Type 2 diabetes mellitus with diabetic polyneuropathy, without long-term current use of insulin (HCC) - CMP14+EGFR - CBC with Differential/Platelet  10. Stage 3 chronic kidney disease, unspecified whether stage 3a or 3b CKD (HCC) - CMP14+EGFR - CBC with Differential/Platelet  11. Anxiety - CMP14+EGFR - CBC with Differential/Platelet  12. Moderate episode of recurrent major depressive disorder (HCC) - CMP14+EGFR - CBC with Differential/Platelet  13. Vitamin D deficiency - CMP14+EGFR - CBC with  Differential/Platelet - TSH - VITAMIN D 25 Hydroxy (Vit-D Deficiency, Fractures) - Vitamin D, Ergocalciferol, (DRISDOL) 1.25 MG (50000 UNIT) CAPS capsule; Take 1 capsule (50,000 Units total) by mouth every 7 (seven) days.  Dispense: 12 capsule; Refill: 1   Labs pending Health Maintenance reviewed Diet and exercise encouraged  Follow up plan: 3 months    Evelina Dun, FNP

## 2020-08-09 NOTE — Patient Instructions (Signed)

## 2020-08-10 ENCOUNTER — Other Ambulatory Visit: Payer: Self-pay | Admitting: Family

## 2020-08-10 LAB — CMP14+EGFR
ALT: 17 IU/L (ref 0–32)
AST: 20 IU/L (ref 0–40)
Albumin/Globulin Ratio: 1.3 (ref 1.2–2.2)
Albumin: 4.3 g/dL (ref 3.7–4.7)
Alkaline Phosphatase: 83 IU/L (ref 44–121)
BUN/Creatinine Ratio: 14 (ref 12–28)
BUN: 17 mg/dL (ref 8–27)
Bilirubin Total: 0.3 mg/dL (ref 0.0–1.2)
CO2: 25 mmol/L (ref 20–29)
Calcium: 10.1 mg/dL (ref 8.7–10.3)
Chloride: 100 mmol/L (ref 96–106)
Creatinine, Ser: 1.21 mg/dL — ABNORMAL HIGH (ref 0.57–1.00)
Globulin, Total: 3.4 g/dL (ref 1.5–4.5)
Glucose: 170 mg/dL — ABNORMAL HIGH (ref 65–99)
Potassium: 5.3 mmol/L — ABNORMAL HIGH (ref 3.5–5.2)
Sodium: 142 mmol/L (ref 134–144)
Total Protein: 7.7 g/dL (ref 6.0–8.5)
eGFR: 45 mL/min/{1.73_m2} — ABNORMAL LOW (ref >59)

## 2020-08-10 LAB — CBC WITH DIFFERENTIAL/PLATELET
Basophils Absolute: 0.1 10*3/uL (ref 0.0–0.2)
Basos: 1 %
EOS (ABSOLUTE): 0.1 10*3/uL (ref 0.0–0.4)
Eos: 1 %
Hematocrit: 40.6 % (ref 34.0–46.6)
Hemoglobin: 12.5 g/dL (ref 11.1–15.9)
Immature Grans (Abs): 0 10*3/uL (ref 0.0–0.1)
Immature Granulocytes: 0 %
Lymphocytes Absolute: 2 10*3/uL (ref 0.7–3.1)
Lymphs: 19 %
MCH: 27 pg (ref 26.6–33.0)
MCHC: 30.8 g/dL — ABNORMAL LOW (ref 31.5–35.7)
MCV: 88 fL (ref 79–97)
Monocytes Absolute: 0.6 10*3/uL (ref 0.1–0.9)
Monocytes: 6 %
Neutrophils Absolute: 7.5 10*3/uL — ABNORMAL HIGH (ref 1.4–7.0)
Neutrophils: 73 %
Platelets: 272 10*3/uL (ref 150–450)
RBC: 4.63 x10E6/uL (ref 3.77–5.28)
RDW: 13.7 % (ref 11.7–15.4)
WBC: 10.3 10*3/uL (ref 3.4–10.8)

## 2020-08-10 LAB — VITAMIN D 25 HYDROXY (VIT D DEFICIENCY, FRACTURES): Vit D, 25-Hydroxy: 43.5 ng/mL (ref 30.0–100.0)

## 2020-08-10 LAB — TSH: TSH: 0.207 u[IU]/mL — ABNORMAL LOW (ref 0.450–4.500)

## 2020-08-10 MED ORDER — LEVOTHYROXINE SODIUM 112 MCG PO TABS: 112.0000 ug | ORAL_TABLET | Freq: Every day | ORAL | 1 refills | Status: DC

## 2020-08-13 DIAGNOSIS — I152 Hypertension secondary to endocrine disorders: Secondary | ICD-10-CM | POA: Diagnosis not present

## 2020-08-13 DIAGNOSIS — M5136 Other intervertebral disc degeneration, lumbar region: Secondary | ICD-10-CM | POA: Diagnosis not present

## 2020-08-13 DIAGNOSIS — E1159 Type 2 diabetes mellitus with other circulatory complications: Secondary | ICD-10-CM | POA: Diagnosis not present

## 2020-08-13 DIAGNOSIS — M5126 Other intervertebral disc displacement, lumbar region: Secondary | ICD-10-CM | POA: Diagnosis not present

## 2020-08-13 DIAGNOSIS — N390 Urinary tract infection, site not specified: Secondary | ICD-10-CM | POA: Diagnosis not present

## 2020-08-13 DIAGNOSIS — J8410 Pulmonary fibrosis, unspecified: Secondary | ICD-10-CM | POA: Diagnosis not present

## 2020-08-13 DIAGNOSIS — I5032 Chronic diastolic (congestive) heart failure: Secondary | ICD-10-CM | POA: Diagnosis not present

## 2020-08-13 DIAGNOSIS — G8929 Other chronic pain: Secondary | ICD-10-CM | POA: Diagnosis not present

## 2020-08-13 DIAGNOSIS — E1169 Type 2 diabetes mellitus with other specified complication: Secondary | ICD-10-CM | POA: Diagnosis not present

## 2020-08-13 DIAGNOSIS — M48061 Spinal stenosis, lumbar region without neurogenic claudication: Secondary | ICD-10-CM | POA: Diagnosis not present

## 2020-08-13 DIAGNOSIS — J449 Chronic obstructive pulmonary disease, unspecified: Secondary | ICD-10-CM | POA: Diagnosis not present

## 2020-08-13 DIAGNOSIS — N1832 Chronic kidney disease, stage 3b: Secondary | ICD-10-CM | POA: Diagnosis not present

## 2020-08-13 DIAGNOSIS — I480 Paroxysmal atrial fibrillation: Secondary | ICD-10-CM | POA: Diagnosis not present

## 2020-08-13 LAB — SPECIMEN STATUS REPORT

## 2020-08-13 LAB — HGB A1C W/O EAG: Hgb A1c MFr Bld: 6.4 % — ABNORMAL HIGH (ref 4.8–5.6)

## 2020-08-14 ENCOUNTER — Other Ambulatory Visit: Payer: Self-pay | Admitting: Family

## 2020-08-14 DIAGNOSIS — R7303 Prediabetes: Secondary | ICD-10-CM | POA: Insufficient documentation

## 2020-08-15 ENCOUNTER — Other Ambulatory Visit: Payer: Self-pay | Admitting: Family

## 2020-08-21 ENCOUNTER — Ambulatory Visit: Payer: Medicare Other | Admitting: Cardiology

## 2020-08-22 DIAGNOSIS — I5032 Chronic diastolic (congestive) heart failure: Secondary | ICD-10-CM | POA: Diagnosis not present

## 2020-08-22 DIAGNOSIS — M5136 Other intervertebral disc degeneration, lumbar region: Secondary | ICD-10-CM | POA: Diagnosis not present

## 2020-08-22 DIAGNOSIS — G8929 Other chronic pain: Secondary | ICD-10-CM | POA: Diagnosis not present

## 2020-08-22 DIAGNOSIS — E1159 Type 2 diabetes mellitus with other circulatory complications: Secondary | ICD-10-CM | POA: Diagnosis not present

## 2020-08-22 DIAGNOSIS — J8410 Pulmonary fibrosis, unspecified: Secondary | ICD-10-CM | POA: Diagnosis not present

## 2020-08-22 DIAGNOSIS — M5126 Other intervertebral disc displacement, lumbar region: Secondary | ICD-10-CM | POA: Diagnosis not present

## 2020-08-22 DIAGNOSIS — E1169 Type 2 diabetes mellitus with other specified complication: Secondary | ICD-10-CM | POA: Diagnosis not present

## 2020-08-22 DIAGNOSIS — J449 Chronic obstructive pulmonary disease, unspecified: Secondary | ICD-10-CM | POA: Diagnosis not present

## 2020-08-22 DIAGNOSIS — M48061 Spinal stenosis, lumbar region without neurogenic claudication: Secondary | ICD-10-CM | POA: Diagnosis not present

## 2020-08-22 DIAGNOSIS — I152 Hypertension secondary to endocrine disorders: Secondary | ICD-10-CM | POA: Diagnosis not present

## 2020-08-22 DIAGNOSIS — N390 Urinary tract infection, site not specified: Secondary | ICD-10-CM | POA: Diagnosis not present

## 2020-08-22 DIAGNOSIS — I480 Paroxysmal atrial fibrillation: Secondary | ICD-10-CM | POA: Diagnosis not present

## 2020-08-22 DIAGNOSIS — N1832 Chronic kidney disease, stage 3b: Secondary | ICD-10-CM | POA: Diagnosis not present

## 2020-08-23 ENCOUNTER — Ambulatory Visit: Payer: Medicare Other | Admitting: Family

## 2020-08-25 DIAGNOSIS — I509 Heart failure, unspecified: Secondary | ICD-10-CM | POA: Diagnosis not present

## 2020-08-25 DIAGNOSIS — J449 Chronic obstructive pulmonary disease, unspecified: Secondary | ICD-10-CM | POA: Diagnosis not present

## 2020-08-25 DIAGNOSIS — J8410 Pulmonary fibrosis, unspecified: Secondary | ICD-10-CM | POA: Diagnosis not present

## 2020-08-28 ENCOUNTER — Ambulatory Visit: Payer: Medicare Other | Admitting: Urology

## 2020-09-04 ENCOUNTER — Ambulatory Visit (INDEPENDENT_AMBULATORY_CARE_PROVIDER_SITE_OTHER): Payer: Medicare Other

## 2020-09-04 DIAGNOSIS — I442 Atrioventricular block, complete: Secondary | ICD-10-CM | POA: Diagnosis not present

## 2020-09-04 LAB — CUP PACEART REMOTE DEVICE CHECK
Battery Remaining Longevity: 59 mo
Battery Voltage: 2.88 V
Brady Statistic AP VP Percent: 0.05 %
Brady Statistic AP VS Percent: 99.92 %
Brady Statistic AS VP Percent: 0 %
Brady Statistic AS VS Percent: 0.03 %
Brady Statistic RA Percent Paced: 99.97 %
Brady Statistic RV Percent Paced: 0.05 %
Date Time Interrogation Session: 20220516203016
Implantable Lead Implant Date: 20190128
Implantable Lead Implant Date: 20190128
Implantable Lead Location: 753860
Implantable Lead Location: 753860
Implantable Lead Model: 3830
Implantable Lead Model: 5076
Implantable Pulse Generator Implant Date: 20190128
Lead Channel Impedance Value: 285 Ohm
Lead Channel Impedance Value: 342 Ohm
Lead Channel Impedance Value: 361 Ohm
Lead Channel Impedance Value: 418 Ohm
Lead Channel Sensing Intrinsic Amplitude: 10 mV
Lead Channel Sensing Intrinsic Amplitude: 10 mV
Lead Channel Sensing Intrinsic Amplitude: 3.625 mV
Lead Channel Sensing Intrinsic Amplitude: 3.625 mV
Lead Channel Setting Pacing Amplitude: 2.5 V
Lead Channel Setting Pacing Amplitude: 3.5 V
Lead Channel Setting Pacing Pulse Width: 0.4 ms
Lead Channel Setting Sensing Sensitivity: 0.9 mV

## 2020-09-09 ENCOUNTER — Other Ambulatory Visit: Payer: Self-pay | Admitting: Family

## 2020-09-09 DIAGNOSIS — K219 Gastro-esophageal reflux disease without esophagitis: Secondary | ICD-10-CM

## 2020-09-18 ENCOUNTER — Other Ambulatory Visit: Payer: Self-pay | Admitting: Family

## 2020-09-18 DIAGNOSIS — F419 Anxiety disorder, unspecified: Secondary | ICD-10-CM

## 2020-09-19 ENCOUNTER — Other Ambulatory Visit: Payer: Self-pay | Admitting: Family

## 2020-09-25 DIAGNOSIS — J8410 Pulmonary fibrosis, unspecified: Secondary | ICD-10-CM | POA: Diagnosis not present

## 2020-09-25 DIAGNOSIS — J449 Chronic obstructive pulmonary disease, unspecified: Secondary | ICD-10-CM | POA: Diagnosis not present

## 2020-09-25 DIAGNOSIS — I509 Heart failure, unspecified: Secondary | ICD-10-CM | POA: Diagnosis not present

## 2020-09-26 NOTE — Progress Notes (Signed)
Remote pacemaker transmission.   

## 2020-10-02 ENCOUNTER — Encounter: Payer: Self-pay | Admitting: Cardiology

## 2020-10-02 ENCOUNTER — Other Ambulatory Visit: Payer: Self-pay

## 2020-10-02 ENCOUNTER — Ambulatory Visit (INDEPENDENT_AMBULATORY_CARE_PROVIDER_SITE_OTHER): Payer: Medicare Other | Admitting: Cardiology

## 2020-10-02 VITALS — BP 142/58 | HR 84 | Ht 67.0 in | Wt 206.0 lb

## 2020-10-02 DIAGNOSIS — I5032 Chronic diastolic (congestive) heart failure: Secondary | ICD-10-CM | POA: Diagnosis not present

## 2020-10-02 DIAGNOSIS — I4891 Unspecified atrial fibrillation: Secondary | ICD-10-CM

## 2020-10-02 NOTE — Progress Notes (Signed)
Clinical Summary Ms. Hoppes is a 80 y.o.femaleseen today for follow up of the following medical problems.    1. Afib - followed by EP. Failed rate control due to bradycardia. Unable to maintain rhythm control - she is s/p AV node ablation with pacemaker placement by EP. 11/2019 normal device check      - occaisonal palpitations with walking.  - no bleeding on eliquis.       2. Chronic diastolic HF - 50/9326 echo LVEF 55-60%, no WMAs- - not checking home weights. By clinic weights up 8 lbs since last month. Has had some increased LE edema, some increased DOE     Home scale 222 lbs, down from prior visit  - no recent edema - takes lasix 40mg  once daily, she stopped 2nd dose.    - no recent edema    3. Pulmonary fibrosis - followed by pulmionary - on home O2   Past Medical History:  Diagnosis Date   Acute diastolic CHF (congestive heart failure) (HCC) 11/11/2016   Allergic rhinitis    Anxiety    Anxiety    Arthritis    "~ all my joints" (05/18/2017)   Atrial fibrillation ([place]) 09/2016   Atrial fibrillation with RVR ([place]) 05/18/2017   Chronic lower back pain    COPD (chronic obstructive pulmonary disease) (HCC)    Depression    Diverticulosis    Dyspnea    Dysrhythmia    GERD (gastroesophageal reflux disease)    Glaucoma    Gout    Heart disease    History of blood transfusion 1983   "when I gave a 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"   Hyperlipidemia    Hypertension    Hypothyroidism    Macular degeneration of both eyes    Neuropathy    On home oxygen therapy    "3L; 24/7" (05/18/2017)   Pre-diabetes    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 90 tablet 1   albuterol (PROVENTIL) (2.5 MG/3ML) 0.083% nebulizer solution Take 3 mLs (2.5 mg total) by nebulization every 6 (six) hours as needed for wheezing or shortness of breath. J96.01 150 mL 1   albuterol (VENTOLIN HFA) 108 (90 Base) MCG/ACT inhaler TAKE 2 PUFFS BY MOUTH EVERY 6 HOURS AS NEEDED FOR WHEEZE OR SHORTNESS OF BREATH 8.5 each 0   atenolol (TENORMIN) 25 MG tablet Take 1 tablet (25 mg total) by mouth daily. 30 tablet 6   calcium citrate (CALCITRATE - DOSED IN MG ELEMENTAL CALCIUM) 950 MG tablet Take 1 tablet by mouth 2 (two) times daily.      cyanocobalamin 1000 MCG tablet Take 1,000 mcg by mouth daily. Vitamin b12 every AM     diclofenac Sodium (VOLTAREN) 1 % GEL Apply 4 g topically 4 (four) times daily.     ELIQUIS 5 MG TABS tablet TAKE 1 TABLET BY MOUTH TWICE A DAY 180 tablet 0   EPINEPHrine 0.3  mg/0.3 mL IJ SOAJ injection Inject 0.3 mg into the muscle as needed for anaphylaxis.     fluticasone (FLONASE) 50 MCG/ACT nasal spray PLACE 1 SPRAY INTO BOTH NOSTRILS 2 (TWO) TIMES DAILY AS NEEDED FOR ALLERGIES OR RHINITIS. 48 mL 1   furosemide (LASIX) 40 MG tablet Take 1 tablet (40 mg total) by mouth 2 (two) times daily. 180 tablet 3   levocetirizine (XYZAL) 5 MG tablet TAKE 1 TABLET BY MOUTH EVERY DAY IN THE EVENING (Patient taking differently: Take 5 mg by mouth every evening.) 90 tablet 1   metFORMIN (GLUCOPHAGE-XR) 500 MG 24 hr tablet TAKE 1 TABLET BY MOUTH EVERY DAY WITH BREAKFAST (Patient taking differently: Take 500 mg by mouth daily with breakfast.) 90 tablet 0   Multiple Vitamins-Minerals (PRESERVISION AREDS 2) CAPS Take 1 capsule by mouth 2 (two) times daily.     Olopatadine HCl 0.2 % SOLN INSTILL 1 DROP INTO BOTH EYES EVERY DAY 10 mL 5   omeprazole (PRILOSEC) 20 MG capsule TAKE 1 CAPSULE BY MOUTH EVERY DAY 90 capsule 0    OXYGEN Inhale 3 L into the lungs every evening.     Polyethyl Glycol-Propyl Glycol (SYSTANE) 0.4-0.3 % GEL ophthalmic gel Place 1 application into both eyes at bedtime as needed (dry eyes).     predniSONE (DELTASONE) 5 MG tablet TAKE 1 TABLET BY MOUTH EVERY DAY WITH BREAKFAST (Patient taking differently: Take 5 mg by mouth daily with breakfast.) 90 tablet 2   rosuvastatin (CRESTOR) 10 MG tablet TAKE 1 TABLET BY MOUTH EVERYDAY AT BEDTIME 90 tablet 0   sertraline (ZOLOFT) 50 MG tablet TAKE 1 TABLET BY MOUTH EVERY DAY 90 tablet 1   spironolactone (ALDACTONE) 25 MG tablet TAKE 1 TABLET BY MOUTH EVERY DAY 90 tablet 0   vitamin C (ASCORBIC ACID) 500 MG tablet Take 1,000 mg by mouth daily. AM     Vitamin D, Ergocalciferol, (DRISDOL) 1.25 MG (50000 UNIT) CAPS capsule Take 1 capsule (50,000 Units total) by mouth every 7 (seven) days. 12 capsule 1   No current facility-administered medications for this visit.     Past Surgical History:  Procedure Laterality Date   AV NODE ABLATION  05/18/2017   AV NODE ABLATION N/A 05/18/2017   Procedure: AV NODE ABLATION;  Surgeon: Evans Lance, MD;  Location: [facility];  Service: Cardiovascular;  Laterality: N/A;   BACK SURGERY     BREAST LUMPECTOMY Left    CARDIOVERSION N/A 01/30/2017   Procedure: CARDIOVERSION;  Surgeon: Josue Hector, MD;  [facility];  Service: Cardiovascular;  Laterality: N/A;   CARDIOVERSION N/A 04/09/2017   Procedure: CARDIOVERSION;  Surgeon: Satira Sark, MD;  [facility];  Service: Cardiovascular;  Laterality: N/A;   CATARACT EXTRACTION W/ INTRAOCULAR LENS  IMPLANT, BILATERAL Bilateral    DILATION AND CURETTAGE OF UTERUS     EUS N/A 10/03/2015   Procedure: UPPER ENDOSCOPIC ULTRASOUND (EUS) RADIAL;  Surgeon: Arta Silence, MD;  Location: [facility];  Service: Endoscopy;  Laterality: N/A;   FINE NEEDLE ASPIRATION N/A 10/03/2015   Procedure: FINE NEEDLE ASPIRATION (FNA) RADIAL;  Surgeon:  Arta Silence, MD;  Location: [facility];  Service: Endoscopy;  Laterality: N/A;   INSERT / REPLACE / REMOVE PACEMAKER  05/18/2017   LUMBAR LAMINECTOMY  1995; 06/23/2008   L4-5/notes 08/20/2010   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   NEPHRECTOMY TRANSPLANTED ORGAN     PACEMAKER IMPLANT N/A 05/18/2017  Procedure: PACEMAKER IMPLANT;  Surgeon: Evans Lance, MD;  Location: [facility];  Service: Cardiovascular;  Laterality: N/A;   TOTAL ABDOMINAL HYSTERECTOMY     TUBAL LIGATION       Allergies  Allergen Reactions   Allopurinol Swelling    Feet and legs swell   Mobic [Meloxicam] Swelling and Other (See Comments)    Feet and Leg swelling      Family History  Problem Relation Age of Onset   Diabetes Other    Hypertension Other    Coronary artery disease Other    Stroke Other    Asthma Other    Kidney disease Sister    Cancer Sister        lung   Lung cancer Sister    Cancer Sister        lung   Lung cancer Sister    Cancer Sister        lung   Suicidality Son        committed suicide in 2009   CVA Son    Hyperlipidemia Other    Anxiety disorder Other    Migraines Other    Heart failure Father    Healthy Brother    Cancer Sister        lung   Healthy Sister      Social History Ms. Balogh reports that she has never smoked. She has never used smokeless tobacco. Ms. Laufer reports no history of alcohol use.   Review of Systems CONSTITUTIONAL: No weight loss, fever, chills, weakness or fatigue.  HEENT: Eyes: No visual loss, blurred vision, double vision or yellow sclerae.No hearing loss, sneezing, congestion, runny nose or sore throat.  SKIN: No rash or itching.  CARDIOVASCULAR: per hpi RESPIRATORY: No shortness of breath, cough or sputum.  GASTROINTESTINAL: No anorexia, nausea, vomiting or diarrhea. No abdominal pain or blood.  GENITOURINARY: No burning on urination, no polyuria NEUROLOGICAL: No headache, dizziness, syncope,  paralysis, ataxia, numbness or tingling in the extremities. No change in bowel or bladder control.  MUSCULOSKELETAL: No muscle, back pain, joint pain or stiffness.  LYMPHATICS: No enlarged nodes. No history of splenectomy.  PSYCHIATRIC: No history of depression or anxiety.  ENDOCRINOLOGIC: No reports of sweating, cold or heat intolerance. No polyuria or polydipsia.  Marland Kitchen   Physical Examination Today's Vitals   10/02/20 1448  BP: (!) 142/58  Pulse: 84  SpO2: 96%  Weight: 206 lb (93.4 kg)  Height: 5\' 7"  (1.702 m)   Body mass index is 32.26 kg/m.  Gen: resting comfortably, no acute distress HEENT: no scleral icterus, pupils equal round and reactive, no palptable cervical adenopathy,  CV:RRR, no m/r/g, no jvd Resp: Clear to auscultation bilaterally GI: abdomen is soft, non-tender, non-distended, normal bowel sounds, no hepatosplenomegaly MSK: extremities are warm, no edema.  Skin: warm, no rash Neuro:  no focal deficits Psych: appropriate affect   Diagnostic Studies  11/2015 echo Study Conclusions   - Left ventricle: The cavity size was normal. There was severe   focal basal hypertrophy of the septum. Systolic function was   vigorous. The estimated ejection fraction was in the range of 65%   to 70%. Wall motion was normal; there were no regional wall   motion abnormalities. - Mitral valve: Moderately calcified annulus. There was mild   regurgitation. - Left atrium: Volume/bsa, ES (1-plane Simpson&'s, A4C): 26.5   ml/m^2. - Right ventricle: The cavity size was normal. Wall thickness was   mildly increased. - Pulmonary arteries:  Systolic pressure was within the normal   range.     09/2016 echo Study Conclusions   - Left ventricle: The cavity size was normal. Wall thickness was   increased in a pattern of mild LVH. Systolic function was normal.   The estimated ejection fraction was in the range of 60% to 65%.   Wall motion was normal; there were no regional wall motion    abnormalities. Features are consistent with a pseudonormal left   ventricular filling pattern, with concomitant abnormal relaxation   and increased filling pressure (grade 2 diastolic dysfunction). - Aortic valve: Mildly calcified annulus. Trileaflet. - Mitral valve: Calcified annulus. - Right atrium: Central venous pressure (est): 3 mm Hg. - Atrial septum: No defect or patent foramen ovale was identified. - Tricuspid valve: There was trivial regurgitation. - Pulmonary arteries: PA peak pressure: 25 mm Hg (S). - Pericardium, extracardiac: A prominent pericardial fat pad was   present.   Impressions:   - Mild LVH with LVEF 60-65% and grade 2 diastolic dysfunction.   Calcified mitral annulus. Mildly sclerotic aortic valve. Trivial   tricuspid regurgitation with normal PASP estimated 25 mmHg.   Prominent pericardial fat pad noted.   Assessment and Plan   1. Afib - no symptoms, continue current meds including anticoag    2. Chronic diastolic HF - no symptoms, she is euvolemic today. Continue diuretic     Arnoldo Lenis, M.D.

## 2020-10-02 NOTE — Patient Instructions (Signed)
Medication Instructions:  Your physician recommends that you continue on your current medications as directed. Please refer to the Current Medication list given to you today.  *If you need a refill on your cardiac medications before your next appointment, please call your pharmacy*   Lab Work: NONE   If you have labs (blood work) drawn today and your tests are completely normal, you will receive your results only by: . MyChart Message (if you have MyChart) OR . A paper copy in the mail If you have any lab test that is abnormal or we need to change your treatment, we will call you to review the results.   Testing/Procedures: NONE    Follow-Up: At [facility], you and your health needs are our priority.  As part of our continuing mission to provide you with exceptional heart care, we have created designated Provider Care Teams.  These Care Teams include your primary Cardiologist (physician) and Advanced Practice Providers (APPs -  Physician Assistants and Nurse Practitioners) who all work together to provide you with the care you need, when you need it.  We recommend signing up for the patient portal called "MyChart".  Sign up information is provided on this After Visit Summary.  MyChart is used to connect with patients for Virtual Visits (Telemedicine).  Patients are able to view lab/test results, encounter notes, upcoming appointments, etc.  Non-urgent messages can be sent to your provider as well.   To learn more about what you can do with MyChart, go to [email or web address].    Your next appointment:   6 month(s)  The format for your next appointment:   In Person  Provider:   Jonathan Branch, MD   Other Instructions Thank you for choosing [facility]!    

## 2020-10-13 ENCOUNTER — Other Ambulatory Visit: Payer: Self-pay | Admitting: Family

## 2020-10-16 ENCOUNTER — Ambulatory Visit: Payer: Medicare Other | Admitting: Family

## 2020-10-24 ENCOUNTER — Other Ambulatory Visit: Payer: Self-pay | Admitting: Family

## 2020-10-24 DIAGNOSIS — I152 Hypertension secondary to endocrine disorders: Secondary | ICD-10-CM

## 2020-10-25 DIAGNOSIS — J8410 Pulmonary fibrosis, unspecified: Secondary | ICD-10-CM | POA: Diagnosis not present

## 2020-10-25 DIAGNOSIS — I509 Heart failure, unspecified: Secondary | ICD-10-CM | POA: Diagnosis not present

## 2020-10-25 DIAGNOSIS — J449 Chronic obstructive pulmonary disease, unspecified: Secondary | ICD-10-CM | POA: Diagnosis not present

## 2020-10-30 ENCOUNTER — Other Ambulatory Visit: Payer: Self-pay

## 2020-10-30 ENCOUNTER — Other Ambulatory Visit: Payer: Self-pay | Admitting: Family

## 2020-10-30 ENCOUNTER — Ambulatory Visit (INDEPENDENT_AMBULATORY_CARE_PROVIDER_SITE_OTHER): Payer: Medicare Other | Admitting: Family

## 2020-10-30 ENCOUNTER — Encounter: Payer: Self-pay | Admitting: Family

## 2020-10-30 VITALS — BP 108/57 | HR 77 | Temp 97.7°F | Ht 67.0 in | Wt 204.6 lb

## 2020-10-30 DIAGNOSIS — I5031 Acute diastolic (congestive) heart failure: Secondary | ICD-10-CM | POA: Diagnosis not present

## 2020-10-30 DIAGNOSIS — I152 Hypertension secondary to endocrine disorders: Secondary | ICD-10-CM

## 2020-10-30 DIAGNOSIS — I480 Paroxysmal atrial fibrillation: Secondary | ICD-10-CM

## 2020-10-30 DIAGNOSIS — F331 Major depressive disorder, recurrent, moderate: Secondary | ICD-10-CM

## 2020-10-30 DIAGNOSIS — I5032 Chronic diastolic (congestive) heart failure: Secondary | ICD-10-CM | POA: Diagnosis not present

## 2020-10-30 DIAGNOSIS — R300 Dysuria: Secondary | ICD-10-CM

## 2020-10-30 DIAGNOSIS — E1142 Type 2 diabetes mellitus with diabetic polyneuropathy: Secondary | ICD-10-CM | POA: Diagnosis not present

## 2020-10-30 DIAGNOSIS — E1159 Type 2 diabetes mellitus with other circulatory complications: Secondary | ICD-10-CM

## 2020-10-30 DIAGNOSIS — E039 Hypothyroidism, unspecified: Secondary | ICD-10-CM

## 2020-10-30 DIAGNOSIS — F419 Anxiety disorder, unspecified: Secondary | ICD-10-CM

## 2020-10-30 DIAGNOSIS — K219 Gastro-esophageal reflux disease without esophagitis: Secondary | ICD-10-CM | POA: Diagnosis not present

## 2020-10-30 DIAGNOSIS — J449 Chronic obstructive pulmonary disease, unspecified: Secondary | ICD-10-CM

## 2020-10-30 DIAGNOSIS — N3000 Acute cystitis without hematuria: Secondary | ICD-10-CM

## 2020-10-30 DIAGNOSIS — M159 Polyosteoarthritis, unspecified: Secondary | ICD-10-CM

## 2020-10-30 DIAGNOSIS — E785 Hyperlipidemia, unspecified: Secondary | ICD-10-CM

## 2020-10-30 DIAGNOSIS — E559 Vitamin D deficiency, unspecified: Secondary | ICD-10-CM

## 2020-10-30 DIAGNOSIS — E1169 Type 2 diabetes mellitus with other specified complication: Secondary | ICD-10-CM | POA: Diagnosis not present

## 2020-10-30 DIAGNOSIS — N183 Chronic kidney disease, stage 3 unspecified: Secondary | ICD-10-CM

## 2020-10-30 LAB — URINALYSIS, COMPLETE
Bilirubin, UA: NEGATIVE
Glucose, UA: NEGATIVE
Ketones, UA: NEGATIVE
Nitrite, UA: POSITIVE — AB
Protein,UA: NEGATIVE
Specific Gravity, UA: 1.01 (ref 1.005–1.030)
Urobilinogen, Ur: 1 mg/dL (ref 0.2–1.0)
pH, UA: 6 (ref 5.0–7.5)

## 2020-10-30 LAB — MICROSCOPIC EXAMINATION: WBC, UA: >30 /hpf — AB (ref 0–5)

## 2020-10-30 LAB — BAYER DCA HB A1C WAIVED: HB A1C (BAYER DCA - WAIVED): 6.2 % (ref <7.0)

## 2020-10-30 MED ORDER — FUROSEMIDE 40 MG PO TABS: 40.0000 mg | ORAL_TABLET | Freq: Every day | ORAL | 1 refills | Status: DC

## 2020-10-30 MED ORDER — SERTRALINE HCL 50 MG PO TABS
50.0000 mg | ORAL_TABLET | Freq: Every day | ORAL | 1 refills | Status: DC
Start: 2020-10-30 — End: 2021-01-08

## 2020-10-30 MED ORDER — ATENOLOL 25 MG PO TABS: 25.0000 mg | ORAL_TABLET | Freq: Every day | ORAL | 1 refills | Status: DC

## 2020-10-30 MED ORDER — SPIRONOLACTONE 25 MG PO TABS: 25.0000 mg | ORAL_TABLET | Freq: Every day | ORAL | 0 refills | Status: DC

## 2020-10-30 MED ORDER — VITAMIN D (ERGOCALCIFEROL) 1.25 MG (50000 UNIT) PO CAPS: 50000.0000 [IU] | ORAL_CAPSULE | ORAL | 1 refills | Status: DC

## 2020-10-30 MED ORDER — PREDNISONE 5 MG PO TABS: 5.0000 mg | ORAL_TABLET | Freq: Every day | ORAL | 2 refills | Status: DC

## 2020-10-30 MED ORDER — DOXYCYCLINE HYCLATE 100 MG PO TABS: 100.0000 mg | ORAL_TABLET | Freq: Two times a day (BID) | ORAL | 0 refills | Status: DC

## 2020-10-30 MED ORDER — METFORMIN HCL ER 500 MG PO TB24: 500.0000 mg | ORAL_TABLET | Freq: Every day | ORAL | 1 refills | Status: DC

## 2020-10-30 MED ORDER — ROSUVASTATIN CALCIUM 10 MG PO TABS: ORAL_TABLET | ORAL | 1 refills | Status: DC

## 2020-10-30 MED ORDER — DICLOFENAC SODIUM 1 % EX GEL: 4.0000 g | Freq: Four times a day (QID) | CUTANEOUS | 1 refills | Status: DC

## 2020-10-30 MED ORDER — APIXABAN 5 MG PO TABS: 5.0000 mg | ORAL_TABLET | Freq: Two times a day (BID) | ORAL | 1 refills | Status: DC

## 2020-10-30 MED ORDER — OMEPRAZOLE 20 MG PO CPDR: 20.0000 mg | DELAYED_RELEASE_CAPSULE | Freq: Every day | ORAL | 1 refills | Status: DC

## 2020-10-30 NOTE — Progress Notes (Signed)
Subjective:    Patient ID: Lorraine Leonard, female    DOB: [birth year], 80 y.o.   MRN: [id number]  Chief Complaint  Patient presents with   Hypothyroidism   Dysuria    Pt presents to the office today for chronic follow up.    She is followed by Cardiologists every 6 months for CHF & A Fib. She is followed by Pulmonologist 6 months for pulmonary hypertension and Pulmonary Fibrosis. She was told to decrease her oxygen to as needed. Pt is not on oxygen today.  She reports her breathing is stable, but SOB with exertion. She is followed by Nephrologists every 6 months.  Dysuria  This is a new problem. The current episode started more than 1 year ago. The problem occurs intermittently. The quality of the pain is described as burning. The pain is mild. Associated symptoms include frequency and urgency. Pertinent negatives include no hematuria. She has tried increased fluids for the symptoms. The treatment provided moderate relief.  Hypertension This is a chronic problem. The current episode started more than 1 year ago. The problem is unchanged. The problem is controlled. Associated symptoms include malaise/fatigue and peripheral edema. Pertinent negatives include no blurred vision or shortness of breath. Risk factors for coronary artery disease include dyslipidemia and obesity. The current treatment provides moderate improvement.  Diabetes She presents for her follow-up diabetic visit. She has type 2 diabetes mellitus. Pertinent negatives for diabetes include no blurred vision and no foot paresthesias. Symptoms are stable. Diabetic complications include heart disease. Risk factors for coronary artery disease include dyslipidemia, diabetes mellitus, hypertension, sedentary lifestyle and post-menopausal. She is following a generally unhealthy diet. (Does not check BS at home) Eye exam is current.  Arthritis Presents for follow-up visit. She complains of pain and stiffness. The symptoms have been stable.  Affected locations include the left MCP, right MCP, left knee, right knee and right DIP. Her pain is at a severity of 6/10. Associated symptoms include dysuria.  Depression        This is a chronic problem.  The onset quality is gradual.   The problem occurs intermittently.  Associated symptoms include helplessness, hopelessness, irritable and restlessness.    Review of Systems  Constitutional:  Positive for malaise/fatigue.  Eyes:  Negative for blurred vision.  Respiratory:  Negative for shortness of breath.   Genitourinary:  Positive for dysuria, frequency and urgency. Negative for hematuria.  Musculoskeletal:  Positive for arthritis and stiffness.  Psychiatric/Behavioral:  Positive for depression.   All other systems reviewed and are negative.     Objective:   Physical Exam Vitals reviewed.  Constitutional:      General: She is irritable. She is not in acute distress.    Appearance: She is well-developed.  HENT:     Head: Normocephalic and atraumatic.     Right Ear: Tympanic membrane normal. There is impacted cerumen.     Left Ear: Tympanic membrane normal.  Eyes:     Pupils: Pupils are equal, round, and reactive to light.  Neck:     Thyroid: No thyromegaly.  Cardiovascular:     Rate and Rhythm: Normal rate and regular rhythm.     Heart sounds: Normal heart sounds. No murmur heard. Pulmonary:     Effort: Pulmonary effort is normal. No respiratory distress.     Breath sounds: Normal breath sounds. No wheezing.  Abdominal:     General: Bowel sounds are normal. There is no distension.     Palpations: Abdomen is  soft.     Tenderness: There is no abdominal tenderness.  Musculoskeletal:        General: No tenderness. Normal range of motion.     Cervical back: Normal range of motion and neck supple.  Skin:    General: Skin is warm and dry.  Neurological:     Mental Status: She is alert and oriented to person, place, and time.     Cranial Nerves: No cranial nerve deficit.      Motor: Weakness present.     Gait: Gait abnormal (using rolling walker).     Deep Tendon Reflexes: Reflexes are normal and symmetric.  Psychiatric:        Behavior: Behavior normal.        Thought Content: Thought content normal.        Judgment: Judgment normal.    Warm water and peroxide used to wash out right ear. Unable to complete remove cerumen. Pt will use OTC drops and next visit try again if not better.   BP (!) 108/57   Pulse 77   Temp 97.7 F (36.5 C) (Temporal)   Ht $R'5\' 7"'Gs$  (1.702 m)   Wt 204 lb 9.6 oz (92.8 kg)   SpO2 94%   BMI 32.04 kg/m      Assessment & Plan:   Lorraine Leonard comes in today with chief complaint of Hypothyroidism and Dysuria   Diagnosis and orders addressed:  1. Dysuria - Urinalysis, Complete - Urine Culture - CMP14+EGFR - CBC with Differential/Platelet  2. Anxiety - sertraline (ZOLOFT) 50 MG tablet; Take 1 tablet (50 mg total) by mouth daily.  Dispense: 90 tablet; Refill: 1 - CMP14+EGFR - CBC with Differential/Platelet  3. Gastro-esophageal reflux disease without esophagitis - omeprazole (PRILOSEC) 20 MG capsule; Take 1 capsule (20 mg total) by mouth daily.  Dispense: 90 capsule; Refill: 1 - CMP14+EGFR - CBC with Differential/Platelet  4. Hypertension associated with diabetes ([place]) - atenolol (TENORMIN) 25 MG tablet; Take 1 tablet (25 mg total) by mouth daily.  Dispense: 90 tablet; Refill: 1 - CMP14+EGFR - CBC with Differential/Platelet  5. Type 2 diabetes mellitus with diabetic polyneuropathy, without long-term current use of insulin (HCC) - metFORMIN (GLUCOPHAGE-XR) 500 MG 24 hr tablet; Take 1 tablet (500 mg total) by mouth daily with breakfast.  Dispense: 90 tablet; Refill: 1 - Bayer DCA Hb A1c Waived - CMP14+EGFR - CBC with Differential/Platelet  6. Vitamin D deficiency - Vitamin D, Ergocalciferol, (DRISDOL) 1.25 MG (50000 UNIT) CAPS capsule; Take 1 capsule (50,000 Units total) by mouth every 7 (seven) days.  Dispense: 12  capsule; Refill: 1 - CMP14+EGFR - CBC with Differential/Platelet  7. Paroxysmal atrial fibrillation (HCC)  8. Acute diastolic CHF (congestive heart failure) ([place])  9. Chronic diastolic heart failure ([place])  10. Chronic obstructive pulmonary disease, unspecified COPD type ([employer])  11. Gastroesophageal reflux disease, unspecified whether esophagitis present  12. Hyperlipidemia associated with type 2 diabetes mellitus ([place])  13. Hypothyroidism, unspecified type  14. Osteoarthritis of multiple joints, unspecified osteoarthritis type  15. Morbid obesity ([place])  16. Stage 3 chronic kidney disease, unspecified whether stage 3a or 3b CKD ([place])  17. Moderate episode of recurrent major depressive disorder ([place])   18. Acute cystitis without hematuria Stop Keflex 250 and start doxycycline then restart Keflex once completed - doxycycline (VIBRA-TABS) 100 MG tablet; Take 1 tablet (100 mg total) by mouth 2 (two) times daily.  Dispense: 20 tablet; Refill: 0   Labs pending Health Maintenance reviewed Diet and exercise encouraged  Follow up plan: 3 months    Evelina Dun, FNP

## 2020-10-30 NOTE — Patient Instructions (Signed)
Health Maintenance, Female Adopting a healthy lifestyle and getting preventive care are important in promoting health and wellness. Ask your health care provider about: The right schedule for you to have regular tests and exams. Things you can do on your own to prevent diseases and keep yourself healthy. What should I know about diet, weight, and exercise? Eat a healthy diet  Eat a diet that includes plenty of vegetables, fruits, low-fat dairy products, and lean protein. Do not eat a lot of foods that are high in solid fats, added sugars, or sodium.  Maintain a healthy weight Body mass index (BMI) is used to identify weight problems. It estimates body fat based on height and weight. Your health care provider can help determineyour BMI and help you achieve or maintain a healthy weight. Get regular exercise Get regular exercise. This is one of the most important things you can do for your health. Most adults should: Exercise for at least 150 minutes each week. The exercise should increase your heart rate and make you sweat (moderate-intensity exercise). Do strengthening exercises at least twice a week. This is in addition to the moderate-intensity exercise. Spend less time sitting. Even light physical activity can be beneficial. Watch cholesterol and blood lipids Have your blood tested for lipids and cholesterol at 80 years of age, then havethis test every 5 years. Have your cholesterol levels checked more often if: Your lipid or cholesterol levels are high. You are older than 80 years of age. You are at high risk for heart disease. What should I know about cancer screening? Depending on your health history and family history, you may need to have cancer screening at various ages. This may include screening for: Breast cancer. Cervical cancer. Colorectal cancer. Skin cancer. Lung cancer. What should I know about heart disease, diabetes, and high blood pressure? Blood pressure and heart  disease High blood pressure causes heart disease and increases the risk of stroke. This is more likely to develop in people who have high blood pressure readings, are of African descent, or are overweight. Have your blood pressure checked: Every 3-5 years if you are 18-39 years of age. Every year if you are 40 years old or older. Diabetes Have regular diabetes screenings. This checks your fasting blood sugar level. Have the screening done: Once every three years after age 40 if you are at a normal weight and have a low risk for diabetes. More often and at a younger age if you are overweight or have a high risk for diabetes. What should I know about preventing infection? Hepatitis B If you have a higher risk for hepatitis B, you should be screened for this virus. Talk with your health care provider to find out if you are at risk forhepatitis B infection. Hepatitis C Testing is recommended for: Everyone born from 1945 through 1965. Anyone with known risk factors for hepatitis C. Sexually transmitted infections (STIs) Get screened for STIs, including gonorrhea and chlamydia, if: You are sexually active and are younger than 80 years of age. You are older than 80 years of age and your health care provider tells you that you are at risk for this type of infection. Your sexual activity has changed since you were last screened, and you are at increased risk for chlamydia or gonorrhea. Ask your health care provider if you are at risk. Ask your health care provider about whether you are at high risk for HIV. Your health care provider may recommend a prescription medicine to help   prevent HIV infection. If you choose to take medicine to prevent HIV, you should first get tested for HIV. You should then be tested every 3 months for as long as you are taking the medicine. Pregnancy If you are about to stop having your period (premenopausal) and you may become pregnant, seek counseling before you get  pregnant. Take 400 to 800 micrograms (mcg) of folic acid every day if you become pregnant. Ask for birth control (contraception) if you want to prevent pregnancy. Osteoporosis and menopause Osteoporosis is a disease in which the bones lose minerals and strength with aging. This can result in bone fractures. If you are 65 years old or older, or if you are at risk for osteoporosis and fractures, ask your health care provider if you should: Be screened for bone loss. Take a calcium or vitamin D supplement to lower your risk of fractures. Be given hormone replacement therapy (HRT) to treat symptoms of menopause. Follow these instructions at home: Lifestyle Do not use any products that contain nicotine or tobacco, such as cigarettes, e-cigarettes, and chewing tobacco. If you need help quitting, ask your health care provider. Do not use street drugs. Do not share needles. Ask your health care provider for help if you need support or information about quitting drugs. Alcohol use Do not drink alcohol if: Your health care provider tells you not to drink. You are pregnant, may be pregnant, or are planning to become pregnant. If you drink alcohol: Limit how much you use to 0-1 drink a day. Limit intake if you are breastfeeding. Be aware of how much alcohol is in your drink. In the [place] one drink equals one 12 oz bottle of beer (355 mL), one 5 oz glass of wine (148 mL), or one 1 oz glass of hard liquor (44 mL). General instructions Schedule regular health, dental, and eye exams. Stay current with your vaccines. Tell your health care provider if: You often feel depressed. You have ever been abused or do not feel safe at home. Summary Adopting a healthy lifestyle and getting preventive care are important in promoting health and wellness. Follow your health care provider's instructions about healthy diet, exercising, and getting tested or screened for diseases. Follow your health care provider's  instructions on monitoring your cholesterol and blood pressure. This information is not intended to replace advice given to you by your health care provider. Make sure you discuss any questions you have with your healthcare provider. Document Revised: 03/31/2018 Document Reviewed: 03/31/2018 Elsevier Patient Education  2022 Elsevier Inc.  

## 2020-10-31 LAB — CBC WITH DIFFERENTIAL/PLATELET
Basophils Absolute: 0.1 10*3/uL (ref 0.0–0.2)
Basos: 1 %
EOS (ABSOLUTE): 0.2 10*3/uL (ref 0.0–0.4)
Eos: 2 %
Hematocrit: 40.5 % (ref 34.0–46.6)
Hemoglobin: 12.9 g/dL (ref 11.1–15.9)
Immature Grans (Abs): 0 10*3/uL (ref 0.0–0.1)
Immature Granulocytes: 0 %
Lymphocytes Absolute: 2.6 10*3/uL (ref 0.7–3.1)
Lymphs: 21 %
MCH: 26.7 pg (ref 26.6–33.0)
MCHC: 31.9 g/dL (ref 31.5–35.7)
MCV: 84 fL (ref 79–97)
Monocytes Absolute: 1.4 10*3/uL — ABNORMAL HIGH (ref 0.1–0.9)
Monocytes: 11 %
Neutrophils Absolute: 8.3 10*3/uL — ABNORMAL HIGH (ref 1.4–7.0)
Neutrophils: 65 %
Platelets: 283 10*3/uL (ref 150–450)
RBC: 4.84 x10E6/uL (ref 3.77–5.28)
RDW: 13.5 % (ref 11.7–15.4)
WBC: 12.7 10*3/uL — ABNORMAL HIGH (ref 3.4–10.8)

## 2020-10-31 LAB — CMP14+EGFR
ALT: 16 IU/L (ref 0–32)
AST: 23 IU/L (ref 0–40)
Albumin/Globulin Ratio: 1.3 (ref 1.2–2.2)
Albumin: 4.3 g/dL (ref 3.7–4.7)
Alkaline Phosphatase: 82 IU/L (ref 44–121)
BUN/Creatinine Ratio: 13 (ref 12–28)
BUN: 17 mg/dL (ref 8–27)
Bilirubin Total: 0.4 mg/dL (ref 0.0–1.2)
CO2: 24 mmol/L (ref 20–29)
Calcium: 10.3 mg/dL (ref 8.7–10.3)
Chloride: 97 mmol/L (ref 96–106)
Creatinine, Ser: 1.27 mg/dL — ABNORMAL HIGH (ref 0.57–1.00)
Globulin, Total: 3.4 g/dL (ref 1.5–4.5)
Glucose: 134 mg/dL — ABNORMAL HIGH (ref 65–99)
Potassium: 5.2 mmol/L (ref 3.5–5.2)
Sodium: 142 mmol/L (ref 134–144)
Total Protein: 7.7 g/dL (ref 6.0–8.5)
eGFR: 43 mL/min/{1.73_m2} — ABNORMAL LOW (ref >59)

## 2020-11-02 LAB — URINE CULTURE

## 2020-11-16 ENCOUNTER — Encounter: Payer: Self-pay | Admitting: Family Medicine

## 2020-11-16 ENCOUNTER — Ambulatory Visit (INDEPENDENT_AMBULATORY_CARE_PROVIDER_SITE_OTHER): Payer: Medicare Other | Admitting: Family Medicine

## 2020-11-16 DIAGNOSIS — R399 Unspecified symptoms and signs involving the genitourinary system: Secondary | ICD-10-CM | POA: Diagnosis not present

## 2020-11-16 LAB — URINALYSIS, COMPLETE
Bilirubin, UA: NEGATIVE
Ketones, UA: NEGATIVE
Nitrite, UA: POSITIVE — AB
Protein,UA: NEGATIVE
RBC, UA: NEGATIVE
Specific Gravity, UA: 1.01 (ref 1.005–1.030)
Urobilinogen, Ur: 0.2 mg/dL (ref 0.2–1.0)
pH, UA: 6 (ref 5.0–7.5)

## 2020-11-16 LAB — MICROSCOPIC EXAMINATION: Bacteria, UA: NONE SEEN

## 2020-11-16 NOTE — Progress Notes (Signed)
   Virtual Visit  Note Due to COVID-19 pandemic this visit was conducted virtually. This visit type was conducted due to national recommendations for restrictions regarding the COVID-19 Pandemic (e.g. social distancing, sheltering in place) in an effort to limit this patient's exposure and mitigate transmission in our community. All issues noted in this document were discussed and addressed.  A physical exam was not performed with this format.  I connected with Lorraine Leonard on 11/16/20 at 1635  by telephone and verified that I am speaking with the correct person using two identifiers. Lorraine Leonard is currently located at home and her son is currently with her during the visit. The provider, Gwenlyn Perking, FNP is located in their office at time of visit.  I discussed the limitations, risks, security and privacy concerns of performing an evaluation and management service by telephone and the availability of in person appointments. I also discussed with the patient that there may be a patient responsible charge related to this service. The patient expressed understanding and agreed to proceed.  CC: UTI symptoms  History and Present Illness:  HPI Lorraine Leonard reports dysuria this morning. She took an AZO and has not had dysuria since. She denies fever, nausea, vomiting, frequency, urgency, abdominal pain, chills, or flank pain. Denies hematuria.     ROS As per HPI.   Observations/Objective: Alert and oriented x 3. Able to speak in full sentences without difficulty.   Urine dipstick shows positive for nitrates and positive for leukocytes.  Micro exam: 6-10 WBC's per HPF and no + bacteria.   Assessment and Plan: Kat was seen today for urinary tract infection.  Diagnoses and all orders for this visit:  UTI symptoms UA + for nitrates however patient took AZO and this resolved symptoms. Micro is negative. Culture pending. Discussed hydration. Return to office for new or worsening symptoms, or  if symptoms persist.  -     Urine Culture -     Urinalysis, Complete -     Microscopic Examination    Follow Up Instructions: As needed.     I discussed the assessment and treatment plan with the patient. The patient was provided an opportunity to ask questions and all were answered. The patient agreed with the plan and demonstrated an understanding of the instructions.   The patient was advised to call back or seek an in-person evaluation if the symptoms worsen or if the condition fails to improve as anticipated.  The above assessment and management plan was discussed with the patient. The patient verbalized understanding of and has agreed to the management plan. Patient is aware to call the clinic if symptoms persist or worsen. Patient is aware when to return to the clinic for a follow-up visit. Patient educated on when it is appropriate to go to the emergency department.   Time call ended:  1646  I provided 11 minutes of  non face-to-face time during this encounter.    Gwenlyn Perking, FNP

## 2020-11-19 ENCOUNTER — Other Ambulatory Visit: Payer: Self-pay | Admitting: Family Medicine

## 2020-11-19 DIAGNOSIS — N3000 Acute cystitis without hematuria: Secondary | ICD-10-CM

## 2020-11-19 LAB — URINE CULTURE

## 2020-11-19 MED ORDER — CEPHALEXIN 500 MG PO CAPS: 500.0000 mg | ORAL_CAPSULE | Freq: Two times a day (BID) | ORAL | 0 refills | Status: DC

## 2020-11-20 ENCOUNTER — Other Ambulatory Visit: Payer: Self-pay | Admitting: Family Medicine

## 2020-11-20 DIAGNOSIS — N3000 Acute cystitis without hematuria: Secondary | ICD-10-CM

## 2020-11-20 MED ORDER — SULFAMETHOXAZOLE-TRIMETHOPRIM 800-160 MG PO TABS: 1.0000 | ORAL_TABLET | Freq: Two times a day (BID) | ORAL | 0 refills | Status: AC

## 2020-11-25 DIAGNOSIS — J8410 Pulmonary fibrosis, unspecified: Secondary | ICD-10-CM | POA: Diagnosis not present

## 2020-11-25 DIAGNOSIS — J449 Chronic obstructive pulmonary disease, unspecified: Secondary | ICD-10-CM | POA: Diagnosis not present

## 2020-11-25 DIAGNOSIS — I509 Heart failure, unspecified: Secondary | ICD-10-CM | POA: Diagnosis not present

## 2020-12-04 ENCOUNTER — Ambulatory Visit (INDEPENDENT_AMBULATORY_CARE_PROVIDER_SITE_OTHER): Payer: Medicare Other

## 2020-12-04 DIAGNOSIS — I442 Atrioventricular block, complete: Secondary | ICD-10-CM

## 2020-12-04 LAB — CUP PACEART REMOTE DEVICE CHECK
Battery Remaining Longevity: 46 mo
Battery Voltage: 2.87 V
Brady Statistic AP VP Percent: 0.13 %
Brady Statistic AP VS Percent: 99.87 %
Brady Statistic AS VP Percent: 0 %
Brady Statistic AS VS Percent: 0.01 %
Brady Statistic RA Percent Paced: 99.99 %
Brady Statistic RV Percent Paced: 0.13 %
Date Time Interrogation Session: 20220816011921
Implantable Lead Implant Date: 20190128
Implantable Lead Implant Date: 20190128
Implantable Lead Location: 753860
Implantable Lead Location: 753860
Implantable Lead Model: 3830
Implantable Lead Model: 5076
Implantable Pulse Generator Implant Date: 20190128
Lead Channel Impedance Value: 304 Ohm
Lead Channel Impedance Value: 323 Ohm
Lead Channel Impedance Value: 380 Ohm
Lead Channel Impedance Value: 418 Ohm
Lead Channel Sensing Intrinsic Amplitude: 12.375 mV
Lead Channel Sensing Intrinsic Amplitude: 12.375 mV
Lead Channel Sensing Intrinsic Amplitude: 3.625 mV
Lead Channel Sensing Intrinsic Amplitude: 3.625 mV
Lead Channel Setting Pacing Amplitude: 2.5 V
Lead Channel Setting Pacing Amplitude: 3.5 V
Lead Channel Setting Pacing Pulse Width: 0.4 ms
Lead Channel Setting Sensing Sensitivity: 0.9 mV

## 2020-12-11 ENCOUNTER — Encounter: Payer: Medicare Other | Admitting: Internal Medicine

## 2020-12-17 ENCOUNTER — Ambulatory Visit: Payer: Medicare Other | Admitting: Family

## 2020-12-19 ENCOUNTER — Other Ambulatory Visit: Payer: Self-pay | Admitting: Family

## 2020-12-21 ENCOUNTER — Other Ambulatory Visit: Payer: Self-pay | Admitting: Family

## 2020-12-23 NOTE — Progress Notes (Signed)
Remote pacemaker transmission.   

## 2020-12-26 DIAGNOSIS — J449 Chronic obstructive pulmonary disease, unspecified: Secondary | ICD-10-CM | POA: Diagnosis not present

## 2020-12-26 DIAGNOSIS — I509 Heart failure, unspecified: Secondary | ICD-10-CM | POA: Diagnosis not present

## 2020-12-26 DIAGNOSIS — J8410 Pulmonary fibrosis, unspecified: Secondary | ICD-10-CM | POA: Diagnosis not present

## 2021-01-08 ENCOUNTER — Ambulatory Visit (INDEPENDENT_AMBULATORY_CARE_PROVIDER_SITE_OTHER): Payer: Medicare Other | Admitting: Internal Medicine

## 2021-01-08 ENCOUNTER — Other Ambulatory Visit: Payer: Self-pay

## 2021-01-08 ENCOUNTER — Encounter: Payer: Self-pay | Admitting: Internal Medicine

## 2021-01-08 VITALS — BP 145/73 | HR 85 | Ht 67.0 in | Wt 206.0 lb

## 2021-01-08 DIAGNOSIS — Z950 Presence of cardiac pacemaker: Secondary | ICD-10-CM | POA: Diagnosis not present

## 2021-01-08 DIAGNOSIS — I4891 Unspecified atrial fibrillation: Secondary | ICD-10-CM | POA: Diagnosis not present

## 2021-01-08 NOTE — Patient Instructions (Signed)
Medication Instructions:  Your physician recommends that you continue on your current medications as directed. Please refer to the Current Medication list given to you today.  *If you need a refill on your cardiac medications before your next appointment, please call your pharmacy*   Lab Work: NONE   If you have labs (blood work) drawn today and your tests are completely normal, you will receive your results only by: . MyChart Message (if you have MyChart) OR . A paper copy in the mail If you have any lab test that is abnormal or we need to change your treatment, we will call you to review the results.   Testing/Procedures: NONE    Follow-Up: At [facility], you and your health needs are our priority.  As part of our continuing mission to provide you with exceptional heart care, we have created designated Provider Care Teams.  These Care Teams include your primary Cardiologist (physician) and Advanced Practice Providers (APPs -  Physician Assistants and Nurse Practitioners) who all work together to provide you with the care you need, when you need it.  We recommend signing up for the patient portal called "MyChart".  Sign up information is provided on this After Visit Summary.  MyChart is used to connect with patients for Virtual Visits (Telemedicine).  Patients are able to view lab/test results, encounter notes, upcoming appointments, etc.  Non-urgent messages can be sent to your provider as well.   To learn more about what you can do with MyChart, go to [email or web address].    Your next appointment:   1 year(s)  The format for your next appointment:   In Person  Provider:   Gregg Taylor, MD   Other Instructions Thank you for choosing [facility]!    

## 2021-01-08 NOTE — Progress Notes (Signed)
HPI Lorraine Leonard returns today for followup. She is a chronically ill 79 yo woman with a h/o chronic diastolic heart failure, uncontrolled atrial fib s/p AV node ablation and PPM insertion. When we saw her last she was noted to be PM dependent and we turned her HR up to 70 from 50/min. She notes that she has had gradually improved. Her fatigue and dyspnea remain but have gotten better. She is sedentary but not quite as much as she was. She has not lost any more weight. She notes that she feels ok at rest but she feels some palpitations with exertion.  Allergies  Allergen Reactions   Allopurinol Swelling    Feet and legs swell   Mobic [Meloxicam] Swelling and Other (See Comments)    Feet and Leg swelling     Current Outpatient Medications  Medication Sig Dispense Refill   albuterol (PROVENTIL) (2.5 MG/3ML) 0.083% nebulizer solution Take 3 mLs (2.5 mg total) by nebulization every 6 (six) hours as needed for wheezing or shortness of breath. J96.01 150 mL 1   albuterol (VENTOLIN HFA) 108 (90 Base) MCG/ACT inhaler TAKE 2 PUFFS BY MOUTH EVERY 6 HOURS AS NEEDED FOR WHEEZE OR SHORTNESS OF BREATH 8.5 each 2   apixaban (ELIQUIS) 5 MG TABS tablet Take 1 tablet (5 mg total) by mouth 2 (two) times daily. 180 tablet 1   atenolol (TENORMIN) 25 MG tablet Take 1 tablet (25 mg total) by mouth daily. 90 tablet 1   calcium citrate (CALCITRATE - DOSED IN MG ELEMENTAL CALCIUM) 950 MG tablet Take 1 tablet by mouth 2 (two) times daily.      cyanocobalamin 1000 MCG tablet Take 1,000 mcg by mouth daily. Vitamin b12 every AM     diclofenac Sodium (VOLTAREN) 1 % GEL APPLY 4 G TOPICALLY 4 TIMES DAILY 400 g 1   fluticasone (FLONASE) 50 MCG/ACT nasal spray PLACE 1 SPRAY INTO BOTH NOSTRILS 2 (TWO) TIMES DAILY AS NEEDED FOR ALLERGIES OR RHINITIS. 48 mL 1   furosemide (LASIX) 40 MG tablet Take 1 tablet (40 mg total) by mouth daily. 90 tablet 1   levocetirizine (XYZAL) 5 MG tablet TAKE 1 TABLET BY MOUTH EVERY DAY IN THE  EVENING 90 tablet 1   levothyroxine (SYNTHROID) 112 MCG tablet Take 1 tablet (112 mcg total) by mouth daily. 90 tablet 1   metFORMIN (GLUCOPHAGE-XR) 500 MG 24 hr tablet Take 1 tablet (500 mg total) by mouth daily with breakfast. 90 tablet 1   Multiple Vitamins-Minerals (PRESERVISION AREDS 2) CAPS Take 1 capsule by mouth 2 (two) times daily.     Olopatadine HCl 0.2 % SOLN INSTILL 1 DROP INTO BOTH EYES EVERY DAY 10 mL 5   omeprazole (PRILOSEC) 20 MG capsule Take 1 capsule (20 mg total) by mouth daily. 90 capsule 1   OXYGEN Inhale 3 L into the lungs every evening.     Polyethyl Glycol-Propyl Glycol (SYSTANE) 0.4-0.3 % GEL ophthalmic gel Place 1 application into both eyes at bedtime as needed (dry eyes).     predniSONE (DELTASONE) 5 MG tablet Take 1 tablet (5 mg total) by mouth daily with breakfast. 90 tablet 2   rosuvastatin (CRESTOR) 10 MG tablet TAKE 1 TABLET BY MOUTH EVERYDAY AT BEDTIME 90 tablet 1   spironolactone (ALDACTONE) 25 MG tablet Take 1 tablet (25 mg total) by mouth daily. 90 tablet 0   vitamin C (ASCORBIC ACID) 500 MG tablet Take 1,000 mg by mouth daily. AM     Vitamin  D, Ergocalciferol, (DRISDOL) 1.25 MG (50000 UNIT) CAPS capsule Take 1 capsule (50,000 Units total) by mouth every 7 (seven) days. 12 capsule 1   No current facility-administered medications for this visit.     Past Medical History:  Diagnosis Date   Acute diastolic CHF (congestive heart failure) (HCC) 11/11/2016   Allergic rhinitis    Anxiety    Anxiety    Arthritis    "~ all my joints" (05/18/2017)   Atrial fibrillation (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   Past Surgical History:  Procedure Laterality Date   AV NODE ABLATION  05/18/2017   AV NODE ABLATION N/A 05/18/2017   Procedure: AV NODE ABLATION;  Surgeon: Evans Lance, MD;  Location: [facility];  Service: Cardiovascular;  Laterality: N/A;   BACK SURGERY     BREAST LUMPECTOMY Left    CARDIOVERSION N/A 01/30/2017   Procedure: CARDIOVERSION;  Surgeon: Josue Hector, MD;  [facility]: [facility];  Service: Cardiovascular;  Laterality: N/A;   CARDIOVERSION N/A 04/09/2017   Procedure: CARDIOVERSION;  Surgeon: Satira Sark, MD;  Location: [facility];  Service: Cardiovascular;  Laterality: N/A;   CATARACT EXTRACTION W/ INTRAOCULAR LENS  IMPLANT, BILATERAL Bilateral    DILATION AND CURETTAGE OF UTERUS     EUS N/A 10/03/2015   Procedure: UPPER ENDOSCOPIC ULTRASOUND (EUS) RADIAL;  Surgeon: Arta Silence, MD;  Location: [facility];  Service: Endoscopy;  Laterality: N/A;   FINE NEEDLE ASPIRATION N/A 10/03/2015   Procedure: FINE NEEDLE ASPIRATION (FNA) RADIAL;  Surgeon: Arta Silence, MD;  Location: [facility];  Service: Endoscopy;  Laterality: N/A;   INSERT / REPLACE / REMOVE PACEMAKER  05/18/2017   LUMBAR LAMINECTOMY  1995; 06/23/2008   L4-5/notes 08/20/2010   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   NEPHRECTOMY TRANSPLANTED ORGAN     PACEMAKER IMPLANT N/A 05/18/2017   Procedure: PACEMAKER IMPLANT;  Surgeon: Evans Lance, MD;  Location: [facility];  Service: Cardiovascular;  Laterality: N/A;   TOTAL ABDOMINAL HYSTERECTOMY      TUBAL LIGATION       Family History  Problem Relation Age of Onset   Diabetes Other    Hypertension Other    Coronary artery disease Other    Stroke Other    Asthma Other    Kidney disease Sister    Cancer Sister        lung   Lung cancer Sister  Cancer Sister        lung   Lung cancer Sister    Cancer Sister        lung   Suicidality Son        committed suicide in 2009   CVA Son    Hyperlipidemia Other    Anxiety disorder Other    Migraines Other    Heart failure Father    Healthy Brother    Cancer Sister        lung   Healthy Sister      Social History   Socioeconomic History   Marital status: Married    Spouse name: Mortimer Fries   Number of children: 4   Years of education: Not on file   Highest education level: 9th grade  Occupational History   Occupation: retired    Comment: cna  Tobacco Use   Smoking status: Never   Smokeless tobacco: Never  Vaping Use   Vaping Use: Never used  Substance and Sexual Activity   Alcohol use: No   Drug use: No   Sexual activity: Not Currently    Birth control/protection: Surgical  Other Topics Concern   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   Pt lives with her husband in a single wide trailer. They have 4 children and 13 grandchildren.    Social Determinants of Health   Financial Resource Strain: Not on file  Food Insecurity: Not on file  Transportation Needs: Not on file  Physical Activity: Not on file  Stress: Not on file  Social Connections: Not on file  Intimate Partner Violence: Not on file     BP (!) 145/73   Pulse 85   Ht 5\' 7"  (1.702 m)   Wt 206 lb (93.4 kg)   BMI 32.26 kg/m   Physical Exam:  Obese chronically ill appearing NAD HEENT: Unremarkable Neck:  No JVD, no thyromegally Lymphatics:  No adenopathy Back:  No CVA tenderness Lungs:  Clear with no wheezes HEART:  Regular rate rhythm, no murmurs, no rubs, no  clicks Abd:  soft, positive bowel sounds, no organomegally, no rebound, no guarding Ext:  2 plus pulses, no edema, no cyanosis, no clubbing Skin:  No rashes no nodules Neuro:  CN II through XII intact, motor grossly intact  EKG - atrial fib with ventricular pacing  DEVICE  Normal device function.  See PaceArt for details.   Assess/Plan:  Atrial fib - her VR is well controlled. She has an escape rhythm of 50/min today. Obesity - her weight is unchanged. I encouraged her to get under 200. HTN - her bp is up a bit today. We will follow. 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 The His bundle lead in the atrial port has a chronically elevated threshold. We will follow.  Lorraine Overlie Lakaisha Danish,MD

## 2021-01-09 ENCOUNTER — Encounter: Payer: Self-pay | Admitting: Internal Medicine

## 2021-01-09 ENCOUNTER — Other Ambulatory Visit: Payer: Self-pay

## 2021-01-09 ENCOUNTER — Ambulatory Visit (INDEPENDENT_AMBULATORY_CARE_PROVIDER_SITE_OTHER): Payer: Medicare Other | Admitting: Internal Medicine

## 2021-01-09 VITALS — BP 120/74 | HR 66 | Temp 98.8°F | Ht 67.0 in | Wt 197.1 lb

## 2021-01-09 DIAGNOSIS — J8410 Pulmonary fibrosis, unspecified: Secondary | ICD-10-CM

## 2021-01-09 DIAGNOSIS — J449 Chronic obstructive pulmonary disease, unspecified: Secondary | ICD-10-CM

## 2021-01-09 DIAGNOSIS — Z23 Encounter for immunization: Secondary | ICD-10-CM | POA: Diagnosis not present

## 2021-01-09 NOTE — Progress Notes (Signed)
Lorraine Leonard, female    DOB: 1940-12-17   MRN: [id number]   Brief patient profile:  88 yowf never smoker referred to pulmonary clinic in [place]  03/19/2020 by Dr    Lamonte Sakai as she lives in [place]   Phone care 05/13/19 by Dr Lamonte Sakai:  History of Present Illness: Ms. 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  She may also have obstructive sleep apnea but this is not been confirmed as she has not wanted a polysomnogram.  It appears that she is not been on maintenance prednisone therapy since August 2021.  She was seen here in a phone visit earlier this month and treated with a prednisone taper for possible flare in either her COPD, ILD or both.  She was continued on Incruse and supplemental oxygen at 3 L/min by nasal cannula.    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 She reports that the pred taper helped her breathing - now feeling back to baseline. She is following w Dr Trudie Reed, started her on prednisone 10mg  which she is now taking daily. She is on Incruse daily. She has albuterol available is using 0-1x a day. She remains on flonase, xyzal. Good compliance w her O2.   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 She is going to communicate w Dr Trudie Reed and continue if she agrees Continue flonase and xyzal.   History of Present Illness  03/19/2020   Pulmonary/ 1st office eval/Tameko Halder  Prednisone ? 5 mg daily (was on incruse but off x months) vaccine resistant Chief Complaint  Patient presents with   Follow-up    Patient states that she has had some wheezing for the last 4-5 days and coughing more. Wears 3 liters oxygen all the time.    Dyspnea:  Mostly housebound / very sedentary  Cough: more x one week  Sleep: bed is flat one pillow  SABA use: hfa has not used / rarely needed more than twice a week  Has not used in months  02 : runs in high 90s s 02 at rest but not checking with activity Sleeps on 3lpm   Rec Plan A = Automatic = Always=    BREO  200 one each am (you take as many drags as want but only one click)   - if better over the next 2 weeks then fill the BREO 270 one click each am.  Plan B = Backup (to supplement plan A, not to replace it) Only use your albuterol inhaler as a rescue medication Plan C = Crisis (instead of Plan B but only if Plan B stops working) - only use your albuterol nebulizer if you first try Plan B and it fails to help > ok to use the nebulizer up to every 4 hours but if start needing it regularly call for immediate appointment Plan D = Deltasone = Doubling  - deltasone is prednisone so double it if needing place C (the nebulizer)  much at all  Make sure you check your oxygen saturation at highest level of activity to be sure it stays over 90%    01/09/2021  f/u ov/[place] office/Jahlil Ziller re: RA ILD/ AB / 02 dep maint on prednisone 5 mg x 2-3 weeks bid due to itching/ nasal drainage / no maint inhalers Chief Complaint  Patient presents with   Follow-up    SOB and cough are about the same as last visit. Sometimes coughs up clear/ yellow mucus. Only uses oxygen 3L O2 when needed at home.    Dyspnea:  mostly housebound / gets let off at door when goes anywhere  Cough: since last visit worse as day goes on / minimally productive mucoid  Sleeping: bed flat/ one pillow  SABA use: rarely hfa/ almost never neb/ on  prednisone 5 mg daily  02: 3lpm orn  Covid status: never vaxed, never infected    No obvious day to day or daytime variability or assoc excess/ purulent sputum or mucus plugs or hemoptysis or cp or chest tightness, subjective wheeze or overt sinus or hb symptoms.   Sleeping  without nocturnal  or early am exacerbation  of respiratory  c/o's or need for noct saba. Also denies any obvious fluctuation of symptoms with weather or environmental changes or other aggravating or alleviating factors except as outlined above   No unusual exposure hx or h/o childhood pna/ asthma or knowledge of premature birth.  Current Allergies, Complete Past Medical History, Past Surgical History, Family History, and Social History were reviewed in [employer] record.  ROS  The following are not active complaints unless bolded Hoarseness, sore throat, dysphagia, dental problems, itching, sneezing,  nasal congestion or discharge of excess mucus or purulent secretions, ear ache,   fever, chills, sweats, unintended wt loss or wt gain, classically pleuritic or exertional cp,  orthopnea pnd or arm/hand swelling  or leg swelling, presyncope, palpitations, abdominal pain, anorexia, nausea, vomiting, diarrhea  or change in bowel habits or change in bladder habits, change in stools or change in urine, dysuria, hematuria,  rash, arthralgias, visual complaints, headache, numbness, weakness or ataxia or problems with walking or coordination,  change in mood or  memory.        Current Meds  Medication Sig   albuterol (PROVENTIL) (2.5 MG/3ML) 0.083% nebulizer solution Take 3 mLs (2.5 mg total) by nebulization every 6 (six) hours as needed for wheezing or shortness of breath. J96.01   albuterol (VENTOLIN HFA) 108 (90 Base) MCG/ACT inhaler TAKE 2 PUFFS BY MOUTH EVERY 6 HOURS AS NEEDED FOR WHEEZE OR SHORTNESS OF BREATH   apixaban (ELIQUIS) 5 MG TABS tablet Take 1 tablet (5 mg total) by mouth 2 (two) times daily.    atenolol (TENORMIN) 25 MG tablet Take 1 tablet (25 mg total) by mouth daily.   calcium citrate (CALCITRATE - DOSED IN MG ELEMENTAL CALCIUM) 950 MG tablet Take 1 tablet by mouth 2 (two) times daily.    cyanocobalamin 1000 MCG tablet Take 1,000 mcg by mouth daily. Vitamin b12 every AM   diclofenac Sodium (VOLTAREN) 1 % GEL APPLY 4 G TOPICALLY 4 TIMES DAILY   fluticasone (FLONASE) 50 MCG/ACT nasal spray PLACE 1 SPRAY INTO BOTH NOSTRILS 2 (TWO) TIMES DAILY AS NEEDED FOR ALLERGIES OR RHINITIS.   furosemide (LASIX) 40 MG tablet Take 1 tablet (40 mg total) by mouth daily.   levocetirizine (XYZAL) 5 MG tablet TAKE 1 TABLET BY MOUTH EVERY DAY IN THE EVENING   levothyroxine (SYNTHROID)  112 MCG tablet Take 1 tablet (112 mcg total) by mouth daily.   metFORMIN (GLUCOPHAGE-XR) 500 MG 24 hr tablet Take 1 tablet (500 mg total) by mouth daily with breakfast.   Multiple Vitamins-Minerals (PRESERVISION AREDS 2) CAPS Take 1 capsule by mouth 2 (two) times daily.   Olopatadine HCl 0.2 % SOLN INSTILL 1 DROP INTO BOTH EYES EVERY DAY   omeprazole (PRILOSEC) 20 MG capsule Take 1 capsule (20 mg total) by mouth daily.   OXYGEN Inhale 3 L into the lungs every evening.   Polyethyl Glycol-Propyl Glycol (SYSTANE) 0.4-0.3 % GEL ophthalmic gel Place 1 application into both eyes at bedtime as needed (dry eyes).   predniSONE (DELTASONE) 5 MG tablet Take 1 tablet (5 mg total) by mouth daily with breakfast.   rosuvastatin (CRESTOR) 10 MG tablet TAKE 1 TABLET BY MOUTH EVERYDAY AT BEDTIME   spironolactone (ALDACTONE) 25 MG tablet Take 1 tablet (25 mg total) by mouth daily.   vitamin C (ASCORBIC ACID) 500 MG tablet Take 1,000 mg by mouth daily. AM   Vitamin D, Ergocalciferol, (DRISDOL) 1.25 MG (50000 UNIT) CAPS capsule Take 1 capsule (50,000 Units total) by mouth every 7 (seven) days.                       Past Medical History:  Diagnosis Date   Acute diastolic CHF (congestive heart failure) (HCC) 11/11/2016   Allergic  rhinitis    Anxiety    Anxiety    Arthritis    "~ all my joints" (05/18/2017)   Atrial fibrillation ([facility]) 09/2016   Atrial fibrillation with RVR ([facility]) 05/18/2017   Chronic lower back pain    COPD (chronic obstructive pulmonary disease) (HCC)    Depression    Diverticulosis    Dyspnea    Dysrhythmia    GERD (gastroesophageal reflux disease)    Glaucoma    Gout    Heart disease    History of blood transfusion 1983   "when I gave a 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"   Hyperlipidemia    Hypertension    Hypothyroidism    Macular degeneration of both eyes    Neuropathy    On home oxygen therapy    "3L; 24/7" (05/18/2017)   Pre-diabetes    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    NECK :  without JVD/Nodes/TM/ nl carotid upstrokes bilaterally   LUNGS: no acc muscle use,  Nl contour chest with trace crackles bases  bilaterally without cough on insp or exp maneuvers   CV:  RRR  no s3 or murmur or increase in P2, and no edema   ABD:  soft and nontender with nl inspiratory excursion in the supine position. No bruits or organomegaly appreciated, bowel sounds nl  MS:  Slow gait/ ext warm without deformities, calf tenderness, cyanosis or clubbing No obvious joint restrictions   SKIN: warm and dry without lesions    NEURO:  alert, approp, nl sensorium with  no motor or cerebellar deficits apparent.            Assessment

## 2021-01-09 NOTE — Patient Instructions (Addendum)
I very strongly recommend you get the moderna or pfizer vaccine as soon as possible based on your risk of dying from the virus  and the proven safety and benefit of these vaccines against even the delta and omicron variants.  This can save your life as well as  those of your loved ones,  especially if they are also not vaccinated.     Plan A  = automatic = Prednisone 5 mg each with breakfast    Plan B = Backup (to supplement plan A, not to replace it) Only use your albuterol inhaler as a rescue medication to be used if you can't catch your breath by resting or doing a relaxed purse lip breathing pattern.  - The less you use it, the better it will work when you need it. - Ok to use the inhaler up to 2 puffs  every 4 hours if you must but call for appointment if use goes up over your usual need - Don't leave home without it !!  (think of it like the spare tire for your car)   Plan C = Crisis (instead of Plan B but only if Plan B stops working) - only use your albuterol nebulizer if you first try Plan B and it fails to help > ok to use the nebulizer up to every 4 hours but if start needing it regularly call for immediate appointment   Plan D =  Double the prednisone  - double it if needing place C (the nebulizer)  much at all or worse breathing or coughing or wheezing or arthritis and once better for a week drop back to 5 mg daily    Make sure you check your oxygen saturation at highest level of activity to be sure it stays over 90% and adjust  02 flow upward to maintain this level if needed but remember to turn it back to previous settings when you stop (to conserve your supply).    Please schedule a follow up visit in 6  months but call sooner if needed

## 2021-01-10 ENCOUNTER — Encounter: Payer: Self-pay | Admitting: Internal Medicine

## 2021-01-10 NOTE — Assessment & Plan Note (Addendum)
PFT's  03/26/17    FVC  1.96 (62 % ) s obstruction DLCO  15.01 (53%) corrects to  Chest HRCT  05/09/19  Pulmonary parenchymal pattern of fibrosis appears grossly stable from 03/23/2018 and may be due to usual interstitial pneumonitis or nonspecific interstitial pneumonitis. Findings are indeterminate for UIP per consensus guidelines  4.22 (81%)  for alv volume and FV curve minimally concave and ERV 17%  -  03/19/2020   Walked RA  approx   100 ft  @ very slow pace  stopped due to  Legs gave out first, sats 94%   - 01/09/2021   Walked on RA x  2  lap(s) =  approx 300 ft @ slow/rollator pace, stopped due to legs gave out/ mild sob  with lowest 02 sats 95%   Working dx is RA ILD which is steroid resp/ dependent.    The goal with a chronic steroid dependent illness is always arriving at the lowest effective dose that controls the disease/symptoms and not accepting a set "formula" which is based on statistics or guidelines that don't always take into account patient  variability or the natural hx of the dz in every individual patient, which may well vary over time.  For now therefore I recommend the patient maintain   5 mg floor and 10 mg ceiling and stay as active as possible : specifically : Make sure you check your oxygen saturation at your highest level of activity to be sure it stays over 90% and keep track of it at least once a week, more often if breathing getting worse, and let me know if losing ground.   Also rec Covid vaccination   F/u q 6 m

## 2021-01-10 NOTE — Assessment & Plan Note (Addendum)
pfts s obst 04/05/17  and pt never smoked so this is not copd  -03/19/2020  After extensive coaching inhaler device,  effectiveness =    75% with elipta  So try breo 200 for mild flare than maintain on breo 100 to prevent future flares > did not continue, no change in symptoms  Very little to suggest AB here but RA can cause bronchiolitis which may be partly saba responsive already getting daily pred so rec saba prn 0  see avs for instructions unique to this ov  Each maintenance medication was reviewed in detail including emphasizing most importantly the difference between maintenance and prns and under what circumstances the prns are to be triggered using an action plan format where appropriate.  Total time for H and P, chart review, counseling, reviewing 02/hfa/neb device(s) , directly observing portions of ambulatory 02 saturation study/ and generating customized AVS unique to this office visit / same day charting = 30 min

## 2021-01-11 ENCOUNTER — Other Ambulatory Visit: Payer: Self-pay | Admitting: Family

## 2021-01-17 ENCOUNTER — Other Ambulatory Visit: Payer: Self-pay | Admitting: Family

## 2021-01-17 MED ORDER — CEPHALEXIN 250 MG PO CAPS: 250.0000 mg | ORAL_CAPSULE | Freq: Every day | ORAL | 1 refills | Status: DC

## 2021-01-18 ENCOUNTER — Other Ambulatory Visit: Payer: Self-pay | Admitting: Family

## 2021-01-18 DIAGNOSIS — J019 Acute sinusitis, unspecified: Secondary | ICD-10-CM

## 2021-01-24 ENCOUNTER — Other Ambulatory Visit: Payer: Self-pay | Admitting: Family

## 2021-01-25 DIAGNOSIS — J449 Chronic obstructive pulmonary disease, unspecified: Secondary | ICD-10-CM | POA: Diagnosis not present

## 2021-01-25 DIAGNOSIS — J8410 Pulmonary fibrosis, unspecified: Secondary | ICD-10-CM | POA: Diagnosis not present

## 2021-01-25 DIAGNOSIS — I509 Heart failure, unspecified: Secondary | ICD-10-CM | POA: Diagnosis not present

## 2021-02-04 ENCOUNTER — Other Ambulatory Visit: Payer: Self-pay | Admitting: Family

## 2021-02-05 ENCOUNTER — Ambulatory Visit: Payer: Medicare Other | Admitting: Urology

## 2021-02-05 DIAGNOSIS — N390 Urinary tract infection, site not specified: Secondary | ICD-10-CM

## 2021-02-12 ENCOUNTER — Telehealth: Payer: Self-pay | Admitting: Family

## 2021-02-12 DIAGNOSIS — K219 Gastro-esophageal reflux disease without esophagitis: Secondary | ICD-10-CM

## 2021-02-12 MED ORDER — OMEPRAZOLE 20 MG PO CPDR: 20.0000 mg | DELAYED_RELEASE_CAPSULE | Freq: Every day | ORAL | 1 refills | Status: DC

## 2021-02-12 NOTE — Telephone Encounter (Signed)
Omeprazole Prescription sent to pharmacy

## 2021-02-12 NOTE — Telephone Encounter (Signed)
Not on medication list Please review and advise

## 2021-02-12 NOTE — Telephone Encounter (Signed)
Aware. 

## 2021-02-12 NOTE — Telephone Encounter (Signed)
Pts daughter called to let Alyse Low know that pt is out of her stomach medicine and needs refills called in..  Says she thought the medicine was called Ondansetron but may have changed to something else.  Please advise and call [name] at [phone]

## 2021-02-13 ENCOUNTER — Telehealth: Payer: Self-pay | Admitting: Family

## 2021-02-14 MED ORDER — ONDANSETRON 4 MG PO TBDP
4.0000 mg | ORAL_TABLET | Freq: Three times a day (TID) | ORAL | 0 refills | Status: DC | PRN
Start: 2021-02-14 — End: 2023-01-12

## 2021-02-14 NOTE — Telephone Encounter (Signed)
Pt requesting refill on Ondansetron for nausea, not on current med list Please advise

## 2021-02-14 NOTE — Addendum Note (Signed)
Addended by: Loman Brooklyn on: 02/14/2021 05:02 PM   Modules accepted: Orders

## 2021-02-14 NOTE — Telephone Encounter (Signed)
Rx'd Zofran.

## 2021-02-15 ENCOUNTER — Other Ambulatory Visit: Payer: Self-pay | Admitting: Family

## 2021-02-25 DIAGNOSIS — I509 Heart failure, unspecified: Secondary | ICD-10-CM | POA: Diagnosis not present

## 2021-02-25 DIAGNOSIS — J449 Chronic obstructive pulmonary disease, unspecified: Secondary | ICD-10-CM | POA: Diagnosis not present

## 2021-02-25 DIAGNOSIS — J8410 Pulmonary fibrosis, unspecified: Secondary | ICD-10-CM | POA: Diagnosis not present

## 2021-03-05 ENCOUNTER — Ambulatory Visit (INDEPENDENT_AMBULATORY_CARE_PROVIDER_SITE_OTHER): Payer: Medicare Other

## 2021-03-05 DIAGNOSIS — I442 Atrioventricular block, complete: Secondary | ICD-10-CM

## 2021-03-05 LAB — CUP PACEART REMOTE DEVICE CHECK
Battery Remaining Longevity: 40 mo
Battery Voltage: 2.87 V
Brady Statistic AP VP Percent: 0.41 %
Brady Statistic AP VS Percent: 95.02 %
Brady Statistic AS VP Percent: 0 %
Brady Statistic AS VS Percent: 4.57 %
Brady Statistic RA Percent Paced: 99.22 %
Brady Statistic RV Percent Paced: 0.41 %
Date Time Interrogation Session: 20221115003725
Implantable Lead Implant Date: 20190128
Implantable Lead Implant Date: 20190128
Implantable Lead Location: 753860
Implantable Lead Location: 753860
Implantable Lead Model: 3830
Implantable Lead Model: 5076
Implantable Pulse Generator Implant Date: 20190128
Lead Channel Impedance Value: 304 Ohm
Lead Channel Impedance Value: 380 Ohm
Lead Channel Impedance Value: 399 Ohm
Lead Channel Impedance Value: 437 Ohm
Lead Channel Sensing Intrinsic Amplitude: 12.25 mV
Lead Channel Sensing Intrinsic Amplitude: 12.25 mV
Lead Channel Sensing Intrinsic Amplitude: 2.625 mV
Lead Channel Sensing Intrinsic Amplitude: 2.625 mV
Lead Channel Setting Pacing Amplitude: 2.5 V
Lead Channel Setting Pacing Amplitude: 3.5 V
Lead Channel Setting Pacing Pulse Width: 0.4 ms
Lead Channel Setting Sensing Sensitivity: 0.9 mV

## 2021-03-12 NOTE — Progress Notes (Signed)
Remote pacemaker transmission.   

## 2021-03-27 ENCOUNTER — Other Ambulatory Visit: Payer: Self-pay | Admitting: Family

## 2021-03-27 ENCOUNTER — Ambulatory Visit (INDEPENDENT_AMBULATORY_CARE_PROVIDER_SITE_OTHER): Payer: Medicare Other

## 2021-03-27 VITALS — Ht 67.0 in | Wt 203.0 lb

## 2021-03-27 DIAGNOSIS — R1013 Epigastric pain: Secondary | ICD-10-CM | POA: Insufficient documentation

## 2021-03-27 DIAGNOSIS — J449 Chronic obstructive pulmonary disease, unspecified: Secondary | ICD-10-CM

## 2021-03-27 DIAGNOSIS — I509 Heart failure, unspecified: Secondary | ICD-10-CM | POA: Diagnosis not present

## 2021-03-27 DIAGNOSIS — I152 Hypertension secondary to endocrine disorders: Secondary | ICD-10-CM

## 2021-03-27 DIAGNOSIS — A09 Infectious gastroenteritis and colitis, unspecified: Secondary | ICD-10-CM | POA: Insufficient documentation

## 2021-03-27 DIAGNOSIS — Z860100 Personal history of colon polyps, unspecified: Secondary | ICD-10-CM | POA: Insufficient documentation

## 2021-03-27 DIAGNOSIS — K3189 Other diseases of stomach and duodenum: Secondary | ICD-10-CM | POA: Insufficient documentation

## 2021-03-27 DIAGNOSIS — E1159 Type 2 diabetes mellitus with other circulatory complications: Secondary | ICD-10-CM

## 2021-03-27 DIAGNOSIS — E1142 Type 2 diabetes mellitus with diabetic polyneuropathy: Secondary | ICD-10-CM

## 2021-03-27 DIAGNOSIS — R195 Other fecal abnormalities: Secondary | ICD-10-CM | POA: Insufficient documentation

## 2021-03-27 DIAGNOSIS — R634 Abnormal weight loss: Secondary | ICD-10-CM | POA: Insufficient documentation

## 2021-03-27 DIAGNOSIS — J8410 Pulmonary fibrosis, unspecified: Secondary | ICD-10-CM | POA: Diagnosis not present

## 2021-03-27 DIAGNOSIS — Z0000 Encounter for general adult medical examination without abnormal findings: Secondary | ICD-10-CM | POA: Diagnosis not present

## 2021-03-27 DIAGNOSIS — D500 Iron deficiency anemia secondary to blood loss (chronic): Secondary | ICD-10-CM | POA: Insufficient documentation

## 2021-03-27 DIAGNOSIS — D649 Anemia, unspecified: Secondary | ICD-10-CM | POA: Insufficient documentation

## 2021-03-27 NOTE — Progress Notes (Signed)
Subjective:   Lorraine Leonard is a 80 y.o. female who presents for Medicare Annual (Subsequent) preventive examination.  Virtual Visit via Telephone Note  I connected with  Lorraine Leonard on 03/27/21 at  1:15 PM EST by telephone and verified that I am speaking with the correct person using two identifiers.  Location: Patient: Home Provider: [facility] Persons participating in the virtual visit: [name]   I discussed the limitations, risks, security and privacy concerns of performing an evaluation and management service by telephone and the availability of in person appointments. The patient expressed understanding and agreed to proceed.  Interactive audio and video telecommunications were attempted between this nurse and patient, however failed, due to patient having technical difficulties OR patient did not have access to video capability.  We continued and completed visit with audio only.  Some vital signs may be absent or patient reported.   Lorraine Leonard E Lorraine Flagg, LPN   Review of Systems     Cardiac Risk Factors include: advanced age (>60men, >68 women);sedentary lifestyle;obesity (BMI >30kg/m2);hypertension;dyslipidemia;Other (see comment), Risk factor comments: atherosclerosis, OSA - doesn't use CPAP, chronic respiratory failure, A.Fib, CHF     Objective:    Today's Vitals   03/27/21 1316  Weight: 203 lb (92.1 kg)  Height: 5\' 7"  (1.702 m)   Body mass index is 31.79 kg/m.  Advanced Directives 03/27/2021 07/08/2020 07/07/2020 07/01/2020 03/26/2020 12/01/2018 07/19/2018  Does Patient Have a Medical Advance Directive? No No Yes No No No No  Would patient like information on creating a medical advance directive? No - Patient declined No - Patient declined - No - Patient declined No - Patient declined Yes (MAU/Ambulatory/Procedural Areas - Information given) No - Patient declined    Current Medications (verified) Outpatient Encounter Medications as of 03/27/2021  Medication  Sig   albuterol (PROVENTIL) (2.5 MG/3ML) 0.083% nebulizer solution Take 3 mLs (2.5 mg total) by nebulization every 6 (six) hours as needed for wheezing or shortness of breath. J96.01   albuterol (VENTOLIN HFA) 108 (90 Base) MCG/ACT inhaler TAKE 2 PUFFS BY MOUTH EVERY 6 HOURS AS NEEDED FOR WHEEZE OR SHORTNESS OF BREATH   apixaban (ELIQUIS) 5 MG TABS tablet Take 1 tablet (5 mg total) by mouth 2 (two) times daily.   atenolol (TENORMIN) 25 MG tablet Take 1 tablet (25 mg total) by mouth daily.   calcium citrate (CALCITRATE - DOSED IN MG ELEMENTAL CALCIUM) 950 MG tablet Take 1 tablet by mouth 2 (two) times daily.    cyanocobalamin 1000 MCG tablet Take 1,000 mcg by mouth daily. Vitamin b12 every AM   diclofenac Sodium (VOLTAREN) 1 % GEL APPLY 4 GRAMS TOPICALLY 4 TIMES A DAY   fluticasone (FLONASE) 50 MCG/ACT nasal spray PLACE 1 SPRAY INTO BOTH NOSTRILS 2 (TWO) TIMES DAILY AS NEEDED FOR ALLERGIES OR RHINITIS.   furosemide (LASIX) 40 MG tablet Take 1 tablet (40 mg total) by mouth daily.   levocetirizine (XYZAL) 5 MG tablet TAKE 1 TABLET BY MOUTH EVERY DAY IN THE EVENING   levothyroxine (SYNTHROID) 112 MCG tablet TAKE 1 TABLET BY MOUTH EVERY DAY   metFORMIN (GLUCOPHAGE-XR) 500 MG 24 hr tablet Take 1 tablet (500 mg total) by mouth daily with breakfast.   Multiple Vitamins-Minerals (PRESERVISION AREDS 2) CAPS Take 1 capsule by mouth 2 (two) times daily.   Olopatadine HCl 0.2 % SOLN INSTILL 1 DROP INTO BOTH EYES EVERY DAY   omeprazole (PRILOSEC) 20 MG capsule Take 1 capsule (20 mg total) by mouth daily.  ondansetron (ZOFRAN ODT) 4 MG disintegrating tablet Take 1 tablet (4 mg total) by mouth every 8 (eight) hours as needed for nausea or vomiting.   OXYGEN Inhale 3 L into the lungs every evening.   Polyethyl Glycol-Propyl Glycol (SYSTANE) 0.4-0.3 % GEL ophthalmic gel Place 1 application into both eyes at bedtime as needed (dry eyes).   predniSONE (DELTASONE) 5 MG tablet Take 1 tablet (5 mg total) by mouth daily  with breakfast.   rosuvastatin (CRESTOR) 10 MG tablet TAKE 1 TABLET BY MOUTH EVERYDAY AT BEDTIME   spironolactone (ALDACTONE) 25 MG tablet TAKE 1 TABLET (25 MG TOTAL) BY MOUTH DAILY.   vitamin C (ASCORBIC ACID) 500 MG tablet Take 1,000 mg by mouth daily. AM   Vitamin D, Ergocalciferol, (DRISDOL) 1.25 MG (50000 UNIT) CAPS capsule Take 1 capsule (50,000 Units total) by mouth every 7 (seven) days.   [DISCONTINUED] cephALEXin (KEFLEX) 250 MG capsule Take 1 capsule (250 mg total) by mouth daily. (Patient not taking: Reported on 03/27/2021)   No facility-administered encounter medications on file as of 03/27/2021.    Allergies (verified) Allopurinol and Mobic [meloxicam]   History: Past Medical History:  Diagnosis Date   Acute diastolic CHF (congestive heart failure) (HCC) 11/11/2016   Allergic rhinitis    Anxiety    Anxiety    Arthritis    "~ all my joints" (05/18/2017)   Atrial fibrillation ([place]) 09/2016   Atrial fibrillation with RVR (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   Pulmonary fibrosis (HCC)    Pulmonary fibrosis (HCC)    Rheumatoid arthritis (HCC)    Sleep apnea    uses 3 liters of o2 24/7   Urticaria    Past Surgical History:  Procedure Laterality Date   AV NODE ABLATION  05/18/2017   AV NODE ABLATION N/A 05/18/2017   Procedure: AV NODE ABLATION;  Surgeon: Evans Lance, MD;  Location: [facility];  Service: Cardiovascular;  Laterality: N/A;   BACK SURGERY      BREAST LUMPECTOMY Left    CARDIOVERSION N/A 01/30/2017   Procedure: CARDIOVERSION;  Surgeon: Josue Hector, MD;  [facility];  Service: Cardiovascular;  Laterality: N/A;   CARDIOVERSION N/A 04/09/2017   Procedure: CARDIOVERSION;  Surgeon: Satira Sark, MD;  [facility];  Service: Cardiovascular;  Laterality: N/A;   CATARACT EXTRACTION W/ INTRAOCULAR LENS  IMPLANT, BILATERAL Bilateral    DILATION AND CURETTAGE OF UTERUS     EUS N/A 10/03/2015   Procedure: UPPER ENDOSCOPIC ULTRASOUND (EUS) RADIAL;  Surgeon: Arta Silence, MD;  Location: [facility];  Service: Endoscopy;  Laterality: N/A;   FINE NEEDLE ASPIRATION N/A 10/03/2015   Procedure: FINE NEEDLE ASPIRATION (FNA) RADIAL;  Surgeon: Arta Silence, MD;  Location: [facility];  Service: Endoscopy;  Laterality: N/A;   INSERT / REPLACE / REMOVE PACEMAKER  05/18/2017   LUMBAR LAMINECTOMY  1995; 06/23/2008   L4-5/notes 08/20/2010   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   NEPHRECTOMY TRANSPLANTED ORGAN     PACEMAKER IMPLANT N/A 05/18/2017   Procedure: PACEMAKER IMPLANT;  Surgeon: Evans Lance, MD;  Location: [facility];  Service: Cardiovascular;  Laterality: N/A;   TOTAL ABDOMINAL HYSTERECTOMY     TUBAL LIGATION     Family History  Problem Relation Age of Onset   Diabetes Other    Hypertension Other    Coronary artery disease Other    Stroke Other    Asthma Other    Kidney disease Sister    Cancer Sister        lung   Lung cancer Sister    Cancer Sister        lung   Lung cancer Sister    Cancer Sister        lung   Suicidality Son        committed suicide in 2009   CVA Son    Hyperlipidemia Other    Anxiety disorder Other    Migraines Other    Heart failure Father    Healthy Brother    Cancer Sister        lung   Healthy Sister    Social History   Socioeconomic History   Marital status: Widowed    Spouse name: Mortimer Fries   Number of children: 4   Years of  education: Not on file   Highest education level: 9th grade  Occupational History   Occupation: retired    Comment: cna  Tobacco Use   Smoking status: Never   Smokeless tobacco: Never  Vaping Use   Vaping Use: Never used  Substance and Sexual Activity   Alcohol use: No   Drug use: No   Sexual activity: Not Currently    Birth control/protection: Surgical  Other Topics Concern   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   Pt lives alone in single wide trailer. They have 4 children and 13 grandchildren.    Social Determinants of Health   Financial Resource Strain: Low Risk    Difficulty of Paying Living Expenses: Not hard at all  Food Insecurity: No Food Insecurity   Worried About Charity fundraiser in the Last Year: Never true   [place] in the Last Year: Never true  Transportation Needs: No Transportation Needs   Lack of Transportation (Medical): No   Lack of Transportation (Non-Medical): No  Physical Activity: Inactive   Days of Exercise per Week: 0 days   Minutes of Exercise per Session: 0 min  Stress: Stress Concern Present   Feeling of Stress : To some extent  Social Connections: Socially Isolated   Frequency of Communication with Friends and Family: More than three times a week   Frequency of Social Gatherings with Friends and Family: More than three times a week   Attends Religious Services: Never   Marine scientist or Organizations: No   Attends Archivist Meetings: Never   Marital Status: Widowed    Tobacco Counseling Counseling given: Not Answered   Clinical Intake:  Pre-visit preparation completed: Yes  Pain : No/denies pain     BMI - recorded: 31.79 Nutritional Status: BMI > 30  Obese Nutritional Risks: Unintentional weight gain Diabetes: Yes  CBG done?: No Did pt. bring in CBG monitor from home?: No  How often do you need to have someone help you when  you read instructions, pamphlets, or other written materials from your doctor or pharmacy?: 1 - Never  Diabetic? Nutrition Risk Assessment:  Has the patient had any N/V/D within the last 2 months?  Yes  Does the patient have any non-healing wounds?  No  Has the patient had any unintentional weight loss or weight gain?  No   Diabetes:  Is the patient diabetic?  Yes  If diabetic, was a CBG obtained today?  No  Did the patient bring in their glucometer from home?  No  How often do you monitor your CBG's? never.   Financial Strains and Diabetes Management:  Are you having any financial strains with the device, your supplies or your medication? No .  Does the patient want to be seen by Chronic Care Management for management of their diabetes?  No  Would the patient like to be referred to a Nutritionist or for Diabetic Management?  No   Diabetic Exams:  Diabetic Eye Exam: Completed 12/2020.   Diabetic Foot Exam: Completed 02/24/2020. Pt has been advised about the importance in completing this exam. Pt is scheduled for diabetic foot exam on 04/30/2021.    Interpreter Needed?: No  Information entered by :: Kinnley Paulson, LPN   Activities of Daily Living In your present state of health, do you have any difficulty performing the following activities: 03/27/2021 07/08/2020  Hearing? Y N  Comment in right ear - declines hearing aid -  Vision? Y N  Comment macular degeneration -  Difficulty concentrating or making decisions? Tempie Donning  Walking or climbing stairs? Y Y  Dressing or bathing? N N  Doing errands, shopping? Tempie Donning  Preparing Food and eating ? N -  Using the Toilet? N -  In the past six months, have you accidently leaked urine? Y -  Comment wears depends, but usually can make it to bathroom -  Do you have problems with loss of bowel control? Y -  Comment wears depends, but usually can make it to bathroom -  Managing your Medications? Y -  Comment she forgets - her granddaughter sets  them up for her -  Managing your Finances? N -  Housekeeping or managing your Housekeeping? Y -  Some recent data might be hidden    Patient Care Team: Sharion Balloon, FNP as PCP - General (Family Medicine) Evans Lance, MD as PCP - Electrophysiology (Cardiology) Harl Bowie Alphonse Guild, MD as PCP - Cardiology (Cardiology) Tanda Rockers, MD as Consulting Physician (Pulmonary Disease)  Indicate any recent Medical Services you may have received from other than [facility] providers in the past year (date may be approximate).     Assessment:   This is a routine wellness examination for Lorraine Leonard.  Hearing/Vision screen Hearing Screening - Comments:: Moderate hearing difficulty in right ear only.  Vision Screening - Comments:: Wears rx glasses- up to date with annual eye exams with [facility] and Dr Zigmund Daniel  Dietary issues and exercise activities discussed: Current Exercise Habits: Home exercise routine, Time (Minutes): 10, Frequency (Times/Week): 7, Weekly Exercise (Minutes/Week): 70, Intensity: Mild, Exercise limited by: neurologic condition(s);orthopedic condition(s);psychological condition(s);respiratory conditions(s);cardiac condition(s)   Goals Addressed             This Visit's Progress    Exercise 150 min/wk Moderate Activity   Not on track    Chair exercises  daily. Handout given.        Depression Screen PHQ 2/9 Scores 03/27/2021 10/30/2020 08/09/2020 07/20/2020 03/26/2020 02/24/2020 08/30/2019  PHQ - 2 Score 2 2 6 6 6 6 2   PHQ- 9 Score 7 10 15 16  - 16 6    Fall Risk Fall Risk  03/27/2021 10/30/2020 08/09/2020 07/20/2020 03/26/2020  Falls in the past year? 1 1 1  0 1  Number falls in past yr: 0 1 1 - 0  Injury with Fall? 0 1 1 - 0  Comment - - - - -  Risk for fall due to : History of fall(s);Impaired balance/gait;Impaired vision;Orthopedic patient History of fall(s) Impaired mobility;Impaired balance/gait;History of fall(s) - Impaired balance/gait  Follow up Education  provided;Falls prevention discussed Falls evaluation completed Falls evaluation completed - Falls evaluation completed    FALL RISK PREVENTION PERTAINING TO THE HOME:  Any stairs in or around the home? No  If so, are there any without handrails? No  Home free of loose throw rugs in walkways, pet beds, electrical cords, etc? Yes  Adequate lighting in your home to reduce risk of falls? Yes   ASSISTIVE DEVICES UTILIZED TO PREVENT FALLS:  Life alert? No  Use of a cane, walker or w/c? Yes  Grab bars in the bathroom? Yes  Shower chair or bench in shower? Yes  Elevated toilet seat or a handicapped toilet? Yes   TIMED UP AND GO:  Was the test performed? No . Telephonic visit  Cognitive Function: MMSE - Mini Mental State Exam 06/16/2017  Orientation to time 5  Orientation to Place 5  Registration 3  Attention/ Calculation 5  Recall 3  Language- name 2 objects 2  Language- repeat 1  Language- follow 3 step command 3  Language- read & follow direction 1  Write a sentence 1  Copy design 1  Total score 30     6CIT Screen 03/27/2021 03/26/2020 12/01/2018  What Year? 0 points 0 points 0 points  What month? 0 points 0 points 0 points  What time? 0 points 0 points 0 points  Count back from 20 0 points 0 points 2 points  Months in reverse 0 points 0 points 2 points  Repeat phrase 4 points 2 points 4 points  Total Score 4 2 8     Immunizations Immunization History  Administered Date(s) Administered   Fluad Quad(high Dose 65+) 01/27/2019, 01/05/2020, 01/09/2021   Influenza Split 01/20/2011, 12/22/2011   Influenza, High Dose Seasonal PF 01/25/2014, 01/24/2015, 01/01/2016, 02/26/2018   Influenza-Unspecified 01/19/2013   Pneumococcal Conjugate-13 03/01/2015   Pneumococcal Polysaccharide-23 04/21/2009   Tdap 08/30/2019   Zoster Recombinat (Shingrix) 09/03/2016, 06/18/2017, 12/15/2017   Zoster, Live 10/20/2010    TDAP status: Up to date  Flu Vaccine status: Up to  date  Pneumococcal vaccine status: Up to date  Covid-19 vaccine status: Declined, Education has been provided regarding the importance of this vaccine but patient still declined. Advised may receive this vaccine at local pharmacy or Health Dept.or vaccine clinic. Aware to provide a copy of the vaccination record if obtained from local pharmacy or Health Dept. Verbalized acceptance and understanding.  Qualifies for Shingles Vaccine? Yes   Zostavax completed Yes   Shingrix Completed?: Yes  Screening Tests Health Maintenance  Topic Date Due   COVID-19 Vaccine (1) Never done   OPHTHALMOLOGY EXAM  01/07/2020   URINE MICROALBUMIN  08/29/2020   FOOT EXAM  02/23/2021   HEMOGLOBIN A1C  05/02/2021   TETANUS/TDAP  08/29/2029   Pneumonia Vaccine  36+ Years old  Completed   INFLUENZA VACCINE  Completed   DEXA SCAN  Completed   Zoster Vaccines- Shingrix  Completed   HPV VACCINES  Aged Out    Health Maintenance  Health Maintenance Due  Topic Date Due   COVID-19 Vaccine (1) Never done   OPHTHALMOLOGY EXAM  01/07/2020   URINE MICROALBUMIN  08/29/2020   FOOT EXAM  02/23/2021    Colorectal cancer screening: No longer required.   Mammogram status: No longer required due to age.  Bone Density - declines  Lung Cancer Screening: (Low Dose CT Chest recommended if Age 54-80 years, 30 pack-year currently smoking OR have quit w/in 15years.) does not qualify.  Additional Screening:  Hepatitis C Screening: does not qualify  Vision Screening: Recommended annual ophthalmology exams for early detection of glaucoma and other disorders of the eye. Is the patient up to date with their annual eye exam?  Yes  Who is the provider or what is the name of the office in which the patient attends annual eye exams? [facility] If pt is not established with a provider, would they like to be referred to a provider to establish care? No .   Dental Screening: Recommended annual dental exams for proper oral  hygiene  Community Resource Referral / Chronic Care Management: CRR required this visit?  No   CCM required this visit?  No      Plan:     I have personally reviewed and noted the following in the patient's chart:   Medical and social history Use of alcohol, tobacco or illicit drugs  Current medications and supplements including opioid prescriptions.  Functional ability and status Nutritional status Physical activity Advanced directives List of other physicians Hospitalizations, surgeries, and ER visits in previous 12 months Vitals Screenings to include cognitive, depression, and falls Referrals and appointments  In addition, I have reviewed and discussed with patient certain preventive protocols, quality metrics, and best practice recommendations. A written personalized care plan for preventive services as well as general preventive health recommendations were provided to patient.     Sandrea Hammond, LPN   [id number]/06/7856   Nurse Notes: None

## 2021-03-27 NOTE — Patient Instructions (Signed)
Lorraine Leonard , Thank you for taking time to come for your Medicare Wellness Visit. I appreciate your ongoing commitment to your health goals. Please review the following plan we discussed and let me know if I can assist you in the future.   Screening recommendations/referrals: Colonoscopy: No longer required Mammogram: No longer required - still may do every 1-2 years Bone Density: Not required - recommended every 2 years Recommended yearly ophthalmology/optometry visit for glaucoma screening and checkup Recommended yearly dental visit for hygiene and checkup  Vaccinations: Influenza vaccine: Done 01/09/2021 - Repeat annually  Pneumococcal vaccine: Done 2011 & 2016 Tdap vaccine: Done 08/30/2019 - Repeat in 10 years  Shingles vaccine: Done 06/18/2017 & 12/15/2017   Covid-19: Declined  Advanced directives: Advance directive discussed with you today. Even though you declined this today, please call our office should you change your mind, and we can give you the proper paperwork for you to fill out.   Conditions/risks identified: Aim for 30 minutes of exercise each day, seated stretches, leg raises, etc are good options, drink 6-8 glasses of water and eat lots of fruits and vegetables.   Next appointment: Follow up in one year for your annual wellness visit    Preventive Care 65 Years and Older, Female Preventive care refers to lifestyle choices and visits with your health care provider that can promote health and wellness. What does preventive care include? A yearly physical exam. This is also called an annual well check. Dental exams once or twice a year. Routine eye exams. Ask your health care provider how often you should have your eyes checked. Personal lifestyle choices, including: Daily care of your teeth and gums. Regular physical activity. Eating a healthy diet. Avoiding tobacco and drug use. Limiting alcohol use. Practicing safe sex. Taking low-dose aspirin every day. Taking  vitamin and mineral supplements as recommended by your health care provider. What happens during an annual well check? The services and screenings done by your health care provider during your annual well check will depend on your age, overall health, lifestyle risk factors, and family history of disease. Counseling  Your health care provider may ask you questions about your: Alcohol use. Tobacco use. Drug use. Emotional well-being. Home and relationship well-being. Sexual activity. Eating habits. History of falls. Memory and ability to understand (cognition). Work and work Statistician. Reproductive health. Screening  You may have the following tests or measurements: Height, weight, and BMI. Blood pressure. Lipid and cholesterol levels. These may be checked every 5 years, or more frequently if you are over 52 years old. Skin check. Lung cancer screening. You may have this screening every year starting at age 28 if you have a 30-pack-year history of smoking and currently smoke or have quit within the past 15 years. Fecal occult blood test (FOBT) of the stool. You may have this test every year starting at age 41. Flexible sigmoidoscopy or colonoscopy. You may have a sigmoidoscopy every 5 years or a colonoscopy every 10 years starting at age 74. Hepatitis C blood test. Hepatitis B blood test. Sexually transmitted disease (STD) testing. Diabetes screening. This is done by checking your blood sugar (glucose) after you have not eaten for a while (fasting). You may have this done every 1-3 years. Bone density scan. This is done to screen for osteoporosis. You may have this done starting at age 30. Mammogram. This may be done every 1-2 years. Talk to your health care provider about how often you should have regular mammograms. Talk with  your health care provider about your test results, treatment options, and if necessary, the need for more tests. Vaccines  Your health care provider may  recommend certain vaccines, such as: Influenza vaccine. This is recommended every year. Tetanus, diphtheria, and acellular pertussis (Tdap, Td) vaccine. You may need a Td booster every 10 years. Zoster vaccine. You may need this after age 26. Pneumococcal 13-valent conjugate (PCV13) vaccine. One dose is recommended after age 74. Pneumococcal polysaccharide (PPSV23) vaccine. One dose is recommended after age 87. Talk to your health care provider about which screenings and vaccines you need and how often you need them. This information is not intended to replace advice given to you by your health care provider. Make sure you discuss any questions you have with your health care provider. Document Released: 05/04/2015 Document Revised: 12/26/2015 Document Reviewed: 02/06/2015 Elsevier Interactive Patient Education  2017 [place] Prevention in the Home Falls can cause injuries. They can happen to people of all ages. There are many things you can do to make your home safe and to help prevent falls. What can I do on the outside of my home? Regularly fix the edges of walkways and driveways and fix any cracks. Remove anything that might make you trip as you walk through a door, such as a raised step or threshold. Trim any bushes or trees on the path to your home. Use bright outdoor lighting. Clear any walking paths of anything that might make someone trip, such as rocks or tools. Regularly check to see if handrails are loose or broken. Make sure that both sides of any steps have handrails. Any raised decks and porches should have guardrails on the edges. Have any leaves, snow, or ice cleared regularly. Use sand or salt on walking paths during winter. Clean up any spills in your garage right away. This includes oil or grease spills. What can I do in the bathroom? Use night lights. Install grab bars by the toilet and in the tub and shower. Do not use towel bars as grab bars. Use non-skid  mats or decals in the tub or shower. If you need to sit down in the shower, use a plastic, non-slip stool. Keep the floor dry. Clean up any water that spills on the floor as soon as it happens. Remove soap buildup in the tub or shower regularly. Attach bath mats securely with double-sided non-slip rug tape. Do not have throw rugs and other things on the floor that can make you trip. What can I do in the bedroom? Use night lights. Make sure that you have a light by your bed that is easy to reach. Do not use any sheets or blankets that are too big for your bed. They should not hang down onto the floor. Have a firm chair that has side arms. You can use this for support while you get dressed. Do not have throw rugs and other things on the floor that can make you trip. What can I do in the kitchen? Clean up any spills right away. Avoid walking on wet floors. Keep items that you use a lot in easy-to-reach places. If you need to reach something above you, use a strong step stool that has a grab bar. Keep electrical cords out of the way. Do not use floor polish or wax that makes floors slippery. If you must use wax, use non-skid floor wax. Do not have throw rugs and other things on the floor that can make you trip.  What can I do with my stairs? Do not leave any items on the stairs. Make sure that there are handrails on both sides of the stairs and use them. Fix handrails that are broken or loose. Make sure that handrails are as long as the stairways. Check any carpeting to make sure that it is firmly attached to the stairs. Fix any carpet that is loose or worn. Avoid having throw rugs at the top or bottom of the stairs. If you do have throw rugs, attach them to the floor with carpet tape. Make sure that you have a light switch at the top of the stairs and the bottom of the stairs. If you do not have them, ask someone to add them for you. What else can I do to help prevent falls? Wear shoes  that: Do not have high heels. Have rubber bottoms. Are comfortable and fit you well. Are closed at the toe. Do not wear sandals. If you use a stepladder: Make sure that it is fully opened. Do not climb a closed stepladder. Make sure that both sides of the stepladder are locked into place. Ask someone to hold it for you, if possible. Clearly mark and make sure that you can see: Any grab bars or handrails. First and last steps. Where the edge of each step is. Use tools that help you move around (mobility aids) if they are needed. These include: Canes. Walkers. Scooters. Crutches. Turn on the lights when you go into a dark area. Replace any light bulbs as soon as they burn out. Set up your furniture so you have a clear path. Avoid moving your furniture around. If any of your floors are uneven, fix them. If there are any pets around you, be aware of where they are. Review your medicines with your doctor. Some medicines can make you feel dizzy. This can increase your chance of falling. Ask your doctor what other things that you can do to help prevent falls. This information is not intended to replace advice given to you by your health care provider. Make sure you discuss any questions you have with your health care provider. Document Released: 02/01/2009 Document Revised: 09/13/2015 Document Reviewed: 05/12/2014 Elsevier Interactive Patient Education  2017 [employer].

## 2021-03-29 ENCOUNTER — Ambulatory Visit: Payer: Medicare Other | Admitting: Cardiology

## 2021-04-11 ENCOUNTER — Encounter: Payer: Self-pay | Admitting: Family Medicine

## 2021-04-11 ENCOUNTER — Ambulatory Visit (INDEPENDENT_AMBULATORY_CARE_PROVIDER_SITE_OTHER): Payer: Medicare Other | Admitting: Family Medicine

## 2021-04-11 DIAGNOSIS — N3001 Acute cystitis with hematuria: Secondary | ICD-10-CM

## 2021-04-11 DIAGNOSIS — R300 Dysuria: Secondary | ICD-10-CM

## 2021-04-11 LAB — URINALYSIS, ROUTINE W REFLEX MICROSCOPIC
Bilirubin, UA: NEGATIVE
Glucose, UA: NEGATIVE
Ketones, UA: NEGATIVE
Nitrite, UA: NEGATIVE
Specific Gravity, UA: 1.01 (ref 1.005–1.030)
Urobilinogen, Ur: 0.2 mg/dL (ref 0.2–1.0)
pH, UA: 6.5 (ref 5.0–7.5)

## 2021-04-11 LAB — MICROSCOPIC EXAMINATION
Epithelial Cells (non renal): NONE SEEN /hpf (ref 0–10)
WBC, UA: >30 /hpf — AB (ref 0–5)

## 2021-04-11 MED ORDER — CEFDINIR 300 MG PO CAPS: 300.0000 mg | ORAL_CAPSULE | Freq: Two times a day (BID) | ORAL | 0 refills | Status: AC

## 2021-04-11 NOTE — Progress Notes (Signed)
Virtual Visit via Telephone Note  I connected with Lorraine Leonard on 04/11/21 at 12:05 PM by telephone and verified that I am speaking with the correct person using two identifiers. Lorraine Leonard is currently located at home and nobody is currently with her during this visit. The provider, Loman Brooklyn, FNP is located in their office at time of visit.  I discussed the limitations, risks, security and privacy concerns of performing an evaluation and management service by telephone and the availability of in person appointments. I also discussed with the patient that there may be a patient responsible charge related to this service. The patient expressed understanding and agreed to proceed.  Subjective: PCP: Sharion Balloon, FNP  Chief Complaint  Patient presents with   Urinary Tract Infection   Patient complains of dysuria and frequency. She has had symptoms for 3 days. Patient denies back pain and fever. Patient does have a history of recurrent UTI.  Patient does not have a history of pyelonephritis. Patient has been taking AZO.   ROS: Per HPI  Current Outpatient Medications:    albuterol (VENTOLIN HFA) 108 (90 Base) MCG/ACT inhaler, TAKE 2 PUFFS BY MOUTH EVERY 6 HOURS AS NEEDED FOR WHEEZE OR SHORTNESS OF BREATH, Disp: 8.5 each, Rfl: 2   atenolol (TENORMIN) 25 MG tablet, TAKE 1 TABLET (25 MG TOTAL) BY MOUTH DAILY., Disp: 90 tablet, Rfl: 1   calcium citrate (CALCITRATE - DOSED IN MG ELEMENTAL CALCIUM) 950 MG tablet, Take 1 tablet by mouth 2 (two) times daily. , Disp: , Rfl:    cyanocobalamin 1000 MCG tablet, Take 1,000 mcg by mouth daily. Vitamin b12 every AM, Disp: , Rfl:    diclofenac Sodium (VOLTAREN) 1 % GEL, APPLY 4 GRAMS TOPICALLY 4 TIMES A DAY, Disp: 400 g, Rfl: 1   ELIQUIS 5 MG TABS tablet, TAKE 1 TABLET BY MOUTH TWICE A DAY, Disp: 180 tablet, Rfl: 1   fluticasone (FLONASE) 50 MCG/ACT nasal spray, PLACE 1 SPRAY INTO BOTH NOSTRILS 2 (TWO) TIMES DAILY AS NEEDED FOR ALLERGIES OR  RHINITIS., Disp: 48 mL, Rfl: 1   furosemide (LASIX) 40 MG tablet, Take 1 tablet (40 mg total) by mouth daily., Disp: 90 tablet, Rfl: 1   levocetirizine (XYZAL) 5 MG tablet, TAKE 1 TABLET BY MOUTH EVERY DAY IN THE EVENING, Disp: 90 tablet, Rfl: 1   levothyroxine (SYNTHROID) 112 MCG tablet, TAKE 1 TABLET BY MOUTH EVERY DAY, Disp: 90 tablet, Rfl: 1   metFORMIN (GLUCOPHAGE-XR) 500 MG 24 hr tablet, TAKE 1 TABLET BY MOUTH EVERY DAY WITH BREAKFAST, Disp: 90 tablet, Rfl: 1   Multiple Vitamins-Minerals (PRESERVISION AREDS 2) CAPS, Take 1 capsule by mouth 2 (two) times daily., Disp: , Rfl:    Olopatadine HCl 0.2 % SOLN, INSTILL 1 DROP INTO BOTH EYES EVERY DAY, Disp: 10 mL, Rfl: 5   omeprazole (PRILOSEC) 20 MG capsule, Take 1 capsule (20 mg total) by mouth daily., Disp: 90 capsule, Rfl: 1   ondansetron (ZOFRAN ODT) 4 MG disintegrating tablet, Take 1 tablet (4 mg total) by mouth every 8 (eight) hours as needed for nausea or vomiting., Disp: 20 tablet, Rfl: 0   OXYGEN, Inhale 3 L into the lungs every evening., Disp: , Rfl:    Polyethyl Glycol-Propyl Glycol (SYSTANE) 0.4-0.3 % GEL ophthalmic gel, Place 1 application into both eyes at bedtime as needed (dry eyes)., Disp: , Rfl:    predniSONE (DELTASONE) 5 MG tablet, Take 1 tablet (5 mg total) by mouth daily with breakfast., Disp:  90 tablet, Rfl: 2   rosuvastatin (CRESTOR) 10 MG tablet, TAKE 1 TABLET BY MOUTH EVERYDAY AT BEDTIME, Disp: 90 tablet, Rfl: 1   spironolactone (ALDACTONE) 25 MG tablet, TAKE 1 TABLET (25 MG TOTAL) BY MOUTH DAILY., Disp: 90 tablet, Rfl: 0   vitamin C (ASCORBIC ACID) 500 MG tablet, Take 1,000 mg by mouth daily. AM, Disp: , Rfl:    Vitamin D, Ergocalciferol, (DRISDOL) 1.25 MG (50000 UNIT) CAPS capsule, Take 1 capsule (50,000 Units total) by mouth every 7 (seven) days., Disp: 12 capsule, Rfl: 1  Allergies  Allergen Reactions   Allopurinol Swelling    Feet and legs swell   Mobic [Meloxicam] Swelling and Other (See Comments)    Feet and Leg  swelling   Past Medical History:  Diagnosis Date   Acute diastolic CHF (congestive heart failure) (HCC) 11/11/2016   Allergic rhinitis    Anxiety    Anxiety    Arthritis    "~ all my joints" (05/18/2017)   Atrial fibrillation ([place]) 09/2016   Atrial fibrillation with RVR ([place]) 05/18/2017   Chronic lower back pain    COPD (chronic obstructive pulmonary disease) (HCC)    Depression    Diverticulosis    Dyspnea    Dysrhythmia    GERD (gastroesophageal reflux disease)    Glaucoma    Gout    Heart disease    History of blood transfusion 1983   "when I gave a 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"   Hyperlipidemia    Hypertension    Hypothyroidism    Macular degeneration of both eyes    Neuropathy    On home oxygen therapy    "3L; 24/7" (05/18/2017)   Pre-diabetes    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 Acute cystitis with hematuria - cefdinir (OMNICEF) 300 MG capsule; Take 1 capsule (300 mg total) by mouth 2 (two) times daily for 7 days.  Dispense: 14 capsule; Refill: 0  2. Dysuria - Urinalysis, Routine w reflex microscopic; Future - Urine dipstick shows positive for RBC's, positive for protein, and positive for leukocytes.  Micro exam: >30 WBC's per HPF, 0-2 RBC's per HPF, and few bacteria - Urine Culture; Future   Follow Up Instructions:  I discussed the assessment and treatment plan with the patient. The patient was provided an opportunity to ask questions and all were answered. The patient agreed with the plan and demonstrated an understanding of the instructions.   The patient was advised to call back or seek an in-person evaluation if the symptoms worsen or if the  condition fails to improve as anticipated.  The above assessment and management plan was discussed with the patient. The patient verbalized understanding of and has agreed to the management plan. Patient is aware to call the clinic if symptoms persist or worsen. Patient is aware when to return to the clinic for a follow-up visit. Patient educated on when it is appropriate to go to the emergency department.   Time call ended: 12:16 PM  I provided 11 minutes of non-face-to-face time during this encounter.  Hendricks Limes, MSN, APRN, FNP-C [facility] 04/11/21

## 2021-04-15 LAB — URINE CULTURE

## 2021-04-15 NOTE — Progress Notes (Signed)
History of Present Illness: Lorraine Leonard is a 80 y.o. year old female Lorraine Leonard is here for routine f/u of recurrent UTI's. Last seen in Nov '21 at which time she was on methenamine as well as perivaginal estrogen cream. 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  She has not been on methenamine because of the cost.  Additionally, she stopped using perivaginal estrogen cream.  Recently treated for urinary tract infection although her culture from the 22nd of this month with essentially negative.  Finishing up cefdinir.  No current dysuria.  No recent fevers, abdominal pain, gross hematuria.    Past Medical History:  Diagnosis Date   Acute diastolic CHF (congestive heart failure) (HCC) 11/11/2016   Allergic rhinitis    Anxiety    Anxiety    Arthritis    "~ all my joints" (05/18/2017)   Atrial fibrillation ([place]) 09/2016   Atrial fibrillation with RVR ([place]) 05/18/2017   Chronic lower back pain    COPD (chronic obstructive pulmonary disease) (HCC)    Depression    Diverticulosis    Dyspnea    Dysrhythmia    GERD (gastroesophageal reflux disease)    Glaucoma    Gout    Heart disease    History of blood transfusion 1983   "when I gave a 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"   Hyperlipidemia    Hypertension    Hypothyroidism    Macular degeneration of both eyes    Neuropathy    On home oxygen therapy    "3L; 24/7" (05/18/2017)   Pre-diabetes    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   Pulmonary fibrosis (HCC)    Pulmonary fibrosis (HCC)    Rheumatoid arthritis (HCC)    Sleep apnea    uses 3 liters of o2 24/7   Urticaria     Past Surgical History:  Procedure Laterality Date   AV NODE ABLATION  05/18/2017   AV NODE ABLATION N/A 05/18/2017   Procedure: AV NODE ABLATION;  Surgeon: Evans Lance, MD;  Location: [facility];  Service: Cardiovascular;  Laterality: N/A;   BACK SURGERY     BREAST LUMPECTOMY Left    CARDIOVERSION N/A 01/30/2017    Procedure: CARDIOVERSION;  Surgeon: Josue Hector, MD;  [facility]: [facility];  Service: Cardiovascular;  Laterality: N/A;   CARDIOVERSION N/A 04/09/2017   Procedure: CARDIOVERSION;  Surgeon: Satira Sark, MD;  Location: [facility];  Service: Cardiovascular;  Laterality: N/A;   CATARACT EXTRACTION W/ INTRAOCULAR LENS  IMPLANT, BILATERAL Bilateral    DILATION AND CURETTAGE OF UTERUS     EUS N/A 10/03/2015   Procedure: UPPER ENDOSCOPIC ULTRASOUND (EUS) RADIAL;  Surgeon: Arta Silence, MD;  Location: [facility];  Service: Endoscopy;  Laterality: N/A;   FINE NEEDLE ASPIRATION N/A 10/03/2015   Procedure: FINE NEEDLE ASPIRATION (FNA) RADIAL;  Surgeon: Arta Silence, MD;  Location: [facility];  Service: Endoscopy;  Laterality: N/A;   INSERT / REPLACE / REMOVE PACEMAKER  05/18/2017   LUMBAR LAMINECTOMY  1995; 06/23/2008   L4-5/notes 08/20/2010   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   NEPHRECTOMY TRANSPLANTED ORGAN     PACEMAKER IMPLANT N/A 05/18/2017   Procedure: PACEMAKER IMPLANT;  Surgeon: Evans Lance, MD;  Location: [facility];  Service: Cardiovascular;  Laterality: N/A;   Lorraine Leonard  Medications:  (Not in a hospital admission)   Allergies:  Allergies  Allergen Reactions   Allopurinol Swelling    Feet and legs swell   Mobic [Meloxicam] Swelling and Other (See Comments)    Feet and Leg swelling    Family History  Problem Relation Age of Onset   Diabetes Other    Hypertension Other    Coronary artery disease Other    Stroke Other    Asthma Other    Kidney disease Sister    Cancer Sister        lung   Lung cancer Sister    Cancer Sister        lung   Lung cancer Sister    Cancer Sister        lung   Suicidality Son        committed suicide in 2009   CVA Son    Hyperlipidemia Other    Anxiety disorder Other    Migraines Other    Heart failure Father    Healthy Brother     Cancer Sister        lung   Healthy Sister     Social History:  reports that she has never smoked. She has never used smokeless tobacco. She reports that she does not drink alcohol and does not use drugs.  ROS: A complete review of systems was performed.  All systems are negative except for pertinent findings as noted.  Physical Exam:  Vital signs in last 24 hours: @VSRANGES @ General:  Alert and oriented, No acute distress HEENT: Normocephalic, atraumatic Neck: No JVD or lymphadenopathy Cardiovascular: Regular rate  Lungs: Normal inspiratory/expiratory excursion Extremities: No edema Neurologic: Grossly intact  I have reviewed prior pt notes  I have reviewed notes from previous physicians  I have reviewed urinalysis results  I have reviewed prior urine culture   Impression/Assessment:  1.  Recurrent urinary tract infections in an 80 year old female.  Currently not on any preventative medication.  2.  Atrophic vaginal changes, not on estrogen cream  Plan:  1.  I gave her a prescription for methenamine, twice daily.  They will get this from another pharmacy using good Rx  2.  Get back on perivaginal estrogen cream  3.  I will see back in 1 year  [name] 04/15/2021, 5:48 PM  [name]. Brynlei Klausner MD

## 2021-04-16 ENCOUNTER — Encounter: Payer: Self-pay | Admitting: Urology

## 2021-04-16 ENCOUNTER — Other Ambulatory Visit: Payer: Self-pay | Admitting: Family

## 2021-04-16 ENCOUNTER — Ambulatory Visit (INDEPENDENT_AMBULATORY_CARE_PROVIDER_SITE_OTHER): Payer: Medicare Other | Admitting: Urology

## 2021-04-16 ENCOUNTER — Other Ambulatory Visit: Payer: Self-pay

## 2021-04-16 VITALS — BP 145/78 | HR 96

## 2021-04-16 DIAGNOSIS — N390 Urinary tract infection, site not specified: Secondary | ICD-10-CM

## 2021-04-16 DIAGNOSIS — N952 Postmenopausal atrophic vaginitis: Secondary | ICD-10-CM

## 2021-04-16 LAB — URINALYSIS, ROUTINE W REFLEX MICROSCOPIC
Bilirubin, UA: NEGATIVE
Glucose, UA: NEGATIVE
Ketones, UA: NEGATIVE
Nitrite, UA: NEGATIVE
Protein,UA: NEGATIVE
RBC, UA: NEGATIVE
Specific Gravity, UA: 1.02 (ref 1.005–1.030)
Urobilinogen, Ur: 0.2 mg/dL (ref 0.2–1.0)
pH, UA: 6.5 (ref 5.0–7.5)

## 2021-04-16 LAB — MICROSCOPIC EXAMINATION
RBC, Urine: NONE SEEN /hpf (ref 0–2)
Renal Epithel, UA: NONE SEEN /hpf

## 2021-04-16 NOTE — Progress Notes (Signed)
Urological Symptom Review  Patient is experiencing the following symptoms: Frequent urination Get up at night to urinate Trouble starting stream Urinary tract infection   Review of Systems  Gastrointestinal (upper)  : Negative for upper GI symptoms  Gastrointestinal (lower) : Diarrhea Constipation  Constitutional : Night Sweats Fatigue  Skin: Itching  Eyes: Negative for eye symptoms  Ear/Nose/Throat : Negative for Ear/Nose/Throat symptoms  Hematologic/Lymphatic: Negative for Hematologic/Lymphatic symptoms  Cardiovascular : Negative for cardiovascular symptoms  Respiratory : Cough Shortness of breath  Endocrine: Negative for endocrine symptoms  Musculoskeletal: Back pain Joint pain  Neurological: Dizziness  Psychologic: Depression Anxiety

## 2021-04-19 ENCOUNTER — Encounter: Payer: Self-pay | Admitting: Urology

## 2021-04-21 ENCOUNTER — Other Ambulatory Visit: Payer: Self-pay | Admitting: Family

## 2021-04-22 ENCOUNTER — Other Ambulatory Visit: Payer: Self-pay | Admitting: Family

## 2021-04-22 DIAGNOSIS — E1159 Type 2 diabetes mellitus with other circulatory complications: Secondary | ICD-10-CM

## 2021-04-27 DIAGNOSIS — J8410 Pulmonary fibrosis, unspecified: Secondary | ICD-10-CM | POA: Diagnosis not present

## 2021-04-27 DIAGNOSIS — I509 Heart failure, unspecified: Secondary | ICD-10-CM | POA: Diagnosis not present

## 2021-04-27 DIAGNOSIS — J449 Chronic obstructive pulmonary disease, unspecified: Secondary | ICD-10-CM | POA: Diagnosis not present

## 2021-04-30 ENCOUNTER — Encounter: Payer: Self-pay | Admitting: Family

## 2021-04-30 ENCOUNTER — Ambulatory Visit (INDEPENDENT_AMBULATORY_CARE_PROVIDER_SITE_OTHER): Payer: Medicare Other | Admitting: Family

## 2021-04-30 ENCOUNTER — Other Ambulatory Visit: Payer: Self-pay

## 2021-04-30 VITALS — BP 137/78 | HR 96 | Temp 97.8°F | Ht 67.0 in | Wt 207.0 lb

## 2021-04-30 DIAGNOSIS — I480 Paroxysmal atrial fibrillation: Secondary | ICD-10-CM | POA: Diagnosis not present

## 2021-04-30 DIAGNOSIS — E1159 Type 2 diabetes mellitus with other circulatory complications: Secondary | ICD-10-CM

## 2021-04-30 DIAGNOSIS — E785 Hyperlipidemia, unspecified: Secondary | ICD-10-CM

## 2021-04-30 DIAGNOSIS — I272 Pulmonary hypertension, unspecified: Secondary | ICD-10-CM

## 2021-04-30 DIAGNOSIS — F331 Major depressive disorder, recurrent, moderate: Secondary | ICD-10-CM

## 2021-04-30 DIAGNOSIS — E1169 Type 2 diabetes mellitus with other specified complication: Secondary | ICD-10-CM

## 2021-04-30 DIAGNOSIS — E039 Hypothyroidism, unspecified: Secondary | ICD-10-CM | POA: Diagnosis not present

## 2021-04-30 DIAGNOSIS — J9611 Chronic respiratory failure with hypoxia: Secondary | ICD-10-CM

## 2021-04-30 DIAGNOSIS — E1142 Type 2 diabetes mellitus with diabetic polyneuropathy: Secondary | ICD-10-CM

## 2021-04-30 DIAGNOSIS — K219 Gastro-esophageal reflux disease without esophagitis: Secondary | ICD-10-CM | POA: Diagnosis not present

## 2021-04-30 DIAGNOSIS — M159 Polyosteoarthritis, unspecified: Secondary | ICD-10-CM

## 2021-04-30 DIAGNOSIS — I5032 Chronic diastolic (congestive) heart failure: Secondary | ICD-10-CM | POA: Diagnosis not present

## 2021-04-30 DIAGNOSIS — F419 Anxiety disorder, unspecified: Secondary | ICD-10-CM

## 2021-04-30 DIAGNOSIS — I4891 Unspecified atrial fibrillation: Secondary | ICD-10-CM | POA: Diagnosis not present

## 2021-04-30 DIAGNOSIS — Z905 Acquired absence of kidney: Secondary | ICD-10-CM

## 2021-04-30 DIAGNOSIS — I152 Hypertension secondary to endocrine disorders: Secondary | ICD-10-CM | POA: Diagnosis not present

## 2021-04-30 DIAGNOSIS — N183 Chronic kidney disease, stage 3 unspecified: Secondary | ICD-10-CM

## 2021-04-30 DIAGNOSIS — N390 Urinary tract infection, site not specified: Secondary | ICD-10-CM

## 2021-04-30 DIAGNOSIS — J449 Chronic obstructive pulmonary disease, unspecified: Secondary | ICD-10-CM

## 2021-04-30 LAB — BAYER DCA HB A1C WAIVED: HB A1C (BAYER DCA - WAIVED): 6.2 % — ABNORMAL HIGH (ref 4.8–5.6)

## 2021-04-30 MED ORDER — METHENAMINE HIPPURATE 1 G PO TABS: 1.0000 g | ORAL_TABLET | Freq: Two times a day (BID) | ORAL | 2 refills | Status: DC

## 2021-04-30 MED ORDER — ESTRADIOL 0.1 MG/GM VA CREA: 1.0000 | TOPICAL_CREAM | Freq: Every day | VAGINAL | 12 refills | Status: DC

## 2021-04-30 NOTE — Progress Notes (Signed)
Opened in error

## 2021-04-30 NOTE — Patient Instructions (Signed)
Health Maintenance After Age 81 After age 81, you are at a higher risk for certain long-term diseases and infections as well as injuries from falls. Falls are a major cause of broken bones and head injuries in people who are older than age 81. Getting regular preventive care can help to keep you healthy and well. Preventive care includes getting regular testing and making lifestyle changes as recommended by your health care provider. Talk with your health care provider about: Which screenings and tests you should have. A screening is a test that checks for a disease when you have no symptoms. A diet and exercise plan that is right for you. What should I know about screenings and tests to prevent falls? Screening and testing are the best ways to find a health problem early. Early diagnosis and treatment give you the best chance of managing medical conditions that are common after age 81. Certain conditions and lifestyle choices may make you more likely to have a fall. Your health care provider may recommend: Regular vision checks. Poor vision and conditions such as cataracts can make you more likely to have a fall. If you wear glasses, make sure to get your prescription updated if your vision changes. Medicine review. Work with your health care provider to regularly review all of the medicines you are taking, including over-the-counter medicines. Ask your health care provider about any side effects that may make you more likely to have a fall. Tell your health care provider if any medicines that you take make you feel dizzy or sleepy. Strength and balance checks. Your health care provider may recommend certain tests to check your strength and balance while standing, walking, or changing positions. Foot health exam. Foot pain and numbness, as well as not wearing proper footwear, can make you more likely to have a fall. Screenings, including: Osteoporosis screening. Osteoporosis is a condition that causes  the bones to get weaker and break more easily. Blood pressure screening. Blood pressure changes and medicines to control blood pressure can make you feel dizzy. Depression screening. You may be more likely to have a fall if you have a fear of falling, feel depressed, or feel unable to do activities that you used to do. Alcohol use screening. Using too much alcohol can affect your balance and may make you more likely to have a fall. Follow these instructions at home: Lifestyle Do not drink alcohol if: Your health care provider tells you not to drink. If you drink alcohol: Limit how much you have to: 0-1 drink a day for women. 0-2 drinks a day for men. Know how much alcohol is in your drink. In the [place] one drink equals one 12 oz bottle of beer (355 mL), one 5 oz glass of wine (148 mL), or one 1 oz glass of hard liquor (44 mL). Do not use any products that contain nicotine or tobacco. These products include cigarettes, chewing tobacco, and vaping devices, such as e-cigarettes. If you need help quitting, ask your health care provider. Activity  Follow a regular exercise program to stay fit. This will help you maintain your balance. Ask your health care provider what types of exercise are appropriate for you. If you need a cane or walker, use it as recommended by your health care provider. Wear supportive shoes that have nonskid soles. Safety  Remove any tripping hazards, such as rugs, cords, and clutter. Install safety equipment such as grab bars in bathrooms and safety rails on stairs. Keep rooms and walkways   well-lit. General instructions Talk with your health care provider about your risks for falling. Tell your health care provider if: You fall. Be sure to tell your health care provider about all falls, even ones that seem minor. You feel dizzy, tiredness (fatigue), or off-balance. Take over-the-counter and prescription medicines only as told by your health care provider. These include  supplements. Eat a healthy diet and maintain a healthy weight. A healthy diet includes low-fat dairy products, low-fat (lean) meats, and fiber from whole grains, beans, and lots of fruits and vegetables. Stay current with your vaccines. Schedule regular health, dental, and eye exams. Summary Having a healthy lifestyle and getting preventive care can help to protect your health and wellness after age 81. Screening and testing are the best way to find a health problem early and help you avoid having a fall. Early diagnosis and treatment give you the best chance for managing medical conditions that are more common for people who are older than age 81. Falls are a major cause of broken bones and head injuries in people who are older than age 81. Take precautions to prevent a fall at home. Work with your health care provider to learn what changes you can make to improve your health and wellness and to prevent falls. This information is not intended to replace advice given to you by your health care provider. Make sure you discuss any questions you have with your health care provider. Document Revised: 08/27/2020 Document Reviewed: 08/27/2020 Elsevier Patient Education  2022 [employer] Inc.  

## 2021-04-30 NOTE — Progress Notes (Signed)
Subjective:    Patient ID: Lorraine Leonard, female    DOB: 07/07/1940, 81 y.o.   MRN: [id number]  Chief Complaint  Patient presents with   Medical Management of Chronic Issues   Pt presents to the office today for chronic follow up. She is followed by Cardiologists every 6 months for CHF & A Fib. She is followed by Pulmonologist annually for pulmonary hypertension and Pulmonary Fibrosis. She was told to decrease her oxygen to as needed. Pt is not on oxygen today.  She reports her breathing is stable, but SOB with exertion. She is followed by Nephrologists every 6 months for CKD.  She is followed by Urologists for recurrent UTI's annually.  Hypertension This is a chronic problem. The current episode started more than 1 year ago. The problem has been resolved since onset. The problem is controlled. Associated symptoms include peripheral edema and shortness of breath. Pertinent negatives include no blurred vision or malaise/fatigue. Risk factors for coronary artery disease include dyslipidemia, obesity and sedentary lifestyle. The current treatment provides moderate improvement. Hypertensive end-organ damage includes heart failure. Identifiable causes of hypertension include a thyroid problem.  Congestive Heart Failure Presents for follow-up visit. Associated symptoms include edema, fatigue and shortness of breath. Pertinent negatives include no chest pressure. The symptoms have been stable.  Gastroesophageal Reflux She complains of belching and heartburn. This is a chronic problem. The current episode started more than 1 year ago. The problem occurs occasionally. Associated symptoms include fatigue. Risk factors include obesity. She has tried a PPI for the symptoms. The treatment provided moderate relief.  Diabetes She presents for her follow-up diabetic visit. She has type 2 diabetes mellitus. Hypoglycemia symptoms include nervousness/anxiousness. Associated symptoms include fatigue and foot  paresthesias. Pertinent negatives for diabetes include no blurred vision. There are no hypoglycemic complications. Symptoms are stable. Diabetic complications include heart disease, nephropathy and peripheral neuropathy. Risk factors for coronary artery disease include dyslipidemia, diabetes mellitus, hypertension, sedentary lifestyle and post-menopausal. She is following a generally healthy diet. (Does not check this at home) An ACE inhibitor/angiotensin II receptor blocker is being taken.  Thyroid Problem Presents for follow-up visit. Symptoms include anxiety, depressed mood and fatigue. Patient reports no dry skin or menstrual problem. The symptoms have been stable. Her past medical history is significant for heart failure and hyperlipidemia.  Hyperlipidemia This is a chronic problem. The current episode started more than 1 year ago. Exacerbating diseases include obesity. Associated symptoms include shortness of breath. Current antihyperlipidemic treatment includes statins. The current treatment provides moderate improvement of lipids. Risk factors for coronary artery disease include dyslipidemia, diabetes mellitus, hypertension, a sedentary lifestyle and post-menopausal.  Arthritis Presents for follow-up visit. She complains of pain and stiffness. The symptoms have been stable. Affected locations include the left knee, right knee, left MCP and right MCP. Her pain is at a severity of 1/10. Associated symptoms include fatigue. Pertinent negatives include no fever.  Anemia Presents for follow-up visit. There has been no bruising/bleeding easily, fever or malaise/fatigue. Past medical history includes heart failure.  Anxiety Presents for follow-up visit. Symptoms include depressed mood, excessive worry, irritability, nervous/anxious behavior, restlessness and shortness of breath. Symptoms occur occasionally. The severity of symptoms is moderate.   Her past medical history is significant for anemia.   Depression        This is a chronic problem.  The current episode started more than 1 year ago.   Associated symptoms include fatigue, helplessness, irritable, restlessness and sad.  Associated  symptoms include no hopelessness.  Past medical history includes thyroid problem.      Review of Systems  Constitutional:  Positive for fatigue and irritability. Negative for fever and malaise/fatigue.  Eyes:  Negative for blurred vision.  Respiratory:  Positive for shortness of breath.   Gastrointestinal:  Positive for heartburn.  Genitourinary:  Negative for menstrual problem.  Musculoskeletal:  Positive for arthritis and stiffness.  Hematological:  Does not bruise/bleed easily.  Psychiatric/Behavioral:  Positive for depression. The patient is nervous/anxious.   All other systems reviewed and are negative.     Objective:   Physical Exam Vitals reviewed.  Constitutional:      General: She is irritable. She is not in acute distress.    Appearance: She is well-developed. She is obese.  HENT:     Head: Normocephalic and atraumatic.     Right Ear: Tympanic membrane normal.     Left Ear: Tympanic membrane normal.  Eyes:     Pupils: Pupils are equal, round, and reactive to light.  Neck:     Thyroid: No thyromegaly.  Cardiovascular:     Rate and Rhythm: Normal rate and regular rhythm.     Heart sounds: Normal heart sounds. No murmur heard. Pulmonary:     Effort: Pulmonary effort is normal. No respiratory distress.     Breath sounds: Normal breath sounds. No wheezing.  Abdominal:     General: Bowel sounds are normal. There is no distension.     Palpations: Abdomen is soft.     Tenderness: There is no abdominal tenderness.  Musculoskeletal:        General: No tenderness. Normal range of motion.     Cervical back: Normal range of motion and neck supple.  Skin:    General: Skin is warm and dry.  Neurological:     Mental Status: She is alert and oriented to person, place, and time.      Cranial Nerves: No cranial nerve deficit.     Motor: Weakness present.     Gait: Gait abnormal.     Deep Tendon Reflexes: Reflexes are normal and symmetric.  Psychiatric:        Behavior: Behavior normal.        Thought Content: Thought content normal.        Judgment: Judgment normal.     BP 137/78    Pulse 96    Temp 97.8 F (36.6 C) (Temporal)    Ht 5' 7" (1.702 m)    Wt 207 lb (93.9 kg)    BMI 32.42 kg/m       Assessment & Plan:  TAKISHA PELLE comes in today with chief complaint of Medical Management of Chronic Issues   Diagnosis and orders addressed:  1. Pulmonary hypertension (HCC) - CMP14+EGFR - CBC with Differential/Platelet  2. Paroxysmal atrial fibrillation (HCC) - CMP14+EGFR - CBC with Differential/Platelet  3. Hypertension associated with diabetes ([place]) - CMP14+EGFR - CBC with Differential/Platelet  4. Chronic diastolic heart failure (HCC) - CMP14+EGFR - CBC with Differential/Platelet  5. Atrial fibrillation, unspecified type (HCC) - CMP14+EGFR - CBC with Differential/Platelet  6. Chronic obstructive pulmonary disease, unspecified COPD type ([place]) - CMP14+EGFR - CBC with Differential/Platelet  7. Gastroesophageal reflux disease, unspecified whether esophagitis present - CMP14+EGFR - CBC with Differential/Platelet  8. Chronic respiratory failure with hypoxia (HCC) - CMP14+EGFR - CBC with Differential/Platelet  9. Type 2 diabetes mellitus with diabetic polyneuropathy, without long-term current use of insulin (HCC) - Bayer DCA Hb A1c Waived -  CMP14+EGFR - CBC with Differential/Platelet - Microalbumin / creatinine urine ratio  10. Hypothyroidism, unspecified type - CMP14+EGFR - CBC with Differential/Platelet - TSH  11. Hyperlipidemia associated with type 2 diabetes mellitus (HCC) - CMP14+EGFR - CBC with Differential/Platelet  12. Osteoarthritis of multiple joints, unspecified osteoarthritis type - CMP14+EGFR - CBC with  Differential/Platelet  13. Stage 3 chronic kidney disease, unspecified whether stage 3a or 3b CKD (HCC) - CMP14+EGFR - CBC with Differential/Platelet  14. Single kidney  - CMP14+EGFR - CBC with Differential/Platelet  15. Moderate episode of recurrent major depressive disorder (HCC) - CMP14+EGFR - CBC with Differential/Platelet  16. Anxiety - CMP14+EGFR - CBC with Differential/Platelet  17. Recurrent UTI - CMP14+EGFR - CBC with Differential/Platelet - methenamine (HIPREX) 1 g tablet; Take 1 tablet (1 g total) by mouth 2 (two) times daily with a meal.  Dispense: 180 tablet; Refill: 2 - estradiol (ESTRACE VAGINAL) 0.1 MG/GM vaginal cream; Place 1 Applicatorful vaginally at bedtime.  Dispense: 42.5 g; Refill: 12   Labs pending Handicap form completed Health Maintenance reviewed Diet and exercise encouraged  Follow up plan: 3 months    Evelina Dun, FNP

## 2021-04-30 NOTE — Telephone Encounter (Signed)
Please advise direction for use.

## 2021-05-01 ENCOUNTER — Other Ambulatory Visit: Payer: Self-pay | Admitting: Family

## 2021-05-01 LAB — CMP14+EGFR
ALT: 18 IU/L (ref 0–32)
AST: 25 IU/L (ref 0–40)
Albumin/Globulin Ratio: 1.4 (ref 1.2–2.2)
Albumin: 4.4 g/dL (ref 3.7–4.7)
Alkaline Phosphatase: 79 IU/L (ref 44–121)
BUN/Creatinine Ratio: 17 (ref 12–28)
BUN: 20 mg/dL (ref 8–27)
Bilirubin Total: 0.4 mg/dL (ref 0.0–1.2)
CO2: 24 mmol/L (ref 20–29)
Calcium: 10 mg/dL (ref 8.7–10.3)
Chloride: 98 mmol/L (ref 96–106)
Creatinine, Ser: 1.15 mg/dL — ABNORMAL HIGH (ref 0.57–1.00)
Globulin, Total: 3.1 g/dL (ref 1.5–4.5)
Glucose: 154 mg/dL — ABNORMAL HIGH (ref 70–99)
Potassium: 4.8 mmol/L (ref 3.5–5.2)
Sodium: 139 mmol/L (ref 134–144)
Total Protein: 7.5 g/dL (ref 6.0–8.5)
eGFR: 48 mL/min/{1.73_m2} — ABNORMAL LOW (ref >59)

## 2021-05-01 LAB — CBC WITH DIFFERENTIAL/PLATELET
Basophils Absolute: 0.1 10*3/uL (ref 0.0–0.2)
Basos: 1 %
EOS (ABSOLUTE): 0.1 10*3/uL (ref 0.0–0.4)
Eos: 0 %
Hematocrit: 40.6 % (ref 34.0–46.6)
Hemoglobin: 13.1 g/dL (ref 11.1–15.9)
Immature Grans (Abs): 0 10*3/uL (ref 0.0–0.1)
Immature Granulocytes: 0 %
Lymphocytes Absolute: 2.3 10*3/uL (ref 0.7–3.1)
Lymphs: 19 %
MCH: 27.6 pg (ref 26.6–33.0)
MCHC: 32.3 g/dL (ref 31.5–35.7)
MCV: 86 fL (ref 79–97)
Monocytes Absolute: 0.8 10*3/uL (ref 0.1–0.9)
Monocytes: 6 %
Neutrophils Absolute: 8.9 10*3/uL — ABNORMAL HIGH (ref 1.4–7.0)
Neutrophils: 74 %
Platelets: 308 10*3/uL (ref 150–450)
RBC: 4.75 x10E6/uL (ref 3.77–5.28)
RDW: 13.6 % (ref 11.7–15.4)
WBC: 12.1 10*3/uL — ABNORMAL HIGH (ref 3.4–10.8)

## 2021-05-01 LAB — MICROALBUMIN / CREATININE URINE RATIO
Creatinine, Urine: 28.5 mg/dL
Microalb/Creat Ratio: 45 mg/g creat — ABNORMAL HIGH (ref 0–29)
Microalbumin, Urine: 12.9 ug/mL

## 2021-05-01 LAB — TSH: TSH: 0.599 u[IU]/mL (ref 0.450–4.500)

## 2021-05-01 MED ORDER — CEPHALEXIN 500 MG PO CAPS: 500.0000 mg | ORAL_CAPSULE | Freq: Two times a day (BID) | ORAL | 0 refills | Status: DC

## 2021-05-06 ENCOUNTER — Other Ambulatory Visit: Payer: Self-pay | Admitting: Urology

## 2021-05-07 ENCOUNTER — Other Ambulatory Visit: Payer: Self-pay

## 2021-05-07 DIAGNOSIS — N390 Urinary tract infection, site not specified: Secondary | ICD-10-CM

## 2021-05-07 NOTE — Telephone Encounter (Signed)
Filled by PCP on 04/30/21

## 2021-05-27 ENCOUNTER — Encounter: Payer: Self-pay | Admitting: Family Medicine

## 2021-05-27 ENCOUNTER — Ambulatory Visit (INDEPENDENT_AMBULATORY_CARE_PROVIDER_SITE_OTHER): Payer: Medicare Other | Admitting: Family Medicine

## 2021-05-27 DIAGNOSIS — N390 Urinary tract infection, site not specified: Secondary | ICD-10-CM | POA: Diagnosis not present

## 2021-05-27 LAB — MICROSCOPIC EXAMINATION
RBC, Urine: NONE SEEN /hpf (ref 0–2)
Renal Epithel, UA: NONE SEEN /hpf

## 2021-05-27 LAB — URINALYSIS, ROUTINE W REFLEX MICROSCOPIC
Bilirubin, UA: NEGATIVE
Glucose, UA: NEGATIVE
Ketones, UA: NEGATIVE
Nitrite, UA: POSITIVE — AB
Protein,UA: NEGATIVE
RBC, UA: NEGATIVE
Specific Gravity, UA: 1.01 (ref 1.005–1.030)
Urobilinogen, Ur: 0.2 mg/dL (ref 0.2–1.0)
pH, UA: 5.5 (ref 5.0–7.5)

## 2021-05-27 MED ORDER — SULFAMETHOXAZOLE-TRIMETHOPRIM 800-160 MG PO TABS: 1.0000 | ORAL_TABLET | Freq: Two times a day (BID) | ORAL | 0 refills | Status: AC

## 2021-05-27 NOTE — Progress Notes (Signed)
Telephone visit  Subjective: CC: UTI PCP: Sharion Balloon, FNP [id number] E Lorraine Leonard is a 81 y.o. female calls for telephone consult today. Patient provides verbal consent for consult held via phone.  Due to COVID-19 pandemic this visit was conducted virtually. This visit type was conducted due to national recommendations for restrictions regarding the COVID-19 Pandemic (e.g. social distancing, sheltering in place) in an effort to limit this patient's exposure and mitigate transmission in our community. All issues noted in this document were discussed and addressed.  A physical exam was not performed with this format.   Location of patient: home Location of provider: [facility] Others present for call: none  1. Urinary symptoms Patient reports several week h/o UTI symptoms that are waxing and waning since she was taken off antibiotic in December.  She reports dysuria, urinary frequency, urgency.  She denies hematuria, fevers, chills, abdominal pain, nausea, vomiting, back pain, vaginal discharge.  Patient has used AZO for symptoms.  Patient reports a h/o frequent or recurrent UTIs.  She sees a urologist.  Status post treatment with Keflex in January    ROS: Per HPI  Allergies  Allergen Reactions   Allopurinol Swelling    Feet and legs swell   Mobic [Meloxicam] Swelling and Other (See Comments)    Feet and Leg swelling   Past Medical History:  Diagnosis Date   Acute diastolic CHF (congestive heart failure) (HCC) 11/11/2016   Allergic rhinitis    Anxiety    Anxiety    Arthritis    "~ all my joints" (05/18/2017)   Atrial fibrillation ([place]) 09/2016   Atrial fibrillation with RVR ([place]) 05/18/2017   Chronic lower back pain    COPD (chronic obstructive pulmonary disease) (HCC)    Depression    Diverticulosis    Dyspnea    Dysrhythmia    GERD (gastroesophageal reflux disease)    Glaucoma    Gout    Heart disease    History of blood transfusion 1983   "when I gave a 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"    Hyperlipidemia    Hypertension    Hypothyroidism    Macular degeneration of both eyes    Neuropathy    On home oxygen therapy    "3L; 24/7" (05/18/2017)   Pre-diabetes    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 , Disp: , Rfl:    cephALEXin (KEFLEX) 500 MG capsule, Take 1 capsule (500 mg total) by mouth 2 (two) times daily., Disp: 14 capsule, Rfl: 0   cyanocobalamin 1000 MCG tablet, Take 1,000 mcg by mouth daily. Vitamin b12 every AM, Disp: , Rfl:    diclofenac Sodium (VOLTAREN) 1 % GEL, APPLY 4 GRAMS TOPICALLY 4 TIMES A DAY, Disp: 400 g, Rfl: 1   ELIQUIS 5 MG TABS tablet, TAKE 1 TABLET BY MOUTH TWICE A  DAY, Disp: 180 tablet, Rfl: 1   estradiol (ESTRACE VAGINAL) 0.1 MG/GM vaginal cream, Place 1 Applicatorful vaginally at bedtime., Disp: 42.5 g, Rfl: 12   fluticasone (FLONASE) 50 MCG/ACT nasal spray, PLACE 1 SPRAY INTO BOTH NOSTRILS 2 (TWO) TIMES DAILY AS NEEDED FOR ALLERGIES OR RHINITIS., Disp: 48 mL, Rfl: 1   furosemide (LASIX) 40 MG tablet, TAKE 1 TABLET BY MOUTH TWICE A DAY, Disp: 180 tablet, Rfl: 3   levocetirizine (XYZAL) 5 MG tablet, TAKE 1 TABLET BY MOUTH EVERY DAY IN THE EVENING, Disp: 90 tablet, Rfl: 1   levothyroxine (SYNTHROID) 112 MCG tablet, TAKE 1 TABLET BY MOUTH EVERY DAY, Disp: 90 tablet, Rfl: 1   metFORMIN (GLUCOPHAGE-XR) 500 MG 24 hr tablet, TAKE 1 TABLET BY MOUTH EVERY DAY WITH BREAKFAST, Disp: 90  tablet, Rfl: 1   methenamine (HIPREX) 1 g tablet, Take 1 tablet (1 g total) by mouth 2 (two) times daily with a meal., Disp: 180 tablet, Rfl: 2   Multiple Vitamins-Minerals (PRESERVISION AREDS 2) CAPS, Take 1 capsule by mouth 2 (two) times daily., Disp: , Rfl:    Olopatadine HCl 0.2 % SOLN, INSTILL 1 DROP INTO BOTH EYES EVERY DAY, Disp: 10 mL, Rfl: 5   omeprazole (PRILOSEC) 20 MG capsule, Take 1 capsule (20 mg total) by mouth daily., Disp: 90 capsule, Rfl: 1   ondansetron (ZOFRAN ODT) 4 MG disintegrating tablet, Take 1 tablet (4 mg total) by mouth every 8 (eight) hours as needed for nausea or vomiting., Disp: 20 tablet, Rfl: 0   OXYGEN, Inhale 3 L into the lungs every evening., Disp: , Rfl:    Polyethyl Glycol-Propyl Glycol (SYSTANE) 0.4-0.3 % GEL ophthalmic gel, Place 1 application into both eyes at bedtime as needed (dry eyes)., Disp: , Rfl:    predniSONE (DELTASONE) 5 MG tablet, Take 1 tablet (5 mg total) by mouth daily with breakfast., Disp: 90 tablet, Rfl: 2   rosuvastatin (CRESTOR) 10 MG tablet, TAKE 1 TABLET BY MOUTH EVERYDAY AT BEDTIME, Disp: 90 tablet, Rfl: 1   spironolactone (ALDACTONE) 25 MG tablet, TAKE 1 TABLET (25 MG TOTAL) BY MOUTH DAILY., Disp: 90 tablet, Rfl: 0   vitamin C (ASCORBIC ACID) 500 MG tablet, Take 1,000 mg by mouth daily. AM, Disp: , Rfl:    Vitamin D, Ergocalciferol, (DRISDOL) 1.25 MG (50000 UNIT) CAPS capsule, Take 1 capsule (50,000 Units total) by mouth every 7 (seven) days., Disp: 12 capsule, Rfl: 1  Assessment/ Plan: 81 y.o. female   Recurrent UTI - Plan: Urinalysis, Routine w reflex microscopic, Urine Culture, sulfamethoxazole-trimethoprim (BACTRIM DS) 800-160 MG tablet  Urinalysis positive for nitrites, white blood cell.  Negative for blood.  Few bacteria seen on microscopy.  This is been sent for culture.  I reviewed her July 2022 urine culture which grew Klebsiella that was multidrug resistant.  Septra sent.  Home care instructions reviewed with the patient  including increase hydration.  She will follow-up as needed   Start time: 1:13pm; called back with results/ plan 1:38pm End time: 1:17pm; 1:40p  Total time spent on patient care (including telephone call/ virtual visit): 6 minutes  [place], [place] [phone]

## 2021-05-28 DIAGNOSIS — J449 Chronic obstructive pulmonary disease, unspecified: Secondary | ICD-10-CM | POA: Diagnosis not present

## 2021-05-28 DIAGNOSIS — J8410 Pulmonary fibrosis, unspecified: Secondary | ICD-10-CM | POA: Diagnosis not present

## 2021-05-28 DIAGNOSIS — I509 Heart failure, unspecified: Secondary | ICD-10-CM | POA: Diagnosis not present

## 2021-05-29 LAB — URINE CULTURE

## 2021-06-09 ENCOUNTER — Other Ambulatory Visit: Payer: Self-pay | Admitting: Family

## 2021-06-09 DIAGNOSIS — J449 Chronic obstructive pulmonary disease, unspecified: Secondary | ICD-10-CM

## 2021-06-21 ENCOUNTER — Telehealth: Payer: Self-pay | Admitting: Internal Medicine

## 2021-06-21 NOTE — Telephone Encounter (Signed)
Patient got a letter in regards to her "no show" from her 06/04/21 home remote device check. She stated that it did look like her machine was off so her son unplugged it then plugged it back in and light came back on. Please let her know if her home device is working now.  ?

## 2021-06-22 ENCOUNTER — Other Ambulatory Visit: Payer: Self-pay | Admitting: Family

## 2021-06-22 DIAGNOSIS — F419 Anxiety disorder, unspecified: Secondary | ICD-10-CM

## 2021-06-23 NOTE — Telephone Encounter (Signed)
Last office visit 04/30/21 ?On current med list 10/30/20   ?Not on current med list now ? ?

## 2021-06-24 NOTE — Telephone Encounter (Signed)
Called patient to help her send a manual transmission. No answer/ no voicemail. ?

## 2021-06-25 ENCOUNTER — Ambulatory Visit (INDEPENDENT_AMBULATORY_CARE_PROVIDER_SITE_OTHER): Payer: Medicare Other

## 2021-06-25 DIAGNOSIS — J8410 Pulmonary fibrosis, unspecified: Secondary | ICD-10-CM | POA: Diagnosis not present

## 2021-06-25 DIAGNOSIS — I442 Atrioventricular block, complete: Secondary | ICD-10-CM

## 2021-06-25 DIAGNOSIS — I509 Heart failure, unspecified: Secondary | ICD-10-CM | POA: Diagnosis not present

## 2021-06-25 DIAGNOSIS — J449 Chronic obstructive pulmonary disease, unspecified: Secondary | ICD-10-CM | POA: Diagnosis not present

## 2021-06-25 LAB — CUP PACEART REMOTE DEVICE CHECK
Battery Remaining Longevity: 35 mo
Battery Voltage: 2.87 V
Brady Statistic AP VP Percent: 1.78 %
Brady Statistic AP VS Percent: 70.36 %
Brady Statistic AS VP Percent: 0 %
Brady Statistic AS VS Percent: 27.86 %
Brady Statistic RA Percent Paced: 98.03 %
Brady Statistic RV Percent Paced: 1.78 %
Date Time Interrogation Session: 20230307153325
Implantable Lead Implant Date: 20190128
Implantable Lead Implant Date: 20190128
Implantable Lead Location: 753860
Implantable Lead Location: 753860
Implantable Lead Model: 3830
Implantable Lead Model: 5076
Implantable Pulse Generator Implant Date: 20190128
Lead Channel Impedance Value: 304 Ohm
Lead Channel Impedance Value: 342 Ohm
Lead Channel Impedance Value: 399 Ohm
Lead Channel Impedance Value: 418 Ohm
Lead Channel Sensing Intrinsic Amplitude: 10.5 mV
Lead Channel Sensing Intrinsic Amplitude: 10.5 mV
Lead Channel Sensing Intrinsic Amplitude: 2.75 mV
Lead Channel Sensing Intrinsic Amplitude: 2.75 mV
Lead Channel Setting Pacing Amplitude: 2.5 V
Lead Channel Setting Pacing Amplitude: 3.5 V
Lead Channel Setting Pacing Pulse Width: 0.4 ms
Lead Channel Setting Sensing Sensitivity: 0.9 mV

## 2021-06-25 NOTE — Telephone Encounter (Signed)
Transmission received 06-25-2021. ?

## 2021-06-28 ENCOUNTER — Other Ambulatory Visit: Payer: Self-pay | Admitting: Family

## 2021-06-28 DIAGNOSIS — E1159 Type 2 diabetes mellitus with other circulatory complications: Secondary | ICD-10-CM

## 2021-06-29 ENCOUNTER — Other Ambulatory Visit: Payer: Self-pay | Admitting: Family

## 2021-07-08 ENCOUNTER — Ambulatory Visit: Payer: Medicare Other | Admitting: Cardiology

## 2021-07-08 NOTE — Progress Notes (Signed)
Remote pacemaker transmission.   

## 2021-07-09 ENCOUNTER — Other Ambulatory Visit: Payer: Self-pay | Admitting: Family

## 2021-07-18 ENCOUNTER — Other Ambulatory Visit: Payer: Self-pay | Admitting: Family

## 2021-07-18 DIAGNOSIS — J019 Acute sinusitis, unspecified: Secondary | ICD-10-CM

## 2021-07-22 ENCOUNTER — Encounter: Payer: Self-pay | Admitting: Family

## 2021-07-22 ENCOUNTER — Ambulatory Visit (INDEPENDENT_AMBULATORY_CARE_PROVIDER_SITE_OTHER): Payer: Medicare Other | Admitting: Family

## 2021-07-22 DIAGNOSIS — R399 Unspecified symptoms and signs involving the genitourinary system: Secondary | ICD-10-CM

## 2021-07-22 LAB — URINALYSIS, COMPLETE
Bilirubin, UA: NEGATIVE
Glucose, UA: NEGATIVE
Ketones, UA: NEGATIVE
Leukocytes,UA: NEGATIVE
Nitrite, UA: NEGATIVE
Protein,UA: NEGATIVE
RBC, UA: NEGATIVE
Specific Gravity, UA: 1.01 (ref 1.005–1.030)
Urobilinogen, Ur: 0.2 mg/dL (ref 0.2–1.0)
pH, UA: 7 (ref 5.0–7.5)

## 2021-07-22 LAB — MICROSCOPIC EXAMINATION
RBC, Urine: NONE SEEN /hpf (ref 0–2)
Renal Epithel, UA: NONE SEEN /hpf

## 2021-07-22 NOTE — Progress Notes (Signed)
? ?  Virtual Visit  Note ?Due to COVID-19 pandemic this visit was conducted virtually. This visit type was conducted due to national recommendations for restrictions regarding the COVID-19 Pandemic (e.g. social distancing, sheltering in place) in an effort to limit this patient's exposure and mitigate transmission in our community. All issues noted in this document were discussed and addressed.  A physical exam was not performed with this format. ? ?I connected with Lorraine Leonard on 07/22/21 at 2:35 pm  by telephone and verified that I am speaking with the correct person using two identifiers. Lorraine Leonard is currently located at home and no one is currently with her during visit. The provider, Evelina Dun, FNP is located in their office at time of visit. ? ?I discussed the limitations, risks, security and privacy concerns of performing an evaluation and management service by telephone and the availability of in person appointments. I also discussed with the patient that there may be a patient responsible charge related to this service. The patient expressed understanding and agreed to proceed. ? ? ?History and Present Illness: ? ?Pt calls the office today with UTI symptoms that started a few days ago. States she took AZO that helped with her symptoms.  ?Dysuria  ?This is a new problem. The current episode started in the past 7 days. The problem occurs intermittently. The problem has been gradually worsening. The quality of the pain is described as burning. The pain is mild. Associated symptoms include frequency and urgency. Pertinent negatives include no hematuria, hesitancy, nausea or vomiting. She has tried increased fluids (AZO) for the symptoms. The treatment provided mild relief.  ? ? ? ?Review of Systems  ?Gastrointestinal:  Negative for nausea and vomiting.  ?Genitourinary:  Positive for dysuria, frequency and urgency. Negative for hematuria and hesitancy.  ?All other systems reviewed and are  negative. ? ? ?Observations/Objective: ?No SOB or distress noted  ? ?Assessment and Plan: ?1. UTI symptoms ?Pt reports her son will bring a urine ?Force fluids ?AZO as needed  ?- Urinalysis, Complete ?- Urine Culture ? ?  ?I discussed the assessment and treatment plan with the patient. The patient was provided an opportunity to ask questions and all were answered. The patient agreed with the plan and demonstrated an understanding of the instructions. ?  ?The patient was advised to call back or seek an in-person evaluation if the symptoms worsen or if the condition fails to improve as anticipated. ? ?The above assessment and management plan was discussed with the patient. The patient verbalized understanding of and has agreed to the management plan. Patient is aware to call the clinic if symptoms persist or worsen. Patient is aware when to return to the clinic for a follow-up visit. Patient educated on when it is appropriate to go to the emergency department.  ? ?Time call ended:  12:47 pm  ? ?I provided 12 minutes of  non face-to-face time during this encounter. ? ? ? ?Evelina Dun, FNP ? ? ?

## 2021-07-22 NOTE — Patient Instructions (Signed)
Urinary Tract Infection, Adult A urinary tract infection (UTI) is an infection of any part of the urinary tract. The urinary tract includes the kidneys, ureters, bladder, and urethra. These organs make, store, and get rid of urine in the body. An upper UTI affects the ureters and kidneys. A lower UTI affects the bladder and urethra. What are the causes? Most urinary tract infections are caused by bacteria in your genital area around your urethra, where urine leaves your body. These bacteria grow and cause inflammation of your urinary tract. What increases the risk? You are more likely to develop this condition if: You have a urinary catheter that stays in place. You are not able to control when you urinate or have a bowel movement (incontinence). You are female and you: Use a spermicide or diaphragm for birth control. Have low estrogen levels. Are pregnant. You have certain genes that increase your risk. You are sexually active. You take antibiotic medicines. You have a condition that causes your flow of urine to slow down, such as: An enlarged prostate, if you are female. Blockage in your urethra. A kidney stone. A nerve condition that affects your bladder control (neurogenic bladder). Not getting enough to drink, or not urinating often. You have certain medical conditions, such as: Diabetes. A weak disease-fighting system (immunesystem). Sickle cell disease. Gout. Spinal cord injury. What are the signs or symptoms? Symptoms of this condition include: Needing to urinate right away (urgency). Frequent urination. This may include small amounts of urine each time you urinate. Pain or burning with urination. Blood in the urine. Urine that smells bad or unusual. Trouble urinating. Cloudy urine. Vaginal discharge, if you are female. Pain in the abdomen or the lower back. You may also have: Vomiting or a decreased appetite. Confusion. Irritability or tiredness. A fever or  chills. Diarrhea. The first symptom in older adults may be confusion. In some cases, they may not have any symptoms until the infection has worsened. How is this diagnosed? This condition is diagnosed based on your medical history and a physical exam. You may also have other tests, including: Urine tests. Blood tests. Tests for STIs (sexually transmitted infections). If you have had more than one UTI, a cystoscopy or imaging studies may be done to determine the cause of the infections. How is this treated? Treatment for this condition includes: Antibiotic medicine. Over-the-counter medicines to treat discomfort. Drinking enough water to stay hydrated. If you have frequent infections or have other conditions such as a kidney stone, you may need to see a health care provider who specializes in the urinary tract (urologist). In rare cases, urinary tract infections can cause sepsis. Sepsis is a life-threatening condition that occurs when the body responds to an infection. Sepsis is treated in the hospital with IV antibiotics, fluids, and other medicines. Follow these instructions at home: Medicines Take over-the-counter and prescription medicines only as told by your health care provider. If you were prescribed an antibiotic medicine, take it as told by your health care provider. Do not stop using the antibiotic even if you start to feel better. General instructions Make sure you: Empty your bladder often and completely. Do not hold urine for long periods of time. Empty your bladder after sex. Wipe from front to back after urinating or having a bowel movement if you are female. Use each tissue only one time when you wipe. Drink enough fluid to keep your urine pale yellow. Keep all follow-up visits. This is important. Contact a health care provider   if: Your symptoms do not get better after 1-2 days. Your symptoms go away and then return. Get help right away if: You have severe pain in your  back or your lower abdomen. You have a fever or chills. You have nausea or vomiting. Summary A urinary tract infection (UTI) is an infection of any part of the urinary tract, which includes the kidneys, ureters, bladder, and urethra. Most urinary tract infections are caused by bacteria in your genital area. Treatment for this condition often includes antibiotic medicines. If you were prescribed an antibiotic medicine, take it as told by your health care provider. Do not stop using the antibiotic even if you start to feel better. Keep all follow-up visits. This is important. This information is not intended to replace advice given to you by your health care provider. Make sure you discuss any questions you have with your health care provider. Document Revised: 11/18/2019 Document Reviewed: 11/18/2019 Elsevier Patient Education  2022 [employer]  

## 2021-07-24 LAB — URINE CULTURE

## 2021-07-26 ENCOUNTER — Other Ambulatory Visit: Payer: Self-pay | Admitting: Family

## 2021-07-26 DIAGNOSIS — I509 Heart failure, unspecified: Secondary | ICD-10-CM | POA: Diagnosis not present

## 2021-07-26 DIAGNOSIS — J449 Chronic obstructive pulmonary disease, unspecified: Secondary | ICD-10-CM | POA: Diagnosis not present

## 2021-07-26 DIAGNOSIS — J8410 Pulmonary fibrosis, unspecified: Secondary | ICD-10-CM | POA: Diagnosis not present

## 2021-08-06 ENCOUNTER — Encounter: Payer: Self-pay | Admitting: Family

## 2021-08-06 ENCOUNTER — Ambulatory Visit (INDEPENDENT_AMBULATORY_CARE_PROVIDER_SITE_OTHER): Payer: Medicare Other | Admitting: Family

## 2021-08-06 VITALS — BP 145/71 | HR 82 | Temp 97.2°F | Ht 67.0 in | Wt 209.8 lb

## 2021-08-06 DIAGNOSIS — I5032 Chronic diastolic (congestive) heart failure: Secondary | ICD-10-CM

## 2021-08-06 DIAGNOSIS — F419 Anxiety disorder, unspecified: Secondary | ICD-10-CM

## 2021-08-06 DIAGNOSIS — M7032 Other bursitis of elbow, left elbow: Secondary | ICD-10-CM

## 2021-08-06 DIAGNOSIS — I152 Hypertension secondary to endocrine disorders: Secondary | ICD-10-CM | POA: Diagnosis not present

## 2021-08-06 DIAGNOSIS — E1142 Type 2 diabetes mellitus with diabetic polyneuropathy: Secondary | ICD-10-CM

## 2021-08-06 DIAGNOSIS — M159 Polyosteoarthritis, unspecified: Secondary | ICD-10-CM

## 2021-08-06 DIAGNOSIS — R300 Dysuria: Secondary | ICD-10-CM | POA: Diagnosis not present

## 2021-08-06 DIAGNOSIS — E039 Hypothyroidism, unspecified: Secondary | ICD-10-CM | POA: Diagnosis not present

## 2021-08-06 DIAGNOSIS — K219 Gastro-esophageal reflux disease without esophagitis: Secondary | ICD-10-CM

## 2021-08-06 DIAGNOSIS — E1159 Type 2 diabetes mellitus with other circulatory complications: Secondary | ICD-10-CM | POA: Diagnosis not present

## 2021-08-06 DIAGNOSIS — F331 Major depressive disorder, recurrent, moderate: Secondary | ICD-10-CM

## 2021-08-06 DIAGNOSIS — N183 Chronic kidney disease, stage 3 unspecified: Secondary | ICD-10-CM

## 2021-08-06 DIAGNOSIS — E785 Hyperlipidemia, unspecified: Secondary | ICD-10-CM

## 2021-08-06 DIAGNOSIS — E1169 Type 2 diabetes mellitus with other specified complication: Secondary | ICD-10-CM

## 2021-08-06 DIAGNOSIS — J449 Chronic obstructive pulmonary disease, unspecified: Secondary | ICD-10-CM | POA: Diagnosis not present

## 2021-08-06 DIAGNOSIS — I4891 Unspecified atrial fibrillation: Secondary | ICD-10-CM

## 2021-08-06 LAB — MICROSCOPIC EXAMINATION
Renal Epithel, UA: NONE SEEN /hpf
WBC, UA: >30 /hpf — AB (ref 0–5)

## 2021-08-06 LAB — URINALYSIS, COMPLETE
Bilirubin, UA: NEGATIVE
Glucose, UA: NEGATIVE
Ketones, UA: NEGATIVE
Nitrite, UA: NEGATIVE
Protein,UA: NEGATIVE
Specific Gravity, UA: 1.015 (ref 1.005–1.030)
Urobilinogen, Ur: 0.2 mg/dL (ref 0.2–1.0)
pH, UA: 6 (ref 5.0–7.5)

## 2021-08-06 LAB — BAYER DCA HB A1C WAIVED: HB A1C (BAYER DCA - WAIVED): 6.3 % — ABNORMAL HIGH (ref 4.8–5.6)

## 2021-08-06 NOTE — Progress Notes (Signed)
? ?Subjective:  ? ? Patient ID: Lorraine Leonard, female    DOB: [birth year], 81 y.o.   MRN: [id number] ? ?Chief Complaint  ?Patient presents with  ? Medical Management of Chronic Issues  ? Dysuria  ? Cyst  ? ?Pt presents to the office today for chronic follow up. She is followed by Cardiologists every 6 months for CHF & A Fib. She is followed by Pulmonologist annually for pulmonary hypertension and Pulmonary Fibrosis. She was told to decrease her oxygen to as needed. Pt is not on oxygen today.  She reports her breathing is stable, but SOB with exertion. She is followed by Nephrologists every 6 months for CKD. She is followed by Urologists for recurrent UTI's annually.  ? ?She is complaining of left elbow swelling.  ?Dysuria  ?This is a recurrent problem. The current episode started in the past 7 days. The problem occurs intermittently. The problem has been waxing and waning. The pain is at a severity of 0/10. The patient is experiencing no pain. Pertinent negatives include no frequency or urgency. She has tried increased fluids for the symptoms. The treatment provided moderate relief.  ?Hypertension ?This is a chronic problem. The current episode started more than 1 year ago. The problem has been resolved since onset. The problem is controlled. Associated symptoms include malaise/fatigue, peripheral edema and shortness of breath. Pertinent negatives include no blurred vision. Risk factors for coronary artery disease include dyslipidemia, diabetes mellitus and sedentary lifestyle. The current treatment provides moderate improvement. Identifiable causes of hypertension include a thyroid problem.  ?Congestive Heart Failure ?Presents for follow-up visit. Associated symptoms include edema, fatigue and shortness of breath. The symptoms have been stable.  ?Asthma ?She complains of frequent throat clearing, hoarse voice, shortness of breath and wheezing. There is no cough. This is a chronic problem. The problem occurs  intermittently. The problem has been waxing and waning. Associated symptoms include heartburn and malaise/fatigue. She reports moderate improvement on treatment. Her past medical history is significant for asthma.  ?Gastroesophageal Reflux ?She complains of belching, heartburn, a hoarse voice and wheezing. She reports no coughing. This is a chronic problem. The current episode started more than 1 year ago. The problem occurs occasionally. Associated symptoms include fatigue. Risk factors include obesity. The treatment provided moderate relief.  ?Diabetes ?She presents for her follow-up diabetic visit. She has type 2 diabetes mellitus. Associated symptoms include fatigue and foot paresthesias. Pertinent negatives for diabetes include no blurred vision. Symptoms are worsening. 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 Pertinent negatives for diabetic complications include no heart disease. Risk factors for coronary artery disease include dyslipidemia, diabetes mellitus, hypertension, sedentary lifestyle and post-menopausal. She is following a generally unhealthy diet. (Does not check BS at home) Eye exam is not current.  ?Thyroid Problem ?Presents for follow-up visit. Symptoms include constipation, diarrhea, fatigue and hoarse voice. Patient reports no depressed mood. The symptoms have been stable. Her past medical history is significant for hyperlipidemia.  ?Hyperlipidemia ?This is a chronic problem. The current episode started more than 1 year ago. The problem is controlled. Exacerbating diseases include obesity. Associated symptoms include shortness of breath. Current antihyperlipidemic treatment includes statins. The current treatment provides moderate improvement of lipids. Risk factors for coronary artery disease include dyslipidemia, diabetes mellitus, hypertension, a sedentary lifestyle and post-menopausal.  ?Arthritis ?Presents for follow-up visit. She complains of pain and  stiffness. The symptoms have been stable. Affected locations include the right knee, left knee, left MCP and right MCP. Her pain is  at a severity of 2/10. Associated symptoms include diarrhea, dysuria and fatigue.  ? ? ? ?Review of Systems  ?Constitutional:  Positive for fatigue and malaise/fatigue.  ?HENT:  Positive for hoarse voice.   ?Eyes:  Negative for blurred vision.  ?Respiratory:  Positive for shortness of breath and wheezing. Negative for cough.   ?Gastrointestinal:  Positive for constipation, diarrhea and heartburn.  ?Genitourinary:  Positive for dysuria. Negative for frequency and urgency.  ?Musculoskeletal:  Positive for arthritis and stiffness.  ?All other systems reviewed and are negative. ? ?   ?Objective:  ? Physical Exam ?Vitals reviewed.  ?Constitutional:   ?   General: She is not in acute distress. ?   Appearance: She is well-developed.  ?HENT:  ?   Head: Normocephalic and atraumatic.  ?   Right Ear: There is impacted cerumen.  ?   Left Ear: There is impacted cerumen.  ?Eyes:  ?   Pupils: Pupils are equal, round, and reactive to light.  ?Neck:  ?   Thyroid: No thyromegaly.  ?Cardiovascular:  ?   Rate and Rhythm: Normal rate and regular rhythm.  ?   Heart sounds: Normal heart sounds. No murmur heard. ?Pulmonary:  ?   Effort: Pulmonary effort is normal. No respiratory distress.  ?   Breath sounds: Normal breath sounds. No wheezing.  ?Abdominal:  ?   General: Bowel sounds are normal. There is no distension.  ?   Palpations: Abdomen is soft.  ?   Tenderness: There is no abdominal tenderness.  ?Genitourinary: ?   Labia:     ?   Right: No rash, tenderness, lesion or injury.     ?   Left: No rash, tenderness, lesion or injury.   ?Musculoskeletal:     ?   General: No tenderness. Normal range of motion.  ?   Cervical back: Normal range of motion and neck supple.  ?   Right lower leg: Edema (2+) present.  ?   Left lower leg: Edema (2+) present.  ?Skin: ?   General: Skin is warm and dry.  ?Neurological:  ?    Mental Status: She is alert and oriented to person, place, and time.  ?   Cranial Nerves: No cranial nerve deficit.  ?   Deep Tendon Reflexes: Reflexes are normal and symmetric.  ?Psychiatric:     ?   Behavior: Behavior normal.     ?   Thought Content: Thought content normal.     ?   Judgment: Judgment normal.  ? ? ? ?  BP (!) 145/71   Pulse 82   Temp (!) 97.2 ?F (36.2 ?C) (Temporal)   Ht _0  (1.702 m)   Wt 209 lb 12.8 oz (95.2 kg)   SpO2 95%   BMI 32.86 kg/m?  ? ?Assessment & Plan:  ?Lorraine Leonard comes in today with chief complaint of Medical Management of Chronic Issues, Dysuria, and Cyst ? ? ?Diagnosis and orders addressed: ? ?1. Atrial fibrillation, unspecified type ([place]) ?- CMP14+EGFR ?- CBC with Differential/Platelet ? ?2. Hypertension associated with diabetes ([place]) ?- CMP14+EGFR ?- CBC with Differential/Platelet ? ?3. Chronic diastolic heart failure ([place]) ?- CMP14+EGFR ?- CBC with Differential/Platelet ? ?4. Asthmatic bronchitis , chronic ([facility]) ?- CMP14+EGFR ?- CBC with Differential/Platelet ? ?5. Gastroesophageal reflux disease, unspecified whether esophagitis present ?- CMP14+EGFR ?- CBC with Differential/Platelet ? ?6. Hyperlipidemia associated with type 2 diabetes mellitus ([place]) ?- CMP14+EGFR ?- CBC with Differential/Platelet ? ?7. Hypothyroidism, unspecified type ?- CMP14+EGFR ?- CBC  with Differential/Platelet ? ?8. Type 2 diabetes mellitus with diabetic polyneuropathy, without long-term current use of insulin ([place]) ?- Bayer DCA Hb A1c Waived ?- CMP14+EGFR ?- CBC with Differential/Platelet ? ?9. Osteoarthritis of multiple joints, unspecified osteoarthritis type ?- CMP14+EGFR ?- CBC with Differential/Platelet ? ?10. Stage 3 chronic kidney disease, unspecified whether stage 3a or 3b CKD ([place]) ?- CMP14+EGFR ?- CBC with Differential/Platelet ? ?11. Anxiety ?- CMP14+EGFR ?- CBC with Differential/Platelet ? ?12. Moderate episode of recurrent major depressive disorder ([place]) ?- CMP14+EGFR ?- CBC with  Differential/Platelet ? ?13. Morbid obesity ([place]) ?- CMP14+EGFR ?- CBC with Differential/Platelet ? ?14. Bursitis of left elbow, unspecified bursa ?- CMP14+EGFR ?- CBC with Differential/Platelet ? ?15. Dysuria ?Urine pendin

## 2021-08-06 NOTE — Patient Instructions (Signed)
Elbow Bursitis  Elbow (olecranon) bursitis is the swelling of the fluid-filled sac (bursa) between the tip of your elbow and your skin. A bursa acts as a cushion and protects the joint. If the bursa becomes irritated, it can fill with extra fluid and become inflamed. This condition is also called olecranon bursitis. What are the causes? This condition may be caused by: An elbow injury, such as falling onto the elbow. Leaning on hard surfaces for long periods of time. Infection from an injury that breaks the skin near the elbow. A bone growth (spur) that forms at the tip of the elbow. A medical condition that causes inflammation, such as gout or rheumatoid arthritis. Sometimes the cause is not known. What are the signs or symptoms? The first sign of elbow bursitis is usually swelling at the tip of the elbow. This can grow to be about the size of a golf ball. Swelling may start suddenly or develop gradually. Other symptoms may include: Pain when bending or leaning on the elbow. Stiffness, or not being able to move the elbow normally. If bursitis is caused by an infection, you may have: Redness, warmth, and tenderness of the elbow. Drainage of pus from the swollen area over the elbow, if the skin breaks open. How is this diagnosed? This condition may be diagnosed based on: Your symptoms and medical history. Any recent injuries you have had. A physical exam. X-rays to check for a bone spur or fracture. Draining fluid from the bursa to test it for infection. Blood tests to rule out gout or rheumatoid arthritis. How is this treated? Treatment for this condition depends on the cause. Treatment may include: Medicines. These may include: Over-the-counter medicines to relieve pain and inflammation. Antibiotic medicines if there is an infection. Injections of anti-inflammatory medicines (steroids). Draining fluid from the bursa. Wrapping your elbow with a bandage or compression arm  sleeve. Wearing elbow pads. If these treatments do not help, you may need surgery to remove the bursa. Follow these instructions at home: Medicines Take over-the-counter and prescription medicines only as told by your health care provider. If you were prescribed an antibiotic medicine, take it as told by your health care provider. Do not stop taking the antibiotic even if you start to feel better. Managing pain, stiffness, and swelling     If directed, put ice on the affected area. To do this: Put ice in a plastic bag. Place a towel between your skin and the bag. Leave the ice on for 20 minutes, 2-3 times a day. Remove the ice if your skin turns bright red. This is very important. If you cannot feel pain, heat, or cold, you have a greater risk of damage to the area. If directed, apply heat to the affected area as often as told by your health care provider. Use the heat source that your health care provider recommends, such as a moist heat pack or a heating pad. Place a towel between your skin and the heat source. Leave the heat on 20-30 minutes. Remove the heat if your skin turns bright red. This is especially important if you are unable to feel pain, heat, or cold. You may have a greater risk of getting burned. If your bursitis is caused by an injury, rest your elbow and wear your bandage or sleeve as told by your health care provider. Use elbow pads or elbow wraps to cushion your elbow and control swelling as needed. General instructions Avoid any activities that cause elbow pain. Ask   your health care provider what activities are safe for you. Keep all follow-up visits. This is important. Contact a health care provider if: You have a fever. Your symptoms do not get better with treatment. You have pain or swelling that gets worse, or it goes away and then comes back. You have pus draining from your elbow. You have redness around the elbow area. Your elbow is warm to the touch. Get  help right away if: You have trouble moving your arm, hand, or fingers. Summary Elbow (olecranon) bursitis is the swelling of the fluid-filled sac (bursa) between the tip of your elbow and your skin. Treatment for elbow bursitis depends on the cause. It may include medicines to relieve pain and inflammation, antibiotic medicines to treat an infection, or draining fluid from your elbow. Contact a health care provider if your symptoms do not get better with treatment, or if your symptoms go away and then come back. This information is not intended to replace advice given to you by your health care provider. Make sure you discuss any questions you have with your health care provider. Document Revised: 04/02/2021 Document Reviewed: 04/02/2021 Elsevier Patient Education  2023 [employer]  

## 2021-08-07 ENCOUNTER — Ambulatory Visit (INDEPENDENT_AMBULATORY_CARE_PROVIDER_SITE_OTHER): Payer: Medicare Other | Admitting: Internal Medicine

## 2021-08-07 ENCOUNTER — Encounter: Payer: Self-pay | Admitting: Internal Medicine

## 2021-08-07 ENCOUNTER — Ambulatory Visit (HOSPITAL_COMMUNITY)
Admission: RE | Admit: 2021-08-07 | Discharge: 2021-08-07 | Disposition: A | Discharge status: Home / Self Care | Payer: Medicare Other | Source: Ambulatory Visit | Attending: Internal Medicine | Admitting: Internal Medicine

## 2021-08-07 DIAGNOSIS — J8410 Pulmonary fibrosis, unspecified: Secondary | ICD-10-CM

## 2021-08-07 DIAGNOSIS — J449 Chronic obstructive pulmonary disease, unspecified: Secondary | ICD-10-CM | POA: Diagnosis not present

## 2021-08-07 DIAGNOSIS — J4489 Other specified chronic obstructive pulmonary disease: Secondary | ICD-10-CM

## 2021-08-07 DIAGNOSIS — R0602 Shortness of breath: Secondary | ICD-10-CM | POA: Diagnosis not present

## 2021-08-07 LAB — CBC WITH DIFFERENTIAL/PLATELET
Basophils Absolute: 0.1 10*3/uL (ref 0.0–0.2)
Basos: 1 %
EOS (ABSOLUTE): 0.2 10*3/uL (ref 0.0–0.4)
Eos: 3 %
Hematocrit: 40.2 % (ref 34.0–46.6)
Hemoglobin: 12.9 g/dL (ref 11.1–15.9)
Immature Grans (Abs): 0 10*3/uL (ref 0.0–0.1)
Immature Granulocytes: 0 %
Lymphocytes Absolute: 1.6 10*3/uL (ref 0.7–3.1)
Lymphs: 20 %
MCH: 28 pg (ref 26.6–33.0)
MCHC: 32.1 g/dL (ref 31.5–35.7)
MCV: 87 fL (ref 79–97)
Monocytes Absolute: 0.8 10*3/uL (ref 0.1–0.9)
Monocytes: 10 %
Neutrophils Absolute: 5.2 10*3/uL (ref 1.4–7.0)
Neutrophils: 66 %
Platelets: 263 10*3/uL (ref 150–450)
RBC: 4.61 x10E6/uL (ref 3.77–5.28)
RDW: 14.2 % (ref 11.7–15.4)
WBC: 7.9 10*3/uL (ref 3.4–10.8)

## 2021-08-07 LAB — CMP14+EGFR
ALT: 18 IU/L (ref 0–32)
AST: 23 IU/L (ref 0–40)
Albumin/Globulin Ratio: 1.4 (ref 1.2–2.2)
Albumin: 4.1 g/dL (ref 3.6–4.6)
Alkaline Phosphatase: 75 IU/L (ref 44–121)
BUN/Creatinine Ratio: 9 — ABNORMAL LOW (ref 12–28)
BUN: 10 mg/dL (ref 8–27)
Bilirubin Total: 0.4 mg/dL (ref 0.0–1.2)
CO2: 25 mmol/L (ref 20–29)
Calcium: 10.2 mg/dL (ref 8.7–10.3)
Chloride: 102 mmol/L (ref 96–106)
Creatinine, Ser: 1.15 mg/dL — ABNORMAL HIGH (ref 0.57–1.00)
Globulin, Total: 3 g/dL (ref 1.5–4.5)
Glucose: 145 mg/dL — ABNORMAL HIGH (ref 70–99)
Potassium: 5 mmol/L (ref 3.5–5.2)
Sodium: 142 mmol/L (ref 134–144)
Total Protein: 7.1 g/dL (ref 6.0–8.5)
eGFR: 48 mL/min/{1.73_m2} — ABNORMAL LOW (ref >59)

## 2021-08-07 NOTE — Progress Notes (Signed)
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  She may also have obstructive sleep apnea but this is not been confirmed as she has not wanted a polysomnogram.  It appears that she is not been on maintenance prednisone therapy since August 2021.  She was seen here in a phone visit earlier this month and treated with a prednisone taper for possible flare in either her COPD, ILD or both.  She was continued on Incruse and supplemental oxygen at 3 L/min by nasal cannula.    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 She reports that the pred taper helped her breathing - now feeling back to baseline. She is following w Dr Trudie Reed, started her on prednisone '10mg'$  which she is now taking daily. She is on Incruse daily. She has albuterol available is using 0-1x a day. She remains on flonase, xyzal. Good compliance w her O2.   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 She is going to communicate w Dr Trudie Reed and continue if she agrees Continue flonase and xyzal.   History of Present Illness  03/19/2020   Pulmonary/ 1st office eval/Lorraine Leonard  Prednisone ? 5 mg daily (was on incruse but off x months) vaccine resistant Chief Complaint  Patient presents with   Follow-up    Patient states that she has had some wheezing for the last 4-5 days and coughing more. Wears 3 liters oxygen all the time.    Dyspnea:  Mostly housebound / very sedentary  Cough: more x one week  Sleep: bed is flat one pillow  SABA use: hfa has not used / rarely needed more than twice a week  Has not used in months  02 : runs in high 90s s 02 at rest but not checking with activity Sleeps on 3lpm   Rec Plan A = Automatic = Always=    BREO  200 one each am (you take as many drags as want but only one click)   - if better over the next 2 weeks then fill the BREO 076 one click each am.  Plan B = Backup (to supplement plan A, not to replace it) Only use your albuterol inhaler as a rescue medication Plan C = Crisis (instead of Plan B but only if Plan B stops working) - only use your albuterol nebulizer if you first try Plan B and it fails to help > ok to use the nebulizer up to every 4 hours but if start needing it regularly call for immediate appointment Plan D = Deltasone = Doubling  - deltasone is prednisone so double it if needing place C (the nebulizer)  much at all  Make sure you check your oxygen saturation at highest level of activity to be sure it stays over 90%    01/09/2021  f/u ov/Lorraine Leonard re: RA ILD/ AB / 02 dep maint on prednisone 5 mg x 2-3 weeks bid due to itching/ nasal drainage / no maint inhalers Chief Complaint  Patient presents with   Follow-up    SOB and cough are about the same as last visit. Sometimes coughs up clear/ yellow mucus. Only uses oxygen 3L O2 when needed at home.    Dyspnea:  mostly housebound / gets let off at door when goes anywhere / uses rollator at home  Cough: since last visit worse as day goes on / minimally productive mucoid  Sleeping: bed flat/ one pillow  SABA use: rarely  hfa/ almost never neb/ on prednisone 5 mg daily  02: 3lpm  Covid status: never vaxed, never infected  Rec Plan A  = automatic = Prednisone 5 mg each with breakfast   Plan B = Backup (to supplement plan A, not to replace it) Only use your albuterol inhaler as a rescue medication  Plan C = Crisis (instead of Plan B but only if Plan B stops working) - only use your albuterol nebulizer if you first try Plan B  Plan D =  Double the prednisone  - double it if needing place C (the nebulizer)  much at all or worse breathing or coughing or wheezing or arthritis and once better for a week drop back to 5 mg daily  Make sure you check your oxygen saturation at highest level of activity to be sure it stays over 90%        08/07/2021  f/u ov/ office/Lorraine Leonard re: RA/ILD  maint on prednisone 5  one twice daily   Chief Complaint  Patient presents with   Follow-up    Cough and sob have worsened. Having trouble being ambulatory.   Dyspnea:  room to room s 02 on rollator Cough: tickle daytime Sleeping: on side bed is flat  SABA use: not much  02: prn  Covid status: vax none  Was on abx x years for UTI ,  then switched to a pill (? Ongoing macrodantin exp)      No obvious day to day or daytime variability or assoc excess/ purulent sputum or mucus plugs or hemoptysis or cp or chest tightness, subjective wheeze or overt sinus or hb symptoms.   Sleeping  without nocturnal  or early am exacerbation  of respiratory  c/o's or need for noct saba. Also denies any obvious fluctuation of symptoms with weather or environmental changes or other aggravating or alleviating factors except as outlined above   No unusual exposure hx or h/o childhood pna/ asthma or knowledge of premature birth.  Current Allergies, Complete Past Medical History, Past Surgical History, Family History, and Social History were reviewed in [employer] record.  ROS  The following are not active complaints  unless bolded Hoarseness, sore throat, dysphagia, dental problems, itching, sneezing,  nasal congestion or discharge of excess mucus or purulent secretions, ear ache,   fever, chills, sweats, unintended wt loss or wt gain, classically pleuritic or exertional cp,  orthopnea pnd or arm/hand swelling  or leg swelling, presyncope, palpitations, abdominal pain, anorexia, nausea, vomiting, diarrhea  or change in bowel habits or change in bladder habits, change in stools or change in urine, dysuria, hematuria,  rash, arthralgias, visual complaints, headache, numbness, weakness or ataxia or  problems with walking or coordination,  change in mood or  memory.        Current Meds  Medication Sig   albuterol (VENTOLIN HFA) 108 (90 Base) MCG/ACT inhaler TAKE 2 PUFFS BY MOUTH EVERY 6 HOURS AS NEEDED FOR WHEEZE OR SHORTNESS OF BREATH   atenolol (TENORMIN) 25 MG tablet TAKE 1 TABLET (25 MG TOTAL) BY MOUTH DAILY.   calcium citrate (CALCITRATE - DOSED IN MG ELEMENTAL CALCIUM) 950 MG tablet Take 1 tablet by mouth 2 (two) times daily.    cyanocobalamin 1000 MCG tablet Take 1,000 mcg by mouth daily. Vitamin b12 every AM   diclofenac Sodium (VOLTAREN) 1 % GEL APPLY 4 GRAMS TOPICALLY 4 TIMES A DAY   ELIQUIS 5 MG TABS tablet TAKE 1 TABLET BY MOUTH TWICE A DAY   estradiol (ESTRACE VAGINAL) 0.1 MG/GM vaginal cream Place 1 Applicatorful vaginally at bedtime.   fluticasone (FLONASE) 50 MCG/ACT nasal spray PLACE 1 SPRAY INTO BOTH NOSTRILS 2 (TWO) TIMES DAILY AS NEEDED FOR ALLERGIES OR RHINITIS.   furosemide (LASIX) 40 MG tablet TAKE 1 TABLET BY MOUTH EVERY DAY   levocetirizine (XYZAL) 5 MG tablet TAKE 1 TABLET BY MOUTH EVERY DAY IN THE EVENING   levothyroxine (SYNTHROID) 112 MCG tablet TAKE 1 TABLET BY MOUTH EVERY DAY   metFORMIN (GLUCOPHAGE-XR) 500 MG 24 hr tablet TAKE 1 TABLET BY MOUTH EVERY DAY WITH BREAKFAST   methenamine (HIPREX) 1 g tablet Take 1 tablet (1 g total) by mouth 2 (two) times daily with a meal.   Multiple  Vitamins-Minerals (PRESERVISION AREDS 2) CAPS Take 1 capsule by mouth 2 (two) times daily.   Olopatadine HCl 0.2 % SOLN INSTILL 1 DROP INTO BOTH EYES EVERY DAY   omeprazole (PRILOSEC) 20 MG capsule Take 1 capsule (20 mg total) by mouth daily.   ondansetron (ZOFRAN ODT) 4 MG disintegrating tablet Take 1 tablet (4 mg total) by mouth every 8 (eight) hours as needed for nausea or vomiting.   OXYGEN Inhale 3 L into the lungs every evening.   Polyethyl Glycol-Propyl Glycol (SYSTANE) 0.4-0.3 % GEL ophthalmic gel Place 1 application into both eyes at bedtime as needed (dry eyes).   predniSONE (DELTASONE) 5 MG tablet Take 1 tablet (5 mg total) by mouth daily with breakfast.   rosuvastatin (CRESTOR) 10 MG tablet TAKE 1 TABLET BY MOUTH EVERYDAY AT BEDTIME   spironolactone (ALDACTONE) 25 MG tablet TAKE 1 TABLET (25 MG TOTAL) BY MOUTH DAILY.   vitamin C (ASCORBIC ACID) 500 MG tablet Take 1,000 mg by mouth daily. AM   Vitamin D, Ergocalciferol, (DRISDOL) 1.25 MG (50000 UNIT) CAPS capsule Take 1 capsule (50,000 Units total) by mouth every 7 (seven) days.            Past Medical History:  Diagnosis Date   Acute diastolic CHF (congestive heart failure) (HCC) 11/11/2016   Allergic rhinitis    Anxiety    Anxiety    Arthritis    "~ all my joints" (05/18/2017)   Atrial fibrillation ([place]) 09/2016   Atrial fibrillation with RVR ([place]) 05/18/2017   Chronic lower back pain    COPD (chronic obstructive pulmonary disease) (HCC)    Depression    Diverticulosis    Dyspnea    Dysrhythmia    GERD (gastroesophageal reflux disease)    Glaucoma    Gout    Heart disease    History of blood transfusion 1983   "when I gave a 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"   Hyperlipidemia  Hypertension    Hypothyroidism    Macular degeneration of both eyes    Neuropathy    On home oxygen therapy    "3L; 24/7" (05/18/2017)   Pre-diabetes    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    HEENT : nl  exam     NECK :  without JVD/Nodes/TM/ nl carotid upstrokes bilaterally   LUNGS: no acc muscle use,  slt kyphotic chest wall with distal isnp crackles bases  bilaterally without cough on insp or exp maneuvers   CV:  RRR  no s3 or murmur or increase in P2, and no edema   ABD:  soft and nontender with nl inspiratory excursion in the supine position. No bruits or organomegaly appreciated, bowel sounds nl  MS:  Nl gait/ ext warm without deformities, calf tenderness, cyanosis or clubbing No obvious joint restrictions   SKIN: warm and dry without lesions    NEURO:  alert, approp, nl sensorium with  no motor or cerebellar deficits apparent.      CXR PA and Lateral:   08/07/2021 :    I personally reviewed images and agree with radiology impression as follows:      Stable multi lead pacer apparatus overlies the left hemithorax. Stable cardiac and mediastinal contours. Scarring right mid lung. No pleural effusion or pneumothorax. Osseous structures unremarkable. Assessment

## 2021-08-07 NOTE — Patient Instructions (Addendum)
We will find out from [facility] the last time you took [facility]dantin and list an allergy  ? ?Make sure you check your oxygen saturation  AT  your highest level of activity (not after you stop)   to be sure it stays over 90% and adjust  02 flow upward to maintain this level if needed but remember to turn it back to previous settings when you stop (to conserve your supply).  ? ? ?Please remember to go to the  x-ray department  @  [facility] for your tests - we will call you with the results when they are available    ? ? ?Please schedule a follow up visit in 6 months but call sooner if needed  ? ? ? ? ? ?

## 2021-08-08 ENCOUNTER — Telehealth: Payer: Self-pay | Admitting: Internal Medicine

## 2021-08-08 ENCOUNTER — Other Ambulatory Visit: Payer: Self-pay | Admitting: Family

## 2021-08-08 ENCOUNTER — Encounter: Payer: Self-pay | Admitting: Internal Medicine

## 2021-08-08 LAB — URINE CULTURE

## 2021-08-08 MED ORDER — CEPHALEXIN 500 MG PO CAPS: 500.0000 mg | ORAL_CAPSULE | Freq: Two times a day (BID) | ORAL | 0 refills | Status: DC

## 2021-08-08 NOTE — Telephone Encounter (Signed)
Called and spoke with pt's granddaughter Chasity stating to her that MW wanted her to stop taking the macrodantin and to list it as an allergy and she verbalized understanding with this. She stated that PCP prescribed keflex to help treat UTI in place of that. Nothing further needed. ?

## 2021-08-09 NOTE — Telephone Encounter (Signed)
Called [facility] [place] and confirmed with pharmacy tech that last time patient had macrodantin filled was in 03/2019. Dr. Melvyn Novas verbally notified.  ?

## 2021-08-20 ENCOUNTER — Other Ambulatory Visit: Payer: Self-pay | Admitting: Family

## 2021-08-24 ENCOUNTER — Other Ambulatory Visit: Payer: Self-pay | Admitting: Family

## 2021-08-25 DIAGNOSIS — J449 Chronic obstructive pulmonary disease, unspecified: Secondary | ICD-10-CM | POA: Diagnosis not present

## 2021-08-25 DIAGNOSIS — J8410 Pulmonary fibrosis, unspecified: Secondary | ICD-10-CM | POA: Diagnosis not present

## 2021-08-25 DIAGNOSIS — I509 Heart failure, unspecified: Secondary | ICD-10-CM | POA: Diagnosis not present

## 2021-08-26 ENCOUNTER — Other Ambulatory Visit: Payer: Self-pay | Admitting: Family

## 2021-09-04 ENCOUNTER — Other Ambulatory Visit: Payer: Self-pay | Admitting: Family

## 2021-09-04 DIAGNOSIS — K219 Gastro-esophageal reflux disease without esophagitis: Secondary | ICD-10-CM

## 2021-09-04 DIAGNOSIS — E559 Vitamin D deficiency, unspecified: Secondary | ICD-10-CM

## 2021-09-04 DIAGNOSIS — E1142 Type 2 diabetes mellitus with diabetic polyneuropathy: Secondary | ICD-10-CM

## 2021-09-04 DIAGNOSIS — I152 Hypertension secondary to endocrine disorders: Secondary | ICD-10-CM

## 2021-09-24 ENCOUNTER — Ambulatory Visit (INDEPENDENT_AMBULATORY_CARE_PROVIDER_SITE_OTHER): Payer: Medicare Other

## 2021-09-24 DIAGNOSIS — I442 Atrioventricular block, complete: Secondary | ICD-10-CM | POA: Diagnosis not present

## 2021-09-24 LAB — CUP PACEART REMOTE DEVICE CHECK
Battery Remaining Longevity: 31 mo
Battery Voltage: 2.86 V
Brady Statistic AP VP Percent: 1.08 %
Brady Statistic AP VS Percent: 80.46 %
Brady Statistic AS VP Percent: 0 %
Brady Statistic AS VS Percent: 18.46 %
Brady Statistic RA Percent Paced: 98.05 %
Brady Statistic RV Percent Paced: 1.08 %
Date Time Interrogation Session: 20230605194814
Implantable Lead Implant Date: 20190128
Implantable Lead Implant Date: 20190128
Implantable Lead Location: 753860
Implantable Lead Location: 753860
Implantable Lead Model: 3830
Implantable Lead Model: 5076
Implantable Pulse Generator Implant Date: 20190128
Lead Channel Impedance Value: 323 Ohm
Lead Channel Impedance Value: 342 Ohm
Lead Channel Impedance Value: 399 Ohm
Lead Channel Impedance Value: 399 Ohm
Lead Channel Sensing Intrinsic Amplitude: 11.125 mV
Lead Channel Sensing Intrinsic Amplitude: 11.125 mV
Lead Channel Sensing Intrinsic Amplitude: 2.625 mV
Lead Channel Sensing Intrinsic Amplitude: 2.625 mV
Lead Channel Setting Pacing Amplitude: 2.5 V
Lead Channel Setting Pacing Amplitude: 3.5 V
Lead Channel Setting Pacing Pulse Width: 0.4 ms
Lead Channel Setting Sensing Sensitivity: 0.9 mV

## 2021-09-25 DIAGNOSIS — I509 Heart failure, unspecified: Secondary | ICD-10-CM | POA: Diagnosis not present

## 2021-09-25 DIAGNOSIS — J449 Chronic obstructive pulmonary disease, unspecified: Secondary | ICD-10-CM | POA: Diagnosis not present

## 2021-09-25 DIAGNOSIS — J8410 Pulmonary fibrosis, unspecified: Secondary | ICD-10-CM | POA: Diagnosis not present

## 2021-09-26 ENCOUNTER — Encounter: Payer: Self-pay | Admitting: Cardiology

## 2021-09-26 ENCOUNTER — Ambulatory Visit (INDEPENDENT_AMBULATORY_CARE_PROVIDER_SITE_OTHER): Payer: Medicare Other | Admitting: Cardiology

## 2021-09-26 VITALS — BP 130/70 | HR 87 | Ht 67.0 in | Wt 209.0 lb

## 2021-09-26 DIAGNOSIS — D6869 Other thrombophilia: Secondary | ICD-10-CM | POA: Diagnosis not present

## 2021-09-26 DIAGNOSIS — Z950 Presence of cardiac pacemaker: Secondary | ICD-10-CM

## 2021-09-26 DIAGNOSIS — I5032 Chronic diastolic (congestive) heart failure: Secondary | ICD-10-CM

## 2021-09-26 DIAGNOSIS — I4891 Unspecified atrial fibrillation: Secondary | ICD-10-CM | POA: Diagnosis not present

## 2021-09-26 DIAGNOSIS — I10 Essential (primary) hypertension: Secondary | ICD-10-CM

## 2021-09-26 NOTE — Progress Notes (Signed)
Clinical Summary Lorraine Leonard is a 81 y.o.female seen today for follow up of the following medical problems.    1. Afib/S/p AV nodal ablation with pacemaker - followed by EP. Failed rate control due to bradycardia. Unable to maintain rhythm control - she is s/p AV node ablation with pacemaker placement by EP.      - infrequent palpitations - no bleeding on eliquis.        2. Chronic diastolic HF - 83/3825 echo LVEF 55-60%, no WMAs- - not checking home weights. By clinic weights up 8 lbs since last month. Has had some increased LE edema, some increased DOE      - no recent edema.  - home scale weight 205-207 lbs.  - last Cr 1.15 and stable     3. Pulmonary fibrosis - followed by pulmionary - on home O2 just as needed     Past Medical History:  Diagnosis Date   Acute diastolic CHF (congestive heart failure) (HCC) 11/11/2016   Allergic rhinitis    Anxiety    Anxiety    Arthritis    "~ all my joints" (05/18/2017)   Atrial fibrillation ([place]) 09/2016   Atrial fibrillation with RVR ([place]) 05/18/2017   Chronic lower back pain    COPD (chronic obstructive pulmonary disease) (HCC)    Depression    Diverticulosis    Dyspnea    Dysrhythmia    GERD (gastroesophageal reflux disease)    Glaucoma    Gout    Heart disease    History of blood transfusion 1983   "when I gave a 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"   Hyperlipidemia    Hypertension    Hypothyroidism    Macular degeneration of both eyes    Neuropathy    On home oxygen therapy    "3L; 24/7" (05/18/2017)   Pre-diabetes    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   Pulmonary fibrosis (HCC)    Pulmonary fibrosis (HCC)    Rheumatoid arthritis (HCC)    Sleep apnea    uses 3 liters of o2 24/7   Urticaria      Allergies  Allergen Reactions   Allopurinol Swelling    Feet and legs swell   Mobic [Meloxicam] Swelling and Other (See Comments)    Feet and Leg swelling   Macrodantin [Nitrofurantoin Macrocrystal]       Current Outpatient Medications  Medication Sig Dispense Refill   albuterol (VENTOLIN HFA) 108 (90 Base) MCG/ACT inhaler TAKE 2 PUFFS BY MOUTH EVERY 6 HOURS AS NEEDED FOR WHEEZE OR SHORTNESS OF BREATH 8.5 each 4   atenolol (TENORMIN) 25 MG tablet TAKE 1 TABLET (25 MG TOTAL) BY MOUTH DAILY. 90 tablet 1   calcium citrate (CALCITRATE - DOSED IN MG ELEMENTAL CALCIUM) 950 MG tablet Take 1 tablet by mouth 2 (two) times daily.      cyanocobalamin 1000 MCG tablet Take 1,000 mcg by mouth daily. Vitamin b12 every AM     diclofenac Sodium (VOLTAREN) 1 % GEL APPLY 4 GRAMS TOPICALLY 4 TIMES A DAY 400 g 1   ELIQUIS 5 MG TABS tablet TAKE 1 TABLET BY MOUTH TWICE A DAY 180 tablet 0   estradiol (ESTRACE VAGINAL) 0.1 MG/GM vaginal cream Place 1 Applicatorful vaginally at bedtime. 42.5 g 12   fluticasone (FLONASE) 50 MCG/ACT nasal spray PLACE 1 SPRAY INTO BOTH NOSTRILS 2 (TWO) TIMES DAILY AS NEEDED FOR ALLERGIES OR RHINITIS. 48 mL 1   furosemide (LASIX) 40 MG tablet TAKE 1  TABLET BY MOUTH EVERY DAY 90 tablet 0   levocetirizine (XYZAL) 5 MG tablet TAKE 1 TABLET BY MOUTH EVERY DAY IN THE EVENING 90 tablet 0   levothyroxine (SYNTHROID) 112 MCG tablet TAKE 1 TABLET BY MOUTH EVERY DAY 90 tablet 2   metFORMIN (GLUCOPHAGE-XR) 500 MG 24 hr tablet TAKE 1 TABLET BY MOUTH EVERY DAY WITH BREAKFAST 90 tablet 0   methenamine (HIPREX) 1 g tablet Take 1 tablet (1 g total) by mouth 2 (two) times daily with a meal. 180 tablet 2   Multiple Vitamins-Minerals (PRESERVISION AREDS 2) CAPS Take 1 capsule by mouth 2 (two) times daily.     Olopatadine HCl 0.2 % SOLN INSTILL 1 DROP INTO BOTH EYES EVERY DAY 10 mL 5   omeprazole (PRILOSEC) 20 MG capsule TAKE 1 CAPSULE BY MOUTH EVERY DAY 90 capsule 0   ondansetron (ZOFRAN ODT) 4 MG disintegrating tablet Take 1 tablet (4 mg total) by mouth every 8 (eight) hours as needed for nausea or vomiting. 20 tablet 0   OXYGEN Inhale 3 L into the lungs every evening.     Polyethyl Glycol-Propyl Glycol  (SYSTANE) 0.4-0.3 % GEL ophthalmic gel Place 1 application into both eyes at bedtime as needed (dry eyes).     predniSONE (DELTASONE) 5 MG tablet TAKE 1 TABLET BY MOUTH EVERY DAY WITH BREAKFAST 90 tablet 2   rosuvastatin (CRESTOR) 10 MG tablet TAKE 1 TABLET BY MOUTH EVERYDAY AT BEDTIME 90 tablet 0   sertraline (ZOLOFT) 50 MG tablet Take 50 mg by mouth daily.     spironolactone (ALDACTONE) 25 MG tablet TAKE 1 TABLET (25 MG TOTAL) BY MOUTH DAILY. 90 tablet 0   vitamin C (ASCORBIC ACID) 500 MG tablet Take 1,000 mg by mouth daily. AM     Vitamin D, Ergocalciferol, (DRISDOL) 1.25 MG (50000 UNIT) CAPS capsule TAKE 1 CAPSULE (50,000 UNITS TOTAL) BY MOUTH EVERY 7 (SEVEN) DAYS 12 capsule 0   cephALEXin (KEFLEX) 500 MG capsule Take 1 capsule (500 mg total) by mouth 2 (two) times daily. 14 capsule 0   No current facility-administered medications for this visit.     Past Surgical History:  Procedure Laterality Date   AV NODE ABLATION  05/18/2017   AV NODE ABLATION N/A 05/18/2017   Procedure: AV NODE ABLATION;  Surgeon: Evans Lance, MD;  Location: [facility];  Service: Cardiovascular;  Laterality: N/A;   BACK SURGERY     BREAST LUMPECTOMY Left    CARDIOVERSION N/A 01/30/2017   Procedure: CARDIOVERSION;  Surgeon: Josue Hector, MD;  [facility];  Service: Cardiovascular;  Laterality: N/A;   CARDIOVERSION N/A 04/09/2017   Procedure: CARDIOVERSION;  Surgeon: Satira Sark, MD;  Location: [facility];  Service: Cardiovascular;  Laterality: N/A;   CATARACT EXTRACTION W/ INTRAOCULAR LENS  IMPLANT, BILATERAL Bilateral    DILATION AND CURETTAGE OF UTERUS     EUS N/A 10/03/2015   Procedure: UPPER ENDOSCOPIC ULTRASOUND (EUS) RADIAL;  Surgeon: Arta Silence, MD;  Location: [facility];  Service: Endoscopy;  Laterality: N/A;   FINE NEEDLE ASPIRATION N/A 10/03/2015   Procedure: FINE NEEDLE ASPIRATION (FNA) RADIAL;  Surgeon: Arta Silence, MD;  Location: [facility];  Service:  Endoscopy;  Laterality: N/A;   INSERT / REPLACE / REMOVE PACEMAKER  05/18/2017   LUMBAR LAMINECTOMY  1995; 06/23/2008   L4-5/notes 08/20/2010   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   NEPHRECTOMY TRANSPLANTED ORGAN     PACEMAKER IMPLANT  N/A 05/18/2017   Procedure: PACEMAKER IMPLANT;  Surgeon: Evans Lance, MD;  Location: [facility];  Service: Cardiovascular;  Laterality: N/A;   TOTAL ABDOMINAL HYSTERECTOMY     TUBAL LIGATION       Allergies  Allergen Reactions   Allopurinol Swelling    Feet and legs swell   Mobic [Meloxicam] Swelling and Other (See Comments)    Feet and Leg swelling   Macrodantin [Nitrofurantoin Macrocrystal]       Family History  Problem Relation Age of Onset   Diabetes Other    Hypertension Other    Coronary artery disease Other    Stroke Other    Asthma Other    Kidney disease Sister    Cancer Sister        lung   Lung cancer Sister    Cancer Sister        lung   Lung cancer Sister    Cancer Sister        lung   Suicidality Son        committed suicide in 2009   CVA Son    Hyperlipidemia Other    Anxiety disorder Other    Migraines Other    Heart failure Father    Healthy Brother    Cancer Sister        lung   Healthy Sister      Social History Ms. Humiston reports that she has never smoked. She has never used smokeless tobacco. Ms. Lucatero reports no history of alcohol use.   Review of Systems CONSTITUTIONAL: No weight loss, fever, chills, weakness or fatigue.  HEENT: Eyes: No visual loss, blurred vision, double vision or yellow sclerae.No hearing loss, sneezing, congestion, runny nose or sore throat.  SKIN: No rash or itching.  CARDIOVASCULAR: per hpi RESPIRATORY: No shortness of breath, cough or sputum.  GASTROINTESTINAL: No anorexia, nausea, vomiting or diarrhea. No abdominal pain or blood.  GENITOURINARY: No burning on urination, no polyuria NEUROLOGICAL: No headache, dizziness, syncope, paralysis,  ataxia, numbness or tingling in the extremities. No change in bowel or bladder control.  MUSCULOSKELETAL: No muscle, back pain, joint pain or stiffness.  LYMPHATICS: No enlarged nodes. No history of splenectomy.  PSYCHIATRIC: No history of depression or anxiety.  ENDOCRINOLOGIC: No reports of sweating, cold or heat intolerance. No polyuria or polydipsia.  Marland Kitchen   Physical Examination Vitals:   09/26/21 0944  BP: (!) 148/76  Pulse: 87  SpO2: 95%   Filed Weights   09/26/21 0944  Weight: 209 lb (94.8 kg)    Gen: resting comfortably, no acute distress HEENT: no scleral icterus, pupils equal round and reactive, no palptable cervical adenopathy,  CV: RRR, no m/r/g no jvd Resp: Clear to auscultation bilaterally GI: abdomen is soft, non-tender, non-distended, normal bowel sounds, no hepatosplenomegaly MSK: extremities are warm, no edema.  Skin: warm, no rash Neuro:  no focal deficits Psych: appropriate affect   Diagnostic Studies  11/2015 echo Study Conclusions   - Left ventricle: The cavity size was normal. There was severe   focal basal hypertrophy of the septum. Systolic function was   vigorous. The estimated ejection fraction was in the range of 65%   to 70%. Wall motion was normal; there were no regional wall   motion abnormalities. - Mitral valve: Moderately calcified annulus. There was mild   regurgitation. - Left atrium: Volume/bsa, ES (1-plane Simpson&'s, A4C): 26.5   ml/m^2. - Right ventricle: The cavity size was normal. Wall thickness was  mildly increased. - Pulmonary arteries: Systolic pressure was within the normal   range.     09/2016 echo Study Conclusions   - Left ventricle: The cavity size was normal. Wall thickness was   increased in a pattern of mild LVH. Systolic function was normal.   The estimated ejection fraction was in the range of 60% to 65%.   Wall motion was normal; there were no regional wall motion   abnormalities. Features are consistent  with a pseudonormal left   ventricular filling pattern, with concomitant abnormal relaxation   and increased filling pressure (grade 2 diastolic dysfunction). - Aortic valve: Mildly calcified annulus. Trileaflet. - Mitral valve: Calcified annulus. - Right atrium: Central venous pressure (est): 3 mm Hg. - Atrial septum: No defect or patent foramen ovale was identified. - Tricuspid valve: There was trivial regurgitation. - Pulmonary arteries: PA peak pressure: 25 mm Hg (S). - Pericardium, extracardiac: A prominent pericardial fat pad was   present.   Impressions:   - Mild LVH with LVEF 60-65% and grade 2 diastolic dysfunction.   Calcified mitral annulus. Mildly sclerotic aortic valve. Trivial   tricuspid regurgitation with normal PASP estimated 25 mmHg.   Prominent pericardial fat pad noted.   Assessment and Plan  1. Afib/acquired thrombophilia - no symptoms, continue current meds - no bleeding on eliquis, will continue for stroke prevention    2. Chronic diastolic HF -euvolemic without symptoms, continue lasix '40mg'$  daily  3. Pacemaker - recent normal device check, continue to monitor  4.HTN -at goal by manual recheck, continue  F/u 6 months      Arnoldo Lenis, M.D.

## 2021-09-26 NOTE — Patient Instructions (Addendum)

## 2021-10-01 LAB — HM DIABETES EYE EXAM

## 2021-10-08 DIAGNOSIS — H5213 Myopia, bilateral: Secondary | ICD-10-CM | POA: Diagnosis not present

## 2021-10-08 NOTE — Progress Notes (Signed)
Remote pacemaker transmission.   

## 2021-10-14 ENCOUNTER — Telehealth: Payer: Self-pay | Admitting: Family

## 2021-10-14 MED ORDER — PREDNISONE 5 MG PO TABS: 5.0000 mg | ORAL_TABLET | Freq: Two times a day (BID) | ORAL | 2 refills | Status: DC

## 2021-10-25 DIAGNOSIS — J8410 Pulmonary fibrosis, unspecified: Secondary | ICD-10-CM | POA: Diagnosis not present

## 2021-10-25 DIAGNOSIS — J449 Chronic obstructive pulmonary disease, unspecified: Secondary | ICD-10-CM | POA: Diagnosis not present

## 2021-10-25 DIAGNOSIS — I509 Heart failure, unspecified: Secondary | ICD-10-CM | POA: Diagnosis not present

## 2021-10-27 ENCOUNTER — Other Ambulatory Visit: Payer: Self-pay | Admitting: Family

## 2021-10-27 DIAGNOSIS — J449 Chronic obstructive pulmonary disease, unspecified: Secondary | ICD-10-CM

## 2021-11-04 DIAGNOSIS — H52223 Regular astigmatism, bilateral: Secondary | ICD-10-CM | POA: Diagnosis not present

## 2021-11-04 DIAGNOSIS — H524 Presbyopia: Secondary | ICD-10-CM | POA: Diagnosis not present

## 2021-11-12 ENCOUNTER — Encounter: Payer: Self-pay | Admitting: Family

## 2021-11-12 ENCOUNTER — Ambulatory Visit (INDEPENDENT_AMBULATORY_CARE_PROVIDER_SITE_OTHER): Payer: Medicare Other | Admitting: Family

## 2021-11-12 VITALS — BP 122/59 | HR 73 | Temp 97.6°F | Ht 67.0 in | Wt 207.6 lb

## 2021-11-12 DIAGNOSIS — I5032 Chronic diastolic (congestive) heart failure: Secondary | ICD-10-CM

## 2021-11-12 DIAGNOSIS — E039 Hypothyroidism, unspecified: Secondary | ICD-10-CM | POA: Diagnosis not present

## 2021-11-12 DIAGNOSIS — L851 Acquired keratosis [keratoderma] palmaris et plantaris: Secondary | ICD-10-CM | POA: Diagnosis not present

## 2021-11-12 DIAGNOSIS — F419 Anxiety disorder, unspecified: Secondary | ICD-10-CM

## 2021-11-12 DIAGNOSIS — E1159 Type 2 diabetes mellitus with other circulatory complications: Secondary | ICD-10-CM

## 2021-11-12 DIAGNOSIS — I152 Hypertension secondary to endocrine disorders: Secondary | ICD-10-CM

## 2021-11-12 DIAGNOSIS — F331 Major depressive disorder, recurrent, moderate: Secondary | ICD-10-CM

## 2021-11-12 DIAGNOSIS — E669 Obesity, unspecified: Secondary | ICD-10-CM

## 2021-11-12 DIAGNOSIS — J449 Chronic obstructive pulmonary disease, unspecified: Secondary | ICD-10-CM | POA: Diagnosis not present

## 2021-11-12 DIAGNOSIS — M159 Polyosteoarthritis, unspecified: Secondary | ICD-10-CM | POA: Diagnosis not present

## 2021-11-12 DIAGNOSIS — K5901 Slow transit constipation: Secondary | ICD-10-CM

## 2021-11-12 DIAGNOSIS — I5031 Acute diastolic (congestive) heart failure: Secondary | ICD-10-CM

## 2021-11-12 DIAGNOSIS — J8410 Pulmonary fibrosis, unspecified: Secondary | ICD-10-CM

## 2021-11-12 DIAGNOSIS — E1169 Type 2 diabetes mellitus with other specified complication: Secondary | ICD-10-CM

## 2021-11-12 DIAGNOSIS — J9611 Chronic respiratory failure with hypoxia: Secondary | ICD-10-CM

## 2021-11-12 DIAGNOSIS — N183 Chronic kidney disease, stage 3 unspecified: Secondary | ICD-10-CM

## 2021-11-12 DIAGNOSIS — E785 Hyperlipidemia, unspecified: Secondary | ICD-10-CM

## 2021-11-12 DIAGNOSIS — E1142 Type 2 diabetes mellitus with diabetic polyneuropathy: Secondary | ICD-10-CM | POA: Diagnosis not present

## 2021-11-12 DIAGNOSIS — D485 Neoplasm of uncertain behavior of skin: Secondary | ICD-10-CM | POA: Diagnosis not present

## 2021-11-12 LAB — BAYER DCA HB A1C WAIVED: HB A1C (BAYER DCA - WAIVED): 6.5 % — ABNORMAL HIGH (ref 4.8–5.6)

## 2021-11-12 NOTE — Progress Notes (Signed)
Subjective:    Patient ID: Lorraine Leonard, female    DOB: 06-12-1940, 81 y.o.   MRN: [id number]  Chief Complaint  Patient presents with   Medication Management   Pt presents to the office today for chronic follow up.   She is followed by Cardiologists every 6 months for CHF & A Fib. She is followed by Pulmonologist annually for pulmonary hypertension, COPD, and Pulmonary Fibrosis. She was told to decrease her oxygen to as needed. Pt is not on oxygen today.  She reports her breathing is stable, but SOB with exertion.   She is followed by Nephrologists as needed for CKD. Avoids NSAID's.  She is followed by Urologists for recurrent UTI's annually.   Complaining of a scaly skin lesion on her face for the last year that will not go away.  Hypertension This is a chronic problem. The current episode started more than 1 year ago. The problem has been resolved since onset. The problem is controlled. Associated symptoms include anxiety, malaise/fatigue and shortness of breath. Pertinent negatives include no blurred vision or peripheral edema. Risk factors for coronary artery disease include dyslipidemia, diabetes mellitus, obesity and sedentary lifestyle. The current treatment provides moderate improvement. Hypertensive end-organ damage includes heart failure.  Congestive Heart Failure Presents for follow-up visit. Associated symptoms include fatigue and shortness of breath. Pertinent negatives include no edema. The symptoms have been stable.  Diabetes She presents for her follow-up diabetic visit. She has type 2 diabetes mellitus. Hypoglycemia symptoms include nervousness/anxiousness. Associated symptoms include fatigue and foot paresthesias. Pertinent negatives for diabetes include no blurred vision. Symptoms are stable. Diabetic complications include heart disease, nephropathy and peripheral neuropathy. Risk factors for coronary artery disease include dyslipidemia, diabetes mellitus, hypertension,  sedentary lifestyle and post-menopausal. She is following a generally healthy diet. (Does not check BS at home)  Hyperlipidemia This is a chronic problem. The current episode started more than 1 year ago. Exacerbating diseases include obesity. Associated symptoms include shortness of breath. Current antihyperlipidemic treatment includes statins. The current treatment provides moderate improvement of lipids. Risk factors for coronary artery disease include diabetes mellitus, dyslipidemia, hypertension, a sedentary lifestyle and post-menopausal.  Arthritis Presents for follow-up visit. She complains of pain and stiffness. The symptoms have been stable. Affected locations include the left knee, right knee, right foot, left foot, left shoulder, right hip and left hip (back). Her pain is at a severity of 3/10. Associated symptoms include fatigue.  Depression        This is a chronic problem.  The current episode started more than 1 year ago.   The onset quality is gradual.   Associated symptoms include fatigue, restlessness, decreased interest and sad.  Associated symptoms include no helplessness and no hopelessness.  Past medical history includes anxiety.   Anxiety Presents for follow-up visit. Symptoms include depressed mood, excessive worry, irritability, nervous/anxious behavior, restlessness and shortness of breath. Symptoms occur occasionally.    Constipation This is a chronic problem. The current episode started more than 1 year ago. The problem has been resolved since onset. Her stool frequency is 1 time per day. Risk factors include obesity. She has tried laxatives for the symptoms. The treatment provided moderate relief.      Review of Systems  Constitutional:  Positive for fatigue, irritability and malaise/fatigue.  Eyes:  Negative for blurred vision.  Respiratory:  Positive for shortness of breath.   Gastrointestinal:  Positive for constipation.  Musculoskeletal:  Positive for arthritis  and stiffness.  Psychiatric/Behavioral:  Positive for depression. The patient is nervous/anxious.   All other systems reviewed and are negative.      Objective:   Physical Exam Vitals reviewed.  Constitutional:      General: She is not in acute distress.    Appearance: She is well-developed. She is obese.  HENT:     Head: Normocephalic and atraumatic.      Comments: Papule approx 1X1 cm that is erythemas and scaly     Right Ear: Tympanic membrane normal.     Left Ear: Tympanic membrane normal.  Eyes:     Pupils: Pupils are equal, round, and reactive to light.  Neck:     Thyroid: No thyromegaly.  Cardiovascular:     Rate and Rhythm: Normal rate and regular rhythm.     Heart sounds: Normal heart sounds. No murmur heard. Pulmonary:     Effort: Pulmonary effort is normal. No respiratory distress.     Breath sounds: Normal breath sounds. No wheezing.  Abdominal:     General: Bowel sounds are normal. There is no distension.     Palpations: Abdomen is soft.     Tenderness: There is no abdominal tenderness.  Musculoskeletal:        General: No tenderness. Normal range of motion.     Cervical back: Normal range of motion and neck supple.  Skin:    General: Skin is warm and dry.  Neurological:     Mental Status: She is alert and oriented to person, place, and time.     Cranial Nerves: No cranial nerve deficit.     Deep Tendon Reflexes: Reflexes are normal and symmetric.  Psychiatric:        Behavior: Behavior normal.        Thought Content: Thought content normal.        Judgment: Judgment normal.    Cryotherapy used on skin lesion for 8-10 seconds. Pt tolerated well.    BP (!) 122/59   Pulse 73   Temp 97.6 F (36.4 C)   Ht _0  (1.702 m)   Wt 207 lb 9.6 oz (94.2 kg)   SpO2 96%   BMI 32.51 kg/m      Assessment & Plan:  Lorraine Leonard comes in today with chief complaint of Medication Management   Diagnosis and orders addressed:  1. Hypertension associated with  diabetes ([facility]) - CMP14+EGFR - CBC with Differential/Platelet  2. Acute diastolic CHF (congestive heart failure) ([facility]) - CMP14+EGFR - CBC with Differential/Platelet  3. Chronic diastolic heart failure ([facility]) - CMP14+EGFR - CBC with Differential/Platelet  4. Pulmonary fibrosis ([facility]CC) - CMP14+EGFR - CBC with Differential/Platelet  5. Chronic respiratory failure with hypoxia ([facility]CC) - CMP14+EGFR - CBC with Differential/Platelet  6. Chronic obstructive pulmonary disease, unspecified COPD type ([facility]) - CMP14+EGFR - CBC with Differential/Platelet  7. Hypothyroidism, unspecified type - CMP14+EGFR - CBC with Differential/Platelet - TSH  8. Hyperlipidemia associated with type 2 diabetes mellitus ([facility]CC) - CMP14+EGFR - CBC with Differential/Platelet  9. Type 2 diabetes mellitus with diabetic polyneuropathy, without long-term current use of insulin ([facility]CC) - CMP14+EGFR - CBC with Differential/Platelet - Bayer DCA Hb A1c Waived  10. Osteoarthritis of multiple joints, unspecified osteoarthritis type - CMP14+EGFR - CBC with Differential/Platelet  11. Stage 3 chronic kidney disease, unspecified whether stage 3a or 3b CKD ([facility]CC) - CMP14+EGFR - CBC with Differential/Platelet  12. Moderate episode of recurrent major depressive disorder (HCC) - CMP14+EGFR - CBC with Differential/Platelet  13. Anxiety - CMP14+EGFR - CBC with Differential/Platelet  14. Slow transit constipation - CMP14+EGFR - CBC with Differential/Platelet  15. Obesity (BMI 30-39.9) - CMP14+EGFR - CBC with Differential/Platelet  16. Acral keratosis Cryotherapy used   Labs pending Health Maintenance reviewed Diet and exercise encouraged  Follow up plan: 3 months    Evelina Dun, FNP

## 2021-11-12 NOTE — Patient Instructions (Signed)
Actinic Keratosis An actinic keratosis is a precancerous growth on the skin. If there is more than one growth, the condition is called actinic keratoses. Actinic keratoses appear most often on areas of skin that get a lot of sun exposure, including the scalp, face, ears, lips, upper back, forearms, and the backs of the hands. If left untreated, these growths may develop into a skin cancer called squamous cell carcinoma. It is important to have all these growths checked by a health care provider to determine the best treatment approach. What are the causes? Actinic keratoses are caused by getting too much ultraviolet (UV) radiation from the sun or other UV light sources. What increases the risk? You are more likely to develop this condition if you: Have light-colored skin and blue eyes. Have blond or red hair. Spend a lot of time in the sun. Do not protect your skin from the sun when outdoors. Are an older person. The risk of developing an actinic keratosis increases with age. What are the signs or symptoms? Actinic keratoses feel like scaly, rough spots of skin. Symptoms of this condition include growths that may: Be as small as a pinhead or as big as a quarter. Itch, hurt, or feel sensitive. Be skin-colored, light tan, dark tan, pink, or a combination of any of these colors. In most cases, the growths become red. Have a small piece of pink or gray skin (skin tag) growing from them. It may be easier to notice actinic keratoses by feeling them, rather than seeing them. Sometimes, actinic keratoses disappear, but many reappear a few days to a few weeks later. How is this diagnosed? This condition is usually diagnosed with a physical exam. A tissue sample may be removed from the actinic keratosis and examined under a microscope (biopsy). How is this treated? If needed, this condition may be treated by: Scraping off the actinic keratosis (curettage). Freezing the actinic keratosis with liquid  nitrogen (cryosurgery). This causes the growth to eventually fall off the skin. Applying medicated creams or gels to destroy the cells in the growth. Applying chemicals to the actinic keratosis to make the outer layers of skin peel off (chemical peel). Using photodynamic therapy. In this procedure, medicated cream is applied to the actinic keratosis. This cream increases your skin's sensitivity to light. Then, a strong light is aimed at the actinic keratosis to destroy cells in the growth. Follow these instructions at home: Skin care Apply cool, wet cloths (cool compresses) to the affected areas. Do not scratch your skin. Check your skin regularly for any growths, especially growths that: Start to itch or bleed. Change in size, shape, or color. Caring for the treated area Keep the treated area clean and dry as told by your health care provider. Do not apply any medicine, cream, or lotion to the treated area unless your health care provider tells you to do that. Do not pick at blisters or try to break them open. This can cause infection and scarring. If you have red or irritated skin after treatment, follow instructions from your health care provider about how to take care of the treated area. Make sure you: Wash your hands with soap and water before you change your bandage (dressing). If soap and water are not available, use hand sanitizer. Change your dressing as told by your health care provider. If you have red or irritated skin after treatment, check your treated area every day for signs of infection. Check for: Redness, swelling, or pain. Fluid or blood.  Warmth. Pus or a bad smell. General instructions Take or apply over-the-counter and prescription medicines only as told by your health care provider. Return to your normal activities as told by your health care provider. Ask your health care provider what activities are safe for you. Have a skin exam done every year by a health care  provider who is a skin specialist (dermatologist). Keep all follow-up visits as told by your health care provider. This is important. Lifestyle Do not use any products that contain nicotine or tobacco, such as cigarettes and e-cigarettes. If you need help quitting, ask your health care provider. Take steps to protect your skin from the sun. Try to avoid the sun between 10:00 a.m. and 4:00 p.m. This is when the UV light is the strongest. Use a sunscreen or sunblock with SPF 30 (sun protection factor 30) or greater. Apply sunscreen before you are exposed to sunlight and reapply as often as directed by the instructions on the sunscreen container. Always wear sunglasses that have UV protection, and always wear a hat and clothing to protect your skin from sunlight. When possible, avoid medicines that increase your sensitivity to sunlight. Do not use tanning beds or other indoor tanning devices. Contact a health care provider if: You notice any changes or new growths on your skin. You have swelling, pain, or more redness around your treated area. You have fluid or blood coming from your treated area. Your treated area feels warm to the touch. You have pus or a bad smell coming from your treated area. You have a fever. You have a blister that becomes large and painful. Summary An actinic keratosis is a precancerous growth on the skin. If there is more than one growth, the condition is called actinic keratoses. In some cases, if left untreated, these growths can develop into skin cancer. Check your skin regularly for any growths, especially growths that start to itch or bleed, or change in size, shape, or color. Take steps to protect your skin from the sun. Contact a health care provider if you notice any changes or new growths on your skin. Keep all follow-up visits as told by your health care provider. This is important. This information is not intended to replace advice given to you by your  health care provider. Make sure you discuss any questions you have with your health care provider. Document Revised: 02/19/2021 Document Reviewed: 01/30/2021 Elsevier Patient Education  [employer].

## 2021-11-13 LAB — CBC WITH DIFFERENTIAL/PLATELET
Basophils Absolute: 0.1 10*3/uL (ref 0.0–0.2)
Basos: 1 %
EOS (ABSOLUTE): 0.1 10*3/uL (ref 0.0–0.4)
Eos: 1 %
Hematocrit: 37.8 % (ref 34.0–46.6)
Hemoglobin: 12.6 g/dL (ref 11.1–15.9)
Immature Grans (Abs): 0.1 10*3/uL (ref 0.0–0.1)
Immature Granulocytes: 1 %
Lymphocytes Absolute: 1.6 10*3/uL (ref 0.7–3.1)
Lymphs: 14 %
MCH: 29.4 pg (ref 26.6–33.0)
MCHC: 33.3 g/dL (ref 31.5–35.7)
MCV: 88 fL (ref 79–97)
Monocytes Absolute: 1 10*3/uL — ABNORMAL HIGH (ref 0.1–0.9)
Monocytes: 9 %
Neutrophils Absolute: 9 10*3/uL — ABNORMAL HIGH (ref 1.4–7.0)
Neutrophils: 74 %
Platelets: 281 10*3/uL (ref 150–450)
RBC: 4.29 x10E6/uL (ref 3.77–5.28)
RDW: 13.8 % (ref 11.7–15.4)
WBC: 11.8 10*3/uL — ABNORMAL HIGH (ref 3.4–10.8)

## 2021-11-13 LAB — CMP14+EGFR
ALT: 20 IU/L (ref 0–32)
AST: 23 IU/L (ref 0–40)
Albumin/Globulin Ratio: 1.4 (ref 1.2–2.2)
Albumin: 4.2 g/dL (ref 3.7–4.7)
Alkaline Phosphatase: 66 IU/L (ref 44–121)
BUN/Creatinine Ratio: 25 (ref 12–28)
BUN: 25 mg/dL (ref 8–27)
Bilirubin Total: 0.3 mg/dL (ref 0.0–1.2)
CO2: 21 mmol/L (ref 20–29)
Calcium: 9.9 mg/dL (ref 8.7–10.3)
Chloride: 102 mmol/L (ref 96–106)
Creatinine, Ser: 1.02 mg/dL — ABNORMAL HIGH (ref 0.57–1.00)
Globulin, Total: 3.1 g/dL (ref 1.5–4.5)
Glucose: 124 mg/dL — ABNORMAL HIGH (ref 70–99)
Potassium: 4.3 mmol/L (ref 3.5–5.2)
Sodium: 139 mmol/L (ref 134–144)
Total Protein: 7.3 g/dL (ref 6.0–8.5)
eGFR: 55 mL/min/{1.73_m2} — ABNORMAL LOW (ref >59)

## 2021-11-13 LAB — TSH: TSH: 0.387 u[IU]/mL — ABNORMAL LOW (ref 0.450–4.500)

## 2021-11-14 ENCOUNTER — Other Ambulatory Visit: Payer: Self-pay | Admitting: Family

## 2021-11-14 MED ORDER — LEVOTHYROXINE SODIUM 100 MCG PO TABS: 100.0000 ug | ORAL_TABLET | Freq: Every day | ORAL | 2 refills | Status: DC

## 2021-11-24 ENCOUNTER — Other Ambulatory Visit: Payer: Self-pay | Admitting: Family

## 2021-11-24 DIAGNOSIS — E559 Vitamin D deficiency, unspecified: Secondary | ICD-10-CM

## 2021-11-25 DIAGNOSIS — I509 Heart failure, unspecified: Secondary | ICD-10-CM | POA: Diagnosis not present

## 2021-11-25 DIAGNOSIS — J8410 Pulmonary fibrosis, unspecified: Secondary | ICD-10-CM | POA: Diagnosis not present

## 2021-11-25 DIAGNOSIS — J449 Chronic obstructive pulmonary disease, unspecified: Secondary | ICD-10-CM | POA: Diagnosis not present

## 2021-12-01 ENCOUNTER — Other Ambulatory Visit: Payer: Self-pay | Admitting: Family

## 2021-12-01 DIAGNOSIS — K219 Gastro-esophageal reflux disease without esophagitis: Secondary | ICD-10-CM

## 2021-12-03 ENCOUNTER — Other Ambulatory Visit: Payer: Self-pay | Admitting: *Deleted

## 2021-12-03 NOTE — Patient Outreach (Signed)
  Care Coordination   12/03/2021  Name: Lorraine Leonard MRN: [id number] DOB: 02/18/1941   Care Coordination Outreach Attempts:  An unsuccessful telephone outreach was attempted today to offer the patient information about available care coordination services as a benefit of their health plan. HIPAA compliant message left on voicemail, providing contact information for CSW, encouraging patient to return CSW's call at her earliest convenience.  Follow Up Plan:  Additional outreach attempts will be made to offer the patient care coordination information and services.   Encounter Outcome:  No Answer.   Care Coordination Interventions Activated:  No.    Care Coordination Interventions:  No, not indicated.    Nat Christen, BSW, MSW, LCSW  Licensed Education officer, environmental Health System  Mailing [place] [address] [place], [place] [address] Physical Address-[address] [place], [place] [address] Toll Free Main # [phone] Fax # [phone] Cell # [phone] [email or web address]

## 2021-12-09 ENCOUNTER — Other Ambulatory Visit: Payer: Self-pay | Admitting: *Deleted

## 2021-12-09 NOTE — Patient Outreach (Signed)
  Care Coordination   12/09/2021  Name: TAELYN BROECKER MRN: [id number] DOB: Sep 11, 1940   Care Coordination Outreach Attempts:  A second unsuccessful outreach was attempted today to offer the patient with information about available care coordination services as a benefit of their health plan.   HIPAA compliant messages left on voicemail, providing contact information for CSW, encouraging patient to return CSW's call at her earliest convenience.  Follow Up Plan:  Additional outreach attempts will be made to offer the patient care coordination information and services.   Encounter Outcome:  No Answer.   Care Coordination Interventions Activated:  No.    Care Coordination Interventions:  No, not indicated.    Nat Christen, BSW, MSW, LCSW  Licensed Education officer, environmental Health System  Mailing [place] [address], [place], [place] [address] Physical Address[address], [place], [place] [address] Toll Free Main # [phone] Fax # [phone] Cell # [phone] [email or web address]

## 2021-12-12 ENCOUNTER — Other Ambulatory Visit: Payer: Self-pay | Admitting: Family

## 2021-12-12 DIAGNOSIS — J449 Chronic obstructive pulmonary disease, unspecified: Secondary | ICD-10-CM

## 2021-12-18 ENCOUNTER — Other Ambulatory Visit: Payer: Self-pay | Admitting: Family

## 2021-12-19 ENCOUNTER — Other Ambulatory Visit: Payer: Self-pay | Admitting: Family

## 2021-12-19 DIAGNOSIS — F419 Anxiety disorder, unspecified: Secondary | ICD-10-CM

## 2021-12-24 ENCOUNTER — Ambulatory Visit (INDEPENDENT_AMBULATORY_CARE_PROVIDER_SITE_OTHER): Payer: Medicare Other

## 2021-12-24 DIAGNOSIS — I4891 Unspecified atrial fibrillation: Secondary | ICD-10-CM | POA: Diagnosis not present

## 2021-12-25 ENCOUNTER — Other Ambulatory Visit: Payer: Self-pay | Admitting: Family

## 2021-12-25 DIAGNOSIS — E1142 Type 2 diabetes mellitus with diabetic polyneuropathy: Secondary | ICD-10-CM

## 2021-12-26 ENCOUNTER — Encounter: Payer: Self-pay | Admitting: Family

## 2021-12-26 ENCOUNTER — Ambulatory Visit (INDEPENDENT_AMBULATORY_CARE_PROVIDER_SITE_OTHER): Payer: Medicare Other | Admitting: Family

## 2021-12-26 DIAGNOSIS — J8410 Pulmonary fibrosis, unspecified: Secondary | ICD-10-CM | POA: Diagnosis not present

## 2021-12-26 DIAGNOSIS — R399 Unspecified symptoms and signs involving the genitourinary system: Secondary | ICD-10-CM | POA: Diagnosis not present

## 2021-12-26 DIAGNOSIS — I509 Heart failure, unspecified: Secondary | ICD-10-CM | POA: Diagnosis not present

## 2021-12-26 DIAGNOSIS — J449 Chronic obstructive pulmonary disease, unspecified: Secondary | ICD-10-CM | POA: Diagnosis not present

## 2021-12-26 LAB — URINALYSIS, COMPLETE
Bilirubin, UA: NEGATIVE
Nitrite, UA: POSITIVE — AB
Specific Gravity, UA: <1.005 — ABNORMAL LOW (ref 1.005–1.030)
Urobilinogen, Ur: 4 mg/dL — ABNORMAL HIGH (ref 0.2–1.0)
pH, UA: 5 (ref 5.0–7.5)

## 2021-12-26 LAB — MICROSCOPIC EXAMINATION
Epithelial Cells (non renal): NONE SEEN /hpf (ref 0–10)
RBC, Urine: >30 /hpf — AB (ref 0–2)
Renal Epithel, UA: NONE SEEN /hpf
WBC, UA: >30 /hpf — AB (ref 0–5)

## 2021-12-26 MED ORDER — CEPHALEXIN 500 MG PO CAPS: 500.0000 mg | ORAL_CAPSULE | Freq: Two times a day (BID) | ORAL | 0 refills | Status: DC

## 2021-12-26 NOTE — Progress Notes (Signed)
Virtual Visit  Note Due to COVID-19 pandemic this visit was conducted virtually. This visit type was conducted due to national recommendations for restrictions regarding the COVID-19 Pandemic (e.g. social distancing, sheltering in place) in an effort to limit this patient's exposure and mitigate transmission in our community. All issues noted in this document were discussed and addressed.  A physical exam was not performed with this format.  I connected with Lorraine Leonard on 12/26/21 at 9:50 AM by telephone and verified that I am speaking with the correct person using two identifiers. Lorraine Leonard is currently located at home and no one  is currently with her during visit. The provider, Evelina Dun, FNP is located in their office at time of visit.  I discussed the limitations, risks, security and privacy concerns of performing an evaluation and management service by telephone and the availability of in person appointments. I also discussed with the patient that there may be a patient responsible charge related to this service. The patient expressed understanding and agreed to proceed.  [name], childers are scheduled for a virtual visit with your provider today.    Just as we do with appointments in the office, we must obtain your consent to participate.  Your consent will be active for this visit and any virtual visit you may have with one of our providers in the next 365 days.    If you have a MyChart account, I can also send a copy of this consent to you electronically.  All virtual visits are billed to your insurance company just like a traditional visit in the office.  As this is a virtual visit, video technology does not allow for your provider to perform a traditional examination.  This may limit your provider's ability to fully assess your condition.  If your provider identifies any concerns that need to be evaluated in person or the need to arrange testing such as labs, EKG, etc, we will make  arrangements to do so.    Although advances in technology are sophisticated, we cannot ensure that it will always work on either your end or our end.  If the connection with a video visit is poor, we may have to switch to a telephone visit.  With either a video or telephone visit, we are not always able to ensure that we have a secure connection.   I need to obtain your verbal consent now.   Are you willing to proceed with your visit today?   Lorraine Leonard has provided verbal consent on 12/26/2021 for a virtual visit (video or telephone).   Evelina Dun, [place] 12/26/2021  9:52 AM    History and Present Illness:  PT calls the office today with dysuria that started two days ago. She took AZO an symptoms improved, but return.  Dysuria  This is a new problem. The current episode started in the past 7 days. The problem has been gradually worsening. The quality of the pain is described as burning. The pain is at a severity of 6/10. The pain is mild. There has been no fever. Associated symptoms include frequency, nausea and urgency. Pertinent negatives include no hematuria or vomiting. She has tried increased fluids for the symptoms. The treatment provided mild relief.     Review of Systems  Gastrointestinal:  Positive for nausea. Negative for vomiting.  Genitourinary:  Positive for dysuria, frequency and urgency. Negative for hematuria.     Observations/Objective: No SOB or distress noted   Assessment and  Plan: 1. UTI symptoms Force fluids AZO over the counter X2 days RTO if symptoms worsen or do not improve  Culture pending - Urinalysis, Complete - cephALEXin (KEFLEX) 500 MG capsule; Take 1 capsule (500 mg total) by mouth 2 (two) times daily.  Dispense: 14 capsule; Refill: 0 - Urine Culture     I discussed the assessment and treatment plan with the patient. The patient was provided an opportunity to ask questions and all were answered. The patient agreed with the plan and demonstrated  an understanding of the instructions.   The patient was advised to call back or seek an in-person evaluation if the symptoms worsen or if the condition fails to improve as anticipated.  The above assessment and management plan was discussed with the patient. The patient verbalized understanding of and has agreed to the management plan. Patient is aware to call the clinic if symptoms persist or worsen. Patient is aware when to return to the clinic for a follow-up visit. Patient educated on when it is appropriate to go to the emergency department.   Time call ended:  10:02 AM   I provided 12 minutes of  non face-to-face time during this encounter.    Evelina Dun, FNP

## 2021-12-27 LAB — CUP PACEART REMOTE DEVICE CHECK
Battery Remaining Longevity: 34 mo
Battery Voltage: 2.86 V
Brady Statistic AP VP Percent: 1.05 %
Brady Statistic AP VS Percent: 81.53 %
Brady Statistic AS VP Percent: 0 %
Brady Statistic AS VS Percent: 17.41 %
Brady Statistic RA Percent Paced: 98.62 %
Brady Statistic RV Percent Paced: 1.06 %
Date Time Interrogation Session: 20230904234650
Implantable Lead Implant Date: 20190128
Implantable Lead Implant Date: 20190128
Implantable Lead Location: 753860
Implantable Lead Location: 753860
Implantable Lead Model: 3830
Implantable Lead Model: 5076
Implantable Pulse Generator Implant Date: 20190128
Lead Channel Impedance Value: 304 Ohm
Lead Channel Impedance Value: 342 Ohm
Lead Channel Impedance Value: 380 Ohm
Lead Channel Impedance Value: 380 Ohm
Lead Channel Sensing Intrinsic Amplitude: 3 mV
Lead Channel Sensing Intrinsic Amplitude: 3 mV
Lead Channel Sensing Intrinsic Amplitude: 9.5 mV
Lead Channel Sensing Intrinsic Amplitude: 9.5 mV
Lead Channel Setting Pacing Amplitude: 2.5 V
Lead Channel Setting Pacing Amplitude: 3.5 V
Lead Channel Setting Pacing Pulse Width: 0.4 ms
Lead Channel Setting Sensing Sensitivity: 0.9 mV

## 2021-12-28 LAB — URINE CULTURE

## 2021-12-30 ENCOUNTER — Encounter: Payer: Self-pay | Admitting: *Deleted

## 2021-12-30 ENCOUNTER — Ambulatory Visit: Payer: Self-pay | Admitting: *Deleted

## 2021-12-30 NOTE — Patient Outreach (Signed)
  Care Coordination   Initial Visit Note   12/30/2021  Name: KAYLIANA CODD MRN: [id number] DOB: 1941-01-20  Lorraine Leonard is a 81 y.o. year old female who sees Hawks, [name], FNP for primary care. I spoke with Lorraine Leonard by phone today.  What matters to the patients health and wellness today?  No Interventions Indicated.   Goals Addressed   None     SDOH assessments and interventions completed:  Yes.  SDOH Interventions Today    Flowsheet Row Most Recent Value  SDOH Interventions   Food Insecurity Interventions Intervention Not Indicated  Housing Interventions Intervention Not Indicated  Transportation Interventions Intervention Not Indicated  Utilities Interventions Intervention Not Indicated  Alcohol Usage Interventions Intervention Not Indicated (Score <7)  Financial Strain Interventions Intervention Not Indicated  Physical Activity Interventions Patient Refused  Stress Interventions Intervention Not Indicated  Social Connections Interventions Patient Refused        Care Coordination Interventions Activated:  Yes.   Care Coordination Interventions:  Yes, provided.   Follow up plan: No further intervention required.   Encounter Outcome:  Pt. Visit Completed.   Nat Christen, BSW, MSW, LCSW  Licensed Education officer, environmental Health System  Mailing [place] [address], [place], [place] [address] Physical Address-[address] [place],  [address] Toll Free Main # [phone] Fax # [phone] Cell # [phone] [email or web address]

## 2021-12-30 NOTE — Patient Instructions (Signed)
Visit Information  Thank you for taking time to visit with me today. Please don't hesitate to contact me if I can be of assistance to you.   Please call the care guide team at [phone] if you need to cancel or reschedule your appointment.   If you are experiencing a Mental Health or Behavioral Health Crisis or need someone to talk to, please call the Suicide and Crisis Lifeline: 988 call the [place] National Suicide Prevention Lifeline: 1-800-273-8255 or TTY: 1-800-799-4 TTY (1-800-799-4889) to talk to a trained counselor call 1-800-273-TALK (toll free, 24 hour hotline) go to [facility] [address], [place] ([phone]) call the [place] Crisis Line: 800-939-9988 call 911  Patient verbalizes understanding of instructions and care plan provided today and agrees to view in MyChart. Active MyChart status and patient understanding of how to access instructions and care plan via MyChart confirmed with patient.     No further follow up required.  Deepa Barthel, BSW, MSW, LCSW  Licensed Clinical Social Worker  [facility]  Mailing Address-[address], [place], [place] [address] Physical Address-[address], [place], [place] [address] Toll Free Main # [phone] Fax # [phone] Cell # [phone] [email or web address]            

## 2022-01-09 ENCOUNTER — Other Ambulatory Visit: Payer: Self-pay | Admitting: Family

## 2022-01-09 DIAGNOSIS — J019 Acute sinusitis, unspecified: Secondary | ICD-10-CM

## 2022-01-13 NOTE — Progress Notes (Signed)
Remote pacemaker transmission.   

## 2022-01-21 ENCOUNTER — Ambulatory Visit: Payer: Medicare Other | Attending: Internal Medicine | Admitting: Internal Medicine

## 2022-01-21 ENCOUNTER — Encounter: Payer: Self-pay | Admitting: Internal Medicine

## 2022-01-21 VITALS — BP 138/82 | HR 80 | Ht 67.0 in | Wt 206.0 lb

## 2022-01-21 DIAGNOSIS — I4819 Other persistent atrial fibrillation: Secondary | ICD-10-CM

## 2022-01-21 NOTE — Patient Instructions (Signed)
Medication Instructions:  Your physician recommends that you continue on your current medications as directed. Please refer to the Current Medication list given to you today.  *If you need a refill on your cardiac medications before your next appointment, please call your pharmacy*   Lab Work: NONE   If you have labs (blood work) drawn today and your tests are completely normal, you will receive your results only by: MyChart Message (if you have MyChart) OR A paper copy in the mail If you have any lab test that is abnormal or we need to change your treatment, we will call you to review the results.   Testing/Procedures: NONE    Follow-Up: At [facility], you and your health needs are our priority.  As part of our continuing mission to provide you with exceptional heart care, we have created designated Provider Care Teams.  These Care Teams include your primary Cardiologist (physician) and Advanced Practice Providers (APPs -  Physician Assistants and Nurse Practitioners) who all work together to provide you with the care you need, when you need it.  We recommend signing up for the patient portal called "MyChart".  Sign up information is provided on this After Visit Summary.  MyChart is used to connect with patients for Virtual Visits (Telemedicine).  Patients are able to view lab/test results, encounter notes, upcoming appointments, etc.  Non-urgent messages can be sent to your provider as well.   To learn more about what you can do with MyChart, go to [email or web address].    Your next appointment:   1 year(s)  The format for your next appointment:   In Person  Provider:   Gregg Taylor, MD    Other Instructions Thank you for choosing [facility]!    Important Information About Sugar       

## 2022-01-21 NOTE — Progress Notes (Signed)
HPI Lorraine Leonard returns today for followup. She is a chronically ill 81 yo woman with a h/o chronic diastolic heart failure, uncontrolled atrial fib s/p AV node ablation and PPM insertion. When we saw her last she was noted to be PM dependent and we turned her HR up to 70 from 50/min. She notes that she has had gradually improved. Her fatigue and dyspnea remain but have gotten better. She is sedentary but not quite as much as she was. She has not lost any more weight. She notes that she feels ok at rest but she feels some palpitations with exertion.  Allergies  Allergen Reactions   Allopurinol Swelling    Feet and legs swell   Mobic [Meloxicam] Swelling and Other (See Comments)    Feet and Leg swelling   Macrodantin [Nitrofurantoin Macrocrystal]      Current Outpatient Medications  Medication Sig Dispense Refill   albuterol (VENTOLIN HFA) 108 (90 Base) MCG/ACT inhaler INHALE 2 PUFFS INTO THE LUNGS EVERY 6 HOURS AS NEEDED FOR WHEEZE OR SHORTNESS OF BREATH 8.5 each 1   atenolol (TENORMIN) 25 MG tablet TAKE 1 TABLET (25 MG TOTAL) BY MOUTH DAILY. 90 tablet 1   calcium citrate (CALCITRATE - DOSED IN MG ELEMENTAL CALCIUM) 950 MG tablet Take 1 tablet by mouth 2 (two) times daily.      cyanocobalamin 1000 MCG tablet Take 1,000 mcg by mouth daily. Vitamin b12 every AM     diclofenac Sodium (VOLTAREN) 1 % GEL APPLY 4 GRAMS TOPICALLY 4 TIMES A DAY 400 g 1   ELIQUIS 5 MG TABS tablet TAKE 1 TABLET BY MOUTH TWICE A DAY 180 tablet 0   fluticasone (FLONASE) 50 MCG/ACT nasal spray PLACE 1 SPRAY INTO BOTH NOSTRILS 2 (TWO) TIMES DAILY AS NEEDED FOR ALLERGIES OR RHINITIS. 48 mL 1   furosemide (LASIX) 40 MG tablet TAKE 1 TABLET BY MOUTH EVERY DAY 90 tablet 0   levocetirizine (XYZAL) 5 MG tablet TAKE 1 TABLET BY MOUTH EVERY DAY IN THE EVENING 90 tablet 3   levothyroxine (SYNTHROID) 100 MCG tablet Take 1 tablet (100 mcg total) by mouth daily. 90 tablet 2   metFORMIN (GLUCOPHAGE-XR) 500 MG 24 hr tablet  TAKE 1 TABLET BY MOUTH EVERY DAY WITH BREAKFAST 90 tablet 0   Multiple Vitamins-Minerals (PRESERVISION AREDS 2) CAPS Take 1 capsule by mouth 2 (two) times daily.     Olopatadine HCl 0.2 % SOLN INSTILL 1 DROP INTO BOTH EYES EVERY DAY 10 mL 5   omeprazole (PRILOSEC) 20 MG capsule TAKE 1 CAPSULE BY MOUTH EVERY DAY 90 capsule 0   ondansetron (ZOFRAN ODT) 4 MG disintegrating tablet Take 1 tablet (4 mg total) by mouth every 8 (eight) hours as needed for nausea or vomiting. 20 tablet 0   OXYGEN Inhale 3 L into the lungs every evening.     Polyethyl Glycol-Propyl Glycol (SYSTANE) 0.4-0.3 % GEL ophthalmic gel Place 1 application into both eyes at bedtime as needed (dry eyes).     predniSONE (DELTASONE) 5 MG tablet Take 1 tablet (5 mg total) by mouth 2 (two) times daily with a meal. 180 tablet 2   rosuvastatin (CRESTOR) 10 MG tablet TAKE 1 TABLET BY MOUTH EVERYDAY AT BEDTIME 90 tablet 0   spironolactone (ALDACTONE) 25 MG tablet TAKE 1 TABLET (25 MG TOTAL) BY MOUTH DAILY. 90 tablet 0   vitamin C (ASCORBIC ACID) 500 MG tablet Take 1,000 mg by mouth daily. AM     Vitamin D, Ergocalciferol, (  DRISDOL) 1.25 MG (50000 UNIT) CAPS capsule TAKE 1 CAPSULE (50,000 UNITS TOTAL) BY MOUTH EVERY 7 (SEVEN) DAYS 12 capsule 1   No current facility-administered medications for this visit.     Past Medical History:  Diagnosis Date   Acute diastolic CHF (congestive heart failure) (HCC) 11/11/2016   Allergic rhinitis    Anxiety    Anxiety    Arthritis    "~ all my joints" (05/18/2017)   Atrial fibrillation ([place]) 09/2016   Atrial fibrillation with RVR ([place]) 05/18/2017   Chronic lower back pain    COPD (chronic obstructive pulmonary disease) (HCC)    Depression    Diverticulosis    Dyspnea    Dysrhythmia    GERD (gastroesophageal reflux disease)    Glaucoma    Gout    Heart disease    History of blood transfusion 1983   "when I gave a 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"   Hyperlipidemia    Hypertension    Hypothyroidism     Macular degeneration of both eyes    Neuropathy    On home oxygen therapy    "3L; 24/7" (05/18/2017)   Pre-diabetes    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   Pulmonary fibrosis (HCC)    Pulmonary fibrosis (HCC)    Rheumatoid arthritis (HCC)    Sleep apnea    uses 3 liters of o2 24/7   Urticaria     ROS:   All systems reviewed and negative except as noted in the HPI.   Past Surgical History:  Procedure Laterality Date   AV NODE ABLATION  05/18/2017   AV NODE ABLATION N/A 05/18/2017   Procedure: AV NODE ABLATION;  Surgeon: Evans Lance, MD;  Location: [facility];  Service: Cardiovascular;  Laterality: N/A;   BACK SURGERY     BREAST LUMPECTOMY Left    CARDIOVERSION N/A 01/30/2017   Procedure: CARDIOVERSION;  Surgeon: Josue Hector, MD;  [facility];  Service: Cardiovascular;  Laterality: N/A;   CARDIOVERSION N/A 04/09/2017   Procedure: CARDIOVERSION;  Surgeon: Satira Sark, MD;  [facility];  Service: Cardiovascular;  Laterality: N/A;   CATARACT EXTRACTION W/ INTRAOCULAR LENS  IMPLANT, BILATERAL Bilateral    DILATION AND CURETTAGE OF UTERUS     EUS N/A 10/03/2015   Procedure: UPPER ENDOSCOPIC ULTRASOUND (EUS) RADIAL;  Surgeon: Arta Silence, MD;  Location: [facility];  Service: Endoscopy;  Laterality: N/A;   FINE NEEDLE ASPIRATION N/A 10/03/2015   Procedure: FINE NEEDLE ASPIRATION (FNA) RADIAL;  Surgeon: Arta Silence, MD;  Location: [facility];  Service: Endoscopy;  Laterality: N/A;   INSERT / REPLACE / REMOVE PACEMAKER  05/18/2017   LUMBAR LAMINECTOMY  1995; 06/23/2008   L4-5/notes 08/20/2010   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   NEPHRECTOMY TRANSPLANTED ORGAN     PACEMAKER IMPLANT N/A 05/18/2017   Procedure: PACEMAKER IMPLANT;  Surgeon: Evans Lance, MD;  Location: [facility];  Service: Cardiovascular;  Laterality: N/A;   TOTAL ABDOMINAL HYSTERECTOMY     TUBAL LIGATION        Family History  Problem Relation Age of Onset   Diabetes Other    Hypertension Other    Coronary artery disease Other    Stroke Other    Asthma Other    Kidney disease Sister    Cancer Sister        lung   Lung cancer Sister    Cancer  Sister        lung   Lung cancer Sister    Cancer Sister        lung   Suicidality Son        committed suicide in 2009   CVA Son    Hyperlipidemia Other    Anxiety disorder Other    Migraines Other    Heart failure Father    Healthy Brother    Cancer Sister        lung   Healthy Sister      Social History   Socioeconomic History   Marital status: Widowed    Spouse name: Jacob Chamblee   Number of children: 4   Years of education: 9   Highest education level: 9th grade  Occupational History   Occupation: retired    Comment: cna  Tobacco Use   Smoking status: Never    Passive exposure: Never   Smokeless tobacco: Never  Vaping Use   Vaping Use: Never used  Substance and Sexual Activity   Alcohol use: No   Drug use: No   Sexual activity: Not Currently    Partners: Male    Birth control/protection: Surgical  Other Topics Concern   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   Pt lives alone in single wide trailer. They have 4 children and 13 grandchildren.    Social Determinants of Health   Financial Resource Strain: Low Risk  (12/30/2021)   Overall Financial Resource Strain (CARDIA)    Difficulty of Paying Living Expenses: Not hard at all  Food Insecurity: No Food Insecurity (12/30/2021)   Hunger Vital Sign    Worried About Running Out of Food in the Last Year: Never true    Ran Out of Food in the Last Year: Never true  Transportation Needs: No Transportation Needs (12/30/2021)   PRAPARE - Hydrologist (Medical): No    Lack of Transportation (Non-Medical): No  Physical Activity: Inactive (12/30/2021)   Exercise Vital Sign     Days of Exercise per Week: 0 days    Minutes of Exercise per Session: 0 min  Stress: No Stress Concern Present (12/30/2021)   [place]    Feeling of Stress : Only a little  Social Connections: Moderately Isolated (12/30/2021)   Social Connection and Isolation Panel [NHANES]    Frequency of Communication with Friends and Family: More than three times a week    Frequency of Social Gatherings with Friends and Family: More than three times a week    Attends Religious Services: 1 to 4 times per year    Active Member of [employer] or Organizations: No    Attends Archivist Meetings: Never    Marital Status: Widowed  Intimate Partner Violence: Not At Risk (12/30/2021)   Humiliation, Afraid, Rape, and Kick questionnaire    Fear of Current or Ex-Partner: No    Emotionally Abused: No    Physically Abused: No    Sexually Abused: No     BP 138/82   Pulse 80   Ht '5\' 7"'$  (1.702 m)   Wt 206 lb (93.4 kg)   SpO2 97%   BMI 32.26 kg/m   Physical Exam:  Well appearing NAD HEENT: Unremarkable Neck:  No JVD, no thyromegally Lymphatics:  No adenopathy Back:  No CVA tenderness Lungs:  Clear HEART:  Regular rate rhythm, no murmurs, no rubs, no clicks Abd:  soft, positive bowel sounds, no organomegally, no rebound, no guarding Ext:  2 plus pulses, no edema, no cyanosis, no clubbing Skin:  No rashes no nodules Neuro:  CN II through XII intact, motor grossly intact  EKG- atrial fib with intermittent ventricular pacing  DEVICE  Normal device function.  See PaceArt for details.   Assess/Plan:   Atrial fib - her VR is well controlled. She has an increased escape rhythm today but denies palpitations. Obesity - her weight is unchanged.  I encouraged her to get under 200. HTN - her bp is up a bit today. We will follow. 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 The His bundle lead in the atrial port has a chronically  elevated threshold. But is stable. We will follow.   Carleene Overlie Sonny Anthes,MD

## 2022-01-22 ENCOUNTER — Other Ambulatory Visit: Payer: Self-pay | Admitting: Family

## 2022-01-22 DIAGNOSIS — F419 Anxiety disorder, unspecified: Secondary | ICD-10-CM

## 2022-01-25 DIAGNOSIS — J8410 Pulmonary fibrosis, unspecified: Secondary | ICD-10-CM | POA: Diagnosis not present

## 2022-01-25 DIAGNOSIS — I509 Heart failure, unspecified: Secondary | ICD-10-CM | POA: Diagnosis not present

## 2022-01-25 DIAGNOSIS — J449 Chronic obstructive pulmonary disease, unspecified: Secondary | ICD-10-CM | POA: Diagnosis not present

## 2022-01-29 ENCOUNTER — Encounter: Payer: Self-pay | Admitting: Internal Medicine

## 2022-01-29 ENCOUNTER — Ambulatory Visit (INDEPENDENT_AMBULATORY_CARE_PROVIDER_SITE_OTHER): Payer: Medicare Other | Admitting: Internal Medicine

## 2022-01-29 VITALS — BP 130/68 | HR 90 | Temp 97.6°F | Ht 67.0 in | Wt 206.8 lb

## 2022-01-29 DIAGNOSIS — J8410 Pulmonary fibrosis, unspecified: Secondary | ICD-10-CM

## 2022-01-29 DIAGNOSIS — Z23 Encounter for immunization: Secondary | ICD-10-CM

## 2022-01-29 NOTE — Progress Notes (Signed)
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  She may also have obstructive sleep apnea but this is not been confirmed as she has not wanted a polysomnogram.  It appears that she is not been on maintenance prednisone therapy since August 2021.  She was seen here in a phone visit earlier this month and treated with a prednisone taper for possible flare in either her COPD, ILD or both.  She was continued on Incruse and supplemental oxygen at 3 L/min by nasal cannula.    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 She reports that the pred taper helped her breathing - now feeling back to baseline. She is following w Dr Trudie Reed, started her on prednisone '10mg'$  which she is now taking daily. She is on Incruse daily. She has albuterol available is using 0-1x a day. She remains on flonase, xyzal. Good compliance w her O2.   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 She is going to communicate w Dr Trudie Reed and continue if she agrees Continue flonase and xyzal.   History of Present Illness  03/19/2020   Pulmonary/ 1st office eval/Lorraine Leonard  Prednisone ? 5 mg daily (was on incruse but off x months) vaccine resistant Chief Complaint  Patient presents with   Follow-up    Patient states that she has had some wheezing for the last 4-5 days and coughing more. Wears 3 liters oxygen all the time.    Dyspnea:  Mostly housebound / very sedentary  Cough: more x one week  Sleep: bed is flat one pillow  SABA use: hfa has not used / rarely needed more than twice a week  Has not used in months  02 : runs in high 90s s 02 at rest but not checking with activity Sleeps on 3lpm   Rec Plan A = Automatic = Always=    BREO  200 one each am (you take as many drags as want but only one click)   - if better over the next 2 weeks then fill the BREO 778 one click each am.  Plan B = Backup (to supplement plan A, not to replace it) Only use your albuterol inhaler as a rescue medication Plan C = Crisis (instead of Plan B but only if Plan B stops working) - only use your albuterol nebulizer if you first try Plan B and it fails to help > ok to use the nebulizer up to every 4 hours but if start needing it regularly call for immediate appointment Plan D = Deltasone = Doubling  - deltasone is prednisone so double it if needing place C (the nebulizer)  much at  all  Make sure you check your oxygen saturation at highest level of activity to be sure it stays over 90%    01/09/2021  f/u ov/[place] office/Lorraine Leonard re: RA ILD/ AB / 02 dep maint on prednisone 5 mg x 2-3 weeks bid due to itching/ nasal drainage / no maint inhalers Chief Complaint  Patient presents with   Follow-up    SOB and cough are about the same as last visit. Sometimes coughs up clear/ yellow mucus. Only uses oxygen 3L O2 when needed at home.    Dyspnea:  mostly housebound / gets let off at door when goes anywhere / uses rollator at home  Cough: since last visit worse as day goes on / minimally productive mucoid  Sleeping: bed flat/ one pillow  SABA use: rarely  hfa/ almost never neb/ on prednisone 5 mg daily  02: 3lpm  Covid status: never vaxed, never infected  Rec Plan A  = automatic = Prednisone 5 mg each with breakfast   Plan B = Backup (to supplement plan A, not to replace it) Only use your albuterol inhaler as a rescue medication  Plan C = Crisis (instead of Plan B but only if Plan B stops working) - only use your albuterol nebulizer if you first try Plan B  Plan D =  Double the prednisone  - double it if needing place C (the nebulizer)  much at all or worse breathing or coughing or wheezing or arthritis and once better for a week drop back to 5 mg daily  Make sure you check your oxygen saturation at highest level of activity to be sure it stays over 90%        08/07/2021  f/u ov/[place] office/Lorraine Leonard re: RA/ILD  maint on prednisone 5  one twice daily  per [facility] Complaint  Patient presents with   Follow-up    Cough and sob have worsened. Having trouble being ambulatory.   Dyspnea:  room to room s 02 on rollator Cough: tickle daytime Sleeping: on side bed is flat  SABA use: not much  02: prn  Covid status: vax none  Was on abx x years for UTI ,  then switched to a pill (? Ongoing macrodantin exp)  Rec We will find out from [facility] the last time you took macrodantin and list an allergy  Make sure you check your oxygen saturation  AT  your highest level of activity (not after you stop)   to be sure it stays over 90%      01/29/2022  f/u ov/[place] office/Lorraine Leonard re: RA/ ILD  maint on prednisone 5 mg twice dialy  Chief Complaint  Patient presents with   Follow-up    Breathing doing well. Has nose bleeds occ. From left nostril. Last nose bleed was today.   Dyspnea:  room to room and "02 is fine everytime I check"  it but not clear doing while walking Cough: throat tickle dry  Sleeping: no resp cc /flat bed plump pillow  SABA use: not using  02: hardly ever uses  Covid status: vax never      No obvious day to day or  daytime variability or assoc excess/ purulent sputum or mucus plugs or hemoptysis or cp or chest tightness, subjective wheeze or overt sinus or hb symptoms.   sleeping without nocturnal  or early am exacerbation  of respiratory  c/o's or need for noct saba. Also denies any obvious fluctuation of symptoms with weather or environmental changes or other  aggravating or alleviating factors except as outlined above   No unusual exposure hx or h/o childhood pna/ asthma or knowledge of premature birth.  Current Allergies, Complete Past Medical History, Past Surgical History, Family History, and Social History were reviewed in [employer] record.  ROS  The following are not active complaints unless bolded Hoarseness, sore throat, dysphagia, dental problems, itching, sneezing,  nasal congestion or discharge of excess mucus or purulent secretions, ear ache,   fever, chills, sweats, unintended wt loss or wt gain, classically pleuritic or exertional cp,  orthopnea pnd or arm/hand swelling  or leg swelling, presyncope, palpitations, abdominal pain, anorexia, nausea, vomiting, diarrhea  or change in bowel habits or change in bladder habits, change in stools or change in urine, dysuria, hematuria,  rash, arthralgias, visual complaints, headache, numbness, weakness or ataxia or problems with walking or coordination,  change in mood or  memory.        Current Meds  Medication Sig   albuterol (VENTOLIN HFA) 108 (90 Base) MCG/ACT inhaler INHALE 2 PUFFS INTO THE LUNGS EVERY 6 HOURS AS NEEDED FOR WHEEZE OR SHORTNESS OF BREATH   atenolol (TENORMIN) 25 MG tablet TAKE 1 TABLET (25 MG TOTAL) BY MOUTH DAILY.   calcium citrate (CALCITRATE - DOSED IN MG ELEMENTAL CALCIUM) 950 MG tablet Take 1 tablet by mouth 2 (two) times daily.    cyanocobalamin 1000 MCG tablet Take 1,000 mcg by mouth daily. Vitamin b12 every AM   diclofenac Sodium (VOLTAREN) 1 % GEL APPLY 4 GRAMS TOPICALLY 4 TIMES A DAY   ELIQUIS 5 MG  TABS tablet TAKE 1 TABLET BY MOUTH TWICE A DAY   fluticasone (FLONASE) 50 MCG/ACT nasal spray PLACE 1 SPRAY INTO BOTH NOSTRILS 2 (TWO) TIMES DAILY AS NEEDED FOR ALLERGIES OR RHINITIS.   furosemide (LASIX) 40 MG tablet TAKE 1 TABLET BY MOUTH EVERY DAY   levocetirizine (XYZAL) 5 MG tablet TAKE 1 TABLET BY MOUTH EVERY DAY IN THE EVENING   levothyroxine (SYNTHROID) 100 MCG tablet Take 1 tablet (100 mcg total) by mouth daily.   metFORMIN (GLUCOPHAGE-XR) 500 MG 24 hr tablet TAKE 1 TABLET BY MOUTH EVERY DAY WITH BREAKFAST   Multiple Vitamins-Minerals (PRESERVISION AREDS 2) CAPS Take 1 capsule by mouth 2 (two) times daily.   Olopatadine HCl 0.2 % SOLN INSTILL 1 DROP INTO BOTH EYES EVERY DAY   omeprazole (PRILOSEC) 20 MG capsule TAKE 1 CAPSULE BY MOUTH EVERY DAY   ondansetron (ZOFRAN ODT) 4 MG disintegrating tablet Take 1 tablet (4 mg total) by mouth every 8 (eight) hours as needed for nausea or vomiting.   OXYGEN Inhale 3 L into the lungs every evening.   Polyethyl Glycol-Propyl Glycol (SYSTANE) 0.4-0.3 % GEL ophthalmic gel Place 1 application into both eyes at bedtime as needed (dry eyes).   predniSONE (DELTASONE) 5 MG tablet Take 1 tablet (5 mg total) by mouth 2 (two) times daily with a meal.   rosuvastatin (CRESTOR) 10 MG tablet TAKE 1 TABLET BY MOUTH EVERYDAY AT BEDTIME   spironolactone (ALDACTONE) 25 MG tablet TAKE 1 TABLET (25 MG TOTAL) BY MOUTH DAILY.   vitamin C (ASCORBIC ACID) 500 MG tablet Take 1,000 mg by mouth daily. AM   Vitamin D, Ergocalciferol, (DRISDOL) 1.25 MG (50000 UNIT) CAPS capsule TAKE 1 CAPSULE (50,000 UNITS TOTAL) BY MOUTH EVERY 7 (SEVEN) DAYS                    Past Medical History:  Diagnosis Date   Acute  diastolic CHF (congestive heart failure) ([place]) 11/11/2016   Allergic rhinitis    Anxiety    Anxiety    Arthritis    "~ all my joints" (05/18/2017)   Atrial fibrillation ([place]) 09/2016   Atrial fibrillation with RVR ([place]) 05/18/2017   Chronic lower back pain     COPD (chronic obstructive pulmonary disease) (HCC)    Depression    Diverticulosis    Dyspnea    Dysrhythmia    GERD (gastroesophageal reflux disease)    Glaucoma    Gout    Heart disease    History of blood transfusion 1983   "when I gave a 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"   Hyperlipidemia    Hypertension    Hypothyroidism    Macular degeneration of both eyes    Neuropathy    On home oxygen therapy    "3L; 24/7" (05/18/2017)   Pre-diabetes    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   Pulmonary fibrosis (HCC)    Pulmonary fibrosis (HCC)    Rheumatoid arthritis (HCC)    Sleep apnea    uses 3 liters of o2 24/7   Urticaria        Objective:     Wts  01/29/2022      206   08/07/2021       209  01/09/21 197 lb 1.3 oz (89.4 kg)  01/08/21 206 lb (93.4 kg)  10/30/20 204 lb 9.6 oz (92.8 kg)      Vital signs reviewed  01/29/2022  - Note at rest 02 sats  95% on RA   General appearance:    w/c bound elderly wf nad        HEENT : Oropharynx  clear         NECK :  without  apparent JVD/ palpable Nodes/TM    LUNGS: no acc muscle use,  slt kyphotic contour chest with minimal insp crackles in bases bilaterally without cough on insp or exp maneuvers   CV:  RRR  no s3 or murmur or increase in P2, and no edema   ABD:  soft and nontender   MS:    ext warm s  calf tenderness, cyanosis or clubbing    SKIN: warm and dry without lesions    NEURO:  alert, approp, nl sensorium with  no motor or cerebellar deficits apparent.      Assessment

## 2022-01-29 NOTE — Assessment & Plan Note (Signed)
pfts s obst 04/05/17  and pt never smoked so this is not copd  -03/19/2020  After extensive coaching inhaler device,  effectiveness =    75% with elipta  So try breo 200 for mild flare than maintain on breo 100 to prevent future flares > did not continue, no change in symptoms  Adequate control on present rx, reviewed in detail with pt > no change in rx needed    No changes needed         Each maintenance medication was reviewed in detail including emphasizing most importantly the difference between maintenance and prns and under what circumstances the prns are to be triggered using an action plan format where appropriate.  Total time for H and P, chart review, counseling, reviewing dpi device(s) , directly observing portions of ambulatory 02 saturation study/ and generating customized AVS unique to this office visit / same day charting = 25 min

## 2022-01-29 NOTE — Assessment & Plan Note (Signed)
Onset ? Year PFT's  03/26/17    FVC  1.96 (62 % ) s obstruction DLCO  15.01 (53%) corrects to Chest HRCT  05/09/19  Pulmonary parenchymal pattern of fibrosis appears grossly stable from 03/23/2018 and may be due to usual interstitial pneumonitis or nonspecific interstitial pneumonitis. Findings are indeterminate for UIP per consensus guidelines  4.22 (81%)  for alv volume and FV curve minimally concave and ERV 17%  -  03/19/2020   Walked RA  approx   100 ft  @ very slow pace  stopped due to  Legs gave out first, sats 94%   - 01/09/2021   Walked on RA x  2  lap(s) =  approx 300 ft @ slow/rollator pace, stopped due to legs gave out/ mild sob  with lowest 02 sats 95%   - 08/07/2021   Walked on RA  x  approx 100  ft  @ slow pace, stopped due to sob with lowest 02 sats 91%   Rec: Need records on macrodantin rx > requested   Advised Make sure you check your oxygen saturation at your highest level of activity to be sure it stays over 90% and keep track of it at least once a week, more often if breathing getting worse, and let me know if losing ground.

## 2022-01-29 NOTE — Patient Instructions (Addendum)
.  Make sure you check your oxygen saturation at your highest level of activity to be sure it stays over 90% and keep track of it at least once a week, more often if breathing getting worse, and let me know if losing ground. -  just check once a week.   Pulmonary follow up is as needed

## 2022-01-31 ENCOUNTER — Encounter: Payer: Self-pay | Admitting: Internal Medicine

## 2022-01-31 NOTE — Assessment & Plan Note (Signed)
Onset ? Year PFT's  03/26/17    FVC  1.96 (62 % ) s obstruction DLCO  15.01 (53%) corrects to Chest HRCT  05/09/19  Pulmonary parenchymal pattern of fibrosis appears grossly stable from 03/23/2018 and may be due to usual interstitial pneumonitis or nonspecific interstitial pneumonitis. Findings are indeterminate for UIP per consensus guidelines  4.22 (81%)  for alv volume and FV curve minimally concave and ERV 17%  -  03/19/2020   Walked RA  approx   100 ft  @ very slow pace  stopped due to  Legs gave out first, sats 94%   - 01/09/2021   Walked on RA x  2  lap(s) =  approx 300 ft @ slow/rollator pace, stopped due to legs gave out/ mild sob  with lowest 02 sats 95%   - 08/07/2021   Walked on RA  x  approx 100  ft  @ slow pace, stopped due to sob with lowest 02 sats 91%   No evidence of progression but hard to tell as she is so sedentary now but best way to monitor:  Serial sats at peak activity and call if losing ground.    F/u yearly, sooner prn

## 2022-02-03 ENCOUNTER — Other Ambulatory Visit: Payer: Self-pay | Admitting: Family

## 2022-02-03 DIAGNOSIS — J449 Chronic obstructive pulmonary disease, unspecified: Secondary | ICD-10-CM

## 2022-02-04 ENCOUNTER — Other Ambulatory Visit: Payer: Self-pay | Admitting: Family Medicine

## 2022-02-04 ENCOUNTER — Other Ambulatory Visit: Payer: Medicare Other

## 2022-02-04 ENCOUNTER — Encounter: Payer: Self-pay | Admitting: Family Medicine

## 2022-02-04 ENCOUNTER — Other Ambulatory Visit: Payer: Self-pay | Admitting: Family

## 2022-02-04 ENCOUNTER — Ambulatory Visit (INDEPENDENT_AMBULATORY_CARE_PROVIDER_SITE_OTHER): Payer: Medicare Other | Admitting: Family Medicine

## 2022-02-04 DIAGNOSIS — N3090 Cystitis, unspecified without hematuria: Secondary | ICD-10-CM | POA: Diagnosis not present

## 2022-02-04 DIAGNOSIS — R300 Dysuria: Secondary | ICD-10-CM | POA: Diagnosis not present

## 2022-02-04 LAB — URINALYSIS, COMPLETE
Bilirubin, UA: NEGATIVE
Glucose, UA: NEGATIVE
Ketones, UA: NEGATIVE
Nitrite, UA: POSITIVE — AB
Protein,UA: NEGATIVE
Specific Gravity, UA: 1.015 (ref 1.005–1.030)
Urobilinogen, Ur: 0.2 mg/dL (ref 0.2–1.0)
pH, UA: 6 (ref 5.0–7.5)

## 2022-02-04 LAB — MICROSCOPIC EXAMINATION: Renal Epithel, UA: NONE SEEN /hpf

## 2022-02-04 MED ORDER — AMOXICILLIN 500 MG PO CAPS: 500.0000 mg | ORAL_CAPSULE | Freq: Three times a day (TID) | ORAL | 0 refills | Status: DC

## 2022-02-04 NOTE — Progress Notes (Signed)
Subjective:    Patient ID: Lorraine Leonard, female    DOB: October 17, 1940, 81 y.o.   MRN: [id number]   HPI: Lorraine Leonard is a 81 y.o. female presenting for 3 days of sx off and on. Some diarrhea. Burning with urination. Some frequency. No fever. Appetite is good.       12/30/2021   12:38 PM 11/12/2021    1:51 PM 08/06/2021    2:08 PM 04/30/2021    3:15 PM 04/30/2021    2:28 PM  Depression screen PHQ 2/9  Decreased Interest 0 0 0 3 0  Down, Depressed, Hopeless '1 1 2 3 '$ 0  PHQ - 2 Score '1 1 2 6 '$ 0  Altered sleeping  '2 2 3   '$ Tired, decreased energy  '2 2 3   '$ Change in appetite  2 0 0   Feeling bad or failure about yourself   0 0 0   Trouble concentrating  1 0 1   Moving slowly or fidgety/restless  1 0 0   Suicidal thoughts  0 0 0   PHQ-9 Score  '9 6 13   '$ Difficult doing work/chores  Not difficult at all Somewhat difficult       Relevant past medical, surgical, family and social history reviewed and updated as indicated.  Interim medical history since our last visit reviewed. Allergies and medications reviewed and updated.  ROS:  Review of Systems  Constitutional:  Negative for chills, diaphoresis and fever.  HENT:  Negative for congestion.   Eyes:  Negative for visual disturbance.  Respiratory:  Negative for cough and shortness of breath.   Cardiovascular:  Negative for chest pain and palpitations.  Gastrointestinal:  Negative for constipation, diarrhea and nausea.  Genitourinary:  Positive for dysuria, frequency and urgency. Negative for decreased urine volume, flank pain, hematuria, menstrual problem and pelvic pain.  Musculoskeletal:  Negative for arthralgias and joint swelling.  Skin:  Negative for rash.  Neurological:  Negative for dizziness and numbness.     Social History   Tobacco Use  Smoking Status Never   Passive exposure: Never  Smokeless Tobacco Never       Objective:     Wt Readings from Last 3 Encounters:  01/29/22 206 lb 12.8 oz (93.8 kg)  01/21/22  206 lb (93.4 kg)  11/12/21 207 lb 9.6 oz (94.2 kg)     Exam deferred. Pt. Harboring due to COVID 19. Phone visit performed.   UA attached, reviewed  Assessment & Plan:   1. Cystitis     Meds ordered this encounter  Medications   amoxicillin (AMOXIL) 500 MG capsule    Sig: Take 1 capsule (500 mg total) by mouth 3 (three) times daily.    Dispense:  21 capsule    Refill:  0    Orders Placed This Encounter  Procedures   Urine Culture      Diagnoses and all orders for this visit:  Cystitis -     Urine Culture  Other orders -     amoxicillin (AMOXIL) 500 MG capsule; Take 1 capsule (500 mg total) by mouth 3 (three) times daily.    Virtual Visit via telephone Note  I discussed the limitations, risks, security and privacy concerns of performing an evaluation and management service by telephone and the availability of in person appointments. The patient was identified with two identifiers. Pt.expressed understanding and agreed to proceed. Pt. Is at home. Dr. Livia Snellen is in his office.  Follow Up Instructions:  I discussed the assessment and treatment plan with the patient. The patient was provided an opportunity to ask questions and all were answered. The patient agreed with the plan and demonstrated an understanding of the instructions.   The patient was advised to call back or seek an in-person evaluation if the symptoms worsen or if the condition fails to improve as anticipated.   Total minutes including chart review and phone contact time: 12   Follow up plan: Return if symptoms worsen or fail to improve.  Claretta Fraise, MD [place]

## 2022-02-07 LAB — URINE CULTURE

## 2022-02-10 ENCOUNTER — Other Ambulatory Visit: Payer: Self-pay | Admitting: Family

## 2022-02-12 ENCOUNTER — Other Ambulatory Visit: Payer: Self-pay | Admitting: Family

## 2022-02-12 DIAGNOSIS — K219 Gastro-esophageal reflux disease without esophagitis: Secondary | ICD-10-CM

## 2022-02-18 ENCOUNTER — Ambulatory Visit: Payer: Medicare Other | Admitting: Family

## 2022-02-25 ENCOUNTER — Encounter: Payer: Self-pay | Admitting: Family

## 2022-02-25 ENCOUNTER — Ambulatory Visit (INDEPENDENT_AMBULATORY_CARE_PROVIDER_SITE_OTHER): Payer: Medicare Other | Admitting: Family

## 2022-02-25 VITALS — BP 129/77 | HR 81 | Temp 97.8°F | Ht 67.0 in | Wt 206.6 lb

## 2022-02-25 DIAGNOSIS — J449 Chronic obstructive pulmonary disease, unspecified: Secondary | ICD-10-CM

## 2022-02-25 DIAGNOSIS — N183 Chronic kidney disease, stage 3 unspecified: Secondary | ICD-10-CM | POA: Diagnosis not present

## 2022-02-25 DIAGNOSIS — E039 Hypothyroidism, unspecified: Secondary | ICD-10-CM

## 2022-02-25 DIAGNOSIS — E1169 Type 2 diabetes mellitus with other specified complication: Secondary | ICD-10-CM

## 2022-02-25 DIAGNOSIS — F331 Major depressive disorder, recurrent, moderate: Secondary | ICD-10-CM

## 2022-02-25 DIAGNOSIS — I152 Hypertension secondary to endocrine disorders: Secondary | ICD-10-CM | POA: Diagnosis not present

## 2022-02-25 DIAGNOSIS — E669 Obesity, unspecified: Secondary | ICD-10-CM

## 2022-02-25 DIAGNOSIS — D500 Iron deficiency anemia secondary to blood loss (chronic): Secondary | ICD-10-CM | POA: Diagnosis not present

## 2022-02-25 DIAGNOSIS — E1159 Type 2 diabetes mellitus with other circulatory complications: Secondary | ICD-10-CM | POA: Diagnosis not present

## 2022-02-25 DIAGNOSIS — E1142 Type 2 diabetes mellitus with diabetic polyneuropathy: Secondary | ICD-10-CM

## 2022-02-25 DIAGNOSIS — M159 Polyosteoarthritis, unspecified: Secondary | ICD-10-CM | POA: Diagnosis not present

## 2022-02-25 DIAGNOSIS — E559 Vitamin D deficiency, unspecified: Secondary | ICD-10-CM

## 2022-02-25 DIAGNOSIS — F419 Anxiety disorder, unspecified: Secondary | ICD-10-CM | POA: Diagnosis not present

## 2022-02-25 DIAGNOSIS — K5901 Slow transit constipation: Secondary | ICD-10-CM | POA: Diagnosis not present

## 2022-02-25 DIAGNOSIS — R040 Epistaxis: Secondary | ICD-10-CM

## 2022-02-25 DIAGNOSIS — I5031 Acute diastolic (congestive) heart failure: Secondary | ICD-10-CM

## 2022-02-25 DIAGNOSIS — E785 Hyperlipidemia, unspecified: Secondary | ICD-10-CM

## 2022-02-25 DIAGNOSIS — I509 Heart failure, unspecified: Secondary | ICD-10-CM | POA: Diagnosis not present

## 2022-02-25 DIAGNOSIS — J8410 Pulmonary fibrosis, unspecified: Secondary | ICD-10-CM | POA: Diagnosis not present

## 2022-02-25 DIAGNOSIS — I4891 Unspecified atrial fibrillation: Secondary | ICD-10-CM

## 2022-02-25 LAB — BAYER DCA HB A1C WAIVED: HB A1C (BAYER DCA - WAIVED): 6.8 % — ABNORMAL HIGH (ref 4.8–5.6)

## 2022-02-25 MED ORDER — ESTRADIOL 0.1 MG/GM VA CREA: 1.0000 | TOPICAL_CREAM | Freq: Every day | VAGINAL | 12 refills | Status: DC

## 2022-02-25 NOTE — Progress Notes (Signed)
Subjective:    Patient ID: Lorraine Leonard, female    DOB: 1940-11-29, 81 y.o.   MRN: [id number]  Chief Complaint  Patient presents with   Medical Management of Chronic Issues    Nose bleeds, stopped magnesium and has not happen since. Cramps wants something for them. Swelling in ankles.    Pt presents to the office today for chronic follow up.    She is followed by Cardiologists every 6 months for CHF & A Fib. She is followed by Pulmonologist annually for pulmonary hypertension, COPD, and Pulmonary Fibrosis. She was told to decrease her oxygen to as needed. Pt is not on oxygen today.  She reports her breathing is stable, but SOB with exertion.    She is followed by Nephrologists as needed for CKD. Avoids NSAID's.  She is followed by Urologists for recurrent UTI's annually.  Hypertension This is a chronic problem. The current episode started more than 1 year ago. The problem has been resolved since onset. The problem is controlled. Associated symptoms include blurred vision, malaise/fatigue, peripheral edema (slightly in ankles) and shortness of breath (when walking). Risk factors for coronary artery disease include dyslipidemia, diabetes mellitus, obesity and sedentary lifestyle. The current treatment provides moderate improvement. Hypertensive end-organ damage includes heart failure. Identifiable causes of hypertension include a thyroid problem.  Congestive Heart Failure Presents for follow-up visit. Associated symptoms include edema, fatigue and shortness of breath (when walking). The symptoms have been stable.  Gastroesophageal Reflux She complains of belching and heartburn. This is a chronic problem. The current episode started more than 1 year ago. The problem occurs occasionally. Associated symptoms include fatigue. Risk factors include obesity. She has tried a PPI for the symptoms. The treatment provided moderate relief.  Hyperlipidemia This is a chronic problem. The current episode  started more than 1 year ago. The problem is controlled. Recent lipid tests were reviewed and are normal. Exacerbating diseases include obesity. Associated symptoms include shortness of breath (when walking). Current antihyperlipidemic treatment includes statins. The current treatment provides moderate improvement of lipids. Risk factors for coronary artery disease include dyslipidemia, diabetes mellitus, hypertension, post-menopausal and a sedentary lifestyle.  Thyroid Problem Presents for follow-up visit. Symptoms include depressed mood, diarrhea, dry skin and fatigue. Patient reports no anxiety or constipation. The symptoms have been stable. Her past medical history is significant for heart failure and hyperlipidemia.  Diabetes She presents for her follow-up diabetic visit. She has type 2 diabetes mellitus. There are no hypoglycemic associated symptoms. Pertinent negatives for hypoglycemia include no nervousness/anxiousness. Associated symptoms include blurred vision, fatigue and foot paresthesias. Symptoms are stable. Diabetic complications include heart disease and peripheral neuropathy. Risk factors for coronary artery disease include diabetes mellitus, dyslipidemia, sedentary lifestyle, hypertension and post-menopausal. (Does not check BS at home ) Eye exam is current.  Arthritis Presents for follow-up visit. She complains of pain and stiffness. Affected locations include the left knee and right knee (back). Her pain is at a severity of 2/10. Associated symptoms include diarrhea and fatigue.  Depression        This is a chronic problem.  The current episode started more than 1 year ago.   The problem occurs intermittently.  Associated symptoms include fatigue and sad.  Associated symptoms include no helplessness and no hopelessness.  Past treatments include nothing.  Past medical history includes thyroid problem.   Anxiety Presents for follow-up visit. Symptoms include depressed mood, excessive  worry and shortness of breath (when walking). Patient reports no irritability or  nervous/anxious behavior. Symptoms occur occasionally. The severity of symptoms is moderate.    Epistaxis  The bleeding has been from the left nare. This is a new problem. The current episode started more than 1 month ago. The problem has been waxing and waning. The treatment provided mild relief.      Review of Systems  Constitutional:  Positive for fatigue and malaise/fatigue. Negative for irritability.  HENT:  Positive for nosebleeds.   Eyes:  Positive for blurred vision.  Respiratory:  Positive for shortness of breath (when walking).   Gastrointestinal:  Positive for diarrhea and heartburn. Negative for constipation.  Musculoskeletal:  Positive for arthritis and stiffness.  Psychiatric/Behavioral:  Positive for depression. The patient is not nervous/anxious.   All other systems reviewed and are negative.      Objective:   Physical Exam Vitals reviewed.  Constitutional:      General: She is not in acute distress.    Appearance: She is well-developed. She is obese.  HENT:     Head: Normocephalic and atraumatic.     Right Ear: Tympanic membrane normal.     Left Ear: Tympanic membrane normal.  Eyes:     Pupils: Pupils are equal, round, and reactive to light.  Neck:     Thyroid: No thyromegaly.  Cardiovascular:     Rate and Rhythm: Normal rate and regular rhythm.     Heart sounds: Normal heart sounds. No murmur heard. Pulmonary:     Effort: Pulmonary effort is normal. No respiratory distress.     Breath sounds: Normal breath sounds. No wheezing.  Abdominal:     General: Bowel sounds are normal. There is no distension.     Palpations: Abdomen is soft.     Tenderness: There is no abdominal tenderness.  Musculoskeletal:        General: No tenderness. Normal range of motion.     Cervical back: Normal range of motion and neck supple.  Skin:    General: Skin is warm and dry.  Neurological:      Mental Status: She is alert and oriented to person, place, and time.     Cranial Nerves: No cranial nerve deficit.     Motor: Weakness (using rolling walker) present.     Deep Tendon Reflexes: Reflexes are normal and symmetric.  Psychiatric:        Behavior: Behavior normal.        Thought Content: Thought content normal.        Judgment: Judgment normal.       BP 129/77   Pulse 81   Temp 97.8 F (36.6 C) (Temporal)   Ht _0  (1.702 m)   Wt 206 lb 9.6 oz (93.7 kg)   BMI 32.36 kg/m      Assessment & Plan:  Lorraine Leonard comes in today with chief complaint of Medical Management of Chronic Issues (Nose bleeds, stopped magnesium and has not happen since. Cramps wants something for them. Swelling in ankles. )   Diagnosis and orders addressed:  1. Hypertension associated with diabetes ([place]) - CMP14+EGFR - CBC with Differential/Platelet  2. Moderate episode of recurrent major depressive disorder (HCC) - CMP14+EGFR - CBC with Differential/Platelet  3. Anxiety - CMP14+EGFR - CBC with Differential/Platelet  4. Hypothyroidism, unspecified type - CMP14+EGFR - CBC with Differential/Platelet - TSH  5. Vitamin D deficiency - CMP14+EGFR - CBC with Differential/Platelet  6. Hyperlipidemia associated with type 2 diabetes mellitus ([place]) - CMP14+EGFR - CBC with Differential/Platelet - Lipid panel  7. Slow transit constipation - CMP14+EGFR - CBC with Differential/Platelet  8. Obesity (BMI 30-39.9) - CMP14+EGFR - CBC with Differential/Platelet  9. Type 2 diabetes mellitus with diabetic polyneuropathy, without long-term current use of insulin (HCC) - Bayer DCA Hb A1c Waived - CMP14+EGFR - CBC with Differential/Platelet  10. Iron deficiency anemia due to chronic blood loss  - CMP14+EGFR - CBC with Differential/Platelet  11. Stage 3 chronic kidney disease, unspecified whether stage 3a or 3b CKD (HCC) - CMP14+EGFR - CBC with Differential/Platelet  12.  Osteoarthritis of multiple joints, unspecified osteoarthritis type - CMP14+EGFR - CBC with Differential/Platelet  13. Chronic obstructive pulmonary disease, unspecified COPD type ([place]) - CMP14+EGFR - CBC with Differential/Platelet  14. Atrial fibrillation, unspecified type (HCC) - CMP14+EGFR - CBC with Differential/Platelet  15. Acute diastolic CHF (congestive heart failure) (HCC) - CMP14+EGFR - CBC with Differential/Platelet  16. Epistaxis - CMP14+EGFR - CBC with Differential/Platelet   Labs pending Health Maintenance reviewed Diet and exercise encouraged  Follow up plan: 3 months    Evelina Dun, FNP

## 2022-02-25 NOTE — Patient Instructions (Signed)
Nosebleed, Adult A nosebleed is when blood comes out of the nose. Nosebleeds are common. Usually, they are not a sign of a serious condition. Nosebleeds can happen if a blood vessel in your nose starts to bleed or if the lining of your nose (mucous membrane) cracks. They are commonly caused by: Allergies. Colds. Picking your nose. Blowing your nose too hard. An injury from sticking an object into your nose or getting hit in the nose. Dry or cold air. Less common causes of nosebleeds include: Toxic fumes. Something abnormal in the nose or in the air-filled spaces in the bones of the face (sinuses). Growths in the nose, such as polyps. Blood thinners or conditions that cause blood to clot slowly. Certain illnesses or procedures that irritate or dry out the nasal passages. Follow these instructions at home: When you have a nosebleed:  Sit down and tilt your head slightly forward. Use a clean towel or tissue to pinch your nostrils under the bony part of your nose. After 5 minutes, let go of your nose and see if bleeding starts again. Do not release pressure before that time. If there is still bleeding, repeat the pinching and holding for 5 minutes or until the bleeding stops. Do not place tissues or gauze in the nose to stop the bleeding. Avoid lying down and avoid tilting your head backward. That may make blood collect in the throat and cause gagging or coughing. Use a nasal spray decongestant to help with a nosebleed as told by your health care provider. After a nosebleed: Avoid blowing your nose or sniffing for a number of hours. Avoid straining, lifting, or bending at the waist for several days. You may go back to other normal activities as you are able. If you are taking aspirin or blood thinners and you have nosebleeds, talk to your health care provider. These medicines make bleeding more likely. Ask your health care provider if you should stop taking the medicines or if you should  adjust the dose. Do not stop taking medicines that your health care provider has recommended unless he or she tells you to stop taking them. If your nosebleed was caused by dry mucous membranes, use over-the-counter saline nasal spray or gel and a humidifier as told by your health care provider. This will keep the mucous membranes moist and allow them to heal. If you need to use one of these products: Choose one that is water-soluble. Use only as much as you need and use it only as often as needed. Do not lie down right after you use it. If you get nosebleeds often, talk with your health care provider about medical treatments. Options may include: Nasal cautery. This treatment stops and prevents nosebleeds by using a chemical swab or electrical device to lightly burn tiny blood vessels inside the nose. Nasal packing. A gauze or other material is placed in the nose to keep constant pressure on the bleeding area. Contact a health care provider if you: Have a fever. Get nosebleeds often or more often than usual. Bruise very easily. Have a nosebleed from having something stuck in your nose. Have bleeding in your mouth. Vomit or cough up brown material. Have a nosebleed after you start a new medicine. Get help right away if: You have a nosebleed after a fall or a head injury. Your nosebleed does not go away after 20 minutes. You feel dizzy or weak. You have unusual bleeding from other parts of your body. You have unusual bruising on  other parts of your body. You become sweaty. You vomit blood. Summary A nosebleed is when blood comes out of the nose. Common causes include allergies, an injury to the nose, or cold or dry air. Initial treatment includes applying pressure for 5 minutes. Moisturizing the nose with saline nasal spray or gel after a nosebleed may help prevent future bleeding. Get help right away if your nosebleed does not go away after 20 minutes. This information is not intended  to replace advice given to you by your health care provider. Make sure you discuss any questions you have with your health care provider. Document Revised: 02/03/2019 Document Reviewed: 02/03/2019 Elsevier Patient Education  2022 [employer].

## 2022-02-26 LAB — CBC WITH DIFFERENTIAL/PLATELET
Basophils Absolute: 0 10*3/uL (ref 0.0–0.2)
Basos: 0 %
EOS (ABSOLUTE): 0.1 10*3/uL (ref 0.0–0.4)
Eos: 0 %
Hematocrit: 38.1 % (ref 34.0–46.6)
Hemoglobin: 12 g/dL (ref 11.1–15.9)
Immature Grans (Abs): 0 10*3/uL (ref 0.0–0.1)
Immature Granulocytes: 0 %
Lymphocytes Absolute: 2.2 10*3/uL (ref 0.7–3.1)
Lymphs: 18 %
MCH: 27.5 pg (ref 26.6–33.0)
MCHC: 31.5 g/dL (ref 31.5–35.7)
MCV: 87 fL (ref 79–97)
Monocytes Absolute: 1 10*3/uL — ABNORMAL HIGH (ref 0.1–0.9)
Monocytes: 8 %
Neutrophils Absolute: 8.5 10*3/uL — ABNORMAL HIGH (ref 1.4–7.0)
Neutrophils: 74 %
Platelets: 286 10*3/uL (ref 150–450)
RBC: 4.37 x10E6/uL (ref 3.77–5.28)
RDW: 13.9 % (ref 11.7–15.4)
WBC: 11.7 10*3/uL — ABNORMAL HIGH (ref 3.4–10.8)

## 2022-02-26 LAB — CMP14+EGFR
ALT: 18 IU/L (ref 0–32)
AST: 21 IU/L (ref 0–40)
Albumin/Globulin Ratio: 1.3 (ref 1.2–2.2)
Albumin: 4 g/dL (ref 3.7–4.7)
Alkaline Phosphatase: 59 IU/L (ref 44–121)
BUN/Creatinine Ratio: 21 (ref 12–28)
BUN: 26 mg/dL (ref 8–27)
Bilirubin Total: 0.4 mg/dL (ref 0.0–1.2)
CO2: 24 mmol/L (ref 20–29)
Calcium: 10 mg/dL (ref 8.7–10.3)
Chloride: 99 mmol/L (ref 96–106)
Creatinine, Ser: 1.21 mg/dL — ABNORMAL HIGH (ref 0.57–1.00)
Globulin, Total: 3 g/dL (ref 1.5–4.5)
Glucose: 134 mg/dL — ABNORMAL HIGH (ref 70–99)
Potassium: 4.5 mmol/L (ref 3.5–5.2)
Sodium: 140 mmol/L (ref 134–144)
Total Protein: 7 g/dL (ref 6.0–8.5)
eGFR: 45 mL/min/{1.73_m2} — ABNORMAL LOW (ref >59)

## 2022-02-26 LAB — LIPID PANEL
Chol/HDL Ratio: 3.1 ratio (ref 0.0–4.4)
Cholesterol, Total: 146 mg/dL (ref 100–199)
HDL: 47 mg/dL (ref >39)
LDL Chol Calc (NIH): 64 mg/dL (ref 0–99)
Triglycerides: 216 mg/dL — ABNORMAL HIGH (ref 0–149)
VLDL Cholesterol Cal: 35 mg/dL (ref 5–40)

## 2022-02-26 LAB — TSH: TSH: 0.914 u[IU]/mL (ref 0.450–4.500)

## 2022-03-12 ENCOUNTER — Other Ambulatory Visit: Payer: Self-pay | Admitting: Family

## 2022-03-12 DIAGNOSIS — E1142 Type 2 diabetes mellitus with diabetic polyneuropathy: Secondary | ICD-10-CM

## 2022-03-21 ENCOUNTER — Other Ambulatory Visit: Payer: Self-pay | Admitting: Family

## 2022-03-21 DIAGNOSIS — E559 Vitamin D deficiency, unspecified: Secondary | ICD-10-CM

## 2022-03-25 ENCOUNTER — Ambulatory Visit (INDEPENDENT_AMBULATORY_CARE_PROVIDER_SITE_OTHER): Payer: Medicare Other

## 2022-03-25 ENCOUNTER — Other Ambulatory Visit: Payer: Self-pay | Admitting: Family

## 2022-03-25 DIAGNOSIS — I442 Atrioventricular block, complete: Secondary | ICD-10-CM

## 2022-03-25 LAB — CUP PACEART REMOTE DEVICE CHECK
Battery Remaining Longevity: 43 mo
Battery Voltage: 2.87 V
Brady Statistic AP VP Percent: 4.99 %
Brady Statistic AP VS Percent: 71.09 %
Brady Statistic AS VP Percent: 0 %
Brady Statistic AS VS Percent: 23.92 %
Brady Statistic RA Percent Paced: 97.72 %
Brady Statistic RV Percent Paced: 4.99 %
Date Time Interrogation Session: 20231205023110
Implantable Lead Connection Status: 753985
Implantable Lead Connection Status: 753985
Implantable Lead Implant Date: 20190128
Implantable Lead Implant Date: 20190128
Implantable Lead Location: 753860
Implantable Lead Location: 753860
Implantable Lead Model: 3830
Implantable Lead Model: 5076
Implantable Pulse Generator Implant Date: 20190128
Lead Channel Impedance Value: 304 Ohm
Lead Channel Impedance Value: 342 Ohm
Lead Channel Impedance Value: 380 Ohm
Lead Channel Impedance Value: 399 Ohm
Lead Channel Sensing Intrinsic Amplitude: 2.75 mV
Lead Channel Sensing Intrinsic Amplitude: 2.75 mV
Lead Channel Sensing Intrinsic Amplitude: 8.875 mV
Lead Channel Sensing Intrinsic Amplitude: 8.875 mV
Lead Channel Setting Pacing Amplitude: 2.5 V
Lead Channel Setting Pacing Amplitude: 2.5 V
Lead Channel Setting Pacing Pulse Width: 0.4 ms
Lead Channel Setting Sensing Sensitivity: 0.9 mV
Zone Setting Status: 755011
Zone Setting Status: 755011

## 2022-03-27 DIAGNOSIS — I509 Heart failure, unspecified: Secondary | ICD-10-CM | POA: Diagnosis not present

## 2022-03-27 DIAGNOSIS — J449 Chronic obstructive pulmonary disease, unspecified: Secondary | ICD-10-CM | POA: Diagnosis not present

## 2022-03-27 DIAGNOSIS — J8410 Pulmonary fibrosis, unspecified: Secondary | ICD-10-CM | POA: Diagnosis not present

## 2022-03-28 ENCOUNTER — Ambulatory Visit (INDEPENDENT_AMBULATORY_CARE_PROVIDER_SITE_OTHER): Payer: Medicare Other

## 2022-03-28 VITALS — Ht 67.0 in | Wt 205.0 lb

## 2022-03-28 DIAGNOSIS — Z0000 Encounter for general adult medical examination without abnormal findings: Secondary | ICD-10-CM | POA: Diagnosis not present

## 2022-03-28 IMAGING — CT CT HIP*R* W/O CM
3 of 4 series · 14 of 35 positions shown, 17 images · non-contrast
Comparison: None.

CLINICAL DATA: Right hip and leg pain.

EXAM:
CT OF THE RIGHT HIP WITHOUT CONTRAST
TECHNIQUE: Multidetector CT imaging of the right hip was performed according to
the standard protocol. Multiplanar CT image reconstructions were
also generated.

[Series 6: 3 axial soft · axial · 0.91mm/px · z∈[-832,-606]mm · 8 of 135 slices shown, 10 images]
[im 11/135  soft-tissue]
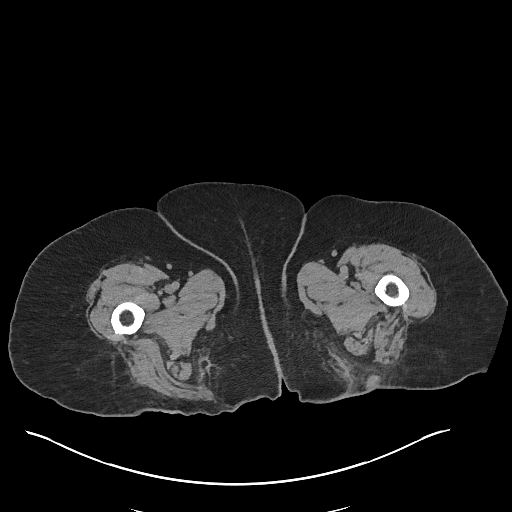
[im 11/135  bone]
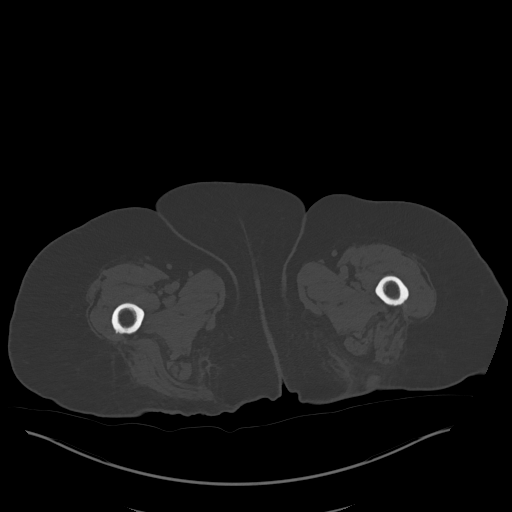
[im 31/135  bone]
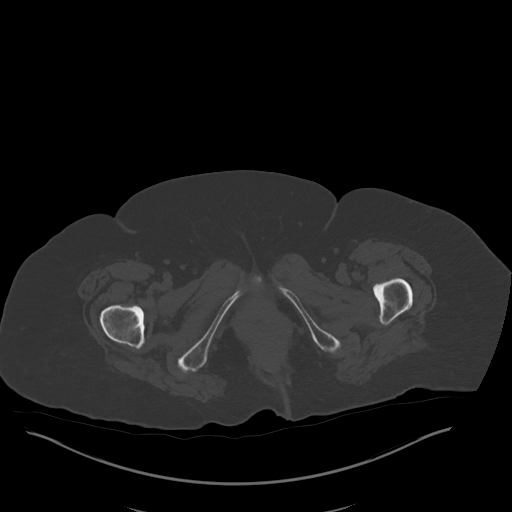
[im 42/135  bone]
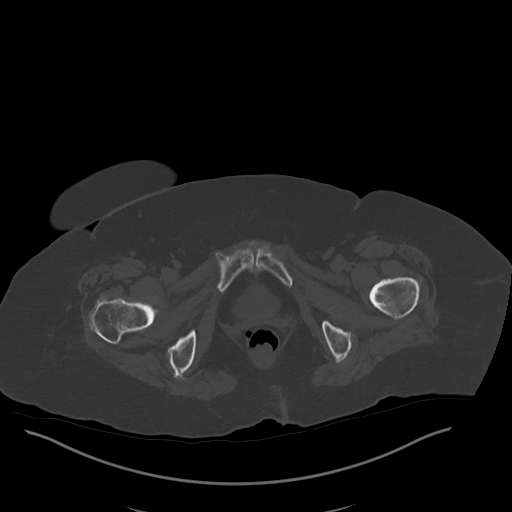
[im 62/135  bone]
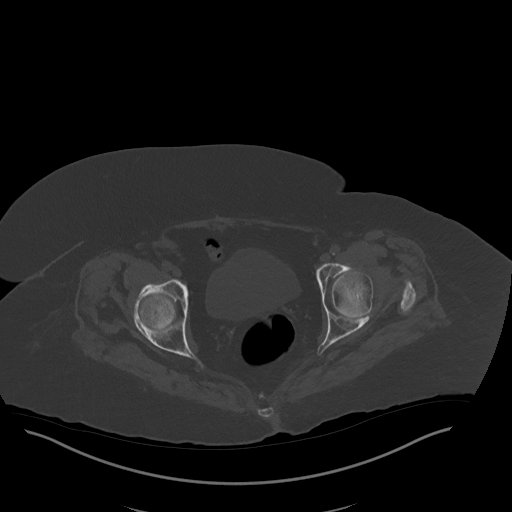
[im 73/135  soft-tissue]
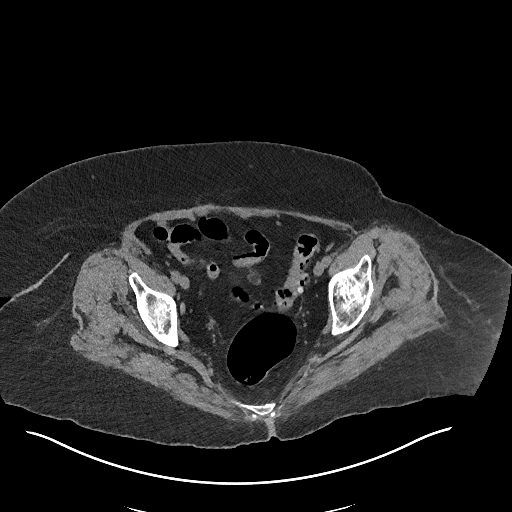
[im 73/135  bone]
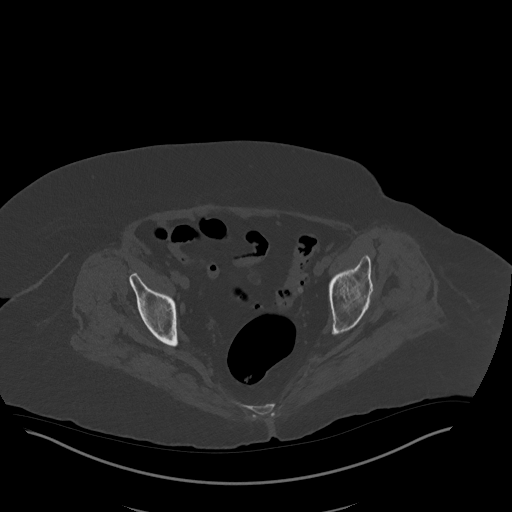
[im 93/135  bone]
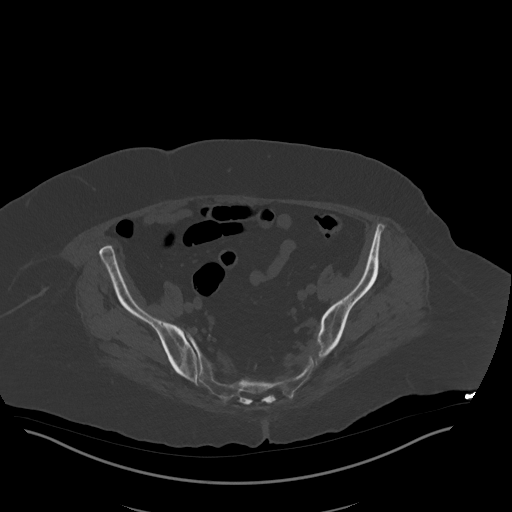
[im 104/135  bone]
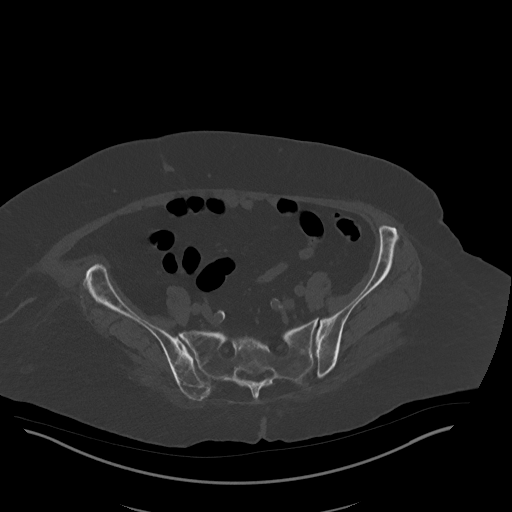
[im 124/135  bone]
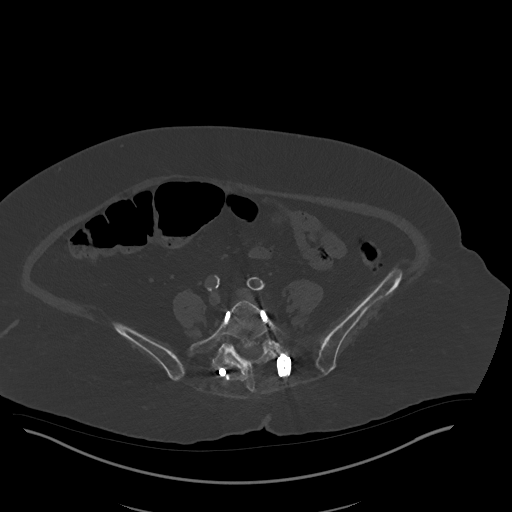

[Series 7: coronal bone · coronal · 0.59mm/px · 1 of 155 slices shown]
[im 78/155  bone]
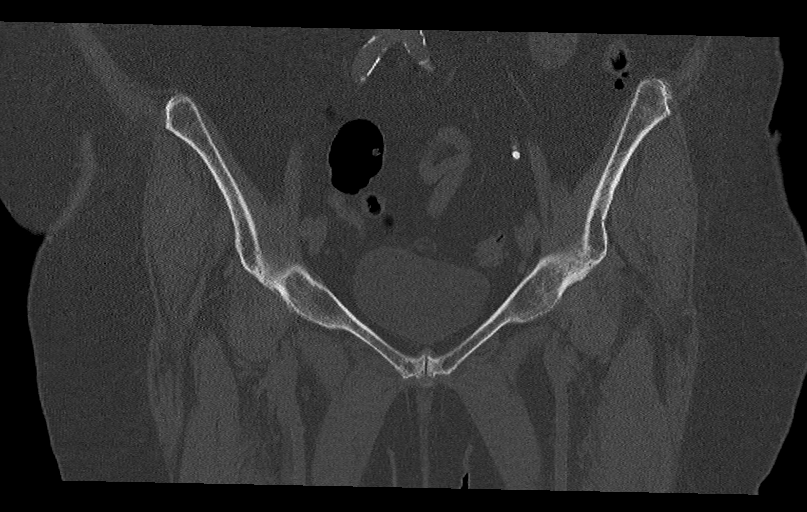

[Series 10: sagittal st · sagittal · 0.54mm/px · 5 of 257 slices shown, 6 images]
[im 86/257  bone]
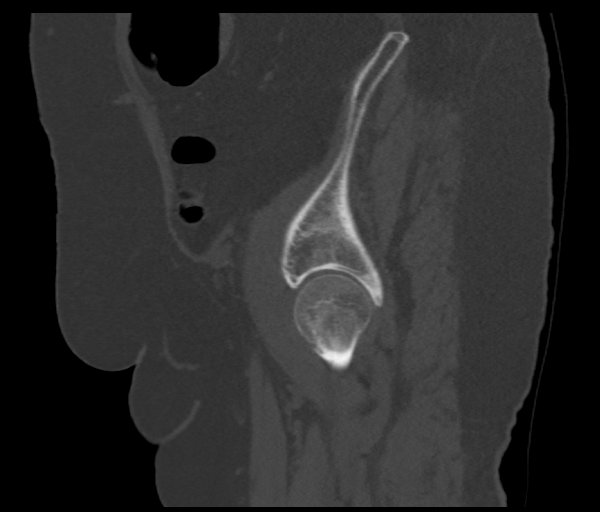
[im 107/257  bone]
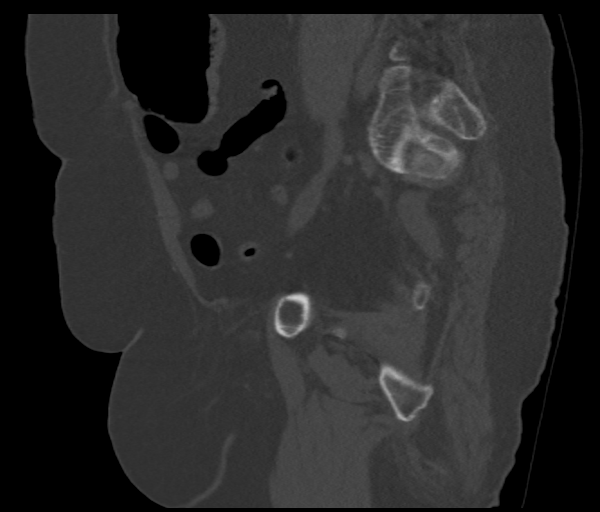
[im 129/257  soft-tissue]
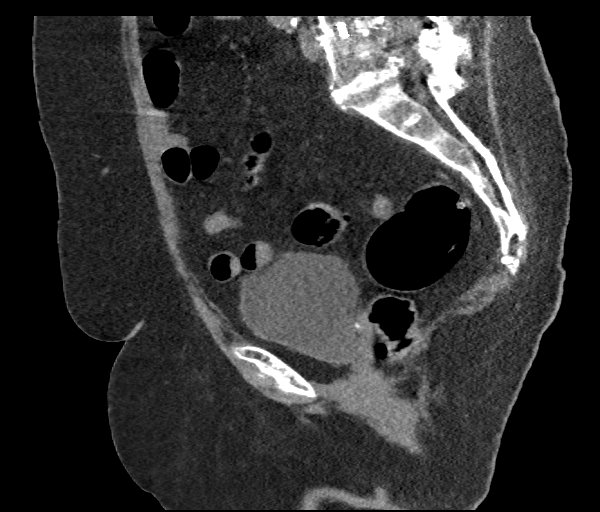
[im 129/257  bone]
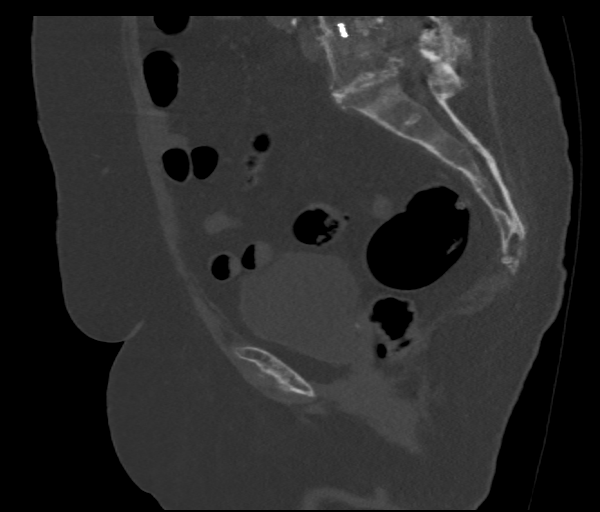
[im 150/257  bone]
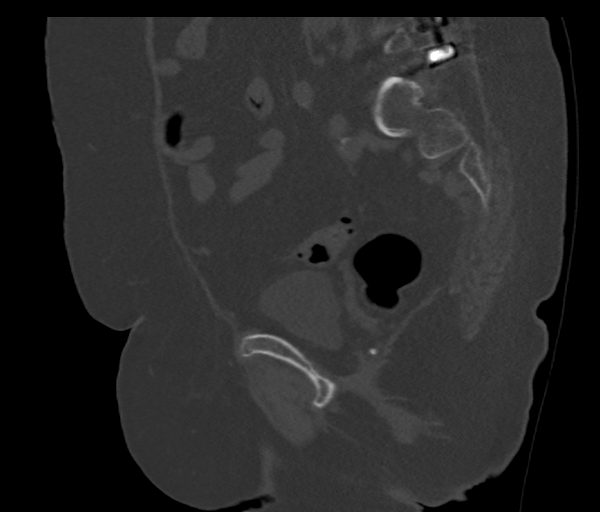
[im 171/257  bone]
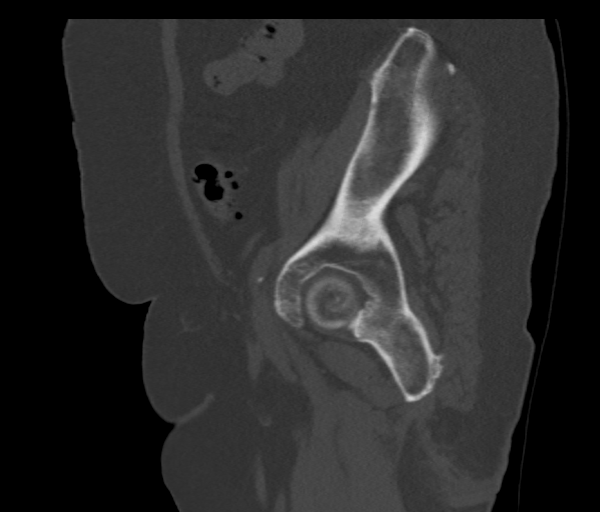

[14 of 35 positions shown; findings below may reference images not displayed]

FINDINGS: Postoperative changes from posterior fusion in the visualized lower
lumbar spine. Mild degenerative changes in the hips bilaterally. No
fracture, subluxation or dislocation.

Aortoiliac atherosclerosis. Sigmoid diverticulosis. Small bowel
decompressed. Prior hysterectomy. No pelvic masses or free fluid.
IMPRESSION: Mild degenerative changes in the hips bilaterally. No acute bony
abnormality.

Aortoiliac atherosclerosis.

Sigmoid diverticulosis.

## 2022-03-28 NOTE — Patient Instructions (Signed)
Lorraine Leonard , Thank you for taking time to come for your Medicare Wellness Visit. I appreciate your ongoing commitment to your health goals. Please review the following plan we discussed and let me know if I can assist you in the future.   These are the goals we discussed:  Goals       "I want to stop getting bladder infections" (pt-stated)      Exercise 150 min/wk Moderate Activity      Chair exercises daily. Handout given.       Patient Stated (pt-stated)      Current Barriers:  Knowledge Deficits related to how to obtain a lift chair.  Nurse Case Manager Clinical Goal(s):  Over the next 14 days, patient will verbalize understanding of plan for obtaining a lift chair through [employer].  Interventions:  Planned collaboration with [employer] regarding cost of lift chair Planned collaboration with insurance company regarding coverage of lift chair Discussed plans with patient for ongoing care management follow up and provided patient with direct contact information for care management team  Patient Self Care Activities:  Calls pharmacy for medication refills Calls provider office for new concerns or questions  Initial goal documentation          This is a list of the screening recommended for you and due dates:  Health Maintenance  Topic Date Due   COVID-19 Vaccine (1) Never done   Yearly kidney health urinalysis for diabetes  04/30/2022   Complete foot exam   04/30/2022   Hemoglobin A1C  08/26/2022   Eye exam for diabetics  10/02/2022   Yearly kidney function blood test for diabetes  02/26/2023   Medicare Annual Wellness Visit  03/29/2023   DTaP/Tdap/Td vaccine (2 - Td or Tdap) 08/29/2029   Pneumonia Vaccine  Completed   Flu Shot  Completed   DEXA scan (bone density measurement)  Completed   Zoster (Shingles) Vaccine  Completed   HPV Vaccine  Aged Out    Advanced directives: Advance directive discussed with you today. I have provided a copy for you  to complete at home and have notarized. Once this is complete please bring a copy in to our office so we can scan it into your chart.   Conditions/risks identified: Aim for 30 minutes of exercise or brisk walking, 6-8 glasses of water, and 5 servings of fruits and vegetables each day.   Next appointment: Follow up in one year for your annual wellness visit    Preventive Care 65 Years and Older, Female Preventive care refers to lifestyle choices and visits with your health care provider that can promote health and wellness. What does preventive care include? A yearly physical exam. This is also called an annual well check. Dental exams once or twice a year. Routine eye exams. Ask your health care provider how often you should have your eyes checked. Personal lifestyle choices, including: Daily care of your teeth and gums. Regular physical activity. Eating a healthy diet. Avoiding tobacco and drug use. Limiting alcohol use. Practicing safe sex. Taking low-dose aspirin every day. Taking vitamin and mineral supplements as recommended by your health care provider. What happens during an annual well check? The services and screenings done by your health care provider during your annual well check will depend on your age, overall health, lifestyle risk factors, and family history of disease. Counseling  Your health care provider may ask you questions about your: Alcohol use. Tobacco use. Drug use. Emotional well-being. Home and relationship  well-being. Sexual activity. Eating habits. History of falls. Memory and ability to understand (cognition). Work and work Statistician. Reproductive health. Screening  You may have the following tests or measurements: Height, weight, and BMI. Blood pressure. Lipid and cholesterol levels. These may be checked every 5 years, or more frequently if you are over 80 years old. Skin check. Lung cancer screening. You may have this screening every year  starting at age 48 if you have a 30-pack-year history of smoking and currently smoke or have quit within the past 15 years. Fecal occult blood test (FOBT) of the stool. You may have this test every year starting at age 44. Flexible sigmoidoscopy or colonoscopy. You may have a sigmoidoscopy every 5 years or a colonoscopy every 10 years starting at age 11. Hepatitis C blood test. Hepatitis B blood test. Sexually transmitted disease (STD) testing. Diabetes screening. This is done by checking your blood sugar (glucose) after you have not eaten for a while (fasting). You may have this done every 1-3 years. Bone density scan. This is done to screen for osteoporosis. You may have this done starting at age 36. Mammogram. This may be done every 1-2 years. Talk to your health care provider about how often you should have regular mammograms. Talk with your health care provider about your test results, treatment options, and if necessary, the need for more tests. Vaccines  Your health care provider may recommend certain vaccines, such as: Influenza vaccine. This is recommended every year. Tetanus, diphtheria, and acellular pertussis (Tdap, Td) vaccine. You may need a Td booster every 10 years. Zoster vaccine. You may need this after age 28. Pneumococcal 13-valent conjugate (PCV13) vaccine. One dose is recommended after age 88. Pneumococcal polysaccharide (PPSV23) vaccine. One dose is recommended after age 65. Talk to your health care provider about which screenings and vaccines you need and how often you need them. This information is not intended to replace advice given to you by your health care provider. Make sure you discuss any questions you have with your health care provider. Document Released: 05/04/2015 Document Revised: 12/26/2015 Document Reviewed: 02/06/2015 Elsevier Interactive Patient Education  2017 [place] Prevention in the Home Falls can cause injuries. They can happen to  people of all ages. There are many things you can do to make your home safe and to help prevent falls. What can I do on the outside of my home? Regularly fix the edges of walkways and driveways and fix any cracks. Remove anything that might make you trip as you walk through a door, such as a raised step or threshold. Trim any bushes or trees on the path to your home. Use bright outdoor lighting. Clear any walking paths of anything that might make someone trip, such as rocks or tools. Regularly check to see if handrails are loose or broken. Make sure that both sides of any steps have handrails. Any raised decks and porches should have guardrails on the edges. Have any leaves, snow, or ice cleared regularly. Use sand or salt on walking paths during winter. Clean up any spills in your garage right away. This includes oil or grease spills. What can I do in the bathroom? Use night lights. Install grab bars by the toilet and in the tub and shower. Do not use towel bars as grab bars. Use non-skid mats or decals in the tub or shower. If you need to sit down in the shower, use a plastic, non-slip stool. Keep the floor dry. Clean  up any water that spills on the floor as soon as it happens. Remove soap buildup in the tub or shower regularly. Attach bath mats securely with double-sided non-slip rug tape. Do not have throw rugs and other things on the floor that can make you trip. What can I do in the bedroom? Use night lights. Make sure that you have a light by your bed that is easy to reach. Do not use any sheets or blankets that are too big for your bed. They should not hang down onto the floor. Have a firm chair that has side arms. You can use this for support while you get dressed. Do not have throw rugs and other things on the floor that can make you trip. What can I do in the kitchen? Clean up any spills right away. Avoid walking on wet floors. Keep items that you use a lot in easy-to-reach  places. If you need to reach something above you, use a strong step stool that has a grab bar. Keep electrical cords out of the way. Do not use floor polish or wax that makes floors slippery. If you must use wax, use non-skid floor wax. Do not have throw rugs and other things on the floor that can make you trip. What can I do with my stairs? Do not leave any items on the stairs. Make sure that there are handrails on both sides of the stairs and use them. Fix handrails that are broken or loose. Make sure that handrails are as long as the stairways. Check any carpeting to make sure that it is firmly attached to the stairs. Fix any carpet that is loose or worn. Avoid having throw rugs at the top or bottom of the stairs. If you do have throw rugs, attach them to the floor with carpet tape. Make sure that you have a light switch at the top of the stairs and the bottom of the stairs. If you do not have them, ask someone to add them for you. What else can I do to help prevent falls? Wear shoes that: Do not have high heels. Have rubber bottoms. Are comfortable and fit you well. Are closed at the toe. Do not wear sandals. If you use a stepladder: Make sure that it is fully opened. Do not climb a closed stepladder. Make sure that both sides of the stepladder are locked into place. Ask someone to hold it for you, if possible. Clearly mark and make sure that you can see: Any grab bars or handrails. First and last steps. Where the edge of each step is. Use tools that help you move around (mobility aids) if they are needed. These include: Canes. Walkers. Scooters. Crutches. Turn on the lights when you go into a dark area. Replace any light bulbs as soon as they burn out. Set up your furniture so you have a clear path. Avoid moving your furniture around. If any of your floors are uneven, fix them. If there are any pets around you, be aware of where they are. Review your medicines with your doctor.  Some medicines can make you feel dizzy. This can increase your chance of falling. Ask your doctor what other things that you can do to help prevent falls. This information is not intended to replace advice given to you by your health care provider. Make sure you discuss any questions you have with your health care provider. Document Released: 02/01/2009 Document Revised: 09/13/2015 Document Reviewed: 05/12/2014 Elsevier Interactive Patient Education  2017 [place].

## 2022-03-28 NOTE — Progress Notes (Signed)
Subjective:   Lorraine Leonard is a 81 y.o. female who presents for Medicare Annual (Subsequent) preventive examination. I connected with  Lorraine Leonard on 03/28/22 by a audio enabled telemedicine application and verified that I am speaking with the correct person using two identifiers.  Patient Location: Home  Provider Location: Home Office  I discussed the limitations of evaluation and management by telemedicine. The patient expressed understanding and agreed to proceed.  Review of Systems     Cardiac Risk Factors include: advanced age (>19mn, >>34women);hypertension;diabetes mellitus     Objective:    Today's Vitals   03/28/22 1036  Weight: 205 lb (93 kg)  Height: '5\' 7"'$  (1.702 m)   Body mass index is 32.11 kg/m.     03/28/2022   10:40 AM 12/30/2021   12:44 PM 03/27/2021    1:37 PM 07/08/2020    2:00 AM 07/07/2020    6:37 PM 07/01/2020    6:22 PM 03/26/2020    1:17 PM  Advanced Directives  Does Patient Have a Medical Advance Directive? No Yes No No Yes No No  Type of ACorporate treasurerof AManteeLiving will       Does patient want to make changes to medical advance directive?  No - Patient declined       Copy of [place] Chart?  No - copy requested       Would patient like information on creating a medical advance directive? No - Patient declined  No - Patient declined No - Patient declined  No - Patient declined No - Patient declined    Current Medications (verified) Outpatient Encounter Medications as of 03/28/2022  Medication Sig   albuterol (VENTOLIN HFA) 108 (90 Base) MCG/ACT inhaler INHALE 2 PUFFS INTO THE LUNGS EVERY 6 HOURS AS NEEDED FOR WHEEZE OR SHORTNESS OF BREATH   atenolol (TENORMIN) 25 MG tablet TAKE 1 TABLET (25 MG TOTAL) BY MOUTH DAILY.   calcium citrate (CALCITRATE - DOSED IN MG ELEMENTAL CALCIUM) 950 MG tablet Take 1 tablet by mouth 2 (two) times daily.    cyanocobalamin 1000 MCG tablet Take 1,000 mcg by mouth  daily. Vitamin b12 every AM   diclofenac Sodium (VOLTAREN) 1 % GEL APPLY 4 GRAMS TOPICALLY 4 TIMES A DAY   ELIQUIS 5 MG TABS tablet TAKE 1 TABLET BY MOUTH TWICE A DAY   estradiol (ESTRACE VAGINAL) 0.1 MG/GM vaginal cream Place 1 Applicatorful vaginally at bedtime.   fluticasone (FLONASE) 50 MCG/ACT nasal spray PLACE 1 SPRAY INTO BOTH NOSTRILS 2 (TWO) TIMES DAILY AS NEEDED FOR ALLERGIES OR RHINITIS.   furosemide (LASIX) 40 MG tablet TAKE 1 TABLET BY MOUTH EVERY DAY   levocetirizine (XYZAL) 5 MG tablet TAKE 1 TABLET BY MOUTH EVERY DAY IN THE EVENING   levothyroxine (SYNTHROID) 100 MCG tablet Take 1 tablet (100 mcg total) by mouth daily.   metFORMIN (GLUCOPHAGE-XR) 500 MG 24 hr tablet TAKE 1 TABLET BY MOUTH EVERY DAY WITH BREAKFAST   Multiple Vitamins-Minerals (PRESERVISION AREDS 2) CAPS Take 1 capsule by mouth 2 (two) times daily.   Olopatadine HCl 0.2 % SOLN INSTILL 1 DROP INTO BOTH EYES EVERY DAY   omeprazole (PRILOSEC) 20 MG capsule TAKE 1 CAPSULE BY MOUTH EVERY DAY   ondansetron (ZOFRAN ODT) 4 MG disintegrating tablet Take 1 tablet (4 mg total) by mouth every 8 (eight) hours as needed for nausea or vomiting.   OXYGEN Inhale 3 L into the lungs every evening.   Polyethyl  Glycol-Propyl Glycol (SYSTANE) 0.4-0.3 % GEL ophthalmic gel Place 1 application into both eyes at bedtime as needed (dry eyes).   predniSONE (DELTASONE) 5 MG tablet Take 1 tablet (5 mg total) by mouth 2 (two) times daily with a meal.   rosuvastatin (CRESTOR) 10 MG tablet TAKE 1 TABLET BY MOUTH EVERYDAY AT BEDTIME   spironolactone (ALDACTONE) 25 MG tablet TAKE 1 TABLET (25 MG TOTAL) BY MOUTH DAILY.   vitamin C (ASCORBIC ACID) 500 MG tablet Take 1,000 mg by mouth daily. AM   Vitamin D, Ergocalciferol, (DRISDOL) 1.25 MG (50000 UNIT) CAPS capsule TAKE 1 CAPSULE (50,000 UNITS TOTAL) BY MOUTH EVERY 7 (SEVEN) DAYS   No facility-administered encounter medications on file as of 03/28/2022.    Allergies (verified) Allopurinol, Mobic  [meloxicam], and Macrodantin [nitrofurantoin macrocrystal]   History: Past Medical History:  Diagnosis Date   Acute diastolic CHF (congestive heart failure) (HCC) 11/11/2016   Allergic rhinitis    Anxiety    Anxiety    Arthritis    "~ all my joints" (05/18/2017)   Atrial fibrillation ([place]) 09/2016   Atrial fibrillation with RVR ([place]) 05/18/2017   Chronic lower back pain    COPD (chronic obstructive pulmonary disease) (HCC)    Depression    Diverticulosis    Dyspnea    Dysrhythmia    GERD (gastroesophageal reflux disease)    Glaucoma    Gout    Heart disease    History of blood transfusion 1983   "when I gave a 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"   Hyperlipidemia    Hypertension    Hypothyroidism    Macular degeneration of both eyes    Neuropathy    On home oxygen therapy    "3L; 24/7" (05/18/2017)   Pre-diabetes    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   Pulmonary fibrosis (HCC)    Pulmonary fibrosis (HCC)    Rheumatoid arthritis (HCC)    Sleep apnea    uses 3 liters of o2 24/7   Urticaria    Past Surgical History:  Procedure Laterality Date   AV NODE ABLATION  05/18/2017   AV NODE ABLATION N/A 05/18/2017   Procedure: AV NODE ABLATION;  Surgeon: Evans Lance, MD;  Location: [facility];  Service: Cardiovascular;  Laterality: N/A;   BACK SURGERY     BREAST LUMPECTOMY Left    CARDIOVERSION N/A 01/30/2017   Procedure: CARDIOVERSION;  Surgeon: Josue Hector, MD;  [facility];  Service: Cardiovascular;  Laterality: N/A;   CARDIOVERSION N/A 04/09/2017   Procedure: CARDIOVERSION;  Surgeon: Satira Sark, MD;  [facility];  Service: Cardiovascular;  Laterality: N/A;   CATARACT EXTRACTION W/ INTRAOCULAR LENS  IMPLANT, BILATERAL Bilateral    DILATION AND CURETTAGE OF UTERUS     EUS N/A 10/03/2015   Procedure: UPPER ENDOSCOPIC ULTRASOUND (EUS) RADIAL;  Surgeon: Arta Silence, MD;  Location: [facility];  Service: Endoscopy;  Laterality:  N/A;   FINE NEEDLE ASPIRATION N/A 10/03/2015   Procedure: FINE NEEDLE ASPIRATION (FNA) RADIAL;  Surgeon: Arta Silence, MD;  Location: [facility];  Service: Endoscopy;  Laterality: N/A;   INSERT / REPLACE / REMOVE PACEMAKER  05/18/2017   LUMBAR LAMINECTOMY  1995; 06/23/2008   L4-5/notes 08/20/2010   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   NEPHRECTOMY TRANSPLANTED ORGAN     PACEMAKER IMPLANT N/A 05/18/2017   Procedure: PACEMAKER IMPLANT;  Surgeon: Evans Lance, MD;  Location: [facility];  Service: Cardiovascular;  Laterality: N/A;   TOTAL ABDOMINAL HYSTERECTOMY     TUBAL LIGATION     Family History  Problem Relation Age of Onset   Diabetes Other    Hypertension Other    Coronary artery disease Other    Stroke Other    Asthma Other    Kidney disease Sister    Cancer Sister        lung   Lung cancer Sister    Cancer Sister        lung   Lung cancer Sister    Cancer Sister        lung   Suicidality Son        committed suicide in 2009   CVA Son    Hyperlipidemia Other    Anxiety disorder Other    Migraines Other    Heart failure Father    Healthy Brother    Cancer Sister        lung   Healthy Sister    Social History   Socioeconomic History   Marital status: Widowed    Spouse name: Rufus Cypert   Number of children: 4   Years of education: 9   Highest education level: 9th grade  Occupational History   Occupation: retired    Comment: cna  Tobacco Use   Smoking status: Never    Passive exposure: Never   Smokeless tobacco: Never  Vaping Use   Vaping Use: Never used  Substance and Sexual Activity   Alcohol use: No   Drug use: No   Sexual activity: Not Currently    Partners: Male    Birth control/protection: Surgical  Other Topics Concern   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   Pt lives alone in single wide trailer. They have 4 children and 13  grandchildren.    Social Determinants of Health   Financial Resource Strain: Low Risk  (03/28/2022)   Overall Financial Resource Strain (CARDIA)    Difficulty of Paying Living Expenses: Not hard at all  Food Insecurity: No Food Insecurity (03/28/2022)   Hunger Vital Sign    Worried About Running Out of Food in the Last Year: Never true    Ran Out of Food in the Last Year: Never true  Transportation Needs: No Transportation Needs (03/28/2022)   PRAPARE - Hydrologist (Medical): No    Lack of Transportation (Non-Medical): No  Physical Activity: Insufficiently Active (03/28/2022)   Exercise Vital Sign    Days of Exercise per Week: 3 days    Minutes of Exercise per Session: 30 min  Stress: No Stress Concern Present (03/28/2022)   [place]    Feeling of Stress : Not at all  Social Connections: Moderately Isolated (03/28/2022)   Social Connection and Isolation Panel [NHANES]    Frequency of Communication with Friends and Family: More than three times a week    Frequency of Social Gatherings with Friends and Family: More than three times a week    Attends Religious Services: 1 to 4 times per year    Active Member of [employer] or Organizations: No    Attends Archivist Meetings: Never    Marital Status: Widowed    Tobacco Counseling Counseling given: Not Answered   Clinical Intake:  Pre-visit preparation completed: Yes  Pain :  No/denies pain     Nutritional Risks: None Diabetes: No  How often do you need to have someone help you when you read instructions, pamphlets, or other written materials from your doctor or pharmacy?: 1 - Never  Diabetic?yes  Nutrition Risk Assessment:  Has the patient had any N/V/D within the last 2 months?  No  Does the patient have any non-healing wounds?  No  Has the patient had any unintentional weight loss or weight gain?  No   Diabetes:  Is  the patient diabetic?  Yes  If diabetic, was a CBG obtained today?  No  Did the patient bring in their glucometer from home?  No  How often do you monitor your CBG's? Never .   Financial Strains and Diabetes Management:  Are you having any financial strains with the device, your supplies or your medication? No .  Does the patient want to be seen by Chronic Care Management for management of their diabetes?  No  Would the patient like to be referred to a Nutritionist or for Diabetic Management?  No   Diabetic Exams:  Diabetic Eye Exam: Completed 09/2021 Diabetic Foot Exam: Overdue, Pt has been advised about the importance in completing this exam. Pt is scheduled for diabetic foot exam on next office visit .   Interpreter Needed?: No  Information entered by :: Jadene Pierini, LPN   Activities of Daily Living    03/28/2022   10:40 AM 12/30/2021   12:41 PM  In your present state of health, do you have any difficulty performing the following activities:  Hearing? 0 0  Vision? 0 1  Comment  Macular Degeneration  Difficulty concentrating or making decisions? 0 0  Walking or climbing stairs? 0 1  Comment  Unsteady Gait/Balance  Dressing or bathing? 0 1  Comment  Granddaughter Assists  Doing errands, shopping? 0 1  Comment  Granddaughter Assists  Preparing Food and eating ? N N  Using the Toilet? N N  In the past six months, have you accidently leaked urine? N N  Do you have problems with loss of bowel control? N N  Managing your Medications? N Y  Comment  Granddaughter Assists  Managing your Finances? N Y  Comment  Granddaughter Assists  Housekeeping or managing your Housekeeping? Aggie Moats  Comment  Granddaughter Assists    Patient Care Team: Sharion Balloon, FNP as PCP - General (Family Medicine) Evans Lance, MD as PCP - Electrophysiology (Cardiology) Harl Bowie Alphonse Guild, MD as PCP - Cardiology (Cardiology) Tanda Rockers, MD as Consulting Physician (Pulmonary  Disease) Franchot Gallo, MD as Consulting Physician (Urology) Celestia Khat, OD (Optometry)  Indicate any recent Medical Services you may have received from other than [facility] providers in the past year (date may be approximate).     Assessment:   This is a routine wellness examination for Cyntha.  Hearing/Vision screen Vision Screening - Comments:: Wears rx glasses - up to date with routine eye exams with  Dr.johnson    Dietary issues and exercise activities discussed: Current Exercise Habits: Home exercise routine, Type of exercise: walking, Time (Minutes): 20, Frequency (Times/Week): 3, Weekly Exercise (Minutes/Week): 60, Intensity: Mild, Exercise limited by: None identified   Goals Addressed             This Visit's Progress    Exercise 150 min/wk Moderate Activity   On track    Chair exercises daily. Handout given.        Depression Screen  03/28/2022   10:39 AM 02/25/2022    3:27 PM 12/30/2021   12:38 PM 11/12/2021    1:51 PM 08/06/2021    2:08 PM 04/30/2021    3:15 PM 04/30/2021    2:28 PM  PHQ 2/9 Scores  PHQ - 2 Score 0 0 '1 1 2 6 '$ 0  PHQ- 9 Score 0 '2  9 6 13     '$ Fall Risk    03/28/2022   10:37 AM 12/30/2021   12:27 PM 11/12/2021    1:50 PM 08/06/2021    2:03 PM 04/30/2021    2:28 PM  Fall Risk   Falls in the past year? 0 0 0 1 1  Number falls in past yr: 0 0 0 1 0  Injury with Fall? 0 0 0 1 0  Risk for fall due to : No Fall Risks No Fall Risks Impaired mobility History of fall(s) History of fall(s)  Follow up Falls prevention discussed Falls evaluation completed;Education provided;Falls prevention discussed Falls evaluation completed Education provided Education provided    FALL RISK PREVENTION PERTAINING TO THE HOME:  Any stairs in or around the home? No  If so, are there any without handrails? No  Home free of loose throw rugs in walkways, pet beds, electrical cords, etc? Yes  Adequate lighting in your home to reduce risk of falls? Yes   ASSISTIVE  DEVICES UTILIZED TO PREVENT FALLS:  Life alert? Yes  Use of a cane, walker or w/c? Yes  Grab bars in the bathroom? Yes  Shower chair or bench in shower? Yes  Elevated toilet seat or a handicapped toilet? Yes       06/16/2017    3:54 PM  MMSE - Mini Mental State Exam  Orientation to time 5  Orientation to Place 5  Registration 3  Attention/ Calculation 5  Recall 3  Language- name 2 objects 2  Language- repeat 1  Language- follow 3 step command 3  Language- read & follow direction 1  Write a sentence 1  Copy design 1  Total score 30        03/28/2022   10:41 AM 03/27/2021    1:28 PM 03/26/2020    1:07 PM 12/01/2018    1:56 PM  6CIT Screen  What Year? 0 points 0 points 0 points 0 points  What month? 0 points 0 points 0 points 0 points  What time? 0 points 0 points 0 points 0 points  Count back from 20 0 points 0 points 0 points 2 points  Months in reverse 0 points 0 points 0 points 2 points  Repeat phrase 0 points 4 points 2 points 4 points  Total Score 0 points 4 points 2 points 8 points    Immunizations Immunization History  Administered Date(s) Administered   Fluad Quad(high Dose 65+) 01/27/2019, 01/05/2020, 01/09/2021, 01/29/2022   Influenza Split 01/20/2011, 12/22/2011   Influenza, High Dose Seasonal PF 01/25/2014, 01/24/2015, 01/01/2016, 02/26/2018   Influenza-Unspecified 01/19/2013   Pneumococcal Conjugate-13 03/01/2015   Pneumococcal Polysaccharide-23 04/21/2009   Tdap 08/30/2019   Zoster Recombinat (Shingrix) 09/03/2016, 06/18/2017, 12/15/2017   Zoster, Live 10/20/2010    TDAP status: Up to date  Flu Vaccine status: Up to date  Pneumococcal vaccine status: Up to date  Covid-19 vaccine status: Completed vaccines  Qualifies for Shingles Vaccine? Yes   Zostavax completed Yes   Shingrix Completed?: Yes  Screening Tests Health Maintenance  Topic Date Due   COVID-19 Vaccine (1) Never done   Diabetic kidney  evaluation - Urine ACR  04/30/2022   FOOT  EXAM  04/30/2022   HEMOGLOBIN A1C  08/26/2022   OPHTHALMOLOGY EXAM  10/02/2022   Diabetic kidney evaluation - GFR measurement  02/26/2023   Medicare Annual Wellness (AWV)  03/29/2023   DTaP/Tdap/Td (2 - Td or Tdap) 08/29/2029   Pneumonia Vaccine 32+ Years old  Completed   INFLUENZA VACCINE  Completed   DEXA SCAN  Completed   Zoster Vaccines- Shingrix  Completed   HPV VACCINES  Aged Out    Health Maintenance  Health Maintenance Due  Topic Date Due   COVID-19 Vaccine (1) Never done    Colorectal cancer screening: No longer required.   Mammogram status: No longer required due to age.  Bone Density status: Completed 08/17/2014. Results reflect: Bone density results: OSTEOPENIA. Repeat every 5 years.  Lung Cancer Screening: (Low Dose CT Chest recommended if Age 45-80 years, 30 pack-year currently smoking OR have quit w/in 15years.) does not qualify.   Lung Cancer Screening Referral: n/a  Additional Screening:  Hepatitis C Screening: does not qualify;  Vision Screening: Recommended annual ophthalmology exams for early detection of glaucoma and other disorders of the eye. Is the patient up to date with their annual eye exam?  Yes  Who is the provider or what is the name of the office in which the patient attends annual eye exams? Dr.johnson  If pt is not established with a provider, would they like to be referred to a provider to establish care? No .   Dental Screening: Recommended annual dental exams for proper oral hygiene  Community Resource Referral / Chronic Care Management: CRR required this visit?  No   CCM required this visit?  No      Plan:     I have personally reviewed and noted the following in the patient's chart:   Medical and social history Use of alcohol, tobacco or illicit drugs  Current medications and supplements including opioid prescriptions. Patient is not currently taking opioid prescriptions. Functional ability and status Nutritional  status Physical activity Advanced directives List of other physicians Hospitalizations, surgeries, and ER visits in previous 12 months Vitals Screenings to include cognitive, depression, and falls Referrals and appointments  In addition, I have reviewed and discussed with patient certain preventive protocols, quality metrics, and best practice recommendations. A written personalized care plan for preventive services as well as general preventive health recommendations were provided to patient.     Daphane Shepherd, LPN   [id number]   Nurse Notes: none

## 2022-04-08 ENCOUNTER — Ambulatory Visit: Payer: Medicare Other | Admitting: Cardiology

## 2022-04-08 ENCOUNTER — Other Ambulatory Visit: Payer: Self-pay | Admitting: Family

## 2022-04-08 DIAGNOSIS — I152 Hypertension secondary to endocrine disorders: Secondary | ICD-10-CM

## 2022-04-15 ENCOUNTER — Ambulatory Visit: Payer: Medicare Other | Admitting: Urology

## 2022-04-18 NOTE — Progress Notes (Signed)
Remote pacemaker transmission.   

## 2022-04-22 ENCOUNTER — Ambulatory Visit: Payer: Medicare Other | Admitting: Urology

## 2022-04-27 DIAGNOSIS — I509 Heart failure, unspecified: Secondary | ICD-10-CM | POA: Diagnosis not present

## 2022-04-27 DIAGNOSIS — J8410 Pulmonary fibrosis, unspecified: Secondary | ICD-10-CM | POA: Diagnosis not present

## 2022-04-27 DIAGNOSIS — J449 Chronic obstructive pulmonary disease, unspecified: Secondary | ICD-10-CM | POA: Diagnosis not present

## 2022-05-01 ENCOUNTER — Other Ambulatory Visit: Payer: Self-pay | Admitting: Family

## 2022-05-01 DIAGNOSIS — K219 Gastro-esophageal reflux disease without esophagitis: Secondary | ICD-10-CM

## 2022-05-13 ENCOUNTER — Ambulatory Visit: Payer: 59 | Admitting: Urology

## 2022-05-26 ENCOUNTER — Encounter: Payer: Self-pay | Admitting: Physician Assistant

## 2022-05-26 NOTE — Progress Notes (Unsigned)
Cardiology Office Note    Date:  05/27/2022   ID:  Lorraine Leonard, DOB 23-Mar-1941, MRN [id number]  PCP:  Sharion Balloon, FNP  Cardiologist:  Carlyle Dolly, MD  Electrophysiologist:  Cristopher Peru, MD   Chief Complaint: f/u CHF, afib  History of Present Illness:   Lorraine Leonard is a 82 y.o. female with history of chronic HFpEF, pulmonary fibrosis and COPD followed by pulmonology on O2 PRN, permanent atrial fib with prior difficulty controlling rate s/p AVN ablation and MDT PPM followed by EP, HTN, obesity, depression, glaucoma, hypothyroidism, CKD 3a by labs, solitary kidney (kidney donor), macular degeneration, suspected OSA (declined polysomnogram per notes) who is seen for follow-up. She is followed by Dr. Harl Bowie and Dr. Lovena Le. Last echo 01/2017 EF 55-60%, mod LVH, low normal RVF. Mild TR.  She is seen back for follow-up today with her granddaughter. She reports overall she has been doing OK but has had a few issues: - transient epistaxis 2x a few weeks ago, first started after dislodging a booger. Both episodes stopped without needing medical intervention. Still feels like there might be a booger adhered to that area but obviously does not want to dislodge it again - sense of congestion in her chest x 2-3 weeks, sometimes increased SOB at night (have to breathe with mouth open), weight relatively stable - did not yet take Lasix today since she was going to be out and about but otherwise adherent to regimen. Chronic mild R ankle edema reported to be unchanged. + Uptick in cough with this. Does seem to be a little better the last few days. No other infective symptoms - generalized fatigue - abdominal discomfort at times  Fortunately no chest pain or syncope. No other pathologic bleeding reported. She is hard of hearing.   Labwork independently reviewed: 02/2022 PCP TSH wnl, LDL 64, trig 216, Hgb 12.0, plt 286, Cr 1.21 GFR 45, LFTs ok, A1C 6.8   Past History   Past Medical History:   Diagnosis Date   Allergic rhinitis    Anxiety    Anxiety    Arthritis    "~ all my joints" (05/18/2017)   Atrial fibrillation ([place]) 09/2016   Atrial fibrillation with RVR ([place]) 05/18/2017   Chronic heart failure with preserved ejection fraction (HFpEF) (HCC)    Chronic kidney disease, stage 3a (HCC)    Chronic lower back pain    COPD (chronic obstructive pulmonary disease) (HCC)    Depression    Diverticulosis    GERD (gastroesophageal reflux disease)    Glaucoma    Gout    History of blood transfusion 1983   "when I gave a 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"   Hyperlipidemia    Hypertension    Hypothyroidism    Macular degeneration of both eyes    Neuropathy    On home oxygen therapy    "3L; 24/7" (05/18/2017)   Pre-diabetes    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   Pulmonary fibrosis (HCC)    Rheumatoid arthritis (HCC)    Sleep apnea    uses 3 liters of o2 24/7   Urticaria     Past Surgical History:  Procedure Laterality Date   AV NODE ABLATION  05/18/2017   AV NODE ABLATION N/A 05/18/2017   Procedure: AV NODE ABLATION;  Surgeon: Evans Lance, MD;  Location: [facility];  Service: Cardiovascular;  Laterality: N/A;   BACK SURGERY     BREAST LUMPECTOMY Left  CARDIOVERSION N/A 01/30/2017   Procedure: CARDIOVERSION;  Surgeon: Josue Hector, MD;  [facility];  Service: Cardiovascular;  Laterality: N/A;   CARDIOVERSION N/A 04/09/2017   Procedure: CARDIOVERSION;  Surgeon: Satira Sark, MD;  [facility];  Service: Cardiovascular;  Laterality: N/A;   CATARACT EXTRACTION W/ INTRAOCULAR LENS  IMPLANT, BILATERAL Bilateral    DILATION AND CURETTAGE OF UTERUS     EUS N/A 10/03/2015   Procedure: UPPER ENDOSCOPIC ULTRASOUND (EUS) RADIAL;  Surgeon: Arta Silence, MD;  [facility];  Service: Endoscopy;  Laterality: N/A;   FINE NEEDLE ASPIRATION N/A 10/03/2015   Procedure: FINE NEEDLE ASPIRATION (FNA) RADIAL;  Surgeon: Arta Silence, MD;  [facility];  Service: Endoscopy;  Laterality: N/A;   INSERT / REPLACE / REMOVE PACEMAKER  05/18/2017   LUMBAR LAMINECTOMY  1995; 06/23/2008   L4-5/notes 08/20/2010   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   NEPHRECTOMY TRANSPLANTED ORGAN     PACEMAKER IMPLANT N/A 05/18/2017   Procedure: PACEMAKER IMPLANT;  Surgeon: Evans Lance, MD;  Location: [facility];  Service: Cardiovascular;  Laterality: N/A;   TOTAL ABDOMINAL HYSTERECTOMY     TUBAL LIGATION      Current Medications: Current Meds  Medication Sig   albuterol (VENTOLIN HFA) 108 (90 Base) MCG/ACT inhaler INHALE 2 PUFFS INTO THE LUNGS EVERY 6 HOURS AS NEEDED FOR WHEEZE OR SHORTNESS OF BREATH   atenolol (TENORMIN) 25 MG tablet TAKE 1 TABLET (25 MG TOTAL) BY MOUTH DAILY.   calcium citrate (CALCITRATE - DOSED IN MG ELEMENTAL CALCIUM) 950 MG tablet Take 1 tablet by mouth 2 (two) times daily.    cyanocobalamin 1000 MCG tablet Take 1,000 mcg by mouth daily. Vitamin b12 every AM   diclofenac Sodium (VOLTAREN) 1 % GEL APPLY 4 GRAMS TOPICALLY 4 TIMES A DAY   ELIQUIS 5 MG TABS tablet TAKE 1 TABLET BY MOUTH TWICE A DAY   estradiol (ESTRACE VAGINAL) 0.1 MG/GM vaginal cream Place 1 Applicatorful vaginally at bedtime.   fluticasone (FLONASE) 50 MCG/ACT nasal spray PLACE 1 SPRAY INTO BOTH NOSTRILS 2 (TWO) TIMES DAILY AS NEEDED FOR ALLERGIES OR RHINITIS.   furosemide (LASIX) 40 MG tablet TAKE 1 TABLET BY MOUTH EVERY DAY   levocetirizine (XYZAL) 5 MG tablet TAKE 1 TABLET BY MOUTH EVERY DAY IN THE EVENING   levothyroxine (SYNTHROID) 100 MCG tablet Take 1 tablet (100 mcg total) by mouth daily.   metFORMIN (GLUCOPHAGE-XR) 500 MG 24 hr tablet TAKE 1 TABLET BY MOUTH EVERY DAY WITH BREAKFAST   Multiple Vitamins-Minerals (PRESERVISION AREDS 2) CAPS Take 1 capsule by mouth 2 (two) times daily.   Olopatadine HCl 0.2 % SOLN INSTILL 1 DROP INTO BOTH EYES EVERY DAY   omeprazole (PRILOSEC) 20 MG capsule TAKE 1  CAPSULE BY MOUTH EVERY DAY   ondansetron (ZOFRAN ODT) 4 MG disintegrating tablet Take 1 tablet (4 mg total) by mouth every 8 (eight) hours as needed for nausea or vomiting.   OXYGEN Inhale 3 L into the lungs every evening.   Polyethyl Glycol-Propyl Glycol (SYSTANE) 0.4-0.3 % GEL ophthalmic gel Place 1 application into both eyes at bedtime as needed (dry eyes).   predniSONE (DELTASONE) 5 MG tablet Take 1 tablet (5 mg total) by mouth 2 (two) times daily with a meal.   rosuvastatin (CRESTOR) 10 MG tablet TAKE 1 TABLET BY MOUTH EVERYDAY AT BEDTIME   spironolactone (ALDACTONE) 25 MG tablet TAKE 1 TABLET (25 MG TOTAL)  BY MOUTH DAILY.   vitamin C (ASCORBIC ACID) 500 MG tablet Take 1,000 mg by mouth daily. AM   Vitamin D, Ergocalciferol, (DRISDOL) 1.25 MG (50000 UNIT) CAPS capsule TAKE 1 CAPSULE (50,000 UNITS TOTAL) BY MOUTH EVERY 7 (SEVEN) DAYS     Allergies:   Allopurinol, Mobic [meloxicam], and Macrodantin [nitrofurantoin macrocrystal]   Social History   Socioeconomic History   Marital status: Widowed    Spouse name: Winslow Verrill   Number of children: 4   Years of education: 9   Highest education level: 9th grade  Occupational History   Occupation: retired    Comment: cna  Tobacco Use   Smoking status: Never    Passive exposure: Never   Smokeless tobacco: Never  Vaping Use   Vaping Use: Never used  Substance and Sexual Activity   Alcohol use: No   Drug use: No   Sexual activity: Not Currently    Partners: Male    Birth control/protection: Surgical  Other Topics Concern   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   Pt lives alone in single wide trailer. They have 4 children and 13 grandchildren.    Social Determinants of Health   Financial Resource Strain: Low Risk  (03/28/2022)   Overall Financial Resource Strain (CARDIA)    Difficulty of Paying Living Expenses: Not hard at all  Food Insecurity: No Food  Insecurity (03/28/2022)   Hunger Vital Sign    Worried About Running Out of Food in the Last Year: Never true    Ran Out of Food in the Last Year: Never true  Transportation Needs: No Transportation Needs (03/28/2022)   PRAPARE - Hydrologist (Medical): No    Lack of Transportation (Non-Medical): No  Physical Activity: Insufficiently Active (03/28/2022)   Exercise Vital Sign    Days of Exercise per Week: 3 days    Minutes of Exercise per Session: 30 min  Stress: No Stress Concern Present (03/28/2022)   [place]    Feeling of Stress : Not at all  Social Connections: Moderately Isolated (03/28/2022)   Social Connection and Isolation Panel [NHANES]    Frequency of Communication with Friends and Family: More than three times a week    Frequency of Social Gatherings with Friends and Family: More than three times a week    Attends Religious Services: 1 to 4 times per year    Active Member of [employer] or Organizations: No    Attends Archivist Meetings: Never    Marital Status: Widowed     Family History:  The patient's family history includes Anxiety disorder in an other family member; Asthma in an other family member; CVA in her son; Cancer in her sister, sister, sister, and sister; Coronary artery disease in an other family member; Diabetes in an other family member; Healthy in her brother and sister; Heart failure in her father; Hyperlipidemia in an other family member; Hypertension in an other family member; Kidney disease in her sister; Lung cancer in her sister and sister; Migraines in an other family member; Stroke in an other family member; Suicidality in her son.  ROS:   Please see the history of present illness. All other systems are reviewed and otherwise negative.    EKG(s)/Additional Testing   EKG:  EKG is ordered today, personally reviewed, demonstrating atrial fibrillation  89bpm, with periodic V pacing, otherwise nonacute, no significant change from prior  CV Studies: Cardiac studies reviewed are outlined and summarized above. Otherwise please see EMR for full report.  Recent Labs: 02/25/2022: ALT 18; BUN 26; Creatinine, Ser 1.21; Hemoglobin 12.0; Platelets 286; Potassium 4.5; Sodium 140; TSH 0.914  Recent Lipid Panel    Component Value Date/Time   CHOL 146 02/25/2022 1508   TRIG 216 (H) 02/25/2022 1508   TRIG 169 (H) 08/17/2014 1136   HDL 47 02/25/2022 1508   HDL 44 08/17/2014 1136   CHOLHDL 3.1 02/25/2022 1508   CHOLHDL 3.1 02/13/2017 0430   VLDL 24 02/13/2017 0430   LDLCALC 64 02/25/2022 1508    PHYSICAL EXAM:    VS:  BP 138/68   Pulse 94   Ht '5\' 7"'$  (1.702 m)   Wt 207 lb (93.9 kg)   SpO2 95%   BMI 32.42 kg/m   BMI: Body mass index is 32.42 kg/m.  GEN: Well nourished, well developed female in no acute distress HEENT: normocephalic, atraumatic Neck: no JVD, carotid bruits, or masses Cardiac: RRR; no murmurs, rubs, or gallops, right>left ankle edema which pt states is chronic Respiratory:  bilaterally crackles diffusely towards the bases, no rhonchi or wheezing, normal work of breathing GI: soft, nontender, nondistended, + BS MS: no deformity or atrophy Skin: warm and dry, no rash Neuro:  Alert and Oriented x 3, Strength and sensation are intact, follows commands Psych: euthymic mood, full affect  Wt Readings from Last 3 Encounters:  05/27/22 207 lb (93.9 kg)  03/28/22 205 lb (93 kg)  02/25/22 206 lb 9.6 oz (93.7 kg)     ASSESSMENT & PLAN:   1. Chronic HFpEF, cough - overall I get the sense she is doing alright. However, she does report new congestion for the last 2-3 weeks. Weight is 1-2 lb above recent values but in line going back the last year. Her ankle edema is reported to be chronic. Lungs sound fibrotic. We will obtain BMET, CBC, BNP and 2V CXR. If there is any evidence of fluid overload we may titrate her Lasix temporarily but  if unrevealing from cardiac standpoint, would encourage f/u with pulmonology. Per our discussion with her other symptoms of fatigue we'll update echo as well.  2. Permanent atrial fibrillation, acquired thrombophilia - rate controlled, followed by Dr. Lovena Le with last OV 01/2022 continued on atenolol and Eliquis. Atenolol is less favorable in renal disease but since patient has done well on this otherwise, will defer to EP for any additional changes if needed. She has had some epistaxis a few weeks ago related to disrupting a booger and still feels like it might be adherent to the wall of her nose. We'll refer to ENT for evaluation.  3. Essential HTN with CKD stage 3a - SBP slightly above goal but did not take Lasix today. Would follow clinically with plan above and re-evaluate at follow-up visit.  4. Tricuspid regurgitation - per shared decision making, pursuing updated echo as above.   Disposition: F/u with Dr. Harl Bowie or APP in 2 months.   Medication Adjustments/Labs and Tests Ordered: Current medicines are reviewed at length with the patient today.  Concerns regarding medicines are outlined above. Medication changes, Labs and Tests ordered today are summarized above and listed in the Patient Instructions accessible in Encounters.    Signed, Charlie Pitter, PA-C  05/27/2022 3:22 PM    [facility] in [place]  [place], [place] [address] Ph: [phone]; Fax [phone]

## 2022-05-27 ENCOUNTER — Ambulatory Visit (INDEPENDENT_AMBULATORY_CARE_PROVIDER_SITE_OTHER): Payer: 59 | Admitting: Physician Assistant

## 2022-05-27 ENCOUNTER — Encounter: Payer: Self-pay | Admitting: Physician Assistant

## 2022-05-27 ENCOUNTER — Ambulatory Visit
Admission: RE | Admit: 2022-05-27 | Discharge: 2022-05-27 | Disposition: A | Discharge status: Home / Self Care | Payer: 59 | Source: Ambulatory Visit | Attending: Physician Assistant | Admitting: Physician Assistant

## 2022-05-27 VITALS — BP 138/68 | HR 94 | Ht 67.0 in | Wt 207.0 lb

## 2022-05-27 DIAGNOSIS — I5032 Chronic diastolic (congestive) heart failure: Secondary | ICD-10-CM | POA: Insufficient documentation

## 2022-05-27 DIAGNOSIS — I10 Essential (primary) hypertension: Secondary | ICD-10-CM

## 2022-05-27 DIAGNOSIS — R040 Epistaxis: Secondary | ICD-10-CM | POA: Diagnosis not present

## 2022-05-27 DIAGNOSIS — I071 Rheumatic tricuspid insufficiency: Secondary | ICD-10-CM | POA: Insufficient documentation

## 2022-05-27 DIAGNOSIS — R059 Cough, unspecified: Secondary | ICD-10-CM

## 2022-05-27 DIAGNOSIS — D6869 Other thrombophilia: Secondary | ICD-10-CM

## 2022-05-27 DIAGNOSIS — I4821 Permanent atrial fibrillation: Secondary | ICD-10-CM

## 2022-05-27 DIAGNOSIS — N1831 Chronic kidney disease, stage 3a: Secondary | ICD-10-CM

## 2022-05-27 NOTE — Patient Instructions (Addendum)
Medication Instructions:  Your physician recommends that you continue on your current medications as directed. Please refer to the Current Medication list given to you today.   Labwork: cbc,bmet,bnp today  Testing/Procedures: Chest X-ray today  Schedule echocardiogram  Follow-Up: 2 months  Any Other Special Instructions Will Be Listed Below (If Applicable).   Referred to ENT, they will call you to schedula appointment.  If you need a refill on your cardiac medications before your next appointment, please call your pharmacy.

## 2022-05-28 DIAGNOSIS — J449 Chronic obstructive pulmonary disease, unspecified: Secondary | ICD-10-CM | POA: Diagnosis not present

## 2022-05-28 DIAGNOSIS — I509 Heart failure, unspecified: Secondary | ICD-10-CM | POA: Diagnosis not present

## 2022-05-28 DIAGNOSIS — J8410 Pulmonary fibrosis, unspecified: Secondary | ICD-10-CM | POA: Diagnosis not present

## 2022-05-30 ENCOUNTER — Telehealth: Payer: Self-pay

## 2022-05-30 NOTE — Telephone Encounter (Signed)
Patient notified and verbalized understanding. Patient stated she would have labs completed.

## 2022-05-30 NOTE — Telephone Encounter (Signed)
-----   Message from Charlie Pitter, [place] sent at 05/30/2022  9:00 AM EST ----- Let pt know cxr shows only some scarring, no acute fluid or infection. Had labs ordered at recent [place] which have not yet resulted so remind her to get these done if not in process. Thank you!

## 2022-06-03 ENCOUNTER — Telehealth (INDEPENDENT_AMBULATORY_CARE_PROVIDER_SITE_OTHER): Payer: 59 | Admitting: Family

## 2022-06-03 ENCOUNTER — Telehealth: Payer: Self-pay | Admitting: Family

## 2022-06-03 ENCOUNTER — Encounter: Payer: Self-pay | Admitting: Family

## 2022-06-03 DIAGNOSIS — R399 Unspecified symptoms and signs involving the genitourinary system: Secondary | ICD-10-CM | POA: Diagnosis not present

## 2022-06-03 LAB — URINALYSIS, COMPLETE
Bilirubin, UA: NEGATIVE
Ketones, UA: NEGATIVE
Nitrite, UA: POSITIVE — AB
Protein,UA: NEGATIVE
RBC, UA: NEGATIVE
Specific Gravity, UA: <1.005 — ABNORMAL LOW (ref 1.005–1.030)
Urobilinogen, Ur: 1 mg/dL (ref 0.2–1.0)
pH, UA: 5 (ref 5.0–7.5)

## 2022-06-03 LAB — MICROSCOPIC EXAMINATION
RBC, Urine: NONE SEEN /hpf (ref 0–2)
Renal Epithel, UA: NONE SEEN /hpf
Yeast, UA: NONE SEEN

## 2022-06-03 MED ORDER — CEPHALEXIN 500 MG PO CAPS: 500.0000 mg | ORAL_CAPSULE | Freq: Two times a day (BID) | ORAL | 0 refills | Status: DC

## 2022-06-03 NOTE — Progress Notes (Signed)
   Virtual Visit  Note Due to COVID-19 pandemic this visit was conducted virtually. This visit type was conducted due to national recommendations for restrictions regarding the COVID-19 Pandemic (e.g. social distancing, sheltering in place) in an effort to limit this patient's exposure and mitigate transmission in our community. All issues noted in this document were discussed and addressed.  A physical exam was not performed with this format.  I connected with [name] on 06/03/22 at 3:38 pm  by telephone and verified that I am speaking with the correct person using two identifiers. Lorraine Leonard is currently located at home and  no one is currently with her during visit. The provider, Evelina Dun, FNP is located in their office at time of visit.  I discussed the limitations, risks, security and privacy concerns of performing an evaluation and management service by telephone and the availability of in person appointments. I also discussed with the patient that there may be a patient responsible charge related to this service. The patient expressed understanding and agreed to proceed.   History and Present Illness:  PT calls the office today with dysuria that started yesterday.  Dysuria  This is a new problem. The current episode started yesterday. The problem occurs intermittently. The problem has been waxing and waning. The quality of the pain is described as burning. The pain is at a severity of 5/10. The pain is mild. Associated symptoms include frequency, urgency and vomiting. Pertinent negatives include no hematuria or nausea. She has tried increased fluids for the symptoms. The treatment provided mild relief.      Review of Systems  Gastrointestinal:  Positive for vomiting. Negative for nausea.  Genitourinary:  Positive for dysuria, frequency and urgency. Negative for hematuria.     Observations/Objective: No SOB or distress noted  Assessment and Plan: 1. UTI symptoms Force  fluids AZO over the counter X2 days Follow up if symptoms worsen or do not improve  Culture pending  - Urinalysis, Complete - Urine Culture - cephALEXin (KEFLEX) 500 MG capsule; Take 1 capsule (500 mg total) by mouth 2 (two) times daily.  Dispense: 14 capsule; Refill: 0     I discussed the assessment and treatment plan with the patient. The patient was provided an opportunity to ask questions and all were answered. The patient agreed with the plan and demonstrated an understanding of the instructions.   The patient was advised to call back or seek an in-person evaluation if the symptoms worsen or if the condition fails to improve as anticipated.  The above assessment and management plan was discussed with the patient. The patient verbalized understanding of and has agreed to the management plan. Patient is aware to call the clinic if symptoms persist or worsen. Patient is aware when to return to the clinic for a follow-up visit. Patient educated on when it is appropriate to go to the emergency department.   Time call ended:  3:50 pm   I provided 12 minutes of  non face-to-face time during this encounter.    Evelina Dun, FNP

## 2022-06-03 NOTE — Telephone Encounter (Signed)
Appt made

## 2022-06-04 ENCOUNTER — Other Ambulatory Visit: Payer: Self-pay | Admitting: Family

## 2022-06-10 ENCOUNTER — Other Ambulatory Visit: Payer: Self-pay | Admitting: Family

## 2022-06-10 LAB — URINE CULTURE

## 2022-06-10 MED ORDER — CIPROFLOXACIN HCL 500 MG PO TABS: 500.0000 mg | ORAL_TABLET | Freq: Two times a day (BID) | ORAL | 0 refills | Status: DC

## 2022-06-11 ENCOUNTER — Other Ambulatory Visit: Payer: Self-pay | Admitting: Family

## 2022-06-11 DIAGNOSIS — E1159 Type 2 diabetes mellitus with other circulatory complications: Secondary | ICD-10-CM

## 2022-06-23 ENCOUNTER — Ambulatory Visit (HOSPITAL_COMMUNITY): Admission: RE | Admit: 2022-06-23 | Payer: 59 | Source: Ambulatory Visit

## 2022-06-24 ENCOUNTER — Ambulatory Visit: Payer: 59 | Admitting: Urology

## 2022-06-24 ENCOUNTER — Ambulatory Visit (INDEPENDENT_AMBULATORY_CARE_PROVIDER_SITE_OTHER): Payer: 59

## 2022-06-24 DIAGNOSIS — I442 Atrioventricular block, complete: Secondary | ICD-10-CM | POA: Diagnosis not present

## 2022-06-24 LAB — CUP PACEART REMOTE DEVICE CHECK
Battery Remaining Longevity: 44 mo
Battery Voltage: 2.87 V
Brady Statistic AP VP Percent: 3.97 %
Brady Statistic AP VS Percent: 70.59 %
Brady Statistic AS VP Percent: 0 %
Brady Statistic AS VS Percent: 25.44 %
Brady Statistic RA Percent Paced: 97.54 %
Brady Statistic RV Percent Paced: 3.97 %
Date Time Interrogation Session: 20240305022209
Implantable Lead Connection Status: 753985
Implantable Lead Connection Status: 753985
Implantable Lead Implant Date: 20190128
Implantable Lead Implant Date: 20190128
Implantable Lead Location: 753860
Implantable Lead Location: 753860
Implantable Lead Model: 3830
Implantable Lead Model: 5076
Implantable Pulse Generator Implant Date: 20190128
Lead Channel Impedance Value: 323 Ohm
Lead Channel Impedance Value: 342 Ohm
Lead Channel Impedance Value: 399 Ohm
Lead Channel Impedance Value: 399 Ohm
Lead Channel Sensing Intrinsic Amplitude: 3 mV
Lead Channel Sensing Intrinsic Amplitude: 3 mV
Lead Channel Sensing Intrinsic Amplitude: 8.5 mV
Lead Channel Sensing Intrinsic Amplitude: 8.5 mV
Lead Channel Setting Pacing Amplitude: 2.5 V
Lead Channel Setting Pacing Amplitude: 2.5 V
Lead Channel Setting Pacing Pulse Width: 0.4 ms
Lead Channel Setting Sensing Sensitivity: 0.9 mV
Zone Setting Status: 755011
Zone Setting Status: 755011

## 2022-06-26 DIAGNOSIS — I509 Heart failure, unspecified: Secondary | ICD-10-CM | POA: Diagnosis not present

## 2022-06-26 DIAGNOSIS — J8410 Pulmonary fibrosis, unspecified: Secondary | ICD-10-CM | POA: Diagnosis not present

## 2022-06-26 DIAGNOSIS — J449 Chronic obstructive pulmonary disease, unspecified: Secondary | ICD-10-CM | POA: Diagnosis not present

## 2022-07-02 ENCOUNTER — Other Ambulatory Visit: Payer: Self-pay | Admitting: Family

## 2022-07-02 DIAGNOSIS — J449 Chronic obstructive pulmonary disease, unspecified: Secondary | ICD-10-CM

## 2022-07-04 ENCOUNTER — Other Ambulatory Visit: Payer: Self-pay | Admitting: Family

## 2022-07-04 DIAGNOSIS — J019 Acute sinusitis, unspecified: Secondary | ICD-10-CM

## 2022-07-05 ENCOUNTER — Other Ambulatory Visit: Payer: Self-pay | Admitting: Family

## 2022-07-05 DIAGNOSIS — E1159 Type 2 diabetes mellitus with other circulatory complications: Secondary | ICD-10-CM

## 2022-07-05 DIAGNOSIS — K219 Gastro-esophageal reflux disease without esophagitis: Secondary | ICD-10-CM

## 2022-07-05 DIAGNOSIS — E559 Vitamin D deficiency, unspecified: Secondary | ICD-10-CM

## 2022-07-07 ENCOUNTER — Other Ambulatory Visit: Payer: Self-pay | Admitting: Family

## 2022-07-07 NOTE — Telephone Encounter (Signed)
Appt 4-12 with [facility]

## 2022-07-14 NOTE — Progress Notes (Signed)
History of Present Illness: Lorraine Leonard is a 82 y.o. year old female here for follow-up of recurrent cystitis.  Last seen in December 2022.  At that time she had not been on estrogen cream where her methenamine used for suppression.  She was given prescriptions of both of those at that time.  She is only had 1 UTI treated since that time-about a month ago.  She rarely uses estrogen cream.  No worrisome lower urinary tract issues.  Past Medical History:  Diagnosis Date   Allergic rhinitis    Anxiety    Anxiety    Arthritis    "~ all my joints" (05/18/2017)   Atrial fibrillation ([place]) 09/2016   Atrial fibrillation with RVR (HCC) 05/18/2017   Chronic heart failure with preserved ejection fraction (HFpEF) (HCC)    Chronic kidney disease, stage 3a (HCC)    Chronic lower back pain    COPD (chronic obstructive pulmonary disease) (HCC)    Depression    Diverticulosis    GERD (gastroesophageal reflux disease)    Glaucoma    Gout    History of blood transfusion 1983   "when I gave a 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"   Hyperlipidemia    Hypertension    Hypothyroidism    Macular degeneration of both eyes    Neuropathy    On home oxygen therapy    "3L; 24/7" (05/18/2017)   Pre-diabetes    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   Pulmonary fibrosis (HCC)    Rheumatoid arthritis ([place])    Sleep apnea    uses 3 liters of o2 24/7   Urticaria     Past Surgical History:  Procedure Laterality Date   AV NODE ABLATION  05/18/2017   AV NODE ABLATION N/A 05/18/2017   Procedure: AV NODE ABLATION;  Surgeon: Evans Lance, MD;  Location: [facility];  Service: Cardiovascular;  Laterality: N/A;   BACK SURGERY     BREAST LUMPECTOMY Left    CARDIOVERSION N/A 01/30/2017   Procedure: CARDIOVERSION;  Surgeon: Josue Hector, MD;  [facility];  Service: Cardiovascular;  Laterality: N/A;   CARDIOVERSION N/A 04/09/2017   Procedure: CARDIOVERSION;  Surgeon: Satira Sark, MD;   [facility];  Service: Cardiovascular;  Laterality: N/A;   CATARACT EXTRACTION W/ INTRAOCULAR LENS  IMPLANT, BILATERAL Bilateral    DILATION AND CURETTAGE OF UTERUS     EUS N/A 10/03/2015   Procedure: UPPER ENDOSCOPIC ULTRASOUND (EUS) RADIAL;  Surgeon: Arta Silence, MD;  [facility];  Service: Endoscopy;  Laterality: N/A;   FINE NEEDLE ASPIRATION N/A 10/03/2015   Procedure: FINE NEEDLE ASPIRATION (FNA) RADIAL;  Surgeon: Arta Silence, MD;  [facility];  Service: Endoscopy;  Laterality: N/A;   INSERT / REPLACE / REMOVE PACEMAKER  05/18/2017   LUMBAR LAMINECTOMY  1995; 06/23/2008   L4-5/notes 08/20/2010   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   NEPHRECTOMY TRANSPLANTED ORGAN     PACEMAKER IMPLANT N/A 05/18/2017   Procedure: PACEMAKER IMPLANT;  Surgeon: Evans Lance, MD;  Location: [facility];  Service: Cardiovascular;  Laterality: N/A;   TOTAL ABDOMINAL HYSTERECTOMY     TUBAL LIGATION      Home Medications:  (Not in a hospital admission)   Allergies:  Allergies  Allergen Reactions   Allopurinol Swelling    Feet and legs swell   Mobic [Meloxicam] Swelling and Other (See Comments)  Feet and Leg swelling   Macrodantin [Nitrofurantoin Macrocrystal]     Family History  Problem Relation Age of Onset   Diabetes Other    Hypertension Other    Coronary artery disease Other    Stroke Other    Asthma Other    Kidney disease Sister    Cancer Sister        lung   Lung cancer Sister    Cancer Sister        lung   Lung cancer Sister    Cancer Sister        lung   Suicidality Son        committed suicide in 2009   CVA Son    Hyperlipidemia Other    Anxiety disorder Other    Migraines Other    Heart failure Father    Healthy Brother    Cancer Sister        lung   Healthy Sister     Social History:  reports that she has never smoked. She has never been exposed to tobacco smoke. She has never used smokeless  tobacco. She reports that she does not drink alcohol and does not use drugs.  ROS: A complete review of systems was performed.  All systems are negative except for pertinent findings as noted.  Physical Exam:  Vital signs in last 24 hours: @VSRANGES @ General:  Alert and oriented, No acute distress HEENT: Normocephalic, atraumatic Neck: No JVD or lymphadenopathy Cardiovascular: Regular rate  Lungs: Normal inspiratory/expiratory excursion Abdomen: Soft, nontender, nondistended, no abdominal masses Back: No CVA tenderness Extremities: No edema Neurologic: Grossly intact  I have reviewed prior pt notes  I have reviewed notes from referring/previous physicians  I have reviewed urinalysis results-pyuria noted today  I have independently reviewed prior imaging  I have reviewed prior urine culture-prior cultures reviewed  Impression/Assessment:  Atrophic vaginal changes-on sporadic estrogen.  Vaginally  History of recurrent UTIs-overall doing well although urine today did show pyuria.  However, she was asymptomatic  Plan:  I think it is okay to take cranberry capsules rather than the methenamine-she does not have an active prescription for the latter  Recommended that she use estrogen more often, twice a week in the perivaginal area  I will see back in a year  [name] 07/14/2022, 8:42 PM  Lorraine Leonard. Maille Halliwell MD

## 2022-07-15 ENCOUNTER — Encounter: Payer: Self-pay | Admitting: Urology

## 2022-07-15 ENCOUNTER — Ambulatory Visit (INDEPENDENT_AMBULATORY_CARE_PROVIDER_SITE_OTHER): Payer: 59 | Admitting: Urology

## 2022-07-15 VITALS — BP 144/76 | HR 88 | Ht 67.0 in | Wt 207.0 lb

## 2022-07-15 DIAGNOSIS — N952 Postmenopausal atrophic vaginitis: Secondary | ICD-10-CM

## 2022-07-15 DIAGNOSIS — R8281 Pyuria: Secondary | ICD-10-CM | POA: Diagnosis not present

## 2022-07-15 DIAGNOSIS — Z87440 Personal history of urinary (tract) infections: Secondary | ICD-10-CM | POA: Diagnosis not present

## 2022-07-15 DIAGNOSIS — N390 Urinary tract infection, site not specified: Secondary | ICD-10-CM | POA: Diagnosis not present

## 2022-07-15 LAB — URINALYSIS, ROUTINE W REFLEX MICROSCOPIC
Bilirubin, UA: NEGATIVE
Glucose, UA: NEGATIVE
Ketones, UA: NEGATIVE
Nitrite, UA: POSITIVE — AB
Specific Gravity, UA: 1.02 (ref 1.005–1.030)
Urobilinogen, Ur: 0.2 mg/dL (ref 0.2–1.0)
pH, UA: 6 (ref 5.0–7.5)

## 2022-07-15 LAB — MICROSCOPIC EXAMINATION: WBC, UA: >30 /hpf — AB (ref 0–5)

## 2022-07-23 ENCOUNTER — Ambulatory Visit (HOSPITAL_COMMUNITY)
Admission: RE | Admit: 2022-07-23 | Discharge: 2022-07-23 | Disposition: A | Discharge status: Home / Self Care | Payer: 59 | Source: Ambulatory Visit | Attending: Physician Assistant | Admitting: Physician Assistant

## 2022-07-23 DIAGNOSIS — I071 Rheumatic tricuspid insufficiency: Secondary | ICD-10-CM | POA: Insufficient documentation

## 2022-07-23 DIAGNOSIS — I5032 Chronic diastolic (congestive) heart failure: Secondary | ICD-10-CM | POA: Diagnosis not present

## 2022-07-23 DIAGNOSIS — I361 Nonrheumatic tricuspid (valve) insufficiency: Secondary | ICD-10-CM

## 2022-07-23 LAB — ECHOCARDIOGRAM COMPLETE
Area-P 1/2: 3.6 cm2
P 1/2 time: 596 msec
S' Lateral: 3 cm

## 2022-07-23 NOTE — Progress Notes (Signed)
*  PRELIMINARY RESULTS* Echocardiogram 2D Echocardiogram has been performed.  Lorraine Leonard 07/23/2022, 1:45 PM

## 2022-07-27 DIAGNOSIS — J8410 Pulmonary fibrosis, unspecified: Secondary | ICD-10-CM | POA: Diagnosis not present

## 2022-07-27 DIAGNOSIS — J449 Chronic obstructive pulmonary disease, unspecified: Secondary | ICD-10-CM | POA: Diagnosis not present

## 2022-07-27 DIAGNOSIS — I509 Heart failure, unspecified: Secondary | ICD-10-CM | POA: Diagnosis not present

## 2022-07-30 NOTE — Progress Notes (Signed)
Remote pacemaker transmission.   

## 2022-08-01 ENCOUNTER — Ambulatory Visit: Payer: 59 | Admitting: Family

## 2022-08-04 ENCOUNTER — Ambulatory Visit: Payer: 59 | Admitting: Physician Assistant

## 2022-08-14 ENCOUNTER — Encounter: Payer: Self-pay | Admitting: Family

## 2022-08-14 ENCOUNTER — Ambulatory Visit (INDEPENDENT_AMBULATORY_CARE_PROVIDER_SITE_OTHER): Payer: 59 | Admitting: Family

## 2022-08-14 VITALS — BP 131/62 | HR 86 | Temp 97.4°F | Ht 67.0 in | Wt 204.0 lb

## 2022-08-14 DIAGNOSIS — I130 Hypertensive heart and chronic kidney disease with heart failure and stage 1 through stage 4 chronic kidney disease, or unspecified chronic kidney disease: Secondary | ICD-10-CM | POA: Diagnosis not present

## 2022-08-14 DIAGNOSIS — E039 Hypothyroidism, unspecified: Secondary | ICD-10-CM

## 2022-08-14 DIAGNOSIS — I5031 Acute diastolic (congestive) heart failure: Secondary | ICD-10-CM | POA: Diagnosis not present

## 2022-08-14 DIAGNOSIS — H6121 Impacted cerumen, right ear: Secondary | ICD-10-CM

## 2022-08-14 DIAGNOSIS — M159 Polyosteoarthritis, unspecified: Secondary | ICD-10-CM

## 2022-08-14 DIAGNOSIS — Z0001 Encounter for general adult medical examination with abnormal findings: Secondary | ICD-10-CM

## 2022-08-14 DIAGNOSIS — I708 Atherosclerosis of other arteries: Secondary | ICD-10-CM | POA: Diagnosis not present

## 2022-08-14 DIAGNOSIS — E1142 Type 2 diabetes mellitus with diabetic polyneuropathy: Secondary | ICD-10-CM | POA: Diagnosis not present

## 2022-08-14 DIAGNOSIS — I152 Hypertension secondary to endocrine disorders: Secondary | ICD-10-CM

## 2022-08-14 DIAGNOSIS — Z0000 Encounter for general adult medical examination without abnormal findings: Secondary | ICD-10-CM

## 2022-08-14 DIAGNOSIS — I4891 Unspecified atrial fibrillation: Secondary | ICD-10-CM | POA: Diagnosis not present

## 2022-08-14 DIAGNOSIS — I5033 Acute on chronic diastolic (congestive) heart failure: Secondary | ICD-10-CM | POA: Diagnosis not present

## 2022-08-14 DIAGNOSIS — E559 Vitamin D deficiency, unspecified: Secondary | ICD-10-CM

## 2022-08-14 DIAGNOSIS — E1169 Type 2 diabetes mellitus with other specified complication: Secondary | ICD-10-CM

## 2022-08-14 DIAGNOSIS — I700 Atherosclerosis of aorta: Secondary | ICD-10-CM | POA: Diagnosis not present

## 2022-08-14 DIAGNOSIS — N183 Chronic kidney disease, stage 3 unspecified: Secondary | ICD-10-CM | POA: Diagnosis not present

## 2022-08-14 DIAGNOSIS — F419 Anxiety disorder, unspecified: Secondary | ICD-10-CM | POA: Diagnosis not present

## 2022-08-14 DIAGNOSIS — E1159 Type 2 diabetes mellitus with other circulatory complications: Secondary | ICD-10-CM

## 2022-08-14 DIAGNOSIS — E669 Obesity, unspecified: Secondary | ICD-10-CM

## 2022-08-14 DIAGNOSIS — E785 Hyperlipidemia, unspecified: Secondary | ICD-10-CM

## 2022-08-14 DIAGNOSIS — F331 Major depressive disorder, recurrent, moderate: Secondary | ICD-10-CM

## 2022-08-14 DIAGNOSIS — I5032 Chronic diastolic (congestive) heart failure: Secondary | ICD-10-CM

## 2022-08-14 DIAGNOSIS — J8410 Pulmonary fibrosis, unspecified: Secondary | ICD-10-CM | POA: Diagnosis not present

## 2022-08-14 LAB — BAYER DCA HB A1C WAIVED: HB A1C (BAYER DCA - WAIVED): 7 % — ABNORMAL HIGH (ref 4.8–5.6)

## 2022-08-14 NOTE — Patient Instructions (Signed)
Health Maintenance After Age 82 After age 82, you are at a higher risk for certain long-term diseases and infections as well as injuries from falls. Falls are a major cause of broken bones and head injuries in people who are older than age 82. Getting regular preventive care can help to keep you healthy and well. Preventive care includes getting regular testing and making lifestyle changes as recommended by your health care provider. Talk with your health care provider about: Which screenings and tests you should have. A screening is a test that checks for a disease when you have no symptoms. A diet and exercise plan that is right for you. What should I know about screenings and tests to prevent falls? Screening and testing are the best ways to find a health problem early. Early diagnosis and treatment give you the best chance of managing medical conditions that are common after age 82. Certain conditions and lifestyle choices may make you more likely to have a fall. Your health care provider may recommend: Regular vision checks. Poor vision and conditions such as cataracts can make you more likely to have a fall. If you wear glasses, make sure to get your prescription updated if your vision changes. Medicine review. Work with your health care provider to regularly review all of the medicines you are taking, including over-the-counter medicines. Ask your health care provider about any side effects that may make you more likely to have a fall. Tell your health care provider if any medicines that you take make you feel dizzy or sleepy. Strength and balance checks. Your health care provider may recommend certain tests to check your strength and balance while standing, walking, or changing positions. Foot health exam. Foot pain and numbness, as well as not wearing proper footwear, can make you more likely to have a fall. Screenings, including: Osteoporosis screening. Osteoporosis is a condition that causes  the bones to get weaker and break more easily. Blood pressure screening. Blood pressure changes and medicines to control blood pressure can make you feel dizzy. Depression screening. You may be more likely to have a fall if you have a fear of falling, feel depressed, or feel unable to do activities that you used to do. Alcohol use screening. Using too much alcohol can affect your balance and may make you more likely to have a fall. Follow these instructions at home: Lifestyle Do not drink alcohol if: Your health care provider tells you not to drink. If you drink alcohol: Limit how much you have to: 0-1 drink a day for women. 0-2 drinks a day for men. Know how much alcohol is in your drink. In the [place] one drink equals one 12 oz bottle of beer (355 mL), one 5 oz glass of wine (148 mL), or one 1 oz glass of hard liquor (44 mL). Do not use any products that contain nicotine or tobacco. These products include cigarettes, chewing tobacco, and vaping devices, such as e-cigarettes. If you need help quitting, ask your health care provider. Activity  Follow a regular exercise program to stay fit. This will help you maintain your balance. Ask your health care provider what types of exercise are appropriate for you. If you need a cane or walker, use it as recommended by your health care provider. Wear supportive shoes that have nonskid soles. Safety  Remove any tripping hazards, such as rugs, cords, and clutter. Install safety equipment such as grab bars in bathrooms and safety rails on stairs. Keep rooms and walkways   well-lit. General instructions Talk with your health care provider about your risks for falling. Tell your health care provider if: You fall. Be sure to tell your health care provider about all falls, even ones that seem minor. You feel dizzy, tiredness (fatigue), or off-balance. Take over-the-counter and prescription medicines only as told by your health care provider. These include  supplements. Eat a healthy diet and maintain a healthy weight. A healthy diet includes low-fat dairy products, low-fat (lean) meats, and fiber from whole grains, beans, and lots of fruits and vegetables. Stay current with your vaccines. Schedule regular health, dental, and eye exams. Summary Having a healthy lifestyle and getting preventive care can help to protect your health and wellness after age 82. Screening and testing are the best way to find a health problem early and help you avoid having a fall. Early diagnosis and treatment give you the best chance for managing medical conditions that are more common for people who are older than age 82. Falls are a major cause of broken bones and head injuries in people who are older than age 82. Take precautions to prevent a fall at home. Work with your health care provider to learn what changes you can make to improve your health and wellness and to prevent falls. This information is not intended to replace advice given to you by your health care provider. Make sure you discuss any questions you have with your health care provider. Document Revised: 08/27/2020 Document Reviewed: 08/27/2020 Elsevier Patient Education  2023 [employer]  

## 2022-08-14 NOTE — Progress Notes (Signed)
Subjective:    Patient ID: Lorraine Leonard, female    DOB: 1941-04-10, 82 y.o.   MRN: [id number]  Chief Complaint  Patient presents with   Medical Management of Chronic Issues   Pt presents to the office today for CPE and chronic follow up.    She is followed by Cardiologists every 6 months for CHF & A Fib. She is followed by Pulmonologist annually for pulmonary hypertension, COPD, and Pulmonary Fibrosis. She was told to decrease her oxygen to as needed. Pt is not on oxygen today.  She reports her breathing is stable, but SOB with exertion.    She is followed by Nephrologists as needed for CKD. Avoids NSAID's.  She is followed by Urologists for recurrent UTI's annually.  Hypertension This is a chronic problem. The current episode started more than 1 year ago. The problem has been waxing and waning since onset. The problem is uncontrolled. Associated symptoms include anxiety, malaise/fatigue and shortness of breath. Pertinent negatives include no blurred vision or peripheral edema. Risk factors for coronary artery disease include dyslipidemia, diabetes mellitus, obesity and sedentary lifestyle. The current treatment provides moderate improvement. Hypertensive end-organ damage includes heart failure. Identifiable causes of hypertension include a thyroid problem.  Congestive Heart Failure Presents for follow-up visit. Associated symptoms include fatigue and shortness of breath. The symptoms have been stable.  Diabetes She presents for her follow-up diabetic visit. She has type 2 diabetes mellitus. Pertinent negatives for hypoglycemia include no nervousness/anxiousness. Associated symptoms include fatigue and foot paresthesias. Pertinent negatives for diabetes include no blurred vision. Symptoms are stable. 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 Risk factors for coronary artery disease include dyslipidemia, diabetes mellitus, hypertension, sedentary lifestyle and  post-menopausal. She is following a generally healthy diet. (Does not check BS at home)  Thyroid Problem Presents for follow-up visit. Symptoms include constipation and fatigue. Patient reports no anxiety, cold intolerance or diarrhea. The symptoms have been stable. Her past medical history is significant for heart failure and hyperlipidemia.  Hyperlipidemia This is a chronic problem. The current episode started more than 1 year ago. Exacerbating diseases include obesity. Associated symptoms include shortness of breath. Current antihyperlipidemic treatment includes statins. The current treatment provides mild improvement of lipids.  Arthritis Presents for follow-up visit. She complains of pain and stiffness. The symptoms have been stable. Affected locations include the left knee, right knee, right foot, left foot, left ankle and right ankle. Her pain is at a severity of 8/10. Associated symptoms include fatigue. Pertinent negatives include no diarrhea.  Anemia Presents for follow-up visit. Symptoms include malaise/fatigue. Past medical history includes heart failure.  Depression        This is a chronic problem.  The current episode started more than 1 year ago.   The onset quality is gradual.   The problem occurs intermittently.  Associated symptoms include fatigue, restlessness and sad.  Associated symptoms include no helplessness and no hopelessness.  Past medical history includes thyroid problem and anxiety.   Anxiety Presents for follow-up visit. Symptoms include excessive worry, restlessness and shortness of breath. Patient reports no nervous/anxious behavior. Symptoms occur occasionally. The severity of symptoms is moderate.   Her past medical history is significant for anemia.      Review of Systems  Constitutional:  Positive for fatigue and malaise/fatigue.  Eyes:  Negative for blurred vision.  Respiratory:  Positive for shortness of breath.   Gastrointestinal:  Positive for  constipation. Negative for diarrhea.  Endocrine: Negative for cold intolerance.  Musculoskeletal:  Positive for arthritis and stiffness.  Psychiatric/Behavioral:  Positive for depression. The patient is not nervous/anxious.   All other systems reviewed and are negative.      Objective:   Physical Exam Vitals reviewed.  Constitutional:      General: She is not in acute distress.    Appearance: She is well-developed. She is obese.  HENT:     Head: Normocephalic and atraumatic.     Right Ear: There is impacted cerumen.     Left Ear: Tympanic membrane normal.  Eyes:     Pupils: Pupils are equal, round, and reactive to light.  Neck:     Thyroid: No thyromegaly.  Cardiovascular:     Rate and Rhythm: Normal rate and regular rhythm.     Heart sounds: Murmur heard.  Pulmonary:     Effort: Pulmonary effort is normal. No respiratory distress.     Breath sounds: Wheezing present.     Comments: SOB Abdominal:     General: Bowel sounds are normal. There is no distension.     Palpations: Abdomen is soft.     Tenderness: There is no abdominal tenderness.  Musculoskeletal:        General: No tenderness. Normal range of motion.     Cervical back: Normal range of motion and neck supple.     Right lower leg: Edema (trace in ankle) present.     Left lower leg: Edema (trace in ankle) present.  Skin:    General: Skin is warm and dry.  Neurological:     Mental Status: She is alert and oriented to person, place, and time.     Cranial Nerves: No cranial nerve deficit.     Motor: Weakness present.     Gait: Gait abnormal.     Deep Tendon Reflexes: Reflexes are normal and symmetric.  Psychiatric:        Behavior: Behavior normal.        Thought Content: Thought content normal.        Judgment: Judgment normal.    Right ear washed with warm water and peroxide, TM WNL   BP 131/62   Pulse 86   Temp (!) 97.4 F (36.3 C) (Temporal)   Ht  (1.702 m)   Wt 204 lb (92.5 kg)   SpO2 97%    BMI 31.95 kg/m      Assessment & Plan:   BISMA KLETT comes in today with chief complaint of Medical Management of Chronic Issues   Diagnosis and orders addressed:  1. Anxiety - CMP14+EGFR - CBC with Differential/Platelet  2. Acute diastolic CHF (congestive heart failure) - CMP14+EGFR - CBC with Differential/Platelet  3. Aorto-iliac atherosclerosis - CMP14+EGFR - CBC with Differential/Platelet  4. Atrial fibrillation, unspecified type - CMP14+EGFR - CBC with Differential/Platelet  5. Chronic diastolic heart failure  - CMP14+EGFR - CBC with Differential/Platelet - Brain natriuretic peptide  6. Stage 3 chronic kidney disease, unspecified whether stage 3a or 3b CKD - CMP14+EGFR - CBC with Differential/Platelet  7. Moderate episode of recurrent major depressive disorder  - CMP14+EGFR - CBC with Differential/Platelet  8. Hyperlipidemia associated with type 2 diabetes mellitus - CMP14+EGFR - CBC with Differential/Platelet  9. Hypertension associated with diabetes  - CMP14+EGFR - CBC with Differential/Platelet  10. Hypothyroidism, unspecified type  - CMP14+EGFR - CBC with Differential/Platelet - TSH  11. Obesity (BMI 30-39.9)  - CMP14+EGFR - CBC with Differential/Platelet  12. Osteoarthritis of multiple joints, unspecified osteoarthritis type  - CMP14+EGFR -  CBC with Differential/Platelet  13. Pulmonary fibrosis  - CMP14+EGFR - CBC with Differential/Platelet  14. Type 2 diabetes mellitus with diabetic polyneuropathy, without long-term current use of insulin  - Bayer DCA Hb A1c Waived - CMP14+EGFR - Microalbumin / creatinine urine ratio - CBC with Differential/Platelet  15. Vitamin D deficiency - CMP14+EGFR - CBC with Differential/Platelet  16. Annual physical exam - Bayer DCA Hb A1c Waived - CMP14+EGFR - Microalbumin / creatinine urine ratio - CBC with Differential/Platelet - TSH  17. Impacted cerumen, right ear Use Debrox drops as  needed    Labs pending Health Maintenance reviewed Diet and exercise encouraged  Follow up plan: 3  months    Jannifer Rodney, FNP

## 2022-08-15 LAB — CBC WITH DIFFERENTIAL/PLATELET
Basophils Absolute: 0.1 10*3/uL (ref 0.0–0.2)
Basos: 0 %
EOS (ABSOLUTE): 0.1 10*3/uL (ref 0.0–0.4)
Eos: 0 %
Hematocrit: 39.9 % (ref 34.0–46.6)
Hemoglobin: 12.2 g/dL (ref 11.1–15.9)
Immature Grans (Abs): 0 10*3/uL (ref 0.0–0.1)
Immature Granulocytes: 0 %
Lymphocytes Absolute: 3.9 10*3/uL — ABNORMAL HIGH (ref 0.7–3.1)
Lymphs: 28 %
MCH: 25.5 pg — ABNORMAL LOW (ref 26.6–33.0)
MCHC: 30.6 g/dL — ABNORMAL LOW (ref 31.5–35.7)
MCV: 83 fL (ref 79–97)
Monocytes Absolute: 1.2 10*3/uL — ABNORMAL HIGH (ref 0.1–0.9)
Monocytes: 8 %
Neutrophils Absolute: 8.6 10*3/uL — ABNORMAL HIGH (ref 1.4–7.0)
Neutrophils: 64 %
Platelets: 293 10*3/uL (ref 150–450)
RBC: 4.79 x10E6/uL (ref 3.77–5.28)
RDW: 16 % — ABNORMAL HIGH (ref 11.7–15.4)
WBC: 13.8 10*3/uL — ABNORMAL HIGH (ref 3.4–10.8)

## 2022-08-15 LAB — CMP14+EGFR
ALT: 24 IU/L (ref 0–32)
AST: 28 IU/L (ref 0–40)
Albumin/Globulin Ratio: 1.7 (ref 1.2–2.2)
Albumin: 4.4 g/dL (ref 3.7–4.7)
Alkaline Phosphatase: 59 IU/L (ref 44–121)
BUN/Creatinine Ratio: 19 (ref 12–28)
BUN: 23 mg/dL (ref 8–27)
Bilirubin Total: 0.4 mg/dL (ref 0.0–1.2)
CO2: 23 mmol/L (ref 20–29)
Calcium: 10.4 mg/dL — ABNORMAL HIGH (ref 8.7–10.3)
Chloride: 99 mmol/L (ref 96–106)
Creatinine, Ser: 1.2 mg/dL — ABNORMAL HIGH (ref 0.57–1.00)
Globulin, Total: 2.6 g/dL (ref 1.5–4.5)
Glucose: 138 mg/dL — ABNORMAL HIGH (ref 70–99)
Potassium: 5.6 mmol/L — ABNORMAL HIGH (ref 3.5–5.2)
Sodium: 141 mmol/L (ref 134–144)
Total Protein: 7 g/dL (ref 6.0–8.5)
eGFR: 45 mL/min/{1.73_m2} — ABNORMAL LOW (ref >59)

## 2022-08-15 LAB — TSH: TSH: 0.65 u[IU]/mL (ref 0.450–4.500)

## 2022-08-15 LAB — BRAIN NATRIURETIC PEPTIDE: BNP: 383.5 pg/mL — ABNORMAL HIGH (ref 0.0–100.0)

## 2022-08-19 ENCOUNTER — Other Ambulatory Visit: Payer: Self-pay | Admitting: Family

## 2022-08-19 ENCOUNTER — Other Ambulatory Visit: Payer: 59

## 2022-08-19 DIAGNOSIS — D6869 Other thrombophilia: Secondary | ICD-10-CM

## 2022-08-19 DIAGNOSIS — N1831 Chronic kidney disease, stage 3a: Secondary | ICD-10-CM

## 2022-08-19 DIAGNOSIS — R059 Cough, unspecified: Secondary | ICD-10-CM

## 2022-08-19 DIAGNOSIS — I4821 Permanent atrial fibrillation: Secondary | ICD-10-CM

## 2022-08-19 DIAGNOSIS — I5032 Chronic diastolic (congestive) heart failure: Secondary | ICD-10-CM

## 2022-08-19 DIAGNOSIS — E875 Hyperkalemia: Secondary | ICD-10-CM | POA: Diagnosis not present

## 2022-08-19 DIAGNOSIS — I10 Essential (primary) hypertension: Secondary | ICD-10-CM

## 2022-08-19 DIAGNOSIS — I071 Rheumatic tricuspid insufficiency: Secondary | ICD-10-CM

## 2022-08-19 LAB — BMP8+EGFR
BUN/Creatinine Ratio: 19 (ref 12–28)
BUN: 24 mg/dL (ref 8–27)
CO2: 20 mmol/L (ref 20–29)
Calcium: 10.6 mg/dL — ABNORMAL HIGH (ref 8.7–10.3)
Chloride: 100 mmol/L (ref 96–106)
Creatinine, Ser: 1.24 mg/dL — ABNORMAL HIGH (ref 0.57–1.00)
Glucose: 151 mg/dL — ABNORMAL HIGH (ref 70–99)
Potassium: 5.2 mmol/L (ref 3.5–5.2)
Sodium: 140 mmol/L (ref 134–144)
eGFR: 43 mL/min/{1.73_m2} — ABNORMAL LOW (ref >59)

## 2022-08-25 ENCOUNTER — Other Ambulatory Visit: Payer: Self-pay | Admitting: Family

## 2022-08-25 DIAGNOSIS — J449 Chronic obstructive pulmonary disease, unspecified: Secondary | ICD-10-CM

## 2022-08-26 DIAGNOSIS — J8410 Pulmonary fibrosis, unspecified: Secondary | ICD-10-CM | POA: Diagnosis not present

## 2022-08-26 DIAGNOSIS — I509 Heart failure, unspecified: Secondary | ICD-10-CM | POA: Diagnosis not present

## 2022-08-26 DIAGNOSIS — J449 Chronic obstructive pulmonary disease, unspecified: Secondary | ICD-10-CM | POA: Diagnosis not present

## 2022-09-03 ENCOUNTER — Other Ambulatory Visit: Payer: Self-pay | Admitting: Family

## 2022-09-03 DIAGNOSIS — E559 Vitamin D deficiency, unspecified: Secondary | ICD-10-CM

## 2022-09-03 NOTE — Telephone Encounter (Signed)
Lorraine Leonard,  Are you alright to refill?  Pt had labs in April but Vit D was not ordered.

## 2022-09-23 ENCOUNTER — Ambulatory Visit (INDEPENDENT_AMBULATORY_CARE_PROVIDER_SITE_OTHER): Payer: 59

## 2022-09-23 DIAGNOSIS — I4821 Permanent atrial fibrillation: Secondary | ICD-10-CM | POA: Diagnosis not present

## 2022-09-23 LAB — CUP PACEART REMOTE DEVICE CHECK
Battery Remaining Longevity: 33 mo
Battery Voltage: 2.85 V
Brady Statistic AP VP Percent: 5.63 %
Brady Statistic AP VS Percent: 93.31 %
Brady Statistic AS VP Percent: 0 %
Brady Statistic AS VS Percent: 1.06 %
Brady Statistic RA Percent Paced: 99.87 %
Brady Statistic RV Percent Paced: 5.63 %
Date Time Interrogation Session: 20240603200709
Implantable Lead Connection Status: 753985
Implantable Lead Connection Status: 753985
Implantable Lead Implant Date: 20190128
Implantable Lead Implant Date: 20190128
Implantable Lead Location: 753860
Implantable Lead Location: 753860
Implantable Lead Model: 3830
Implantable Lead Model: 5076
Implantable Pulse Generator Implant Date: 20190128
Lead Channel Impedance Value: 304 Ohm
Lead Channel Impedance Value: 361 Ohm
Lead Channel Impedance Value: 380 Ohm
Lead Channel Impedance Value: 399 Ohm
Lead Channel Sensing Intrinsic Amplitude: 10.875 mV
Lead Channel Sensing Intrinsic Amplitude: 10.875 mV
Lead Channel Sensing Intrinsic Amplitude: 3 mV
Lead Channel Sensing Intrinsic Amplitude: 3 mV
Lead Channel Setting Pacing Amplitude: 2.5 V
Lead Channel Setting Pacing Amplitude: 2.5 V
Lead Channel Setting Pacing Pulse Width: 0.4 ms
Lead Channel Setting Sensing Sensitivity: 0.9 mV
Zone Setting Status: 755011
Zone Setting Status: 755011

## 2022-09-25 ENCOUNTER — Encounter: Payer: Self-pay | Admitting: Nurse Practitioner

## 2022-09-25 ENCOUNTER — Ambulatory Visit: Payer: 59 | Attending: Physician Assistant | Admitting: Nurse Practitioner

## 2022-09-25 ENCOUNTER — Other Ambulatory Visit: Payer: Self-pay | Admitting: Nurse Practitioner

## 2022-09-25 VITALS — BP 142/72 | HR 79 | Ht 67.0 in | Wt 200.8 lb

## 2022-09-25 DIAGNOSIS — N183 Chronic kidney disease, stage 3 unspecified: Secondary | ICD-10-CM

## 2022-09-25 DIAGNOSIS — Z79899 Other long term (current) drug therapy: Secondary | ICD-10-CM | POA: Diagnosis not present

## 2022-09-25 DIAGNOSIS — Z950 Presence of cardiac pacemaker: Secondary | ICD-10-CM

## 2022-09-25 DIAGNOSIS — R600 Localized edema: Secondary | ICD-10-CM

## 2022-09-25 DIAGNOSIS — I4821 Permanent atrial fibrillation: Secondary | ICD-10-CM

## 2022-09-25 DIAGNOSIS — I5032 Chronic diastolic (congestive) heart failure: Secondary | ICD-10-CM

## 2022-09-25 DIAGNOSIS — R0789 Other chest pain: Secondary | ICD-10-CM | POA: Diagnosis not present

## 2022-09-25 DIAGNOSIS — J8410 Pulmonary fibrosis, unspecified: Secondary | ICD-10-CM

## 2022-09-25 DIAGNOSIS — E559 Vitamin D deficiency, unspecified: Secondary | ICD-10-CM

## 2022-09-25 DIAGNOSIS — M25473 Effusion, unspecified ankle: Secondary | ICD-10-CM

## 2022-09-25 DIAGNOSIS — R0609 Other forms of dyspnea: Secondary | ICD-10-CM | POA: Diagnosis not present

## 2022-09-25 DIAGNOSIS — S99921A Unspecified injury of right foot, initial encounter: Secondary | ICD-10-CM

## 2022-09-25 NOTE — Patient Instructions (Addendum)
Medication Instructions:  Your physician recommends that you continue on your current medications as directed. Please refer to the Current Medication list given to you today.   Labwork: BMET and ProBNP at [facility] at least in the next couple of days  Testing/Procedures: None  Follow-Up: Your physician recommends that you schedule a follow-up appointment in: 6-8 weeks  Any Other Special Instructions Will Be Listed Below (If Applicable).  You have been referred to Podiatry  If you need a refill on your cardiac medications before your next appointment, please call your pharmacy.

## 2022-09-25 NOTE — Progress Notes (Signed)
Office Visit    Patient Name: Lorraine Leonard Date of Encounter: 09/25/2022  PCP:  Junie Spencer, FNP   [facility] Cardiologist:  Dina Rich, MD  Advanced Practice Provider:  No care team member to display Electrophysiologist:  Lewayne Bunting, MD   Chief Complaint    Lorraine Leonard is a 82 y.o. female with a hx of chronic HFpEF, pulmonary fibrosis, hypertension, permanent A-fib, history of AVN ablation and permanent pacemaker due to difficulty controlling heart rate, hypothyroidism, solitary kidney, CKD stage III, obesity, and suspected OSA, who presents today for 4 month follow-up.    Past Medical History    Past Medical History:  Diagnosis Date   Allergic rhinitis    Anxiety    Anxiety    Arthritis    "~ all my joints" (05/18/2017)   Atrial fibrillation (HCC) 09/2016   Atrial fibrillation with RVR (HCC) 05/18/2017   Chronic heart failure with preserved ejection fraction (HFpEF) (HCC)    Chronic kidney disease, stage 3a (HCC)    Chronic lower back pain    COPD (chronic obstructive pulmonary disease) (HCC)    Depression    Diverticulosis    GERD (gastroesophageal reflux disease)    Glaucoma    Gout    History of blood transfusion 1983   "when I gave a 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"   Hyperlipidemia    Hypertension    Hypothyroidism    Macular degeneration of both eyes    Neuropathy    On home oxygen therapy    "3L; 24/7" (05/18/2017)   Pre-diabetes    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   Pulmonary fibrosis (HCC)    Rheumatoid arthritis (HCC)    Sleep apnea    uses 3 liters of o2 24/7   Urticaria    Past Surgical History:  Procedure Laterality Date   AV NODE ABLATION  05/18/2017   AV NODE ABLATION N/A 05/18/2017   Procedure: AV NODE ABLATION;  Surgeon: Marinus Maw, MD;  Location: [facility];  Service: Cardiovascular;  Laterality: N/A;   BACK SURGERY     BREAST LUMPECTOMY Left    CARDIOVERSION N/A 01/30/2017    Procedure: CARDIOVERSION;  Surgeon: Wendall Stade, MD;  Location: [facility];  Service: Cardiovascular;  Laterality: N/A;   CARDIOVERSION N/A 04/09/2017   Procedure: CARDIOVERSION;  Surgeon: Jonelle Sidle, MD;  Location: [facility];  Service: Cardiovascular;  Laterality: N/A;   CATARACT EXTRACTION W/ INTRAOCULAR LENS  IMPLANT, BILATERAL Bilateral    DILATION AND CURETTAGE OF UTERUS     EUS N/A 10/03/2015   Procedure: UPPER ENDOSCOPIC ULTRASOUND (EUS) RADIAL;  Surgeon: Willis Modena, MD;  Location: [facility];  Service: Endoscopy;  Laterality: N/A;   FINE NEEDLE ASPIRATION N/A 10/03/2015   Procedure: FINE NEEDLE ASPIRATION (FNA) RADIAL;  Surgeon: Willis Modena, MD;  Location: [facility];  Service: Endoscopy;  Laterality: N/A;   INSERT / REPLACE / REMOVE PACEMAKER  05/18/2017   LUMBAR LAMINECTOMY  1995; 06/23/2008   L4-5/notes 08/20/2010   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   NEPHRECTOMY TRANSPLANTED ORGAN     PACEMAKER IMPLANT N/A 05/18/2017   Procedure: PACEMAKER IMPLANT;  Surgeon: Marinus Maw, MD;  Location: [facility];  Service: Cardiovascular;  Laterality: N/A;   TOTAL ABDOMINAL HYSTERECTOMY     TUBAL LIGATION      Allergies  Allergies  Allergen Reactions   Allopurinol  Swelling    Feet and legs swell   Mobic [Meloxicam] Swelling and Other (See Comments)    Feet and Leg swelling   Macrodantin [Nitrofurantoin Macrocrystal]     History of Present Illness    Lorraine Leonard is a 82 y.o. female with a PMH as mentioned above. Pt is closely followed by EP.   Last seen by Ronie Spies, PA-C on May 27, 2022.  She noted transient nosebleed x 2 episodes.  She noted increased shortness of breath at night, reported to be a mouth breather.  Had chronic mild right ankle edema, was unchanged.  Did note generalized fatigue and occasional abdominal discomfort at times. Echo updated with stable findings - result noted below.   Today she  presents for follow-up. Her chief concern is she "gives out" more easily.  Not able to walk as far as she could before without getting short of breath.  Weight is doing good, she has lost around another 5 pounds since April 2024.  Does note acute injury to right lower foot due to her dog.  She shows me her right foot while speaking to me that reveals redness on top of foot and discolored toes that are black. Says it looks better than it did before.  Has significant swelling that has increased more in the ankle/pedal edema. Does admit to rare episodes of chest pain, described as transient "tingling" sensation, denies any exertional symptoms, lasts only few seconds per her report. Denies any palpitations, syncope, presyncope, dizziness, orthopnea, PND, significant weight changes, acute bleeding, or claudication.  EKGs/Labs/Other Studies Reviewed:   The following studies were reviewed today:   EKG:  EKG is not ordered today.    Echo 07/2022: 1. Left ventricular ejection fraction, by estimation, is 65 to 70%. The  left ventricle has normal function. The left ventricle has no regional  wall motion abnormalities. There is mild left ventricular hypertrophy.  Left ventricular diastolic function  could not be evaluated.   2. Right ventricular systolic function is normal. The right ventricular  size is normal. There is mildly elevated pulmonary artery systolic  pressure. The estimated right ventricular systolic pressure is 38.8 mmHg.   3. Left atrial size was mild to moderately dilated.   4. Right atrial size was mildly dilated.   5. The mitral valve is normal in structure. Mild mitral valve  regurgitation. No evidence of mitral stenosis.   6. The aortic valve is tricuspid. There is mild calcification of the  aortic valve. Aortic valve regurgitation is mild. No aortic stenosis is  present.   7. Aortic dilatation noted. There is borderline dilatation of the aortic  root, measuring 38 mm.   8. The  inferior vena cava is normal in size with greater than 50%  respiratory variability, suggesting right atrial pressure of 3 mmHg.   Comparison(s): No significant change from prior study. Stable mild TR,  mild AI.  Recent Labs: 08/14/2022: ALT 24; BNP 383.5; Hemoglobin 12.2; Platelets 293; TSH 0.650 08/19/2022: BUN 24; Creatinine, Ser 1.24; Potassium 5.2; Sodium 140  Recent Lipid Panel    Component Value Date/Time   CHOL 146 02/25/2022 1508   TRIG 216 (H) 02/25/2022 1508   TRIG 169 (H) 08/17/2014 1136   HDL 47 02/25/2022 1508   HDL 44 08/17/2014 1136   CHOLHDL 3.1 02/25/2022 1508   CHOLHDL 3.1 02/13/2017 0430   VLDL 24 02/13/2017 0430   LDLCALC 64 02/25/2022 1508    Risk Assessment/Calculations:   CHA2DS2-VASc Score = 5  This indicates a 7.2% annual risk of stroke. The patient's score is based upon: CHF History: 1 HTN History: 1 Diabetes History: 0 Stroke History: 0 Vascular Disease History: 0 Age Score: 2 Gender Score: 1   Home Medications   Current Meds  Medication Sig   albuterol (VENTOLIN HFA) 108 (90 Base) MCG/ACT inhaler INHALE 2 PUFFS INTO THE LUNGS EVERY 6 HOURS AS NEEDED FOR WHEEZE OR SHORTNESS OF BREATH   atenolol (TENORMIN) 25 MG tablet TAKE 1 TABLET (25 MG TOTAL) BY MOUTH DAILY.   calcium citrate (CALCITRATE - DOSED IN MG ELEMENTAL CALCIUM) 950 MG tablet Take 1 tablet by mouth 2 (two) times daily.    cyanocobalamin 1000 MCG tablet Take 1,000 mcg by mouth daily. Vitamin b12 every AM   diclofenac Sodium (VOLTAREN) 1 % GEL APPLY 4 GRAMS TOPICALLY 4 TIMES A DAY   ELIQUIS 5 MG TABS tablet TAKE 1 TABLET BY MOUTH TWICE A DAY   fluticasone (FLONASE) 50 MCG/ACT nasal spray PLACE 1 SPRAY INTO BOTH NOSTRILS 2 (TWO) TIMES DAILY AS NEEDED FOR ALLERGIES OR RHINITIS.   furosemide (LASIX) 40 MG tablet TAKE 1 TABLET BY MOUTH TWICE A DAY   levocetirizine (XYZAL) 5 MG tablet TAKE 1 TABLET BY MOUTH EVERY DAY IN THE EVENING   levothyroxine (SYNTHROID) 100 MCG tablet TAKE 1 TABLET  BY MOUTH EVERY DAY   metFORMIN (GLUCOPHAGE-XR) 500 MG 24 hr tablet TAKE 1 TABLET BY MOUTH EVERY DAY WITH BREAKFAST   Multiple Vitamins-Minerals (PRESERVISION AREDS 2) CAPS Take 1 capsule by mouth 2 (two) times daily.   Olopatadine HCl 0.2 % SOLN INSTILL 1 DROP INTO BOTH EYES EVERY DAY   omeprazole (PRILOSEC) 20 MG capsule TAKE 1 CAPSULE BY MOUTH EVERY DAY   ondansetron (ZOFRAN ODT) 4 MG disintegrating tablet Take 1 tablet (4 mg total) by mouth every 8 (eight) hours as needed for nausea or vomiting.   OXYGEN Inhale 3 L into the lungs every evening.   Polyethyl Glycol-Propyl Glycol (SYSTANE) 0.4-0.3 % GEL ophthalmic gel Place 1 application into both eyes at bedtime as needed (dry eyes).   predniSONE (DELTASONE) 5 MG tablet TAKE 1 TABLET (5 MG TOTAL) BY MOUTH 2 (TWO) TIMES DAILY WITH A MEAL.   rosuvastatin (CRESTOR) 10 MG tablet TAKE 1 TABLET BY MOUTH EVERYDAY AT BEDTIME   spironolactone (ALDACTONE) 25 MG tablet TAKE 1 TABLET (25 MG TOTAL) BY MOUTH DAILY.   vitamin C (ASCORBIC ACID) 500 MG tablet Take 1,000 mg by mouth daily. AM   [DISCONTINUED] Vitamin D, Ergocalciferol, (DRISDOL) 1.25 MG (50000 UNIT) CAPS capsule TAKE 1 CAPSULE (50,000 UNITS TOTAL) BY MOUTH EVERY 7 (SEVEN) DAYS     Review of Systems    All other systems reviewed and are otherwise negative except as noted above.  Physical Exam    VS:  BP (!) 142/72   Pulse 79   Ht 5\' 7"  (1.702 m)   Wt 200 lb 12.8 oz (91.1 kg)   SpO2 97%   BMI 31.45 kg/m  , BMI Body mass index is 31.45 kg/m.  Wt Readings from Last 3 Encounters:  09/25/22 200 lb 12.8 oz (91.1 kg)  08/14/22 204 lb (92.5 kg)  07/15/22 207 lb (93.9 kg)    Repeat BP: 137/62  GEN: Well nourished, well developed, in no acute distress. HEENT: normal. Neck: Supple, no JVD, carotid bruits, or masses. Cardiac: S1/S2, RRR, no murmurs, rubs, or gallops. No clubbing, cyanosis.  Pedal and +2 pitting edema noted to right ankle and right foot, otherwise  no edema. Radials 2+/PT 1+  and equal bilaterally.  Respiratory:  Respirations regular and unlabored, clear to auscultation bilaterally. GI: Soft, nontender, nondistended. MS: No deformity or atrophy. Generalized weakness, sitting in wheelchair Skin: Redness noted to top of right foot, black toes, otherwise warm and dry, no rash. Neuro:  Strength and sensation are intact. Psych: Normal affect.  Assessment & Plan    Chronic HFpEF Stage C, NYHA class II-III symptoms. Euvolemic and well compensated on exam. Echo 07/2022 revealed EF 65-70%. Continue atenolol, Lasix, and Aldactone. Low sodium diet, fluid restriction <2L, and daily weights encouraged. Educated to contact our office for weight gain of 2 lbs overnight or 5 lbs in one week. Will obtain proBNP and BMET in 1 week.   2. Atypical chest pain Rare episodes of transient chest pain. Denies any exertional symptoms or recent active chest pain. No indication for ischemic evaluation at this time. Will continue to monitor at this time. No medication changes. Heart healthy diet and regular cardiovascular exercise encouraged. ED precautions discussed.   3. Permanent A-fib, s/p AVN ablation and PPM Denies any tachycardia or palpitations. Recent remote device check revealed normal device function. No medication changes at this time. Continue to follow-up with EP.   4. CKD stage III Most recent labs stable. Obtaining BMET and proBNP as mentioned above. Avoid nephrotoxic agents. Encouraged adequate hydration. Continue to follow with PCP.  5. Shortness of breath with exertion, pulmonary fibrosis, medication management Symptoms appear to be related to pulmonary fibrosis instead of HFpEF as she appears euvolemic on exam, EF stable. No medication changes at this time. Continue to follow-up with pulmonology. ED precautions discussed. Obtaining labs as mentioned above.   6. Pedal/ankle edema, injury to right foot Recent injury to right foot with increased right pedal/ankle edema with  redness to top of foot, black toes noted, pt states appears better than it did before. Referring to podiatry for urgent visit. Obtaining labs as mentioned above and if kidney function permits will increase Lasix dosage x few days then return to normal. Continue to follow with PCP.  Disposition: Follow up in 6-8 week(s) with Dina Rich, MD or APP.  Signed, Sharlene Dory, NP 09/28/2022, 6:36 PM [facility]

## 2022-09-26 DIAGNOSIS — J8410 Pulmonary fibrosis, unspecified: Secondary | ICD-10-CM | POA: Diagnosis not present

## 2022-09-26 DIAGNOSIS — I509 Heart failure, unspecified: Secondary | ICD-10-CM | POA: Diagnosis not present

## 2022-09-26 DIAGNOSIS — J449 Chronic obstructive pulmonary disease, unspecified: Secondary | ICD-10-CM | POA: Diagnosis not present

## 2022-09-29 ENCOUNTER — Encounter: Payer: Self-pay | Admitting: *Deleted

## 2022-10-01 ENCOUNTER — Other Ambulatory Visit: Payer: Self-pay | Admitting: Family

## 2022-10-01 DIAGNOSIS — I152 Hypertension secondary to endocrine disorders: Secondary | ICD-10-CM

## 2022-10-02 ENCOUNTER — Other Ambulatory Visit: Payer: Self-pay | Admitting: Family

## 2022-10-03 MED ORDER — PREDNISONE 5 MG PO TABS: 5.0000 mg | ORAL_TABLET | Freq: Two times a day (BID) | ORAL | 2 refills | Status: DC

## 2022-10-15 NOTE — Progress Notes (Signed)
Remote pacemaker transmission.   

## 2022-10-16 ENCOUNTER — Telehealth: Payer: Self-pay | Admitting: Nurse Practitioner

## 2022-10-16 NOTE — Telephone Encounter (Signed)
patient stated only taking lasik 20g daily and not 40 mg daily. Patient not cofortable increasing to 80 mg twice daily for 3 days and then reduce. to have labs done. Waiting for advice from elizabeth.

## 2022-10-20 ENCOUNTER — Other Ambulatory Visit: Payer: Self-pay | Admitting: Family

## 2022-10-20 ENCOUNTER — Other Ambulatory Visit: Payer: Self-pay | Admitting: Nurse Practitioner

## 2022-10-20 ENCOUNTER — Telehealth: Payer: Self-pay | Admitting: Nurse Practitioner

## 2022-10-20 DIAGNOSIS — K219 Gastro-esophageal reflux disease without esophagitis: Secondary | ICD-10-CM

## 2022-10-20 DIAGNOSIS — J449 Chronic obstructive pulmonary disease, unspecified: Secondary | ICD-10-CM

## 2022-10-20 DIAGNOSIS — Z79899 Other long term (current) drug therapy: Secondary | ICD-10-CM

## 2022-10-20 NOTE — Telephone Encounter (Signed)
Spoke with Patient she was confused and is taking 40 Mg lasik she states she is feeling better than before she has agreeded to do labs when she goes to her PCP later this month lab orders were sent to [facility]

## 2022-10-22 ENCOUNTER — Encounter: Payer: Self-pay | Admitting: Family Medicine

## 2022-10-22 ENCOUNTER — Telehealth: Payer: 59 | Admitting: Family Medicine

## 2022-10-22 DIAGNOSIS — R5383 Other fatigue: Secondary | ICD-10-CM

## 2022-10-22 DIAGNOSIS — R5381 Other malaise: Secondary | ICD-10-CM

## 2022-10-22 DIAGNOSIS — R410 Disorientation, unspecified: Secondary | ICD-10-CM

## 2022-10-22 DIAGNOSIS — R531 Weakness: Secondary | ICD-10-CM | POA: Diagnosis not present

## 2022-10-22 DIAGNOSIS — R339 Retention of urine, unspecified: Secondary | ICD-10-CM

## 2022-10-22 DIAGNOSIS — R300 Dysuria: Secondary | ICD-10-CM

## 2022-10-22 NOTE — Progress Notes (Signed)
Virtual Visit via Video   I connected with patient on 10/22/22 at 1630 by a video enabled telemedicine application and verified that I am speaking with the correct person using two identifiers.  Location patient: Home Location provider: [facility] Persons participating in the virtual visit: Patient and Provider  I discussed the limitations of evaluation and management by telemedicine and the availability of in person appointments. The patient expressed understanding and agreed to proceed.  Subjective:   HPI:  Pt presents today for  Chief Complaint  Patient presents with   Dysuria   Pt reports dysuria, weakness, confusion, malaise, urinary retention, and not feeling well. States she started off with UTI symptoms but now feels worse. States this UTI does not feel the same as her other UTIs. States she has back and lower abdominal pain.   Review of Systems  Constitutional:  Positive for chills and malaise/fatigue. Negative for diaphoresis, fever and weight loss.  Eyes:  Negative for pain, discharge and redness.  Gastrointestinal:  Positive for abdominal pain. Negative for blood in stool, constipation, diarrhea, heartburn, melena, nausea and vomiting.  Genitourinary:  Positive for dysuria and flank pain. Negative for frequency, hematuria and urgency.  Neurological:  Positive for weakness.       Confusion  All other systems reviewed and are negative.    Patient Active Problem List   Diagnosis Date Noted   Epigastric pain 03/27/2021   Gastric nodule 03/27/2021   Infectious colitis, enteritis and gastroenteritis 03/27/2021   Iron deficiency anemia due to chronic blood loss 03/27/2021   Personal history of colonic polyps 03/27/2021   Intractable back pain 07/08/2020   CKD (chronic kidney disease), stage 3b (HCC) 07/08/2020   Aorto-iliac atherosclerosis (HCC) 07/08/2020   Chronic diastolic heart failure (HCC) 07/08/2020   Asthmatic bronchitis ,  chronic 03/19/2020   Idiopathic urticaria 10/19/2018   Osteoarthritis 07/22/2018   Chronic respiratory failure with hypoxia (HCC) 03/26/2017   COPD (chronic obstructive pulmonary disease) (HCC) 03/26/2017   Atrial fibrillation (HCC) 02/09/2017   Acute diastolic CHF (congestive heart failure) (HCC) 11/11/2016   Pulmonary fibrosis (HCC) 09/23/2016   Type 2 diabetes mellitus (HCC) 07/17/2016   Obesity (BMI 30-39.9) 04/18/2016   Snoring 11/09/2015   Pulmonary hypertension (HCC) 11/09/2015   Gout 10/19/2015   Chronic urticaria 03/24/2015   Metabolic syndrome 03/01/2015   Single kidney 03/01/2015   Macular degeneration of both eyes 11/28/2014   Constipation 11/28/2014   Chronic rhinitis 11/17/2014   Hypertensive retinopathy 10/30/2014   Vitamin D deficiency 08/17/2014   Arthritis of knee 08/17/2014   Hyperlipidemia associated with type 2 diabetes mellitus (HCC) 08/17/2014   Depression    Hypothyroidism    Anxiety    Cardiomegaly 05/05/2011   Hypertension associated with diabetes (HCC) 02/08/2007   PULMONARY NODULE 02/08/2007   GASTROESOPHAGEAL REFLUX DISEASE 02/08/2007    Social History   Tobacco Use   Smoking status: Never    Passive exposure: Never   Smokeless tobacco: Never  Substance Use Topics   Alcohol use: No    Current Outpatient Medications:    albuterol (VENTOLIN HFA) 108 (90 Base) MCG/ACT inhaler, INHALE 2 PUFFS INTO THE LUNGS EVERY 6 HOURS AS NEEDED FOR WHEEZE OR SHORTNESS OF BREATH, Disp: 8.5 each, Rfl: 1   atenolol (TENORMIN) 25 MG tablet, TAKE 1 TABLET (25 MG TOTAL) BY MOUTH DAILY., Disp: 90 tablet, Rfl: 0   calcium citrate (CALCITRATE - DOSED IN MG ELEMENTAL CALCIUM) 950 MG tablet, Take 1 tablet by  mouth 2 (two) times daily. , Disp: , Rfl:    cyanocobalamin 1000 MCG tablet, Take 1,000 mcg by mouth daily. Vitamin b12 every AM, Disp: , Rfl:    diclofenac Sodium (VOLTAREN) 1 % GEL, APPLY 4 GRAMS TOPICALLY 4 TIMES A DAY, Disp: 400 g, Rfl: 1   ELIQUIS 5 MG TABS  tablet, TAKE 1 TABLET BY MOUTH TWICE A DAY, Disp: 180 tablet, Rfl: 0   estradiol (ESTRACE VAGINAL) 0.1 MG/GM vaginal cream, Place 1 Applicatorful vaginally at bedtime. (Patient not taking: Reported on 09/25/2022), Disp: 42.5 g, Rfl: 12   fluticasone (FLONASE) 50 MCG/ACT nasal spray, PLACE 1 SPRAY INTO BOTH NOSTRILS 2 (TWO) TIMES DAILY AS NEEDED FOR ALLERGIES OR RHINITIS., Disp: 48 mL, Rfl: 1   furosemide (LASIX) 40 MG tablet, TAKE 1 TABLET BY MOUTH TWICE A DAY, Disp: 180 tablet, Rfl: 0   levocetirizine (XYZAL) 5 MG tablet, TAKE 1 TABLET BY MOUTH EVERY DAY IN THE EVENING, Disp: 90 tablet, Rfl: 3   levothyroxine (SYNTHROID) 100 MCG tablet, TAKE 1 TABLET BY MOUTH EVERY DAY, Disp: 90 tablet, Rfl: 0   metFORMIN (GLUCOPHAGE-XR) 500 MG 24 hr tablet, TAKE 1 TABLET BY MOUTH EVERY DAY WITH BREAKFAST, Disp: 90 tablet, Rfl: 2   Multiple Vitamins-Minerals (PRESERVISION AREDS 2) CAPS, Take 1 capsule by mouth 2 (two) times daily., Disp: , Rfl:    Olopatadine HCl 0.2 % SOLN, INSTILL 1 DROP INTO BOTH EYES EVERY DAY, Disp: 10 mL, Rfl: 5   omeprazole (PRILOSEC) 20 MG capsule, TAKE 1 CAPSULE BY MOUTH EVERY DAY, Disp: 90 capsule, Rfl: 0   ondansetron (ZOFRAN ODT) 4 MG disintegrating tablet, Take 1 tablet (4 mg total) by mouth every 8 (eight) hours as needed for nausea or vomiting., Disp: 20 tablet, Rfl: 0   OXYGEN, Inhale 3 L into the lungs every evening., Disp: , Rfl:    Polyethyl Glycol-Propyl Glycol (SYSTANE) 0.4-0.3 % GEL ophthalmic gel, Place 1 application into both eyes at bedtime as needed (dry eyes)., Disp: , Rfl:    predniSONE (DELTASONE) 5 MG tablet, Take 1 tablet (5 mg total) by mouth 2 (two) times daily with a meal., Disp: 180 tablet, Rfl: 2   rosuvastatin (CRESTOR) 10 MG tablet, TAKE 1 TABLET BY MOUTH EVERYDAY AT BEDTIME, Disp: 90 tablet, Rfl: 3   spironolactone (ALDACTONE) 25 MG tablet, TAKE 1 TABLET (25 MG TOTAL) BY MOUTH DAILY., Disp: 90 tablet, Rfl: 2   vitamin C (ASCORBIC ACID) 500 MG tablet, Take 1,000  mg by mouth daily. AM, Disp: , Rfl:    Vitamin D, Ergocalciferol, (DRISDOL) 1.25 MG (50000 UNIT) CAPS capsule, TAKE 1 CAPSULE (50,000 UNITS TOTAL) BY MOUTH EVERY 7 (SEVEN) DAYS, Disp: 12 capsule, Rfl: 3  Allergies  Allergen Reactions   Allopurinol Swelling    Feet and legs swell   Mobic [Meloxicam] Swelling and Other (See Comments)    Feet and Leg swelling   Macrodantin [Nitrofurantoin Macrocrystal]     Objective:   There were no vitals taken for this visit.  Patient appears ill.  Head is normocephalic, atraumatic.  No labored breathing.  Speech is clear and coherent with logical content.  Patient is alert and oriented at baseline.    Assessment and Plan:   Lorraine Leonard was seen today for dysuria.  Diagnoses and all orders for this visit:  Urinary retention Dysuria Weakness Confusion Malaise and fatigue Due to reported worsening and new symptoms, recommended pt to be seen in person as she could have pyelonephritis or urosepsis. Pt agrees to  plan.       Kari Baars, FNP-C [facility] [address] [place], [place] [address] [phone]  10/22/2022  Time spent with the patient: 15 minutes, of which >50% was spent in obtaining information about symptoms, reviewing previous labs, evaluations, and treatments, counseling about condition (please see the discussed topics above), and developing a plan to further investigate it; had a number of questions which I addressed.

## 2022-10-26 DIAGNOSIS — J8410 Pulmonary fibrosis, unspecified: Secondary | ICD-10-CM | POA: Diagnosis not present

## 2022-10-26 DIAGNOSIS — J449 Chronic obstructive pulmonary disease, unspecified: Secondary | ICD-10-CM | POA: Diagnosis not present

## 2022-10-26 DIAGNOSIS — I509 Heart failure, unspecified: Secondary | ICD-10-CM | POA: Diagnosis not present

## 2022-10-30 ENCOUNTER — Encounter: Payer: Self-pay | Admitting: Nurse Practitioner

## 2022-10-30 ENCOUNTER — Ambulatory Visit (INDEPENDENT_AMBULATORY_CARE_PROVIDER_SITE_OTHER): Payer: 59 | Admitting: Nurse Practitioner

## 2022-10-30 VITALS — BP 127/80 | HR 75 | Temp 97.5°F | Resp 20 | Ht 67.0 in | Wt 200.0 lb

## 2022-10-30 DIAGNOSIS — N3000 Acute cystitis without hematuria: Secondary | ICD-10-CM

## 2022-10-30 DIAGNOSIS — R300 Dysuria: Secondary | ICD-10-CM | POA: Diagnosis not present

## 2022-10-30 LAB — URINALYSIS, COMPLETE
Bilirubin, UA: NEGATIVE
Glucose, UA: NEGATIVE
Ketones, UA: NEGATIVE
Nitrite, UA: NEGATIVE
Specific Gravity, UA: 1.01 (ref 1.005–1.030)
Urobilinogen, Ur: 0.2 mg/dL (ref 0.2–1.0)
pH, UA: 6 (ref 5.0–7.5)

## 2022-10-30 LAB — MICROSCOPIC EXAMINATION
Renal Epithel, UA: NONE SEEN /hpf
WBC, UA: >30 /hpf — AB (ref 0–5)

## 2022-10-30 MED ORDER — CEPHALEXIN 500 MG PO CAPS
500.0000 mg | ORAL_CAPSULE | Freq: Two times a day (BID) | ORAL | 0 refills | Status: DC
Start: 2022-10-30 — End: 2022-11-17

## 2022-10-30 NOTE — Progress Notes (Signed)
Subjective:    Patient ID: Lorraine Leonard, female    DOB: 01/07/1941, 82 y.o.   MRN: [id number]   Chief Complaint: Urinary Frequency   Urinary Frequency  This is a new problem. The current episode started 1 to 4 weeks ago. The problem occurs every urination. The problem has been waxing and waning. The quality of the pain is described as burning and aching. The pain is at a severity of 8/10. The pain is moderate. There has been no fever. She is Not sexually active. There is No history of pyelonephritis. Associated symptoms include frequency and urgency. She has tried nothing for the symptoms. The treatment provided mild relief.    Patient Active Problem List   Diagnosis Date Noted   Epigastric pain 03/27/2021   Gastric nodule 03/27/2021   Infectious colitis, enteritis and gastroenteritis 03/27/2021   Iron deficiency anemia due to chronic blood loss 03/27/2021   Personal history of colonic polyps 03/27/2021   Intractable back pain 07/08/2020   CKD (chronic kidney disease), stage 3b (HCC) 07/08/2020   Aorto-iliac atherosclerosis (HCC) 07/08/2020   Chronic diastolic heart failure (HCC) 07/08/2020   Asthmatic bronchitis , chronic 03/19/2020   Idiopathic urticaria 10/19/2018   Osteoarthritis 07/22/2018   Chronic respiratory failure with hypoxia (HCC) 03/26/2017   COPD (chronic obstructive pulmonary disease) (HCC) 03/26/2017   Atrial fibrillation (HCC) 02/09/2017   Acute diastolic CHF (congestive heart failure) (HCC) 11/11/2016   Pulmonary fibrosis (HCC) 09/23/2016   Type 2 diabetes mellitus (HCC) 07/17/2016   Obesity (BMI 30-39.9) 04/18/2016   Snoring 11/09/2015   Pulmonary hypertension (HCC) 11/09/2015   Gout 10/19/2015   Chronic urticaria 03/24/2015   Metabolic syndrome 03/01/2015   Single kidney 03/01/2015   Macular degeneration of both eyes 11/28/2014   Constipation 11/28/2014   Chronic rhinitis 11/17/2014   Hypertensive retinopathy 10/30/2014   Vitamin D deficiency  08/17/2014   Arthritis of knee 08/17/2014   Hyperlipidemia associated with type 2 diabetes mellitus (HCC) 08/17/2014   Depression    Hypothyroidism    Anxiety    Cardiomegaly 05/05/2011   Hypertension associated with diabetes (HCC) 02/08/2007   PULMONARY NODULE 02/08/2007   GASTROESOPHAGEAL REFLUX DISEASE 02/08/2007       Review of Systems  Genitourinary:  Positive for dysuria, frequency and urgency.       Objective:   Physical Exam Vitals reviewed.  Constitutional:      Appearance: Normal appearance.  Cardiovascular:     Rate and Rhythm: Normal rate and regular rhythm.  Pulmonary:     Effort: Pulmonary effort is normal.     Breath sounds: Normal breath sounds.  Skin:    General: Skin is warm.  Neurological:     General: No focal deficit present.     Mental Status: She is alert and oriented to person, place, and time.  Psychiatric:        Mood and Affect: Mood normal.        Behavior: Behavior normal.    BP 127/80   Pulse 75   Temp (!) 97.5 F (36.4 C) (Temporal)   Resp 20   Ht 5\' 7"  (1.702 m)   Wt 200 lb (90.7 kg)   SpO2 97%   BMI 31.32 kg/m         Assessment & Plan:  Lorraine Leonard in today with chief complaint of Urinary Frequency   1. Dysuria - Urinalysis, Complete - Urine Culture  2. Acute cystitis without hematuria Take medication as prescribe Cotton underwear  Take shower not bath Cranberry juice, yogurt Force fluids AZO over the counter X2 days Culture pending RTO prn  - cephALEXin (KEFLEX) 500 MG capsule; Take 1 capsule (500 mg total) by mouth 2 (two) times daily.  Dispense: 14 capsule; Refill: 0    The above assessment and management plan was discussed with the patient. The patient verbalized understanding of and has agreed to the management plan. Patient is aware to call the clinic if symptoms persist or worsen. Patient is aware when to return to the clinic for a follow-up visit. Patient educated on when it is appropriate to go to  the emergency department.   Mary-Margaret Daphine Deutscher, FNP

## 2022-10-30 NOTE — Patient Instructions (Signed)
Take medication as prescribe Cotton underwear Take shower not bath Cranberry juice, yogurt Force fluids AZO over the counter X2 days Culture pending RTO prn  

## 2022-11-02 ENCOUNTER — Other Ambulatory Visit: Payer: Self-pay | Admitting: Family

## 2022-11-04 LAB — URINE CULTURE

## 2022-11-06 ENCOUNTER — Encounter: Payer: Self-pay | Admitting: Nurse Practitioner

## 2022-11-06 ENCOUNTER — Ambulatory Visit: Payer: 59 | Attending: Nurse Practitioner | Admitting: Nurse Practitioner

## 2022-11-06 VITALS — BP 110/60 | HR 58 | Ht 68.0 in | Wt 199.8 lb

## 2022-11-06 DIAGNOSIS — N183 Chronic kidney disease, stage 3 unspecified: Secondary | ICD-10-CM

## 2022-11-06 DIAGNOSIS — S99921A Unspecified injury of right foot, initial encounter: Secondary | ICD-10-CM

## 2022-11-06 DIAGNOSIS — I4821 Permanent atrial fibrillation: Secondary | ICD-10-CM

## 2022-11-06 DIAGNOSIS — Z950 Presence of cardiac pacemaker: Secondary | ICD-10-CM | POA: Diagnosis not present

## 2022-11-06 DIAGNOSIS — Z79899 Other long term (current) drug therapy: Secondary | ICD-10-CM

## 2022-11-06 DIAGNOSIS — I5032 Chronic diastolic (congestive) heart failure: Secondary | ICD-10-CM | POA: Diagnosis not present

## 2022-11-06 DIAGNOSIS — S99921D Unspecified injury of right foot, subsequent encounter: Secondary | ICD-10-CM

## 2022-11-06 DIAGNOSIS — J8410 Pulmonary fibrosis, unspecified: Secondary | ICD-10-CM

## 2022-11-06 DIAGNOSIS — R600 Localized edema: Secondary | ICD-10-CM | POA: Diagnosis not present

## 2022-11-06 DIAGNOSIS — M25473 Effusion, unspecified ankle: Secondary | ICD-10-CM | POA: Diagnosis not present

## 2022-11-06 DIAGNOSIS — R0602 Shortness of breath: Secondary | ICD-10-CM | POA: Diagnosis not present

## 2022-11-06 DIAGNOSIS — R0609 Other forms of dyspnea: Secondary | ICD-10-CM | POA: Diagnosis not present

## 2022-11-06 NOTE — Progress Notes (Signed)
Office Visit    Patient Name: Lorraine Leonard Date of Encounter: 11/06/2022 PCP:  Lorraine Spencer, FNP [facility] Cardiologist:  Lorraine Rich, MD  Advanced Practice Provider:  No care team member to display Electrophysiologist:  Lorraine Bunting, MD   Chief Complaint    Lorraine Leonard is a 82 y.o. 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  Pt is closely followed by EP.   Last seen by Lorraine Spies, PA-C on May 27, 2022.  She noted transient nosebleed x 2 episodes.  She noted increased shortness of breath at night, reported to be a mouth breather.  Had chronic mild right ankle edema, was unchanged.  Did note generalized fatigue and occasional abdominal discomfort at times. Echo updated with stable findings - result noted below.   I last saw her on September 25, 2022. Her chief concern was that she "gave out" more easily.  Not able to walk as far as she could before without getting short of breath.  Weight stable, lost around another 5 pounds since April 2024. Note acute injury to right lower foot due to her dog.  Admitted to rare episodes of chest pain, described as transient "tingling" sensation, denied any exertional symptoms, lasts only few seconds per her report. Labs revealed elevated BNP, offered to increase Lasix to improve symptoms, pt declined.   Today she presents for follow-up. Continues to note worsening shortness of breath. States she is only taking Lasix 40 mg daily instead of 40 mg BID that is listed on medication list. Continues to note right lower ankle swelling that is stable, has not seen podiatry yet. Family member states her toe discoloration has resolved, but ankle remains swollen. Weight is stable. Denies any chest  pain, palpitations, syncope, presyncope, dizziness, orthopnea, PND, significant weight changes, acute bleeding, or claudication. Has recently received treatment for UTI.   EKGs/Labs/Other Studies Reviewed:   The following studies were reviewed today:   EKG:   EKG Interpretation Date/Time:  Thursday November 06 2022 14:20:26 EDT Ventricular Rate:  73 PR Interval:    QRS Duration:  148 QT Interval:  448 QTC Calculation: 493 R Axis:   -57  Text Interpretation: Ventricular-paced rhythm When compared with ECG of 19-Jul-2018 12:24, PREVIOUS ECG IS PRESENT Confirmed by Lorraine Leonard [phone]) on 11/06/2022 2:42:28 PM     Echo 07/2022: 1. Left ventricular ejection fraction, by estimation, is 65 to 70%. The  left ventricle has normal function. The left ventricle has no regional  wall motion abnormalities. There is mild left ventricular hypertrophy.  Left ventricular diastolic function  could not be evaluated.   2. Right ventricular systolic function is normal. The right ventricular  size is normal. There is mildly elevated pulmonary artery systolic  pressure. The estimated right ventricular systolic pressure is 38.8 mmHg.   3. Left atrial size was mild to moderately dilated.   4. Right atrial size was mildly dilated.   5. The mitral valve is normal in structure. Mild mitral valve  regurgitation. No evidence of mitral stenosis.   6. The aortic valve is tricuspid. There is mild calcification of the  aortic valve. Aortic valve regurgitation is mild. No aortic stenosis is  present.   7. Aortic dilatation noted. There is borderline dilatation of the aortic  root,  measuring 38 mm.   8. The inferior vena cava is normal in size with greater than 50%  respiratory variability, suggesting right atrial pressure of 3 mmHg.   Comparison(s): No significant change from prior study. Stable mild TR,  mild AI.  Risk Assessment/Calculations:   CHA2DS2-VASc Score = 5  This indicates a 7.2% annual risk of  stroke. The patient's score is based upon: CHF History: 1 HTN History: 1 Diabetes History: 0 Stroke History: 0 Vascular Disease History: 0 Age Score: 2 Gender Score: 1   Review of Systems    All other systems reviewed and are otherwise negative except as noted above.  Physical Exam    VS:  BP 110/60   Pulse (!) 58   Ht 5\' 8"  (1.727 m)   Wt 199 lb 12.8 oz (90.6 kg)   SpO2 99%   BMI 30.38 kg/m  , BMI Body mass index is 30.38 kg/m.  Wt Readings from Last 3 Encounters:  11/06/22 199 lb 12.8 oz (90.6 kg)  10/30/22 200 lb (90.7 kg)  09/25/22 200 lb 12.8 oz (91.1 kg)    GEN: Well nourished, well developed, in no acute distress. HEENT: normal. Neck: Supple, no JVD, carotid bruits, or masses. Cardiac: S1/S2, RRR, no murmurs, rubs, or gallops. No clubbing, cyanosis.  Pedal and +2 pitting edema noted to right ankle and right foot, otherwise no edema. Radials 2+/PT 1+ and equal bilaterally.  Respiratory:  Respirations regular and unlabored, clear to auscultation bilaterally. GI: Soft, nontender, nondistended. MS: No deformity or atrophy. Generalized weakness, sitting in wheelchair Skin: Warm and dry, no rash. Neuro:  Strength and sensation are intact. Psych: Normal affect.  Assessment & Plan    Chronic HFpEF, medication management Stage C, NYHA class II-III symptoms. Euvolemic and well compensated on exam. Echo 07/2022 revealed EF 65-70%. Continue atenolol, Lasix, and Aldactone. Past BNP elevated. Will obtain BMET per protocol as she has recently been treated for UTI. If kidney function permits, will increase Lasix to 80 mg daily x 4 days, then return to 40 mg daily. Low sodium diet, fluid restriction <2L, and daily weights encouraged. Educated to contact our office for weight gain of 2 lbs overnight or 5 lbs in one week.   2. Permanent A-fib, s/p AVN ablation and PPM Denies any tachycardia or palpitations. Recent remote device check revealed normal device function. No medication  changes at this time. Continue Eliquis for ChadsVasc score of 5, on appropriate dosage. Continue to follow-up with EP.   4. CKD stage III Most recent labs stable. Obtaining BMET as mentioned above. Avoid nephrotoxic agents. Encouraged adequate hydration. Continue to follow with PCP.  5. DOE, pulmonary fibrosis Symptoms appear to be related to pulmonary fibrosis and HFpEF, past BNP elevated. No medication changes at this time. Continue to follow-up with pulmonology. ED precautions discussed. Obtaining BMET as mentioned above.   6. Pedal/ankle edema, injury to right foot Past injury to right foot with stable right pedal/ankle edema, pt states appears better than it did before. Less likely DVT in etiology as pt remains on Eliquis and swelling appears better/stable compared to last visit. Previously referred to podiatry. Obtaining BMET as mentioned above and if kidney function permits will increase Lasix dosage x few days then return to normal. Continue to follow with PCP.   Disposition: Follow up in 6-8 week(s) with Lorraine Rich, MD or APP.  Signed, Lorraine Dory, NP 11/06/2022, 2:42 PM [facility]

## 2022-11-06 NOTE — Patient Instructions (Addendum)
Medication Instructions:  Your physician recommends that you continue on your current medications as directed. Please refer to the Current Medication list given to you today.  Labwork: BMET  Testing/Procedures: none  Follow-Up: Your physician recommends that you schedule a follow-up appointment in: 6-8 weeks with Philis Nettle   Any Other Special Instructions Will Be Listed Below (If Applicable).  If you need a refill on your cardiac medications before your next appointment, please call your pharmacy.

## 2022-11-11 ENCOUNTER — Ambulatory Visit: Payer: 59 | Admitting: Family

## 2022-11-11 ENCOUNTER — Other Ambulatory Visit: Payer: Self-pay

## 2022-11-11 ENCOUNTER — Encounter: Payer: Self-pay | Admitting: Nurse Practitioner

## 2022-11-11 ENCOUNTER — Other Ambulatory Visit: Payer: Self-pay | Admitting: Nurse Practitioner

## 2022-11-11 DIAGNOSIS — Z79899 Other long term (current) drug therapy: Secondary | ICD-10-CM

## 2022-11-13 ENCOUNTER — Ambulatory Visit: Payer: 59 | Admitting: Family

## 2022-11-17 ENCOUNTER — Encounter: Payer: Self-pay | Admitting: Family Medicine

## 2022-11-17 ENCOUNTER — Ambulatory Visit (INDEPENDENT_AMBULATORY_CARE_PROVIDER_SITE_OTHER): Payer: 59 | Admitting: Family Medicine

## 2022-11-17 VITALS — BP 130/61 | HR 78 | Temp 97.9°F | Ht 68.0 in | Wt 196.0 lb

## 2022-11-17 DIAGNOSIS — R350 Frequency of micturition: Secondary | ICD-10-CM | POA: Diagnosis not present

## 2022-11-17 DIAGNOSIS — N390 Urinary tract infection, site not specified: Secondary | ICD-10-CM

## 2022-11-17 LAB — URINALYSIS, ROUTINE W REFLEX MICROSCOPIC
Bilirubin, UA: NEGATIVE
Glucose, UA: NEGATIVE
Ketones, UA: NEGATIVE
Nitrite, UA: NEGATIVE
Protein,UA: NEGATIVE
RBC, UA: NEGATIVE
Specific Gravity, UA: 1.015 (ref 1.005–1.030)
Urobilinogen, Ur: 0.2 mg/dL (ref 0.2–1.0)
pH, UA: 6 (ref 5.0–7.5)

## 2022-11-17 LAB — MICROSCOPIC EXAMINATION
RBC, Urine: NONE SEEN /hpf (ref 0–2)
Renal Epithel, UA: NONE SEEN /hpf

## 2022-11-17 MED ORDER — CEFDINIR 300 MG PO CAPS: 300.0000 mg | ORAL_CAPSULE | Freq: Two times a day (BID) | ORAL | 0 refills | Status: DC

## 2022-11-17 MED ORDER — CEFDINIR 300 MG PO CAPS: 300.0000 mg | ORAL_CAPSULE | Freq: Two times a day (BID) | ORAL | 0 refills | Status: AC

## 2022-11-17 NOTE — Progress Notes (Signed)
   Acute Office Visit  Subjective:     Patient ID: Lorraine Leonard, female    DOB: 02-07-1941, 82 y.o.   MRN: [id number]  Chief Complaint  Patient presents with   Urinary Frequency    Urinary Frequency  This is a new problem. The current episode started 1 to 4 weeks ago. The problem occurs intermittently. The problem has been waxing and waning. The patient is experiencing no pain. There has been no fever. Associated symptoms include frequency. Pertinent negatives include no chills, discharge, flank pain, hematuria, hesitancy, nausea, sweats, urgency or vomiting.   Recently had UTI, treated with keflex. Symptoms got better and then returned. Urine culture was positive on 10/30/22. Hx of recurrent UTIs.    Review of Systems  Constitutional:  Negative for chills.  Gastrointestinal:  Negative for nausea and vomiting.  Genitourinary:  Positive for frequency. Negative for flank pain, hematuria, hesitancy and urgency.        Objective:    BP 130/61   Pulse 78   Temp 97.9 F (36.6 C) (Temporal)   Ht 5\' 8"  (1.727 m)   Wt 196 lb (88.9 kg)   SpO2 94%   BMI 29.80 kg/m    Physical Exam Vitals and nursing note reviewed.  Constitutional:      General: She is not in acute distress.    Appearance: She is obese. She is not ill-appearing, toxic-appearing or diaphoretic.  Cardiovascular:     Rate and Rhythm: Normal rate and regular rhythm.     Heart sounds: Normal heart sounds. No murmur heard. Pulmonary:     Effort: Pulmonary effort is normal. No respiratory distress.     Breath sounds: Normal breath sounds. No stridor. No wheezing or rhonchi.  Abdominal:     General: Bowel sounds are normal. There is no distension.     Palpations: Abdomen is soft.     Tenderness: There is no abdominal tenderness. There is no right CVA tenderness, left CVA tenderness, guarding or rebound.  Musculoskeletal:     Cervical back: Neck supple. No rigidity.     Right lower leg: No edema.     Left lower leg:  No edema.  Neurological:     Mental Status: She is alert and oriented to person, place, and time.     Gait: Gait abnormal (rolling walker).  Psychiatric:        Mood and Affect: Mood normal.     No results found for any visits on 11/17/22.      Assessment & Plan:   [name] was seen today for urinary frequency.  Diagnoses and all orders for this visit:  Urinary frequency UA with 2+ leuks, few bacteria. Will treat empirically with omnicef based off culture results from 10/30/22 pending urine culture as her symptoms did not completely resolve with prior treatment.  -     Urinalysis, Routine w reflex microscopic -     cefdinir (OMNICEF) 300 MG capsule; Take 1 capsule (300 mg total) by mouth 2 (two) times daily for 7 days. 1 po BID  Recurrent UTI -     cefdinir (OMNICEF) 300 MG capsule; Take 1 capsule (300 mg total) by mouth 2 (two) times daily for 7 days. 1 po BID   Return to office for new or worsening symptoms, or if symptoms persist.   The patient indicates understanding of these issues and agrees with the plan.  Gabriel Earing, FNP

## 2022-11-26 DIAGNOSIS — J449 Chronic obstructive pulmonary disease, unspecified: Secondary | ICD-10-CM | POA: Diagnosis not present

## 2022-11-26 DIAGNOSIS — I509 Heart failure, unspecified: Secondary | ICD-10-CM | POA: Diagnosis not present

## 2022-11-26 DIAGNOSIS — J8410 Pulmonary fibrosis, unspecified: Secondary | ICD-10-CM | POA: Diagnosis not present

## 2022-11-30 ENCOUNTER — Other Ambulatory Visit: Payer: Self-pay | Admitting: Family

## 2022-11-30 DIAGNOSIS — E1159 Type 2 diabetes mellitus with other circulatory complications: Secondary | ICD-10-CM

## 2022-12-03 ENCOUNTER — Other Ambulatory Visit: Payer: Self-pay | Admitting: Family

## 2022-12-03 DIAGNOSIS — J449 Chronic obstructive pulmonary disease, unspecified: Secondary | ICD-10-CM

## 2022-12-04 ENCOUNTER — Other Ambulatory Visit: Payer: Self-pay | Admitting: Family

## 2022-12-04 DIAGNOSIS — J019 Acute sinusitis, unspecified: Secondary | ICD-10-CM

## 2022-12-04 DIAGNOSIS — E1159 Type 2 diabetes mellitus with other circulatory complications: Secondary | ICD-10-CM

## 2022-12-04 DIAGNOSIS — E1142 Type 2 diabetes mellitus with diabetic polyneuropathy: Secondary | ICD-10-CM

## 2022-12-04 DIAGNOSIS — K219 Gastro-esophageal reflux disease without esophagitis: Secondary | ICD-10-CM

## 2022-12-08 ENCOUNTER — Other Ambulatory Visit: Payer: Self-pay | Admitting: Family

## 2022-12-08 DIAGNOSIS — Z780 Asymptomatic menopausal state: Secondary | ICD-10-CM

## 2022-12-09 ENCOUNTER — Encounter: Payer: Self-pay | Admitting: Family

## 2022-12-09 ENCOUNTER — Ambulatory Visit (INDEPENDENT_AMBULATORY_CARE_PROVIDER_SITE_OTHER): Payer: 59 | Admitting: Family

## 2022-12-09 ENCOUNTER — Ambulatory Visit: Payer: 59

## 2022-12-09 VITALS — BP 153/74 | HR 75 | Temp 97.8°F | Ht 68.0 in | Wt 197.0 lb

## 2022-12-09 DIAGNOSIS — J9611 Chronic respiratory failure with hypoxia: Secondary | ICD-10-CM

## 2022-12-09 DIAGNOSIS — I152 Hypertension secondary to endocrine disorders: Secondary | ICD-10-CM

## 2022-12-09 DIAGNOSIS — I5031 Acute diastolic (congestive) heart failure: Secondary | ICD-10-CM | POA: Diagnosis not present

## 2022-12-09 DIAGNOSIS — E1169 Type 2 diabetes mellitus with other specified complication: Secondary | ICD-10-CM

## 2022-12-09 DIAGNOSIS — N183 Chronic kidney disease, stage 3 unspecified: Secondary | ICD-10-CM

## 2022-12-09 DIAGNOSIS — I5032 Chronic diastolic (congestive) heart failure: Secondary | ICD-10-CM

## 2022-12-09 DIAGNOSIS — K219 Gastro-esophageal reflux disease without esophagitis: Secondary | ICD-10-CM

## 2022-12-09 DIAGNOSIS — E1142 Type 2 diabetes mellitus with diabetic polyneuropathy: Secondary | ICD-10-CM | POA: Diagnosis not present

## 2022-12-09 DIAGNOSIS — E1159 Type 2 diabetes mellitus with other circulatory complications: Secondary | ICD-10-CM | POA: Diagnosis not present

## 2022-12-09 DIAGNOSIS — E039 Hypothyroidism, unspecified: Secondary | ICD-10-CM

## 2022-12-09 DIAGNOSIS — F331 Major depressive disorder, recurrent, moderate: Secondary | ICD-10-CM

## 2022-12-09 DIAGNOSIS — K5901 Slow transit constipation: Secondary | ICD-10-CM

## 2022-12-09 DIAGNOSIS — F419 Anxiety disorder, unspecified: Secondary | ICD-10-CM | POA: Diagnosis not present

## 2022-12-09 DIAGNOSIS — F411 Generalized anxiety disorder: Secondary | ICD-10-CM

## 2022-12-09 LAB — BAYER DCA HB A1C WAIVED: HB A1C (BAYER DCA - WAIVED): 6.5 % — ABNORMAL HIGH (ref 4.8–5.6)

## 2022-12-09 MED ORDER — ESCITALOPRAM OXALATE 5 MG PO TABS: 5.0000 mg | ORAL_TABLET | Freq: Every day | ORAL | 1 refills | Status: DC

## 2022-12-09 NOTE — Patient Instructions (Signed)
Peripheral Edema  Peripheral edema is swelling that is caused by a buildup of fluid. Peripheral edema most often affects the lower legs, ankles, and feet. It can also develop in the arms, hands, and face. The area of the body that has peripheral edema will look swollen. It may also feel heavy or warm. Your clothes may start to feel tight. Pressing on the area may make a temporary dent in your skin (pitting edema). You may not be able to move your swollen arm or leg as much as usual. There are many causes of peripheral edema. It can happen because of a complication of other conditions such as heart failure, kidney disease, or a problem with your circulation. It also can be a side effect of certain medicines or happen because of an infection. It often happens to women during pregnancy. Sometimes, the cause is not known. Follow these instructions at home: Managing pain, stiffness, and swelling  Raise (elevate) your legs while you are sitting or lying down. Move around often to prevent stiffness and to reduce swelling. Do not sit or stand for long periods of time. Do not wear tight clothing. Do not wear garters on your upper legs. Exercise your legs to get your circulation going. This helps to move the fluid back into your blood vessels, and it may help the swelling go down. Wear compression stockings as told by your health care provider. These stockings help to prevent blood clots and reduce swelling in your legs. It is important that these are the correct size. These stockings should be prescribed by your doctor to prevent possible injuries. If elastic bandages or wraps are recommended, use them as told by your health care provider. Medicines Take over-the-counter and prescription medicines only as told by your health care provider. Your health care provider may prescribe medicine to help your body get rid of excess water (diuretic). Take this medicine if you are told to take it. General  instructions Eat a low-salt (low-sodium) diet as told by your health care provider. Sometimes, eating less salt may reduce swelling. Pay attention to any changes in your symptoms. Moisturize your skin daily to help prevent skin from cracking and draining. Keep all follow-up visits. This is important. Contact a health care provider if: You have a fever. You have swelling in only one leg. You have increased swelling, redness, or pain in one or both of your legs. You have drainage or sores at the area where you have edema. Get help right away if: You have edema that starts suddenly or is getting worse, especially if you are pregnant or have a medical condition. You develop shortness of breath, especially when you are lying down. You have pain in your chest or abdomen. You feel weak. You feel like you will faint. These symptoms may be an emergency. Get help right away. Call 911. Do not wait to see if the symptoms will go away. Do not drive yourself to the hospital. Summary Peripheral edema is swelling that is caused by a buildup of fluid. Peripheral edema most often affects the lower legs, ankles, and feet. Move around often to prevent stiffness and to reduce swelling. Do not sit or stand for long periods of time. Pay attention to any changes in your symptoms. Contact a health care provider if you have edema that starts suddenly or is getting worse, especially if you are pregnant or have a medical condition. Get help right away if you develop shortness of breath, especially when lying down.   This information is not intended to replace advice given to you by your health care provider. Make sure you discuss any questions you have with your health care provider. Document Revised: 12/10/2020 Document Reviewed: 12/10/2020 Elsevier Patient Education  2024 [employer]  

## 2022-12-09 NOTE — Progress Notes (Signed)
Subjective:    Patient ID: Lorraine Leonard, female    DOB: 1940/09/05, 82 y.o.   MRN: [id number]  Chief Complaint  Patient presents with   Medical Management of Chronic Issues   Pt presents to the office today for  chronic follow up.    She is followed by Cardiologists every 6 months for CHF & A Fib. She is followed by Pulmonologist annually for pulmonary hypertension, COPD, and Pulmonary Fibrosis. She was told to decrease her oxygen to as needed. Pt is not on oxygen today.  She reports her breathing is stable, but SOB with exertion.    She is followed by Nephrologists as needed for CKD. Avoids NSAID's.  She is followed by Urologists for recurrent UTI's annually.  Hypertension This is a chronic problem. The current episode started more than 1 year ago. The problem has been waxing and waning since onset. The problem is uncontrolled. Associated symptoms include anxiety, blurred vision, malaise/fatigue, peripheral edema and shortness of breath. Risk factors for coronary artery disease include dyslipidemia, diabetes mellitus, obesity and sedentary lifestyle. The current treatment provides moderate improvement. Identifiable causes of hypertension include a thyroid problem.  Congestive Heart Failure Presents for follow-up visit. Associated symptoms include edema, fatigue and shortness of breath. The symptoms have been stable.  Gastroesophageal Reflux She complains of belching and heartburn. This is a chronic problem. The problem occurs occasionally. Associated symptoms include fatigue. Risk factors include obesity. She has tried a PPI for the symptoms. The treatment provided moderate relief.  Diabetes She presents for her follow-up diabetic visit. She has type 2 diabetes mellitus. Hypoglycemia symptoms include nervousness/anxiousness. Associated symptoms include blurred vision, fatigue and foot paresthesias. Symptoms are stable. 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 Risk  factors for coronary artery disease include dyslipidemia, diabetes mellitus, hypertension, sedentary lifestyle and post-menopausal. She is following a generally unhealthy diet. (Not checking glucose at home)  Hyperlipidemia This is a chronic problem. The current episode started more than 1 year ago. Exacerbating diseases include obesity. Associated symptoms include shortness of breath. Current antihyperlipidemic treatment includes statins. The current treatment provides moderate improvement of lipids. Risk factors for coronary artery disease include dyslipidemia, diabetes mellitus, hypertension, a sedentary lifestyle, post-menopausal and obesity.  Thyroid Problem Presents for follow-up visit. Symptoms include anxiety, constipation and fatigue. The symptoms have been stable. Her past medical history is significant for hyperlipidemia.  Arthritis Presents for follow-up visit. She complains of pain and stiffness. Affected locations include the left knee, right knee, left MCP and right MCP. Her pain is at a severity of 5/10. Associated symptoms include fatigue.  Depression        This is a chronic problem.  The current episode started more than 1 year ago.   The problem occurs intermittently.  Associated symptoms include fatigue, helplessness, hopelessness, restlessness and sad.  Past treatments include nothing.  Past medical history includes thyroid problem and anxiety.   Anxiety Presents for follow-up visit. Symptoms include excessive worry, nervous/anxious behavior, restlessness and shortness of breath. Symptoms occur occasionally. The severity of symptoms is moderate.    Constipation This is a chronic problem. The current episode started more than 1 year ago. The problem has been waxing and waning since onset. Risk factors include obesity. She has tried diet changes and stool softeners for the symptoms. The treatment provided moderate relief.      Review of Systems  Constitutional:  Positive for  fatigue and malaise/fatigue.  Eyes:  Positive for blurred vision.  Respiratory:  Positive for shortness of breath.   Gastrointestinal:  Positive for constipation and heartburn.  Musculoskeletal:  Positive for arthritis and stiffness.  Psychiatric/Behavioral:  Positive for depression. The patient is nervous/anxious.   All other systems reviewed and are negative.      Objective:   Physical Exam Vitals reviewed.  Constitutional:      General: She is not in acute distress.    Appearance: She is well-developed. She is obese.  HENT:     Head: Normocephalic and atraumatic.     Right Ear: Tympanic membrane normal.     Left Ear: Tympanic membrane normal.  Eyes:     Pupils: Pupils are equal, round, and reactive to light.  Neck:     Thyroid: No thyromegaly.  Cardiovascular:     Rate and Rhythm: Normal rate and regular rhythm.     Heart sounds: Normal heart sounds. No murmur heard. Pulmonary:     Effort: Pulmonary effort is normal. No respiratory distress.     Breath sounds: Normal breath sounds. No wheezing.  Abdominal:     General: Bowel sounds are normal. There is no distension.     Palpations: Abdomen is soft.     Tenderness: There is no abdominal tenderness.  Musculoskeletal:        General: No tenderness. Normal range of motion.     Cervical back: Normal range of motion and neck supple.     Right lower leg: Edema (2+ in ankle) present.     Left lower leg: Edema (trace) present.  Skin:    General: Skin is warm and dry.  Neurological:     Mental Status: She is alert and oriented to person, place, and time.     Cranial Nerves: No cranial nerve deficit.     Deep Tendon Reflexes: Reflexes are normal and symmetric.  Psychiatric:        Behavior: Behavior normal.        Thought Content: Thought content normal.        Judgment: Judgment normal.      Diabetic Foot Exam - Simple   Simple Foot Form Diabetic Foot exam was performed with the following findings: Yes 12/09/2022  3:17  PM  Visual Inspection See comments: Yes Sensation Testing See comments: Yes Pulse Check Posterior Tibialis and Dorsalis pulse intact bilaterally: Yes Comments Swelling in ankle, toenails thick and long, negative monofilament on great toe and heel        BP (!) 153/74   Pulse 75   Temp 97.8 F (36.6 C) (Temporal)   Ht 5\' 8"  (1.727 m)   Wt 197 lb (89.4 kg)   SpO2 94%   BMI 29.95 kg/m   Assessment & Plan:  Lorraine Leonard comes in today with chief complaint of Medical Management of Chronic Issues   Diagnosis and orders addressed:  1. Anxiety - CMP14+EGFR  2. Acute diastolic CHF (congestive heart failure) (HCC) - CMP14+EGFR  3. Chronic diastolic heart failure (HCC) - CMP14+EGFR  4. Chronic respiratory failure with hypoxia (HCC) - CMP14+EGFR  5. Stage 3 chronic kidney disease, unspecified whether stage 3a or 3b CKD (HCC)  - CMP14+EGFR  6. Slow transit constipation - CMP14+EGFR  7. Moderate episode of recurrent major depressive disorder (HCC) - CMP14+EGFR  8. Gastroesophageal reflux disease, unspecified whether esophagitis present - CMP14+EGFR  9. Hyperlipidemia associated with type 2 diabetes mellitus (HCC) - CMP14+EGFR  10. Hypertension associated with diabetes (HCC) - CMP14+EGFR  11. Hypothyroidism, unspecified type - CMP14+EGFR - TSH  12. Type 2 diabetes mellitus with diabetic polyneuropathy, without long-term current use of insulin (HCC)  - Bayer DCA Hb A1c Waived - CMP14+EGFR - Microalbumin / creatinine urine ratio  13. GAD (generalized anxiety disorder) - escitalopram (LEXAPRO) 5 MG tablet; Take 1 tablet (5 mg total) by mouth daily.  Dispense: 90 tablet; Refill: 1  Labs pending Start Lexapro 5 mg today Continue current medications  Low salt diet  Wear compression hose  Health Maintenance reviewed Diet and exercise encouraged  Follow up plan: 1 months    Jannifer Rodney, FNP

## 2022-12-10 LAB — CMP14+EGFR
ALT: 19 IU/L (ref 0–32)
AST: 25 IU/L (ref 0–40)
Albumin: 4.3 g/dL (ref 3.7–4.7)
Alkaline Phosphatase: 56 IU/L (ref 44–121)
BUN/Creatinine Ratio: 19 (ref 12–28)
BUN: 27 mg/dL (ref 8–27)
Bilirubin Total: 0.3 mg/dL (ref 0.0–1.2)
CO2: 22 mmol/L (ref 20–29)
Calcium: 10.5 mg/dL — ABNORMAL HIGH (ref 8.7–10.3)
Chloride: 99 mmol/L (ref 96–106)
Creatinine, Ser: 1.43 mg/dL — ABNORMAL HIGH (ref 0.57–1.00)
Globulin, Total: 2.9 g/dL (ref 1.5–4.5)
Glucose: 148 mg/dL — ABNORMAL HIGH (ref 70–99)
Potassium: 5.1 mmol/L (ref 3.5–5.2)
Sodium: 141 mmol/L (ref 134–144)
Total Protein: 7.2 g/dL (ref 6.0–8.5)
eGFR: 37 mL/min/{1.73_m2} — ABNORMAL LOW (ref >59)

## 2022-12-10 LAB — MICROALBUMIN / CREATININE URINE RATIO
Creatinine, Urine: 43 mg/dL
Microalb/Creat Ratio: 32 mg/g{creat} — ABNORMAL HIGH (ref 0–29)
Microalbumin, Urine: 13.9 ug/mL

## 2022-12-10 LAB — TSH: TSH: 2.2 u[IU]/mL (ref 0.450–4.500)

## 2022-12-11 ENCOUNTER — Other Ambulatory Visit: Payer: Self-pay | Admitting: Family

## 2022-12-11 MED ORDER — LISINOPRIL 10 MG PO TABS: 10.0000 mg | ORAL_TABLET | Freq: Every day | ORAL | 1 refills | Status: DC

## 2022-12-17 ENCOUNTER — Telehealth: Payer: Self-pay | Admitting: Family

## 2022-12-17 NOTE — Telephone Encounter (Signed)
Spoke with pt's daughter (dpr) explained results to her. She also stated that the pt thinks that her lexapro is making her sick said she feels sick to her stomach, weak, nauseous and wants to know if lexapro is causing this or if it is something else.   Pt's daughter Gwyndolyn Kaufman also requested that we call her instead of pt regarding labs and things like that because pt gets confused on what we are telling her and it makes pt worried.

## 2022-12-18 MED ORDER — ONDANSETRON HCL 4 MG PO TABS: 4.0000 mg | ORAL_TABLET | Freq: Three times a day (TID) | ORAL | 0 refills | Status: DC | PRN

## 2022-12-18 NOTE — Telephone Encounter (Signed)
Continue Lexapro 5 mg at this time and keep follow up with me. I sent in zofran to the pharmacy for nausea to take as needed.   Jannifer Rodney, FNP

## 2022-12-18 NOTE — Telephone Encounter (Signed)
Spoke with pt - she states she dont really feel sick from the lexapro - that it makes her crazy (out of her head) she states she is scared to take it anymore

## 2022-12-18 NOTE — Telephone Encounter (Signed)
Ok this is fine to stop.   Jannifer Rodney, FNP

## 2022-12-23 ENCOUNTER — Ambulatory Visit (INDEPENDENT_AMBULATORY_CARE_PROVIDER_SITE_OTHER): Payer: 59

## 2022-12-23 DIAGNOSIS — I4821 Permanent atrial fibrillation: Secondary | ICD-10-CM

## 2022-12-23 LAB — CUP PACEART REMOTE DEVICE CHECK
Battery Remaining Longevity: 26 mo
Battery Voltage: 2.84 V
Brady Statistic AP VP Percent: 6.22 %
Brady Statistic AP VS Percent: 93.74 %
Brady Statistic AS VP Percent: 0 %
Brady Statistic AS VS Percent: 0.04 %
Brady Statistic RA Percent Paced: 99.96 %
Brady Statistic RV Percent Paced: 6.22 %
Date Time Interrogation Session: 20240902212431
Implantable Lead Connection Status: 753985
Implantable Lead Connection Status: 753985
Implantable Lead Implant Date: 20190128
Implantable Lead Implant Date: 20190128
Implantable Lead Location: 753860
Implantable Lead Location: 753860
Implantable Lead Model: 3830
Implantable Lead Model: 5076
Implantable Pulse Generator Implant Date: 20190128
Lead Channel Impedance Value: 323 Ohm
Lead Channel Impedance Value: 361 Ohm
Lead Channel Impedance Value: 380 Ohm
Lead Channel Impedance Value: 418 Ohm
Lead Channel Sensing Intrinsic Amplitude: 3 mV
Lead Channel Sensing Intrinsic Amplitude: 3 mV
Lead Channel Sensing Intrinsic Amplitude: 8.375 mV
Lead Channel Sensing Intrinsic Amplitude: 8.375 mV
Lead Channel Setting Pacing Amplitude: 2.5 V
Lead Channel Setting Pacing Amplitude: 2.5 V
Lead Channel Setting Pacing Pulse Width: 0.4 ms
Lead Channel Setting Sensing Sensitivity: 0.9 mV
Zone Setting Status: 755011
Zone Setting Status: 755011

## 2022-12-27 DIAGNOSIS — J449 Chronic obstructive pulmonary disease, unspecified: Secondary | ICD-10-CM | POA: Diagnosis not present

## 2022-12-27 DIAGNOSIS — J8410 Pulmonary fibrosis, unspecified: Secondary | ICD-10-CM | POA: Diagnosis not present

## 2022-12-27 DIAGNOSIS — I509 Heart failure, unspecified: Secondary | ICD-10-CM | POA: Diagnosis not present

## 2022-12-30 NOTE — Progress Notes (Signed)
Remote pacemaker transmission.   

## 2022-12-31 ENCOUNTER — Ambulatory Visit: Payer: 59 | Admitting: Nurse Practitioner

## 2023-01-05 ENCOUNTER — Telehealth: Payer: Self-pay | Admitting: Family

## 2023-01-05 NOTE — Telephone Encounter (Signed)
Daughter aware. This is old meds

## 2023-01-05 NOTE — Telephone Encounter (Signed)
I do not see any antibiotic on her current list. She could have been on a low dose to prevent UTIs, I am unsure if she is still taking this or not since it is not on her list.   Does she have the name of the medication?

## 2023-01-05 NOTE — Telephone Encounter (Signed)
Called and spoke with daughter states she has antibiotic that 90 of them with know directions. Did not see on med list will call pharmacy and check. Spoke with [facility] last RX she picked up was cefdienir #14 for 7 days. Do you know anything about a 90 day supply?

## 2023-01-09 ENCOUNTER — Ambulatory Visit (INDEPENDENT_AMBULATORY_CARE_PROVIDER_SITE_OTHER): Payer: 59 | Admitting: Family

## 2023-01-09 ENCOUNTER — Encounter: Payer: Self-pay | Admitting: Family

## 2023-01-09 ENCOUNTER — Ambulatory Visit: Payer: 59

## 2023-01-09 VITALS — BP 135/73 | HR 79 | Temp 97.8°F | Ht 68.0 in | Wt 191.0 lb

## 2023-01-09 DIAGNOSIS — E1159 Type 2 diabetes mellitus with other circulatory complications: Secondary | ICD-10-CM

## 2023-01-09 DIAGNOSIS — R0982 Postnasal drip: Secondary | ICD-10-CM | POA: Diagnosis not present

## 2023-01-09 DIAGNOSIS — F419 Anxiety disorder, unspecified: Secondary | ICD-10-CM | POA: Diagnosis not present

## 2023-01-09 DIAGNOSIS — I152 Hypertension secondary to endocrine disorders: Secondary | ICD-10-CM

## 2023-01-09 DIAGNOSIS — J019 Acute sinusitis, unspecified: Secondary | ICD-10-CM | POA: Diagnosis not present

## 2023-01-09 DIAGNOSIS — Z23 Encounter for immunization: Secondary | ICD-10-CM | POA: Diagnosis not present

## 2023-01-09 DIAGNOSIS — E1142 Type 2 diabetes mellitus with diabetic polyneuropathy: Secondary | ICD-10-CM

## 2023-01-09 MED ORDER — LEVOCETIRIZINE DIHYDROCHLORIDE 5 MG PO TABS
5.0000 mg | ORAL_TABLET | Freq: Every evening | ORAL | 1 refills | Status: DC
Start: 2023-01-09 — End: 2023-08-25

## 2023-01-09 MED ORDER — FLUTICASONE PROPIONATE 50 MCG/ACT NA SUSP
1.0000 | Freq: Two times a day (BID) | NASAL | 1 refills | Status: DC | PRN
Start: 2023-01-09 — End: 2023-09-16

## 2023-01-09 NOTE — Patient Instructions (Signed)
Postnasal Drip Postnasal drip is the feeling of mucus going down the back of your throat. Mucus is a slimy substance that moistens and cleans your nose and throat, as well as the air pockets in face bones near your forehead and cheeks (sinuses). Small amounts of mucus pass from your nose and sinuses down the back of your throat all the time. This is normal. When you produce too much mucus or the mucus gets too thick, you can feel it. Some common causes of postnasal drip include: Having more mucus because of: A cold or the flu. Allergies. Cold air. Certain medicines. Gastroesophageal reflux. Having more mucus that is thicker because of: A sinus or nasal infection. Dry air. A food allergy. Follow these instructions at home: Relieving discomfort  Gargle with a mixture of salt and water 3-4 times a day or as needed. To make salt water, completely dissolve -1 tsp (3-6 g) of salt in 1 cup (237 mL) of warm water. If the air in your home is dry, use a humidifier to add moisture to the air. Use a saline spray or a container (neti pot) to flush out the nose (nasal irrigation). These methods can help clear away mucus and keep the nasal passages moist. General instructions Take over-the-counter and prescription medicines only as told by your health care provider. Follow instructions from your health care provider about eating or drinking restrictions. You may need to avoid caffeine. Avoid things that you know you are allergic to (allergens), like dust, mold, pollen, pets, or certain foods. Drink enough fluid to keep your urine pale yellow. Keep all follow-up visits. This is important. Contact a health care provider if: You have a fever. You have a sore throat or difficulty swallowing. You have a headache. You have sinus or ear pain. You have a cough that does not go away. The mucus from your nose becomes thick and is green or yellow in color. You have cold or flu symptoms that last more than 10  days. Summary Postnasal drip is the feeling of mucus going down the back of your throat. Use nasal irrigation or a nasal spray to help clear away mucus and keep the nasal passages moist. Avoid things that you know you are allergic to (allergens), like dust, mold, pollen, pets, or certain foods. This information is not intended to replace advice given to you by your health care provider. Make sure you discuss any questions you have with your health care provider. Document Revised: 03/07/2021 Document Reviewed: 03/07/2021 Elsevier Patient Education  2024 [employer]  

## 2023-01-09 NOTE — Progress Notes (Signed)
Subjective:    Patient ID: Lorraine Leonard, female    DOB: [birth year], 82 y.o.   MRN: [id number]  Chief Complaint  Patient presents with   Follow-up   PT presents to the office today to follow up on GAD. She was seen on 12/09/22 and started on Lexapro 5 mg. She took it for 2-3 days, but states it made her "feel funny" and stopped it. She reports her anxiety seems better.   She is complaining of post nasal drip.   Her microalbumin was elevated and BP was elevated on last visit. We started her on Lisinopril 10 mg. Her BP is at goal today.  Anxiety Presents for follow-up visit. Symptoms include excessive worry, nervous/anxious behavior, restlessness and shortness of breath. Symptoms occur occasionally. The severity of symptoms is mild.    Hypertension This is a chronic problem. The current episode started more than 1 year ago. The problem has been resolved since onset. The problem is controlled. Associated symptoms include anxiety, malaise/fatigue and shortness of breath. Pertinent negatives include no blurred vision or peripheral edema. Risk factors for coronary artery disease include dyslipidemia, diabetes mellitus, obesity and sedentary lifestyle. Past treatments include ACE inhibitors. The current treatment provides moderate improvement.  Diabetes She presents for her follow-up diabetic visit. She has type 2 diabetes mellitus. Hypoglycemia symptoms include nervousness/anxiousness. Associated symptoms include foot paresthesias. Pertinent negatives for diabetes include no blurred vision. Diabetic complications include peripheral neuropathy. Risk factors for coronary artery disease include dyslipidemia and diabetes mellitus. (Does not check glucose )      Review of Systems  Constitutional:  Positive for malaise/fatigue.  Eyes:  Negative for blurred vision.  Respiratory:  Positive for shortness of breath.   Psychiatric/Behavioral:  The patient is nervous/anxious.        Objective:    Physical Exam Vitals reviewed.  Constitutional:      General: She is not in acute distress.    Appearance: She is well-developed. She is obese.  HENT:     Head: Normocephalic and atraumatic.     Right Ear: External ear normal.  Eyes:     Pupils: Pupils are equal, round, and reactive to light.  Neck:     Thyroid: No thyromegaly.  Cardiovascular:     Rate and Rhythm: Normal rate and regular rhythm.     Heart sounds: Murmur heard.  Pulmonary:     Effort: Pulmonary effort is normal. No respiratory distress.     Breath sounds: Normal breath sounds. No wheezing.  Abdominal:     General: Bowel sounds are normal. There is no distension.     Palpations: Abdomen is soft.     Tenderness: There is no abdominal tenderness.  Musculoskeletal:        General: No tenderness. Normal range of motion.     Cervical back: Normal range of motion and neck supple.  Skin:    General: Skin is warm and dry.  Neurological:     Mental Status: She is alert and oriented to person, place, and time.     Cranial Nerves: No cranial nerve deficit.     Motor: Weakness (using rolling walker) present.     Gait: Gait abnormal.     Deep Tendon Reflexes: Reflexes are normal and symmetric.  Psychiatric:        Behavior: Behavior normal.        Thought Content: Thought content normal.        Judgment: Judgment normal.  BP 135/73   Pulse 79   Temp 97.8 F (36.6 C) (Temporal)   Ht 5\' 8"  (1.727 m)   Wt 191 lb (86.6 kg)   SpO2 90%   BMI 29.04 kg/m      Assessment & Plan:   [name] comes in today with chief complaint of Follow-up   Diagnosis and orders addressed:  1. Anxiety Improved - BMP8+EGFR  2. Hypertension associated with diabetes (HCC) Resolved - BMP8+EGFR  3. Type 2 diabetes mellitus with diabetic polyneuropathy, without long-term current use of insulin (HCC) Stable - BMP8+EGFR  4. Post-nasal drip Continue flonase ans xyzal daily - BMP8+EGFR - fluticasone (FLONASE) 50  MCG/ACT nasal spray; Place 1 spray into both nostrils 2 (two) times daily as needed for allergies or rhinitis.  Dispense: 48 mL; Refill: 1 - levocetirizine (XYZAL) 5 MG tablet; Take 1 tablet (5 mg total) by mouth every evening.  Dispense: 90 tablet; Refill: 1  5. Acute rhinosinusitis - BMP8+EGFR - fluticasone (FLONASE) 50 MCG/ACT nasal spray; Place 1 spray into both nostrils 2 (two) times daily as needed for allergies or rhinitis.  Dispense: 48 mL; Refill: 1   Labs pending Health Maintenance reviewed Diet and exercise encouraged  Follow up plan: Keep chronic follow up in 3 months    Jannifer Rodney, FNP

## 2023-01-10 LAB — BMP8+EGFR
BUN/Creatinine Ratio: 17 (ref 12–28)
BUN: 25 mg/dL (ref 8–27)
CO2: 23 mmol/L (ref 20–29)
Calcium: 10.5 mg/dL — ABNORMAL HIGH (ref 8.7–10.3)
Chloride: 98 mmol/L (ref 96–106)
Creatinine, Ser: 1.44 mg/dL — ABNORMAL HIGH (ref 0.57–1.00)
Glucose: 170 mg/dL — ABNORMAL HIGH (ref 70–99)
Potassium: 5.6 mmol/L — ABNORMAL HIGH (ref 3.5–5.2)
Sodium: 139 mmol/L (ref 134–144)
eGFR: 36 mL/min/{1.73_m2} — ABNORMAL LOW (ref >59)

## 2023-01-12 ENCOUNTER — Other Ambulatory Visit: Payer: Self-pay | Admitting: Family

## 2023-01-12 ENCOUNTER — Other Ambulatory Visit: Payer: Self-pay | Admitting: Family Medicine

## 2023-01-12 DIAGNOSIS — N183 Chronic kidney disease, stage 3 unspecified: Secondary | ICD-10-CM

## 2023-01-13 ENCOUNTER — Other Ambulatory Visit: Payer: 59

## 2023-01-13 DIAGNOSIS — N183 Chronic kidney disease, stage 3 unspecified: Secondary | ICD-10-CM

## 2023-01-14 LAB — CMP14+EGFR
ALT: 22 IU/L (ref 0–32)
AST: 32 IU/L (ref 0–40)
Albumin: 4.2 g/dL (ref 3.7–4.7)
Alkaline Phosphatase: 55 IU/L (ref 44–121)
BUN/Creatinine Ratio: 17 (ref 12–28)
BUN: 23 mg/dL (ref 8–27)
Bilirubin Total: 0.4 mg/dL (ref 0.0–1.2)
CO2: 23 mmol/L (ref 20–29)
Calcium: 9.8 mg/dL (ref 8.7–10.3)
Chloride: 101 mmol/L (ref 96–106)
Creatinine, Ser: 1.35 mg/dL — ABNORMAL HIGH (ref 0.57–1.00)
Globulin, Total: 2.7 g/dL (ref 1.5–4.5)
Glucose: 152 mg/dL — ABNORMAL HIGH (ref 70–99)
Potassium: 5.4 mmol/L — ABNORMAL HIGH (ref 3.5–5.2)
Sodium: 141 mmol/L (ref 134–144)
Total Protein: 6.9 g/dL (ref 6.0–8.5)
eGFR: 39 mL/min/{1.73_m2} — ABNORMAL LOW (ref >59)

## 2023-01-16 ENCOUNTER — Other Ambulatory Visit: Payer: Self-pay | Admitting: Family

## 2023-01-16 DIAGNOSIS — J449 Chronic obstructive pulmonary disease, unspecified: Secondary | ICD-10-CM

## 2023-01-26 DIAGNOSIS — J449 Chronic obstructive pulmonary disease, unspecified: Secondary | ICD-10-CM | POA: Diagnosis not present

## 2023-01-26 DIAGNOSIS — I509 Heart failure, unspecified: Secondary | ICD-10-CM | POA: Diagnosis not present

## 2023-01-26 DIAGNOSIS — J8410 Pulmonary fibrosis, unspecified: Secondary | ICD-10-CM | POA: Diagnosis not present

## 2023-01-27 ENCOUNTER — Encounter: Payer: Self-pay | Admitting: Family

## 2023-01-27 ENCOUNTER — Ambulatory Visit (INDEPENDENT_AMBULATORY_CARE_PROVIDER_SITE_OTHER): Payer: 59 | Admitting: Family

## 2023-01-27 VITALS — BP 126/60 | HR 70 | Temp 97.6°F | Wt 194.0 lb

## 2023-01-27 DIAGNOSIS — E1159 Type 2 diabetes mellitus with other circulatory complications: Secondary | ICD-10-CM | POA: Diagnosis not present

## 2023-01-27 DIAGNOSIS — E875 Hyperkalemia: Secondary | ICD-10-CM | POA: Diagnosis not present

## 2023-01-27 DIAGNOSIS — I152 Hypertension secondary to endocrine disorders: Secondary | ICD-10-CM

## 2023-01-27 NOTE — Progress Notes (Signed)
Subjective:    Patient ID: Lorraine Leonard, female    DOB: [birth year], 82 y.o.   MRN: [id number]  Chief Complaint  Patient presents with   Follow-up   Pt presents to the office today to recheck HTN and hypokalemia. She was seen on last visit and her K+ was 5.6 and 5.4.   Her BP is at goal on second recheck.  Hypertension This is a chronic problem. The current episode started more than 1 year ago. The problem has been resolved since onset. The problem is controlled. Associated symptoms include malaise/fatigue, peripheral edema and shortness of breath. Risk factors for coronary artery disease include dyslipidemia, obesity and sedentary lifestyle. The current treatment provides moderate improvement.      Review of Systems  Constitutional:  Positive for malaise/fatigue.  Respiratory:  Positive for shortness of breath.   All other systems reviewed and are negative.      Objective:   Physical Exam Vitals reviewed.  Constitutional:      General: She is not in acute distress.    Appearance: She is well-developed. She is obese.  HENT:     Head: Normocephalic and atraumatic.     Right Ear: Tympanic membrane normal.     Left Ear: Tympanic membrane normal.  Eyes:     Pupils: Pupils are equal, round, and reactive to light.  Neck:     Thyroid: No thyromegaly.  Cardiovascular:     Rate and Rhythm: Normal rate and regular rhythm.     Heart sounds: Normal heart sounds. No murmur heard. Pulmonary:     Effort: Pulmonary effort is normal. No respiratory distress.     Breath sounds: Normal breath sounds. No wheezing.  Abdominal:     General: Bowel sounds are normal. There is no distension.     Palpations: Abdomen is soft.     Tenderness: There is no abdominal tenderness.  Musculoskeletal:        General: No tenderness. Normal range of motion.     Cervical back: Normal range of motion and neck supple.  Skin:    General: Skin is warm and dry.  Neurological:     Mental Status: She is  alert and oriented to person, place, and time.     Cranial Nerves: No cranial nerve deficit.     Motor: Weakness present.     Gait: Gait abnormal (using rolling walker).     Deep Tendon Reflexes: Reflexes are normal and symmetric.  Psychiatric:        Behavior: Behavior normal.        Thought Content: Thought content normal.        Judgment: Judgment normal.         BP 126/60   Pulse 70   Temp 97.6 F (36.4 C) (Temporal)   Wt 194 lb (88 kg)   SpO2 98%   BMI 29.50 kg/m   Assessment & Plan:  KANANI ERNEY comes in today with chief complaint of Follow-up   Diagnosis and orders addressed:  1. Hypertension associated with diabetes (HCC) At goal - BMP8+EGFR  2. Hyperkalemia Labs pending  Continue to hold lisinopril  - BMP8+EGFR   Labs pending Health Maintenance reviewed Diet and exercise encouraged  Follow up plan: 3 months   Jannifer Rodney, FNP

## 2023-01-27 NOTE — Patient Instructions (Signed)
Hyperkalemia Hyperkalemia occurs when the level of potassium in the blood is too high. Potassium is an important mineral (electrolyte) that helps the muscles and nerves function normally. It affects how the heart works, and it helps keep fluids and minerals balanced in the body. If there is too much potassium in your blood, it can affect your heart's ability to function normally. Potassium is normally removed (excreted) from the body by the kidneys. Hyperkalemia can result from various conditions. It can range from mild to severe. What are the causes? This condition may be caused by: Taking in too much potassium. This can happen if: You use salt substitutes. They contain large amounts of potassium. You take potassium supplements. You eat too many foods that are high in potassium if you have kidney disease. Excreting too little potassium. This can happen if: Your kidneys are not working properly. Kidney (renal) disease, including short-term or long-term renal failure, is a common cause of hyperkalemia. You are taking medicines that lower your excretion of potassium, such as ACE inhibitors, angiotensin II receptor blockers (ARBs), or potassium-sparing diuretics, such as spironolactone. You have Addison's disease. You have a urinary tract blockage, such as kidney stones. You are on treatment to mechanically clean your blood (dialysis) and you skip a treatment. Your cells releasing a high amount of potassium into the blood. This can happen with: Injury to muscles (rhabdomyolysis) or other tissues. Most potassium is stored in your muscles. Severe burns, injuries, or infections. Acidic blood plasma (acidosis). Acidosis can result from many diseases, such as uncontrolled diabetes. What increases the risk? You are more likely to develop this condition if you have alcoholism or if you use drugs heavily. What are the signs or symptoms? In many cases, there are no symptoms. However, when your potassium  level becomes high enough, you may have symptoms such as: An irregular or very slow heartbeat. Nausea. Tiredness (fatigue). Confusion. Tingling of your skin or numbness of your hands or feet. Muscle cramps. Muscle weakness. Not being able to move (paralysis). How is this diagnosed? This condition may be diagnosed based on: Your symptoms and medical history. Your health care provider will ask about your use of over-the-counter and prescription medicines. A physical exam. Blood tests. An electrocardiogram (ECG). How is this treated? Treatment depends on the cause and severity of your condition. Treatment may need to be done in the hospital setting. Treatment may include: Receiving a sugar solution (glucose) through an IV along with insulin to shift potassium out of your blood and into your cells. Taking a medicine called albuterol to shift potassium out of your blood and into your cells. Taking medicines to remove the potassium from your body. Having dialysis to remove the potassium from your body. Taking calcium to protect your heart from the effects of high potassium, such as irregular rhythms (arrhythmias). Follow these instructions at home:  Take over-the-counter and prescription medicines only as told by your health care provider. Do not take any supplements, natural food products, herbs, or vitamins without reviewing them with your health care provider. Certain supplements and natural food products contain high amounts of potassium. If you drink alcohol, limit how much you have as told by your health care provider. Do not use illegal drugs. If you need help quitting, ask your health care provider. If you have kidney disease, you may need to follow a low-potassium diet. A dietitian can help you learn which foods have high or low amounts of potassium. Keep all follow-up visits. This is important.  Contact a health care provider if: You have an irregular or very slow heartbeat. You  feel light-headed. You feel weak. You are nauseous. You have tingling or numbness in your hands or feet. Get help right away if: You have shortness of breath. You have chest pain or discomfort. You faint. You have muscle paralysis. These symptoms may be an emergency. Get help right away. Call 911. Do not wait to see if the symptoms will go away. Do not drive yourself to the hospital. Summary Hyperkalemia occurs when the level of potassium in your blood is too high. This condition may be caused by taking in too much potassium, excreting too little potassium, or releasing a high amount of potassium from your cells into your blood. Hyperkalemia can result from many underlying conditions, especially chronic kidney disease, or from taking certain medicines. Treatment of hyperkalemia may include medicine to shift potassium out of your blood and into your cells or to remove the potassium from your body. If you have kidney disease, you may need to follow a low-potassium diet. A dietitian can help you learn which foods have high or low amounts of potassium. This information is not intended to replace advice given to you by your health care provider. Make sure you discuss any questions you have with your health care provider. Document Revised: 12/20/2020 Document Reviewed: 12/20/2020 Elsevier Patient Education  2024 [employer].

## 2023-01-28 LAB — BMP8+EGFR
BUN/Creatinine Ratio: 18 (ref 12–28)
BUN: 20 mg/dL (ref 8–27)
CO2: 22 mmol/L (ref 20–29)
Calcium: 10 mg/dL (ref 8.7–10.3)
Chloride: 104 mmol/L (ref 96–106)
Creatinine, Ser: 1.11 mg/dL — ABNORMAL HIGH (ref 0.57–1.00)
Glucose: 134 mg/dL — ABNORMAL HIGH (ref 70–99)
Potassium: 5.3 mmol/L — ABNORMAL HIGH (ref 3.5–5.2)
Sodium: 143 mmol/L (ref 134–144)
eGFR: 50 mL/min/{1.73_m2} — ABNORMAL LOW (ref >59)

## 2023-02-19 ENCOUNTER — Ambulatory Visit: Payer: 59 | Attending: Nurse Practitioner | Admitting: Nurse Practitioner

## 2023-02-19 ENCOUNTER — Encounter: Payer: Self-pay | Admitting: Nurse Practitioner

## 2023-02-19 VITALS — BP 122/70 | HR 89 | Ht 67.0 in | Wt 192.2 lb

## 2023-02-19 DIAGNOSIS — I5032 Chronic diastolic (congestive) heart failure: Secondary | ICD-10-CM | POA: Diagnosis not present

## 2023-02-19 DIAGNOSIS — J8410 Pulmonary fibrosis, unspecified: Secondary | ICD-10-CM

## 2023-02-19 DIAGNOSIS — R0609 Other forms of dyspnea: Secondary | ICD-10-CM | POA: Diagnosis not present

## 2023-02-19 DIAGNOSIS — I4821 Permanent atrial fibrillation: Secondary | ICD-10-CM

## 2023-02-19 DIAGNOSIS — N183 Chronic kidney disease, stage 3 unspecified: Secondary | ICD-10-CM | POA: Diagnosis not present

## 2023-02-19 DIAGNOSIS — Z950 Presence of cardiac pacemaker: Secondary | ICD-10-CM

## 2023-02-19 NOTE — Progress Notes (Signed)
Office Visit    Patient Name: Lorraine Leonard Date of Encounter: 02/19/2023 PCP:  Junie Spencer, FNP [facility] Cardiologist:  Dina Rich, MD  Advanced Practice Provider:  No care team member to display Electrophysiologist:  Lewayne Bunting, MD   Chief Complaint and HPI    Lorraine Leonard is a 82 y.o. 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  Pt is closely followed by EP.   Seen by Ronie Spies, PA-C on May 27, 2022.  She noted transient nosebleed x 2 episodes.  She noted increased shortness of breath at night, reported to be a mouth breather.  Had chronic mild right ankle edema, was unchanged.  Did note generalized fatigue and occasional abdominal discomfort at times. Echo updated with stable findings - result noted below.   I saw her on September 25, 2022. Her chief concern was that she "gave out" more easily.  Not able to walk as far as she could before without getting short of breath.  Weight stable, lost around another 5 pounds since April 2024. Note acute injury to right lower foot due to her dog.  Admitted to rare episodes of chest pain, described as transient "tingling" sensation, denied any exertional symptoms, lasts only few seconds per her report. Labs revealed elevated BNP, offered to increase Lasix to improve symptoms, pt declined.   11/06/2022 - Today she presents for follow-up. Continues to note worsening shortness of breath. States she is only taking Lasix 40 mg daily instead of 40 mg BID that is listed on medication list. Continues to note right lower ankle swelling that is stable, has not seen podiatry yet. Family member states her toe discoloration has resolved, but ankle remains swollen. Weight is stable. Denies  any chest pain, palpitations, syncope, presyncope, dizziness, orthopnea, PND, significant weight changes, acute bleeding, or claudication. Has recently received treatment for UTI.   02/19/2023 - Presents for follow-up. Overall doing well. Continuing to take Lasix 40 mg BID. Denies any chest pain, palpitations, syncope, presyncope, dizziness, orthopnea, PND, swelling or significant weight changes, acute bleeding, or claudication. Notes stable DOE. Compliant with medications and tolerating well.   EKGs/Labs/Other Studies Reviewed:   The following studies were reviewed today:   EKG:  EKG is not ordered today.   Echo 07/2022: 1. Left ventricular ejection fraction, by estimation, is 65 to 70%. The  left ventricle has normal function. The left ventricle has no regional  wall motion abnormalities. There is mild left ventricular hypertrophy.  Left ventricular diastolic function  could not be evaluated.   2. Right ventricular systolic function is normal. The right ventricular  size is normal. There is mildly elevated pulmonary artery systolic  pressure. The estimated right ventricular systolic pressure is 38.8 mmHg.   3. Left atrial size was mild to moderately dilated.   4. Right atrial size was mildly dilated.   5. The mitral valve is normal in structure. Mild mitral valve  regurgitation. No evidence of mitral stenosis.   6. The aortic valve is tricuspid. There is mild calcification of the  aortic valve. Aortic valve regurgitation is mild. No aortic stenosis is  present.   7. Aortic dilatation noted. There is borderline dilatation of the aortic  root, measuring 38 mm.   8. The inferior vena cava is  normal in size with greater than 50%  respiratory variability, suggesting right atrial pressure of 3 mmHg.   Comparison(s): No significant change from prior study. Stable mild TR,  mild AI.  Risk Assessment/Calculations:   CHA2DS2-VASc Score = 5  This indicates a 7.2% annual risk of  stroke. The patient's score is based upon: CHF History: 1 HTN History: 1 Diabetes History: 0 Stroke History: 0 Vascular Disease History: 0 Age Score: 2 Gender Score: 1   Review of Systems    All other systems reviewed and are otherwise negative except as noted above.  Physical Exam    VS:  BP 122/70   Pulse 89   Ht 5\' 7"  (1.702 m)   Wt 192 lb 3.2 oz (87.2 kg)   SpO2 92%   BMI 30.10 kg/m  , BMI Body mass index is 30.1 kg/m.  Wt Readings from Last 3 Encounters:  02/19/23 192 lb 3.2 oz (87.2 kg)  01/27/23 194 lb (88 kg)  01/09/23 191 lb (86.6 kg)    GEN: Well nourished, well developed, in no acute distress. HEENT: normal. Neck: Supple, no JVD, carotid bruits, or masses. Cardiac: S1/S2, RRR, no murmurs, rubs, or gallops. No clubbing, cyanosis.  No edema noted to BLE. Radials 2+/PT 1+ and equal bilaterally.  Respiratory:  Respirations regular and unlabored, clear to auscultation bilaterally. MS: No deformity or atrophy.  Skin: Warm and dry, no rash. Neuro:  Strength and sensation are intact. Psych: Normal affect.  Assessment & Plan    Chronic HFpEF Stage C, NYHA class II symptoms. Euvolemic and well compensated on exam. Weight stable. Echo 07/2022 revealed EF 65-70%. Continue atenolol, Lasix, and Aldactone.  Low sodium diet, fluid restriction <2L, and daily weights encouraged. Educated to contact our office for weight gain of 2 lbs overnight or 5 lbs in one week.   2. Permanent A-fib, s/p AVN ablation and PPM Denies any tachycardia or palpitations. Recent remote device check revealed normal device function. No medication changes at this time. Continue Eliquis for ChadsVasc score of 5, on appropriate dosage. Continue to follow-up with EP.   4. CKD stage III Most recent kidney function stable. Labs being managed by PCP. Avoid nephrotoxic agents. Encouraged adequate hydration. Continue to follow with PCP.  5. DOE, pulmonary fibrosis Symptoms appear to be related to  pulmonary fibrosis and HFpEF, symptoms are stable. No medication changes at this time. Continue to follow-up with pulmonology. ED precautions discussed.   Disposition: Follow up in 3 months with Dina Rich, MD or APP.  Signed, Sharlene Dory, NP

## 2023-02-19 NOTE — Patient Instructions (Addendum)

## 2023-02-26 DIAGNOSIS — J8410 Pulmonary fibrosis, unspecified: Secondary | ICD-10-CM | POA: Diagnosis not present

## 2023-02-26 DIAGNOSIS — J449 Chronic obstructive pulmonary disease, unspecified: Secondary | ICD-10-CM | POA: Diagnosis not present

## 2023-02-26 DIAGNOSIS — I509 Heart failure, unspecified: Secondary | ICD-10-CM | POA: Diagnosis not present

## 2023-02-28 ENCOUNTER — Other Ambulatory Visit: Payer: Self-pay | Admitting: Family

## 2023-02-28 DIAGNOSIS — E1142 Type 2 diabetes mellitus with diabetic polyneuropathy: Secondary | ICD-10-CM

## 2023-03-04 ENCOUNTER — Other Ambulatory Visit: Payer: Self-pay | Admitting: Family

## 2023-03-06 ENCOUNTER — Encounter: Payer: Self-pay | Admitting: Family

## 2023-03-06 ENCOUNTER — Other Ambulatory Visit: Payer: Self-pay | Admitting: Family

## 2023-03-06 ENCOUNTER — Ambulatory Visit (INDEPENDENT_AMBULATORY_CARE_PROVIDER_SITE_OTHER): Payer: 59 | Admitting: Family

## 2023-03-06 VITALS — BP 139/77 | HR 50 | Temp 97.7°F | Ht 67.0 in | Wt 187.0 lb

## 2023-03-06 DIAGNOSIS — N390 Urinary tract infection, site not specified: Secondary | ICD-10-CM | POA: Diagnosis not present

## 2023-03-06 DIAGNOSIS — R300 Dysuria: Secondary | ICD-10-CM

## 2023-03-06 DIAGNOSIS — R399 Unspecified symptoms and signs involving the genitourinary system: Secondary | ICD-10-CM

## 2023-03-06 DIAGNOSIS — J449 Chronic obstructive pulmonary disease, unspecified: Secondary | ICD-10-CM

## 2023-03-06 LAB — MICROSCOPIC EXAMINATION
Renal Epithel, UA: NONE SEEN /[HPF]
WBC, UA: >30 /[HPF] — AB (ref 0–5)
Yeast, UA: NONE SEEN

## 2023-03-06 LAB — URINALYSIS, COMPLETE
Bilirubin, UA: NEGATIVE
Glucose, UA: NEGATIVE
Ketones, UA: NEGATIVE
Nitrite, UA: NEGATIVE
Specific Gravity, UA: 1.01 (ref 1.005–1.030)
Urobilinogen, Ur: 0.2 mg/dL (ref 0.2–1.0)
pH, UA: 6 (ref 5.0–7.5)

## 2023-03-06 MED ORDER — CEPHALEXIN 500 MG PO CAPS
500.0000 mg | ORAL_CAPSULE | Freq: Two times a day (BID) | ORAL | 0 refills | Status: DC
Start: 2023-03-06 — End: 2023-05-22

## 2023-03-06 NOTE — Patient Instructions (Signed)
Urinary Tract Infection, Adult  A urinary tract infection (UTI) is an infection of any part of the urinary tract. The urinary tract includes the kidneys, ureters, bladder, and urethra. These organs make, store, and get rid of urine in the body. An upper UTI affects the ureters and kidneys. A lower UTI affects the bladder and urethra. What are the causes? Most urinary tract infections are caused by bacteria in your genital area around your urethra, where urine leaves your body. These bacteria grow and cause inflammation of your urinary tract. What increases the risk? You are more likely to develop this condition if: You have a urinary catheter that stays in place. You are not able to control when you urinate or have a bowel movement (incontinence). You are female and you: Use a spermicide or diaphragm for birth control. Have low estrogen levels. Are pregnant. You have certain genes that increase your risk. You are sexually active. You take antibiotic medicines. You have a condition that causes your flow of urine to slow down, such as: An enlarged prostate, if you are female. Blockage in your urethra. A kidney stone. A nerve condition that affects your bladder control (neurogenic bladder). Not getting enough to drink, or not urinating often. You have certain medical conditions, such as: Diabetes. A weak disease-fighting system (immunesystem). Sickle cell disease. Gout. Spinal cord injury. What are the signs or symptoms? Symptoms of this condition include: Needing to urinate right away (urgency). Frequent urination. This may include small amounts of urine each time you urinate. Pain or burning with urination. Blood in the urine. Urine that smells bad or unusual. Trouble urinating. Cloudy urine. Vaginal discharge, if you are female. Pain in the abdomen or the lower back. You may also have: Vomiting or a decreased appetite. Confusion. Irritability or tiredness. A fever or  chills. Diarrhea. The first symptom in older adults may be confusion. In some cases, they may not have any symptoms until the infection has worsened. How is this diagnosed? This condition is diagnosed based on your medical history and a physical exam. You may also have other tests, including: Urine tests. Blood tests. Tests for STIs (sexually transmitted infections). If you have had more than one UTI, a cystoscopy or imaging studies may be done to determine the cause of the infections. How is this treated? Treatment for this condition includes: Antibiotic medicine. Over-the-counter medicines to treat discomfort. Drinking enough water to stay hydrated. If you have frequent infections or have other conditions such as a kidney stone, you may need to see a health care provider who specializes in the urinary tract (urologist). In rare cases, urinary tract infections can cause sepsis. Sepsis is a life-threatening condition that occurs when the body responds to an infection. Sepsis is treated in the hospital with IV antibiotics, fluids, and other medicines. Follow these instructions at home:  Medicines Take over-the-counter and prescription medicines only as told by your health care provider. If you were prescribed an antibiotic medicine, take it as told by your health care provider. Do not stop using the antibiotic even if you start to feel better. General instructions Make sure you: Empty your bladder often and completely. Do not hold urine for long periods of time. Empty your bladder after sex. Wipe from front to back after urinating or having a bowel movement if you are female. Use each tissue only one time when you wipe. Drink enough fluid to keep your urine pale yellow. Keep all follow-up visits. This is important. Contact a health   care provider if: Your symptoms do not get better after 1-2 days. Your symptoms go away and then return. Get help right away if: You have severe pain in  your back or your lower abdomen. You have a fever or chills. You have nausea or vomiting. Summary A urinary tract infection (UTI) is an infection of any part of the urinary tract, which includes the kidneys, ureters, bladder, and urethra. Most urinary tract infections are caused by bacteria in your genital area. Treatment for this condition often includes antibiotic medicines. If you were prescribed an antibiotic medicine, take it as told by your health care provider. Do not stop using the antibiotic even if you start to feel better. Keep all follow-up visits. This is important. This information is not intended to replace advice given to you by your health care provider. Make sure you discuss any questions you have with your health care provider. Document Revised: 11/13/2019 Document Reviewed: 11/18/2019 Elsevier Patient Education  2024 Elsevier Inc.  

## 2023-03-06 NOTE — Progress Notes (Signed)
Subjective:    Patient ID: Lorraine Leonard, female    DOB: 16-Feb-1941, 82 y.o.   MRN: [id number]  Chief Complaint  Patient presents with   Urinary Tract Infection   Pt presents to the office today for dysuria that started two weeks ago.  Dysuria  This is a new problem. The current episode started 1 to 4 weeks ago. The problem occurs intermittently. The problem has been gradually worsening. The quality of the pain is described as burning. The pain is at a severity of 10/10. The pain is mild. Associated symptoms include frequency, hesitancy and urgency. Pertinent negatives include no flank pain, hematuria, nausea or vomiting. She has tried increased fluids for the symptoms. The treatment provided mild relief.      Review of Systems  Gastrointestinal:  Negative for nausea and vomiting.  Genitourinary:  Positive for dysuria, frequency, hesitancy and urgency. Negative for flank pain and hematuria.  All other systems reviewed and are negative.      Objective:   Physical Exam Vitals reviewed.  Constitutional:      General: She is not in acute distress.    Appearance: She is well-developed. She is obese.  HENT:     Head: Normocephalic and atraumatic.  Eyes:     Pupils: Pupils are equal, round, and reactive to light.  Neck:     Thyroid: No thyromegaly.  Cardiovascular:     Rate and Rhythm: Normal rate and regular rhythm.     Heart sounds: Normal heart sounds. No murmur heard. Pulmonary:     Effort: Pulmonary effort is normal. No respiratory distress.     Breath sounds: Normal breath sounds. No wheezing.  Abdominal:     General: Bowel sounds are normal. There is no distension.     Palpations: Abdomen is soft.     Tenderness: There is no abdominal tenderness.     Comments: No tenderness noted  Musculoskeletal:        General: No tenderness. Normal range of motion.     Cervical back: Normal range of motion and neck supple.  Skin:    General: Skin is warm and dry.  Neurological:      Mental Status: She is alert and oriented to person, place, and time.     Cranial Nerves: No cranial nerve deficit.     Deep Tendon Reflexes: Reflexes are normal and symmetric.  Psychiatric:        Behavior: Behavior normal.        Thought Content: Thought content normal.        Judgment: Judgment normal.    BP 139/77   Pulse (!) 50   Temp 97.7 F (36.5 C) (Temporal)   Ht 5\' 7"  (1.702 m)   Wt 187 lb (84.8 kg)   SpO2 95%   BMI 29.29 kg/m       Assessment & Plan:  Lorraine Leonard comes in today with chief complaint of Urinary Tract Infection   Diagnosis and orders addressed:  1. Dysuria - Urinalysis, Complete - Urine Culture - cephALEXin (KEFLEX) 500 MG capsule; Take 1 capsule (500 mg total) by mouth 2 (two) times daily.  Dispense: 14 capsule; Refill: 0  2. UTI symptoms Force fluids AZO over the counter X2 days RTO prn Culture pending Follow up if symptoms worsen or do not improve  - cephALEXin (KEFLEX) 500 MG capsule; Take 1 capsule (500 mg total) by mouth 2 (two) times daily.  Dispense: 14 capsule; Refill: 0    Lamarcus Spira  Lendon Colonel, FNP

## 2023-03-07 ENCOUNTER — Other Ambulatory Visit: Payer: Self-pay | Admitting: Family

## 2023-03-07 DIAGNOSIS — I152 Hypertension secondary to endocrine disorders: Secondary | ICD-10-CM

## 2023-03-10 LAB — URINE CULTURE

## 2023-03-24 ENCOUNTER — Telehealth: Payer: Self-pay | Admitting: *Deleted

## 2023-03-24 ENCOUNTER — Ambulatory Visit (INDEPENDENT_AMBULATORY_CARE_PROVIDER_SITE_OTHER): Payer: 59

## 2023-03-24 DIAGNOSIS — I4891 Unspecified atrial fibrillation: Secondary | ICD-10-CM

## 2023-03-24 LAB — CUP PACEART REMOTE DEVICE CHECK
Battery Remaining Longevity: 23 mo
Battery Voltage: 2.82 V
Brady Statistic AP VP Percent: 4.27 %
Brady Statistic AP VS Percent: 95.72 %
Brady Statistic AS VP Percent: 0 %
Brady Statistic AS VS Percent: 0.01 %
Brady Statistic RA Percent Paced: 99.99 %
Brady Statistic RV Percent Paced: 4.27 %
Date Time Interrogation Session: 20241202213033
Implantable Lead Connection Status: 753985
Implantable Lead Connection Status: 753985
Implantable Lead Implant Date: 20190128
Implantable Lead Implant Date: 20190128
Implantable Lead Location: 753860
Implantable Lead Location: 753860
Implantable Lead Model: 3830
Implantable Lead Model: 5076
Implantable Pulse Generator Implant Date: 20190128
Lead Channel Impedance Value: 304 Ohm
Lead Channel Impedance Value: 342 Ohm
Lead Channel Impedance Value: 399 Ohm
Lead Channel Impedance Value: 418 Ohm
Lead Channel Sensing Intrinsic Amplitude: 3.75 mV
Lead Channel Sensing Intrinsic Amplitude: 3.75 mV
Lead Channel Sensing Intrinsic Amplitude: 7.375 mV
Lead Channel Sensing Intrinsic Amplitude: 7.375 mV
Lead Channel Setting Pacing Amplitude: 2.5 V
Lead Channel Setting Pacing Amplitude: 2.5 V
Lead Channel Setting Pacing Pulse Width: 0.4 ms
Lead Channel Setting Sensing Sensitivity: 0.9 mV
Zone Setting Status: 755011
Zone Setting Status: 755011

## 2023-03-24 NOTE — Patient Outreach (Signed)
  Care Coordination   03/24/2023  Name: Lorraine Leonard MRN: [id number] DOB: 11-04-1940   Care Coordination Outreach Attempts:  An unsuccessful telephone outreach was attempted today to offer the patient information about available care coordination services. HIPAA compliant messages left on voicemail providing contact information for CSW, encouraging patient to return CSW's call at her earliest convenience.  Follow Up Plan:  Additional outreach attempts will be made to offer the patient care coordination information and services.   Encounter Outcome:  No Answer.   Care Coordination Interventions:  No, not indicated.    Danford Bad, BSW, MSW, Printmaker Social Work Case Set designer Health  [facility], Population Health Direct Dial: [phone]  Fax: [phone] Email: [email or web address]: [email or web address]

## 2023-03-28 DIAGNOSIS — J8410 Pulmonary fibrosis, unspecified: Secondary | ICD-10-CM | POA: Diagnosis not present

## 2023-03-28 DIAGNOSIS — J449 Chronic obstructive pulmonary disease, unspecified: Secondary | ICD-10-CM | POA: Diagnosis not present

## 2023-03-28 DIAGNOSIS — I509 Heart failure, unspecified: Secondary | ICD-10-CM | POA: Diagnosis not present

## 2023-03-30 ENCOUNTER — Telehealth: Payer: Self-pay | Admitting: *Deleted

## 2023-03-30 NOTE — Patient Outreach (Signed)
  Care Coordination   03/30/2023  Name: Lorraine Leonard MRN: [id number] DOB: [birth year]   Care Coordination Outreach Attempts:  A second unsuccessful outreach was attempted today to offer the patient with information about available care coordination services. HIPAA compliant messages left on voicemail providing contact information for CSW, encouraging patient to return CSW's call at her earliest convenience.  Follow Up Plan:  Additional outreach attempts will be made to offer the patient care coordination information and services.   Encounter Outcome:  No Answer.   Care Coordination Interventions:  No, not indicated.    Danford Bad, BSW, MSW, Printmaker Social Work Case Set designer Health  [facility], Population Health Direct Dial: [phone]  Fax: [phone] Email: [email or web address]: [email or web address]

## 2023-04-08 ENCOUNTER — Telehealth: Payer: Self-pay | Admitting: *Deleted

## 2023-04-08 ENCOUNTER — Other Ambulatory Visit: Payer: Self-pay | Admitting: Family

## 2023-04-08 DIAGNOSIS — E1159 Type 2 diabetes mellitus with other circulatory complications: Secondary | ICD-10-CM

## 2023-04-08 NOTE — Patient Outreach (Signed)
  Care Coordination   04/08/2023  Name: Lorraine Leonard MRN: [id number] DOB: 05-29-1940   Care Coordination Outreach Attempts:  A third unsuccessful outreach was attempted today to offer the patient with information about available complex care management services. HIPAA compliant messages left on voicemail providing contact information for CSW, encouraging patient to return CSW's call at her earliest convenience.  Follow Up Plan:  No further outreach attempts will be made at this time. We have been unable to contact the patient to offer or enroll patient in complex care management services.  Encounter Outcome:  No Answer.   Care Coordination Interventions:  No, not indicated.    Danford Bad, BSW, MSW, Printmaker Social Work Case Set designer Health  [facility], Population Health Direct Dial: [phone]  Fax: [phone] Email: [email or web address] Website: [email or web address]

## 2023-04-11 ENCOUNTER — Other Ambulatory Visit: Payer: Self-pay | Admitting: Family

## 2023-04-11 DIAGNOSIS — K219 Gastro-esophageal reflux disease without esophagitis: Secondary | ICD-10-CM

## 2023-04-13 ENCOUNTER — Other Ambulatory Visit: Payer: Self-pay | Admitting: Family

## 2023-04-28 DIAGNOSIS — I509 Heart failure, unspecified: Secondary | ICD-10-CM | POA: Diagnosis not present

## 2023-04-28 DIAGNOSIS — J449 Chronic obstructive pulmonary disease, unspecified: Secondary | ICD-10-CM | POA: Diagnosis not present

## 2023-04-28 DIAGNOSIS — J8410 Pulmonary fibrosis, unspecified: Secondary | ICD-10-CM | POA: Diagnosis not present

## 2023-05-11 ENCOUNTER — Other Ambulatory Visit: Payer: Self-pay | Admitting: Family

## 2023-05-11 DIAGNOSIS — K219 Gastro-esophageal reflux disease without esophagitis: Secondary | ICD-10-CM

## 2023-05-14 ENCOUNTER — Other Ambulatory Visit: Payer: Self-pay | Admitting: Family

## 2023-05-22 ENCOUNTER — Ambulatory Visit: Payer: 59 | Attending: Nurse Practitioner | Admitting: Nurse Practitioner

## 2023-05-22 ENCOUNTER — Ambulatory Visit (INDEPENDENT_AMBULATORY_CARE_PROVIDER_SITE_OTHER): Payer: 59 | Admitting: Family Medicine

## 2023-05-22 ENCOUNTER — Encounter: Payer: Self-pay | Admitting: Nurse Practitioner

## 2023-05-22 ENCOUNTER — Encounter: Payer: Self-pay | Admitting: Family Medicine

## 2023-05-22 VITALS — BP 150/69 | HR 80 | Temp 98.2°F | Ht 67.0 in | Wt 189.0 lb

## 2023-05-22 VITALS — BP 130/62 | HR 78 | Ht 67.0 in | Wt 190.0 lb

## 2023-05-22 DIAGNOSIS — R399 Unspecified symptoms and signs involving the genitourinary system: Secondary | ICD-10-CM

## 2023-05-22 DIAGNOSIS — I4821 Permanent atrial fibrillation: Secondary | ICD-10-CM | POA: Diagnosis not present

## 2023-05-22 DIAGNOSIS — N183 Chronic kidney disease, stage 3 unspecified: Secondary | ICD-10-CM

## 2023-05-22 DIAGNOSIS — I5032 Chronic diastolic (congestive) heart failure: Secondary | ICD-10-CM | POA: Diagnosis not present

## 2023-05-22 DIAGNOSIS — E1159 Type 2 diabetes mellitus with other circulatory complications: Secondary | ICD-10-CM

## 2023-05-22 DIAGNOSIS — R0609 Other forms of dyspnea: Secondary | ICD-10-CM

## 2023-05-22 DIAGNOSIS — Z1331 Encounter for screening for depression: Secondary | ICD-10-CM | POA: Diagnosis not present

## 2023-05-22 DIAGNOSIS — Z79899 Other long term (current) drug therapy: Secondary | ICD-10-CM

## 2023-05-22 DIAGNOSIS — K5900 Constipation, unspecified: Secondary | ICD-10-CM | POA: Diagnosis not present

## 2023-05-22 DIAGNOSIS — E875 Hyperkalemia: Secondary | ICD-10-CM

## 2023-05-22 DIAGNOSIS — Z950 Presence of cardiac pacemaker: Secondary | ICD-10-CM

## 2023-05-22 DIAGNOSIS — J8410 Pulmonary fibrosis, unspecified: Secondary | ICD-10-CM | POA: Diagnosis not present

## 2023-05-22 LAB — MICROSCOPIC EXAMINATION
Epithelial Cells (non renal): NONE SEEN /[HPF] (ref 0–10)
Renal Epithel, UA: NONE SEEN /[HPF]
WBC, UA: >30 /[HPF] — AB (ref 0–5)
Yeast, UA: NONE SEEN

## 2023-05-22 LAB — URINALYSIS, ROUTINE W REFLEX MICROSCOPIC
Bilirubin, UA: NEGATIVE
Glucose, UA: NEGATIVE
Ketones, UA: NEGATIVE
Nitrite, UA: NEGATIVE
Specific Gravity, UA: 1.015 (ref 1.005–1.030)
Urobilinogen, Ur: 0.2 mg/dL (ref 0.2–1.0)
pH, UA: 6 (ref 5.0–7.5)

## 2023-05-22 MED ORDER — PHENAZOPYRIDINE HCL 200 MG PO TABS: 200.0000 mg | ORAL_TABLET | Freq: Three times a day (TID) | ORAL | 0 refills | Status: AC | PRN

## 2023-05-22 MED ORDER — SULFAMETHOXAZOLE-TRIMETHOPRIM 800-160 MG PO TABS: 1.0000 | ORAL_TABLET | Freq: Two times a day (BID) | ORAL | 0 refills | Status: DC

## 2023-05-22 NOTE — Patient Instructions (Addendum)
[facility] for medications  [facility] for BP monitor   Monitoring your BP at home   Your BP was elevated today in office  Please keep a log of your BP at home.  The best time to take BP is 1st thing in the morning after waking.  Sit for 5 minutes with feet flat on the floor, arm at heart level.  Options for BP cuffs are at [employer], [employer], [facility], [facility] & [facility].  We will review measurements at follow up and determine plan for BP management.  If you have access, you can send a message in MyChart with your measurements prior to your follow up appointment.  The brand I recommend to get is Omron (Bronze).  Check for 1-2 weeks and send measurements to [facility]     Thank you for coming in to clinic today.  1. Your symptoms are consistent with Constipation, likely cause of your General Abdominal Pain / Cramping. 2. Start with Miralax.. First dose 68g (4 capfuls) in 32oz water over 1 to 2 hours for clean out. Next day start 17g or 1 capful daily, may adjust dose up or down by half a capful every few days. Recommend to take this medicine daily for next 1-2 weeks, you may need to use it longer if needed. - Goal is to have soft regular bowel movement 1-3x daily, if too runny or diarrhea, then reduce dose of the medicine to every other day.  Improve water intake, hydration will help Also recommend increased vegetables, fruits, fiber intake Can try daily Metamucil or Fiber supplement at pharmacy over the counter  Follow-up if symptoms are not improving with bowel movements, or if pain worsens, develop fevers, nausea, vomiting.  Please schedule a follow-up appointment in 1 month to follow-up Constipation  If you have any other questions or concerns, please feel free to call the clinic to contact me. You may also schedule an earlier appointment if necessary.  However, if your symptoms get significantly worse, please go to the Emergency Department to seek immediate medical  attention.

## 2023-05-22 NOTE — Patient Instructions (Addendum)
Medication Instructions:   Continue all current medications.   Labwork:  BMET, BNP, Mg - orders given today Please do in 1 week Office will contact with results via phone, letter or mychart.     Testing/Procedures:  none  Follow-Up:  4-6 weeks    Any Other Special Instructions Will Be Listed Below (If Applicable).  CHF info given   If you need a refill on your cardiac medications before your next appointment, please call your pharmacy.

## 2023-05-22 NOTE — Progress Notes (Unsigned)
Office Visit    Patient Name: Lorraine Leonard Date of Encounter: 02/19/2023 PCP:  Junie Spencer, FNP [facility] Cardiologist:  Dina Rich, MD  Advanced Practice Provider:  No care team member to display Electrophysiologist:  Lewayne Bunting, MD   Chief Complaint and HPI    Lorraine Leonard is a 83 y.o. 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  Pt is closely followed by EP.   Seen by Ronie Spies, PA-C on May 27, 2022.  She noted transient nosebleed x 2 episodes.  She noted increased shortness of breath at night, reported to be a mouth breather.  Had chronic mild right ankle edema, was unchanged.  Did note generalized fatigue and occasional abdominal discomfort at times. Echo updated with stable findings - result noted below.   I saw her on September 25, 2022. Her chief concern was that she "gave out" more easily.  Not able to walk as far as she could before without getting short of breath.  Weight stable, lost around another 5 pounds since April 2024. Note acute injury to right lower foot due to her dog.  Admitted to rare episodes of chest pain, described as transient "tingling" sensation, denied any exertional symptoms, lasts only few seconds per her report. Labs revealed elevated BNP, offered to increase Lasix to improve symptoms, pt declined.   11/06/2022 - Today she presents for follow-up. Continues to note worsening shortness of breath. States she is only taking Lasix 40 mg daily instead of 40 mg BID that is listed on medication list. Continues to note right lower ankle swelling that is stable, has not seen podiatry yet. Family member states her toe discoloration has resolved, but ankle remains swollen. Weight is stable. Denies  any chest pain, palpitations, syncope, presyncope, dizziness, orthopnea, PND, significant weight changes, acute bleeding, or claudication. Has recently received treatment for UTI.   02/19/2023 - Presents for follow-up. Overall doing well. Continuing to take Lasix 40 mg BID. Denies any chest pain, palpitations, syncope, presyncope, dizziness, orthopnea, PND, swelling or significant weight changes, acute bleeding, or claudication. Notes stable DOE. Compliant with medications and tolerating well.   EKGs/Labs/Other Studies Reviewed:   The following studies were reviewed today:   EKG:  EKG is not ordered today.   Echo 07/2022: 1. Left ventricular ejection fraction, by estimation, is 65 to 70%. The  left ventricle has normal function. The left ventricle has no regional  wall motion abnormalities. There is mild left ventricular hypertrophy.  Left ventricular diastolic function  could not be evaluated.   2. Right ventricular systolic function is normal. The right ventricular  size is normal. There is mildly elevated pulmonary artery systolic  pressure. The estimated right ventricular systolic pressure is 38.8 mmHg.   3. Left atrial size was mild to moderately dilated.   4. Right atrial size was mildly dilated.   5. The mitral valve is normal in structure. Mild mitral valve  regurgitation. No evidence of mitral stenosis.   6. The aortic valve is tricuspid. There is mild calcification of the  aortic valve. Aortic valve regurgitation is mild. No aortic stenosis is  present.   7. Aortic dilatation noted. There is borderline dilatation of the aortic  root, measuring 38 mm.   8. The inferior vena cava is  normal in size with greater than 50%  respiratory variability, suggesting right atrial pressure of 3 mmHg.   Comparison(s): No significant change from prior study. Stable mild TR,  mild AI.  Risk Assessment/Calculations:   CHA2DS2-VASc Score = 5  This indicates a 7.2% annual risk of  stroke. The patient's score is based upon: CHF History: 1 HTN History: 1 Diabetes History: 0 Stroke History: 0 Vascular Disease History: 0 Age Score: 2 Gender Score: 1   Review of Systems    All other systems reviewed and are otherwise negative except as noted above.  Physical Exam    VS:  There were no vitals taken for this visit. , BMI There is no height or weight on file to calculate BMI.  Wt Readings from Last 3 Encounters:  05/22/23 189 lb (85.7 kg)  03/06/23 187 lb (84.8 kg)  02/19/23 192 lb 3.2 oz (87.2 kg)    GEN: Well nourished, well developed, in no acute distress. HEENT: normal. Neck: Supple, no JVD, carotid bruits, or masses. Cardiac: S1/S2, RRR, no murmurs, rubs, or gallops. No clubbing, cyanosis.  No edema noted to BLE. Radials 2+/PT 1+ and equal bilaterally.  Respiratory:  Respirations regular and unlabored, clear to auscultation bilaterally. MS: No deformity or atrophy.  Skin: Warm and dry, no rash. Neuro:  Strength and sensation are intact. Psych: Normal affect.  Assessment & Plan    Chronic HFpEF Stage C, NYHA class II symptoms. Euvolemic and well compensated on exam. Weight stable. Echo 07/2022 revealed EF 65-70%. Continue atenolol, Lasix, and Aldactone.  Low sodium diet, fluid restriction <2L, and daily weights encouraged. Educated to contact our office for weight gain of 2 lbs overnight or 5 lbs in one week.   2. Permanent A-fib, s/p AVN ablation and PPM Denies any tachycardia or palpitations. Recent remote device check revealed normal device function. No medication changes at this time. Continue Eliquis for ChadsVasc score of 5, on appropriate dosage. Continue to follow-up with EP.   4. CKD stage III Most recent kidney function stable. Labs being managed by PCP. Avoid nephrotoxic agents. Encouraged adequate hydration. Continue to follow with PCP.  5. DOE, pulmonary fibrosis Symptoms appear to be related to pulmonary fibrosis and HFpEF, symptoms are  stable. No medication changes at this time. Continue to follow-up with pulmonology. ED precautions discussed.   Disposition: Follow up in 3 months with Dina Rich, MD or APP.  Signed, Sharlene Dory, NP

## 2023-05-22 NOTE — Progress Notes (Signed)
Subjective:  Patient ID: Lorraine Leonard, female    DOB: [birth year], 83 y.o.   MRN: [id number]  Patient Care Team: Junie Spencer, FNP as PCP - General (Family Medicine) Marinus Maw, MD as PCP - Electrophysiology (Cardiology) Wyline Mood Dorothe Pea, MD as PCP - Cardiology (Cardiology) Nyoka Cowden, MD as Consulting Physician (Pulmonary Disease) Marcine Matar, MD as Consulting Physician (Urology) Delora Fuel, [place] (Optometry)   Chief Complaint:  Urinary Tract Infection (Vaginal pain)   HPI: Lorraine Leonard is a 83 y.o. female presenting on 05/22/2023 for Urinary Tract Infection (Vaginal pain)  Urinary Tract Infection    1. UTI symptoms States that she has had symptoms off and on for a few weeks.  Suprapubic pain. States that it hurts all the time, but worse with urination. Reports hesitancy,  She has increased frequency. Denies fever, N/V, itching, hematuria, itching, or discharge. Taking tylenol as needed for pain. States that she took a few doses of an antibiotic that she had at home.   Constipation  States that she has been constipated and is using miralax off and on. States that it helps some but not fully   Hypertension  She is unsure if she has a monitor at home. She is willing to check her BP but does not know if she can operate an upper arm monitor. She is seeing cardiology later today.   Relevant past medical, surgical, family, and social history reviewed and updated as indicated.  Allergies and medications reviewed and updated. Data reviewed: Chart in Epic.   Past Medical History:  Diagnosis Date   Allergic rhinitis    Anxiety    Anxiety    Arthritis    "~ all my joints" (05/18/2017)   Atrial fibrillation (HCC) 09/2016   Atrial fibrillation with RVR (HCC) 05/18/2017   Chronic heart failure with preserved ejection fraction (HFpEF) (HCC)    Chronic kidney disease, stage 3a (HCC)    Chronic lower back pain    COPD (chronic obstructive pulmonary disease)  (HCC)    Depression    Diverticulosis    GERD (gastroesophageal reflux disease)    Glaucoma    Gout    History of blood transfusion 1983   "when I gave a 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"   Hyperlipidemia    Hypertension    Hypothyroidism    Macular degeneration of both eyes    Neuropathy    On home oxygen therapy    "3L; 24/7" (05/18/2017)   Pre-diabetes    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   Pulmonary fibrosis (HCC)    Rheumatoid arthritis (HCC)    Sleep apnea    uses 3 liters of o2 24/7   Urticaria     Past Surgical History:  Procedure Laterality Date   AV NODE ABLATION  05/18/2017   AV NODE ABLATION N/A 05/18/2017   Procedure: AV NODE ABLATION;  Surgeon: Marinus Maw, MD;  Location: [facility];  Service: Cardiovascular;  Laterality: N/A;   BACK SURGERY     BREAST LUMPECTOMY Left    CARDIOVERSION N/A 01/30/2017   Procedure: CARDIOVERSION;  Surgeon: Wendall Stade, MD;  Location: [facility];  Service: Cardiovascular;  Laterality: N/A;   CARDIOVERSION N/A 04/09/2017   Procedure: CARDIOVERSION;  Surgeon: Jonelle Sidle, MD;  Location: [facility];  Service: Cardiovascular;  Laterality: N/A;   CATARACT EXTRACTION W/ INTRAOCULAR LENS  IMPLANT, BILATERAL Bilateral    DILATION AND CURETTAGE OF UTERUS  EUS N/A 10/03/2015   Procedure: UPPER ENDOSCOPIC ULTRASOUND (EUS) RADIAL;  Surgeon: Willis Modena, MD;  Location: [facility];  Service: Endoscopy;  Laterality: N/A;   FINE NEEDLE ASPIRATION N/A 10/03/2015   Procedure: FINE NEEDLE ASPIRATION (FNA) RADIAL;  Surgeon: Willis Modena, MD;  Location: [facility];  Service: Endoscopy;  Laterality: N/A;   INSERT / REPLACE / REMOVE PACEMAKER  05/18/2017   LUMBAR LAMINECTOMY  1995; 06/23/2008   L4-5/notes 08/20/2010   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   NEPHRECTOMY TRANSPLANTED ORGAN     PACEMAKER IMPLANT N/A 05/18/2017   Procedure: PACEMAKER IMPLANT;  Surgeon: Marinus Maw, MD;   Location: [facility];  Service: Cardiovascular;  Laterality: N/A;   TOTAL ABDOMINAL HYSTERECTOMY     TUBAL LIGATION      Social History   Socioeconomic History   Marital status: Widowed    Spouse name: Reality Dejonge   Number of children: 4   Years of education: 9   Highest education level: 9th grade  Occupational History   Occupation: retired    Comment: cna  Tobacco Use   Smoking status: Never    Passive exposure: Never   Smokeless tobacco: Never  Vaping Use   Vaping status: Never Used  Substance and Sexual Activity   Alcohol use: No   Drug use: No   Sexual activity: Not Currently    Partners: Male    Birth control/protection: Surgical  Other Topics Concern   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   Pt lives alone in single wide trailer. They have 4 children and 13 grandchildren.    Social Drivers of Corporate investment banker Strain: Low Risk  (03/28/2022)   Overall Financial Resource Strain (CARDIA)    Difficulty of Paying Living Expenses: Not hard at all  Food Insecurity: No Food Insecurity (03/28/2022)   Hunger Vital Sign    Worried About Running Out of Food in the Last Year: Never true    Ran Out of Food in the Last Year: Never true  Transportation Needs: No Transportation Needs (03/28/2022)   PRAPARE - Administrator, Civil Service (Medical): No    Lack of Transportation (Non-Medical): No  Physical Activity: Insufficiently Active (03/28/2022)   Exercise Vital Sign    Days of Exercise per Week: 3 days    Minutes of Exercise per Session: 30 min  Stress: No Stress Concern Present (03/28/2022)   [employer] of Occupational Health - Occupational Stress Questionnaire    Feeling of Stress : Not at all  Social Connections: Moderately Isolated (03/28/2022)   Social Connection and Isolation Panel [NHANES]    Frequency of Communication with Friends and Family: More than three  times a week    Frequency of Social Gatherings with Friends and Family: More than three times a week    Attends Religious Services: 1 to 4 times per year    Active Member of [employer] or Organizations: No    Attends Banker Meetings: Never    Marital Status: Widowed  Intimate Partner Violence: Not At Risk (03/28/2022)   Humiliation, Afraid, Rape, and Kick questionnaire    Fear of Current or Ex-Partner: No    Emotionally Abused: No    Physically Abused: No    Sexually Abused: No    Outpatient Encounter Medications as of 05/22/2023  Medication Sig  albuterol (VENTOLIN HFA) 108 (90 Base) MCG/ACT inhaler INHALE 2 PUFFS INTO THE LUNGS EVERY 6 HOURS AS NEEDED FOR WHEEZE OR SHORTNESS OF BREATH   apixaban (ELIQUIS) 5 MG TABS tablet Take 1 tablet (5 mg total) by mouth 2 (two) times daily. **NEEDS TO BE SEEN BEFORE NEXT REFILL**   atenolol (TENORMIN) 25 MG tablet TAKE 1 TABLET (25 MG TOTAL) BY MOUTH DAILY.   calcium citrate (CALCITRATE - DOSED IN MG ELEMENTAL CALCIUM) 950 MG tablet Take 1 tablet by mouth 2 (two) times daily.    cyanocobalamin 1000 MCG tablet Take 1,000 mcg by mouth daily. Vitamin b12 every AM   diclofenac Sodium (VOLTAREN) 1 % GEL APPLY 4 GRAMS TOPICALLY 4 TIMES A DAY   estradiol (ESTRACE VAGINAL) 0.1 MG/GM vaginal cream Place 1 Applicatorful vaginally at bedtime.   fluticasone (FLONASE) 50 MCG/ACT nasal spray Place 1 spray into both nostrils 2 (two) times daily as needed for allergies or rhinitis.   furosemide (LASIX) 40 MG tablet TAKE 1 TABLET BY MOUTH TWICE A DAY   levocetirizine (XYZAL) 5 MG tablet Take 1 tablet (5 mg total) by mouth every evening.   levothyroxine (SYNTHROID) 100 MCG tablet TAKE 1 TABLET BY MOUTH EVERY DAY   metFORMIN (GLUCOPHAGE-XR) 500 MG 24 hr tablet TAKE 1 TABLET BY MOUTH EVERY DAY WITH BREAKFAST   Multiple Vitamins-Minerals (PRESERVISION AREDS 2) CAPS Take 1 capsule by mouth 2 (two) times daily.   Olopatadine HCl 0.2 % SOLN INSTILL 1 DROP INTO  BOTH EYES EVERY DAY   omeprazole (PRILOSEC) 20 MG capsule Take 1 capsule (20 mg total) by mouth daily. **NEEDS TO BE SEEN BEFORE NEXT REFILL**   ondansetron (ZOFRAN) 4 MG tablet Take 1 tablet (4 mg total) by mouth every 8 (eight) hours as needed for nausea or vomiting.   OXYGEN Inhale 3 L into the lungs every evening.   Polyethyl Glycol-Propyl Glycol (SYSTANE) 0.4-0.3 % GEL ophthalmic gel Place 1 application into both eyes at bedtime as needed (dry eyes).   predniSONE (DELTASONE) 5 MG tablet Take 1 tablet (5 mg total) by mouth 2 (two) times daily with a meal.   rosuvastatin (CRESTOR) 10 MG tablet TAKE 1 TABLET BY MOUTH EVERYDAY AT BEDTIME   spironolactone (ALDACTONE) 25 MG tablet TAKE 1 TABLET (25 MG TOTAL) BY MOUTH DAILY.   vitamin C (ASCORBIC ACID) 500 MG tablet Take 1,000 mg by mouth daily. AM   Vitamin D, Ergocalciferol, (DRISDOL) 1.25 MG (50000 UNIT) CAPS capsule TAKE 1 CAPSULE (50,000 UNITS TOTAL) BY MOUTH EVERY 7 (SEVEN) DAYS   [DISCONTINUED] cephALEXin (KEFLEX) 500 MG capsule Take 1 capsule (500 mg total) by mouth 2 (two) times daily.   No facility-administered encounter medications on file as of 05/22/2023.    Allergies  Allergen Reactions   Allopurinol Swelling    Feet and legs swell   Mobic [Meloxicam] Swelling and Other (See Comments)    Feet and Leg swelling   Macrodantin [Nitrofurantoin Macrocrystal]     Review of Systems  Objective:  BP (!) 150/69   Pulse 80   Temp 98.2 F (36.8 C)   Ht 5\' 7"  (1.702 m)   Wt 189 lb (85.7 kg)   SpO2 96%   BMI 29.60 kg/m    Wt Readings from Last 3 Encounters:  05/22/23 189 lb (85.7 kg)  03/06/23 187 lb (84.8 kg)  02/19/23 192 lb 3.2 oz (87.2 kg)    Physical Exam Constitutional:      General: She is awake. She is not in  acute distress.    Appearance: Normal appearance. She is well-developed, well-groomed and overweight. She is not ill-appearing, toxic-appearing or diaphoretic.  Cardiovascular:     Rate and Rhythm: Normal rate  and regular rhythm.     Pulses: Normal pulses.          Radial pulses are 2+ on the right side and 2+ on the left side.       Posterior tibial pulses are 2+ on the right side and 2+ on the left side.     Heart sounds: Normal heart sounds. No murmur heard.    No gallop.  Pulmonary:     Effort: Pulmonary effort is normal. No respiratory distress.     Breath sounds: Normal breath sounds. No stridor. No wheezing, rhonchi or rales.  Abdominal:     General: Abdomen is flat. Bowel sounds are normal. There is no distension.     Palpations: There is no mass.     Tenderness: There is abdominal tenderness in the suprapubic area. There is no guarding or rebound.     Hernia: No hernia is present.  Musculoskeletal:     Cervical back: Full passive range of motion without pain and neck supple.     Right lower leg: No edema.     Left lower leg: No edema.     Comments: Generalized weakness, using rollator   Skin:    General: Skin is warm.     Capillary Refill: Capillary refill takes less than 2 seconds.  Neurological:     General: No focal deficit present.     Mental Status: She is alert, oriented to person, place, and time and easily aroused. Mental status is at baseline.     GCS: GCS eye subscore is 4. GCS verbal subscore is 5. GCS motor subscore is 6.     Motor: No weakness.  Psychiatric:        Attention and Perception: Attention and perception normal.        Mood and Affect: Mood and affect normal.        Speech: Speech normal.        Behavior: Behavior normal. Behavior is cooperative.        Thought Content: Thought content normal. Thought content does not include homicidal or suicidal ideation. Thought content does not include homicidal or suicidal plan.        Cognition and Memory: Cognition and memory normal.        Judgment: Judgment normal.     Results for orders placed or performed in visit on 03/24/23  CUP PACEART REMOTE DEVICE CHECK   Collection Time: 03/23/23  9:30 PM  Result  Value Ref Range   Date Time Interrogation Session [id number]    Pulse Generator Manufacturer MERM    Pulse Gen Model [id number] Azure XT DR MRI    Pulse Gen Serial Number [id number]    Clinic Name [facility]    Implantable Pulse Generator Type Implantable Pulse Generator    Implantable Pulse Generator Implant Date 40981191    Implantable Lead Manufacturer [facility]    Implantable Lead Model 3830 SelectSecure    Implantable Lead Serial Number [id number]    Implantable Lead Implant Date [id number]    Implantable Lead Location Detail 1 UNKNOWN    Implantable Lead Special Function BUNDLE OF HIS    Implantable Lead Location [id number]    Implantable Lead Connection Status [id number]    Implantable Lead Manufacturer MERM    Implantable Lead Model 5076 CapSureFix Novus MRI  SureScan    Implantable Lead Serial Number [id number]    Implantable Lead Implant Date [id number]    Implantable Lead Location Detail 1 APEX    Implantable Lead Location [id number]    Implantable Lead Connection Status [id number]    Lead Channel Setting Sensing Sensitivity 0.9 mV   Lead Channel Setting Pacing Amplitude 2.5 V   Lead Channel Setting Pacing Pulse Width 0.4 ms   Lead Channel Setting Pacing Amplitude 2.5 V   Zone Setting Status [id number]    Zone Setting Status [phone]    Lead Channel Impedance Value 399 ohm   Lead Channel Impedance Value 304 ohm   Lead Channel Sensing Intrinsic Amplitude 3.75 mV   Lead Channel Sensing Intrinsic Amplitude 3.75 mV   Lead Channel Impedance Value 418 ohm   Lead Channel Impedance Value 342 ohm   Lead Channel Sensing Intrinsic Amplitude 7.375 mV   Lead Channel Sensing Intrinsic Amplitude 7.375 mV   Battery Status OK    Battery Remaining Longevity 23 mo   Battery Voltage 2.82 V   Brady Statistic RA Percent Paced 99.99 %   Brady Statistic RV Percent Paced 4.27 %   Brady Statistic AP VP Percent 4.27 %   Brady Statistic AS VP Percent 0 %   Brady Statistic AP VS Percent 95.72 %   Brady Statistic AS  VS Percent 0.01 %   *Note: Due to a large number of results and/or encounters for the requested time period, some results have not been displayed. A complete set of results can be found in Results Review.       05/22/2023   10:55 AM 12/09/2022    2:46 PM 10/30/2022    2:03 PM 03/28/2022   10:39 AM 02/25/2022    3:27 PM  Depression screen PHQ 2/9  Decreased Interest 1 0 0 0 0  Down, Depressed, Hopeless 0 0 0 0 0  PHQ - 2 Score 1 0 0 0 0  Altered sleeping 1 0 0 0 0  Tired, decreased energy 3 0 0 0 0  Change in appetite 0 0 0 0 2  Feeling bad or failure about yourself  1 0 0 0 0  Trouble concentrating 3 0 0 0 0  Moving slowly or fidgety/restless   0 0 0  Suicidal thoughts 0 0 0 0 0  PHQ-9 Score 9 0 0 0 2  Difficult doing work/chores Very difficult Not difficult at all Not difficult at all Not difficult at all Not difficult at all       10/30/2022    2:03 PM 02/25/2022    3:27 PM 11/12/2021    1:51 PM 08/06/2021    2:08 PM  GAD 7 : Generalized Anxiety Score  Nervous, Anxious, on Edge 0 1 1 2   Control/stop worrying 0 0 0 2  Worry too much - different things 0 0 0 2  Trouble relaxing 0 0 0 0  Restless 0 0 0 0  Easily annoyed or irritable 0 0 2 1  Afraid - awful might happen 0 0 0 0  Total GAD 7 Score 0 1 3 7   Anxiety Difficulty Not difficult at all Not difficult at all Not difficult at all Somewhat difficult      Pertinent labs & imaging results that were available during my care of the patient were reviewed by me and considered in my medical decision making.  Assessment & Plan:  Calinda was seen today for urinary tract infection.  Diagnoses and all orders  for this visit:  UTI symptoms Based on UA, will send in antibiotic as below. Will await culture to determine next steps. Sent in pyridium as below for symptom management. Recommend increasing hydration and monitoring for red flag symptoms.  -     Urinalysis, Routine w reflex microscopic -     Urine Culture -      sulfamethoxazole-trimethoprim (BACTRIM DS) 800-160 MG tablet; Take 1 tablet by mouth 2 (two) times daily. -     phenazopyridine (PYRIDIUM) 200 MG tablet; Take 1 tablet (200 mg total) by mouth 3 (three) times daily as needed for up to 2 days for pain (urinary).  Hypertension associated with diabetes (HCC) Elevated BP in office today. Patient to monitor BP at home and report measurements to PCP and Cardiology. Ordered monitor as below as she is not sure where her monitor is.  -     For home use only DME Other see comment  Constipation, unspecified constipation type Discussed bowel cleanout. Provided handout. Discussed with patient to maintain hydration and to titrate dose as needed.   Encounter for screening for depression Positive screening for depression today. Negative for SI. Recommend patient follow up with PCP.   Other orders Unable to collect. If symptoms continue, will complete further evaluation.  -     Cancel: WET PREP FOR TRICH, YEAST, CLUE     Continue all other maintenance medications.  Follow up plan: Return if symptoms worsen or fail to improve.   Continue healthy lifestyle choices, including diet (rich in fruits, vegetables, and lean proteins, and low in salt and simple carbohydrates) and exercise (at least 30 minutes of moderate physical activity daily).  Written and verbal instructions provided   The above assessment and management plan was discussed with the patient. The patient verbalized understanding of and has agreed to the management plan. Patient is aware to call the clinic if they develop any new symptoms or if symptoms persist or worsen. Patient is aware when to return to the clinic for a follow-up visit. Patient educated on when it is appropriate to go to the emergency department.   Neale Burly, DNP-FNP [facility] [address] [place], [place] [address] [phone]

## 2023-05-28 ENCOUNTER — Telehealth: Payer: Self-pay

## 2023-05-28 ENCOUNTER — Encounter: Payer: Self-pay | Admitting: Family Medicine

## 2023-05-28 LAB — URINE CULTURE

## 2023-05-28 NOTE — Telephone Encounter (Signed)
 Patient informed. LS

## 2023-05-28 NOTE — Telephone Encounter (Signed)
 Copied from CRM [id number]. Topic: Clinical - Lab/Test Results >> May 28, 2023 11:12 AM Blair Bumpers wrote: Reason for CRM: Pt's daughter, Felecia Hopper, returning call from nurse in regards to labs. Please give her a call back. Cb #: [id number].

## 2023-05-29 ENCOUNTER — Other Ambulatory Visit: Payer: Self-pay | Admitting: Family

## 2023-05-29 DIAGNOSIS — J8410 Pulmonary fibrosis, unspecified: Secondary | ICD-10-CM | POA: Diagnosis not present

## 2023-05-29 DIAGNOSIS — J449 Chronic obstructive pulmonary disease, unspecified: Secondary | ICD-10-CM | POA: Diagnosis not present

## 2023-05-29 DIAGNOSIS — I509 Heart failure, unspecified: Secondary | ICD-10-CM | POA: Diagnosis not present

## 2023-05-29 DIAGNOSIS — E1142 Type 2 diabetes mellitus with diabetic polyneuropathy: Secondary | ICD-10-CM

## 2023-05-29 MED ORDER — SULFAMETHOXAZOLE-TRIMETHOPRIM 800-160 MG PO TABS: 1.0000 | ORAL_TABLET | Freq: Two times a day (BID) | ORAL | 0 refills | Status: AC

## 2023-05-31 ENCOUNTER — Other Ambulatory Visit: Payer: Self-pay | Admitting: Family

## 2023-05-31 DIAGNOSIS — F411 Generalized anxiety disorder: Secondary | ICD-10-CM

## 2023-05-31 DIAGNOSIS — F331 Major depressive disorder, recurrent, moderate: Secondary | ICD-10-CM

## 2023-06-03 ENCOUNTER — Other Ambulatory Visit: Payer: Self-pay | Admitting: Family

## 2023-06-03 DIAGNOSIS — K219 Gastro-esophageal reflux disease without esophagitis: Secondary | ICD-10-CM

## 2023-06-04 ENCOUNTER — Other Ambulatory Visit: Payer: Self-pay | Admitting: Family

## 2023-06-08 ENCOUNTER — Ambulatory Visit: Payer: 59 | Attending: Internal Medicine | Admitting: Internal Medicine

## 2023-06-08 ENCOUNTER — Encounter: Payer: Self-pay | Admitting: Internal Medicine

## 2023-06-08 VITALS — BP 148/82 | HR 77 | Ht 67.0 in | Wt 183.4 lb

## 2023-06-08 DIAGNOSIS — I4819 Other persistent atrial fibrillation: Secondary | ICD-10-CM | POA: Diagnosis not present

## 2023-06-08 LAB — CUP PACEART INCLINIC DEVICE CHECK
Date Time Interrogation Session: 20250217141349
Implantable Lead Connection Status: 753985
Implantable Lead Connection Status: 753985
Implantable Lead Implant Date: 20190128
Implantable Lead Implant Date: 20190128
Implantable Lead Location: 753860
Implantable Lead Location: 753860
Implantable Lead Model: 3830
Implantable Lead Model: 5076
Implantable Pulse Generator Implant Date: 20190128

## 2023-06-08 NOTE — Progress Notes (Signed)
HPI [name] returns today for followup. She is a chronically ill 83 yo woman with a h/o chronic diastolic heart failure, uncontrolled atrial fib s/p AV node ablation and PPM insertion. She notes that she has had gradually improved. Her fatigue and dyspnea remain but have gotten better. She is sedentary but not quite as much as she was. She has not lost any more weight. She notes that she feels ok. She is using her walker to get around. No falls.  Allergies  Allergen Reactions   Allopurinol Swelling    Feet and legs swell   Mobic [Meloxicam] Swelling and Other (See Comments)    Feet and Leg swelling   Macrodantin [Nitrofurantoin Macrocrystal]      Current Outpatient Medications  Medication Sig Dispense Refill   albuterol (VENTOLIN HFA) 108 (90 Base) MCG/ACT inhaler INHALE 2 PUFFS INTO THE LUNGS EVERY 6 HOURS AS NEEDED FOR WHEEZE OR SHORTNESS OF BREATH 8.5 each 4   apixaban (ELIQUIS) 5 MG TABS tablet Take 1 tablet (5 mg total) by mouth 2 (two) times daily. **NEEDS TO BE SEEN BEFORE NEXT REFILL** 60 tablet 0   atenolol (TENORMIN) 25 MG tablet TAKE 1 TABLET (25 MG TOTAL) BY MOUTH DAILY. 90 tablet 1   calcium citrate (CALCITRATE - DOSED IN MG ELEMENTAL CALCIUM) 950 MG tablet Take 1 tablet by mouth 2 (two) times daily.      cyanocobalamin 1000 MCG tablet Take 1,000 mcg by mouth daily. Vitamin b12 every AM     diclofenac Sodium (VOLTAREN) 1 % GEL APPLY 4 GRAMS TOPICALLY 4 TIMES A DAY 400 g 1   estradiol (ESTRACE VAGINAL) 0.1 MG/GM vaginal cream Place 1 Applicatorful vaginally at bedtime. 42.5 g 12   fluticasone (FLONASE) 50 MCG/ACT nasal spray Place 1 spray into both nostrils 2 (two) times daily as needed for allergies or rhinitis. 48 mL 1   furosemide (LASIX) 40 MG tablet TAKE 1 TABLET BY MOUTH TWICE A DAY 180 tablet 0   levocetirizine (XYZAL) 5 MG tablet Take 1 tablet (5 mg total) by mouth every evening. 90 tablet 1   levothyroxine (SYNTHROID) 100 MCG tablet TAKE 1 TABLET BY MOUTH  EVERY DAY 90 tablet 0   metFORMIN (GLUCOPHAGE-XR) 500 MG 24 hr tablet Take 1 tablet (500 mg total) by mouth daily with breakfast. **NEEDS TO BE SEEN BEFORE NEXT REFILL** 30 tablet 0   Multiple Vitamins-Minerals (PRESERVISION AREDS 2) CAPS Take 1 capsule by mouth 2 (two) times daily.     Olopatadine HCl 0.2 % SOLN INSTILL 1 DROP INTO BOTH EYES EVERY DAY 10 mL 5   omeprazole (PRILOSEC) 20 MG capsule Take 1 capsule (20 mg total) by mouth daily. 90 capsule 2   ondansetron (ZOFRAN) 4 MG tablet Take 1 tablet (4 mg total) by mouth every 8 (eight) hours as needed for nausea or vomiting. 20 tablet 0   OXYGEN Inhale 3 L into the lungs every evening.     Polyethyl Glycol-Propyl Glycol (SYSTANE) 0.4-0.3 % GEL ophthalmic gel Place 1 application into both eyes at bedtime as needed (dry eyes).     predniSONE (DELTASONE) 5 MG tablet Take 1 tablet (5 mg total) by mouth 2 (two) times daily with a meal. 180 tablet 2   rosuvastatin (CRESTOR) 10 MG tablet TAKE 1 TABLET BY MOUTH EVERYDAY AT BEDTIME **NEEDS TO BE SEEN BEFORE NEXT REFILL** 30 tablet 0   spironolactone (ALDACTONE) 25 MG tablet Take 1 tablet (25 mg total) by mouth daily. **NEEDS TO  BE SEEN BEFORE NEXT REFILL** 30 tablet 0   vitamin C (ASCORBIC ACID) 500 MG tablet Take 1,000 mg by mouth daily. AM     Vitamin D, Ergocalciferol, (DRISDOL) 1.25 MG (50000 UNIT) CAPS capsule TAKE 1 CAPSULE (50,000 UNITS TOTAL) BY MOUTH EVERY 7 (SEVEN) DAYS 12 capsule 3   No current facility-administered medications for this visit.     Past Medical History:  Diagnosis Date   Allergic rhinitis    Anxiety    Anxiety    Arthritis    "~ all my joints" (05/18/2017)   Atrial fibrillation (HCC) 09/2016   Atrial fibrillation with RVR (HCC) 05/18/2017   Chronic heart failure with preserved ejection fraction (HFpEF) (HCC)    Chronic kidney disease, stage 3a (HCC)    Chronic lower back pain    COPD (chronic obstructive pulmonary disease) (HCC)    Depression    Diverticulosis     GERD (gastroesophageal reflux disease)    Glaucoma    Gout    History of blood transfusion 1983   "when I gave a 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"   Hyperlipidemia    Hypertension    Hypothyroidism    Macular degeneration of both eyes    Neuropathy    On home oxygen therapy    "3L; 24/7" (05/18/2017)   Pre-diabetes    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   Pulmonary fibrosis (HCC)    Rheumatoid arthritis (HCC)    Sleep apnea    uses 3 liters of o2 24/7   Urticaria     ROS:   All systems reviewed and negative except as noted in the HPI.   Past Surgical History:  Procedure Laterality Date   AV NODE ABLATION  05/18/2017   AV NODE ABLATION N/A 05/18/2017   Procedure: AV NODE ABLATION;  Surgeon: Marinus Maw, MD;  Location: [facility];  Service: Cardiovascular;  Laterality: N/A;   BACK SURGERY     BREAST LUMPECTOMY Left    CARDIOVERSION N/A 01/30/2017   Procedure: CARDIOVERSION;  Surgeon: Wendall Stade, MD;  [facility]: [facility];  Service: Cardiovascular;  Laterality: N/A;   CARDIOVERSION N/A 04/09/2017   Procedure: CARDIOVERSION;  Surgeon: Jonelle Sidle, MD;  Location: [facility];  Service: Cardiovascular;  Laterality: N/A;   CATARACT EXTRACTION W/ INTRAOCULAR LENS  IMPLANT, BILATERAL Bilateral    DILATION AND CURETTAGE OF UTERUS     EUS N/A 10/03/2015   Procedure: UPPER ENDOSCOPIC ULTRASOUND (EUS) RADIAL;  Surgeon: Willis Modena, MD;  Location: [facility];  Service: Endoscopy;  Laterality: N/A;   FINE NEEDLE ASPIRATION N/A 10/03/2015   Procedure: FINE NEEDLE ASPIRATION (FNA) RADIAL;  Surgeon: Willis Modena, MD;  Location: [facility];  Service: Endoscopy;  Laterality: N/A;   INSERT / REPLACE / REMOVE PACEMAKER  05/18/2017   LUMBAR LAMINECTOMY  1995; 06/23/2008   L4-5/notes 08/20/2010   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   NEPHRECTOMY TRANSPLANTED ORGAN     PACEMAKER IMPLANT N/A 05/18/2017   Procedure: PACEMAKER IMPLANT;   Surgeon: Marinus Maw, MD;  Location: [facility];  Service: Cardiovascular;  Laterality: N/A;   TOTAL ABDOMINAL HYSTERECTOMY     TUBAL LIGATION       Family History  Problem Relation Age of Onset   Diabetes Other    Hypertension Other    Coronary artery disease Other    Stroke Other    Asthma Other    Kidney disease  Sister    Cancer Sister        lung   Lung cancer Sister    Cancer Sister        lung   Lung cancer Sister    Cancer Sister        lung   Suicidality Son        committed suicide in 2009   CVA Son    Hyperlipidemia Other    Anxiety disorder Other    Migraines Other    Heart failure Father    Healthy Brother    Cancer Sister        lung   Healthy Sister      Social History   Socioeconomic History   Marital status: Widowed    Spouse name: Teyanna Thielman   Number of children: 4   Years of education: 9   Highest education level: 9th grade  Occupational History   Occupation: retired    Comment: cna  Tobacco Use   Smoking status: Never    Passive exposure: Never   Smokeless tobacco: Never  Vaping Use   Vaping status: Never Used  Substance and Sexual Activity   Alcohol use: No   Drug use: No   Sexual activity: Not Currently    Partners: Male    Birth control/protection: Surgical  Other Topics Concern   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   Pt lives alone in single wide trailer. They have 4 children and 13 grandchildren.    Social Drivers of Corporate investment banker Strain: Low Risk  (03/28/2022)   Overall Financial Resource Strain (CARDIA)    Difficulty of Paying Living Expenses: Not hard at all  Food Insecurity: No Food Insecurity (03/28/2022)   Hunger Vital Sign    Worried About Running Out of Food in the Last Year: Never true    Ran Out of Food in the Last Year: Never true  Transportation Needs: No Transportation Needs (03/28/2022)   PRAPARE -  Administrator, Civil Service (Medical): No    Lack of Transportation (Non-Medical): No  Physical Activity: Insufficiently Active (03/28/2022)   Exercise Vital Sign    Days of Exercise per Week: 3 days    Minutes of Exercise per Session: 30 min  Stress: No Stress Concern Present (03/28/2022)   [employer] of Occupational Health - Occupational Stress Questionnaire    Feeling of Stress : Not at all  Social Connections: Moderately Isolated (03/28/2022)   Social Connection and Isolation Panel [NHANES]    Frequency of Communication with Friends and Family: More than three times a week    Frequency of Social Gatherings with Friends and Family: More than three times a week    Attends Religious Services: 1 to 4 times per year    Active Member of [employer] or Organizations: No    Attends Banker Meetings: Never    Marital Status: Widowed  Intimate Partner Violence: Not At Risk (03/28/2022)   Humiliation, Afraid, Rape, and Kick questionnaire    Fear of Current or Ex-Partner: No    Emotionally Abused: No    Physically Abused: No    Sexually Abused: No     BP (!) 148/82   Pulse 77   Ht 5\' 7"  (1.702 m)   Wt 183 lb 6.4 oz (83.2 kg)   SpO2 96%   BMI 28.72 kg/m  Physical Exam:  Stable appearing elderly man, NAD HEENT: Unremarkable Neck:  No JVD, no thyromegally Lymphatics:  No adenopathy Back:  No CVA tenderness Lungs:  Clear with no wheezes HEART:  Regular rate rhythm, no murmurs, no rubs, no clicks Abd:  soft, positive bowel sounds, no organomegally, no rebound, no guarding Ext:  2 plus pulses, no edema, no cyanosis, no clubbing Skin:  No rashes no nodules Neuro:  CN II through XII intact, motor grossly intact   DEVICE  Normal device function.  See PaceArt for details.   Assess/Plan:  Atrial fib - her VR is well controlled. She has no escape rhythm today but denies palpitations. Obesity - her weight is better and she is now under 200 lbs.  HTN - her  bp is up a bit today. We will follow. 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 The His bundle lead in the atrial port has a chronically elevated threshold. But is stable. We will follow.   Sharlot Gowda Reneshia Zuccaro,MD

## 2023-06-08 NOTE — Patient Instructions (Signed)
 Medication Instructions:  Your physician recommends that you continue on your current medications as directed. Please refer to the Current Medication list given to you today.   Labwork: None today  Testing/Procedures: None today  Follow-Up: 1 year  Any Other Special Instructions Will Be Listed Below (If Applicable).  If you need a refill on your cardiac medications before your next appointment, please call your pharmacy.

## 2023-06-17 ENCOUNTER — Other Ambulatory Visit: Payer: Self-pay | Admitting: Family

## 2023-06-21 ENCOUNTER — Other Ambulatory Visit: Payer: Self-pay | Admitting: Family

## 2023-06-21 DIAGNOSIS — E1142 Type 2 diabetes mellitus with diabetic polyneuropathy: Secondary | ICD-10-CM

## 2023-06-22 ENCOUNTER — Other Ambulatory Visit: Payer: Self-pay | Admitting: Family

## 2023-06-23 ENCOUNTER — Ambulatory Visit (INDEPENDENT_AMBULATORY_CARE_PROVIDER_SITE_OTHER): Payer: 59

## 2023-06-23 DIAGNOSIS — I4819 Other persistent atrial fibrillation: Secondary | ICD-10-CM | POA: Diagnosis not present

## 2023-06-24 LAB — CUP PACEART REMOTE DEVICE CHECK
Battery Remaining Longevity: 16 mo
Battery Voltage: 2.8 V
Brady Statistic AP VP Percent: 6.53 %
Brady Statistic AP VS Percent: 93.46 %
Brady Statistic AS VP Percent: 0 %
Brady Statistic AS VS Percent: 0.01 %
Brady Statistic RA Percent Paced: 99.99 %
Brady Statistic RV Percent Paced: 6.53 %
Date Time Interrogation Session: 20250303185147
Implantable Lead Connection Status: 753985
Implantable Lead Connection Status: 753985
Implantable Lead Implant Date: 20190128
Implantable Lead Implant Date: 20190128
Implantable Lead Location: 753860
Implantable Lead Location: 753860
Implantable Lead Model: 3830
Implantable Lead Model: 5076
Implantable Pulse Generator Implant Date: 20190128
Lead Channel Impedance Value: 304 Ohm
Lead Channel Impedance Value: 323 Ohm
Lead Channel Impedance Value: 380 Ohm
Lead Channel Impedance Value: 380 Ohm
Lead Channel Sensing Intrinsic Amplitude: 3.75 mV
Lead Channel Sensing Intrinsic Amplitude: 3.75 mV
Lead Channel Sensing Intrinsic Amplitude: 7.625 mV
Lead Channel Sensing Intrinsic Amplitude: 7.625 mV
Lead Channel Setting Pacing Amplitude: 2.5 V
Lead Channel Setting Pacing Amplitude: 2.5 V
Lead Channel Setting Pacing Pulse Width: 0.4 ms
Lead Channel Setting Sensing Sensitivity: 0.9 mV
Zone Setting Status: 755011
Zone Setting Status: 755011

## 2023-06-25 ENCOUNTER — Encounter: Payer: Self-pay | Admitting: Internal Medicine

## 2023-06-26 DIAGNOSIS — J449 Chronic obstructive pulmonary disease, unspecified: Secondary | ICD-10-CM | POA: Diagnosis not present

## 2023-06-26 DIAGNOSIS — I509 Heart failure, unspecified: Secondary | ICD-10-CM | POA: Diagnosis not present

## 2023-06-26 DIAGNOSIS — J8410 Pulmonary fibrosis, unspecified: Secondary | ICD-10-CM | POA: Diagnosis not present

## 2023-06-29 ENCOUNTER — Ambulatory Visit: Payer: 59 | Attending: Nurse Practitioner | Admitting: Nurse Practitioner

## 2023-06-29 ENCOUNTER — Encounter: Payer: Self-pay | Admitting: Nurse Practitioner

## 2023-06-29 VITALS — BP 120/72 | HR 64 | Ht 67.0 in | Wt 190.0 lb

## 2023-06-29 DIAGNOSIS — R0789 Other chest pain: Secondary | ICD-10-CM

## 2023-06-29 DIAGNOSIS — Z79899 Other long term (current) drug therapy: Secondary | ICD-10-CM

## 2023-06-29 DIAGNOSIS — I5032 Chronic diastolic (congestive) heart failure: Secondary | ICD-10-CM | POA: Diagnosis not present

## 2023-06-29 DIAGNOSIS — E875 Hyperkalemia: Secondary | ICD-10-CM

## 2023-06-29 DIAGNOSIS — R0609 Other forms of dyspnea: Secondary | ICD-10-CM | POA: Diagnosis not present

## 2023-06-29 DIAGNOSIS — N183 Chronic kidney disease, stage 3 unspecified: Secondary | ICD-10-CM

## 2023-06-29 DIAGNOSIS — J8410 Pulmonary fibrosis, unspecified: Secondary | ICD-10-CM | POA: Diagnosis not present

## 2023-06-29 DIAGNOSIS — M25473 Effusion, unspecified ankle: Secondary | ICD-10-CM | POA: Diagnosis not present

## 2023-06-29 DIAGNOSIS — I4821 Permanent atrial fibrillation: Secondary | ICD-10-CM

## 2023-06-29 DIAGNOSIS — Z950 Presence of cardiac pacemaker: Secondary | ICD-10-CM

## 2023-06-29 NOTE — Patient Instructions (Addendum)
Medication Instructions:  Your physician recommends that you continue on your current medications as directed. Please refer to the Current Medication list given to you today.  Labwork: Please have previous labs done   Testing/Procedures: None   Follow-Up: Your physician recommends that you schedule a follow-up appointment in: 6 Months   Any Other Special Instructions Will Be Listed Below (If Applicable).  If you need a refill on your cardiac medications before your next appointment, please call your pharmacy.

## 2023-06-29 NOTE — Progress Notes (Unsigned)
 Office Visit    Patient Name: Lorraine Leonard Date of Encounter: 06/29/2023 PCP:  Junie Spencer, FNP [facility] Cardiologist:  Dina Rich, MD  Advanced Practice Provider:  No care team member to display Electrophysiologist:  Lewayne Bunting, MD   Chief Complaint and HPI    Lorraine Leonard is a 83 y.o. 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  Pt is closely followed by EP.   I last saw patient on May 22, 2023. Was about to start tx for a UTI.  Overall was doing well, however admitted to feeling out of breath easier with walking every once in a while that seemed to be worse since I last saw her. Denied any chest pain, palpitations, syncope, presyncope, dizziness, orthopnea, PND, swelling or significant weight changes, acute bleeding, or claudication.  Today she presents for follow-up. She admits to occasional chest pain, attributes this to foods that she eats. Believes this is related to indigestion. Reports eating fried/spicy foods. Denies any worsening shortness of breath, palpitations, syncope, presyncope, dizziness, orthopnea, PND,  significant weight changes, acute bleeding, or claudication. Admits to ankle swelling. She has not had labs obtained that were previously arranged at last office visit.    EKGs/Labs/Other Studies Reviewed:   The following studies were reviewed today:   EKG:  EKG is not ordered today.   Echo 07/2022: 1. Left ventricular ejection fraction, by estimation, is 65 to 70%. The  left ventricle has normal function. The left ventricle has no regional  wall motion abnormalities. There is mild left ventricular hypertrophy.  Left ventricular diastolic function  could not be evaluated.   2.  Right ventricular systolic function is normal. The right ventricular  size is normal. There is mildly elevated pulmonary artery systolic  pressure. The estimated right ventricular systolic pressure is 38.8 mmHg.   3. Left atrial size was mild to moderately dilated.   4. Right atrial size was mildly dilated.   5. The mitral valve is normal in structure. Mild mitral valve  regurgitation. No evidence of mitral stenosis.   6. The aortic valve is tricuspid. There is mild calcification of the  aortic valve. Aortic valve regurgitation is mild. No aortic stenosis is  present.   7. Aortic dilatation noted. There is borderline dilatation of the aortic  root, measuring 38 mm.   8. The inferior vena cava is normal in size with greater than 50%  respiratory variability, suggesting right atrial pressure of 3 mmHg.   Comparison(s): No significant change from prior study. Stable mild TR,  mild AI.  Risk Assessment/Calculations:   CHA2DS2-VASc Score = 5  This indicates a 7.2% annual risk of stroke. The patient's score is based upon: CHF History: 1 HTN History: 1 Diabetes History: 0 Stroke History: 0 Vascular Disease History: 0 Age Score: 2 Gender Score: 1   Review of Systems    All other systems reviewed and are otherwise negative except as noted above.  Physical Exam    VS:  BP 120/72   Pulse 64   Ht 5\' 7"  (1.702 m)   Wt 190 lb (86.2 kg)   SpO2 97%   BMI 29.76 kg/m  , BMI Body mass index is 29.76 kg/m.  Wt Readings from Last 3 Encounters:  06/29/23 190 lb (86.2  kg)  06/08/23 183 lb 6.4 oz (83.2 kg)  05/22/23 190 lb (86.2 kg)    GEN: Well nourished, well developed, in no acute distress. HEENT: normal. Neck: Supple, no JVD, carotid bruits, or masses. Cardiac: S1/S2, RRR, no murmurs, rubs, or gallops. No clubbing, cyanosis.  2+ edema localized to right lateral ankle, 1+ noted to left lateral ankle. Radials 2+/PT 1+ and equal bilaterally.  Respiratory:  Respirations regular and  unlabored, clear to auscultation bilaterally. MS: No deformity or atrophy.  Skin: Warm and dry, no rash. Neuro:  Strength and sensation are intact. Psych: Normal affect.  Assessment & Plan    Atypical chest pain Does not sound cardiac in etiology, pt attributes this to her eating habits and foods she eats. Discussed to avoid fried/fatty/spicy foods. Heart healthy diet encouraged. Will continue to monitor. No medication changes at this time. Care and ED precautions discussed.   Chronic HFpEF, ankle swelling Stage C, NYHA class II-III symptoms. Euvolemic and well compensated on exam, but does have some ankle swelling. Echo 07/2022 revealed EF 65-70%. Continue atenolol, Lasix, and Aldactone.  Low sodium diet, fluid restriction <2L, and daily weights encouraged. Educated to contact our office for weight gain of 2 lbs overnight or 5 lbs in one week. Will obtain previously ordered labs: proBNP, BMET, and Mag level.   2. Permanent A-fib, s/p AVN ablation and PPM Denies any tachycardia or palpitations. Recent remote device check revealed normal device function. No medication changes at this time. Continue Eliquis for ChadsVasc score of 5, on appropriate dosage. Denies any bleeding issues. Continue to follow-up with EP.   4. CKD stage III Most recent kidney function stable. Avoid nephrotoxic agents. Encouraged adequate hydration. Continue to follow with PCP. Obtaining labs as mentioned above.   5. DOE, pulmonary fibrosis Symptoms appear to be related to pulmonary fibrosis and HFpEF, symptoms seem to be stable. No medication changes at this time. Continue to follow-up with pulmonology. ED precautions discussed. Will obtain previously ordered labs: proBNP, BMET, and Mag level.   6. Hyperkalemia, medication management Most recent K+ level on file was 5.3. Will repeat BMET as mentioned above. Not on any potassium supplement but is on spironolactone. Denies any concerning symptoms. Will continue to monitor.  Care and ED precautions discussed.   Disposition: Follow up in 6 months with Dina Rich, MD or APP.  Signed, Sharlene Dory, NP

## 2023-07-12 ENCOUNTER — Other Ambulatory Visit: Payer: Self-pay | Admitting: Family

## 2023-07-12 DIAGNOSIS — J449 Chronic obstructive pulmonary disease, unspecified: Secondary | ICD-10-CM

## 2023-07-13 NOTE — Progress Notes (Signed)
 History of Present Illness: Lorraine Leonard is a 83 y.o. year old female here for follow-up of recurrent cystitis.  Her last visit was about a year ago.  She has persistent vaginal discomfort.  No foul-smelling urine but she does have occasional dysuria.  She has had no gross hematuria.  She was treated for an enterococcal UTI about 2 months ago.  She is no longer on estrogen cream.  She does take cranberry capsules daily.  Past Medical History:  Diagnosis Date   Allergic rhinitis    Anxiety    Anxiety    Arthritis    "~ all my joints" (05/18/2017)   Atrial fibrillation (HCC) 09/2016   Atrial fibrillation with RVR (HCC) 05/18/2017   Chronic heart failure with preserved ejection fraction (HFpEF) (HCC)    Chronic kidney disease, stage 3a (HCC)    Chronic lower back pain    COPD (chronic obstructive pulmonary disease) (HCC)    Depression    Diverticulosis    GERD (gastroesophageal reflux disease)    Glaucoma    Gout    History of blood transfusion 1983   "when I gave a 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"   Hyperlipidemia    Hypertension    Hypothyroidism    Macular degeneration of both eyes    Neuropathy    On home oxygen therapy    "3L; 24/7" (05/18/2017)   Pre-diabetes    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   Pulmonary fibrosis (HCC)    Rheumatoid arthritis (HCC)    Sleep apnea    uses 3 liters of o2 24/7   Urticaria     Past Surgical History:  Procedure Laterality Date   AV NODE ABLATION  05/18/2017   AV NODE ABLATION N/A 05/18/2017   Procedure: AV NODE ABLATION;  Surgeon: Marinus Maw, MD;  [facility]: [facility];  Service: Cardiovascular;  Laterality: N/A;   BACK SURGERY     BREAST LUMPECTOMY Left    CARDIOVERSION N/A 01/30/2017   Procedure: CARDIOVERSION;  Surgeon: Wendall Stade, MD;  [facility];  Service: Cardiovascular;  Laterality: N/A;   CARDIOVERSION N/A 04/09/2017   Procedure: CARDIOVERSION;  Surgeon: Jonelle Sidle, MD;  [facility];  Service: Cardiovascular;  Laterality: N/A;   CATARACT EXTRACTION W/ INTRAOCULAR LENS  IMPLANT, BILATERAL Bilateral    DILATION AND CURETTAGE OF UTERUS     EUS N/A 10/03/2015   Procedure: UPPER ENDOSCOPIC ULTRASOUND (EUS) RADIAL;  Surgeon: Willis Modena, MD;  Location: [facility];  Service: Endoscopy;  Laterality: N/A;   FINE NEEDLE ASPIRATION N/A 10/03/2015   Procedure: FINE NEEDLE ASPIRATION (FNA) RADIAL;  Surgeon: Willis Modena, MD;  Location: [facility];  Service: Endoscopy;  Laterality: N/A;   INSERT / REPLACE / REMOVE PACEMAKER  05/18/2017   LUMBAR LAMINECTOMY  1995; 06/23/2008   L4-5/notes 08/20/2010   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   NEPHRECTOMY TRANSPLANTED ORGAN     PACEMAKER IMPLANT N/A 05/18/2017   Procedure: PACEMAKER IMPLANT;  Surgeon: Marinus Maw, MD;  [facility]: [facility];  Service: Cardiovascular;  Laterality: N/A;   TOTAL ABDOMINAL HYSTERECTOMY     TUBAL LIGATION      Home Medications:  (Not in a hospital admission)   Allergies:  Allergies  Allergen Reactions   Allopurinol Swelling    Feet and legs swell   Mobic [Meloxicam] Swelling and Other (See Comments)    Feet and Leg  swelling   Macrodantin [Nitrofurantoin Macrocrystal]     Family History  Problem Relation Age of Onset   Diabetes Other    Hypertension Other    Coronary artery disease Other    Stroke Other    Asthma Other    Kidney disease Sister    Cancer Sister        lung   Lung cancer Sister    Cancer Sister        lung   Lung cancer Sister    Cancer Sister        lung   Suicidality Son        committed suicide in 2009   CVA Son    Hyperlipidemia Other    Anxiety disorder Other    Migraines Other    Heart failure Father    Healthy Brother    Cancer Sister        lung   Healthy Sister     Social History:  reports that she has never smoked. She has never been exposed to tobacco smoke. She has never used smokeless tobacco. She  reports that she does not drink alcohol and does not use drugs.  ROS: A complete review of systems was performed.  All systems are negative except for pertinent findings as noted.  Physical Exam:  Vital signs in last 24 hours: @VSRANGES @ General:  Alert and oriented, No acute distress HEENT: Normocephalic, atraumatic Neck: No JVD or lymphadenopathy Cardiovascular: Regular rate  Lungs: Normal inspiratory/expiratory excursion Extremities: No edema Neurologic: Grossly intact  I have reviewed prior pt notes  I have reviewed notes from referring/previous physicians  I have reviewed urinalysis results-pyuria noted today  I have independently reviewed prior imaging  I have reviewed prior urine culture-prior cultures reviewed  Impression/Assessment:  Atrophic vaginal changes-she is no longer using estrogen cream  History of recurrent UTIs-looks like she has a UTI today  Plan:  Urine was cultured.  No antibiotic administered yet  Continue cranberry therapy  I strongly recommend that she take/use the estrogen cream on a regular basis.  That prescription was sent in again  I will see back in about 4 months.  Bertram Millard Salena Ortlieb 07/13/2023, 6:46 PM  Bertram Millard. Niveah Boerner MD

## 2023-07-14 ENCOUNTER — Encounter: Payer: Self-pay | Admitting: Urology

## 2023-07-14 ENCOUNTER — Ambulatory Visit (INDEPENDENT_AMBULATORY_CARE_PROVIDER_SITE_OTHER): Payer: 59 | Admitting: Urology

## 2023-07-14 ENCOUNTER — Telehealth: Payer: Self-pay

## 2023-07-14 VITALS — BP 155/66 | HR 85

## 2023-07-14 DIAGNOSIS — Z87440 Personal history of urinary (tract) infections: Secondary | ICD-10-CM | POA: Diagnosis not present

## 2023-07-14 DIAGNOSIS — N952 Postmenopausal atrophic vaginitis: Secondary | ICD-10-CM | POA: Diagnosis not present

## 2023-07-14 DIAGNOSIS — R300 Dysuria: Secondary | ICD-10-CM

## 2023-07-14 DIAGNOSIS — N390 Urinary tract infection, site not specified: Secondary | ICD-10-CM

## 2023-07-14 LAB — URINALYSIS, ROUTINE W REFLEX MICROSCOPIC
Bilirubin, UA: NEGATIVE
Glucose, UA: NEGATIVE
Ketones, UA: NEGATIVE
Nitrite, UA: POSITIVE — AB
Specific Gravity, UA: 1.015 (ref 1.005–1.030)
Urobilinogen, Ur: 0.2 mg/dL (ref 0.2–1.0)
pH, UA: 6 (ref 5.0–7.5)

## 2023-07-14 LAB — MICROSCOPIC EXAMINATION: WBC, UA: >30 /HPF — AB (ref 0–5)

## 2023-07-14 MED ORDER — ESTRADIOL 0.1 MG/GM VA CREA: 1.0000 g | TOPICAL_CREAM | Freq: Every day | VAGINAL | 3 refills | Status: DC

## 2023-07-14 NOTE — Telephone Encounter (Signed)
 Called Pt to let her know MD wanted to get a urine culture done on her and she didn't provide [place] with enough urine at her appt today to get the culture completed LVM to c/b and schedule urine drop off

## 2023-07-16 ENCOUNTER — Telehealth: Payer: Self-pay | Admitting: Urology

## 2023-07-16 NOTE — Telephone Encounter (Signed)
 Pt was called to advise her to go to urgent care due to urine culture results turn around time Pt states she isn't going to urgent care instead she's going to wait for appt on Tuesday with other MD

## 2023-07-16 NOTE — Telephone Encounter (Signed)
 Said Dr prescribed a cream and she is still having a lot of pain. Wants to know if she needs an antibiotic or something

## 2023-07-18 ENCOUNTER — Other Ambulatory Visit: Payer: Self-pay | Admitting: Family

## 2023-07-19 ENCOUNTER — Other Ambulatory Visit: Payer: Self-pay | Admitting: Family

## 2023-07-19 DIAGNOSIS — F331 Major depressive disorder, recurrent, moderate: Secondary | ICD-10-CM

## 2023-07-19 DIAGNOSIS — F411 Generalized anxiety disorder: Secondary | ICD-10-CM

## 2023-07-21 ENCOUNTER — Ambulatory Visit (INDEPENDENT_AMBULATORY_CARE_PROVIDER_SITE_OTHER)

## 2023-07-21 ENCOUNTER — Ambulatory Visit: Payer: 59 | Admitting: Family

## 2023-07-21 ENCOUNTER — Other Ambulatory Visit: Payer: Self-pay | Admitting: Family

## 2023-07-21 ENCOUNTER — Encounter: Payer: Self-pay | Admitting: Family

## 2023-07-21 VITALS — BP 146/69 | HR 77 | Temp 98.2°F | Ht 67.0 in | Wt 189.0 lb

## 2023-07-21 DIAGNOSIS — I4891 Unspecified atrial fibrillation: Secondary | ICD-10-CM

## 2023-07-21 DIAGNOSIS — Z0001 Encounter for general adult medical examination with abnormal findings: Secondary | ICD-10-CM

## 2023-07-21 DIAGNOSIS — I5031 Acute diastolic (congestive) heart failure: Secondary | ICD-10-CM | POA: Diagnosis not present

## 2023-07-21 DIAGNOSIS — M85832 Other specified disorders of bone density and structure, left forearm: Secondary | ICD-10-CM | POA: Diagnosis not present

## 2023-07-21 DIAGNOSIS — E1159 Type 2 diabetes mellitus with other circulatory complications: Secondary | ICD-10-CM | POA: Diagnosis not present

## 2023-07-21 DIAGNOSIS — E1169 Type 2 diabetes mellitus with other specified complication: Secondary | ICD-10-CM

## 2023-07-21 DIAGNOSIS — I152 Hypertension secondary to endocrine disorders: Secondary | ICD-10-CM

## 2023-07-21 DIAGNOSIS — E785 Hyperlipidemia, unspecified: Secondary | ICD-10-CM

## 2023-07-21 DIAGNOSIS — E669 Obesity, unspecified: Secondary | ICD-10-CM

## 2023-07-21 DIAGNOSIS — R300 Dysuria: Secondary | ICD-10-CM | POA: Diagnosis not present

## 2023-07-21 DIAGNOSIS — N183 Chronic kidney disease, stage 3 unspecified: Secondary | ICD-10-CM

## 2023-07-21 DIAGNOSIS — E1142 Type 2 diabetes mellitus with diabetic polyneuropathy: Secondary | ICD-10-CM | POA: Diagnosis not present

## 2023-07-21 DIAGNOSIS — I708 Atherosclerosis of other arteries: Secondary | ICD-10-CM

## 2023-07-21 DIAGNOSIS — F331 Major depressive disorder, recurrent, moderate: Secondary | ICD-10-CM

## 2023-07-21 DIAGNOSIS — E039 Hypothyroidism, unspecified: Secondary | ICD-10-CM

## 2023-07-21 DIAGNOSIS — M159 Polyosteoarthritis, unspecified: Secondary | ICD-10-CM

## 2023-07-21 DIAGNOSIS — J449 Chronic obstructive pulmonary disease, unspecified: Secondary | ICD-10-CM

## 2023-07-21 DIAGNOSIS — I700 Atherosclerosis of aorta: Secondary | ICD-10-CM | POA: Diagnosis not present

## 2023-07-21 DIAGNOSIS — Z0000 Encounter for general adult medical examination without abnormal findings: Secondary | ICD-10-CM

## 2023-07-21 DIAGNOSIS — N3001 Acute cystitis with hematuria: Secondary | ICD-10-CM

## 2023-07-21 DIAGNOSIS — F419 Anxiety disorder, unspecified: Secondary | ICD-10-CM

## 2023-07-21 DIAGNOSIS — E559 Vitamin D deficiency, unspecified: Secondary | ICD-10-CM | POA: Diagnosis not present

## 2023-07-21 DIAGNOSIS — Z780 Asymptomatic menopausal state: Secondary | ICD-10-CM | POA: Diagnosis not present

## 2023-07-21 DIAGNOSIS — K5901 Slow transit constipation: Secondary | ICD-10-CM

## 2023-07-21 DIAGNOSIS — K219 Gastro-esophageal reflux disease without esophagitis: Secondary | ICD-10-CM

## 2023-07-21 LAB — MICROSCOPIC EXAMINATION
Epithelial Cells (non renal): NONE SEEN /HPF (ref 0–10)
RBC, Urine: >30 /HPF — AB (ref 0–2)
Renal Epithel, UA: NONE SEEN /HPF
WBC, UA: >30 /HPF — AB (ref 0–5)
Yeast, UA: NONE SEEN

## 2023-07-21 LAB — URINALYSIS, COMPLETE
Bilirubin, UA: NEGATIVE
Glucose, UA: NEGATIVE
Ketones, UA: NEGATIVE
Nitrite, UA: NEGATIVE
Specific Gravity, UA: 1.02 (ref 1.005–1.030)
Urobilinogen, Ur: 0.2 mg/dL (ref 0.2–1.0)
pH, UA: 5.5 (ref 5.0–7.5)

## 2023-07-21 MED ORDER — CEFTRIAXONE SODIUM 1 G IJ SOLR
1.0000 g | Freq: Once | INTRAMUSCULAR | Status: AC
  Administered 2023-07-21: 1 g via INTRAMUSCULAR

## 2023-07-21 MED ORDER — CEPHALEXIN 500 MG PO CAPS: 500.0000 mg | ORAL_CAPSULE | Freq: Two times a day (BID) | ORAL | 0 refills | Status: DC

## 2023-07-21 NOTE — Patient Instructions (Signed)

## 2023-07-21 NOTE — Progress Notes (Signed)
 Subjective:    Patient ID: Lorraine Leonard, female    DOB: 03/11/1941, 83 y.o.   MRN: [id number]  Chief Complaint  Patient presents with   Medical Management of Chronic Issues   Dysuria   Pt presents to the office today for CPE and chronic follow up.    She is followed by Cardiologists every 6 months for CHF & A Fib. She is followed by Pulmonologist annually for pulmonary hypertension, COPD, and Pulmonary Fibrosis. She was told to decrease her oxygen to as needed. Pt is not on oxygen today.  She reports her breathing is stable, but SOB with exertion.   Has atherosclerosis and takes    She is followed by Nephrologists as needed for CKD. Avoids NSAID's.   She is followed by Urologists for recurrent UTI's annually.  Hypertension This is a chronic problem. The current episode started more than 1 year ago. The problem has been waxing and waning since onset. The problem is uncontrolled. Associated symptoms include anxiety, blurred vision, malaise/fatigue, peripheral edema and shortness of breath. Risk factors for coronary artery disease include dyslipidemia, diabetes mellitus, obesity, sedentary lifestyle and post-menopausal state. The current treatment provides mild improvement. Identifiable causes of hypertension include a thyroid problem.  Congestive Heart Failure Presents for follow-up visit. Associated symptoms include edema, fatigue and shortness of breath. The symptoms have been stable.  Gastroesophageal Reflux She complains of belching and heartburn. She reports no nausea. This is a chronic problem. The current episode started more than 1 year ago. The problem occurs occasionally. Associated symptoms include fatigue. Risk factors include obesity. She has tried a PPI for the symptoms. The treatment provided moderate relief.  Diabetes She presents for her follow-up diabetic visit. She has type 2 diabetes mellitus. Hypoglycemia symptoms include nervousness/anxiousness. Associated symptoms  include blurred vision, fatigue and foot paresthesias. Symptoms are stable. 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 Risk factors for coronary artery disease include dyslipidemia, diabetes mellitus, hypertension, sedentary lifestyle and post-menopausal. She is following a generally unhealthy diet. (Not checking glucose at home)  Hyperlipidemia This is a chronic problem. The current episode started more than 1 year ago. The problem is controlled. Recent lipid tests were reviewed and are normal. Exacerbating diseases include obesity. Associated symptoms include shortness of breath. Current antihyperlipidemic treatment includes statins. The current treatment provides moderate improvement of lipids. Risk factors for coronary artery disease include dyslipidemia, diabetes mellitus, hypertension, a sedentary lifestyle, post-menopausal and obesity.  Thyroid Problem Presents for follow-up visit. Symptoms include anxiety, constipation and fatigue. The symptoms have been stable.  Arthritis Presents for follow-up visit. She complains of pain and stiffness. Affected locations include the left knee, right knee, left MCP and right MCP. Her pain is at a severity of 1/10. Associated symptoms include dysuria and fatigue.  Depression        This is a chronic problem.  The current episode started more than 1 year ago.   The problem occurs intermittently.  Associated symptoms include fatigue and restlessness.  Associated symptoms include no helplessness, no hopelessness and not sad.  Past treatments include nothing.  Past medical history includes thyroid problem and anxiety.   Anxiety Presents for follow-up visit. Symptoms include excessive worry, nervous/anxious behavior, restlessness and shortness of breath. Patient reports no nausea. Symptoms occur occasionally. The severity of symptoms is moderate.    Constipation This is a chronic problem. The current episode started more than 1 year ago.  The problem has been waxing and waning since onset. Pertinent negatives  include no nausea or vomiting. Risk factors include obesity. She has tried diet changes and stool softeners for the symptoms. The treatment provided moderate relief.  Dysuria  This is a recurrent problem. The current episode started 1 to 4 weeks ago. The problem occurs every urination. The problem has been gradually worsening. The quality of the pain is described as burning. The pain is at a severity of 10/10. The pain is moderate. Associated symptoms include frequency, hematuria, hesitancy and urgency. Pertinent negatives include no nausea or vomiting. She has tried increased fluids for the symptoms. The treatment provided mild relief.      Review of Systems  Constitutional:  Positive for fatigue and malaise/fatigue.  Eyes:  Positive for blurred vision.  Respiratory:  Positive for shortness of breath.   Gastrointestinal:  Positive for constipation and heartburn. Negative for nausea and vomiting.  Genitourinary:  Positive for dysuria, frequency, hematuria, hesitancy and urgency.  Musculoskeletal:  Positive for stiffness.  Psychiatric/Behavioral:  The patient is nervous/anxious.   All other systems reviewed and are negative.  Family History  Problem Relation Age of Onset   Diabetes Other    Hypertension Other    Coronary artery disease Other    Stroke Other    Asthma Other    Kidney disease Sister    Cancer Sister        lung   Lung cancer Sister    Cancer Sister        lung   Lung cancer Sister    Cancer Sister        lung   Suicidality Son        committed suicide in 2009   CVA Son    Hyperlipidemia Other    Anxiety disorder Other    Migraines Other    Heart failure Father    Healthy Brother    Cancer Sister        lung   Healthy Sister    Social History   Socioeconomic History   Marital status: Widowed    Spouse name: Lorraine Leonard   Number of children: 4   Years of education: 9   Highest  education level: 9th grade  Occupational History   Occupation: retired    Comment: cna  Tobacco Use   Smoking status: Never    Passive exposure: Never   Smokeless tobacco: Never  Vaping Use   Vaping status: Never Used  Substance and Sexual Activity   Alcohol use: No   Drug use: No   Sexual activity: Not Currently    Partners: Male    Birth control/protection: Surgical  Other Topics Concern   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   Pt lives alone in single wide trailer. They have 4 children and 13 grandchildren.    Social Drivers of Corporate investment banker Strain: Low Risk  (03/28/2022)   Overall Financial Resource Strain (CARDIA)    Difficulty of Paying Living Expenses: Not hard at all  Food Insecurity: No Food Insecurity (03/28/2022)   Hunger Vital Sign    Worried About Running Out of Food in the Last Year: Never true    Ran Out of Food in the Last Year: Never true  Transportation Needs: No Transportation Needs (03/28/2022)   PRAPARE - Administrator, Civil Service (Medical): No    Lack of Transportation (Non-Medical): No  Physical Activity: Insufficiently Active (03/28/2022)  Exercise Vital Sign    Days of Exercise per Week: 3 days    Minutes of Exercise per Session: 30 min  Stress: No Stress Concern Present (03/28/2022)   [employer] of Occupational Health - Occupational Stress Questionnaire    Feeling of Stress : Not at all  Social Connections: Moderately Isolated (03/28/2022)   Social Connection and Isolation Panel [NHANES]    Frequency of Communication with Friends and Family: More than three times a week    Frequency of Social Gatherings with Friends and Family: More than three times a week    Attends Religious Services: 1 to 4 times per year    Active Member of [employer] or Organizations: No    Attends Banker Meetings: Never    Marital Status: Widowed         Objective:   Physical Exam Vitals reviewed.  Constitutional:      General: She is not in acute distress.    Appearance: She is well-developed. She is obese.  HENT:     Head: Normocephalic and atraumatic.     Right Ear: Tympanic membrane normal.     Left Ear: Tympanic membrane normal.  Eyes:     Pupils: Pupils are equal, round, and reactive to light.  Neck:     Thyroid: No thyromegaly.  Cardiovascular:     Rate and Rhythm: Normal rate and regular rhythm.     Heart sounds: Normal heart sounds. No murmur heard. Pulmonary:     Effort: Pulmonary effort is normal. No respiratory distress.     Breath sounds: Normal breath sounds. Decreased air movement present. No wheezing.  Abdominal:     General: Bowel sounds are normal. There is no distension.     Palpations: Abdomen is soft.     Tenderness: There is no abdominal tenderness.  Musculoskeletal:        General: No tenderness. Normal range of motion.     Cervical back: Normal range of motion and neck supple.     Right lower leg: Edema (trace) present.     Left lower leg: Edema (trace) present.  Skin:    General: Skin is warm and dry.  Neurological:     Mental Status: She is alert and oriented to person, place, and time.     Cranial Nerves: No cranial nerve deficit.     Deep Tendon Reflexes: Reflexes are normal and symmetric.  Psychiatric:        Behavior: Behavior normal.        Thought Content: Thought content normal.        Judgment: Judgment normal.        BP (!) 170/83   Pulse 77   Temp 98.2 F (36.8 C) (Temporal)   Ht 5\' 7"  (1.702 m)   Wt 189 lb (85.7 kg)   SpO2 95%   BMI 29.60 kg/m   Assessment & Plan:  Lorraine Leonard comes in today with chief complaint of Medical Management of Chronic Issues and Dysuria   Diagnosis and orders addressed:  1. Dysuria - Urinalysis, Complete - Urine Culture - cefTRIAXone (ROCEPHIN) injection 1 g - CMP14+EGFR - CBC with Differential/Platelet  2. Annual physical exam  (Primary) - CMP14+EGFR - CBC with Differential/Platelet - Lipid panel - TSH - VITAMIN D 25 Hydroxy (Vit-D Deficiency, Fractures)  3. Acute diastolic CHF (congestive heart failure) (HCC) - CMP14+EGFR - CBC with Differential/Platelet  4. Anxiety - CMP14+EGFR - CBC with Differential/Platelet  5. Aorto-iliac atherosclerosis (HCC) - CMP14+EGFR -  CBC with Differential/Platelet - Lipid panel  6. Atrial fibrillation, unspecified type (HCC) - CMP14+EGFR - CBC with Differential/Platelet  7. Chronic obstructive pulmonary disease, unspecified COPD type (HCC) - CMP14+EGFR - CBC with Differential/Platelet  8. Slow transit constipation - CMP14+EGFR - CBC with Differential/Platelet  9. Stage 3 chronic kidney disease, unspecified whether stage 3a or 3b CKD (HCC) - CMP14+EGFR - CBC with Differential/Platelet  10. Moderate episode of recurrent major depressive disorder (HCC) - CMP14+EGFR - CBC with Differential/Platelet  11. Vitamin D deficiency - CMP14+EGFR - CBC with Differential/Platelet - VITAMIN D 25 Hydroxy (Vit-D Deficiency, Fractures)  12. Type 2 diabetes mellitus with diabetic polyneuropathy, without long-term current use of insulin (HCC) - CMP14+EGFR - CBC with Differential/Platelet - Bayer DCA Hb A1c Waived  13. Osteoarthritis of multiple joints, unspecified osteoarthritis type - CMP14+EGFR - CBC with Differential/Platelet  14. Obesity (BMI 30-39.9) - CMP14+EGFR - CBC with Differential/Platelet  15. Hypothyroidism, unspecified type - CMP14+EGFR - CBC with Differential/Platelet - TSH  16. Hypertension associated with diabetes (HCC) - CMP14+EGFR - CBC with Differential/Platelet - Lipid panel  17. Hyperlipidemia associated with type 2 diabetes mellitus (HCC) - CMP14+EGFR - CBC with Differential/Platelet  18. Gastroesophageal reflux disease, unspecified whether esophagitis present - CMP14+EGFR - CBC with Differential/Platelet  19. Acute cystitis with  hematuria Force fluids AZO over the counter X2 days RTO prn Culture pending - cefTRIAXone (ROCEPHIN) injection 1 g - CMP14+EGFR - CBC with Differential/Platelet - cephALEXin (KEFLEX) 500 MG capsule; Take 1 capsule (500 mg total) by mouth 2 (two) times daily.  Dispense: 14 capsule; Refill: 0   Labs pending Rocephin given today, start keflex tomorrow Continue current medications  Low salt diet  Wear compression hose  Health Maintenance reviewed Diet and exercise encouraged  Follow up plan: 3 months    Jannifer Rodney, FNP

## 2023-07-24 LAB — URINE CULTURE

## 2023-07-27 DIAGNOSIS — J449 Chronic obstructive pulmonary disease, unspecified: Secondary | ICD-10-CM | POA: Diagnosis not present

## 2023-07-27 DIAGNOSIS — J8410 Pulmonary fibrosis, unspecified: Secondary | ICD-10-CM | POA: Diagnosis not present

## 2023-07-27 DIAGNOSIS — I509 Heart failure, unspecified: Secondary | ICD-10-CM | POA: Diagnosis not present

## 2023-07-27 NOTE — Telephone Encounter (Signed)
 Copied from CRM [id number]. Topic: Clinical - Lab/Test Results >> Jul 24, 2023  5:03 PM Albin Felling L wrote: Reason for CRM: Daughter informed of urine culture results. Will call back if patient worsens

## 2023-07-27 NOTE — Progress Notes (Signed)
 Remote pacemaker transmission.

## 2023-07-27 NOTE — Addendum Note (Signed)
 Addended by: Geralyn Flash D on: 07/27/2023 01:42 PM   Modules accepted: Orders

## 2023-07-28 ENCOUNTER — Ambulatory Visit: Payer: Self-pay

## 2023-07-28 ENCOUNTER — Other Ambulatory Visit: Payer: Self-pay | Admitting: Family

## 2023-07-28 MED ORDER — AMOXICILLIN-POT CLAVULANATE 875-125 MG PO TABS: 1.0000 | ORAL_TABLET | Freq: Two times a day (BID) | ORAL | 0 refills | Status: DC

## 2023-07-28 NOTE — Telephone Encounter (Signed)
Augmentin Prescription sent to pharmacy   

## 2023-07-28 NOTE — Telephone Encounter (Signed)
 See triage encounter.

## 2023-07-28 NOTE — Telephone Encounter (Signed)
 2nd attempt Patient's daughter called, left VM to return the call to the office to speak to NT.    Copied from CRM [id number]. Topic: Clinical - Medical Advice >> Jul 28, 2023  1:47 PM Everette C wrote: Reason for CRM: The patient's daughter has called back, as directed by clinical staff, to share that the patient is continuing to experience discomfort when urinating/ This is their only remaining symptom.   Please contact to discuss further when possible

## 2023-07-28 NOTE — Telephone Encounter (Signed)
   Chief Complaint: pain  Symptoms: burning   Disposition: [] ED /[] Urgent Care (no appt availability in office) / [] Appointment(In office/virtual)/ []  [facility]/ [] Home Care/ [] Refused Recommended Disposition /[] [facility]/ [x]  Follow-up with PCP Additional Notes: Pt's daughter Delorise Jackson called with follow up on UTI. Pt took last abx today and is still complaining with "pain where I pee." Tori shared pt feels better overall but does have the pain and some burning. Delorise Jackson is hoping another abx can be called in without an appt. RN didn't make an appt at this time. RN gave care advice and Tori verbalized understanding. Please follow up with Tori [phone]) if pt needs to return to office and/or new medication can be called without a visit.            Copied from CRM [id number]. Topic: Clinical - Medical Advice >> Jul 28, 2023  1:47 PM Everette C wrote: Reason for CRM: The patient's daughter has called back, as directed by clinical staff, to share that the patient is continuing to experience discomfort when urinating/ This is their only remaining symptom.  Please contact to discuss further when possible Reason for Disposition  Urinating more frequently than usual (i.e., frequency)  Answer Assessment - Initial Assessment Questions 1. SYMPTOM: "What's the main symptom you're concerned about?" (e.g., frequency, incontinence)     Burning and pain  2. ONSET: "When did the   start?"     Not sure  3. PAIN: "Is there any pain?" If Yes, ask: "How bad is it?" (Scale: 1-10; mild, moderate, severe)     Not sure  4. CAUSE: "What do you think is causing the symptoms?"     UTI  5. OTHER SYMPTOMS: "Do you have any other symptoms?" (e.g., blood in urine, fever, flank pain, pain with urination)     Burning and pain  Protocols used: Urinary Symptoms-A-AH

## 2023-07-28 NOTE — Telephone Encounter (Signed)
 1st attempt. Patient's daughter called, left VM to return the call to the office to speak to NT.   Copied from CRM [id number]. Topic: Clinical - Medical Advice >> Jul 28, 2023  1:47 PM Everette C wrote: Reason for CRM: The patient's daughter has called back, as directed by clinical staff, to share that the patient is continuing to experience discomfort when urinating/ This is their only remaining symptom.  Please contact to discuss further when possible

## 2023-08-09 ENCOUNTER — Other Ambulatory Visit: Payer: Self-pay | Admitting: Family

## 2023-08-16 ENCOUNTER — Other Ambulatory Visit: Payer: Self-pay | Admitting: Family

## 2023-08-19 ENCOUNTER — Other Ambulatory Visit: Payer: Self-pay | Admitting: Family

## 2023-08-19 DIAGNOSIS — E559 Vitamin D deficiency, unspecified: Secondary | ICD-10-CM

## 2023-08-25 ENCOUNTER — Other Ambulatory Visit: Payer: Self-pay | Admitting: Family

## 2023-08-25 DIAGNOSIS — R0982 Postnasal drip: Secondary | ICD-10-CM

## 2023-08-26 ENCOUNTER — Other Ambulatory Visit: Payer: Self-pay | Admitting: Family

## 2023-08-26 DIAGNOSIS — J8410 Pulmonary fibrosis, unspecified: Secondary | ICD-10-CM | POA: Diagnosis not present

## 2023-08-26 DIAGNOSIS — I509 Heart failure, unspecified: Secondary | ICD-10-CM | POA: Diagnosis not present

## 2023-08-26 DIAGNOSIS — J449 Chronic obstructive pulmonary disease, unspecified: Secondary | ICD-10-CM | POA: Diagnosis not present

## 2023-09-03 ENCOUNTER — Other Ambulatory Visit: Payer: Self-pay | Admitting: Family

## 2023-09-03 DIAGNOSIS — I152 Hypertension secondary to endocrine disorders: Secondary | ICD-10-CM

## 2023-09-10 ENCOUNTER — Encounter (HOSPITAL_COMMUNITY): Payer: Self-pay

## 2023-09-10 ENCOUNTER — Ambulatory Visit: Payer: Self-pay | Admitting: Family Medicine

## 2023-09-10 ENCOUNTER — Ambulatory Visit (HOSPITAL_COMMUNITY): Admission: RE | Admit: 2023-09-10 | Source: Ambulatory Visit

## 2023-09-10 ENCOUNTER — Ambulatory Visit: Admitting: Family Medicine

## 2023-09-10 ENCOUNTER — Emergency Department (HOSPITAL_COMMUNITY)
Admission: EM | Admit: 2023-09-10 | Discharge: 2023-09-10 | Discharge status: Against Medical Advice | Attending: Emergency Medicine | Admitting: Emergency Medicine

## 2023-09-10 ENCOUNTER — Encounter: Payer: Self-pay | Admitting: Family Medicine

## 2023-09-10 ENCOUNTER — Other Ambulatory Visit: Payer: Self-pay

## 2023-09-10 ENCOUNTER — Emergency Department (HOSPITAL_COMMUNITY)

## 2023-09-10 VITALS — BP 136/57 | HR 71 | Temp 98.2°F

## 2023-09-10 DIAGNOSIS — T148XXA Other injury of unspecified body region, initial encounter: Secondary | ICD-10-CM | POA: Diagnosis not present

## 2023-09-10 DIAGNOSIS — S8992XA Unspecified injury of left lower leg, initial encounter: Secondary | ICD-10-CM | POA: Diagnosis present

## 2023-09-10 DIAGNOSIS — X58XXXA Exposure to other specified factors, initial encounter: Secondary | ICD-10-CM | POA: Diagnosis not present

## 2023-09-10 DIAGNOSIS — R350 Frequency of micturition: Secondary | ICD-10-CM

## 2023-09-10 DIAGNOSIS — Z7901 Long term (current) use of anticoagulants: Secondary | ICD-10-CM | POA: Diagnosis not present

## 2023-09-10 DIAGNOSIS — D649 Anemia, unspecified: Secondary | ICD-10-CM

## 2023-09-10 DIAGNOSIS — K921 Melena: Secondary | ICD-10-CM

## 2023-09-10 DIAGNOSIS — R300 Dysuria: Secondary | ICD-10-CM | POA: Diagnosis not present

## 2023-09-10 DIAGNOSIS — Z5321 Procedure and treatment not carried out due to patient leaving prior to being seen by health care provider: Secondary | ICD-10-CM | POA: Insufficient documentation

## 2023-09-10 DIAGNOSIS — R2242 Localized swelling, mass and lump, left lower limb: Secondary | ICD-10-CM

## 2023-09-10 DIAGNOSIS — M79662 Pain in left lower leg: Secondary | ICD-10-CM | POA: Diagnosis not present

## 2023-09-10 DIAGNOSIS — S8012XA Contusion of left lower leg, initial encounter: Secondary | ICD-10-CM | POA: Diagnosis not present

## 2023-09-10 DIAGNOSIS — R899 Unspecified abnormal finding in specimens from other organs, systems and tissues: Secondary | ICD-10-CM | POA: Diagnosis not present

## 2023-09-10 DIAGNOSIS — N3090 Cystitis, unspecified without hematuria: Secondary | ICD-10-CM

## 2023-09-10 LAB — HEMOGLOBIN, FINGERSTICK: Hemoglobin: 9 g/dL — ABNORMAL LOW (ref 11.1–15.9)

## 2023-09-10 LAB — MICROSCOPIC EXAMINATION
RBC, Urine: NONE SEEN /HPF (ref 0–2)
Renal Epithel, UA: NONE SEEN /HPF
WBC, UA: >30 /HPF — AB (ref 0–5)
Yeast, UA: NONE SEEN

## 2023-09-10 LAB — URINALYSIS, ROUTINE W REFLEX MICROSCOPIC
Bilirubin, UA: NEGATIVE
Glucose, UA: NEGATIVE
Ketones, UA: NEGATIVE
Nitrite, UA: NEGATIVE
Protein,UA: NEGATIVE
RBC, UA: NEGATIVE
Specific Gravity, UA: 1.01 (ref 1.005–1.030)
Urobilinogen, Ur: 0.2 mg/dL (ref 0.2–1.0)
pH, UA: 5.5 (ref 5.0–7.5)

## 2023-09-10 NOTE — Progress Notes (Signed)
 Subjective:  Patient ID: Lorraine Leonard, female    DOB: December 03, 1940, 83 y.o.   MRN: [id number]  Patient Care Team: Yevette Hem, FNP as PCP - General (Family Medicine) Tammie Fall, MD as PCP - Electrophysiology (Cardiology) Amanda Jungling Joyceann No, MD as PCP - Cardiology (Cardiology) Diamond Formica, MD as Consulting Physician (Pulmonary Disease) Trent Frizzle, MD as Consulting Physician (Urology) Alexia Idler, [place] (Optometry)   Chief Complaint:  Edema (Left lower leg after hitting her leg with her right heel ) and Dysuria (X 1 week )   HPI: Lorraine Leonard is a 83 y.o. female presenting on 09/10/2023 for Edema (Left lower leg after hitting her leg with her right heel ) and Dysuria (X 1 week )   Lorraine Leonard is an 83 year old female on blood thinners who presents with a large hematoma on her leg.  She developed a large hematoma on her leg after accidentally hitting it while getting into bed last night. She experiences significant pain, swelling, tightness, and discomfort in the area. She is currently on Eliquis , a blood thinner, which she takes twice daily and confirms taking her dose this morning.  Over the past two to three weeks, she has experienced urinary issues, including frequency. She recalls a previous urinary tract infection that was treated with antibiotics. She is concerned about a lingering infection, although her current symptoms are not as severe as before.  She has a history of gastrointestinal symptoms, including episodes of melena, which she associates with her diet, specifically mentioning teriyaki sauce. She uses suppositories and cream for hemorrhoids, which have caused difficulty with bowel movements. She does not recall having a gastrointestinal doctor.  She notes a history of a dog bite on her leg, which caused swelling and discoloration, but states that the condition has improved. She also mentions difficulty with dependent edema, particularly when her  legs are down, and uses pillows to elevate her legs at night. She owns compression socks but finds them difficult to manage.  No palpitations or heart racing. She uses oxygen  for shortness of breath but does not have a portable tank with her.          Relevant past medical, surgical, family, and social history reviewed and updated as indicated.  Allergies and medications reviewed and updated. Data reviewed: Chart in Epic.   Past Medical History:  Diagnosis Date   Allergic rhinitis    Anxiety    Anxiety    Arthritis    "~ all my joints" (05/18/2017)   Atrial fibrillation (HCC) 09/2016   Atrial fibrillation with RVR (HCC) 05/18/2017   Chronic heart failure with preserved ejection fraction (HFpEF) (HCC)    Chronic kidney disease, stage 3a (HCC)    Chronic lower back pain    COPD (chronic obstructive pulmonary disease) (HCC)    Depression    Diverticulosis    GERD (gastroesophageal reflux disease)    Glaucoma    Gout    History of blood transfusion 1983   "when I gave a 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"   Hyperlipidemia    Hypertension    Hypothyroidism    Macular degeneration of both eyes    Neuropathy    On home oxygen  therapy    "3L; 24/7" (05/18/2017)   Pre-diabetes    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   Pulmonary fibrosis (HCC)    Rheumatoid arthritis (HCC)    Sleep apnea    uses 3 liters of  o2 24/7   Urticaria     Past Surgical History:  Procedure Laterality Date   AV NODE ABLATION  05/18/2017   AV NODE ABLATION N/A 05/18/2017   Procedure: AV NODE ABLATION;  Surgeon: Tammie Fall, MD;  Location: [facility];  Service: Cardiovascular;  Laterality: N/A;   BACK SURGERY     BREAST LUMPECTOMY Left    CARDIOVERSION N/A 01/30/2017   Procedure: CARDIOVERSION;  Surgeon: Loyde Rule, MD;  Location: [facility];  Service: Cardiovascular;  Laterality: N/A;   CARDIOVERSION N/A 04/09/2017   Procedure: CARDIOVERSION;  Surgeon: Gerard Knight, MD;   Location: [facility];  Service: Cardiovascular;  Laterality: N/A;   CATARACT EXTRACTION W/ INTRAOCULAR LENS  IMPLANT, BILATERAL Bilateral    DILATION AND CURETTAGE OF UTERUS     EUS N/A 10/03/2015   Procedure: UPPER ENDOSCOPIC ULTRASOUND (EUS) RADIAL;  Surgeon: Evangeline Hilts, MD;  Location: [facility];  Service: Endoscopy;  Laterality: N/A;   FINE NEEDLE ASPIRATION N/A 10/03/2015   Procedure: FINE NEEDLE ASPIRATION (FNA) RADIAL;  Surgeon: Evangeline Hilts, MD;  Location: [facility];  Service: Endoscopy;  Laterality: N/A;   INSERT / REPLACE / REMOVE PACEMAKER  05/18/2017   LUMBAR LAMINECTOMY  1995; 06/23/2008   L4-5/notes 08/20/2010   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   NEPHRECTOMY TRANSPLANTED ORGAN     PACEMAKER IMPLANT N/A 05/18/2017   Procedure: PACEMAKER IMPLANT;  Surgeon: Tammie Fall, MD;  Location: [facility];  Service: Cardiovascular;  Laterality: N/A;   TOTAL ABDOMINAL HYSTERECTOMY     TUBAL LIGATION      Social History   Socioeconomic History   Marital status: Widowed    Spouse name: Amelda Hapke   Number of children: 4   Years of education: 9   Highest education level: 9th grade  Occupational History   Occupation: retired    Comment: cna  Tobacco Use   Smoking status: Never    Passive exposure: Never   Smokeless tobacco: Never  Vaping Use   Vaping status: Never Used  Substance and Sexual Activity   Alcohol  use: No   Drug use: No   Sexual activity: Not Currently    Partners: Male    Birth control/protection: Surgical  Other Topics Concern   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   Pt lives alone in single wide trailer. They have 4 children and 13 grandchildren.    Social Drivers of Corporate investment banker Strain: Low Risk  (03/28/2022)   Overall Financial Resource Strain (CARDIA)    Difficulty of Paying Living Expenses: Not hard at all  Food  Insecurity: No Food Insecurity (03/28/2022)   Hunger Vital Sign    Worried About Running Out of Food in the Last Year: Never true    Ran Out of Food in the Last Year: Never true  Transportation Needs: No Transportation Needs (03/28/2022)   PRAPARE - Administrator, Civil Service (Medical): No    Lack of Transportation (Non-Medical): No  Physical Activity: Insufficiently Active (03/28/2022)   Exercise Vital Sign    Days of Exercise per Week: 3 days    Minutes of Exercise per Session: 30 min  Stress: No Stress Concern Present (03/28/2022)   [employer] of Occupational Health - Occupational Stress Questionnaire    Feeling of Stress : Not  at all  Social Connections: Moderately Isolated (03/28/2022)   Social Connection and Isolation Panel [NHANES]    Frequency of Communication with Friends and Family: More than three times a week    Frequency of Social Gatherings with Friends and Family: More than three times a week    Attends Religious Services: 1 to 4 times per year    Active Member of [employer] or Organizations: No    Attends Banker Meetings: Never    Marital Status: Widowed  Intimate Partner Violence: Not At Risk (03/28/2022)   Humiliation, Afraid, Rape, and Kick questionnaire    Fear of Current or Ex-Partner: No    Emotionally Abused: No    Physically Abused: No    Sexually Abused: No    Outpatient Encounter Medications as of 09/10/2023  Medication Sig   albuterol  (VENTOLIN  HFA) 108 (90 Base) MCG/ACT inhaler INHALE 2 PUFFS INTO THE LUNGS EVERY 6 HOURS AS NEEDED FOR WHEEZE OR SHORTNESS OF BREATH   apixaban  (ELIQUIS ) 5 MG TABS tablet TAKE 1 TABLET BY MOUTH TWICE A DAY   atenolol  (TENORMIN ) 25 MG tablet TAKE 1 TABLET (25 MG TOTAL) BY MOUTH DAILY.   calcium  citrate (CALCITRATE - DOSED IN MG ELEMENTAL CALCIUM ) 950 MG tablet Take 1 tablet by mouth 2 (two) times daily.    cephALEXin  (KEFLEX ) 500 MG capsule Take 1 capsule (500 mg total) by mouth 2 (two) times daily.    cyanocobalamin  1000 MCG tablet Take 1,000 mcg by mouth daily. Vitamin b12 every AM   diclofenac  Sodium (VOLTAREN ) 1 % GEL APPLY 4 GRAMS TOPICALLY 4 TIMES A DAY   estradiol  (ESTRACE  VAGINAL) 0.1 MG/GM vaginal cream Place 1 Applicatorful vaginally at bedtime.   estradiol  (ESTRACE ) 0.1 MG/GM vaginal cream Place 1 g vaginally daily.   fluticasone  (FLONASE ) 50 MCG/ACT nasal spray Place 1 spray into both nostrils 2 (two) times daily as needed for allergies or rhinitis.   furosemide  (LASIX ) 40 MG tablet TAKE 1 TABLET BY MOUTH TWICE A DAY   levocetirizine (XYZAL ) 5 MG tablet TAKE 1 TABLET BY MOUTH EVERY DAY IN THE EVENING   levothyroxine  (SYNTHROID ) 100 MCG tablet TAKE 1 TABLET BY MOUTH EVERY DAY   metFORMIN  (GLUCOPHAGE -XR) 500 MG 24 hr tablet TAKE 1 TABLET (500 MG TOTAL) BY MOUTH DAILY WITH BREAKFAST. **NEEDS TO BE SEEN BEFORE NEXT REFILL**   Multiple Vitamins-Minerals (PRESERVISION AREDS 2) CAPS Take 1 capsule by mouth 2 (two) times daily.   Olopatadine  HCl 0.2 % SOLN INSTILL 1 DROP INTO BOTH EYES EVERY DAY   omeprazole  (PRILOSEC) 20 MG capsule Take 1 capsule (20 mg total) by mouth daily.   ondansetron  (ZOFRAN ) 4 MG tablet Take 1 tablet (4 mg total) by mouth every 8 (eight) hours as needed for nausea or vomiting.   OXYGEN  Inhale 3 L into the lungs every evening.   Polyethyl Glycol-Propyl Glycol (SYSTANE) 0.4-0.3 % GEL ophthalmic gel Place 1 application into both eyes at bedtime as needed (dry eyes).   predniSONE  (DELTASONE ) 5 MG tablet TAKE 1 TABLET (5 MG TOTAL) BY MOUTH 2 (TWO) TIMES DAILY WITH A MEAL.   rosuvastatin  (CRESTOR ) 10 MG tablet TAKE 1 TABLET BY MOUTH EVERYDAY AT BEDTIME **NEEDS TO BE SEEN BEFORE NEXT REFILL**   spironolactone  (ALDACTONE ) 25 MG tablet TAKE 1 TABLET (25 MG TOTAL) BY MOUTH DAILY. **NEEDS TO BE SEEN BEFORE NEXT REFILL**   vitamin C  (ASCORBIC ACID ) 500 MG tablet Take 1,000 mg by mouth daily. AM   Vitamin D , Ergocalciferol , (DRISDOL ) 1.25 MG (50000  UNIT) CAPS capsule TAKE 1  CAPSULE (50,000 UNITS TOTAL) BY MOUTH EVERY 7 (SEVEN) DAYS   [DISCONTINUED] amoxicillin -clavulanate (AUGMENTIN ) 875-125 MG tablet Take 1 tablet by mouth 2 (two) times daily.   No facility-administered encounter medications on file as of 09/10/2023.    Allergies  Allergen Reactions   Allopurinol  Swelling    Feet and legs swell   Mobic  [Meloxicam ] Swelling and Other (See Comments)    Feet and Leg swelling   Macrodantin  [Nitrofurantoin  Macrocrystal]     Pertinent ROS per HPI, otherwise unremarkable      Objective:  BP (!) 136/57   Pulse 71   Temp 98.2 F (36.8 C)   SpO2 94%    Wt Readings from Last 3 Encounters:  07/21/23 189 lb (85.7 kg)  06/29/23 190 lb (86.2 kg)  06/08/23 183 lb 6.4 oz (83.2 kg)    Physical Exam Vitals and nursing note reviewed.  Constitutional:      Appearance: She is ill-appearing (chronically ill, frail elderly).  HENT:     Head: Normocephalic and atraumatic.     Mouth/Throat:     Mouth: Mucous membranes are moist.  Eyes:     Pupils: Pupils are equal, round, and reactive to light.  Cardiovascular:     Rate and Rhythm: Normal rate. Rhythm irregularly irregular.     Pulses: Normal pulses.     Heart sounds: Normal heart sounds.  Pulmonary:     Effort: Pulmonary effort is normal.     Breath sounds: Decreased breath sounds present. No wheezing, rhonchi or rales.  Musculoskeletal:     Cervical back: Neck supple.     Right lower leg: 2+ Edema present.     Left lower leg: 2+ Edema present.  Skin:    General: Skin is warm and dry.     Capillary Refill: Capillary refill takes less than 2 seconds.     Coloration: Skin is pale.     Findings: Bruising and ecchymosis present.     Comments: Large hematoma with ecchymosis to left lower leg.   Neurological:     General: No focal deficit present.     Mental Status: She is alert and oriented to person, place, and time.     Gait: Gait abnormal (in wheelchair).  Psychiatric:        Mood and Affect: Mood  normal.        Behavior: Behavior normal.        Thought Content: Thought content normal.        Judgment: Judgment normal.        Results for orders placed or performed in visit on 07/21/23  Urine Culture   Collection Time: 07/21/23  1:53 PM   Specimen: Urine   UR  Result Value Ref Range   Urine Culture, Routine Final report (A)    Organism ID, Bacteria Klebsiella pneumoniae (A)    Antimicrobial Susceptibility Comment   Microscopic Examination   Collection Time: 07/21/23  1:53 PM   Urine  Result Value Ref Range   WBC, UA >30 (A) 0 - 5 /hpf   RBC, Urine >30 (A) 0 - 2 /hpf   Epithelial Cells (non renal) None seen 0 - 10 /hpf   Renal Epithel, UA None seen None seen /hpf   Bacteria, UA Moderate (A) None seen/Few   Yeast, UA None seen None seen  Urinalysis, Complete   Collection Time: 07/21/23  1:53 PM  Result Value Ref Range   Specific Gravity, UA 1.020 1.005 -  1.030   pH, UA 5.5 5.0 - 7.5   Color, UA Yellow Yellow   Appearance Ur Cloudy (A) Clear   Leukocytes,UA 3+ (A) Negative   Protein,UA 2+ (A) Negative/Trace   Glucose, UA Negative Negative   Ketones, UA Negative Negative   RBC, UA 2+ (A) Negative   Bilirubin, UA Negative Negative   Urobilinogen, Ur 0.2 0.2 - 1.0 mg/dL   Nitrite, UA Negative Negative   Microscopic Examination See below:    *Note: Due to a large number of results and/or encounters for the requested time period, some results have not been displayed. A complete set of results can be found in Results Review.       Pertinent labs & imaging results that were available during my care of the patient were reviewed by me and considered in my medical decision making.  Assessment & Plan:  Lorraine Leonard was seen today for edema and dysuria.  Diagnoses and all orders for this visit:  Hematoma and contusion -     US  LT LOWER EXTREM LTD SOFT TISSUE NON VASCULAR; Future -     Hemoglobin, fingerstick -     Protime-INR  Localized swelling of left lower leg -      US  LT LOWER EXTREM LTD SOFT TISSUE NON VASCULAR; Future -     Hemoglobin, fingerstick -     Brain natriuretic peptide -     CMP14+EGFR  Anticoagulant long-term use -     Hemoglobin, fingerstick -     Protime-INR  Frequency of urination -     Urine Culture -     Urinalysis, Routine w reflex microscopic  Low hemoglobin -     Fecal occult blood, imunochemical; Future -     Anemia Profile B -     Protime-INR  Black tarry stools -     Fecal occult blood, imunochemical; Future -     Anemia Profile B -     Protime-INR     Hematoma of the leg Acute hematoma of the leg following trauma with significant bruising and swelling. She is on Eliquis , increasing bleeding risk. Hemoglobin dropped from 12.2 to 9, indicating possible blood loss. Differential includes ongoing bleeding from the hematoma or gastrointestinal bleeding, suggested by black tarry stools. Urgent ultrasound is necessary to assess hematoma extent and potential need for surgical intervention. - Order urgent ultrasound of the leg to assess the hematoma. - Refer to orthopedics if ultrasound shows a large hematoma for potential surgical intervention. - Check anemia profile to assess for iron, ferritin, and B12 deficiencies. - Monitor for signs of gastrointestinal bleeding. - Schedule follow-up appointment next Thursday to reevaluate condition.  Anemia Anemia with hemoglobin drop from 12.2 to 9. Possible causes include blood loss from the hematoma or gastrointestinal bleeding, indicated by black tarry stools. Further evaluation of anemia profile is needed to determine deficiencies. - Check anemia profile to assess for iron, ferritin, and B12 deficiencies. - Monitor for signs of gastrointestinal bleeding.  Urinary frequency Intermittent urinary frequency for 2-3 weeks. Previous urinary tract infection treated with antibiotics. Current urinalysis shows one plus leukocytes, but no significant infection markers. Culture will confirm  if further treatment is necessary. - Culture urine sample to rule out ongoing infection. - Monitor urinary symptoms and treat if culture indicates infection.  Dependent edema Mild dependent edema likely due to prolonged sitting with legs down. No significant concerns during examination. Compression socks and leg elevation recommended to manage symptoms. - Encourage use of compression socks to  manage edema. - Advise elevating legs when possible to reduce swelling.     Continue all other maintenance medications.  Follow up plan: Return in about 1 week (around 09/17/2023), or if symptoms worsen or fail to improve, for hematoma .   Continue healthy lifestyle choices, including diet (rich in fruits, vegetables, and lean proteins, and low in salt and simple carbohydrates) and exercise (at least 30 minutes of moderate physical activity daily).  Educational handout given for hematoma   The above assessment and management plan was discussed with the patient. The patient verbalized understanding of and has agreed to the management plan. Patient is aware to call the clinic if they develop any new symptoms or if symptoms persist or worsen. Patient is aware when to return to the clinic for a follow-up visit. Patient educated on when it is appropriate to go to the emergency department.   Kattie Parrot, FNP-C [facility] [phone]

## 2023-09-10 NOTE — ED Triage Notes (Signed)
 Pt son reports:  Leg injury (Left) Hit one leg on the other during the night Happened last night Bruising and swelling Pain with movement Sent by PCP 09/10/23 Request for US  on right lower leg

## 2023-09-11 LAB — CMP14+EGFR
ALT: 16 IU/L (ref 0–32)
AST: 22 IU/L (ref 0–40)
Albumin: 4.1 g/dL (ref 3.7–4.7)
Alkaline Phosphatase: 64 IU/L (ref 44–121)
BUN/Creatinine Ratio: 21 (ref 12–28)
BUN: 25 mg/dL (ref 8–27)
Bilirubin Total: 0.3 mg/dL (ref 0.0–1.2)
CO2: 19 mmol/L — ABNORMAL LOW (ref 20–29)
Calcium: 10.2 mg/dL (ref 8.7–10.3)
Chloride: 101 mmol/L (ref 96–106)
Creatinine, Ser: 1.18 mg/dL — ABNORMAL HIGH (ref 0.57–1.00)
Globulin, Total: 3 g/dL (ref 1.5–4.5)
Glucose: 217 mg/dL — ABNORMAL HIGH (ref 70–99)
Potassium: 5.3 mmol/L — ABNORMAL HIGH (ref 3.5–5.2)
Sodium: 140 mmol/L (ref 134–144)
Total Protein: 7.1 g/dL (ref 6.0–8.5)
eGFR: 46 mL/min/{1.73_m2} — ABNORMAL LOW (ref >59)

## 2023-09-11 LAB — BRAIN NATRIURETIC PEPTIDE: BNP: 441.3 pg/mL — ABNORMAL HIGH (ref 0.0–100.0)

## 2023-09-11 LAB — ANEMIA PROFILE B
Basophils Absolute: 0.1 10*3/uL (ref 0.0–0.2)
Basos: 1 %
EOS (ABSOLUTE): 0.1 10*3/uL (ref 0.0–0.4)
Eos: 1 %
Ferritin: 19 ng/mL (ref 15–150)
Folate: >20 ng/mL (ref >3.0)
Hematocrit: 33.7 % — ABNORMAL LOW (ref 34.0–46.6)
Hemoglobin: 9.9 g/dL — ABNORMAL LOW (ref 11.1–15.9)
Immature Grans (Abs): 0 10*3/uL (ref 0.0–0.1)
Immature Granulocytes: 0 %
Iron Saturation: 7 % — CL (ref 15–55)
Iron: 31 ug/dL (ref 27–139)
Lymphocytes Absolute: 2 10*3/uL (ref 0.7–3.1)
Lymphs: 17 %
MCH: 23.2 pg — ABNORMAL LOW (ref 26.6–33.0)
MCHC: 29.4 g/dL — ABNORMAL LOW (ref 31.5–35.7)
MCV: 79 fL (ref 79–97)
Monocytes Absolute: 1 10*3/uL — ABNORMAL HIGH (ref 0.1–0.9)
Monocytes: 9 %
Neutrophils Absolute: 8.4 10*3/uL — ABNORMAL HIGH (ref 1.4–7.0)
Neutrophils: 72 %
Platelets: 315 10*3/uL (ref 150–450)
RBC: 4.27 x10E6/uL (ref 3.77–5.28)
RDW: 16.4 % — ABNORMAL HIGH (ref 11.7–15.4)
Retic Ct Pct: 1.9 % (ref 0.6–2.6)
Total Iron Binding Capacity: 435 ug/dL (ref 250–450)
UIBC: 404 ug/dL — ABNORMAL HIGH (ref 118–369)
Vitamin B-12: 1083 pg/mL (ref 232–1245)
WBC: 11.6 10*3/uL — ABNORMAL HIGH (ref 3.4–10.8)

## 2023-09-11 LAB — PROTIME-INR
INR: 1.1 (ref 0.9–1.2)
Prothrombin Time: 12.3 s — ABNORMAL HIGH (ref 9.1–12.0)

## 2023-09-13 LAB — URINE CULTURE

## 2023-09-14 MED ORDER — SULFAMETHOXAZOLE-TRIMETHOPRIM 800-160 MG PO TABS: 1.0000 | ORAL_TABLET | Freq: Two times a day (BID) | ORAL | 0 refills | Status: AC

## 2023-09-16 ENCOUNTER — Other Ambulatory Visit: Payer: Self-pay | Admitting: Family

## 2023-09-16 DIAGNOSIS — E1142 Type 2 diabetes mellitus with diabetic polyneuropathy: Secondary | ICD-10-CM

## 2023-09-16 DIAGNOSIS — J019 Acute sinusitis, unspecified: Secondary | ICD-10-CM

## 2023-09-16 DIAGNOSIS — R0982 Postnasal drip: Secondary | ICD-10-CM

## 2023-09-17 ENCOUNTER — Ambulatory Visit: Admitting: Family

## 2023-09-17 ENCOUNTER — Encounter: Payer: Self-pay | Admitting: Family

## 2023-09-17 VITALS — BP 133/70 | HR 71 | Temp 97.7°F | Ht 67.0 in

## 2023-09-17 DIAGNOSIS — I509 Heart failure, unspecified: Secondary | ICD-10-CM

## 2023-09-17 DIAGNOSIS — N3000 Acute cystitis without hematuria: Secondary | ICD-10-CM | POA: Diagnosis not present

## 2023-09-17 DIAGNOSIS — D500 Iron deficiency anemia secondary to blood loss (chronic): Secondary | ICD-10-CM | POA: Diagnosis not present

## 2023-09-17 DIAGNOSIS — S8012XA Contusion of left lower leg, initial encounter: Secondary | ICD-10-CM

## 2023-09-17 DIAGNOSIS — I4891 Unspecified atrial fibrillation: Secondary | ICD-10-CM

## 2023-09-17 DIAGNOSIS — E875 Hyperkalemia: Secondary | ICD-10-CM | POA: Diagnosis not present

## 2023-09-17 DIAGNOSIS — S8012XD Contusion of left lower leg, subsequent encounter: Secondary | ICD-10-CM | POA: Diagnosis not present

## 2023-09-17 MED ORDER — FUROSEMIDE 20 MG PO TABS: 20.0000 mg | ORAL_TABLET | Freq: Every day | ORAL | 3 refills | Status: DC

## 2023-09-17 MED ORDER — IRON (FERROUS SULFATE) 325 (65 FE) MG PO TABS: 325.0000 mg | ORAL_TABLET | Freq: Every day | ORAL | 1 refills | Status: DC

## 2023-09-17 NOTE — Progress Notes (Signed)
 Subjective:    Patient ID: Lorraine Leonard, female    DOB: 12-17-1940, 83 y.o.   MRN: [id number]  Chief Complaint  Patient presents with   Follow-up   Pt presents to the office today to follow up on hematoma on left lower leg. She was seen on 09/10/23 with a large hematoma after an injury.   She is on Eliquis  for A Fib. Stable. Her hgb dropped from 12. 2 to 9.  Her iron was low, potassium elevated, BNP elevated, elevated WBC,  and positive urine culture of Klebsiella pneumoniae. She is was given bactrim  and is still taking this.   Pt reports she does not take her lasix  every day because it makes her go to the bathroom too much.  Hypertension This is a chronic problem. The current episode started more than 1 year ago. The problem has been waxing and waning since onset. The problem is uncontrolled. Associated symptoms include malaise/fatigue, peripheral edema and shortness of breath. Past treatments include diuretics and lifestyle changes. The current treatment provides mild improvement.  Congestive Heart Failure Presents for follow-up visit. Associated symptoms include edema, fatigue and shortness of breath. The symptoms have been stable.      Review of Systems  Constitutional:  Positive for fatigue and malaise/fatigue.  Respiratory:  Positive for shortness of breath.   All other systems reviewed and are negative.   Social History   Socioeconomic History   Marital status: Widowed    Spouse name: Waldine Zenz   Number of children: 4   Years of education: 9   Highest education level: 9th grade  Occupational History   Occupation: retired    Comment: cna  Tobacco Use   Smoking status: Never    Passive exposure: Never   Smokeless tobacco: Never  Vaping Use   Vaping status: Never Used  Substance and Sexual Activity   Alcohol  use: No   Drug use: No   Sexual activity: Not Currently    Partners: Male    Birth control/protection: Surgical  Other Topics Concern   Not on file   Social History Narrative   Pt ONLY HAS ONE KIDNEY, 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   Pt lives alone in single wide trailer. They have 4 children and 13 grandchildren.    Social Drivers of Corporate investment banker Strain: Low Risk  (03/28/2022)   Overall Financial Resource Strain (CARDIA)    Difficulty of Paying Living Expenses: Not hard at all  Food Insecurity: No Food Insecurity (03/28/2022)   Hunger Vital Sign    Worried About Running Out of Food in the Last Year: Never true    Ran Out of Food in the Last Year: Never true  Transportation Needs: No Transportation Needs (03/28/2022)   PRAPARE - Administrator, Civil Service (Medical): No    Lack of Transportation (Non-Medical): No  Physical Activity: Insufficiently Active (03/28/2022)   Exercise Vital Sign    Days of Exercise per Week: 3 days    Minutes of Exercise per Session: 30 min  Stress: No Stress Concern Present (03/28/2022)   [employer] of Occupational Health - Occupational Stress Questionnaire    Feeling of Stress : Not at all  Social Connections: Moderately Isolated (03/28/2022)   Social Connection and Isolation Panel [NHANES]    Frequency of Communication with Friends and Family: More than three times a week    Frequency of Social Gatherings with Friends and Family: More than three  times a week    Attends Religious Services: 1 to 4 times per year    Active Member of Clubs or Organizations: No    Attends Banker Meetings: Never    Marital Status: Widowed   Family History  Problem Relation Age of Onset   Diabetes Other    Hypertension Other    Coronary artery disease Other    Stroke Other    Asthma Other    Kidney disease Sister    Cancer Sister        lung   Lung cancer Sister    Cancer Sister        lung   Lung cancer Sister    Cancer Sister        lung   Suicidality Son        committed suicide in 2009   CVA Son    Hyperlipidemia Other    Anxiety disorder  Other    Migraines Other    Heart failure Father    Healthy Brother    Cancer Sister        lung   Healthy Sister         Objective:   Physical Exam Vitals reviewed.  Constitutional:      General: She is not in acute distress.    Appearance: She is well-developed.  HENT:     Head: Normocephalic and atraumatic.  Eyes:     Pupils: Pupils are equal, round, and reactive to light.  Neck:     Thyroid : No thyromegaly.  Cardiovascular:     Rate and Rhythm: Normal rate and regular rhythm.     Heart sounds: Normal heart sounds. No murmur heard. Pulmonary:     Effort: Pulmonary effort is normal. No respiratory distress.     Breath sounds: Normal breath sounds. No wheezing.  Abdominal:     General: Bowel sounds are normal. There is no distension.     Palpations: Abdomen is soft.     Tenderness: There is no abdominal tenderness.  Musculoskeletal:        General: No tenderness. Normal range of motion.     Cervical back: Normal range of motion and neck supple.  Skin:    General: Skin is warm and dry.          Comments: Hematoma on left lower leg  Neurological:     Mental Status: She is alert and oriented to person, place, and time.     Cranial Nerves: No cranial nerve deficit.     Motor: Weakness (in wheelchair) present.     Deep Tendon Reflexes: Reflexes are normal and symmetric.  Psychiatric:        Behavior: Behavior normal.        Thought Content: Thought content normal.        Judgment: Judgment normal.       BP 133/70   Pulse 71   Temp 97.7 F (36.5 C) (Temporal)   Ht 5\' 7"  (1.702 m)   BMI 29.60 kg/m      Assessment & Plan:  KAMERIN AXFORD comes in today with chief complaint of Follow-up   Diagnosis and orders addressed:  1. Hematoma of left lower leg (Primary) Keep elevated  - CMP14+EGFR - CBC with Differential/Platelet  2. Acute cystitis without hematuria Continue Bactrim   Force fluids - CMP14+EGFR - CBC with Differential/Platelet  3. Atrial  fibrillation, unspecified type (HCC) - CMP14+EGFR - CBC with Differential/Platelet  4. Iron deficiency anemia due to chronic  blood loss Start daily iron with stool softener  - CMP14+EGFR - CBC with Differential/Platelet - Iron, Ferrous Sulfate , 325 (65 Fe) MG TABS; Take 325 mg by mouth daily.  Dispense: 90 tablet; Refill: 1  5. Hyperkalemia Labs pending  Stop any oral K+ - CMP14+EGFR - CBC with Differential/Platelet  6. Chronic congestive heart failure, unspecified heart failure type (HCC) Will decrease lasix  to 20 mg from 40 mg and take every day Long discussion about taking an extra 20 mg if gain 3 lb in one day or 5 lb in a week Low salt diet  - furosemide  (LASIX ) 20 MG tablet; Take 1 tablet (20 mg total) by mouth daily.  Dispense: 30 tablet; Refill: 3   Labs pending Continue current medications  Keep follow up with specialists  Health Maintenance reviewed Diet and exercise encouraged  Return in about 1 month (around 10/18/2023), or if symptoms worsen or fail to improve.    Tommas Fragmin, FNP

## 2023-09-17 NOTE — Patient Instructions (Signed)
 Heart Failure: How to Manage  Take extra lasix  20 mg if you gain more than 3 lbs in one day or 5 lbs in one week.  Heart failure is a long-term condition where your heart can't pump enough blood through your body. When this happens, parts of your body don't get the blood and oxygen  they need. There's no cure for heart failure, so it's important to take good care of yourself and follow the treatment plan you set with your health care provider. If you're living with heart failure, there are ways to help you manage the disease. How to manage lifestyle changes Living with heart failure requires you to make changes in your daily life. Your health care team will teach you about the changes you need to make. These changes can help improve your symptoms and lower your risk of going to the hospital. Work with your provider to create a plan for you. Activity Ask your provider about cardiac rehabilitation programs. These programs include physical activity. If no cardiac rehab program is available, ask your provider what exercises are safe for you to do. Ask what things are safe for you to do at home. Ask when you can go back to work or school. Pace your daily activities and allow time for rest. Managing stress It's normal to have many emotions when you're told you have heart failure. You may have fear, sadness, and anger. If you need help coping with any of these emotions, let your provider know. Here are some ways to help yourself manage these emotions: Talk to your friends and family about your condition. They can give you support and guidance. Explain your symptoms to them. If comfortable, you can invite them to attend appointments with you. Join a support group for people with heart failure. Talking with others who have the same symptoms may give you new ways of coping with your disease and your emotions. Accept help from others. Do not be ashamed if you need help. Use stress management techniques. Try  things like meditation, breathing exercises, or listening to relaxing music. Conditions like depression and anxiety are common in people with heart failure. Pay attention to changes in your mood, emotions, and stress levels. Tell your provider if you have any of the following symptoms: Trouble sleeping or a change in your sleeping patterns. Feeling sad or depressed. Losing interest in activities you normally enjoy. Feeling irritable or crying for no reason. Often worrying about the future. Work If heart failure affects your ability to work, you may need to talk with your provider about making a plan for changes. This may include: Reducing your work hours. Finding tasks that require less effort. Planning rest periods during work hours. Travel Talk with your provider if you plan to travel. There may be times when your provider suggests that you don't travel or you wait until your condition has improved. Bring your medicine and a list of your medicines. If you're traveling by public transportation (airplane, train, bus), keep your medicines with you in a carry-on bag. If you have special needs, contact the transportation company prior to traveling. This may include needs related to diet, oxygen , a wheelchair, a seating request, or help with luggage. Think about finding a medical facility in the area you'll be traveling to. Find out what your health insurance will cover. If you use oxygen , make sure you bring enough oxygen  with you. If you have a battery-powered oxygen  device, bring a fully charged extra battery with you. If you  have an implanted device, bring a note from your provider and tell the security screening workers that you have the device. You may need to go through special screening. Sexual activity  Ask your provider when it's safe for you to resume sexual activity. You may need to start slowly and increase intimacy over time. Regular exercise can benefit your sex life by building  strength and endurance. Sleep If your condition affects your sleep, find ways to improve how well you sleep at night. These tips may help: Sleep lying on your side, or sleep with your head raised. Try: Raising the head of your bed. Using more than one pillow. Ask your provider about screening for sleep apnea. Try to go to sleep and wake up at the same times every day. Sleep in a dark, cool room. Do not exercise or eat for a few hours before bedtime. Plan rest periods during the day. But don't take long naps.  Where to find support In addition to talking with your family or friends, consider talking with: A mental health professional or therapist. A member of your church, faith, or community group. Other sources of support include: Local support groups. Ask your provider about groups near you. Online support groups, such as those found through: Heart Failure Society of America: hfsa.org American Heart Association: supportnetwork.heart.org Local community agencies or social agencies. A palliative care specialist. Palliative care can help manage symptoms, promote comfort, improve quality of life, and maintain dignity. Where to find more information To learn more, go to these websites: Centers for Disease Control and Prevention at TonerProm[employer].no. Then: Click Health Topics. Type "heart failure" in the search box. American Heart Association: heart.org National Heart, Lung, and Blood Institute: [employer]ry[employer]er.gl Contact a health care provider if: You have trouble breathing when you're active. You have swelling in your feet or legs. You gain 2-3 lb (0.9-1.4 kg) in 24 hours, or 5 lb (2.3 kg) in a week. You have a dry cough. You're not able to take part in your usual physical activities. You feel dizzy or light-headed when you stand up. You have trouble sleeping. You have a decrease in appetite. You have symptoms of depression or anxiety. Get help right away if: You have trouble breathing when  resting. You have trouble staying awake or you feel confused. You have chest pain. You faint. You have fast or irregular heartbeats. These symptoms may be an emergency. Call 911 right away. Do not wait to see if the symptoms will go away. Do not drive yourself to the hospital. This information is not intended to replace advice given to you by your health care provider. Make sure you discuss any questions you have with your health care provider. Document Revised: 11/20/2022 Document Reviewed: 11/20/2022 Elsevier Patient Education  2024 [employer].

## 2023-09-18 ENCOUNTER — Other Ambulatory Visit: Payer: Self-pay | Admitting: Family

## 2023-09-18 ENCOUNTER — Ambulatory Visit: Payer: Self-pay | Admitting: Family

## 2023-09-18 LAB — CBC WITH DIFFERENTIAL/PLATELET
Basophils Absolute: 0.1 10*3/uL (ref 0.0–0.2)
Basos: 0 %
EOS (ABSOLUTE): 0 10*3/uL (ref 0.0–0.4)
Eos: 0 %
Hematocrit: 37.4 % (ref 34.0–46.6)
Hemoglobin: 10.8 g/dL — ABNORMAL LOW (ref 11.1–15.9)
Immature Grans (Abs): 0 10*3/uL (ref 0.0–0.1)
Immature Granulocytes: 0 %
Lymphocytes Absolute: 2.1 10*3/uL (ref 0.7–3.1)
Lymphs: 16 %
MCH: 23.1 pg — ABNORMAL LOW (ref 26.6–33.0)
MCHC: 28.9 g/dL — ABNORMAL LOW (ref 31.5–35.7)
MCV: 80 fL (ref 79–97)
Monocytes Absolute: 1.1 10*3/uL — ABNORMAL HIGH (ref 0.1–0.9)
Monocytes: 9 %
Neutrophils Absolute: 10 10*3/uL — ABNORMAL HIGH (ref 1.4–7.0)
Neutrophils: 75 %
Platelets: 369 10*3/uL (ref 150–450)
RBC: 4.67 x10E6/uL (ref 3.77–5.28)
RDW: 17 % — ABNORMAL HIGH (ref 11.7–15.4)
WBC: 13.4 10*3/uL — ABNORMAL HIGH (ref 3.4–10.8)

## 2023-09-18 LAB — CMP14+EGFR
ALT: 19 IU/L (ref 0–32)
AST: 22 IU/L (ref 0–40)
Albumin: 4.6 g/dL (ref 3.7–4.7)
Alkaline Phosphatase: 66 IU/L (ref 44–121)
BUN/Creatinine Ratio: 17 (ref 12–28)
BUN: 27 mg/dL (ref 8–27)
Bilirubin Total: 0.3 mg/dL (ref 0.0–1.2)
CO2: 20 mmol/L (ref 20–29)
Calcium: 10.7 mg/dL — ABNORMAL HIGH (ref 8.7–10.3)
Chloride: 98 mmol/L (ref 96–106)
Creatinine, Ser: 1.57 mg/dL — ABNORMAL HIGH (ref 0.57–1.00)
Globulin, Total: 3 g/dL (ref 1.5–4.5)
Glucose: 136 mg/dL — ABNORMAL HIGH (ref 70–99)
Potassium: 5.6 mmol/L — ABNORMAL HIGH (ref 3.5–5.2)
Sodium: 139 mmol/L (ref 134–144)
Total Protein: 7.6 g/dL (ref 6.0–8.5)
eGFR: 33 mL/min/{1.73_m2} — ABNORMAL LOW (ref >59)

## 2023-09-18 MED ORDER — AMLODIPINE BESYLATE 5 MG PO TABS: 5.0000 mg | ORAL_TABLET | Freq: Every day | ORAL | 2 refills | Status: DC

## 2023-09-18 NOTE — Telephone Encounter (Signed)
 Copied from CRM [id number]. Topic: Clinical - Lab/Test Results >> Sep 18, 2023  2:13 PM Donald Frost wrote: Reason for CRM: Felecia Hopper the patients daughter called in for the lab results and I relayed what the provider said. She said thank you and she will make sure the patient finishes her antibiotic and takes the Norvasc . She said they will be there for her follow up appt on June 30th.

## 2023-09-22 ENCOUNTER — Ambulatory Visit (INDEPENDENT_AMBULATORY_CARE_PROVIDER_SITE_OTHER): Payer: Medicare Other

## 2023-09-22 DIAGNOSIS — I442 Atrioventricular block, complete: Secondary | ICD-10-CM

## 2023-09-22 LAB — CUP PACEART REMOTE DEVICE CHECK
Battery Remaining Longevity: 7 mo
Battery Voltage: 2.75 V
Brady Statistic AP VP Percent: 1.88 %
Brady Statistic AP VS Percent: 98.11 %
Brady Statistic AS VP Percent: 0 %
Brady Statistic AS VS Percent: 0.01 %
Brady Statistic RA Percent Paced: 99.99 %
Brady Statistic RV Percent Paced: 1.88 %
Date Time Interrogation Session: 20250602223031
Implantable Lead Connection Status: 753985
Implantable Lead Connection Status: 753985
Implantable Lead Implant Date: 20190128
Implantable Lead Implant Date: 20190128
Implantable Lead Location: 753860
Implantable Lead Location: 753860
Implantable Lead Model: 3830
Implantable Lead Model: 5076
Implantable Pulse Generator Implant Date: 20190128
Lead Channel Impedance Value: 323 Ohm
Lead Channel Impedance Value: 323 Ohm
Lead Channel Impedance Value: 380 Ohm
Lead Channel Impedance Value: 399 Ohm
Lead Channel Sensing Intrinsic Amplitude: 0.875 mV
Lead Channel Sensing Intrinsic Amplitude: 0.875 mV
Lead Channel Sensing Intrinsic Amplitude: 8.5 mV
Lead Channel Sensing Intrinsic Amplitude: 8.5 mV
Lead Channel Setting Pacing Amplitude: 2.5 V
Lead Channel Setting Pacing Amplitude: 2.5 V
Lead Channel Setting Pacing Pulse Width: 0.4 ms
Lead Channel Setting Sensing Sensitivity: 0.9 mV
Zone Setting Status: 755011
Zone Setting Status: 755011

## 2023-09-24 ENCOUNTER — Ambulatory Visit: Payer: Self-pay | Admitting: Internal Medicine

## 2023-09-26 DIAGNOSIS — J449 Chronic obstructive pulmonary disease, unspecified: Secondary | ICD-10-CM | POA: Diagnosis not present

## 2023-09-26 DIAGNOSIS — J8410 Pulmonary fibrosis, unspecified: Secondary | ICD-10-CM | POA: Diagnosis not present

## 2023-09-26 DIAGNOSIS — I509 Heart failure, unspecified: Secondary | ICD-10-CM | POA: Diagnosis not present

## 2023-10-01 ENCOUNTER — Ambulatory Visit: Payer: Self-pay

## 2023-10-01 NOTE — Telephone Encounter (Signed)
 Appt made

## 2023-10-01 NOTE — Telephone Encounter (Signed)
 FYI Only or Action Required?: Action required by provider  Patient was last seen in primary care on 09/17/2023 by Yevette Hem, FNP. Called Nurse Triage reporting Urinary Frequency and Urinary Tract Infection. Symptoms began several days ago. Interventions attempted: OTC medications: Azo. Symptoms are: unchanged.  Triage Disposition: See Physician Within 24 Hours  Patient/caregiver understands and will follow disposition?: Yes                 Reason for Disposition  Urinating more frequently than usual (i.e., frequency)  Answer Assessment - Initial Assessment Questions 1. SYMPTOM: What's the main symptom you're concerned about? (e.g., frequency, incontinence)     Dysuria, frequency 2. ONSET: When did the symptoms start?     2 days 3. PAIN: Is there any pain? If Yes, ask: How bad is it? (Scale: 1-10; mild, moderate, severe)     10/10 when it hurts the worst; feels like someone is sticking a knife in it 4. CAUSE: What do you think is causing the symptoms?     UTI  5. OTHER SYMPTOMS: Do you have any other symptoms? (e.g., blood in urine, fever, flank pain, pain with urination)      Incontinence, frequency, dysuria   Denies fevers or chills. Denies N/V  Protocols used: Urinary Symptoms-A-AH

## 2023-10-01 NOTE — Telephone Encounter (Signed)
 Patient called, left VM to return the call to the office to speak to NT.    Copied from CRM [id number]. Topic: Clinical - Medical Advice >> Oct 01, 2023 12:15 PM Loreda Rodriguez T wrote: Reason for CRM: patients daughter called stated mom is hurting really bad and she thinks she has a UTI. Daughter says she is having frequent bathroom visits. Please f/u with patient at [phone] for medication for symptoms   ----------------------------------------------------------------------- From previous Reason for Contact - Scheduling: Patient/patient representative is calling to schedule an appointment. Refer to attachments for appointment information.

## 2023-10-02 ENCOUNTER — Ambulatory Visit: Payer: Self-pay | Admitting: Family Medicine

## 2023-10-02 ENCOUNTER — Ambulatory Visit (INDEPENDENT_AMBULATORY_CARE_PROVIDER_SITE_OTHER): Admitting: Family Medicine

## 2023-10-02 ENCOUNTER — Encounter: Payer: Self-pay | Admitting: Family Medicine

## 2023-10-02 VITALS — BP 148/74 | HR 83 | Temp 97.8°F

## 2023-10-02 DIAGNOSIS — N3001 Acute cystitis with hematuria: Secondary | ICD-10-CM | POA: Diagnosis not present

## 2023-10-02 DIAGNOSIS — S8012XA Contusion of left lower leg, initial encounter: Secondary | ICD-10-CM

## 2023-10-02 DIAGNOSIS — R300 Dysuria: Secondary | ICD-10-CM

## 2023-10-02 DIAGNOSIS — N289 Disorder of kidney and ureter, unspecified: Secondary | ICD-10-CM | POA: Diagnosis not present

## 2023-10-02 DIAGNOSIS — E875 Hyperkalemia: Secondary | ICD-10-CM | POA: Diagnosis not present

## 2023-10-02 DIAGNOSIS — N390 Urinary tract infection, site not specified: Secondary | ICD-10-CM

## 2023-10-02 LAB — URINALYSIS, ROUTINE W REFLEX MICROSCOPIC
Bilirubin, UA: NEGATIVE
Glucose, UA: NEGATIVE
Ketones, UA: NEGATIVE
Nitrite, UA: NEGATIVE
Specific Gravity, UA: 1.015 (ref 1.005–1.030)
Urobilinogen, Ur: 0.2 mg/dL (ref 0.2–1.0)
pH, UA: 5.5 (ref 5.0–7.5)

## 2023-10-02 LAB — MICROSCOPIC EXAMINATION: WBC, UA: >30 /HPF — AB (ref 0–5)

## 2023-10-02 MED ORDER — CEPHALEXIN 500 MG PO CAPS: 500.0000 mg | ORAL_CAPSULE | Freq: Two times a day (BID) | ORAL | 0 refills | Status: AC

## 2023-10-02 NOTE — Progress Notes (Signed)
 Subjective:  Patient ID: Lorraine Leonard, female    DOB: 11/06/1940, 83 y.o.   MRN: [id number]  Patient Care Team: Yevette Hem, FNP as PCP - General (Family Medicine) Tammie Fall, MD as PCP - Electrophysiology (Cardiology) Amanda Jungling Joyceann No, MD as PCP - Cardiology (Cardiology) Diamond Formica, MD as Consulting Physician (Pulmonary Disease) Trent Frizzle, MD as Consulting Physician (Urology) Alexia Idler, [place] (Optometry)   Chief Complaint:  Dysuria (X 2 days)   HPI: Lorraine Leonard is a 82 y.o. female presenting on 10/02/2023 for Dysuria (X 2 days)   Lorraine Leonard is an 83 year old female with recurrent urinary tract infections who presents with urinary symptoms.  She experiences burning during urination, which began at the start of the week and occurs with every urination. She has not increased her water intake but has been drinking cranberry juice and taking cranberry pills. She uses a Development worker, international aid in the shower for hygiene. After completing a course of Bactrim , her symptoms resolved temporarily.  She has a history of recurrent urinary tract infections and has previously seen a urologist every three to four months. She is dissatisfied with the care received, noting a delay in receiving antibiotics when needed. She lives alone and relies on her granddaughter for transportation, complicating her ability to attend appointments.  No fever, chills, or increased confusion beyond her baseline. She reports having a dry mouth.  She mentions a bruise on her leg that was initially large and painful, and it still burns and stings occasionally.         Relevant past medical, surgical, family, and social history reviewed and updated as indicated.  Allergies and medications reviewed and updated. Data reviewed: Chart in Epic.   Past Medical History:  Diagnosis Date   Allergic rhinitis    Anxiety    Anxiety    Arthritis    ~ all my joints (05/18/2017)   Atrial fibrillation (HCC)  09/2016   Atrial fibrillation with RVR (HCC) 05/18/2017   Chronic heart failure with preserved ejection fraction (HFpEF) (HCC)    Chronic kidney disease, stage 3a (HCC)    Chronic lower back pain    COPD (chronic obstructive pulmonary disease) (HCC)    Depression    Diverticulosis    GERD (gastroesophageal reflux disease)    Glaucoma    Gout    History of blood transfusion 1983   when I gave a 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   Hyperlipidemia    Hypertension    Hypothyroidism    Macular degeneration of both eyes    Neuropathy    On home oxygen  therapy    3L; 24/7 (05/18/2017)   Pre-diabetes    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   Pulmonary fibrosis (HCC)    Rheumatoid arthritis (HCC)    Sleep apnea    uses 3 liters of o2 24/7   Urticaria     Past Surgical History:  Procedure Laterality Date   AV NODE ABLATION  05/18/2017   AV NODE ABLATION N/A 05/18/2017   Procedure: AV NODE ABLATION;  Surgeon: Tammie Fall, MD;  Location: [facility];  Service: Cardiovascular;  Laterality: N/A;   BACK SURGERY     BREAST LUMPECTOMY Left    CARDIOVERSION N/A 01/30/2017   Procedure: CARDIOVERSION;  Surgeon: Loyde Rule, MD;  Location: [facility];  Service: Cardiovascular;  Laterality: N/A;   CARDIOVERSION N/A 04/09/2017   Procedure: CARDIOVERSION;  Surgeon: Teddie Favre  G, MD;  Location: [facility];  Service: Cardiovascular;  Laterality: N/A;   CATARACT EXTRACTION W/ INTRAOCULAR LENS  IMPLANT, BILATERAL Bilateral    DILATION AND CURETTAGE OF UTERUS     EUS N/A 10/03/2015   Procedure: UPPER ENDOSCOPIC ULTRASOUND (EUS) RADIAL;  Surgeon: Evangeline Hilts, MD;  Location: [facility];  Service: Endoscopy;  Laterality: N/A;   FINE NEEDLE ASPIRATION N/A 10/03/2015   Procedure: FINE NEEDLE ASPIRATION (FNA) RADIAL;  Surgeon: Evangeline Hilts, MD;  Location: [facility];  Service: Endoscopy;  Laterality: N/A;   INSERT / REPLACE / REMOVE PACEMAKER  05/18/2017   LUMBAR  LAMINECTOMY  1995; 06/23/2008   L4-5/notes 08/20/2010   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   NEPHRECTOMY TRANSPLANTED ORGAN     PACEMAKER IMPLANT N/A 05/18/2017   Procedure: PACEMAKER IMPLANT;  Surgeon: Tammie Fall, MD;  Location: [facility];  Service: Cardiovascular;  Laterality: N/A;   TOTAL ABDOMINAL HYSTERECTOMY     TUBAL LIGATION      Social History   Socioeconomic History   Marital status: Widowed    Spouse name: Nayra Coury   Number of children: 4   Years of education: 9   Highest education level: 9th grade  Occupational History   Occupation: retired    Comment: cna  Tobacco Use   Smoking status: Never    Passive exposure: Never   Smokeless tobacco: Never  Vaping Use   Vaping status: Never Used  Substance and Sexual Activity   Alcohol  use: No   Drug use: No   Sexual activity: Not Currently    Partners: Male    Birth control/protection: Surgical  Other Topics Concern   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   Pt lives alone in single wide trailer. They have 4 children and 13 grandchildren.    Social Drivers of Corporate investment banker Strain: Low Risk  (03/28/2022)   Overall Financial Resource Strain (CARDIA)    Difficulty of Paying Living Expenses: Not hard at all  Food Insecurity: No Food Insecurity (03/28/2022)   Hunger Vital Sign    Worried About Running Out of Food in the Last Year: Never true    Ran Out of Food in the Last Year: Never true  Transportation Needs: No Transportation Needs (03/28/2022)   PRAPARE - Administrator, Civil Service (Medical): No    Lack of Transportation (Non-Medical): No  Physical Activity: Insufficiently Active (03/28/2022)   Exercise Vital Sign    Days of Exercise per Week: 3 days    Minutes of Exercise per Session: 30 min  Stress: No Stress Concern Present (03/28/2022)   [employer] of Occupational  Health - Occupational Stress Questionnaire    Feeling of Stress : Not at all  Social Connections: Moderately Isolated (03/28/2022)   Social Connection and Isolation Panel    Frequency of Communication with Friends and Family: More than three times a week    Frequency of Social Gatherings with Friends and Family: More than three times a week    Attends Religious Services: 1 to 4 times per year    Active Member of [employer] or Organizations: No    Attends Banker Meetings: Never    Marital Status: Widowed  Intimate Partner Violence: Not At Risk (03/28/2022)   Humiliation, Afraid, Rape, and Kick questionnaire    Fear  of Current or Ex-Partner: No    Emotionally Abused: No    Physically Abused: No    Sexually Abused: No    Outpatient Encounter Medications as of 10/02/2023  Medication Sig   albuterol  (VENTOLIN  HFA) 108 (90 Base) MCG/ACT inhaler INHALE 2 PUFFS INTO THE LUNGS EVERY 6 HOURS AS NEEDED FOR WHEEZE OR SHORTNESS OF BREATH   amLODipine  (NORVASC ) 5 MG tablet Take 1 tablet (5 mg total) by mouth daily.   apixaban  (ELIQUIS ) 5 MG TABS tablet TAKE 1 TABLET BY MOUTH TWICE A DAY   atenolol  (TENORMIN ) 25 MG tablet TAKE 1 TABLET (25 MG TOTAL) BY MOUTH DAILY.   calcium  citrate (CALCITRATE - DOSED IN MG ELEMENTAL CALCIUM ) 950 MG tablet Take 1 tablet by mouth 2 (two) times daily.    cephALEXin  (KEFLEX ) 500 MG capsule Take 1 capsule (500 mg total) by mouth 2 (two) times daily for 7 days.   cyanocobalamin  1000 MCG tablet Take 1,000 mcg by mouth daily. Vitamin b12 every AM   diclofenac  Sodium (VOLTAREN ) 1 % GEL APPLY 4 GRAMS TOPICALLY 4 TIMES A DAY   estradiol  (ESTRACE  VAGINAL) 0.1 MG/GM vaginal cream Place 1 Applicatorful vaginally at bedtime.   estradiol  (ESTRACE ) 0.1 MG/GM vaginal cream Place 1 g vaginally daily.   fluticasone  (FLONASE ) 50 MCG/ACT nasal spray PLACE 1 SPRAY INTO BOTH NOSTRILS 2 (TWO) TIMES DAILY AS NEEDED FOR ALLERGIES OR RHINITIS.   furosemide  (LASIX ) 20 MG tablet Take 1  tablet (20 mg total) by mouth daily.   Iron , Ferrous Sulfate , 325 (65 Fe) MG TABS Take 325 mg by mouth daily.   levocetirizine (XYZAL ) 5 MG tablet TAKE 1 TABLET BY MOUTH EVERY DAY IN THE EVENING   levothyroxine  (SYNTHROID ) 100 MCG tablet TAKE 1 TABLET BY MOUTH EVERY DAY   metFORMIN  (GLUCOPHAGE -XR) 500 MG 24 hr tablet Take 1 tablet (500 mg total) by mouth daily with breakfast.   Multiple Vitamins-Minerals (PRESERVISION AREDS 2) CAPS Take 1 capsule by mouth 2 (two) times daily.   Olopatadine  HCl 0.2 % SOLN INSTILL 1 DROP INTO BOTH EYES EVERY DAY   omeprazole  (PRILOSEC) 20 MG capsule Take 1 capsule (20 mg total) by mouth daily.   ondansetron  (ZOFRAN ) 4 MG tablet Take 1 tablet (4 mg total) by mouth every 8 (eight) hours as needed for nausea or vomiting.   OXYGEN  Inhale 3 L into the lungs every evening.   Polyethyl Glycol-Propyl Glycol (SYSTANE) 0.4-0.3 % GEL ophthalmic gel Place 1 application into both eyes at bedtime as needed (dry eyes).   predniSONE  (DELTASONE ) 5 MG tablet TAKE 1 TABLET (5 MG TOTAL) BY MOUTH 2 (TWO) TIMES DAILY WITH A MEAL.   rosuvastatin  (CRESTOR ) 10 MG tablet TAKE 1 TABLET BY MOUTH EVERYDAY AT BEDTIME   vitamin C  (ASCORBIC ACID ) 500 MG tablet Take 1,000 mg by mouth daily. AM   Vitamin D , Ergocalciferol , (DRISDOL ) 1.25 MG (50000 UNIT) CAPS capsule TAKE 1 CAPSULE (50,000 UNITS TOTAL) BY MOUTH EVERY 7 (SEVEN) DAYS   No facility-administered encounter medications on file as of 10/02/2023.    Allergies  Allergen Reactions   Allopurinol  Swelling    Feet and legs swell   Mobic  [Meloxicam ] Swelling and Other (See Comments)    Feet and Leg swelling   Macrodantin  [Nitrofurantoin  Macrocrystal]     Pertinent ROS per HPI, otherwise unremarkable      Objective:  BP (!) 148/74   Pulse 83   Temp 97.8 F (36.6 C)   SpO2 96%    Wt Readings from Last  3 Encounters:  09/10/23 189 lb (85.7 kg)  07/21/23 189 lb (85.7 kg)  06/29/23 190 lb (86.2 kg)    Physical Exam Vitals and  nursing note reviewed.  Constitutional:      Appearance: She is ill-appearing (frail elderly).  HENT:     Head: Normocephalic and atraumatic.     Nose: Nose normal.     Mouth/Throat:     Mouth: Mucous membranes are moist.   Eyes:     Conjunctiva/sclera: Conjunctivae normal.     Pupils: Pupils are equal, round, and reactive to light.    Cardiovascular:     Rate and Rhythm: Normal rate. Rhythm irregularly irregular.     Heart sounds: Normal heart sounds.  Pulmonary:     Effort: Pulmonary effort is normal.     Breath sounds: Normal breath sounds.  Abdominal:     Palpations: Abdomen is soft.     Tenderness: There is no abdominal tenderness. There is no right CVA tenderness or left CVA tenderness.   Skin:    General: Skin is warm and dry.     Capillary Refill: Capillary refill takes less than 2 seconds.       Neurological:     General: No focal deficit present.     Mental Status: She is alert and oriented to person, place, and time.     Motor: Weakness (in wheelchair) present.   Psychiatric:        Mood and Affect: Mood normal.        Behavior: Behavior normal.        Thought Content: Thought content normal.        Judgment: Judgment normal.      Results for orders placed or performed in visit on 09/22/23  CUP PACEART REMOTE DEVICE CHECK   Collection Time: 09/21/23 10:30 PM  Result Value Ref Range   Date Time Interrogation Session [id number]    Pulse Generator Manufacturer MERM    Pulse Gen Model W1DR01 Azure XT DR MRI    Pulse Gen Serial Number [id number]    Clinic Name [facility]    Implantable Pulse Generator Type Implantable Pulse Generator    Implantable Pulse Generator Implant Date 78295621    Implantable Lead Manufacturer [facility]    Implantable Lead Model 3830 SelectSecure    Implantable Lead Serial Number [id number]    Implantable Lead Implant Date 30865784    Implantable Lead Location Detail 1 UNKNOWN    Implantable Lead Special Function BUNDLE OF  HIS    Implantable Lead Location [id number]    Implantable Lead Connection Status [id number]    Implantable Lead Manufacturer MERM    Implantable Lead Model 5076 CapSureFix Novus MRI SureScan    Implantable Lead Serial Number [id number]    Implantable Lead Implant Date 69629528    Implantable Lead Location Detail 1 APEX    Implantable Lead Location [id number]    Implantable Lead Connection Status [id number]    Lead Channel Setting Sensing Sensitivity 0.9 mV   Lead Channel Setting Pacing Amplitude 2.5 V   Lead Channel Setting Pacing Pulse Width 0.4 ms   Lead Channel Setting Pacing Amplitude 2.5 V   Zone Setting Status [id number]    Zone Setting Status [phone]    Lead Channel Impedance Value 399 ohm   Lead Channel Impedance Value 323 ohm   Lead Channel Sensing Intrinsic Amplitude 0.875 mV   Lead Channel Sensing Intrinsic Amplitude 0.875 mV   Lead Channel Impedance Value 380 ohm   Lead Channel  Impedance Value 323 ohm   Lead Channel Sensing Intrinsic Amplitude 8.5 mV   Lead Channel Sensing Intrinsic Amplitude 8.5 mV   Battery Status OK    Battery Remaining Longevity 7 mo   Battery Voltage 2.75 V   Brady Statistic RA Percent Paced 99.99 %   Brady Statistic RV Percent Paced 1.88 %   Brady Statistic AP VP Percent 1.88 %   Brady Statistic AS VP Percent 0 %   Brady Statistic AP VS Percent 98.11 %   Brady Statistic AS VS Percent 0.01 %   *Note: Due to a large number of results and/or encounters for the requested time period, some results have not been displayed. A complete set of results can be found in Results Review.       Pertinent labs & imaging results that were available during my care of the patient were reviewed by me and considered in my medical decision making.  Assessment & Plan:  Lorraine Leonard was seen today for dysuria.  Diagnoses and all orders for this visit:  Dysuria -     Urinalysis, Routine w reflex microscopic -     Urine Culture -     Ambulatory referral to Urology  Acute cystitis with  hematuria -     Urine Culture -     cephALEXin  (KEFLEX ) 500 MG capsule; Take 1 capsule (500 mg total) by mouth 2 (two) times daily for 7 days. -     Ambulatory referral to Urology  Recurrent UTI -     Urine Culture -     cephALEXin  (KEFLEX ) 500 MG capsule; Take 1 capsule (500 mg total) by mouth 2 (two) times daily for 7 days. -     Ambulatory referral to Urology  Hyperkalemia -     BMP8+EGFR  Hematoma of left lower leg -     BMP8+EGFR  Renal function impairment -     BMP8+EGFR       Urinary Tract Infection (UTI) Recurrent UTI with dysuria beginning at the start of the week. Previous Bactrim  treatment resolved symptoms temporarily. Current urinalysis suggests early infection, less severe than prior episodes. She has recurrent UTIs and uses cranberry products for prevention. Referral to a different urologist is planned for evaluation of prophylactic treatment. - Perform urinalysis and urine culture to confirm infection and guide antibiotic therapy - Initiate antibiotic therapy for current UTI - Refer to a different urologist for evaluation of recurrent UTIs and consideration of prophylactic treatment  Chronic Kidney Disease (CKD) Elevated potassium and significant decline in renal function in previous labs. Creatinine levels have increased. She has a solitary kidney, contributing to renal function decline. Lab work will be reassessed to ensure safety of prophylactic UTI treatment. - Order lab work to reassess renal function and potassium levels  Follow-up Follow-up plans discussed for continuity of care and management of conditions. Referral to a different urologist is planned for recurrent UTIs. - Schedule follow-up appointment with a different urologist in [place], [place], or [place] - Ensure she completes lab work for blood tests          Continue all other maintenance medications.  Follow up plan: Return if symptoms worsen or fail to improve.   Continue healthy  lifestyle choices, including diet (rich in fruits, vegetables, and lean proteins, and low in salt and simple carbohydrates) and exercise (at least 30 minutes of moderate physical activity daily).   The above assessment and management plan was discussed with the patient. The patient verbalized understanding of and  has agreed to the management plan. Patient is aware to call the clinic if they develop any new symptoms or if symptoms persist or worsen. Patient is aware when to return to the clinic for a follow-up visit. Patient educated on when it is appropriate to go to the emergency department.   Lorraine Parrot, FNP-C [facility] [phone]

## 2023-10-03 LAB — BMP8+EGFR
BUN/Creatinine Ratio: 20 (ref 12–28)
BUN: 22 mg/dL (ref 8–27)
CO2: 18 mmol/L — ABNORMAL LOW (ref 20–29)
Calcium: 10.1 mg/dL (ref 8.7–10.3)
Chloride: 102 mmol/L (ref 96–106)
Creatinine, Ser: 1.09 mg/dL — ABNORMAL HIGH (ref 0.57–1.00)
Glucose: 174 mg/dL — ABNORMAL HIGH (ref 70–99)
Potassium: 4.6 mmol/L (ref 3.5–5.2)
Sodium: 140 mmol/L (ref 134–144)
eGFR: 50 mL/min/{1.73_m2} — ABNORMAL LOW (ref >59)

## 2023-10-06 ENCOUNTER — Encounter: Payer: Self-pay | Admitting: Urology

## 2023-10-06 ENCOUNTER — Ambulatory Visit (INDEPENDENT_AMBULATORY_CARE_PROVIDER_SITE_OTHER): Admitting: Urology

## 2023-10-06 VITALS — BP 126/69 | HR 85

## 2023-10-06 DIAGNOSIS — N952 Postmenopausal atrophic vaginitis: Secondary | ICD-10-CM | POA: Diagnosis not present

## 2023-10-06 DIAGNOSIS — R262 Difficulty in walking, not elsewhere classified: Secondary | ICD-10-CM | POA: Insufficient documentation

## 2023-10-06 DIAGNOSIS — D84821 Immunodeficiency due to drugs: Secondary | ICD-10-CM

## 2023-10-06 DIAGNOSIS — Z79899 Other long term (current) drug therapy: Secondary | ICD-10-CM

## 2023-10-06 DIAGNOSIS — Z905 Acquired absence of kidney: Secondary | ICD-10-CM

## 2023-10-06 DIAGNOSIS — N390 Urinary tract infection, site not specified: Secondary | ICD-10-CM | POA: Insufficient documentation

## 2023-10-06 DIAGNOSIS — Z87440 Personal history of urinary (tract) infections: Secondary | ICD-10-CM | POA: Diagnosis not present

## 2023-10-06 DIAGNOSIS — Z748 Other problems related to care provider dependency: Secondary | ICD-10-CM | POA: Insufficient documentation

## 2023-10-06 LAB — URINE CULTURE

## 2023-10-06 LAB — BLADDER SCAN AMB NON-IMAGING: Scan Result: 25

## 2023-10-06 MED ORDER — CEPHALEXIN 250 MG PO CAPS
250.0000 mg | ORAL_CAPSULE | Freq: Every day | ORAL | 5 refills | Status: DC
Start: 2023-10-06 — End: 2023-11-26

## 2023-10-06 NOTE — Patient Instructions (Addendum)
 PLAN Advised the following: 1. Start Keflex  (Cephalexin ) 250 mg daily for UTI prophylaxis 2. Continue topical vaginal estrogen cream at least 2-3 nights per week routinely. 3. Maintain adequate fluid intake daily to flush out the urinary tract. 4. Urinate every 4-6 hours while awake to minimize urinary stasis / bacterial overgrowth in the bladder. 5. Consider OTC supplements for UTI prevention. 6. Return in about 6 weeks (around 11/17/2023) for Recurrent UTI, with UA & PVR.     UTI prevention / management:  UTI symptoms may include:  - Pain / burning / discomfort when urinating - Recent increase in urinary urgency (how quickly you feel like you need to rush to the bathroom) - Recent increase in urinary frequency (how often you are urinating) - Fever - Acute mental status change / confusion - Fatigue / Feeling tired - Weakness - Note: Urine color, clarity, and odor are not considered to be clinically significant indicators of UTI and do not warrant urine testing unless patient is also experiencing UTI symptoms such as those listed above.  Difference between Urinalysis (urine dipstick test) and Urine culture / Why urine culture often needed to determine appropriate diagnosis and treatment of urologic symptoms: > Urinalysis (urine dipstick test): A quick office test used as an indicator to determine whether or not further testing is necessary (such as a urine culture, urine microscopy, etc.) The urinalysis cannot differentiate a true bacterial UTI or give a definitive diagnosis for the findings.  > Urine culture: May be performed based on the findings of a urinalysis to evaluate for UTI. Grows out on a petri dish for 48-72 hours. Provides important information about: whether or not bacterial growth is present and if so: what the predominant bacteria is which antibiotics will work best against that bacteria That information is important so that we can diagnose and treat patients  appropriately as there are other conditions which may mimic UTls which must not be missed (such as cancer, interstitial cystitis, stones, etc.). Assists us  with antibiotic stewardship to minimize patient's risk for developing antibiotic resistance (getting to a point where no antibiotics work anymore).  Options when UTI symptoms occur: 1. Call [facility] [place] and request to speak with triage nurse (phone # [phone], select option 3). In accordance with clinic guidelines the nurse will determine next steps based on patient-reported symptoms, which may include: same-day lab visit to provide urine specimen, recommendation to schedule Urology office visit appointment for further evaluation, recommendation to proceed to ER, etc. 2. Call your Primary Care Provider (PCP) office to request urgent / same-day visit. Be sure to request for urine culture to be ordered and have results faxed to Urology (fax # [phone]).  3. Go to urgent care. Be sure to request for urine culture to be ordered and have results faxed to Urology (fax # [phone]).   For bladder pain/ burning with urination: - Can take over-the-counter Pyridium  (phenazopyridine ; commonly known under the AZO brand) for a few days as needed. Limit use to no more than 3 days consecutively due to risk for methemoglobinemia, liver function issues, and bone health damage with long term use of Pyridium . - Alternative: Prescription urinary analgesics (such as Uribel, Urogesic blue, Urelle, Uro-MP). Often expensive / poorly covered by insurance unfortunately.  Options / recommendations for UTI prevention: - Low dose antibiotic daily for UTI prophylaxis. - Topical vaginal estrogen for vaginal atrophy (aka Genitourinary Syndrome of Menopause (GSM)). - Adequate daily fluid intake to flush out the urinary tract. - Go  to the bathroom to urinate every 4-6 hours while awake to minimize urinary stasis / bacterial overgrowth in the  bladder. - Proanthocyanidin (PAC) supplement 36 mg daily; must be soluble (insoluble form of PAC will be ineffective). Recommended brand: Ellura. This is an over-the-counter supplement (often must be found/ purchased online) supplement derived from cranberries with concentrated active component: Proanthocyanidin (PAC) 36 mg daily. Decreases bacterial adherence to bladder lining.  - D-mannose powder (2 grams daily). This is an over-the-counter supplement which decreases bacterial adherence to bladder lining (it is a sugar that inhibits bacterial adherence to urothelial cells by binding to the pili of enteric bacteria). Take as per manufacturer recommendation. Can be used as an alternative or in addition to the concentrated cranberry supplement.  - Vitamin C  supplement to acidify urine to minimize bacterial growth.  - Probiotic to maintain healthy vaginal microbiome to suppress bacteria at urethral opening. Brand recommendations: Estill Hemming (includes probiotic & D-mannose ), Feminine Balance (highest concentration of lactobacillus) or Hyperbiotic Pro 15.  Note for patients with diabetes:  - Be aware that D-mannose contains sugar.  Note for patients with interstitial cystitis (IC):  - Patients with IC should typically avoid cranberry/ PAC supplements and Vitamin C  supplements due to their acidity, which may exacerbate IC-related bladder pain. - Symptoms of true bacterial UTI can overlap / mimic symptoms of an IC flare up. Antibiotic use is NOT indicated for IC flare ups. Urine culture needed prior to antibiotic treatment for IC patients. The goal is to minimize your risk for developing antibiotic-resistant bacteria.    Vaginal atrophy I Genitourinary syndrome of menopause (GSM):  What it is: Changes in the vaginal environment (including the vulva and urethra) including: Thinning of the epithelium (skin/ mucosa surface) Can contribute to urinary urgency and frequency Can contribute to dryness, itching,  irritation of the vulvar and vaginal tissue Can contribute to pain with intercourse Can contribute to physical changes of the labia, vulva, and vagina such as: Narrowing of the vaginal opening Decreased vaginal length Loss of labial architecture Labial adhesions Pale color of vulvovaginal tissue Loss of pubic hair Allows bacteria to become adherent Results in increased risk for urinary tract infection (UTI) due to bacterial overgrowth and migration up the urethra into the bladder Change in vaginal pH (acid/ base balance) Allows for alteration / disruption of the normal bacterial flora / microbiome Results in increased risk for urinary tract infection (UTI) due to bacterial overgrowth  Treatment options: Over-the-counter lubricants (see list below). Prescription vaginal estrogen replacement. Options: Topical vaginal estrogen (estradiol ) cream: (Estrace , Premarin , compounded) Apply as directed Estring  vaginal ring Exchanged every 3 months (either at home or in office by provider) Vagifem  vaginal tablet Inserted nightly for 2 weeks then twice a week (long term) lntrarosa vaginal suppository Vaginal DHEA: converts to estrogen in vaginal tissue without systemic effect Inserted nightly (long term) 3. Vaginal laser therapy (Mona Edwina Gram touch) Performed in 3 treatments each 6 weeks apart (available in our [place] office). Can feel like a sunburn for 3-4 days after each treatment until new skin heals in. Usually not covered by insurance. Estimated cost is $1500 for all 3 sessions.  FYI regarding prescription vaginal estrogen treatment options: - All topical vaginal estrogen replacement options are equivalent in terms of efficacy. - Topical vaginal estrogen replacement will take about 3 months to be effective. - OK to have sex with any of the topical vaginal estrogen replacement options. - Topical vaginal estrogen replacement may sting/burn initially due to severe dryness, which will  improve  with ongoing treatment. - Studies have demonstrated negligible systemic absorption of low-dose intravaginal estrogen cream therefore the risk for cancer development or recurrence with this medication is minimal.  Genitourinary Syndrome of Menopause: AUA/SUFU/AUGS Guideline (2025) [email or web address]  Topical vaginal estrogen cream safe to use with breast cancer history [email or web address]  Topical vaginal estrogen cream safe to use with blood clot history [email or web address]   Lubricants and Moisturizers for Treating Genitourinary Syndrome of Menopause and Vulvovaginal Atrophy Treatment Comments I Available Products   Lubricants   Water-based Ingredients: Deionized water, glycerin, propylene glycol; latex safe; rare irritation; dry out with extended sexual activity Astroglide, Good Clean Love, K-Y Jelly, Natural, Organic, Pink, Sliquid, Sylk, Yes    Oil Based Ingredients: avocado, olive, peanut, corn; latex safe; can be used with silicone products; staining; safe (unless peanut allergy); non-irritating Coconut oil, vegetable oil, vitamin E  oil  Silicone-Based Ingredients: Silicone polymers; staining; typically nonirritating, long lasting; waterproof; should not be used with silicone dilators, sexual toys, or gynecologic products Astroglide X, Oceanus Ultra Pure, Pink Silicone, Pjur Eros, Replens Silky Smooth, Silicone Premium JO, SKYN, Uberlube, [employer] Based Minimize harm to sperm motility; designed for couples trying to conceive:  Astroglide TTC, Conceive Plus, PreSeed, Yes Baby  Fertility Friendly Minimize harm to sperm motility; designed for couples trying  to conceive:  Astroglide, TTC, Conceive Plus, PreSeed, Yes Baby  Vaginal Moisturizers   Vaginal Moisturizers For maintenance use 1 to 3 times weekly; can benefit women with dryness, chafing with AOL, and recurrent vaginal infections irrespective of sexual activity timing Balance Active Menopause Vaginal Moisturizing Lubricant, Canesintima Intimate Moisturizer, Replens, Rephresh, Sylk Natural Intimate Moisturizer, Yes Vaginal Moisturizer  Hybrids Properties of both water and silicone-based products (combination of a vaginal lubricant and moisturizer); Non-irritating; good option for women with allergies and sensitivities Lubrigyn, Luvena  Suppositories Hyaluronic acid to retain moisture Revaree  Vulvar Soothing Creams/Oils    Medicated Creams Pain and burn relief; Ingredients: 4% Lidocaine , Aloe Vera gel Releveum ([place])  Non-Medicated Creams For anti-itch and moisture/maintenance; Ingredients: Coconut oil, Avocado oil, Shea Butter, Olive oil, Vitamin E  Vajuvenate, Vmagic  Oils for moisture/maintenance: Coconut oil, Vitamin E  oil, Emu oil

## 2023-10-06 NOTE — Progress Notes (Signed)
 Name: Lorraine Leonard DOB: 1940/08/19 MRN: [id number]  History of Present Illness: [name] is a 83 y.o. female who presents today for follow up visit at [facility] [place]. She is accompanied by her son Lavonia Powers. Relevant history includes: 1. Solitary right kidney. - Left nephrectomy in 1983; 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. 2. CKD stage 3. GFR = 50 on 10/02/2023. 3. Recurrent UTI. 4. Vaginal atrophy.  Urine culture results in past 12 months: - 10/30/2022: Positive for Klebsiella pneumoniae - 03/06/2023: Positive for Klebsiella pneumoniae - 05/22/2023: Positive for Enterococcus asburiae - 07/21/2023: Positive for Klebsiella pneumoniae - 09/10/2023: Positive for Klebsiella pneumoniae - 10/02/2023: Positive for Klebsiella pneumoniae; treated with Keflex   At last visit with Dr. Joie Narrow on 07/14/2023: - She is no longer on estrogen cream. She does take cranberry capsules daily. - I strongly recommend that she take/use the estrogen cream on a regular basis. That prescription was sent in again.  Since last visit: Has had 3 positive urine cultures; treated elsewhere.  Currently on Keflex  for positive urine culture on 10/02/2023.  Today: She reports increased urinary urgency, frequency, nocturia, dysuria, hesitancy, and sensations of incomplete emptying. Reports voiding 15x/day and 4x/night. She denies gross hematuria, straining to void, fever. She denies flank pain or abdominal pain.  She denies consistent use of topical vaginal estrogen cream.  Medications: Current Outpatient Medications  Medication Sig Dispense Refill   albuterol  (VENTOLIN  HFA) 108 (90 Base) MCG/ACT inhaler INHALE 2 PUFFS INTO THE LUNGS EVERY 6 HOURS AS NEEDED FOR WHEEZE OR SHORTNESS OF BREATH 8.5 each 1   amLODipine  (NORVASC ) 5 MG tablet Take 1 tablet (5 mg total) by mouth daily. 90 tablet 2   apixaban  (ELIQUIS ) 5 MG TABS tablet TAKE 1 TABLET BY MOUTH TWICE A DAY 180 tablet 0   atenolol  (TENORMIN ) 25  MG tablet TAKE 1 TABLET (25 MG TOTAL) BY MOUTH DAILY. 90 tablet 0   calcium  citrate (CALCITRATE - DOSED IN MG ELEMENTAL CALCIUM ) 950 MG tablet Take 1 tablet by mouth 2 (two) times daily.      cephALEXin  (KEFLEX ) 250 MG capsule Take 1 capsule (250 mg total) by mouth daily. 30 capsule 5   cephALEXin  (KEFLEX ) 500 MG capsule Take 1 capsule (500 mg total) by mouth 2 (two) times daily for 7 days. 14 capsule 0   cyanocobalamin  1000 MCG tablet Take 1,000 mcg by mouth daily. Vitamin b12 every AM     diclofenac  Sodium (VOLTAREN ) 1 % GEL APPLY 4 GRAMS TOPICALLY 4 TIMES A DAY 400 g 1   estradiol  (ESTRACE  VAGINAL) 0.1 MG/GM vaginal cream Place 1 Applicatorful vaginally at bedtime. 42.5 g 12   estradiol  (ESTRACE ) 0.1 MG/GM vaginal cream Place 1 g vaginally daily. 42.5 g 3   fluticasone  (FLONASE ) 50 MCG/ACT nasal spray PLACE 1 SPRAY INTO BOTH NOSTRILS 2 (TWO) TIMES DAILY AS NEEDED FOR ALLERGIES OR RHINITIS. 48 mL 1   furosemide  (LASIX ) 20 MG tablet Take 1 tablet (20 mg total) by mouth daily. 30 tablet 3   Iron , Ferrous Sulfate , 325 (65 Fe) MG TABS Take 325 mg by mouth daily. 90 tablet 1   levocetirizine (XYZAL ) 5 MG tablet TAKE 1 TABLET BY MOUTH EVERY DAY IN THE EVENING 90 tablet 1   levothyroxine  (SYNTHROID ) 100 MCG tablet TAKE 1 TABLET BY MOUTH EVERY DAY 90 tablet 0   metFORMIN  (GLUCOPHAGE -XR) 500 MG 24 hr tablet Take 1 tablet (500 mg total) by mouth daily with breakfast. 90 tablet 0   Multiple Vitamins-Minerals (PRESERVISION AREDS  2) CAPS Take 1 capsule by mouth 2 (two) times daily.     Olopatadine  HCl 0.2 % SOLN INSTILL 1 DROP INTO BOTH EYES EVERY DAY 10 mL 5   omeprazole  (PRILOSEC) 20 MG capsule Take 1 capsule (20 mg total) by mouth daily. 90 capsule 2   ondansetron  (ZOFRAN ) 4 MG tablet Take 1 tablet (4 mg total) by mouth every 8 (eight) hours as needed for nausea or vomiting. 20 tablet 0   OXYGEN  Inhale 3 L into the lungs every evening.     Polyethyl Glycol-Propyl Glycol (SYSTANE) 0.4-0.3 % GEL ophthalmic  gel Place 1 application into both eyes at bedtime as needed (dry eyes).     predniSONE  (DELTASONE ) 5 MG tablet TAKE 1 TABLET (5 MG TOTAL) BY MOUTH 2 (TWO) TIMES DAILY WITH A MEAL. 180 tablet 1   rosuvastatin  (CRESTOR ) 10 MG tablet TAKE 1 TABLET BY MOUTH EVERYDAY AT BEDTIME 90 tablet 0   vitamin C  (ASCORBIC ACID ) 500 MG tablet Take 1,000 mg by mouth daily. AM     Vitamin D , Ergocalciferol , (DRISDOL ) 1.25 MG (50000 UNIT) CAPS capsule TAKE 1 CAPSULE (50,000 UNITS TOTAL) BY MOUTH EVERY 7 (SEVEN) DAYS 12 capsule 3   No current facility-administered medications for this visit.    Allergies: Allergies  Allergen Reactions   Allopurinol  Swelling    Feet and legs swell   Mobic  [Meloxicam ] Swelling and Other (See Comments)    Feet and Leg swelling   Macrodantin  [Nitrofurantoin  Macrocrystal]     Past Medical History:  Diagnosis Date   Allergic rhinitis    Anxiety    Anxiety    Arthritis    ~ all my joints (05/18/2017)   Atrial fibrillation (HCC) 09/2016   Atrial fibrillation with RVR (HCC) 05/18/2017   Chronic heart failure with preserved ejection fraction (HFpEF) (HCC)    Chronic kidney disease, stage 3a (HCC)    Chronic lower back pain    COPD (chronic obstructive pulmonary disease) (HCC)    Depression    Diverticulosis    GERD (gastroesophageal reflux disease)    Glaucoma    Gout    History of blood transfusion 1983   when I gave a 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   Hyperlipidemia    Hypertension    Hypothyroidism    Macular degeneration of both eyes    Neuropathy    On home oxygen  therapy    3L; 24/7 (05/18/2017)   Pre-diabetes    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   Pulmonary fibrosis (HCC)    Rheumatoid arthritis (HCC)    Sleep apnea    uses 3 liters of o2 24/7   Urticaria    Past Surgical History:  Procedure Laterality Date   AV NODE ABLATION  05/18/2017   AV NODE ABLATION N/A 05/18/2017   Procedure: AV NODE ABLATION;  Surgeon: Tammie Fall, MD;   Location: [facility];  Service: Cardiovascular;  Laterality: N/A;   BACK SURGERY     BREAST LUMPECTOMY Left    CARDIOVERSION N/A 01/30/2017   Procedure: CARDIOVERSION;  Surgeon: Loyde Rule, MD;  Location: [facility];  Service: Cardiovascular;  Laterality: N/A;   CARDIOVERSION N/A 04/09/2017   Procedure: CARDIOVERSION;  Surgeon: Gerard Knight, MD;  Location: [facility];  Service: Cardiovascular;  Laterality: N/A;   CATARACT EXTRACTION W/ INTRAOCULAR LENS  IMPLANT, BILATERAL Bilateral    DILATION AND CURETTAGE OF UTERUS     EUS N/A 10/03/2015   Procedure: UPPER ENDOSCOPIC ULTRASOUND (  EUS) RADIAL;  Surgeon: Evangeline Hilts, MD;  Location: [facility];  Service: Endoscopy;  Laterality: N/A;   FINE NEEDLE ASPIRATION N/A 10/03/2015   Procedure: FINE NEEDLE ASPIRATION (FNA) RADIAL;  Surgeon: Evangeline Hilts, MD;  Location: [facility];  Service: Endoscopy;  Laterality: N/A;   INSERT / REPLACE / REMOVE PACEMAKER  05/18/2017   LUMBAR LAMINECTOMY  1995; 06/23/2008   L4-5/notes 08/20/2010   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   NEPHRECTOMY TRANSPLANTED ORGAN     PACEMAKER IMPLANT N/A 05/18/2017   Procedure: PACEMAKER IMPLANT;  Surgeon: Tammie Fall, MD;  Location: [facility];  Service: Cardiovascular;  Laterality: N/A;   TOTAL ABDOMINAL HYSTERECTOMY     TUBAL LIGATION     Family History  Problem Relation Age of Onset   Diabetes Other    Hypertension Other    Coronary artery disease Other    Stroke Other    Asthma Other    Kidney disease Sister    Cancer Sister        lung   Lung cancer Sister    Cancer Sister        lung   Lung cancer Sister    Cancer Sister        lung   Suicidality Son        committed suicide in 2009   CVA Son    Hyperlipidemia Other    Anxiety disorder Other    Migraines Other    Heart failure Father    Healthy Brother    Cancer Sister        lung   Healthy Sister    Social History   Socioeconomic  History   Marital status: Widowed    Spouse name: Keilin Gamboa   Number of children: 4   Years of education: 9   Highest education level: 9th grade  Occupational History   Occupation: retired    Comment: cna  Tobacco Use   Smoking status: Never    Passive exposure: Never   Smokeless tobacco: Never  Vaping Use   Vaping status: Never Used  Substance and Sexual Activity   Alcohol  use: No   Drug use: No   Sexual activity: Not Currently    Partners: Male    Birth control/protection: Surgical  Other Topics Concern   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   Pt lives alone in single wide trailer. They have 4 children and 13 grandchildren.    Social Drivers of Corporate investment banker Strain: Low Risk  (03/28/2022)   Overall Financial Resource Strain (CARDIA)    Difficulty of Paying Living Expenses: Not hard at all  Food Insecurity: No Food Insecurity (03/28/2022)   Hunger Vital Sign    Worried About Running Out of Food in the Last Year: Never true    Ran Out of Food in the Last Year: Never true  Transportation Needs: No Transportation Needs (03/28/2022)   PRAPARE - Administrator, Civil Service (Medical): No    Lack of Transportation (Non-Medical): No  Physical Activity: Insufficiently Active (03/28/2022)   Exercise Vital Sign    Days of Exercise per Week: 3 days    Minutes of Exercise per Session: 30 min  Stress: No Stress Concern Present (03/28/2022)   [employer] of Occupational Health - Occupational Stress Questionnaire    Feeling of  Stress : Not at all  Social Connections: Moderately Isolated (03/28/2022)   Social Connection and Isolation Panel    Frequency of Communication with Friends and Family: More than three times a week    Frequency of Social Gatherings with Friends and Family: More than three times a week    Attends Religious Services: 1 to 4 times per year    Active Member of  [employer] or Organizations: No    Attends Banker Meetings: Never    Marital Status: Widowed  Intimate Partner Violence: Not At Risk (03/28/2022)   Humiliation, Afraid, Rape, and Kick questionnaire    Fear of Current or Ex-Partner: No    Emotionally Abused: No    Physically Abused: No    Sexually Abused: No    Review of Systems Constitutional: Patient reports fatigue  lntegumentary: Patient denies any rashes or pruritus Cardiovascular: Patient denies chest pain or syncope Respiratory: Patient denies shortness of breath Gastrointestinal: Patient denies nausea, vomiting, constipation, or diarrhea  Musculoskeletal: Patient denies muscle cramps or weakness Neurologic: Patient denies convulsions or seizures Allergic/Immunologic: Patient denies recent allergic reaction(s) Hematologic/Lymphatic: Patient denies bleeding tendencies Endocrine: Patient denies heat/cold intolerance  GU: As per HPI.  OBJECTIVE Vitals:   10/06/23 1541  BP: 126/69  Pulse: 85   There is no height or weight on file to calculate BMI.  Physical Examination Constitutional: No obvious distress; patient is non-toxic appearing  Cardiovascular: No visible lower extremity edema.  Respiratory: The patient does not have audible wheezing/stridor; respirations do not appear labored  Gastrointestinal: Abdomen non-distended Musculoskeletal: Normal ROM of UEs  Skin: No obvious rashes/open sores  Neurologic: CN 2-12 grossly intact Psychiatric: Answered questions appropriately with normal affect  Hematologic/Lymphatic/Immunologic: No obvious bruises or sites of spontaneous bleeding  Urine microscopy: 6-10 WBC/hpf, 0-2 RBC/hpf, 0 bacteria PVR: 25 ml  ASSESSMENT Recurrent UTI - Plan: Urinalysis, Routine w reflex microscopic, BLADDER SCAN AMB NON-IMAGING, cephALEXin  (KEFLEX ) 250 MG capsule  Atrophic vaginitis  Solitary kidney, acquired - Plan: cephALEXin  (KEFLEX ) 250 MG capsule  Immunosuppression due to drug  therapy (HCC) - Plan: cephALEXin  (KEFLEX ) 250 MG capsule  Ambulatory dysfunction  Dependent for transportation - Per PCP note on 10/02/2023: She lives alone and relies on her granddaughter for transportation, complicating her ability to attend appointments.  We discussed the possible etiologies of recurrent UTls including ascending infection related to intercourse; vaginal atrophy; transmural infection that has been treated incompletely; urinary tract stones; incomplete bladder emptying with urinary stasis; kidney or bladder tumor; urethral diverticulum; and colonization of  vagina and urinary tract with pathologic, adherent organisms.   Patient has known UTI risk factors including:  - use of immunosuppressant medication (Prednisone  for RA) - T2DM - vaginal atrophy  For UTI prevention advised: > Maintain adequate fluid intake daily to flush out the urinary tract. > Go to the bathroom to urinate every 4-6 hours while awake to minimize urinary stasis / bacterial overgrowth in the bladder. > Topical vaginal estrogen cream use. > UTI prophylaxis with a daily low dose antibiotic.   Handout provided with additional recommendations / options for UTI prevention.   We agreed to plan for follow up in 6 weeks or sooner if needed. Patient verbalized understanding of and agreement with current plan. All questions were answered.  PLAN Advised the following: 1. Start Keflex  (Cephalexin ) 250 mg daily for UTI prophylaxis 2. Continue topical vaginal estrogen cream at least 2-3 nights per week routinely. 3. Maintain adequate fluid intake daily to flush out the urinary  tract. 4. Urinate every 4-6 hours while awake to minimize urinary stasis / bacterial overgrowth in the bladder. 5. Consider OTC supplements for UTI prevention. 6. Return in about 6 weeks (around 11/17/2023) for Recurrent UTI, with UA & PVR.  Orders Placed This Encounter  Procedures   Urinalysis, Routine w reflex microscopic   BLADDER  SCAN AMB NON-IMAGING   Total time spent caring for the patient today was over 40 minutes. This includes time spent on the date of the visit reviewing the patient's chart before the visit, time spent during the visit, and time spent after the visit on documentation. Over 50% of that time was spent in face-to-face time with this patient for direct counseling. E&M based on time and complexity of medical decision making.  It has been explained that the patient is to follow regularly with their PCP in addition to all other providers involved in their care and to follow instructions provided by these respective offices. Patient advised to contact urology clinic if any urologic-pertaining questions, concerns, new symptoms or problems arise in the interim period.  Patient Instructions  PLAN Advised the following: 1. Start Keflex  (Cephalexin ) 250 mg daily for UTI prophylaxis 2. Continue topical vaginal estrogen cream at least 2-3 nights per week routinely. 3. Maintain adequate fluid intake daily to flush out the urinary tract. 4. Urinate every 4-6 hours while awake to minimize urinary stasis / bacterial overgrowth in the bladder. 5. Consider OTC supplements for UTI prevention. 6. Return in about 6 weeks (around 11/17/2023) for Recurrent UTI, with UA & PVR.     UTI prevention / management:  UTI symptoms may include:  - Pain / burning / discomfort when urinating - Recent increase in urinary urgency (how quickly you feel like you need to rush to the bathroom) - Recent increase in urinary frequency (how often you are urinating) - Fever - Acute mental status change / confusion - Fatigue / Feeling tired - Weakness - Note: Urine color, clarity, and odor are not considered to be clinically significant indicators of UTI and do not warrant urine testing unless patient is also experiencing UTI symptoms such as those listed above.  Difference between Urinalysis (urine dipstick test) and Urine culture / Why  urine culture often needed to determine appropriate diagnosis and treatment of urologic symptoms: > Urinalysis (urine dipstick test): A quick office test used as an indicator to determine whether or not further testing is necessary (such as a urine culture, urine microscopy, etc.) The urinalysis cannot differentiate a true bacterial UTI or give a definitive diagnosis for the findings.  > Urine culture: May be performed based on the findings of a urinalysis to evaluate for UTI. Grows out on a petri dish for 48-72 hours. Provides important information about: whether or not bacterial growth is present and if so: what the predominant bacteria is which antibiotics will work best against that bacteria That information is important so that we can diagnose and treat patients appropriately as there are other conditions which may mimic UTls which must not be missed (such as cancer, interstitial cystitis, stones, etc.). Assists us  with antibiotic stewardship to minimize patient's risk for developing antibiotic resistance (getting to a point where no antibiotics work anymore).  Options when UTI symptoms occur: 1. Call [facility]  and request to speak with triage nurse (phone # [phone], select option 3). In accordance with clinic guidelines the nurse will determine next steps based on patient-reported symptoms, which may include: same-day lab visit to provide urine  specimen, recommendation to schedule Urology office visit appointment for further evaluation, recommendation to proceed to ER, etc. 2. Call your Primary Care Provider (PCP) office to request urgent / same-day visit. Be sure to request for urine culture to be ordered and have results faxed to Urology (fax # [phone]).  3. Go to urgent care. Be sure to request for urine culture to be ordered and have results faxed to Urology (fax # [phone]).   For bladder pain/ burning with urination: - Can take over-the-counter  Pyridium  (phenazopyridine ; commonly known under the AZO brand) for a few days as needed. Limit use to no more than 3 days consecutively due to risk for methemoglobinemia, liver function issues, and bone health damage with long term use of Pyridium . - Alternative: Prescription urinary analgesics (such as Uribel, Urogesic blue, Urelle, Uro-MP). Often expensive / poorly covered by insurance unfortunately.  Options / recommendations for UTI prevention: - Low dose antibiotic daily for UTI prophylaxis. - Topical vaginal estrogen for vaginal atrophy (aka Genitourinary Syndrome of Menopause (GSM)). - Adequate daily fluid intake to flush out the urinary tract. - Go to the bathroom to urinate every 4-6 hours while awake to minimize urinary stasis / bacterial overgrowth in the bladder. - Proanthocyanidin (PAC) supplement 36 mg daily; must be soluble (insoluble form of PAC will be ineffective). Recommended brand: Ellura. This is an over-the-counter supplement (often must be found/ purchased online) supplement derived from cranberries with concentrated active component: Proanthocyanidin (PAC) 36 mg daily. Decreases bacterial adherence to bladder lining.  - D-mannose powder (2 grams daily). This is an over-the-counter supplement which decreases bacterial adherence to bladder lining (it is a sugar that inhibits bacterial adherence to urothelial cells by binding to the pili of enteric bacteria). Take as per manufacturer recommendation. Can be used as an alternative or in addition to the concentrated cranberry supplement.  - Vitamin C  supplement to acidify urine to minimize bacterial growth.  - Probiotic to maintain healthy vaginal microbiome to suppress bacteria at urethral opening. Brand recommendations: Estill Hemming (includes probiotic & D-mannose ), Feminine Balance (highest concentration of lactobacillus) or Hyperbiotic Pro 15.  Note for patients with diabetes:  - Be aware that D-mannose contains sugar.  Note for  patients with interstitial cystitis (IC):  - Patients with IC should typically avoid cranberry/ PAC supplements and Vitamin C  supplements due to their acidity, which may exacerbate IC-related bladder pain. - Symptoms of true bacterial UTI can overlap / mimic symptoms of an IC flare up. Antibiotic use is NOT indicated for IC flare ups. Urine culture needed prior to antibiotic treatment for IC patients. The goal is to minimize your risk for developing antibiotic-resistant bacteria.    Vaginal atrophy I Genitourinary syndrome of menopause (GSM):  What it is: Changes in the vaginal environment (including the vulva and urethra) including: Thinning of the epithelium (skin/ mucosa surface) Can contribute to urinary urgency and frequency Can contribute to dryness, itching, irritation of the vulvar and vaginal tissue Can contribute to pain with intercourse Can contribute to physical changes of the labia, vulva, and vagina such as: Narrowing of the vaginal opening Decreased vaginal length Loss of labial architecture Labial adhesions Pale color of vulvovaginal tissue Loss of pubic hair Allows bacteria to become adherent Results in increased risk for urinary tract infection (UTI) due to bacterial overgrowth and migration up the urethra into the bladder Change in vaginal pH (acid/ base balance) Allows for alteration / disruption of the normal bacterial flora / microbiome Results in increased risk for urinary  tract infection (UTI) due to bacterial overgrowth  Treatment options: Over-the-counter lubricants (see list below). Prescription vaginal estrogen replacement. Options: Topical vaginal estrogen (estradiol ) cream: (Estrace , Premarin , compounded) Apply as directed Estring  vaginal ring Exchanged every 3 months (either at home or in office by provider) Vagifem  vaginal tablet Inserted nightly for 2 weeks then twice a week (long term) lntrarosa vaginal suppository Vaginal DHEA: converts to  estrogen in vaginal tissue without systemic effect Inserted nightly (long term) 3. Vaginal laser therapy (Mona Edwina Gram touch) Performed in 3 treatments each 6 weeks apart (available in our [place] office). Can feel like a sunburn for 3-4 days after each treatment until new skin heals in. Usually not covered by insurance. Estimated cost is $1500 for all 3 sessions.  FYI regarding prescription vaginal estrogen treatment options: - All topical vaginal estrogen replacement options are equivalent in terms of efficacy. - Topical vaginal estrogen replacement will take about 3 months to be effective. - OK to have sex with any of the topical vaginal estrogen replacement options. - Topical vaginal estrogen replacement may sting/burn initially due to severe dryness, which will improve with ongoing treatment. - Studies have demonstrated negligible systemic absorption of low-dose intravaginal estrogen cream therefore the risk for cancer development or recurrence with this medication is minimal.  Genitourinary Syndrome of Menopause: AUA/SUFU/AUGS Guideline (2025) [place].at  Topical vaginal estrogen cream safe to use with breast cancer history [email or web address]  Topical vaginal estrogen cream safe to use with blood clot history [email or web address]   Lubricants and Moisturizers for Treating Genitourinary Syndrome of Menopause and Vulvovaginal Atrophy Treatment Comments I Available Products   Lubricants   Water-based Ingredients: Deionized water, glycerin, propylene glycol; latex safe; rare irritation; dry out with extended sexual activity Astroglide, Good Clean Love, K-Y Jelly,  Natural, Organic, Pink, Sliquid, Sylk, Yes    Oil Based Ingredients: avocado, olive, peanut, corn; latex safe; can be used with silicone products; staining; safe (unless peanut allergy); non-irritating Coconut oil, vegetable oil, vitamin E  oil  Silicone-Based Ingredients: Silicone polymers; staining; typically nonirritating, long lasting; waterproof; should not be used with silicone dilators, sexual toys, or gynecologic products Astroglide X, Oceanus Ultra Pure, Pink Silicone, Pjur Eros, Replens Silky Smooth, Silicone Premium JO, SKYN, Uberlube, [employer] Based Minimize harm to sperm motility; designed for couples trying to conceive:  Astroglide TTC, Conceive Plus, PreSeed, Yes Baby  Fertility Friendly Minimize harm to sperm motility; designed for couples trying to conceive:  Astroglide, TTC, Conceive Plus, PreSeed, Yes Baby  Vaginal Moisturizers   Vaginal Moisturizers For maintenance use 1 to 3 times weekly; can benefit women with dryness, chafing with AOL, and recurrent vaginal infections irrespective of sexual activity timing Balance Active Menopause Vaginal Moisturizing Lubricant, Canesintima Intimate Moisturizer, Replens, Rephresh, Sylk Natural Intimate Moisturizer, Yes Vaginal Moisturizer  Hybrids Properties of both water and silicone-based products (combination of a vaginal lubricant and moisturizer); Non-irritating; good option for women with allergies and sensitivities Lubrigyn, Luvena  Suppositories Hyaluronic acid to retain moisture Revaree  Vulvar Soothing Creams/Oils    Medicated Creams Pain and burn relief; Ingredients: 4% Lidocaine , Aloe Vera gel Releveum ([place])  Non-Medicated Creams For anti-itch and moisture/maintenance; Ingredients: Coconut oil, Avocado oil, Shea Butter, Olive oil, Vitamin E  Vajuvenate, Vmagic  Oils for moisture/maintenance: Coconut oil, Vitamin E  oil, Emu oil     Electronically signed by:  Lauretta Ponto, FNP   10/06/23     4:23 PM

## 2023-10-07 LAB — URINALYSIS, ROUTINE W REFLEX MICROSCOPIC
Bilirubin, UA: NEGATIVE
Glucose, UA: NEGATIVE
Ketones, UA: NEGATIVE
Nitrite, UA: NEGATIVE
Protein,UA: NEGATIVE
RBC, UA: NEGATIVE
Specific Gravity, UA: 1.01 (ref 1.005–1.030)
Urobilinogen, Ur: 0.2 mg/dL (ref 0.2–1.0)
pH, UA: 6 (ref 5.0–7.5)

## 2023-10-07 LAB — MICROSCOPIC EXAMINATION: Bacteria, UA: NONE SEEN

## 2023-10-08 ENCOUNTER — Other Ambulatory Visit: Payer: Self-pay | Admitting: Family

## 2023-10-08 DIAGNOSIS — J449 Chronic obstructive pulmonary disease, unspecified: Secondary | ICD-10-CM

## 2023-10-09 ENCOUNTER — Other Ambulatory Visit: Payer: Self-pay | Admitting: Family

## 2023-10-09 ENCOUNTER — Telehealth: Payer: Self-pay

## 2023-10-09 NOTE — Telephone Encounter (Signed)
 Pt daughter called to get a refill estradiol  cream advised pt daughter that pt has refills on cream to try to call pharmacy to get a refill pt stated she believes her mother lost it b/c she loses things a lot pt advised if pharmacy does not have a refills to c/b so a message could be sent to the provider

## 2023-10-19 ENCOUNTER — Ambulatory Visit: Admitting: Family

## 2023-10-22 ENCOUNTER — Ambulatory Visit (INDEPENDENT_AMBULATORY_CARE_PROVIDER_SITE_OTHER): Admitting: Family

## 2023-10-22 ENCOUNTER — Encounter: Payer: Self-pay | Admitting: Family

## 2023-10-22 VITALS — BP 143/74 | HR 73 | Temp 98.0°F | Ht 67.0 in

## 2023-10-22 DIAGNOSIS — K5901 Slow transit constipation: Secondary | ICD-10-CM

## 2023-10-22 DIAGNOSIS — E1142 Type 2 diabetes mellitus with diabetic polyneuropathy: Secondary | ICD-10-CM | POA: Diagnosis not present

## 2023-10-22 DIAGNOSIS — E1169 Type 2 diabetes mellitus with other specified complication: Secondary | ICD-10-CM | POA: Diagnosis not present

## 2023-10-22 DIAGNOSIS — R300 Dysuria: Secondary | ICD-10-CM | POA: Diagnosis not present

## 2023-10-22 DIAGNOSIS — E785 Hyperlipidemia, unspecified: Secondary | ICD-10-CM | POA: Diagnosis not present

## 2023-10-22 DIAGNOSIS — M159 Polyosteoarthritis, unspecified: Secondary | ICD-10-CM

## 2023-10-22 DIAGNOSIS — I152 Hypertension secondary to endocrine disorders: Secondary | ICD-10-CM | POA: Diagnosis not present

## 2023-10-22 DIAGNOSIS — E039 Hypothyroidism, unspecified: Secondary | ICD-10-CM

## 2023-10-22 DIAGNOSIS — E1159 Type 2 diabetes mellitus with other circulatory complications: Secondary | ICD-10-CM | POA: Diagnosis not present

## 2023-10-22 DIAGNOSIS — J449 Chronic obstructive pulmonary disease, unspecified: Secondary | ICD-10-CM | POA: Diagnosis not present

## 2023-10-22 DIAGNOSIS — K219 Gastro-esophageal reflux disease without esophagitis: Secondary | ICD-10-CM

## 2023-10-22 DIAGNOSIS — F331 Major depressive disorder, recurrent, moderate: Secondary | ICD-10-CM

## 2023-10-22 DIAGNOSIS — B3731 Acute candidiasis of vulva and vagina: Secondary | ICD-10-CM

## 2023-10-22 MED ORDER — FLUCONAZOLE 150 MG PO TABS: 150.0000 mg | ORAL_TABLET | ORAL | 0 refills | Status: DC | PRN

## 2023-10-22 MED ORDER — FUROSEMIDE 40 MG PO TABS: 40.0000 mg | ORAL_TABLET | Freq: Every day | ORAL | 1 refills | Status: DC

## 2023-10-22 NOTE — Patient Instructions (Signed)

## 2023-10-22 NOTE — Progress Notes (Signed)
 Subjective:    Patient ID: Lorraine Leonard, female    DOB: [birth year], 83 y.o.   MRN: [id number]  Chief Complaint  Patient presents with   Medical Management of Chronic Issues    Still having urinary issues and seeing urologist having right flank pain.   Pt presents to the office today for chronic follow up.    She is followed by Cardiologists every 6 months for CHF & A Fib. She is followed by Pulmonologist annually for pulmonary hypertension, COPD, and Pulmonary Fibrosis. She was told to decrease her oxygen  to as needed. Pt is not on oxygen  today.  She reports her breathing is stable, but SOB with exertion.   Has atherosclerosis and takes Crestor .    She is followed by Nephrologists as needed for CKD. Avoids NSAID's.   She is followed by Urologists for recurrent UTI's annually. Hypertension This is a chronic problem. The current episode started more than 1 year ago. The problem has been waxing and waning since onset. The problem is uncontrolled. Associated symptoms include anxiety, blurred vision, malaise/fatigue, peripheral edema and shortness of breath. Risk factors for coronary artery disease include dyslipidemia, diabetes mellitus, obesity, sedentary lifestyle and post-menopausal state. The current treatment provides mild improvement. Identifiable causes of hypertension include a thyroid  problem.  Congestive Heart Failure Presents for follow-up visit. Associated symptoms include edema, fatigue and shortness of breath. The symptoms have been stable.  Gastroesophageal Reflux She complains of belching and heartburn. She reports no nausea. This is a chronic problem. The current episode started more than 1 year ago. The problem occurs occasionally. The symptoms are aggravated by certain foods. Associated symptoms include fatigue. Risk factors include obesity. She has tried a PPI for the symptoms. The treatment provided moderate relief.  Diabetes She presents for her follow-up diabetic visit.  She has type 2 diabetes mellitus. Hypoglycemia symptoms include nervousness/anxiousness. Associated symptoms include blurred vision, fatigue and foot paresthesias. Symptoms are stable. 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 Risk factors for coronary artery disease include dyslipidemia, diabetes mellitus, hypertension, sedentary lifestyle and post-menopausal. She is following a generally unhealthy diet. (Not checking glucose at home)  Hyperlipidemia This is a chronic problem. The current episode started more than 1 year ago. The problem is controlled. Recent lipid tests were reviewed and are normal. Exacerbating diseases include obesity. Associated symptoms include shortness of breath. Current antihyperlipidemic treatment includes statins. The current treatment provides moderate improvement of lipids. Risk factors for coronary artery disease include dyslipidemia, diabetes mellitus, hypertension, a sedentary lifestyle, post-menopausal and obesity.  Thyroid  Problem Presents for follow-up visit. Symptoms include anxiety, constipation and fatigue. The symptoms have been stable.  Arthritis Presents for follow-up visit. She complains of pain and stiffness. Affected locations include the left knee, right knee, left MCP and right MCP. Her pain is at a severity of 5/10. Associated symptoms include dysuria and fatigue.  Depression        This is a chronic problem.  The current episode started more than 1 year ago.   The problem occurs intermittently.  Associated symptoms include fatigue, restlessness and sad.  Associated symptoms include no helplessness and no hopelessness.  Past treatments include nothing.  Past medical history includes thyroid  problem and anxiety.   Anxiety Presents for follow-up visit. Symptoms include excessive worry, nervous/anxious behavior, restlessness and shortness of breath. Patient reports no nausea. Symptoms occur occasionally. The severity of symptoms is  moderate.    Constipation This is a chronic problem. The current episode started more  than 1 year ago. The problem has been waxing and waning since onset. Her stool frequency is 1 time per day. Pertinent negatives include no nausea or vomiting. Risk factors include obesity. She has tried diet changes and stool softeners for the symptoms. The treatment provided moderate relief.  Dysuria  This is a recurrent problem. The current episode started 1 to 4 weeks ago. The problem occurs every urination. The problem has been gradually worsening. The quality of the pain is described as burning. The pain is at a severity of 10/10. The pain is moderate. Associated symptoms include flank pain, frequency, hesitancy and urgency. Pertinent negatives include no hematuria, nausea or vomiting. She has tried increased fluids for the symptoms. The treatment provided mild relief.      Review of Systems  Constitutional:  Positive for fatigue and malaise/fatigue.  Eyes:  Positive for blurred vision.  Respiratory:  Positive for shortness of breath.   Gastrointestinal:  Positive for constipation and heartburn. Negative for nausea and vomiting.  Genitourinary:  Positive for dysuria, flank pain, frequency, hesitancy and urgency. Negative for hematuria.  Musculoskeletal:  Positive for stiffness.  Psychiatric/Behavioral:  The patient is nervous/anxious.   All other systems reviewed and are negative.  Family History  Problem Relation Age of Onset   Diabetes Other    Hypertension Other    Coronary artery disease Other    Stroke Other    Asthma Other    Kidney disease Sister    Cancer Sister        lung   Lung cancer Sister    Cancer Sister        lung   Lung cancer Sister    Cancer Sister        lung   Suicidality Son        committed suicide in 2009   CVA Son    Hyperlipidemia Other    Anxiety disorder Other    Migraines Other    Heart failure Father    Healthy Brother    Cancer Sister        lung    Healthy Sister    Social History   Socioeconomic History   Marital status: Widowed    Spouse name: Prisilla Kocsis   Number of children: 4   Years of education: 9   Highest education level: 9th grade  Occupational History   Occupation: retired    Comment: cna  Tobacco Use   Smoking status: Never    Passive exposure: Never   Smokeless tobacco: Never  Vaping Use   Vaping status: Never Used  Substance and Sexual Activity   Alcohol  use: No   Drug use: No   Sexual activity: Not Currently    Partners: Male    Birth control/protection: Surgical  Other Topics Concern   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   Pt lives alone in single wide trailer. They have 4 children and 13 grandchildren.    Social Drivers of Corporate investment banker Strain: Low Risk  (03/28/2022)   Overall Financial Resource Strain (CARDIA)    Difficulty of Paying Living Expenses: Not hard at all  Food Insecurity: No Food Insecurity (03/28/2022)   Hunger Vital Sign    Worried About Running Out of Food in the Last Year: Never true    Ran Out of Food in the Last Year: Never true  Transportation Needs: No Transportation  Needs (03/28/2022)   PRAPARE - Administrator, Civil Service (Medical): No    Lack of Transportation (Non-Medical): No  Physical Activity: Insufficiently Active (03/28/2022)   Exercise Vital Sign    Days of Exercise per Week: 3 days    Minutes of Exercise per Session: 30 min  Stress: No Stress Concern Present (03/28/2022)   [employer] of Occupational Health - Occupational Stress Questionnaire    Feeling of Stress : Not at all  Social Connections: Moderately Isolated (03/28/2022)   Social Connection and Isolation Panel    Frequency of Communication with Friends and Family: More than three times a week    Frequency of Social Gatherings with Friends and Family: More than three times a week    Attends Religious  Services: 1 to 4 times per year    Active Member of [employer] or Organizations: No    Attends Banker Meetings: Never    Marital Status: Widowed        Objective:   Physical Exam Vitals reviewed.  Constitutional:      General: She is not in acute distress.    Appearance: She is well-developed. She is obese.  HENT:     Head: Normocephalic and atraumatic.     Right Ear: Tympanic membrane normal.     Left Ear: Tympanic membrane normal.  Eyes:     Pupils: Pupils are equal, round, and reactive to light.  Neck:     Thyroid : No thyromegaly.  Cardiovascular:     Rate and Rhythm: Normal rate and regular rhythm.     Heart sounds: Normal heart sounds. No murmur heard. Pulmonary:     Effort: Pulmonary effort is normal. No respiratory distress.     Breath sounds: Normal breath sounds. Decreased air movement present. No wheezing.  Abdominal:     General: Bowel sounds are normal. There is no distension.     Palpations: Abdomen is soft.     Tenderness: There is no abdominal tenderness.  Genitourinary:    Comments: Labia erythemas  Musculoskeletal:        General: No tenderness. Normal range of motion.     Cervical back: Normal range of motion and neck supple.     Right lower leg: Edema (2+) present.     Left lower leg: Edema (2+) present.  Skin:    General: Skin is warm and dry.  Neurological:     Mental Status: She is alert and oriented to person, place, and time.     Cranial Nerves: No cranial nerve deficit.     Deep Tendon Reflexes: Reflexes are normal and symmetric.  Psychiatric:        Behavior: Behavior normal.        Thought Content: Thought content normal.        Judgment: Judgment normal.        BP (!) 143/74   Pulse 73   Temp 98 F (36.7 C) (Temporal)   Ht 5' 7 (1.702 m)   BMI 29.60 kg/m   Assessment & Plan:  TRILBY WAY comes in today with chief complaint of Medical Management of Chronic Issues (Still having urinary issues and seeing urologist  having right flank pain.)   Diagnosis and orders addressed:  1. Dysuria (Primary) - Urinalysis, Complete - BMP8+EGFR  2. Hypertension associated with diabetes (HCC) - BMP8+EGFR  3. Hyperlipidemia associated with type 2 diabetes mellitus (HCC) - BMP8+EGFR  4. Hypothyroidism, unspecified type - BMP8+EGFR  5. Gastroesophageal reflux disease,  unspecified whether esophagitis present - BMP8+EGFR  6. Moderate episode of recurrent major depressive disorder (HCC)  - BMP8+EGFR  7. Slow transit constipation - BMP8+EGFR  8. Type 2 diabetes mellitus with diabetic polyneuropathy, without long-term current use of insulin  (HCC) - BMP8+EGFR  9. Chronic obstructive pulmonary disease, unspecified COPD type (HCC) - BMP8+EGFR  10. Osteoarthritis of multiple joints, unspecified osteoarthritis type  - BMP8+EGFR  11. Vagina, candidiasis Start diflucan   Use Desitin  - fluconazole  (DIFLUCAN ) 150 MG tablet; Take 1 tablet (150 mg total) by mouth every three (3) days as needed.  Dispense: 3 tablet; Refill: 0 - BMP8+EGFR   Labs pending Continue current medications  Low salt diet  Wear compression hose  Health Maintenance reviewed Diet and exercise encouraged  Follow up plan: 3 months    Bari Learn, FNP

## 2023-10-23 LAB — BMP8+EGFR
BUN/Creatinine Ratio: 24 (ref 12–28)
BUN: 26 mg/dL (ref 8–27)
CO2: 21 mmol/L (ref 20–29)
Calcium: 10.7 mg/dL — ABNORMAL HIGH (ref 8.7–10.3)
Chloride: 98 mmol/L (ref 96–106)
Creatinine, Ser: 1.08 mg/dL — ABNORMAL HIGH (ref 0.57–1.00)
Glucose: 167 mg/dL — ABNORMAL HIGH (ref 70–99)
Potassium: 4.5 mmol/L (ref 3.5–5.2)
Sodium: 140 mmol/L (ref 134–144)
eGFR: 51 mL/min/1.73 — ABNORMAL LOW (ref >59)

## 2023-10-26 ENCOUNTER — Ambulatory Visit: Payer: Self-pay | Admitting: Family

## 2023-10-26 DIAGNOSIS — I509 Heart failure, unspecified: Secondary | ICD-10-CM | POA: Diagnosis not present

## 2023-10-26 DIAGNOSIS — J8410 Pulmonary fibrosis, unspecified: Secondary | ICD-10-CM | POA: Diagnosis not present

## 2023-10-26 DIAGNOSIS — J449 Chronic obstructive pulmonary disease, unspecified: Secondary | ICD-10-CM | POA: Diagnosis not present

## 2023-10-26 LAB — URINALYSIS, COMPLETE
Bilirubin, UA: NEGATIVE
Glucose, UA: NEGATIVE
Ketones, UA: NEGATIVE
Leukocytes,UA: NEGATIVE
Nitrite, UA: NEGATIVE
Protein,UA: NEGATIVE
Specific Gravity, UA: 1.01 (ref 1.005–1.030)
Urobilinogen, Ur: 0.2 mg/dL (ref 0.2–1.0)
pH, UA: 6 (ref 5.0–7.5)

## 2023-10-31 ENCOUNTER — Other Ambulatory Visit: Payer: Self-pay | Admitting: Family

## 2023-10-31 DIAGNOSIS — J449 Chronic obstructive pulmonary disease, unspecified: Secondary | ICD-10-CM

## 2023-11-04 ENCOUNTER — Ambulatory Visit: Payer: Self-pay

## 2023-11-04 NOTE — Telephone Encounter (Signed)
 FYI Only or Action Required?: FYI only for provider.  Patient was last seen in primary care on 10/22/2023 by Lavell Bari LABOR, FNP.  Called Nurse Triage reporting Leg Injury.  Symptoms began 2 weeks ago.  Interventions attempted: Rest, hydration, or home remedies.  Symptoms are: gradually worsening.  Triage Disposition: See Physician Within 24 Hours  Patient/caregiver understands and will follow disposition?: Yes     Copied from CRM [id number]. Topic: Clinical - Red Word Triage >> Nov 04, 2023  4:19 PM Miquel SAILOR wrote: Red Word that prompted transfer to Nurse Triage: Smashed LT leg from rocking chair for 2 weeks/Not in leg/ swollen/ sore puss/Black purple color    Reason for Disposition  Large swelling or bruise (> 2 inches or 5 cm)  Answer Assessment - Initial Assessment Questions 1. MECHANISM: How did the injury happen? (e.g., twisting injury, direct blow)      Hit leg against rocking chair  2. ONSET: When did the injury happen? (e.g., minutes, hours ago)      3 weeks ago  3. LOCATION: Where is the injury located?      Left leg calf  4. APPEARANCE of INJURY: What does the injury look like?  (e.g., deformity of leg)     Know with bruise and selling down to foot  5. SEVERITY: Can you put weight on that leg? Can you walk?      Yes  6. SIZE: For cuts, bruises, or swelling, ask: How large is it? (e.g., inches or centimeters)      Swelling from calf down to foot  7. PAIN: Is there pain? If Yes, ask: How bad is the pain?   What does it keep you from doing? (Scale 0-10; or none, mild, moderate, severe)     Mild  9. OTHER SYMPTOMS: Do you have any other symptoms?      No  Protocols used: Leg Injury-A-AH

## 2023-11-05 ENCOUNTER — Other Ambulatory Visit: Payer: Self-pay | Admitting: Family

## 2023-11-05 ENCOUNTER — Ambulatory Visit: Admitting: Family Medicine

## 2023-11-05 ENCOUNTER — Encounter: Payer: Self-pay | Admitting: Family Medicine

## 2023-11-05 VITALS — BP 117/57 | HR 67 | Temp 97.7°F

## 2023-11-05 DIAGNOSIS — I152 Hypertension secondary to endocrine disorders: Secondary | ICD-10-CM

## 2023-11-05 DIAGNOSIS — T148XXA Other injury of unspecified body region, initial encounter: Secondary | ICD-10-CM

## 2023-11-05 DIAGNOSIS — E559 Vitamin D deficiency, unspecified: Secondary | ICD-10-CM

## 2023-11-05 DIAGNOSIS — F331 Major depressive disorder, recurrent, moderate: Secondary | ICD-10-CM

## 2023-11-05 DIAGNOSIS — E1142 Type 2 diabetes mellitus with diabetic polyneuropathy: Secondary | ICD-10-CM

## 2023-11-05 DIAGNOSIS — S8012XA Contusion of left lower leg, initial encounter: Secondary | ICD-10-CM

## 2023-11-05 DIAGNOSIS — F411 Generalized anxiety disorder: Secondary | ICD-10-CM

## 2023-11-05 NOTE — Progress Notes (Signed)
 Subjective:  Patient ID: Lorraine Leonard, female    DOB: 05-21-1940, 83 y.o.   MRN: [id number]  Patient Care Team: Lavell Bari LABOR, FNP as PCP - General (Family Medicine) Waddell Danelle ORN, MD as PCP - Electrophysiology (Cardiology) Alvan Dorn FALCON, MD as PCP - Cardiology (Cardiology) Darlean Ozell NOVAK, MD as Consulting Physician (Pulmonary Disease) Matilda Senior, MD as Consulting Physician (Urology) Vicci Mcardle, [place] (Optometry)   Chief Complaint:  Laceration (Left calf x 2 weeks after hitting on rocking chair )   HPI: Lorraine Leonard is a 83 y.o. female presenting on 11/05/2023 for Laceration (Left calf x 2 weeks after hitting on rocking chair )   Lorraine Leonard is an 83 year old female who presents with a hematoma on her leg after hitting it on a rocking chair.  Approximately two weeks ago, she sustained an injury to her leg when she hit it on a rocking chair, resulting in a hematoma. The hematoma is green in color, and she is unsure if the condition is improving.  She is currently on Keflex  for a urinary tract infection, with three to four days remaining on the course.          Relevant past medical, surgical, family, and social history reviewed and updated as indicated.  Allergies and medications reviewed and updated. Data reviewed: Chart in Epic.   Past Medical History:  Diagnosis Date   Allergic rhinitis    Anxiety    Anxiety    Arthritis    ~ all my joints (05/18/2017)   Atrial fibrillation (HCC) 09/2016   Atrial fibrillation with RVR (HCC) 05/18/2017   Chronic heart failure with preserved ejection fraction (HFpEF) (HCC)    Chronic kidney disease, stage 3a (HCC)    Chronic lower back pain    COPD (chronic obstructive pulmonary disease) (HCC)    Depression    Diverticulosis    GERD (gastroesophageal reflux disease)    Glaucoma    Gout    History of blood transfusion 1983   when I gave a 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   Hyperlipidemia    Hypertension     Hypothyroidism    Macular degeneration of both eyes    Neuropathy    On home oxygen  therapy    3L; 24/7 (05/18/2017)   Pre-diabetes    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   Pulmonary fibrosis (HCC)    Rheumatoid arthritis (HCC)    Sleep apnea    uses 3 liters of o2 24/7   Urticaria     Past Surgical History:  Procedure Laterality Date   AV NODE ABLATION  05/18/2017   AV NODE ABLATION N/A 05/18/2017   Procedure: AV NODE ABLATION;  Surgeon: Waddell Danelle ORN, MD;  Location: [facility];  Service: Cardiovascular;  Laterality: N/A;   BACK SURGERY     BREAST LUMPECTOMY Left    CARDIOVERSION N/A 01/30/2017   Procedure: CARDIOVERSION;  Surgeon: Delford Maude BROCKS, MD;  Location: [facility];  Service: Cardiovascular;  Laterality: N/A;   CARDIOVERSION N/A 04/09/2017   Procedure: CARDIOVERSION;  Surgeon: Debera Jayson MATSU, MD;  Location: [facility];  Service: Cardiovascular;  Laterality: N/A;   CATARACT EXTRACTION W/ INTRAOCULAR LENS  IMPLANT, BILATERAL Bilateral    DILATION AND CURETTAGE OF UTERUS     EUS N/A 10/03/2015   Procedure: UPPER ENDOSCOPIC ULTRASOUND (EUS) RADIAL;  Surgeon: Elsie Cree, MD;  Location: [facility];  Service: Endoscopy;  Laterality: N/A;  FINE NEEDLE ASPIRATION N/A 10/03/2015   Procedure: FINE NEEDLE ASPIRATION (FNA) RADIAL;  Surgeon: Elsie Cree, MD;  Location: [facility];  Service: Endoscopy;  Laterality: N/A;   INSERT / REPLACE / REMOVE PACEMAKER  05/18/2017   LUMBAR LAMINECTOMY  1995; 06/23/2008   L4-5/notes 08/20/2010   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   NEPHRECTOMY TRANSPLANTED ORGAN     PACEMAKER IMPLANT N/A 05/18/2017   Procedure: PACEMAKER IMPLANT;  Surgeon: Waddell Danelle ORN, MD;  Location: [facility];  Service: Cardiovascular;  Laterality: N/A;   TOTAL ABDOMINAL HYSTERECTOMY     TUBAL LIGATION      Social History   Socioeconomic History   Marital status: Widowed    Spouse name:  Sharmila Wrobleski   Number of children: 4   Years of education: 9   Highest education level: 9th grade  Occupational History   Occupation: retired    Comment: cna  Tobacco Use   Smoking status: Never    Passive exposure: Never   Smokeless tobacco: Never  Vaping Use   Vaping status: Never Used  Substance and Sexual Activity   Alcohol  use: No   Drug use: No   Sexual activity: Not Currently    Partners: Male    Birth control/protection: Surgical  Other Topics Concern   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   Pt lives alone in single wide trailer. They have 4 children and 13 grandchildren.    Social Drivers of Corporate investment banker Strain: Low Risk  (03/28/2022)   Overall Financial Resource Strain (CARDIA)    Difficulty of Paying Living Expenses: Not hard at all  Food Insecurity: No Food Insecurity (03/28/2022)   Hunger Vital Sign    Worried About Running Out of Food in the Last Year: Never true    Ran Out of Food in the Last Year: Never true  Transportation Needs: No Transportation Needs (03/28/2022)   PRAPARE - Administrator, Civil Service (Medical): No    Lack of Transportation (Non-Medical): No  Physical Activity: Insufficiently Active (03/28/2022)   Exercise Vital Sign    Days of Exercise per Week: 3 days    Minutes of Exercise per Session: 30 min  Stress: No Stress Concern Present (03/28/2022)   [employer] of Occupational Health - Occupational Stress Questionnaire    Feeling of Stress : Not at all  Social Connections: Moderately Isolated (03/28/2022)   Social Connection and Isolation Panel    Frequency of Communication with Friends and Family: More than three times a week    Frequency of Social Gatherings with Friends and Family: More than three times a week    Attends Religious Services: 1 to 4 times per year    Active Member of [employer] or Organizations: No    Attends Tax inspector Meetings: Never    Marital Status: Widowed  Intimate Partner Violence: Not At Risk (03/28/2022)   Humiliation, Afraid, Rape, and Kick questionnaire    Fear of Current or Ex-Partner: No    Emotionally Abused: No    Physically Abused: No    Sexually Abused: No    Outpatient Encounter Medications as of 11/05/2023  Medication Sig   albuterol  (VENTOLIN  HFA) 108 (90 Base) MCG/ACT inhaler INHALE 2 PUFFS INTO THE LUNGS EVERY 6 HOURS AS NEEDED FOR WHEEZE OR SHORTNESS OF BREATH  amLODipine  (NORVASC ) 5 MG tablet Take 1 tablet (5 mg total) by mouth daily.   atenolol  (TENORMIN ) 25 MG tablet TAKE 1 TABLET (25 MG TOTAL) BY MOUTH DAILY.   calcium  citrate (CALCITRATE - DOSED IN MG ELEMENTAL CALCIUM ) 950 MG tablet Take 1 tablet by mouth 2 (two) times daily.    cephALEXin  (KEFLEX ) 250 MG capsule Take 1 capsule (250 mg total) by mouth daily.   cyanocobalamin  1000 MCG tablet Take 1,000 mcg by mouth daily. Vitamin b12 every AM   diclofenac  Sodium (VOLTAREN ) 1 % GEL APPLY 4 GRAMS TOPICALLY 4 TIMES A DAY   ELIQUIS  5 MG TABS tablet TAKE 1 TABLET BY MOUTH TWICE A DAY   estradiol  (ESTRACE  VAGINAL) 0.1 MG/GM vaginal cream Place 1 Applicatorful vaginally at bedtime.   estradiol  (ESTRACE ) 0.1 MG/GM vaginal cream Place 1 g vaginally daily.   fluticasone  (FLONASE ) 50 MCG/ACT nasal spray PLACE 1 SPRAY INTO BOTH NOSTRILS 2 (TWO) TIMES DAILY AS NEEDED FOR ALLERGIES OR RHINITIS.   furosemide  (LASIX ) 40 MG tablet Take 1 tablet (40 mg total) by mouth daily.   Iron , Ferrous Sulfate , 325 (65 Fe) MG TABS Take 325 mg by mouth daily.   levocetirizine (XYZAL ) 5 MG tablet TAKE 1 TABLET BY MOUTH EVERY DAY IN THE EVENING   levothyroxine  (SYNTHROID ) 100 MCG tablet TAKE 1 TABLET BY MOUTH EVERY DAY   lisinopril  (ZESTRIL ) 10 MG tablet TAKE 1 TABLET BY MOUTH EVERY DAY   metFORMIN  (GLUCOPHAGE -XR) 500 MG 24 hr tablet TAKE 1 TABLET BY MOUTH EVERY DAY WITH BREAKFAST   Multiple Vitamins-Minerals (PRESERVISION AREDS 2) CAPS Take 1  capsule by mouth 2 (two) times daily.   Olopatadine  HCl 0.2 % SOLN INSTILL 1 DROP INTO BOTH EYES EVERY DAY   omeprazole  (PRILOSEC) 20 MG capsule Take 1 capsule (20 mg total) by mouth daily.   ondansetron  (ZOFRAN ) 4 MG tablet Take 1 tablet (4 mg total) by mouth every 8 (eight) hours as needed for nausea or vomiting.   OXYGEN  Inhale 3 L into the lungs every evening.   Polyethyl Glycol-Propyl Glycol (SYSTANE) 0.4-0.3 % GEL ophthalmic gel Place 1 application into both eyes at bedtime as needed (dry eyes).   rosuvastatin  (CRESTOR ) 10 MG tablet TAKE 1 TABLET BY MOUTH EVERYDAY AT BEDTIME   vitamin C  (ASCORBIC ACID ) 500 MG tablet Take 1,000 mg by mouth daily. AM   Vitamin D , Ergocalciferol , (DRISDOL ) 1.25 MG (50000 UNIT) CAPS capsule TAKE 1 CAPSULE (50,000 UNITS TOTAL) BY MOUTH EVERY 7 (SEVEN) DAYS   fluconazole  (DIFLUCAN ) 150 MG tablet Take 1 tablet (150 mg total) by mouth every three (3) days as needed. (Patient not taking: Reported on 11/05/2023)   predniSONE  (DELTASONE ) 5 MG tablet TAKE 1 TABLET (5 MG TOTAL) BY MOUTH 2 (TWO) TIMES DAILY WITH A MEAL. (Patient not taking: Reported on 11/05/2023)   No facility-administered encounter medications on file as of 11/05/2023.    Allergies  Allergen Reactions   Allopurinol  Swelling    Feet and legs swell   Mobic  [Meloxicam ] Swelling and Other (See Comments)    Feet and Leg swelling   Macrodantin  [Nitrofurantoin  Macrocrystal]     Pertinent ROS per HPI, otherwise unremarkable      Objective:  BP (!) 117/57   Pulse 67   Temp 97.7 F (36.5 C)   SpO2 99%    Wt Readings from Last 3 Encounters:  09/10/23 189 lb (85.7 kg)  07/21/23 189 lb (85.7 kg)  06/29/23 190 lb (86.2 kg)    Physical Exam Vitals  and nursing note reviewed.  Constitutional:      Appearance: She is ill-appearing (chronically ill, frail elderly).  HENT:     Head: Normocephalic and atraumatic.     Nose: Nose normal.     Mouth/Throat:     Mouth: Mucous membranes are moist.   Eyes:     Pupils: Pupils are equal, round, and reactive to light.  Cardiovascular:     Rate and Rhythm: Normal rate. Rhythm irregularly irregular.  Pulmonary:     Effort: Pulmonary effort is normal.  Musculoskeletal:     Cervical back: Neck supple.     Right lower leg: Edema present.     Left lower leg: Edema present.  Skin:    General: Skin is warm and dry.     Capillary Refill: Capillary refill takes less than 2 seconds.      Neurological:     General: No focal deficit present.     Mental Status: She is alert and oriented to person, place, and time.     Motor: Weakness (generalized, in wheelchair) present.  Psychiatric:        Mood and Affect: Mood normal.        Behavior: Behavior normal. Behavior is cooperative.        Thought Content: Thought content normal.        Judgment: Judgment normal.    I & D  Date/Time: 11/05/2023 2:58 PM  Performed by: Severa Rock HERO, FNP Authorized by: Severa Rock HERO, FNP   Consent:    Consent obtained:  Verbal   Consent given by:  Patient and healthcare agent   Risks, benefits, and alternatives were discussed: yes     Risks discussed:  Bleeding, incomplete drainage, pain, infection and damage to other organs   Alternatives discussed:  No treatment, delayed treatment, alternative treatment, observation and referral Universal protocol:    Procedure explained and questions answered to patient or proxy's satisfaction: yes     Immediately prior to procedure a time out was called: yes     Patient identity confirmed:  Verbally with patient Location:    Type:  Hematoma   Size:  4 cm   Location:  Lower extremity   Lower extremity location:  Leg   Leg location:  L lower leg Pre-procedure details:    Skin preparation:  Povidone-iodine Sedation:    Sedation type:  None Anesthesia:    Anesthesia method:  Topical application   Topical anesthesia: ethyl chloride spray. Procedure type:    Complexity:  Simple Procedure details:    Incision  types:  Single straight   Scalpel blade:  11   Wound management:  Probed and deloculated, debrided and extensive cleaning   Drainage:  Bloody   Drainage amount:  Moderate   Wound treatment:  Wound left open   Packing materials:  None Post-procedure details:    Procedure completion:  Tolerated well, no immediate complications Pressure dressing applied post procedure.   Results for orders placed or performed in visit on 10/22/23  Urinalysis, Complete   Collection Time: 10/22/23  3:26 PM  Result Value Ref Range   Specific Gravity, UA 1.010 1.005 - 1.030   pH, UA 6.0 5.0 - 7.5   Color, UA Yellow Yellow   Appearance Ur Clear Clear   Leukocytes,UA Negative Negative   Protein,UA Negative Negative/Trace   Glucose, UA Negative Negative   Ketones, UA Negative Negative   RBC, UA Trace (A) Negative   Bilirubin, UA Negative Negative   Urobilinogen,  Ur 0.2 0.2 - 1.0 mg/dL   Nitrite, UA Negative Negative   Microscopic Examination CANCELED   BMP8+EGFR   Collection Time: 10/22/23  3:52 PM  Result Value Ref Range   Glucose 167 (H) 70 - 99 mg/dL   BUN 26 8 - 27 mg/dL   Creatinine, Ser 8.91 (H) 0.57 - 1.00 mg/dL   eGFR 51 (L) >40 fO/fpw/8.26   BUN/Creatinine Ratio 24 12 - 28   Sodium 140 134 - 144 mmol/L   Potassium 4.5 3.5 - 5.2 mmol/L   Chloride 98 96 - 106 mmol/L   CO2 21 20 - 29 mmol/L   Calcium  10.7 (H) 8.7 - 10.3 mg/dL   *Note: Due to a large number of results and/or encounters for the requested time period, some results have not been displayed. A complete set of results can be found in Results Review.       Pertinent labs & imaging results that were available during my care of the patient were reviewed by me and considered in my medical decision making.  Assessment & Plan:  Tala was seen today for laceration.  Diagnoses and all orders for this visit:  Hematoma -     I & D      Hematoma of the leg Hematoma on the leg due to trauma with a rocking chair, approximately two  weeks old. It is a large, non-infected collection of blood causing pressure and oozing. Incision and drainage are planned to relieve pressure and promote healing. Current antibiotic coverage with Keflex  is sufficient for potential infection risk post-procedure. - Perform incision and drainage of the hematoma. - Wrap the leg post-procedure and instruct dressing changes once daily. - Advise washing the area with soap and water before rewrapping. - Monitor for signs of infection, including increased redness or purulent drainage. - Continue Keflex  for a few more days to cover potential infection risk.  Urinary tract infection Currently on Keflex  for a urinary tract infection with three to four days remaining in the course. - Continue Keflex  for the remaining duration.          Continue all other maintenance medications.  Follow up plan: Return if symptoms worsen or fail to improve.   Continue healthy lifestyle choices, including diet (rich in fruits, vegetables, and lean proteins, and low in salt and simple carbohydrates) and exercise (at least 30 minutes of moderate physical activity daily).  Educational handout given for wound care  The above assessment and management plan was discussed with the patient. The patient verbalized understanding of and has agreed to the management plan. Patient is aware to call the clinic if they develop any new symptoms or if symptoms persist or worsen. Patient is aware when to return to the clinic for a follow-up visit. Patient educated on when it is appropriate to go to the emergency department.   Rosaline Bruns, FNP-C [facility] [phone]

## 2023-11-09 ENCOUNTER — Ambulatory Visit: Payer: Self-pay

## 2023-11-09 NOTE — Telephone Encounter (Signed)
 FYI Only or Action Required?: Action required by provider: request for appointment.  Patient was last seen in primary care on 11/05/2023 by Severa Rock HERO, FNP.  Called Nurse Triage reporting No chief complaint on file..  Symptoms began several days ago.  Interventions attempted: Nothing.  Symptoms are: unchanged.  Triage Disposition: See PCP When Office is Open (Within 3 Days)  Patient/caregiver understands and will follow disposition?: UnsureCopied from CRM [id number]. Topic: Clinical - Red Word Triage >> Nov 09, 2023  3:11 PM Montie POUR wrote: Red Word that prompted transfer to Nurse Triage:  She is having fluid coming out of her sign at the back of her knee. Not warm to touch. The place on her lower calf is getting better and she wants to see if how long to leave bandage. Reason for Disposition  Nursing judgment or information in reference  Answer Assessment - Initial Assessment Questions 1. REASON FOR CALL: What is your main concern right now?     Pt is seeping up from calf wound. Clear liquid coming out of back of leg.  On left leg. Drainage is clear. Not warm to touch. No smell.   Pt had hematoma removed on 7/17 in calf area of left leg. Wound is scabbing over and pt wants to know when to take bandage off.   Daughter Metta called. She said is insistent the drainage is not sweat but doen't feel she will come back to office for appt. RN made appt but [place] leaves out [place] and will need to see if family can coordinate a ride to office.  Protocols used: No Guideline Available-A-AH

## 2023-11-09 NOTE — Telephone Encounter (Signed)
 Noted. appt made.

## 2023-11-11 ENCOUNTER — Ambulatory Visit: Admitting: Nurse Practitioner

## 2023-11-11 ENCOUNTER — Encounter: Payer: Self-pay | Admitting: Family

## 2023-11-17 ENCOUNTER — Ambulatory Visit: Admitting: Urology

## 2023-11-17 ENCOUNTER — Encounter: Payer: Self-pay | Admitting: Urology

## 2023-11-17 ENCOUNTER — Ambulatory Visit (INDEPENDENT_AMBULATORY_CARE_PROVIDER_SITE_OTHER): Admitting: Urology

## 2023-11-17 VITALS — BP 134/66 | HR 83

## 2023-11-17 DIAGNOSIS — N390 Urinary tract infection, site not specified: Secondary | ICD-10-CM | POA: Diagnosis not present

## 2023-11-17 DIAGNOSIS — Z905 Acquired absence of kidney: Secondary | ICD-10-CM | POA: Diagnosis not present

## 2023-11-17 DIAGNOSIS — N952 Postmenopausal atrophic vaginitis: Secondary | ICD-10-CM | POA: Diagnosis not present

## 2023-11-17 DIAGNOSIS — N183 Chronic kidney disease, stage 3 unspecified: Secondary | ICD-10-CM

## 2023-11-17 DIAGNOSIS — Z87440 Personal history of urinary (tract) infections: Secondary | ICD-10-CM

## 2023-11-17 LAB — MICROSCOPIC EXAMINATION
Epithelial Cells (non renal): >10 /HPF — ABNORMAL HIGH (ref 0–10)
RBC, Urine: NONE SEEN /HPF (ref 0–2)

## 2023-11-17 LAB — BLADDER SCAN AMB NON-IMAGING: Scan Result: 22

## 2023-11-17 LAB — URINALYSIS, ROUTINE W REFLEX MICROSCOPIC
Bilirubin, UA: NEGATIVE
Glucose, UA: NEGATIVE
Ketones, UA: NEGATIVE
Leukocytes,UA: NEGATIVE
Nitrite, UA: NEGATIVE
RBC, UA: NEGATIVE
Specific Gravity, UA: 1.02 (ref 1.005–1.030)
Urobilinogen, Ur: 0.2 mg/dL (ref 0.2–1.0)
pH, UA: 6 (ref 5.0–7.5)

## 2023-11-17 NOTE — Progress Notes (Signed)
 Name: Lorraine Leonard DOB: 1940-08-02 MRN: [id number]  History of Present Illness: Lorraine Leonard is a 83 y.o. female who presents today for follow up visit at [facility] [place]. She is accompanied by her son Lorraine Leonard, who assists with providing history. Relevant History includes: 1. Solitary right kidney. - Left nephrectomy in 1983; 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. 2. CKD stage 3. GFR = 51 on 10/22/2023. 3. Recurrent UTI. - Her UTI risk factors include T2DM, vaginal atrophy, and use of immunosuppressant medication (Prednisone  for RA). 4. Vaginal atrophy.   Urine culture results in past 12 months: - 03/06/2023: Positive for Klebsiella pneumoniae - 05/22/2023: Positive for Enterococcus asburiae - 07/21/2023: Positive for Klebsiella pneumoniae - 09/10/2023: Positive for Klebsiella pneumoniae - 10/02/2023: Positive for Klebsiella pneumoniae  At last visit on 10/06/2023: - Reported increased urinary urgency, frequency, nocturia, dysuria, hesitancy, and sensations of incomplete emptying. Reports voiding 15x/day and 4x/night.  - Denied consistent use of topical vaginal estrogen cream. - The plan was: 1. Start Keflex  (Cephalexin ) 250 mg daily for UTI prophylaxis 2. Continue topical vaginal estrogen cream at least 2-3 nights per week routinely. 3. Maintain adequate fluid intake daily to flush out the urinary tract. 4. Urinate every 4-6 hours while awake to minimize urinary stasis / bacterial overgrowth in the bladder. 5. Consider OTC supplements for UTI prevention. 6. Return in about 6 weeks (around 11/17/2023) for Recurrent UTI, with UA & PVR.  Today: She reports 0 UTIs since last visit. She has been using vaginal estrogen cream at a frequency of 2-3 time(s) per week. She denies acute urinary symptoms at this time.    Medications: Current Outpatient Medications  Medication Sig Dispense Refill   albuterol  (VENTOLIN  HFA) 108 (90 Base) MCG/ACT inhaler INHALE 2 PUFFS INTO THE LUNGS EVERY  6 HOURS AS NEEDED FOR WHEEZE OR SHORTNESS OF BREATH 8.5 each 2   amLODipine  (NORVASC ) 5 MG tablet Take 1 tablet (5 mg total) by mouth daily. 90 tablet 2   atenolol  (TENORMIN ) 25 MG tablet TAKE 1 TABLET (25 MG TOTAL) BY MOUTH DAILY. 90 tablet 0   calcium  citrate (CALCITRATE - DOSED IN MG ELEMENTAL CALCIUM ) 950 MG tablet Take 1 tablet by mouth 2 (two) times daily.      cephALEXin  (KEFLEX ) 250 MG capsule Take 1 capsule (250 mg total) by mouth daily. 30 capsule 5   cyanocobalamin  1000 MCG tablet Take 1,000 mcg by mouth daily. Vitamin b12 every AM     diclofenac  Sodium (VOLTAREN ) 1 % GEL APPLY 4 GRAMS TOPICALLY 4 TIMES A DAY 400 g 1   ELIQUIS  5 MG TABS tablet TAKE 1 TABLET BY MOUTH TWICE A DAY 180 tablet 0   estradiol  (ESTRACE  VAGINAL) 0.1 MG/GM vaginal cream Place 1 Applicatorful vaginally at bedtime. 42.5 g 12   estradiol  (ESTRACE ) 0.1 MG/GM vaginal cream Place 1 g vaginally daily. 42.5 g 3   fluconazole  (DIFLUCAN ) 150 MG tablet Take 1 tablet (150 mg total) by mouth every three (3) days as needed. (Patient not taking: Reported on 11/05/2023) 3 tablet 0   fluticasone  (FLONASE ) 50 MCG/ACT nasal spray PLACE 1 SPRAY INTO BOTH NOSTRILS 2 (TWO) TIMES DAILY AS NEEDED FOR ALLERGIES OR RHINITIS. 48 mL 1   furosemide  (LASIX ) 40 MG tablet Take 1 tablet (40 mg total) by mouth daily. 90 tablet 1   Iron , Ferrous Sulfate , 325 (65 Fe) MG TABS Take 325 mg by mouth daily. 90 tablet 1   levocetirizine (XYZAL ) 5 MG tablet TAKE 1 TABLET BY MOUTH  EVERY DAY IN THE EVENING 90 tablet 1   levothyroxine  (SYNTHROID ) 100 MCG tablet TAKE 1 TABLET BY MOUTH EVERY DAY 90 tablet 0   lisinopril  (ZESTRIL ) 10 MG tablet TAKE 1 TABLET BY MOUTH EVERY DAY 90 tablet 1   metFORMIN  (GLUCOPHAGE -XR) 500 MG 24 hr tablet TAKE 1 TABLET BY MOUTH EVERY DAY WITH BREAKFAST 90 tablet 0   Multiple Vitamins-Minerals (PRESERVISION AREDS 2) CAPS Take 1 capsule by mouth 2 (two) times daily.     Olopatadine  HCl 0.2 % SOLN INSTILL 1 DROP INTO BOTH EYES EVERY  DAY 10 mL 5   omeprazole  (PRILOSEC) 20 MG capsule Take 1 capsule (20 mg total) by mouth daily. 90 capsule 2   ondansetron  (ZOFRAN ) 4 MG tablet Take 1 tablet (4 mg total) by mouth every 8 (eight) hours as needed for nausea or vomiting. 20 tablet 0   OXYGEN  Inhale 3 L into the lungs every evening.     Polyethyl Glycol-Propyl Glycol (SYSTANE) 0.4-0.3 % GEL ophthalmic gel Place 1 application into both eyes at bedtime as needed (dry eyes).     predniSONE  (DELTASONE ) 5 MG tablet TAKE 1 TABLET (5 MG TOTAL) BY MOUTH 2 (TWO) TIMES DAILY WITH A MEAL. (Patient not taking: Reported on 11/05/2023) 180 tablet 1   rosuvastatin  (CRESTOR ) 10 MG tablet TAKE 1 TABLET BY MOUTH EVERYDAY AT BEDTIME 90 tablet 0   vitamin C  (ASCORBIC ACID ) 500 MG tablet Take 1,000 mg by mouth daily. AM     Vitamin D , Ergocalciferol , (DRISDOL ) 1.25 MG (50000 UNIT) CAPS capsule TAKE 1 CAPSULE (50,000 UNITS TOTAL) BY MOUTH EVERY 7 (SEVEN) DAYS 12 capsule 3   No current facility-administered medications for this visit.    Allergies: Allergies  Allergen Reactions   Allopurinol  Swelling    Feet and legs swell   Mobic  [Meloxicam ] Swelling and Other (See Comments)    Feet and Leg swelling   Macrodantin  [Nitrofurantoin  Macrocrystal]     Past Medical History:  Diagnosis Date   Allergic rhinitis    Anxiety    Anxiety    Arthritis    ~ all my joints (05/18/2017)   Atrial fibrillation (HCC) 09/2016   Atrial fibrillation with RVR (HCC) 05/18/2017   Chronic heart failure with preserved ejection fraction (HFpEF) (HCC)    Chronic kidney disease, stage 3a (HCC)    Chronic lower back pain    COPD (chronic obstructive pulmonary disease) (HCC)    Depression    Diverticulosis    GERD (gastroesophageal reflux disease)    Glaucoma    Gout    History of blood transfusion 1983   when I gave a 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   Hyperlipidemia    Hypertension    Hypothyroidism    Macular degeneration of both eyes    Neuropathy    On home oxygen   therapy    3L; 24/7 (05/18/2017)   Pre-diabetes    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   Pulmonary fibrosis (HCC)    Rheumatoid arthritis (HCC)    Sleep apnea    uses 3 liters of o2 24/7   Urticaria    Past Surgical History:  Procedure Laterality Date   AV NODE ABLATION  05/18/2017   AV NODE ABLATION N/A 05/18/2017   Procedure: AV NODE ABLATION;  Surgeon: Waddell Danelle ORN, MD;  Location: [facility];  Service: Cardiovascular;  Laterality: N/A;   BACK SURGERY     BREAST LUMPECTOMY Left    CARDIOVERSION N/A 01/30/2017   Procedure: CARDIOVERSION;  Surgeon: Delford Maude BROCKS, MD;  Location: [facility];  Service: Cardiovascular;  Laterality: N/A;   CARDIOVERSION N/A 04/09/2017   Procedure: CARDIOVERSION;  Surgeon: Debera Jayson MATSU, MD;  Location: [facility];  Service: Cardiovascular;  Laterality: N/A;   CATARACT EXTRACTION W/ INTRAOCULAR LENS  IMPLANT, BILATERAL Bilateral    DILATION AND CURETTAGE OF UTERUS     EUS N/A 10/03/2015   Procedure: UPPER ENDOSCOPIC ULTRASOUND (EUS) RADIAL;  Surgeon: Elsie Cree, MD;  Location: [facility];  Service: Endoscopy;  Laterality: N/A;   FINE NEEDLE ASPIRATION N/A 10/03/2015   Procedure: FINE NEEDLE ASPIRATION (FNA) RADIAL;  Surgeon: Elsie Cree, MD;  Location: [facility];  Service: Endoscopy;  Laterality: N/A;   INSERT / REPLACE / REMOVE PACEMAKER  05/18/2017   LUMBAR LAMINECTOMY  1995; 06/23/2008   L4-5/notes 08/20/2010   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   NEPHRECTOMY TRANSPLANTED ORGAN     PACEMAKER IMPLANT N/A 05/18/2017   Procedure: PACEMAKER IMPLANT;  Surgeon: Waddell Danelle ORN, MD;  Location: [facility];  Service: Cardiovascular;  Laterality: N/A;   TOTAL ABDOMINAL HYSTERECTOMY     TUBAL LIGATION     Family History  Problem Relation Age of Onset   Diabetes Other    Hypertension Other    Coronary artery disease Other    Stroke Other    Asthma Other    Kidney disease  Sister    Cancer Sister        lung   Lung cancer Sister    Cancer Sister        lung   Lung cancer Sister    Cancer Sister        lung   Suicidality Son        committed suicide in 2009   CVA Son    Hyperlipidemia Other    Anxiety disorder Other    Migraines Other    Heart failure Father    Healthy Brother    Cancer Sister        lung   Healthy Sister    Social History   Socioeconomic History   Marital status: Widowed    Spouse name: Janyia Guion   Number of children: 4   Years of education: 9   Highest education level: 9th grade  Occupational History   Occupation: retired    Comment: cna  Tobacco Use   Smoking status: Never    Passive exposure: Never   Smokeless tobacco: Never  Vaping Use   Vaping status: Never Used  Substance and Sexual Activity   Alcohol  use: No   Drug use: No   Sexual activity: Not Currently    Partners: Male    Birth control/protection: Surgical  Other Topics Concern   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   Pt lives alone in single wide trailer. They have 4 children and 13 grandchildren.    Social Drivers of Corporate investment banker Strain: Low Risk  (03/28/2022)   Overall Financial Resource Strain (CARDIA)    Difficulty of Paying Living Expenses: Not hard at all  Food Insecurity: No Food Insecurity (03/28/2022)   Hunger Vital Sign    Worried About Running Out of Food in the Last Year: Never true    Ran Out of Food in the Last Year: Never true  Transportation Needs: No Transportation Needs (03/28/2022)  PRAPARE - Administrator, Civil Service (Medical): No    Lack of Transportation (Non-Medical): No  Physical Activity: Insufficiently Active (03/28/2022)   Exercise Vital Sign    Days of Exercise per Week: 3 days    Minutes of Exercise per Session: 30 min  Stress: No Stress Concern Present (03/28/2022)   [employer] of Occupational Health -  Occupational Stress Questionnaire    Feeling of Stress : Not at all  Social Connections: Moderately Isolated (03/28/2022)   Social Connection and Isolation Panel    Frequency of Communication with Friends and Family: More than three times a week    Frequency of Social Gatherings with Friends and Family: More than three times a week    Attends Religious Services: 1 to 4 times per year    Active Member of [employer] or Organizations: No    Attends Banker Meetings: Never    Marital Status: Widowed  Intimate Partner Violence: Not At Risk (03/28/2022)   Humiliation, Afraid, Rape, and Kick questionnaire    Fear of Current or Ex-Partner: No    Emotionally Abused: No    Physically Abused: No    Sexually Abused: No    Review of Systems Constitutional: Patient denies any unintentional weight loss or change in strength lntegumentary: Patient denies any rashes or pruritus Cardiovascular: Patient denies chest pain or syncope Respiratory: Patient denies shortness of breath Gastrointestinal: Patient denies nausea, vomiting, constipation, or diarrhea  Musculoskeletal: Patient denies muscle cramps or weakness Neurologic: Patient denies convulsions or seizures Allergic/Immunologic: Patient denies recent allergic reaction(s) Hematologic/Lymphatic: Patient denies bleeding tendencies Endocrine: Patient denies heat/cold intolerance  GU: As per HPI.  OBJECTIVE Vitals:   11/17/23 1443  BP: 134/66  Pulse: 83   There is no height or weight on file to calculate BMI.  Physical Examination Constitutional: No obvious distress; patient is non-toxic appearing  Cardiovascular: No visible lower extremity edema.  Respiratory: The patient does not have audible wheezing/stridor; respirations do not appear labored  Gastrointestinal: Abdomen non-distended Musculoskeletal: Normal ROM of UEs  Skin: No obvious rashes/open sores  Neurologic: CN 2-12 grossly intact Psychiatric: Pleasant affect,  questionable historian Hematologic/Lymphatic/Immunologic: No obvious bruises or sites of spontaneous bleeding  UA/microscopy: protein 2+, few bacteria,otherwise unremarkable PVR: 22 ml  ASSESSMENT Recurrent UTI - Plan: BLADDER SCAN AMB NON-IMAGING, Urinalysis, Routine w reflex microscopic  Atrophic vaginitis  Solitary kidney, acquired  Stage 3 chronic kidney disease, unspecified whether stage 3a or 3b CKD (HCC)  No acute concerns or findings. We discussed potential risks / benefits of ongoing daily low dose antibiotic use for UTI prevention; she and her son elected to continue Keflex  (Cephalexin ) 250 mg daily for UTI prophylaxis along with topical vaginal estrogen cream 2-3x/week. We agreed to plan for follow up in 6 months or sooner if needed. Patient verbalized understanding of and agreement with current plan. All questions were answered.  PLAN Advised the following: 1. Continue Keflex  (Cephalexin ) 250 mg daily. 2. Continue topical vaginal estrogen cream 2-3x/week.  3. Return in about 6 months (around 05/19/2024) for Recurrent UTI, with UA & PVR.  Orders Placed This Encounter  Procedures   Urinalysis, Routine w reflex microscopic   BLADDER SCAN AMB NON-IMAGING    It has been explained that the patient is to follow regularly with their PCP in addition to all other providers involved in their care and to follow instructions provided by these respective offices. Patient advised to contact urology clinic if any urologic-pertaining questions, concerns, new symptoms  or problems arise in the interim period.  There are no Patient Instructions on file for this visit.  Electronically signed by:  Lauraine JAYSON Oz, FNP   11/17/23    3:14 PM

## 2023-11-17 NOTE — Progress Notes (Signed)
 Remote pacemaker transmission.

## 2023-11-17 NOTE — Progress Notes (Signed)
 Bladder Scan completed today.  Patient can void prior to the bladder scan. Bladder scan result: 22  Performed By: [facility] LPN

## 2023-11-24 ENCOUNTER — Encounter: Payer: Self-pay | Admitting: Nurse Practitioner

## 2023-11-24 ENCOUNTER — Ambulatory Visit: Payer: Self-pay

## 2023-11-24 ENCOUNTER — Inpatient Hospital Stay (HOSPITAL_COMMUNITY)
Admission: EM | Admit: 2023-11-24 | Discharge: 2023-11-26 | DRG: 872 | Disposition: A | Discharge status: Home / Self Care | Attending: Family Medicine | Admitting: Family Medicine

## 2023-11-24 ENCOUNTER — Ambulatory Visit (INDEPENDENT_AMBULATORY_CARE_PROVIDER_SITE_OTHER): Admitting: Nurse Practitioner

## 2023-11-24 ENCOUNTER — Other Ambulatory Visit: Payer: Self-pay

## 2023-11-24 ENCOUNTER — Emergency Department (HOSPITAL_COMMUNITY)

## 2023-11-24 ENCOUNTER — Ambulatory Visit

## 2023-11-24 ENCOUNTER — Encounter (HOSPITAL_COMMUNITY): Payer: Self-pay

## 2023-11-24 VITALS — BP 128/65 | HR 73 | Temp 97.8°F | Ht 67.0 in | Wt 187.8 lb

## 2023-11-24 DIAGNOSIS — E1159 Type 2 diabetes mellitus with other circulatory complications: Secondary | ICD-10-CM | POA: Diagnosis present

## 2023-11-24 DIAGNOSIS — E1122 Type 2 diabetes mellitus with diabetic chronic kidney disease: Secondary | ICD-10-CM | POA: Diagnosis present

## 2023-11-24 DIAGNOSIS — Z7901 Long term (current) use of anticoagulants: Secondary | ICD-10-CM

## 2023-11-24 DIAGNOSIS — E039 Hypothyroidism, unspecified: Secondary | ICD-10-CM | POA: Diagnosis present

## 2023-11-24 DIAGNOSIS — I509 Heart failure, unspecified: Secondary | ICD-10-CM | POA: Diagnosis not present

## 2023-11-24 DIAGNOSIS — Z79890 Hormone replacement therapy (postmenopausal): Secondary | ICD-10-CM | POA: Diagnosis not present

## 2023-11-24 DIAGNOSIS — Z9981 Dependence on supplemental oxygen: Secondary | ICD-10-CM

## 2023-11-24 DIAGNOSIS — M069 Rheumatoid arthritis, unspecified: Secondary | ICD-10-CM | POA: Diagnosis present

## 2023-11-24 DIAGNOSIS — Z888 Allergy status to other drugs, medicaments and biological substances status: Secondary | ICD-10-CM

## 2023-11-24 DIAGNOSIS — Z79899 Other long term (current) drug therapy: Secondary | ICD-10-CM | POA: Diagnosis not present

## 2023-11-24 DIAGNOSIS — J9611 Chronic respiratory failure with hypoxia: Secondary | ICD-10-CM | POA: Diagnosis present

## 2023-11-24 DIAGNOSIS — N390 Urinary tract infection, site not specified: Secondary | ICD-10-CM | POA: Diagnosis present

## 2023-11-24 DIAGNOSIS — E872 Acidosis, unspecified: Secondary | ICD-10-CM | POA: Diagnosis present

## 2023-11-24 DIAGNOSIS — I272 Pulmonary hypertension, unspecified: Secondary | ICD-10-CM | POA: Diagnosis present

## 2023-11-24 DIAGNOSIS — Z950 Presence of cardiac pacemaker: Secondary | ICD-10-CM

## 2023-11-24 DIAGNOSIS — Z961 Presence of intraocular lens: Secondary | ICD-10-CM | POA: Diagnosis present

## 2023-11-24 DIAGNOSIS — I5032 Chronic diastolic (congestive) heart failure: Secondary | ICD-10-CM | POA: Diagnosis present

## 2023-11-24 DIAGNOSIS — Z841 Family history of disorders of kidney and ureter: Secondary | ICD-10-CM

## 2023-11-24 DIAGNOSIS — J8410 Pulmonary fibrosis, unspecified: Secondary | ICD-10-CM | POA: Diagnosis not present

## 2023-11-24 DIAGNOSIS — I4891 Unspecified atrial fibrillation: Secondary | ICD-10-CM | POA: Diagnosis present

## 2023-11-24 DIAGNOSIS — A419 Sepsis, unspecified organism: Principal | ICD-10-CM | POA: Diagnosis present

## 2023-11-24 DIAGNOSIS — L97229 Non-pressure chronic ulcer of left calf with unspecified severity: Secondary | ICD-10-CM | POA: Diagnosis present

## 2023-11-24 DIAGNOSIS — Z823 Family history of stroke: Secondary | ICD-10-CM

## 2023-11-24 DIAGNOSIS — E11622 Type 2 diabetes mellitus with other skin ulcer: Secondary | ICD-10-CM | POA: Diagnosis present

## 2023-11-24 DIAGNOSIS — N1831 Chronic kidney disease, stage 3a: Secondary | ICD-10-CM | POA: Diagnosis present

## 2023-11-24 DIAGNOSIS — Z9841 Cataract extraction status, right eye: Secondary | ICD-10-CM

## 2023-11-24 DIAGNOSIS — L03116 Cellulitis of left lower limb: Secondary | ICD-10-CM | POA: Diagnosis present

## 2023-11-24 DIAGNOSIS — I10 Essential (primary) hypertension: Secondary | ICD-10-CM | POA: Diagnosis present

## 2023-11-24 DIAGNOSIS — Z7984 Long term (current) use of oral hypoglycemic drugs: Secondary | ICD-10-CM

## 2023-11-24 DIAGNOSIS — S81802A Unspecified open wound, left lower leg, initial encounter: Secondary | ICD-10-CM | POA: Insufficient documentation

## 2023-11-24 DIAGNOSIS — E785 Hyperlipidemia, unspecified: Secondary | ICD-10-CM | POA: Diagnosis present

## 2023-11-24 DIAGNOSIS — Z789 Other specified health status: Secondary | ICD-10-CM

## 2023-11-24 DIAGNOSIS — R601 Generalized edema: Secondary | ICD-10-CM | POA: Diagnosis not present

## 2023-11-24 DIAGNOSIS — G4733 Obstructive sleep apnea (adult) (pediatric): Secondary | ICD-10-CM | POA: Diagnosis present

## 2023-11-24 DIAGNOSIS — J449 Chronic obstructive pulmonary disease, unspecified: Secondary | ICD-10-CM | POA: Diagnosis not present

## 2023-11-24 DIAGNOSIS — Z833 Family history of diabetes mellitus: Secondary | ICD-10-CM

## 2023-11-24 DIAGNOSIS — I152 Hypertension secondary to endocrine disorders: Secondary | ICD-10-CM | POA: Diagnosis present

## 2023-11-24 DIAGNOSIS — Z801 Family history of malignant neoplasm of trachea, bronchus and lung: Secondary | ICD-10-CM

## 2023-11-24 DIAGNOSIS — E1142 Type 2 diabetes mellitus with diabetic polyneuropathy: Secondary | ICD-10-CM | POA: Diagnosis not present

## 2023-11-24 DIAGNOSIS — K219 Gastro-esophageal reflux disease without esophagitis: Secondary | ICD-10-CM | POA: Diagnosis present

## 2023-11-24 DIAGNOSIS — L089 Local infection of the skin and subcutaneous tissue, unspecified: Principal | ICD-10-CM

## 2023-11-24 DIAGNOSIS — F419 Anxiety disorder, unspecified: Secondary | ICD-10-CM | POA: Diagnosis present

## 2023-11-24 DIAGNOSIS — S81809A Unspecified open wound, unspecified lower leg, initial encounter: Secondary | ICD-10-CM | POA: Diagnosis not present

## 2023-11-24 DIAGNOSIS — G8929 Other chronic pain: Secondary | ICD-10-CM | POA: Diagnosis present

## 2023-11-24 DIAGNOSIS — Z825 Family history of asthma and other chronic lower respiratory diseases: Secondary | ICD-10-CM

## 2023-11-24 DIAGNOSIS — Z9842 Cataract extraction status, left eye: Secondary | ICD-10-CM

## 2023-11-24 DIAGNOSIS — Z8249 Family history of ischemic heart disease and other diseases of the circulatory system: Secondary | ICD-10-CM

## 2023-11-24 DIAGNOSIS — Z905 Acquired absence of kidney: Secondary | ICD-10-CM

## 2023-11-24 DIAGNOSIS — T148XXA Other injury of unspecified body region, initial encounter: Secondary | ICD-10-CM | POA: Diagnosis present

## 2023-11-24 DIAGNOSIS — Z90710 Acquired absence of both cervix and uterus: Secondary | ICD-10-CM

## 2023-11-24 DIAGNOSIS — N183 Chronic kidney disease, stage 3 unspecified: Secondary | ICD-10-CM | POA: Diagnosis not present

## 2023-11-24 DIAGNOSIS — W228XXA Striking against or struck by other objects, initial encounter: Secondary | ICD-10-CM | POA: Diagnosis present

## 2023-11-24 DIAGNOSIS — I4821 Permanent atrial fibrillation: Secondary | ICD-10-CM | POA: Diagnosis present

## 2023-11-24 DIAGNOSIS — M7989 Other specified soft tissue disorders: Secondary | ICD-10-CM | POA: Diagnosis present

## 2023-11-24 DIAGNOSIS — E119 Type 2 diabetes mellitus without complications: Secondary | ICD-10-CM

## 2023-11-24 LAB — CBC WITH DIFFERENTIAL/PLATELET
Abs Immature Granulocytes: 0.08 K/uL — ABNORMAL HIGH (ref 0.00–0.07)
Basophils Absolute: 0 K/uL (ref 0.0–0.1)
Basophils Relative: 0 %
Eosinophils Absolute: 0 K/uL (ref 0.0–0.5)
Eosinophils Relative: 0 %
HCT: 39.1 % (ref 36.0–46.0)
Hemoglobin: 11.7 g/dL — ABNORMAL LOW (ref 12.0–15.0)
Immature Granulocytes: 1 %
Lymphocytes Relative: 11 %
Lymphs Abs: 1.2 K/uL (ref 0.7–4.0)
MCH: 25.2 pg — ABNORMAL LOW (ref 26.0–34.0)
MCHC: 29.9 g/dL — ABNORMAL LOW (ref 30.0–36.0)
MCV: 84.1 fL (ref 80.0–100.0)
Monocytes Absolute: 0.9 K/uL (ref 0.1–1.0)
Monocytes Relative: 8 %
Neutro Abs: 8.8 K/uL — ABNORMAL HIGH (ref 1.7–7.7)
Neutrophils Relative %: 80 %
Platelets: 340 K/uL (ref 150–400)
RBC: 4.65 MIL/uL (ref 3.87–5.11)
RDW: 20.6 % — ABNORMAL HIGH (ref 11.5–15.5)
WBC: 11 K/uL — ABNORMAL HIGH (ref 4.0–10.5)
nRBC: 0 % (ref 0.0–0.2)

## 2023-11-24 LAB — COMPREHENSIVE METABOLIC PANEL WITH GFR
ALT: 22 U/L (ref 0–44)
AST: 25 U/L (ref 15–41)
Albumin: 3.9 g/dL (ref 3.5–5.0)
Alkaline Phosphatase: 82 U/L (ref 38–126)
Anion gap: 15 (ref 5–15)
BUN: 24 mg/dL — ABNORMAL HIGH (ref 8–23)
CO2: 25 mmol/L (ref 22–32)
Calcium: 10 mg/dL (ref 8.9–10.3)
Chloride: 99 mmol/L (ref 98–111)
Creatinine, Ser: 1.18 mg/dL — ABNORMAL HIGH (ref 0.44–1.00)
GFR, Estimated: 46 mL/min — ABNORMAL LOW (ref >60)
Glucose, Bld: 182 mg/dL — ABNORMAL HIGH (ref 70–99)
Potassium: 4.8 mmol/L (ref 3.5–5.1)
Sodium: 139 mmol/L (ref 135–145)
Total Bilirubin: 1.1 mg/dL (ref 0.0–1.2)
Total Protein: 7.5 g/dL (ref 6.5–8.1)

## 2023-11-24 LAB — LACTIC ACID, PLASMA
Lactic Acid, Venous: 3.9 mmol/L (ref 0.5–1.9)
Lactic Acid, Venous: 3.2 mmol/L (ref 0.5–1.9)
Lactic Acid, Venous: 2.9 mmol/L (ref 0.5–1.9)

## 2023-11-24 LAB — GLUCOSE, CAPILLARY: Glucose-Capillary: 139 mg/dL — ABNORMAL HIGH (ref 70–99)

## 2023-11-24 MED ORDER — VANCOMYCIN HCL 500 MG/100ML IV SOLN
500.0000 mg | Freq: Two times a day (BID) | INTRAVENOUS | Status: DC
  Administered 2023-11-25 – 2023-11-26 (×3): 500 mg via INTRAVENOUS
  Filled 2023-11-24 (×5): qty 100

## 2023-11-24 MED ORDER — LACTATED RINGERS IV BOLUS
1000.0000 mL | Freq: Once | INTRAVENOUS | Status: AC
  Administered 2023-11-24: 1000 mL via INTRAVENOUS

## 2023-11-24 MED ORDER — ACETAMINOPHEN 325 MG PO TABS: 650.0000 mg | ORAL_TABLET | Freq: Four times a day (QID) | ORAL | Status: DC | PRN

## 2023-11-24 MED ORDER — SODIUM CHLORIDE 0.9 % IV SOLN
1.0000 g | Freq: Once | INTRAVENOUS | Status: AC
  Administered 2023-11-24: 1 g via INTRAVENOUS
  Filled 2023-11-24: qty 10

## 2023-11-24 MED ORDER — PANTOPRAZOLE SODIUM 40 MG PO TBEC
40.0000 mg | DELAYED_RELEASE_TABLET | Freq: Every day | ORAL | Status: DC
  Administered 2023-11-25 – 2023-11-26 (×2): 40 mg via ORAL
  Filled 2023-11-24 (×2): qty 1

## 2023-11-24 MED ORDER — VANCOMYCIN HCL IN DEXTROSE 1-5 GM/200ML-% IV SOLN
1000.0000 mg | Freq: Once | INTRAVENOUS | Status: AC
  Administered 2023-11-24: 1000 mg via INTRAVENOUS
  Filled 2023-11-24: qty 200

## 2023-11-24 MED ORDER — POLYETHYLENE GLYCOL 3350 17 G PO PACK
17.0000 g | PACK | Freq: Every day | ORAL | Status: DC | PRN
Start: 2023-11-24 — End: 2023-11-26

## 2023-11-24 MED ORDER — PREDNISONE 5 MG PO TABS
5.0000 mg | ORAL_TABLET | Freq: Two times a day (BID) | ORAL | Status: DC
  Administered 2023-11-25 – 2023-11-26 (×3): 5 mg via ORAL
  Filled 2023-11-24 (×3): qty 1

## 2023-11-24 MED ORDER — AMLODIPINE BESYLATE 5 MG PO TABS
5.0000 mg | ORAL_TABLET | Freq: Every day | ORAL | Status: DC
  Administered 2023-11-25 – 2023-11-26 (×2): 5 mg via ORAL
  Filled 2023-11-24 (×2): qty 1

## 2023-11-24 MED ORDER — ATENOLOL 25 MG PO TABS
25.0000 mg | ORAL_TABLET | Freq: Every day | ORAL | Status: DC
  Administered 2023-11-25 – 2023-11-26 (×2): 25 mg via ORAL
  Filled 2023-11-24 (×2): qty 1

## 2023-11-24 MED ORDER — POVIDONE-IODINE 10 % EX SOLN
CUTANEOUS | Status: AC
  Filled 2023-11-24: qty 14.8

## 2023-11-24 MED ORDER — INSULIN ASPART 100 UNIT/ML IJ SOLN
0.0000 [IU] | Freq: Three times a day (TID) | INTRAMUSCULAR | Status: DC
  Administered 2023-11-25: 1 [IU] via SUBCUTANEOUS
  Administered 2023-11-25 (×2): 2 [IU] via SUBCUTANEOUS
  Administered 2023-11-26: 1 [IU] via SUBCUTANEOUS

## 2023-11-24 MED ORDER — ACETAMINOPHEN 650 MG RE SUPP: 650.0000 mg | Freq: Four times a day (QID) | RECTAL | Status: DC | PRN

## 2023-11-24 MED ORDER — LACTATED RINGERS IV BOLUS
500.0000 mL | Freq: Once | INTRAVENOUS | Status: AC
  Administered 2023-11-24: 500 mL via INTRAVENOUS

## 2023-11-24 MED ORDER — ONDANSETRON HCL 4 MG PO TABS: 4.0000 mg | ORAL_TABLET | Freq: Four times a day (QID) | ORAL | Status: DC | PRN

## 2023-11-24 MED ORDER — APIXABAN 5 MG PO TABS
5.0000 mg | ORAL_TABLET | Freq: Two times a day (BID) | ORAL | Status: DC
  Administered 2023-11-24 – 2023-11-26 (×4): 5 mg via ORAL
  Filled 2023-11-24 (×4): qty 1

## 2023-11-24 MED ORDER — SODIUM CHLORIDE 0.9 % IV SOLN
1.0000 g | INTRAVENOUS | Status: DC
  Administered 2023-11-25: 1 g via INTRAVENOUS
  Filled 2023-11-24: qty 10

## 2023-11-24 MED ORDER — ONDANSETRON HCL 4 MG/2ML IJ SOLN: 4.0000 mg | Freq: Four times a day (QID) | INTRAMUSCULAR | Status: DC | PRN

## 2023-11-24 MED ORDER — LEVOTHYROXINE SODIUM 100 MCG PO TABS
100.0000 ug | ORAL_TABLET | Freq: Every day | ORAL | Status: DC
  Administered 2023-11-25 – 2023-11-26 (×2): 100 ug via ORAL
  Filled 2023-11-24 (×2): qty 1

## 2023-11-24 MED ORDER — INSULIN ASPART 100 UNIT/ML IJ SOLN: 0.0000 [IU] | Freq: Every day | INTRAMUSCULAR | Status: DC

## 2023-11-24 NOTE — H&P (Signed)
 History and Physical    Lorraine Leonard FMW:[id number] DOB: Mar 18, 1941 DOA: 11/24/2023  PCP: Lavell Bari LABOR, FNP   Patient coming from: Home  I have personally briefly reviewed patient's old medical records in [facility]  Chief Complaint: Left leg ulcer  HPI: Lorraine Leonard is a 83 y.o. female with medical history significant for diastolic CHF, atrial fibrillation, CKD 3, COPD, pulmonary hypertension/fibrosis, diabetes mellitus, OSA, chronic respiratory failure. Patient presented to the ED with complaints of worsening ulcer to the left lower extremity.  With worsening pain, redness and swelling.  She traumatized her left leg about a month ago on a piece of furniture.  6/17 she was started on a course of Keflex -for UTI.  She reports ongoing pain with urination she is unable to tell me if this is new or persistent since treatment of UTI. She reports chronic intermittent difficulties breathing that is unchanged.  No chest pain.  She reports chronic bilateral lower extremity swelling that is worse than baseline.  ED Course: Temperature 98.6.  Heart rate 61-108.  Respirate rate 16-22.  Blood pressure systolic 129-147.  O2 sat greater than 92% on room air. WBC 11.  Lactic acidosis 3.9 > 3.2.  X-ray left tib-fib-generalized subcutaneous edema, Soft tissue defect posteriorly in the calf. No radiopaque foreign body or tracking soft tissue gas.  IV ceftriaxone  and vancomycin  given.  1 L bolus given.  Review of Systems: As per HPI all other systems reviewed and negative.  Past Medical History:  Diagnosis Date   Allergic rhinitis    Anxiety    Anxiety    Arthritis    ~ all my joints (05/18/2017)   Atrial fibrillation (HCC) 09/2016   Atrial fibrillation with RVR (HCC) 05/18/2017   Chronic heart failure with preserved ejection fraction (HFpEF) (HCC)    Chronic kidney disease, stage 3a (HCC)    Chronic lower back pain    COPD (chronic obstructive pulmonary disease) (HCC)    Depression     Diverticulosis    GERD (gastroesophageal reflux disease)    Glaucoma    Gout    History of blood transfusion 1983   when I gave a 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   Hyperlipidemia    Hypertension    Hypothyroidism    Macular degeneration of both eyes    Neuropathy    On home oxygen  therapy    3L; 24/7 (05/18/2017)   Pre-diabetes    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   Pulmonary fibrosis (HCC)    Rheumatoid arthritis (HCC)    Sleep apnea    uses 3 liters of o2 24/7   Urticaria     Past Surgical History:  Procedure Laterality Date   AV NODE ABLATION  05/18/2017   AV NODE ABLATION N/A 05/18/2017   Procedure: AV NODE ABLATION;  Surgeon: Waddell Danelle ORN, MD;  [facility];  Service: Cardiovascular;  Laterality: N/A;   BACK SURGERY     BREAST LUMPECTOMY Left    CARDIOVERSION N/A 01/30/2017   Procedure: CARDIOVERSION;  Surgeon: Delford Maude BROCKS, MD;  Location: [facility];  Service: Cardiovascular;  Laterality: N/A;   CARDIOVERSION N/A 04/09/2017   Procedure: CARDIOVERSION;  Surgeon: Debera Jayson MATSU, MD;  Location: [facility];  Service: Cardiovascular;  Laterality: N/A;   CATARACT EXTRACTION W/ INTRAOCULAR LENS  IMPLANT, BILATERAL Bilateral    DILATION AND CURETTAGE OF UTERUS     EUS N/A 10/03/2015   Procedure: UPPER ENDOSCOPIC ULTRASOUND (EUS) RADIAL;  Surgeon: Elsie Cree, MD;  Location: [facility];  Service: Endoscopy;  Laterality: N/A;   FINE NEEDLE ASPIRATION N/A 10/03/2015   Procedure: FINE NEEDLE ASPIRATION (FNA) RADIAL;  Surgeon: Elsie Cree, MD;  Location: [facility];  Service: Endoscopy;  Laterality: N/A;   INSERT / REPLACE / REMOVE PACEMAKER  05/18/2017   LUMBAR LAMINECTOMY  1995; 06/23/2008   L4-5/notes 08/20/2010   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   NEPHRECTOMY TRANSPLANTED ORGAN     PACEMAKER IMPLANT N/A 05/18/2017   Procedure: PACEMAKER IMPLANT;  Surgeon: Waddell Danelle ORN, MD;  Location: [facility];  Service: Cardiovascular;  Laterality: N/A;   TOTAL ABDOMINAL HYSTERECTOMY     TUBAL LIGATION       reports that she has never smoked. She has never been exposed to tobacco smoke. She has never used smokeless tobacco. She reports that she does not drink alcohol  and does not use drugs.  Allergies  Allergen Reactions   Macrodantin  [Nitrofurantoin  Macrocrystal] Other (See Comments)    Unknown    Mobic  [Meloxicam ] Swelling and Other (See Comments)    Feet and legs swell   Allopurinol  Swelling    Feet and legs swell    Family History  Problem Relation Age of Onset   Diabetes Other    Hypertension Other    Coronary artery disease Other    Stroke Other    Asthma Other    Kidney disease Sister    Cancer Sister        lung   Lung cancer Sister    Cancer Sister        lung   Lung cancer Sister    Cancer Sister        lung   Suicidality Son        committed suicide in 2009   CVA Son    Hyperlipidemia Other    Anxiety disorder Other    Migraines Other    Heart failure Father    Healthy Brother    Cancer Sister        lung   Healthy Sister     Prior to Admission medications   Medication Sig Start Date End Date Taking? Authorizing Provider  albuterol  (VENTOLIN  HFA) 108 (90 Base) MCG/ACT inhaler INHALE 2 PUFFS INTO THE LUNGS EVERY 6 HOURS AS NEEDED FOR WHEEZE OR SHORTNESS OF BREATH 11/02/23  Yes Hawks, Christy A, FNP  amLODipine  (NORVASC ) 5 MG tablet Take 1 tablet (5 mg total) by mouth daily. 09/18/23 09/17/24 Yes Hawks, Christy A, FNP  atenolol  (TENORMIN ) 25 MG tablet TAKE 1 TABLET (25 MG TOTAL) BY MOUTH DAILY. 11/05/23  Yes Hawks, Christy A, FNP  calcium  citrate (CALCITRATE - DOSED IN MG ELEMENTAL CALCIUM ) 950 MG tablet Take 1 tablet by mouth daily.   Yes [provider]  cephALEXin  (KEFLEX ) 250 MG capsule Take 1 capsule (250 mg total) by mouth daily. Patient taking differently: Take 250 mg by mouth at bedtime. 10/06/23  Yes Gerldine Lauraine BROCKS, FNP  cyanocobalamin  1000  MCG tablet Take 1,000 mcg by mouth daily. Vitamin b12 every AM   Yes [provider]  ELIQUIS  5 MG TABS tablet TAKE 1 TABLET BY MOUTH TWICE A DAY 10/12/23  Yes Hawks, Christy A, FNP  fluticasone  (FLONASE ) 50 MCG/ACT nasal spray PLACE 1 SPRAY INTO BOTH NOSTRILS 2 (TWO) TIMES DAILY AS NEEDED FOR ALLERGIES OR RHINITIS. Patient taking differently: Place 1 spray into both nostrils in the morning and at bedtime.  09/16/23  Yes Hawks, Christy A, FNP  furosemide  (LASIX ) 40 MG tablet Take 1 tablet (40 mg total) by mouth daily. 10/22/23  Yes Hawks, Christy A, FNP  Iron , Ferrous Sulfate , 325 (65 Fe) MG TABS Take 325 mg by mouth daily. 09/17/23  Yes Hawks, Christy A, FNP  levocetirizine (XYZAL ) 5 MG tablet TAKE 1 TABLET BY MOUTH EVERY DAY IN THE EVENING Patient taking differently: Take 5 mg by mouth every evening. 08/25/23  Yes Hawks, Christy A, FNP  levothyroxine  (SYNTHROID ) 100 MCG tablet TAKE 1 TABLET BY MOUTH EVERY DAY 11/05/23  Yes Hawks, Christy A, FNP  metFORMIN  (GLUCOPHAGE -XR) 500 MG 24 hr tablet TAKE 1 TABLET BY MOUTH EVERY DAY WITH BREAKFAST 11/05/23  Yes Hawks, Christy A, FNP  Multiple Vitamins-Minerals (PRESERVISION AREDS 2) CAPS Take 1 capsule by mouth 2 (two) times daily.   Yes [provider]  Olopatadine  HCl 0.2 % SOLN INSTILL 1 DROP INTO BOTH EYES EVERY DAY 04/30/20  Yes Hawks, Christy A, FNP  omeprazole  (PRILOSEC) 20 MG capsule Take 1 capsule (20 mg total) by mouth daily. 06/05/23  Yes Hawks, Christy A, FNP  Polyethyl Glycol-Propyl Glycol (SYSTANE) 0.4-0.3 % GEL ophthalmic gel Place 1 application into both eyes at bedtime as needed (dry eyes).   Yes [provider]  predniSONE  (DELTASONE ) 5 MG tablet TAKE 1 TABLET (5 MG TOTAL) BY MOUTH 2 (TWO) TIMES DAILY WITH A MEAL. 06/18/23  Yes Hawks, Christy A, FNP  vitamin C  (ASCORBIC ACID ) 500 MG tablet Take 1,000 mg by mouth daily. AM   Yes [provider]  Vitamin D , Ergocalciferol , (DRISDOL ) 1.25 MG (50000 UNIT) CAPS capsule  TAKE 1 CAPSULE (50,000 UNITS TOTAL) BY MOUTH EVERY 7 (SEVEN) DAYS 09/26/22  Yes Lavell Bari LABOR, FNP    Physical Exam: Vitals:   11/24/23 1637 11/24/23 1638 11/24/23 1942 11/24/23 2145  BP: 129/77   (!) 147/53  Pulse: 61  (!) 108 72  Resp: 16  (!) 22 20  Temp: 98.6 F (37 C)   98.5 F (36.9 C)  TempSrc: Oral     SpO2: 98%  92% 95%  Weight:  85.7 kg    Height:  5' 7 (1.702 m)      Constitutional: NAD, calm, comfortable Vitals:   11/24/23 1637 11/24/23 1638 11/24/23 1942 11/24/23 2145  BP: 129/77   (!) 147/53  Pulse: 61  (!) 108 72  Resp: 16  (!) 22 20  Temp: 98.6 F (37 C)   98.5 F (36.9 C)  TempSrc: Oral     SpO2: 98%  92% 95%  Weight:  85.7 kg    Height:  5' 7 (1.702 m)     Eyes: PERRL, lids and conjunctivae normal ENMT: Mucous membranes are moist.  Neck: normal, supple, no masses, no thyromegaly Respiratory: clear to auscultation bilaterally, no wheezing, no crackles. Normal respiratory effort. No accessory muscle use.  Cardiovascular: Regular rate and rhythm, no murmurs / rubs / gallops.  2+ pitting bilateral lower extremity edema to knees Abdomen: no tenderness, no masses palpated. No hepatosplenomegaly. Bowel sounds positive.  Musculoskeletal: no clubbing / cyanosis. No joint deformity upper and lower extremities. .  Skin: Ulcer to left calf posteriorly.  Differential warmth present, with swelling and erythema to left lower extremity compared to right. Neurologic: No facial asymmetry, moving extremity spontaneously, speech fluent Psychiatric: Normal judgment and insight. Alert and oriented x 3. Normal mood.     Labs on Admission: I have personally reviewed following labs and imaging studies  CBC: Recent Labs  Lab 11/24/23 1708  WBC 11.0*  NEUTROABS 8.8*  HGB 11.7*  HCT 39.1  MCV 84.1  PLT 340   Basic Metabolic Panel: Recent Labs  Lab 11/24/23 1708  NA 139  K 4.8  CL 99  CO2 25  GLUCOSE 182*  BUN 24*  CREATININE 1.18*  CALCIUM  10.0    GFR: Estimated Creatinine Clearance: 40.6 mL/min (A) (by C-G formula based on SCr of 1.18 mg/dL (H)). Liver Function Tests: Recent Labs  Lab 11/24/23 1708  AST 25  ALT 22  ALKPHOS 82  BILITOT 1.1  PROT 7.5  ALBUMIN 3.9    Radiological Exams on Admission: DG Tibia/Fibula Left Result Date: 11/24/2023 CLINICAL DATA:  Lower leg wound. EXAM: LEFT TIBIA AND FIBULA - 2 VIEW COMPARISON:  Radiograph 09/10/2023 FINDINGS: There is no evidence of fracture or other focal bone lesions. No erosions or periostitis. Knee and ankle alignment are maintained. Generalized subcutaneous edema. Soft tissue defect is seen posteriorly in the calf. No radiopaque foreign body or tracking soft tissue gas. Extensive vascular calcifications. IMPRESSION: 1. Generalized subcutaneous edema. Soft tissue defect posteriorly in the calf. No radiopaque foreign body or tracking soft tissue gas. 2. No acute osseous abnormality.  No evidence of osteomyelitis. Electronically Signed   By: Andrea Gasman M.D.   On: 11/24/2023 21:21   EKG: None.   Assessment/Plan Principal Problem:   Left leg cellulitis Active Problems:   Hypertension associated with diabetes (HCC)   Type 2 diabetes mellitus (HCC)   Pulmonary fibrosis (HCC)   Atrial fibrillation (HCC)   Chronic respiratory failure with hypoxia (HCC)   COPD (chronic obstructive pulmonary disease) (HCC)   CKD (chronic kidney disease), stage 3b (HCC)   Chronic diastolic heart failure (HCC)   Assessment and Plan: * Left leg cellulitis 2/2 to infected left leg ulcer-presenting with erythema, swelling, differential warmth.  Also with lactic acidosis- 3.9> 3.2.  WBC 11.  Temperature 98.6.  X-ray tib-fib- subcutaneous edema.  No foreign body or soft tissue gas. - IV ceftriaxone  and vancomycin  - Trend lactic acid - Wound Care consult -1.5 L bolus, will hold off on further fluids for now due to CHF. -Blood cultures -Reports dysuria, check UA  Chronic diastolic heart failure  (HCC) Chronic bilateral lower extremity swelling, chronic stable dyspnea.  O2 sats greater than 92% on room air.  On chronic O2 PRN.  Last echo 07/2022 EF of 65 to 70%. - May benefit from some diuresis, but currently with acute infection and lactic acidosis. -Hold home Lasix  40 mg daily for now  CKD (chronic kidney disease), stage 3b (HCC) CKD stage III A/B. Cr- 1.18.  At baseline.  Chronic respiratory failure with hypoxia [facility]) Patient unsure how much O2 she is on.  Reports as needed use.  Atrial fibrillation (HCC) Rate controlled on oral anticoagulation. -Resume atenolol , Eliquis   Pulmonary fibrosis (HCC) Also hx of COPD.  Resume prednisone  5 mg twice daily.  Type 2 diabetes mellitus (HCC) - HgbA1c - SSI- S - Hold home metformin   Hypertension associated with diabetes (HCC) Stable. -Resume Norvasc  5 mg, atenolol  25 mg daily   DVT prophylaxis: Eliquis  Code Status: FULL code-confirmed with patient at bedside Family Communication: None at bedside Disposition Plan: > 2 days Consults called: None Admission status: Inpt Tele  I certify that at the point of admission it is my clinical judgment that the patient will require inpatient hospital care spanning beyond 2 midnights from the point of admission due to high intensity of  service, high risk for further deterioration and high frequency of surveillance required.    Author: Tully FORBES Carwin, MD 11/24/2023 10:40 PM  For on call review [email or web address].

## 2023-11-24 NOTE — ED Notes (Signed)
 Pt linen changed and purewick placed per Dr. Melvenia

## 2023-11-24 NOTE — Assessment & Plan Note (Deleted)
 Stable.  Also history of pulmonary hypertension/ fibrosis.  On chronic O2 as needed

## 2023-11-24 NOTE — Progress Notes (Signed)
   11/24/23 2332  Provider Notification  Provider Name/Title Dr. Charlton  Date Provider Notified 11/24/23  Time Provider Notified 2332  Notification Reason Critical Result  Test performed and critical result lactic 2.9  Date Critical Result Received 11/24/23  Time Critical Result Received 2327   Informed MD that lactic is coming down

## 2023-11-24 NOTE — ED Triage Notes (Signed)
 Pt presents with wound to posterior lower left leg that developed a month ago from a her leg hitting a rocking chair. The wound is worsening. She saw her PCP today and was told to come to the ER for infection. She denies fever/chills. She has been taking Keflex .

## 2023-11-24 NOTE — Assessment & Plan Note (Signed)
 Stable. -Resume Norvasc  5 mg, atenolol  25 mg daily

## 2023-11-24 NOTE — Assessment & Plan Note (Signed)
 Rate controlled on oral anticoagulation. -Resume atenolol , Eliquis 

## 2023-11-24 NOTE — Assessment & Plan Note (Addendum)
 2/2 to infected left leg ulcer-presenting with erythema, swelling, differential warmth.  Also with lactic acidosis- 3.9> 3.2.  WBC 11.  Temperature 98.6.  X-ray tib-fib- subcutaneous edema.  No foreign body or soft tissue gas. - IV ceftriaxone  and vancomycin  - Trend lactic acid - Wound Care consult -1.5 L bolus, will hold off on further fluids for now due to CHF. -Blood cultures -Reports dysuria, check UA

## 2023-11-24 NOTE — Assessment & Plan Note (Signed)
 Patient unsure how much O2 she is on.  Reports as needed use.

## 2023-11-24 NOTE — Progress Notes (Signed)
 Acute Office Visit  Subjective:     Patient ID: Lorraine Leonard, female    DOB: August 03, 1940, 83 y.o.   MRN: [id number]  Chief Complaint  Patient presents with   leg wound    Lower left leg wound for a month     HPI Lorraine Leonard is an 83 year old female who presents with her granddaughter for an acute visit due to concerns about a wound on her left calf. According to the granddaughter, the wound was last evaluated by the patient's son on Friday. He contacted her today, reporting that the wound appears to have worsened. The patient denies any fever or chills and is currently taking oral antibiotics. She will be referred to ENT. The granddaughter also reports that [name] is not expressing any current pain.  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/01/2015   Chronic urticaria 03/24/2015   Gout 10/19/2015   Snoring 11/09/2015   Pulmonary hypertension (HCC) 11/09/2015   Obesity (BMI 30-39.9) 04/18/2016   Type 2 diabetes mellitus (HCC) 07/17/2016   Pulmonary fibrosis (HCC) 09/23/2016   Acute diastolic CHF (congestive heart failure) (HCC) 11/11/2016   Atrial fibrillation (HCC) 02/09/2017   Chronic respiratory failure with hypoxia (HCC) 03/26/2017   COPD (chronic obstructive pulmonary disease) (HCC) 03/26/2017   Osteoarthritis 07/22/2018   Idiopathic urticaria 10/19/2018   Asthmatic bronchitis , chronic (HCC) 03/19/2020    Intractable back pain 07/08/2020   CKD (chronic kidney disease), stage 3b (HCC) 07/08/2020   Aorto-iliac atherosclerosis (HCC) 07/08/2020   Chronic diastolic heart failure (HCC) 07/08/2020   Epigastric pain 03/27/2021   Gastric nodule 03/27/2021   Iron  deficiency anemia due to chronic blood loss 03/27/2021   History of colonic polyps 03/27/2021   Chronic congestive heart failure (HCC) 09/17/2023   Recurrent UTI 10/06/2023   Atrophic vaginitis 10/06/2023   Immunosuppression due to drug therapy (HCC) 10/06/2023   Ambulatory dysfunction 10/06/2023   Dependent for transportation 10/06/2023   Wound of left leg 11/24/2023   Resolved Ambulatory Problems    Diagnosis Date Noted   PULMONARY FIBROSIS 02/08/2007   DYSPNEA 02/08/2007   Cough 02/08/2007   Rheumatoid arthritis (HCC)    Hyperlipidemia    Pneumonia 09/23/2016   Acute respiratory failure with hypoxia (HCC) 09/23/2016   Atrial fibrillation with rapid ventricular response (HCC) 11/10/2016   Acute on chronic respiratory failure with hypoxia (HCC) 11/11/2016   CKD (chronic kidney disease) stage 2, GFR 60-89 ml/min 11/11/2016   Paroxysmal atrial fibrillation (HCC) 12/17/2016   Black stool 12/17/2016   Chest pain due to myocardial ischemia    Hypokalemia    Atrial fibrillation with RVR (HCC) 05/18/2017   Prediabetes 08/14/2020   Abnormal feces 03/27/2021   Anemia 03/27/2021   Infectious colitis, enteritis and gastroenteritis 03/27/2021   Weight loss 03/27/2021   Past Medical History:  Diagnosis Date   Allergic rhinitis    Arthritis    Chronic heart failure with preserved ejection fraction (HFpEF) (HCC)  Chronic kidney disease, stage 3a (HCC)    Chronic lower back pain    Diverticulosis    GERD (gastroesophageal reflux disease)    Glaucoma    History of blood transfusion 1983   Hypertension    Neuropathy    On home oxygen  therapy    Pre-diabetes    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   Sleep apnea     Urticaria      ROS Negative unless indicated in HPI    Objective:    BP 128/65   Pulse 73   Temp 97.8 F (36.6 C) (Temporal)   Ht 5' 7 (1.702 m)   Wt 187 lb 12.8 oz (85.2 kg)   SpO2 98%   BMI 29.41 kg/m    Physical Exam Vitals and nursing note reviewed.  HENT:     Head: Normocephalic and atraumatic.     Nose: Nose normal.  Eyes:     Extraocular Movements: Extraocular movements intact.     Conjunctiva/sclera: Conjunctivae normal.     Pupils: Pupils are equal, round, and reactive to light.  Cardiovascular:     Heart sounds: Normal heart sounds.  Pulmonary:     Effort: Pulmonary effort is normal.     Breath sounds: Normal breath sounds.  Skin:    Findings: Wound present.     Comments: Location: posterior aspects of the calf Size: see EMR Appearance: erythematous, purulent drainage, necrotic tissue, induration, warmth Odor: Absent Surrounding skin: erythema, cellulitis Signs of infection: Present Mobility: slow unsteady gait  Neurological:     Mental Status: She is alert and oriented to person, place, and time.  Psychiatric:        Mood and Affect: Mood normal.        Behavior: Behavior normal.        Thought Content: Thought content normal.        Judgment: Judgment normal.    Pertinent labs & imaging results that were available during my care of the patient were reviewed by me and considered in my medical decision making.  No results found for any visits on 11/24/23.      Assessment & Plan:  Wound of left lower extremity, initial encounter    [name] is a 83 year old Caucasian female seen today for left calf wound  Worsening left calf wound  On oral antibiotics, but wound condition is not improving No systemic signs of infection currently  Disposition: Patient will be transported to [facility] by her granddaughter today for further evaluation and management of the left calf wound.  Reason for ED Transfer: Concern for worsening wound  despite oral antibiotics; needs further assessment, possible imaging, IV antibiotics, or surgical evaluation.  Communication: Discussed need for emergency care with patient and granddaughter. They are in agreement with plan.  Follow-Up: Patient to follow up with PCP after ED evaluation or sooner as needed.  The above assessment and management plan was discussed with the patient. The patient verbalized understanding of and has agreed to the management plan. Patient is aware to call the clinic if they develop any new symptoms or if symptoms persist or worsen. Patient is aware when to return to the clinic for a follow-up visit. Patient educated on when it is appropriate to go to the emergency department.  Return for with PCP post ED d/c.  Keshan Reha [place] Thompson, DNP [facility] [address] [place], [place] [address] [phone]  Note: This document was prepared by [employer] voice  dictation technology and any errors that results from this process are unintentional.

## 2023-11-24 NOTE — Telephone Encounter (Signed)
 FYI Only or Action Required?: FYI only for provider.  Patient was last seen in primary care on 11/05/2023 by Severa Rock HERO, FNP.  Called Nurse Triage reporting Open Wound.  Symptoms began about a month ago.  Interventions attempted: Nothing.  Symptoms are: left calf laceration/open wound (looks like the scab came off, yellow concave wound about the size of a quarter) gradually worsening.  Triage Disposition: See Physician Within 24 Hours  Patient/caregiver understands and will follow disposition?: Yes           Copied from CRM #[id number]. Topic: Clinical - Red Word Triage >> Nov 24, 2023  9:18 AM Larissa RAMAN wrote: [facility] that prompted transfer to Nurse Triage: open wound-Lt Leg Reason for Disposition  [1] Looks infected (e.g., spreading redness, pus) AND [2] no fever  Answer Assessment - Initial Assessment Questions Denies any fevers.  1. APPEARANCE of INJURY: What does the injury look like?      Granddaughter states that it is a hole, looks like the scab came off. She states it has a yellow look to it. She also states both legs appear swollen from knees down. Left lower leg (from knee to ankle) looks red.   2. ONSET: How long ago did the injury occur?      1 month ago.  3. LOCATION: Where is the injury located?      Left calf.  4. SIZE: How large is the cut?      A little bit bigger than a size of a quarter. She also states the wound is concave.  5. BLEEDING: Is it bleeding now? If Yes, ask: Is it difficult to stop?      No.  6. PAIN: Is there any pain? If Yes, ask: How bad is the pain? (Scale 0-10; or none, mild, moderate, severe)     Unsure, she states the wound was painful to touch.  7. MECHANISM: Tell me how it happened.      Laceration from a rocking chair a month ago.  8. TETANUS: When was your last tetanus booster?     2021.  9. PREGNANCY: Is there any chance you are pregnant? When was your last menstrual period?      N/A.  Protocols used: Cuts and Lacerations-A-AH

## 2023-11-24 NOTE — Assessment & Plan Note (Signed)
 Chronic bilateral lower extremity swelling, chronic stable dyspnea.  O2 sats greater than 92% on room air.  On chronic O2 PRN.  Last echo 07/2022 EF of 65 to 70%. - May benefit from some diuresis, but currently with acute infection and lactic acidosis. -Hold home Lasix  40 mg daily for now

## 2023-11-24 NOTE — Progress Notes (Signed)
 Pharmacy Antibiotic Note  Lorraine Leonard is a 83 y.o. female admitted on 11/24/2023 with worsening ulcer to LLE.  Pharmacy has been consulted for vancomycin  dosing. Recently completed course of keflex  for UTI in 09/2023 with continued complaints of painful urination.  -WBC 11, sCr 1.18 (~bl), lactate 3.2, afebrile -Ceftriaxone /Vanc x1 -Blood cultures ordered -Completed imaging showing no evidence of osteomyelitis  Plan: -Ceftriaxone  1g IV every 24 hours  -Vancomycin  1g IV x1 -Vancomycin  500mg  IV every 12 hours (AUC 483, Vd 0.72, IBW, sCr 1.18) -Monitor renal function -Follow up signs of clinical improvement, LOT, de-escalation of antibiotics   Height: 5' 7 (170.2 cm) Weight: 85.7 kg (189 lb) IBW/kg (Calculated) : 61.6  Temp (24hrs), Avg:98.3 F (36.8 C), Min:97.8 F (36.6 C), Max:98.6 F (37 C)  Recent Labs  Lab 11/24/23 1708 11/24/23 1921  WBC 11.0*  --   CREATININE 1.18*  --   LATICACIDVEN 3.9* 3.2*    Estimated Creatinine Clearance: 40.6 mL/min (A) (by C-G formula based on SCr of 1.18 mg/dL (H)).    Allergies  Allergen Reactions   Macrodantin  [Nitrofurantoin  Macrocrystal] Other (See Comments)    Unknown    Mobic  [Meloxicam ] Swelling and Other (See Comments)    Feet and legs swell   Allopurinol  Swelling    Feet and legs swell    Antimicrobials this admission: Ceftriaxone  8/5 >>  Vancomycin  8/5 >>   Microbiology results: 8/5 BCx:   Thank you for allowing pharmacy to be a part of this patient's care.  Lynwood Poplar, PharmD, BCPS Clinical Pharmacist 11/24/2023 10:49 PM

## 2023-11-24 NOTE — Assessment & Plan Note (Signed)
-   HgbA1c ?- SSI- S ?-Hold home metformin ?

## 2023-11-24 NOTE — ED Provider Notes (Signed)
 [place] EMERGENCY DEPARTMENT AT [facility] Provider Note   CSN: [id number] Arrival date & time: 11/24/23  1614     Patient presents with: Wound Check   Lorraine Leonard is a 83 y.o. female.    Wound Check  Patient presents for concern of wound infection.  Medical history includes HTN, GERD, anxiety, pulmonary fibrosis, DM, CHF, atrial fibrillation, COPD, chronic pain, CKD, anemia.  She sustained a wound to her left lower leg month ago from striking a piece of furniture.  Wound has recently worsened.  Today, she saw her PCP, who was concerned of infection.  She is currently on Keflex  for treatment of UTI.  She has not been on any antibiotics for treatment of her wound.     Prior to Admission medications   Medication Sig Start Date End Date Taking? Authorizing Provider  albuterol  (VENTOLIN  HFA) 108 (90 Base) MCG/ACT inhaler INHALE 2 PUFFS INTO THE LUNGS EVERY 6 HOURS AS NEEDED FOR WHEEZE OR SHORTNESS OF BREATH 11/02/23  Yes Hawks, Christy A, FNP  amLODipine  (NORVASC ) 5 MG tablet Take 1 tablet (5 mg total) by mouth daily. 09/18/23 09/17/24 Yes Hawks, Christy A, FNP  atenolol  (TENORMIN ) 25 MG tablet TAKE 1 TABLET (25 MG TOTAL) BY MOUTH DAILY. 11/05/23  Yes Hawks, Christy A, FNP  calcium  citrate (CALCITRATE - DOSED IN MG ELEMENTAL CALCIUM ) 950 MG tablet Take 1 tablet by mouth daily.   Yes [provider]  cephALEXin  (KEFLEX ) 250 MG capsule Take 1 capsule (250 mg total) by mouth daily. Patient taking differently: Take 250 mg by mouth at bedtime. 10/06/23  Yes Gerldine Lauraine BROCKS, FNP  cyanocobalamin  1000 MCG tablet Take 1,000 mcg by mouth daily. Vitamin b12 every AM   Yes [provider]  ELIQUIS  5 MG TABS tablet TAKE 1 TABLET BY MOUTH TWICE A DAY 10/12/23  Yes Hawks, Christy A, FNP  fluticasone  (FLONASE ) 50 MCG/ACT nasal spray PLACE 1 SPRAY INTO BOTH NOSTRILS 2 (TWO) TIMES DAILY AS NEEDED FOR ALLERGIES OR RHINITIS. Patient taking differently: Place 1 spray into both  nostrils in the morning and at bedtime. 09/16/23  Yes Hawks, Christy A, FNP  furosemide  (LASIX ) 40 MG tablet Take 1 tablet (40 mg total) by mouth daily. 10/22/23  Yes Hawks, Christy A, FNP  Iron , Ferrous Sulfate , 325 (65 Fe) MG TABS Take 325 mg by mouth daily. 09/17/23  Yes Hawks, Christy A, FNP  levocetirizine (XYZAL ) 5 MG tablet TAKE 1 TABLET BY MOUTH EVERY DAY IN THE EVENING Patient taking differently: Take 5 mg by mouth every evening. 08/25/23  Yes Hawks, Christy A, FNP  levothyroxine  (SYNTHROID ) 100 MCG tablet TAKE 1 TABLET BY MOUTH EVERY DAY 11/05/23  Yes Hawks, Christy A, FNP  metFORMIN  (GLUCOPHAGE -XR) 500 MG 24 hr tablet TAKE 1 TABLET BY MOUTH EVERY DAY WITH BREAKFAST 11/05/23  Yes Hawks, Christy A, FNP  Multiple Vitamins-Minerals (PRESERVISION AREDS 2) CAPS Take 1 capsule by mouth 2 (two) times daily.   Yes [provider]  Olopatadine  HCl 0.2 % SOLN INSTILL 1 DROP INTO BOTH EYES EVERY DAY 04/30/20  Yes Hawks, Christy A, FNP  omeprazole  (PRILOSEC) 20 MG capsule Take 1 capsule (20 mg total) by mouth daily. 06/05/23  Yes Hawks, Christy A, FNP  Polyethyl Glycol-Propyl Glycol (SYSTANE) 0.4-0.3 % GEL ophthalmic gel Place 1 application into both eyes at bedtime as needed (dry eyes).   Yes [provider]  predniSONE  (DELTASONE ) 5 MG tablet TAKE 1 TABLET (5 MG TOTAL) BY MOUTH 2 (TWO) TIMES  DAILY WITH A MEAL. 06/18/23  Yes Hawks, Christy A, FNP  vitamin C  (ASCORBIC ACID ) 500 MG tablet Take 1,000 mg by mouth daily. AM   Yes [provider]  Vitamin D , Ergocalciferol , (DRISDOL ) 1.25 MG (50000 UNIT) CAPS capsule TAKE 1 CAPSULE (50,000 UNITS TOTAL) BY MOUTH EVERY 7 (SEVEN) DAYS 09/26/22  Yes Lavell Lye A, FNP    Allergies: Macrodantin  [nitrofurantoin  macrocrystal], Mobic  [meloxicam ], and Allopurinol     Review of Systems  Skin:  Positive for wound.  All other systems reviewed and are negative.   Updated Vital Signs BP 129/77 (BP Location: Right Arm)   Pulse 61   Temp 98.6 F  (37 C) (Oral)   Resp 16   Ht 5' 7 (1.702 m)   Wt 85.7 kg   SpO2 98%   BMI 29.60 kg/m   Physical Exam Vitals and nursing note reviewed.  Constitutional:      General: She is not in acute distress.    Appearance: Normal appearance. She is well-developed. She is not ill-appearing, toxic-appearing or diaphoretic.  HENT:     Head: Normocephalic and atraumatic.     Right Ear: External ear normal.     Left Ear: External ear normal.     Nose: Nose normal.     Mouth/Throat:     Mouth: Mucous membranes are moist.  Eyes:     Extraocular Movements: Extraocular movements intact.     Conjunctiva/sclera: Conjunctivae normal.  Cardiovascular:     Rate and Rhythm: Normal rate and regular rhythm.  Pulmonary:     Effort: Pulmonary effort is normal. No respiratory distress.  Abdominal:     General: There is no distension.     Palpations: Abdomen is soft.     Tenderness: There is no abdominal tenderness.  Musculoskeletal:        General: No swelling. Normal range of motion.     Cervical back: Normal range of motion and neck supple.  Skin:    General: Skin is warm and dry.     Coloration: Skin is not jaundiced or pale.     Findings: Erythema present.  Neurological:     General: No focal deficit present.     Mental Status: She is alert and oriented to person, place, and time.  Psychiatric:        Mood and Affect: Mood normal.        Behavior: Behavior normal.     (all labs ordered are listed, but only abnormal results are displayed) Labs Reviewed  CBC WITH DIFFERENTIAL/PLATELET - Abnormal; Notable for the following components:      Result Value   WBC 11.0 (*)    Hemoglobin 11.7 (*)    MCH 25.2 (*)    MCHC 29.9 (*)    RDW 20.6 (*)    Neutro Abs 8.8 (*)    Abs Immature Granulocytes 0.08 (*)    All other components within normal limits  COMPREHENSIVE METABOLIC PANEL WITH GFR - Abnormal; Notable for the following components:   Glucose, Bld 182 (*)    BUN 24 (*)    Creatinine, Ser  1.18 (*)    GFR, Estimated 46 (*)    All other components within normal limits  LACTIC ACID, PLASMA - Abnormal; Notable for the following components:   Lactic Acid, Venous 3.9 (*)    All other components within normal limits  LACTIC ACID, PLASMA - Abnormal; Notable for the following components:   Lactic Acid, Venous 3.2 (*)    All  other components within normal limits    EKG: None  Radiology: No results found.   Procedures   Medications Ordered in the ED  povidone-iodine  (BETADINE ) 10 % external solution (has no administration in time range)  lactated ringers  bolus 1,000 mL (has no administration in time range)  vancomycin  (VANCOCIN ) IVPB 1000 mg/200 mL premix (1,000 mg Intravenous New Bag/Given 11/24/23 2100)  cefTRIAXone  (ROCEPHIN ) 1 g in sodium chloride  0.9 % 100 mL IVPB (1 g Intravenous New Bag/Given 11/24/23 2108)  lactated ringers  bolus 1,000 mL (0 mLs Intravenous Stopped 11/24/23 2100)                                    Medical Decision Making Amount and/or Complexity of Data Reviewed Labs: ordered. Radiology: ordered.  Risk Prescription drug management. Decision regarding hospitalization.   This patient presents to the ED for concern of wound infection, this involves an extensive number of treatment options, and is a complaint that carries with it a high risk of complications and morbidity.  The differential diagnosis includes cellulitis, venous stasis ulcer, necrotizing faction   Co morbidities / Chronic conditions that complicate the patient evaluation  HTN, GERD, anxiety, pulmonary fibrosis, DM, CHF, atrial fibrillation, COPD, chronic pain, CKD, anemia   Additional history obtained:  Additional history obtained from EMR External records from outside source obtained and reviewed including N/A   Lab Tests:  I Ordered, and personally interpreted labs.  The pertinent results include: Lactic acidosis and leukocytosis concerning for sepsis.  Creatinine is  baseline.  Electrolytes are normal.   Imaging Studies ordered:  I ordered imaging studies including x-ray of distal left lower extremity I independently visualized and interpreted imaging which showed no foreign body no subcutaneous gas. I agree with the radiologist interpretation   Cardiac Monitoring: / EKG:  The patient was maintained on a cardiac monitor.  I personally viewed and interpreted the cardiac monitored which showed an underlying rhythm of: Rhythm   Problem List / ED Course / Critical interventions / Medication management  Patient presenting for concern of wound infection.  She developed a wound about a month ago on the posterior aspect of her left calf.  She has not been on any antibiotics for this.  Wound is been nonhealing and is worsening in appearance.  She has some localized pain and tenderness to the area.  She denies recent systemic symptoms.  She states that she is currently on Keflex .  Although she believes this is for UTI, per review of PCP note, this was prescribed for her wound.  She was reevaluated by PCP today and told to come to the ED for evaluation of the wound.  On assessment, wound extends to subcutaneous fat.  There is surrounding erythema.  Patient denies any significant pain while at rest.  Area is tender.  Lab work is notable for leukocytosis and lactic acidosis.  Patient was given IV fluids.  Repeat lactate is mildly improved.  Will order additional IV fluids and antibiotics.  Patient to be admitted ongoing treatment and for observation. I ordered medication including the fluids and antibiotics for cellulitis Reevaluation of the patient after these medicines showed that the patient stayed the same I have reviewed the patients home medicines and have made adjustments as needed  Social Determinants of Health:  Has PCP       Final diagnoses:  Wound infection  Failure of outpatient treatment  ED Discharge Orders     None          Melvenia Motto, MD 11/24/23 2129

## 2023-11-24 NOTE — Assessment & Plan Note (Addendum)
 Also hx of COPD.  Resume prednisone  5 mg twice daily.

## 2023-11-24 NOTE — Assessment & Plan Note (Signed)
 CKD stage III A/B. Cr- 1.18.  At baseline.

## 2023-11-25 DIAGNOSIS — L03116 Cellulitis of left lower limb: Secondary | ICD-10-CM | POA: Diagnosis not present

## 2023-11-25 LAB — CBC
HCT: 35.1 % — ABNORMAL LOW (ref 36.0–46.0)
Hemoglobin: 10.6 g/dL — ABNORMAL LOW (ref 12.0–15.0)
MCH: 24.8 pg — ABNORMAL LOW (ref 26.0–34.0)
MCHC: 30.2 g/dL (ref 30.0–36.0)
MCV: 82.2 fL (ref 80.0–100.0)
Platelets: 288 K/uL (ref 150–400)
RBC: 4.27 MIL/uL (ref 3.87–5.11)
RDW: 20.4 % — ABNORMAL HIGH (ref 11.5–15.5)
WBC: 8.3 K/uL (ref 4.0–10.5)
nRBC: 0 % (ref 0.0–0.2)

## 2023-11-25 LAB — BASIC METABOLIC PANEL WITH GFR
Anion gap: 13 (ref 5–15)
BUN: 19 mg/dL (ref 8–23)
CO2: 25 mmol/L (ref 22–32)
Calcium: 9.4 mg/dL (ref 8.9–10.3)
Chloride: 102 mmol/L (ref 98–111)
Creatinine, Ser: 1.05 mg/dL — ABNORMAL HIGH (ref 0.44–1.00)
GFR, Estimated: 53 mL/min — ABNORMAL LOW (ref >60)
Glucose, Bld: 129 mg/dL — ABNORMAL HIGH (ref 70–99)
Potassium: 3.7 mmol/L (ref 3.5–5.1)
Sodium: 140 mmol/L (ref 135–145)

## 2023-11-25 LAB — GLUCOSE, CAPILLARY
Glucose-Capillary: 135 mg/dL — ABNORMAL HIGH (ref 70–99)
Glucose-Capillary: 155 mg/dL — ABNORMAL HIGH (ref 70–99)
Glucose-Capillary: 200 mg/dL — ABNORMAL HIGH (ref 70–99)
Glucose-Capillary: 172 mg/dL — ABNORMAL HIGH (ref 70–99)

## 2023-11-25 LAB — URINALYSIS, ROUTINE W REFLEX MICROSCOPIC
Bilirubin Urine: NEGATIVE
Glucose, UA: NEGATIVE mg/dL
Hgb urine dipstick: NEGATIVE
Ketones, ur: NEGATIVE mg/dL
Leukocytes,Ua: NEGATIVE
Nitrite: NEGATIVE
Protein, ur: NEGATIVE mg/dL
Specific Gravity, Urine: 1.003 — ABNORMAL LOW (ref 1.005–1.030)
pH: 7 (ref 5.0–8.0)

## 2023-11-25 MED ORDER — TRAZODONE HCL 50 MG PO TABS
50.0000 mg | ORAL_TABLET | Freq: Every day | ORAL | Status: DC
  Administered 2023-11-25: 50 mg via ORAL
  Filled 2023-11-25: qty 1

## 2023-11-25 MED ORDER — OLOPATADINE HCL 0.1 % OP SOLN
1.0000 [drp] | Freq: Two times a day (BID) | OPHTHALMIC | Status: DC
  Administered 2023-11-25 – 2023-11-26 (×3): 1 [drp] via OPHTHALMIC
  Filled 2023-11-25: qty 5

## 2023-11-25 MED ORDER — MELATONIN 3 MG PO TABS
3.0000 mg | ORAL_TABLET | Freq: Every evening | ORAL | Status: DC | PRN
  Administered 2023-11-25 (×2): 3 mg via ORAL
  Filled 2023-11-25 (×2): qty 1

## 2023-11-25 NOTE — Progress Notes (Signed)
 PROGRESS NOTE  Lorraine Leonard, is a 83 y.o. female, DOB - 05-Aug-1940, FMW:[id number]  Admit date - 11/24/2023   Admitting Physician Tully FORBES Carwin, MD  Outpatient Primary MD for the patient is Lavell Bari LABOR, FNP  LOS - 1  Chief Complaint  Patient presents with   Wound Check      Brief Narrative:  83 y.o. female with medical history significant for diastolic CHF, atrial fibrillation, CKD 3, COPD, pulmonary hypertension/fibrosis, diabetes mellitus, OSA, chronic respiratory failure admitted on 11/24/2023 with left leg cellulitis    -Assessment and Plan: 1) sepsis secondary to left leg cellulitis---infected calf area ulcer--POA -Heart rate was 108 respiratory rate 22 WBC 11.0 --  X-ray tib-fib- subcutaneous edema.  No foreign body or soft tissue gas. -WBC down to 8.3 from 11.0 Lactic acid 3.9 >>3.2 >>2.9 -Pain, erythema and swelling improving - Continue IV ceftriaxone  and vancomycin  - Blood cultures NGTD =-Wound care consult appreciated  2)Chronic diastolic heart failure (HCC) Chronic bilateral lower extremity swelling, chronic stable dyspnea.  O2 sats greater than 92% on room air.  On chronic O2 PRN.  Last echo 07/2022 EF of 65 to 70%. -Be judicious with IV fluids despite concerns for sepsis -Hold home Lasix  40 mg daily for now  3)CKD (chronic kidney disease), stage 3b (HCC) -Renal function appears to be at baseline -- renally adjust medications, avoid nephrotoxic agents / dehydration  / hypotension  4)Chronic respiratory failure with hypoxia (HCC) -Uses 2 L via nasal cannula only as needed - Currently on room air here without oxygen  requirement  Atrial fibrillation (HCC) -Stable, continue Eliquis  for stroke prophylaxis - Continue atenolol  for rate control  Hypothyroidism--continue levothyroxine   Pulmonary fibrosis (HCC) Also hx of COPD.  Resume prednisone  5 mg twice daily.  Type 2 diabetes mellitus - prior A1c 6.5 reflecting good diabetic control PTA - Hold home  metformin  Use Novolog /Humalog Sliding scale insulin  with Accu-Cheks/Fingersticks as ordered   Hypertension associated with diabetes (HCC) Stable. -Resume Norvasc  5 mg, atenolol  25 mg daily  Status is: Inpatient   Disposition: The patient is from: Home              Anticipated d/c is to: Home              Anticipated d/c date is: 1 day              Patient currently is not medically stable to d/c. Barriers: Not Clinically Stable-   Code Status :  -  Code Status: Full Code   Family Communication:    NA (patient is alert, awake and coherent)   DVT Prophylaxis  :   - SCDs    apixaban  (ELIQUIS ) tablet 5 mg   Lab Results  Component Value Date   PLT 288 11/25/2023    Inpatient Medications  Scheduled Meds:  amLODipine   5 mg Oral Daily   apixaban   5 mg Oral BID   atenolol   25 mg Oral Daily   insulin  aspart  0-5 Units Subcutaneous QHS   insulin  aspart  0-9 Units Subcutaneous TID WC   levothyroxine   100 mcg Oral Q0600   olopatadine   1 drop Both Eyes BID   pantoprazole   40 mg Oral Daily   predniSONE   5 mg Oral BID WC   traZODone   50 mg Oral QHS   Continuous Infusions:  cefTRIAXone  (ROCEPHIN )  IV     vancomycin  500 mg (11/25/23 1113)   PRN Meds:.acetaminophen  **OR** acetaminophen , melatonin, ondansetron  **OR** ondansetron  (ZOFRAN ) IV, polyethylene glycol  Anti-infectives (From admission, onward)    Start     Dose/Rate Route Frequency Ordered Stop   11/25/23 2000  cefTRIAXone  (ROCEPHIN ) 1 g in sodium chloride  0.9 % 100 mL IVPB        1 g 200 mL/hr over 30 Minutes Intravenous Every 24 hours 11/24/23 2238     11/25/23 0800  vancomycin  (VANCOREADY) IVPB 500 mg/100 mL        500 mg 100 mL/hr over 60 Minutes Intravenous Every 12 hours 11/24/23 2254     11/24/23 2045  vancomycin  (VANCOCIN ) IVPB 1000 mg/200 mL premix        1,000 mg 200 mL/hr over 60 Minutes Intravenous  Once 11/24/23 2035 11/24/23 2200   11/24/23 2045  cefTRIAXone  (ROCEPHIN ) 1 g in sodium chloride  0.9 % 100  mL IVPB        1 g 200 mL/hr over 30 Minutes Intravenous  Once 11/24/23 2035 11/24/23 2145         Subjective: [name] today has no fevers, no emesis,  No chest pain,   - Leg pain and swelling improving -Oral intake is fair   Objective: Vitals:   11/24/23 2145 11/24/23 2338 11/25/23 0550 11/25/23 1252  BP: (!) 147/53 (!) 150/63 (!) 150/68 (!) 132/57  Pulse: 72 72 77 61  Resp: 20 18 18    Temp: 98.5 F (36.9 C) 98.3 F (36.8 C) 98.3 F (36.8 C) (!) 97.5 F (36.4 C)  TempSrc:  Oral Oral Axillary  SpO2: 95% 94% 94% 98%  Weight:  86.3 kg    Height:  5' 7 (1.702 m)      Intake/Output Summary (Last 24 hours) at 11/25/2023 1820 Last data filed at 11/25/2023 0700 Gross per 24 hour  Intake 1240 ml  Output 850 ml  Net 390 ml   Filed Weights   11/24/23 1638 11/24/23 2338  Weight: 85.7 kg 86.3 kg    Physical Exam  Gen:- Awake Alert,  in no apparent distress  HEENT:- [place].AT, No sclera icterus Neck-Supple Neck,No JVD,.  Lungs-  CTAB , fair symmetrical air movement CV- S1, S2 normal, ireregular , pacemaker in situ Abd-  +ve B.Sounds, Abd Soft, No tenderness,    Extremity/Skin:  pedal pulses present, left posterior calf area open wound without drainage dressing noted, surrounding erythema swelling and tenderness appears to be improving Psych-affect is appropriate, oriented x3 Neuro-no new focal deficits, no tremors  Data Reviewed: I have personally reviewed following labs and imaging studies  CBC: Recent Labs  Lab 11/24/23 1708 11/25/23 0434  WBC 11.0* 8.3  NEUTROABS 8.8*  --   HGB 11.7* 10.6*  HCT 39.1 35.1*  MCV 84.1 82.2  PLT 340 288   Basic Metabolic Panel: Recent Labs  Lab 11/24/23 1708 11/25/23 0434  NA 139 140  K 4.8 3.7  CL 99 102  CO2 25 25  GLUCOSE 182* 129*  BUN 24* 19  CREATININE 1.18* 1.05*  CALCIUM  10.0 9.4   GFR: Estimated Creatinine Clearance: 45.8 mL/min (A) (by C-G formula based on SCr of 1.05 mg/dL (H)). Liver Function  Tests: Recent Labs  Lab 11/24/23 1708  AST 25  ALT 22  ALKPHOS 82  BILITOT 1.1  PROT 7.5  ALBUMIN 3.9   Recent Results (from the past 240 hours)  Microscopic Examination     Status: Abnormal   Collection Time: 11/17/23  2:44 PM   Urine  Result Value Ref Range Status   WBC, UA 0-5 0 - 5 /hpf Final  RBC, Urine None seen 0 - 2 /hpf Final   Epithelial Cells (non renal) >10 (H) 0 - 10 /hpf Final   Bacteria, UA Few (A) None seen/Few Final  Culture, blood (Routine X 2) w Reflex to ID Panel     Status: None (Preliminary result)   Collection Time: 11/24/23 10:46 PM   Specimen: BLOOD  Result Value Ref Range Status   Specimen Description BLOOD BLOOD RIGHT ARM  Final   Special Requests   Final    BOTTLES DRAWN AEROBIC AND ANAEROBIC Blood Culture adequate volume   Culture   Final    NO GROWTH < 12 HOURS Performed at [facility], [address] [place], [place] [address]    Report Status PENDING  Incomplete  Culture, blood (Routine X 2) w Reflex to ID Panel     Status: None (Preliminary result)   Collection Time: 11/24/23 10:46 PM   Specimen: BLOOD  Result Value Ref Range Status   Specimen Description BLOOD BLOOD RIGHT HAND  Final   Special Requests   Final    BOTTLES DRAWN AEROBIC AND ANAEROBIC Blood Culture adequate volume   Culture   Final    NO GROWTH < 12 HOURS Performed at [facility], [address], [place], [place] [address]    Report Status PENDING  Incomplete    Radiology Studies: DG Tibia/Fibula Left Result Date: 11/24/2023 CLINICAL DATA:  Lower leg wound. EXAM: LEFT TIBIA AND FIBULA - 2 VIEW COMPARISON:  Radiograph 09/10/2023 FINDINGS: There is no evidence of fracture or other focal bone lesions. No erosions or periostitis. Knee and ankle alignment are maintained. Generalized subcutaneous edema. Soft tissue defect is seen posteriorly in the calf. No radiopaque foreign body or tracking soft tissue gas. Extensive vascular calcifications. IMPRESSION: 1. Generalized  subcutaneous edema. Soft tissue defect posteriorly in the calf. No radiopaque foreign body or tracking soft tissue gas. 2. No acute osseous abnormality.  No evidence of osteomyelitis. Electronically Signed   By: Andrea Gasman M.D.   On: 11/24/2023 21:21   Scheduled Meds:  amLODipine   5 mg Oral Daily   apixaban   5 mg Oral BID   atenolol   25 mg Oral Daily   insulin  aspart  0-5 Units Subcutaneous QHS   insulin  aspart  0-9 Units Subcutaneous TID WC   levothyroxine   100 mcg Oral Q0600   olopatadine   1 drop Both Eyes BID   pantoprazole   40 mg Oral Daily   predniSONE   5 mg Oral BID WC   traZODone   50 mg Oral QHS   Continuous Infusions:  cefTRIAXone  (ROCEPHIN )  IV     vancomycin  500 mg (11/25/23 1113)     LOS: 1 day    Rendall Carwin M.D on 11/25/2023 at 6:20 PM  Go to [email or web address] - for contact info  [facility] - Office  [phone]  If 7PM-7AM, please contact night-coverage [email or web address] 11/25/2023, 6:20 PM

## 2023-11-25 NOTE — Progress Notes (Signed)
 Mobility Specialist Progress Note:    11/25/23 0942  Mobility  Activity Ambulated with assistance;Pivoted/transferred from bed to chair  Level of Assistance Contact guard assist, steadying assist  Assistive Device Front wheel walker  Distance Ambulated (ft) 20 ft  Range of Motion/Exercises Active;All extremities  Activity Response Tolerated well  Mobility Referral Yes  Mobility visit 1 Mobility  Mobility Specialist Start Time (ACUTE ONLY) [id number]  Mobility Specialist Stop Time (ACUTE ONLY) [id number]  Mobility Specialist Time Calculation (min) (ACUTE ONLY) 26 min   Pt received in bed, agreeable to mobility. Required CGA to stand and SBA to ambulate with RW. Tolerated well, asx throughout. Left pt in chair, alarm on and call bell in reach. All needs met.   Sherrilee Ditty Mobility Specialist Please contact via Special educational needs teacher or  Rehab office at [phone]

## 2023-11-25 NOTE — Progress Notes (Signed)
 Transition of Care Department [facility]) has reviewed patient and no other TOC needs have been identified at this time. We will continue to monitor patient advancement through interdisciplinary progression rounds. If new patient transition needs arise, please place a TOC consult.   11/25/23 0814  TOC Brief Assessment  Insurance and Status Reviewed  Patient has primary care physician Yes  Home environment has been reviewed Lives alone.  Prior level of function: Son lives next door and assists.  Prior/Current Home Services No current home services  Social Drivers of Health Review SDOH reviewed no interventions necessary  Readmission risk has been reviewed Yes  Transition of care needs no transition of care needs at this time

## 2023-11-25 NOTE — Consult Note (Signed)
 WOC Nurse Consult Note: patient suffered laceration L calf approximately 7/3; seen by primary MD 7/17 with debridement of hematoma; seen again 8/5 with worsening wound and concerns for cellulitis, sent to ED for further treatment  Reason for Consult: L leg wound  Wound type: full thickness r/t trauma  Pressure Injury POA: NA  Measurement: see nursing flowsheet  Wound bed:60% tan 40% red  Drainage (amount, consistency, odor) tan exudate noted on old dressing  Periwound: erythema and edema  Dressing procedure/placement/frequency: Cleanse L lower leg wound with Vashe wound cleanser Lorraine Leonard) do not rinse and allow to air dry. Using a Q tip applicator insert Vashe moistened gauze into wound bed daily, cover with dry gauze and secure with Kerlix roll gauze anchored around foot.  May apply Ace bandage wrapped in same fashion as Kerlix to secure dressing.   POC discussed with bedside nurse. WOC team will not follow. Patient would benefit from ongoing management of this wound at wound care center/orthopedics post discharge.   Thank you,    Powell Bar MSN, RN-BC, [employer]

## 2023-11-26 DIAGNOSIS — L03116 Cellulitis of left lower limb: Secondary | ICD-10-CM | POA: Diagnosis not present

## 2023-11-26 LAB — HEMOGLOBIN A1C
Hgb A1c MFr Bld: 6.9 % — ABNORMAL HIGH (ref 4.8–5.6)
Mean Plasma Glucose: 151 mg/dL

## 2023-11-26 LAB — GLUCOSE, CAPILLARY
Glucose-Capillary: 129 mg/dL — ABNORMAL HIGH (ref 70–99)
Glucose-Capillary: 203 mg/dL — ABNORMAL HIGH (ref 70–99)

## 2023-11-26 MED ORDER — CEFDINIR 300 MG PO CAPS: 300.0000 mg | ORAL_CAPSULE | Freq: Two times a day (BID) | ORAL | 0 refills | Status: AC

## 2023-11-26 MED ORDER — ACETAMINOPHEN 325 MG PO TABS: 650.0000 mg | ORAL_TABLET | Freq: Four times a day (QID) | ORAL | Status: AC | PRN

## 2023-11-26 MED ORDER — ALBUTEROL SULFATE HFA 108 (90 BASE) MCG/ACT IN AERS: INHALATION_SPRAY | RESPIRATORY_TRACT | 2 refills | Status: DC

## 2023-11-26 MED ORDER — DOXYCYCLINE HYCLATE 100 MG PO TABS
100.0000 mg | ORAL_TABLET | Freq: Two times a day (BID) | ORAL | 0 refills | Status: AC
Start: 2023-11-26 — End: 2023-12-03

## 2023-11-26 MED ORDER — AMLODIPINE BESYLATE 5 MG PO TABS: 2.5000 mg | ORAL_TABLET | Freq: Every day | ORAL | 2 refills | Status: DC

## 2023-11-26 NOTE — Discharge Instructions (Signed)
 1)Very Low-salt diet advised---Less than 2 gm of Sodium per day advised----ok to use Mrs DASH salt substitute instead of Salt 2)Weigh yourself daily, call if you gain more than 3 pounds in 1 day or more than 5 pounds in 1 week as your diuretic medications may need to be adjusted 3)-please take antibiotics as prescribed 4) continue left lower leg local wound care----Cleanse Left lower leg wound with Vashe wound cleanser, do not rinse and allow to air dry. Using a Q tip applicator insert Vashe moistened gauze into wound bed daily, cover with dry gauze and secure with Kerlix roll gauze anchored around foot.  May apply Ace bandage wrapped in same fashion as Kerlix to secure dressing.

## 2023-11-26 NOTE — Discharge Summary (Signed)
 Lorraine Leonard, is a 83 y.o. female  DOB Feb 16, 1941  MRN [id number].  Admission date:  11/24/2023  Admitting Physician  Tully FORBES Carwin, MD  Discharge Date:  11/26/2023   Primary MD  Lavell Bari LABOR, FNP  Recommendations for primary care physician for things to follow:  1)Very Low-salt diet advised---Less than 2 gm of Sodium per day advised----ok to use Mrs DASH salt substitute instead of Salt 2)Weigh yourself daily, call if you gain more than 3 pounds in 1 day or more than 5 pounds in 1 week as your diuretic medications may need to be adjusted 3)-please take antibiotics as prescribed 4) continue left lower leg local wound care----Cleanse Left lower leg wound with Vashe wound cleanser, do not rinse and allow to air dry. Using a Q tip applicator insert Vashe moistened gauze into wound bed daily, cover with dry gauze and secure with Kerlix roll gauze anchored around foot.  May apply Ace bandage wrapped in same fashion as Kerlix to secure dressing.  Admission Diagnosis  Wound infection [T14.8XXA, L08.9] Left leg cellulitis [L03.116] Failure of outpatient treatment [Z78.9]  Discharge Diagnosis  Wound infection [T14.8XXA, L08.9] Left leg cellulitis [L03.116] Failure of outpatient treatment [Z78.9]    Principal Problem:   Left leg cellulitis Active Problems:   Hypertension associated with diabetes (HCC)   Type 2 diabetes mellitus (HCC)   Pulmonary fibrosis (HCC)   Atrial fibrillation (HCC)   Chronic respiratory failure with hypoxia (HCC)   COPD (chronic obstructive pulmonary disease) (HCC)   CKD (chronic kidney disease), stage 3b (HCC)   Chronic diastolic heart failure (HCC)     Past Medical History:  Diagnosis Date   Allergic rhinitis    Anxiety    Anxiety    Arthritis    ~ all my joints (05/18/2017)   Atrial fibrillation (HCC) 09/2016   Atrial fibrillation with RVR (HCC) 05/18/2017   Chronic  heart failure with preserved ejection fraction (HFpEF) (HCC)    Chronic kidney disease, stage 3a (HCC)    Chronic lower back pain    COPD (chronic obstructive pulmonary disease) (HCC)    Depression    Diverticulosis    GERD (gastroesophageal reflux disease)    Glaucoma    Gout    History of blood transfusion 1983   when I gave a 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   Hyperlipidemia    Hypertension    Hypothyroidism    Macular degeneration of both eyes    Neuropathy    On home oxygen  therapy    3L; 24/7 (05/18/2017)   Pre-diabetes    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   Pulmonary fibrosis (HCC)    Rheumatoid arthritis (HCC)    Sleep apnea    uses 3 liters of o2 24/7   Urticaria     Past Surgical History:  Procedure Laterality Date   AV NODE ABLATION  05/18/2017   AV NODE ABLATION N/A 05/18/2017   Procedure: AV NODE ABLATION;  Surgeon: Waddell Danelle ORN, MD;  [facility]: [facility];  Service: Cardiovascular;  Laterality: N/A;   BACK SURGERY     BREAST LUMPECTOMY Left    CARDIOVERSION N/A 01/30/2017   Procedure: CARDIOVERSION;  Surgeon: Delford Maude BROCKS, MD;  Location: [facility];  Service: Cardiovascular;  Laterality: N/A;   CARDIOVERSION N/A 04/09/2017   Procedure: CARDIOVERSION;  Surgeon: Debera Jayson MATSU, MD;  Location: [facility];  Service: Cardiovascular;  Laterality: N/A;   CATARACT EXTRACTION W/ INTRAOCULAR LENS  IMPLANT, BILATERAL Bilateral    DILATION AND CURETTAGE OF UTERUS     EUS N/A 10/03/2015   Procedure: UPPER ENDOSCOPIC ULTRASOUND (EUS) RADIAL;  Surgeon: Elsie Cree, MD;  Location: [facility];  Service: Endoscopy;  Laterality: N/A;   FINE NEEDLE ASPIRATION N/A 10/03/2015   Procedure: FINE NEEDLE ASPIRATION (FNA) RADIAL;  Surgeon: Elsie Cree, MD;  Location: [facility];  Service: Endoscopy;  Laterality: N/A;   INSERT / REPLACE / REMOVE PACEMAKER  05/18/2017   LUMBAR LAMINECTOMY  1995; 06/23/2008   L4-5/notes 08/20/2010   NEPHRECTOMY LIVING  DONOR  1983   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 female with medical history significant for diastolic CHF, atrial fibrillation, CKD 3, COPD, pulmonary hypertension/fibrosis, diabetes mellitus, OSA, chronic respiratory failure. Patient presented to the ED with complaints of worsening ulcer to the left lower extremity.  With worsening pain, redness and swelling.  She traumatized her left leg about a month ago on a piece of furniture.  6/17 she was started on a course of Keflex -for UTI.  She reports ongoing pain with urination she is unable to tell me if this is new or persistent since treatment of UTI. She reports chronic intermittent difficulties breathing that is unchanged.  No chest pain.  She reports chronic bilateral lower extremity swelling that is worse than baseline.   ED Course: Temperature 98.6.  Heart rate 61-108.  Respirate rate 16-22.  Blood pressure systolic 129-147.  O2 sat greater than 92% on room air. WBC 11.  Lactic acidosis 3.9 > 3.2.  X-ray left tib-fib-generalized subcutaneous edema, Soft tissue defect posteriorly in the calf. No radiopaque foreign body or tracking soft tissue gas.  IV ceftriaxone  and vancomycin  given.  1 L bolus given.   Review of Systems: As per HPI all other systems reviewed and negative.   Hospital Course:     Brief Narrative:  83 y.o. female with medical history significant for diastolic CHF, atrial fibrillation, CKD 3, COPD, pulmonary  hypertension/fibrosis, diabetes mellitus, OSA, chronic respiratory failure admitted on 11/24/2023 with left leg cellulitis     -Assessment and Plan: 1)Sepsis secondary to left leg cellulitis---infected calf area ulcer--POA -Heart rate was 108 respiratory rate 22 WBC 11.0 --  X-ray tib-fib- subcutaneous edema.  No foreign body or soft tissue gas. -WBC down to 8.3 from 11.0>>8.3 Lactic acid 3.9 >>3.2 >>2.9 -Pain, erythema and swelling improving - Treated with IV ceftriaxone  and vancomycin  - Blood cultures NGTD =-Wound  care consult appreciated Overall much improved -Okay to discharge on Omnicef  and doxycycline    2)Chronic diastolic heart failure (HCC) Chronic bilateral lower extremity swelling, chronic stable dyspnea.  O2 sats greater than 92% on room air.  On chronic O2 PRN.  Last echo 07/2022 EF of 65 to 70%. - Resume PTA Lasix    3)CKD (chronic kidney disease), stage 3b (HCC) -Renal function appears to be at baseline -- renally adjust medications, avoid nephrotoxic agents / dehydration  / hypotension   4)Chronic respiratory failure with hypoxia (HCC) -Uses 2 L via nasal cannula only as needed - Currently on room air here without oxygen  requirement -Continue oxygen  as needed at 2 L nasal cannula   Atrial fibrillation (HCC) -Stable, continue Eliquis  for stroke prophylaxis - Continue atenolol  for rate control   Hypothyroidism--continue levothyroxine    Pulmonary fibrosis (HCC) Also hx of COPD.  Resume prednisone  5 mg twice daily.   Type 2 diabetes mellitus - prior A1c 6.5 reflecting good diabetic control PTA - Resume PTA regimen   Hypertension associated with diabetes (HCC) Stable. Reduce Norvasc  to 2.5 mg daily, atenolol  25 mg daily   Disposition: The patient is from: Home              Anticipated d/c is to: Home  Discharge Condition: stable  Follow UP   Follow-up Information     Lavell Bari LABOR, FNP. Schedule an appointment as soon as possible for a visit in 1  week(s).   Specialty: Family Medicine Why: For wound recheck Contact information: [address] [place] [address] [phone]                 Diet and Activity recommendation:  As advised  Discharge Instructions    Discharge Instructions     Call MD for:  difficulty breathing, headache or visual disturbances   Complete by: As directed    Call MD for:  persistant dizziness or light-headedness   Complete by: As directed    Call MD for:  persistant nausea and vomiting   Complete by: As directed    Call MD for:  redness, tenderness, or signs of infection (pain, swelling, redness, odor or green/yellow discharge around incision site)   Complete by: As directed    Call MD for:  temperature >100.4   Complete by: As directed    Diet - low sodium heart healthy   Complete by: As directed    Discharge instructions   Complete by: As directed    1)Very Low-salt diet advised---Less than 2 gm of Sodium per day advised----ok to use Mrs DASH salt substitute instead of Salt 2)Weigh yourself daily, call if you gain more than 3 pounds in 1 day or more than 5 pounds in 1 week as your diuretic medications may need to be adjusted 3)-please take antibiotics as prescribed 4) continue left lower leg local wound care----Cleanse Left lower leg wound with Vashe wound cleanser, do not rinse and allow to air dry. Using a Q tip applicator insert Vashe moistened gauze into wound bed daily, cover with dry gauze and secure with Kerlix roll gauze anchored around foot.  May apply Ace bandage wrapped in same fashion as Kerlix to secure dressing.   Discharge wound care:   Complete by: As directed    continue left lower leg local wound care----Cleanse Left lower leg wound with Vashe wound cleanser, do not rinse and allow to air dry. Using a Q tip applicator insert Vashe moistened gauze into wound bed daily, cover  with dry gauze and secure with Kerlix roll gauze anchored around foot.  May apply Ace  bandage wrapped in same fashion as Kerlix to secure dressing.   Increase activity slowly   Complete by: As directed         Discharge Medications     Allergies as of 11/26/2023       Reactions   Macrodantin  [nitrofurantoin  Macrocrystal] Other (See Comments)   Unknown    Mobic  [meloxicam ] Swelling, Other (See Comments)   Feet and legs swell   Allopurinol  Swelling   Feet and legs swell        Medication List     STOP taking these medications    cephALEXin  250 MG capsule Commonly known as: KEFLEX        TAKE these medications    acetaminophen  325 MG tablet Commonly known as: TYLENOL  Take 2 tablets (650 mg total) by mouth every 6 (six) hours as needed for mild pain (pain score 1-3) or fever (or Fever >/= 101).   albuterol  108 (90 Base) MCG/ACT inhaler Commonly known as: VENTOLIN  HFA INHALE 2 PUFFS INTO THE LUNGS EVERY 6 HOURS AS NEEDED FOR WHEEZE OR SHORTNESS OF BREATH   amLODipine  5 MG tablet Commonly known as: NORVASC  Take 0.5 tablets (2.5 mg total) by mouth daily. What changed: how much to take   ascorbic acid  500 MG tablet Commonly known as: VITAMIN C  Take 1,000 mg by mouth daily. AM   atenolol  25 MG tablet Commonly known as: TENORMIN  TAKE 1 TABLET (25 MG TOTAL) BY MOUTH DAILY.   calcium  citrate 950 (200 Ca) MG tablet Commonly known as: CALCITRATE - dosed in mg elemental calcium  Take 1 tablet by mouth daily.   cefdinir  300 MG capsule Commonly known as: OMNICEF  Take 1 capsule (300 mg total) by mouth 2 (two) times daily for 7 days.   cyanocobalamin  1000 MCG tablet Take 1,000 mcg by mouth daily. Vitamin b12 every AM   doxycycline  100 MG tablet Commonly known as: VIBRA -TABS Take 1 tablet (100 mg total) by mouth 2 (two) times daily for 7 days.   Eliquis  5 MG Tabs tablet Generic drug: apixaban  TAKE 1 TABLET BY MOUTH TWICE A DAY   fluticasone  50 MCG/ACT nasal spray Commonly known as: FLONASE  PLACE 1 SPRAY INTO BOTH NOSTRILS 2 (TWO) TIMES DAILY AS  NEEDED FOR ALLERGIES OR RHINITIS. What changed: when to take this   furosemide  40 MG tablet Commonly known as: LASIX  Take 1 tablet (40 mg total) by mouth daily.   Iron  (Ferrous Sulfate ) 325 (65 Fe) MG Tabs Take 325 mg by mouth daily.   levocetirizine 5 MG tablet Commonly known as: XYZAL  TAKE 1 TABLET BY MOUTH EVERY DAY IN THE EVENING What changed:  how much to take how to take this   levothyroxine  100 MCG tablet Commonly known as: SYNTHROID  TAKE 1 TABLET BY MOUTH EVERY DAY   metFORMIN  500 MG 24 hr tablet Commonly known as: GLUCOPHAGE -XR TAKE 1 TABLET BY MOUTH EVERY DAY WITH BREAKFAST   Olopatadine  HCl 0.2 % Soln INSTILL 1 DROP INTO BOTH EYES EVERY DAY   omeprazole  20 MG capsule Commonly known as: PRILOSEC Take 1 capsule (20 mg total) by mouth daily.   Polyethyl Glycol-Propyl Glycol 0.4-0.3 % Gel ophthalmic gel Commonly known as: SYSTANE Place 1 application into both eyes at bedtime as needed (dry eyes).   predniSONE  5 MG tablet Commonly known as: DELTASONE  TAKE 1 TABLET (5 MG TOTAL) BY MOUTH 2 (TWO) TIMES DAILY WITH A MEAL.  PreserVision AREDS 2 Caps Take 1 capsule by mouth 2 (two) times daily.   Vitamin D  (Ergocalciferol ) 1.25 MG (50000 UNIT) Caps capsule Commonly known as: DRISDOL  TAKE 1 CAPSULE (50,000 UNITS TOTAL) BY MOUTH EVERY 7 (SEVEN) DAYS               Discharge Care Instructions  (From admission, onward)           Start     Ordered   11/26/23 0000  Discharge wound care:       Comments: continue left lower leg local wound care----Cleanse Left lower leg wound with Vashe wound cleanser, do not rinse and allow to air dry. Using a Q tip applicator insert Vashe moistened gauze into wound bed daily, cover with dry gauze and secure with Kerlix roll gauze anchored around foot.  May apply Ace bandage wrapped in same fashion as Kerlix to secure dressing.   11/26/23 1618            Major procedures and Radiology Reports - PLEASE review detailed  and final reports for all details, in brief -   DG Tibia/Fibula Left Result Date: 11/24/2023 CLINICAL DATA:  Lower leg wound. EXAM: LEFT TIBIA AND FIBULA - 2 VIEW COMPARISON:  Radiograph 09/10/2023 FINDINGS: There is no evidence of fracture or other focal bone lesions. No erosions or periostitis. Knee and ankle alignment are maintained. Generalized subcutaneous edema. Soft tissue defect is seen posteriorly in the calf. No radiopaque foreign body or tracking soft tissue gas. Extensive vascular calcifications. IMPRESSION: 1. Generalized subcutaneous edema. Soft tissue defect posteriorly in the calf. No radiopaque foreign body or tracking soft tissue gas. 2. No acute osseous abnormality.  No evidence of osteomyelitis. Electronically Signed   By: Andrea Gasman M.D.   On: 11/24/2023 21:21    Micro Results  Recent Results (from the past 240 hours)  Microscopic Examination     Status: Abnormal   Collection Time: 11/17/23  2:44 PM   Urine  Result Value Ref Range Status   WBC, UA 0-5 0 - 5 /hpf Final   RBC, Urine None seen 0 - 2 /hpf Final   Epithelial Cells (non renal) >10 (H) 0 - 10 /hpf Final   Bacteria, UA Few (A) None seen/Few Final  Culture, blood (Routine X 2) w Reflex to ID Panel     Status: None (Preliminary result)   Collection Time: 11/24/23 10:46 PM   Specimen: BLOOD  Result Value Ref Range Status   Specimen Description BLOOD BLOOD RIGHT ARM  Final   Special Requests   Final    BOTTLES DRAWN AEROBIC AND ANAEROBIC Blood Culture adequate volume   Culture   Final    NO GROWTH 2 DAYS Performed at [facility], [address] [place], [place] [address]    Report Status PENDING  Incomplete  Culture, blood (Routine X 2) w Reflex to ID Panel     Status: None (Preliminary result)   Collection Time: 11/24/23 10:46 PM   Specimen: BLOOD  Result Value Ref Range Status   Specimen Description BLOOD BLOOD RIGHT HAND  Final   Special Requests   Final    BOTTLES DRAWN AEROBIC AND ANAEROBIC Blood  Culture adequate volume   Culture   Final    NO GROWTH 2 DAYS Performed at [facility], [address] [place], [place] [address]    Report Status PENDING  Incomplete    Today   Subjective    Sharina Petre today has no new complaints  No fever  Or chills   No Nausea, Vomiting or Diarrhea      Patient has been seen and examined prior to discharge   Objective   Blood pressure (!) 100/40, pulse 71, temperature (!) 97.5 F (36.4 C), temperature source Oral, resp. rate 16, height 5' 7 (1.702 m), weight 86.3 kg, SpO2 96%.   Intake/Output Summary (Last 24 hours) at 11/26/2023 1620 Last data filed at 11/26/2023 9077 Gross per 24 hour  Intake 567.03 ml  Output --  Net 567.03 ml    Exam Gen:- Awake Alert, no acute distress  HEENT:- [place].AT, No sclera icterus Neck-Supple Neck,No JVD,.  Lungs-  CTAB , good air movement bilaterally CV- S1, S2 normal, regular, pacemaker in situ Abd-  +ve B.Sounds, Abd Soft, No tenderness,    Psych-affect is appropriate, oriented x3 Neuro-no new focal deficits, no tremors  -Extremity/Skin:  pedal pulses present, left posterior calf area open wound without drainage dressing noted, surrounding erythema swelling and tenderness appears to be improving    Data Review   CBC w Diff:  Lab Results  Component Value Date   WBC 8.3 11/25/2023   HGB 10.6 (L) 11/25/2023   HGB 10.8 (L) 09/17/2023   HCT 35.1 (L) 11/25/2023   HCT 37.4 09/17/2023   PLT 288 11/25/2023   PLT 369 09/17/2023   LYMPHOPCT 11 11/24/2023   MONOPCT 8 11/24/2023   EOSPCT 0 11/24/2023   BASOPCT 0 11/24/2023    CMP:  Lab Results  Component Value Date   NA 140 11/25/2023   NA 140 10/22/2023   K 3.7 11/25/2023   CL 102 11/25/2023   CO2 25 11/25/2023   BUN 19 11/25/2023   BUN 26 10/22/2023   CREATININE 1.05 (H) 11/25/2023   PROT 7.5 11/24/2023   PROT 7.6 09/17/2023   ALBUMIN 3.9 11/24/2023   ALBUMIN 4.6 09/17/2023   BILITOT 1.1 11/24/2023   BILITOT 0.3 09/17/2023   ALKPHOS  82 11/24/2023   AST 25 11/24/2023   ALT 22 11/24/2023  .  Total Discharge time is about 33 minutes  Rendall Carwin M.D on 11/26/2023 at 4:20 PM  Go to [email or web address] -  for contact info  [facility] - Office  [phone]

## 2023-11-26 NOTE — Progress Notes (Signed)
 Mobility Specialist Progress Note:    11/26/23 1111  Mobility  Activity Ambulated with assistance;Pivoted/transferred from bed to chair  Level of Assistance Standby assist, set-up cues, supervision of patient - no hands on  Assistive Device Front wheel walker  Distance Ambulated (ft) 5 ft  Range of Motion/Exercises Active;All extremities  Activity Response Tolerated well  Mobility Referral Yes  Mobility visit 1 Mobility  Mobility Specialist Start Time (ACUTE ONLY) 1056  Mobility Specialist Stop Time (ACUTE ONLY) 1111  Mobility Specialist Time Calculation (min) (ACUTE ONLY) 15 min   Pt received in bed, agreeable to mobility. Required supervision to stand and ambulate with RW. Tolerated well, asx throughout. Alarm on, all needs met.   Sherrilee Ditty Mobility Specialist Please contact via Special educational needs teacher or  Rehab office at [phone]

## 2023-11-26 NOTE — Care Management Important Message (Signed)
 Important Message  Patient Details  Name: Lorraine Leonard MRN: [id number] Date of Birth: [birth year]   Important Message Given:  N/A - LOS <3 / Initial given by admissions     Duwaine LITTIE Ada 11/26/2023, 10:03 AM

## 2023-11-27 ENCOUNTER — Telehealth: Payer: Self-pay

## 2023-11-27 ENCOUNTER — Other Ambulatory Visit: Payer: Self-pay

## 2023-11-27 NOTE — Transitions of Care (Post Inpatient/ED Visit) (Signed)
   11/27/2023  Name: Lorraine Leonard MRN: [id number] DOB: 1941/02/05  Today's TOC FU Call Status: Today's TOC FU Call Status:: Successful TOC FU Call Completed TOC FU Call Complete Date: 11/27/23 Patient's Name and Date of Birth confirmed.  Transition Care Management Follow-up Telephone Call Date of Discharge: 11/26/23 [facility] Type of Discharge: Inpatient Admission Primary Inpatient Discharge Diagnosis:: Left leg cellulitis How have you been since you were released from the hospital?: Better Any questions or concerns?: No  Items Reviewed: Did you receive and understand the discharge instructions provided?: Yes [name] states her brother [name], assisted patient with wound care today) Medications obtained,verified, and reconciled?: No ([name] states she is at work and lives in [facility] but manages patient's appointments, bills, etc but not with patient at this time) Do you have support at home?: Yes (patient lives alone but [name] sees patient several times daily and lives behind her per [name]) People in Home [RPT]: alone, child(ren), adult Name of Support/Comfort Primary Source: [name], dtr, [name] Anes and 2 granddtrs are in the health care field  Medications Reviewed Today: Daughter at work in [facility] Medications Reviewed Today   Medications were not reviewed in this encounter     Follow up appointments reviewed: PCP Follow-up appointment confirmed?: No [name] states she will call Monday for appointment follow up as to arrange with brothers/family) Do you need transportation to your follow-up appointment?: No ([name] states, [name] takes patient to most appointments) Do you understand care options if your condition(s) worsen?: Yes-patient verbalized understanding  *Offered TOC program and [name] states she will talk it over with her mom and brothers, after getting follow up appointment with [name] to call them on Monday.  [name], RN, BSN, CCM [name], [facility] RN Care Manager Direct Dial: [phone]

## 2023-11-27 NOTE — Transitions of Care (Post Inpatient/ED Visit) (Signed)
   11/27/2023  Name: VELERA LANSDALE MRN: [id number] DOB: 25-Jul-1940  Today's TOC FU Call Status: Today's TOC FU Call Status:: Unsuccessful Call (1st Attempt) Unsuccessful Call (1st Attempt) Date: 11/27/23  Attempted to reach the patient regarding the most recent Inpatient/ED visit. Left a HIPAA approved voicemail message to phone number provided in demographics per DPR to both numbers provided. [phone] does not have a mail box set up. Left message on [phone] (H).   Follow Up Plan: Additional outreach attempts will be made to reach the patient to complete the Transitions of Care (Post Inpatient/ED visit) call.   [name], RN, BSN, CCM [facility], [facility] RN Care Manager Direct Dial: [phone]

## 2023-11-29 LAB — CULTURE, BLOOD (ROUTINE X 2)
Culture: NO GROWTH
Culture: NO GROWTH
Special Requests: ADEQUATE
Special Requests: ADEQUATE

## 2023-12-10 ENCOUNTER — Ambulatory Visit (INDEPENDENT_AMBULATORY_CARE_PROVIDER_SITE_OTHER): Admitting: Family

## 2023-12-10 ENCOUNTER — Encounter: Payer: Self-pay | Admitting: Family

## 2023-12-10 VITALS — BP 145/73 | HR 75 | Temp 97.8°F

## 2023-12-10 DIAGNOSIS — S81802A Unspecified open wound, left lower leg, initial encounter: Secondary | ICD-10-CM | POA: Diagnosis not present

## 2023-12-10 DIAGNOSIS — N183 Chronic kidney disease, stage 3 unspecified: Secondary | ICD-10-CM | POA: Diagnosis not present

## 2023-12-10 DIAGNOSIS — L03116 Cellulitis of left lower limb: Secondary | ICD-10-CM

## 2023-12-10 DIAGNOSIS — E1142 Type 2 diabetes mellitus with diabetic polyneuropathy: Secondary | ICD-10-CM

## 2023-12-10 DIAGNOSIS — I5031 Acute diastolic (congestive) heart failure: Secondary | ICD-10-CM | POA: Diagnosis not present

## 2023-12-10 DIAGNOSIS — S81802D Unspecified open wound, left lower leg, subsequent encounter: Secondary | ICD-10-CM

## 2023-12-10 MED ORDER — DOXYCYCLINE HYCLATE 100 MG PO TABS: 100.0000 mg | ORAL_TABLET | Freq: Two times a day (BID) | ORAL | 0 refills | Status: DC

## 2023-12-10 MED ORDER — FUROSEMIDE 40 MG PO TABS: ORAL_TABLET | ORAL | 1 refills | Status: DC

## 2023-12-10 NOTE — Progress Notes (Signed)
 Subjective:    Patient ID: Lorraine Leonard, female    DOB: December 22, 1940, 83 y.o.   MRN: [id number]  Chief Complaint  Patient presents with   Hospitalization Follow-up  Today's visit was for Transitional Care Management.  The patient was discharged from [facility] on 11/26/23 with a primary diagnosis of left leg cellulitis.   Contact with the patient and/or caregiver, by a clinical staff member, was made on 11/27/23 and was documented as a telephone encounter within the EMR.  Through chart review and discussion with the patient I have determined that management of their condition is of moderate complexity.   She went to the ED with 11/24/23 with worsening infection. She reports she scraped her leg a month prior. She was diagnosed with sepsis secondary to left leg cellulitis. She was given IV ceftriaxone  and vancomycin  and discharged home on Omnicef  and doxycycline .   Blood cultures negative.   She reports her legs are improving. Reports mild pain 2 out 10 and mild discharge. She is taking lasix  40 mg daily.   Pt is a controlled diabetic. Last A1C 6.9.   Has CKD that is stable.  Wound Check She was originally treated more than 14 days ago. Previous treatment included IV/IM antibiotics. Her temperature was unmeasured prior to arrival. The redness has improved. The swelling has improved. The pain has improved.      Review of Systems  All other systems reviewed and are negative.   Social History   Socioeconomic History   Marital status: Widowed    Spouse name: Tasmine Hipwell   Number of children: 4   Years of education: 9   Highest education level: 9th grade  Occupational History   Occupation: retired    Comment: cna  Tobacco Use   Smoking status: Never    Passive exposure: Never   Smokeless tobacco: Never  Vaping Use   Vaping status: Never Used  Substance and Sexual Activity   Alcohol  use: No   Drug use: No   Sexual activity: Not Currently    Partners: Male    Birth  control/protection: Surgical  Other Topics Concern   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   Pt lives alone in single wide trailer. They have 4 children and 13 grandchildren.    Social Drivers of Corporate investment banker Strain: Low Risk  (03/28/2022)   Overall Financial Resource Strain (CARDIA)    Difficulty of Paying Living Expenses: Not hard at all  Food Insecurity: No Food Insecurity (11/24/2023)   Hunger Vital Sign    Worried About Running Out of Food in the Last Year: Never true    Ran Out of Food in the Last Year: Never true  Transportation Needs: No Transportation Needs (11/24/2023)   PRAPARE - Administrator, Civil Service (Medical): No    Lack of Transportation (Non-Medical): No  Physical Activity: Insufficiently Active (03/28/2022)   Exercise Vital Sign    Days of Exercise per Week: 3 days    Minutes of Exercise per Session: 30 min  Stress: No Stress Concern Present (03/28/2022)   [employer] of Occupational Health - Occupational Stress Questionnaire    Feeling of Stress : Not at all  Social Connections: Moderately Isolated (11/24/2023)   Social Connection and Isolation Panel    Frequency of Communication with Friends and Family: More than three times a week  Frequency of Social Gatherings with Friends and Family: More than three times a week    Attends Religious Services: 1 to 4 times per year    Active Member of [employer] or Organizations: No    Attends Banker Meetings: Never    Marital Status: Widowed   Family History  Problem Relation Age of Onset   Diabetes Other    Hypertension Other    Coronary artery disease Other    Stroke Other    Asthma Other    Kidney disease Sister    Cancer Sister        lung   Lung cancer Sister    Cancer Sister        lung   Lung cancer Sister    Cancer Sister        lung   Suicidality Son        committed suicide in 2009    CVA Son    Hyperlipidemia Other    Anxiety disorder Other    Migraines Other    Heart failure Father    Healthy Brother    Cancer Sister        lung   Healthy Sister         Objective:   Physical Exam Vitals reviewed.  Constitutional:      General: She is not in acute distress.    Appearance: She is well-developed.  HENT:     Head: Normocephalic and atraumatic.     Right Ear: Tympanic membrane normal.     Left Ear: Tympanic membrane normal.  Eyes:     Pupils: Pupils are equal, round, and reactive to light.  Neck:     Thyroid : No thyromegaly.  Cardiovascular:     Rate and Rhythm: Normal rate and regular rhythm.     Heart sounds: Normal heart sounds. No murmur heard. Pulmonary:     Effort: Pulmonary effort is normal. No respiratory distress.     Breath sounds: Normal breath sounds. No wheezing.  Abdominal:     General: Bowel sounds are normal. There is no distension.     Palpations: Abdomen is soft.     Tenderness: There is no abdominal tenderness.  Musculoskeletal:        General: No tenderness.     Cervical back: Normal range of motion and neck supple.     Right lower leg: Edema (3+) present.     Left lower leg: Edema (3+) present.  Skin:    General: Skin is warm and dry.     Comments: Ulcer on left posterior leg, approx 3X3.5 cm   Neurological:     Mental Status: She is alert and oriented to person, place, and time.     Cranial Nerves: No cranial nerve deficit.     Deep Tendon Reflexes: Reflexes are normal and symmetric.  Psychiatric:        Behavior: Behavior normal.        Thought Content: Thought content normal.        Judgment: Judgment normal.       BP (!) 145/73   Pulse 75   Temp 97.8 F (36.6 C) (Temporal)   SpO2 96%      Assessment & Plan:  ANDRYA ROPPOLO comes in today with chief complaint of Hospitalization Follow-up   Diagnosis and orders addressed:  1. Left leg cellulitis (Primary) - CMP14+EGFR - CBC with Differential/Platelet -  doxycycline  (VIBRA -TABS) 100 MG tablet; Take 1 tablet (100 mg total) by mouth  2 (two) times daily.  Dispense: 20 tablet; Refill: 0  2. Type 2 diabetes mellitus with diabetic polyneuropathy, without long-term current use of insulin  (HCC) - CMP14+EGFR - CBC with Differential/Platelet  3. Wound of left lower extremity, initial encounter - CMP14+EGFR - CBC with Differential/Platelet - doxycycline  (VIBRA -TABS) 100 MG tablet; Take 1 tablet (100 mg total) by mouth 2 (two) times daily.  Dispense: 20 tablet; Refill: 0  4. Stage 3 chronic kidney disease, unspecified whether stage 3a or 3b CKD (HCC) - CMP14+EGFR - CBC with Differential/Platelet - furosemide  (LASIX ) 40 MG tablet; Increase to 80 mg for 3 days then resume 40 mg daily  Dispense: 90 tablet; Refill: 1  5. Acute diastolic CHF (congestive heart failure) (HCC) - furosemide  (LASIX ) 40 MG tablet; Increase to 80 mg for 3 days then resume 40 mg daily  Dispense: 90 tablet; Refill: 1   Labs pending Continue current medications  Will continue doxycyline  Will increase Lasix  to 80 mg daily for three days then resume 40 mg daily Elevated foot Report any fevers, increased erythemas or discharge Follow up in 1 week   Bari Learn, FNP

## 2023-12-10 NOTE — Patient Instructions (Signed)
 Cellulitis, Adult  Cellulitis is a skin infection. The infected area is usually warm, red, swollen, and tender. It most commonly occurs on the lower body, such as the legs, feet, and toes, but this condition can occur on any part of the body. The infection can travel to the muscles, blood, and underlying tissue and become life-threatening without treatment. It is important to get medical treatment right away for this condition. What are the causes? Cellulitis is caused by bacteria. The bacteria enter through a break in the skin, such as a cut, burn, insect or animal bite, open sore, or crack. What increases the risk? This condition is more likely to occur in people who: Have a weak body's defense system (immune system). Are older than 83 years old. Have diabetes. Have a type of long-term (chronic) liver disease (cirrhosis) or kidney disease. Are obese. Have a skin condition such as: An itchy rash, such as eczema or psoriasis. A fungal rash on the feet or in skinfolds. Blistering rashes, such as shingles or chickenpox. Slow movement of blood in the veins (venous stasis). Fluid buildup below the skin (edema). Have open wounds on the skin, such as cuts, puncture wounds, burns, bites, scrapes, tattoos, piercings, or wounds from surgery. Have had radiation therapy. Use IV drugs. What are the signs or symptoms? Symptoms of this condition include: Skin that looks red, purple, or slightly darker than your usual skin color. Streaks or spots on the skin. Swollen area of the skin. Tenderness or pain when an area of the skin is touched. Warm skin. Fever or chills. Blisters. Tiredness (fatigue). How is this diagnosed? This condition is diagnosed based on a medical history and physical exam. You may also have tests, including: Blood tests. Imaging tests. Tests on a sample of fluid taken from the wound (wound culture). How is this treated? Treatment for this condition may include: Medicines.  These may include antibiotics or medicines to treat allergies (antihistamines). Rest. Applying cold or warm wet cloths (compresses) to the skin. If the condition is severe, you may need to stay in the hospital and get antibiotics through an IV. The infection usually starts to get better within 1-2 days of treatment. Follow these instructions at home: Medicines Take over-the-counter and prescription medicines only as told by your health care provider. If you were prescribed antibiotics, take them as told by your provider. Do not stop using the antibiotic even if you start to feel better. General instructions Drink enough fluid to keep your pee (urine) pale yellow. Do not touch or rub the infected area. Raise (elevate) the infected area above the level of your heart while you are sitting or lying down. Return to your normal activities as told by your provider. Ask your provider what activities are safe for you. Apply warm or cold compresses to the affected area as told by your provider. Keep all follow-up visits. Your provider will need to make sure that a more serious infection is not developing. Contact a health care provider if: You have a fever. Your symptoms do not improve within 1-2 days of starting treatment or you develop new symptoms. Your Proud or joint underneath the infected area becomes painful after the skin has healed. Your infection returns in the same area or another area. Signs of this may include: You notice a swollen bump in the infected area. Your red area gets larger, turns dark in color, or becomes more painful. Drainage increases. Pus or a bad smell develops in your infected area. You  have more pain. You feel ill and have muscle aches and weakness. You develop vomiting or diarrhea that will not go away. Get help right away if: You notice red streaks coming from the infected area. You notice the skin turns purple or black and falls off. This symptom may be an  emergency. Get help right away. Call 911. Do not wait to see if the symptom will go away. Do not drive yourself to the hospital. This information is not intended to replace advice given to you by your health care provider. Make sure you discuss any questions you have with your health care provider. Document Revised: 12/03/2021 Document Reviewed: 12/03/2021 Elsevier Patient Education  2024 [employer].

## 2023-12-11 ENCOUNTER — Ambulatory Visit: Payer: Self-pay | Admitting: Family

## 2023-12-11 LAB — CMP14+EGFR
ALT: 18 IU/L (ref 0–32)
AST: 23 IU/L (ref 0–40)
Albumin: 4.1 g/dL (ref 3.7–4.7)
Alkaline Phosphatase: 71 IU/L (ref 44–121)
BUN/Creatinine Ratio: 20 (ref 12–28)
BUN: 23 mg/dL (ref 8–27)
Bilirubin Total: 0.3 mg/dL (ref 0.0–1.2)
CO2: 22 mmol/L (ref 20–29)
Calcium: 10.4 mg/dL — AB (ref 8.7–10.3)
Chloride: 100 mmol/L (ref 96–106)
Creatinine, Ser: 1.17 mg/dL — AB (ref 0.57–1.00)
Globulin, Total: 2.7 g/dL (ref 1.5–4.5)
Glucose: 159 mg/dL — AB (ref 70–99)
Potassium: 5.1 mmol/L (ref 3.5–5.2)
Sodium: 139 mmol/L (ref 134–144)
Total Protein: 6.8 g/dL (ref 6.0–8.5)
eGFR: 46 mL/min/1.73 — AB (ref >59)

## 2023-12-11 LAB — CBC WITH DIFFERENTIAL/PLATELET
Basophils Absolute: 0.1 x10E3/uL (ref 0.0–0.2)
Basos: 1 %
EOS (ABSOLUTE): 0 x10E3/uL (ref 0.0–0.4)
Eos: 0 %
Hematocrit: 38.6 % (ref 34.0–46.6)
Hemoglobin: 11.4 g/dL (ref 11.1–15.9)
Immature Grans (Abs): 0 x10E3/uL (ref 0.0–0.1)
Immature Granulocytes: 0 %
Lymphocytes Absolute: 1.9 x10E3/uL (ref 0.7–3.1)
Lymphs: 17 %
MCH: 25.4 pg — ABNORMAL LOW (ref 26.6–33.0)
MCHC: 29.5 g/dL — ABNORMAL LOW (ref 31.5–35.7)
MCV: 86 fL (ref 79–97)
Monocytes Absolute: 0.9 x10E3/uL (ref 0.1–0.9)
Monocytes: 8 %
Neutrophils Absolute: 8.5 x10E3/uL — ABNORMAL HIGH (ref 1.4–7.0)
Neutrophils: 74 %
Platelets: 303 x10E3/uL (ref 150–450)
RBC: 4.48 x10E6/uL (ref 3.77–5.28)
RDW: 17.4 % — ABNORMAL HIGH (ref 11.7–15.4)
WBC: 11.5 x10E3/uL — ABNORMAL HIGH (ref 3.4–10.8)

## 2023-12-14 NOTE — Progress Notes (Deleted)
   Acute Office Visit  Subjective:     Patient ID: Lorraine Leonard, female    DOB: July 06, 1940, 83 y.o.   MRN: [id number]  No chief complaint on file.   HPI  ROS Negative unless indicated in HPI    Objective:    There were no vitals taken for this visit. {Vitals History (Optional):23777}  Physical Exam Pertinent labs & imaging results that were available during my care of the patient were reviewed by me and considered in my medical decision making.  No results found for any visits on 12/17/23.      Assessment & Plan:  There are no diagnoses linked to this encounter.  No follow-ups on file.  @Zoii Florer  Sebastian DNP@  Note: This document was prepared by [employer] and any errors that results from this process are unintentional.

## 2023-12-15 ENCOUNTER — Ambulatory Visit: Admitting: Family

## 2023-12-17 ENCOUNTER — Ambulatory Visit: Admitting: Nurse Practitioner

## 2023-12-22 ENCOUNTER — Ambulatory Visit (INDEPENDENT_AMBULATORY_CARE_PROVIDER_SITE_OTHER): Payer: Medicare Other

## 2023-12-22 DIAGNOSIS — I442 Atrioventricular block, complete: Secondary | ICD-10-CM | POA: Diagnosis not present

## 2023-12-24 LAB — CUP PACEART REMOTE DEVICE CHECK
Battery Remaining Longevity: 2 mo
Battery Voltage: 2.66 V
Brady Statistic AP VP Percent: 2.09 %
Brady Statistic AP VS Percent: 97.89 %
Brady Statistic AS VP Percent: 0 %
Brady Statistic AS VS Percent: 0.01 %
Brady Statistic RA Percent Paced: 99.99 %
Brady Statistic RV Percent Paced: 2.09 %
Date Time Interrogation Session: 20250902053556
Implantable Lead Connection Status: 753985
Implantable Lead Connection Status: 753985
Implantable Lead Implant Date: 20190128
Implantable Lead Implant Date: 20190128
Implantable Lead Location: 753860
Implantable Lead Location: 753860
Implantable Lead Model: 3830
Implantable Lead Model: 5076
Implantable Pulse Generator Implant Date: 20190128
Lead Channel Impedance Value: 323 Ohm
Lead Channel Impedance Value: 323 Ohm
Lead Channel Impedance Value: 380 Ohm
Lead Channel Impedance Value: 399 Ohm
Lead Channel Sensing Intrinsic Amplitude: 0.875 mV
Lead Channel Sensing Intrinsic Amplitude: 0.875 mV
Lead Channel Sensing Intrinsic Amplitude: 9 mV
Lead Channel Sensing Intrinsic Amplitude: 9 mV
Lead Channel Setting Pacing Amplitude: 2.5 V
Lead Channel Setting Pacing Amplitude: 2.5 V
Lead Channel Setting Pacing Pulse Width: 0.4 ms
Lead Channel Setting Sensing Sensitivity: 0.9 mV
Zone Setting Status: 755011
Zone Setting Status: 755011

## 2023-12-27 ENCOUNTER — Ambulatory Visit: Payer: Self-pay | Admitting: Internal Medicine

## 2023-12-27 DIAGNOSIS — I509 Heart failure, unspecified: Secondary | ICD-10-CM | POA: Diagnosis not present

## 2023-12-27 DIAGNOSIS — J8410 Pulmonary fibrosis, unspecified: Secondary | ICD-10-CM | POA: Diagnosis not present

## 2023-12-28 NOTE — Progress Notes (Signed)
 "    Subjective:  Patient ID: Lorraine Leonard, female    DOB: [birth year], 83 y.o.   MRN: [id number]  Patient Care Team: Lavell Bari LABOR, FNP as PCP - General (Family Medicine) Waddell Danelle ORN, MD as PCP - Electrophysiology (Cardiology) Alvan Dorn FALCON, MD as PCP - Cardiology (Cardiology) Darlean Ozell NOVAK, MD as Consulting Physician (Pulmonary Disease) Matilda Senior, MD as Consulting Physician (Urology) Vicci Mcardle, [place] (Optometry)   Chief Complaint:  Wound Check (Left lower leg wound)  [phone] HPI: Lorraine Leonard is a 83 y.o. female presenting on 12/29/2023 for Wound Check (Left lower leg wound)   Discussed the use of AI scribe software for clinical note transcription with the patient, who gave verbal consent to proceed.  History of Present Illness Lorraine Leonard is an 83 year old female who presents for a wound check on her left leg.  She was previously seen on August 25th and had follow-up visits with her primary care provider on August 21st.. Her son has been managing the wound care at home, changing the dressing daily. The wound care routine involves cleaning the wound, applying a yellow patch, covering it with a gauze, and securing it with tape. However, she has experienced skin irritation from the tape, which has caused soreness in areas previously injured.  No pain is associated with the wound, but it itches, and she refrains from scratching it.  She is currently taking Cefalexin 250 mg daily as a prophylactic for urinary tract infections. She also completed a course of doxycycline  as previously prescribed. Presents today for a wound check. She was last seen by her PCP on 12/09/20 and was prescribed antibiotics, which she reports completing as directed. She has since resumed her daily prophylactic antibiotics for recurrent urinary tract infections. Patient reports performing daily dressing changes but notes that the wound is not healing as expected. She is agreeable to a  referral to home health for wound care management. She denies fever, chills, or other systemic symptoms.   Relevant past medical, surgical, family, and social history reviewed and updated as indicated.  Allergies and medications reviewed and updated. Data reviewed: Chart in Epic.   Past Medical History:  Diagnosis Date   Allergic rhinitis    Anxiety    Anxiety    Arthritis    ~ all my joints (05/18/2017)   Atrial fibrillation (HCC) 09/2016   Atrial fibrillation with RVR (HCC) 05/18/2017   Chronic heart failure with preserved ejection fraction (HFpEF) (HCC)    Chronic kidney disease, stage 3a (HCC)    Chronic lower back pain    COPD (chronic obstructive pulmonary disease) (HCC)    Depression    Diverticulosis    GERD (gastroesophageal reflux disease)    Glaucoma    Gout    History of blood transfusion 1983   when I gave a 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   Hyperlipidemia    Hypertension    Hypothyroidism    Macular degeneration of both eyes    Neuropathy    On home oxygen  therapy    3L; 24/7 (05/18/2017)   Pre-diabetes    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   Pulmonary fibrosis (HCC)    Rheumatoid arthritis (HCC)    Sleep apnea    uses 3 liters of o2 24/7   Urticaria     Past Surgical History:  Procedure Laterality Date   AV NODE ABLATION  05/18/2017   AV NODE ABLATION N/A 05/18/2017   Procedure: AV  NODE ABLATION;  Surgeon: Waddell Danelle ORN, MD;  Location: [facility] INVASIVE CV LAB;  Service: Cardiovascular;  Laterality: N/A;   BACK SURGERY     BREAST LUMPECTOMY Left    CARDIOVERSION N/A 01/30/2017   Procedure: CARDIOVERSION;  Surgeon: Delford Maude BROCKS, MD;  Location: [facility] SU[facility];  Service: Cardiovascular;  Laterality: N/A;   CARDIOVERSION N/A 04/09/2017   Procedure: CARDIOVERSION;  Surgeon: Debera Jayson MATSU, MD;  Location: [facility];  Service: Cardiovascular;  Laterality: N/A;   CATARACT EXTRACTION W/ INTRAOCULAR LENS  IMPLANT, BILATERAL Bilateral     DILATION AND CURETTAGE OF UTERUS     EUS N/A 10/03/2015   Procedure: UPPER ENDOSCOPIC ULTRASOUND (EUS) RADIAL;  Surgeon: Elsie Cree, MD;  Location: [facility] ENDOSCOPY;  Service: Endoscopy;  Laterality: N/A;   FINE NEEDLE ASPIRATION N/A 10/03/2015   Procedure: FINE NEEDLE ASPIRATION (FNA) RADIAL;  Surgeon: Elsie Cree, MD;  Location: [facility]OS[facility];  Service: Endoscopy;  Laterality: N/A;   INSERT / REPLACE / REMOVE PACEMAKER  05/18/2017   LUMBAR LAMINECTOMY  1995; 06/23/2008   L4-5/notes 08/20/2010   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   NEPHRECTOMY TRANSPLANTED ORGAN     PACEMAKER IMPLANT N/A 05/18/2017   Procedure: PACEMAKER IMPLANT;  Surgeon: Waddell Danelle ORN, MD;  Location: [facility] INVASIVE CV LAB;  Service: Cardiovascular;  Laterality: N/A;   TOTAL ABDOMINAL HYSTERECTOMY     TUBAL LIGATION      Social History   Socioeconomic History   Marital status: Widowed    Spouse name: Tynisa Vohs   Number of children: 4   Years of education: 9   Highest education level: 9th grade  Occupational History   Occupation: retired    Comment: cna  Tobacco Use   Smoking status: Never    Passive exposure: Never   Smokeless tobacco: Never  Vaping Use   Vaping status: Never Used  Substance and Sexual Activity   Alcohol  use: No   Drug use: No   Sexual activity: Not Currently    Partners: Male    Birth control/protection: Surgical  Other Topics Concern   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   Pt lives alone in single wide trailer. They have 4 children and 13 grandchildren.    Social Drivers of Corporate Investment Banker Strain: Low Risk  (12/29/2023)   Overall Financial Resource Strain (CARDIA)    Difficulty of Paying Living Expenses: Not very hard  Food Insecurity: Food Insecurity Present (12/29/2023)   Hunger Vital Sign    Worried About Running Out of Food in the Last Year: Sometimes true    Ran Out  of Food in the Last Year: Never true  Transportation Needs: Unmet Transportation Needs (12/29/2023)   PRAPARE - Administrator, Civil Service (Medical): Yes    Lack of Transportation (Non-Medical): No  Physical Activity: Inactive (12/29/2023)   Exercise Vital Sign    Days of Exercise per Week: 0 days    Minutes of Exercise per Session: Not on file  Stress: Stress Concern Present (12/29/2023)   [employer] of Occupational Health - Occupational Stress Questionnaire    Feeling of Stress: To some extent  Social Connections: Socially Isolated (12/29/2023)   Social Connection and Isolation Panel    Frequency of Communication with Friends and Family: More than three times a week    Frequency of Social  Gatherings with Friends and Family: More than three times a week    Attends Religious Services: Never    Database Administrator or Organizations: No    Attends Banker Meetings: Not on file    Marital Status: Widowed  Intimate Partner Violence: Not At Risk (11/24/2023)   Humiliation, Afraid, Rape, and Kick questionnaire    Fear of Current or Ex-Partner: No    Emotionally Abused: No    Physically Abused: No    Sexually Abused: No    Outpatient Encounter Medications as of 12/29/2023  Medication Sig   acetaminophen  (TYLENOL ) 325 MG tablet Take 2 tablets (650 mg total) by mouth every 6 (six) hours as needed for mild pain (pain score 1-3) or fever (or Fever >/= 101).   albuterol  (VENTOLIN  HFA) 108 (90 Base) MCG/ACT inhaler INHALE 2 PUFFS INTO THE LUNGS EVERY 6 HOURS AS NEEDED FOR WHEEZE OR SHORTNESS OF BREATH   amLODipine  (NORVASC ) 5 MG tablet Take 0.5 tablets (2.5 mg total) by mouth daily.   atenolol  (TENORMIN ) 25 MG tablet TAKE 1 TABLET (25 MG TOTAL) BY MOUTH DAILY.   calcium  citrate (CALCITRATE - DOSED IN MG ELEMENTAL CALCIUM ) 950 MG tablet Take 1 tablet by mouth daily.   cyanocobalamin  1000 MCG tablet Take 1,000 mcg by mouth daily. Vitamin b12 every AM   ELIQUIS  5 MG TABS  tablet TAKE 1 TABLET BY MOUTH TWICE A DAY   fluticasone  (FLONASE ) 50 MCG/ACT nasal spray PLACE 1 SPRAY INTO BOTH NOSTRILS 2 (TWO) TIMES DAILY AS NEEDED FOR ALLERGIES OR RHINITIS. (Patient taking differently: Place 1 spray into both nostrils in the morning and at bedtime.)   furosemide  (LASIX ) 40 MG tablet Increase to 80 mg for 3 days then resume 40 mg daily   Iron , Ferrous Sulfate , 325 (65 Fe) MG TABS Take 325 mg by mouth daily.   levocetirizine (XYZAL ) 5 MG tablet TAKE 1 TABLET BY MOUTH EVERY DAY IN THE EVENING (Patient taking differently: Take 5 mg by mouth every evening.)   levothyroxine  (SYNTHROID ) 100 MCG tablet TAKE 1 TABLET BY MOUTH EVERY DAY   metFORMIN  (GLUCOPHAGE -XR) 500 MG 24 hr tablet TAKE 1 TABLET BY MOUTH EVERY DAY WITH BREAKFAST   Multiple Vitamins-Minerals (PRESERVISION AREDS 2) CAPS Take 1 capsule by mouth 2 (two) times daily.   Olopatadine  HCl 0.2 % SOLN INSTILL 1 DROP INTO BOTH EYES EVERY DAY   omeprazole  (PRILOSEC) 20 MG capsule Take 1 capsule (20 mg total) by mouth daily.   Polyethyl Glycol-Propyl Glycol (SYSTANE) 0.4-0.3 % GEL ophthalmic gel Place 1 application into both eyes at bedtime as needed (dry eyes).   predniSONE  (DELTASONE ) 5 MG tablet TAKE 1 TABLET (5 MG TOTAL) BY MOUTH 2 (TWO) TIMES DAILY WITH A MEAL.   vitamin C  (ASCORBIC ACID ) 500 MG tablet Take 1,000 mg by mouth daily. AM   Vitamin D , Ergocalciferol , (DRISDOL ) 1.25 MG (50000 UNIT) CAPS capsule TAKE 1 CAPSULE (50,000 UNITS TOTAL) BY MOUTH EVERY 7 (SEVEN) DAYS   doxycycline  (VIBRA -TABS) 100 MG tablet Take 1 tablet (100 mg total) by mouth 2 (two) times daily. (Patient not taking: Reported on 12/29/2023)   No facility-administered encounter medications on file as of 12/29/2023.    Allergies  Allergen Reactions   Macrodantin  [Nitrofurantoin  Macrocrystal] Other (See Comments)    Unknown    Mobic  [Meloxicam ] Swelling and Other (See Comments)    Feet and legs swell   Allopurinol  Swelling    Feet and legs swell     Pertinent ROS per  HPI, otherwise unremarkable      Objective:  BP (!) 149/75   Pulse 73   Temp (!) 97.4 F (36.3 C) (Temporal)   Ht 5' 7 (1.702 m)   Wt 183 lb 12.8 oz (83.4 kg)   SpO2 98%   BMI 28.79 kg/m    Wt Readings from Last 3 Encounters:  12/29/23 183 lb 12.8 oz (83.4 kg)  11/24/23 190 lb 4.1 oz (86.3 kg)  11/24/23 187 lb 12.8 oz (85.2 kg)    Physical Exam Constitutional:      General: She is not in acute distress. HENT:     Head: Normocephalic and atraumatic.     Nose: Nose normal.  Eyes:     Extraocular Movements: Extraocular movements intact.     Conjunctiva/sclera: Conjunctivae normal.     Pupils: Pupils are equal, round, and reactive to light.  Cardiovascular:     Heart sounds: Normal heart sounds.  Pulmonary:     Effort: Pulmonary effort is normal.     Breath sounds: Normal breath sounds.  Abdominal:     Palpations: Abdomen is soft.  Musculoskeletal:        General: Normal range of motion.     Right lower leg: No edema.     Left lower leg: No edema.  Skin:    General: Skin is warm and dry.     Findings: Abscess present.      Neurological:     Mental Status: She is alert and oriented to person, place, and time.  Psychiatric:        Mood and Affect: Mood normal.        Behavior: Behavior normal.        Thought Content: Thought content normal.        Judgment: Judgment normal.         Physical Exam EXTREMITIES: Wound on left leg appears to be healing. Tissue on left leg wound looks better, possible increase in size.     Results for orders placed or performed in visit on 12/22/23  CUP PACEART REMOTE DEVICE CHECK   Collection Time: 12/22/23  5:35 AM  Result Value Ref Range   Date Time Interrogation Session [id number]    Pulse Generator Manufacturer MERM    Pulse Gen Model [id number] Azure XT DR MRI    Pulse Gen Serial Number [id number]    Clinic Name [facility]    Implantable Pulse Generator Type Implantable Pulse Generator     Implantable Pulse Generator Implant Date 79809871    Implantable Lead Manufacturer [facility]    Implantable Lead Model 3830 SelectSecure    Implantable Lead Serial Number [id number]    Implantable Lead Implant Date 79809871    Implantable Lead Location Detail 1 UNKNOWN    Implantable Lead Special Function BUNDLE OF HIS    Implantable Lead Location [id number]    Implantable Lead Connection Status [id number]    Implantable Lead Manufacturer MERM    Implantable Lead Model 5076 CapSureFix Novus MRI SureScan    Implantable Lead Serial Number [id number]    Implantable Lead Implant Date 79809871    Implantable Lead Location Detail 1 APEX    Implantable Lead Location [id number]    Implantable Lead Connection Status [id number]    Lead Channel Setting Sensing Sensitivity 0.9 mV   Lead Channel Setting Pacing Amplitude 2.5 V   Lead Channel Setting Pacing Pulse Width 0.4 ms   Lead Channel Setting Pacing Amplitude 2.5 V   Zone Setting Status [phone]  Zone Setting Status [phone]    Lead Channel Impedance Value 399 ohm   Lead Channel Impedance Value 323 ohm   Lead Channel Sensing Intrinsic Amplitude 0.875 mV   Lead Channel Sensing Intrinsic Amplitude 0.875 mV   Lead Channel Impedance Value 380 ohm   Lead Channel Impedance Value 323 ohm   Lead Channel Sensing Intrinsic Amplitude 9 mV   Lead Channel Sensing Intrinsic Amplitude 9 mV   Battery Status OK    Battery Remaining Longevity 2 mo   Battery Voltage 2.66 V   Brady Statistic RA Percent Paced 99.99 %   Brady Statistic RV Percent Paced 2.09 %   Brady Statistic AP VP Percent 2.09 %   Brady Statistic AS VP Percent 0 %   Brady Statistic AP VS Percent 97.89 %   Brady Statistic AS VS Percent 0.01 %   *Note: Due to a large number of results and/or encounters for the requested time period, some results have not been displayed. A complete set of results can be found in Results Review.       Pertinent labs & imaging results that were available during my care of the  patient were reviewed by me and considered in my medical decision making.  Assessment & Plan:  Lorraine Leonard was seen today for wound check.  Diagnoses and all orders for this visit:  Wound of left lower extremity, initial encounter -     Ambulatory referral to Home Health     Assessment and Plan Assessment & Plan Open wound of left lower leg Wound healing with improved tissue quality. Itchiness and skin irritation from adhesive tape noted. Size changes uncertain due to lack of previous measurements. - Prescribed paper tape for dressing changes. - Referred to wound care for further evaluation and management. - Measure wound to assess size changes.  Wound of left lower extremity, initial encounter - Elevated leg -Continue dressing changes at home dialy   Report any fevers, increased erythemas or discharge Follow up in 1 week Continue all other maintenance medications.  Follow up plan: Return in about 1 week (around 01/05/2024) for wound check.   Continue healthy lifestyle choices, including diet (rich in fruits, vegetables, and lean proteins, and low in salt and simple carbohydrates) and exercise (at least 30 minutes of moderate physical activity daily).  Educational handout given for wound care, sign of infections  The above assessment and management plan was discussed with the patient. The patient verbalized understanding of and has agreed to the management plan. Patient is aware to call the clinic if they develop any new symptoms or if symptoms persist or worsen. Patient is aware when to return to the clinic for a follow-up visit. Patient educated on when it is appropriate to go to the emergency department.  Julena Barbour [place] Thompson, DNP [facility] [address] [place], [place] [address] [phone]   "

## 2023-12-29 ENCOUNTER — Ambulatory Visit: Admitting: Nurse Practitioner

## 2023-12-29 ENCOUNTER — Encounter: Payer: Self-pay | Admitting: Nurse Practitioner

## 2023-12-29 VITALS — BP 149/75 | HR 73 | Temp 97.4°F | Ht 67.0 in | Wt 183.8 lb

## 2023-12-29 DIAGNOSIS — S81802D Unspecified open wound, left lower leg, subsequent encounter: Secondary | ICD-10-CM | POA: Diagnosis not present

## 2023-12-29 DIAGNOSIS — S81802A Unspecified open wound, left lower leg, initial encounter: Secondary | ICD-10-CM

## 2023-12-29 NOTE — Progress Notes (Signed)
 Remote PPM Transmission

## 2024-01-05 ENCOUNTER — Ambulatory Visit: Admitting: Family

## 2024-01-07 ENCOUNTER — Telehealth: Payer: Self-pay | Admitting: Family Medicine

## 2024-01-07 NOTE — Telephone Encounter (Signed)
 Copied from CRM [id number]. Topic: Referral - Status >> Jan 07, 2024 10:28 AM Tobias CROME wrote: Reason for CRM: Patient's daughter states they have not heard from wound care home health services.   Daughter requesting callback with information to office so that she may reach out to them, [phone] or can reach out to niece, [phone]

## 2024-01-07 NOTE — Telephone Encounter (Signed)
 Please review and advise. I don't see any contact information on my end

## 2024-01-11 ENCOUNTER — Telehealth: Payer: Self-pay

## 2024-01-11 DIAGNOSIS — M109 Gout, unspecified: Secondary | ICD-10-CM | POA: Diagnosis not present

## 2024-01-11 DIAGNOSIS — Z556 Problems related to health literacy: Secondary | ICD-10-CM | POA: Diagnosis not present

## 2024-01-11 DIAGNOSIS — J449 Chronic obstructive pulmonary disease, unspecified: Secondary | ICD-10-CM | POA: Diagnosis not present

## 2024-01-11 DIAGNOSIS — Z7984 Long term (current) use of oral hypoglycemic drugs: Secondary | ICD-10-CM | POA: Diagnosis not present

## 2024-01-11 DIAGNOSIS — M069 Rheumatoid arthritis, unspecified: Secondary | ICD-10-CM | POA: Diagnosis not present

## 2024-01-11 DIAGNOSIS — I5032 Chronic diastolic (congestive) heart failure: Secondary | ICD-10-CM | POA: Diagnosis not present

## 2024-01-11 DIAGNOSIS — E1140 Type 2 diabetes mellitus with diabetic neuropathy, unspecified: Secondary | ICD-10-CM | POA: Diagnosis not present

## 2024-01-11 DIAGNOSIS — N1831 Chronic kidney disease, stage 3a: Secondary | ICD-10-CM | POA: Diagnosis not present

## 2024-01-11 DIAGNOSIS — Z9981 Dependence on supplemental oxygen: Secondary | ICD-10-CM | POA: Diagnosis not present

## 2024-01-11 DIAGNOSIS — I4891 Unspecified atrial fibrillation: Secondary | ICD-10-CM | POA: Diagnosis not present

## 2024-01-11 DIAGNOSIS — R040 Epistaxis: Secondary | ICD-10-CM | POA: Diagnosis not present

## 2024-01-11 DIAGNOSIS — D631 Anemia in chronic kidney disease: Secondary | ICD-10-CM | POA: Diagnosis not present

## 2024-01-11 DIAGNOSIS — J8410 Pulmonary fibrosis, unspecified: Secondary | ICD-10-CM | POA: Diagnosis not present

## 2024-01-11 DIAGNOSIS — I130 Hypertensive heart and chronic kidney disease with heart failure and stage 1 through stage 4 chronic kidney disease, or unspecified chronic kidney disease: Secondary | ICD-10-CM | POA: Diagnosis not present

## 2024-01-11 DIAGNOSIS — S81802D Unspecified open wound, left lower leg, subsequent encounter: Secondary | ICD-10-CM | POA: Diagnosis not present

## 2024-01-11 DIAGNOSIS — E039 Hypothyroidism, unspecified: Secondary | ICD-10-CM | POA: Diagnosis not present

## 2024-01-11 DIAGNOSIS — Z7901 Long term (current) use of anticoagulants: Secondary | ICD-10-CM | POA: Diagnosis not present

## 2024-01-11 DIAGNOSIS — H3530 Unspecified macular degeneration: Secondary | ICD-10-CM | POA: Diagnosis not present

## 2024-01-11 DIAGNOSIS — E1122 Type 2 diabetes mellitus with diabetic chronic kidney disease: Secondary | ICD-10-CM | POA: Diagnosis not present

## 2024-01-11 DIAGNOSIS — Z79899 Other long term (current) drug therapy: Secondary | ICD-10-CM | POA: Diagnosis not present

## 2024-01-11 NOTE — Telephone Encounter (Signed)
 Copied from CRM [id number]. Topic: Clinical - Home Health Verbal Orders >> Jan 08, 2024  5:02 PM Everette C wrote: Caller/Agency: [facility] Number: [phone] Service Requested: Skilled Nursing Start of care has been delayed until 01/12/24

## 2024-01-11 NOTE — Telephone Encounter (Signed)
 Noted

## 2024-01-13 ENCOUNTER — Encounter (HOSPITAL_COMMUNITY): Payer: Self-pay

## 2024-01-13 ENCOUNTER — Other Ambulatory Visit: Payer: Self-pay

## 2024-01-13 ENCOUNTER — Emergency Department (HOSPITAL_COMMUNITY)
Admission: EM | Admit: 2024-01-13 | Discharge: 2024-01-13 | Disposition: A | Discharge status: Home / Self Care | Attending: Emergency Medicine | Admitting: Emergency Medicine

## 2024-01-13 DIAGNOSIS — I5032 Chronic diastolic (congestive) heart failure: Secondary | ICD-10-CM | POA: Insufficient documentation

## 2024-01-13 DIAGNOSIS — J449 Chronic obstructive pulmonary disease, unspecified: Secondary | ICD-10-CM | POA: Diagnosis not present

## 2024-01-13 DIAGNOSIS — N1831 Chronic kidney disease, stage 3a: Secondary | ICD-10-CM | POA: Insufficient documentation

## 2024-01-13 DIAGNOSIS — Z950 Presence of cardiac pacemaker: Secondary | ICD-10-CM | POA: Insufficient documentation

## 2024-01-13 DIAGNOSIS — Z743 Need for continuous supervision: Secondary | ICD-10-CM | POA: Diagnosis not present

## 2024-01-13 DIAGNOSIS — Z7901 Long term (current) use of anticoagulants: Secondary | ICD-10-CM | POA: Diagnosis not present

## 2024-01-13 DIAGNOSIS — I110 Hypertensive heart disease with heart failure: Secondary | ICD-10-CM | POA: Diagnosis not present

## 2024-01-13 DIAGNOSIS — Z79899 Other long term (current) drug therapy: Secondary | ICD-10-CM | POA: Insufficient documentation

## 2024-01-13 DIAGNOSIS — R040 Epistaxis: Secondary | ICD-10-CM | POA: Insufficient documentation

## 2024-01-13 DIAGNOSIS — R58 Hemorrhage, not elsewhere classified: Secondary | ICD-10-CM | POA: Diagnosis not present

## 2024-01-13 DIAGNOSIS — E039 Hypothyroidism, unspecified: Secondary | ICD-10-CM | POA: Insufficient documentation

## 2024-01-13 MED ORDER — OXYMETAZOLINE HCL 0.05 % NA SOLN
1.0000 | Freq: Once | NASAL | Status: AC
Start: 2024-01-13 — End: 2024-01-13
  Administered 2024-01-13: 1 via NASAL

## 2024-01-13 NOTE — ED Notes (Signed)
 Patient called out for help, patient nose bleeding again. Patient informed me she had tried to get some dry skin out of her nose. informed her not to be putting her finger in her nose. New tissues given and patient holding pressure.

## 2024-01-13 NOTE — ED Triage Notes (Signed)
 Patient BIB [facility] from home for complaint of a Nosebleed. Pt told EMS I had a dry booger in my nose that I picked and my nose started to bleed. Hx of taking blood thinners. EMS Gave two sprays of afrin and noted that bleeding has stopped bleeding.

## 2024-01-13 NOTE — ED Provider Notes (Signed)
  EMERGENCY DEPARTMENT AT [facility] Provider Note   CSN: [id number] Arrival date & time: 01/13/24  9470     Patient presents with: Epistaxis   Lorraine Leonard is a 83 y.o. female.   HPI     This is a 83 year old female who presents with nosebleed.  Patient reports that she pulled a drop of her out of her left naris.  After that she began to have significant nosebleed.  She is on blood thinners.  EMS gave 2 sprays of Afrin and noted the bleeding to stop.  Reports mostly bleeding out of the left naris but at times was out of both naris.  Never had this happen before.  Prior to Admission medications   Medication Sig Start Date End Date Taking? Authorizing Provider  acetaminophen  (TYLENOL ) 325 MG tablet Take 2 tablets (650 mg total) by mouth every 6 (six) hours as needed for mild pain (pain score 1-3) or fever (or Fever >/= 101). 11/26/23   Emokpae, Courage, MD  albuterol  (VENTOLIN  HFA) 108 (90 Base) MCG/ACT inhaler INHALE 2 PUFFS INTO THE LUNGS EVERY 6 HOURS AS NEEDED FOR WHEEZE OR SHORTNESS OF BREATH 11/26/23   Pearlean Manus, MD  amLODipine  (NORVASC ) 5 MG tablet Take 0.5 tablets (2.5 mg total) by mouth daily. 11/26/23   Pearlean Manus, MD  atenolol  (TENORMIN ) 25 MG tablet TAKE 1 TABLET (25 MG TOTAL) BY MOUTH DAILY. 11/05/23   Lavell Bari LABOR, FNP  calcium  citrate (CALCITRATE - DOSED IN MG ELEMENTAL CALCIUM ) 950 MG tablet Take 1 tablet by mouth daily.    [provider]  cyanocobalamin  1000 MCG tablet Take 1,000 mcg by mouth daily. Vitamin b12 every AM    [provider]  doxycycline  (VIBRA -TABS) 100 MG tablet Take 1 tablet (100 mg total) by mouth 2 (two) times daily. Patient not taking: Reported on 12/29/2023 12/10/23   Lavell Bari LABOR, FNP  ELIQUIS  5 MG TABS tablet TAKE 1 TABLET BY MOUTH TWICE A DAY 10/12/23   Hawks, Christy A, FNP  fluticasone  (FLONASE ) 50 MCG/ACT nasal spray PLACE 1 SPRAY INTO BOTH NOSTRILS 2 (TWO) TIMES DAILY AS NEEDED FOR ALLERGIES  OR RHINITIS. Patient taking differently: Place 1 spray into both nostrils in the morning and at bedtime. 09/16/23   Lavell Bari LABOR, FNP  furosemide  (LASIX ) 40 MG tablet Increase to 80 mg for 3 days then resume 40 mg daily 12/10/23   Lavell Bari A, FNP  Iron , Ferrous Sulfate , 325 (65 Fe) MG TABS Take 325 mg by mouth daily. 09/17/23   Lavell Bari A, FNP  levocetirizine (XYZAL ) 5 MG tablet TAKE 1 TABLET BY MOUTH EVERY DAY IN THE EVENING Patient taking differently: Take 5 mg by mouth every evening. 08/25/23   Lavell Bari A, FNP  levothyroxine  (SYNTHROID ) 100 MCG tablet TAKE 1 TABLET BY MOUTH EVERY DAY 11/05/23   Lavell Bari A, FNP  metFORMIN  (GLUCOPHAGE -XR) 500 MG 24 hr tablet TAKE 1 TABLET BY MOUTH EVERY DAY WITH BREAKFAST 11/05/23   Hawks, Bari LABOR, FNP  Multiple Vitamins-Minerals (PRESERVISION AREDS 2) CAPS Take 1 capsule by mouth 2 (two) times daily.    [provider]  Olopatadine  HCl 0.2 % SOLN INSTILL 1 DROP INTO BOTH EYES EVERY DAY 04/30/20   Lavell Bari A, FNP  omeprazole  (PRILOSEC) 20 MG capsule Take 1 capsule (20 mg total) by mouth daily. 06/05/23   Lavell Bari LABOR, FNP  Polyethyl Glycol-Propyl Glycol (SYSTANE) 0.4-0.3 % GEL ophthalmic gel Place 1 application into both eyes at  bedtime as needed (dry eyes).    [provider]  predniSONE  (DELTASONE ) 5 MG tablet TAKE 1 TABLET (5 MG TOTAL) BY MOUTH 2 (TWO) TIMES DAILY WITH A MEAL. 06/18/23   Lavell Lye A, FNP  vitamin C  (ASCORBIC ACID ) 500 MG tablet Take 1,000 mg by mouth daily. AM    [provider]  Vitamin D , Ergocalciferol , (DRISDOL ) 1.25 MG (50000 UNIT) CAPS capsule TAKE 1 CAPSULE (50,000 UNITS TOTAL) BY MOUTH EVERY 7 (SEVEN) DAYS 09/26/22   Lavell Lye LABOR, FNP    Allergies: Macrodantin  [nitrofurantoin  macrocrystal], Mobic  [meloxicam ], and Allopurinol     Review of Systems  Constitutional:  Negative for fever.  HENT:  Positive for nosebleeds.   All other systems reviewed and are  negative.   Updated Vital Signs BP (!) 174/83   Pulse 76   Temp 97.9 F (36.6 C) (Oral)   Resp 17   Ht 1.702 m (5' 7)   Wt 83.4 kg   SpO2 96%   BMI 28.79 kg/m   Physical Exam Vitals and nursing note reviewed.  Constitutional:      Appearance: She is well-developed. She is not ill-appearing.  HENT:     Head: Normocephalic and atraumatic.     Nose:     Comments: Friability of the left nasal septum noted, no active bleeding    Mouth/Throat:     Mouth: Mucous membranes are moist.  Eyes:     Pupils: Pupils are equal, round, and reactive to light.  Cardiovascular:     Rate and Rhythm: Normal rate and regular rhythm.  Pulmonary:     Effort: Pulmonary effort is normal. No respiratory distress.  Abdominal:     Palpations: Abdomen is soft.  Musculoskeletal:     Cervical back: Neck supple.  Skin:    General: Skin is warm and dry.  Neurological:     Mental Status: She is alert and oriented to person, place, and time.  Psychiatric:        Mood and Affect: Mood normal.     (all labs ordered are listed, but only abnormal results are displayed) Labs Reviewed - No data to display  EKG: None  Radiology: No results found.   Epistaxis Management  Date/Time: 01/13/2024 6:12 AM  Performed by: Bari Charmaine FALCON, MD Authorized by: Bari Charmaine FALCON, MD   Consent:    Consent obtained:  Verbal   Consent given by:  Patient   Risks discussed:  Bleeding   Alternatives discussed:  No treatment Universal protocol:    Patient identity confirmed:  Verbally with patient Anesthesia:    Anesthesia method:  None Procedure details:    Treatment site:  L anterior   Repair method: Afrin.   Treatment complexity:  Limited   Treatment episode: initial   Post-procedure details:    Assessment:  Bleeding stopped   Procedure completion:  Tolerated    Medications Ordered in the ED  oxymetazoline  (AFRIN) 0.05 % nasal spray 1 spray (1 spray Each Nare Given 01/13/24 0542)                                     Medical Decision Making Risk OTC drugs.   This patient presents to the ED for concern of nosebleed, this involves an extensive number of treatment options, and is a complaint that carries with it a high risk of complications and morbidity.  I considered the following differential and  admission for this acute, potentially life threatening condition.  The differential diagnosis includes trauma, active bleed, anterior bleed, posterior bleed  MDM:    This is an 83 year old female who presents with a nosebleed.  She is nontoxic and vital signs are reassuring.  Requested the patient to evacuate and blow her nose.  Significant amount of clot was evacuated.  Home evaluation she has some friable nasal septum on the left.  2 sprays of Afrin was used and patient was monitored for approximately 45 minutes without recurrence of bleed.  We discussed management if the symptoms were to recur.  Do not feel she needs any workup at this time.  She is otherwise stable. (Labs, imaging, consults)  Labs: I Ordered, and personally interpreted labs.  The pertinent results include: None  Imaging Studies ordered: I ordered imaging studies including none I independently visualized and interpreted imaging. I agree with the radiologist interpretation  Additional history obtained from chart review.  External records from outside source obtained and reviewed including prior evaluations  Cardiac Monitoring: The patient was not maintained on a cardiac monitor.  If on the cardiac monitor, I personally viewed and interpreted the cardiac monitored which showed an underlying rhythm of: N/A  Reevaluation: After the interventions noted above, I reevaluated the patient and found that they have :resolved  Social Determinants of Health:  lives independently  Disposition: Discharge  Co morbidities that complicate the patient evaluation  Past Medical History:  Diagnosis Date   Allergic rhinitis     Anxiety    Anxiety    Arthritis    ~ all my joints (05/18/2017)   Atrial fibrillation (HCC) 09/2016   Atrial fibrillation with RVR (HCC) 05/18/2017   Chronic heart failure with preserved ejection fraction (HFpEF) (HCC)    Chronic kidney disease, stage 3a (HCC)    Chronic lower back pain    COPD (chronic obstructive pulmonary disease) (HCC)    Depression    Diverticulosis    GERD (gastroesophageal reflux disease)    Glaucoma    Gout    History of blood transfusion 1983   when I gave a 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   Hyperlipidemia    Hypertension    Hypothyroidism    Macular degeneration of both eyes    Neuropathy    On home oxygen  therapy    3L; 24/7 (05/18/2017)   Pre-diabetes    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

## 2024-01-13 NOTE — ED Notes (Signed)
 Patient noted to have dry blood on face, and patient holding nose upon arrival. This nurse cleaned patient face with wet towel, no bleeding noted when patient removed hand. Patient informed this nurse that she did still feel that her nose was still bleeding. Tissues given to patient.

## 2024-01-13 NOTE — Discharge Instructions (Signed)
 You were seen today for a nosebleed.  This is likely relating to picking your nose.  Avoid this in the future.  Make sure to keep the naris moist with nasal saline.  If nosebleed recurs, blow out all the clot and use Afrin.  If you cannot get it to stop, you should return.  ENT follow-up provided.

## 2024-01-13 NOTE — ED Notes (Signed)
 ED Provider at bedside.

## 2024-01-15 DIAGNOSIS — S81802A Unspecified open wound, left lower leg, initial encounter: Secondary | ICD-10-CM | POA: Diagnosis not present

## 2024-01-16 DIAGNOSIS — R040 Epistaxis: Secondary | ICD-10-CM | POA: Diagnosis not present

## 2024-01-16 DIAGNOSIS — Z9981 Dependence on supplemental oxygen: Secondary | ICD-10-CM | POA: Diagnosis not present

## 2024-01-16 DIAGNOSIS — E1140 Type 2 diabetes mellitus with diabetic neuropathy, unspecified: Secondary | ICD-10-CM | POA: Diagnosis not present

## 2024-01-16 DIAGNOSIS — I5032 Chronic diastolic (congestive) heart failure: Secondary | ICD-10-CM | POA: Diagnosis not present

## 2024-01-16 DIAGNOSIS — I4891 Unspecified atrial fibrillation: Secondary | ICD-10-CM | POA: Diagnosis not present

## 2024-01-16 DIAGNOSIS — N1831 Chronic kidney disease, stage 3a: Secondary | ICD-10-CM | POA: Diagnosis not present

## 2024-01-16 DIAGNOSIS — I130 Hypertensive heart and chronic kidney disease with heart failure and stage 1 through stage 4 chronic kidney disease, or unspecified chronic kidney disease: Secondary | ICD-10-CM | POA: Diagnosis not present

## 2024-01-16 DIAGNOSIS — Z7984 Long term (current) use of oral hypoglycemic drugs: Secondary | ICD-10-CM | POA: Diagnosis not present

## 2024-01-16 DIAGNOSIS — S81802D Unspecified open wound, left lower leg, subsequent encounter: Secondary | ICD-10-CM | POA: Diagnosis not present

## 2024-01-16 DIAGNOSIS — J449 Chronic obstructive pulmonary disease, unspecified: Secondary | ICD-10-CM | POA: Diagnosis not present

## 2024-01-16 DIAGNOSIS — Z556 Problems related to health literacy: Secondary | ICD-10-CM | POA: Diagnosis not present

## 2024-01-16 DIAGNOSIS — M069 Rheumatoid arthritis, unspecified: Secondary | ICD-10-CM | POA: Diagnosis not present

## 2024-01-16 DIAGNOSIS — J8410 Pulmonary fibrosis, unspecified: Secondary | ICD-10-CM | POA: Diagnosis not present

## 2024-01-16 DIAGNOSIS — E039 Hypothyroidism, unspecified: Secondary | ICD-10-CM | POA: Diagnosis not present

## 2024-01-16 DIAGNOSIS — Z7901 Long term (current) use of anticoagulants: Secondary | ICD-10-CM | POA: Diagnosis not present

## 2024-01-16 DIAGNOSIS — Z79899 Other long term (current) drug therapy: Secondary | ICD-10-CM | POA: Diagnosis not present

## 2024-01-16 DIAGNOSIS — M109 Gout, unspecified: Secondary | ICD-10-CM | POA: Diagnosis not present

## 2024-01-16 DIAGNOSIS — D631 Anemia in chronic kidney disease: Secondary | ICD-10-CM | POA: Diagnosis not present

## 2024-01-16 DIAGNOSIS — E1122 Type 2 diabetes mellitus with diabetic chronic kidney disease: Secondary | ICD-10-CM | POA: Diagnosis not present

## 2024-01-16 DIAGNOSIS — H3530 Unspecified macular degeneration: Secondary | ICD-10-CM | POA: Diagnosis not present

## 2024-01-18 ENCOUNTER — Emergency Department (HOSPITAL_COMMUNITY)
Admission: EM | Admit: 2024-01-18 | Discharge: 2024-01-18 | Disposition: A | Discharge status: Home / Self Care | Attending: Emergency Medicine | Admitting: Emergency Medicine

## 2024-01-18 DIAGNOSIS — N189 Chronic kidney disease, unspecified: Secondary | ICD-10-CM | POA: Insufficient documentation

## 2024-01-18 DIAGNOSIS — Z7951 Long term (current) use of inhaled steroids: Secondary | ICD-10-CM | POA: Diagnosis not present

## 2024-01-18 DIAGNOSIS — Z7901 Long term (current) use of anticoagulants: Secondary | ICD-10-CM | POA: Diagnosis not present

## 2024-01-18 DIAGNOSIS — N3090 Cystitis, unspecified without hematuria: Secondary | ICD-10-CM | POA: Insufficient documentation

## 2024-01-18 DIAGNOSIS — J449 Chronic obstructive pulmonary disease, unspecified: Secondary | ICD-10-CM | POA: Diagnosis not present

## 2024-01-18 DIAGNOSIS — R300 Dysuria: Secondary | ICD-10-CM | POA: Diagnosis present

## 2024-01-18 LAB — URINALYSIS, ROUTINE W REFLEX MICROSCOPIC
Bilirubin Urine: NEGATIVE
Glucose, UA: NEGATIVE mg/dL
Ketones, ur: NEGATIVE mg/dL
Nitrite: POSITIVE — AB
Protein, ur: 100 mg/dL — AB
Specific Gravity, Urine: 1.009 (ref 1.005–1.030)
WBC, UA: >50 WBC/hpf (ref 0–5)
pH: 6 (ref 5.0–8.0)

## 2024-01-18 MED ORDER — CIPROFLOXACIN HCL 500 MG PO TABS: 500.0000 mg | ORAL_TABLET | Freq: Two times a day (BID) | ORAL | 0 refills | Status: AC

## 2024-01-18 MED ORDER — CIPROFLOXACIN HCL 250 MG PO TABS
500.0000 mg | ORAL_TABLET | Freq: Once | ORAL | Status: AC
  Administered 2024-01-18: 500 mg via ORAL
  Filled 2024-01-18: qty 2

## 2024-01-18 MED ORDER — ACETAMINOPHEN 500 MG PO TABS
1000.0000 mg | ORAL_TABLET | Freq: Once | ORAL | Status: AC
  Administered 2024-01-18: 1000 mg via ORAL
  Filled 2024-01-18: qty 2

## 2024-01-18 NOTE — Discharge Instructions (Addendum)
 You were seen for your urinary tract infection in the emergency department.   At home, please take the antibiotics (ciprofloxacin ) we have prescribed you.   Follow-up with your primary doctor in 2-3 days regarding your visit.   Return immediately to the emergency department if you experience any of the following: high fevers, severe flank pain, or any other concerning symptoms.    Thank you for visiting our Emergency Department. It was a pleasure taking care of you today.

## 2024-01-18 NOTE — ED Notes (Signed)
 ED Provider at bedside.

## 2024-01-18 NOTE — ED Triage Notes (Signed)
 Pt arrives via EMS from home with c/o urethra pain and urinary frequency. Hx frequent UTIs and anxiety.

## 2024-01-18 NOTE — ED Notes (Signed)
 Pt requesting pain meds prior to DC. MD made aware.

## 2024-01-18 NOTE — ED Provider Notes (Signed)
 [employer] EMERGENCY DEPARTMENT AT [facility] Provider Note   CSN: [id number] Arrival date & time: 01/18/24  2111     Patient presents with: Urinary Frequency   Lorraine Leonard is a 83 y.o. female.  {Add pertinent medical, surgical, social history, OB history to YEP:67052} History obtained per patient and her son  89 yo F with hx of UTI, AF, CKD, and COPD who presents with dysuria. Pt reports that for the past 2-3 weeks has been having dysuria. Says that she has been talking out of her head and saying crazy things. Has happened with UTIs in the past. Confusion has been worse over the past few days. No flank pain, fevers, vomiting, or abdominal pain. Lives at home by herself but [name] lives right behind in another house and sees her daily.        Prior to Admission medications   Medication Sig Start Date End Date Taking? Authorizing Provider  acetaminophen  (TYLENOL ) 325 MG tablet Take 2 tablets (650 mg total) by mouth every 6 (six) hours as needed for mild pain (pain score 1-3) or fever (or Fever >/= 101). 11/26/23   Emokpae, Courage, MD  albuterol  (VENTOLIN  HFA) 108 (90 Base) MCG/ACT inhaler INHALE 2 PUFFS INTO THE LUNGS EVERY 6 HOURS AS NEEDED FOR WHEEZE OR SHORTNESS OF BREATH 11/26/23   Pearlean Manus, MD  amLODipine  (NORVASC ) 5 MG tablet Take 0.5 tablets (2.5 mg total) by mouth daily. 11/26/23   Pearlean Manus, MD  atenolol  (TENORMIN ) 25 MG tablet TAKE 1 TABLET (25 MG TOTAL) BY MOUTH DAILY. 11/05/23   Lavell Bari LABOR, FNP  calcium  citrate (CALCITRATE - DOSED IN MG ELEMENTAL CALCIUM ) 950 MG tablet Take 1 tablet by mouth daily.    [provider]  cyanocobalamin  1000 MCG tablet Take 1,000 mcg by mouth daily. Vitamin b12 every AM    [provider]  doxycycline  (VIBRA -TABS) 100 MG tablet Take 1 tablet (100 mg total) by mouth 2 (two) times daily. Patient not taking: Reported on 12/29/2023 12/10/23   Lavell Bari LABOR, FNP  ELIQUIS  5 MG TABS tablet TAKE 1 TABLET BY  MOUTH TWICE A DAY 10/12/23   Hawks, Christy A, FNP  fluticasone  (FLONASE ) 50 MCG/ACT nasal spray PLACE 1 SPRAY INTO BOTH NOSTRILS 2 (TWO) TIMES DAILY AS NEEDED FOR ALLERGIES OR RHINITIS. Patient taking differently: Place 1 spray into both nostrils in the morning and at bedtime. 09/16/23   Lavell Bari LABOR, FNP  furosemide  (LASIX ) 40 MG tablet Increase to 80 mg for 3 days then resume 40 mg daily 12/10/23   Lavell Bari A, FNP  Iron , Ferrous Sulfate , 325 (65 Fe) MG TABS Take 325 mg by mouth daily. 09/17/23   Lavell Bari A, FNP  levocetirizine (XYZAL ) 5 MG tablet TAKE 1 TABLET BY MOUTH EVERY DAY IN THE EVENING Patient taking differently: Take 5 mg by mouth every evening. 08/25/23   Lavell Bari LABOR, FNP  levothyroxine  (SYNTHROID ) 100 MCG tablet TAKE 1 TABLET BY MOUTH EVERY DAY 11/05/23   Lavell Bari A, FNP  metFORMIN  (GLUCOPHAGE -XR) 500 MG 24 hr tablet TAKE 1 TABLET BY MOUTH EVERY DAY WITH BREAKFAST 11/05/23   Hawks, Bari LABOR, FNP  Multiple Vitamins-Minerals (PRESERVISION AREDS 2) CAPS Take 1 capsule by mouth 2 (two) times daily.    [provider]  Olopatadine  HCl 0.2 % SOLN INSTILL 1 DROP INTO BOTH EYES EVERY DAY 04/30/20   Lavell Bari A, FNP  omeprazole  (PRILOSEC) 20 MG capsule Take 1 capsule (20 mg total) by  mouth daily. 06/05/23   Lavell Bari LABOR, FNP  Polyethyl Glycol-Propyl Glycol (SYSTANE) 0.4-0.3 % GEL ophthalmic gel Place 1 application into both eyes at bedtime as needed (dry eyes).    [provider]  predniSONE  (DELTASONE ) 5 MG tablet TAKE 1 TABLET (5 MG TOTAL) BY MOUTH 2 (TWO) TIMES DAILY WITH A MEAL. 06/18/23   Lavell Bari A, FNP  vitamin C  (ASCORBIC ACID ) 500 MG tablet Take 1,000 mg by mouth daily. AM    [provider]  Vitamin D , Ergocalciferol , (DRISDOL ) 1.25 MG (50000 UNIT) CAPS capsule TAKE 1 CAPSULE (50,000 UNITS TOTAL) BY MOUTH EVERY 7 (SEVEN) DAYS 09/26/22   Lavell Bari LABOR, FNP    Allergies: Macrodantin  [nitrofurantoin  macrocrystal], Mobic   [meloxicam ], and Allopurinol     Review of Systems  Updated Vital Signs BP (!) 153/61 (BP Location: Right Arm)   Pulse 78   Temp 97.7 F (36.5 C) (Oral)   Resp 20   SpO2 97%   Physical Exam Constitutional:      Appearance: Normal appearance.  Abdominal:     General: There is no distension.     Palpations: There is no mass.     Tenderness: There is no abdominal tenderness. There is no right CVA tenderness, left CVA tenderness or guarding.  Genitourinary:    Comments: Chaperoned by NT Recardo.  No erythema, cellulitis, crepitance, or evidence of Fournier's gangrene in the perineum Neurological:     General: No focal deficit present.     Mental Status: She is alert and oriented to person, place, and time. Mental status is at baseline.     (all labs ordered are listed, but only abnormal results are displayed) Labs Reviewed  URINALYSIS, ROUTINE W REFLEX MICROSCOPIC    EKG: None  Radiology: No results found.  {Document cardiac monitor, telemetry assessment procedure when appropriate:32947} Procedures   Medications Ordered in the ED - No data to display    {Click here for ABCD2, HEART and other calculators REFRESH Note before signing:1}                              Medical Decision Making Amount and/or Complexity of Data Reviewed Labs: ordered.   ***  {Document critical care time when appropriate  Document review of labs and clinical decision tools ie CHADS2VASC2, etc  Document your independent review of radiology images and any outside records  Document your discussion with family members, caretakers and with consultants  Document social determinants of health affecting pt's care  Document your decision making why or why not admission, treatments were needed:32947:::1}   Final diagnoses:  None    ED Discharge Orders     None

## 2024-01-20 DIAGNOSIS — Z556 Problems related to health literacy: Secondary | ICD-10-CM | POA: Diagnosis not present

## 2024-01-20 DIAGNOSIS — I5032 Chronic diastolic (congestive) heart failure: Secondary | ICD-10-CM | POA: Diagnosis not present

## 2024-01-20 DIAGNOSIS — I4891 Unspecified atrial fibrillation: Secondary | ICD-10-CM | POA: Diagnosis not present

## 2024-01-20 DIAGNOSIS — I130 Hypertensive heart and chronic kidney disease with heart failure and stage 1 through stage 4 chronic kidney disease, or unspecified chronic kidney disease: Secondary | ICD-10-CM | POA: Diagnosis not present

## 2024-01-20 DIAGNOSIS — H3530 Unspecified macular degeneration: Secondary | ICD-10-CM | POA: Diagnosis not present

## 2024-01-20 DIAGNOSIS — E1140 Type 2 diabetes mellitus with diabetic neuropathy, unspecified: Secondary | ICD-10-CM | POA: Diagnosis not present

## 2024-01-20 DIAGNOSIS — Z9981 Dependence on supplemental oxygen: Secondary | ICD-10-CM | POA: Diagnosis not present

## 2024-01-20 DIAGNOSIS — S81802D Unspecified open wound, left lower leg, subsequent encounter: Secondary | ICD-10-CM | POA: Diagnosis not present

## 2024-01-20 DIAGNOSIS — Z79899 Other long term (current) drug therapy: Secondary | ICD-10-CM | POA: Diagnosis not present

## 2024-01-20 DIAGNOSIS — D631 Anemia in chronic kidney disease: Secondary | ICD-10-CM | POA: Diagnosis not present

## 2024-01-20 DIAGNOSIS — M109 Gout, unspecified: Secondary | ICD-10-CM | POA: Diagnosis not present

## 2024-01-20 DIAGNOSIS — Z7901 Long term (current) use of anticoagulants: Secondary | ICD-10-CM | POA: Diagnosis not present

## 2024-01-20 DIAGNOSIS — Z7984 Long term (current) use of oral hypoglycemic drugs: Secondary | ICD-10-CM | POA: Diagnosis not present

## 2024-01-20 DIAGNOSIS — E1122 Type 2 diabetes mellitus with diabetic chronic kidney disease: Secondary | ICD-10-CM | POA: Diagnosis not present

## 2024-01-20 DIAGNOSIS — J8410 Pulmonary fibrosis, unspecified: Secondary | ICD-10-CM | POA: Diagnosis not present

## 2024-01-20 DIAGNOSIS — M069 Rheumatoid arthritis, unspecified: Secondary | ICD-10-CM | POA: Diagnosis not present

## 2024-01-20 DIAGNOSIS — J449 Chronic obstructive pulmonary disease, unspecified: Secondary | ICD-10-CM | POA: Diagnosis not present

## 2024-01-20 DIAGNOSIS — N1831 Chronic kidney disease, stage 3a: Secondary | ICD-10-CM | POA: Diagnosis not present

## 2024-01-20 DIAGNOSIS — R040 Epistaxis: Secondary | ICD-10-CM | POA: Diagnosis not present

## 2024-01-20 DIAGNOSIS — E039 Hypothyroidism, unspecified: Secondary | ICD-10-CM | POA: Diagnosis not present

## 2024-01-20 LAB — URINE CULTURE

## 2024-01-21 ENCOUNTER — Encounter

## 2024-01-22 ENCOUNTER — Ambulatory Visit

## 2024-01-25 ENCOUNTER — Ambulatory Visit: Payer: Self-pay

## 2024-01-25 NOTE — Telephone Encounter (Signed)
 FYI Only or Action Required?: Action required by provider: CAPs program paperwork request.  Patient was last seen in primary care on 12/29/2023 by Deitra Morton Sebastian Nena, NP.  Called Nurse Triage reporting Altered Mental Status.  Symptoms began several months ago.  Interventions attempted: Nothing.  Symptoms are: gradually worsening.  Triage Disposition: See Physician Within 24 Hours  Patient/caregiver understands and will follow disposition?: Yes      Copied from CRM #[id number]. Topic: Clinical - Red Word Triage >> Jan 25, 2024 10:15 AM Zane F wrote: [facility] that prompted transfer to Nurse Triage:   Concern: Hallucination; Paranoid;  Pain in urethra to the point of tears  Symptoms:  (Patient's daughter is concerned if the symptoms are related to a UTI, becomes agitated and hallucinate; Worried the patient may have dementia)  -Confused about her medication  -Stating that there were army clothes in the next room  -Asking that family members cover the windows  -Doesn't desire additional help due to saying people will steal from her  -Having trouble sleeping and calling the patient's daughter stating she can't sleep  When did the symptoms start?:    What have you done to aid in the concern ? Have you taken anything to assist with the matter?: Yes  Is so what are you taking?: Vagisil   Wanted to let you know I will be transferring you to further discuss your concern. Please be advised the nurse can assist with scheduling. Reason for Disposition  [1] Longstanding confusion (e.g., from dementia, stroke) AND [2] getting WORSE  Answer Assessment - Initial Assessment Questions This RN spoke with the patient's daughter regarding symptoms and pt already scheduled for an appointment in office tomorrow. Patient having paranoia episodes and increased agitation at times. She is concerned about her mom's symptoms and wants her PCP to be aware. Patient also has episodes of  confusion and not currently on meds for depression. Also requesting information about the CAP program paperwork being sent over for the patient.    1. MAIN CONCERN: What happened that made you call today?     Concerned about her mom possibly having dementia  2. DESCRIPTION: What are the hallucinations like? Describe them for me. (e.g., auditory, visual, tactile; worsening; threatening) If auditory, ask Are they telling you to hurt yourself or someone else?     Visual, states people can see her through the curtains  3. ONSET: When did this start? (e.g., hours, days, months)     Months  4. PATTERN: Do they come and go, or are they present all the time?  Are they present now?     Comes and goes  5. PRIOR HALLUCINATIONS: Have you had hallucinations in the past? (e.g., same, different, worse)     Symptoms worsening  6. MENTAL HEALTH HISTORY: Have you ever been diagnosed with schizophrenia or any other mental health problem? (e.g., bipolar disorder, schizophrenia; dementia, Parkinsons)     Denies  7. MENTAL HEALTH MEDICINES: Are you taking any medicines for depression or other mental health problems? (e.g., psychiatric medicines; sleeping pills)     Denies  9. MEDICINE CHANGES: Did you recently stop taking a medicine? (e.g., antidepressant, barbiturates, benzodiazepine, gabapentin )  Did you recently start a new medicine or increase the dose? (e.g., antidepressant, digoxin , sleep medicine, steroid, Parkinson's medicine)     On antibiotics currently for a UTI  10. SUPPORT: Is there anyone else with you?       She has her son,  nieces, and  daughter.  11. STRESS: Are you experiencing any particular stressors?       Loss of her husband  28. OTHER SYMPTOMS: Are there any other symptoms? (e.g., difficulty breathing, headache, fever, weakness)       Pain in urethra area that is severe, difficulty sleeping, mood swings  Protocols used: Hallucinations-A-AH

## 2024-01-25 NOTE — Telephone Encounter (Signed)
 Pt needs to go to ED with worsening symptoms.

## 2024-01-26 ENCOUNTER — Encounter: Payer: Self-pay | Admitting: Family

## 2024-01-26 ENCOUNTER — Ambulatory Visit: Admitting: Family

## 2024-01-26 ENCOUNTER — Ambulatory Visit

## 2024-01-26 VITALS — BP 102/63 | HR 76 | Temp 97.6°F | Ht 67.0 in

## 2024-01-26 DIAGNOSIS — B372 Candidiasis of skin and nail: Secondary | ICD-10-CM

## 2024-01-26 DIAGNOSIS — E1159 Type 2 diabetes mellitus with other circulatory complications: Secondary | ICD-10-CM | POA: Diagnosis not present

## 2024-01-26 DIAGNOSIS — N390 Urinary tract infection, site not specified: Secondary | ICD-10-CM | POA: Diagnosis not present

## 2024-01-26 DIAGNOSIS — D500 Iron deficiency anemia secondary to blood loss (chronic): Secondary | ICD-10-CM

## 2024-01-26 DIAGNOSIS — E039 Hypothyroidism, unspecified: Secondary | ICD-10-CM | POA: Diagnosis not present

## 2024-01-26 DIAGNOSIS — E1142 Type 2 diabetes mellitus with diabetic polyneuropathy: Secondary | ICD-10-CM | POA: Diagnosis not present

## 2024-01-26 DIAGNOSIS — K219 Gastro-esophageal reflux disease without esophagitis: Secondary | ICD-10-CM | POA: Diagnosis not present

## 2024-01-26 DIAGNOSIS — E1169 Type 2 diabetes mellitus with other specified complication: Secondary | ICD-10-CM

## 2024-01-26 DIAGNOSIS — J449 Chronic obstructive pulmonary disease, unspecified: Secondary | ICD-10-CM

## 2024-01-26 DIAGNOSIS — E559 Vitamin D deficiency, unspecified: Secondary | ICD-10-CM | POA: Diagnosis not present

## 2024-01-26 DIAGNOSIS — S81802S Unspecified open wound, left lower leg, sequela: Secondary | ICD-10-CM

## 2024-01-26 DIAGNOSIS — I152 Hypertension secondary to endocrine disorders: Secondary | ICD-10-CM | POA: Diagnosis not present

## 2024-01-26 DIAGNOSIS — K5901 Slow transit constipation: Secondary | ICD-10-CM | POA: Diagnosis not present

## 2024-01-26 DIAGNOSIS — R300 Dysuria: Secondary | ICD-10-CM | POA: Diagnosis not present

## 2024-01-26 DIAGNOSIS — N898 Other specified noninflammatory disorders of vagina: Secondary | ICD-10-CM

## 2024-01-26 DIAGNOSIS — F419 Anxiety disorder, unspecified: Secondary | ICD-10-CM

## 2024-01-26 DIAGNOSIS — F331 Major depressive disorder, recurrent, moderate: Secondary | ICD-10-CM

## 2024-01-26 DIAGNOSIS — E669 Obesity, unspecified: Secondary | ICD-10-CM

## 2024-01-26 LAB — CUP PACEART REMOTE DEVICE CHECK
Battery Remaining Longevity: 2 mo
Battery Voltage: 2.64 V
Brady Statistic AP VP Percent: 3.61 %
Brady Statistic AP VS Percent: 96.38 %
Brady Statistic AS VP Percent: 0 %
Brady Statistic AS VS Percent: 0.01 %
Brady Statistic RA Percent Paced: 99.99 %
Brady Statistic RV Percent Paced: 3.61 %
Date Time Interrogation Session: 20251003023510
Implantable Lead Connection Status: 753985
Implantable Lead Connection Status: 753985
Implantable Lead Implant Date: 20190128
Implantable Lead Implant Date: 20190128
Implantable Lead Location: 753860
Implantable Lead Location: 753860
Implantable Lead Model: 3830
Implantable Lead Model: 5076
Implantable Pulse Generator Implant Date: 20190128
Lead Channel Impedance Value: 342 Ohm
Lead Channel Impedance Value: 361 Ohm
Lead Channel Impedance Value: 399 Ohm
Lead Channel Impedance Value: 399 Ohm
Lead Channel Sensing Intrinsic Amplitude: 4.25 mV
Lead Channel Sensing Intrinsic Amplitude: 4.25 mV
Lead Channel Sensing Intrinsic Amplitude: 9.875 mV
Lead Channel Sensing Intrinsic Amplitude: 9.875 mV
Lead Channel Setting Pacing Amplitude: 2.5 V
Lead Channel Setting Pacing Amplitude: 2.5 V
Lead Channel Setting Pacing Pulse Width: 0.4 ms
Lead Channel Setting Sensing Sensitivity: 0.9 mV
Zone Setting Status: 755011
Zone Setting Status: 755011

## 2024-01-26 LAB — WET PREP FOR TRICH, YEAST, CLUE
Clue Cell Exam: NEGATIVE
Trichomonas Exam: NEGATIVE
Yeast Exam: NEGATIVE

## 2024-01-26 LAB — MICROSCOPIC EXAMINATION
Renal Epithel, UA: NONE SEEN /HPF
WBC, UA: >30 /HPF — AB (ref 0–5)
Yeast, UA: NONE SEEN

## 2024-01-26 LAB — URINALYSIS, COMPLETE
Bilirubin, UA: NEGATIVE
Nitrite, UA: POSITIVE — AB
Specific Gravity, UA: 1.02 (ref 1.005–1.030)
Urobilinogen, Ur: 1 mg/dL (ref 0.2–1.0)
pH, UA: 5.5 (ref 5.0–7.5)

## 2024-01-26 LAB — BAYER DCA HB A1C WAIVED: HB A1C (BAYER DCA - WAIVED): 6.6 % — ABNORMAL HIGH (ref 4.8–5.6)

## 2024-01-26 MED ORDER — METFORMIN HCL ER 500 MG PO TB24
500.0000 mg | ORAL_TABLET | Freq: Every day | ORAL | 2 refills | Status: DC
Start: 2024-01-26 — End: 2024-02-04

## 2024-01-26 MED ORDER — ATENOLOL 25 MG PO TABS: 25.0000 mg | ORAL_TABLET | Freq: Every day | ORAL | 0 refills | Status: AC

## 2024-01-26 MED ORDER — FLUCONAZOLE 150 MG PO TABS: 150.0000 mg | ORAL_TABLET | ORAL | 0 refills | Status: DC | PRN

## 2024-01-26 MED ORDER — IRON (FERROUS SULFATE) 325 (65 FE) MG PO TABS: 325.0000 mg | ORAL_TABLET | Freq: Every day | ORAL | 1 refills | Status: AC

## 2024-01-26 MED ORDER — NYSTATIN 100000 UNIT/GM EX POWD: 1.0000 | Freq: Three times a day (TID) | CUTANEOUS | 1 refills | Status: DC

## 2024-01-26 MED ORDER — APIXABAN 5 MG PO TABS: 5.0000 mg | ORAL_TABLET | Freq: Two times a day (BID) | ORAL | 0 refills | Status: DC

## 2024-01-26 NOTE — Progress Notes (Signed)
 Subjective:    Patient ID: Lorraine Leonard, female    DOB: [birth year], 83 y.o.   MRN: [id number]  Chief Complaint  Patient presents with   Medical Management of Chronic Issues    LEG IS HEALING GOOD   Pt presents to the office today for chronic follow up.    She is followed by Cardiologists every 6 months for CHF & A Fib.   She is followed by Pulmonologist annually for pulmonary hypertension, COPD, and Pulmonary Fibrosis. She was told to decrease her oxygen  to as needed. Pt is not on oxygen  today.  She reports her breathing is stable, but SOB with exertion.   Has atherosclerosis and takes Crestor .    She is followed by Nephrologists as needed for CKD. Avoids NSAID's.   She is followed by Urologists for recurrent UTI's annually.  She has a wound on left lower leg. Has home health come once  a week.  Hypertension This is a chronic problem. The current episode started more than 1 year ago. The problem has been resolved since onset. The problem is controlled. Associated symptoms include anxiety, blurred vision, malaise/fatigue and shortness of breath. Pertinent negatives include no peripheral edema. Risk factors for coronary artery disease include dyslipidemia, diabetes mellitus, obesity, sedentary lifestyle and post-menopausal state. The current treatment provides moderate improvement. Identifiable causes of hypertension include a thyroid  problem.  Congestive Heart Failure Presents for follow-up visit. Associated symptoms include edema, fatigue and shortness of breath. The symptoms have been stable.  Gastroesophageal Reflux She complains of belching and heartburn. She reports no nausea. This is a chronic problem. The current episode started more than 1 year ago. The problem occurs occasionally. The symptoms are aggravated by certain foods. Associated symptoms include fatigue. Risk factors include obesity. She has tried a PPI for the symptoms. The treatment provided moderate relief.   Diabetes She presents for her follow-up diabetic visit. She has type 2 diabetes mellitus. Hypoglycemia symptoms include nervousness/anxiousness. Associated symptoms include blurred vision, fatigue and foot paresthesias. Symptoms are stable. 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 Risk factors for coronary artery disease include dyslipidemia, diabetes mellitus, hypertension, sedentary lifestyle and post-menopausal. She is following a generally unhealthy diet. (Not checking glucose at home)  Hyperlipidemia This is a chronic problem. The current episode started more than 1 year ago. The problem is controlled. Recent lipid tests were reviewed and are normal. Exacerbating diseases include obesity. Associated symptoms include shortness of breath. Current antihyperlipidemic treatment includes statins. The current treatment provides moderate improvement of lipids. Risk factors for coronary artery disease include dyslipidemia, diabetes mellitus, hypertension, a sedentary lifestyle, post-menopausal and obesity.  Thyroid  Problem Presents for follow-up visit. Symptoms include anxiety, constipation and fatigue. The symptoms have been stable.  Arthritis Presents for follow-up visit. She complains of pain and stiffness. Affected locations include the left knee, right knee, left MCP and right MCP. Her pain is at a severity of 5/10. Associated symptoms include dysuria and fatigue.  Depression        This is a chronic problem.  The current episode started more than 1 year ago.   The problem occurs intermittently.  Associated symptoms include fatigue, restlessness and sad.  Associated symptoms include no helplessness and no hopelessness.  Past treatments include nothing.  Past medical history includes thyroid  problem and anxiety.   Anxiety Presents for follow-up visit. Symptoms include excessive worry, nervous/anxious behavior, restlessness and shortness of breath. Patient reports no nausea.  Symptoms occur occasionally. The severity of symptoms is  moderate.    Constipation This is a chronic problem. The current episode started more than 1 year ago. The problem has been waxing and waning since onset. Her stool frequency is 1 time per day. Pertinent negatives include no nausea or vomiting. Risk factors include obesity. She has tried diet changes and stool softeners for the symptoms. The treatment provided moderate relief.  Dysuria  This is a recurrent problem. The current episode started 1 to 4 weeks ago. The problem occurs every urination. The problem has been gradually worsening. The quality of the pain is described as burning. The pain is at a severity of 10/10. The pain is moderate. Associated symptoms include flank pain, frequency, hesitancy and urgency. Pertinent negatives include no hematuria, nausea or vomiting. She has tried increased fluids for the symptoms. The treatment provided mild relief.  Vaginal Itching The patient's primary symptoms include genital itching. The patient's pertinent negatives include no genital odor. This is a recurrent problem. The pain is moderate. Associated symptoms include constipation, dysuria, flank pain, frequency and urgency. Pertinent negatives include no hematuria, nausea or vomiting.      Review of Systems  Constitutional:  Positive for fatigue and malaise/fatigue.  Eyes:  Positive for blurred vision.  Respiratory:  Positive for shortness of breath.   Gastrointestinal:  Positive for constipation and heartburn. Negative for nausea and vomiting.  Genitourinary:  Positive for dysuria, flank pain, frequency, hesitancy and urgency. Negative for hematuria.  Musculoskeletal:  Positive for stiffness.  Psychiatric/Behavioral:  The patient is nervous/anxious.   All other systems reviewed and are negative.  Family History  Problem Relation Age of Onset   Diabetes Other    Hypertension Other    Coronary artery disease Other    Stroke Other     Asthma Other    Kidney disease Sister    Cancer Sister        lung   Lung cancer Sister    Cancer Sister        lung   Lung cancer Sister    Cancer Sister        lung   Suicidality Son        committed suicide in 2009   CVA Son    Hyperlipidemia Other    Anxiety disorder Other    Migraines Other    Heart failure Father    Healthy Brother    Cancer Sister        lung   Healthy Sister    Social History   Socioeconomic History   Marital status: Widowed    Spouse name: Ajanee Buren   Number of children: 4   Years of education: 9   Highest education level: 9th grade  Occupational History   Occupation: retired    Comment: cna  Tobacco Use   Smoking status: Never    Passive exposure: Never   Smokeless tobacco: Never  Vaping Use   Vaping status: Never Used  Substance and Sexual Activity   Alcohol  use: No   Drug use: No   Sexual activity: Not Currently    Partners: Male    Birth control/protection: Surgical  Other Topics Concern   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   Pt lives alone in single wide trailer. They have 4 children and 13 grandchildren.    Social Drivers of Health   Financial Resource Strain: Low Risk  (12/29/2023)   Overall Financial  Resource Strain (CARDIA)    Difficulty of Paying Living Expenses: Not very hard  Food Insecurity: Food Insecurity Present (12/29/2023)   Hunger Vital Sign    Worried About Running Out of Food in the Last Year: Sometimes true    Ran Out of Food in the Last Year: Never true  Transportation Needs: Unmet Transportation Needs (12/29/2023)   PRAPARE - Administrator, Civil Service (Medical): Yes    Lack of Transportation (Non-Medical): No  Physical Activity: Inactive (12/29/2023)   Exercise Vital Sign    Days of Exercise per Week: 0 days    Minutes of Exercise per Session: Not on file  Stress: Stress Concern Present (12/29/2023)   [employer] - Occupational Stress Questionnaire    Feeling of Stress: To some extent  Social Connections: Socially Isolated (12/29/2023)   Social Connection and Isolation Panel    Frequency of Communication with Friends and Family: More than three times a week    Frequency of Social Gatherings with Friends and Family: More than three times a week    Attends Religious Services: Never    Database administrator or Organizations: No    Attends Banker Meetings: Not on file    Marital Status: Widowed        Objective:   Physical Exam Vitals reviewed.  Constitutional:      General: She is not in acute distress.    Appearance: She is well-developed. She is obese.  HENT:     Head: Normocephalic and atraumatic.     Right Ear: Tympanic membrane normal.     Left Ear: Tympanic membrane normal.  Eyes:     Pupils: Pupils are equal, round, and reactive to light.  Neck:     Thyroid : No thyromegaly.  Cardiovascular:     Rate and Rhythm: Normal rate and regular rhythm.     Heart sounds: Normal heart sounds. No murmur heard. Pulmonary:     Effort: Pulmonary effort is normal. No respiratory distress.     Breath sounds: Normal breath sounds. Decreased air movement present. No wheezing.  Abdominal:     General: Bowel sounds are normal. There is no distension.     Palpations: Abdomen is soft.     Tenderness: There is no abdominal tenderness.  Genitourinary:    Comments: Labia erythemas  Musculoskeletal:        General: No tenderness. Normal range of motion.     Cervical back: Normal range of motion and neck supple.     Right lower leg: Edema (2+) present.     Left lower leg: Edema (2+) present.  Skin:    General: Skin is warm and dry.     Findings: Wound present.         Comments: Scabbed lesion on left posterior calf that is approx 2X2  Erythemas yeast like rash in bilateral groin  Neurological:     Mental Status: She is alert and oriented to person,  place, and time.     Cranial Nerves: No cranial nerve deficit.     Deep Tendon Reflexes: Reflexes are normal and symmetric.  Psychiatric:        Behavior: Behavior normal.        Thought Content: Thought content normal.        Judgment: Judgment normal.        BP 102/63   Pulse 76   Temp 97.6 F (36.4 C) (Temporal)   Ht 5' 7 (  1.702 m)   BMI 28.79 kg/m   Assessment & Plan:  Lorraine Leonard comes in today with chief complaint of Medical Management of Chronic Issues (LEG IS HEALING GOOD)   Diagnosis and orders addressed: 1. Hypertension associated with diabetes (HCC) - atenolol  (TENORMIN ) 25 MG tablet; Take 1 tablet (25 mg total) by mouth daily.  Dispense: 90 tablet; Refill: 0 - CBC with Differential/Platelet - CMP14+EGFR  2. Iron  deficiency anemia due to chronic blood loss - Iron , Ferrous Sulfate , 325 (65 Fe) MG TABS; Take 325 mg by mouth daily.  Dispense: 90 tablet; Refill: 1 - CBC with Differential/Platelet - CMP14+EGFR  3. Type 2 diabetes mellitus with diabetic polyneuropathy, without long-term current use of insulin  (HCC) - metFORMIN  (GLUCOPHAGE -XR) 500 MG 24 hr tablet; Take 1 tablet (500 mg total) by mouth daily with breakfast.  Dispense: 90 tablet; Refill: 2 - Bayer DCA Hb A1c Waived - CBC with Differential/Platelet - CMP14+EGFR - Microalbumin / creatinine urine ratio  4. Gastroesophageal reflux disease, unspecified whether esophagitis present (Primary) - CBC with Differential/Platelet - CMP14+EGFR  5. Moderate episode of recurrent major depressive disorder (HCC) - CBC with Differential/Platelet - CMP14+EGFR  6. Hypothyroidism, unspecified type - TSH - CBC with Differential/Platelet - CMP14+EGFR  7. Anxiety  - CBC with Differential/Platelet - CMP14+EGFR  8. Vitamin D  deficiency  - CBC with Differential/Platelet - CMP14+EGFR  9. Hyperlipidemia associated with type 2 diabetes mellitus (HCC) - CBC with Differential/Platelet - CMP14+EGFR  10. Slow  transit constipation - CBC with Differential/Platelet - CMP14+EGFR  11. Obesity (BMI 30-39.9) - CBC with Differential/Platelet - CMP14+EGFR  12. Chronic obstructive pulmonary disease, unspecified COPD type (HCC) - CBC with Differential/Platelet - CMP14+EGFR  13. Dysuria - Urinalysis, Complete - Urine Culture - CBC with Differential/Platelet - CMP14+EGFR  14. Recurrent UTI - Urinalysis, Complete - Urine Culture - CBC with Differential/Platelet - CMP14+EGFR  15. Vagina itching  - WET PREP FOR TRICH, YEAST, CLUE  16. Candidal skin infection Keep clean and dry Pt wearing disposable underwear, make sure to change every time she soils  Nystatin powder and diflucan   Wet prep pending   - nystatin (MYCOSTATIN/NYSTOP) powder; Apply 1 Application topically 3 (three) times daily.  Dispense: 60 g; Refill: 1 - fluconazole  (DIFLUCAN ) 150 MG tablet; Take 1 tablet (150 mg total) by mouth every three (3) days as needed.  Dispense: 3 tablet; Refill: 0  17. Wound of left lower extremity, sequela Continue Home health and wrapping daily   Labs pending Continue current medications  Low salt diet  Wear compression hose  Health Maintenance reviewed Diet and exercise encouraged Approx 40 mins spent with patient, chart review, education   Follow up plan: 3 months    Bari Learn, FNP

## 2024-01-26 NOTE — Patient Instructions (Signed)

## 2024-01-27 LAB — CBC WITH DIFFERENTIAL/PLATELET
Basophils Absolute: 0 x10E3/uL (ref 0.0–0.2)
Basos: 0 %
EOS (ABSOLUTE): 0 x10E3/uL (ref 0.0–0.4)
Eos: 0 %
Hematocrit: 42.2 % (ref 34.0–46.6)
Hemoglobin: 13.4 g/dL (ref 11.1–15.9)
Immature Grans (Abs): 0 x10E3/uL (ref 0.0–0.1)
Immature Granulocytes: 0 %
Lymphocytes Absolute: 1.7 x10E3/uL (ref 0.7–3.1)
Lymphs: 16 %
MCH: 28.1 pg (ref 26.6–33.0)
MCHC: 31.8 g/dL (ref 31.5–35.7)
MCV: 89 fL (ref 79–97)
Monocytes Absolute: 0.7 x10E3/uL (ref 0.1–0.9)
Monocytes: 7 %
Neutrophils Absolute: 7.9 x10E3/uL — ABNORMAL HIGH (ref 1.4–7.0)
Neutrophils: 77 %
Platelets: 329 x10E3/uL (ref 150–450)
RBC: 4.77 x10E6/uL (ref 3.77–5.28)
RDW: 17.6 % — ABNORMAL HIGH (ref 11.7–15.4)
WBC: 10.5 x10E3/uL (ref 3.4–10.8)

## 2024-01-27 LAB — CMP14+EGFR
ALT: 19 IU/L (ref 0–32)
AST: 24 IU/L (ref 0–40)
Albumin: 4.2 g/dL (ref 3.7–4.7)
Alkaline Phosphatase: 51 IU/L (ref 48–129)
BUN/Creatinine Ratio: 19 (ref 12–28)
BUN: 42 mg/dL — AB (ref 8–27)
Bilirubin Total: 0.3 mg/dL (ref 0.0–1.2)
CO2: 19 mmol/L — AB (ref 20–29)
Calcium: 10.5 mg/dL — AB (ref 8.7–10.3)
Chloride: 101 mmol/L (ref 96–106)
Creatinine, Ser: 2.2 mg/dL — AB (ref 0.57–1.00)
Globulin, Total: 2.9 g/dL (ref 1.5–4.5)
Glucose: 148 mg/dL — AB (ref 70–99)
Potassium: 5 mmol/L (ref 3.5–5.2)
Sodium: 139 mmol/L (ref 134–144)
Total Protein: 7.1 g/dL (ref 6.0–8.5)
eGFR: 22 mL/min/1.73 — AB (ref >59)

## 2024-01-27 LAB — MICROALBUMIN / CREATININE URINE RATIO
Creatinine, Urine: 87.6 mg/dL
Microalb/Creat Ratio: 164 mg/g{creat} — ABNORMAL HIGH (ref 0–29)
Microalbumin, Urine: 143.4 ug/mL

## 2024-01-27 LAB — URINE CULTURE

## 2024-01-27 LAB — TSH: TSH: 15 u[IU]/mL — AB (ref 0.450–4.500)

## 2024-01-28 ENCOUNTER — Ambulatory Visit: Payer: Self-pay | Admitting: Internal Medicine

## 2024-01-28 ENCOUNTER — Other Ambulatory Visit: Payer: Self-pay

## 2024-01-28 ENCOUNTER — Encounter (HOSPITAL_COMMUNITY): Payer: Self-pay

## 2024-01-28 ENCOUNTER — Inpatient Hospital Stay (HOSPITAL_COMMUNITY)
Admission: EM | Admit: 2024-01-28 | Discharge: 2024-01-30 | DRG: 683 | Disposition: A | Discharge status: Home / Self Care | Attending: Family Medicine | Admitting: Family Medicine

## 2024-01-28 ENCOUNTER — Emergency Department (HOSPITAL_COMMUNITY)

## 2024-01-28 ENCOUNTER — Ambulatory Visit

## 2024-01-28 ENCOUNTER — Ambulatory Visit: Payer: Self-pay | Admitting: Family

## 2024-01-28 DIAGNOSIS — E1142 Type 2 diabetes mellitus with diabetic polyneuropathy: Secondary | ICD-10-CM | POA: Diagnosis not present

## 2024-01-28 DIAGNOSIS — H3530 Unspecified macular degeneration: Secondary | ICD-10-CM | POA: Diagnosis not present

## 2024-01-28 DIAGNOSIS — Z9089 Acquired absence of other organs: Secondary | ICD-10-CM | POA: Diagnosis not present

## 2024-01-28 DIAGNOSIS — Z8709 Personal history of other diseases of the respiratory system: Secondary | ICD-10-CM

## 2024-01-28 DIAGNOSIS — Z9842 Cataract extraction status, left eye: Secondary | ICD-10-CM

## 2024-01-28 DIAGNOSIS — F419 Anxiety disorder, unspecified: Secondary | ICD-10-CM | POA: Diagnosis present

## 2024-01-28 DIAGNOSIS — Z7984 Long term (current) use of oral hypoglycemic drugs: Secondary | ICD-10-CM

## 2024-01-28 DIAGNOSIS — F32A Depression, unspecified: Secondary | ICD-10-CM | POA: Diagnosis present

## 2024-01-28 DIAGNOSIS — I10 Essential (primary) hypertension: Secondary | ICD-10-CM | POA: Diagnosis not present

## 2024-01-28 DIAGNOSIS — Z90710 Acquired absence of both cervix and uterus: Secondary | ICD-10-CM

## 2024-01-28 DIAGNOSIS — Z833 Family history of diabetes mellitus: Secondary | ICD-10-CM

## 2024-01-28 DIAGNOSIS — D631 Anemia in chronic kidney disease: Secondary | ICD-10-CM

## 2024-01-28 DIAGNOSIS — Z9981 Dependence on supplemental oxygen: Secondary | ICD-10-CM | POA: Diagnosis not present

## 2024-01-28 DIAGNOSIS — E1122 Type 2 diabetes mellitus with diabetic chronic kidney disease: Secondary | ICD-10-CM | POA: Diagnosis not present

## 2024-01-28 DIAGNOSIS — I4821 Permanent atrial fibrillation: Secondary | ICD-10-CM | POA: Diagnosis not present

## 2024-01-28 DIAGNOSIS — B359 Dermatophytosis, unspecified: Secondary | ICD-10-CM | POA: Diagnosis present

## 2024-01-28 DIAGNOSIS — Z9841 Cataract extraction status, right eye: Secondary | ICD-10-CM

## 2024-01-28 DIAGNOSIS — J449 Chronic obstructive pulmonary disease, unspecified: Secondary | ICD-10-CM | POA: Diagnosis present

## 2024-01-28 DIAGNOSIS — Z8679 Personal history of other diseases of the circulatory system: Secondary | ICD-10-CM | POA: Diagnosis not present

## 2024-01-28 DIAGNOSIS — N3001 Acute cystitis with hematuria: Secondary | ICD-10-CM | POA: Diagnosis not present

## 2024-01-28 DIAGNOSIS — Z950 Presence of cardiac pacemaker: Secondary | ICD-10-CM

## 2024-01-28 DIAGNOSIS — Z961 Presence of intraocular lens: Secondary | ICD-10-CM | POA: Diagnosis present

## 2024-01-28 DIAGNOSIS — Y830 Surgical operation with transplant of whole organ as the cause of abnormal reaction of the patient, or of later complication, without mention of misadventure at the time of the procedure: Secondary | ICD-10-CM | POA: Diagnosis present

## 2024-01-28 DIAGNOSIS — Z8419 Family history of other disorders of kidney and ureter: Secondary | ICD-10-CM

## 2024-01-28 DIAGNOSIS — J8410 Pulmonary fibrosis, unspecified: Secondary | ICD-10-CM | POA: Diagnosis not present

## 2024-01-28 DIAGNOSIS — K5730 Diverticulosis of large intestine without perforation or abscess without bleeding: Secondary | ICD-10-CM | POA: Diagnosis not present

## 2024-01-28 DIAGNOSIS — I5032 Chronic diastolic (congestive) heart failure: Secondary | ICD-10-CM | POA: Diagnosis present

## 2024-01-28 DIAGNOSIS — Z7901 Long term (current) use of anticoagulants: Secondary | ICD-10-CM | POA: Diagnosis not present

## 2024-01-28 DIAGNOSIS — N183 Chronic kidney disease, stage 3 unspecified: Secondary | ICD-10-CM

## 2024-01-28 DIAGNOSIS — I4891 Unspecified atrial fibrillation: Secondary | ICD-10-CM

## 2024-01-28 DIAGNOSIS — I130 Hypertensive heart and chronic kidney disease with heart failure and stage 1 through stage 4 chronic kidney disease, or unspecified chronic kidney disease: Secondary | ICD-10-CM | POA: Diagnosis not present

## 2024-01-28 DIAGNOSIS — E039 Hypothyroidism, unspecified: Secondary | ICD-10-CM | POA: Diagnosis not present

## 2024-01-28 DIAGNOSIS — Z79899 Other long term (current) drug therapy: Secondary | ICD-10-CM | POA: Diagnosis not present

## 2024-01-28 DIAGNOSIS — E119 Type 2 diabetes mellitus without complications: Secondary | ICD-10-CM

## 2024-01-28 DIAGNOSIS — M069 Rheumatoid arthritis, unspecified: Secondary | ICD-10-CM

## 2024-01-28 DIAGNOSIS — N184 Chronic kidney disease, stage 4 (severe): Secondary | ICD-10-CM | POA: Diagnosis present

## 2024-01-28 DIAGNOSIS — N3090 Cystitis, unspecified without hematuria: Secondary | ICD-10-CM | POA: Insufficient documentation

## 2024-01-28 DIAGNOSIS — S81802D Unspecified open wound, left lower leg, subsequent encounter: Secondary | ICD-10-CM

## 2024-01-28 DIAGNOSIS — E872 Acidosis, unspecified: Secondary | ICD-10-CM | POA: Diagnosis not present

## 2024-01-28 DIAGNOSIS — N1831 Chronic kidney disease, stage 3a: Secondary | ICD-10-CM

## 2024-01-28 DIAGNOSIS — Z886 Allergy status to analgesic agent status: Secondary | ICD-10-CM

## 2024-01-28 DIAGNOSIS — R040 Epistaxis: Secondary | ICD-10-CM | POA: Diagnosis not present

## 2024-01-28 DIAGNOSIS — K219 Gastro-esophageal reflux disease without esophagitis: Secondary | ICD-10-CM | POA: Diagnosis present

## 2024-01-28 DIAGNOSIS — Z905 Acquired absence of kidney: Secondary | ICD-10-CM

## 2024-01-28 DIAGNOSIS — I442 Atrioventricular block, complete: Secondary | ICD-10-CM | POA: Diagnosis present

## 2024-01-28 DIAGNOSIS — Z556 Problems related to health literacy: Secondary | ICD-10-CM | POA: Diagnosis not present

## 2024-01-28 DIAGNOSIS — B372 Candidiasis of skin and nail: Secondary | ICD-10-CM | POA: Diagnosis present

## 2024-01-28 DIAGNOSIS — M545 Low back pain, unspecified: Secondary | ICD-10-CM | POA: Diagnosis present

## 2024-01-28 DIAGNOSIS — Z825 Family history of asthma and other chronic lower respiratory diseases: Secondary | ICD-10-CM

## 2024-01-28 DIAGNOSIS — T8619 Other complication of kidney transplant: Secondary | ICD-10-CM | POA: Diagnosis present

## 2024-01-28 DIAGNOSIS — L97929 Non-pressure chronic ulcer of unspecified part of left lower leg with unspecified severity: Secondary | ICD-10-CM | POA: Diagnosis not present

## 2024-01-28 DIAGNOSIS — N3289 Other specified disorders of bladder: Secondary | ICD-10-CM | POA: Diagnosis not present

## 2024-01-28 DIAGNOSIS — J9611 Chronic respiratory failure with hypoxia: Secondary | ICD-10-CM | POA: Diagnosis not present

## 2024-01-28 DIAGNOSIS — I5031 Acute diastolic (congestive) heart failure: Secondary | ICD-10-CM

## 2024-01-28 DIAGNOSIS — G8929 Other chronic pain: Secondary | ICD-10-CM | POA: Diagnosis present

## 2024-01-28 DIAGNOSIS — H409 Unspecified glaucoma: Secondary | ICD-10-CM | POA: Diagnosis present

## 2024-01-28 DIAGNOSIS — M159 Polyosteoarthritis, unspecified: Secondary | ICD-10-CM | POA: Diagnosis present

## 2024-01-28 DIAGNOSIS — Z881 Allergy status to other antibiotic agents status: Secondary | ICD-10-CM

## 2024-01-28 DIAGNOSIS — E785 Hyperlipidemia, unspecified: Secondary | ICD-10-CM | POA: Diagnosis present

## 2024-01-28 DIAGNOSIS — E1140 Type 2 diabetes mellitus with diabetic neuropathy, unspecified: Secondary | ICD-10-CM | POA: Diagnosis not present

## 2024-01-28 DIAGNOSIS — N179 Acute kidney failure, unspecified: Principal | ICD-10-CM | POA: Diagnosis present

## 2024-01-28 DIAGNOSIS — Z888 Allergy status to other drugs, medicaments and biological substances status: Secondary | ICD-10-CM

## 2024-01-28 DIAGNOSIS — G4733 Obstructive sleep apnea (adult) (pediatric): Secondary | ICD-10-CM | POA: Diagnosis present

## 2024-01-28 DIAGNOSIS — R339 Retention of urine, unspecified: Secondary | ICD-10-CM | POA: Diagnosis present

## 2024-01-28 DIAGNOSIS — Z79890 Hormone replacement therapy (postmenopausal): Secondary | ICD-10-CM

## 2024-01-28 DIAGNOSIS — N390 Urinary tract infection, site not specified: Secondary | ICD-10-CM | POA: Diagnosis not present

## 2024-01-28 DIAGNOSIS — Z7952 Long term (current) use of systemic steroids: Secondary | ICD-10-CM

## 2024-01-28 DIAGNOSIS — Z8249 Family history of ischemic heart disease and other diseases of the circulatory system: Secondary | ICD-10-CM

## 2024-01-28 DIAGNOSIS — M109 Gout, unspecified: Secondary | ICD-10-CM | POA: Diagnosis not present

## 2024-01-28 LAB — CBC WITH DIFFERENTIAL/PLATELET
Abs Immature Granulocytes: 0.04 K/uL (ref 0.00–0.07)
Basophils Absolute: 0.1 K/uL (ref 0.0–0.1)
Basophils Relative: 1 %
Eosinophils Absolute: 0.1 K/uL (ref 0.0–0.5)
Eosinophils Relative: 2 %
HCT: 43.8 % (ref 36.0–46.0)
Hemoglobin: 13.8 g/dL (ref 12.0–15.0)
Immature Granulocytes: 0 %
Lymphocytes Relative: 28 %
Lymphs Abs: 2.5 K/uL (ref 0.7–4.0)
MCH: 27.8 pg (ref 26.0–34.0)
MCHC: 31.5 g/dL (ref 30.0–36.0)
MCV: 88.3 fL (ref 80.0–100.0)
Monocytes Absolute: 1 K/uL (ref 0.1–1.0)
Monocytes Relative: 11 %
Neutro Abs: 5.4 K/uL (ref 1.7–7.7)
Neutrophils Relative %: 58 %
Platelets: 295 K/uL (ref 150–400)
RBC: 4.96 MIL/uL (ref 3.87–5.11)
RDW: 19.1 % — ABNORMAL HIGH (ref 11.5–15.5)
WBC: 9.2 K/uL (ref 4.0–10.5)
nRBC: 0 % (ref 0.0–0.2)

## 2024-01-28 LAB — URINALYSIS, ROUTINE W REFLEX MICROSCOPIC
Bilirubin Urine: NEGATIVE
Glucose, UA: NEGATIVE mg/dL
Ketones, ur: NEGATIVE mg/dL
Nitrite: NEGATIVE
Protein, ur: 100 mg/dL — AB
RBC / HPF: >50 RBC/hpf (ref 0–5)
Specific Gravity, Urine: 1.008 (ref 1.005–1.030)
WBC, UA: >50 WBC/hpf (ref 0–5)
pH: 6 (ref 5.0–8.0)

## 2024-01-28 LAB — COMPREHENSIVE METABOLIC PANEL WITH GFR
ALT: 20 U/L (ref 0–44)
AST: 30 U/L (ref 15–41)
Albumin: 4.1 g/dL (ref 3.5–5.0)
Alkaline Phosphatase: 51 U/L (ref 38–126)
Anion gap: 19 — ABNORMAL HIGH (ref 5–15)
BUN: 56 mg/dL — ABNORMAL HIGH (ref 8–23)
CO2: 19 mmol/L — ABNORMAL LOW (ref 22–32)
Calcium: 10.4 mg/dL — ABNORMAL HIGH (ref 8.9–10.3)
Chloride: 101 mmol/L (ref 98–111)
Creatinine, Ser: 2.59 mg/dL — ABNORMAL HIGH (ref 0.44–1.00)
GFR, Estimated: 18 mL/min — ABNORMAL LOW (ref >60)
Glucose, Bld: 122 mg/dL — ABNORMAL HIGH (ref 70–99)
Potassium: 5 mmol/L (ref 3.5–5.1)
Sodium: 139 mmol/L (ref 135–145)
Total Bilirubin: 0.3 mg/dL (ref 0.0–1.2)
Total Protein: 7.3 g/dL (ref 6.5–8.1)

## 2024-01-28 LAB — GLUCOSE, CAPILLARY: Glucose-Capillary: 116 mg/dL — ABNORMAL HIGH (ref 70–99)

## 2024-01-28 MED ORDER — AMLODIPINE BESYLATE 5 MG PO TABS
2.5000 mg | ORAL_TABLET | Freq: Every day | ORAL | Status: DC
  Administered 2024-01-29: 2.5 mg via ORAL
  Filled 2024-01-28: qty 1

## 2024-01-28 MED ORDER — ACETAMINOPHEN 325 MG PO TABS: 650.0000 mg | ORAL_TABLET | Freq: Four times a day (QID) | ORAL | Status: DC | PRN

## 2024-01-28 MED ORDER — APIXABAN 2.5 MG PO TABS
2.5000 mg | ORAL_TABLET | Freq: Two times a day (BID) | ORAL | Status: DC
  Administered 2024-01-29 – 2024-01-30 (×3): 2.5 mg via ORAL
  Filled 2024-01-28 (×4): qty 1

## 2024-01-28 MED ORDER — LACTATED RINGERS IV SOLN: INTRAVENOUS | Status: AC

## 2024-01-28 MED ORDER — HEPARIN SODIUM (PORCINE) 5000 UNIT/ML IJ SOLN: 5000.0000 [IU] | Freq: Three times a day (TID) | INTRAMUSCULAR | Status: DC

## 2024-01-28 MED ORDER — PREDNISONE 5 MG PO TABS
5.0000 mg | ORAL_TABLET | Freq: Two times a day (BID) | ORAL | Status: DC
  Administered 2024-01-29 – 2024-01-30 (×2): 5 mg via ORAL
  Filled 2024-01-28 (×2): qty 1

## 2024-01-28 MED ORDER — ACETAMINOPHEN 650 MG RE SUPP: 650.0000 mg | Freq: Four times a day (QID) | RECTAL | Status: DC | PRN

## 2024-01-28 MED ORDER — LEVOTHYROXINE SODIUM 100 MCG PO TABS
100.0000 ug | ORAL_TABLET | Freq: Every day | ORAL | Status: DC
  Administered 2024-01-29 – 2024-01-30 (×2): 100 ug via ORAL
  Filled 2024-01-28 (×2): qty 1

## 2024-01-28 MED ORDER — LACTATED RINGERS IV SOLN: INTRAVENOUS | Status: DC

## 2024-01-28 MED ORDER — SODIUM CHLORIDE 0.9 % IV SOLN
1.0000 g | INTRAVENOUS | Status: DC
  Administered 2024-01-29: 1 g via INTRAVENOUS
  Filled 2024-01-28 (×2): qty 10

## 2024-01-28 MED ORDER — SODIUM CHLORIDE 0.9 % IV BOLUS
1000.0000 mL | Freq: Once | INTRAVENOUS | Status: AC
  Administered 2024-01-28: 1000 mL via INTRAVENOUS

## 2024-01-28 MED ORDER — ALBUTEROL SULFATE HFA 108 (90 BASE) MCG/ACT IN AERS: 2.0000 | INHALATION_SPRAY | Freq: Four times a day (QID) | RESPIRATORY_TRACT | Status: DC | PRN

## 2024-01-28 MED ORDER — SODIUM CHLORIDE 0.9 % IV SOLN
1.0000 g | Freq: Once | INTRAVENOUS | Status: AC
  Administered 2024-01-28: 1 g via INTRAVENOUS
  Filled 2024-01-28: qty 10

## 2024-01-28 MED ORDER — ATENOLOL 25 MG PO TABS
25.0000 mg | ORAL_TABLET | Freq: Every day | ORAL | Status: DC
  Administered 2024-01-29 – 2024-01-30 (×2): 25 mg via ORAL
  Filled 2024-01-28 (×2): qty 1

## 2024-01-28 MED ORDER — ONDANSETRON HCL 4 MG/2ML IJ SOLN: 4.0000 mg | Freq: Four times a day (QID) | INTRAMUSCULAR | Status: DC | PRN

## 2024-01-28 MED ORDER — ALBUTEROL SULFATE (2.5 MG/3ML) 0.083% IN NEBU: 2.5000 mg | INHALATION_SOLUTION | Freq: Four times a day (QID) | RESPIRATORY_TRACT | Status: DC | PRN

## 2024-01-28 MED ORDER — INSULIN ASPART 100 UNIT/ML IJ SOLN
0.0000 [IU] | Freq: Three times a day (TID) | INTRAMUSCULAR | Status: DC
  Administered 2024-01-29 – 2024-01-30 (×2): 1 [IU] via SUBCUTANEOUS

## 2024-01-28 MED ORDER — NYSTATIN 100000 UNIT/GM EX CREA
TOPICAL_CREAM | Freq: Two times a day (BID) | CUTANEOUS | Status: DC
  Filled 2024-01-28 (×2): qty 15

## 2024-01-28 MED ORDER — OXYCODONE HCL 5 MG PO TABS
5.0000 mg | ORAL_TABLET | ORAL | Status: DC | PRN
  Administered 2024-01-28 – 2024-01-29 (×2): 5 mg via ORAL
  Filled 2024-01-28 (×2): qty 1

## 2024-01-28 MED ORDER — ONDANSETRON HCL 4 MG PO TABS: 4.0000 mg | ORAL_TABLET | Freq: Four times a day (QID) | ORAL | Status: DC | PRN

## 2024-01-28 MED ORDER — SODIUM CHLORIDE 0.9 % IV SOLN: INTRAVENOUS | Status: DC

## 2024-01-28 MED ORDER — PANTOPRAZOLE SODIUM 40 MG PO TBEC
40.0000 mg | DELAYED_RELEASE_TABLET | Freq: Every day | ORAL | Status: DC
  Administered 2024-01-29 – 2024-01-30 (×2): 40 mg via ORAL
  Filled 2024-01-28 (×2): qty 1

## 2024-01-28 NOTE — ED Triage Notes (Signed)
 Pt arrived via [facility] from home c/o urinary retention, Pt reports receiving a call from her PCP that she is in renal failure and Pt reports she only has 1 kidney. Pt reports urinary retention/ incontinence X  3 days and is unsure if her PCP started her on antibiotics for a UTI.

## 2024-01-28 NOTE — ED Provider Notes (Signed)
 [facility] AT [facility] Provider Note   CSN: [id number] Arrival date & time: 01/28/24  8180     Patient presents with: Urinary Retention   Lorraine Leonard is a 83 y.o. female.   HPI   This patient is a 83 year old female, she has a history of cystitis and had been seen in the ER approximately 2 weeks ago, was placed on antibiotics, was seen for a multitude of complaints a couple of days ago at the doctor's office and ultimately had labs done which showed that she had an acute kidney injury with a creatinine of 2.2 where her baseline is 1.1.  Urinalysis that was performed in July showed no significant abnormal findings, the urinalysis performed a couple of days ago had positive nitrite but the culture grew less than 10,000 colony-forming units.  The patient complains of some lower abdominal discomfort in the pelvic region, severe burning with urination, she states it hurts and burns even when she does not urinate, there was a wet prep done that was negative as well.  She denies fevers vomiting or diarrhea  Prior to Admission medications   Medication Sig Start Date End Date Taking? Authorizing Provider  acetaminophen  (TYLENOL ) 325 MG tablet Take 2 tablets (650 mg total) by mouth every 6 (six) hours as needed for mild pain (pain score 1-3) or fever (or Fever >/= 101). 11/26/23   Emokpae, Courage, MD  albuterol  (VENTOLIN  HFA) 108 (90 Base) MCG/ACT inhaler INHALE 2 PUFFS INTO THE LUNGS EVERY 6 HOURS AS NEEDED FOR WHEEZE OR SHORTNESS OF BREATH 11/26/23   Pearlean Manus, MD  amLODipine  (NORVASC ) 5 MG tablet Take 0.5 tablets (2.5 mg total) by mouth daily. 11/26/23   Pearlean Manus, MD  apixaban  (ELIQUIS ) 5 MG TABS tablet Take 1 tablet (5 mg total) by mouth 2 (two) times daily. 01/26/24   Lavell Lye A, FNP  atenolol  (TENORMIN ) 25 MG tablet Take 1 tablet (25 mg total) by mouth daily. 01/26/24   Lavell Lye LABOR, FNP  calcium  citrate (CALCITRATE - DOSED IN MG ELEMENTAL  CALCIUM ) 950 MG tablet Take 1 tablet by mouth daily.    [provider]  cephALEXin  (KEFLEX ) 250 MG capsule Take 250 mg by mouth daily. 01/24/24   [provider]  cyanocobalamin  1000 MCG tablet Take 1,000 mcg by mouth daily. Vitamin b12 every AM    [provider]  fluconazole  (DIFLUCAN ) 150 MG tablet Take 1 tablet (150 mg total) by mouth every three (3) days as needed. 01/26/24   Hawks, Lye A, FNP  fluticasone  (FLONASE ) 50 MCG/ACT nasal spray PLACE 1 SPRAY INTO BOTH NOSTRILS 2 (TWO) TIMES DAILY AS NEEDED FOR ALLERGIES OR RHINITIS. Patient taking differently: Place 1 spray into both nostrils in the morning and at bedtime. 09/16/23   Lavell Lye LABOR, FNP  furosemide  (LASIX ) 40 MG tablet Increase to 80 mg for 3 days then resume 40 mg daily 12/10/23   Lavell Lye A, FNP  Iron , Ferrous Sulfate , 325 (65 Fe) MG TABS Take 325 mg by mouth daily. 01/26/24   Lavell Lye A, FNP  levocetirizine (XYZAL ) 5 MG tablet TAKE 1 TABLET BY MOUTH EVERY DAY IN THE EVENING Patient taking differently: Take 5 mg by mouth every evening. 08/25/23   Lavell Lye LABOR, FNP  levothyroxine  (SYNTHROID ) 100 MCG tablet TAKE 1 TABLET BY MOUTH EVERY DAY 11/05/23   Lavell Lye A, FNP  metFORMIN  (GLUCOPHAGE -XR) 500 MG 24 hr tablet Take 1 tablet (500 mg total) by mouth daily  with breakfast. 01/26/24   Lavell Bari LABOR, FNP  Multiple Vitamins-Minerals (PRESERVISION AREDS 2) CAPS Take 1 capsule by mouth 2 (two) times daily.    [provider]  nystatin (MYCOSTATIN/NYSTOP) powder Apply 1 Application topically 3 (three) times daily. 01/26/24   Lavell Bari A, FNP  Olopatadine  HCl 0.2 % SOLN INSTILL 1 DROP INTO BOTH EYES EVERY DAY 04/30/20   Lavell Bari A, FNP  omeprazole  (PRILOSEC) 20 MG capsule Take 1 capsule (20 mg total) by mouth daily. 06/05/23   Lavell Bari LABOR, FNP  Polyethyl Glycol-Propyl Glycol (SYSTANE) 0.4-0.3 % GEL ophthalmic gel Place 1 application into both eyes at bedtime as needed (dry  eyes).    [provider]  predniSONE  (DELTASONE ) 5 MG tablet TAKE 1 TABLET (5 MG TOTAL) BY MOUTH 2 (TWO) TIMES DAILY WITH A MEAL. 06/18/23   Lavell Bari A, FNP  vitamin C  (ASCORBIC ACID ) 500 MG tablet Take 1,000 mg by mouth daily. AM    [provider]  Vitamin D , Ergocalciferol , (DRISDOL ) 1.25 MG (50000 UNIT) CAPS capsule TAKE 1 CAPSULE (50,000 UNITS TOTAL) BY MOUTH EVERY 7 (SEVEN) DAYS 09/26/22   Lavell Bari LABOR, FNP    Allergies: Macrodantin  [nitrofurantoin  macrocrystal], Mobic  [meloxicam ], and Allopurinol     Review of Systems  All other systems reviewed and are negative.   Updated Vital Signs BP 110/76   Pulse 76   Temp 97.9 F (36.6 C) (Oral)   Resp 18   Ht 1.702 m (5' 7)   Wt 80.7 kg   SpO2 98%   BMI 27.88 kg/m   Physical Exam Vitals and nursing note reviewed.  Constitutional:      General: She is not in acute distress.    Appearance: She is well-developed.  HENT:     Head: Normocephalic and atraumatic.     Mouth/Throat:     Pharynx: No oropharyngeal exudate.  Eyes:     General: No scleral icterus.       Right eye: No discharge.        Left eye: No discharge.     Conjunctiva/sclera: Conjunctivae normal.     Pupils: Pupils are equal, round, and reactive to light.  Neck:     Thyroid : No thyromegaly.     Vascular: No JVD.  Cardiovascular:     Rate and Rhythm: Normal rate and regular rhythm.     Heart sounds: Normal heart sounds. No murmur heard.    No friction rub. No gallop.  Pulmonary:     Effort: Pulmonary effort is normal. No respiratory distress.     Breath sounds: Normal breath sounds. No wheezing or rales.  Abdominal:     General: Bowel sounds are normal. There is no distension.     Palpations: Abdomen is soft. There is no mass.     Tenderness: There is abdominal tenderness.     Comments: Minimal suprapubic discomfort, no masses palpated  Musculoskeletal:        General: No tenderness. Normal range of motion.     Cervical back:  Normal range of motion and neck supple.     Right lower leg: No edema.     Left lower leg: No edema.  Lymphadenopathy:     Cervical: No cervical adenopathy.  Skin:    General: Skin is warm and dry.     Findings: No erythema or rash.  Neurological:     Mental Status: She is alert.     Coordination: Coordination normal.  Psychiatric:  Behavior: Behavior normal.     (all labs ordered are listed, but only abnormal results are displayed) Labs Reviewed  COMPREHENSIVE METABOLIC PANEL WITH GFR - Abnormal; Notable for the following components:      Result Value   CO2 19 (*)    Glucose, Bld 122 (*)    BUN 56 (*)    Creatinine, Ser 2.59 (*)    Calcium  10.4 (*)    GFR, Estimated 18 (*)    Anion gap 19 (*)    All other components within normal limits  CBC WITH DIFFERENTIAL/PLATELET - Abnormal; Notable for the following components:   RDW 19.1 (*)    All other components within normal limits  URINALYSIS, ROUTINE W REFLEX MICROSCOPIC - Abnormal; Notable for the following components:   APPearance HAZY (*)    Hgb urine dipstick LARGE (*)    Protein, ur 100 (*)    Leukocytes,Ua SMALL (*)    Bacteria, UA FEW (*)    All other components within normal limits  URINE CULTURE    EKG: None  Radiology: CT ABDOMEN PELVIS WO CONTRAST Result Date: 01/28/2024 CLINICAL DATA:  Right lower quadrant pain EXAM: CT ABDOMEN AND PELVIS WITHOUT CONTRAST TECHNIQUE: Multidetector CT imaging of the abdomen and pelvis was performed following the standard protocol without IV contrast. RADIATION DOSE REDUCTION: This exam was performed according to the departmental dose-optimization program which includes automated exposure control, adjustment of the mA and/or kV according to patient size and/or use of iterative reconstruction technique. COMPARISON:  12/15/2017 FINDINGS: Lower chest: Lung bases demonstrate some mild scarring. Small nodule is noted in the left lower lobe which appears new from the prior exam.  This is best visualized on image number 31 of series 4 and measures approximately 4 mm. Hepatobiliary: No focal liver abnormality is seen. No gallstones, gallbladder wall thickening, or biliary dilatation. Pancreas: Unremarkable. No pancreatic ductal dilatation or surrounding inflammatory changes. Spleen: Normal in size without focal abnormality. Adrenals/Urinary Tract: Adrenal glands are within normal limits. 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 Right kidney is well visualized and shows no renal calculi. Mild fullness of the collecting system is noted without definitive obstructing stone. The bladder is partially distended. Some wall thickening and mild Peri vesicular inflammatory changes are noted which may represent an underlying UTI or cystitis. Stomach/Bowel: Scattered diverticular change of the colon is noted. No obstructive changes are seen. The appendix is not visualized. No inflammatory changes to suggest appendicitis are seen. Small bowel and stomach are within normal limits. Vascular/Lymphatic: Aortic atherosclerosis. No enlarged abdominal or pelvic lymph nodes. Reproductive: Status post hysterectomy. No adnexal masses. Other: No abdominal wall hernia or abnormality. No abdominopelvic ascites. Musculoskeletal: Degenerative changes and postsurgical changes in the lumbar spine are noted. IMPRESSION: Mild fullness of the right renal collecting system without definitive obstructive stone. This may be related to edema from recently passed stone or possibly related to the inflammatory changes in the bladder. Inflammatory changes and mild wall thickening in the bladder which may represent underlying UTI or cystitis. Correlate with laboratory values. Diverticulosis without diverticulitis. Less than 6 mm left solid pulmonary nodule. Per Fleischner Society Guidelines, no routine follow-up imaging is recommended. These guidelines do not apply to immunocompromised patients and patients  with cancer. Follow up in patients with significant comorbidities as clinically warranted. For lung cancer screening, adhere to Lung-RADS guidelines. Reference: Radiology. 2017; 284(1):228-43. Electronically Signed   By: [name] Devonshire M.D.   On: 01/28/2024 19:42  Procedures   Medications Ordered in the ED  0.9 %  sodium chloride  infusion ( Intravenous New Bag/Given 01/28/24 1909)  nystatin cream (MYCOSTATIN) (has no administration in time range)  cefTRIAXone  (ROCEPHIN ) 1 g in sodium chloride  0.9 % 100 mL IVPB (1 g Intravenous New Bag/Given 01/28/24 2005)  sodium chloride  0.9 % bolus 1,000 mL (1,000 mLs Intravenous New Bag/Given 01/28/24 2004)    Clinical Course as of 01/28/24 2010  Thu Jan 28, 2024  8061 Urinalysis reveals greater than 50 white blood cells and greater than 50 red blood cells, nitrite negative [BM]    Clinical Course User Index [BM] Cleotilde Rogue, MD                                 Medical Decision Making Amount and/or Complexity of Data Reviewed Labs: ordered. Radiology: ordered.  Risk Prescription drug management. Decision regarding hospitalization.    This patient presents to the ED for concern of lower abdominal discomfort, this involves an extensive number of treatment options, and is a complaint that carries with it a high risk of complications and morbidity.  The differential diagnosis includes cystitis, urinary retention, mass, tumor, obstruction, obstructive uropathy, cancer   Co morbidities / Chronic conditions that complicate the patient evaluation  Hypothyroidism   Additional history obtained:  Additional history obtained from EMR External records from outside source obtained and reviewed including prior labs, this also showed that her TSH was undetectably high   Lab Tests:  I Ordered, and personally interpreted labs.  The pertinent results include: AKI, creatinine of 2.6, urine Alysis with white blood cells and red blood cells, no  leukocytosis   Imaging Studies ordered:  I ordered imaging studies including CT scan of the abdomen and pelvis I independently visualized and interpreted imaging which showed a fullness of the right renal collecting system without obstruction, diverticulosis, left kidney surgically removed for prior transplant right kidney is well-visualized I agree with the radiologist interpretation   Cardiac Monitoring: / EKG:  The patient was maintained on a cardiac monitor.  I personally viewed and interpreted the cardiac monitored which showed an underlying rhythm of: Sinus rhythm   Problem List / ED Course / Critical interventions / Medication management  Urinalysis reveals likely UTI, white blood cells, red blood cells, acute kidney injury, IV fluids ordered I ordered medication including Rocephin , fluid bolus Reevaluation of the patient after these medicines showed that the patient slight improvement I have reviewed the patients home medicines and have made adjustments as needed   Consultations Obtained:  I requested consultation with the hospitalist Dr. Manfred,  and discussed lab and imaging findings as well as pertinent plan - they recommend: Admission to the hospital   Social Determinants of Health:  Status post kidney transplant   Test / Admission - Considered:  Admit for UTI with AKI `     Final diagnoses:  AKI (acute kidney injury)  Acute cystitis with hematuria    ED Discharge Orders     None          Cleotilde Rogue, MD 01/28/24 2010

## 2024-01-28 NOTE — H&P (Incomplete)
 History and Physical    Patient: Lorraine Leonard FMW:[id number] DOB: Dec 23, 1940 DOA: 01/28/2024 DOS: the patient was seen and examined on 01/28/2024 PCP: Lavell Bari LABOR, FNP  Patient coming from: Home  Chief Complaint:  Chief Complaint  Patient presents with   Urinary Retention   HPI: Lorraine Leonard is an 83 y.o. female with medical history significant of hypertension, hyperlipidemia, hypothyroidism, T2DM, chronic diastolic CHF, anxiety, depression, permanent atrial fibrillation, COPD, GERD, sleep apnea on nasal cannula at bedtime who presents to the emergency department due to being asked to go to the ED by her PCP due to abnormal labs. Patient presented to the ED about 2 weeks ago, she was diagnosed to have cystitis, she was treated in the ED and discharged with a prescription for ciprofloxacin .  She presented to her PCPs office a few days ago, labs done showed creatinine of 2.2 (baseline creatinine at 1.1-1.2), urine culture grew less than 10,000 colony-forming units.  Patient complained of ongoing burning sensation and lower abdominal discomfort in the pelvic region in her urine since treatment from the ED.  She denies fever, nausea, vomiting, abdominal pain.  ED Course:  In the emergency department, BP was 127/57, other vital signs are within normal range.  Workup in the ED showed normal CBC and BMP except for bicarb of 19, blood glucose 122, BUN 56, creatinine 2.59, calcium  10.4, eGFR 18.  Urinalysis was positive for hematuria and leukocytosis. CT abdomen pelvis without contrast was suggestive of underlying UTI or cystitis. She was treated with IV ceftriaxone .  Starting treatment was given due to suspected yeast infection in the groin area.  Review of Systems: Review of systems as noted in the HPI. All other systems reviewed and are negative.   Past Medical History:  Diagnosis Date   Allergic rhinitis    Anxiety    Anxiety    Arthritis    ~ all my joints (05/18/2017)   Atrial  fibrillation (HCC) 09/2016   Atrial fibrillation with RVR (HCC) 05/18/2017   Chronic heart failure with preserved ejection fraction (HFpEF) (HCC)    Chronic kidney disease, stage 3a (HCC)    Chronic lower back pain    COPD (chronic obstructive pulmonary disease) (HCC)    Depression    Diverticulosis    GERD (gastroesophageal reflux disease)    Glaucoma    Gout    History of blood transfusion 1983   when I gave a 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   Hyperlipidemia    Hypertension    Hypothyroidism    Macular degeneration of both eyes    Neuropathy    On home oxygen  therapy    3L; 24/7 (05/18/2017)   Pre-diabetes    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   Pulmonary fibrosis (HCC)    Rheumatoid arthritis (HCC)    Sleep apnea    uses 3 liters of o2 24/7   Urticaria    Past Surgical History:  Procedure Laterality Date   AV NODE ABLATION  05/18/2017   AV NODE ABLATION N/A 05/18/2017   Procedure: AV NODE ABLATION;  Surgeon: Waddell Danelle ORN, MD;  Location: [facility];  Service: Cardiovascular;  Laterality: N/A;   BACK SURGERY     BREAST LUMPECTOMY Left    CARDIOVERSION N/A 01/30/2017   Procedure: CARDIOVERSION;  Surgeon: Delford Maude BROCKS, MD;  Location: [facility];  Service: Cardiovascular;  Laterality: N/A;   CARDIOVERSION N/A 04/09/2017   Procedure: CARDIOVERSION;  Surgeon: Debera Jayson MATSU, MD;  Location: [facility];  Service: Cardiovascular;  Laterality: N/A;   CATARACT EXTRACTION W/ INTRAOCULAR LENS  IMPLANT, BILATERAL Bilateral    DILATION AND CURETTAGE OF UTERUS     EUS N/A 10/03/2015   Procedure: UPPER ENDOSCOPIC ULTRASOUND (EUS) RADIAL;  Surgeon: Elsie Cree, MD;  Location: [facility];  Service: Endoscopy;  Laterality: N/A;   FINE NEEDLE ASPIRATION N/A 10/03/2015   Procedure: FINE NEEDLE ASPIRATION (FNA) RADIAL;  Surgeon: Elsie Cree, MD;  Location: [facility];  Service: Endoscopy;  Laterality: N/A;   INSERT / REPLACE / REMOVE PACEMAKER  05/18/2017    LUMBAR LAMINECTOMY  1995; 06/23/2008   L4-5/notes 08/20/2010   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 She has never been exposed to tobacco smoke. She has never used smokeless tobacco. She reports that she does not drink alcohol  and does not use drugs.   Allergies  Allergen Reactions   Macrodantin  [Nitrofurantoin  Macrocrystal] Other (See Comments)    Unknown    Mobic  [Meloxicam ] Swelling and Other (See Comments)    Feet and legs swell   Allopurinol  Swelling and Other (See Comments)    Feet and legs swell    Family History  Problem Relation Age of Onset   Diabetes Other    Hypertension Other    Coronary artery disease Other    Stroke Other    Asthma Other    Kidney disease Sister    Cancer Sister        lung   Lung cancer Sister    Cancer Sister        lung   Lung cancer Sister    Cancer Sister        lung   Suicidality Son        committed suicide in 2009   CVA Son    Hyperlipidemia Other    Anxiety disorder Other    Migraines Other    Heart failure Father    Healthy Brother    Cancer Sister        lung   Healthy Sister      Prior to Admission medications   Medication Sig Start Date End Date Taking? Authorizing Provider  acetaminophen  (TYLENOL ) 325 MG tablet Take 2 tablets (650 mg total) by mouth every 6 (six) hours as needed for mild pain (pain score 1-3) or fever (or Fever >/= 101). 11/26/23  Yes Emokpae, Courage, MD  albuterol  (VENTOLIN  HFA) 108 (90 Base) MCG/ACT inhaler INHALE 2 PUFFS INTO THE LUNGS EVERY 6 HOURS AS NEEDED FOR WHEEZE OR SHORTNESS OF BREATH Patient taking differently: Inhale 2 puffs into the lungs every 6 (six)  hours as needed for wheezing or shortness of breath. 11/26/23  Yes Pearlean Manus, MD  amLODipine  (NORVASC ) 5 MG tablet Take 0.5 tablets (2.5 mg total) by mouth daily. 11/26/23   Pearlean Manus, MD  apixaban  (ELIQUIS ) 5 MG TABS tablet Take 1 tablet (5 mg total) by mouth 2 (two) times daily. 01/26/24   Lavell Bari LABOR, FNP  atenolol  (TENORMIN ) 25 MG tablet Take 1 tablet (25 mg total) by mouth daily. 01/26/24   Lavell Bari LABOR, FNP  calcium  citrate (CALCITRATE - DOSED IN MG ELEMENTAL CALCIUM ) 950 MG tablet Take 1 tablet by mouth daily.    [provider]  cephALEXin  (KEFLEX ) 250 MG capsule Take 250 mg by mouth daily. 01/24/24   [provider]  cyanocobalamin  1000 MCG tablet Take 1,000 mcg by mouth daily. Vitamin b12 every AM    [provider]  fluconazole  (DIFLUCAN ) 150 MG tablet Take 1 tablet (150 mg total) by mouth every three (3) days as needed. 01/26/24   Hawks, Bari A, FNP  fluticasone  (FLONASE ) 50 MCG/ACT nasal spray PLACE 1 SPRAY INTO BOTH NOSTRILS 2 (TWO) TIMES DAILY AS NEEDED FOR ALLERGIES OR RHINITIS. Patient taking differently: Place 1 spray into both nostrils in the morning and at bedtime. 09/16/23   Lavell Bari LABOR, FNP  furosemide  (LASIX ) 40 MG tablet Increase to 80 mg for 3 days then resume 40 mg daily 12/10/23   Lavell Bari A, FNP  Iron , Ferrous Sulfate , 325 (65 Fe) MG TABS Take 325 mg by mouth daily. 01/26/24   Lavell Bari A, FNP  levocetirizine (XYZAL ) 5 MG tablet TAKE 1 TABLET BY MOUTH EVERY DAY IN THE EVENING Patient taking differently: Take 5 mg by mouth every evening. 08/25/23   Lavell Bari LABOR, FNP  levothyroxine  (SYNTHROID ) 100 MCG tablet TAKE 1 TABLET BY MOUTH EVERY DAY 11/05/23   Lavell Bari A, FNP  metFORMIN  (GLUCOPHAGE -XR) 500 MG 24 hr tablet Take 1 tablet (500 mg total) by mouth daily with breakfast. 01/26/24   Lavell Bari LABOR, FNP  Multiple Vitamins-Minerals (PRESERVISION AREDS 2) CAPS Take 1 capsule by mouth 2 (two) times daily.     [provider]  nystatin (MYCOSTATIN/NYSTOP) powder Apply 1 Application topically 3 (three) times daily. 01/26/24   Lavell Bari A, FNP  Olopatadine  HCl 0.2 % SOLN INSTILL 1 DROP INTO BOTH EYES EVERY DAY 04/30/20   Lavell Bari A, FNP  omeprazole  (PRILOSEC) 20 MG capsule Take 1 capsule (20 mg total) by mouth daily. 06/05/23   Lavell Bari LABOR, FNP  Polyethyl Glycol-Propyl Glycol (SYSTANE) 0.4-0.3 % GEL ophthalmic gel Place 1 application into both eyes at bedtime as needed (dry eyes).    [provider]  predniSONE  (DELTASONE ) 5 MG tablet TAKE 1 TABLET (5 MG TOTAL) BY MOUTH 2 (TWO) TIMES DAILY WITH A MEAL. 06/18/23   Lavell Bari LABOR, FNP  vitamin C  (ASCORBIC ACID ) 500 MG tablet Take 1,000 mg by mouth daily. AM    [provider]  Vitamin D , Ergocalciferol , (DRISDOL ) 1.25 MG (50000 UNIT) CAPS capsule TAKE 1 CAPSULE (50,000 UNITS TOTAL) BY MOUTH EVERY 7 (SEVEN) DAYS 09/26/22   Lavell Bari LABOR, FNP    Physical Exam: BP (!) 126/55 (BP Location: Right Arm)   Pulse 81   Temp 97.9 F (36.6 C)   Resp 19   Ht 5' 7 (1.702 m)   Wt 80.7 kg   SpO2 96%   BMI 27.88 kg/m   General: 83 y.o. year-old female well developed well nourished in no acute distress.  Alert and oriented x3. HEENT: NCAT, EOMI Neck: Supple, trachea medial Cardiovascular: Regular rate and rhythm with no rubs or gallops.  No thyromegaly or JVD noted.  No lower extremity edema. 2/4 pulses in all 4 extremities. Respiratory: Clear to auscultation with no wheezes or rales. Good inspiratory effort. Abdomen: Soft, nontender nondistended with normal bowel sounds x4 quadrants. Muskuloskeletal: No cyanosis, clubbing or edema noted bilaterally Neuro: CN II-XII intact, strength 5/5 x 4, sensation, reflexes intact Skin: No ulcerative lesions  noted or rashes Psychiatry: Judgement and insight appear normal. Mood is appropriate for condition and setting          Labs on Admission:  Basic Metabolic Panel: Recent  Labs  Lab 01/26/24 1554 01/28/24 1901  NA 139 139  K 5.0 5.0  CL 101 101  CO2 19* 19*  GLUCOSE 148* 122*  BUN 42* 56*  CREATININE 2.20* 2.59*  CALCIUM  10.5* 10.4*   Liver Function Tests: Recent Labs  Lab 01/26/24 1554 01/28/24 1901  AST 24 30  ALT 19 20  ALKPHOS 51 51  BILITOT 0.3 0.3  PROT 7.1 7.3  ALBUMIN 4.2 4.1   No results for input(s): LIPASE, AMYLASE in the last 168 hours. No results for input(s): AMMONIA in the last 168 hours. CBC: Recent Labs  Lab 01/26/24 1554 01/28/24 1901  WBC 10.5 9.2  NEUTROABS 7.9* 5.4  HGB 13.4 13.8  HCT 42.2 43.8  MCV 89 88.3  PLT 329 295   Cardiac Enzymes: No results for input(s): CKTOTAL, CKMB, CKMBINDEX, TROPONINI in the last 168 hours.  BNP (last 3 results) Recent Labs    09/10/23 1609  BNP 441.3*    ProBNP (last 3 results) No results for input(s): PROBNP in the last 8760 hours.  CBG: No results for input(s): GLUCAP in the last 168 hours.  Radiological Exams on Admission: CT ABDOMEN PELVIS WO CONTRAST Result Date: 01/28/2024 CLINICAL DATA:  Right lower quadrant pain EXAM: CT ABDOMEN AND PELVIS WITHOUT CONTRAST TECHNIQUE: Multidetector CT imaging of the abdomen and pelvis was performed following the standard protocol without IV contrast. RADIATION DOSE REDUCTION: This exam was performed according to the departmental dose-optimization program which includes automated exposure control, adjustment of the mA and/or kV according to patient size and/or use of iterative reconstruction technique. COMPARISON:  12/15/2017 FINDINGS: Lower chest: Lung bases demonstrate some mild scarring. Small nodule is noted in the left lower lobe which appears new from the prior exam. This is best visualized on image number 31 of series 4 and measures approximately 4 mm. Hepatobiliary: No focal liver abnormality is seen. No gallstones, gallbladder wall thickening, or biliary dilatation. Pancreas: Unremarkable. No pancreatic ductal  dilatation or surrounding inflammatory changes. Spleen: Normal in size without focal abnormality. Adrenals/Urinary Tract: Adrenal glands are within normal limits. 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 Right kidney is well visualized and shows no renal calculi. Mild fullness of the collecting system is noted without definitive obstructing stone. The bladder is partially distended. Some wall thickening and mild Peri vesicular inflammatory changes are noted which may represent an underlying UTI or cystitis. Stomach/Bowel: Scattered diverticular change of the colon is noted. No obstructive changes are seen. The appendix is not visualized. No inflammatory changes to suggest appendicitis are seen. Small bowel and stomach are within normal limits. Vascular/Lymphatic: Aortic atherosclerosis. No enlarged abdominal or pelvic lymph nodes. Reproductive: Status post hysterectomy. No adnexal masses. Other: No abdominal wall hernia or abnormality. No abdominopelvic ascites. Musculoskeletal: Degenerative changes and postsurgical changes in the lumbar spine are noted. IMPRESSION: Mild fullness of the right renal collecting system without definitive obstructive stone. This may be related to edema from recently passed stone or possibly related to the inflammatory changes in the bladder. Inflammatory changes and mild wall thickening in the bladder which may represent underlying UTI or cystitis. Correlate with laboratory values. Diverticulosis without diverticulitis. Less than 6 mm left solid pulmonary nodule. Per Fleischner Society Guidelines, no routine follow-up imaging is recommended. These guidelines do not apply  to immunocompromised patients and patients with cancer. Follow up in patients with significant comorbidities as clinically warranted. For lung cancer screening, adhere to Lung-RADS guidelines. Reference: Radiology. 2017; 284(1):228-43. Electronically Signed   By: [name] Devonshire M.D.   On:  01/28/2024 19:42    EKG: I independently viewed the EKG done and my findings are as followed: EKG was not done in the ED  Assessment/Plan Present on Admission:  Acute kidney injury  Chronic diastolic heart failure (HCC)  Chronic respiratory failure with hypoxia (HCC)  Permanent atrial fibrillation (HCC)  Acquired hypothyroidism  Pulmonary fibrosis (HCC)  Essential hypertension  Principal Problem:   Acute kidney injury Active Problems:   Essential hypertension   Acquired hypothyroidism   Type 2 diabetes mellitus (HCC)   Pulmonary fibrosis (HCC)   Permanent atrial fibrillation (HCC)   Chronic respiratory failure with hypoxia (HCC)   Chronic diastolic heart failure (HCC)   Cystitis   Hypercalcemia   History of COPD  Acute kidney injury superimposed on CKD 4 Creatinine of 2.2 (baseline creatinine at 1.1-1.2) Continue gentle hydration Renally adjust medications, avoid nephrotoxic agents/dehydration/hypotension  Cystitis POA Patient was started on IV ceftriaxone , continue IV ceftriaxone  Urine culture pending  Hypercalcemia Calcium  10.4 Calcium  citrate will be held at this time Continue IV hydration and recheck BMP in the morning  Chronic diastolic heart failure Chronic stable dyspnea.  O2 sats greater than 92% on room air.  On chronic O2 PRN.   Last echo 07/2022 EF of 65 to 70%. Temporarily hold Lasix  due to acute kidney injury  Chronic respiratory failure with hypoxia Patient uses 2 L via nasal cannula as needed at baseline She is currently on room air here without oxygen  requirement Continue oxygen  as needed at 2 L nasal cannula maintain O2 sats > 92%   Permanent atrial fibrillation  Continue Eliquis  for stroke prophylaxis Continue atenolol  for rate control   Acquired hypothyroidism Continue levothyroxine    Pulmonary fibrosis  History of COPD Resume prednisone  5 mg twice daily. Continue albuterol  as needed   Type 2 diabetes mellitus  Hemoglobin A1c on  01/26/2023 was 6.6 Continue ISS and hypoglycemia protocol Metformin  will be held at this time   Essential hypertension (controlled) Continue Norvasc  to 2.5 mg daily, atenolol  25 mg daily  ???  Tenia inguinal*** Continue nystatin cream    DVT prophylaxis: Eliquis   Code Status: Full code  Family Communication: None at bedside  Consults: None  Severity of Illness: The appropriate patient status for this patient is INPATIENT. Inpatient status is judged to be reasonable and necessary in order to provide the required intensity of service to ensure the patient's safety. The patient's presenting symptoms, physical exam findings, and initial radiographic and laboratory data in the context of their chronic comorbidities is felt to place them at high risk for further clinical deterioration. Furthermore, it is not anticipated that the patient will be medically stable for discharge from the hospital within 2 midnights of admission.   * I certify that at the point of admission it is my clinical judgment that the patient will require inpatient hospital care spanning beyond 2 midnights from the point of admission due to high intensity of service, high risk for further deterioration and high frequency of surveillance required.*  Author: Okema Rollinson, DO 01/28/2024 9:46 PM  For on call review [email or web address].

## 2024-01-29 DIAGNOSIS — N179 Acute kidney failure, unspecified: Secondary | ICD-10-CM

## 2024-01-29 DIAGNOSIS — I442 Atrioventricular block, complete: Secondary | ICD-10-CM | POA: Insufficient documentation

## 2024-01-29 DIAGNOSIS — B372 Candidiasis of skin and nail: Secondary | ICD-10-CM | POA: Insufficient documentation

## 2024-01-29 LAB — CBC
HCT: 40.5 % (ref 36.0–46.0)
Hemoglobin: 12.8 g/dL (ref 12.0–15.0)
MCH: 28.1 pg (ref 26.0–34.0)
MCHC: 31.6 g/dL (ref 30.0–36.0)
MCV: 88.8 fL (ref 80.0–100.0)
Platelets: 243 K/uL (ref 150–400)
RBC: 4.56 MIL/uL (ref 3.87–5.11)
RDW: 18.7 % — ABNORMAL HIGH (ref 11.5–15.5)
WBC: 8.7 K/uL (ref 4.0–10.5)
nRBC: 0 % (ref 0.0–0.2)

## 2024-01-29 LAB — COMPREHENSIVE METABOLIC PANEL WITH GFR
ALT: 16 U/L (ref 0–44)
AST: 24 U/L (ref 15–41)
Albumin: 3.8 g/dL (ref 3.5–5.0)
Alkaline Phosphatase: 44 U/L (ref 38–126)
Anion gap: 10 (ref 5–15)
BUN: 49 mg/dL — ABNORMAL HIGH (ref 8–23)
CO2: 22 mmol/L (ref 22–32)
Calcium: 9.8 mg/dL (ref 8.9–10.3)
Chloride: 105 mmol/L (ref 98–111)
Creatinine, Ser: 1.91 mg/dL — ABNORMAL HIGH (ref 0.44–1.00)
GFR, Estimated: 26 mL/min — ABNORMAL LOW (ref >60)
Glucose, Bld: 100 mg/dL — ABNORMAL HIGH (ref 70–99)
Potassium: 4.5 mmol/L (ref 3.5–5.1)
Sodium: 137 mmol/L (ref 135–145)
Total Bilirubin: 0.4 mg/dL (ref 0.0–1.2)
Total Protein: 6.4 g/dL — ABNORMAL LOW (ref 6.5–8.1)

## 2024-01-29 LAB — MAGNESIUM: Magnesium: 2.2 mg/dL (ref 1.7–2.4)

## 2024-01-29 LAB — GLUCOSE, CAPILLARY
Glucose-Capillary: 104 mg/dL — ABNORMAL HIGH (ref 70–99)
Glucose-Capillary: 135 mg/dL — ABNORMAL HIGH (ref 70–99)
Glucose-Capillary: 136 mg/dL — ABNORMAL HIGH (ref 70–99)
Glucose-Capillary: 169 mg/dL — ABNORMAL HIGH (ref 70–99)

## 2024-01-29 LAB — PHOSPHORUS: Phosphorus: 2.7 mg/dL (ref 2.5–4.6)

## 2024-01-29 NOTE — TOC Initial Note (Signed)
 Transition of Care [facility]) - Initial/Assessment Note    Patient Details  Name: Lorraine Leonard MRN: [id number] Date of Birth: 12/15/1940  [facility]) CM/SW Contact:    Lucie Lunger, LCSWA Phone Number: 01/29/2024, 2:56 PM  Clinical Narrative:                 CSW notes PT is recommending SNF for pt at D/C. CSW met with pt at bedside to review. Pt states she lives alone. Pt states she is agreeable to SNF referral being sent out to local facility for review. CSW to complete referral and send out. TOC to follow.   Expected Discharge Plan: Skilled Nursing Facility Barriers to Discharge: Continued Medical Work up   Patient Goals and CMS Choice Patient states their goals for this hospitalization and ongoing recovery are:: get stronger CMS Medicare.gov Compare Post Acute Care list provided to:: Patient Choice offered to / list presented to : Patient [place] ownership interest in [facility].provided to:: Patient    Expected Discharge Plan and Services In-house Referral: Clinical Social Work Discharge Planning Services: CM Consult Post Acute Care Choice: Skilled Nursing Facility Living arrangements for the past 2 months: Single Family Home                                      Prior Living Arrangements/Services Living arrangements for the past 2 months: Single Family Home Lives with:: Self Patient language and need for interpreter reviewed:: Yes Do you feel safe going back to the place where you live?: Yes      Need for Family Participation in Patient Care: Yes (Comment) Care giver support system in place?: Yes (comment)   Criminal Activity/Legal Involvement Pertinent to Current Situation/Hospitalization: No - Comment as needed  Activities of Daily Living   ADL Screening (condition at time of admission) Independently performs ADLs?: No Does the patient have a NEW difficulty with bathing/dressing/toileting/self-feeding that is expected to last >3  days?: No Does the patient have a NEW difficulty with getting in/out of bed, walking, or climbing stairs that is expected to last >3 days?: Yes (Initiates electronic notice to provider for possible PT consult) Does the patient have a NEW difficulty with communication that is expected to last >3 days?: No Is the patient deaf or have difficulty hearing?: Yes Does the patient have difficulty seeing, even when wearing glasses/contacts?: Yes Does the patient have difficulty concentrating, remembering, or making decisions?: Yes  Permission Sought/Granted                  Emotional Assessment Appearance:: Appears stated age Attitude/Demeanor/Rapport: Engaged Affect (typically observed): Accepting Orientation: : Oriented to Self, Oriented to Place, Oriented to  Time, Oriented to Situation Alcohol  / Substance Use: Not Applicable Psych Involvement: No (comment)  Admission diagnosis:  Acute cystitis with hematuria [N30.01] AKI (acute kidney injury) [N17.9] Acute kidney injury [N17.9] Patient Active Problem List   Diagnosis Date Noted   Candidal intertrigo 01/29/2024   Complete heart block (HCC) 01/29/2024   Acute kidney injury 01/28/2024   Cystitis 01/28/2024   Hypercalcemia 01/28/2024   History of COPD 01/28/2024   Wound of left leg 11/24/2023   Left leg cellulitis 11/24/2023   Recurrent UTI 10/06/2023   Atrophic vaginitis 10/06/2023   Immunosuppression due to drug therapy 10/06/2023   Ambulatory dysfunction 10/06/2023   Dependent for transportation 10/06/2023   Epigastric pain 03/27/2021  Gastric nodule 03/27/2021   Iron  deficiency anemia due to chronic blood loss 03/27/2021   History of colonic polyps 03/27/2021   Intractable back pain 07/08/2020   CKD (chronic kidney disease), stage 3b (HCC) 07/08/2020   Aorto-iliac atherosclerosis 07/08/2020   Chronic diastolic heart failure (HCC) 07/08/2020   Asthmatic bronchitis , chronic (HCC) 03/19/2020   Idiopathic urticaria  10/19/2018   Osteoarthritis 07/22/2018   Chronic respiratory failure with hypoxia (HCC) 03/26/2017   COPD (chronic obstructive pulmonary disease) (HCC) 03/26/2017   Permanent atrial fibrillation (HCC) 02/09/2017   Acute diastolic CHF (congestive heart failure) (HCC) 11/11/2016   Pulmonary fibrosis (HCC) 09/23/2016   Type 2 diabetes mellitus (HCC) 07/17/2016   Obesity (BMI 30-39.9) 04/18/2016   Snoring 11/09/2015   Pulmonary hypertension (HCC) 11/09/2015   Gout 10/19/2015   Chronic urticaria 03/24/2015   Metabolic syndrome 03/01/2015   Solitary kidney, acquired 03/01/2015   Macular degeneration of both eyes 11/28/2014   Constipation 11/28/2014   Chronic rhinitis 11/17/2014   Hypertensive retinopathy 10/30/2014   Vitamin D  deficiency 08/17/2014   Arthritis of knee 08/17/2014   Hyperlipidemia associated with type 2 diabetes mellitus (HCC) 08/17/2014   Depression    Acquired hypothyroidism    Anxiety    Cardiomegaly 05/05/2011   Essential hypertension 02/08/2007   PULMONARY NODULE 02/08/2007   GASTROESOPHAGEAL REFLUX DISEASE 02/08/2007   PCP:  Lavell Bari LABOR, FNP Pharmacy:   [facility] [phone] - [place], [place] - [address] [place] [address] Phone: [phone] Fax: [phone]     Social Drivers of Health (SDOH) Social History: SDOH Screenings   Food Insecurity: No Food Insecurity (01/29/2024)  Recent Concern: Food Insecurity - Food Insecurity Present (12/29/2023)  Housing: Low Risk  (01/29/2024)  Transportation Needs: Unmet Transportation Needs (01/29/2024)  Utilities: Not At Risk (01/29/2024)  Alcohol  Screen: Low Risk  (03/28/2022)  Depression (PHQ2-9): High Risk (01/26/2024)  Financial Resource Strain: Low Risk  (12/29/2023)  Physical Activity: Inactive (12/29/2023)  Social Connections: Socially Isolated (01/29/2024)  Stress: Stress Concern Present (12/29/2023)  Tobacco Use: Low Risk  (01/28/2024)   SDOH Interventions:      Readmission Risk Interventions    01/29/2024    2:55 PM  Readmission Risk Prevention Plan  Transportation Screening Complete  Home Care Screening Complete  Medication Review (RN CM) Complete

## 2024-01-29 NOTE — Telephone Encounter (Unsigned)
 Copied from CRM [id number]. Topic: Medical Record Request - Provider/Facility Request >> Jan 29, 2024  2:19 PM Ivette P wrote: Reason for RMF:Ijlhyuzm [name]     CAT program sent a form for [facility] and spoke to medicaid lady that approves and denies. and was told to clairfy that pt is medicaly stable  received denied letter in mail because said she was medically unstable. - they should have put she was medically stable.    when putting medically unstable - the [facility] wont go in to take care of her because they wouldn't be licensed for medically unstable.   please review and resend documents after being fixed   . if any questions please call Al [phone] - available 4:30PM please leave a voicemail

## 2024-01-29 NOTE — TOC CM/SW Note (Signed)
 Transition of Care [facility]) - Inpatient Brief Assessment   Patient Details  Name: Lorraine Leonard MRN: [id number] Date of Birth: Jul 19, 1940  Transition of Care [facility]) CM/SW Contact:    Lucie Lunger, LCSWA Phone Number: 01/29/2024, 8:48 AM   Clinical Narrative: Transition of Care Department [facility]) has reviewed patient and no TOC needs have been identified at this time. We will continue to monitor patient advancement through interdiciplinary progression rounds. If new patient transition needs arise, please place a TOC consult.  Transition of Care Asessment: Insurance and Status: Insurance coverage has been reviewed Patient has primary care physician: Yes Home environment has been reviewed: From home Prior level of function:: Independent Prior/Current Home Services: No current home services Social Drivers of Health Review: SDOH reviewed no interventions necessary Readmission risk has been reviewed: Yes Transition of care needs: no transition of care needs at this time

## 2024-01-29 NOTE — Plan of Care (Signed)
  Problem: Acute Rehab PT Goals(only PT should resolve) Goal: Pt Will Go Supine/Side To Sit Outcome: Progressing Flowsheets (Taken 01/29/2024 1404) Pt will go Supine/Side to Sit: Independently Goal: Patient Will Transfer Sit To/From Stand Outcome: Progressing Flowsheets (Taken 01/29/2024 1404) Patient will transfer sit to/from stand: with supervision Goal: Pt Will Transfer Bed To Chair/Chair To Bed Outcome: Progressing Flowsheets (Taken 01/29/2024 1404) Pt will Transfer Bed to Chair/Chair to Bed: with supervision Goal: Pt Will Ambulate Outcome: Progressing Flowsheets (Taken 01/29/2024 1404) Pt will Ambulate:  50 feet  with rolling walker  with supervision    2:05 PM, 01/29/24 Rosaria Settler, PT, DPT [place] with [facility]

## 2024-01-29 NOTE — Progress Notes (Signed)
 RE: Lorraine Leonard  DOB: Dec 29, 1940 Date: 01/29/2024  MUST ID: [id number]  To Whom It May Concern:   Please be advised that the above name patient will require a short-term nursing home stay- anticipated 30 days or less rehabilitation and strengthening. The plan is for return home.

## 2024-01-29 NOTE — Progress Notes (Signed)
 [facility] PROGRESS NOTE  Lorraine Leonard (DOB: 01-26-1941) FMW:[id number] PCP: Lavell Bari LABOR, FNP  Brief Narrative: Lorraine Leonard is an 83 y.o. female with a history of HFpEF, HTN, HLD, T2DM, permanent AFib, OSA with nocturnal hypoxemia on supplemental O2, COPD, hypothyroidism and GERD who was recently started on cipro  for cystitis and presented to the ED on 01/28/2024 on the advice of her PCP due to Creatinine elevation (2.2 from baseline 1.1). In the ED VSS, labs confirmed renal impairment with Cr 2.59, BUN 56, bicarb 19, UA showed >50 RBCs, >50 WBCs w/clumps, hyaline casts and few bacteria. CBC unremarkable. CT showed right renal system fullness and bladder wall thickening and inflammatory changes consistent with cystitis. She was given IVF, ceftriaxone , urine culture resent (previous was negative)  Subjective: Feels a bit better, seen early this AM hadn't eaten anything yet. No new complaints.   Objective: BP (!) 127/52 (BP Location: Right Arm)   Pulse 61   Temp 97.6 F (36.4 C)   Resp 16   Ht 5' 7 (1.702 m)   Wt 81.2 kg   SpO2 99%   BMI 28.04 kg/m   Gen: Elderly female in no distress Pulm: Clear, nonlabored  CV: Irreg, PPM pocket nontender, no pitting edema GI: Soft, NT, ND, +BS  Neuro: Alert and oriented. No new focal deficits. Ext: Warm, no deformities. 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  - Continue IVF, hold cipro , metformin , lasix  for now - Given fullness, will check US   renal  NAGMA: Resolved.    UTI: No fever, leukocytosis. No pyelo by CT.  - Continue ceftriaxone  - F/u urine culture from admission on 10/9. UCx 10/7 with PCP is negative. UCx from ED 9/27 was nonclonal.    Hypercalcemia - Resolved. Hold supplementation for now.    Chronic HFpEF: Appears compensated. - Holding lasix  for today with AKI.      Chronic respiratory failure with hypoxia Patient uses 2 L via nasal cannula as needed at baseline - Continue, she is at respiratory baseline.    Permanent AFib:  - Continue atenolol  and eliquis    Hypothyroidism:  - Continue synthroid . TSH recently checked and was elevated to 15. Will need PCP follow up.    Pulmonary fibrosis  History of COPD - Continue prednisone  5 mg twice daily. - Continue albuterol  as needed   NIDT2DM: Controlled with HbA1c 6.6%.  - Hold metformin  (depending on Cr at DC, may hold pending outpatient follow up).  - SSI for now.   HTN:  - Hold norvasc  with soft BP, will continue atenolol  unless BP down still tmrw AM.    Candidal intertrigo/tinea inguinal - Continue nystatin cream   Left leg ulcer - Continue wound care    CHB s/p PPM: Last reviewed was 01/22/2024. Battery and lead parameters stable with stable capture and sensing. Device programming is appropriate.  - Continue plans for outpatient follow up with EP, Dr. Waddell 10/30.     Bernardino KATHEE Come, MD [facility]  [email or web address] 01/29/2024, 1:11 PM

## 2024-01-29 NOTE — Evaluation (Signed)
 Physical Therapy Evaluation Patient Details Name: Lorraine Leonard MRN: [id number] DOB: 1940-07-27 Today's Date: 01/29/2024  History of Present Illness  Lorraine Leonard is an 83 y.o. female with medical history significant of hypertension, hyperlipidemia, hypothyroidism, T2DM, chronic diastolic CHF, anxiety, depression, permanent atrial fibrillation, COPD, GERD, sleep apnea on nasal cannula at bedtime who presents to the emergency department due to being asked to go to the ED by her PCP due to abnormal labs.  Patient presented to the ED about 2 weeks ago, she was diagnosed to have cystitis, she was treated in the ED and discharged with a prescription for ciprofloxacin .  She presented to her PCPs office a few days ago, labs done showed creatinine of 2.2 (baseline creatinine at 1.1-1.2), urine culture grew less than 10,000 colony-forming units.  Patient complained of ongoing burning sensation and lower abdominal discomfort in the pelvic region in her urine since treatment from the ED.  She denies fever, nausea, vomiting, abdominal pain.   Clinical Impression  Patient agreeable to PT evaluation. Son present during evaluation and contributes to subjective history taking. Pt A&Ox4 but somewhat confused throughout evaluation. Reports at baseline, pt is modified independent with Ad in household for ambulation and receives assist with ADL/iADL. On this date, patient is supervision for bed mobility, but requires min assist for transfers, and CGA/min A for gait. Patient reports transient dizziness but poor reading on BP due to PT tension in arm throughout as she reports pain from cuff. No other symptoms during mobility of note. Patient is very fearful/anxious when on feet. Reports no current 24/7 supervision available. Patient handles pericare independently in sitting and can tolerate hand hygiene in standing at sink but requests to end mobility following this due to fatigue. Patient also limited due to general weakness.  Patient returns to chair at end of session, chair alarm activated, call button within reach, and son present. Patient will benefit from continued skilled physical therapy acutely and in recommended venue in order to address current deficits prior to return home for maximum safety.        If plan is discharge home, recommend the following: A little help with walking and/or transfers;A little help with bathing/dressing/bathroom;Assist for transportation;Supervision due to cognitive status;Help with stairs or ramp for entrance   Can travel by private vehicle        Equipment Recommendations None recommended by PT  Recommendations for Other Services       Functional Status Assessment Patient has had a recent decline in their functional status and demonstrates the ability to make significant improvements in function in a reasonable and predictable amount of time.     Precautions / Restrictions Precautions Precautions: Fall Recall of Precautions/Restrictions: Impaired Restrictions Weight Bearing Restrictions Per Provider Order: No      Mobility  Bed Mobility Overal bed mobility: Needs Assistance Bed Mobility: Supine to Sit     Supine to sit: Supervision     General bed mobility comments: HOB flat, no use of railings, inc time due to general weakness/fatigue    Transfers Overall transfer level: Needs assistance Equipment used: Rolling walker (2 wheels) Transfers: Sit to/from Stand, Bed to chair/wheelchair/BSC Sit to Stand: Min assist   Step pivot transfers: Min assist, Contact guard assist       General transfer comment: STS from bed, and toilet with min assist due to LE weakness.    Ambulation/Gait Ambulation/Gait assistance: Contact guard assist, Min assist Gait Distance (Feet): 15 Feet Assistive device: Rolling walker (2 wheels)  Gait Pattern/deviations: Step-through pattern, Decreased step length - right, Decreased step length - left, Drifts right/left Gait  velocity: Dec     General Gait Details: CGA/min assist for ambulation throughout room and bathroom for unsteadiness once on feet. Pt limited due to fatigue. Pt anxiety.  Stairs            Wheelchair Mobility     Tilt Bed    Modified Rankin (Stroke Patients Only)       Balance Overall balance assessment: Needs assistance Sitting-balance support: Feet supported, No upper extremity supported Sitting balance-Leahy Scale: Fair Sitting balance - Comments: poor/fair seated EOB, pt demo posterior lean at times due to pain when sitting up. Postural control: Posterior lean Standing balance support: During functional activity, Reliant on assistive device for balance, Bilateral upper extremity supported Standing balance-Leahy Scale: Fair Standing balance comment: w/ RW           Pertinent Vitals/Pain Pain Assessment Pain Assessment: Faces Faces Pain Scale: Hurts little more Pain Location: genital region due to recent urinary issues and purewick Pain Descriptors / Indicators: Discomfort Pain Intervention(s): Limited activity within patient's tolerance, Repositioned    Home Living Family/patient expects to be discharged to:: Private residence Living Arrangements: Children Available Help at Discharge: Family;Available PRN/intermittently Type of Home: Mobile home Home Access: Ramped entrance       Home Layout: One level Home Equipment: Rolling Walker (2 wheels);BSC/3in1;Shower seat;Grab bars - tub/shower;Other (comment) (Rollator) Additional Comments: Pt son present during session. Pt and son reports pt stays with one of her sons. Would not have assistance available 24/7.    Prior Function Prior Level of Function : Needs assist       Physical Assist : ADLs (physical);Mobility (physical) Mobility (physical): Transfers;Gait ADLs (physical): Dressing;Bathing;IADLs Mobility Comments: reports as household ambulator with AD or describes using furniture for support, no falls  but hit her leg against something, ADLs Comments: Assisted by family     Extremity/Trunk Assessment   Upper Extremity Assessment Upper Extremity Assessment: Generalized weakness;Overall [facility] for tasks assessed (Shoulder ROM WNL bilaterally, MMT 4-/5)    Lower Extremity Assessment Lower Extremity Assessment: Generalized weakness;Overall [facility] for tasks assessed (hip flexion MMT 4-/5 bilaterally, ankle DF 4/5 bilaterally)    Cervical / Trunk Assessment Cervical / Trunk Assessment: Kyphotic  Communication   Communication Communication: No apparent difficulties    Cognition Arousal: Alert Behavior During Therapy: WFL for tasks assessed/performed       PT - Cognition Comments: Pt alert and oriented x4 but becomes confused at times during conversation, forgetting previously answered questions or conversations with son Following commands: Impaired Following commands impaired: Follows one step commands inconsistently     Cueing Cueing Techniques: Verbal cues, Tactile cues, Visual cues     General Comments      Exercises     Assessment/Plan    PT Assessment Patient needs continued PT services;All further PT needs can be met in the next venue of care  PT Problem List Decreased strength;Decreased range of motion;Decreased activity tolerance;Decreased balance;Decreased mobility;Pain       PT Treatment Interventions DME instruction;Gait training;Stair training;Functional mobility training;Therapeutic activities;Therapeutic exercise;Balance training;Patient/family education    PT Goals (Current goals can be found in the Care Plan section)  Acute Rehab PT Goals Patient Stated Goal: Return home PT Goal Formulation: With patient Time For Goal Achievement: 02/05/24 Potential to Achieve Goals: Good    Frequency Min 3X/week     Co-evaluation  AM-PAC PT 6 Clicks Mobility  Outcome Measure Help needed turning from your back to your side while in a flat bed  without using bedrails?: None Help needed moving from lying on your back to sitting on the side of a flat bed without using bedrails?: None Help needed moving to and from a bed to a chair (including a wheelchair)?: A Little Help needed standing up from a chair using your arms (e.g., wheelchair or bedside chair)?: A Little Help needed to walk in hospital room?: A Little Help needed climbing 3-5 steps with a railing? : A Lot 6 Click Score: 19    End of Session Equipment Utilized During Treatment: Gait belt Activity Tolerance: Patient limited by pain;Patient limited by fatigue Patient left: in chair;with call bell/phone within reach;with chair alarm set;with family/visitor present   PT Visit Diagnosis: Unsteadiness on feet (R26.81);Other abnormalities of gait and mobility (R26.89);Muscle weakness (generalized) (M62.81);Difficulty in walking, not elsewhere classified (R26.2);Pain    Time: 1002-1030 PT Time Calculation (min) (ACUTE ONLY): 28 min   Charges:   PT Evaluation $PT Eval Moderate Complexity: 1 Mod   PT General Charges $$ ACUTE PT VISIT: 1 Visit         2:03 PM, 01/29/24 Tanaisha Pittman Powell-Butler, PT, DPT [place] with [facility]

## 2024-01-29 NOTE — Plan of Care (Signed)

## 2024-01-29 NOTE — NC FL2 (Signed)
 [place]  MEDICAID FL2 LEVEL OF CARE FORM     IDENTIFICATION  Patient Name: Lorraine Leonard Birthdate: 1940-07-04 Sex: female Admission Date (Current Location): 01/28/2024  [facility] and [place] Number:  [employer] and Address:  [facility],  [address] [place] [address]      Provider Number: [id number]  Attending Physician Name and Address:  Bryn Bernardino NOVAK, MD  Relative Name and Phone Number:       Current Level of Care: Hospital Recommended Level of Care: Skilled Nursing Facility Prior Approval Number:    Date Approved/Denied:   PASRR Number:    Discharge Plan: SNF    Current Diagnoses: Patient Active Problem List   Diagnosis Date Noted   Candidal intertrigo 01/29/2024   Complete heart block (HCC) 01/29/2024   Acute kidney injury 01/28/2024   Cystitis 01/28/2024   Hypercalcemia 01/28/2024   History of COPD 01/28/2024   Wound of left leg 11/24/2023   Left leg cellulitis 11/24/2023   Recurrent UTI 10/06/2023   Atrophic vaginitis 10/06/2023   Immunosuppression due to drug therapy 10/06/2023   Ambulatory dysfunction 10/06/2023   Dependent for transportation 10/06/2023   Epigastric pain 03/27/2021   Gastric nodule 03/27/2021   Iron  deficiency anemia due to chronic blood loss 03/27/2021   History of colonic polyps 03/27/2021   Intractable back pain 07/08/2020   CKD (chronic kidney disease), stage 3b (HCC) 07/08/2020   Aorto-iliac atherosclerosis 07/08/2020   Chronic diastolic heart failure (HCC) 07/08/2020   Asthmatic bronchitis , chronic (HCC) 03/19/2020   Idiopathic urticaria 10/19/2018   Osteoarthritis 07/22/2018   Chronic respiratory failure with hypoxia (HCC) 03/26/2017   COPD (chronic obstructive pulmonary disease) (HCC) 03/26/2017   Permanent atrial fibrillation (HCC) 02/09/2017   Acute diastolic CHF (congestive heart failure) (HCC) 11/11/2016   Pulmonary fibrosis (HCC) 09/23/2016   Type 2 diabetes mellitus (HCC) 07/17/2016    Obesity (BMI 30-39.9) 04/18/2016   Snoring 11/09/2015   Pulmonary hypertension (HCC) 11/09/2015   Gout 10/19/2015   Chronic urticaria 03/24/2015   Metabolic syndrome 03/01/2015   Solitary kidney, acquired 03/01/2015   Macular degeneration of both eyes 11/28/2014   Constipation 11/28/2014   Chronic rhinitis 11/17/2014   Hypertensive retinopathy 10/30/2014   Vitamin D  deficiency 08/17/2014   Arthritis of knee 08/17/2014   Hyperlipidemia associated with type 2 diabetes mellitus (HCC) 08/17/2014   Depression    Acquired hypothyroidism    Anxiety    Cardiomegaly 05/05/2011   Essential hypertension 02/08/2007   PULMONARY NODULE 02/08/2007   GASTROESOPHAGEAL REFLUX DISEASE 02/08/2007    Orientation RESPIRATION BLADDER Height & Weight     Self, Time, Situation, Place  Normal Continent Weight: 179 lb 0.2 oz (81.2 kg) Height:  5' 7 (170.2 cm)  BEHAVIORAL SYMPTOMS/MOOD NEUROLOGICAL BOWEL NUTRITION STATUS      Continent Diet (Heart healthy)  AMBULATORY STATUS COMMUNICATION OF NEEDS Skin   Extensive Assist Verbally Normal                       Personal Care Assistance Level of Assistance  Bathing, Feeding, Dressing Bathing Assistance: Limited assistance Feeding assistance: Independent Dressing Assistance: Limited assistance     Functional Limitations Info  Sight, Hearing, Speech Sight Info: Impaired Hearing Info: Impaired Speech Info: Adequate    SPECIAL CARE FACTORS FREQUENCY  PT (By licensed PT), OT (By licensed OT)     PT Frequency: 5 times weekly OT Frequency: 5 times weekly  Contractures Contractures Info: Not present    Additional Factors Info  Code Status, Allergies Code Status Info: FULL Allergies Info: Macrodantin  (Nitrofurantoin  Macrocrystal), Mobic  (Meloxicam ), Allopurinol            Current Medications (01/29/2024):  This is the current hospital active medication list Current Facility-Administered Medications  Medication Dose Route  Frequency Provider Last Rate Last Admin   acetaminophen  (TYLENOL ) tablet 650 mg  650 mg Oral Q6H PRN Adefeso, Oladapo, DO       Or   acetaminophen  (TYLENOL ) suppository 650 mg  650 mg Rectal Q6H PRN Adefeso, Oladapo, DO       albuterol  (PROVENTIL ) (2.5 MG/3ML) 0.083% nebulizer solution 2.5 mg  2.5 mg Nebulization Q6H PRN Adefeso, Oladapo, DO       apixaban  (ELIQUIS ) tablet 2.5 mg  2.5 mg Oral BID Adefeso, Oladapo, DO   2.5 mg at 01/29/24 0940   atenolol  (TENORMIN ) tablet 25 mg  25 mg Oral Daily Adefeso, Oladapo, DO   25 mg at 01/29/24 0942   cefTRIAXone  (ROCEPHIN ) 1 g in sodium chloride  0.9 % 100 mL IVPB  1 g Intravenous Q24H Adefeso, Oladapo, DO       insulin  aspart (novoLOG ) injection 0-9 Units  0-9 Units Subcutaneous TID WC Adefeso, Oladapo, DO       levothyroxine  (SYNTHROID ) tablet 100 mcg  100 mcg Oral Q0600 Adefeso, Oladapo, DO   100 mcg at 01/29/24 0438   nystatin cream (MYCOSTATIN)   Topical BID Cleotilde Rogue, MD   Given at 01/29/24 [phone]   ondansetron  (ZOFRAN ) tablet 4 mg  4 mg Oral Q6H PRN Adefeso, Oladapo, DO       Or   ondansetron  (ZOFRAN ) injection 4 mg  4 mg Intravenous Q6H PRN Adefeso, Oladapo, DO       oxyCODONE  (Oxy IR/ROXICODONE ) immediate release tablet 5 mg  5 mg Oral Q4H PRN Adefeso, Oladapo, DO   5 mg at 01/28/24 2121   pantoprazole  (PROTONIX ) EC tablet 40 mg  40 mg Oral Daily Adefeso, Oladapo, DO   40 mg at 01/29/24 0943   predniSONE  (DELTASONE ) tablet 5 mg  5 mg Oral BID WC Adefeso, Oladapo, DO         Discharge Medications: Please see discharge summary for a list of discharge medications.  Relevant Imaging Results:  Relevant Lab Results:   Additional Information SSN: [id number] LCSWA

## 2024-01-29 NOTE — Plan of Care (Signed)
  Problem: Education: Goal: Knowledge of General Education information will improve Description: Including pain rating scale, medication(s)/side effects and non-pharmacologic comfort measures Outcome: Progressing   Problem: Clinical Measurements: Goal: Cardiovascular complication will be avoided Outcome: Progressing   Problem: Activity: Goal: Risk for activity intolerance will decrease Outcome: Progressing   Problem: Coping: Goal: Level of anxiety will decrease Outcome: Progressing   Problem: Elimination: Goal: Will not experience complications related to urinary retention Outcome: Progressing   Problem: Pain Managment: Goal: General experience of comfort will improve and/or be controlled Outcome: Progressing   Problem: Safety: Goal: Ability to remain free from injury will improve Outcome: Progressing

## 2024-01-29 NOTE — Progress Notes (Signed)
 Patient became very disoriented around 1500. Climbing out the bed and getting out of the chair unassisted; yelling out for help. Patient removed her IV herself and refused new placement. Dr. Bryn called to the bedside to assist with calming patient and encourage her to allow new IV placement. Patient assisted to bed. IV placed by CN. Bed alarm armed.

## 2024-01-30 DIAGNOSIS — N179 Acute kidney failure, unspecified: Secondary | ICD-10-CM | POA: Diagnosis not present

## 2024-01-30 LAB — GLUCOSE, CAPILLARY
Glucose-Capillary: 119 mg/dL — ABNORMAL HIGH (ref 70–99)
Glucose-Capillary: 121 mg/dL — ABNORMAL HIGH (ref 70–99)
Glucose-Capillary: 131 mg/dL — ABNORMAL HIGH (ref 70–99)

## 2024-01-30 LAB — BASIC METABOLIC PANEL WITH GFR
Anion gap: 11 (ref 5–15)
BUN: 33 mg/dL — ABNORMAL HIGH (ref 8–23)
CO2: 22 mmol/L (ref 22–32)
Calcium: 9.7 mg/dL (ref 8.9–10.3)
Chloride: 109 mmol/L (ref 98–111)
Creatinine, Ser: 1.43 mg/dL — ABNORMAL HIGH (ref 0.44–1.00)
GFR, Estimated: 36 mL/min — ABNORMAL LOW (ref >60)
Glucose, Bld: 110 mg/dL — ABNORMAL HIGH (ref 70–99)
Potassium: 4.7 mmol/L (ref 3.5–5.1)
Sodium: 141 mmol/L (ref 135–145)

## 2024-01-30 LAB — URINE CULTURE: Culture: NO GROWTH

## 2024-01-30 MED ORDER — FUROSEMIDE 40 MG PO TABS: 40.0000 mg | ORAL_TABLET | Freq: Every day | ORAL | Status: DC

## 2024-01-30 MED ORDER — FOSFOMYCIN TROMETHAMINE 3 G PO PACK
3.0000 g | PACK | Freq: Once | ORAL | Status: AC
  Administered 2024-01-30: 3 g via ORAL
  Filled 2024-01-30: qty 3

## 2024-01-30 NOTE — TOC Transition Note (Addendum)
 Transition of Care [facility]) - Discharge Note   Patient Details  Name: Lorraine Leonard MRN: [id number] Date of Birth: June 23, 1940  [facility] CM/SW Contact:  Sharlyne Stabs, RN Phone Number: 01/30/2024, 1:16 PM   Clinical Narrative:   After discussion with MD/ RN and family,patient will discharge home and follow up with PCP to get further testing for Dementia. Patient worse at night last night, requiring a sitter. Family may need to pursue LTC.   Final next level of care: Home/Self Care Barriers to Discharge: Barriers Resolved   Patient Goals and CMS Choice Patient states their goals for this hospitalization and ongoing recovery are:: get stronger CMS Medicare.gov Compare Post Acute Care list provided to:: Patient Choice offered to / list presented to : Patient  ownership interest in [facility].provided to:: Patient    Discharge Placement               Patient and family notified of of transfer: 01/30/24  Discharge Plan and Services Additional resources added to the After Visit Summary for   In-house Referral: Clinical Social Work Discharge Planning Services: CM Consult Post Acute Care Choice: Skilled Nursing Facility                 Social Drivers of Health (SDOH) Interventions SDOH Screenings   Food Insecurity: No Food Insecurity (01/29/2024)  Recent Concern: Food Insecurity - Food Insecurity Present (12/29/2023)  Housing: Low Risk  (01/29/2024)  Transportation Needs: Unmet Transportation Needs (01/29/2024)  Utilities: Not At Risk (01/29/2024)  Alcohol  Screen: Low Risk  (03/28/2022)  Depression (PHQ2-9): High Risk (01/26/2024)  Financial Resource Strain: Low Risk  (12/29/2023)  Physical Activity: Inactive (12/29/2023)  Social Connections: Socially Isolated (01/29/2024)  Stress: Stress Concern Present (12/29/2023)  Tobacco Use: Low Risk  (01/28/2024)    Readmission Risk Interventions    01/29/2024    2:55 PM  Readmission Risk Prevention  Plan  Transportation Screening Complete  Home Care Screening Complete  Medication Review (RN CM) Complete

## 2024-01-30 NOTE — Progress Notes (Signed)
 Patients daughter returned phone call to this Clinical research associate as another nurse had placed a call to family to inform the family of the patients room change from room 317 to room 314. Pts daughter wanted an explanation as to why her mother was being moved, and this Clinical research associate explained to her that it was for safety and due to confusion, and staff having a very hard time redirecting patient, staff thought it would be in the patients best interest regarding her safety if she were close to the nurses station especially since the patient gets up OOB without calling for assistance and is fast about getting up as she will already be ambulating to the BR by the time staff can respond to bed alarm going off. Pts daughter was okay with room change and understood. No further concerns or questions noted at this time.

## 2024-01-30 NOTE — Progress Notes (Signed)
 Called and left a message with [name], son about discharge. Per the sister, Al, he may not be available until after 430 today.

## 2024-01-30 NOTE — Discharge Summary (Signed)
 Physician Discharge Summary   Patient: Lorraine Leonard MRN: [id number] DOB: 11/14/1940  Admit date:     01/28/2024  Discharge date: {dischdate:26783}  Discharge Physician: Lorraine Leonard   PCP: Lorraine Bari LABOR, FNP   Recommendations at discharge:  {Tip this will not be part of the note when signed- Example include specific recommendations for outpatient follow-up, pending tests to follow-up on. (Optional):26781}  ***  Discharge Diagnoses: Principal Problem:   Acute kidney injury Active Problems:   Essential hypertension   Acquired hypothyroidism   Type 2 diabetes mellitus (HCC)   Pulmonary fibrosis (HCC)   Permanent atrial fibrillation (HCC)   Chronic respiratory failure with hypoxia (HCC)   Chronic diastolic heart failure (HCC)   Cystitis   Hypercalcemia   History of COPD   Candidal intertrigo   Complete heart block (HCC)  Resolved Problems:   * No resolved hospital problems. [facility] Course: No notes on file  Assessment and Plan: No notes have been filed under this hospital service. Service: Hospitalist     {Tip this will not be part of the note when signed Body mass index is 28.04 kg/m. , ,  (Optional):26781}  {(NOTE) Pain control PDMP Statment (Optional):26782} Consultants: *** Procedures performed: ***  Disposition: {Plan; Disposition:26390} Diet recommendation:  {Diet_Plan:26776} DISCHARGE MEDICATION: Allergies as of 01/30/2024       Reactions   Macrodantin  [nitrofurantoin  Macrocrystal] Other (See Comments)   Unknown    Mobic  [meloxicam ] Swelling, Other (See Comments)   Feet and legs swell   Allopurinol  Swelling, Other (See Comments)   Feet and legs swell        Medication List     TAKE these medications    acetaminophen  325 MG tablet Commonly known as: TYLENOL  Take 2 tablets (650 mg total) by mouth every 6 (six) hours as needed for mild pain (pain score 1-3) or fever (or Fever >/= 101).   albuterol  108 (90 Base) MCG/ACT  inhaler Commonly known as: VENTOLIN  HFA INHALE 2 PUFFS INTO THE LUNGS EVERY 6 HOURS AS NEEDED FOR WHEEZE OR SHORTNESS OF BREATH What changed:  how much to take how to take this when to take this reasons to take this additional instructions   amLODipine  5 MG tablet Commonly known as: NORVASC  Take 0.5 tablets (2.5 mg total) by mouth daily.   apixaban  5 MG Tabs tablet Commonly known as: Eliquis  Take 1 tablet (5 mg total) by mouth 2 (two) times daily.   ascorbic acid  500 MG tablet Commonly known as: VITAMIN C  Take 1,000 mg by mouth daily. AM   atenolol  25 MG tablet Commonly known as: TENORMIN  Take 1 tablet (25 mg total) by mouth daily.   calcium  citrate 950 (200 Ca) MG tablet Commonly known as: CALCITRATE - dosed in mg elemental calcium  Take 1 tablet by mouth daily.   cephALEXin  250 MG capsule Commonly known as: KEFLEX  Take 250 mg by mouth daily.   cyanocobalamin  1000 MCG tablet Take 1,000 mcg by mouth daily. Vitamin b12 every AM   fluconazole  150 MG tablet Commonly known as: DIFLUCAN  Take 1 tablet (150 mg total) by mouth every three (3) days as needed.   fluticasone  50 MCG/ACT nasal spray Commonly known as: FLONASE  PLACE 1 SPRAY INTO BOTH NOSTRILS 2 (TWO) TIMES DAILY AS NEEDED FOR ALLERGIES OR RHINITIS. What changed: when to take this   furosemide  40 MG tablet Commonly known as: LASIX  Take 1 tablet (40 mg total) by mouth daily. What changed:  how much to take how  to take this when to take this additional instructions   Iron  (Ferrous Sulfate ) 325 (65 Fe) MG Tabs Take 325 mg by mouth daily.   levocetirizine 5 MG tablet Commonly known as: XYZAL  TAKE 1 TABLET BY MOUTH EVERY DAY IN THE EVENING What changed:  how much to take how to take this   levothyroxine  100 MCG tablet Commonly known as: SYNTHROID  TAKE 1 TABLET BY MOUTH EVERY DAY   metFORMIN  500 MG 24 hr tablet Commonly known as: GLUCOPHAGE -XR Take 1 tablet (500 mg total) by mouth daily with  breakfast.   nystatin powder Commonly known as: MYCOSTATIN/NYSTOP Apply 1 Application topically 3 (three) times daily.   Olopatadine  HCl 0.2 % Soln INSTILL 1 DROP INTO BOTH EYES EVERY DAY   omeprazole  20 MG capsule Commonly known as: PRILOSEC Take 1 capsule (20 mg total) by mouth daily.   Polyethyl Glycol-Propyl Glycol 0.4-0.3 % Gel ophthalmic gel Commonly known as: SYSTANE Place 1 application into both eyes at bedtime as needed (dry eyes).   predniSONE  5 MG tablet Commonly known as: DELTASONE  TAKE 1 TABLET (5 MG TOTAL) BY MOUTH 2 (TWO) TIMES DAILY WITH A MEAL.   PreserVision AREDS 2 Caps Take 1 capsule by mouth 2 (two) times daily.   Vitamin D  (Ergocalciferol ) 1.25 MG (50000 UNIT) Caps capsule Commonly known as: DRISDOL  TAKE 1 CAPSULE (50,000 UNITS TOTAL) BY MOUTH EVERY 7 (SEVEN) DAYS        Follow-up Information     Lorraine Bari LABOR, FNP Follow up.   Specialty: Family Medicine Contact information: [address] [place] [address] [phone]         [facility] Follow up.   Contact information: [address]     [address] [place]  [address] [phone]               Discharge Exam: Lorraine Leonard   01/28/24 1834 01/28/24 2105  Weight: 80.7 kg 81.2 kg   ***  Condition at discharge: {DC Condition:26389}  The results of significant diagnostics from this hospitalization (including imaging, microbiology, ancillary and laboratory) are listed below for reference.   Imaging Studies: CT ABDOMEN PELVIS WO CONTRAST Result Date: 01/28/2024 CLINICAL DATA:  Right lower quadrant pain EXAM: CT ABDOMEN AND PELVIS WITHOUT CONTRAST TECHNIQUE: Multidetector CT imaging of the abdomen and pelvis was performed following the standard protocol without IV contrast. RADIATION DOSE REDUCTION: This exam was performed according to the departmental dose-optimization program which includes automated exposure control, adjustment  of the mA and/or kV according to patient size and/or use of iterative reconstruction technique. COMPARISON:  12/15/2017 FINDINGS: Lower chest: Lung bases demonstrate some mild scarring. Small nodule is noted in the left lower lobe which appears new from the prior exam. This is best visualized on image number 31 of series 4 and measures approximately 4 mm. Hepatobiliary: No focal liver abnormality is seen. No gallstones, gallbladder wall thickening, or biliary dilatation. Pancreas: Unremarkable. No pancreatic ductal dilatation or surrounding inflammatory changes. Spleen: Normal in size without focal abnormality. Adrenals/Urinary Tract: Adrenal glands are within normal limits. 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 Right kidney is well visualized and shows no renal calculi. Mild fullness of the collecting system is noted without definitive obstructing stone. The bladder is partially distended. Some wall thickening and mild Peri vesicular inflammatory changes are noted which may represent an underlying UTI or cystitis. Stomach/Bowel: Scattered diverticular change of the colon is noted. No obstructive changes are seen. The appendix is  not visualized. No inflammatory changes to suggest appendicitis are seen. Small bowel and stomach are within normal limits. Vascular/Lymphatic: Aortic atherosclerosis. No enlarged abdominal or pelvic lymph nodes. Reproductive: Status post hysterectomy. No adnexal masses. Other: No abdominal wall hernia or abnormality. No abdominopelvic ascites. Musculoskeletal: Degenerative changes and postsurgical changes in the lumbar spine are noted. IMPRESSION: Mild fullness of the right renal collecting system without definitive obstructive stone. This may be related to edema from recently passed stone or possibly related to the inflammatory changes in the bladder. Inflammatory changes and mild wall thickening in the bladder which may represent underlying UTI or cystitis.  Correlate with laboratory values. Diverticulosis without diverticulitis. Less than 6 mm left solid pulmonary nodule. Per Fleischner Society Guidelines, no routine follow-up imaging is recommended. These guidelines do not apply to immunocompromised patients and patients with cancer. Follow up in patients with significant comorbidities as clinically warranted. For lung cancer screening, adhere to Lung-RADS guidelines. Reference: Radiology. 2017; 284(1):228-43. Electronically Signed   By: [name] Devonshire M.D.   On: 01/28/2024 19:42   CUP PACEART REMOTE DEVICE CHECK Result Date: 01/26/2024 PPM Monthly battery check.  Estimated 3mo Normal device function. No new alerts. Follow up as scheduled monthly LA, CVRS   Microbiology: Results for orders placed or performed during the hospital encounter of 01/28/24  Urine Culture     Status: None   Collection Time: 01/28/24  6:50 PM   Specimen: Urine, Catheterized  Result Value Ref Range Status   Specimen Description   Final    URINE, CATHETERIZED Performed at [facility], [address], [place], [place] [address]    Special Requests   Final    NONE Performed at [facility], [address] [place], [place] [address]    Culture   Final    NO GROWTH Performed at [facility], [address], [place], [place] [address]    Report Status 01/30/2024 FINAL  Final   *Note: Due to a large number of results and/or encounters for the requested time period, some results have not been displayed. A complete set of results can be found in Results Review.    Labs: CBC: Recent Labs  Lab 01/26/24 1554 01/28/24 1901 01/29/24 0426  WBC 10.5 9.2 8.7  NEUTROABS 7.9* 5.4  --   HGB 13.4 13.8 12.8  HCT 42.2 43.8 40.5  MCV 89 88.3 88.8  PLT 329 295 243   Basic Metabolic Panel: Recent Labs  Lab 01/26/24 1554 01/28/24 1901 01/29/24 0426 01/30/24 0759  NA 139 139 137 141  K 5.0 5.0 4.5 4.7  CL 101 101 105 109  CO2 19* 19* 22 22  GLUCOSE 148* 122* 100*  110*  BUN 42* 56* 49* 33*  CREATININE 2.20* 2.59* 1.91* 1.43*  CALCIUM  10.5* 10.4* 9.8 9.7  MG  --   --  2.2  --   PHOS  --   --  2.7  --    Liver Function Tests: Recent Labs  Lab 01/26/24 1554 01/28/24 1901 01/29/24 0426  AST 24 30 24   ALT 19 20 16   ALKPHOS 51 51 44  BILITOT 0.3 0.3 0.4  PROT 7.1 7.3 6.4*  ALBUMIN 4.2 4.1 3.8   CBG: Recent Labs  Lab 01/29/24 1118 01/29/24 1623 01/29/24 2055 01/30/24 0726 01/30/24 1126  GLUCAP 135* 136* 169* 119* 121*    Discharge time spent: {LESS THAN/GREATER THAN:26388} 30 minutes.  Signed: Bernardino KATHEE Come, MD [facility] 01/30/2024

## 2024-01-30 NOTE — Progress Notes (Signed)
 Decided against moving the patient to room 314 and kept the patient in room 317 as she was able to have a sitter. She was calm throughout most of the night. Confused and disoriented but able to follow commands. Patient can be easily redirected. Patient does have poor safety awareness and is deemed a HIGH fall risk.

## 2024-01-31 ENCOUNTER — Other Ambulatory Visit: Payer: Self-pay | Admitting: Family

## 2024-02-01 ENCOUNTER — Ambulatory Visit: Payer: Self-pay

## 2024-02-01 ENCOUNTER — Telehealth: Payer: Self-pay | Admitting: *Deleted

## 2024-02-01 ENCOUNTER — Telehealth: Payer: Self-pay | Admitting: Family Medicine

## 2024-02-01 ENCOUNTER — Telehealth: Payer: Self-pay | Admitting: Family

## 2024-02-01 DIAGNOSIS — R413 Other amnesia: Secondary | ICD-10-CM

## 2024-02-01 NOTE — Telephone Encounter (Signed)
 Copied from CRM [id number]. Topic: Appointments - Scheduling Inquiry for Clinic >> Feb 01, 2024 12:57 PM Harlene ORN wrote: Reason for CRM: Daughter is returning call to schedule her mother for a Hospital Follow Up for today. Please call her back today to schedule.

## 2024-02-01 NOTE — Telephone Encounter (Signed)
 LVM to schedule HFU visit- no further options for scheduling today 10/13

## 2024-02-01 NOTE — Telephone Encounter (Signed)
 Copied from CRM [id number]. Topic: General - Other >> Feb 01, 2024 11:08 AM Selinda RAMAN wrote: Reason for CRM: Al the daughter called in to check and make sure the provider corrected the CAT or PCS Program form that was filled out wrong. She also wants to make sure her provider received a request from a Dr Bryn at [facility] for a referral to [facility]. Please assist patient further.

## 2024-02-01 NOTE — Transitions of Care (Post Inpatient/ED Visit) (Signed)
   02/01/2024  Name: Lorraine Leonard MRN: [id number] DOB: 12-30-1940  Today's TOC FU Call Status: Today's TOC FU Call Status:: Unsuccessful Call (1st Attempt) Unsuccessful Call (1st Attempt) Date: 02/01/24  Attempted to reach the patient regarding the most recent Inpatient/ED visit.  Follow Up Plan: Additional outreach attempts will be made to reach the patient to complete the Transitions of Care (Post Inpatient/ED visit) call.   Cathlean Headland BSN RN [place] [facility] Health Care Management Coordinator [email or web address] Direct Dial: [phone]  Fax: [phone] Website: [email or web address]

## 2024-02-01 NOTE — Telephone Encounter (Signed)
 FYI Only or Action Required?: Action required by provider: update on patient condition and daughter will call back after trying to find transportation for patient for an appt today.  Patient was last seen in primary care on 01/26/2024 by Lavell Bari LABOR, FNP.  Called Nurse Triage reporting Dysuria.  Symptoms began ongoing issue.  Interventions attempted: OTC medications: Tylenol  and Rest, hydration, or home remedies.  Symptoms are: gradually worsening.  Triage Disposition: See PCP Within 2 Weeks  Patient/caregiver understands and will follow disposition?: Yes            Copied from CRM [id number]. Topic: Clinical - Red Word Triage >> Feb 01, 2024 11:05 AM Selinda RAMAN wrote: Red Word that prompted transfer to Nurse Triage: Al the daughter of the patient called stating her mother was recently in the hospital and prescribed some medicated powders for yeast infection or uti and she states they do not help and she still has quite a bit of vaginal pain. I will transfer her to E2C2 NT. Reason for Disposition  [1] SEVERE pain with urination (e.g., excruciating) AND [2] not improved after 2 hours of pain medicine  Answer Assessment - Initial Assessment Questions Patient discharged 01/30/2024 Pain with urination Patient states medicated powders burn when she puts it on Took AZO yesterday Daughter advised her to take Tylenol  yesterday Daughter states that the patient is supposed to go see Neurology for confusion---daughter states that this has been going on Daughter also states that the ER provider put on her discharge notes to follow up with [facility]---daughter states that this has been going on for a year and she spoke to the hospital staff recently & they said they would recommend to her PCP that the patient goes to Neurology to be tested for Dementia/Alzheimers  Daughter is advising that the patient is still having pain with urination and she wants her to be  She also  states that there needs to be a correction on the form for in home care because he was initially denied.---it stated medically unstable instead of medically stable per daughter    1. SYMPTOM: What's the main symptom you're concerned about? (e.g., frequency, incontinence)     Pain with urination 2. ONSET: When did the  urinary pain  start?     Hurts when urinating 3. PAIN: Is there any pain? If Yes, ask: How bad is it? (Scale: 1-10; mild, moderate, severe)     Ongoing issue 4. CAUSE: What do you think is causing the symptoms?     ------ 5. OTHER SYMPTOMS: Do you have any other symptoms? (e.g., blood in urine, fever, flank pain, pain with urination)     Recent UTI and was in hospital  Answer Assessment - Initial Assessment Questions Patient discharged 01/30/2024 Pain with urination Patient states medicated powders burn when she puts it on Took AZO yesterday Daughter advised her to take Tylenol  yesterday Daughter states that the patient is supposed to go see Neurology for confusion---daughter states that this has been going on  Daughter also states that the ER provider put on her discharge notes to follow up with [facility]---daughter states that this has been going on for a year and she spoke to the hospital staff recently & they said they would recommend to her PCP that the patient goes to Neurology to be tested for Dementia/Alzheimers  Daughter is advising that the patient is still having pain with urination and she wants her to be  She also states that there needs  to be a correction on the form for in home care because he was initially denied.---it stated medically unstable instead of medically stable per daughter  Daughter is advised that we would like to try to get her in to be seen today and she is going to call and try to arrange transportation for the patient and she will call us  back to let us  know. Patient is advised to call us  back if anything changes  or with any further questions/concerns. Patient is advised that if anything worsens to go to the Emergency Room. Patient verbalized understanding.    1. SEVERITY: How bad is the pain?  (e.g., Scale 1-10; mild, moderate, or severe)     Varies---sometimes hurts really bad while on phone with daughter 4. ONSET: When did the painful urination start?      Ongoing issue 6. PAST UTI: Have you had a urine infection before? If Yes, ask: When was the last time? and What happened that time?      yes 7. CAUSE: What do you think is causing the painful urination?  (e.g., UTI, scratch, Herpes sore)     ------ 8. OTHER SYMPTOMS: Do you have any other symptoms? (e.g., blood in urine, flank pain, genital sores, urgency, vaginal discharge)     Pain with medicated powder  Protocols used: Urinary Symptoms-A-AH, Urination Pain - Female-A-AH

## 2024-02-01 NOTE — Progress Notes (Signed)
 Lorraine Leonard                                          MRN: [id number]   02/01/2024   The [facility] Quality Team Specialist reviewed this patient medical record for the purposes of chart review for care gap closure. The following were reviewed: chart review for care gap closure-kidney health evaluation for diabetes:eGFR  and uACR.    [facility]BCI Quality Team

## 2024-02-02 ENCOUNTER — Telehealth: Payer: Self-pay

## 2024-02-02 NOTE — Transitions of Care (Post Inpatient/ED Visit) (Signed)
   02/02/2024  Name: CADANCE RAUS MRN: [id number] DOB: 08-08-1940  Today's TOC FU Call Status: Today's TOC FU Call Status:: Unsuccessful Call (2nd Attempt) Unsuccessful Call (2nd Attempt) Date: 02/02/24  Attempted to reach the patient regarding the most recent Inpatient/ED visit.  Follow Up Plan: Additional outreach attempts will be made to reach the patient to complete the Transitions of Care (Post Inpatient/ED visit) call.   Alan Ee, RN, BSN, CEN [employer]- Transition of Care Team.  [facility] [phone]

## 2024-02-02 NOTE — Telephone Encounter (Signed)
 PCS form corrected and re-faxed.

## 2024-02-02 NOTE — Telephone Encounter (Signed)
 PCS form was corrected and re-faxed  Please address request from Dr Bryn for referral to [facility]

## 2024-02-02 NOTE — Telephone Encounter (Signed)
Cathy-- do you know anything about this?

## 2024-02-03 ENCOUNTER — Telehealth: Payer: Self-pay

## 2024-02-03 ENCOUNTER — Ambulatory Visit: Admitting: Family Medicine

## 2024-02-03 VITALS — BP 110/74 | HR 69 | Temp 97.3°F | Ht 67.0 in | Wt 175.0 lb

## 2024-02-03 DIAGNOSIS — E039 Hypothyroidism, unspecified: Secondary | ICD-10-CM | POA: Diagnosis not present

## 2024-02-03 DIAGNOSIS — N183 Chronic kidney disease, stage 3 unspecified: Secondary | ICD-10-CM | POA: Diagnosis not present

## 2024-02-03 DIAGNOSIS — I5031 Acute diastolic (congestive) heart failure: Secondary | ICD-10-CM | POA: Diagnosis not present

## 2024-02-03 DIAGNOSIS — E1142 Type 2 diabetes mellitus with diabetic polyneuropathy: Secondary | ICD-10-CM | POA: Diagnosis not present

## 2024-02-03 DIAGNOSIS — J8410 Pulmonary fibrosis, unspecified: Secondary | ICD-10-CM | POA: Diagnosis not present

## 2024-02-03 DIAGNOSIS — S81802S Unspecified open wound, left lower leg, sequela: Secondary | ICD-10-CM | POA: Diagnosis not present

## 2024-02-03 DIAGNOSIS — N1832 Chronic kidney disease, stage 3b: Secondary | ICD-10-CM | POA: Diagnosis not present

## 2024-02-03 NOTE — Transitions of Care (Post Inpatient/ED Visit) (Signed)
   02/03/2024  Name: Lorraine Leonard MRN: [id number] DOB: [birth year]  Today's TOC FU Call Status: Today's TOC FU Call Status:: Unsuccessful Call (3rd Attempt) Unsuccessful Call (3rd Attempt) Date: 02/03/24  Attempted to reach the patient regarding the most recent Inpatient/ED visit.  Follow Up Plan: No further outreach attempts will be made at this time. We have been unable to contact the patient.  Alan Ee, RN, BSN, CEN [employer]- Transition of Care Team.  [facility] [phone]

## 2024-02-03 NOTE — Patient Instructions (Signed)
 Please check blood pressure twice daily for the next 2 days and call the office with the results.

## 2024-02-03 NOTE — Telephone Encounter (Signed)
 Copied from CRM [id number]. Topic: General - Other >> Feb 03, 2024  2:42 PM Yolanda T wrote: Reason for CRM: daughter called stated when her mom comes in the PCS form was completed but patient was denied as the question that asked if patient was medically able to be alone needs to be answered with yes. Daughter says she only needs someone to come in to make sure she has taken her meds and help with light house work. Please f/u with daughter Metta

## 2024-02-03 NOTE — Progress Notes (Addendum)
 Acute Office Visit  Subjective:     Patient ID: Lorraine Leonard, female    DOB: [birth year], 83 y.o.   MRN: [id number]  Chief Complaint  Patient presents with   Hospitalization Follow-up    ADMITTED AT [place] ON 10/9 AND D/C ON 10/11. DIAG WITH ACUTE CYSTITIS WITH HEMATURIA AND ACUTE DIASTOLIC CHF. PATIENT REPORTS THAT SHE IS FEELING BETTER BUT STILL HAS SOME BURNING IN VAGINAL AREA SINCE NURSE AT THE HOSPITAL PUT SOME KIND OF POWDER OINTMENT ON HER. PATIENT IS SCHEDULED TO SEE CARDIOLOGY ON 10/30 FOR A FOLLOW UP.     HPI Patient is in today for ED f/u.   Hx of HFpEF, HTN, HLD, T2DM, permanent AFib, OSA with nocturnal hypoxemia on supplemental O2, COPD, hypothyroidism and GERD.  01/28/2024 patient was sent to the ED by PCP due to creatinine increasing to 2.2 and concerns for AKI.   01/28/2024 patient went to the ED and was admitted to the hospital for acute kidney injury superimposed on CKD.  History of hypothyroidism and the patient's TSH was found to be 15 while in the hospital.  CT of the abdomen showed a right renal system fullness and bladder wall thickening and inflammatory changes consistent with cystitis. Urine culture was negative. Patient was given IVF, ceftriaxone  and keflex  while in hospital.  She was also given fosfomycin x 1 day at discharge. Candidal intertrigo of the inguinal area treated with nystatin cream. Patient's AKI improved well while in the hospital.  Discharged on 01/30/2024.   Since discharge the patient reports that she feels well.    Wound care RN is coming to the home 1 x per week.           Objective:    BP 110/74 (BP Location: Right Wrist)   Pulse 69   Temp (!) 97.3 F (36.3 C)   Ht 5' 7 (1.702 m)   Wt 175 lb (79.4 kg)   SpO2 96%   BMI 27.41 kg/m    Physical Exam Vitals reviewed.  Constitutional:      Appearance: Normal appearance.  HENT:     Head: Normocephalic and atraumatic.  Eyes:     Extraocular Movements: Extraocular  movements intact.     Conjunctiva/sclera: Conjunctivae normal.     Pupils: Pupils are equal, round, and reactive to light.  Cardiovascular:     Rate and Rhythm: Normal rate and regular rhythm.     Pulses: Normal pulses.     Heart sounds: Normal heart sounds. No murmur heard.    Comments: Pacemaker palpable to LUC.  Pulmonary:     Effort: Pulmonary effort is normal.     Breath sounds: Normal breath sounds.  Musculoskeletal:        General: No swelling. Normal range of motion.     Cervical back: Normal range of motion.  Skin:    General: Skin is warm and dry.     Capillary Refill: Capillary refill takes less than 2 seconds.     Comments: Quarter sized scabbed wound to the posterior LLE, no erythema or drainage.  No skin break down to the inguinal areas.    Neurological:     General: No focal deficit present.     Mental Status: She is alert and oriented to person, place, and time.  Psychiatric:        Mood and Affect: Mood normal.        Behavior: Behavior normal.     No results found for any visits  on 02/03/24.      Assessment & Plan:   Problem List Items Addressed This Visit       Cardiovascular and Mediastinum   Acute diastolic CHF (congestive heart failure) (HCC)     Respiratory   Pulmonary fibrosis (HCC)     Endocrine   Acquired hypothyroidism   Relevant Orders   TSH + free T4   Type 2 diabetes mellitus (HCC)     Genitourinary   CKD (chronic kidney disease), stage 3b (HCC) - Primary   Relevant Orders   Comprehensive metabolic panel with GFR     Other   Wound of left leg   AKI and CKD - repeat CMP obtained today. Results pending.   Elevated TSH in the hospital-repeat TSH and T4 obtained today.  Results pending  A-fib, CHF, and pacemaker-patient has follow-up with cardiology on 02/18/2024.  Taking Eliquis .  DM2-Controlled with HbA1c 6.6% (last taken on 01/26/2024).  On metformin .  Wound to posterior left leg appears to be healing well.  Patient has a  wound care RN coming to the house once a week.   BP was a little soft today at 110/74 - patient asymptomatic.  Family is going to check the patient's blood pressure over the next couple days let us  know what the readings are.  Patient taking Lasix , atenolol , amlodipine .  Chronic respiratory failure with hypoxia-patient uses 2 L O2 via nasal cannula as needed, reported to be usually at night.    No orders of the defined types were placed in this encounter.   Return in about 3 months (around 05/05/2024).  Lorraine LELON Severin, FNP  I personally spent a total of 30 minutes in the care of the patient today including preparing to see the patient, getting/reviewing separately obtained history, performing a medically appropriate exam/evaluation, placing orders, and documenting clinical information in the EHR.

## 2024-02-03 NOTE — Telephone Encounter (Signed)
 Copied from CRM #[id number]. Topic: Clinical - Home Health Verbal Orders >> Feb 03, 2024 12:15 PM Rosaria BRAVO wrote: Caller/Agency: [facility] RN [facility]  Callback Number: [phone] Service Requested: Skilled Nursing Frequency: Needs a delay for resumption of care  Also wants to know if she needs to continue PT or OT like how she was in the hospital.   Any new concerns about the patient? No

## 2024-02-03 NOTE — Telephone Encounter (Signed)
 LMOVM that this was corrected and faxed back yesterday

## 2024-02-04 ENCOUNTER — Telehealth: Payer: Self-pay | Admitting: Family

## 2024-02-04 ENCOUNTER — Emergency Department (HOSPITAL_COMMUNITY)

## 2024-02-04 ENCOUNTER — Emergency Department (HOSPITAL_COMMUNITY)
Admission: EM | Admit: 2024-02-04 | Discharge: 2024-02-04 | Disposition: A | Discharge status: Home / Self Care | Attending: Emergency Medicine | Admitting: Emergency Medicine

## 2024-02-04 ENCOUNTER — Ambulatory Visit: Payer: Self-pay | Admitting: Family Medicine

## 2024-02-04 ENCOUNTER — Ambulatory Visit: Admitting: Family

## 2024-02-04 ENCOUNTER — Other Ambulatory Visit: Payer: Self-pay

## 2024-02-04 ENCOUNTER — Encounter (HOSPITAL_COMMUNITY): Payer: Self-pay | Admitting: Emergency Medicine

## 2024-02-04 DIAGNOSIS — Z79899 Other long term (current) drug therapy: Secondary | ICD-10-CM | POA: Insufficient documentation

## 2024-02-04 DIAGNOSIS — I129 Hypertensive chronic kidney disease with stage 1 through stage 4 chronic kidney disease, or unspecified chronic kidney disease: Secondary | ICD-10-CM | POA: Diagnosis not present

## 2024-02-04 DIAGNOSIS — R1030 Lower abdominal pain, unspecified: Secondary | ICD-10-CM | POA: Insufficient documentation

## 2024-02-04 DIAGNOSIS — I130 Hypertensive heart and chronic kidney disease with heart failure and stage 1 through stage 4 chronic kidney disease, or unspecified chronic kidney disease: Secondary | ICD-10-CM | POA: Insufficient documentation

## 2024-02-04 DIAGNOSIS — N189 Chronic kidney disease, unspecified: Secondary | ICD-10-CM | POA: Insufficient documentation

## 2024-02-04 DIAGNOSIS — N183 Chronic kidney disease, stage 3 unspecified: Secondary | ICD-10-CM

## 2024-02-04 DIAGNOSIS — R300 Dysuria: Secondary | ICD-10-CM | POA: Diagnosis not present

## 2024-02-04 DIAGNOSIS — R410 Disorientation, unspecified: Secondary | ICD-10-CM | POA: Diagnosis not present

## 2024-02-04 DIAGNOSIS — J449 Chronic obstructive pulmonary disease, unspecified: Secondary | ICD-10-CM | POA: Diagnosis not present

## 2024-02-04 DIAGNOSIS — Z7901 Long term (current) use of anticoagulants: Secondary | ICD-10-CM | POA: Insufficient documentation

## 2024-02-04 DIAGNOSIS — L249 Irritant contact dermatitis, unspecified cause: Secondary | ICD-10-CM | POA: Insufficient documentation

## 2024-02-04 DIAGNOSIS — I5031 Acute diastolic (congestive) heart failure: Secondary | ICD-10-CM

## 2024-02-04 DIAGNOSIS — I509 Heart failure, unspecified: Secondary | ICD-10-CM | POA: Diagnosis not present

## 2024-02-04 DIAGNOSIS — I499 Cardiac arrhythmia, unspecified: Secondary | ICD-10-CM | POA: Diagnosis not present

## 2024-02-04 DIAGNOSIS — Z905 Acquired absence of kidney: Secondary | ICD-10-CM | POA: Diagnosis not present

## 2024-02-04 DIAGNOSIS — Z7951 Long term (current) use of inhaled steroids: Secondary | ICD-10-CM | POA: Insufficient documentation

## 2024-02-04 DIAGNOSIS — E039 Hypothyroidism, unspecified: Secondary | ICD-10-CM | POA: Diagnosis not present

## 2024-02-04 DIAGNOSIS — N179 Acute kidney failure, unspecified: Secondary | ICD-10-CM | POA: Diagnosis not present

## 2024-02-04 LAB — CBC WITH DIFFERENTIAL/PLATELET
Abs Immature Granulocytes: 0.02 K/uL (ref 0.00–0.07)
Basophils Absolute: 0.1 K/uL (ref 0.0–0.1)
Basophils Relative: 1 %
Eosinophils Absolute: 0.2 K/uL (ref 0.0–0.5)
Eosinophils Relative: 3 %
HCT: 42 % (ref 36.0–46.0)
Hemoglobin: 13.2 g/dL (ref 12.0–15.0)
Immature Granulocytes: 0 %
Lymphocytes Relative: 26 %
Lymphs Abs: 1.8 K/uL (ref 0.7–4.0)
MCH: 27.9 pg (ref 26.0–34.0)
MCHC: 31.4 g/dL (ref 30.0–36.0)
MCV: 88.8 fL (ref 80.0–100.0)
Monocytes Absolute: 0.9 K/uL (ref 0.1–1.0)
Monocytes Relative: 13 %
Neutro Abs: 4 K/uL (ref 1.7–7.7)
Neutrophils Relative %: 57 %
Platelets: 273 K/uL (ref 150–400)
RBC: 4.73 MIL/uL (ref 3.87–5.11)
RDW: 18.7 % — ABNORMAL HIGH (ref 11.5–15.5)
WBC: 7 K/uL (ref 4.0–10.5)
nRBC: 0 % (ref 0.0–0.2)

## 2024-02-04 LAB — URINALYSIS, ROUTINE W REFLEX MICROSCOPIC
Bilirubin Urine: NEGATIVE
Glucose, UA: NEGATIVE mg/dL
Ketones, ur: NEGATIVE mg/dL
Nitrite: NEGATIVE
Protein, ur: 100 mg/dL — AB
Specific Gravity, Urine: 1.01 (ref 1.005–1.030)
pH: 6 (ref 5.0–8.0)

## 2024-02-04 LAB — COMPREHENSIVE METABOLIC PANEL WITH GFR
ALT: 14 IU/L (ref 0–32)
ALT: 12 U/L (ref 0–44)
AST: 24 IU/L (ref 0–40)
AST: 27 U/L (ref 15–41)
Albumin: 4 g/dL (ref 3.7–4.7)
Albumin: 4 g/dL (ref 3.5–5.0)
Alkaline Phosphatase: 52 IU/L (ref 48–129)
Alkaline Phosphatase: 49 U/L (ref 38–126)
Anion gap: 15 (ref 5–15)
BUN/Creatinine Ratio: 14 (ref 12–28)
BUN: 26 mg/dL (ref 8–27)
BUN: 25 mg/dL — ABNORMAL HIGH (ref 8–23)
Bilirubin Total: 0.3 mg/dL (ref 0.0–1.2)
CO2: 14 mmol/L — ABNORMAL LOW (ref 20–29)
CO2: 19 mmol/L — ABNORMAL LOW (ref 22–32)
Calcium: 10.3 mg/dL (ref 8.7–10.3)
Calcium: 10.2 mg/dL (ref 8.9–10.3)
Chloride: 107 mmol/L — ABNORMAL HIGH (ref 96–106)
Chloride: 106 mmol/L (ref 98–111)
Creatinine, Ser: 1.82 mg/dL — ABNORMAL HIGH (ref 0.57–1.00)
Creatinine, Ser: 1.76 mg/dL — ABNORMAL HIGH (ref 0.44–1.00)
GFR, Estimated: 28 mL/min — ABNORMAL LOW (ref >60)
Globulin, Total: 2.6 g/dL (ref 1.5–4.5)
Glucose, Bld: 129 mg/dL — ABNORMAL HIGH (ref 70–99)
Glucose: 117 mg/dL — ABNORMAL HIGH (ref 70–99)
Potassium: 5.4 mmol/L — ABNORMAL HIGH (ref 3.5–5.2)
Potassium: 4.2 mmol/L (ref 3.5–5.1)
Sodium: 141 mmol/L (ref 134–144)
Sodium: 139 mmol/L (ref 135–145)
Total Bilirubin: 0.6 mg/dL (ref 0.0–1.2)
Total Protein: 6.6 g/dL (ref 6.0–8.5)
Total Protein: 7.1 g/dL (ref 6.5–8.1)
eGFR: 27 mL/min/1.73 — ABNORMAL LOW (ref >59)

## 2024-02-04 LAB — TSH+FREE T4
Free T4: 0.58 ng/dL — ABNORMAL LOW (ref 0.82–1.77)
TSH: 25.3 u[IU]/mL — ABNORMAL HIGH (ref 0.450–4.500)

## 2024-02-04 LAB — URINALYSIS, MICROSCOPIC (REFLEX)
Bacteria, UA: NONE SEEN
RBC / HPF: >50 RBC/hpf (ref 0–5)

## 2024-02-04 LAB — WET PREP, GENITAL
Clue Cells Wet Prep HPF POC: NONE SEEN
Sperm: NONE SEEN
Trich, Wet Prep: NONE SEEN
WBC, Wet Prep HPF POC: <10 (ref <10)
Yeast Wet Prep HPF POC: NONE SEEN

## 2024-02-04 MED ORDER — FUROSEMIDE 40 MG PO TABS: 40.0000 mg | ORAL_TABLET | ORAL | 0 refills | Status: AC

## 2024-02-04 MED ORDER — GERHARDT'S BUTT CREAM
TOPICAL_CREAM | CUTANEOUS | Status: AC
  Filled 2024-02-04: qty 60

## 2024-02-04 MED ORDER — SODIUM CHLORIDE 0.9 % IV BOLUS
500.0000 mL | Freq: Once | INTRAVENOUS | Status: AC
  Administered 2024-02-04: 500 mL via INTRAVENOUS

## 2024-02-04 MED ORDER — ZINC OXIDE 40 % EX OINT: 1.0000 | TOPICAL_OINTMENT | CUTANEOUS | 0 refills | Status: AC | PRN

## 2024-02-04 NOTE — Telephone Encounter (Signed)
 Can we please and ask why she needed the referral to Neurologists?

## 2024-02-04 NOTE — Telephone Encounter (Signed)
 Please advise on continuing PT or OT since you did hospital FU yesteray.

## 2024-02-04 NOTE — Telephone Encounter (Signed)
 Pt son r/c to [facility] for lab results. Son says that instead of pt coming to office to repeat labs, he will take pt to the hospital. Please call back

## 2024-02-04 NOTE — Telephone Encounter (Signed)
 I called and spoke with patients son, [name], and he confirmed that he is going to go ahead and take patient to Lorraine Leonard to be evaluated, based on her lab results from yesterday.

## 2024-02-04 NOTE — ED Provider Notes (Signed)
 [place] EMERGENCY DEPARTMENT AT [facility] Provider Note   CSN: [id number] Arrival date & time: 02/04/24  1104     Patient presents with: Dysuria   Lorraine Leonard is a 83 y.o. female.   83 yo F with hx of CHF, HTN, HLD, AF, solitary kidney, COPD on nightly O2, and hypothyroidism on synthroid  who presents to the ED with abnormal lab work and lower abdominal pain. Additional hx obtained per Chastity (grandaughter). Thinks the patient hasn't been taking her medications recently. Labs showed worsening renal function with hyperkalemia so they decided to bring her in.  Was recently admitted and discharged for similar symptoms. Has been eating and drinking less than usual as well.        Prior to Admission medications   Medication Sig Start Date End Date Taking? Authorizing Provider  liver oil-zinc oxide (DESITIN) 40 % ointment Apply 1 Application topically as needed for irritation. 02/04/24  Yes Yolande Lamar BROCKS, MD  acetaminophen  (TYLENOL ) 325 MG tablet Take 2 tablets (650 mg total) by mouth every 6 (six) hours as needed for mild pain (pain score 1-3) or fever (or Fever >/= 101). 11/26/23   Emokpae, Courage, MD  albuterol  (VENTOLIN  HFA) 108 (90 Base) MCG/ACT inhaler INHALE 2 PUFFS INTO THE LUNGS EVERY 6 HOURS AS NEEDED FOR WHEEZE OR SHORTNESS OF BREATH Patient taking differently: Inhale 2 puffs into the lungs every 6 (six) hours as needed for wheezing or shortness of breath. 11/26/23   Pearlean Manus, MD  amLODipine  (NORVASC ) 5 MG tablet Take 0.5 tablets (2.5 mg total) by mouth daily. 11/26/23   Pearlean Manus, MD  apixaban  (ELIQUIS ) 5 MG TABS tablet Take 1 tablet (5 mg total) by mouth 2 (two) times daily. 01/26/24   Lavell Bari LABOR, FNP  atenolol  (TENORMIN ) 25 MG tablet Take 1 tablet (25 mg total) by mouth daily. 01/26/24   Lavell Bari LABOR, FNP  calcium  citrate (CALCITRATE - DOSED IN MG ELEMENTAL CALCIUM ) 950 MG tablet Take 1 tablet by mouth daily.    [provider]   cephALEXin  (KEFLEX ) 250 MG capsule Take 250 mg by mouth daily. 01/24/24   [provider]  cyanocobalamin  1000 MCG tablet Take 1,000 mcg by mouth daily. Vitamin b12 every AM    [provider]  fluticasone  (FLONASE ) 50 MCG/ACT nasal spray PLACE 1 SPRAY INTO BOTH NOSTRILS 2 (TWO) TIMES DAILY AS NEEDED FOR ALLERGIES OR RHINITIS. 09/16/23   Lavell Bari A, FNP  furosemide  (LASIX ) 40 MG tablet Take 1 tablet (40 mg total) by mouth every other day. 02/04/24   Yolande Lamar BROCKS, MD  Iron , Ferrous Sulfate , 325 (65 Fe) MG TABS Take 325 mg by mouth daily. 01/26/24   Lavell Bari A, FNP  levocetirizine (XYZAL ) 5 MG tablet TAKE 1 TABLET BY MOUTH EVERY DAY IN THE EVENING 08/25/23   Lavell Bari A, FNP  levothyroxine  (SYNTHROID ) 100 MCG tablet TAKE 1 TABLET BY MOUTH EVERY DAY 02/02/24   Hawks, Bari LABOR, FNP  Multiple Vitamins-Minerals (PRESERVISION AREDS 2) CAPS Take 1 capsule by mouth 2 (two) times daily.    [provider]  nystatin (MYCOSTATIN/NYSTOP) powder Apply 1 Application topically 3 (three) times daily. 01/26/24   Lavell Bari A, FNP  Olopatadine  HCl 0.2 % SOLN INSTILL 1 DROP INTO BOTH EYES EVERY DAY 04/30/20   Lavell Bari A, FNP  omeprazole  (PRILOSEC) 20 MG capsule Take 1 capsule (20 mg total) by mouth daily. 06/05/23   Lavell Bari LABOR, FNP  Polyethyl Glycol-Propyl Glycol (SYSTANE)  0.4-0.3 % GEL ophthalmic gel Place 1 application into both eyes at bedtime as needed (dry eyes).    [provider]  predniSONE  (DELTASONE ) 5 MG tablet TAKE 1 TABLET (5 MG TOTAL) BY MOUTH 2 (TWO) TIMES DAILY WITH A MEAL. 06/18/23   Lavell Bari LABOR, FNP  vitamin C  (ASCORBIC ACID ) 500 MG tablet Take 1,000 mg by mouth daily. AM    [provider]  Vitamin D , Ergocalciferol , (DRISDOL ) 1.25 MG (50000 UNIT) CAPS capsule TAKE 1 CAPSULE (50,000 UNITS TOTAL) BY MOUTH EVERY 7 (SEVEN) DAYS 09/26/22   Lavell Bari LABOR, FNP    Allergies: Macrodantin  [nitrofurantoin  macrocrystal], Mobic   [meloxicam ], and Allopurinol     Review of Systems  Updated Vital Signs BP 113/60 (BP Location: Left Arm)   Pulse 63   Temp 97.9 F (36.6 C) (Oral)   Resp 20   Ht 5' 7 (1.702 m)   Wt 79 kg   SpO2 97%   BMI 27.28 kg/m   Physical Exam Vitals and nursing note reviewed.  Constitutional:      General: She is not in acute distress.    Appearance: She is well-developed.  HENT:     Head: Normocephalic and atraumatic.     Right Ear: External ear normal.     Left Ear: External ear normal.     Nose: Nose normal.  Eyes:     Extraocular Movements: Extraocular movements intact.     Conjunctiva/sclera: Conjunctivae normal.     Pupils: Pupils are equal, round, and reactive to light.  Cardiovascular:     Rate and Rhythm: Normal rate and regular rhythm.     Heart sounds: No murmur heard. Pulmonary:     Effort: Pulmonary effort is normal. No respiratory distress.     Breath sounds: Normal breath sounds.  Abdominal:     General: Abdomen is flat. There is no distension.     Palpations: Abdomen is soft. There is no mass.     Tenderness: There is abdominal tenderness (suprapubic). There is no guarding.  Genitourinary:    Comments: Chaperoned by RN Leotis.  External exam with mild erythema and irritation of the labia and medial thighs  Speculum exam with normal appearing whitish vaginal discharge.  Vaginal wall mucosa is unremarkable.  Cervix visualized and is unremarkable (closed in appearance without any protruding material). Musculoskeletal:     Cervical back: Normal range of motion and neck supple.     Right lower leg: No edema.     Left lower leg: No edema.  Skin:    General: Skin is warm and dry.  Neurological:     Mental Status: She is alert and oriented to person, place, and time. Mental status is at baseline.  Psychiatric:        Mood and Affect: Mood normal.     (all labs ordered are listed, but only abnormal results are displayed) Labs Reviewed  URINALYSIS, ROUTINE  W REFLEX MICROSCOPIC - Abnormal; Notable for the following components:      Result Value   Hgb urine dipstick LARGE (*)    Protein, ur 100 (*)    Leukocytes,Ua SMALL (*)    All other components within normal limits  CBC WITH DIFFERENTIAL/PLATELET - Abnormal; Notable for the following components:   RDW 18.7 (*)    All other components within normal limits  COMPREHENSIVE METABOLIC PANEL WITH GFR - Abnormal; Notable for the following components:   CO2 19 (*)    Glucose, Bld 129 (*)  BUN 25 (*)    Creatinine, Ser 1.76 (*)    GFR, Estimated 28 (*)    All other components within normal limits  WET PREP, GENITAL  URINE CULTURE  URINALYSIS, MICROSCOPIC (REFLEX)    EKG: None  Radiology: CT ABDOMEN PELVIS WO CONTRAST Result Date: 02/04/2024 EXAM: CT ABDOMEN AND PELVIS WITHOUT CONTRAST 02/04/2024 12:35:00 PM TECHNIQUE: CT of the abdomen and pelvis was performed without the administration of intravenous contrast. Multiplanar reformatted images are provided for review. Automated exposure control, iterative reconstruction, and/or weight-based adjustment of the mA/kV was utilized to reduce the radiation dose to as low as reasonably achievable. COMPARISON: 01/28/2024 CLINICAL HISTORY: AKI, lower abdominal pain. Pt bib [facility] from [facility] for abnormal kidney function and dysuria. Pt was recently treated for UTI. FINDINGS: LOWER CHEST: No acute abnormality. LIVER: The liver is unremarkable. GALLBLADDER AND BILE DUCTS: Gallbladder is unremarkable. No biliary ductal dilatation. SPLEEN: No acute abnormality. PANCREAS: No acute abnormality. ADRENAL GLANDS: No acute abnormality. KIDNEYS, URETERS AND BLADDER: Status post left nephrectomy. No stones in the right kidney or ureter. No right hydronephrosis. No perinephric or periureteral stranding. Mild urinary bladder wall thickening is noted suggesting the possibility of cystitis. GI AND BOWEL: Stomach demonstrates no acute abnormality.  There is no bowel obstruction. PERITONEUM AND RETROPERITONEUM: No ascites. No free air. VASCULATURE: Aorta is normal in caliber. Aortic atherosclerosis. LYMPH NODES: No lymphadenopathy. REPRODUCTIVE ORGANS: Status post hysterectomy. BONES AND SOFT TISSUES: Post surgical changes seen in lower lumbar spine. No focal soft tissue abnormality. IMPRESSION: 1. Mild urinary bladder wall thickening, suggestive of cystitis. Electronically signed by: Lynwood Seip MD 02/04/2024 01:28 PM EDT RP Workstation: [id number]     Procedures   Medications Ordered in the ED  Gerhardt's butt cream ( Topical Not Given 02/04/24 1421)  sodium chloride  0.9 % bolus 500 mL (0 mLs Intravenous Stopped 02/04/24 1553)    Clinical Course as of 02/04/24 1629  Thu Feb 04, 2024  1144 Attempted to call son [name] twice without response. [RP]  1422 Dw Dr Jerrye who recommends holding metformin  and increasing the 40 mg lasix  to every other day. If she starts swelling can go back to daily. They will set up a clinic appointment.   [RP]  1529 Daughter Al updated.  Discussed placement due to concerns with patient's medication compliance and recent deterioration in her health.  They state that they want for her to go home and not be placed at this point in time. [RP]  1531 Called granddaughter Chastity and discussed medication changes with her. [RP]    Clinical Course User Index [RP] Yolande Lamar BROCKS, MD                                 Medical Decision Making Amount and/or Complexity of Data Reviewed Labs: ordered. Radiology: ordered.  Risk OTC drugs. Prescription drug management.   MONSERATT LEDIN is a 83 yo F with hx of CHF, HTN, HLD, AF, solitary kidney, COPD on nightly O2, and hypothyroidism on synthroid  who presents to the ED with abnormal lab work and lower abdominal pain.   Initial Ddx:  AKI, dehydration, obstruction, UTI, pyelonephritis, interstitial cystitis, medication side effect  MDM/Course:  Patient presents  emergency department for abnormal blood work.  Yesterday had abnormal thyroid  function tests as well as elevated potassium and creatinine.  On exam she is not in acute distress.  Does not appear volume overloaded.  Is complaining of a rash on her inner thighs and labia which has been present for several weeks.  Says it is quite irritating and painful.  She is incontinent of urine.  I suspect this is likely the cause.  Low concern for Fournier's gangrene at this point in time.  She had blood work that was drawn that showed that her creatinine had improved since admission to 1.76 and her potassium is 4.2.  Urinalysis has some blood but no other signs of infection at this point in time.  Will send a urine culture off since the family is concerned about a possible UTI.  Did send a wet prep during her pelvic exam that did not show acute abnormality.  CT abdomen pelvis did not show any signs of obstruction that would cause her decline in renal function but does show some thickening of the bladder which I suspect is from chronic inflammation.  Discussed with nephrology who wants her to discontinue her metformin  and change her Lasix  to every other day.  I suspect that dehydration may be playing a role but we need to be careful with her fluid status given her history of CHF.  They will see her in clinic.  They give a small fluid bolus.  Called the family and recommended her taken at least 1.5 L of fluid per day and have her medications with them.  Sounds like the patient and family are not ready for placement at this point in time so I have ordered health nursing for specially to help with her medication management.  Will have her fu with urology about her dysuria. Will call about initiating abx if culture is positive.   This patient presents to the ED for concern of complaints listed in HPI, this involves an extensive number of treatment options, and is a complaint that carries with it a high risk of complications and  morbidity. Disposition including potential need for admission considered.   Dispo: DC Home. Return precautions discussed including, but not limited to, those listed in the AVS. Allowed pt time to ask questions which were answered fully prior to dc.  Additional history obtained from family Records reviewed Outpatient Clinic Notes The following labs were independently interpreted: Chemistry and show CKD I independently reviewed the following imaging with scope of interpretation limited to determining acute life threatening conditions related to emergency care: CT Abdomen/Pelvis and agree with the radiologist interpretation with the following exceptions: none I personally reviewed and interpreted cardiac monitoring: normal sinus rhythm  I personally reviewed and interpreted the pt's EKG: see above for interpretation  I have reviewed the patients home medications and made adjustments as needed Consults: Nephrology Social Determinants of health:  Geriatric  Portions of this note were generated with Scientist, clinical (histocompatibility and immunogenetics). Dictation errors may occur despite best attempts at proofreading.     Final diagnoses:  Chronic kidney disease, unspecified CKD stage  Dysuria  Irritant dermatitis    ED Discharge Orders          Ordered    furosemide  (LASIX ) 40 MG tablet  Every other day        02/04/24 1540    Home Health        02/04/24 1540    Face-to-face encounter (required for Medicare/Medicaid patients)       Comments: I Lamar JAYSON Shan certify that this patient is under my care and that I, or a nurse practitioner or physician's assistant working with me, had a face-to-face encounter that meets the  physician face-to-face encounter requirements with this patient on 02/04/2024. The encounter with the patient was in whole, or in part for the following medical condition(s) which is the primary reason for home health care (List medical condition): Difficulty with medication management   02/04/24  1540    liver oil-zinc oxide (DESITIN) 40 % ointment  As needed        02/04/24 1541               Yolande Lamar BROCKS, MD 02/04/24 [phone]

## 2024-02-04 NOTE — ED Triage Notes (Signed)
 Pt bib [facility] from [facility] for abnormal kidney function and dysuria. Pt was recently treated for UTI.

## 2024-02-04 NOTE — ED Notes (Signed)
 ED Provider at bedside.

## 2024-02-04 NOTE — Telephone Encounter (Signed)
 Based on Hospital Discharge summary from Dr Bernardino Come on 01/28/24, he recommended patient contact her PCP about getting referral to see [facility] Neurology for neuropsychiatric battery.

## 2024-02-04 NOTE — Telephone Encounter (Signed)
 Call back to [facility] nurse w/ [facility] VO given for resumption of care and per visit w/ [name] Severin, NP yesterday he feels pt would benefit from continued PT/OT.

## 2024-02-04 NOTE — Telephone Encounter (Signed)
Attempted to contact - NVM 

## 2024-02-04 NOTE — Discharge Instructions (Signed)
 You were seen for your abnormal lab work in the emergency department.  The lab work is improved from when you are in the hospital.  At home, please discontinue your metformin  and take your Lasix  every other day.  Be sure that you are drinking 1.5 L of water per day to ensure that your kidney function does not worsen.    Use the cream for your pain and rash in your groin.  The social workers will be reaching out to you about a home health nurse to assist with your medication management.  Check your MyChart online for the results of any tests that had not resulted by the time you left the emergency department.  This includes the urine culture that was sent  Follow-up with your primary doctor in 2-3 days regarding your visit.  Nephrology will reach out to you about an appointment but if you do not hear from them please call using the information in this packet.  Follow-up with your urologist about your frequent urinary tract infections.  Return immediately to the emergency department if you experience any of the following: Fevers, difficulty breathing, or any other concerning symptoms.    Thank you for visiting our Emergency Department. It was a pleasure taking care of you today.

## 2024-02-04 NOTE — ED Notes (Signed)
Waiting for pt's ride.

## 2024-02-05 ENCOUNTER — Telehealth: Payer: Self-pay | Admitting: Family Medicine

## 2024-02-05 LAB — URINE CULTURE: Culture: NO GROWTH

## 2024-02-05 NOTE — Addendum Note (Signed)
 Addended by: LAVELL LYE A on: 02/05/2024 07:51 AM   Modules accepted: Orders

## 2024-02-05 NOTE — Telephone Encounter (Signed)
 Referral placed.

## 2024-02-05 NOTE — Telephone Encounter (Signed)
 Copied from CRM [id number]. Topic: Clinical - Home Health Verbal Orders >> Feb 05, 2024 11:19 AM Leonette SQUIBB wrote: Caller/Agency: [facility] . [facility]  Callback Number: [phone]  Service Requested: Skilled Nursing  delay of care/   Frequency: eval Monday 02/08/24  Any new concerns about the patient? No

## 2024-02-05 NOTE — Telephone Encounter (Signed)
 Verbal given

## 2024-02-08 DIAGNOSIS — Z9981 Dependence on supplemental oxygen: Secondary | ICD-10-CM | POA: Diagnosis not present

## 2024-02-08 DIAGNOSIS — Z7984 Long term (current) use of oral hypoglycemic drugs: Secondary | ICD-10-CM | POA: Diagnosis not present

## 2024-02-08 DIAGNOSIS — E1140 Type 2 diabetes mellitus with diabetic neuropathy, unspecified: Secondary | ICD-10-CM | POA: Diagnosis not present

## 2024-02-08 DIAGNOSIS — H3530 Unspecified macular degeneration: Secondary | ICD-10-CM | POA: Diagnosis not present

## 2024-02-08 DIAGNOSIS — J449 Chronic obstructive pulmonary disease, unspecified: Secondary | ICD-10-CM | POA: Diagnosis not present

## 2024-02-08 DIAGNOSIS — D631 Anemia in chronic kidney disease: Secondary | ICD-10-CM | POA: Diagnosis not present

## 2024-02-08 DIAGNOSIS — I130 Hypertensive heart and chronic kidney disease with heart failure and stage 1 through stage 4 chronic kidney disease, or unspecified chronic kidney disease: Secondary | ICD-10-CM | POA: Diagnosis not present

## 2024-02-08 DIAGNOSIS — Z7901 Long term (current) use of anticoagulants: Secondary | ICD-10-CM | POA: Diagnosis not present

## 2024-02-08 DIAGNOSIS — R040 Epistaxis: Secondary | ICD-10-CM | POA: Diagnosis not present

## 2024-02-08 DIAGNOSIS — I5032 Chronic diastolic (congestive) heart failure: Secondary | ICD-10-CM | POA: Diagnosis not present

## 2024-02-08 DIAGNOSIS — N1831 Chronic kidney disease, stage 3a: Secondary | ICD-10-CM | POA: Diagnosis not present

## 2024-02-08 DIAGNOSIS — E039 Hypothyroidism, unspecified: Secondary | ICD-10-CM | POA: Diagnosis not present

## 2024-02-08 DIAGNOSIS — Z556 Problems related to health literacy: Secondary | ICD-10-CM | POA: Diagnosis not present

## 2024-02-08 DIAGNOSIS — J8410 Pulmonary fibrosis, unspecified: Secondary | ICD-10-CM | POA: Diagnosis not present

## 2024-02-08 DIAGNOSIS — M069 Rheumatoid arthritis, unspecified: Secondary | ICD-10-CM | POA: Diagnosis not present

## 2024-02-08 DIAGNOSIS — Z79899 Other long term (current) drug therapy: Secondary | ICD-10-CM | POA: Diagnosis not present

## 2024-02-08 DIAGNOSIS — E1122 Type 2 diabetes mellitus with diabetic chronic kidney disease: Secondary | ICD-10-CM | POA: Diagnosis not present

## 2024-02-08 DIAGNOSIS — M109 Gout, unspecified: Secondary | ICD-10-CM | POA: Diagnosis not present

## 2024-02-08 DIAGNOSIS — I4891 Unspecified atrial fibrillation: Secondary | ICD-10-CM | POA: Diagnosis not present

## 2024-02-08 DIAGNOSIS — S81802D Unspecified open wound, left lower leg, subsequent encounter: Secondary | ICD-10-CM | POA: Diagnosis not present

## 2024-02-09 ENCOUNTER — Telehealth: Payer: Self-pay

## 2024-02-09 NOTE — Telephone Encounter (Signed)
 Copied from CRM #[id number]. Topic: General - Other >> Feb 08, 2024  5:05 PM Everette C wrote: Reason for CRM: The patient's daughter has called for an update to [facility] forms. The form indicates that the patient has not been seen by their PCP within 90 days which would be incorrect. The patient's daughter would like to be contacted when the form is corrected/updated. Please contact further if/when possible

## 2024-02-09 NOTE — Telephone Encounter (Signed)
 Re-typed and placed on PCP's desk for signature

## 2024-02-10 ENCOUNTER — Other Ambulatory Visit: Payer: Self-pay | Admitting: Family

## 2024-02-10 DIAGNOSIS — N1831 Chronic kidney disease, stage 3a: Secondary | ICD-10-CM | POA: Diagnosis not present

## 2024-02-10 DIAGNOSIS — I129 Hypertensive chronic kidney disease with stage 1 through stage 4 chronic kidney disease, or unspecified chronic kidney disease: Secondary | ICD-10-CM | POA: Diagnosis not present

## 2024-02-10 DIAGNOSIS — R0982 Postnasal drip: Secondary | ICD-10-CM

## 2024-02-10 DIAGNOSIS — Z905 Acquired absence of kidney: Secondary | ICD-10-CM | POA: Diagnosis not present

## 2024-02-10 DIAGNOSIS — I5032 Chronic diastolic (congestive) heart failure: Secondary | ICD-10-CM | POA: Diagnosis not present

## 2024-02-10 DIAGNOSIS — E1122 Type 2 diabetes mellitus with diabetic chronic kidney disease: Secondary | ICD-10-CM | POA: Diagnosis not present

## 2024-02-10 DIAGNOSIS — N179 Acute kidney failure, unspecified: Secondary | ICD-10-CM | POA: Diagnosis not present

## 2024-02-11 ENCOUNTER — Other Ambulatory Visit: Payer: Self-pay | Admitting: Family

## 2024-02-11 DIAGNOSIS — R413 Other amnesia: Secondary | ICD-10-CM

## 2024-02-11 LAB — LAB REPORT - SCANNED: EGFR: 25

## 2024-02-12 NOTE — Telephone Encounter (Signed)
 Aware PCS form has been updated at faxed to [facility]

## 2024-02-17 ENCOUNTER — Ambulatory Visit (INDEPENDENT_AMBULATORY_CARE_PROVIDER_SITE_OTHER)

## 2024-02-17 VITALS — BP 110/74 | HR 69 | Ht 67.0 in | Wt 175.0 lb

## 2024-02-17 DIAGNOSIS — Z0000 Encounter for general adult medical examination without abnormal findings: Secondary | ICD-10-CM | POA: Diagnosis not present

## 2024-02-17 NOTE — Progress Notes (Signed)
 Subjective:   Lorraine Leonard is a 83 y.o. who presents for a Medicare Wellness preventive visit.  As a reminder, Annual Wellness Visits don't include a physical exam, and some assessments may be limited, especially if this visit is performed virtually. We may recommend an in-person follow-up visit with your provider if needed.  Visit Complete: Virtual I connected with  Lorraine Leonard on 02/17/24 by a audio enabled telemedicine application and verified that I am speaking with the correct person using two identifiers.  Patient Location: Home  Provider Location: Office/Clinic  I discussed the limitations of evaluation and management by telemedicine. The patient expressed understanding and agreed to proceed.  Vital Signs: Because this visit was a virtual/telehealth visit, some criteria may be missing or patient reported. Any vitals not documented were not able to be obtained and vitals that have been documented are patient reported.  VideoDeclined- This patient declined Librarian, academic. Therefore the visit was completed with audio only.  Persons Participating in Visit: Patient.  AWV Questionnaire: No: Patient Medicare AWV questionnaire was not completed prior to this visit.  Cardiac Risk Factors include: advanced age (>72men, >30 women);diabetes mellitus;dyslipidemia;hypertension     Objective:    Today's Vitals   02/17/24 1352  BP: 110/74  Pulse: 69  Weight: 175 lb (79.4 kg)  Height: 5' 7 (1.702 m)   Body mass index is 27.41 kg/m.     02/17/2024    2:06 PM 02/04/2024   11:17 AM 01/29/2024   12:00 AM 01/28/2024    6:38 PM 01/13/2024    5:36 AM 11/24/2023    4:39 PM 09/10/2023    5:56 PM  Advanced Directives  Does Patient Have a Medical Advance Directive? No No No No No No No  Would patient like information on creating a medical advance directive? No - Patient declined No - Patient declined No - Patient declined No - Patient declined  No -  Patient declined No - Patient declined    Current Medications (verified) Outpatient Encounter Medications as of 02/17/2024  Medication Sig   acetaminophen  (TYLENOL ) 325 MG tablet Take 2 tablets (650 mg total) by mouth every 6 (six) hours as needed for mild pain (pain score 1-3) or fever (or Fever >/= 101).   albuterol  (VENTOLIN  HFA) 108 (90 Base) MCG/ACT inhaler INHALE 2 PUFFS INTO THE LUNGS EVERY 6 HOURS AS NEEDED FOR WHEEZE OR SHORTNESS OF BREATH (Patient taking differently: Inhale 2 puffs into the lungs every 6 (six) hours as needed for wheezing or shortness of breath.)   apixaban  (ELIQUIS ) 5 MG TABS tablet Take 1 tablet (5 mg total) by mouth 2 (two) times daily.   atenolol  (TENORMIN ) 25 MG tablet Take 1 tablet (25 mg total) by mouth daily.   calcium  citrate (CALCITRATE - DOSED IN MG ELEMENTAL CALCIUM ) 950 MG tablet Take 1 tablet by mouth daily.   cephALEXin  (KEFLEX ) 250 MG capsule Take 250 mg by mouth daily.   cyanocobalamin  1000 MCG tablet Take 1,000 mcg by mouth daily. Vitamin b12 every AM   fluticasone  (FLONASE ) 50 MCG/ACT nasal spray PLACE 1 SPRAY INTO BOTH NOSTRILS 2 (TWO) TIMES DAILY AS NEEDED FOR ALLERGIES OR RHINITIS.   furosemide  (LASIX ) 40 MG tablet Take 1 tablet (40 mg total) by mouth every other day.   Iron , Ferrous Sulfate , 325 (65 Fe) MG TABS Take 325 mg by mouth daily.   levocetirizine (XYZAL ) 5 MG tablet TAKE 1 TABLET BY MOUTH EVERY DAY IN THE EVENING   levothyroxine  (  SYNTHROID ) 100 MCG tablet TAKE 1 TABLET BY MOUTH EVERY DAY   liver oil-zinc oxide (DESITIN) 40 % ointment Apply 1 Application topically as needed for irritation.   Multiple Vitamins-Minerals (PRESERVISION AREDS 2) CAPS Take 1 capsule by mouth 2 (two) times daily.   nystatin (MYCOSTATIN/NYSTOP) powder Apply 1 Application topically 3 (three) times daily.   Olopatadine  HCl 0.2 % SOLN INSTILL 1 DROP INTO BOTH EYES EVERY DAY   omeprazole  (PRILOSEC) 20 MG capsule Take 1 capsule (20 mg total) by mouth daily.    Polyethyl Glycol-Propyl Glycol (SYSTANE) 0.4-0.3 % GEL ophthalmic gel Place 1 application into both eyes at bedtime as needed (dry eyes).   predniSONE  (DELTASONE ) 5 MG tablet TAKE 1 TABLET (5 MG TOTAL) BY MOUTH 2 (TWO) TIMES DAILY WITH A MEAL.   vitamin C  (ASCORBIC ACID ) 500 MG tablet Take 1,000 mg by mouth daily. AM   Vitamin D , Ergocalciferol , (DRISDOL ) 1.25 MG (50000 UNIT) CAPS capsule TAKE 1 CAPSULE (50,000 UNITS TOTAL) BY MOUTH EVERY 7 (SEVEN) DAYS   [DISCONTINUED] amLODipine  (NORVASC ) 5 MG tablet Take 0.5 tablets (2.5 mg total) by mouth daily.   No facility-administered encounter medications on file as of 02/17/2024.    Allergies (verified) Macrodantin  [nitrofurantoin  macrocrystal], Mobic  [meloxicam ], and Allopurinol    History: Past Medical History:  Diagnosis Date   Allergic rhinitis    Anxiety    Anxiety    Arthritis    ~ all my joints (05/18/2017)   Atrial fibrillation (HCC) 09/2016   Atrial fibrillation with RVR (HCC) 05/18/2017   Chronic heart failure with preserved ejection fraction (HFpEF) (HCC)    Chronic kidney disease, stage 3a (HCC)    Chronic lower back pain    COPD (chronic obstructive pulmonary disease) (HCC)    Depression    Diverticulosis    GERD (gastroesophageal reflux disease)    Glaucoma    Gout    History of blood transfusion 1983   when I gave a 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   Hyperlipidemia    Hypertension    Hypothyroidism    Macular degeneration of both eyes    Neuropathy    On home oxygen  therapy    3L; 24/7 (05/18/2017)   Pre-diabetes    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   Pulmonary fibrosis (HCC)    Rheumatoid arthritis (HCC)    Sleep apnea    uses 3 liters of o2 24/7   Urticaria    Past Surgical History:  Procedure Laterality Date   AV NODE ABLATION  05/18/2017   AV NODE ABLATION N/A 05/18/2017   Procedure: AV NODE ABLATION;  Surgeon: Waddell Danelle ORN, MD;  Location: [facility];  Service: Cardiovascular;   Laterality: N/A;   BACK SURGERY     BREAST LUMPECTOMY Left    CARDIOVERSION N/A 01/30/2017   Procedure: CARDIOVERSION;  Surgeon: Delford Maude BROCKS, MD;  Location: [facility];  Service: Cardiovascular;  Laterality: N/A;   CARDIOVERSION N/A 04/09/2017   Procedure: CARDIOVERSION;  Surgeon: Debera Jayson MATSU, MD;  Location: [facility];  Service: Cardiovascular;  Laterality: N/A;   CATARACT EXTRACTION W/ INTRAOCULAR LENS  IMPLANT, BILATERAL Bilateral    DILATION AND CURETTAGE OF UTERUS     EUS N/A 10/03/2015   Procedure: UPPER ENDOSCOPIC ULTRASOUND (EUS) RADIAL;  Surgeon: Elsie Cree, MD;  Location: [facility];  Service: Endoscopy;  Laterality: N/A;   FINE NEEDLE ASPIRATION N/A 10/03/2015   Procedure: FINE NEEDLE ASPIRATION (FNA) RADIAL;  Surgeon: Elsie Cree, MD;  Location: [facility];  Service: Endoscopy;  Laterality: N/A;   INSERT / REPLACE / REMOVE PACEMAKER  05/18/2017   LUMBAR LAMINECTOMY  1995; 06/23/2008   L4-5/notes 08/20/2010   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   NEPHRECTOMY TRANSPLANTED ORGAN     PACEMAKER IMPLANT N/A 05/18/2017   Procedure: PACEMAKER IMPLANT;  Surgeon: Waddell Danelle ORN, MD;  Location: [facility];  Service: Cardiovascular;  Laterality: N/A;   TOTAL ABDOMINAL HYSTERECTOMY     TUBAL LIGATION     Family History  Problem Relation Age of Onset   Diabetes Other    Hypertension Other    Coronary artery disease Other    Stroke Other    Asthma Other    Kidney disease Sister    Cancer Sister        lung   Lung cancer Sister    Cancer Sister        lung   Lung cancer Sister    Cancer Sister        lung   Suicidality Son        committed suicide in 2009   CVA Son    Hyperlipidemia Other    Anxiety disorder Other    Migraines Other    Heart failure Father    Healthy Brother    Cancer Sister        lung   Healthy Sister    Social History   Socioeconomic History   Marital status: Widowed    Spouse name: Naome Brigandi   Number of children: 4   Years of education: 9   Highest education level: 9th grade  Occupational History   Occupation: retired    Comment: cna  Tobacco Use   Smoking status: Never    Passive exposure: Never   Smokeless tobacco: Never  Vaping Use   Vaping status: Never Used  Substance and Sexual Activity   Alcohol  use: No   Drug use: No   Sexual activity: Not Currently    Partners: Male    Birth control/protection: Surgical  Other Topics Concern   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   Pt lives alone in single wide trailer. They have 4 children and 13 grandchildren.    Social Drivers of Corporate Investment Banker Strain: Low Risk  (02/17/2024)   Overall Financial Resource Strain (CARDIA)    Difficulty of Paying Living Expenses: Not very hard  Food Insecurity: No Food Insecurity (02/17/2024)   Hunger Vital Sign    Worried About Running Out of Food in the Last Year: Never true    Ran Out of Food in the Last Year: Never true  Recent Concern: Food Insecurity - Food Insecurity Present (12/29/2023)   Hunger Vital Sign    Worried About Running Out of Food in the Last Year: Sometimes true    Ran Out of Food in the Last Year: Never true  Transportation Needs: Unmet Transportation Needs (02/17/2024)   PRAPARE - Administrator, Civil Service (Medical): Yes    Lack of Transportation (Non-Medical): No  Physical Activity: Inactive (02/17/2024)   Exercise Vital Sign    Days of Exercise per Week: 0 days    Minutes of Exercise per Session: 0 min  Stress: No Stress Concern Present (02/17/2024)   [employer] of Occupational Health - Occupational Stress Questionnaire    Feeling  of Stress: Not at all  Recent Concern: Stress - Stress Concern Present (12/29/2023)   [employer] of Occupational Health - Occupational Stress Questionnaire    Feeling of Stress: To some extent  Social  Connections: Socially Isolated (02/17/2024)   Social Connection and Isolation Panel    Frequency of Communication with Friends and Family: More than three times a week    Frequency of Social Gatherings with Friends and Family: More than three times a week    Attends Religious Services: Never    Database Administrator or Organizations: No    Attends Banker Meetings: Never    Marital Status: Widowed    Tobacco Counseling Counseling given: Yes    Clinical Intake:  Pre-visit preparation completed: Yes  Pain : No/denies pain     BMI - recorded: 27.41 Nutritional Status: BMI 25 -29 Overweight Nutritional Risks: None Diabetes: No  Lab Results  Component Value Date   HGBA1C 6.6 (H) 01/26/2024   HGBA1C 6.9 (H) 11/24/2023   HGBA1C 6.5 (H) 12/09/2022     How often do you need to have someone help you when you read instructions, pamphlets, or other written materials from your doctor or pharmacy?: 1 - Never  Interpreter Needed?: No  Information entered by :: alia t/cma   Activities of Daily Living     02/17/2024    2:03 PM 01/29/2024    5:41 AM  In your present state of health, do you have any difficulty performing the following activities:  Hearing? 1   Vision? 1   Difficulty concentrating or making decisions? 1   Walking or climbing stairs? 0   Dressing or bathing? 0   Doing errands, shopping? 1 1  Comment pt's grand kids   Preparing Food and eating ? N   Using the Toilet? N   In the past six months, have you accidently leaked urine? N   Do you have problems with loss of bowel control? N   Managing your Medications? Y   Comment pt's grand daughter   Managing your Finances? Y   Comment pt's son   Housekeeping or managing your Housekeeping? N     Patient Care Team: Lavell Bari LABOR, FNP as PCP - General (Family Medicine) Waddell Danelle ORN, MD as PCP - Electrophysiology (Cardiology) Alvan Dorn FALCON, MD as PCP - Cardiology (Cardiology) Darlean Ozell NOVAK, MD as Consulting Physician (Pulmonary Disease) Matilda Senior, MD as Consulting Physician (Urology) Vicci Mcardle, OD (Optometry)  I have updated your Care Teams any recent Medical Services you may have received from other providers in the past year.     Assessment:   This is a routine wellness examination for Lorraine Leonard.  Hearing/Vision screen Hearing Screening - Comments:: Pt denies hearing dif  Vision Screening - Comments:: Pt wear glasses/pt goes [facility]/last ov 3 yrs or so   Goals Addressed             This Visit's Progress    Exercise 150 min/wk Moderate Activity   On track    Chair exercises daily. Handout given.        Depression Screen     02/17/2024    2:07 PM 02/03/2024    3:41 PM 01/26/2024    3:40 PM 10/22/2023    3:04 PM 09/17/2023   12:00 PM 05/22/2023   10:55 AM 12/09/2022    2:46 PM  PHQ 2/9 Scores  PHQ - 2 Score 0 0 3 0 1 1 0  PHQ-  9 Score 0 0 11 5 4 9  0    Fall Risk     02/17/2024    1:58 PM 02/03/2024    3:40 PM 01/26/2024    3:40 PM 10/22/2023    3:04 PM 12/09/2022    2:46 PM  Fall Risk   Falls in the past year? 0 0 1 1 0  Number falls in past yr: 0 0 0 0 0  Injury with Fall? 0 0 0 1 0  Risk for fall due to : No Fall Risks History of fall(s);Impaired balance/gait History of fall(s);Orthopedic patient Impaired mobility No Fall Risks  Follow up Falls evaluation completed Falls evaluation completed Falls evaluation completed Falls evaluation completed;Education provided Falls evaluation completed    MEDICARE RISK AT HOME:  Medicare Risk at Home Any stairs in or around the home?: No If so, are there any without handrails?: No Home free of loose throw rugs in walkways, pet beds, electrical cords, etc?: Yes Adequate lighting in your home to reduce risk of falls?: Yes Life alert?: No Use of a cane, walker or w/c?: Yes Grab bars in the bathroom?: Yes Shower chair or bench in shower?: Yes Elevated toilet seat or a handicapped toilet?:  Yes  TIMED UP AND GO:  Was the test performed?  no  Cognitive Function: 6CIT completed    06/16/2017    3:54 PM  MMSE - Mini Mental State Exam  Orientation to time 5  Orientation to Place 5  Registration 3  Attention/ Calculation 5  Recall 3  Language- name 2 objects 2  Language- repeat 1  Language- follow 3 step command 3  Language- read & follow direction 1  Write a sentence 1  Copy design 1  Total score 30        02/17/2024    2:11 PM 03/28/2022   10:41 AM 03/27/2021    1:28 PM 03/26/2020    1:07 PM 12/01/2018    1:56 PM  6CIT Screen  What Year? 0 points 0 points 0 points 0 points 0 points  What month? 3 points 0 points 0 points 0 points 0 points  What time? 0 points 0 points 0 points 0 points 0 points  Count back from 20 0 points 0 points 0 points 0 points 2 points  Months in reverse 0 points 0 points 0 points 0 points 2 points  Repeat phrase 8 points 0 points 4 points 2 points 4 points  Total Score 11 points 0 points 4 points 2 points 8 points    Immunizations Immunization History  Administered Date(s) Administered   Fluad Quad(high Dose 65+) 01/27/2019, 01/05/2020, 01/09/2021, 01/29/2022   Fluad Trivalent(High Dose 65+) 01/09/2023   INFLUENZA, HIGH DOSE SEASONAL PF 01/25/2014, 01/24/2015, 01/01/2016, 02/26/2018   Influenza Split 01/20/2011, 12/22/2011   Influenza-Unspecified 01/19/2013   Pneumococcal Conjugate-13 03/01/2015   Pneumococcal Polysaccharide-23 04/21/2009   Tdap 08/30/2019   Zoster Recombinant(Shingrix) 09/03/2016, 06/18/2017, 12/15/2017   Zoster, Live 10/20/2010    Screening Tests Health Maintenance  Topic Date Due   COVID-19 Vaccine (1) Never done   OPHTHALMOLOGY EXAM  10/02/2022   FOOT EXAM  12/09/2023   Influenza Vaccine  07/19/2024 (Originally 11/20/2023)   HEMOGLOBIN A1C  07/26/2024   Diabetic kidney evaluation - Urine ACR  01/25/2025   Diabetic kidney evaluation - eGFR measurement  02/10/2025   Medicare Annual Wellness (AWV)   02/16/2025   DEXA SCAN  07/20/2025   DTaP/Tdap/Td (2 - Td or Tdap) 08/29/2029   Pneumococcal Vaccine:  50+ Years  Completed   Zoster Vaccines- Shingrix  Completed   Meningococcal B Vaccine  Aged Out   Hepatitis C Screening  Discontinued    Health Maintenance Items Addressed: See Nurse Notes at the end of this note  Additional Screening:  Vision Screening: Recommended annual ophthalmology exams for early detection of glaucoma and other disorders of the eye. Is the patient up to date with their annual eye exam?  Yes  Who is the provider or what is the name of the office in which the patient attends annual eye exams? [facility]  Dental Screening: Recommended annual dental exams for proper oral hygiene  Community Resource Referral / Chronic Care Management: CRR required this visit?  No   CCM required this visit?  No   Plan:    I have personally reviewed and noted the following in the patient's chart:   Medical and social history Use of alcohol , tobacco or illicit drugs  Current medications and supplements including opioid prescriptions. Patient is not currently taking opioid prescriptions. Functional ability and status Nutritional status Physical activity Advanced directives List of other physicians Hospitalizations, surgeries, and ER visits in previous 12 months Vitals Screenings to include cognitive, depression, and falls Referrals and appointments  In addition, I have reviewed and discussed with patient certain preventive protocols, quality metrics, and best practice recommendations. A written personalized care plan for preventive services as well as general preventive health recommendations were provided to patient.   Ozie Ned, CMA   02/17/2024   After Visit Summary: (MyChart) Due to this being a telephonic visit, the after visit summary with patients personalized plan was offered to patient via MyChart   Notes: PCP Follow Up Recommendations: pt is  aware and due the following: covid-pt decline, foot exam, diabetic eye exam--pt needs to make an apt--agreed

## 2024-02-17 NOTE — Patient Instructions (Signed)
 [name],  Thank you for taking the time for your Medicare Wellness Visit. I appreciate your continued commitment to your health goals. Please review the care plan we discussed, and feel free to reach out if I can assist you further.  Medicare recommends these wellness visits once per year to help you and your care team stay ahead of potential health issues. These visits are designed to focus on prevention, allowing your provider to concentrate on managing your acute and chronic conditions during your regular appointments.  Please note that Annual Wellness Visits do not include a physical exam. Some assessments may be limited, especially if the visit was conducted virtually. If needed, we may recommend a separate in-person follow-up with your provider.  Ongoing Care Seeing your primary care provider every 3 to 6 months helps us  monitor your health and provide consistent, personalized care.   Referrals If a referral was made during today's visit and you haven't received any updates within two weeks, please contact the referred provider directly to check on the status.  Recommended Screenings:  Health Maintenance  Topic Date Due   COVID-19 Vaccine (1) Never done   Eye exam for diabetics  10/02/2022   Medicare Annual Wellness Visit  03/29/2023   Complete foot exam   12/09/2023   Flu Shot  07/19/2024*   Hemoglobin A1C  07/26/2024   Yearly kidney health urinalysis for diabetes  01/25/2025   Yearly kidney function blood test for diabetes  02/10/2025   DEXA scan (bone density measurement)  07/20/2025   DTaP/Tdap/Td vaccine (2 - Td or Tdap) 08/29/2029   Pneumococcal Vaccine for age over 42  Completed   Zoster (Shingles) Vaccine  Completed   Meningitis B Vaccine  Aged Out   Hepatitis C Screening  Discontinued  *Topic was postponed. The date shown is not the original due date.       02/17/2024    2:06 PM  Advanced Directives  Does Patient Have a Medical Advance Directive? No  Would  patient like information on creating a medical advance directive? No - Patient declined   Advance Care Planning is important because it: Ensures you receive medical care that aligns with your values, goals, and preferences. Provides guidance to your family and loved ones, reducing the emotional burden of decision-making during critical moments.  Vision: Annual vision screenings are recommended for early detection of glaucoma, cataracts, and diabetic retinopathy. These exams can also reveal signs of chronic conditions such as diabetes and high blood pressure.  Dental: Annual dental screenings help detect early signs of oral cancer, gum disease, and other conditions linked to overall health, including heart disease and diabetes.  Please see the attached documents for additional preventive care recommendations.

## 2024-02-18 ENCOUNTER — Encounter: Payer: Self-pay | Admitting: Internal Medicine

## 2024-02-18 ENCOUNTER — Ambulatory Visit: Attending: Internal Medicine | Admitting: Internal Medicine

## 2024-02-18 VITALS — BP 110/58 | HR 77 | Ht 67.0 in | Wt 174.7 lb

## 2024-02-18 DIAGNOSIS — I4891 Unspecified atrial fibrillation: Secondary | ICD-10-CM

## 2024-02-18 DIAGNOSIS — I10 Essential (primary) hypertension: Secondary | ICD-10-CM

## 2024-02-18 LAB — CUP PACEART INCLINIC DEVICE CHECK
Date Time Interrogation Session: 20251030161949
Implantable Lead Connection Status: 753985
Implantable Lead Connection Status: 753985
Implantable Lead Implant Date: 20190128
Implantable Lead Implant Date: 20190128
Implantable Lead Location: 753860
Implantable Lead Location: 753860
Implantable Lead Model: 3830
Implantable Lead Model: 5076
Implantable Pulse Generator Implant Date: 20190128

## 2024-02-18 NOTE — Progress Notes (Signed)
 HPI Lorraine Leonard returns today for followup. She is a chronically ill 83 yo woman with a h/o chronic diastolic heart failure, uncontrolled atrial fib s/p AV node ablation and PPM insertion. She notes that she has had gradually improved. Her fatigue and dyspnea remain but have gotten better since we got her rate controlled. She is sedentary. She has lost 10 lbs in the interim. She notes that she feels ok. She is using her walker to get around. No falls.  Allergies    Allergies  Allergen Reactions   Macrodantin  [Nitrofurantoin  Macrocrystal] Other (See Comments)    Unknown    Mobic  [Meloxicam ] Swelling and Other (See Comments)    Feet and legs swell   Allopurinol  Swelling and Other (See Comments)    Feet and legs swell     Current Outpatient Medications  Medication Sig Dispense Refill   acetaminophen  (TYLENOL ) 325 MG tablet Take 2 tablets (650 mg total) by mouth every 6 (six) hours as needed for mild pain (pain score 1-3) or fever (or Fever >/= 101).     albuterol  (VENTOLIN  HFA) 108 (90 Base) MCG/ACT inhaler INHALE 2 PUFFS INTO THE LUNGS EVERY 6 HOURS AS NEEDED FOR WHEEZE OR SHORTNESS OF BREATH (Patient taking differently: Inhale 2 puffs into the lungs every 6 (six) hours as needed for wheezing or shortness of breath.) 8.5 each 2   apixaban  (ELIQUIS ) 5 MG TABS tablet Take 1 tablet (5 mg total) by mouth 2 (two) times daily. 180 tablet 0   atenolol  (TENORMIN ) 25 MG tablet Take 1 tablet (25 mg total) by mouth daily. 90 tablet 0   calcium  citrate (CALCITRATE - DOSED IN MG ELEMENTAL CALCIUM ) 950 MG tablet Take 1 tablet by mouth daily.     cephALEXin  (KEFLEX ) 250 MG capsule Take 250 mg by mouth daily.     cyanocobalamin  1000 MCG tablet Take 1,000 mcg by mouth daily. Vitamin b12 every AM     fluticasone  (FLONASE ) 50 MCG/ACT nasal spray PLACE 1 SPRAY INTO BOTH NOSTRILS 2 (TWO) TIMES DAILY AS NEEDED FOR ALLERGIES OR RHINITIS. 48 mL 1   furosemide  (LASIX ) 40 MG tablet Take 1 tablet (40 mg  total) by mouth every other day. 30 tablet 0   Iron , Ferrous Sulfate , 325 (65 Fe) MG TABS Take 325 mg by mouth daily. 90 tablet 1   levocetirizine (XYZAL ) 5 MG tablet TAKE 1 TABLET BY MOUTH EVERY DAY IN THE EVENING 90 tablet 1   levothyroxine  (SYNTHROID ) 100 MCG tablet TAKE 1 TABLET BY MOUTH EVERY DAY 90 tablet 0   liver oil-zinc oxide (DESITIN) 40 % ointment Apply 1 Application topically as needed for irritation. 56.7 g 0   Multiple Vitamins-Minerals (PRESERVISION AREDS 2) CAPS Take 1 capsule by mouth 2 (two) times daily.     nystatin (MYCOSTATIN/NYSTOP) powder Apply 1 Application topically 3 (three) times daily. 60 g 1   Olopatadine  HCl 0.2 % SOLN INSTILL 1 DROP INTO BOTH EYES EVERY DAY 10 mL 5   omeprazole  (PRILOSEC) 20 MG capsule Take 1 capsule (20 mg total) by mouth daily. 90 capsule 2   Polyethyl Glycol-Propyl Glycol (SYSTANE) 0.4-0.3 % GEL ophthalmic gel Place 1 application into both eyes at bedtime as needed (dry eyes).     vitamin C  (ASCORBIC ACID ) 500 MG tablet Take 1,000 mg by mouth daily. AM     Vitamin D , Ergocalciferol , (DRISDOL ) 1.25 MG (50000 UNIT) CAPS capsule TAKE 1 CAPSULE (50,000 UNITS TOTAL) BY MOUTH EVERY 7 (SEVEN) DAYS 12 capsule  3   predniSONE  (DELTASONE ) 5 MG tablet TAKE 1 TABLET (5 MG TOTAL) BY MOUTH 2 (TWO) TIMES DAILY WITH A MEAL. (Patient not taking: Reported on 02/18/2024) 180 tablet 1   No current facility-administered medications for this visit.     Past Medical History:  Diagnosis Date   Allergic rhinitis    Anxiety    Anxiety    Arthritis    ~ all my joints (05/18/2017)   Atrial fibrillation (HCC) 09/2016   Atrial fibrillation with RVR (HCC) 05/18/2017   Chronic heart failure with preserved ejection fraction (HFpEF) (HCC)    Chronic kidney disease, stage 3a (HCC)    Chronic lower back pain    COPD (chronic obstructive pulmonary disease) (HCC)    Depression    Diverticulosis    GERD (gastroesophageal reflux disease)    Glaucoma    Gout    History  of blood transfusion 1983   when I gave a 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   Hyperlipidemia    Hypertension    Hypothyroidism    Macular degeneration of both eyes    Neuropathy    On home oxygen  therapy    3L; 24/7 (05/18/2017)   Pre-diabetes    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   Pulmonary fibrosis (HCC)    Rheumatoid arthritis (HCC)    Sleep apnea    uses 3 liters of o2 24/7   Urticaria     ROS:   All systems reviewed and negative except as noted in the HPI.   Past Surgical History:  Procedure Laterality Date   AV NODE ABLATION  05/18/2017   AV NODE ABLATION N/A 05/18/2017   Procedure: AV NODE ABLATION;  Surgeon: Waddell Danelle ORN, MD;  Location: [facility];  Service: Cardiovascular;  Laterality: N/A;   BACK SURGERY     BREAST LUMPECTOMY Left    CARDIOVERSION N/A 01/30/2017   Procedure: CARDIOVERSION;  Surgeon: Delford Maude BROCKS, MD;  Location: [facility];  Service: Cardiovascular;  Laterality: N/A;   CARDIOVERSION N/A 04/09/2017   Procedure: CARDIOVERSION;  Surgeon: Debera Jayson MATSU, MD;  Location: [facility];  Service: Cardiovascular;  Laterality: N/A;   CATARACT EXTRACTION W/ INTRAOCULAR LENS  IMPLANT, BILATERAL Bilateral    DILATION AND CURETTAGE OF UTERUS     EUS N/A 10/03/2015   Procedure: UPPER ENDOSCOPIC ULTRASOUND (EUS) RADIAL;  Surgeon: Elsie Cree, MD;  Location: [facility];  Service: Endoscopy;  Laterality: N/A;   FINE NEEDLE ASPIRATION N/A 10/03/2015   Procedure: FINE NEEDLE ASPIRATION (FNA) RADIAL;  Surgeon: Elsie Cree, MD;  Location: [facility];  Service: Endoscopy;  Laterality: N/A;   INSERT / REPLACE / REMOVE PACEMAKER  05/18/2017   LUMBAR LAMINECTOMY  1995; 06/23/2008   L4-5/notes 08/20/2010   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   NEPHRECTOMY TRANSPLANTED ORGAN     PACEMAKER IMPLANT N/A 05/18/2017   Procedure: PACEMAKER IMPLANT;  Surgeon: Waddell Danelle ORN, MD;  Location: [facility];  Service:  Cardiovascular;  Laterality: N/A;   TOTAL ABDOMINAL HYSTERECTOMY     TUBAL LIGATION       Family History  Problem Relation Age of Onset   Diabetes Other    Hypertension Other    Coronary artery disease Other    Stroke Other    Asthma Other    Kidney disease Sister    Cancer Sister        lung   Lung cancer Sister  Cancer Sister        lung   Lung cancer Sister    Cancer Sister        lung   Suicidality Son        committed suicide in 2009   CVA Son    Hyperlipidemia Other    Anxiety disorder Other    Migraines Other    Heart failure Father    Healthy Brother    Cancer Sister        lung   Healthy Sister      Social History   Socioeconomic History   Marital status: Widowed    Spouse name: Tanveer Dobberstein   Number of children: 4   Years of education: 9   Highest education level: 9th grade  Occupational History   Occupation: retired    Comment: cna  Tobacco Use   Smoking status: Never    Passive exposure: Never   Smokeless tobacco: Never  Vaping Use   Vaping status: Never Used  Substance and Sexual Activity   Alcohol  use: No   Drug use: No   Sexual activity: Not Currently    Partners: Male    Birth control/protection: Surgical  Other Topics Concern   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   Pt lives alone in single wide trailer. They have 4 children and 13 grandchildren.    Social Drivers of Corporate Investment Banker Strain: Low Risk  (02/17/2024)   Overall Financial Resource Strain (CARDIA)    Difficulty of Paying Living Expenses: Not very hard  Food Insecurity: No Food Insecurity (02/17/2024)   Hunger Vital Sign    Worried About Running Out of Food in the Last Year: Never true    Ran Out of Food in the Last Year: Never true  Recent Concern: Food Insecurity - Food Insecurity Present (12/29/2023)   Hunger Vital Sign    Worried About Running Out of Food in the Last Year:  Sometimes true    Ran Out of Food in the Last Year: Never true  Transportation Needs: Unmet Transportation Needs (02/17/2024)   PRAPARE - Administrator, Civil Service (Medical): Yes    Lack of Transportation (Non-Medical): No  Physical Activity: Inactive (02/17/2024)   Exercise Vital Sign    Days of Exercise per Week: 0 days    Minutes of Exercise per Session: 0 min  Stress: No Stress Concern Present (02/17/2024)   [employer] of Occupational Health - Occupational Stress Questionnaire    Feeling of Stress: Not at all  Recent Concern: Stress - Stress Concern Present (12/29/2023)   [employer] of Occupational Health - Occupational Stress Questionnaire    Feeling of Stress: To some extent  Social Connections: Socially Isolated (02/17/2024)   Social Connection and Isolation Panel    Frequency of Communication with Friends and Family: More than three times a week    Frequency of Social Gatherings with Friends and Family: More than three times a week    Attends Religious Services: Never    Database Administrator or Organizations: No    Attends Banker Meetings: Never    Marital Status: Widowed  Intimate Partner Violence: Not At Risk (02/17/2024)   Humiliation, Afraid, Rape, and Kick questionnaire    Fear of Current or Ex-Partner: No    Emotionally Abused: No    Physically Abused: No  Sexually Abused: No     BP (!) 110/58   Pulse 77   Ht 5' 7 (1.702 m)   Wt 174 lb 11.2 oz (79.2 kg)   SpO2 96%   BMI 27.36 kg/m   Physical Exam:  Well appearing NAD HEENT: Unremarkable Neck:  No JVD, no thyromegally Lymphatics:  No adenopathy Back:  No CVA tenderness Lungs:  Clear HEART:  Regular rate rhythm, no murmurs, no rubs, no clicks Abd:  soft, positive bowel sounds, no organomegally, no rebound, no guarding Ext:  2 plus pulses, no edema, no cyanosis, no clubbing Skin:  No rashes no nodules Neuro:  CN II through XII intact, motor grossly  intact  EKG - afib with ventricular pacing  DEVICE  Normal device function.  See PaceArt for details.   Assess/Plan:  Atrial fib - her VR is well controlled. She has no escape rhythm today but denies palpitations. Obesity - her weight is better and she is now under 180 lbs.  HTN - her bp is up a bit today. We will follow. 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 The His bundle lead in the atrial port has a chronically elevated threshold. But is stable. We will follow.   Danelle Rojean Ige,MD

## 2024-02-18 NOTE — Patient Instructions (Signed)
 Medication Instructions:  Your physician recommends that you continue on your current medications as directed. Please refer to the Current Medication list given to you today.  *If you need a refill on your cardiac medications before your next appointment, please call your pharmacy*  Lab Work: None ordered.  You may go to any [facility] Location for your lab work:  [facility] - [address] ([facility]) - [address] [phone] [address]  [place] - [address]   [place]  - [address]   [place] - [address] - [address] [place]  - [address] [place] - [address] ([facility]   If you have labs (blood work) drawn today and your tests are completely normal, you will receive your results only by: [employer] (if you have MyChart)  If you have any lab test that is abnormal or we need to change your treatment, we will call you or send a MyChart message to review the results.  Testing/Procedures: None ordered.  Follow-Up: At [facility], you and your health needs are our priority.  As part of our continuing mission to provide you with exceptional heart care, we have created designated Provider Care Teams.  These Care Teams include your primary Cardiologist (physician) and Advanced Practice Providers (APPs -  Physician Assistants and Nurse Practitioners) who all work together to provide you with the care you need, when you need it.  We recommend signing up for the patient portal called MyChart.  Sign up information is provided on this After Visit Summary.  MyChart is used to connect with patients for Virtual Visits (Telemedicine).  Patients are able to view lab/test results, encounter notes, upcoming appointments, etc.  Non-urgent messages can be sent to your provider as well.   To learn more about what you can do with MyChart, go to  [email or web address].    Your next appointment:   Late January 2026  The format for your next appointment:   In Person  Provider:   Advanced Practice Providers on your designated Care Team:   Charlies Arthur, PA-C Michael Andy Tillery, PA-C Brandy Ollis, NP  Note: Remote monitoring is used to monitor your Pacemaker/ ICD from home. This monitoring reduces the number of office visits required to check your device to one time per year. It allows us  to keep an eye on the functioning of your device to ensure it is working properly.

## 2024-02-22 ENCOUNTER — Encounter

## 2024-02-22 LAB — CUP PACEART REMOTE DEVICE CHECK
Battery Remaining Longevity: 3 mo
Battery Voltage: 2.63 V
Brady Statistic AP VP Percent: 18.15 %
Brady Statistic AP VS Percent: 81.83 %
Brady Statistic AS VP Percent: 0 %
Brady Statistic AS VS Percent: 0.01 %
Brady Statistic RA Percent Paced: 99.99 %
Brady Statistic RV Percent Paced: 18.15 %
Date Time Interrogation Session: 20251103013131
Implantable Lead Connection Status: 753985
Implantable Lead Connection Status: 753985
Implantable Lead Implant Date: 20190128
Implantable Lead Implant Date: 20190128
Implantable Lead Location: 753860
Implantable Lead Location: 753860
Implantable Lead Model: 3830
Implantable Lead Model: 5076
Implantable Pulse Generator Implant Date: 20190128
Lead Channel Impedance Value: 304 Ohm
Lead Channel Impedance Value: 361 Ohm
Lead Channel Impedance Value: 399 Ohm
Lead Channel Impedance Value: 437 Ohm
Lead Channel Sensing Intrinsic Amplitude: 11.375 mV
Lead Channel Sensing Intrinsic Amplitude: 11.375 mV
Lead Channel Sensing Intrinsic Amplitude: 4.25 mV
Lead Channel Sensing Intrinsic Amplitude: 4.25 mV
Lead Channel Setting Pacing Amplitude: 2.5 V
Lead Channel Setting Pacing Amplitude: 2.5 V
Lead Channel Setting Pacing Pulse Width: 0.4 ms
Lead Channel Setting Sensing Sensitivity: 0.9 mV
Zone Setting Status: 755011
Zone Setting Status: 755011

## 2024-02-25 ENCOUNTER — Ambulatory Visit: Payer: Self-pay | Admitting: Internal Medicine

## 2024-03-02 ENCOUNTER — Other Ambulatory Visit: Payer: Self-pay | Admitting: *Deleted

## 2024-03-02 DIAGNOSIS — K219 Gastro-esophageal reflux disease without esophagitis: Secondary | ICD-10-CM

## 2024-03-05 ENCOUNTER — Other Ambulatory Visit: Payer: Self-pay | Admitting: Family

## 2024-03-09 ENCOUNTER — Other Ambulatory Visit: Payer: Self-pay | Admitting: Family

## 2024-03-09 DIAGNOSIS — R0982 Postnasal drip: Secondary | ICD-10-CM

## 2024-03-09 DIAGNOSIS — J019 Acute sinusitis, unspecified: Secondary | ICD-10-CM

## 2024-03-10 ENCOUNTER — Ambulatory Visit: Payer: Self-pay

## 2024-03-10 NOTE — Telephone Encounter (Signed)
Pt needs to go to ED.

## 2024-03-10 NOTE — Telephone Encounter (Unsigned)
 Copied from CRM [id number]. Topic: Referral - Question >> Mar 10, 2024  4:05 PM Delon DASEN wrote: Reason for CRM: daughter Al refusing to take patient to ED, wants a referral to test for dimentia-  states ED has told her in the past they can't help- [phone]
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
## 2024-03-11 NOTE — Telephone Encounter (Signed)
 Called back and can not leave a VM.

## 2024-03-11 NOTE — Telephone Encounter (Signed)
 Ok, I am checking on her referral that was placed on 02/11/24 now. Please schedule her an in office visit also to rule out any UTI or acute causes of memory changes.

## 2024-03-14 ENCOUNTER — Telehealth: Payer: Self-pay

## 2024-03-14 DIAGNOSIS — I4891 Unspecified atrial fibrillation: Secondary | ICD-10-CM

## 2024-03-14 NOTE — Telephone Encounter (Signed)
 Alert received:  RRT, battery voltage low, detected 03/14/24

## 2024-03-14 NOTE — Telephone Encounter (Addendum)
 Alert received:   RRT, battery voltage low, detected 03/14/24.  Discussed with Dr. Waddell.  Ok to go ahead and schedule gen change.  Additional office visit not needed.  Spoke with daughter Al Solian.  Advised of device reaching ERI.  Advised she would receive a phone call to schedule procedure.

## 2024-03-15 ENCOUNTER — Telehealth: Payer: Self-pay | Admitting: Family Medicine

## 2024-03-15 DIAGNOSIS — J8410 Pulmonary fibrosis, unspecified: Secondary | ICD-10-CM

## 2024-03-15 DIAGNOSIS — I517 Cardiomegaly: Secondary | ICD-10-CM

## 2024-03-15 NOTE — Telephone Encounter (Signed)
 Left message for patient's daughter to call back.

## 2024-03-15 NOTE — Telephone Encounter (Unsigned)
 Copied from CRM #[id number]. Topic: General - Other >> Mar 15, 2024  4:20 PM Avram MATSU wrote: Reason for CRM: Zelma is calling from [place] requesting orders hospices evaluation   Please advise: [phone]

## 2024-03-16 NOTE — Telephone Encounter (Signed)
 Please sign pending referral to Hospice per request from [facility]. I put their agency in the comments, if appropriate.

## 2024-03-16 NOTE — Telephone Encounter (Signed)
 Left message for daughter to call back.

## 2024-03-16 NOTE — Telephone Encounter (Signed)
Pt daughter called back and would like a call back

## 2024-03-16 NOTE — Telephone Encounter (Signed)
 Spoke with the patient's daughter and scheduled her for a pacemaker generator change on 12/30.

## 2024-03-16 NOTE — Telephone Encounter (Signed)
 Pts daughter returning call. Please advise.

## 2024-03-21 NOTE — Telephone Encounter (Signed)
 TC back to Angela w/ [facility] that the referral has been placed and signed.

## 2024-03-22 ENCOUNTER — Telehealth: Payer: Self-pay | Admitting: Internal Medicine

## 2024-03-22 NOTE — Telephone Encounter (Signed)
 Pt's granddaughter would like a c/b regarding procedure on 12/30. Granddaughter states that there is a conflict on that date and needs to reschedule. Please advise

## 2024-03-22 NOTE — Telephone Encounter (Signed)
 Spoke with pt's DPR and advised Dr Waddell will be retiring the first week of January and offered 04/22/2024 if pt would like for Dr Waddell to be the provider to perform the generator change out.  Pt's DPR declines that date and states she will make arrangements and plan to keep 04/19/24 date for generator change and thanked RN for the callback.

## 2024-03-24 ENCOUNTER — Ambulatory Visit: Attending: Internal Medicine

## 2024-03-24 ENCOUNTER — Encounter

## 2024-03-24 DIAGNOSIS — I442 Atrioventricular block, complete: Secondary | ICD-10-CM

## 2024-03-25 ENCOUNTER — Telehealth: Payer: Self-pay

## 2024-03-25 NOTE — Telephone Encounter (Signed)
 Open in error

## 2024-03-26 LAB — CUP PACEART REMOTE DEVICE CHECK
Battery Remaining Longevity: 3 mo
Battery Voltage: 2.62 V
Brady Statistic AP VP Percent: 32.93 %
Brady Statistic AP VS Percent: 67.05 %
Brady Statistic AS VP Percent: 0 %
Brady Statistic AS VS Percent: 0.01 %
Brady Statistic RA Percent Paced: 100 %
Brady Statistic RV Percent Paced: 32.93 %
Date Time Interrogation Session: 20251204071416
Implantable Lead Connection Status: 753985
Implantable Lead Connection Status: 753985
Implantable Lead Implant Date: 20190128
Implantable Lead Implant Date: 20190128
Implantable Lead Location: 753860
Implantable Lead Location: 753860
Implantable Lead Model: 3830
Implantable Lead Model: 5076
Implantable Pulse Generator Implant Date: 20190128
Lead Channel Impedance Value: 323 Ohm
Lead Channel Impedance Value: 361 Ohm
Lead Channel Impedance Value: 437 Ohm
Lead Channel Impedance Value: 456 Ohm
Lead Channel Sensing Intrinsic Amplitude: 10.75 mV
Lead Channel Sensing Intrinsic Amplitude: 10.75 mV
Lead Channel Sensing Intrinsic Amplitude: 4.25 mV
Lead Channel Sensing Intrinsic Amplitude: 4.25 mV
Lead Channel Setting Pacing Amplitude: 2.5 V
Lead Channel Setting Pacing Amplitude: 2.5 V
Lead Channel Setting Pacing Pulse Width: 0.4 ms
Lead Channel Setting Sensing Sensitivity: 0.9 mV
Zone Setting Status: 755011
Zone Setting Status: 755011

## 2024-03-27 ENCOUNTER — Ambulatory Visit: Payer: Self-pay | Admitting: Internal Medicine

## 2024-03-29 ENCOUNTER — Emergency Department (HOSPITAL_COMMUNITY)

## 2024-03-29 ENCOUNTER — Other Ambulatory Visit: Payer: Self-pay

## 2024-03-29 ENCOUNTER — Inpatient Hospital Stay (HOSPITAL_COMMUNITY)
Admission: EM | Admit: 2024-03-29 | Discharge: 2024-04-02 | DRG: 689 | Disposition: A | Discharge status: Skilled Nursing Facility | Attending: Internal Medicine | Admitting: Internal Medicine

## 2024-03-29 ENCOUNTER — Encounter (HOSPITAL_COMMUNITY): Payer: Self-pay | Admitting: Emergency Medicine

## 2024-03-29 DIAGNOSIS — N390 Urinary tract infection, site not specified: Principal | ICD-10-CM | POA: Diagnosis present

## 2024-03-29 LAB — CBC
HCT: 38.2 % (ref 36.0–46.0)
Hemoglobin: 12.5 g/dL (ref 12.0–15.0)
MCH: 30.8 pg (ref 26.0–34.0)
MCHC: 32.7 g/dL (ref 30.0–36.0)
MCV: 94.1 fL (ref 80.0–100.0)
Platelets: 234 K/uL (ref 150–400)
RBC: 4.06 MIL/uL (ref 3.87–5.11)
RDW: 15.2 % (ref 11.5–15.5)
WBC: 7.1 K/uL (ref 4.0–10.5)
nRBC: 0 % (ref 0.0–0.2)

## 2024-03-29 LAB — HEPATIC FUNCTION PANEL
ALT: 14 U/L (ref 0–44)
AST: 46 U/L — ABNORMAL HIGH (ref 15–41)
Albumin: 3.6 g/dL (ref 3.5–5.0)
Alkaline Phosphatase: 79 U/L (ref 38–126)
Bilirubin, Direct: 0.3 mg/dL — ABNORMAL HIGH (ref 0.0–0.2)
Indirect Bilirubin: 0.4 mg/dL (ref 0.3–0.9)
Total Bilirubin: 0.7 mg/dL (ref 0.0–1.2)
Total Protein: 6.8 g/dL (ref 6.5–8.1)

## 2024-03-29 LAB — URINALYSIS, W/ REFLEX TO CULTURE (INFECTION SUSPECTED)
Bacteria, UA: NONE SEEN
Bilirubin Urine: NEGATIVE
Glucose, UA: NEGATIVE mg/dL
Ketones, ur: NEGATIVE mg/dL
Nitrite: NEGATIVE
Protein, ur: 100 mg/dL — AB
RBC / HPF: >50 RBC/hpf (ref 0–5)
Specific Gravity, Urine: 1.015 (ref 1.005–1.030)
WBC, UA: >50 WBC/hpf (ref 0–5)
pH: 5 (ref 5.0–8.0)

## 2024-03-29 LAB — RESP PANEL BY RT-PCR (RSV, FLU A&B, COVID)  RVPGX2
Influenza A by PCR: NEGATIVE
Influenza B by PCR: NEGATIVE
Resp Syncytial Virus by PCR: NEGATIVE
SARS Coronavirus 2 by RT PCR: NEGATIVE

## 2024-03-29 LAB — PROTIME-INR
INR: 2.4 — ABNORMAL HIGH (ref 0.8–1.2)
Prothrombin Time: 27.6 s — ABNORMAL HIGH (ref 11.4–15.2)

## 2024-03-29 LAB — TYPE AND SCREEN
ABO/RH(D): B POS
Antibody Screen: NEGATIVE

## 2024-03-29 LAB — BASIC METABOLIC PANEL WITH GFR
Anion gap: 13 (ref 5–15)
BUN: 26 mg/dL — ABNORMAL HIGH (ref 8–23)
CO2: 20 mmol/L — ABNORMAL LOW (ref 22–32)
Calcium: 9.9 mg/dL (ref 8.9–10.3)
Chloride: 105 mmol/L (ref 98–111)
Creatinine, Ser: 1.91 mg/dL — ABNORMAL HIGH (ref 0.44–1.00)
GFR, Estimated: 26 mL/min — ABNORMAL LOW (ref >60)
Glucose, Bld: 148 mg/dL — ABNORMAL HIGH (ref 70–99)
Potassium: 3.7 mmol/L (ref 3.5–5.1)
Sodium: 138 mmol/L (ref 135–145)

## 2024-03-29 LAB — LACTIC ACID, PLASMA: Lactic Acid, Venous: 2.2 mmol/L (ref 0.5–1.9)

## 2024-03-29 MED ORDER — SODIUM CHLORIDE 0.9 % IV BOLUS
1000.0000 mL | Freq: Once | INTRAVENOUS | Status: AC
  Administered 2024-03-29: 1000 mL via INTRAVENOUS

## 2024-03-29 MED ORDER — SODIUM CHLORIDE 0.9 % IV BOLUS
2000.0000 mL | Freq: Once | INTRAVENOUS | Status: AC
  Administered 2024-03-29: 2000 mL via INTRAVENOUS

## 2024-03-29 MED ORDER — SODIUM CHLORIDE 0.9 % IV SOLN
2.0000 g | Freq: Once | INTRAVENOUS | Status: AC
  Administered 2024-03-29: 2 g via INTRAVENOUS
  Filled 2024-03-29: qty 20

## 2024-03-29 NOTE — ED Notes (Addendum)
 Family updated as to patient's status. Daughter Al [phone]

## 2024-03-29 NOTE — ED Notes (Signed)
 Pt's brief saturated with foul smelling urine. Pt cleaned, placed in new brief, tolerated well. Pt in position of comfort with call light in reach, stretcher locked in lowest position.

## 2024-03-29 NOTE — ED Triage Notes (Signed)
 Pt fell earlier today and now c/o bilateral leg pain and weakness. Pt has dementia.

## 2024-03-29 NOTE — Consult Note (Signed)
 CODE SEPSIS - PHARMACY COMMUNICATION  **Broad Spectrum Antibiotics should be administered within 1 hour of Sepsis diagnosis**  Time Code Sepsis Called/Page Received: 2116  Antibiotics Ordered: 2141  Time of 1st antibiotic administration: ceftriaxone   Additional action taken by pharmacy: none  If necessary, Name of Provider/Nurse Contacted: n/a   Baley Lorimer Rodriguez-Guzman PharmD, BCPS 03/29/2024 9:56 PM

## 2024-03-29 NOTE — Sepsis Progress Note (Addendum)
 Elink monitoring for the code sepsis protocol.  Notified provider of need to order antibiotics.

## 2024-03-29 NOTE — H&P (Incomplete)
 History and Physical  Lorraine Leonard FMW:[id number] DOB: [birth year] DOA: 03/29/2024  Referring physician: ***  PCP: Lavell Bari LABOR, FNP  Outpatient Specialists: *** Patient coming from: *** & is able to ambulate ***  Chief Complaint: ***   HPI: Lorraine Leonard is a 83 y.o. female with medical history significant for *** (For level 3, the HPI must include 4+ descriptors: Location, Quality, Severity, Duration, Timing, Context, modifying factors, associated signs/symptoms and/or status of 3+ chronic problems.)   ED Course: ***  Review of Systems: Review of systems as noted in the HPI. All other systems reviewed and are negative.   Past Medical History:  Diagnosis Date   Allergic rhinitis    Anxiety    Anxiety    Arthritis    ~ all my joints (05/18/2017)   Atrial fibrillation (HCC) 09/2016   Atrial fibrillation with RVR (HCC) 05/18/2017   Chronic heart failure with preserved ejection fraction (HFpEF) (HCC)    Chronic kidney disease, stage 3a (HCC)    Chronic lower back pain    COPD (chronic obstructive pulmonary disease) (HCC)    Depression    Diverticulosis    GERD (gastroesophageal reflux disease)    Glaucoma    Gout    History of blood transfusion 1983   when I gave a 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   Hyperlipidemia    Hypertension    Hypothyroidism    Macular degeneration of both eyes    Neuropathy    On home oxygen  therapy    3L; 24/7 (05/18/2017)   Pre-diabetes    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   Pulmonary fibrosis (HCC)    Rheumatoid arthritis (HCC)    Sleep apnea    uses 3 liters of o2 24/7   Urticaria    Past Surgical History:  Procedure Laterality Date   AV NODE ABLATION  05/18/2017   AV NODE ABLATION N/A 05/18/2017   Procedure: AV NODE ABLATION;  Surgeon: Waddell Danelle ORN, MD;  Location: [facility];  Service: Cardiovascular;  Laterality: N/A;   BACK SURGERY     BREAST LUMPECTOMY Left    CARDIOVERSION N/A 01/30/2017   Procedure:  CARDIOVERSION;  Surgeon: Delford Maude BROCKS, MD;  Location: [facility];  Service: Cardiovascular;  Laterality: N/A;   CARDIOVERSION N/A 04/09/2017   Procedure: CARDIOVERSION;  Surgeon: Debera Jayson MATSU, MD;  Location: [facility];  Service: Cardiovascular;  Laterality: N/A;   CATARACT EXTRACTION W/ INTRAOCULAR LENS  IMPLANT, BILATERAL Bilateral    DILATION AND CURETTAGE OF UTERUS     EUS N/A 10/03/2015   Procedure: UPPER ENDOSCOPIC ULTRASOUND (EUS) RADIAL;  Surgeon: Elsie Cree, MD;  Location: [facility];  Service: Endoscopy;  Laterality: N/A;   FINE NEEDLE ASPIRATION N/A 10/03/2015   Procedure: FINE NEEDLE ASPIRATION (FNA) RADIAL;  Surgeon: Elsie Cree, MD;  Location: [facility];  Service: Endoscopy;  Laterality: N/A;   INSERT / REPLACE / REMOVE PACEMAKER  05/18/2017   LUMBAR LAMINECTOMY  1995; 06/23/2008   L4-5/notes 08/20/2010   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 She has never been exposed to tobacco  smoke. She has never used smokeless tobacco. She reports that she does not drink alcohol  and does not use drugs.   Allergies  Allergen Reactions   Macrodantin  [Nitrofurantoin  Macrocrystal] Other (See Comments)    Unknown    Mobic  [Meloxicam ] Swelling and Other (See Comments)    Feet and legs swell   Allopurinol  Swelling and Other (See Comments)    Feet and legs swell    Family History  Problem Relation Age of Onset   Diabetes Other    Hypertension Other    Coronary artery disease Other    Stroke Other    Asthma Other    Kidney disease Sister    Cancer Sister        lung   Lung cancer Sister    Cancer Sister        lung   Lung  cancer Sister    Cancer Sister        lung   Suicidality Son        committed suicide in 2009   CVA Son    Hyperlipidemia Other    Anxiety disorder Other    Migraines Other    Heart failure Father    Healthy Brother    Cancer Sister        lung   Healthy Sister     ***  Prior to Admission medications   Medication Sig Start Date End Date Taking? Authorizing Provider  acetaminophen  (TYLENOL ) 325 MG tablet Take 2 tablets (650 mg total) by mouth every 6 (six) hours as needed for mild pain (pain score 1-3) or fever (or Fever >/= 101). 11/26/23   Emokpae, Courage, MD  albuterol  (VENTOLIN  HFA) 108 (90 Base) MCG/ACT inhaler INHALE 2 PUFFS INTO THE LUNGS EVERY 6 HOURS AS NEEDED FOR WHEEZE OR SHORTNESS OF BREATH Patient taking differently: Inhale 2 puffs into the lungs every 6 (six) hours as needed for wheezing or shortness of breath. 11/26/23   Pearlean Manus, MD  apixaban  (ELIQUIS ) 5 MG TABS tablet Take 1 tablet (5 mg total) by mouth 2 (two) times daily. 01/26/24   Lavell Lye A, FNP  atenolol  (TENORMIN ) 25 MG tablet Take 1 tablet (25 mg total) by mouth daily. 01/26/24   Lavell Lye LABOR, FNP  calcium  citrate (CALCITRATE - DOSED IN MG ELEMENTAL CALCIUM ) 950 MG tablet Take 1 tablet by mouth daily.    [provider]  cephALEXin  (KEFLEX ) 250 MG capsule Take 250 mg by mouth daily. 01/24/24   [provider]  cyanocobalamin  1000 MCG tablet Take 1,000 mcg by mouth daily. Vitamin b12 every AM    [provider]  fluticasone  (FLONASE ) 50 MCG/ACT nasal spray PLACE 1 SPRAY INTO BOTH NOSTRILS 2 (TWO) TIMES DAILY AS NEEDED FOR ALLERGIES OR RHINITIS. 03/09/24   Lavell Lye A, FNP  furosemide  (LASIX ) 40 MG tablet Take 1 tablet (40 mg total) by mouth every other day. 02/04/24   Yolande Lamar BROCKS, MD  Iron , Ferrous Sulfate , 325 (65 Fe) MG TABS Take 325 mg by mouth daily. 01/26/24   Lavell Lye A, FNP  levocetirizine (XYZAL ) 5 MG tablet TAKE 1 TABLET BY MOUTH EVERY DAY IN THE  EVENING 02/10/24   Lavell Lye A, FNP  levothyroxine  (SYNTHROID ) 100 MCG tablet TAKE 1 TABLET BY MOUTH EVERY DAY 02/02/24   Lavell Lye A, FNP  liver oil-zinc  oxide (DESITIN) 40 % ointment Apply 1 Application topically as needed for irritation. 02/04/24   Yolande Lamar BROCKS, MD  Multiple Vitamins-Minerals (PRESERVISION AREDS 2) CAPS  Take 1 capsule by mouth 2 (two) times daily.    [provider]  nystatin  (MYCOSTATIN /NYSTOP ) powder Apply 1 Application topically 3 (three) times daily. 01/26/24   Lavell Lye A, FNP  Olopatadine  HCl 0.2 % SOLN INSTILL 1 DROP INTO BOTH EYES EVERY DAY 04/30/20   Lavell Lye A, FNP  omeprazole  (PRILOSEC) 20 MG capsule TAKE 1 CAPSULE BY MOUTH EVERY DAY 03/02/24   Hawks, Lye A, FNP  Polyethyl Glycol-Propyl Glycol (SYSTANE) 0.4-0.3 % GEL ophthalmic gel Place 1 application into both eyes at bedtime as needed (dry eyes).    [provider]  predniSONE  (DELTASONE ) 5 MG tablet TAKE 1 TABLET (5 MG TOTAL) BY MOUTH 2 (TWO) TIMES DAILY WITH A MEAL. Patient not taking: Reported on 02/18/2024 06/18/23   Lavell Lye A, FNP  vitamin C  (ASCORBIC ACID ) 500 MG tablet Take 1,000 mg by mouth daily. AM    [provider]  Vitamin D , Ergocalciferol , (DRISDOL ) 1.25 MG (50000 UNIT) CAPS capsule TAKE 1 CAPSULE (50,000 UNITS TOTAL) BY MOUTH EVERY 7 (SEVEN) DAYS 09/26/22   Lavell Lye LABOR, FNP    Physical Exam: BP (!) 96/36   Pulse 61   Temp 98 F (36.7 C)   Resp 18   Ht 5' 7 (1.702 m)   Wt 80 kg   SpO2 96%   BMI 27.62 kg/m   General: 83 y.o. year-old female well developed well nourished in no acute distress.  Alert and oriented x3. Cardiovascular: Regular rate and rhythm with no rubs or gallops.  No thyromegaly or JVD noted.  No lower extremity edema. 2/4 pulses in all 4 extremities. Respiratory: Clear to auscultation with no wheezes or rales. Good inspiratory effort. Abdomen: Soft nontender nondistended with normal bowel sounds x4  quadrants. Muskuloskeletal: No cyanosis, clubbing or edema noted bilaterally Neuro: CN II-XII intact, strength, sensation, reflexes Skin: No ulcerative lesions noted or rashes Psychiatry: Judgement and insight appear normal. Mood is appropriate for condition and setting          Labs on Admission:  Basic Metabolic Panel: Recent Labs  Lab 03/29/24 2031  NA 138  K 3.7  CL 105  CO2 20*  GLUCOSE 148*  BUN 26*  CREATININE 1.91*  CALCIUM  9.9   Liver Function Tests: Recent Labs  Lab 03/29/24 2031  AST 46*  ALT 14  ALKPHOS 79  BILITOT 0.7  PROT 6.8  ALBUMIN 3.6   No results for input(s): LIPASE, AMYLASE in the last 168 hours. No results for input(s): AMMONIA in the last 168 hours. CBC: Recent Labs  Lab 03/29/24 2105  WBC 7.1  HGB 12.5  HCT 38.2  MCV 94.1  PLT 234   Cardiac Enzymes: No results for input(s): CKTOTAL, CKMB, CKMBINDEX, TROPONINI in the last 168 hours.  BNP (last 3 results) Recent Labs    09/10/23 1609  BNP 441.3*    ProBNP (last 3 results) No results for input(s): PROBNP in the last 8760 hours.  CBG: No results for input(s): GLUCAP in the last 168 hours.  Radiological Exams on Admission: DG Hip Unilat W or Wo Pelvis 2-3 Views Left Result Date: 03/29/2024 CLINICAL DATA:  Fall with left-sided hip pain EXAM: DG HIP (WITH OR WITHOUT PELVIS) 2-3V LEFT COMPARISON:  CT 03/29/2024 FINDINGS: Hardware in the lumbo sacral spine. No definitive fracture or malalignment. IMPRESSION: No definite acute osseous abnormality. Electronically Signed   By: Luke Bun M.D.   On: 03/29/2024 22:42   DG Chest Port 1 View Result Date: 03/29/2024 CLINICAL  DATA:  Possible sepsis EXAM: PORTABLE CHEST 1 VIEW COMPARISON:  Fall 05/27/2022, CT 03/29/2024, 07/19/2018 FINDINGS: Left-sided pacing device as before. Enlarged cardiomediastinal silhouette with aortic atherosclerosis. Prominent mediastinum, likely due to portable technique and patient rotation. Mild  reticular and interstitial opacities, corresponding to scarring on CT. No acute confluent airspace disease. No pleural effusion or pneumothorax. IMPRESSION: No active disease. Cardiomegaly with chronic changes. Electronically Signed   By: Luke Bun M.D.   On: 03/29/2024 22:40   CT CHEST ABDOMEN PELVIS WO CONTRAST Result Date: 03/29/2024 EXAM: CT CHEST, ABDOMEN AND PELVIS WITHOUT CONTRAST 03/29/2024 10:18:20 PM TECHNIQUE: CT of the chest, abdomen and pelvis was performed without the administration of intravenous contrast. Multiplanar reformatted images are provided for review. Automated exposure control, iterative reconstruction, and/or weight based adjustment of the mA/kV was utilized to reduce the radiation dose to as low as reasonably achievable. COMPARISON: CT abdomen/pelvis dated 02/04/2024 and CT chest dated 03/23/2018. CLINICAL HISTORY: Polytrauma, blunt. FINDINGS: CHEST: MEDIASTINUM AND LYMPH NODES: Mild cardiomegaly. Left subclavian pacemaker. The central airways are clear. No mediastinal, hilar or axillary lymphadenopathy. LUNGS AND PLEURA: Mild subpleural scarring in the lungs bilaterally. No focal consolidation or pulmonary edema. No pleural effusion or pneumothorax. ABDOMEN AND PELVIS: LIVER: The liver is unremarkable. GALLBLADDER AND BILE DUCTS: Gallbladder is unremarkable. No biliary ductal dilatation. SPLEEN: No acute abnormality. PANCREAS: No acute abnormality. ADRENAL GLANDS: No acute abnormality. KIDNEYS, URETERS AND BLADDER: Status post left nephrectomy. No stones in the right kidney or ureter. No right hydronephrosis. No perinephric or periureteral stranding on the right. Thick-walled bladder with mild perivesical stranding (image 113), similar to the prior, suggesting cystitis. GI AND BOWEL: Stomach demonstrates no acute abnormality. There is no bowel obstruction. REPRODUCTIVE ORGANS: Status post hysterectomy. PERITONEUM AND RETROPERITONEUM: No ascites. No free air. VASCULATURE: Thoracic  aortic atherosclerosis. Atherosclerotic calcifications of the abdominal aorta and branch vessels. ABDOMINAL AND PELVIS LYMPH NODES: No lymphadenopathy. BONES AND SOFT TISSUES: Status post PLIF at L4-S1. Mild degenerative changes of the lower thoracic and lumbar spine, most prominent at L2-3. No acute osseous abnormality. No focal soft tissue abnormality. IMPRESSION: 1. No evidence of acute traumatic injury. 2. Thick-walled bladder with mild perivesical stranding, unchanged from prior, compatible with cystitis. 3. Additional stable ancillary findings, as above. Electronically signed by: Pinkie Pebbles MD 03/29/2024 10:24 PM EST RP Workstation: [id number]   CT Cervical Spine Wo Contrast Result Date: 03/29/2024 EXAM: CT CERVICAL SPINE WITHOUT CONTRAST 03/29/2024 10:18:20 PM TECHNIQUE: CT of the cervical spine was performed without the administration of intravenous contrast. Multiplanar reformatted images are provided for review. Automated exposure control, iterative reconstruction, and/or weight based adjustment of the mA/kV was utilized to reduce the radiation dose to as low as reasonably achievable. COMPARISON: None available. CLINICAL HISTORY: Neck trauma (Age >= 65y) FINDINGS: CERVICAL SPINE: BONES AND ALIGNMENT: No acute fracture or traumatic malalignment. DEGENERATIVE CHANGES: Mild degenerative changes at C4-C5. SOFT TISSUES: No prevertebral soft tissue swelling. IMPRESSION: 1. No acute abnormality of the cervical spine. Electronically signed by: Pinkie Pebbles MD 03/29/2024 10:20 PM EST RP Workstation: [id number]   CT Head Wo Contrast Result Date: 03/29/2024 EXAM: CT HEAD WITHOUT 03/29/2024 10:18:20 PM TECHNIQUE: CT of the head was performed without the administration of intravenous contrast. Automated exposure control, iterative reconstruction, and/or weight based adjustment of the mA/kV was utilized to reduce the radiation dose to as low as reasonably achievable. COMPARISON: None available. CLINICAL  HISTORY: Head trauma, intracranial arterial injury suspected. FINDINGS: BRAIN AND VENTRICLES: Global cortical atrophy. Subcortical and periventricular  small vessel ischemic changes. Mild intracranial atherosclerosis. No acute intracranial hemorrhage. No mass effect or midline shift. No extra-axial fluid collection. No evidence of acute infarct. No hydrocephalus. ORBITS: No acute abnormality. SINUSES AND MASTOIDS: No acute abnormality. SOFT TISSUES AND SKULL: No acute skull fracture. No acute soft tissue abnormality. IMPRESSION: 1. No acute intracranial abnormality. Electronically signed by: Pinkie Pebbles MD 03/29/2024 10:20 PM EST RP Workstation: [id number]    EKG: I independently viewed the EKG done and my findings are as followed: ***   Assessment/Plan Present on Admission:  UTI (urinary tract infection)  Active Problems:   UTI (urinary tract infection)   DVT prophylaxis: ***   Code Status: ***   Family Communication: ***   Disposition Plan: ***   Consults called: ***   Admission status: ***    *** Patient class is not currently inpatient or observation. Update patient class prior to completing documentation***    Terry LOISE Hurst MD [facility] Pager [phone]  If 7PM-7AM, please contact night-coverage [email or web address] Password [facility]  03/29/2024, 11:32 PM

## 2024-03-29 NOTE — ED Provider Notes (Signed)
 [facility] AT [facility] Provider Note   CSN: [id number] Arrival date & time: 03/29/24  2017     Patient presents with: Lorraine Leonard   Lorraine Leonard is a 83 y.o. female.  {Add pertinent medical, surgical, social history, OB history to YEP:67052} Patient has a history of hypertension, hypothyroidism, COPD and atrial fibs.  Patient has a paced rhythm.  She fell today and was found by her son.  He helped her back into a chair and she refused transport.  Patient is a DNR DNI.  Her son checked on her this evening and she had not gotten out of the chair.  She was complaining of hip pain and was lethargic   Fall       Prior to Admission medications   Medication Sig Start Date End Date Taking? Authorizing Provider  acetaminophen  (TYLENOL ) 325 MG tablet Take 2 tablets (650 mg total) by mouth every 6 (six) hours as needed for mild pain (pain score 1-3) or fever (or Fever >/= 101). 11/26/23   Emokpae, Courage, MD  albuterol  (VENTOLIN  HFA) 108 (90 Base) MCG/ACT inhaler INHALE 2 PUFFS INTO THE LUNGS EVERY 6 HOURS AS NEEDED FOR WHEEZE OR SHORTNESS OF BREATH Patient taking differently: Inhale 2 puffs into the lungs every 6 (six) hours as needed for wheezing or shortness of breath. 11/26/23   Pearlean Manus, MD  apixaban  (ELIQUIS ) 5 MG TABS tablet Take 1 tablet (5 mg total) by mouth 2 (two) times daily. 01/26/24   Lavell Bari LABOR, FNP  atenolol  (TENORMIN ) 25 MG tablet Take 1 tablet (25 mg total) by mouth daily. 01/26/24   Lavell Bari LABOR, FNP  calcium  citrate (CALCITRATE - DOSED IN MG ELEMENTAL CALCIUM ) 950 MG tablet Take 1 tablet by mouth daily.    [provider]  cephALEXin  (KEFLEX ) 250 MG capsule Take 250 mg by mouth daily. 01/24/24   [provider]  cyanocobalamin  1000 MCG tablet Take 1,000 mcg by mouth daily. Vitamin b12 every AM    [provider]  fluticasone  (FLONASE ) 50 MCG/ACT nasal spray PLACE 1 SPRAY INTO BOTH NOSTRILS 2 (TWO) TIMES DAILY  AS NEEDED FOR ALLERGIES OR RHINITIS. 03/09/24   Lavell Bari A, FNP  furosemide  (LASIX ) 40 MG tablet Take 1 tablet (40 mg total) by mouth every other day. 02/04/24   Yolande Lamar BROCKS, MD  Iron , Ferrous Sulfate , 325 (65 Fe) MG TABS Take 325 mg by mouth daily. 01/26/24   Lavell Bari A, FNP  levocetirizine (XYZAL ) 5 MG tablet TAKE 1 TABLET BY MOUTH EVERY DAY IN THE EVENING 02/10/24   Lavell Bari A, FNP  levothyroxine  (SYNTHROID ) 100 MCG tablet TAKE 1 TABLET BY MOUTH EVERY DAY 02/02/24   Lavell Bari A, FNP  liver oil-zinc  oxide (DESITIN) 40 % ointment Apply 1 Application topically as needed for irritation. 02/04/24   Yolande Lamar BROCKS, MD  Multiple Vitamins-Minerals (PRESERVISION AREDS 2) CAPS Take 1 capsule by mouth 2 (two) times daily.    [provider]  nystatin  (MYCOSTATIN /NYSTOP ) powder Apply 1 Application topically 3 (three) times daily. 01/26/24   Lavell Bari A, FNP  Olopatadine  HCl 0.2 % SOLN INSTILL 1 DROP INTO BOTH EYES EVERY DAY 04/30/20   Lavell Bari A, FNP  omeprazole  (PRILOSEC) 20 MG capsule TAKE 1 CAPSULE BY MOUTH EVERY DAY 03/02/24   Hawks, Bari A, FNP  Polyethyl Glycol-Propyl Glycol (SYSTANE) 0.4-0.3 % GEL ophthalmic gel Place 1 application into both eyes at bedtime as needed (dry eyes).    [provider]  predniSONE  (DELTASONE ) 5 MG tablet TAKE 1 TABLET (5 MG TOTAL) BY MOUTH 2 (TWO) TIMES DAILY WITH A MEAL. Patient not taking: Reported on 02/18/2024 06/18/23   Lavell Lye A, FNP  vitamin C  (ASCORBIC ACID ) 500 MG tablet Take 1,000 mg by mouth daily. AM    [provider]  Vitamin D , Ergocalciferol , (DRISDOL ) 1.25 MG (50000 UNIT) CAPS capsule TAKE 1 CAPSULE (50,000 UNITS TOTAL) BY MOUTH EVERY 7 (SEVEN) DAYS 09/26/22   Lavell Lye LABOR, FNP    Allergies: Macrodantin  [nitrofurantoin  macrocrystal], Mobic  [meloxicam ], and Allopurinol     Review of Systems  Updated Vital Signs BP (!) 96/36   Pulse 61   Temp 98 F (36.7 C)   Resp 18    Ht 5' 7 (1.702 m)   Wt 80 kg   SpO2 96%   BMI 27.62 kg/m   Physical Exam  (all labs ordered are listed, but only abnormal results are displayed) Labs Reviewed  BASIC METABOLIC PANEL WITH GFR - Abnormal; Notable for the following components:      Result Value   CO2 20 (*)    Glucose, Bld 148 (*)    BUN 26 (*)    Creatinine, Ser 1.91 (*)    GFR, Estimated 26 (*)    All other components within normal limits  URINALYSIS, W/ REFLEX TO CULTURE (INFECTION SUSPECTED) - Abnormal; Notable for the following components:   APPearance TURBID (*)    Hgb urine dipstick LARGE (*)    Protein, ur 100 (*)    Leukocytes,Ua MODERATE (*)    All other components within normal limits  LACTIC ACID, PLASMA - Abnormal; Notable for the following components:   Lactic Acid, Venous 2.2 (*)    All other components within normal limits  PROTIME-INR - Abnormal; Notable for the following components:   Prothrombin Time 27.6 (*)    INR 2.4 (*)    All other components within normal limits  HEPATIC FUNCTION PANEL - Abnormal; Notable for the following components:   AST 46 (*)    Bilirubin, Direct 0.3 (*)    All other components within normal limits  RESP PANEL BY RT-PCR (RSV, FLU A&B, COVID)  RVPGX2  CULTURE, BLOOD (ROUTINE X 2)  CULTURE, BLOOD (ROUTINE X 2)  URINE CULTURE  CBC  LACTIC ACID, PLASMA  TYPE AND SCREEN    EKG: None  Radiology: DG Hip Unilat W or Wo Pelvis 2-3 Views Left Result Date: 03/29/2024 CLINICAL DATA:  Fall with left-sided hip pain EXAM: DG HIP (WITH OR WITHOUT PELVIS) 2-3V LEFT COMPARISON:  CT 03/29/2024 FINDINGS: Hardware in the lumbo sacral spine. No definitive fracture or malalignment. IMPRESSION: No definite acute osseous abnormality. Electronically Signed   By: Luke Bun M.D.   On: 03/29/2024 22:42   DG Chest Port 1 View Result Date: 03/29/2024 CLINICAL DATA:  Possible sepsis EXAM: PORTABLE CHEST 1 VIEW COMPARISON:  Fall 05/27/2022, CT 03/29/2024, 07/19/2018 FINDINGS:  Left-sided pacing device as before. Enlarged cardiomediastinal silhouette with aortic atherosclerosis. Prominent mediastinum, likely due to portable technique and patient rotation. Mild reticular and interstitial opacities, corresponding to scarring on CT. No acute confluent airspace disease. No pleural effusion or pneumothorax. IMPRESSION: No active disease. Cardiomegaly with chronic changes. Electronically Signed   By: Luke Bun M.D.   On: 03/29/2024 22:40   CT CHEST ABDOMEN PELVIS WO CONTRAST Result Date: 03/29/2024 EXAM: CT CHEST, ABDOMEN AND PELVIS WITHOUT CONTRAST 03/29/2024 10:18:20 PM TECHNIQUE: CT of the chest, abdomen and pelvis was performed without  the administration of intravenous contrast. Multiplanar reformatted images are provided for review. Automated exposure control, iterative reconstruction, and/or weight based adjustment of the mA/kV was utilized to reduce the radiation dose to as low as reasonably achievable. COMPARISON: CT abdomen/pelvis dated 02/04/2024 and CT chest dated 03/23/2018. CLINICAL HISTORY: Polytrauma, blunt. FINDINGS: CHEST: MEDIASTINUM AND LYMPH NODES: Mild cardiomegaly. Left subclavian pacemaker. The central airways are clear. No mediastinal, hilar or axillary lymphadenopathy. LUNGS AND PLEURA: Mild subpleural scarring in the lungs bilaterally. No focal consolidation or pulmonary edema. No pleural effusion or pneumothorax. ABDOMEN AND PELVIS: LIVER: The liver is unremarkable. GALLBLADDER AND BILE DUCTS: Gallbladder is unremarkable. No biliary ductal dilatation. SPLEEN: No acute abnormality. PANCREAS: No acute abnormality. ADRENAL GLANDS: No acute abnormality. KIDNEYS, URETERS AND BLADDER: Status post left nephrectomy. No stones in the right kidney or ureter. No right hydronephrosis. No perinephric or periureteral stranding on the right. Thick-walled bladder with mild perivesical stranding (image 113), similar to the prior, suggesting cystitis. GI AND BOWEL: Stomach  demonstrates no acute abnormality. There is no bowel obstruction. REPRODUCTIVE ORGANS: Status post hysterectomy. PERITONEUM AND RETROPERITONEUM: No ascites. No free air. VASCULATURE: Thoracic aortic atherosclerosis. Atherosclerotic calcifications of the abdominal aorta and branch vessels. ABDOMINAL AND PELVIS LYMPH NODES: No lymphadenopathy. BONES AND SOFT TISSUES: Status post PLIF at L4-S1. Mild degenerative changes of the lower thoracic and lumbar spine, most prominent at L2-3. No acute osseous abnormality. No focal soft tissue abnormality. IMPRESSION: 1. No evidence of acute traumatic injury. 2. Thick-walled bladder with mild perivesical stranding, unchanged from prior, compatible with cystitis. 3. Additional stable ancillary findings, as above. Electronically signed by: Pinkie Pebbles MD 03/29/2024 10:24 PM EST RP Workstation: [id number]   CT Cervical Spine Wo Contrast Result Date: 03/29/2024 EXAM: CT CERVICAL SPINE WITHOUT CONTRAST 03/29/2024 10:18:20 PM TECHNIQUE: CT of the cervical spine was performed without the administration of intravenous contrast. Multiplanar reformatted images are provided for review. Automated exposure control, iterative reconstruction, and/or weight based adjustment of the mA/kV was utilized to reduce the radiation dose to as low as reasonably achievable. COMPARISON: None available. CLINICAL HISTORY: Neck trauma (Age >= 65y) FINDINGS: CERVICAL SPINE: BONES AND ALIGNMENT: No acute fracture or traumatic malalignment. DEGENERATIVE CHANGES: Mild degenerative changes at C4-C5. SOFT TISSUES: No prevertebral soft tissue swelling. IMPRESSION: 1. No acute abnormality of the cervical spine. Electronically signed by: Pinkie Pebbles MD 03/29/2024 10:20 PM EST RP Workstation: [id number]   CT Head Wo Contrast Result Date: 03/29/2024 EXAM: CT HEAD WITHOUT 03/29/2024 10:18:20 PM TECHNIQUE: CT of the head was performed without the administration of intravenous contrast. Automated exposure  control, iterative reconstruction, and/or weight based adjustment of the mA/kV was utilized to reduce the radiation dose to as low as reasonably achievable. COMPARISON: None available. CLINICAL HISTORY: Head trauma, intracranial arterial injury suspected. FINDINGS: BRAIN AND VENTRICLES: Global cortical atrophy. Subcortical and periventricular small vessel ischemic changes. Mild intracranial atherosclerosis. No acute intracranial hemorrhage. No mass effect or midline shift. No extra-axial fluid collection. No evidence of acute infarct. No hydrocephalus. ORBITS: No acute abnormality. SINUSES AND MASTOIDS: No acute abnormality. SOFT TISSUES AND SKULL: No acute skull fracture. No acute soft tissue abnormality. IMPRESSION: 1. No acute intracranial abnormality. Electronically signed by: Pinkie Pebbles MD 03/29/2024 10:20 PM EST RP Workstation: [id number]    {Document cardiac monitor, telemetry assessment procedure when appropriate:32947} Procedures   Medications Ordered in the ED  sodium chloride  0.9 % bolus 2,000 mL (2,000 mLs Intravenous New Bag/Given 03/29/24 2136)  cefTRIAXone  (ROCEPHIN ) 2 g in sodium chloride  0.9 %  100 mL IVPB (0 g Intravenous Stopped 03/29/24 2241)  sodium chloride  0.9 % bolus 1,000 mL (1,000 mLs Intravenous New Bag/Given 03/29/24 2337)   CRITICAL CARE Performed by: Fairy Sermon Total critical care time: 45 minutes Critical care time was exclusive of separately billable procedures and treating other patients. Critical care was necessary to treat or prevent imminent or life-threatening deterioration. Critical care was time spent personally by me on the following activities: development of treatment plan with patient and/or surrogate as well as nursing, discussions with consultants, evaluation of patient's response to treatment, examination of patient, obtaining history from patient or surrogate, ordering and performing treatments and interventions, ordering and review of laboratory  studies, ordering and review of radiographic studies, pulse oximetry and re-evaluation of patient's condition. Patient was hypotensive and lethargic.  Sepsis protocol was started.  And also CT head neck chest and abdomen was done to evaluate the patient from her fall.  CT scans were unremarkable.  Patient's hypotension improved with fluids.  She was treated with Rocephin  for urinary tract infection.   {Click here for ABCD2, HEART and other calculators REFRESH Note before signing:1}                              Medical Decision Making Amount and/or Complexity of Data Reviewed Labs: ordered. Radiology: ordered.  Risk Decision regarding hospitalization.   Patient with urinary tract infection and possible sepsis and dehydration.  She is admitted to medicine  {Document critical care time when appropriate  Document review of labs and clinical decision tools ie [id number], etc  Document your independent review of radiology images and any outside records  Document your discussion with family members, caretakers and with consultants  Document social determinants of health affecting pt's care  Document your decision making why or why not admission, treatments were needed:32947:::1}   Final diagnoses:  None    ED Discharge Orders     None

## 2024-03-30 DIAGNOSIS — W19XXXA Unspecified fall, initial encounter: Secondary | ICD-10-CM | POA: Diagnosis present

## 2024-03-30 DIAGNOSIS — Z7901 Long term (current) use of anticoagulants: Secondary | ICD-10-CM | POA: Diagnosis not present

## 2024-03-30 DIAGNOSIS — R4189 Other symptoms and signs involving cognitive functions and awareness: Secondary | ICD-10-CM

## 2024-03-30 DIAGNOSIS — Z66 Do not resuscitate: Secondary | ICD-10-CM | POA: Diagnosis present

## 2024-03-30 DIAGNOSIS — I5032 Chronic diastolic (congestive) heart failure: Secondary | ICD-10-CM | POA: Diagnosis present

## 2024-03-30 DIAGNOSIS — F419 Anxiety disorder, unspecified: Secondary | ICD-10-CM | POA: Diagnosis present

## 2024-03-30 DIAGNOSIS — I130 Hypertensive heart and chronic kidney disease with heart failure and stage 1 through stage 4 chronic kidney disease, or unspecified chronic kidney disease: Secondary | ICD-10-CM | POA: Diagnosis present

## 2024-03-30 DIAGNOSIS — Z7189 Other specified counseling: Secondary | ICD-10-CM

## 2024-03-30 DIAGNOSIS — G9341 Metabolic encephalopathy: Secondary | ICD-10-CM | POA: Diagnosis present

## 2024-03-30 DIAGNOSIS — N390 Urinary tract infection, site not specified: Secondary | ICD-10-CM | POA: Diagnosis present

## 2024-03-30 DIAGNOSIS — J441 Chronic obstructive pulmonary disease with (acute) exacerbation: Secondary | ICD-10-CM | POA: Diagnosis not present

## 2024-03-30 DIAGNOSIS — N3001 Acute cystitis with hematuria: Secondary | ICD-10-CM | POA: Diagnosis not present

## 2024-03-30 DIAGNOSIS — E039 Hypothyroidism, unspecified: Secondary | ICD-10-CM | POA: Diagnosis present

## 2024-03-30 DIAGNOSIS — J9611 Chronic respiratory failure with hypoxia: Secondary | ICD-10-CM | POA: Diagnosis present

## 2024-03-30 DIAGNOSIS — E869 Volume depletion, unspecified: Secondary | ICD-10-CM | POA: Diagnosis present

## 2024-03-30 DIAGNOSIS — N178 Other acute kidney failure: Secondary | ICD-10-CM | POA: Diagnosis not present

## 2024-03-30 DIAGNOSIS — I959 Hypotension, unspecified: Secondary | ICD-10-CM | POA: Diagnosis present

## 2024-03-30 DIAGNOSIS — Z515 Encounter for palliative care: Secondary | ICD-10-CM | POA: Diagnosis not present

## 2024-03-30 DIAGNOSIS — R159 Full incontinence of feces: Secondary | ICD-10-CM | POA: Diagnosis present

## 2024-03-30 DIAGNOSIS — E785 Hyperlipidemia, unspecified: Secondary | ICD-10-CM | POA: Diagnosis present

## 2024-03-30 DIAGNOSIS — A419 Sepsis, unspecified organism: Secondary | ICD-10-CM | POA: Diagnosis not present

## 2024-03-30 DIAGNOSIS — J8410 Pulmonary fibrosis, unspecified: Secondary | ICD-10-CM | POA: Diagnosis present

## 2024-03-30 DIAGNOSIS — N1832 Chronic kidney disease, stage 3b: Secondary | ICD-10-CM | POA: Diagnosis present

## 2024-03-30 DIAGNOSIS — I4821 Permanent atrial fibrillation: Secondary | ICD-10-CM | POA: Diagnosis present

## 2024-03-30 DIAGNOSIS — M069 Rheumatoid arthritis, unspecified: Secondary | ICD-10-CM | POA: Diagnosis present

## 2024-03-30 DIAGNOSIS — F0390 Unspecified dementia without behavioral disturbance: Secondary | ICD-10-CM | POA: Diagnosis present

## 2024-03-30 DIAGNOSIS — Z1152 Encounter for screening for COVID-19: Secondary | ICD-10-CM | POA: Diagnosis not present

## 2024-03-30 DIAGNOSIS — N179 Acute kidney failure, unspecified: Secondary | ICD-10-CM | POA: Diagnosis present

## 2024-03-30 DIAGNOSIS — J309 Allergic rhinitis, unspecified: Secondary | ICD-10-CM | POA: Diagnosis present

## 2024-03-30 DIAGNOSIS — F05 Delirium due to known physiological condition: Secondary | ICD-10-CM | POA: Diagnosis not present

## 2024-03-30 DIAGNOSIS — J449 Chronic obstructive pulmonary disease, unspecified: Secondary | ICD-10-CM | POA: Diagnosis present

## 2024-03-30 LAB — COMPREHENSIVE METABOLIC PANEL WITH GFR
ALT: 12 U/L (ref 0–44)
AST: 41 U/L (ref 15–41)
Albumin: 3.2 g/dL — ABNORMAL LOW (ref 3.5–5.0)
Alkaline Phosphatase: 72 U/L (ref 38–126)
Anion gap: 13 (ref 5–15)
BUN: 23 mg/dL (ref 8–23)
CO2: 16 mmol/L — ABNORMAL LOW (ref 22–32)
Calcium: 8.6 mg/dL — ABNORMAL LOW (ref 8.9–10.3)
Chloride: 111 mmol/L (ref 98–111)
Creatinine, Ser: 1.64 mg/dL — ABNORMAL HIGH (ref 0.44–1.00)
GFR, Estimated: 31 mL/min — ABNORMAL LOW (ref >60)
Glucose, Bld: 123 mg/dL — ABNORMAL HIGH (ref 70–99)
Potassium: 3.6 mmol/L (ref 3.5–5.1)
Sodium: 140 mmol/L (ref 135–145)
Total Bilirubin: 0.5 mg/dL (ref 0.0–1.2)
Total Protein: 5.8 g/dL — ABNORMAL LOW (ref 6.5–8.1)

## 2024-03-30 LAB — CBC WITH DIFFERENTIAL/PLATELET
Abs Immature Granulocytes: 0.03 K/uL (ref 0.00–0.07)
Basophils Absolute: 0.1 K/uL (ref 0.0–0.1)
Basophils Relative: 1 %
Eosinophils Absolute: 0.3 K/uL (ref 0.0–0.5)
Eosinophils Relative: 5 %
HCT: 34.8 % — ABNORMAL LOW (ref 36.0–46.0)
Hemoglobin: 10.9 g/dL — ABNORMAL LOW (ref 12.0–15.0)
Immature Granulocytes: 0 %
Lymphocytes Relative: 26 %
Lymphs Abs: 1.8 K/uL (ref 0.7–4.0)
MCH: 30.7 pg (ref 26.0–34.0)
MCHC: 31.3 g/dL (ref 30.0–36.0)
MCV: 98 fL (ref 80.0–100.0)
Monocytes Absolute: 1.2 K/uL — ABNORMAL HIGH (ref 0.1–1.0)
Monocytes Relative: 17 %
Neutro Abs: 3.5 K/uL (ref 1.7–7.7)
Neutrophils Relative %: 51 %
Platelets: 231 K/uL (ref 150–400)
RBC: 3.55 MIL/uL — ABNORMAL LOW (ref 3.87–5.11)
RDW: 15.2 % (ref 11.5–15.5)
WBC: 7 K/uL (ref 4.0–10.5)
nRBC: 0 % (ref 0.0–0.2)

## 2024-03-30 LAB — BLOOD GAS, VENOUS
Acid-base deficit: 6.1 mmol/L — ABNORMAL HIGH (ref 0.0–2.0)
Bicarbonate: 20.2 mmol/L (ref 20.0–28.0)
Drawn by: 4237
O2 Saturation: 45.7 %
Patient temperature: 36.9
pCO2, Ven: 42 mmHg — ABNORMAL LOW (ref 44–60)
pH, Ven: 7.29 (ref 7.25–7.43)
pO2, Ven: <31 mmHg — CL (ref 32–45)

## 2024-03-30 LAB — FOLATE: Folate: 13.1 ng/mL (ref >5.9)

## 2024-03-30 LAB — VITAMIN B12: Vitamin B-12: 654 pg/mL (ref 180–914)

## 2024-03-30 LAB — TSH: TSH: 4.73 u[IU]/mL — ABNORMAL HIGH (ref 0.350–4.500)

## 2024-03-30 LAB — CK: Total CK: 351 U/L — ABNORMAL HIGH (ref 38–234)

## 2024-03-30 LAB — PHOSPHORUS: Phosphorus: 2.8 mg/dL (ref 2.5–4.6)

## 2024-03-30 LAB — MAGNESIUM: Magnesium: 1.9 mg/dL (ref 1.7–2.4)

## 2024-03-30 LAB — LACTIC ACID, PLASMA: Lactic Acid, Venous: 1.5 mmol/L (ref 0.5–1.9)

## 2024-03-30 MED ORDER — MELATONIN 3 MG PO TABS
6.0000 mg | ORAL_TABLET | Freq: Every evening | ORAL | Status: DC | PRN
  Administered 2024-04-01: 6 mg via ORAL
  Filled 2024-03-30: qty 2

## 2024-03-30 MED ORDER — ACETAMINOPHEN 500 MG PO TABS
500.0000 mg | ORAL_TABLET | Freq: Four times a day (QID) | ORAL | Status: DC | PRN
  Administered 2024-03-30 (×2): 500 mg via ORAL
  Filled 2024-03-30 (×2): qty 1

## 2024-03-30 MED ORDER — LACTATED RINGERS IV SOLN: INTRAVENOUS | Status: AC

## 2024-03-30 MED ORDER — PANTOPRAZOLE SODIUM 40 MG PO TBEC
40.0000 mg | DELAYED_RELEASE_TABLET | Freq: Every day | ORAL | Status: DC
  Administered 2024-03-30 – 2024-04-02 (×4): 40 mg via ORAL
  Filled 2024-03-30 (×4): qty 1

## 2024-03-30 MED ORDER — SODIUM CHLORIDE 0.9 % IV SOLN
2.0000 g | INTRAVENOUS | Status: DC
  Administered 2024-03-30 – 2024-04-01 (×3): 2 g via INTRAVENOUS
  Filled 2024-03-30 (×3): qty 20

## 2024-03-30 MED ORDER — ATENOLOL 25 MG PO TABS: 12.5000 mg | ORAL_TABLET | Freq: Every day | ORAL | Status: DC

## 2024-03-30 MED ORDER — ENOXAPARIN SODIUM 30 MG/0.3ML IJ SOSY: 30.0000 mg | PREFILLED_SYRINGE | INTRAMUSCULAR | Status: DC

## 2024-03-30 MED ORDER — LEVOTHYROXINE SODIUM 100 MCG PO TABS
100.0000 ug | ORAL_TABLET | Freq: Every day | ORAL | Status: DC
  Administered 2024-03-30 – 2024-04-02 (×4): 100 ug via ORAL
  Filled 2024-03-30 (×4): qty 1

## 2024-03-30 MED ORDER — APIXABAN 5 MG PO TABS
5.0000 mg | ORAL_TABLET | Freq: Two times a day (BID) | ORAL | Status: DC
  Administered 2024-03-30: 5 mg via ORAL
  Filled 2024-03-30 (×2): qty 1

## 2024-03-30 MED ORDER — APIXABAN 2.5 MG PO TABS
2.5000 mg | ORAL_TABLET | Freq: Two times a day (BID) | ORAL | Status: DC
  Administered 2024-03-30 – 2024-04-02 (×7): 2.5 mg via ORAL
  Filled 2024-03-30 (×7): qty 1

## 2024-03-30 MED ORDER — POLYETHYLENE GLYCOL 3350 17 G PO PACK: 17.0000 g | PACK | Freq: Every day | ORAL | Status: DC | PRN

## 2024-03-30 MED ORDER — ACETAMINOPHEN 325 MG PO TABS
650.0000 mg | ORAL_TABLET | Freq: Four times a day (QID) | ORAL | Status: DC | PRN
  Administered 2024-03-31 – 2024-04-01 (×3): 650 mg via ORAL
  Filled 2024-03-30 (×3): qty 2

## 2024-03-30 MED ORDER — PROCHLORPERAZINE EDISYLATE 10 MG/2ML IJ SOLN: 5.0000 mg | Freq: Four times a day (QID) | INTRAMUSCULAR | Status: DC | PRN

## 2024-03-30 NOTE — Hospital Course (Signed)
 83 year old female with a history of pulmonary fibrosis/ILD, hypothyroidism, permanent atrial fibrillation status post ablation and PPM, HFpEF, hypertension, hyperlipidemia, and CKD stage IIIb, and chronic respiratory failure on 3 L presenting after being found lethargic by her son.  Per family member, her son [name], via phone, the patient is in hospice care and is a DNR/DNI.  The patient was found at home lethargic.  In the ED, the patient was afebrile with soft blood pressures in the upper 90s and low 100s.  She was hemodynamically stable.  Oxygen  saturation was 98% room air.  WBC 7.1, hemoglobin 12.5, platelets 234.  Sodium 138, 3.7, bicarbonate 20, serum creatinine 1.91.  LFTs were unremarkable.  Lactic acid 2.2>> 1.5.  UA > 50 WBC.  COVID-19 PCR was negative.  CT brain and CT cervical spine were negative for any acute findings.  X-ray of the left pelvis was negative.  Chest x-ray was negative.  The patient was started on IV fluids and ceftriaxone .  On 04/01/24, pt had a little more sob and wheezing.  CXR showed coarsen interstitial markings.  She was felt to have exacerbation of her ILD/COPD.  Patient was started BDs and IV steroids.

## 2024-03-30 NOTE — Progress Notes (Signed)
 Remote PPM Transmission

## 2024-03-30 NOTE — Consult Note (Signed)
 Consultation Note Date: 03/30/2024   Patient Name: Lorraine Leonard  DOB: 15-Aug-1940  MRN: [id number]  Age / Sex: 83 y.o., female  PCP: Lavell Bari LABOR, FNP Referring Physician: Evonnie Lenis, MD  Reason for Consultation:  GOC discussions  HPI/Patient Profile: 83 y.o. female  with past medical history of a-fib, HTN, HLD, HFpEF, dementia, CKD 3b, solitary kidney, pulmonary fibrosis- home oxygen  3L, admitted on 03/29/2024 with UTI with metabolic encephalopathy. Patient was on  hospice prior to admission. Palliative medicine consulted for GOC discussion.    Primary Decision Maker NEXT OF KIN- daughter Cherise in conjuction with patient's other children  Discussion: Chart reviewed including labs, progress notes, imaging from this and previous encounters.  Labs- CK up- 351, Cr 1.64, GFR 31, normal WBC, lactic acid up on admission but normal today. Blood cultures negative. UA positive for leukocytes- culture pending.  Chest xray normal.  On evaluation patient somnolent. Did not wake to my voice.  Her son, [name] was at bedside.  Per [name] patient was living home by herself prior to admission with assistance from hospice. [name] was unsure of why patient was on hospice or what the expectations were for her medical care. He deferred to his sister Cherise.  I called Torry.  Torry reports that patient has had ongoing decline. Has lost a good deal of weight and is not eating. Her cognitive status has declined and while she hasn't formally been diagnosed with dementia she presents with many symptoms of dementia such as getting lost in her home, not recognizing family members, hallucinating at times that people are coming her home.  Torry's brothers have found patient unclothed and asleep on her couch. Brother found her locked in her bathroom, curled up on the floor, preceding this admission.  She had problems with fecal incontinence.  This decline has been worsening since the death of her spouse, and later the death of her son. Cherise does not feel that patient can return home to her previous living situation alone.  Patient was receiving hospice services so that she could have increased assistance as she did not qualify through for PCA through Medicaid.  We discussed the philosophy of hospice care- supporting someone through end of life with focus on quality over quantity. Torry shared that they had not discussed that philosophy of care when patient was enrolled- that she only was enrolled with hospice so that she could get the care at home.  We discussed patient's current acute and chronic illnesses that are affecting her quality of life.  I inquired about patient's quality of life and her preferences for ongoing treatments of her illnesses. Torry noted that patient has poor quality of life and has expressed to her that she is tired and is just ready 'to go'.  I inquired if there is a point where patient would not want to be in the hospital and do treatments such as IV fluids and antibiotics to reverse illness- rather she would prefer to just be comfortable and supported through end of life  and dying.  Torry noted that it is possible that would be her mom's preference now- however, she would need to discuss this with her brothers.  For now, goals are to continue to treat what is treatable, hoping for patient to discharge to a nursing facility. She will likely need facility care for the duration of her life as she cannot return home.  If patient fails to improve this admission then Cherise is open to discussing transitioning to comfort and hospice care.     SUMMARY OF RECOMMENDATIONS -UTI with metabolic encephalopathy- continue to treat- if patient fails to improve or worsens, then family open to discussion re: comfort -Likely dementia and multiple other comorbidities- encouarged family to consider if patient has another decline in  future- how aggressive would they wish to be - ie return hospitalization vs comfort/hospice- family to discuss together    Code Status/Advance Care Planning:   Code Status: Limited: Do not attempt resuscitation (DNR) -DNR-LIMITED -Do Not Intubate/DNI     Prognosis:   Unable to determine  Discharge Planning: To Be Determined  Primary Diagnoses: Present on Admission:  UTI (urinary tract infection)   Review of Systems  Unable to perform ROS: Mental status change    Physical Exam Vitals and nursing note reviewed.  Constitutional:      General: She is not in acute distress.    Appearance: She is ill-appearing.  Cardiovascular:     Rate and Rhythm: Normal rate.  Pulmonary:     Effort: Pulmonary effort is normal.  Neurological:     Comments: somnolent     Vital Signs: BP (!) 117/48 (BP Location: Left Arm)   Pulse 65   Temp 98.4 F (36.9 C) (Oral)   Resp 20   Ht 5' 7 (1.702 m)   Wt 74.6 kg   SpO2 98%   BMI 25.76 kg/m  Pain Scale: 0-10   Pain Score: 3    SpO2: SpO2: 98 % O2 Device:SpO2: 98 % O2 Flow Rate: .   IO: Intake/output summary:  Intake/Output Summary (Last 24 hours) at 03/30/2024 1339 Last data filed at 03/30/2024 0944 Gross per 24 hour  Intake 3515.21 ml  Output --  Net 3515.21 ml    LBM: Last BM Date :  (Pt unable to recall) Baseline Weight: Weight: 80 kg Most recent weight: Weight: 74.6 kg       Thank you for this consult. Palliative medicine will continue to follow and assist as needed.  Time Total: 85 minutes Signed by: Cassondra Stain, AGNP-C Palliative Medicine  Time includes:   Preparing to see the patient (e.g., review of tests) Obtaining and/or reviewing separately obtained history Performing a medically necessary appropriate examination and/or evaluation Counseling and educating the patient/family/caregiver Ordering medications, tests, or procedures Referring and communicating with other health care professionals (when not  reported separately) Documenting clinical information in the electronic or other health record Independently interpreting results (not reported separately) and communicating results to the patient/family/caregiver Care coordination (not reported separately) Clinical documentation   Please contact Palliative Medicine Team phone at [phone] for questions and concerns.  For individual provider: See Tracey

## 2024-03-30 NOTE — TOC Initial Note (Addendum)
 Transition of Care [facility]) - Initial/Assessment Note    Patient Details  Name: Lorraine Leonard MRN: [id number] Date of Birth: Jul 23, 1940  [facility] CM/SW Contact:    Sharlyne Stabs, RN Phone Number: 03/30/2024, 12:24 PM  Clinical Narrative:     Patient admitted with UTI. CM spoke with her grand daughter. Patient is active with [facility]). Patient lives home alone. Grand daughter and family are concerned and realizes she needs more care. They want to make sure she is getting up and getting PT in hospital. CM called [facility] to speak with her social worker, [place], [place] [phone]) and Wanda RN [phone]), left messages.  Grand daughter did not know if they are working on a bed in nursing home or if patient could get HHPT in the home. IPCM following.               Addendum: Palliative met with family, if improves they will want SNF. For now treat UTI and continue with hospice services. Patient will need PT eval when stable to plan discharge.   Expected Discharge Plan: Home w Hospice Care Barriers to Discharge: Continued Medical Work up   Patient Goals and CMS Choice Patient states their goals for this hospitalization and ongoing recovery are:: return home CMS Medicare.gov Compare Post Acute Care list provided to:: Patient Represenative (must comment) Choice offered to / list presented to : Adult Children [place] ownership interest in [facility].provided to:: Adult Children    Expected Discharge Plan and Services      Living arrangements for the past 2 months: Single Family Home          Prior Living Arrangements/Services Living arrangements for the past 2 months: Single Family Home Lives with:: Self Patient language and need for interpreter reviewed:: Yes        Need for Family Participation in Patient Care: Yes (Comment) Care giver support system in place?: Yes (comment) Current home services: DME Criminal Activity/Legal  Involvement Pertinent to Current Situation/Hospitalization: No - Comment as needed       Emotional Assessment      Orientation: : Oriented to Self Alcohol  / Substance Use: Not Applicable Psych Involvement: No (comment)  Admission diagnosis:  UTI (urinary tract infection) [N39.0] Patient Active Problem List   Diagnosis Date Noted   Acute metabolic encephalopathy 03/30/2024   Chronic heart failure with preserved ejection fraction (HFpEF) (HCC) 03/30/2024   UTI (urinary tract infection) 03/29/2024   Candidal intertrigo 01/29/2024   Complete heart block (HCC) 01/29/2024   Acute kidney injury 01/28/2024   Cystitis 01/28/2024   Hypercalcemia 01/28/2024   History of COPD 01/28/2024   Wound of left leg 11/24/2023   Left leg cellulitis 11/24/2023   Recurrent UTI 10/06/2023   Atrophic vaginitis 10/06/2023   Immunosuppression due to drug therapy 10/06/2023   Ambulatory dysfunction 10/06/2023   Dependent for transportation 10/06/2023   Epigastric pain 03/27/2021   Gastric nodule 03/27/2021   Iron  deficiency anemia due to chronic blood loss 03/27/2021   History of colonic polyps 03/27/2021   Intractable back pain 07/08/2020   CKD (chronic kidney disease), stage 3b (HCC) 07/08/2020   Aorto-iliac atherosclerosis 07/08/2020   Chronic diastolic heart failure (HCC) 07/08/2020   Asthmatic bronchitis , chronic (HCC) 03/19/2020   Idiopathic urticaria 10/19/2018   Osteoarthritis 07/22/2018   Chronic respiratory failure with hypoxia (HCC) 03/26/2017   COPD (chronic obstructive pulmonary disease) (HCC) 03/26/2017   Permanent atrial fibrillation (HCC) 02/09/2017  Acute diastolic CHF (congestive heart failure) (HCC) 11/11/2016   Pulmonary fibrosis (HCC) 09/23/2016   Type 2 diabetes mellitus (HCC) 07/17/2016   Obesity (BMI 30-39.9) 04/18/2016   Snoring 11/09/2015   Pulmonary hypertension (HCC) 11/09/2015   Gout 10/19/2015   Chronic urticaria 03/24/2015   Metabolic syndrome 03/01/2015    Solitary kidney, acquired 03/01/2015   Macular degeneration of both eyes 11/28/2014   Constipation 11/28/2014   Chronic rhinitis 11/17/2014   Hypertensive retinopathy 10/30/2014   Vitamin D  deficiency 08/17/2014   Arthritis of knee 08/17/2014   Hyperlipidemia associated with type 2 diabetes mellitus (HCC) 08/17/2014   Depression    Acquired hypothyroidism    Anxiety    Cardiomegaly 05/05/2011   Essential hypertension 02/08/2007   PULMONARY NODULE 02/08/2007   GASTROESOPHAGEAL REFLUX DISEASE 02/08/2007   PCP:  Lavell Bari LABOR, FNP Pharmacy:   [facility] [phone] - [place], [place] - [address] [place] [address] Phone: [phone] Fax: [phone]     Social Drivers of Health (SDOH) Social History: SDOH Screenings   Food Insecurity: Patient Unable To Answer (03/30/2024)  Housing: Patient Unable To Answer (03/30/2024)  Transportation Needs: Patient Unable To Answer (03/30/2024)  Recent Concern: Transportation Needs - Unmet Transportation Needs (02/17/2024)  Utilities: Patient Unable To Answer (03/30/2024)  Alcohol  Screen: Low Risk  (02/17/2024)  Depression (PHQ2-9): Low Risk  (02/17/2024)  Recent Concern: Depression (PHQ2-9) - High Risk (01/26/2024)  Financial Resource Strain: Low Risk  (02/17/2024)  Physical Activity: Inactive (02/17/2024)  Social Connections: Patient Unable To Answer (03/30/2024)  Recent Concern: Social Connections - Socially Isolated (02/17/2024)  Stress: No Stress Concern Present (02/17/2024)  Recent Concern: Stress - Stress Concern Present (12/29/2023)  Tobacco Use: Low Risk  (03/29/2024)  Health Literacy: Adequate Health Literacy (02/17/2024)   SDOH Interventions:     Readmission Risk Interventions    01/29/2024    2:55 PM  Readmission Risk Prevention Plan  Transportation Screening Complete  Home Care Screening Complete  Medication Review (RN CM) Complete

## 2024-03-30 NOTE — Progress Notes (Signed)
 Called the patient's son, Jadalee Westcott, and provided updates via phone.  He confirms Code status DNR/DNI and that the patient is hospice care patient.  It is okay to treat with antibiotics and IV fluid.  All questions answered to the best of my ability.  No charge note.

## 2024-03-30 NOTE — Plan of Care (Signed)

## 2024-03-30 NOTE — ED Notes (Signed)
 Spoke with Dr Shona regarding family stating pt is DNR & on hospice. States she will call to clarify code status & treatment plan with family.

## 2024-03-30 NOTE — Progress Notes (Signed)
 PROGRESS NOTE  Lorraine Leonard FMW:[id number] DOB: [birth year] DOA: 03/29/2024 PCP: Lavell Bari LABOR, FNP  Brief History:  83 year old female with a history of pulmonary fibrosis, hypothyroidism, paroxysmal atrial fibrillation status post ablation and PPM, HFpEF, hypertension, hyperlipidemia, and CKD stage IIIb, and chronic respiratory failure on 3 L presenting after being found lethargic by her son.  Per family member, her son [name], via phone, the patient is in hospice care and is a DNR/DNI.  The patient was found at home lethargic.  In the ED, the patient was afebrile with soft blood pressures in the upper 90s and low 100s.  She was hemodynamically stable.  Oxygen  saturation was 98% room air.  WBC 7.1, hemoglobin 12.5, platelets 234.  Sodium 138, 3.7, bicarbonate 20, serum creatinine 1.91.  LFTs were unremarkable.  Lactic acid 2.2>> 1.5.  UA > 50 WBC.  COVID-19 PCR was negative.  CT brain and CT cervical spine were negative for any acute findings.  X-ray of the left pelvis was negative.  Chest x-ray was negative.  The patient was started on IV fluids and ceftriaxone .     Assessment/Plan: Acute metabolic encephalopathy - Secondary to UTI - Check VBG - B12 -folate - TSH -CK - Remains confused and somnolent  UTI -UA>50 WBC Continue ceftriaxone  pending culture data-  Paroxysmal atrial fibrillation - Status post AV node ablation and PPM - Continue apixaban  - Holding atenolol  secondary to soft blood pressure  Hypothyroidism - Continue Synthroid   GERD - Continue pantoprazole   Goals of care - DNR/DNI confirmed with family  Chronic HFpEF - Clinically compensated - 07/23/2022 echo EF 65 to 70%, no WMA, normal RVF - On furosemide  every 48 hours at home--holding temporarily  COPD - Stable without exacerbation - No respiratory distress       Family Communication: no  Family at bedside  Consultants:  none  Code Status:  DNR  DVT Prophylaxis:   apixaban    Procedures: As Listed in Progress Note Above  Antibiotics: Ceftriaxone  12/9>>      Subjective: Patient is somnolent.  She is able to follow one-step commands.  She arouses easily to voice.  Review of system is limited secondary to encephalopathy.  Objective: Vitals:   03/30/24 0110 03/30/24 0115 03/30/24 0245 03/30/24 0738  BP:  (!) 135/96 (!) 109/48 98/66  Pulse:  65 61 (!) 59  Resp:  18 19 20   Temp: 98 F (36.7 C)  (!) 97.5 F (36.4 C) 98.7 F (37.1 C)  TempSrc: Axillary  Oral Axillary  SpO2:  98% 100% 95%  Weight:   74.6 kg   Height:   5' 7 (1.702 m)     Intake/Output Summary (Last 24 hours) at 03/30/2024 0806 Last data filed at 03/30/2024 0400 Gross per 24 hour  Intake 3155.21 ml  Output --  Net 3155.21 ml   Weight change:  Exam:  General:  Pt is alert, follows commands appropriately, not in acute distress HEENT: No icterus, No thrush, No neck mass, Wilder/AT Cardiovascular: RRR, S1/S2, no rubs, no gallops Respiratory: CTA bilaterally, no wheezing, no crackles, no rhonchi Abdomen: Soft/+BS, non tender, non distended, no guarding Extremities: 1 + LE edema, No lymphangitis, No petechiae, No rashes, no synovitis   Data Reviewed: I have personally reviewed following labs and imaging studies Basic Metabolic Panel: Recent Labs  Lab 03/29/24 2031 03/30/24 0411  NA 138 140  K 3.7 3.6  CL 105 111  CO2 20* 16*  GLUCOSE  148* 123*  BUN 26* 23  CREATININE 1.91* 1.64*  CALCIUM  9.9 8.6*  MG  --  1.9  PHOS  --  2.8   Liver Function Tests: Recent Labs  Lab 03/29/24 2031 03/30/24 0411  AST 46* 41  ALT 14 12  ALKPHOS 79 72  BILITOT 0.7 0.5  PROT 6.8 5.8*  ALBUMIN 3.6 3.2*   No results for input(s): LIPASE, AMYLASE in the last 168 hours. No results for input(s): AMMONIA in the last 168 hours. Coagulation Profile: Recent Labs  Lab 03/29/24 2031  INR 2.4*   CBC: Recent Labs  Lab 03/29/24 2105 03/30/24 0411  WBC 7.1 7.0   NEUTROABS  --  3.5  HGB 12.5 10.9*  HCT 38.2 34.8*  MCV 94.1 98.0  PLT 234 231   Cardiac Enzymes: No results for input(s): CKTOTAL, CKMB, CKMBINDEX, TROPONINI in the last 168 hours. BNP: Invalid input(s): POCBNP CBG: No results for input(s): GLUCAP in the last 168 hours. HbA1C: No results for input(s): HGBA1C in the last 72 hours. Urine analysis:    Component Value Date/Time   COLORURINE YELLOW 03/29/2024 2045   APPEARANCEUR TURBID (A) 03/29/2024 2045   APPEARANCEUR Clear 01/26/2024 1545   LABSPEC 1.015 03/29/2024 2045   PHURINE 5.0 03/29/2024 2045   GLUCOSEU NEGATIVE 03/29/2024 2045   HGBUR LARGE (A) 03/29/2024 2045   BILIRUBINUR NEGATIVE 03/29/2024 2045   BILIRUBINUR Negative 01/26/2024 1545   KETONESUR NEGATIVE 03/29/2024 2045   PROTEINUR 100 (A) 03/29/2024 2045   UROBILINOGEN negative 04/06/2015 1517   UROBILINOGEN 0.2 05/14/2011 1942   NITRITE NEGATIVE 03/29/2024 2045   LEUKOCYTESUR MODERATE (A) 03/29/2024 2045   Sepsis Labs: @LABRCNTIP (procalcitonin:4,lacticidven:4) ) Recent Results (from the past 240 hours)  Blood Culture (routine x 2)     Status: None (Preliminary result)   Collection Time: 03/29/24  9:29 PM   Specimen: BLOOD  Result Value Ref Range Status   Specimen Description BLOOD BLOOD LEFT ARM  Final   Special Requests   Final    BOTTLES DRAWN AEROBIC AND ANAEROBIC Blood Culture adequate volume   Culture   Final    NO GROWTH < 12 HOURS Performed at [facility], [address] [place], [place] [address]    Report Status PENDING  Incomplete  Blood Culture (routine x 2)     Status: None (Preliminary result)   Collection Time: 03/29/24  9:30 PM   Specimen: BLOOD  Result Value Ref Range Status   Specimen Description BLOOD BLOOD RIGHT WRIST  Final   Special Requests   Final    BOTTLES DRAWN AEROBIC AND ANAEROBIC Blood Culture results may not be optimal due to an inadequate volume of blood received in culture bottles   Culture   Final     NO GROWTH < 12 HOURS Performed at [facility], [address], [place], [place] [address]    Report Status PENDING  Incomplete  Resp panel by RT-PCR (RSV, Flu A&B, Covid) Anterior Nasal Swab     Status: None   Collection Time: 03/29/24  9:36 PM   Specimen: Anterior Nasal Swab  Result Value Ref Range Status   SARS Coronavirus 2 by RT PCR NEGATIVE NEGATIVE Final    Comment: (NOTE) SARS-CoV-2 target nucleic acids are NOT DETECTED.  The SARS-CoV-2 RNA is generally detectable in upper respiratory specimens during the acute phase of infection. The lowest concentration of SARS-CoV-2 viral copies this assay can detect is 138 copies/mL. A negative result does not preclude SARS-Cov-2 infection and should not be  used as the sole basis for treatment or other patient management decisions. A negative result may occur with  improper specimen collection/handling, submission of specimen other than nasopharyngeal swab, presence of viral mutation(s) within the areas targeted by this assay, and inadequate number of viral copies(<138 copies/mL). A negative result must be combined with clinical observations, patient history, and epidemiological information. The expected result is Negative.  Fact Sheet for Patients:  bloggercourse.com  Fact Sheet for Healthcare Providers:  seriousbroker.it  This test is no t yet approved or cleared by the [place]  FDA and  has been authorized for detection and/or diagnosis of SARS-CoV-2 by FDA under an Emergency Use Authorization (EUA). This EUA will remain  in effect (meaning this test can be used) for the duration of the COVID-19 declaration under Section 564(b)(1) of the Act, 21 U.S.C.section 360bbb-3(b)(1), unless the authorization is terminated  or revoked sooner.       Influenza A by PCR NEGATIVE NEGATIVE Final   Influenza B by PCR NEGATIVE NEGATIVE Final    Comment: (NOTE) The Xpert Xpress  SARS-CoV-2/FLU/RSV plus assay is intended as an aid in the diagnosis of influenza from Nasopharyngeal swab specimens and should not be used as a sole basis for treatment. Nasal washings and aspirates are unacceptable for Xpert Xpress SARS-CoV-2/FLU/RSV testing.  Fact Sheet for Patients: bloggercourse.com  Fact Sheet for Healthcare Providers: seriousbroker.it  This test is not yet approved or cleared by the [place]  FDA and has been authorized for detection and/or diagnosis of SARS-CoV-2 by FDA under an Emergency Use Authorization (EUA). This EUA will remain in effect (meaning this test can be used) for the duration of the COVID-19 declaration under Section 564(b)(1) of the Act, 21 U.S.C. section 360bbb-3(b)(1), unless the authorization is terminated or revoked.     Resp Syncytial Virus by PCR NEGATIVE NEGATIVE Final    Comment: (NOTE) Fact Sheet for Patients: bloggercourse.com  Fact Sheet for Healthcare Providers: seriousbroker.it  This test is not yet approved or cleared by the [place]  FDA and has been authorized for detection and/or diagnosis of SARS-CoV-2 by FDA under an Emergency Use Authorization (EUA). This EUA will remain in effect (meaning this test can be used) for the duration of the COVID-19 declaration under Section 564(b)(1) of the Act, 21 U.S.C. section 360bbb-3(b)(1), unless the authorization is terminated or revoked.  Performed at [facility], [address], [place], [place] [address]      Scheduled Meds:  apixaban   5 mg Oral BID   levothyroxine   100 mcg Oral Q0600   pantoprazole   40 mg Oral Daily   Continuous Infusions:  lactated ringers  75 mL/hr at 03/30/24 9685    Procedures/Studies: DG Hip Unilat W or Wo Pelvis 2-3 Views Left Result Date: 03/29/2024 CLINICAL DATA:  Fall with left-sided hip pain EXAM: DG HIP (WITH OR WITHOUT PELVIS) 2-3V  LEFT COMPARISON:  CT 03/29/2024 FINDINGS: Hardware in the lumbo sacral spine. No definitive fracture or malalignment. IMPRESSION: No definite acute osseous abnormality. Electronically Signed   By: Luke Bun M.D.   On: 03/29/2024 22:42   DG Chest Port 1 View Result Date: 03/29/2024 CLINICAL DATA:  Possible sepsis EXAM: PORTABLE CHEST 1 VIEW COMPARISON:  Fall 05/27/2022, CT 03/29/2024, 07/19/2018 FINDINGS: Left-sided pacing device as before. Enlarged cardiomediastinal silhouette with aortic atherosclerosis. Prominent mediastinum, likely due to portable technique and patient rotation. Mild reticular and interstitial opacities, corresponding to scarring on CT. No acute confluent airspace disease. No pleural effusion or pneumothorax. IMPRESSION: No active disease. Cardiomegaly  with chronic changes. Electronically Signed   By: Luke Bun M.D.   On: 03/29/2024 22:40   CT CHEST ABDOMEN PELVIS WO CONTRAST Result Date: 03/29/2024 EXAM: CT CHEST, ABDOMEN AND PELVIS WITHOUT CONTRAST 03/29/2024 10:18:20 PM TECHNIQUE: CT of the chest, abdomen and pelvis was performed without the administration of intravenous contrast. Multiplanar reformatted images are provided for review. Automated exposure control, iterative reconstruction, and/or weight based adjustment of the mA/kV was utilized to reduce the radiation dose to as low as reasonably achievable. COMPARISON: CT abdomen/pelvis dated 02/04/2024 and CT chest dated 03/23/2018. CLINICAL HISTORY: Polytrauma, blunt. FINDINGS: CHEST: MEDIASTINUM AND LYMPH NODES: Mild cardiomegaly. Left subclavian pacemaker. The central airways are clear. No mediastinal, hilar or axillary lymphadenopathy. LUNGS AND PLEURA: Mild subpleural scarring in the lungs bilaterally. No focal consolidation or pulmonary edema. No pleural effusion or pneumothorax. ABDOMEN AND PELVIS: LIVER: The liver is unremarkable. GALLBLADDER AND BILE DUCTS: Gallbladder is unremarkable. No biliary ductal dilatation.  SPLEEN: No acute abnormality. PANCREAS: No acute abnormality. ADRENAL GLANDS: No acute abnormality. KIDNEYS, URETERS AND BLADDER: Status post left nephrectomy. No stones in the right kidney or ureter. No right hydronephrosis. No perinephric or periureteral stranding on the right. Thick-walled bladder with mild perivesical stranding (image 113), similar to the prior, suggesting cystitis. GI AND BOWEL: Stomach demonstrates no acute abnormality. There is no bowel obstruction. REPRODUCTIVE ORGANS: Status post hysterectomy. PERITONEUM AND RETROPERITONEUM: No ascites. No free air. VASCULATURE: Thoracic aortic atherosclerosis. Atherosclerotic calcifications of the abdominal aorta and branch vessels. ABDOMINAL AND PELVIS LYMPH NODES: No lymphadenopathy. BONES AND SOFT TISSUES: Status post PLIF at L4-S1. Mild degenerative changes of the lower thoracic and lumbar spine, most prominent at L2-3. No acute osseous abnormality. No focal soft tissue abnormality. IMPRESSION: 1. No evidence of acute traumatic injury. 2. Thick-walled bladder with mild perivesical stranding, unchanged from prior, compatible with cystitis. 3. Additional stable ancillary findings, as above. Electronically signed by: Pinkie Pebbles MD 03/29/2024 10:24 PM EST RP Workstation: [id number]   CT Cervical Spine Wo Contrast Result Date: 03/29/2024 EXAM: CT CERVICAL SPINE WITHOUT CONTRAST 03/29/2024 10:18:20 PM TECHNIQUE: CT of the cervical spine was performed without the administration of intravenous contrast. Multiplanar reformatted images are provided for review. Automated exposure control, iterative reconstruction, and/or weight based adjustment of the mA/kV was utilized to reduce the radiation dose to as low as reasonably achievable. COMPARISON: None available. CLINICAL HISTORY: Neck trauma (Age >= 65y) FINDINGS: CERVICAL SPINE: BONES AND ALIGNMENT: No acute fracture or traumatic malalignment. DEGENERATIVE CHANGES: Mild degenerative changes at C4-C5.  SOFT TISSUES: No prevertebral soft tissue swelling. IMPRESSION: 1. No acute abnormality of the cervical spine. Electronically signed by: Pinkie Pebbles MD 03/29/2024 10:20 PM EST RP Workstation: [id number]   CT Head Wo Contrast Result Date: 03/29/2024 EXAM: CT HEAD WITHOUT 03/29/2024 10:18:20 PM TECHNIQUE: CT of the head was performed without the administration of intravenous contrast. Automated exposure control, iterative reconstruction, and/or weight based adjustment of the mA/kV was utilized to reduce the radiation dose to as low as reasonably achievable. COMPARISON: None available. CLINICAL HISTORY: Head trauma, intracranial arterial injury suspected. FINDINGS: BRAIN AND VENTRICLES: Global cortical atrophy. Subcortical and periventricular small vessel ischemic changes. Mild intracranial atherosclerosis. No acute intracranial hemorrhage. No mass effect or midline shift. No extra-axial fluid collection. No evidence of acute infarct. No hydrocephalus. ORBITS: No acute abnormality. SINUSES AND MASTOIDS: No acute abnormality. SOFT TISSUES AND SKULL: No acute skull fracture. No acute soft tissue abnormality. IMPRESSION: 1. No acute intracranial abnormality. Electronically signed by: Pinkie Pebbles MD 03/29/2024  10:20 PM EST RP Workstation: [id number]   CUP PACEART REMOTE DEVICE CHECK Result Date: 03/26/2024 PPM Scheduled remote reviewed. Normal device function.  Presenting rhythm: AP-VS.  Battery at RRT as of 03/14/24, known to clinic, generator change scheduled for 04/19/24. Next remote transmission per protocol. - CS, CVRS   Alm Schneider, DO  [facility]  If 7PM-7AM, please contact night-coverage [email or web address] Password [email or web address] 03/30/2024, 8:06 AM   LOS: 0 days

## 2024-03-30 NOTE — ED Notes (Signed)
 Spoke with pt's daughter Al who states pt is on hospice, has a DNR in place. States her brother Melena Hayes usually accompanies pt to appointments & her brother Danniella Robben comes to the home daily to help pt. States hospice also comes to pt's home daily to help as well.

## 2024-03-31 ENCOUNTER — Other Ambulatory Visit: Payer: Self-pay | Admitting: Family

## 2024-03-31 DIAGNOSIS — A419 Sepsis, unspecified organism: Secondary | ICD-10-CM | POA: Diagnosis not present

## 2024-03-31 DIAGNOSIS — N1832 Chronic kidney disease, stage 3b: Secondary | ICD-10-CM

## 2024-03-31 DIAGNOSIS — N179 Acute kidney failure, unspecified: Secondary | ICD-10-CM | POA: Insufficient documentation

## 2024-03-31 DIAGNOSIS — N390 Urinary tract infection, site not specified: Secondary | ICD-10-CM | POA: Diagnosis not present

## 2024-03-31 DIAGNOSIS — Z515 Encounter for palliative care: Secondary | ICD-10-CM | POA: Diagnosis not present

## 2024-03-31 DIAGNOSIS — N178 Other acute kidney failure: Secondary | ICD-10-CM | POA: Diagnosis not present

## 2024-03-31 DIAGNOSIS — G9341 Metabolic encephalopathy: Secondary | ICD-10-CM

## 2024-03-31 DIAGNOSIS — N3001 Acute cystitis with hematuria: Secondary | ICD-10-CM

## 2024-03-31 LAB — BASIC METABOLIC PANEL WITH GFR
Anion gap: 12 (ref 5–15)
BUN: 21 mg/dL (ref 8–23)
CO2: 20 mmol/L — ABNORMAL LOW (ref 22–32)
Calcium: 9.3 mg/dL (ref 8.9–10.3)
Chloride: 111 mmol/L (ref 98–111)
Creatinine, Ser: 1.56 mg/dL — ABNORMAL HIGH (ref 0.44–1.00)
GFR, Estimated: 33 mL/min — ABNORMAL LOW (ref >60)
Glucose, Bld: 134 mg/dL — ABNORMAL HIGH (ref 70–99)
Potassium: 3.5 mmol/L (ref 3.5–5.1)
Sodium: 143 mmol/L (ref 135–145)

## 2024-03-31 LAB — MAGNESIUM: Magnesium: 1.6 mg/dL — ABNORMAL LOW (ref 1.7–2.4)

## 2024-03-31 MED ORDER — OLANZAPINE 5 MG PO TBDP: 2.5000 mg | ORAL_TABLET | Freq: Every day | ORAL | Status: DC

## 2024-03-31 MED ORDER — POTASSIUM CHLORIDE 20 MEQ PO PACK
20.0000 meq | PACK | Freq: Once | ORAL | Status: AC
  Administered 2024-03-31: 20 meq via ORAL
  Filled 2024-03-31: qty 1

## 2024-03-31 MED ORDER — OLANZAPINE 5 MG PO TBDP
2.5000 mg | ORAL_TABLET | Freq: Every day | ORAL | Status: DC
  Administered 2024-03-31 – 2024-04-01 (×2): 2.5 mg via ORAL
  Filled 2024-03-31 (×3): qty 0.5

## 2024-03-31 MED ORDER — MAGNESIUM SULFATE 2 GM/50ML IV SOLN
2.0000 g | Freq: Once | INTRAVENOUS | Status: AC
  Administered 2024-03-31: 2 g via INTRAVENOUS
  Filled 2024-03-31: qty 50

## 2024-03-31 NOTE — TOC Progression Note (Addendum)
 Transition of Care [facility]) - Progression Note    Patient Details  Name: Lorraine Leonard MRN: [id number] Date of Birth: Aug 20, 1940  Transition of [facility]) CM/SW Contact  Noreen KATHEE [place], [place] Phone Number: 03/31/2024, 11:50 AM  Clinical Narrative:     CSW spoke with patient at bedside to get information. Patient was able to report that she lives alone, has two sons that live close by and that her daughter lives in [place]. Patient gave CSW permission to call her son [name]. CSW did speak with [name] about PT recommendation for SNF and expressed patient thoughts about it. Mark agreeable to SNF and gave facility options and asked CSW to call his sister Al. CSW spoke with Al and she too agreed with SNF and confirmed that Hospice is not active and they DC patient , but they were helping in the beginning with placement. Referral sent to [facility] and [facility]. ICM will continue to follow.    Addendum 3:22 pm   CSW went over bed offers and star ratings with Daughter. She choice [facility]. Auth started.   Expected Discharge Plan: Skilled Nursing Facility Barriers to Discharge: Continued Medical Work up               Expected Discharge Plan and Services       Living arrangements for the past 2 months: Single Family Home                                       Social Drivers of Health (SDOH) Interventions SDOH Screenings   Food Insecurity: Patient Unable To Answer (03/30/2024)  Housing: Patient Unable To Answer (03/30/2024)  Transportation Needs: Patient Unable To Answer (03/30/2024)  Recent Concern: Transportation Needs - Unmet Transportation Needs (02/17/2024)  Utilities: Patient Unable To Answer (03/30/2024)  Alcohol  Screen: Low Risk (02/17/2024)  Depression (PHQ2-9): Low Risk (02/17/2024)  Recent Concern: Depression (PHQ2-9) - High Risk (01/26/2024)  Financial Resource Strain: Low Risk (02/17/2024)  Physical Activity: Inactive (02/17/2024)  Social Connections:  Patient Unable To Answer (03/30/2024)  Recent Concern: Social Connections - Socially Isolated (02/17/2024)  Stress: No Stress Concern Present (02/17/2024)  Recent Concern: Stress - Stress Concern Present (12/29/2023)  Tobacco Use: Low Risk (03/29/2024)  Health Literacy: Adequate Health Literacy (02/17/2024)    Readmission Risk Interventions    03/31/2024   11:50 AM 01/29/2024    2:55 PM  Readmission Risk Prevention Plan  Transportation Screening Complete Complete  Home Care Screening  Complete  Medication Review (RN CM)  Complete  Medication Review (RN Care Manager) Complete   HRI or Home Care Consult Complete   SW Recovery Care/Counseling Consult Complete   Palliative Care Screening Complete   Skilled Nursing Facility Complete

## 2024-03-31 NOTE — Plan of Care (Signed)
°  Problem: Acute Rehab PT Goals(only PT should resolve) Goal: Pt Will Go Supine/Side To Sit Outcome: Progressing Flowsheets (Taken 03/31/2024 1121) Pt will go Supine/Side to Sit: with supervision Goal: Patient Will Transfer Sit To/From Stand Outcome: Progressing Flowsheets (Taken 03/31/2024 1121) Patient will transfer sit to/from stand: with supervision Goal: Pt Will Transfer Bed To Chair/Chair To Bed Outcome: Progressing Flowsheets (Taken 03/31/2024 1121) Pt will Transfer Bed to Chair/Chair to Bed: with supervision Goal: Pt Will Ambulate Outcome: Progressing Flowsheets (Taken 03/31/2024 1121) Pt will Ambulate:  50 feet  with rolling walker  with supervision   11:21 AM, 03/31/2024 Rosaria Settler, PT, DPT [place] with [place]

## 2024-03-31 NOTE — Progress Notes (Signed)
 Mobility Specialist Progress Note:    03/31/24 1002  Mobility  Activity Pivoted/transferred from chair to bed  Level of Assistance Maximum assist, patient does 25-49%  Assistive Device None  Distance Ambulated (ft) 2 ft  Range of Motion/Exercises Active;All extremities  Activity Response Tolerated well  Mobility Referral Yes  Mobility visit 1 Mobility  Mobility Specialist Start Time (ACUTE ONLY) 1002  Mobility Specialist Stop Time (ACUTE ONLY) 1022  Mobility Specialist Time Calculation (min) (ACUTE ONLY) 20 min   NT and nurse requesting assistance to help pt off of floor. Required Max A to stand and pivot from floor to chair with no AD. Tolerated well, BLE weakness and slight confusion. Max A to stand and pivot from chair to bed. Left supine, NT and nurse in room. All needs met.  Roslynn Holte Mobility Specialist Please contact via Special Educational Needs Teacher or  Rehab office at [phone]

## 2024-03-31 NOTE — Progress Notes (Signed)
 Daily Progress Note   Patient Name: Lorraine Leonard       Date: 03/31/2024 DOB: Oct 15, 1940  Age: 83 y.o. MRN#: [id number] Attending Physician: Evonnie Lenis, MD Primary Care Physician: Lavell Bari LABOR, FNP Admit Date: 03/29/2024  Reason for Consultation/Follow-up: Establishing goals of care  Patient Profile/HPI:   83 y.o. female  with past medical history of a-fib, HTN, HLD, HFpEF, dementia, CKD 3b, solitary kidney, pulmonary fibrosis- home oxygen  3L, admitted on 03/29/2024 with UTI with metabolic encephalopathy. Patient was on  hospice prior to admission. Palliative medicine consulted for GOC discussion.   Subjective: Chart reviewed including labs, progress notes, imaging from this and previous encounters.  Urine culture with >100,000 gram neg rods. Cr improving today.  Per SW note patient was able to wake and have appropriate discussion with her.  On my eval she was sleeping- did not wake to voice or touch.  I called and spoke with Torry.  Torry spoke with her brother and they believe that if patient had decline in the future then she would not want repeat hospitalization- would prefer comfort measures only.  I recommended referral for Palliative support at next venue (likely SNF) and then transition to hospice if/when declines. Cherise is in agreement with this.  Torry also reported that patient had rough night- she was crying and confused. Discussed use of zyprexa for sundowning and she is in agreement.    Review of Systems  Unable to perform ROS: Other     Physical Exam Vitals and nursing note reviewed.  Constitutional:      General: She is not in acute distress.    Appearance: She is normal weight.  Cardiovascular:     Rate and Rhythm: Normal rate.  Pulmonary:     Effort: Pulmonary  effort is normal.  Neurological:     Comments: sleeping             Vital Signs: BP (!) 108/45 (BP Location: Left Arm)   Pulse 62   Temp 97.7 F (36.5 C) (Oral)   Resp 17   Ht 5' 7 (1.702 m)   Wt 74.6 kg   SpO2 98%   BMI 25.76 kg/m  SpO2: SpO2: 98 % O2 Device: O2 Device: Room Air O2 Flow Rate:    Intake/output summary:  Intake/Output Summary (Last 24 hours)  at 03/31/2024 1320 Last data filed at 03/31/2024 0300 Gross per 24 hour  Intake 936.91 ml  Output --  Net 936.91 ml   LBM: Last BM Date : 03/31/24 Baseline Weight: Weight: 80 kg Most recent weight: Weight: 74.6 kg       Palliative Assessment/Data: PPS: 50%      Patient Active Problem List   Diagnosis Date Noted   Acute renal failure superimposed on stage 3b chronic kidney disease (HCC) 03/31/2024   Acute metabolic encephalopathy 03/30/2024   Chronic heart failure with preserved ejection fraction (HFpEF) (HCC) 03/30/2024   UTI (urinary tract infection) 03/29/2024   Candidal intertrigo 01/29/2024   Complete heart block (HCC) 01/29/2024   Acute kidney injury 01/28/2024   Cystitis 01/28/2024   Hypercalcemia 01/28/2024   History of COPD 01/28/2024   Wound of left leg 11/24/2023   Left leg cellulitis 11/24/2023   Recurrent UTI 10/06/2023   Atrophic vaginitis 10/06/2023   Immunosuppression due to drug therapy 10/06/2023   Ambulatory dysfunction 10/06/2023   Dependent for transportation 10/06/2023   Epigastric pain 03/27/2021   Gastric nodule 03/27/2021   Iron  deficiency anemia due to chronic blood loss 03/27/2021   History of colonic polyps 03/27/2021   Intractable back pain 07/08/2020   CKD (chronic kidney disease), stage 3b (HCC) 07/08/2020   Aorto-iliac atherosclerosis 07/08/2020   Chronic diastolic heart failure (HCC) 07/08/2020   Asthmatic bronchitis , chronic (HCC) 03/19/2020   Idiopathic urticaria 10/19/2018   Osteoarthritis 07/22/2018   Chronic respiratory failure  with hypoxia (HCC) 03/26/2017   COPD (chronic obstructive pulmonary disease) (HCC) 03/26/2017   Permanent atrial fibrillation (HCC) 02/09/2017   Acute diastolic CHF (congestive heart failure) (HCC) 11/11/2016   Pulmonary fibrosis (HCC) 09/23/2016   Type 2 diabetes mellitus (HCC) 07/17/2016   Obesity (BMI 30-39.9) 04/18/2016   Snoring 11/09/2015   Pulmonary hypertension (HCC) 11/09/2015   Gout 10/19/2015   Chronic urticaria 03/24/2015   Metabolic syndrome 03/01/2015   Solitary kidney, acquired 03/01/2015   Macular degeneration of both eyes 11/28/2014   Constipation 11/28/2014   Chronic rhinitis 11/17/2014   Hypertensive retinopathy 10/30/2014   Vitamin D  deficiency 08/17/2014   Arthritis of knee 08/17/2014   Hyperlipidemia associated with type 2 diabetes mellitus (HCC) 08/17/2014   Depression    Acquired hypothyroidism    Anxiety    Cardiomegaly 05/05/2011   Essential hypertension 02/08/2007   PULMONARY NODULE 02/08/2007   GASTROESOPHAGEAL REFLUX DISEASE 02/08/2007    Palliative Care Assessment & Plan    Assessment/Recommendations/Plan  UTI- continue to treat what is treatable, d/c to SNF with Palliative Transition to hospice/comfort if/when patient has repeat decline, comfort only if returns to hospital Sundowning- olanzapine 2.5mg  every evening   Code Status:   Code Status: Limited: Do not attempt resuscitation (DNR) -DNR-LIMITED -Do Not Intubate/DNI    Prognosis:  Unable to determine  Discharge Planning: Skilled Nursing Facility for rehab with Palliative care service follow-up  Care plan was discussed with patient's daughter  Thank you for allowing the Palliative Medicine Team to assist in the care of this patient.  Total time: 60 minutes Prolonged billing:  Time includes:   Preparing to see the patient (e.g., review of tests) Obtaining and/or reviewing separately obtained history Performing a medically necessary appropriate  examination and/or evaluation Counseling and educating the patient/family/caregiver Ordering medications, tests, or procedures Referring and communicating with other health care professionals (when not reported separately) Documenting clinical information in the electronic or other health record Independently interpreting results (not reported separately)  and communicating results to the patient/family/caregiver Care coordination (not reported separately) Clinical documentation  Cassondra Stain, AGNP-C Palliative Medicine   Please contact Palliative Medicine Team phone at [phone] for questions and concerns.

## 2024-03-31 NOTE — Evaluation (Signed)
 Physical Therapy Evaluation Patient Details Name: Lorraine Leonard MRN: [id number] DOB: Mar 17, 1941 Today's Date: 03/31/2024  History of Present Illness  Lorraine Leonard is a 83 y.o. female with medical history significant for paroxysmal A-fib on Eliquis , hypertension, hyperlipidemia, chronic HFpEF, COPD on oxygen  3L [place], hypothyroidism, who presented to the ER after a fall at home this morning.  Is unclear how she fell.  The patient is unable to provide further details.  Per family member, her son Lorraine Leonard, via phone, the patient is in hospice care and is a DNR/DNI.  The patient was found at home lethargic.   Clinical Impression  Patient agreeable to OT/PT co-evaluation. Patient demonstrates some altered cognition during session but provides some history. No family or friends present to confirm. Pt reports at baseline, she is now using AD for ambulation in home but independent with ADLs. This date, patient is CGA/min A for bed mobility, transfers, and ambulation with RW. Pt does require consistent cueing during session due to confusion. Patient tolerates sitting in chair at end of session, chair alarm set, call button in reach, floor mat in place, and all needs met/in reach. Patient will benefit from continued skilled physical therapy acutely and in recommended venue in order to address current deficits and improve overall function.        If plan is discharge home, recommend the following: A little help with walking and/or transfers;A lot of help with bathing/dressing/bathroom;Supervision due to cognitive status   Can travel by private vehicle        Equipment Recommendations None recommended by PT  Recommendations for Other Services       Functional Status Assessment Patient has had a recent decline in their functional status and demonstrates the ability to make significant improvements in function in a reasonable and predictable amount of time.     Precautions / Restrictions  Precautions Precautions: Fall Recall of Precautions/Restrictions: Impaired Restrictions Weight Bearing Restrictions Per Provider Order: No      Mobility  Bed Mobility Overal bed mobility: Needs Assistance Bed Mobility: Supine to Sit     Supine to sit: Contact guard, Min assist     General bed mobility comments: Inc time required due to labored movement, HOB flat, no use of railings, mild assist at trunk, and verbal cueing throughout    Transfers Overall transfer level: Needs assistance Equipment used: Rolling walker (2 wheels) Transfers: Sit to/from Stand, Bed to chair/wheelchair/BSC Sit to Stand: Min assist   Step pivot transfers: Min assist       General transfer comment: slight min assist to stand from EOB due to LE weakness and w/o AD, bed to chair without AD w/ pt using arm rests of chair for support and is unsteady, used RW with next stand from chair with min A    Ambulation/Gait Ambulation/Gait assistance: Contact guard assist, Min assist Gait Distance (Feet): 20 Feet Assistive device: Rolling walker (2 wheels) Gait Pattern/deviations: Step-through pattern, Decreased step length - right, Decreased step length - left, Decreased stride length, Trunk flexed Gait velocity: Dec     General Gait Details: pt ambulates in room with RW and CGA w/ the above deviations, generally unsteady on feet, required verbal cueing throughout  Stairs            Wheelchair Mobility     Tilt Bed    Modified Rankin (Stroke Patients Only)       Balance Overall balance assessment: Needs assistance Sitting-balance support: No upper extremity supported, Feet supported Sitting  balance-Leahy Scale: Fair Sitting balance - Comments: seated EOB   Standing balance support: During functional activity, Bilateral upper extremity supported, Reliant on assistive device for balance Standing balance-Leahy Scale: Fair Standing balance comment: using RW                              Pertinent Vitals/Pain Pain Assessment Pain Assessment: Faces Faces Pain Scale: Hurts a little bit Pain Location: stomach Pain Descriptors / Indicators: Other (Comment) (pt reports feeling sick on her stomach at start of session) Pain Intervention(s): Limited activity within patient's tolerance, Monitored during session, Repositioned    Home Living Family/patient expects to be discharged to:: Private residence Living Arrangements: Children Available Help at Discharge: Family;Available PRN/intermittently Type of Home: Mobile home Home Access: Ramped entrance       Home Layout: One level Home Equipment: Rolling Walker (2 wheels);BSC/3in1;Shower seat;Grab bars - tub/shower;Rollator (4 wheels) Additional Comments: Per chart and pt report. pt appears slightly confused throughout session. No family present to confirm.    Prior Function Prior Level of Function : Needs assist;Patient poor historian/Family not available;History of Falls (last six months)       Physical Assist : ADLs (physical);Mobility (physical) Mobility (physical): Transfers;Gait;Stairs ADLs (physical): Dressing;Bathing;IADLs Mobility Comments: Pt reports household ambulation with RW. ADLs Comments: Pt reports independence with ADL's, but chart indicates assist. Pt reports assist for IADL's.     Extremity/Trunk Assessment   Upper Extremity Assessment Upper Extremity Assessment: Defer to OT evaluation    Lower Extremity Assessment Lower Extremity Assessment: Generalized weakness (ankle DF 4/5 bilat, hip flexion 4-/5 bilat)    Cervical / Trunk Assessment Cervical / Trunk Assessment: Normal  Communication   Communication Communication: No apparent difficulties    Cognition Arousal: Alert Behavior During Therapy: WFL for tasks assessed/performed   PT - Cognitive impairments: No family/caregiver present to determine baseline                       PT - Cognition Comments: Pt alert to self,  and time (year, not month). Disoriented to place, and situation Following commands: Intact       Cueing Cueing Techniques: Verbal cues, Tactile cues, Gestural cues, Visual cues     General Comments      Exercises     Assessment/Plan    PT Assessment Patient needs continued PT services;All further PT needs can be met in the next venue of care  PT Problem List Decreased strength;Decreased activity tolerance;Decreased balance;Decreased mobility;Decreased cognition;Decreased safety awareness       PT Treatment Interventions DME instruction;Gait training;Stair training;Functional mobility training;Therapeutic activities;Therapeutic exercise;Balance training    PT Goals (Current goals can be found in the Care Plan section)  Acute Rehab PT Goals Patient Stated Goal: Return home PT Goal Formulation: With patient Time For Goal Achievement: 04/14/24 Potential to Achieve Goals: Good    Frequency Min 3X/week     Co-evaluation PT/OT/SLP Co-Evaluation/Treatment: Yes Reason for Co-Treatment: To address functional/ADL transfers PT goals addressed during session: Mobility/safety with mobility OT goals addressed during session: ADL's and self-care       AM-PAC PT 6 Clicks Mobility  Outcome Measure Help needed turning from your back to your side while in a flat bed without using bedrails?: None Help needed moving from lying on your back to sitting on the side of a flat bed without using bedrails?: None Help needed moving to and from a bed to a chair (including  a wheelchair)?: A Little Help needed standing up from a chair using your arms (e.g., wheelchair or bedside chair)?: A Little Help needed to walk in hospital room?: A Little Help needed climbing 3-5 steps with a railing? : A Lot 6 Click Score: 19    End of Session Equipment Utilized During Treatment: Gait belt Activity Tolerance: Patient tolerated treatment well Patient left: in chair;with call bell/phone within reach;with  chair alarm set;Other (comment) (Floor mat in place)   PT Visit Diagnosis: Unsteadiness on feet (R26.81);Other abnormalities of gait and mobility (R26.89);Muscle weakness (generalized) (M62.81);History of falling (Z91.81);Difficulty in walking, not elsewhere classified (R26.2)    Time: 9153-9096 PT Time Calculation (min) (ACUTE ONLY): 17 min   Charges:   PT Evaluation $PT Eval Low Complexity: 1 Low   PT General Charges $$ ACUTE PT VISIT: 1 Visit         11:20 AM, 03/31/2024 Rosaria Settler, PT, DPT [place] with [facility]

## 2024-03-31 NOTE — Evaluation (Signed)
 Occupational Therapy Evaluation Patient Details Name: Lorraine Leonard MRN: [id number] DOB: [birth year] Today's Date: 03/31/2024   History of Present Illness   Lorraine Leonard is a 83 y.o. female with medical history significant for paroxysmal A-fib on Eliquis , hypertension, hyperlipidemia, chronic HFpEF, COPD on oxygen  3L [place], hypothyroidism, who presented to the ER after a fall at home this morning.  Is unclear how she fell.  The patient is unable to provide further details.  Per family member, her son Lorraine Leonard, via phone, the patient is in hospice care and is a DNR/DNI.  The patient was found at home lethargic. (per DO)     Clinical Impressions Pt agreeable to OT and PT co-evaluation. Pt not oriented to place today, but able to follow commands and discuss prior living history. No family present to confirm information. Pt required CGA to min A for bed mobility. CGA to min A for transfers with RW. Set up assist for seated ADL's based on observation and clinical judgement. Pt left in the chair with call bell within reach and chair alarm set. Pt will benefit from continued OT in the hospital to increase strength, balance, and endurance for safe ADL's.        If plan is discharge home, recommend the following:   A little help with walking and/or transfers;A little help with bathing/dressing/bathroom;Assistance with cooking/housework;Assist for transportation;Direct supervision/assist for medications management;Help with stairs or ramp for entrance     Functional Status Assessment   Patient has had a recent decline in their functional status and demonstrates the ability to make significant improvements in function in a reasonable and predictable amount of time.     Equipment Recommendations   None recommended by OT             Precautions/Restrictions   Precautions Precautions: Fall Recall of Precautions/Restrictions: Impaired Restrictions Weight Bearing Restrictions Per Provider  Order: No     Mobility Bed Mobility Overal bed mobility: Needs Assistance Bed Mobility: Supine to Sit     Supine to sit: Contact guard, Min assist     General bed mobility comments: Extended time, labored effort.    Transfers Overall transfer level: Needs assistance Equipment used: Rolling walker (2 wheels) Transfers: Sit to/from Stand, Bed to chair/wheelchair/BSC Sit to Stand: Min assist     Step pivot transfers: Contact guard assist, Min assist     General transfer comment: Min A to boost from EOB and chair. Initial step pivot to chair completed without AD and CGA to min A.      Balance Overall balance assessment: Needs assistance Sitting-balance support: No upper extremity supported, Feet supported Sitting balance-Leahy Scale: Fair Sitting balance - Comments: fair to good seated at EOB   Standing balance support: During functional activity, Bilateral upper extremity supported, Reliant on assistive device for balance Standing balance-Leahy Scale: Fair Standing balance comment: using RW                           ADL either performed or assessed with clinical judgement   ADL Overall ADL's : Needs assistance/impaired     Grooming: Set up;Sitting   Upper Body Bathing: Set up;Sitting   Lower Body Bathing: Set up;Sitting/lateral leans   Upper Body Dressing : Set up;Sitting   Lower Body Dressing: Set up;Sitting/lateral leans   Toilet Transfer: Contact guard assist;Minimal assistance;Rolling walker (2 wheels);Ambulation;Stand-pivot Toilet Transfer Details (indicate cue type and reason): EOB to chair and ambulation in the room with  RW. Toileting- Clothing Manipulation and Hygiene: Set up;Sitting/lateral lean;Minimal assistance;Sit to/from stand       Functional mobility during ADLs: Contact guard assist;Minimal assistance;Rolling walker (2 wheels) General ADL Comments: Able to ambualte a short distance in the room with RW.     Vision Baseline  Vision/History: 1 Wears glasses Ability to See in Adequate Light: 1 Impaired Patient Visual Report: No change from baseline Vision Assessment?: No apparent visual deficits     Perception Perception: Not tested       Praxis Praxis: Not tested       Pertinent Vitals/Pain Pain Assessment Pain Assessment: Faces Faces Pain Scale: Hurts a little bit Pain Location: stomach Pain Descriptors / Indicators: Other (Comment) (sick on somach) Pain Intervention(s): Monitored during session     Extremity/Trunk Assessment Upper Extremity Assessment Upper Extremity Assessment: Generalized weakness   Lower Extremity Assessment Lower Extremity Assessment: Defer to PT evaluation   Cervical / Trunk Assessment Cervical / Trunk Assessment: Normal   Communication Communication Communication: No apparent difficulties   Cognition Arousal: Alert Behavior During Therapy: WFL for tasks assessed/performed Cognition: No family/caregiver present to determine baseline, Cognition impaired   Orientation impairments: Place, Situation         OT - Cognition Comments: Able to follow commands but did not know she was in the hospital.                 Following commands: Intact       Cueing  General Comments   Cueing Techniques: Verbal cues;Tactile cues;Gestural cues                 Home Living Family/patient expects to be discharged to:: Private residence Living Arrangements: Children Available Help at Discharge: Family;Available PRN/intermittently Type of Home: Mobile home Home Access: Ramped entrance     Home Layout: One level     Bathroom Shower/Tub: Producer, Television/film/video: Handicapped height Bathroom Accessibility: Yes How Accessible: Accessible via walker Home Equipment: Rolling Walker (2 wheels);BSC/3in1;Shower seat;Grab bars - tub/shower;Rollator (4 wheels)   Additional Comments: Per chart and pt report.      Prior Functioning/Environment Prior Level  of Function : Needs assist;Patient poor historian/Family not available       Physical Assist : ADLs (physical);Mobility (physical) Mobility (physical): Transfers;Gait;Stairs ADLs (physical): Dressing;Bathing;IADLs Mobility Comments: Pt reports household ambulation with RW. ADLs Comments: Pt reports independence with ADL's, but chart indicates assist. Pt reports assist for IADL's.    OT Problem List: Decreased strength;Decreased activity tolerance;Impaired balance (sitting and/or standing);Decreased cognition   OT Treatment/Interventions: Self-care/ADL training;Therapeutic exercise;Therapeutic activities;Cognitive remediation/compensation;Patient/family education;Balance training      OT Goals(Current goals can be found in the care plan section)   Acute Rehab OT Goals Patient Stated Goal: Return home. OT Goal Formulation: With patient Time For Goal Achievement: 04/14/24 Potential to Achieve Goals: Good   OT Frequency:  Min 2X/week    Co-evaluation PT/OT/SLP Co-Evaluation/Treatment: Yes Reason for Co-Treatment: To address functional/ADL transfers   OT goals addressed during session: ADL's and self-care                       End of Session Equipment Utilized During Treatment: Rolling walker (2 wheels);Gait belt  Activity Tolerance: Patient tolerated treatment well Patient left: in chair;with call bell/phone within reach;with chair alarm set  OT Visit Diagnosis: Unsteadiness on feet (R26.81);Other abnormalities of gait and mobility (R26.89);Muscle weakness (generalized) (M62.81);Other symptoms and signs involving cognitive function  Time: 9153-9095 OT Time Calculation (min): 18 min Charges:  OT General Charges $OT Visit: 1 Visit OT Evaluation $OT Eval Low Complexity: 1 Low  Mauricia Mertens OT, MOT  Jayson Person 03/31/2024, 9:46 AM

## 2024-03-31 NOTE — TOC CM/SW Note (Addendum)
 MUST ID: [id number]  DOB: 1940-08-21  30 Day Note:  To whom it May Concern:  Please be advised that the above name patient will require a short-term nursing home stay- anticipated 30 days or less rehabilitation and strengthening. The plan is for return home.

## 2024-03-31 NOTE — Progress Notes (Addendum)
 PROGRESS NOTE  HAJAR PENNINGER FMW:[id number] DOB: 1940-08-16 DOA: 03/29/2024 PCP: Lavell Bari LABOR, FNP  Brief History:  83 year old female with a history of pulmonary fibrosis, hypothyroidism, paroxysmal atrial fibrillation status post ablation and PPM, HFpEF, hypertension, hyperlipidemia, and CKD stage IIIb, and chronic respiratory failure on 3 L presenting after being found lethargic by her son.  Per family member, her son [name], via phone, the patient is in hospice care and is a DNR/DNI.  The patient was found at home lethargic.  In the ED, the patient was afebrile with soft blood pressures in the upper 90s and low 100s.  She was hemodynamically stable.  Oxygen  saturation was 98% room air.  WBC 7.1, hemoglobin 12.5, platelets 234.  Sodium 138, 3.7, bicarbonate 20, serum creatinine 1.91.  LFTs were unremarkable.  Lactic acid 2.2>> 1.5.  UA > 50 WBC.  COVID-19 PCR was negative.  CT brain and CT cervical spine were negative for any acute findings.  X-ray of the left pelvis was negative.  Chest x-ray was negative.  The patient was started on IV fluids and ceftriaxone .     Assessment/Plan: Acute metabolic encephalopathy - Secondary to UTI - Check VBG--7.29/42/<31/20 - B12--654 -folate--13.1 - TSH--4.730 - CK 351 - more alert and conversant   UTI--GNR -UA>50 WBC Continue ceftriaxone  pending culture data-  Acute on chronic renal failure--CKD 3B -baseline creatinine 1.1-1.4 -presented with serum creatinine 1.91 -due to volume depletion -continue IVF>>improving   Paroxysmal atrial fibrillation - Status post AV node ablation and PPM - Continue apixaban  - Holding atenolol  secondary to soft blood pressure   Hypothyroidism - Continue Synthroid    GERD - Continue pantoprazole    Goals of care - DNR/DNI confirmed with family   Chronic HFpEF - Clinically compensated - 07/23/2022 echo EF 65 to 70%, no WMA, normal RVF - On furosemide  every 48 hours at home--holding  temporarily 12/11 VS at 1115--HR 87--RR16-106/63--99%RA   COPD - Stable without exacerbation - No respiratory distress  Hypomagnesemia -replete             Family Communication: daughter updated 12/11   Consultants:  none   Code Status:  DNR   DVT Prophylaxis:  apixaban      Procedures: As Listed in Progress Note Above   Antibiotics: Ceftriaxone  12/9>>         Subjective: Pt is pleasantly confused.  Denies cp, sob, abd pain.  Remainder ROS unobtainable due to dementia  Objective: Vitals:   03/30/24 1730 03/30/24 1932 03/31/24 0357 03/31/24 1000  BP: (!) 142/74 (!) 106/39 (!) 113/52 (!) 85/70  Pulse: 60 71 80 67  Resp: 20 18 18 17   Temp: 98.3 F (36.8 C) 97.8 F (36.6 C) 98 F (36.7 C) 97.6 F (36.4 C)  TempSrc: Axillary Oral Oral Oral  SpO2: 100% 97% 98% 97%  Weight:      Height:        Intake/Output Summary (Last 24 hours) at 03/31/2024 1108 Last data filed at 03/31/2024 0300 Gross per 24 hour  Intake 936.91 ml  Output --  Net 936.91 ml   Weight change:  Exam:  General:  Pt is alert, follows commands appropriately, not in acute distress HEENT: No icterus, No thrush, No neck mass, [place]/AT Cardiovascular: RRR, S1/S2, no rubs, no gallops Respiratory: CTA bilaterally, no wheezing, no crackles, no rhonchi Abdomen: Soft/+BS, non tender, non distended, no guarding Extremities: No edema, No lymphangitis, No petechiae, No rashes, no synovitis   Data  Reviewed: I have personally reviewed following labs and imaging studies Basic Metabolic Panel: Recent Labs  Lab 03/29/24 2031 03/30/24 0411 03/31/24 0612  NA 138 140 143  K 3.7 3.6 3.5  CL 105 111 111  CO2 20* 16* 20*  GLUCOSE 148* 123* 134*  BUN 26* 23 21  CREATININE 1.91* 1.64* 1.56*  CALCIUM  9.9 8.6* 9.3  MG  --  1.9 1.6*  PHOS  --  2.8  --    Liver Function Tests: Recent Labs  Lab 03/29/24 2031 03/30/24 0411  AST 46* 41  ALT 14 12  ALKPHOS 79 72  BILITOT 0.7 0.5  PROT 6.8 5.8*   ALBUMIN 3.6 3.2*   No results for input(s): LIPASE, AMYLASE in the last 168 hours. No results for input(s): AMMONIA in the last 168 hours. Coagulation Profile: Recent Labs  Lab 03/29/24 2031  INR 2.4*   CBC: Recent Labs  Lab 03/29/24 2105 03/30/24 0411  WBC 7.1 7.0  NEUTROABS  --  3.5  HGB 12.5 10.9*  HCT 38.2 34.8*  MCV 94.1 98.0  PLT 234 231   Cardiac Enzymes: Recent Labs  Lab 03/30/24 0411  CKTOTAL 351*   BNP: Invalid input(s): POCBNP CBG: No results for input(s): GLUCAP in the last 168 hours. HbA1C: No results for input(s): HGBA1C in the last 72 hours. Urine analysis:    Component Value Date/Time   COLORURINE YELLOW 03/29/2024 2045   APPEARANCEUR TURBID (A) 03/29/2024 2045   APPEARANCEUR Clear 01/26/2024 1545   LABSPEC 1.015 03/29/2024 2045   PHURINE 5.0 03/29/2024 2045   GLUCOSEU NEGATIVE 03/29/2024 2045   HGBUR LARGE (A) 03/29/2024 2045   BILIRUBINUR NEGATIVE 03/29/2024 2045   BILIRUBINUR Negative 01/26/2024 1545   KETONESUR NEGATIVE 03/29/2024 2045   PROTEINUR 100 (A) 03/29/2024 2045   UROBILINOGEN negative 04/06/2015 1517   UROBILINOGEN 0.2 05/14/2011 1942   NITRITE NEGATIVE 03/29/2024 2045   LEUKOCYTESUR MODERATE (A) 03/29/2024 2045   Sepsis Labs: @LABRCNTIP (procalcitonin:4,lacticidven:4) ) Recent Results (from the past 240 hours)  Urine Culture     Status: Abnormal (Preliminary result)   Collection Time: 03/29/24  8:45 PM   Specimen: Urine, Random  Result Value Ref Range Status   Specimen Description   Final    URINE, RANDOM Performed at [facility], [address], [place], [place] [address]    Special Requests   Final    NONE Reflexed from [id number] Performed at [facility], [address] [place], [place] [address]    Culture >=100,000 COLONIES/mL GRAM NEGATIVE RODS (A)  Final   Report Status PENDING  Incomplete  Blood Culture (routine x 2)     Status: None (Preliminary result)   Collection Time: 03/29/24  9:29 PM    Specimen: BLOOD  Result Value Ref Range Status   Specimen Description BLOOD BLOOD LEFT ARM  Final   Special Requests   Final    BOTTLES DRAWN AEROBIC AND ANAEROBIC Blood Culture adequate volume   Culture   Final    NO GROWTH 2 DAYS Performed at [facility], [address] [place], [place] [address]    Report Status PENDING  Incomplete  Blood Culture (routine x 2)     Status: None (Preliminary result)   Collection Time: 03/29/24  9:30 PM   Specimen: BLOOD  Result Value Ref Range Status   Specimen Description BLOOD BLOOD RIGHT WRIST  Final   Special Requests   Final    BOTTLES DRAWN AEROBIC AND ANAEROBIC Blood Culture results may not be optimal  due to an inadequate volume of blood received in culture bottles   Culture   Final    NO GROWTH 2 DAYS Performed at [facility], [address] [place], [place] [address]    Report Status PENDING  Incomplete  Resp panel by RT-PCR (RSV, Flu A&B, Covid) Anterior Nasal Swab     Status: None   Collection Time: 03/29/24  9:36 PM   Specimen: Anterior Nasal Swab  Result Value Ref Range Status   SARS Coronavirus 2 by RT PCR NEGATIVE NEGATIVE Final    Comment: (NOTE) SARS-CoV-2 target nucleic acids are NOT DETECTED.  The SARS-CoV-2 RNA is generally detectable in upper respiratory specimens during the acute phase of infection. The lowest concentration of SARS-CoV-2 viral copies this assay can detect is 138 copies/mL. A negative result does not preclude SARS-Cov-2 infection and should not be used as the sole basis for treatment or other patient management decisions. A negative result may occur with  improper specimen collection/handling, submission of specimen other than nasopharyngeal swab, presence of viral mutation(s) within the areas targeted by this assay, and inadequate number of viral copies(<138 copies/mL). A negative result must be combined with clinical observations, patient history, and epidemiological information. The expected result  is Negative.  Fact Sheet for Patients:  bloggercourse.com  Fact Sheet for Healthcare Providers:  seriousbroker.it  This test is no t yet approved or cleared by the [place]  FDA and  has been authorized for detection and/or diagnosis of SARS-CoV-2 by FDA under an Emergency Use Authorization (EUA). This EUA will remain  in effect (meaning this test can be used) for the duration of the COVID-19 declaration under Section 564(b)(1) of the Act, 21 U.S.C.section 360bbb-3(b)(1), unless the authorization is terminated  or revoked sooner.       Influenza A by PCR NEGATIVE NEGATIVE Final   Influenza B by PCR NEGATIVE NEGATIVE Final    Comment: (NOTE) The Xpert Xpress SARS-CoV-2/FLU/RSV plus assay is intended as an aid in the diagnosis of influenza from Nasopharyngeal swab specimens and should not be used as a sole basis for treatment. Nasal washings and aspirates are unacceptable for Xpert Xpress SARS-CoV-2/FLU/RSV testing.  Fact Sheet for Patients: bloggercourse.com  Fact Sheet for Healthcare Providers: seriousbroker.it  This test is not yet approved or cleared by the [place]  FDA and has been authorized for detection and/or diagnosis of SARS-CoV-2 by FDA under an Emergency Use Authorization (EUA). This EUA will remain in effect (meaning this test can be used) for the duration of the COVID-19 declaration under Section 564(b)(1) of the Act, 21 U.S.C. section 360bbb-3(b)(1), unless the authorization is terminated or revoked.     Resp Syncytial Virus by PCR NEGATIVE NEGATIVE Final    Comment: (NOTE) Fact Sheet for Patients: bloggercourse.com  Fact Sheet for Healthcare Providers: seriousbroker.it  This test is not yet approved or cleared by the [place]  FDA and has been authorized for detection and/or diagnosis of  SARS-CoV-2 by FDA under an Emergency Use Authorization (EUA). This EUA will remain in effect (meaning this test can be used) for the duration of the COVID-19 declaration under Section 564(b)(1) of the Act, 21 U.S.C. section 360bbb-3(b)(1), unless the authorization is terminated or revoked.  Performed at [facility], [address], [place], [place] [address]      Scheduled Meds:  apixaban   2.5 mg Oral BID   levothyroxine   100 mcg Oral Q0600   pantoprazole   40 mg Oral Daily   potassium chloride   20 mEq Oral Once  Continuous Infusions:  cefTRIAXone  (ROCEPHIN )  IV 2 g (03/30/24 2203)   lactated ringers  75 mL/hr at 03/31/24 9180   magnesium  sulfate bolus IVPB      Procedures/Studies: DG Hip Unilat W or Wo Pelvis 2-3 Views Left Result Date: 03/29/2024 CLINICAL DATA:  Fall with left-sided hip pain EXAM: DG HIP (WITH OR WITHOUT PELVIS) 2-3V LEFT COMPARISON:  CT 03/29/2024 FINDINGS: Hardware in the lumbo sacral spine. No definitive fracture or malalignment. IMPRESSION: No definite acute osseous abnormality. Electronically Signed   By: Luke Bun M.D.   On: 03/29/2024 22:42   DG Chest Port 1 View Result Date: 03/29/2024 CLINICAL DATA:  Possible sepsis EXAM: PORTABLE CHEST 1 VIEW COMPARISON:  Fall 05/27/2022, CT 03/29/2024, 07/19/2018 FINDINGS: Left-sided pacing device as before. Enlarged cardiomediastinal silhouette with aortic atherosclerosis. Prominent mediastinum, likely due to portable technique and patient rotation. Mild reticular and interstitial opacities, corresponding to scarring on CT. No acute confluent airspace disease. No pleural effusion or pneumothorax. IMPRESSION: No active disease. Cardiomegaly with chronic changes. Electronically Signed   By: Luke Bun M.D.   On: 03/29/2024 22:40   CT CHEST ABDOMEN PELVIS WO CONTRAST Result Date: 03/29/2024 EXAM: CT CHEST, ABDOMEN AND PELVIS WITHOUT CONTRAST 03/29/2024 10:18:20 PM TECHNIQUE: CT of the chest, abdomen and pelvis was  performed without the administration of intravenous contrast. Multiplanar reformatted images are provided for review. Automated exposure control, iterative reconstruction, and/or weight based adjustment of the mA/kV was utilized to reduce the radiation dose to as low as reasonably achievable. COMPARISON: CT abdomen/pelvis dated 02/04/2024 and CT chest dated 03/23/2018. CLINICAL HISTORY: Polytrauma, blunt. FINDINGS: CHEST: MEDIASTINUM AND LYMPH NODES: Mild cardiomegaly. Left subclavian pacemaker. The central airways are clear. No mediastinal, hilar or axillary lymphadenopathy. LUNGS AND PLEURA: Mild subpleural scarring in the lungs bilaterally. No focal consolidation or pulmonary edema. No pleural effusion or pneumothorax. ABDOMEN AND PELVIS: LIVER: The liver is unremarkable. GALLBLADDER AND BILE DUCTS: Gallbladder is unremarkable. No biliary ductal dilatation. SPLEEN: No acute abnormality. PANCREAS: No acute abnormality. ADRENAL GLANDS: No acute abnormality. KIDNEYS, URETERS AND BLADDER: Status post left nephrectomy. No stones in the right kidney or ureter. No right hydronephrosis. No perinephric or periureteral stranding on the right. Thick-walled bladder with mild perivesical stranding (image 113), similar to the prior, suggesting cystitis. GI AND BOWEL: Stomach demonstrates no acute abnormality. There is no bowel obstruction. REPRODUCTIVE ORGANS: Status post hysterectomy. PERITONEUM AND RETROPERITONEUM: No ascites. No free air. VASCULATURE: Thoracic aortic atherosclerosis. Atherosclerotic calcifications of the abdominal aorta and branch vessels. ABDOMINAL AND PELVIS LYMPH NODES: No lymphadenopathy. BONES AND SOFT TISSUES: Status post PLIF at L4-S1. Mild degenerative changes of the lower thoracic and lumbar spine, most prominent at L2-3. No acute osseous abnormality. No focal soft tissue abnormality. IMPRESSION: 1. No evidence of acute traumatic injury. 2. Thick-walled bladder with mild perivesical stranding,  unchanged from prior, compatible with cystitis. 3. Additional stable ancillary findings, as above. Electronically signed by: Pinkie Pebbles MD 03/29/2024 10:24 PM EST RP Workstation: [id number]   CT Cervical Spine Wo Contrast Result Date: 03/29/2024 EXAM: CT CERVICAL SPINE WITHOUT CONTRAST 03/29/2024 10:18:20 PM TECHNIQUE: CT of the cervical spine was performed without the administration of intravenous contrast. Multiplanar reformatted images are provided for review. Automated exposure control, iterative reconstruction, and/or weight based adjustment of the mA/kV was utilized to reduce the radiation dose to as low as reasonably achievable. COMPARISON: None available. CLINICAL HISTORY: Neck trauma (Age >= 65y) FINDINGS: CERVICAL SPINE: BONES AND ALIGNMENT: No acute fracture or traumatic malalignment. DEGENERATIVE CHANGES: Mild  degenerative changes at C4-C5. SOFT TISSUES: No prevertebral soft tissue swelling. IMPRESSION: 1. No acute abnormality of the cervical spine. Electronically signed by: Pinkie Pebbles MD 03/29/2024 10:20 PM EST RP Workstation: [id number]   CT Head Wo Contrast Result Date: 03/29/2024 EXAM: CT HEAD WITHOUT 03/29/2024 10:18:20 PM TECHNIQUE: CT of the head was performed without the administration of intravenous contrast. Automated exposure control, iterative reconstruction, and/or weight based adjustment of the mA/kV was utilized to reduce the radiation dose to as low as reasonably achievable. COMPARISON: None available. CLINICAL HISTORY: Head trauma, intracranial arterial injury suspected. FINDINGS: BRAIN AND VENTRICLES: Global cortical atrophy. Subcortical and periventricular small vessel ischemic changes. Mild intracranial atherosclerosis. No acute intracranial hemorrhage. No mass effect or midline shift. No extra-axial fluid collection. No evidence of acute infarct. No hydrocephalus. ORBITS: No acute abnormality. SINUSES AND MASTOIDS: No acute abnormality. SOFT TISSUES AND SKULL: No  acute skull fracture. No acute soft tissue abnormality. IMPRESSION: 1. No acute intracranial abnormality. Electronically signed by: Pinkie Pebbles MD 03/29/2024 10:20 PM EST RP Workstation: [id number]   CUP PACEART REMOTE DEVICE CHECK Result Date: 03/26/2024 PPM Scheduled remote reviewed. Normal device function.  Presenting rhythm: AP-VS.  Battery at RRT as of 03/14/24, known to clinic, generator change scheduled for 04/19/24. Next remote transmission per protocol. - CS, CVRS   Alm Schneider, DO  [facility]  If 7PM-7AM, please contact night-coverage [email or web address] Password [email or web address] 03/31/2024, 11:08 AM   LOS: 1 day

## 2024-03-31 NOTE — Plan of Care (Signed)
°  Problem: Acute Rehab OT Goals (only OT should resolve) Goal: Pt. Will Perform Grooming Flowsheets (Taken 03/31/2024 0947) Pt Will Perform Grooming:  with modified independence  standing Goal: Pt. Will Perform Lower Body Dressing Flowsheets (Taken 03/31/2024 0947) Pt Will Perform Lower Body Dressing: with modified independence Goal: Pt. Will Transfer To Toilet Flowsheets (Taken 03/31/2024 [phone]) Pt Will Transfer to Toilet:  with modified independence  ambulating Goal: Pt. Will Perform Toileting-Clothing Manipulation Flowsheets (Taken 03/31/2024 0947) Pt Will Perform Toileting - Clothing Manipulation and hygiene: with modified independence Goal: Pt/Caregiver Will Perform Home Exercise Program Flowsheets (Taken 03/31/2024 [phone]) Pt/caregiver will Perform Home Exercise Program:  Increased strength  Both right and left upper extremity  Independently  Nealy Karapetian OT, MOT

## 2024-03-31 NOTE — NC FL2 (Signed)
 [place]  MEDICAID FL2 LEVEL OF CARE FORM     IDENTIFICATION  Patient Name: Lorraine Leonard Birthdate: [birth year] Sex: female Admission Date (Current Location): 03/29/2024  [facility] and [place] Number:  [employer] and Address:  [facility],  [address], [place] [address]      Provider Number: [id number]  Attending Physician Name and Address:  Evonnie Lenis, MD  Relative Name and Phone Number:  Al Solian ( daughter ) : [phone]    Current Level of Care: Hospital Recommended Level of Care: Skilled Nursing Facility Prior Approval Number:    Date Approved/Denied:   PASRR Number:    Discharge Plan: SNF    Current Diagnoses: Patient Active Problem List   Diagnosis Date Noted   Acute renal failure superimposed on stage 3b chronic kidney disease (HCC) 03/31/2024   Acute metabolic encephalopathy 03/30/2024   Chronic heart failure with preserved ejection fraction (HFpEF) (HCC) 03/30/2024   UTI (urinary tract infection) 03/29/2024   Candidal intertrigo 01/29/2024   Complete heart block (HCC) 01/29/2024   Acute kidney injury 01/28/2024   Cystitis 01/28/2024   Hypercalcemia 01/28/2024   History of COPD 01/28/2024   Wound of left leg 11/24/2023   Left leg cellulitis 11/24/2023   Recurrent UTI 10/06/2023   Atrophic vaginitis 10/06/2023   Immunosuppression due to drug therapy 10/06/2023   Ambulatory dysfunction 10/06/2023   Dependent for transportation 10/06/2023   Epigastric pain 03/27/2021   Gastric nodule 03/27/2021   Iron  deficiency anemia due to chronic blood loss 03/27/2021   History of colonic polyps 03/27/2021   Intractable back pain 07/08/2020   CKD (chronic kidney disease), stage 3b (HCC) 07/08/2020   Aorto-iliac atherosclerosis 07/08/2020   Chronic diastolic heart failure (HCC) 07/08/2020   Asthmatic bronchitis , chronic (HCC) 03/19/2020   Idiopathic urticaria 10/19/2018   Osteoarthritis 07/22/2018   Chronic respiratory failure  with hypoxia (HCC) 03/26/2017   COPD (chronic obstructive pulmonary disease) (HCC) 03/26/2017   Permanent atrial fibrillation (HCC) 02/09/2017   Acute diastolic CHF (congestive heart failure) (HCC) 11/11/2016   Pulmonary fibrosis (HCC) 09/23/2016   Type 2 diabetes mellitus (HCC) 07/17/2016   Obesity (BMI 30-39.9) 04/18/2016   Snoring 11/09/2015   Pulmonary hypertension (HCC) 11/09/2015   Gout 10/19/2015   Chronic urticaria 03/24/2015   Metabolic syndrome 03/01/2015   Solitary kidney, acquired 03/01/2015   Macular degeneration of both eyes 11/28/2014   Constipation 11/28/2014   Chronic rhinitis 11/17/2014   Hypertensive retinopathy 10/30/2014   Vitamin D  deficiency 08/17/2014   Arthritis of knee 08/17/2014   Hyperlipidemia associated with type 2 diabetes mellitus (HCC) 08/17/2014   Depression    Acquired hypothyroidism    Anxiety    Cardiomegaly 05/05/2011   Essential hypertension 02/08/2007   PULMONARY NODULE 02/08/2007   GASTROESOPHAGEAL REFLUX DISEASE 02/08/2007    Orientation RESPIRATION BLADDER Height & Weight     Self, Situation  Normal Incontinent Weight: 164 lb 7.4 oz (74.6 kg) Height:  5' 7 (170.2 cm)  BEHAVIORAL SYMPTOMS/MOOD NEUROLOGICAL BOWEL NUTRITION STATUS      Incontinent Diet (Regular)  AMBULATORY STATUS COMMUNICATION OF NEEDS Skin   Extensive Assist Verbally Bruising                       Personal Care Assistance Level of Assistance  Bathing, Feeding, Dressing Bathing Assistance: Maximum assistance Feeding assistance: Independent Dressing Assistance: Maximum assistance     Functional Limitations Info  Sight, Hearing, Speech Sight Info: Impaired (wear eyeglasses) Hearing  Info: Adequate Speech Info: Adequate    SPECIAL CARE FACTORS FREQUENCY  PT (By licensed PT), OT (By licensed OT)     PT Frequency: 5 x a week OT Frequency: 5 x a week            Contractures Contractures Info: Not present    Additional Factors Info  Code Status  Code Status Info: DNR- Limited             Current Medications (03/31/2024):  This is the current hospital active medication list Current Facility-Administered Medications  Medication Dose Route Frequency Provider Last Rate Last Admin   acetaminophen  (TYLENOL ) tablet 650 mg  650 mg Oral Q6H PRN Tat, Alm, MD   650 mg at 03/31/24 0819   apixaban  (ELIQUIS ) tablet 2.5 mg  2.5 mg Oral BID Tat, Alm, MD   2.5 mg at 03/31/24 9180   cefTRIAXone  (ROCEPHIN ) 2 g in sodium chloride  0.9 % 100 mL IVPB  2 g Intravenous Q24H Evonnie Alm, MD 200 mL/hr at 03/30/24 2203 2 g at 03/30/24 2203   lactated ringers  infusion   Intravenous Continuous Tat, Alm, MD 75 mL/hr at 03/31/24 0819 New Bag at 03/31/24 0819   levothyroxine  (SYNTHROID ) tablet 100 mcg  100 mcg Oral [id number] Shona Terry SAILOR, DO   100 mcg at 03/31/24 0401   magnesium  sulfate IVPB 2 g 50 mL  2 g Intravenous Once Tat, Alm, MD       melatonin tablet 6 mg  6 mg Oral QHS PRN Shona Terry SAILOR, DO       pantoprazole  (PROTONIX ) EC tablet 40 mg  40 mg Oral Daily Shona Terry SAILOR, DO   40 mg at 03/31/24 0819   polyethylene glycol (MIRALAX  / GLYCOLAX ) packet 17 g  17 g Oral Daily PRN Shona Terry N, DO       potassium chloride  (KLOR-CON ) packet 20 mEq  20 mEq Oral Once Tat, David, MD       prochlorperazine (COMPAZINE) injection 5 mg  5 mg Intravenous Q6H PRN Shona Terry SAILOR, DO         Discharge Medications: Please see discharge summary for a list of discharge medications.  Relevant Imaging Results:  Relevant Lab Results:   Additional Information SSN: [id number] LCSWA

## 2024-03-31 NOTE — Progress Notes (Signed)
 Mobility Specialist Progress Note:    03/31/24 1330  Mobility  Activity Pivoted/transferred to/from BSC  Level of Assistance Maximum assist, patient does 25-49%  Assistive Device None  Distance Ambulated (ft) 4 ft  Range of Motion/Exercises Active;All extremities  Activity Response Tolerated well  Mobility Referral Yes  Mobility visit 1 Mobility  Mobility Specialist Start Time (ACUTE ONLY) 1330  Mobility Specialist Stop Time (ACUTE ONLY) 1345  Mobility Specialist Time Calculation (min) (ACUTE ONLY) 15 min   Pt received sitting EOB, NT in room. Required MaxA to stand and transfer to [facility]. Tolerated well, confused. Left supine, alarm on. All needs met.  Ashantia Amaral Mobility Specialist Please contact via Special Educational Needs Teacher or  Rehab office at [phone]

## 2024-03-31 NOTE — Progress Notes (Signed)
°   03/31/24 1000  What Happened - PT assisted patient to the chair this morning around 0900 when Brooke NT arrived in the patients room to clean her up at 0945 the patient was sitting on the end of the chair. I (nurse) was called to bedside where we tried to get the patient back in the chair and that is when the assisted fall took place. We lowered the patient softly to the floor matt. After we got her to the matt Darren (mobility tech) came to assist us  back into her bed.    Was fall witnessed? Yes  Who witnessed fall? Alberta Lenhard F and Brooke D  Patients activity before fall to/from bed, chair, or stretcher  Point of contact buttocks  Was patient injured? No  Provider Notification  Provider Name/Title Dr. Evonnie  Date Provider Notified 03/31/24  Time Provider Notified 1016  Method of Notification Page  Notification Reason Fall  Adult Fall Risk Assessment  Risk Factor Category (scoring not indicated) High fall risk per protocol (document High fall risk)  Patient Fall Risk Level High fall risk  Adult Fall Risk Interventions  Required Bundle Interventions *See Row Information* High fall risk  Additional Interventions Use of appropriate toileting equipment (bedpan, BSC, etc.)  Fall intervention(s) refused/Patient educated regarding refusal Bed alarm;Nonskid socks;Yellow bracelet (Fall matt)  Screening for Fall Injury Risk (To be completed on HIGH fall risk patients) - Assessing Need for Floor Mats  Risk For Fall Injury- Criteria for Floor Mats Admitted as a result of a fall  Will Implement Floor Mats Yes  Vitals  Temp 97.6 F (36.4 C)  Temp Source Oral  BP (!) 85/70  MAP (mmHg) 75  BP Location Left Arm  BP Method Automatic  Patient Position (if appropriate) Lying  Pulse Rate 67  Pulse Rate Source Dinamap  Resp 17  Oxygen  Therapy  SpO2 97 %  O2 Device Room Air  PAINAD (Pain Assessment in Advanced Dementia)  Breathing 0  Negative Vocalization 0  Facial Expression 0  Body Language 0   Consolability 0  PAINAD Score 0  Neurological  Neuro (WDL) X  Level of Consciousness Alert  Orientation Level Disoriented to time;Disoriented to situation;Disoriented to place  Cognition Follows commands  Speech Clear  R Pupil Size (mm) 3  R Pupil Shape Round  R Pupil Reaction Brisk  L Pupil Size (mm) 3  L Pupil Shape Round  L Pupil Reaction Brisk  Neuro Symptoms Forgetful;Anxiety  Neuro symptoms relieved by Rest  Glasgow Coma Scale  Eye Opening 3  Best Verbal Response (NON-intubated) 4  Best Motor Response 6  Glasgow Coma Scale Score 13  Musculoskeletal  Musculoskeletal (WDL) X  Assistive Device None  Generalized Weakness Yes  Weight Bearing Restrictions Per Provider Order No  Integumentary  Integumentary (WDL) X  Skin Color Appropriate for ethnicity  Skin Condition Dry  Skin Integrity Erythema/redness;Ecchymosis  Ecchymosis Location Arm;Back;Buttocks;Leg  Ecchymosis Location Orientation Bilateral  Erythema/Redness Location Buttocks  Erythema/Redness Location Orientation Bilateral  Skin Turgor Non-tenting

## 2024-03-31 NOTE — Progress Notes (Signed)
 I contacted son Alonso) to inform him about his mother and her assisted fall. He had no other questions or concerns at this time.

## 2024-04-01 ENCOUNTER — Telehealth: Payer: Self-pay

## 2024-04-01 ENCOUNTER — Inpatient Hospital Stay (HOSPITAL_COMMUNITY)

## 2024-04-01 DIAGNOSIS — I5032 Chronic diastolic (congestive) heart failure: Secondary | ICD-10-CM

## 2024-04-01 DIAGNOSIS — J441 Chronic obstructive pulmonary disease with (acute) exacerbation: Secondary | ICD-10-CM

## 2024-04-01 LAB — BASIC METABOLIC PANEL WITH GFR
Anion gap: 11 (ref 5–15)
BUN: 17 mg/dL (ref 8–23)
CO2: 22 mmol/L (ref 22–32)
Calcium: 9.2 mg/dL (ref 8.9–10.3)
Chloride: 110 mmol/L (ref 98–111)
Creatinine, Ser: 1.33 mg/dL — ABNORMAL HIGH (ref 0.44–1.00)
GFR, Estimated: 40 mL/min — ABNORMAL LOW (ref >60)
Glucose, Bld: 114 mg/dL — ABNORMAL HIGH (ref 70–99)
Potassium: 3.6 mmol/L (ref 3.5–5.1)
Sodium: 142 mmol/L (ref 135–145)

## 2024-04-01 LAB — MAGNESIUM: Magnesium: 2.3 mg/dL (ref 1.7–2.4)

## 2024-04-01 LAB — PROCALCITONIN: Procalcitonin: 0.14 ng/mL

## 2024-04-01 LAB — PRO BRAIN NATRIURETIC PEPTIDE: Pro Brain Natriuretic Peptide: 11746 pg/mL — ABNORMAL HIGH (ref <300.0)

## 2024-04-01 MED ORDER — BUDESONIDE 0.5 MG/2ML IN SUSP
0.5000 mg | Freq: Two times a day (BID) | RESPIRATORY_TRACT | Status: DC
  Administered 2024-04-01 – 2024-04-02 (×2): 0.5 mg via RESPIRATORY_TRACT
  Filled 2024-04-01 (×2): qty 2

## 2024-04-01 MED ORDER — METHYLPREDNISOLONE SODIUM SUCC 125 MG IJ SOLR
60.0000 mg | Freq: Two times a day (BID) | INTRAMUSCULAR | Status: DC
  Administered 2024-04-01 – 2024-04-02 (×2): 60 mg via INTRAVENOUS
  Filled 2024-04-01 (×2): qty 2

## 2024-04-01 MED ORDER — IPRATROPIUM-ALBUTEROL 0.5-2.5 (3) MG/3ML IN SOLN
3.0000 mL | Freq: Four times a day (QID) | RESPIRATORY_TRACT | Status: DC
  Administered 2024-04-01 – 2024-04-02 (×3): 3 mL via RESPIRATORY_TRACT
  Filled 2024-04-01 (×5): qty 3

## 2024-04-01 MED ORDER — ENSURE PLUS HIGH PROTEIN PO LIQD
237.0000 mL | Freq: Two times a day (BID) | ORAL | Status: DC
  Administered 2024-04-01: 237 mL via ORAL

## 2024-04-01 NOTE — Care Management Important Message (Signed)
 Important Message  Patient Details  Name: Lorraine Leonard MRN: [id number] Date of Birth: 1940/12/27   Important Message Given:  Yes - Medicare IM     [name] [name] 04/01/2024, 2:33 PM

## 2024-04-01 NOTE — Telephone Encounter (Signed)
 Called pt's Daughter to discuss pt's upcoming PPM Gen Change on 12/30 with Dr. Waddell. Pt is currently admitted at [facility] for UTI that has caused Sepsis.  She is scheduled to be discharged tomorrow to a SNF.   The Daughter stated that her Mother is in bad shape and she doesn't know if she needs to move fwd with this procedure at this time. She ask if she could call me back next week and give me an update on her and we could make a decision at that time.  I gave her my direct number and she will call me on Thursday to update me.

## 2024-04-01 NOTE — Telephone Encounter (Signed)
 Pt currently admitted for severe UTI - being discharged tomorrow to SNF. Daughter will call me next week to update us  and make a decision on having procedure or not.

## 2024-04-01 NOTE — Progress Notes (Signed)
 PROGRESS NOTE  Lorraine Leonard FMW:[id number] DOB: Aug 20, 1940 DOA: 03/29/2024 PCP: Lavell Bari LABOR, FNP  Brief History:  83 year old female with a history of pulmonary fibrosis/ILD, hypothyroidism, permanent atrial fibrillation status post ablation and PPM, HFpEF, hypertension, hyperlipidemia, and CKD stage IIIb, and chronic respiratory failure on 3 L presenting after being found lethargic by her son.  Per family member, her son [name], via phone, the patient is in hospice care and is a DNR/DNI.  The patient was found at home lethargic.  In the ED, the patient was afebrile with soft blood pressures in the upper 90s and low 100s.  She was hemodynamically stable.  Oxygen  saturation was 98% room air.  WBC 7.1, hemoglobin 12.5, platelets 234.  Sodium 138, 3.7, bicarbonate 20, serum creatinine 1.91.  LFTs were unremarkable.  Lactic acid 2.2>> 1.5.  UA > 50 WBC.  COVID-19 PCR was negative.  CT brain and CT cervical spine were negative for any acute findings.  X-ray of the left pelvis was negative.  Chest x-ray was negative.  The patient was started on IV fluids and ceftriaxone .     Assessment/Plan: Acute metabolic encephalopathy - Secondary to UTI - Check VBG--7.29/42/<31/20 - B12--654 -folate--13.1 - TSH--4.730 - CK 351 - more alert and conversant   UTI--Citrobacter freundii -UA>50 WBC -Continue ceftriaxone  pending culture data-   Acute on chronic renal failure--CKD 3B -baseline creatinine 1.1-1.4 -presented with serum creatinine 1.91 -due to volume depletion -continue IVF>>improving   Paroxysmal atrial fibrillation - Status post AV node ablation and PPM - Continue apixaban  - Holding atenolol  secondary to soft blood pressure   Hypothyroidism - Continue Synthroid    GERD - Continue pantoprazole    Goals of care - DNR/DNI confirmed with family   Chronic HFpEF - Clinically compensated - 07/23/2022 echo EF 65 to 70%, no WMA, normal RVF - On furosemide  every 48 hours at  home--holding temporarily 12/11 VS at 1115--HR 87--RR16-106/63--99%RA   COPD/pulmonary fibrosis/ILD - Stable without exacerbation - previously on home oxygen , - now stable on RA - previously saw Dr. Erle not seen since 08/07/21 - CXR today -12/12--start duonebs, start pulmicort as pt is having wheezing today   Hypomagnesemia -repleted             Family Communication: grand daughter updated 12/12   Consultants:  none   Code Status:  DNR   DVT Prophylaxis:  apixaban      Procedures: As Listed in Progress Note Above   Antibiotics: Ceftriaxone  12/9>>        Subjective:  Pt has some sob today.  Denies cp, n/v/d, abd pain, f/c  Objective: Vitals:   03/31/24 2350 04/01/24 0422 04/01/24 0945 04/01/24 1144  BP: (!) 124/45 (!) 126/48 97/67 (!) 115/45  Pulse: 66 77 79 64  Resp: 18 18 20 20   Temp: (!) 97.3 F (36.3 C) 98.1 F (36.7 C) 98.4 F (36.9 C) 98.1 F (36.7 C)  TempSrc:  Oral Oral Oral  SpO2: 97% 97% 94% 100%  Weight:      Height:        Intake/Output Summary (Last 24 hours) at 04/01/2024 1516 Last data filed at 04/01/2024 0915 Gross per 24 hour  Intake 120 ml  Output --  Net 120 ml   Weight change:  Exam:  General:  Pt is alert, follows commands appropriately, not in acute distress HEENT: No icterus, No thrush, No neck mass, [place]/AT Cardiovascular: RRR, S1/S2, no rubs, no gallops Respiratory: bibasilar rales.  Upper airway wheeze Abdomen: Soft/+BS, non tender, non distended, no guarding Extremities: No edema, No lymphangitis, No petechiae, No rashes, no synovitis   Data Reviewed: I have personally reviewed following labs and imaging studies Basic Metabolic Panel: Recent Labs  Lab 03/29/24 2031 03/30/24 0411 03/31/24 0612 04/01/24 0558  NA 138 140 143 142  K 3.7 3.6 3.5 3.6  CL 105 111 111 110  CO2 20* 16* 20* 22  GLUCOSE 148* 123* 134* 114*  BUN 26* 23 21 17   CREATININE 1.91* 1.64* 1.56* 1.33*  CALCIUM  9.9 8.6* 9.3 9.2  MG   --  1.9 1.6* 2.3  PHOS  --  2.8  --   --    Liver Function Tests: Recent Labs  Lab 03/29/24 2031 03/30/24 0411  AST 46* 41  ALT 14 12  ALKPHOS 79 72  BILITOT 0.7 0.5  PROT 6.8 5.8*  ALBUMIN 3.6 3.2*   No results for input(s): LIPASE, AMYLASE in the last 168 hours. No results for input(s): AMMONIA in the last 168 hours. Coagulation Profile: Recent Labs  Lab 03/29/24 2031  INR 2.4*   CBC: Recent Labs  Lab 03/29/24 2105 03/30/24 0411  WBC 7.1 7.0  NEUTROABS  --  3.5  HGB 12.5 10.9*  HCT 38.2 34.8*  MCV 94.1 98.0  PLT 234 231   Cardiac Enzymes: Recent Labs  Lab 03/30/24 0411  CKTOTAL 351*   BNP: Invalid input(s): POCBNP CBG: No results for input(s): GLUCAP in the last 168 hours. HbA1C: No results for input(s): HGBA1C in the last 72 hours. Urine analysis:    Component Value Date/Time   COLORURINE YELLOW 03/29/2024 2045   APPEARANCEUR TURBID (A) 03/29/2024 2045   APPEARANCEUR Clear 01/26/2024 1545   LABSPEC 1.015 03/29/2024 2045   PHURINE 5.0 03/29/2024 2045   GLUCOSEU NEGATIVE 03/29/2024 2045   HGBUR LARGE (A) 03/29/2024 2045   BILIRUBINUR NEGATIVE 03/29/2024 2045   BILIRUBINUR Negative 01/26/2024 1545   KETONESUR NEGATIVE 03/29/2024 2045   PROTEINUR 100 (A) 03/29/2024 2045   UROBILINOGEN negative 04/06/2015 1517   UROBILINOGEN 0.2 05/14/2011 1942   NITRITE NEGATIVE 03/29/2024 2045   LEUKOCYTESUR MODERATE (A) 03/29/2024 2045   Sepsis Labs: @LABRCNTIP (procalcitonin:4,lacticidven:4) ) Recent Results (from the past 240 hours)  Urine Culture     Status: Abnormal (Preliminary result)   Collection Time: 03/29/24  8:45 PM   Specimen: Urine, Random  Result Value Ref Range Status   Specimen Description   Final    URINE, RANDOM Performed at [facility], [address], [place], [place] [address]    Special Requests   Final    NONE Reflexed from [id number] Performed at [facility], [address], [place], [place] [address]    Culture (A)   Final    >=100,000 COLONIES/mL CITROBACTER FREUNDII CONFIRMATION OF SUSCEPTIBILITIES IN PROGRESS Performed at [facility], [address], [place], [place] [address]    Report Status PENDING  Incomplete  Blood Culture (routine x 2)     Status: None (Preliminary result)   Collection Time: 03/29/24  9:29 PM   Specimen: BLOOD  Result Value Ref Range Status   Specimen Description BLOOD BLOOD LEFT ARM  Final   Special Requests   Final    BOTTLES DRAWN AEROBIC AND ANAEROBIC Blood Culture adequate volume   Culture   Final    NO GROWTH 3 DAYS Performed at [facility], [address], [place], [place] [address]    Report Status PENDING  Incomplete  Blood Culture (routine  x 2)     Status: None (Preliminary result)   Collection Time: 03/29/24  9:30 PM   Specimen: BLOOD  Result Value Ref Range Status   Specimen Description BLOOD BLOOD RIGHT WRIST  Final   Special Requests   Final    BOTTLES DRAWN AEROBIC AND ANAEROBIC Blood Culture results may not be optimal due to an inadequate volume of blood received in culture bottles   Culture   Final    NO GROWTH 3 DAYS Performed at [facility], [address] [place], [place] [address]    Report Status PENDING  Incomplete  Resp panel by RT-PCR (RSV, Flu A&B, Covid) Anterior Nasal Swab     Status: None   Collection Time: 03/29/24  9:36 PM   Specimen: Anterior Nasal Swab  Result Value Ref Range Status   SARS Coronavirus 2 by RT PCR NEGATIVE NEGATIVE Final    Comment: (NOTE) SARS-CoV-2 target nucleic acids are NOT DETECTED.  The SARS-CoV-2 RNA is generally detectable in upper respiratory specimens during the acute phase of infection. The lowest concentration of SARS-CoV-2 viral copies this assay can detect is 138 copies/mL. A negative result does not preclude SARS-Cov-2 infection and should not be used as the sole basis for treatment or other patient management decisions. A negative result may occur with  improper specimen  collection/handling, submission of specimen other than nasopharyngeal swab, presence of viral mutation(s) within the areas targeted by this assay, and inadequate number of viral copies(<138 copies/mL). A negative result must be combined with clinical observations, patient history, and epidemiological information. The expected result is Negative.  Fact Sheet for Patients:  bloggercourse.com  Fact Sheet for Healthcare Providers:  seriousbroker.it  This test is no t yet approved or cleared by the [place]  FDA and  has been authorized for detection and/or diagnosis of SARS-CoV-2 by FDA under an Emergency Use Authorization (EUA). This EUA will remain  in effect (meaning this test can be used) for the duration of the COVID-19 declaration under Section 564(b)(1) of the Act, 21 U.S.C.section 360bbb-3(b)(1), unless the authorization is terminated  or revoked sooner.       Influenza A by PCR NEGATIVE NEGATIVE Final   Influenza B by PCR NEGATIVE NEGATIVE Final    Comment: (NOTE) The Xpert Xpress SARS-CoV-2/FLU/RSV plus assay is intended as an aid in the diagnosis of influenza from Nasopharyngeal swab specimens and should not be used as a sole basis for treatment. Nasal washings and aspirates are unacceptable for Xpert Xpress SARS-CoV-2/FLU/RSV testing.  Fact Sheet for Patients: bloggercourse.com  Fact Sheet for Healthcare Providers: seriousbroker.it  This test is not yet approved or cleared by the [place]  FDA and has been authorized for detection and/or diagnosis of SARS-CoV-2 by FDA under an Emergency Use Authorization (EUA). This EUA will remain in effect (meaning this test can be used) for the duration of the COVID-19 declaration under Section 564(b)(1) of the Act, 21 U.S.C. section 360bbb-3(b)(1), unless the authorization is terminated or revoked.     Resp Syncytial  Virus by PCR NEGATIVE NEGATIVE Final    Comment: (NOTE) Fact Sheet for Patients: bloggercourse.com  Fact Sheet for Healthcare Providers: seriousbroker.it  This test is not yet approved or cleared by the [place]  FDA and has been authorized for detection and/or diagnosis of SARS-CoV-2 by FDA under an Emergency Use Authorization (EUA). This EUA will remain in effect (meaning this test can be used) for the duration of the COVID-19 declaration under Section 564(b)(1) of the Act, 21 U.S.C.  section 360bbb-3(b)(1), unless the authorization is terminated or revoked.  Performed at [facility], [address] [place], [place] [address]      Scheduled Meds:  apixaban   2.5 mg Oral BID   feeding supplement  237 mL Oral BID BM   levothyroxine   100 mcg Oral Q0600   OLANZapine zydis  2.5 mg Oral QHS   pantoprazole   40 mg Oral Daily   Continuous Infusions:  cefTRIAXone  (ROCEPHIN )  IV 2 g (03/31/24 2128)    Procedures/Studies: DG Hip Unilat W or Wo Pelvis 2-3 Views Left Result Date: 03/29/2024 CLINICAL DATA:  Fall with left-sided hip pain EXAM: DG HIP (WITH OR WITHOUT PELVIS) 2-3V LEFT COMPARISON:  CT 03/29/2024 FINDINGS: Hardware in the lumbo sacral spine. No definitive fracture or malalignment. IMPRESSION: No definite acute osseous abnormality. Electronically Signed   By: Luke Bun M.D.   On: 03/29/2024 22:42   DG Chest Port 1 View Result Date: 03/29/2024 CLINICAL DATA:  Possible sepsis EXAM: PORTABLE CHEST 1 VIEW COMPARISON:  Fall 05/27/2022, CT 03/29/2024, 07/19/2018 FINDINGS: Left-sided pacing device as before. Enlarged cardiomediastinal silhouette with aortic atherosclerosis. Prominent mediastinum, likely due to portable technique and patient rotation. Mild reticular and interstitial opacities, corresponding to scarring on CT. No acute confluent airspace disease. No pleural effusion or pneumothorax. IMPRESSION: No active disease.  Cardiomegaly with chronic changes. Electronically Signed   By: Luke Bun M.D.   On: 03/29/2024 22:40   CT CHEST ABDOMEN PELVIS WO CONTRAST Result Date: 03/29/2024 EXAM: CT CHEST, ABDOMEN AND PELVIS WITHOUT CONTRAST 03/29/2024 10:18:20 PM TECHNIQUE: CT of the chest, abdomen and pelvis was performed without the administration of intravenous contrast. Multiplanar reformatted images are provided for review. Automated exposure control, iterative reconstruction, and/or weight based adjustment of the mA/kV was utilized to reduce the radiation dose to as low as reasonably achievable. COMPARISON: CT abdomen/pelvis dated 02/04/2024 and CT chest dated 03/23/2018. CLINICAL HISTORY: Polytrauma, blunt. FINDINGS: CHEST: MEDIASTINUM AND LYMPH NODES: Mild cardiomegaly. Left subclavian pacemaker. The central airways are clear. No mediastinal, hilar or axillary lymphadenopathy. LUNGS AND PLEURA: Mild subpleural scarring in the lungs bilaterally. No focal consolidation or pulmonary edema. No pleural effusion or pneumothorax. ABDOMEN AND PELVIS: LIVER: The liver is unremarkable. GALLBLADDER AND BILE DUCTS: Gallbladder is unremarkable. No biliary ductal dilatation. SPLEEN: No acute abnormality. PANCREAS: No acute abnormality. ADRENAL GLANDS: No acute abnormality. KIDNEYS, URETERS AND BLADDER: Status post left nephrectomy. No stones in the right kidney or ureter. No right hydronephrosis. No perinephric or periureteral stranding on the right. Thick-walled bladder with mild perivesical stranding (image 113), similar to the prior, suggesting cystitis. GI AND BOWEL: Stomach demonstrates no acute abnormality. There is no bowel obstruction. REPRODUCTIVE ORGANS: Status post hysterectomy. PERITONEUM AND RETROPERITONEUM: No ascites. No free air. VASCULATURE: Thoracic aortic atherosclerosis. Atherosclerotic calcifications of the abdominal aorta and branch vessels. ABDOMINAL AND PELVIS LYMPH NODES: No lymphadenopathy. BONES AND SOFT TISSUES:  Status post PLIF at L4-S1. Mild degenerative changes of the lower thoracic and lumbar spine, most prominent at L2-3. No acute osseous abnormality. No focal soft tissue abnormality. IMPRESSION: 1. No evidence of acute traumatic injury. 2. Thick-walled bladder with mild perivesical stranding, unchanged from prior, compatible with cystitis. 3. Additional stable ancillary findings, as above. Electronically signed by: Pinkie Pebbles MD 03/29/2024 10:24 PM EST RP Workstation: [id number]   CT Cervical Spine Wo Contrast Result Date: 03/29/2024 EXAM: CT CERVICAL SPINE WITHOUT CONTRAST 03/29/2024 10:18:20 PM TECHNIQUE: CT of the cervical spine was performed without the administration of intravenous contrast. Multiplanar reformatted  images are provided for review. Automated exposure control, iterative reconstruction, and/or weight based adjustment of the mA/kV was utilized to reduce the radiation dose to as low as reasonably achievable. COMPARISON: None available. CLINICAL HISTORY: Neck trauma (Age >= 65y) FINDINGS: CERVICAL SPINE: BONES AND ALIGNMENT: No acute fracture or traumatic malalignment. DEGENERATIVE CHANGES: Mild degenerative changes at C4-C5. SOFT TISSUES: No prevertebral soft tissue swelling. IMPRESSION: 1. No acute abnormality of the cervical spine. Electronically signed by: Pinkie Pebbles MD 03/29/2024 10:20 PM EST RP Workstation: [id number]   CT Head Wo Contrast Result Date: 03/29/2024 EXAM: CT HEAD WITHOUT 03/29/2024 10:18:20 PM TECHNIQUE: CT of the head was performed without the administration of intravenous contrast. Automated exposure control, iterative reconstruction, and/or weight based adjustment of the mA/kV was utilized to reduce the radiation dose to as low as reasonably achievable. COMPARISON: None available. CLINICAL HISTORY: Head trauma, intracranial arterial injury suspected. FINDINGS: BRAIN AND VENTRICLES: Global cortical atrophy. Subcortical and periventricular small vessel ischemic  changes. Mild intracranial atherosclerosis. No acute intracranial hemorrhage. No mass effect or midline shift. No extra-axial fluid collection. No evidence of acute infarct. No hydrocephalus. ORBITS: No acute abnormality. SINUSES AND MASTOIDS: No acute abnormality. SOFT TISSUES AND SKULL: No acute skull fracture. No acute soft tissue abnormality. IMPRESSION: 1. No acute intracranial abnormality. Electronically signed by: Pinkie Pebbles MD 03/29/2024 10:20 PM EST RP Workstation: [id number]   CUP PACEART REMOTE DEVICE CHECK Result Date: 03/26/2024 PPM Scheduled remote reviewed. Normal device function.  Presenting rhythm: AP-VS.  Battery at RRT as of 03/14/24, known to clinic, generator change scheduled for 04/19/24. Next remote transmission per protocol. - CS, CVRS   Alm Schneider, DO  [facility]  If 7PM-7AM, please contact night-coverage [email or web address] Password [email or web address] 04/01/2024, 3:16 PM   LOS: 2 days

## 2024-04-01 NOTE — TOC Progression Note (Signed)
 Transition of Care [facility]) - Progression Note    Patient Details  Name: Lorraine Leonard MRN: [id number] Date of Birth: [birth year]  Transition of Care [facility]) CM/SW Contact  Noreen KATHEE [place], [place] Phone Number: 04/01/2024, 2:05 PM  Clinical Narrative:     CSW spoke with daughter this morning regarding auth being approved and getting patient DC tomorrow to [facility]. Daughter asked CSW if patient was made aware. CSW shared that patient was made aware of SNF being recommended, but patient stated that she did not want to go. CSW assured daughter that patient will be made aware.   Addendum 1:50 PM   CSW spoke with patient at bedside. Upon entering the room patient still had her lunch tray in front of her and asked if the food could be placed in her refrigerator. CSW asked patient if she was finished eating and patient stated,  Yes I been eating  .  CSW introduced self to patient, patient acknowledged CSW and stated that she remembered CSW from yesterday. CSW spoke with patient about going to [facility]. Patient started waving at things that were not there and became emotional when talking about going to rehab. Patient asked about how long she would be there and who would pay for it. CSW shared with patient that she would be there for about 2 weeks depending on how well she does with therapy and that her insurance will pay for it. Patient then asked CSW if therapy would be good for her and she will do well. CSW assured patient that it will help her gain her strength back. Patient agreed to go tomorrow to [facility]. ICM will continue to follow.   Expected Discharge Plan: Skilled Nursing Facility Barriers to Discharge: Continued Medical Work up               Expected Discharge Plan and Services       Living arrangements for the past 2 months: Single Family Home                                       Social Drivers of Health (SDOH) Interventions SDOH Screenings   Food  Insecurity: Patient Unable To Answer (03/30/2024)  Housing: Patient Unable To Answer (03/30/2024)  Transportation Needs: Patient Unable To Answer (03/30/2024)  Recent Concern: Transportation Needs - Unmet Transportation Needs (02/17/2024)  Utilities: Patient Unable To Answer (03/30/2024)  Alcohol  Screen: Low Risk (02/17/2024)  Depression (PHQ2-9): Low Risk (02/17/2024)  Recent Concern: Depression (PHQ2-9) - High Risk (01/26/2024)  Financial Resource Strain: Low Risk (02/17/2024)  Physical Activity: Inactive (02/17/2024)  Social Connections: Patient Unable To Answer (03/30/2024)  Recent Concern: Social Connections - Socially Isolated (02/17/2024)  Stress: No Stress Concern Present (02/17/2024)  Recent Concern: Stress - Stress Concern Present (12/29/2023)  Tobacco Use: Low Risk (03/29/2024)  Health Literacy: Adequate Health Literacy (02/17/2024)    Readmission Risk Interventions    04/01/2024    2:04 PM 04/01/2024   10:52 AM 03/31/2024   11:50 AM  Readmission Risk Prevention Plan  Transportation Screening Complete Complete Complete  Medication Review Oceanographer) Complete Complete Complete  HRI or Home Care Consult Complete Complete Complete  SW Recovery Care/Counseling Consult Complete Complete Complete  Palliative Care Screening Complete Complete Complete  Skilled Nursing Facility Complete Complete Complete

## 2024-04-01 NOTE — Progress Notes (Signed)
 PROGRESS NOTE  Lorraine Leonard FMW:[id number] DOB: 1941/01/19 DOA: 03/29/2024 PCP: Lavell Bari LABOR, FNP  Brief History:  83 year old female with a history of pulmonary fibrosis/ILD, hypothyroidism, permanent atrial fibrillation status post ablation and PPM, HFpEF, hypertension, hyperlipidemia, and CKD stage IIIb, and chronic respiratory failure on 3 L presenting after being found lethargic by her son.  Per family member, her son [name], via phone, the patient is in hospice care and is a DNR/DNI.  The patient was found at home lethargic.  In the ED, the patient was afebrile with soft blood pressures in the upper 90s and low 100s.  She was hemodynamically stable.  Oxygen  saturation was 98% room air.  WBC 7.1, hemoglobin 12.5, platelets 234.  Sodium 138, 3.7, bicarbonate 20, serum creatinine 1.91.  LFTs were unremarkable.  Lactic acid 2.2>> 1.5.  UA > 50 WBC.  COVID-19 PCR was negative.  CT brain and CT cervical spine were negative for any acute findings.  X-ray of the left pelvis was negative.  Chest x-ray was negative.  The patient was started on IV fluids and ceftriaxone .     Assessment/Plan: Acute metabolic encephalopathy - Secondary to UTI - Check VBG--7.29/42/<31/20 - B12--654 -folate--13.1 - TSH--4.730 - CK 351 - more alert and conversant   UTI--Citrobacter freundii -UA>50 WBC -Continue ceftriaxone  pending culture data-  COPD/pulmonary fibrosis exacerbation -viral respiratory panel -start duonebs -start brovana -start IV solumedrol -personally reviewed CXR--chronic coarse interstitial markings   Acute on chronic renal failure--CKD 3B -baseline creatinine 1.1-1.4 -presented with serum creatinine 1.91 -due to volume depletion -continue IVF>>improving   Paroxysmal atrial fibrillation - Status post AV node ablation and PPM - Continue apixaban  - Holding atenolol  secondary to soft blood pressure   Hypothyroidism - Continue Synthroid    GERD - Continue  pantoprazole    Goals of care - DNR/DNI confirmed with family   Chronic HFpEF - Clinically compensated - 07/23/2022 echo EF 65 to 70%, no WMA, normal RVF - On furosemide  every 48 hours at home--holding temporarily 12/11 VS at 1115--HR 87--RR16-106/63--99%RA   COPD/pulmonary fibrosis/ILD - Stable without exacerbation - previously on home oxygen , - now stable on RA - previously saw Dr. Erle not seen since 08/07/21 - CXR today -12/12--start duonebs, start pulmicort as pt is having wheezing today   Hypomagnesemia -repleted             Family Communication: grand daughter updated 12/12   Consultants:  none   Code Status:  DNR   DVT Prophylaxis:  apixaban      Procedures: As Listed in Progress Note Above   Antibiotics: Ceftriaxone  12/9>>        Subjective: Pt complains of sob.  Denies cp, n/v/d, abd pain, f/c  Objective: Vitals:   03/31/24 2350 04/01/24 0422 04/01/24 0945 04/01/24 1144  BP: (!) 124/45 (!) 126/48 97/67 (!) 115/45  Pulse: 66 77 79 64  Resp: 18 18 20 20   Temp: (!) 97.3 F (36.3 C) 98.1 F (36.7 C) 98.4 F (36.9 C) 98.1 F (36.7 C)  TempSrc:  Oral Oral Oral  SpO2: 97% 97% 94% 100%  Weight:      Height:        Intake/Output Summary (Last 24 hours) at 04/01/2024 1630 Last data filed at 04/01/2024 0915 Gross per 24 hour  Intake 120 ml  Output --  Net 120 ml   Weight change:  Exam:  General:  Pt is alert, follows commands appropriately, not in acute distress HEENT:  No icterus, No thrush, No neck mass, [place]/AT Cardiovascular: RRR, S1/S2, no rubs, no gallops Respiratory: mild anterior wheezing.  Bibasilar rales. Abdomen: Soft/+BS, non tender, non distended, no guarding Extremities: No edema, No lymphangitis, No petechiae, No rashes, no synovitis   Data Reviewed: I have personally reviewed following labs and imaging studies Basic Metabolic Panel: Recent Labs  Lab 03/29/24 2031 03/30/24 0411 03/31/24 0612 04/01/24 0558  NA 138  140 143 142  K 3.7 3.6 3.5 3.6  CL 105 111 111 110  CO2 20* 16* 20* 22  GLUCOSE 148* 123* 134* 114*  BUN 26* 23 21 17   CREATININE 1.91* 1.64* 1.56* 1.33*  CALCIUM  9.9 8.6* 9.3 9.2  MG  --  1.9 1.6* 2.3  PHOS  --  2.8  --   --    Liver Function Tests: Recent Labs  Lab 03/29/24 2031 03/30/24 0411  AST 46* 41  ALT 14 12  ALKPHOS 79 72  BILITOT 0.7 0.5  PROT 6.8 5.8*  ALBUMIN 3.6 3.2*   No results for input(s): LIPASE, AMYLASE in the last 168 hours. No results for input(s): AMMONIA in the last 168 hours. Coagulation Profile: Recent Labs  Lab 03/29/24 2031  INR 2.4*   CBC: Recent Labs  Lab 03/29/24 2105 03/30/24 0411  WBC 7.1 7.0  NEUTROABS  --  3.5  HGB 12.5 10.9*  HCT 38.2 34.8*  MCV 94.1 98.0  PLT 234 231   Cardiac Enzymes: Recent Labs  Lab 03/30/24 0411  CKTOTAL 351*   BNP: Invalid input(s): POCBNP CBG: No results for input(s): GLUCAP in the last 168 hours. HbA1C: No results for input(s): HGBA1C in the last 72 hours. Urine analysis:    Component Value Date/Time   COLORURINE YELLOW 03/29/2024 2045   APPEARANCEUR TURBID (A) 03/29/2024 2045   APPEARANCEUR Clear 01/26/2024 1545   LABSPEC 1.015 03/29/2024 2045   PHURINE 5.0 03/29/2024 2045   GLUCOSEU NEGATIVE 03/29/2024 2045   HGBUR LARGE (A) 03/29/2024 2045   BILIRUBINUR NEGATIVE 03/29/2024 2045   BILIRUBINUR Negative 01/26/2024 1545   KETONESUR NEGATIVE 03/29/2024 2045   PROTEINUR 100 (A) 03/29/2024 2045   UROBILINOGEN negative 04/06/2015 1517   UROBILINOGEN 0.2 05/14/2011 1942   NITRITE NEGATIVE 03/29/2024 2045   LEUKOCYTESUR MODERATE (A) 03/29/2024 2045   Sepsis Labs: @LABRCNTIP (procalcitonin:4,lacticidven:4) ) Recent Results (from the past 240 hours)  Urine Culture     Status: Abnormal (Preliminary result)   Collection Time: 03/29/24  8:45 PM   Specimen: Urine, Random  Result Value Ref Range Status   Specimen Description   Final    URINE, RANDOM Performed at [facility], [address], [place], [place] [address]    Special Requests   Final    NONE Reflexed from [id number] Performed at [facility], [address], [place], [place] [address]    Culture (A)  Final    >=100,000 COLONIES/mL CITROBACTER FREUNDII CONFIRMATION OF SUSCEPTIBILITIES IN PROGRESS Performed at [facility], [address] [place], [place] [address]    Report Status PENDING  Incomplete  Blood Culture (routine x 2)     Status: None (Preliminary result)   Collection Time: 03/29/24  9:29 PM   Specimen: BLOOD  Result Value Ref Range Status   Specimen Description BLOOD BLOOD LEFT ARM  Final   Special Requests   Final    BOTTLES DRAWN AEROBIC AND ANAEROBIC Blood Culture adequate volume   Culture   Final    NO GROWTH 3 DAYS Performed at [facility], [address], [place], [place] [address]    Report Status PENDING  Incomplete  Blood Culture (routine x 2)     Status: None (Preliminary result)   Collection Time: 03/29/24  9:30 PM   Specimen: BLOOD  Result Value Ref Range Status   Specimen Description BLOOD BLOOD RIGHT WRIST  Final   Special Requests   Final    BOTTLES DRAWN AEROBIC AND ANAEROBIC Blood Culture results may not be optimal due to an inadequate volume of blood received in culture bottles   Culture   Final    NO GROWTH 3 DAYS Performed at [facility], [address], [place], [place] [address]    Report Status PENDING  Incomplete  Resp panel by RT-PCR (RSV, Flu A&B, Covid) Anterior Nasal Swab     Status: None   Collection Time: 03/29/24  9:36 PM   Specimen: Anterior Nasal Swab  Result Value Ref Range Status   SARS Coronavirus 2 by RT PCR NEGATIVE NEGATIVE Final    Comment: (NOTE) SARS-CoV-2 target nucleic acids are NOT DETECTED.  The SARS-CoV-2 RNA is generally detectable in upper respiratory specimens during the acute phase of infection. The lowest concentration of SARS-CoV-2 viral copies this assay can detect is 138 copies/mL. A negative result does not  preclude SARS-Cov-2 infection and should not be used as the sole basis for treatment or other patient management decisions. A negative result may occur with  improper specimen collection/handling, submission of specimen other than nasopharyngeal swab, presence of viral mutation(s) within the areas targeted by this assay, and inadequate number of viral copies(<138 copies/mL). A negative result must be combined with clinical observations, patient history, and epidemiological information. The expected result is Negative.  Fact Sheet for Patients:  bloggercourse.com  Fact Sheet for Healthcare Providers:  seriousbroker.it  This test is no t yet approved or cleared by the [place]  FDA and  has been authorized for detection and/or diagnosis of SARS-CoV-2 by FDA under an Emergency Use Authorization (EUA). This EUA will remain  in effect (meaning this test can be used) for the duration of the COVID-19 declaration under Section 564(b)(1) of the Act, 21 U.S.C.section 360bbb-3(b)(1), unless the authorization is terminated  or revoked sooner.       Influenza A by PCR NEGATIVE NEGATIVE Final   Influenza B by PCR NEGATIVE NEGATIVE Final    Comment: (NOTE) The Xpert Xpress SARS-CoV-2/FLU/RSV plus assay is intended as an aid in the diagnosis of influenza from Nasopharyngeal swab specimens and should not be used as a sole basis for treatment. Nasal washings and aspirates are unacceptable for Xpert Xpress SARS-CoV-2/FLU/RSV testing.  Fact Sheet for Patients: bloggercourse.com  Fact Sheet for Healthcare Providers: seriousbroker.it  This test is not yet approved or cleared by the [place]  FDA and has been authorized for detection and/or diagnosis of SARS-CoV-2 by FDA under an Emergency Use Authorization (EUA). This EUA will remain in effect (meaning this test can be used) for the  duration of the COVID-19 declaration under Section 564(b)(1) of the Act, 21 U.S.C. section 360bbb-3(b)(1), unless the authorization is terminated or revoked.     Resp Syncytial Virus by PCR NEGATIVE NEGATIVE Final    Comment: (NOTE) Fact Sheet for Patients: bloggercourse.com  Fact Sheet for Healthcare Providers: seriousbroker.it  This test is not yet approved or cleared by the [place]  FDA and has been authorized for detection and/or diagnosis of SARS-CoV-2 by FDA under an Emergency Use Authorization (EUA). This EUA will remain in effect (meaning this  test can be used) for the duration of the COVID-19 declaration under Section 564(b)(1) of the Act, 21 U.S.C. section 360bbb-3(b)(1), unless the authorization is terminated or revoked.  Performed at [facility], [address] [place], [place] [address]      Scheduled Meds:  apixaban   2.5 mg Oral BID   budesonide (PULMICORT) nebulizer solution  0.5 mg Nebulization BID   feeding supplement  237 mL Oral BID BM   ipratropium-albuterol   3 mL Nebulization Q6H   levothyroxine   100 mcg Oral Q0600   OLANZapine zydis  2.5 mg Oral QHS   pantoprazole   40 mg Oral Daily   Continuous Infusions:  cefTRIAXone  (ROCEPHIN )  IV 2 g (03/31/24 2128)    Procedures/Studies: DG CHEST PORT 1 VIEW Result Date: 04/01/2024 EXAM: 1 VIEW(S) XRAY OF THE CHEST 04/01/2024 03:28:00 PM COMPARISON: 03/29/2024 CLINICAL HISTORY: Dyspnea. Pulmonary fibrosis (HCC). FINDINGS: LINES, TUBES AND DEVICES: Left chest cardiac pacing device noted. LUNGS AND PLEURA: Chronic coarsened interstitial markings. Suspected mild atelectasis in the left lower lobe. Unchanged scarring in right mid lung. No pleural effusion. No pneumothorax. HEART AND MEDIASTINUM: Cardiomegaly. Atherosclerotic calcifications. BONES AND SOFT TISSUES: No acute osseous abnormality. IMPRESSION: 1. Chronic coarsened interstitial markings with suspected  mild left lower lobe atelectasis. 2. Unchanged scarring in the right mid lung. 3. Cardiomegaly with atherosclerotic calcifications. Left chest cardiac pacing device present. Electronically signed by: Ryan Salvage MD 04/01/2024 03:53 PM EST RP Workstation: [id number]   DG Hip Unilat W or Wo Pelvis 2-3 Views Left Result Date: 03/29/2024 CLINICAL DATA:  Fall with left-sided hip pain EXAM: DG HIP (WITH OR WITHOUT PELVIS) 2-3V LEFT COMPARISON:  CT 03/29/2024 FINDINGS: Hardware in the lumbo sacral spine. No definitive fracture or malalignment. IMPRESSION: No definite acute osseous abnormality. Electronically Signed   By: Luke Bun M.D.   On: 03/29/2024 22:42   DG Chest Port 1 View Result Date: 03/29/2024 CLINICAL DATA:  Possible sepsis EXAM: PORTABLE CHEST 1 VIEW COMPARISON:  Fall 05/27/2022, CT 03/29/2024, 07/19/2018 FINDINGS: Left-sided pacing device as before. Enlarged cardiomediastinal silhouette with aortic atherosclerosis. Prominent mediastinum, likely due to portable technique and patient rotation. Mild reticular and interstitial opacities, corresponding to scarring on CT. No acute confluent airspace disease. No pleural effusion or pneumothorax. IMPRESSION: No active disease. Cardiomegaly with chronic changes. Electronically Signed   By: Luke Bun M.D.   On: 03/29/2024 22:40   CT CHEST ABDOMEN PELVIS WO CONTRAST Result Date: 03/29/2024 EXAM: CT CHEST, ABDOMEN AND PELVIS WITHOUT CONTRAST 03/29/2024 10:18:20 PM TECHNIQUE: CT of the chest, abdomen and pelvis was performed without the administration of intravenous contrast. Multiplanar reformatted images are provided for review. Automated exposure control, iterative reconstruction, and/or weight based adjustment of the mA/kV was utilized to reduce the radiation dose to as low as reasonably achievable. COMPARISON: CT abdomen/pelvis dated 02/04/2024 and CT chest dated 03/23/2018. CLINICAL HISTORY: Polytrauma, blunt. FINDINGS: CHEST: MEDIASTINUM AND  LYMPH NODES: Mild cardiomegaly. Left subclavian pacemaker. The central airways are clear. No mediastinal, hilar or axillary lymphadenopathy. LUNGS AND PLEURA: Mild subpleural scarring in the lungs bilaterally. No focal consolidation or pulmonary edema. No pleural effusion or pneumothorax. ABDOMEN AND PELVIS: LIVER: The liver is unremarkable. GALLBLADDER AND BILE DUCTS: Gallbladder is unremarkable. No biliary ductal dilatation. SPLEEN: No acute abnormality. PANCREAS: No acute abnormality. ADRENAL GLANDS: No acute abnormality. KIDNEYS, URETERS AND BLADDER: Status post left nephrectomy. No stones in the right kidney or ureter. No right hydronephrosis. No perinephric or periureteral stranding on the right. Thick-walled bladder with mild perivesical stranding (  image 113), similar to the prior, suggesting cystitis. GI AND BOWEL: Stomach demonstrates no acute abnormality. There is no bowel obstruction. REPRODUCTIVE ORGANS: Status post hysterectomy. PERITONEUM AND RETROPERITONEUM: No ascites. No free air. VASCULATURE: Thoracic aortic atherosclerosis. Atherosclerotic calcifications of the abdominal aorta and branch vessels. ABDOMINAL AND PELVIS LYMPH NODES: No lymphadenopathy. BONES AND SOFT TISSUES: Status post PLIF at L4-S1. Mild degenerative changes of the lower thoracic and lumbar spine, most prominent at L2-3. No acute osseous abnormality. No focal soft tissue abnormality. IMPRESSION: 1. No evidence of acute traumatic injury. 2. Thick-walled bladder with mild perivesical stranding, unchanged from prior, compatible with cystitis. 3. Additional stable ancillary findings, as above. Electronically signed by: Pinkie Pebbles MD 03/29/2024 10:24 PM EST RP Workstation: [id number]   CT Cervical Spine Wo Contrast Result Date: 03/29/2024 EXAM: CT CERVICAL SPINE WITHOUT CONTRAST 03/29/2024 10:18:20 PM TECHNIQUE: CT of the cervical spine was performed without the administration of intravenous contrast. Multiplanar reformatted  images are provided for review. Automated exposure control, iterative reconstruction, and/or weight based adjustment of the mA/kV was utilized to reduce the radiation dose to as low as reasonably achievable. COMPARISON: None available. CLINICAL HISTORY: Neck trauma (Age >= 65y) FINDINGS: CERVICAL SPINE: BONES AND ALIGNMENT: No acute fracture or traumatic malalignment. DEGENERATIVE CHANGES: Mild degenerative changes at C4-C5. SOFT TISSUES: No prevertebral soft tissue swelling. IMPRESSION: 1. No acute abnormality of the cervical spine. Electronically signed by: Pinkie Pebbles MD 03/29/2024 10:20 PM EST RP Workstation: [id number]   CT Head Wo Contrast Result Date: 03/29/2024 EXAM: CT HEAD WITHOUT 03/29/2024 10:18:20 PM TECHNIQUE: CT of the head was performed without the administration of intravenous contrast. Automated exposure control, iterative reconstruction, and/or weight based adjustment of the mA/kV was utilized to reduce the radiation dose to as low as reasonably achievable. COMPARISON: None available. CLINICAL HISTORY: Head trauma, intracranial arterial injury suspected. FINDINGS: BRAIN AND VENTRICLES: Global cortical atrophy. Subcortical and periventricular small vessel ischemic changes. Mild intracranial atherosclerosis. No acute intracranial hemorrhage. No mass effect or midline shift. No extra-axial fluid collection. No evidence of acute infarct. No hydrocephalus. ORBITS: No acute abnormality. SINUSES AND MASTOIDS: No acute abnormality. SOFT TISSUES AND SKULL: No acute skull fracture. No acute soft tissue abnormality. IMPRESSION: 1. No acute intracranial abnormality. Electronically signed by: Pinkie Pebbles MD 03/29/2024 10:20 PM EST RP Workstation: [id number]   CUP PACEART REMOTE DEVICE CHECK Result Date: 03/26/2024 PPM Scheduled remote reviewed. Normal device function.  Presenting rhythm: AP-VS.  Battery at RRT as of 03/14/24, known to clinic, generator change scheduled for 04/19/24. Next remote  transmission per protocol. - CS, CVRS   Alm Schneider, DO  [facility]  If 7PM-7AM, please contact night-coverage [email or web address] Password [email or web address] 04/01/2024, 4:30 PM   LOS: 2 days

## 2024-04-02 DIAGNOSIS — J441 Chronic obstructive pulmonary disease with (acute) exacerbation: Secondary | ICD-10-CM | POA: Diagnosis not present

## 2024-04-02 DIAGNOSIS — I5032 Chronic diastolic (congestive) heart failure: Secondary | ICD-10-CM | POA: Diagnosis not present

## 2024-04-02 DIAGNOSIS — G9341 Metabolic encephalopathy: Secondary | ICD-10-CM | POA: Diagnosis not present

## 2024-04-02 DIAGNOSIS — N3001 Acute cystitis with hematuria: Secondary | ICD-10-CM | POA: Diagnosis not present

## 2024-04-02 LAB — BASIC METABOLIC PANEL WITH GFR
Anion gap: 14 (ref 5–15)
BUN: 16 mg/dL (ref 8–23)
CO2: 19 mmol/L — ABNORMAL LOW (ref 22–32)
Calcium: 9.5 mg/dL (ref 8.9–10.3)
Chloride: 109 mmol/L (ref 98–111)
Creatinine, Ser: 1.28 mg/dL — ABNORMAL HIGH (ref 0.44–1.00)
GFR, Estimated: 41 mL/min — ABNORMAL LOW (ref >60)
Glucose, Bld: 165 mg/dL — ABNORMAL HIGH (ref 70–99)
Potassium: 4.1 mmol/L (ref 3.5–5.1)
Sodium: 142 mmol/L (ref 135–145)

## 2024-04-02 LAB — URINE CULTURE: Culture: >=100000 — AB

## 2024-04-02 LAB — MAGNESIUM: Magnesium: 2.2 mg/dL (ref 1.7–2.4)

## 2024-04-02 MED ORDER — ENSURE PLUS HIGH PROTEIN PO LIQD: 237.0000 mL | Freq: Two times a day (BID) | ORAL | Status: AC

## 2024-04-02 MED ORDER — LORAZEPAM 0.5 MG PO TABS: 0.5000 mg | ORAL_TABLET | Freq: Four times a day (QID) | ORAL | 0 refills | Status: DC | PRN

## 2024-04-02 MED ORDER — FUROSEMIDE 10 MG/ML IJ SOLN
40.0000 mg | Freq: Once | INTRAMUSCULAR | Status: AC
  Administered 2024-04-02: 40 mg via INTRAVENOUS
  Filled 2024-04-02: qty 4

## 2024-04-02 MED ORDER — CIPROFLOXACIN HCL 250 MG PO TABS
500.0000 mg | ORAL_TABLET | Freq: Every day | ORAL | Status: DC
  Administered 2024-04-02: 500 mg via ORAL
  Filled 2024-04-02: qty 2

## 2024-04-02 MED ORDER — CEPHALEXIN 250 MG PO CAPS: 250.0000 mg | ORAL_CAPSULE | Freq: Every day | ORAL | Status: DC

## 2024-04-02 MED ORDER — PREDNISONE 10 MG PO TABS: 60.0000 mg | ORAL_TABLET | Freq: Every day | ORAL | Status: AC

## 2024-04-02 MED ORDER — PREDNISONE 20 MG PO TABS: 60.0000 mg | ORAL_TABLET | Freq: Every day | ORAL | Status: DC

## 2024-04-02 MED ORDER — MELATONIN 3 MG PO TABS: 6.0000 mg | ORAL_TABLET | Freq: Every evening | ORAL | Status: AC | PRN

## 2024-04-02 MED ORDER — CIPROFLOXACIN HCL 500 MG PO TABS: 500.0000 mg | ORAL_TABLET | Freq: Every day | ORAL | 0 refills | Status: AC

## 2024-04-02 MED ORDER — LORAZEPAM 0.5 MG PO TABS: 0.5000 mg | ORAL_TABLET | Freq: Four times a day (QID) | ORAL | Status: AC | PRN

## 2024-04-02 NOTE — TOC Transition Note (Signed)
 Transition of Care [facility]) - Discharge Note   Patient Details  Name: Lorraine Leonard MRN: [id number] Date of Birth: October 06, 1940  [facility]) CM/SW Contact:  Noreen KATHEE Cleotilde ISRAEL Phone Number: 04/02/2024, 11:42 AM   Clinical Narrative:     CSW spoke with daughter Al and informed her that patient will DC today to [facility]. CSW shared with daughter what they will need to bring for patient at facility. Room and report information, provided to nurse. Med necessity completed and EMS called.  Final next level of care: Skilled Nursing Facility Barriers to Discharge: Barriers Resolved   Patient Goals and CMS Choice Patient states their goals for this hospitalization and ongoing recovery are:: short term rehab CMS Medicare.gov Compare Post Acute Care list provided to:: Patient Represenative (must comment) (Daughter [name]) Choice offered to / list presented to : Adult Children [facility] ownership interest in [facility].provided to:: Adult Children    Discharge Placement              Patient [facility] at[facility] Patient to be transferred to facility by: EMS Name of family member notified: [name] Patient and family notified of of transfer: 04/02/24  Discharge Plan and Services Additional resources added to the After Visit Summary for                                       Social Drivers of Health (SDOH) Interventions SDOH Screenings   Food Insecurity: Patient Unable To Answer (03/30/2024)  Housing: Patient Unable To Answer (03/30/2024)  Transportation Needs: Patient Unable To Answer (03/30/2024)  Recent Concern: Transportation Needs - Unmet Transportation Needs (02/17/2024)  Utilities: Patient Unable To Answer (03/30/2024)  Alcohol  Screen: Low Risk (02/17/2024)  Depression (PHQ2-9): Low Risk (02/17/2024)  Recent Concern: Depression (PHQ2-9) - High Risk (01/26/2024)  Financial Resource Strain: Low Risk (02/17/2024)  Physical  Activity: Inactive (02/17/2024)  Social Connections: Patient Unable To Answer (03/30/2024)  Recent Concern: Social Connections - Socially Isolated (02/17/2024)  Stress: No Stress Concern Present (02/17/2024)  Recent Concern: Stress - Stress Concern Present (12/29/2023)  Tobacco Use: Low Risk (03/29/2024)  Health Literacy: Adequate Health Literacy (02/17/2024)     Readmission Risk Interventions    04/02/2024   11:40 AM 04/01/2024    2:04 PM 04/01/2024   10:52 AM  Readmission Risk Prevention Plan  Transportation Screening Complete Complete Complete  HRI or Home Care Consult Complete    Social Work Consult for Recovery Care Planning/Counseling Complete    Palliative Care Screening Not Applicable    Medication Review Oceanographer) Complete Complete Complete  HRI or Home Care Consult  Complete Complete  SW Recovery Care/Counseling Consult  Complete Complete  Palliative Care Screening  Complete Complete  Skilled Nursing Facility  Complete Complete

## 2024-04-02 NOTE — Progress Notes (Signed)
°   04/02/24 1229  Medical Necessity for Transport Certificate --- IF THIS TRANSPORT IS ROUND TRIP OR SCHEDULED AND REPEATED, A PHYSICIAN MUST COMPLETE THIS FORM  Transport from: (Location) [facility]  Transport to (Location) Other (Comment) [facility])  Did the patient arrive from a Skilled Nursing Facility, Assisted Living Facility or Group Home? No  Is this the closest appropriate facility? Yes  Date of Transport Service 04/02/24  Name of Transporting Agency [facility]  Round Trip Transport? No  Reason for Transport Discharge  Is this a hospice patient? No  Describe the Medical Condition stable  Q1 Are ALL the following true? 1. Patient unable to get up from bed without assistance  AND  2. Unable to ambulate  AND  3. Unable to sit in a chair, including wheelchair. Yes  Q2 Could the patient be transported safely by other means of transportation (I.E., wheelchair van)? No  Q3 Please check any of the following conditions that apply at the time of transport: Risk of injury to self and/or others  Electronic Signature Noreen Pinal - LCSWA // Patient SSN: [id number]  Credentials SW  Date Signed 04/02/24  Print Form Print

## 2024-04-02 NOTE — Discharge Summary (Addendum)
 Physician Discharge Summary   Patient: Lorraine Leonard MRN: [id number] DOB: [birth year]  Admit date:     03/29/2024  Discharge date: 04/02/2024  Discharge Physician: Alm Kymir Coles   PCP: Lavell Bari LABOR, FNP   Recommendations at discharge:   Please follow up with primary care provider within 1-2 weeks  Please repeat BMP and CBC in one week   Hospital Course: 83 year old female with a history of pulmonary fibrosis/ILD, hypothyroidism, permanent atrial fibrillation status post ablation and PPM, HFpEF, hypertension, hyperlipidemia, and CKD stage IIIb, and chronic respiratory failure on 3 L presenting after being found lethargic by her son.  Per family member, her son [name], via phone, the patient is in hospice care and is a DNR/DNI.  The patient was found at home lethargic.  In the ED, the patient was afebrile with soft blood pressures in the upper 90s and low 100s.  She was hemodynamically stable.  Oxygen  saturation was 98% room air.  WBC 7.1, hemoglobin 12.5, platelets 234.  Sodium 138, 3.7, bicarbonate 20, serum creatinine 1.91.  LFTs were unremarkable.  Lactic acid 2.2>> 1.5.  UA > 50 WBC.  COVID-19 PCR was negative.  CT brain and CT cervical spine were negative for any acute findings.  X-ray of the left pelvis was negative.  Chest x-ray was negative.  The patient was started on IV fluids and ceftriaxone .  On 04/01/24, pt had a little more sob and wheezing.  CXR showed coarsen interstitial markings.  She was felt to have exacerbation of her ILD/COPD.  Patient was started BDs and IV steroids.  Assessment and Plan:  Acute metabolic encephalopathy - Secondary to UTI - Check VBG--7.29/42/<31/20 - B12--654 -folate--13.1 - TSH--4.730 - CK 351 - more alert and conversant, back to baseline 12/12   UTI--Citrobacter freundii -UA>50 WBC -Continue ceftriaxone  pending culture data- - d/c with cipro  x 3 more days after d/c   COPD/pulmonary fibrosis exacerbation -viral respiratory panel -start  duonebs -start brovana -start IV solumedrol>>d/c with prednisone  taper -personally reviewed CXR--chronic coarse interstitial markings   Acute on chronic renal failure--CKD 3B -baseline creatinine 1.1-1.4 -presented with serum creatinine 1.91 -due to volume depletion -continue IVF>>improving   Paroxysmal atrial fibrillation - Status post AV node ablation and PPM - Continue apixaban  - Holding atenolol  secondary to soft blood pressure initially - restart atenolol  as hypertensive again   Hypothyroidism - Continue Synthroid    GERD - Continue pantoprazole    Goals of care - DNR/DNI confirmed with family   Chronic HFpEF - Clinically compensated - 07/23/2022 echo EF 65 to 70%, no WMA, normal RVF - On furosemide  every 48 hours at home--holding temporarily 12/11 VS at 1115--HR 87--RR16-106/63--99%RA - restart home dose furosemide  after d/c   COPD/pulmonary fibrosis/ILD - Stable without exacerbation - previously on home oxygen , - now stable on RA - previously saw Dr. Erle not seen since 08/07/21 - CXR today -12/12--start duonebs, start pulmicort  as pt is having wheezing today - d/c with prednisone  taper -stable on RA   Hypomagnesemia -repleted  Major Neurocognitive disorder -patient's outpt provider Rx ativan  0.5 mg, #60 on 03/23/24 -pt did not require any ativan  during hospitalization -Rx Ativan  0.5 mg, #10, written hard copy prescription directly handed to patient's bedside nurse Philippe Collet, RN at [phone] prior to patient's discharge -d/c haldol as pt has not received any doses during the hospitalization       Consultants: none Procedures performed: none  Disposition: Skilled nursing facility Diet recommendation:  Regular diet DISCHARGE MEDICATION: Allergies as of 04/02/2024  Reactions   Macrodantin  [nitrofurantoin  Macrocrystal] Other (See Comments)   Unknown    Mobic  [meloxicam ] Swelling, Other (See Comments)   Feet and legs swell   Allopurinol   Swelling, Other (See Comments)   Feet and legs swell        Medication List     STOP taking these medications    cefpodoxime 200 MG tablet Commonly known as: VANTIN   haloperidol 1 MG tablet Commonly known as: HALDOL       TAKE these medications    acetaminophen  325 MG tablet Commonly known as: TYLENOL  Take 2 tablets (650 mg total) by mouth every 6 (six) hours as needed for mild pain (pain score 1-3) or fever (or Fever >/= 101).   apixaban  5 MG Tabs tablet Commonly known as: Eliquis  Take 1 tablet (5 mg total) by mouth 2 (two) times daily.   ascorbic acid  500 MG tablet Commonly known as: VITAMIN C  Take 1,000 mg by mouth daily. AM   atenolol  25 MG tablet Commonly known as: TENORMIN  Take 1 tablet (25 mg total) by mouth daily.   cephALEXin  250 MG capsule Commonly known as: KEFLEX  Take 1 capsule (250 mg total) by mouth daily. Restart after finished with course of Cipro  What changed: additional instructions   ciprofloxacin  500 MG tablet Commonly known as: CIPRO  Take 1 tablet (500 mg total) by mouth daily with breakfast. X 3 days   cyanocobalamin  1000 MCG tablet Take 1,000 mcg by mouth daily. Vitamin b12 every AM   feeding supplement Liqd Take 237 mLs by mouth 2 (two) times daily between meals.   fluconazole  150 MG tablet Commonly known as: DIFLUCAN  Take 150 mg by mouth once a week.   fluticasone  50 MCG/ACT nasal spray Commonly known as: FLONASE  PLACE 1 SPRAY INTO BOTH NOSTRILS 2 (TWO) TIMES DAILY AS NEEDED FOR ALLERGIES OR RHINITIS.   furosemide  40 MG tablet Commonly known as: LASIX  Take 1 tablet (40 mg total) by mouth every other day. What changed: when to take this   Iron  (Ferrous Sulfate ) 325 (65 Fe) MG Tabs Take 325 mg by mouth daily.   levocetirizine 5 MG tablet Commonly known as: XYZAL  TAKE 1 TABLET BY MOUTH EVERY DAY IN THE EVENING   levothyroxine  100 MCG tablet Commonly known as: SYNTHROID  TAKE 1 TABLET BY MOUTH EVERY DAY   liver oil-zinc   oxide 40 % ointment Commonly known as: DESITIN Apply 1 Application topically as needed for irritation.   LORazepam  0.5 MG tablet Commonly known as: Ativan  Take 1 tablet (0.5 mg total) by mouth every 6 (six) hours as needed for anxiety.   melatonin 3 MG Tabs tablet Take 2 tablets (6 mg total) by mouth at bedtime as needed. What changed:  medication strength how much to take reasons to take this   Olopatadine  HCl 0.2 % Soln INSTILL 1 DROP INTO BOTH EYES EVERY DAY What changed: See the new instructions.   omeprazole  20 MG capsule Commonly known as: PRILOSEC TAKE 1 CAPSULE BY MOUTH EVERY DAY   predniSONE  10 MG tablet Commonly known as: DELTASONE  Take 6 tablets (60 mg total) by mouth daily with breakfast. And decrease by one tablet daily Start taking on: April 03, 2024   PreserVision AREDS 2 Caps Take 1 capsule by mouth 2 (two) times daily.   rosuvastatin  10 MG tablet Commonly known as: CRESTOR  Take 10 mg by mouth at bedtime.   sodium bicarbonate 650 MG tablet Take 650 mg by mouth daily.        Contact  information for after-discharge care     Destination     [facility] .   Service: Skilled Nursing Contact information: [address] [facility] [place]  [address] [phone]                    Discharge Exam: Filed Weights   03/29/24 2023 03/30/24 0245  Weight: 80 kg 74.6 kg   HEENT:  [place]/AT, No thrush, no icterus CV:  RRR, no rub, no S3, no S4 Lung:  fine bibasilar crackles.  No wheeze Abd:  soft/+BS, NT Ext:  No edema, no lymphangitis, no synovitis, no rash   Condition at discharge: stable  The results of significant diagnostics from this hospitalization (including imaging, microbiology, ancillary and laboratory) are listed below for reference.   Imaging Studies: DG CHEST PORT 1 VIEW Result Date: 04/01/2024 EXAM: 1 VIEW(S) XRAY OF THE CHEST 04/01/2024 03:28:00 PM COMPARISON: 03/29/2024 CLINICAL HISTORY: Dyspnea. Pulmonary  fibrosis (HCC). FINDINGS: LINES, TUBES AND DEVICES: Left chest cardiac pacing device noted. LUNGS AND PLEURA: Chronic coarsened interstitial markings. Suspected mild atelectasis in the left lower lobe. Unchanged scarring in right mid lung. No pleural effusion. No pneumothorax. HEART AND MEDIASTINUM: Cardiomegaly. Atherosclerotic calcifications. BONES AND SOFT TISSUES: No acute osseous abnormality. IMPRESSION: 1. Chronic coarsened interstitial markings with suspected mild left lower lobe atelectasis. 2. Unchanged scarring in the right mid lung. 3. Cardiomegaly with atherosclerotic calcifications. Left chest cardiac pacing device present. Electronically signed by: Ryan Salvage MD 04/01/2024 03:53 PM EST RP Workstation: [id number]   DG Hip Unilat W or Wo Pelvis 2-3 Views Left Result Date: 03/29/2024 CLINICAL DATA:  Fall with left-sided hip pain EXAM: DG HIP (WITH OR WITHOUT PELVIS) 2-3V LEFT COMPARISON:  CT 03/29/2024 FINDINGS: Hardware in the lumbo sacral spine. No definitive fracture or malalignment. IMPRESSION: No definite acute osseous abnormality. Electronically Signed   By: Luke Bun M.D.   On: 03/29/2024 22:42   DG Chest Port 1 View Result Date: 03/29/2024 CLINICAL DATA:  Possible sepsis EXAM: PORTABLE CHEST 1 VIEW COMPARISON:  Fall 05/27/2022, CT 03/29/2024, 07/19/2018 FINDINGS: Left-sided pacing device as before. Enlarged cardiomediastinal silhouette with aortic atherosclerosis. Prominent mediastinum, likely due to portable technique and patient rotation. Mild reticular and interstitial opacities, corresponding to scarring on CT. No acute confluent airspace disease. No pleural effusion or pneumothorax. IMPRESSION: No active disease. Cardiomegaly with chronic changes. Electronically Signed   By: Luke Bun M.D.   On: 03/29/2024 22:40   CT CHEST ABDOMEN PELVIS WO CONTRAST Result Date: 03/29/2024 EXAM: CT CHEST, ABDOMEN AND PELVIS WITHOUT CONTRAST 03/29/2024 10:18:20 PM TECHNIQUE: CT of the  chest, abdomen and pelvis was performed without the administration of intravenous contrast. Multiplanar reformatted images are provided for review. Automated exposure control, iterative reconstruction, and/or weight based adjustment of the mA/kV was utilized to reduce the radiation dose to as low as reasonably achievable. COMPARISON: CT abdomen/pelvis dated 02/04/2024 and CT chest dated 03/23/2018. CLINICAL HISTORY: Polytrauma, blunt. FINDINGS: CHEST: MEDIASTINUM AND LYMPH NODES: Mild cardiomegaly. Left subclavian pacemaker. The central airways are clear. No mediastinal, hilar or axillary lymphadenopathy. LUNGS AND PLEURA: Mild subpleural scarring in the lungs bilaterally. No focal consolidation or pulmonary edema. No pleural effusion or pneumothorax. ABDOMEN AND PELVIS: LIVER: The liver is unremarkable. GALLBLADDER AND BILE DUCTS: Gallbladder is unremarkable. No biliary ductal dilatation. SPLEEN: No acute abnormality. PANCREAS: No acute abnormality. ADRENAL GLANDS: No acute abnormality. KIDNEYS, URETERS AND BLADDER: Status post left nephrectomy. No stones in the right kidney or ureter. No right hydronephrosis. No perinephric  or periureteral stranding on the right. Thick-walled bladder with mild perivesical stranding (image 113), similar to the prior, suggesting cystitis. GI AND BOWEL: Stomach demonstrates no acute abnormality. There is no bowel obstruction. REPRODUCTIVE ORGANS: Status post hysterectomy. PERITONEUM AND RETROPERITONEUM: No ascites. No free air. VASCULATURE: Thoracic aortic atherosclerosis. Atherosclerotic calcifications of the abdominal aorta and branch vessels. ABDOMINAL AND PELVIS LYMPH NODES: No lymphadenopathy. BONES AND SOFT TISSUES: Status post PLIF at L4-S1. Mild degenerative changes of the lower thoracic and lumbar spine, most prominent at L2-3. No acute osseous abnormality. No focal soft tissue abnormality. IMPRESSION: 1. No evidence of acute traumatic injury. 2. Thick-walled bladder with  mild perivesical stranding, unchanged from prior, compatible with cystitis. 3. Additional stable ancillary findings, as above. Electronically signed by: Pinkie Pebbles MD 03/29/2024 10:24 PM EST RP Workstation: [id number]   CT Cervical Spine Wo Contrast Result Date: 03/29/2024 EXAM: CT CERVICAL SPINE WITHOUT CONTRAST 03/29/2024 10:18:20 PM TECHNIQUE: CT of the cervical spine was performed without the administration of intravenous contrast. Multiplanar reformatted images are provided for review. Automated exposure control, iterative reconstruction, and/or weight based adjustment of the mA/kV was utilized to reduce the radiation dose to as low as reasonably achievable. COMPARISON: None available. CLINICAL HISTORY: Neck trauma (Age >= 65y) FINDINGS: CERVICAL SPINE: BONES AND ALIGNMENT: No acute fracture or traumatic malalignment. DEGENERATIVE CHANGES: Mild degenerative changes at C4-C5. SOFT TISSUES: No prevertebral soft tissue swelling. IMPRESSION: 1. No acute abnormality of the cervical spine. Electronically signed by: Pinkie Pebbles MD 03/29/2024 10:20 PM EST RP Workstation: [id number]   CT Head Wo Contrast Result Date: 03/29/2024 EXAM: CT HEAD WITHOUT 03/29/2024 10:18:20 PM TECHNIQUE: CT of the head was performed without the administration of intravenous contrast. Automated exposure control, iterative reconstruction, and/or weight based adjustment of the mA/kV was utilized to reduce the radiation dose to as low as reasonably achievable. COMPARISON: None available. CLINICAL HISTORY: Head trauma, intracranial arterial injury suspected. FINDINGS: BRAIN AND VENTRICLES: Global cortical atrophy. Subcortical and periventricular small vessel ischemic changes. Mild intracranial atherosclerosis. No acute intracranial hemorrhage. No mass effect or midline shift. No extra-axial fluid collection. No evidence of acute infarct. No hydrocephalus. ORBITS: No acute abnormality. SINUSES AND MASTOIDS: No acute abnormality.  SOFT TISSUES AND SKULL: No acute skull fracture. No acute soft tissue abnormality. IMPRESSION: 1. No acute intracranial abnormality. Electronically signed by: Pinkie Pebbles MD 03/29/2024 10:20 PM EST RP Workstation: [id number]   CUP PACEART REMOTE DEVICE CHECK Result Date: 03/26/2024 PPM Scheduled remote reviewed. Normal device function.  Presenting rhythm: AP-VS.  Battery at RRT as of 03/14/24, known to clinic, generator change scheduled for 04/19/24. Next remote transmission per protocol. - CS, CVRS   Microbiology: Results for orders placed or performed during the hospital encounter of 03/29/24  Urine Culture     Status: Abnormal   Collection Time: 03/29/24  8:45 PM   Specimen: Urine, Random  Result Value Ref Range Status   Specimen Description   Final    URINE, RANDOM Performed at [facility], [address] [place], [place] [address]    Special Requests   Final    NONE Reflexed from [id number] Performed at [facility], [address] [place], [place] [address]    Culture >=100,000 COLONIES/mL CITROBACTER FREUNDII (A)  Final   Report Status 04/02/2024 FINAL  Final   Organism ID, Bacteria CITROBACTER FREUNDII (A)  Final      Susceptibility   Citrobacter freundii - MIC*    CEFEPIME 1 SENSITIVE Sensitive     ERTAPENEM 1 INTERMEDIATE  Intermediate     CEFTRIAXONE  >=64 RESISTANT Resistant     CIPROFLOXACIN  0.25 SENSITIVE Sensitive     GENTAMICIN  <=1 SENSITIVE Sensitive     IMIPENEM 1 SENSITIVE Sensitive     NITROFURANTOIN  64 INTERMEDIATE Intermediate     TRIMETH /SULFA  <=20 SENSITIVE Sensitive     PIP/TAZO Value in next row Resistant      >=128 RESISTANTThis is a modified FDA-approved test that has been validated and its performance characteristics determined by the reporting laboratory.  This laboratory is certified under the Clinical Laboratory Improvement Amendments CLIA as qualified to perform high complexity clinical laboratory testing.    MEROPENEM Value in next row Sensitive       >=128 RESISTANTThis is a modified FDA-approved test that has been validated and its performance characteristics determined by the reporting laboratory.  This laboratory is certified under the Clinical Laboratory Improvement Amendments CLIA as qualified to perform high complexity clinical laboratory testing.    AMIKACIN Value in next row Sensitive      >=128 RESISTANTThis is a modified FDA-approved test that has been validated and its performance characteristics determined by the reporting laboratory.  This laboratory is certified under the Clinical Laboratory Improvement Amendments CLIA as qualified to perform high complexity clinical laboratory testing.    CEFTAZIDIME/AVIBACTAM Value in next row Sensitive      >=128 RESISTANTThis is a modified FDA-approved test that has been validated and its performance characteristics determined by the reporting laboratory.  This laboratory is certified under the Clinical Laboratory Improvement Amendments CLIA as qualified to perform high complexity clinical laboratory testing.    CEFTOLOZANE/TAZOBACTAM Value in next row Resistant      >=128 RESISTANTThis is a modified FDA-approved test that has been validated and its performance characteristics determined by the reporting laboratory.  This laboratory is certified under the Clinical Laboratory Improvement Amendments CLIA as qualified to perform high complexity clinical laboratory testing.    MEROPENEM/VABORBACTAM Value in next row Sensitive ug/mL     >=128 RESISTANTThis is a modified FDA-approved test that has been validated and its performance characteristics determined by the reporting laboratory.  This laboratory is certified under the Clinical Laboratory Improvement Amendments CLIA as qualified to perform high complexity clinical laboratory testing.    TOBRAMYCIN Value in next row Sensitive      >=128 RESISTANTThis is a modified FDA-approved test that has been validated and its performance characteristics  determined by the reporting laboratory.  This laboratory is certified under the Clinical Laboratory Improvement Amendments CLIA as qualified to perform high complexity clinical laboratory testing.    * >=100,000 COLONIES/mL CITROBACTER FREUNDII  Blood Culture (routine x 2)     Status: None (Preliminary result)   Collection Time: 03/29/24  9:29 PM   Specimen: BLOOD  Result Value Ref Range Status   Specimen Description BLOOD BLOOD LEFT ARM  Final   Special Requests   Final    BOTTLES DRAWN AEROBIC AND ANAEROBIC Blood Culture adequate volume   Culture   Final    NO GROWTH 4 DAYS Performed at [facility], [address], [place], [place] [address]    Report Status PENDING  Incomplete  Blood Culture (routine x 2)     Status: None (Preliminary result)   Collection Time: 03/29/24  9:30 PM   Specimen: BLOOD  Result Value Ref Range Status   Specimen Description BLOOD BLOOD RIGHT WRIST  Final   Special Requests   Final    BOTTLES DRAWN AEROBIC AND ANAEROBIC Blood Culture results may not  be optimal due to an inadequate volume of blood received in culture bottles   Culture   Final    NO GROWTH 4 DAYS Performed at [facility], [address] [place], [place] [address]    Report Status PENDING  Incomplete  Resp panel by RT-PCR (RSV, Flu A&B, Covid) Anterior Nasal Swab     Status: None   Collection Time: 03/29/24  9:36 PM   Specimen: Anterior Nasal Swab  Result Value Ref Range Status   SARS Coronavirus 2 by RT PCR NEGATIVE NEGATIVE Final    Comment: (NOTE) SARS-CoV-2 target nucleic acids are NOT DETECTED.  The SARS-CoV-2 RNA is generally detectable in upper respiratory specimens during the acute phase of infection. The lowest concentration of SARS-CoV-2 viral copies this assay can detect is 138 copies/mL. A negative result does not preclude SARS-Cov-2 infection and should not be used as the sole basis for treatment or other patient management decisions. A negative result may occur with   improper specimen collection/handling, submission of specimen other than nasopharyngeal swab, presence of viral mutation(s) within the areas targeted by this assay, and inadequate number of viral copies(<138 copies/mL). A negative result must be combined with clinical observations, patient history, and epidemiological information. The expected result is Negative.  Fact Sheet for Patients:  bloggercourse.com  Fact Sheet for Healthcare Providers:  seriousbroker.it  This test is no t yet approved or cleared by the [place]  FDA and  has been authorized for detection and/or diagnosis of SARS-CoV-2 by FDA under an Emergency Use Authorization (EUA). This EUA will remain  in effect (meaning this test can be used) for the duration of the COVID-19 declaration under Section 564(b)(1) of the Act, 21 U.S.C.section 360bbb-3(b)(1), unless the authorization is terminated  or revoked sooner.       Influenza A by PCR NEGATIVE NEGATIVE Final   Influenza B by PCR NEGATIVE NEGATIVE Final    Comment: (NOTE) The Xpert Xpress SARS-CoV-2/FLU/RSV plus assay is intended as an aid in the diagnosis of influenza from Nasopharyngeal swab specimens and should not be used as a sole basis for treatment. Nasal washings and aspirates are unacceptable for Xpert Xpress SARS-CoV-2/FLU/RSV testing.  Fact Sheet for Patients: bloggercourse.com  Fact Sheet for Healthcare Providers: seriousbroker.it  This test is not yet approved or cleared by the [place]  FDA and has been authorized for detection and/or diagnosis of SARS-CoV-2 by FDA under an Emergency Use Authorization (EUA). This EUA will remain in effect (meaning this test can be used) for the duration of the COVID-19 declaration under Section 564(b)(1) of the Act, 21 U.S.C. section 360bbb-3(b)(1), unless the authorization is terminated or revoked.      Resp Syncytial Virus by PCR NEGATIVE NEGATIVE Final    Comment: (NOTE) Fact Sheet for Patients: bloggercourse.com  Fact Sheet for Healthcare Providers: seriousbroker.it  This test is not yet approved or cleared by the [place]  FDA and has been authorized for detection and/or diagnosis of SARS-CoV-2 by FDA under an Emergency Use Authorization (EUA). This EUA will remain in effect (meaning this test can be used) for the duration of the COVID-19 declaration under Section 564(b)(1) of the Act, 21 U.S.C. section 360bbb-3(b)(1), unless the authorization is terminated or revoked.  Performed at [facility], [address] [place], [place] [address]    *Note: Due to a large number of results and/or encounters for the requested time period, some results have not been displayed. A complete set of results can be found in Results Review.  Labs: CBC: Recent Labs  Lab 03/29/24 2105 03/30/24 0411  WBC 7.1 7.0  NEUTROABS  --  3.5  HGB 12.5 10.9*  HCT 38.2 34.8*  MCV 94.1 98.0  PLT 234 231   Basic Metabolic Panel: Recent Labs  Lab 03/29/24 2031 03/30/24 0411 03/31/24 0612 04/01/24 0558 04/02/24 0601  NA 138 140 143 142 142  K 3.7 3.6 3.5 3.6 4.1  CL 105 111 111 110 109  CO2 20* 16* 20* 22 19*  GLUCOSE 148* 123* 134* 114* 165*  BUN 26* 23 21 17 16   CREATININE 1.91* 1.64* 1.56* 1.33* 1.28*  CALCIUM  9.9 8.6* 9.3 9.2 9.5  MG  --  1.9 1.6* 2.3 2.2  PHOS  --  2.8  --   --   --    Liver Function Tests: Recent Labs  Lab 03/29/24 2031 03/30/24 0411  AST 46* 41  ALT 14 12  ALKPHOS 79 72  BILITOT 0.7 0.5  PROT 6.8 5.8*  ALBUMIN 3.6 3.2*   CBG: No results for input(s): GLUCAP in the last 168 hours.  Discharge time spent: greater than 30 minutes.  Signed: Alm Schneider, MD [facility] 04/02/2024

## 2024-04-02 NOTE — Plan of Care (Signed)
°  Problem: Education: °Goal: Knowledge of General Education information will improve °Description: Including pain rating scale, medication(s)/side effects and non-pharmacologic comfort measures °Outcome: Progressing °  °Problem: Activity: °Goal: Risk for activity intolerance will decrease °Outcome: Progressing °  °Problem: Nutrition: °Goal: Adequate nutrition will be maintained °Outcome: Progressing °  °Problem: Coping: °Goal: Level of anxiety will decrease °Outcome: Progressing °  °Problem: Elimination: °Goal: Will not experience complications related to bowel motility °Outcome: Progressing °Goal: Will not experience complications related to urinary retention °Outcome: Progressing °  °Problem: Safety: °Goal: Ability to remain free from injury will improve °Outcome: Progressing °  °

## 2024-04-03 LAB — CULTURE, BLOOD (ROUTINE X 2)
Culture: NO GROWTH
Culture: NO GROWTH
Special Requests: ADEQUATE

## 2024-04-07 NOTE — Telephone Encounter (Signed)
 I called pt's Daughter and she suggested that we postpone the Gen Change a little longer. The patient has developed dementia and will not be able to go home alone. They have a lot to get worked out with her right now and would like to give it a little more time to figure things out.   I will cancel her 12/30 PPM Gen Change with Dr. Waddell and she will need to be scheduled with another MD since he will be retiring before she is rescheduled. Pt's Daughter is aware of this.   I informed the Daughter that I would speak with Dr. Waddell and get his input on who to schedule her with and find out if she needs a visit prior to the procedure. She currently has an appt with Daphne Barrack, NP on 1/20.  Pt reached ERI on 11/24.SABRASABRA

## 2024-04-18 LAB — LAB REPORT - SCANNED: EGFR: 41

## 2024-04-19 ENCOUNTER — Ambulatory Visit (HOSPITAL_COMMUNITY): Admit: 2024-04-19 | Admitting: Internal Medicine

## 2024-04-19 ENCOUNTER — Encounter (HOSPITAL_COMMUNITY): Payer: Self-pay

## 2024-04-19 SURGERY — PPM GENERATOR CHANGEOUT
Anesthesia: LOCAL

## 2024-04-24 ENCOUNTER — Ambulatory Visit

## 2024-04-25 ENCOUNTER — Encounter

## 2024-04-27 ENCOUNTER — Telehealth: Payer: Self-pay

## 2024-04-27 DIAGNOSIS — N390 Urinary tract infection, site not specified: Secondary | ICD-10-CM

## 2024-04-27 MED ORDER — CEPHALEXIN 250 MG PO CAPS: 250.0000 mg | ORAL_CAPSULE | Freq: Every day | ORAL | 5 refills | Status: AC

## 2024-04-27 NOTE — Transitions of Care (Post Inpatient/ED Visit) (Signed)
" ° °  04/27/2024  Name: Lorraine Leonard MRN: [id number] DOB: January 06, 1941  Today's TOC FU Call Status: Today's TOC FU Call Status:: Unsuccessful Call (1st Attempt) Unsuccessful Call (1st Attempt) Date: 04/27/24  Attempted to reach the patient regarding the most recent Inpatient/ED visit.  Follow Up Plan: Additional outreach attempts will be made to reach the patient to complete the Transitions of Care (Post Inpatient/ED visit) call.   Signature Julian Lemmings, LPN [facility] Nurse Health Advisor Direct Dial [phone]  "

## 2024-04-27 NOTE — Telephone Encounter (Signed)
 Made aware and voiced understanding

## 2024-04-27 NOTE — Telephone Encounter (Signed)
-----   Message from Belvie Clara, MD sent at 04/26/2024  8:18 AM EST ----- Please refill ----- Message ----- From: Gretta Carlos SAUNDERS, CMA Sent: 03/25/2024   3:41 PM EST To: Belvie LITTIE Clara, MD  See attach and refill if appropriate

## 2024-04-27 NOTE — Telephone Encounter (Signed)
 Copied from CRM [id number]. Topic: General - Other >> Apr 27, 2024 10:21 AM Rosaria BRAVO wrote: Reason for CRM: Pt's daughter Al says feel free to call her. Best contact: [phone]   Says pt just discharged from the hospital, had a brain bleed. They currently have hospice services, the patient is showing signs of dementia. They have a NP coming to the home now.

## 2024-04-28 NOTE — Transitions of Care (Post Inpatient/ED Visit) (Signed)
" ° °  04/28/2024  Name: Lorraine Leonard MRN: [id number] DOB: [birth year]  Today's TOC FU Call Status: Today's TOC FU Call Status:: Unsuccessful Call (2nd Attempt) Unsuccessful Call (1st Attempt) Date: 04/27/24 Unsuccessful Call (2nd Attempt) Date: 04/28/24  Attempted to reach the patient regarding the most recent Inpatient/ED visit.  Follow Up Plan: Additional outreach attempts will be made to reach the patient to complete the Transitions of Care (Post Inpatient/ED visit) call.   Signature Nikki M,CMA "

## 2024-04-29 ENCOUNTER — Telehealth: Payer: Self-pay

## 2024-04-29 ENCOUNTER — Ambulatory Visit: Payer: Self-pay | Admitting: Family

## 2024-04-29 NOTE — Transitions of Care (Post Inpatient/ED Visit) (Signed)
" ° °  04/29/2024  Name: Lorraine Leonard MRN: [id number] DOB: 08-02-1940  Today's TOC FU Call Status: Today's TOC FU Call Status:: Unsuccessful Call (3rd Attempt) Unsuccessful Call (1st Attempt) Date: 04/27/24 Unsuccessful Call (2nd Attempt) Date: 04/28/24 Unsuccessful Call (3rd Attempt) Date: 04/29/24  Attempted to reach the patient regarding the most recent Inpatient/ED visit.  Follow Up Plan: No further outreach attempts will be made at this time. We have been unable to contact the patient.  Signature Julian Lemmings, LPN [facility] Nurse Health Advisor Direct Dial [phone]  "

## 2024-04-29 NOTE — Telephone Encounter (Signed)
 I called and spoke with pt's Daughter to get her scheduled for her PPM Gen Change - she reached ERI on 11/24. She was originally scheduled with Danelle Birmingham, MD on 12/30 but had to cancel due to being hospitalized with severe UTI.  Since then the pt has had an incident that caused a brain bleed. She also now has Dementia. She is currently living at home but her Daughter is staying there with her.  [facility] RN is coming out to the home to help with her and acting as her PCP at this time. They have told her that she doesn't need to f/u with any of her Physicians at this time unless she needs to have her PPM changed out. They are questioning if she really needs to have this procedure done.   The Family wants this done if she can't live without it. They don't want her to suffer or pass if that would happen without it.  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  She is scheduled to see Daphne Barrack, NP on 1/20 but the Daughter is asking if she has to come in the office or can the procedure just be scheduled. I explained to her that it may be best that she be seen with everything she has going on but that would be up to you.   Can you please review her chart and let me know what you think??  Daughter's name GLENWOOD Blackwood [phone]

## 2024-05-05 NOTE — Telephone Encounter (Signed)
 I called Pt's Daughter - she stated that she had left Dr. Almetta a message on his phone stating that they could not do the procedure this Thursday.   I offered her next week 1/29, she is checking with her Brother (he will be the one to bring them) and she will let me or Dr. Almetta know for sure tomorrow.

## 2024-05-09 NOTE — Telephone Encounter (Signed)
 Daughter [name]) returned staff call to schedule patient's surgery this week. Daughter stated can leave voice message and she will call right back.

## 2024-05-09 NOTE — Telephone Encounter (Signed)
 I returned call to pt's Daughter and scheduled her for PPM Gen change with Dr. Almetta on 1/22 at 11:30 am. She will not need updated labs.   I went over all details of procedure. There are a few med changes she reported...  She only takes Lasix  on M,W & Friday. Holding Eliquis  since 12/29 (due to bleeding) Currently taking seizure medication - Levetiracetam 500mg /BID.  I informed her that she needs to be NPO after midnight but can have liquids to take her medications. She will drink a meal replacement drink with her medications. She would like for me to ask Dr. Almetta if she can have a piece of toast that morning. I will call her back with his response.

## 2024-05-10 ENCOUNTER — Ambulatory Visit: Admitting: Pulmonary Disease

## 2024-05-11 NOTE — Pre-Procedure Instructions (Signed)
 Instructed patient on the following items: Arrival time 0930 Nothing to eat or drink after midnight No meds AM of procedure Responsible person to drive you home and stay with you for 24 hrs Wash with special soap night before and morning of procedure If on anti-coagulant drug instructions Eliquis - last dose 12/29

## 2024-05-12 ENCOUNTER — Encounter (HOSPITAL_COMMUNITY)
Admission: RE | Disposition: A | Payer: Self-pay | Source: Home / Self Care | Attending: Student in an Organized Health Care Education/Training Program

## 2024-05-12 ENCOUNTER — Other Ambulatory Visit: Payer: Self-pay

## 2024-05-12 ENCOUNTER — Encounter (HOSPITAL_COMMUNITY): Payer: Self-pay | Admitting: Student in an Organized Health Care Education/Training Program

## 2024-05-12 ENCOUNTER — Ambulatory Visit (HOSPITAL_COMMUNITY)
Admission: RE | Admit: 2024-05-12 | Discharge: 2024-05-12 | Disposition: A | Discharge status: Home / Self Care | Attending: Student in an Organized Health Care Education/Training Program | Admitting: Student in an Organized Health Care Education/Training Program

## 2024-05-12 DIAGNOSIS — I5032 Chronic diastolic (congestive) heart failure: Secondary | ICD-10-CM | POA: Insufficient documentation

## 2024-05-12 DIAGNOSIS — I130 Hypertensive heart and chronic kidney disease with heart failure and stage 1 through stage 4 chronic kidney disease, or unspecified chronic kidney disease: Secondary | ICD-10-CM | POA: Insufficient documentation

## 2024-05-12 DIAGNOSIS — I442 Atrioventricular block, complete: Secondary | ICD-10-CM | POA: Insufficient documentation

## 2024-05-12 DIAGNOSIS — Z45010 Encounter for checking and testing of cardiac pacemaker pulse generator [battery]: Secondary | ICD-10-CM | POA: Diagnosis present

## 2024-05-12 DIAGNOSIS — N1831 Chronic kidney disease, stage 3a: Secondary | ICD-10-CM | POA: Diagnosis not present

## 2024-05-12 DIAGNOSIS — I4821 Permanent atrial fibrillation: Secondary | ICD-10-CM | POA: Diagnosis not present

## 2024-05-12 LAB — GLUCOSE, CAPILLARY
Glucose-Capillary: 150 mg/dL — ABNORMAL HIGH (ref 70–99)
Glucose-Capillary: 163 mg/dL — ABNORMAL HIGH (ref 70–99)

## 2024-05-12 MED ORDER — CEFAZOLIN SODIUM-DEXTROSE 2-4 GM/100ML-% IV SOLN: 2.0000 g | INTRAVENOUS | Status: AC

## 2024-05-12 MED ORDER — LIDOCAINE HCL (PF) 1 % IJ SOLN
INTRAMUSCULAR | Status: AC
  Filled 2024-05-12: qty 60

## 2024-05-12 MED ORDER — CEFAZOLIN SODIUM-DEXTROSE 2-4 GM/100ML-% IV SOLN
INTRAVENOUS | Status: AC
  Administered 2024-05-12: 2 g via INTRAVENOUS
  Filled 2024-05-12: qty 100

## 2024-05-12 MED ORDER — SODIUM CHLORIDE 0.9 % IV SOLN
INTRAVENOUS | Status: AC
  Administered 2024-05-12: 80 mg
  Filled 2024-05-12: qty 2

## 2024-05-12 MED ORDER — POVIDONE-IODINE 10 % EX SWAB: 2.0000 | Freq: Once | CUTANEOUS | Status: DC

## 2024-05-12 MED ORDER — SODIUM CHLORIDE 0.9 % IV SOLN: 80.0000 mg | INTRAVENOUS | Status: AC

## 2024-05-12 MED ORDER — SODIUM CHLORIDE 0.9 % IV SOLN: INTRAVENOUS | Status: DC

## 2024-05-12 MED ORDER — LIDOCAINE HCL (PF) 1 % IJ SOLN
INTRAMUSCULAR | Status: DC | PRN
  Administered 2024-05-12: 30 mL

## 2024-05-12 MED ORDER — CHLORHEXIDINE GLUCONATE 4 % EX SOLN
4.0000 | Freq: Once | CUTANEOUS | Status: DC
  Filled 2024-05-12: qty 60

## 2024-05-12 NOTE — Interval H&P Note (Signed)
 History and Physical Interval Note:  05/12/2024 1:44 PM  Lorraine Leonard  has presented today for surgery, with the diagnosis of eri.  The various methods of treatment have been discussed with the patient and family. After consideration of risks, benefits and other options for treatment, the patient has consented to  Procedures: PPM GENERATOR CHANGEOUT (N/A) as a surgical intervention.  The patient's history has been reviewed, patient examined, no change in status, stable for surgery.  I have reviewed the patient's chart and labs.  Questions were answered to the patient's satisfaction.     Lorraine Leonard

## 2024-05-12 NOTE — Discharge Instructions (Signed)

## 2024-05-12 NOTE — Op Note (Signed)
" ° °  Procedure:  LEFT sided MDT DC PPM generator change  Pre-op Diagnosis: permanent AF, CHB    Post-op Diagnosis: same  Procedure Date: 05/12/24  Attending: [name] Primus, MD   Anesthesia: monitored anesthesia care  Procedure Report:  The LEFT shoulder was prepped with chlorhexidine  scrub. 20 cc lidocaine  was injected at the planned incision site. The subcutaneous tissue was dissected with electrocautery and blunt dissection. The pocket overlying the device was dissected to allow for removal of the device. The leads were then removed from the implanted device.  The new device was then connected to the existing leads. The pocket was irrigated with gentamicin  solution. A TYRX pouch was used.  The pocket was closed with continuous 2-0 and 3-0 StrataFix. Dermabond was used to close the superficial layer.    The device was evaluated by the representative of the cardiac device manufacturer under the supervision of the attending physician with implant parameters listed below.   Complications: none Estimated blood loss: less than 50 mL  Implanted Hardware: Implant Name Type Inv. Item Serial No. Manufacturer Lot No. LRB No. Used Action  POUCH AIGIS-R ANTIBACT PPM MED - [id number] Mesh General POUCH AIGIS-R ANTIBACT PPM MED  [employer] [id number] N/A 1 Implanted  IPG PACE AZUR XT DR MRI T8IM98 - [id number] G Pacemaker IPG PACE AZUR XT DR MRI T8IM98 [id number] G [employer]  N/A 1 Implanted  MERM [id number] AZURE XT DR MRI [id number] H   [id number] H [employer] RHYTHM [employer]NAG[employer]   1 Explanted   Device Information: PPM Generator: MDT Azure XT DR MRI SureScan [id number], SN [id number], DOI 05/12/24 RA/His: MDT 6169-30, SN [id number], DOI 05/18/17 RV: MDT 4923-41, SN [id number], DOI 05/18/17  Lead Interrogation Data: RA/His: none / 399 ohms / 2.0 V @ 1.0 ms RV: none / 399 ohms / 1.0 V @ 0.4 ms  Summary: 1. Successful LEFT MDT DC PPM generator replacement    Recommendations: Routine post-procedure care with bedrest for 3 hours No heparin  (IV or subcutaneous) for 48 hours. No enoxaparin  (IV or subcutaneous) for 7 days.  Discharge home post procedure.   Lorraine DELENA Primus, MD [facility]  Cardiac Electrophysiology      "

## 2024-05-12 NOTE — H&P (Signed)
 "  HPI Lorraine Leonard returns today for followup. She is a chronically ill 84 yo woman with a h/o chronic diastolic heart failure, uncontrolled atrial fib s/p AV node ablation and PPM insertion. She notes that she has had gradually improved. Her fatigue and dyspnea remain but have gotten better since we got her rate controlled. She is sedentary. She has lost 10 lbs in the interim. She notes that she feels ok. She is using her walker to get around. No falls.      Past Medical History:  Diagnosis Date   Allergic rhinitis     Anxiety     Anxiety     Arthritis      ~ all my joints (05/18/2017)   Atrial fibrillation (HCC) 09/2016   Atrial fibrillation with RVR (HCC) 05/18/2017   Chronic heart failure with preserved ejection fraction (HFpEF) (HCC)     Chronic kidney disease, stage 3a (HCC)     Chronic lower back pain     COPD (chronic obstructive pulmonary disease) (HCC)     Depression     Diverticulosis     GERD (gastroesophageal reflux disease)     Glaucoma     Gout     History of blood transfusion 1983    when I gave a 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   Hyperlipidemia     Hypertension     Hypothyroidism     Macular degeneration of both eyes     Neuropathy     On home oxygen  therapy      3L; 24/7 (05/18/2017)   Pre-diabetes     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          Past Surgical History:  Procedure Laterality Date   AV NODE ABLATION   05/18/2017   AV NODE ABLATION N/A 05/18/2017    Procedure: AV NODE ABLATION;  Surgeon: Waddell Danelle ORN, MD;  Location: [facility] INVASIVE CV LAB;  Service: Cardiovascular;  Laterality: N/A;   BACK SURGERY       BREAST LUMPECTOMY Left     CARDIOVERSION N/A 01/30/2017    Procedure: CARDIOVERSION;  Surgeon: Delford Maude BROCKS, MD;  Location: [facility]  ENDO SUITE;  Service: Cardiovascular;  Laterality: N/A;   CARDIOVERSION N/A 04/09/2017    Procedure: CARDIOVERSION;  Surgeon: Debera Jayson MATSU, MD;  Location: [facility] ENDO SUITE;  Service: Cardiovascular;  Laterality: N/A;   CATARACT EXTRACTION W/ INTRAOCULAR LENS  IMPLANT, BILATERAL Bilateral     DILATION AND CURETTAGE OF UTERUS       EUS N/A 10/03/2015    Procedure: UPPER ENDOSCOPIC ULTRASOUND (EUS) RADIAL;  Surgeon: Elsie Cree, MD;  Location: [facility] ENDOSCOPY;  Service: Endoscopy;  Laterality: N/A;   FINE NEEDLE ASPIRATION N/A 10/03/2015    Procedure: FINE NEEDLE ASPIRATION (FNA) RADIAL;  Surgeon: Elsie Cree, MD;  Location: [facility] ENDOSCOPY;  Service: Endoscopy;  Laterality: N/A;   INSERT / REPLACE / REMOVE PACEMAKER   05/18/2017   LUMBAR LAMINECTOMY   1995; 06/23/2008    L4-5/notes 08/20/2010   NEPHRECTOMY LIVING DONOR   1983    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   NEPHRECTOMY TRANSPLANTED ORGAN  PACEMAKER IMPLANT N/A 05/18/2017    Procedure: PACEMAKER IMPLANT;  Surgeon: Waddell Danelle ORN, MD;  [facility] IN[facility] LAB;  Service: Cardiovascular;  Laterality: N/A;   TOTAL ABDOMINAL HYSTERECTOMY       TUBAL LIGATION                     Family History  Problem Relation Age of Onset   Diabetes Other     Hypertension Other     Coronary artery disease Other     Stroke Other     Asthma Other     Kidney disease Sister     Cancer Sister          lung   Lung cancer Sister     Cancer Sister          lung   Lung cancer Sister     Cancer Sister          lung   Suicidality Son          committed suicide in 2009   CVA Son     Hyperlipidemia Other     Anxiety disorder Other     Migraines Other     Heart failure Father     Healthy Brother     Cancer Sister          lung   Healthy Sister            Social History         Socioeconomic History   Marital status: Widowed      Spouse name: Tamiah Dysart   Number of children: 4   Years of education: 9   Highest education level: 9th  grade  Occupational History   Occupation: retired      Comment: cna  Tobacco Use   Smoking status: Never      Passive exposure: Never   Smokeless tobacco: Never  Vaping Use   Vaping status: Never Used  Substance and Sexual Activity   Alcohol  use: No   Drug use: No   Sexual activity: Not Currently      Partners: Male      Birth control/protection: Surgical  Other Topics Concern   Not on file  Social History Narrative    Pt ONLY HAS ONE KIDNEY, 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    Pt lives alone in single wide trailer. They have 4 children and 13 grandchildren.     Social Drivers of Acupuncturist Strain: Low Risk  (02/17/2024)    Overall Financial Resource Strain (CARDIA)     Difficulty of Paying Living Expenses: Not very hard  Food Insecurity: No Food Insecurity (02/17/2024)    Hunger Vital Sign     Worried About Running Out of Food in the Last Year: Never true     Ran Out of Food in the Last Year: Never true  Recent Concern: Food Insecurity - Food Insecurity Present (12/29/2023)    Hunger Vital Sign     Worried About Running Out of Food in the Last Year: Sometimes true     Ran Out of Food in the Last Year: Never true  Transportation Needs: Unmet Transportation Needs (02/17/2024)    PRAPARE - Therapist, Art (Medical): Yes     Lack of Transportation (Non-Medical): No  Physical Activity: Inactive (02/17/2024)    Exercise Vital Sign     Days of  Exercise per Week: 0 days     Minutes of Exercise per Session: 0 min  Stress: No Stress Concern Present (02/17/2024)    [employer] of Occupational Health - Occupational Stress Questionnaire     Feeling of Stress: Not at all  Recent Concern: Stress - Stress Concern Present (12/29/2023)    [employer] of Occupational Health - Occupational Stress Questionnaire     Feeling of Stress: To some extent  Social Connections: Socially Isolated (02/17/2024)    Social  Connection and Isolation Panel     Frequency of Communication with Friends and Family: More than three times a week     Frequency of Social Gatherings with Friends and Family: More than three times a week     Attends Religious Services: Never     Database Administrator or Organizations: No     Attends Banker Meetings: Never     Marital Status: Widowed  Intimate Partner Violence: Not At Risk (02/17/2024)    Humiliation, Afraid, Rape, and Kick questionnaire     Fear of Current or Ex-Partner: No     Emotionally Abused: No     Physically Abused: No     Sexually Abused: No     BP (!) 170/54   Pulse 64   Temp 97.6 F (36.4 C) (Oral)   Resp 17   Ht 5' 7 (1.702 m)   Wt 74.4 kg   SpO2 97%   BMI 25.69 kg/m   Physical Exam:   Well appearing NAD HEENT: Unremarkable Neck:  No JVD, no thyromegally Lymphatics:  No adenopathy Back:  No CVA tenderness Lungs:  Clear HEART:  Regular rate rhythm, no murmurs, no rubs, no clicks Abd:  soft, positive bowel sounds, no organomegally, no rebound, no guarding Ext:  2 plus pulses, no edema, no cyanosis, no clubbing Skin:  No rashes no nodules Neuro:  CN II through XII intact, motor grossly intact   EKG - AF with VP   DEVICE  Normal device function.  See PaceArt for details.    Assess/Plan:  Presents for gen change with perm AF and AVNA. Device dependent.  "

## 2024-05-13 ENCOUNTER — Telehealth: Payer: Self-pay | Admitting: Licensed Clinical Social Worker

## 2024-05-13 NOTE — Telephone Encounter (Signed)
 H&V Care Navigation CSW Progress Note  Clinical Social Worker contacted caregiver by phone to f/u on concerns. Called Al Solian at [phone]. No answer, left voicemail, will re-attempt again as able.  Patient is participating in a Managed Medicaid Plan:  No, UHC Medicare Dual Complete  SDOH Screenings   Food Insecurity: Patient Unable To Answer (03/30/2024)  Housing: Patient Unable To Answer (03/30/2024)  Transportation Needs: Patient Unable To Answer (03/30/2024)  Recent Concern: Transportation Needs - Unmet Transportation Needs (02/17/2024)  Utilities: Patient Unable To Answer (03/30/2024)  Alcohol  Screen: Low Risk (02/17/2024)  Depression (PHQ2-9): Low Risk (02/17/2024)  Recent Concern: Depression (PHQ2-9) - High Risk (01/26/2024)  Financial Resource Strain: Low Risk (02/17/2024)  Physical Activity: Inactive (02/17/2024)  Social Connections: Patient Unable To Answer (03/30/2024)  Recent Concern: Social Connections - Socially Isolated (02/17/2024)  Stress: No Stress Concern Present (02/17/2024)  Recent Concern: Stress - Stress Concern Present (12/29/2023)  Tobacco Use: Low Risk (05/12/2024)  Health Literacy: Adequate Health Literacy (02/17/2024)    Marit Lark, MSW, LCSW Clinical Social Worker II [facility] Health Heart/Vascular Care Navigation  [phone]- work cell phone (preferred)

## 2024-05-17 ENCOUNTER — Telehealth: Payer: Self-pay | Admitting: Licensed Clinical Social Worker

## 2024-05-17 NOTE — Telephone Encounter (Signed)
 H&V Care Navigation CSW Progress Note  Clinical Social Worker contacted caregiver by phone to f/u on concerns. Called Al Solian at [phone]. No answer, left 2nd voicemail, will re-attempt again as able.   Patient is participating in a Managed Medicaid Plan:  No, UHC Medicare Dual Complete only  SDOH Screenings   Food Insecurity: Patient Unable To Answer (03/30/2024)  Housing: Patient Unable To Answer (03/30/2024)  Transportation Needs: Patient Unable To Answer (03/30/2024)  Recent Concern: Transportation Needs - Unmet Transportation Needs (02/17/2024)  Utilities: Patient Unable To Answer (03/30/2024)  Alcohol  Screen: Low Risk (02/17/2024)  Depression (PHQ2-9): Low Risk (02/17/2024)  Recent Concern: Depression (PHQ2-9) - High Risk (01/26/2024)  Financial Resource Strain: Low Risk (02/17/2024)  Physical Activity: Inactive (02/17/2024)  Social Connections: Patient Unable To Answer (03/30/2024)  Recent Concern: Social Connections - Socially Isolated (02/17/2024)  Stress: No Stress Concern Present (02/17/2024)  Recent Concern: Stress - Stress Concern Present (12/29/2023)  Tobacco Use: Low Risk (05/12/2024)  Health Literacy: Adequate Health Literacy (02/17/2024)    Marit Lark, MSW, LCSW Clinical Social Worker II [facility] Health Heart/Vascular Care Navigation  [phone]- work cell phone (preferred)

## 2024-05-18 ENCOUNTER — Encounter (HOSPITAL_COMMUNITY): Payer: Self-pay | Admitting: Student in an Organized Health Care Education/Training Program

## 2024-05-19 ENCOUNTER — Ambulatory Visit: Admitting: Urology

## 2024-05-19 ENCOUNTER — Telehealth (HOSPITAL_BASED_OUTPATIENT_CLINIC_OR_DEPARTMENT_OTHER): Payer: Self-pay | Admitting: Licensed Clinical Social Worker

## 2024-05-19 NOTE — Progress Notes (Signed)
 " Heart and Vascular Care Navigation  05/19/2024  Lorraine Leonard [birth year] [id number]  Reason for Referral: in home services   Engaged with caregiver (pt daughter Al- DPR on file) for initial visit for Heart and Vascular Care Coordination.                                                                                                   Assessment:                                     LCSW was able to reach pt daughter today at [phone] [name], DPR on file). Introduced self, role, reason for call. Pt previously living at home alone with assistance from her son who lives nearby. Pt daughter resides in [place] , thankfully has had flexibility with her job and will be there through next week to ensure things are coordinated for pt.   Pt active with [facility], now called [facility], for Primary Care, aide for baths and has a social worker Beverley who has started paperwork for additional in home care. She gives me permission to speak with him.   Otherwise no concerns related to medication access (except current challenges with weather), food, utilities or housing. Encouraged her to call me should additional concerns arise.   HRT/VAS Care Coordination     Patients Home Cardiology Office [facility]   Outpatient Care Team Social Worker   Social Worker Name: Marit Lark, [place], [phone]   Living arrangements for the past 2 months Single Family Home   Lives with: Self   Patient Current Insurance Coverage Managed Medicare; Medicaid   Patient Has Concern With Paying Medical Bills No   Does Patient Have Prescription Coverage? Yes   Home Assistive Devices/Equipment CBG Meter; Eyeglasses; Bedside commode/3-in-1; Blood pressure cuff; Cane (specify quad or straight); Grab bars around toilet; Grab bars in shower; Oxygen ; Reacher; Walker (specify type); Wheelchair; Scales   DME Agency NA   HH Agency [facility]   Current home services [facility]osp[facility]        Social History:                                                                             SDOH Screenings   Food Insecurity: No Food Insecurity (05/19/2024)  Housing: Low Risk (05/19/2024)  Transportation Needs: No Transportation Needs (05/19/2024)  Utilities: Not At Risk (05/19/2024)  Alcohol  Screen: Low Risk (02/17/2024)  Depression (PHQ2-9): Low Risk (02/17/2024)  Recent Concern: Depression (PHQ2-9) - High Risk (01/26/2024)  Financial Resource Strain: Low Risk (05/19/2024)  Physical Activity: Inactive (02/17/2024)  Social Connections: Patient Unable To Answer (03/30/2024)  Recent Concern: Social Connections - Socially Isolated (02/17/2024)  Stress: No Stress Concern  Present (02/17/2024)  Recent Concern: Stress - Stress Concern Present (12/29/2023)  Tobacco Use: Low Risk (05/12/2024)  Health Literacy: Inadequate Health Literacy (05/19/2024)    SDOH Interventions: Financial Resources:  Financial Strain Interventions: Intervention Not Indicated  Food Insecurity:  Food Insecurity Interventions: Intervention Not Indicated  Housing Insecurity:  Housing Interventions: Intervention Not Indicated  Transportation:   Transportation Interventions: Patient Resources Dietitian)    Other Care Navigation Interventions:     Provided Pharmacy assistance resources  Denies any issues getting medications- but does have some delays this week due to weather  Patient expressed Mental Health concerns No.  Patient Referred to: F/u call made to hospice social worker   Follow-up plan:   LCSW called and left a message for [facility] ([facility]) social worker Beverley to understand what services they have for pt in home and any additional collaboration we can provide. Encouraged pt daughter to reach out to me if needed.     "

## 2024-05-24 ENCOUNTER — Telehealth: Payer: Self-pay | Admitting: Licensed Clinical Social Worker

## 2024-05-24 ENCOUNTER — Telehealth: Payer: Self-pay | Admitting: Emergency Medicine

## 2024-05-24 NOTE — Telephone Encounter (Signed)
 Patients daughter Al called stating she needs to cancel patients 2/4 wound check d/t weather as well as -   -Patient is under hospice care. Patients hospice nurse, Amy, comes to look at wound every week. Says it is looking good. -There has been a death in patients family - patients son.  -Patient is not doing well since sons death. Patients daughter Al unsure how long pt will last since this makes the 2nd son patient has lost. -Al will call to r/s if she think the patient can physically make it.  I requested she give hospice nurse Amy my direct contact to speak with the device clinic regarding patients wound since patient is unable to make it.

## 2024-05-24 NOTE — Telephone Encounter (Signed)
 Spoke with Dr. Almetta in clinic today who said it's okay for patient to not come in for wound check given hospice nurse assessing at home with patient and also no real need given patient's physical status to have them return for a 90 post op visit either. Dr. JAYSON. said he will reach out to the family to discuss all of this and also discuss need for continuing remotes or not.  MD will follow up with device RN team and notify what treatment plan moving forward will be after speaking with family.

## 2024-05-25 ENCOUNTER — Ambulatory Visit

## 2024-05-25 NOTE — Telephone Encounter (Signed)
 Lorraine Leonard with Hospice returned call, to see if necessary to speak with Device RN regarding patients wound, advised Alan that Device RN's are likely at lunch but can have them return call. Updated Alan on Drew's conversation with Dr. Almetta. She states that that probably is no longer necessary as she has deemed the patient imminent - meaning she can pass at any time. She states that Dr. Ranelle RN's can call if necessary to [phone].

## 2024-05-26 ENCOUNTER — Encounter

## 2024-05-27 NOTE — Telephone Encounter (Signed)
 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  Are you willing to assume that risk?  Yes/no  YES Have Pt unplug monitor/disable APP/etc.  DONE Delete remote appointments  DONE Add note to [employer] DONE Do NOT release in website  ACKNOWLEDGED. WILL WATCH FOR OBITUARY AND THEN REMOVE FROM MONITORING WEBSITE Send message to scheduling advising of next need for appointment (with physician or device clinic)  N/A Send message to [employer] to advise DONE

## 2024-06-23 ENCOUNTER — Ambulatory Visit

## 2024-06-27 ENCOUNTER — Encounter

## 2024-08-16 ENCOUNTER — Ambulatory Visit: Admitting: Pulmonary Disease

## 2024-09-22 ENCOUNTER — Ambulatory Visit

## 2024-12-22 ENCOUNTER — Ambulatory Visit

## 2025-03-23 ENCOUNTER — Ambulatory Visit

## 2025-06-22 ENCOUNTER — Ambulatory Visit

## 2025-09-21 ENCOUNTER — Ambulatory Visit

## 2025-12-21 ENCOUNTER — Ambulatory Visit

## 2026-03-22 ENCOUNTER — Ambulatory Visit
